# Patient Record
Sex: Male | Born: 1945
Health system: Southern US, Community
[De-identification: ages and names within clinical notes are randomized; demographics above are authoritative.]

## PROBLEM LIST (undated history)

## (undated) DIAGNOSIS — I779 Disorder of arteries and arterioles, unspecified: Secondary | ICD-10-CM

## (undated) DIAGNOSIS — I639 Cerebral infarction, unspecified: Secondary | ICD-10-CM

## (undated) DIAGNOSIS — F32A Depression, unspecified: Secondary | ICD-10-CM

## (undated) DIAGNOSIS — C9210 Chronic myeloid leukemia, BCR/ABL-positive, not having achieved remission: Principal | ICD-10-CM

## (undated) DIAGNOSIS — I2510 Atherosclerotic heart disease of native coronary artery without angina pectoris: Secondary | ICD-10-CM

## (undated) DIAGNOSIS — I214 Non-ST elevation (NSTEMI) myocardial infarction: Secondary | ICD-10-CM

## (undated) DIAGNOSIS — I255 Ischemic cardiomyopathy: Secondary | ICD-10-CM

## (undated) DIAGNOSIS — N400 Enlarged prostate without lower urinary tract symptoms: Secondary | ICD-10-CM

## (undated) DIAGNOSIS — J90 Pleural effusion, not elsewhere classified: Secondary | ICD-10-CM

## (undated) DIAGNOSIS — I472 Ventricular tachycardia, unspecified: Secondary | ICD-10-CM

## (undated) DIAGNOSIS — IMO0001 Reserved for inherently not codable concepts without codable children: Secondary | ICD-10-CM

## (undated) DIAGNOSIS — F329 Major depressive disorder, single episode, unspecified: Secondary | ICD-10-CM

## (undated) DIAGNOSIS — I493 Ventricular premature depolarization: Secondary | ICD-10-CM

## (undated) DIAGNOSIS — I739 Peripheral vascular disease, unspecified: Secondary | ICD-10-CM

## (undated) DIAGNOSIS — E785 Hyperlipidemia, unspecified: Secondary | ICD-10-CM

## (undated) DIAGNOSIS — F419 Anxiety disorder, unspecified: Secondary | ICD-10-CM

## (undated) DIAGNOSIS — D630 Anemia in neoplastic disease: Secondary | ICD-10-CM

## (undated) DIAGNOSIS — Z950 Presence of cardiac pacemaker: Secondary | ICD-10-CM

## (undated) DIAGNOSIS — D689 Coagulation defect, unspecified: Secondary | ICD-10-CM

## (undated) DIAGNOSIS — J309 Allergic rhinitis, unspecified: Secondary | ICD-10-CM

## (undated) DIAGNOSIS — D759 Disease of blood and blood-forming organs, unspecified: Secondary | ICD-10-CM

## (undated) DIAGNOSIS — I5022 Chronic systolic (congestive) heart failure: Secondary | ICD-10-CM

## (undated) DIAGNOSIS — J189 Pneumonia, unspecified organism: Secondary | ICD-10-CM

## (undated) DIAGNOSIS — I10 Essential (primary) hypertension: Secondary | ICD-10-CM

## (undated) DIAGNOSIS — A0472 Enterocolitis due to Clostridium difficile, not specified as recurrent: Secondary | ICD-10-CM

## (undated) DIAGNOSIS — G459 Transient cerebral ischemic attack, unspecified: Secondary | ICD-10-CM

## (undated) DIAGNOSIS — J439 Emphysema, unspecified: Secondary | ICD-10-CM

## (undated) DIAGNOSIS — Z95810 Presence of automatic (implantable) cardiac defibrillator: Secondary | ICD-10-CM

## (undated) HISTORY — DX: Ventricular tachycardia: I47.2

## (undated) HISTORY — DX: Transient cerebral ischemic attack, unspecified: G45.9

## (undated) HISTORY — DX: Disorder of arteries and arterioles, unspecified: I77.9

## (undated) HISTORY — DX: Enterocolitis due to Clostridium difficile, not specified as recurrent: A04.72

## (undated) HISTORY — DX: Benign prostatic hyperplasia without lower urinary tract symptoms: N40.0

## (undated) HISTORY — DX: Emphysema, unspecified: J43.9

## (undated) HISTORY — DX: Chronic myeloid leukemia, BCR/ABL-positive, not having achieved remission: C92.10

## (undated) HISTORY — DX: Anemia in neoplastic disease: D63.0

## (undated) HISTORY — DX: Ventricular tachycardia, unspecified: I47.20

## (undated) HISTORY — DX: Ventricular premature depolarization: I49.3

## (undated) HISTORY — DX: Depression, unspecified: F32.A

## (undated) HISTORY — DX: Major depressive disorder, single episode, unspecified: F32.9

## (undated) HISTORY — PX: BACK SURGERY: SHX140

## (undated) HISTORY — DX: Anxiety disorder, unspecified: F41.9

## (undated) HISTORY — DX: Essential (primary) hypertension: I10

## (undated) HISTORY — DX: Allergic rhinitis, unspecified: J30.9

## (undated) HISTORY — DX: Ischemic cardiomyopathy: I25.5

## (undated) HISTORY — DX: Hyperlipidemia, unspecified: E78.5

## (undated) HISTORY — DX: Chronic systolic (congestive) heart failure: I50.22

## (undated) HISTORY — DX: Atherosclerotic heart disease of native coronary artery without angina pectoris: I25.10

## (undated) HISTORY — DX: Peripheral vascular disease, unspecified: I73.9

---

## 1998-06-22 ENCOUNTER — Ambulatory Visit (HOSPITAL_COMMUNITY): Admission: RE | Admit: 1998-06-22 | Discharge: 1998-06-22 | Payer: Self-pay | Admitting: Family Medicine

## 1999-08-09 DIAGNOSIS — I639 Cerebral infarction, unspecified: Secondary | ICD-10-CM

## 1999-08-09 HISTORY — DX: Cerebral infarction, unspecified: I63.9

## 1999-08-09 HISTORY — PX: CAROTID ENDARTERECTOMY: SUR193

## 2000-02-11 ENCOUNTER — Encounter: Payer: Self-pay | Admitting: Vascular Surgery

## 2000-02-14 ENCOUNTER — Encounter (INDEPENDENT_AMBULATORY_CARE_PROVIDER_SITE_OTHER): Payer: Self-pay

## 2000-02-14 ENCOUNTER — Inpatient Hospital Stay: Admission: RE | Admit: 2000-02-14 | Discharge: 2000-02-15 | Payer: Self-pay | Admitting: Vascular Surgery

## 2001-08-08 HISTORY — PX: OTHER SURGICAL HISTORY: SHX169

## 2003-03-06 ENCOUNTER — Encounter: Payer: Self-pay | Admitting: Cardiology

## 2003-03-06 ENCOUNTER — Observation Stay (HOSPITAL_COMMUNITY): Admission: EM | Admit: 2003-03-06 | Discharge: 2003-03-06 | Payer: Self-pay | Admitting: *Deleted

## 2004-10-19 ENCOUNTER — Encounter: Admission: RE | Admit: 2004-10-19 | Discharge: 2004-10-19 | Payer: Self-pay | Admitting: Family Medicine

## 2009-02-05 HISTORY — PX: CORONARY ARTERY BYPASS GRAFT: SHX141

## 2009-02-18 ENCOUNTER — Ambulatory Visit (HOSPITAL_COMMUNITY): Admission: RE | Admit: 2009-02-18 | Discharge: 2009-02-18 | Payer: Self-pay | Admitting: Cardiology

## 2009-02-19 ENCOUNTER — Ambulatory Visit: Payer: Self-pay | Admitting: Thoracic Surgery (Cardiothoracic Vascular Surgery)

## 2009-02-20 ENCOUNTER — Encounter: Payer: Self-pay | Admitting: Thoracic Surgery (Cardiothoracic Vascular Surgery)

## 2009-02-20 ENCOUNTER — Ambulatory Visit (HOSPITAL_COMMUNITY)
Admission: RE | Admit: 2009-02-20 | Discharge: 2009-02-20 | Payer: Self-pay | Admitting: Thoracic Surgery (Cardiothoracic Vascular Surgery)

## 2009-02-23 ENCOUNTER — Ambulatory Visit: Payer: Self-pay | Admitting: Thoracic Surgery (Cardiothoracic Vascular Surgery)

## 2009-02-23 ENCOUNTER — Inpatient Hospital Stay (HOSPITAL_COMMUNITY)
Admission: RE | Admit: 2009-02-23 | Discharge: 2009-02-28 | Payer: Self-pay | Admitting: Thoracic Surgery (Cardiothoracic Vascular Surgery)

## 2009-03-20 ENCOUNTER — Ambulatory Visit: Payer: Self-pay | Admitting: Thoracic Surgery (Cardiothoracic Vascular Surgery)

## 2009-03-20 ENCOUNTER — Encounter
Admission: RE | Admit: 2009-03-20 | Discharge: 2009-03-20 | Payer: Self-pay | Admitting: Thoracic Surgery (Cardiothoracic Vascular Surgery)

## 2009-04-03 ENCOUNTER — Ambulatory Visit: Payer: Self-pay | Admitting: Thoracic Surgery (Cardiothoracic Vascular Surgery)

## 2009-04-03 ENCOUNTER — Encounter
Admission: RE | Admit: 2009-04-03 | Discharge: 2009-04-03 | Payer: Self-pay | Admitting: Thoracic Surgery (Cardiothoracic Vascular Surgery)

## 2010-07-08 HISTORY — PX: COLONOSCOPY: SHX174

## 2010-11-14 LAB — CBC
HCT: 27.9 % — ABNORMAL LOW (ref 39.0–52.0)
HCT: 31.3 % — ABNORMAL LOW (ref 39.0–52.0)
HCT: 36.4 % — ABNORMAL LOW (ref 39.0–52.0)
Hemoglobin: 10.9 g/dL — ABNORMAL LOW (ref 13.0–17.0)
Hemoglobin: 12.8 g/dL — ABNORMAL LOW (ref 13.0–17.0)
Hemoglobin: 9.7 g/dL — ABNORMAL LOW (ref 13.0–17.0)
MCHC: 34.7 g/dL (ref 30.0–36.0)
MCHC: 34.7 g/dL (ref 30.0–36.0)
MCV: 97.6 fL (ref 78.0–100.0)
MCV: 99 fL (ref 78.0–100.0)
Platelets: 148 10*3/uL — ABNORMAL LOW (ref 150–400)
Platelets: 164 10*3/uL (ref 150–400)
Platelets: 172 10*3/uL (ref 150–400)
RBC: 2.46 MIL/uL — ABNORMAL LOW (ref 4.22–5.81)
RBC: 3.73 MIL/uL — ABNORMAL LOW (ref 4.22–5.81)
RDW: 13 % (ref 11.5–15.5)
RDW: 13 % (ref 11.5–15.5)
RDW: 13.4 % (ref 11.5–15.5)
WBC: 14.1 10*3/uL — ABNORMAL HIGH (ref 4.0–10.5)
WBC: 14.1 10*3/uL — ABNORMAL HIGH (ref 4.0–10.5)
WBC: 8.5 10*3/uL (ref 4.0–10.5)

## 2010-11-14 LAB — APTT: aPTT: 33 seconds (ref 24–37)

## 2010-11-14 LAB — COMPREHENSIVE METABOLIC PANEL
AST: 21 U/L (ref 0–37)
BUN: 14 mg/dL (ref 6–23)
CO2: 26 mEq/L (ref 19–32)
Chloride: 105 mEq/L (ref 96–112)
Creatinine, Ser: 1.03 mg/dL (ref 0.4–1.5)
GFR calc Af Amer: >60 mL/min (ref >60)
GFR calc non Af Amer: >60 mL/min (ref >60)
Glucose, Bld: 92 mg/dL (ref 70–99)
Total Bilirubin: 0.4 mg/dL (ref 0.3–1.2)

## 2010-11-14 LAB — BASIC METABOLIC PANEL
BUN: 13 mg/dL (ref 6–23)
BUN: 20 mg/dL (ref 6–23)
CO2: 27 mEq/L (ref 19–32)
Calcium: 8.5 mg/dL (ref 8.4–10.5)
Calcium: 8.7 mg/dL (ref 8.4–10.5)
Creatinine, Ser: 1.06 mg/dL (ref 0.4–1.5)
Creatinine, Ser: 1.11 mg/dL (ref 0.4–1.5)
Creatinine, Ser: 1.16 mg/dL (ref 0.4–1.5)
GFR calc Af Amer: >60 mL/min (ref >60)
GFR calc Af Amer: >60 mL/min (ref >60)
GFR calc non Af Amer: >60 mL/min (ref >60)
GFR calc non Af Amer: >60 mL/min (ref >60)
GFR calc non Af Amer: >60 mL/min (ref >60)
Glucose, Bld: 118 mg/dL — ABNORMAL HIGH (ref 70–99)
Potassium: 4.6 mEq/L (ref 3.5–5.1)
Sodium: 140 mEq/L (ref 135–145)
Sodium: 143 mEq/L (ref 135–145)

## 2010-11-14 LAB — HEMOGLOBIN A1C: Hgb A1c MFr Bld: 5.5 % (ref 4.6–6.1)

## 2010-11-14 LAB — POCT I-STAT 3, ART BLOOD GAS (G3+)
Acid-Base Excess: 1 mmol/L (ref 0.0–2.0)
Acid-Base Excess: 3 mmol/L — ABNORMAL HIGH (ref 0.0–2.0)
Acid-base deficit: 2 mmol/L (ref 0.0–2.0)
Acid-base deficit: 2 mmol/L (ref 0.0–2.0)
Bicarbonate: 23.2 mEq/L (ref 20.0–24.0)
Bicarbonate: 23.9 mEq/L (ref 20.0–24.0)
Bicarbonate: 27.1 mEq/L — ABNORMAL HIGH (ref 20.0–24.0)
O2 Saturation: 100 %
O2 Saturation: 96 %
Patient temperature: 36.9
TCO2: 24 mmol/L (ref 0–100)
TCO2: 29 mmol/L (ref 0–100)
pCO2 arterial: 42.5 mmHg (ref 35.0–45.0)
pCO2 arterial: 40.8 mmHg (ref 35.0–45.0)
pH, Arterial: 7.365 (ref 7.350–7.450)
pO2, Arterial: 64 mmHg — ABNORMAL LOW (ref 80.0–100.0)
pO2, Arterial: 59 mmHg — ABNORMAL LOW (ref 80.0–100.0)

## 2010-11-14 LAB — URINALYSIS, ROUTINE W REFLEX MICROSCOPIC
Glucose, UA: NEGATIVE mg/dL
Hgb urine dipstick: NEGATIVE
Protein, ur: NEGATIVE mg/dL

## 2010-11-14 LAB — POCT I-STAT 4, (NA,K, GLUC, HGB,HCT)
Glucose, Bld: 102 mg/dL — ABNORMAL HIGH (ref 70–99)
Glucose, Bld: 107 mg/dL — ABNORMAL HIGH (ref 70–99)
Glucose, Bld: 108 mg/dL — ABNORMAL HIGH (ref 70–99)
HCT: 31 % — ABNORMAL LOW (ref 39.0–52.0)
HCT: 31 % — ABNORMAL LOW (ref 39.0–52.0)
HCT: 31 % — ABNORMAL LOW (ref 39.0–52.0)
Hemoglobin: 10.5 g/dL — ABNORMAL LOW (ref 13.0–17.0)
Hemoglobin: 7.8 g/dL — CL (ref 13.0–17.0)
Potassium: 3.5 mEq/L (ref 3.5–5.1)
Potassium: 3.6 mEq/L (ref 3.5–5.1)
Potassium: 5.1 mEq/L (ref 3.5–5.1)
Sodium: 136 mEq/L (ref 135–145)
Sodium: 136 mEq/L (ref 135–145)
Sodium: 141 mEq/L (ref 135–145)
Sodium: 142 mEq/L (ref 135–145)

## 2010-11-14 LAB — MAGNESIUM
Magnesium: 2.1 mg/dL (ref 1.5–2.5)
Magnesium: 2.2 mg/dL (ref 1.5–2.5)
Magnesium: 2.2 mg/dL (ref 1.5–2.5)
Magnesium: 2.5 mg/dL (ref 1.5–2.5)

## 2010-11-14 LAB — GLUCOSE, CAPILLARY
Glucose-Capillary: 100 mg/dL — ABNORMAL HIGH (ref 70–99)
Glucose-Capillary: 120 mg/dL — ABNORMAL HIGH (ref 70–99)
Glucose-Capillary: 145 mg/dL — ABNORMAL HIGH (ref 70–99)
Glucose-Capillary: 149 mg/dL — ABNORMAL HIGH (ref 70–99)
Glucose-Capillary: 85 mg/dL (ref 70–99)
Glucose-Capillary: 97 mg/dL (ref 70–99)

## 2010-11-14 LAB — BLOOD GAS, ARTERIAL
Acid-Base Excess: 3.5 mmol/L — ABNORMAL HIGH (ref 0.0–2.0)
O2 Saturation: 96.7 %
TCO2: 28.2 mmol/L (ref 0–100)
pCO2 arterial: 37.6 mmHg (ref 35.0–45.0)
pO2, Arterial: 77.6 mmHg — ABNORMAL LOW (ref 80.0–100.0)

## 2010-11-14 LAB — TYPE AND SCREEN
ABO/RH(D): A POS
Antibody Screen: NEGATIVE

## 2010-11-14 LAB — POCT I-STAT 3, VENOUS BLOOD GAS (G3P V)
Acid-Base Excess: 4 mmol/L — ABNORMAL HIGH (ref 0.0–2.0)
pCO2, Ven: 45.3 mmHg (ref 45.0–50.0)
pCO2, Ven: 46 mmHg (ref 45.0–50.0)
pO2, Ven: 34 mmHg (ref 30.0–45.0)
pO2, Ven: 35 mmHg (ref 30.0–45.0)

## 2010-11-14 LAB — POCT I-STAT, CHEM 8
BUN: 11 mg/dL (ref 6–23)
BUN: 17 mg/dL (ref 6–23)
Calcium, Ion: 1.27 mmol/L (ref 1.12–1.32)
Chloride: 108 mEq/L (ref 96–112)
Glucose, Bld: 116 mg/dL — ABNORMAL HIGH (ref 70–99)
HCT: 26 % — ABNORMAL LOW (ref 39.0–52.0)
HCT: 26 % — ABNORMAL LOW (ref 39.0–52.0)
Potassium: 4 mEq/L (ref 3.5–5.1)
TCO2: 20 mmol/L (ref 0–100)

## 2010-11-14 LAB — CREATININE, SERUM
Creatinine, Ser: 1.24 mg/dL (ref 0.4–1.5)
GFR calc Af Amer: >60 mL/min (ref >60)
GFR calc non Af Amer: 59 mL/min — ABNORMAL LOW (ref >60)
GFR calc non Af Amer: >60 mL/min (ref >60)

## 2010-11-14 LAB — PROTIME-INR: Prothrombin Time: 15.8 seconds — ABNORMAL HIGH (ref 11.6–15.2)

## 2010-11-14 LAB — ABO/RH: ABO/RH(D): A POS

## 2010-12-21 NOTE — Assessment & Plan Note (Signed)
OFFICE VISIT   Anthony Wolfe, Anthony Wolfe  DOB:  Jul 16, 1946                                        April 03, 2009  CHART #:  [id number]   The patient is a 65 year old gentleman who underwent coronary bypass  grafting x3 in July 19 for three-vessel disease with progressive angina.  He was seen back in the office on the 13th at which time he was doing  well, but he did have a small left pleural effusion.  We treated him  with a steroid taper and he now returns with a 2-week follow up chest x-  ray.  He states that he continues to do well.  He is 6 weeks out from  his surgery as of next Monday.  He has been physically active.  He  walked 2 miles this morning without any difficulty.  He does say that if  he walks up an incline, he will get short of breath but 2 miles on level  ground was not a problem.  He has not had any orthopnea and no  peripheral edema and no cough.   PHYSICAL EXAMINATION:  The patient is a well-appearing 65 year old male  in no acute distress.  His blood pressure is 111/72, pulse 64,  respirations 18, his oxygen saturation is 94% on room air.  Sternum is  stable.  Sternal incision is intact.  He has essentially equal breath  sounds bilaterally.   Chest x-ray shows a very small left pleural effusion decreased in size  from 2 weeks ago.   IMPRESSION:  The patient is doing well at this point in time.  His  activities were once again discussed, he can start gradually building  into activities.  I advised him not to lift any objects weighing greater  than 20 pounds for another 2 weeks after that it is unrestricted, but  again to build into things gradually.  He is driving.  He will continued  to be followed by Dr. Arlyce Dice and  Dr. Amil Amen.  I would be happy to see him back anytime if I could be of  any further assistance with his care.   Salvatore Decent Dorris Fetch, M.D.  Electronically Signed   SCH/MEDQ  D:  04/03/2009  T:  04/03/2009  Job:  [id number]   cc:   Teena Irani. Arlyce Dice, M.D.

## 2010-12-21 NOTE — H&P (Signed)
HISTORY AND PHYSICAL EXAMINATION   February 19, 2009   Re:  ANAND, TEJADA            DOB:  06/20/1946   REASON FOR CONSULTATION:  Left main disease.   HISTORY OF PRESENT ILLNESS:  The patient is a 65 year old gentleman with  multiple cardiac risk factors and known coronary artery disease.  He had  an MRI and occluded right coronary catheterization in 2004.  He had a  40% left main stenosis at that time.  He had a history also of left  ventricular ejection fraction of 36% with ischemic cardiomyopathy.  He  had a stress test as recently as 2007 which showed no reversible defect.   The patient now has a chief complaint of shortness of breath with  exertion.  He does get some chest tightness, it becomes sometimes with  or sometimes without exertion.  He describes it as a tightness  substernally.  It is associated with shortness of breath.  Sometimes he  will get short of breath exertion without a coming chest tightness.  He  has been exercising regularly, walking up to 3 miles a day as recently  as a few weeks ago but he has noted over the past month or two that he  has been getting increasingly fatigued.  He feels tired all the time  with heavy exertion.  He will just give out and then takes him a long  time to recover and has been having increasing duration in severity of  his chest tightness and shortness of breath.   A pharmacologic stress test was done by Dr. Amil Amen, which showed in  addition to a fixed inferior wall defect.  There also was a moderate  reversible defect in the mid basal and mid inferior walls.  Yesterday he  underwent cardiac catheterization where he was found to have total  occlusion of his right coronary which is chronic filled via the left-to-  right collaterals.  He also had a 50% distal left main stenosis.   His past medical history is significant for coronary artery disease;  previous MI; and chronic totally occluded right coronary;  atherosclerotic cardiovascular disease; PAD with mild claudication;  previous stroke; known totally occluded left carotid; previous right  carotid endarterectomy by Dr. Tawanna Cooler Early in 2001, patent as of 2 years  ago; hypertension; hyperlipidemia; depression; and arthritis.   His current medications are citalopram 20 mg daily,  lisinopril/hydrochlorothiazide 20/12.5 one tablet daily, aspirin 325 mg  daily, metoprolol 25 mg b.i.d., simvastatin 40 mg daily, fish oil 1000  mg daily, Levitra 20 mg p.r.n., Os-Cal plus D 600/200 one tablet b.i.d.   He has an allergy to sulfa.   FAMILY HISTORY:  Significant for hypertension, coronary artery disease,  but not premature coronary artery disease.   SOCIAL HISTORY:  He is married.  He is retired, lives with his wife.  He  has a 120 pack-year history of smoking, but quit 12 years ago.  He does  not drink alcohol.   REVIEW OF SYSTEMS:  The patient's medical history form is reviewed and  is on the chart.  He notes in addition to his chest pain and shortness  of breath, he does have some orthopnea but no paroxysmal nocturnal  dyspnea.  He has had a stroke.  He gets blurred vision in his left eye  when he is hot, this has been ongoing since his stroke.  He does have  dizziness with exertion, also has arthritis, some  depression.  He gets  pain in his calves, left greater than right with walking but was walking  3 miles as recently as a month ago.   LABORATORY DATA:  EKG shows poor R-wave progression as well as previous  inferior MI stressed back in catheterization as previously noted.  His  glucose was 98, BUN 13, creatinine 1.1, sodium 142, potassium 4.3.  White count 9.2, hematocrit 39, platelets 251.  PT is 14.1.   IMPRESSION:  The patient is a 65 year old gentleman with known coronary  artery disease, multiple cardiac risk factors, who presents with  increasing exertional shortness of breath and chest discomfort as well  as increasing fatigue and  lack of energy.  At catheterization, he has  chronically totally occluded right coronary as well as progression of  his left main disease which is now 50%.  He also has some other plaque  burden in his vessels, but not enough to reach hemodynamic significance.  I had a discussion with the patient and his wife regarding the  implications of left main disease in the same particularly in the  setting of totally occluded right coronary and with his progression of  symptoms, I would favor a treatment of left main disease with coronary  artery bypass grafting.  I discussed in detail with them the  indications, risks, benefits, and alternatives.  They do understand  there is a good possibility that this will not resolve all of his  symptoms, but should help at least partially with most of the symptoms.  They also understand that there is no guarantee of success and there is  a possibility of progression over time either with native coronary  artery disease or graft atherosclerosis.  I discussed with them the  risks which include but not limited to death, myocardial infarction,  stroke, deep vein thrombosis, pulmonary embolism, bleeding, possible  need for transfusions, infections as well as other organ system  dysfunction including respiratory, renal, hepatic, or gastrointestinal  complications.  He does understand that he is in increased risk for  respiratory complications given his smoking history and chronic  obstructive pulmonary disease, although given his exercise tolerance  until recently and; in fact, he does not use nebulizers or oxygen.  I  think his risk is acceptable from a pulmonary standpoint.  The patient  understands and accepts, and agrees to the procedure and wishes to  proceed as soon as possible.  We have scheduled him for Monday, February 23, 2009.  He will be admitted on the day of surgery.   Salvatore Decent Dorris Fetch, M.D.  Electronically Signed   SCH/MEDQ  D:  02/19/2009  T:   02/20/2009  Job:  [id number]   cc:   Francisca December, M.D.  Teena Irani. Arlyce Dice, M.D.

## 2010-12-21 NOTE — Assessment & Plan Note (Signed)
OFFICE VISIT   BANDY, HONAKER  DOB:  02-May-1946                                        March 20, 2009  CHART #:  [id number]   REASON FOR VISIT:  Follow up after coronary bypass grafting.   HISTORY OF PRESENT ILLNESS:  The patient is a 65 year old gentleman who  was found to have a left main stenosis and totally occluded right  coronary back in July.  He underwent coronary bypass grafting x3 on July  19.  Postoperatively he did well, did not have any significant  complications, and he has continued to do well since he was discharged  home.  He is having minimal discomfort.  He took a couple of pain pills  at home, stopped taking them and has not had any since.  He has not had  any anginal-type pain.  He has been walking, he walked half a mile this  morning without any difficulty.  His energy levels are improving.  His  blood pressure was low last week, he saw someone in Dr. Amil Amen' office  and his medications were changed.  He says he has felt better since that  time.   CURRENT MEDICATIONS:  Citalopram 20 mg daily, aspirin 325 mg daily,  metoprolol 25 mg b.i.d., simvastatin 40 mg daily, __________ 20 mg  p.r.n., lisinopril and hydrochlorothiazide was discontinued.   PHYSICAL EXAMINATION:  GENERAL:  The patient is a well appearing, 63-  year-old white male in no acute distress.  VITAL SIGNS:  Blood pressure is 113/67, pulse 69, respirations 18, his  oxygen saturation is 90% on room air.  LUNGS:  Clear with equal breath sounds bilaterally.  CARDIAC:  Regular rate and rhythm.  Normal S1 and S2.  No rubs, murmurs  or gallops.  CHEST:  His sternum is stable.  His sternal incision is well healed.  EXTREMITIES:  His leg incision is well healed.  He has no peripheral  edema.   Chest x-ray shows some small amount of effusion and atelectasis at the  left base.  There is not enough fluid there for thoracentesis.   IMPRESSION:  The patient is a 65 year old  gentleman, he is status post  coronary bypass grafting about 3-1/2 weeks ago.  He is doing very well  at this point in time.  He is having minimal discomfort.  His exercise  tolerance is good and he is very motivated.  He does have a small left  effusion.  His diuretics were stopped because of hypotension, I do not  want to put him back on a diuretic but I am going to give him a  prednisone taper for that.  I will plan to see him back in about 2 weeks  with a repeat chest x-ray just to check on his progress.  He may begin  driving.  He is still not to lift any objects weighing greater than 10  pounds for at least another 2 weeks.  As stated, I will plan to see him  back at that time.   Salvatore Decent Dorris Fetch, M.D.  Electronically Signed   SCH/MEDQ  D:  03/20/2009  T:  03/20/2009  Job:  [id number]   cc:   Teena Irani. Arlyce Dice, M.D.

## 2010-12-21 NOTE — Discharge Summary (Signed)
NAME:  Anthony Wolfe, Anthony Wolfe NO.:  [id number]   MEDICAL RECORD NO.:  [id number]          PATIENT TYPE:  INP   LOCATION:  2017                         FACILITY:  [facility]   PHYSICIAN:  Salvatore Decent. Dorris Fetch, M.D.DATE OF BIRTH:  [birth year]   DATE OF ADMISSION:  02/23/2009  DATE OF DISCHARGE:                               DISCHARGE SUMMARY   FINAL DIAGNOSIS:  Left main and 3-vessel coronary artery disease with  progressive angina.   IN-HOSPITAL DIAGNOSES:  1. Acute blood loss anemia postoperatively.  2. Volume overload postoperatively.   SECONDARY DIAGNOSES:  1. History of coronary artery disease status post myocardial      infarction with chronic totally occluded right coronary artery.  2. Atherosclerotic cardiovascular disease.  3. Peripheral vascular disease with mild claudication.  4. History of cerebrovascular accident with known totally occluded      left carotid artery.  5. Status post right carotid endarterectomy done by Dr. Arbie Cookey in 2001.  6. Hypertension.  7. Hyperlipidemia.  8. Depression.  9. Arthritis.   IN-HOSPITAL OPERATIONS AND PROCEDURES:  Coronary artery bypass grafting  x3 using a left internal mammary artery to left anterior descending,  saphenous vein graft to obtuse marginal 1, saphenous vein graft to  posterior descending, and endoscopic vein harvesting from left leg.   HISTORY AND PHYSICAL AND HOSPITAL COURSE:  Mr. Hardgrove is a 65 year old  gentleman with known coronary artery disease who presents with 1 to 2-  month history of progressive exertional dyspnea and chest discomfort.  After cardiac catheterization, he was found to have chronically totally  occluded right coronary artery and a 50% left main stenosis.  The  patient was seen and evaluated by Dr. Dorris Fetch.  Dr. Dorris Fetch  discussed with the patient undergoing coronary artery bypass grafting.  He discussed risks and benefits with the patient.  The patient  acknowledged  understanding and agreed to proceed.  Surgery was scheduled  for February 23, 2009.  For details of the patient's past medical and  history and physical exam, please see dictated H and P.   The patient was taken to the operating room on February 23, 2009, where he  underwent coronary artery bypass grafting x3 using a left internal  mammary artery to left anterior descending, saphenous vein graft to  obtuse marginal 1, and saphenous vein graft to posterior descending.  Endoscopic vein harvesting was done from the left leg.  The patient  tolerated this procedure well and was transferred to the Intensive Care  Unit in stable condition.  Postoperatively, the patient noted to be  hemodynamically stable.  He was extubated evening of surgery.  Post-  extubation, the patient was noted to be alert and oriented x4 and neuro  intact.  The patient's postoperative course was pretty much  unremarkable.  He was noted to be in normal sinus rhythm  postoperatively.  Blood pressure stable.  All drips were weaned and  discontinued.  The patient was restarted on his lisinopril/HCTZ as well  as low-dose beta-blocker.  The patient remained in normal sinus rhythm.  Blood pressure remained stable.  Postoperatively, chest  x-ray obtained  day 1 was stable.  The patient had minimal drainage from chest tubes and  chest tubes were discontinued in normal fashion.  The patient had been  placed on nasal cannula post extubation.  He was encouraged to use his  incentive spirometer.  Follow up chest x-ray remained stable.  The  patient was able to be weaned and extubated from O2 prior to discharge  to home.  Postoperatively, the patient did have some mild acute blood  loss anemia.  His hemoglobin and hematocrit were followed closely.  He  did not require any blood transfusions postoperatively.  Most recent  hemoglobin and hematocrit were 8.8 and 25.6.  The patient had some mild  volume overload postoperatively.  He was started on  diuretics.  Daily  weights were obtained.  The patient remains 4 kg above his preoperative  weight.   The patient was transferred out of the Intensive Care Unit to [facility] postop  day #2.  He was up ambulating well with cardiac rehab and on his own.  He was tolerating diet well.  All incisions were noted to be clean, dry,  and intact and healing well.  On postop day #3, February 26, 2009, the  patient is noted to be afebrile.  He remains in normal sinus rhythm.  Blood pressure stable.  He was weaned off oxygen saturating greater than  90% on room air.  His most recent lab work shows white blood cell count  12.0, hemoglobin of 8.8, hematocrit 25.6, and platelet count 172.  Sodium of 140, potassium 3.5, chloride of 103, bicarbonate 29, BUN of  20, creatinine 1.16, and glucose 118.  AP and lateral chest x-ray done  today showed small bilateral pleural effusions.  The patient is  tentatively ready for discharge home in the next 24-48 hours pending, if  he remained stable.   FOLLOWUP APPOINTMENTS:  A followup appointment has been arranged with  Dr. Dorris Fetch for March 20, 2009, at 11:45 a.m.  The patient will  need to obtain AP and lateral chest x-ray 30 minutes prior to this  appointment.  The patient will need to follow up with Dr. Amil Amen in 2  weeks.  He will need to contact his office to make these arrangements.   ACTIVITY:  The patient instructed no driving until released to do so, no  heavy lifting over 10 pounds.  He is told to ambulate 3-4 times per day,  progress as tolerated.   INCISIONAL CARE:  The patient is told to shower, washing his incisions  using soap and water.  He is to contact the office if he develops any  drainage or opening from any of his incision sites.   DIET:  The patient educated on diet to be low-fat, low-salt.   DISCHARGE MEDICATIONS:  1. Celexa 20 mg daily.  2. Lisinopril/HCTZ 20/25 mg daily.  3. Aspirin 325 mg daily.  4. Simvastatin 40 mg daily.  5. Fish  oil 1000 mg 3 tablets daily.  6. Calcium and vitamin D daily.  7. Lopressor 12.5 mg b.i.d.  8. Lasix 40 mg daily x1 week.  9. Potassium chloride 20 mEq daily x1 week.  10.Oxycodone 5 mg 1-2 tabs q.4-6 h p.r.n. pain.      Sol Blazing, PA      Salvatore Decent. Dorris Fetch, M.D.  Electronically Signed    KMD/MEDQ  D:  02/26/2009  T:  02/27/2009  Job:  [id number]   cc:   Francisca December, M.D.  Teena Irani. Arlyce Dice, M.D.

## 2010-12-21 NOTE — Op Note (Signed)
NAME:  Anthony Wolfe, Anthony Wolfe NO.:  [id number]   MEDICAL RECORD NO.:  [id number]          PATIENT TYPE:  INP   LOCATION:  2308                         FACILITY:  [facility]   PHYSICIAN:  Salvatore Decent. Dorris Fetch, M.D.DATE OF BIRTH:  22-Sep-1945   DATE OF PROCEDURE:  02/23/2009  DATE OF DISCHARGE:                               OPERATIVE REPORT   PREOPERATIVE DIAGNOSIS:  Left main and three-vessel coronary disease  with progressive angina.   POSTOPERATIVE DIAGNOSIS:  Left main and three-vessel coronary disease  with progressive angina.   PROCEDURES:  Median sternotomy, extracorporeal circulation, coronary  bypass grafting x3 (left internal mammary artery to left anterior  descending, saphenous vein graft obtuse marginal 1, saphenous vein graft  to posterior descending), and endoscopic vein harvest, left leg.   SURGEON:  Salvatore Decent. Dorris Fetch, MD   ASSISTANT:  Stephanie Acre. Dasovich, PA   ANESTHESIA:  General.   FINDINGS:  Posterior descending branch of the right coronary 1-mm fair  quality vessel.  OM-1 high.  Anterolateral branch 1.5, good quality.  LAD 1.5, good quality.  Mild plaquing of the LAD just proximal to the  anastomosis.   Left internal mammary artery, good-quality.  Saphenous vein from the  left leg, good quality.  Saphenous vein and right leg, bifurcated mid  thigh, not harvested.   CLINICAL NOTE:  Anthony Wolfe is a 65 year old gentleman with known  coronary disease who presents with a 1-2 month history of progressive  exertional dyspnea and chest discomfort.  At catheterization, he has a  chronically totally occluded right coronary and a 50% left main  stenosis.  The patient was advised to undergo coronary artery bypass  grafting.  The indications, risks, benefits, and alternatives were  discussed in detail with the patient.  He understood and accepted the  risks and agreed to proceed.   OPERATIVE NOTE:  Anthony Wolfe was brought to the preop holding area on  February 23, 2009.  There the Anesthesia Service under direction of Dr.  Sheldon Silvan placed a Swan-Ganz catheter and arterial blood pressure  monitoring lines as well as establishing intravenous access.  Intravenous antibiotics were administered.  The patient was taken to the  operating room, anesthetized, and intubated.  A Foley catheter was  placed.  The chest, abdomen, and legs were prepped and draped in the  usual fashion.  An incision was made in the medial aspect of the right  leg at the level where the greater saphenous vein was identified.  Initial attempted dissections were performed, but the vein bifurcated  mid thigh and became relatively small in both branches.  Decision was  made to inspect the vein on the left side, which turned out to be a  better quality vein.  The right leg vein was not harvested.   The saphenous vein was harvested from the left leg from upper calf to  groin endoscopically.  Simultaneously, a median sternotomy was performed  and the left internal mammary artery was harvested using a standard  technique.  A 2000 units of heparin was administered during the vessel  harvest and remaining full  heparin dose was given prior to opening the  pericardium.   The pericardium was opened.  The ascending aorta was inspected.  There  was no atherosclerotic disease.  The aorta was cannulated via concentric  2-0 Ethibond pledgeted purse-string sutures.  A dual-stage venous  cannula was placed via purse-string suture in the right atrial  appendage.  Cardiopulmonary bypass was instituted and the patient was  cooled to 32 degrees Celsius.  The coronary arteries were inspected and  anastomotic sites were chosen.  The conduits were inspected and cut to  length.  A foam pad was placed in the pericardium to insulate the heart.  A temperature probe was placed in the myocardial septum and a  cardioplegic cannula was placed in the ascending aorta.   The aorta was cross-clamped.  The  left ventricle was emptied via the  aortic root vent.  Cardiac arrest was achieved with combination of cold  antegrade blood cardioplegia and topical iced saline.  A 1 L of  cardioplegia was administered.  The myocardial temperature fell to 10  degrees Celsius and the following distal anastomoses were performed.   First, reverse saphenous vein graft was placed end-to-side to the  posterior descending branch of the right coronary.  The vein graft was  of good quality.  The coronary was a 1-mm fair quality target.  Probe  passed easily proximally and distally.  At the completion anastomosis,  cardioplegia was administered.  Flow was adequate and as expected with a  small target vessel.  There was good hemostasis.   Next, a reverse saphenous vein graft was placed end-to-side to the first  obtuse marginal branch of the left circumflex.  This was a high  anterolateral branch, almost an intermediate branch.  There was a 1.5-mm  good-quality target.  The vein graft was of good quality.  It was  anastomosed end-to-side with a running 7-0 Prolene suture.  Again, a  probe passed easily proximally and distally.  Cardioplegia was  administered.  There was good flow and good hemostasis.   Next, the left internal mammary artery was brought through a window in  the pericardium.  The distal end was beveled and was anastomosed end-to-  side to the distal LAD.  The mammary was a 2-mm good-quality conduit.  The LAD was a 1.5-mm good-quality target.  The anastomosis was performed  with a running 8-0 Prolene suture in an end-to-side fashion.  At the  completion of the mammary to LAD anastomosis, the bulldog clamps were  briefly removed to inspect for hemostasis.  Immediate and rapid septal  rewarming was noted.  The bulldog clamps were placed and the mammary  pedicle was tacked to the epicardial surface of the heart with 6-0  Prolene sutures.  Additional cardioplegia was administered.   The vein grafts  were cut to length.  The cardioplegic cannulas were  removed from the ascending aorta.  The proximal vein graft anastomoses  were performed with 4.5 mm punch aortotomies with running 6-0 Prolene  sutures placed in the final proximal anastomosis.  The patient was  placed in Trendelenburg position.  Lidocaine was administered.  The  aortic root was de-aired and the aortic cross-clamp was removed.  Total  cross-clamp time was 58 minutes.  The patient required defibrillation  with 10 joules and then was in sinus rhythm thereafter.  While the  patient was being rewarmed all proximal and distal anastomoses were  inspected for hemostasis.  Epicardial pacing wires were placed on the  right ventricle and right atrium.  When the patient rewarmed to a core  temperature of 37 degrees Celsius, he was weaned from cardiopulmonary  bypass on the first attempt.  He was paced at 90 beats per minute and on  no inotropic support.  Total bypass time was 89 minutes.  The initial  cardiac index was greater than 2 L/min/m2.  The patient remained  hemodynamically stable throughout the post-bypass period.   Test dose of protamine was administered and was well tolerated.  The  atrial and aortic cannulae were removed.  The remaining of the protamine  was administered without incident.  The chest irrigated with 1 L of warm  normal saline including 1 gram of vancomycin and hemostasis was  achieved.  The left pleural and single mediastinal chest tubes were  placed through separate subcostal incisions.  The pericardium was  reapproximated with interrupted 3-0 silk sutures that came together  easily without tension.  The sternum was closed with a combination of  single and double heavy gauge stainless steel wires.  The pectoralis  fascia, subcutaneous tissue, and skin were closed in standard  fashion.  All sponge, needle, and instrument counts were correct at the  end of procedure.  There were no intraoperative  complications and the  patient was taken from the operating room to the Surgical Intensive Care  Unit in stable condition.      Salvatore Decent Dorris Fetch, M.D.  Electronically Signed     SCH/MEDQ  D:  02/23/2009  T:  02/24/2009  Job:  [id number]   cc:   Francisca December, M.D.  Teena Irani. Arlyce Dice, M.D.

## 2010-12-24 NOTE — Discharge Summary (Signed)
[facility]  Patient:    Anthony Wolfe, Anthony Wolfe                         MRN: [id number] Adm. Date:  02/14/00 Disc. Date: 02/15/00 Attending:  Larina Earthly, M.D. Dictator:   Loura Pardon, P.A. CC:         Dr. Raquel Sarna, [address]*[place], [place]                           Discharge Summary  DATE OF BIRTH:  04/10/1946.  SURGEON:  Larina Earthly, M.D.  PRIMARY CAREGIVER:  Dr. Regino Schultze.  FINAL DIAGNOSIS: 1.  Known extracranial cerebrovascular occlusive disease. 2.  Known left internal carotid artery occlusion. 3.  Severe right internal carotid artery stenosis.  SECONDARY DIAGNOSIS: 1.  Hypertension. 2.  Depression. 3.  Benign prostatic hypertrophy. 4.  History of cataracts. 5.  Chronic obstructive pulmonary disease/emphysema. 6.  History of tobacco habituation. 7.  Status post back surgery times three. 8.  Status post hemorrhoidectomy.  PROCEDURES:  July 9th, 2001, right carotid endarterectomy with dacryon patch angioplasty, intraoperative shunting, Dr. Tawanna Cooler Early.  Anthony Wolfe tolerated the procedure well and was extubated in the operating room without difficulty and transferred in stable satisfactory condition to the recovery room.  DISCHARGE DISPOSITION:  Anthony Wolfe was a suitable candidate for discharge on postoperative day number one.  He awoke in the recovery room with no focal or regional neurologic deficits.  He was at his baseline neurologic examination.  He was moving both upper and lower extremities appropriately. His speech was clear.  He had no difficulty swallowing.  His mental status was also clear.  In the immediate postoperative period he remained afebrile. Vital signs were stable.  His incision was healing nicely without evidence of erythema, drainage, or swelling.  He remained afebrile postoperatively.  His pain was controlled with oral analgesia.  His appetite had returned.  He was free of nausea.  He had full  gastrointestinal tract function before discharge.  DISCHARGE MEDICATIONS:  He goes home on the following medications: 1.  Percocet 5/325 1-2 tabs po q. 4-6 hours prn pain. 2.  Diovan 80 mg daily. 3.  Celexa 20 mg daily. 4.  Folic acid 400 mg daily. 5.  Vitamin E 400 international units daily. 6.  Enteric aspirin 325 mg daily. 7.  Multivitamin daily.  DISCHARGE ACTIVITY:  Ambulation as tolerated.  He is asked not to drive for two weeks.  DIET:  Low sodium, low cholesterol diet.  WOUND CARE:  He may bathe daily, keeping his incision clean and dry beginning Wednesday, July 11th.  He is to sponge bathe until that time.  FOLLOW-UP:  He has a follow up office visit with Dr. Arbie Cookey on July 26th, 2001 at 8:50 in the morning.  BRIEF HISTORY:  Anthony Wolfe is a 65 year old male referred by Dr. Corine Shelter for his extracranial cerebrovascular occlusive disease.  It was first evaluated in 1999 by Dr. Arbie Cookey.  At that time he was experiencing right hand and leg numbness which lasted several minutes but quickly resolved.  Since then his condition has been followed by ultrasound studies.  Originally ultrasound demonstrated a total left internal carotid artery stenosis and a greater than 40-60% right internal carotid artery stenosis.  He was seen once again in January, 2001 with no significant changes, however at a six month visit in  June of 2001 his right internal carotid artery stenosis was now severe.  A right carotid endarterectomy was recommended to reduce the risk of stroke.  Upon examination prior to surgery he denies any weakness, paresthesias, facial droop, difficulty standing or walking, or falling.  He denies any impaired speech.  He has no history of headaches, nausea, vomiting, or vertigo.  He does state that he has episodes of dizziness about twice a week.  These are not new.  He presents for right carotid endarterectomy on July 9th.  HOSPITAL COURSE:  Essentially as in discharge  disposition.  Anthony Wolfe presented to [facility] July 9th.  He underwent right carotid endarterectomy with Dacryon patch angioplasty the same day.  He tolerated the procedure well.  He awoke in the recovery room, extubated in the operating room with no focal or regional neurologic deficits.  He was moving upper and lower extremities appropriately.  His speech was clear.  In the postoperative period he has remained afebrile.  He has experienced no cardiac dysrhythmia, no respiratory compromise.  His pain is controlled well with oral analgesia and his incision is healing nicely.  He is ambulating independently, taking oral nourishment, tolerating it.  He is judged a suitable candidate for discharge on postoperative day number one, July 10th. DD:  02/14/00 TD:  02/14/00 Job: [id number]

## 2010-12-24 NOTE — H&P (Signed)
NAME:  Anthony Wolfe, Anthony Wolfe                          ACCOUNT NO.:  [id number]   MEDICAL RECORD NO.:  [id number]                   PATIENT TYPE:  EMS   LOCATION:  MAJO                                 FACILITY:  [facility]   PHYSICIAN:  Olga Millers, M.D.                DATE OF BIRTH:  1945-09-26   DATE OF ADMISSION:  03/06/2003  DATE OF DISCHARGE:                                HISTORY & PHYSICAL   CHIEF COMPLAINT:  Chest pain and right arm pain.   HISTORY OF PRESENT ILLNESS:  Mr. Madero is a 65 year old male with no known  history of coronary artery disease.  He was seen in the office by Dr. Olga Millers in March 2004, and stress test as indicated based on his symptoms.  He did not have it performed at that time.  He eventually did have the  stress test performed, and it was abnormal showing substantial left  ventricular dilatation and dysfunction and inferior scarring as well as  possible inferior ischemia and EF of 36%.  Because of this, he was scheduled  for catheterization.   On the day before the cardiac catheterization, he began having right  shoulder pain which he described as pain he has had before secondary to  bursitis.  He used over-the-counter medications with no help.  The symptoms  worsened.  By the day of the cardiac catheterization, he was barely able to  move his right arm, and the pain was radiating down into his right chest.  He came to short stay where he was evaluated, found to have chest pain, and  sent to the emergency room.   Mr. Hommes has had these symptoms before but never this bad. He states they  began yesterday without any particular activity.  He states that he has  severe pain whenever he tries to raise his right arm either sideways or  forward.  If he is lying perfectly still, he does not have any pain at all.   PAST MEDICAL HISTORY:  1. Treated hypertension.  2. Treated hyperlipidemia.  3. He has a greater than 80-pack-year history of tobacco use,  quit in 2001.  4. History of palpitations and has had a Holter monitor, but the results are     not available at this time.  5. He was anemic upon evaluation in physician's office and took iron pills     at one point.  6. History of COPD.  7. History of bursitis in right shoulder.  8. 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   PAST SURGICAL HISTORY:  1. Back surgery x 3.  2. Right carotid endarterectomy.   ALLERGIES:  No known drug allergies.   MEDICATIONS:  1. Multivitamins daily.  2. Aspirin 325 mg daily.  3. Folic acid daily.,  4. Vitamin B 400 international units daily.  5. Calcium 600 mg daily.  6. Iron every  other day.  7. Diovan HCT 160/12.5 daily.  8. Crestor 10 mg daily.  9. Clonazepam 0.5 mg q.h.s.  10.      Allegra 180 mg p.r.n.  11.      K-Plus daily over-the-counter.   SOCIAL HISTORY:  He lives in [place] with his wife.  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  He has  greater than 80-pack-year history of tobacco use and quit in 2001.  He does  not abuse alcohol or drugs.   FAMILY HISTORY:  His mother is alive at 15 years old with no coronary artery  disease.  His father died at age 71 with history of ETOH.  He has five  sisters, and none of them have coronary artery disease.   REVIEW OF SYSTEMS:  Significant for chest pain as described above.  He has a  history of dyspnea on exertion and some chest burning which is the reason  they obtained the stress test.  He has a history of palpitations but no  syncope or presyncope.  He coughs occasionally and wheezes occasionally.  He  has chronic arthralgias and some joint pain.  He denies any GI symptoms.  Review of Systems is otherwise negative.   PHYSICAL EXAMINATION:  VITAL SIGNS:  Temperature 98.5, blood pressure  143/82, heart rate 92, respirations 18.  GENERAL:  He is a well-developed, middle-age white male in no acute  distress.   HEENT:  Head is normocephalic and atraumatic.  Pupils are equal, round, and  reactive to light and accommodation.  Extraocular movements intact.  Sclerae  clear.  Nose without discharge.  NECK:  Supple, without lymphadenopathy.  Scar is visible on the right side  of his neck.  No bruits are appreciated.  CARDIOVASCULAR:  Regular rate and rhythm.  S1 and S2.  No significant  murmur, rub, or gallop appreciated.  SKIN:  No rash or lesions.  ABDOMEN:  Soft and nontender with active bowel sounds and no  hepatosplenomegaly noted.  EXTREMITIES:  No cyanosis, clubbing, or edema.  Distal pulses are 2+ in all  four extremities, but a left femoral bruit is appreciated.  MUSCULOSKELETAL:  No joint deformity, effusions, or spinal CVA tenderness.  NEUROLOGIC: Alert and oriented.  Cranial nerves II-XII grossly intact.  Strength 5/5 in all extremities.   LABORATORY DATA:  Chest x-ray pending.   EKG:  Heart rate 84 beats per minute in sinus rhythm.  There is a  nonspecific intraventricular conduction delay, possibly an incomplete left  bundle branch block seen which is unchanged from previous EKG.   Labs pending at time of dictation.   ASSESSMENT AND PLAN:  1. Chest pain was extremely atypical, but his previous symptoms were more     typical, and he has had an abnormal Cardiolite.  He will be catheterized     today as planned.  2.     Right shoulder pain:  This will be treated with NSAIDs and pain medication     on p.r.n. basis.  Further evaluation and possible orthopedic evaluation     will be deferred to outpatient.  3. Hyperlipidemia, hypertension:  Treated per his primary care physician and     will be followed.      Lavella Hammock, P.A. [facility]                  Olga Millers, M.D.    RG/MEDQ  D:  03/06/2003  T:  03/06/2003  Job:  [id number]   cc:   Clydie Braun L. Hal Hope, M.D.  [address] [place]  [place] [address]  Fax: [phone]

## 2010-12-24 NOTE — H&P (Signed)
[facility]  Patient:    Anthony Wolfe, Anthony Wolfe                         MRN: [id number] Adm. Date:  02/14/00 Attending:  Larina Earthly, M.D. Dictator:   Marlowe Kays, P.A.                         History and Physical  REFERRING PHYSICIAN:  The referring physician was originally Dr. Corine Shelter.  He is now referred by Dr. Harvie Bridge.  CHIEF COMPLAINT:  Right carotid artery stenosis.  HISTORY OF PRESENT ILLNESS:  A 65 year old white male referred by Dr. Corine Shelter for evaluation of his CVOD, first evaluated in 1999, as he experienced right hand and leg numbness, lasting several minutes, quickly resolved.  Since then, his condition was followed by ultrasound.  He originally had shown, by ultrasound, a left ICA occlusion and more than 40-60% right ICA stenosis.  He was seen again in January 2001, with no significant changes.  However, at his visit in June 2001, his right ICA stenosis was now severe.  A right CEA was recommended to reduce the risk of stroke.  He denies any weakness, paresthesias, facial droop, difficulty standing or walking, or falling.  He denies any impaired speech.  No headache, nausea, vomiting, or vertigo.  He does have episodes of dizziness about twice a week, which are not new.  He experiences blurred vision in the left eye, "only when I get hot," this blurred vision quickly resolves after going into a cooler environment.  No ______, no seizures, no dysphasia, confusion, or memory loss.  PAST MEDICAL HISTORY: 1. Cerebrovascular obstructive disease. 2. Hypertension. 3. Depression. 4. Benign prostatic hypertrophy. 5. History of emphysema. 6. History of tobacco abuse.  SURGERIES: 1. Status post back surgery x 3, the last procedure in 1983 per patient    report, no records. 2. Status post hemorrhoidectomy in 1994.  MEDICATIONS: 1. Celexa 20 mg 1 p.o. q.d. 2. Diovan 80 mg 1 p.o. q.d. 3. Folic acid 400 mg 1 p.o. q.d. 4. Vitamin E 4000 units  q.d. 5. Enteric coated aspirin 325 mg p.o. q.d. 6. Multivitamins p.o. q.d.  ALLERGIES:  No known drug allergies.  REVIEW OF SYSTEMS:  See HPI and past medical history for significant positives.  Otherwise the patient denies any diabetes, kidney disease, asthma, CAD, angina, arrhythmia, or CHF.  No PE or DVT.  The rest of the review of systems is essentially negative.  FAMILY HISTORY:  Mother is alive and well.  Father died at 53 of "heart attack from drinking."  The rest of the family history is noncontributory.  SOCIAL HISTORY:  Married, two children.  He is a Conservation officer, historic buildings.  He chews tobacco.  He used to smoke up to four packs a day of tobacco for forty years.  He quit in 1999.  He denies any alcohol intake.  PHYSICAL EXAMINATION:  GENERAL:  Well-developed, well-nourished 65 year old white male in no acute distress, alert and oriented x 3.  VITAL SIGNS:  Blood pressure 150/90 bilaterally, pulse 60, respirations 16.  HEENT:  Normocephalic, atraumatic.  PERRLA.  EOMI.  There is a right cataract visible.  NECK:  Supple.  No JVD.  There is a loud bruit on the right.  No thyromegaly or lymphadenopathy.  CHEST:  Symmetrical on inspirations with lung clear to auscultation bilaterally, with the exception of decreased breath sounds in the left lower lobe.  CARDIOVASCULAR:  Regular rate and rhythm without murmurs, rubs, or gallops. There is an occasional ectopic beat.  ABDOMEN:  Soft, nontender.  Bowel sounds x 4.  No hepatosplenomegaly or masses palpable, or abdominal bruits.  GU/RECTAL:  Deferred.  EXTREMITIES:  No clubbing, cyanosis, or edema.  SKIN:  Reveals no ulcerations.  Normal hair pattern.  Warm temperature.  PERIPHERAL PULSES:  Carotid 2+ bilaterally with a loud bruit on the right, no bruit on the left.  Femoral 2+ bilaterally without bruits.  Popliteal, dorsalis pedis, and posterior tibialis 2+ bilaterally.  NEURO:  Within normal.  Normal gait.  The  DTRs are 2+ bilaterally.  Muscle strength is 5/5.  ASSESSMENT/PLAN:  A 65 year old white male with a known history of CVOD, who will undergo right CEA on February 14, 2000, secondary to right ICA stenosis, severe.  Dr. Arbie Cookey has seen and evaluated this patient prior to admission  and has explained the risks and benefits involving the procedure and the patient has agreed to continue.  DD:  02/11/00 TD:  02/11/00 Job: [id number]

## 2010-12-24 NOTE — Op Note (Signed)
[facility]  Patient:    Anthony Wolfe, Anthony Wolfe                       MRN: [id number] Proc. Date: 02/14/00 Adm. Date:  [id number] Disc. Date: [id number] Attending:  Alyson Locket                           Operative Report  PREOPERATIVE DIAGNOSIS:  Severe asymptomatic right internal carotid artery stenosis.  POSTOPERATIVE DIAGNOSIS:  Severe asymptomatic right internal carotid artery stenosis.  OPERATION: Right carotic endarterectomy and Dacron patch angioplasty.  SURGEON:  Larina Earthly, M.D.  ASSISTANT:  Loura Pardon, P.A.  ANESTHESIA:  General endotracheal.  COMPLICATIONS:  None.  DISPOSITION: To recovery room, stable.  DESCRIPTION OF OPERATION:  Patient was taken to the operating room and positioned with the area of right neck prepped and draped in the usual sterile fashion.  Incision was made anterior to the sternocleidomastoid and carried down through the platysma with electrocautery.  The sternocleidomastoid was defected posteriorly and the carotid sheath was opened.  The facial vein was ligated with 2-0 silk ties and divided.  Common carotid artery was encircled with an umbilical tape and Rumel tourniquet.  The vagus nerve was identified and preserved.  The patient had a large ansa hich was traced up to the level of the hypoglossal nerve and the ansa and the hypoglossal nerves were preserved.  The internal carotid artery was located with the vessel loop above the bifurcation.  The external carotid artery was encircled with the blue vessel loop and the internal carotid encircled with umbilical tape and Rumel tourniquet.  Superior thyroid artery was encircled with a 3-0 silk tie. Patient was given 7000 units of continuous heparin.  After adequate circulation time, the internal, external and common carotid arteries were occluded.  The common carotid artery was opened with 11 blade. Incision made with Potts scissors through the plaque on into the  internal carotid with Potts scissors.  A tented shunt was passed up the internal carotid and allowed to backbleed, then down the common carotid where it was secured with Rumel tourniquet.  Common carotid artery plaque was divided proximally with Potts scissors.  Endarterectomy was continued to the bifurcation.  The external carotid was endarterectomized with the eversion technique; the internal carotid was endarterectomized in an open fashion.  The remaining atheromatous debris removed from endarterectomy plane. A Hemashield Finesse Dacron patch was brought onto the field and sewn as a patch angioplasty with a running 6-0 Prolene suture.  Prior to completion of the closure, the shunt was removed and the usual flushing measures were undertaking.  The anastomosis was completed and the external followed by the common carotid, and finally the internal carotid artery occlusion clamp was removed.  Excellent flow characteristics were noted with hand-held Dopplers in the internal and external carotid arteries.  Patient was given 50 mg of protamine to reverse the heparin.  The wounds were irrigated with saline.  Hemostasis with electrocautery.  Wounds were closed with 3-0 Vicryl in the subcutaneous tissue and subcuticular tissue.  The platysma was closed with a running 3-0 Vicryl suture and the skin was closed with a 4-0 subcuticular Vicryl stitch.  Sterile dressing was applied and the patient was taken to the recovery room in stable condition. DD:  02/14/00 TD:  02/14/00 Job: [id number]

## 2010-12-24 NOTE — Cardiovascular Report (Signed)
NAME:  Anthony Wolfe, Anthony Wolfe                          ACCOUNT NO.:  [id number]   MEDICAL RECORD NO.:  [id number]                   PATIENT TYPE:  INP   LOCATION:  1828                                 FACILITY:  [facility]   PHYSICIAN:  Salvadore Farber, M.D.             DATE OF BIRTH:  Aug 18, 1945   DATE OF PROCEDURE:  03/06/2003  DATE OF DISCHARGE:                              CARDIAC CATHETERIZATION   PROCEDURE:  Left heart catheterization, left ventriculography, coronary  angiography.   INDICATIONS:  [name] is a 65 year old gentleman without prior history  of cardiac disease.  However, he has multiple risk factors and known  cerebrovascular disease.  He presents with dyspnea.  Exercise tolerance test  suggested inferior infarction with peri-infarct ischemia and EF of  approximately 36%.  He was, therefore, referred for diagnostic angiography.   PROCEDURAL TECHNIQUE:  Informed consent was obtained.  Under 1% lidocaine  local anesthesia, a 6-French sheath was placed in the right femoral artery  using the modified Seldinger technique.  Diagnostic angiography and  ventriculography were performed using JL4, JR4, and pigtail catheters.  The  patient tolerated the procedure well and was transferred to the holding room  in stable condition.  Sheaths are to be removed there.   COMPLICATIONS:  None.   FINDINGS:  1. LV:  96/13/25.  EF 45% with severe posterobasal hypokinesis and diffuse     mild hypokinesis.  2. Left main:  Short vessel with focal 40% stenosis distally.  3. LAD:  Large vessel giving rise to a single diagonal.  There are luminal     irregularities in the proximal vessel but no significant stenosis.  4. Circumflex:  Large vessel giving rise to two obtuse marginal branches.     It is angiographically normal.  5. RCA:  Moderate-sized dominant vessel.  It is occluded proximally over     approximately a 15- to 20-mm span reconstituting before an acute     marginal.  The distal  RCA is supplied both via faint bridging collaterals     and modest collateralization from the left system.   IMPRESSION AND RECOMMENDATIONS:  1. A 40% left main stenosis.  2. Total occlusion of the right coronary artery with collateralization both     from the right and the left.  3. Mildly impaired left ventricular systolic function.   Will plan continued medical therapy with the addition of carvedilol.  This  will be started at low dose.  The patient will follow up with Dr. Jens Som  in two weeks' time for up-titration.  ARB will be continued.                                               Salvadore Farber, M.D.    WED/MEDQ  D:  03/06/2003  T:  03/06/2003  Job:  [id number]   cc:   Olga Millers, M.D.   Marcos Eke. Hal Hope, M.D.  [address] [place]  [place] [address]  Fax: [phone]

## 2012-05-02 ENCOUNTER — Encounter: Payer: Self-pay | Admitting: Oncology

## 2012-05-02 ENCOUNTER — Telehealth: Payer: Self-pay | Admitting: Oncology

## 2012-05-02 DIAGNOSIS — D72820 Lymphocytosis (symptomatic): Secondary | ICD-10-CM | POA: Insufficient documentation

## 2012-05-02 NOTE — Patient Instructions (Addendum)
1.  Issue:  Leukocytosis (elevated white blood cell WBC counts). 2.  Possibilities:  Reactive process vs. Infection vs. Leukemia (either chronic leukemia such as CML or acute such as AML). 3.  Work up:  Bone marrow biopsy.  4.  Treatment:   *  If chronic myeloid leukemia CML:  Oral chemotherapy; very well tolerated.  *  If acute myeloid leukemia AML:  Then referral to a 3rd center such as [facility], [facility], or [facility] for treatment options including induction IV chemotherapy followed by possible bone marrow transplant.

## 2012-05-02 NOTE — Telephone Encounter (Signed)
C/D 05/02/12 for appt 05/03/12

## 2012-05-02 NOTE — Telephone Encounter (Signed)
S/W in re NP appt 9/26 @ 2 w/ Dr. Gaylyn Rong Referring Dr. Lynnea Ferrier Dx-Possible Myeloid Leukemia Will give patient NP packet on tomorrow.

## 2012-05-03 ENCOUNTER — Other Ambulatory Visit (HOSPITAL_BASED_OUTPATIENT_CLINIC_OR_DEPARTMENT_OTHER): Payer: Medicare Other

## 2012-05-03 ENCOUNTER — Telehealth: Payer: Self-pay | Admitting: Oncology

## 2012-05-03 ENCOUNTER — Ambulatory Visit (HOSPITAL_BASED_OUTPATIENT_CLINIC_OR_DEPARTMENT_OTHER): Payer: Medicare Other | Admitting: Oncology

## 2012-05-03 ENCOUNTER — Ambulatory Visit: Payer: Self-pay

## 2012-05-03 ENCOUNTER — Other Ambulatory Visit: Payer: Self-pay | Admitting: *Deleted

## 2012-05-03 ENCOUNTER — Encounter: Payer: Self-pay | Admitting: Oncology

## 2012-05-03 VITALS — BP 98/60 | HR 61 | Temp 97.9°F | Resp 20 | Ht 69.0 in | Wt 200.8 lb

## 2012-05-03 DIAGNOSIS — D72820 Lymphocytosis (symptomatic): Secondary | ICD-10-CM

## 2012-05-03 DIAGNOSIS — D72829 Elevated white blood cell count, unspecified: Secondary | ICD-10-CM

## 2012-05-03 DIAGNOSIS — I10 Essential (primary) hypertension: Secondary | ICD-10-CM

## 2012-05-03 LAB — COMPREHENSIVE METABOLIC PANEL (CC13)
Alkaline Phosphatase: 79 U/L (ref 40–150)
BUN: 15 mg/dL (ref 7.0–26.0)
CO2: 24 mEq/L (ref 22–29)
Creatinine: 1.2 mg/dL (ref 0.7–1.3)
Glucose: 100 mg/dl — ABNORMAL HIGH (ref 70–99)
Total Bilirubin: 0.6 mg/dL (ref 0.20–1.20)
Total Protein: 6.5 g/dL (ref 6.4–8.3)

## 2012-05-03 LAB — URIC ACID (CC13): Uric Acid, Serum: 8.8 mg/dl — ABNORMAL HIGH (ref 2.6–7.4)

## 2012-05-03 LAB — CBC WITH DIFFERENTIAL/PLATELET
BASO%: 0.3 % (ref 0.0–2.0)
EOS%: 0.5 % (ref 0.0–7.0)
HCT: 37.9 % — ABNORMAL LOW (ref 38.4–49.9)
MCH: 30.5 pg (ref 27.2–33.4)
MCHC: 31.4 g/dL — ABNORMAL LOW (ref 32.0–36.0)
MONO#: 3.8 10*3/uL — ABNORMAL HIGH (ref 0.1–0.9)
NEUT%: 90.2 % — ABNORMAL HIGH (ref 39.0–75.0)
RDW: 16.3 % — ABNORMAL HIGH (ref 11.0–14.6)
WBC: 57 10*3/uL (ref 4.0–10.3)
lymph#: 1.3 10*3/uL (ref 0.9–3.3)

## 2012-05-03 LAB — MORPHOLOGY: White Cell Comments: 14

## 2012-05-03 LAB — LACTATE DEHYDROGENASE (CC13): LDH: 344 U/L — ABNORMAL HIGH (ref 125–220)

## 2012-05-03 NOTE — Progress Notes (Signed)
[facility]  Telephone:(336) [phone] Fax:(336) [phone]     INITIAL HEMATOLOGY CONSULTATION    Referral MD:  Anthony Wolfe, M.D.  Reason for Referral: leukocytosis.     HPI:  [name] is a 66 year-old man with HLP, HTN, CAD s/p CABG, carotid artery disease with TIA.  He was in usual state of health.  He had a routine visit with his PCP on 05/01/2012.  A routine CBC was drawn which showed WBC 67.0; Hgb 13.4; Plt 328; with 90% granulocytes (containing 26% segs, 12% bands, 26% metas, 25% myelos, 1% blast).  He was thus kindly referred to the [facility] for evaluation.  [name] to the clinic for the first time today with his wife and a granddaughter.  He reports feeling well. He has been having low blood pressure lately.  He denied dizziness, SOB, chest pain, palpitation.  He denied fever, mucositis, bleeding, cough, dysuria, skin rash, bone pain, anorexia, weight loss, abdominal pain.  He is very active, and is able to perform chores around the house.   Patient denies headache, visual changes, confusion, drenching night sweats, palpable lymph node swelling, mucositis, odynophagia, dysphagia, nausea vomiting, jaundice, shortness of breath, dyspnea on exertion, productive cough, gum bleeding, epistaxis, hematemesis, hemoptysis, melena, hematochezia, hematuria, skin rash, spontaneous bleeding, joint swelling, joint pain, heat or cold intolerance, bowel bladder incontinence, back pain, focal motor weakness, paresthesia, depression, suicidal or homicidal ideation, feeling hopelessness.     Past Medical History  Diagnosis Date  . Lymphocytosis   . Carotid disease, bilateral   . Hyperlipidemia   . HTN (hypertension)   . Allergic rhinitis   . Prostate hypertrophy   . Anxiety   . CAD (coronary artery disease)   . TIA (transient ischemic attack)   :    Past Surgical History  Procedure Date  . Coronary artery bypass graft 02/2009    3  vessel  . Colonoscopy 07/2010  . Carotidectomy 2003    right side  . Back surgery   :   CURRENT MEDS: Current Outpatient Prescriptions  Medication Sig Dispense Refill  . amLODipine (NORVASC) 10 MG tablet Take 10 mg by mouth daily.       Marland Kitchen aspirin 325 MG tablet Take 325 mg by mouth daily.      . citalopram (CELEXA) 20 MG tablet Take 20 mg by mouth daily.       . furosemide (LASIX) 20 MG tablet Take 20 mg by mouth daily.       . metoprolol tartrate (LOPRESSOR) 25 MG tablet Take 25 mg by mouth 2 (two) times daily.       . simvastatin (ZOCOR) 40 MG tablet Take 40 mg by mouth at bedtime.           Allergies  Allergen Reactions  . Sulfa Antibiotics Hives  :  Family History  Problem Relation Age of Onset  . Heart disease Mother   . Alcohol abuse Father   :  History   Social History  . Marital Status: Married    Spouse Name: N/A    Number of Children: 2  . Years of Education: N/A   Occupational History  .      retired Administrator   Social History Main Topics  . Smoking status: Former Smoker    Quit date: 08/08/1997  . Smokeless tobacco: Current User  . Alcohol Use:   . Drug Use: No  . Sexually Active:    Other  Topics Concern  . Not on file   Social History Narrative  . No narrative on file  :  REVIEW OF SYSTEM:  The rest of the 14-point review of sytem was negative.   Exam: ECOG 0.   General:  well-nourished man in no acute distress.  Eyes:  no scleral icterus.  ENT:  There were no oropharyngeal lesions.  Neck was without thyromegaly.  Lymphatics:  Negative cervical, supraclavicular or axillary adenopathy.  Respiratory: lungs were clear bilaterally without wheezing or crackles.  Cardiovascular:  Regular rate and rhythm, S1/S2, without murmur, rub or gallop.  There was no pedal edema.  GI:  abdomen was soft, flat, nontender, nondistended, without organomegaly.  Muscoloskeletal:  no spinal tenderness of palpation of vertebral spine.  Skin exam was without  echymosis, petichae.  Neuro exam was nonfocal.  Patient was able to get on and off exam table without assistance.  Gait was normal.  Patient was alerted and oriented.  Attention was good.   Language was appropriate.  Mood was normal without depression.  Speech was not pressured.  Thought content was not tangential.    LABS:  Lab Results  Component Value Date   WBC 57.0* 05/03/2012   HGB 11.9* 05/03/2012   HCT 37.9* 05/03/2012   PLT 240 05/03/2012   GLUCOSE 100* 05/03/2012   ALT 11 05/03/2012   AST 19 05/03/2012   NA 143 05/03/2012   K 3.9 05/03/2012   CL 108* 05/03/2012   CREATININE 1.2 05/03/2012   BUN 15.0 05/03/2012   CO2 24 05/03/2012   INR 1.5 02/23/2009   HGBA1C  Value: 5.5 (NOTE) The ADA recommends the following therapeutic goal for glycemic control related to Hgb A1c measurement: Goal of therapy: <6.5 Hgb A1c  Reference: American Diabetes Association: Clinical Practice Recommendations 2010, Diabetes Care, 2010, 33: (Suppl  1). 02/20/2009    Blood smear review:   I personally reviewed the patient's peripheral blood smear today.  There was isocytosis.  There were left shift with increased meta and myelocytes.  There were rare blasts.  There was no schistocytosis, spherocytosis, target cell, rouleaux formation, tear drop cell.  There was no giant platelets or platelet clumps.      ASSESSMENT AND PLAN:   1.  Hypertension:  He is on Lasix, Metoprolol, and amlodipine.  I advised him to talk with his PCP to see if amlodipine or Lasix can be decreased given recent hypotension.  2.  Hyperlipidemia:  He is on simvastatin per PCP.  3.  CAD:  S/p CABG in 2010.  He is on ASA, metoprolol, simvastatin per PCP.   4.  TIA:  S/p right carotid endartectomy.  5.  Leukocytosis:  Myeloid predominant, left shift without B-symptoms, infection, bleeding.  - Differential:  Most likely chronic myeloid leukemia.  Must also rule out AML. - Work up:  Bone marrow biopsy tomorrow.  I discussed with Anthony Wolfe  potential side effects of bone marrow biopsy which include but not limited to pain, infection, bleeding, perforation.  Anthony Wolfe expressed informed understanding and wished to proceed.  - Treatment: pending work up.   *  If chronic myeloid leukemia CML:  Oral chemotherapy; very well tolerated.  *  If acute myeloid leukemia AML:  Then referral to a 3rd center such as [facility], [facility], or [facility] for treatment options including induction IV chemotherapy followed by possible bone marrow transplant.   6.  Follow up:  Next week to go over result.     Thank you  for this referral.    The length of time of the face-to-face encounter was 45 minutes. More than 50% of time was spent counseling and coordination of care.

## 2012-05-03 NOTE — Telephone Encounter (Signed)
pt had been called by dr ha to move the new pt time but did not allow time for the fin. coun,pt called with 1:00 time   aom

## 2012-05-04 ENCOUNTER — Other Ambulatory Visit (HOSPITAL_COMMUNITY)
Admission: RE | Admit: 2012-05-04 | Discharge: 2012-05-04 | Disposition: A | Discharge status: Home / Self Care | Payer: Medicare Other | Source: Ambulatory Visit | Attending: Oncology | Admitting: Oncology

## 2012-05-04 ENCOUNTER — Ambulatory Visit (HOSPITAL_BASED_OUTPATIENT_CLINIC_OR_DEPARTMENT_OTHER): Payer: Medicare Other | Admitting: Oncology

## 2012-05-04 VITALS — BP 99/57 | HR 69 | Temp 98.9°F | Resp 18

## 2012-05-04 DIAGNOSIS — D649 Anemia, unspecified: Secondary | ICD-10-CM | POA: Insufficient documentation

## 2012-05-04 DIAGNOSIS — D72829 Elevated white blood cell count, unspecified: Secondary | ICD-10-CM | POA: Insufficient documentation

## 2012-05-04 DIAGNOSIS — D72820 Lymphocytosis (symptomatic): Secondary | ICD-10-CM

## 2012-05-04 LAB — APTT: aPTT: 36 seconds (ref 24–37)

## 2012-05-04 LAB — PROTHROMBIN TIME
INR: 1.35 (ref <1.50)
Prothrombin Time: 16.5 seconds — ABNORMAL HIGH (ref 11.6–15.2)

## 2012-05-04 NOTE — Patient Instructions (Addendum)
Bone Marrow Aspiration, Bone Marrow Biopsy  Care After  Read the instructions outlined below and refer to this sheet in the next few weeks. These discharge instructions provide you with general information on caring for yourself after you leave the hospital. Your caregiver may also give you specific instructions. While your treatment has been planned according to the most current medical practices available, unavoidable complications occasionally occur. If you have any problems or questions after discharge, call your caregiver.  FINDING OUT THE RESULTS OF YOUR TEST  Not all test results are available during your visit. If your test results are not back during the visit, make an appointment with your caregiver to find out the results. Do not assume everything is normal if you have not heard from your caregiver or the medical facility. It is important for you to follow up on all of your test results.   HOME CARE INSTRUCTIONS   You have had sedation and may be sleepy or dizzy. Your thinking may not be as clear as usual. For the next 24 hours:   Only take over-the-counter or prescription medicines for pain, discomfort, and or fever as directed by your caregiver.   Do not drink alcohol.   Do not smoke.   Do not drive.   Do not make important legal decisions.   Do not operate heavy machinery.   Do not care for small children by yourself.   Keep your dressing clean and dry. You may replace dressing with a bandage after 24 hours.   You may take a bath or shower after 24 hours.   Use an ice pack for 20 minutes every 2 hours while awake for pain as needed.  SEEK MEDICAL CARE IF:    There is redness, swelling, or increasing pain at the biopsy site.   There is pus coming from the biopsy site.   There is drainage from a biopsy site lasting longer than one day.   An unexplained oral temperature above 102 F (38.9 C) develops.  SEEK IMMEDIATE MEDICAL CARE IF:    You develop a rash.   You have difficulty  breathing.   You develop any reaction or side effects to medications given.  Document Released: 02/11/2005 Document Revised: 07/14/2011 Document Reviewed: 07/22/2008  ExitCare Patient Information 2012 [employer].

## 2012-05-04 NOTE — Procedures (Signed)
   [facility]  Telephone:(336) [phone] Fax:(336) [phone]   BONE MARROW BIOPSY AND ASPIRATION   INDICATION:  Suspected CML.    Procedure: After obtained from consent, Anthony Wolfe was placed in the prone position. Time out was performed verifying correct patient and procedure. The skin overlying the left posterior crest was prepped with Betadine and draped in the usual sterile fashion. The skin and periosteum were infiltrated with 10 mL of 2% lidocaine x 3 as he was still uncomfortable after the first 20ml. A small puncture wound was made with #11 scalpel blade.  Bone marrow aspirate was obtained on the first pass of the aspiration needle.  Two separate core biopsies were obtained through the same incision as the first one was mainly subcortical.   The aspirate was sent for routine histology, flow cytometry, FISH for CML and cytogenetics.  Core biopsy was sent for routine histology.   Michaela Corner tolerated procedure well with minimal  blood loss and without immediate complication.   A sterile dressing was applied.   Jethro Bolus M.D. 05/04/2012

## 2012-05-04 NOTE — Progress Notes (Signed)
Pt tolerated bone marrow aspiration and biopsy. Pressure dressing applied left iliac crest and dry and intact at discharge. Pt understands post procedure instructions

## 2012-05-08 NOTE — Patient Instructions (Addendum)
1. Diagnosis: Chronic myelogenous leukemia (CML); chronic phase. CML was not staged like solid cancer; rather according to phases (chronic phase, accelerated phase, last phase.) Chronic phase is the best phase to be in.  2. Treatment: Oral chemotherapy. There is no role for bone marrow transplant unless the CML is refractory to treatment with oral chemotherapy. 3. Oral chemotherapy options:  They all work to block the growth mechanism of CML by blocking BCR/ABL gene.    * Imatinib (brand name: Gleevec):  Old option.   * Dasatinib (brand name:  Sprycel):  Better than imatinib in inducing remission and lower chance of transforming to blastic phase (acute myeloid leukemia AML).  Potential side effects include but not limited to low blood count, bleeding, infection, mild fatigue, skin rash, fluid retention, pleural effusion (fluid outside the lungs).  This is once daily medication.  Thus, I prefer this for ease of administration.   * Nilotinib (brand name:  Tasigna):  Also better than imatinib.   Potential side effects include but not limited to low blood count, bleeding, infection, mild fatigue, skin rash, fluid retention.  This medication is twice daily.    4. Assessment of response:  Follow up with blood test to see WBC normalizes (hematologic response); bone marrow biopsy with decreasing Philadelphia chromosome (translocation 9;22); and blood test or bone marrow FISH test with no more BCR/ABL.  5. Follow up:  In 2, 6, and 9 weeks to ensure labs OK.  Follow up visit in 1 month with Belenda Cruise, NP and with me in 3 months with bone marrow biopsy the week before.

## 2012-05-09 ENCOUNTER — Encounter: Payer: Self-pay | Admitting: Oncology

## 2012-05-09 ENCOUNTER — Telehealth: Payer: Self-pay | Admitting: Oncology

## 2012-05-09 ENCOUNTER — Ambulatory Visit (HOSPITAL_BASED_OUTPATIENT_CLINIC_OR_DEPARTMENT_OTHER): Payer: Medicare Other | Admitting: Oncology

## 2012-05-09 VITALS — BP 113/65 | HR 64 | Temp 98.2°F | Resp 20 | Ht 69.0 in | Wt 201.3 lb

## 2012-05-09 DIAGNOSIS — F17200 Nicotine dependence, unspecified, uncomplicated: Secondary | ICD-10-CM

## 2012-05-09 DIAGNOSIS — C9210 Chronic myeloid leukemia, BCR/ABL-positive, not having achieved remission: Secondary | ICD-10-CM

## 2012-05-09 HISTORY — DX: Chronic myeloid leukemia, BCR/ABL-positive, not having achieved remission: C92.10

## 2012-05-09 MED ORDER — DASATINIB 100 MG PO TABS: 100.0000 mg | ORAL_TABLET | Freq: Every day | ORAL | Status: DC

## 2012-05-09 NOTE — Progress Notes (Signed)
[employer] @ [phone] approved sprycel 100mg  from 05/09/12-08/07/13.

## 2012-05-09 NOTE — Progress Notes (Signed)
Faxed sprycel prescription to [facility] °

## 2012-05-09 NOTE — Telephone Encounter (Signed)
appts made and printed for pt aom °

## 2012-05-09 NOTE — Progress Notes (Signed)
[facility]  Telephone:(336) [phone] Fax:(336) [phone]   OFFICE PROGRESS NOTE   DIAGNOSIS:  Chronic phase CML.   CURRENT THERAPY:  Here to discuss therapy.   INTERVAL HISTORY: Anthony Wolfe 66 y.o. male returns for regular follow up with his wife and daughter to discuss result of bone marrow biopsy last week.  He reports feeling well.  He still chews tobacco.   Patient denies fever, anorexia, weight loss, fatigue, headache, visual changes, confusion, drenching night sweats, palpable lymph node swelling, mucositis, odynophagia, dysphagia, nausea vomiting, jaundice, chest pain, palpitation, shortness of breath, dyspnea on exertion, productive cough, gum bleeding, epistaxis, hematemesis, hemoptysis, abdominal pain, abdominal swelling, early satiety, melena, hematochezia, hematuria, skin rash, spontaneous bleeding, joint swelling, joint pain, heat or cold intolerance, bowel bladder incontinence, back pain, focal motor weakness, paresthesia, depression, suicidal or homicidal ideation, feeling hopelessness.   Past Medical History  Diagnosis Date  . Lymphocytosis   . Carotid disease, bilateral   . Hyperlipidemia   . HTN (hypertension)   . Allergic rhinitis   . Prostate hypertrophy   . Anxiety   . CAD (coronary artery disease)   . TIA (transient ischemic attack)   . CML (chronic myelocytic leukemia) 05/09/2012    Past Surgical History  Procedure Date  . Coronary artery bypass graft 02/2009    3 vessel  . Colonoscopy 07/2010  . Carotidectomy 2003    right side  . Back surgery     Current Outpatient Prescriptions  Medication Sig Dispense Refill  . amLODipine (NORVASC) 10 MG tablet Take 10 mg by mouth daily.       Marland Kitchen aspirin 325 MG tablet Take 325 mg by mouth daily.      . citalopram (CELEXA) 20 MG tablet Take 20 mg by mouth daily.       . furosemide (LASIX) 20 MG tablet Take 20 mg by mouth daily.       . metoprolol tartrate (LOPRESSOR) 25 MG tablet Take 25 mg by  mouth 2 (two) times daily.       . simvastatin (ZOCOR) 40 MG tablet Take 40 mg by mouth at bedtime.       . dasatinib (SPRYCEL) 100 MG tablet Take 1 tablet (100 mg total) by mouth daily.  30 tablet  3    ALLERGIES:  is allergic to sulfa antibiotics.  REVIEW OF SYSTEMS:  The rest of the 14-point review of system was negative.   Filed Vitals:   05/09/12 0931  BP: 113/65  Pulse: 64  Temp: 98.2 F (36.8 C)  Resp: 20   Wt Readings from Last 3 Encounters:  05/09/12 201 lb 4.8 oz (91.309 kg)  05/03/12 200 lb 12.8 oz (91.082 kg)   ECOG Performance status: 0  PHYSICAL EXAMINATION:   General:  well-nourished man, in no acute distress.  Eyes:  no scleral icterus.  ENT:  There were no oropharyngeal lesions.  Neck was without thyromegaly.  Lymphatics:  Negative cervical, supraclavicular or axillary adenopathy.  Respiratory: lungs were clear bilaterally without wheezing or crackles.  Cardiovascular:  Regular rate and rhythm, S1/S2, without murmur, rub or gallop.  There was no pedal edema.  GI:  abdomen was soft, flat, nontender, nondistended, without organomegaly.  Muscoloskeletal:  no spinal tenderness of palpation of vertebral spine.  Skin exam was without echymosis, petichae.  Neuro exam was nonfocal.  Patient was able to get on and off exam table without assistance.  Gait was normal.  Patient was alerted and oriented.  Attention  was good.   Language was appropriate.  Mood was normal without depression.  Speech was not pressured.  Thought content was not tangential.      LABORATORY/RADIOLOGY DATA:  Lab Results  Component Value Date   WBC 57.0* 05/03/2012   HGB 11.9* 05/03/2012   HCT 37.9* 05/03/2012   PLT 240 05/03/2012   GLUCOSE 100* 05/03/2012   ALKPHOS 79 05/03/2012   ALT 11 05/03/2012   AST 19 05/03/2012   NA 143 05/03/2012   K 3.9 05/03/2012   CL 108* 05/03/2012   CREATININE 1.2 05/03/2012   BUN 15.0 05/03/2012   CO2 24 05/03/2012   INR 1.35 05/03/2012   HGBA1C  Value: 5.5 (NOTE) The ADA  recommends the following therapeutic goal for glycemic control related to Hgb A1c measurement: Goal of therapy: <6.5 Hgb A1c  Reference: American Diabetes Association: Clinical Practice Recommendations 2010, Diabetes Care, 2010, 33: (Suppl  1). 02/20/2009     ASSESSMENT AND PLAN:  1. Hypertension: He is on Lasix, Metoprolol, and amlodipine. I advised him to talk with his PCP to see if amlodipine or Lasix can be decreased given recent hypotension.   2. Hyperlipidemia: He is on simvastatin per PCP.   3. CAD: S/p CABG in 2010. He is on ASA, metoprolol, simvastatin per PCP.   4. TIA: S/p right carotid endartectomy.  5.   Chronic myelogenous leukemia (CML); chronic phase.   - CML is not staged like solid cancer; rather according to phases (chronic phase, accelerated phase, last phase.) Chronic phase is the best phase to be in.   - Treatment: Oral chemotherapy. There is no role for bone marrow transplant unless the CML is refractory to treatment with oral chemotherapy.  -Oral chemotherapy options:  They all work to block the growth mechanism of CML by blocking BCR/ABL gene.    * Imatinib (brand name: Gleevec):  Old option.   * Dasatinib (brand name:  Sprycel):  Better than imatinib in inducing remission and lower chance of transforming to blastic phase (acute myeloid leukemia AML).  Potential side effects include but not limited to low blood count, bleeding, infection, mild fatigue, skin rash, fluid retention, pleural effusion.  This is once daily at 100mg  PO daily medication.  Thus, I prefer this for ease of administration.  Mr Gendron and his family preferred this as well.   * Nilotinib (brand name:  Tasigna):  Also better than imatinib.   Potential side effects include but not limited to low blood count, bleeding, infection, mild fatigue, skin rash, fluid retention.  This medication is twice daily.    He expressed informed understanding and signed the oral chemo consent.  His baseline EKG  showed normal QT interval.   -Assessment of response:  Follow up with blood test to see WBC normalizes (hematologic response); bone marrow biopsy with decreasing Philadelphia chromosome (translocation 9;22) (cytogenetic response); and blood test or bone marrow FISH test with no more BCR/ABL (molecular response).   -Follow up:  In 2, 6, and 9 weeks to ensure labs OK.  Follow up visit in 1 month with Belenda Cruise, NP and with me in 3 months with bone marrow biopsy the week before.

## 2012-05-14 ENCOUNTER — Encounter: Payer: Self-pay | Admitting: *Deleted

## 2012-05-14 NOTE — Progress Notes (Signed)
Fax received from [facility].  They faxed Sprycel on 05/11/12 for delivery on 05/14/12.

## 2012-05-23 ENCOUNTER — Telehealth: Payer: Self-pay | Admitting: *Deleted

## 2012-05-23 ENCOUNTER — Other Ambulatory Visit (HOSPITAL_BASED_OUTPATIENT_CLINIC_OR_DEPARTMENT_OTHER): Payer: Medicare Other | Admitting: Lab

## 2012-05-23 DIAGNOSIS — C9210 Chronic myeloid leukemia, BCR/ABL-positive, not having achieved remission: Secondary | ICD-10-CM

## 2012-05-23 LAB — COMPREHENSIVE METABOLIC PANEL (CC13)
ALT: 12 U/L (ref 0–55)
AST: 17 U/L (ref 5–34)
Albumin: 3.3 g/dL — ABNORMAL LOW (ref 3.5–5.0)
BUN: 12 mg/dL (ref 7.0–26.0)
CO2: 22 mEq/L (ref 22–29)
Calcium: 8.8 mg/dL (ref 8.4–10.4)
Chloride: 110 mEq/L — ABNORMAL HIGH (ref 98–107)
Potassium: 3.5 mEq/L (ref 3.5–5.1)

## 2012-05-23 LAB — CBC WITH DIFFERENTIAL/PLATELET
Basophils Absolute: 0.3 10*3/uL — ABNORMAL HIGH (ref 0.0–0.1)
EOS%: 0.8 % (ref 0.0–7.0)
HGB: 11.5 g/dL — ABNORMAL LOW (ref 13.0–17.1)
MCH: 31.9 pg (ref 27.2–33.4)
MONO#: 1.7 10*3/uL — ABNORMAL HIGH (ref 0.1–0.9)
NEUT#: 31.1 10*3/uL — ABNORMAL HIGH (ref 1.5–6.5)
RDW: 16.5 % — ABNORMAL HIGH (ref 11.0–14.6)
WBC: 34.6 10*3/uL — ABNORMAL HIGH (ref 4.0–10.3)
lymph#: 1.2 10*3/uL (ref 0.9–3.3)

## 2012-05-23 NOTE — Telephone Encounter (Signed)
Message copied by Wende Mott on Wed May 23, 2012  4:21 PM ------      Message from: HA, Raliegh Ip T      Created: Wed May 23, 2012  9:09 AM       Please call pt.  His CML med is working.  Continue it at current dose.  No change.  Thanks.

## 2012-05-23 NOTE — Telephone Encounter (Signed)
Called pt and informed of WBC improving, Sprycel working,  Continue at current dose. Keep next appt here in 2 weeks. Pt verbalized understanding.

## 2012-05-31 ENCOUNTER — Encounter: Payer: Self-pay | Admitting: *Deleted

## 2012-05-31 NOTE — Progress Notes (Signed)
Pharmacy Care Plan rec'd from [facility] for pt's Sprycel.  They counseled pt on correct dose and side effects.

## 2012-06-06 ENCOUNTER — Telehealth: Payer: Self-pay | Admitting: Oncology

## 2012-06-06 ENCOUNTER — Other Ambulatory Visit (HOSPITAL_BASED_OUTPATIENT_CLINIC_OR_DEPARTMENT_OTHER): Payer: Medicare Other

## 2012-06-06 ENCOUNTER — Encounter: Payer: Self-pay | Admitting: Oncology

## 2012-06-06 ENCOUNTER — Ambulatory Visit (HOSPITAL_BASED_OUTPATIENT_CLINIC_OR_DEPARTMENT_OTHER): Payer: Medicare Other | Admitting: Oncology

## 2012-06-06 VITALS — BP 114/51 | HR 72 | Temp 98.0°F | Resp 20 | Ht 69.0 in | Wt 195.9 lb

## 2012-06-06 DIAGNOSIS — C9210 Chronic myeloid leukemia, BCR/ABL-positive, not having achieved remission: Secondary | ICD-10-CM

## 2012-06-06 DIAGNOSIS — G459 Transient cerebral ischemic attack, unspecified: Secondary | ICD-10-CM

## 2012-06-06 DIAGNOSIS — I10 Essential (primary) hypertension: Secondary | ICD-10-CM

## 2012-06-06 LAB — CBC WITH DIFFERENTIAL/PLATELET
BASO%: 0.9 % (ref 0.0–2.0)
EOS%: 4.5 % (ref 0.0–7.0)
MCH: 33 pg (ref 27.2–33.4)
MCV: 97.9 fL (ref 79.3–98.0)
MONO%: 6 % (ref 0.0–14.0)
RBC: 3.04 10*6/uL — ABNORMAL LOW (ref 4.20–5.82)
RDW: 16.2 % — ABNORMAL HIGH (ref 11.0–14.6)

## 2012-06-06 LAB — COMPREHENSIVE METABOLIC PANEL (CC13)
AST: 15 U/L (ref 5–34)
Albumin: 3.5 g/dL (ref 3.5–5.0)
Alkaline Phosphatase: 85 U/L (ref 40–150)
Potassium: 3.3 mEq/L — ABNORMAL LOW (ref 3.5–5.1)
Sodium: 144 mEq/L (ref 136–145)
Total Protein: 6.5 g/dL (ref 6.4–8.3)

## 2012-06-06 NOTE — Patient Instructions (Signed)
Decrease Spycel to 50 mg (1/2 tablet) daily. Blood work weekly to monitor your blood counts. We may need to adjust dose further. We will see you for a visit in ~ 4-5 weeks.

## 2012-06-06 NOTE — Telephone Encounter (Signed)
gv pt appt schedule for November 2013 thru January 2014. Per 10/30 pof lbs wkly.

## 2012-06-06 NOTE — Progress Notes (Signed)
[facility]  Telephone:(336) [phone] Fax:(336) [phone]   OFFICE PROGRESS NOTE   DIAGNOSIS:  Chronic phase CML.   CURRENT THERAPY:  Sprycel 100 mg daily started 05/09/12  INTERVAL HISTORY: Anthony Wolfe 66 y.o. male returns for regular follow up with his wife for routine follow-up.  He reports feeling well. States his appetite has improved. He reports dyspnea with exertion only. No shortness of breath at rest. Denies chest pain, cough, edema. No abdominal pain, nausea, or vomiting. He still chews tobacco.   Patient denies fever, anorexia, weight loss, fatigue, headache, visual changes, confusion, drenching night sweats, palpable lymph node swelling, mucositis, odynophagia, dysphagia, nausea vomiting, jaundice, chest pain, palpitation, shortness of breath, dyspnea on exertion, productive cough, gum bleeding, epistaxis, hematemesis, hemoptysis, abdominal pain, abdominal swelling, early satiety, melena, hematochezia, hematuria, skin rash, spontaneous bleeding, joint swelling, joint pain, heat or cold intolerance, bowel bladder incontinence, back pain, focal motor weakness, paresthesia, depression, suicidal or homicidal ideation, feeling hopelessness.   Past Medical History  Diagnosis Date  . Lymphocytosis   . Carotid disease, bilateral   . Hyperlipidemia   . HTN (hypertension)   . Allergic rhinitis   . Prostate hypertrophy   . Anxiety   . CAD (coronary artery disease)   . TIA (transient ischemic attack)   . CML (chronic myelocytic leukemia) 05/09/2012    Past Surgical History  Procedure Date  . Coronary artery bypass graft 02/2009    3 vessel  . Colonoscopy 07/2010  . Carotidectomy 2003    right side  . Back surgery     Current Outpatient Prescriptions  Medication Sig Dispense Refill  . amLODipine (NORVASC) 10 MG tablet Take 10 mg by mouth daily.       Marland Kitchen aspirin 325 MG tablet Take 325 mg by mouth daily.      . citalopram (CELEXA) 20 MG tablet Take 20 mg by  mouth daily.       . dasatinib (SPRYCEL) 100 MG tablet Take 1 tablet (100 mg total) by mouth daily.  30 tablet  3  . furosemide (LASIX) 20 MG tablet Take 20 mg by mouth daily.       . metoprolol tartrate (LOPRESSOR) 25 MG tablet Take 25 mg by mouth 2 (two) times daily.       . simvastatin (ZOCOR) 40 MG tablet Take 40 mg by mouth at bedtime.         ALLERGIES:  is allergic to sulfa antibiotics.  REVIEW OF SYSTEMS:  The rest of the 14-point review of system was negative.   Filed Vitals:   06/06/12 0843  BP: 114/51  Pulse: 72  Temp: 98 F (36.7 C)  Resp: 20   Wt Readings from Last 3 Encounters:  06/06/12 195 lb 14.4 oz (88.86 kg)  05/09/12 201 lb 4.8 oz (91.309 kg)  05/03/12 200 lb 12.8 oz (91.082 kg)   ECOG Performance status: 0  PHYSICAL EXAMINATION:   General:  well-nourished man, in no acute distress.  Eyes:  no scleral icterus.  ENT:  There were no oropharyngeal lesions.  Neck was without thyromegaly.  Lymphatics:  Negative cervical, supraclavicular or axillary adenopathy.  Respiratory: lungs were clear bilaterally without wheezing or crackles.  Cardiovascular:  Regular rate and rhythm, S1/S2, without murmur, rub or gallop.  There was no pedal edema.  GI:  abdomen was soft, flat, nontender, nondistended, without organomegaly.  Muscoloskeletal:  no spinal tenderness of palpation of vertebral spine.  Skin exam was without echymosis, petichae.  Neuro  exam was nonfocal.  Patient was able to get on and off exam table without assistance.  Gait was normal.  Patient was alerted and oriented.  Attention was good.   Language was appropriate.  Mood was normal without depression.  Speech was not pressured.  Thought content was not tangential.      LABORATORY/RADIOLOGY DATA:  Lab Results  Component Value Date   WBC 3.8* 06/06/2012   HGB 10.0* 06/06/2012   HCT 29.8* 06/06/2012   PLT 111 Platelet count confirmed by slide estimate* 06/06/2012   GLUCOSE 124* 05/23/2012   ALKPHOS 96  05/23/2012   ALT 12 05/23/2012   AST 17 05/23/2012   NA 143 05/23/2012   K 3.5 05/23/2012   CL 110* 05/23/2012   CREATININE 1.1 05/23/2012   BUN 12.0 05/23/2012   CO2 22 05/23/2012   INR 1.35 05/03/2012   HGBA1C  Value: 5.5 (NOTE) The ADA recommends the following therapeutic goal for glycemic control related to Hgb A1c measurement: Goal of therapy: <6.5 Hgb A1c  Reference: American Diabetes Association: Clinical Practice Recommendations 2010, Diabetes Care, 2010, 33: (Suppl  1). 02/20/2009     ASSESSMENT AND PLAN:  1. Hypertension: He is on Lasix, Metoprolol, and amlodipine. BP is normotensive today.  2. Hyperlipidemia: He is on simvastatin per PCP.   3. CAD: S/p CABG in 2010. He is on ASA, metoprolol, simvastatin per PCP.   4. TIA: S/p right carotid endartectomy.  5.   Chronic myelogenous leukemia (CML); chronic phase. He is on Sprycel with grade 1 anemia, grade 1 leukopenia, and grade 1 thrombocytopenia. He has mild dyspnea due to his anemia; he has no sign of fluid retention or abnormal lung sounds. Labs reviewed with Dr Gaylyn Rong. Will reduce Sprycel dose to 50 mg daily. If counts begin to go up, we can consider increasing dose to 80 mg daily. CBC scheduled weekly until counts stabilize. Plan is for a bone marrow biopsy with decreasing Philadelphia chromosome (translocation 9;22) (cytogenetic response); and blood test or bone marrow FISH test with no more BCR/ABL (molecular response). This will need to be scheduled at his next visit.   6. Follow-up: Weekly CBC. Visit with me in 4-5 weeks and visit with Dr Gaylyn Rong in January. He will need a bone marrow biopsy the week before his visit with Dr Gaylyn Rong in January.

## 2012-06-13 ENCOUNTER — Other Ambulatory Visit (HOSPITAL_BASED_OUTPATIENT_CLINIC_OR_DEPARTMENT_OTHER): Payer: Medicare Other

## 2012-06-13 ENCOUNTER — Other Ambulatory Visit: Payer: Self-pay | Admitting: *Deleted

## 2012-06-13 DIAGNOSIS — C9210 Chronic myeloid leukemia, BCR/ABL-positive, not having achieved remission: Secondary | ICD-10-CM

## 2012-06-13 LAB — CBC WITH DIFFERENTIAL/PLATELET
Basophils Absolute: 0 10*3/uL (ref 0.0–0.1)
EOS%: 4.6 % (ref 0.0–7.0)
HGB: 9.5 g/dL — ABNORMAL LOW (ref 13.0–17.1)
MCH: 32.7 pg (ref 27.2–33.4)
MCV: 98.3 fL — ABNORMAL HIGH (ref 79.3–98.0)
MONO%: 10.8 % (ref 0.0–14.0)
RBC: 2.9 10*6/uL — ABNORMAL LOW (ref 4.20–5.82)
RDW: 16.9 % — ABNORMAL HIGH (ref 11.0–14.6)

## 2012-06-13 LAB — COMPREHENSIVE METABOLIC PANEL (CC13)
AST: 18 U/L (ref 5–34)
Albumin: 3.5 g/dL (ref 3.5–5.0)
Alkaline Phosphatase: 87 U/L (ref 40–150)
BUN: 13 mg/dL (ref 7.0–26.0)
Potassium: 3.7 mEq/L (ref 3.5–5.1)
Total Bilirubin: 0.57 mg/dL (ref 0.20–1.20)

## 2012-06-13 MED ORDER — DASATINIB 20 MG PO TABS: 20.0000 mg | ORAL_TABLET | Freq: Every day | ORAL | Status: DC

## 2012-06-13 NOTE — Telephone Encounter (Signed)
Called pt and instructed to hold Sprycel for now due to low WBC.  Informed Dr. Gaylyn Rong will reduce dose to 20 mg when he restarts.  Keep weekly lab appts and do not resume Sprycel until instructed by Dr. Gaylyn Rong or Nurse.  Informed I am sending refill for the 20 mg to [facility] so he will have it when it is time to restart.  Pt verbalized understanding.   Pt reports feeling fatigued and getting sob w/ walking upstairs.  Discussed these symptoms should improve w/ holding and then dose reducing Sprycel,  But to call us if symptoms worsen.  He verbalized understanding.

## 2012-06-13 NOTE — Telephone Encounter (Signed)
Message copied by Wende Mott on Wed Jun 13, 2012 11:57 AM ------      Message from: HA, Raliegh Ip T      Created: Wed Jun 13, 2012  9:33 AM       Please call pt.  Please ask him to HOLD off on Sprycel for now (for CML).  Despite dose reduction to 50mg  PO daily; his counts are still low.   This is not atypical of patients with CML.  We'll continue to check blood count once weekly and will let him know when to resume Sprycel again.  Thanks.

## 2012-06-20 ENCOUNTER — Other Ambulatory Visit (HOSPITAL_BASED_OUTPATIENT_CLINIC_OR_DEPARTMENT_OTHER): Payer: Medicare Other | Admitting: Lab

## 2012-06-20 DIAGNOSIS — C9210 Chronic myeloid leukemia, BCR/ABL-positive, not having achieved remission: Secondary | ICD-10-CM

## 2012-06-20 LAB — CBC WITH DIFFERENTIAL/PLATELET
Eosinophils Absolute: 0.1 10*3/uL (ref 0.0–0.5)
HCT: 30.5 % — ABNORMAL LOW (ref 38.4–49.9)
LYMPH%: 17.8 % (ref 14.0–49.0)
MONO#: 0.4 10*3/uL (ref 0.1–0.9)
NEUT#: 2.1 10*3/uL (ref 1.5–6.5)
Platelets: 145 10*3/uL (ref 140–400)
RBC: 3.08 10*6/uL — ABNORMAL LOW (ref 4.20–5.82)
WBC: 3.2 10*3/uL — ABNORMAL LOW (ref 4.0–10.3)

## 2012-06-20 NOTE — Progress Notes (Signed)
Received call from [facility] @ [facility] [phone]) stating that patient had labs drawn today and was confused about Sprycel dosage; desk nurse to verify dosage with Dr. Gaylyn Rong after he reviews labs tomorrow.

## 2012-06-21 ENCOUNTER — Telehealth: Payer: Self-pay | Admitting: *Deleted

## 2012-06-21 NOTE — Telephone Encounter (Signed)
Called pt and instructed to resume Sprycel at 50 mg daily per Dr. Gaylyn Rong.  Keep lab appt next week on 06/27/12.  Pt states has 100 mg tablets which he is breaking in half to equal 50 mg.  He verbalized good understanding of instructions.

## 2012-06-21 NOTE — Telephone Encounter (Signed)
Message copied by Wende Mott on Thu Jun 21, 2012 12:07 PM ------      Message from: HA, Raliegh Ip T      Created: Thu Jun 21, 2012 11:06 AM       Please call pt and ask him to resume Sprycel now at 50mg  PO daily.  We ordered this about 1 week ago.  It should be available for him.  Continue to check weekly CBC for now.  Thanks.

## 2012-06-27 ENCOUNTER — Other Ambulatory Visit (HOSPITAL_BASED_OUTPATIENT_CLINIC_OR_DEPARTMENT_OTHER): Payer: Medicare Other

## 2012-06-27 DIAGNOSIS — C9210 Chronic myeloid leukemia, BCR/ABL-positive, not having achieved remission: Secondary | ICD-10-CM

## 2012-06-27 LAB — CBC WITH DIFFERENTIAL/PLATELET
Basophils Absolute: 0.1 10*3/uL (ref 0.0–0.1)
Eosinophils Absolute: 0 10*3/uL (ref 0.0–0.5)
HCT: 30.9 % — ABNORMAL LOW (ref 38.4–49.9)
LYMPH%: 14.7 % (ref 14.0–49.0)
MCHC: 33.9 g/dL (ref 32.0–36.0)
MONO#: 0.5 10*3/uL (ref 0.1–0.9)
NEUT%: 70.7 % (ref 39.0–75.0)
Platelets: 124 10*3/uL — ABNORMAL LOW (ref 140–400)
WBC: 3.7 10*3/uL — ABNORMAL LOW (ref 4.0–10.3)

## 2012-06-29 ENCOUNTER — Other Ambulatory Visit: Payer: Self-pay

## 2012-06-29 MED ORDER — DASATINIB 50 MG PO TABS: 50.0000 mg | ORAL_TABLET | Freq: Every day | ORAL | Status: DC

## 2012-06-29 NOTE — Telephone Encounter (Signed)
Per Clenton Pare, NP,   Call in refill on Sprycel 50 mg daily to [facility].  Called pt and notified him of refill called in and to continue the 50 mg daily unless directed otherwise and keep lab appt next week as scheduled.

## 2012-07-04 ENCOUNTER — Other Ambulatory Visit (HOSPITAL_BASED_OUTPATIENT_CLINIC_OR_DEPARTMENT_OTHER): Payer: Medicare Other

## 2012-07-04 ENCOUNTER — Telehealth: Payer: Self-pay | Admitting: *Deleted

## 2012-07-04 DIAGNOSIS — C9210 Chronic myeloid leukemia, BCR/ABL-positive, not having achieved remission: Secondary | ICD-10-CM

## 2012-07-04 LAB — CBC WITH DIFFERENTIAL/PLATELET
BASO%: 0.1 % (ref 0.0–2.0)
EOS%: 0 % (ref 0.0–7.0)
HCT: 33.1 % — ABNORMAL LOW (ref 38.4–49.9)
LYMPH%: 15.5 % (ref 14.0–49.0)
MCH: 33.5 pg — ABNORMAL HIGH (ref 27.2–33.4)
MCHC: 33.4 g/dL (ref 32.0–36.0)
MCV: 100.3 fL — ABNORMAL HIGH (ref 79.3–98.0)
NEUT%: 74.7 % (ref 39.0–75.0)
Platelets: 205 10*3/uL (ref 140–400)

## 2012-07-04 NOTE — Telephone Encounter (Signed)
Called pt again to confirm that [facility] was able to contact him to arrange delivery of medication.  Wife answered phone and states pt did speak w/ the pharmacy.

## 2012-07-04 NOTE — Telephone Encounter (Signed)
Called pt w/ results of CBC and instructions to continue Sprycel at 50 mg.  Pt verbalized understanding but reports he has not received Sprycel refill from [facility] yet.  Informed him it was called in last Friday 06/29/12 and I will call [facility] now.   Called [facility] and spoke w/ Judeth Cornfield,  She states they have had difficulty in contacting pt for delivery.  Reviewed home and cell number w/ her and she will call on his cell now since this nurse just spoke w/ him on cell.

## 2012-07-04 NOTE — Telephone Encounter (Signed)
Message copied by Wende Mott on Wed Jul 04, 2012 10:51 AM ------      Message from: Clenton Pare R      Created: Wed Jul 04, 2012  9:37 AM       Please call pt. Labs remain stable. Continue Sprycel 50 mg daily. Keep lab and visit appts as already scheduled.

## 2012-07-11 ENCOUNTER — Ambulatory Visit (HOSPITAL_BASED_OUTPATIENT_CLINIC_OR_DEPARTMENT_OTHER): Payer: Medicare Other | Admitting: Oncology

## 2012-07-11 ENCOUNTER — Encounter: Payer: Self-pay | Admitting: Oncology

## 2012-07-11 ENCOUNTER — Other Ambulatory Visit (HOSPITAL_BASED_OUTPATIENT_CLINIC_OR_DEPARTMENT_OTHER): Payer: Medicare Other | Admitting: Lab

## 2012-07-11 ENCOUNTER — Other Ambulatory Visit: Payer: Self-pay

## 2012-07-11 VITALS — BP 100/56 | HR 61 | Temp 97.4°F | Resp 20 | Ht 69.0 in | Wt 193.4 lb

## 2012-07-11 DIAGNOSIS — C9210 Chronic myeloid leukemia, BCR/ABL-positive, not having achieved remission: Secondary | ICD-10-CM

## 2012-07-11 DIAGNOSIS — G459 Transient cerebral ischemic attack, unspecified: Secondary | ICD-10-CM

## 2012-07-11 DIAGNOSIS — E785 Hyperlipidemia, unspecified: Secondary | ICD-10-CM

## 2012-07-11 DIAGNOSIS — I10 Essential (primary) hypertension: Secondary | ICD-10-CM

## 2012-07-11 LAB — CBC WITH DIFFERENTIAL/PLATELET
BASO%: 0.8 % (ref 0.0–2.0)
EOS%: 1.4 % (ref 0.0–7.0)
MCH: 34.1 pg — ABNORMAL HIGH (ref 27.2–33.4)
MCHC: 33.9 g/dL (ref 32.0–36.0)
MCV: 100.7 fL — ABNORMAL HIGH (ref 79.3–98.0)
MONO%: 15.9 % — ABNORMAL HIGH (ref 0.0–14.0)
NEUT%: 67.1 % (ref 39.0–75.0)
RDW: 17.4 % — ABNORMAL HIGH (ref 11.0–14.6)
lymph#: 0.9 10*3/uL (ref 0.9–3.3)

## 2012-07-11 LAB — COMPREHENSIVE METABOLIC PANEL (CC13)
ALT: 18 U/L (ref 0–55)
AST: 14 U/L (ref 5–34)
Alkaline Phosphatase: 90 U/L (ref 40–150)
Calcium: 8 mg/dL — ABNORMAL LOW (ref 8.4–10.4)
Chloride: 106 mEq/L (ref 98–107)
Creatinine: 1 mg/dL (ref 0.7–1.3)
Potassium: 3.9 mEq/L (ref 3.5–5.1)

## 2012-07-11 NOTE — Progress Notes (Signed)
[facility]  Telephone:(336) [phone] Fax:(336) [phone]   OFFICE PROGRESS NOTE   DIAGNOSIS:  Chronic phase CML.   CURRENT THERAPY:  Sprycel 100 mg daily started 05/09/12. Dose was reduced to 50 mg daily on 06/06/12 due to pancytopenia.  INTERVAL HISTORY: Anthony Wolfe 66 y.o. male returns for regular follow up with his wife.  Since we last saw him, he was treated for pneumonia by his PCP with Avelox, Prednisone, and an albuterol inhaler. He reports feeling better. States his appetite has improved. He reports dyspnea with exertion only. No shortness of breath at rest. Denies chest pain, cough, edema. No abdominal pain, nausea, or vomiting. He still chews tobacco. He reports that he thinks his heart feels like it is racing, particularly at night when he is laying down.  Patient denies fever, anorexia, weight loss, fatigue, headache, visual changes, confusion, drenching night sweats, palpable lymph node swelling, mucositis, odynophagia, dysphagia, nausea vomiting, jaundice, chest pain, palpitation, shortness of breath, dyspnea on exertion, productive cough, gum bleeding, epistaxis, hematemesis, hemoptysis, abdominal pain, abdominal swelling, early satiety, melena, hematochezia, hematuria, skin rash, spontaneous bleeding, joint swelling, joint pain, heat or cold intolerance, bowel bladder incontinence, back pain, focal motor weakness, paresthesia, depression, suicidal or homicidal ideation, feeling hopelessness.   Past Medical History  Diagnosis Date  . Lymphocytosis   . Carotid disease, bilateral   . Hyperlipidemia   . HTN (hypertension)   . Allergic rhinitis   . Prostate hypertrophy   . Anxiety   . CAD (coronary artery disease)   . TIA (transient ischemic attack)   . CML (chronic myelocytic leukemia) 05/09/2012    Past Surgical History  Procedure Date  . Coronary artery bypass graft 02/2009    3 vessel  . Colonoscopy 07/2010  . Carotidectomy 2003    right side  .  Back surgery     Current Outpatient Prescriptions  Medication Sig Dispense Refill  . aspirin 325 MG tablet Take 325 mg by mouth daily.      . citalopram (CELEXA) 20 MG tablet Take 20 mg by mouth daily.       . dasatinib (SPRYCEL) 50 MG tablet Take 1 tablet (50 mg total) by mouth daily.  30 tablet  0  . furosemide (LASIX) 20 MG tablet Take 20 mg by mouth daily.       . metoprolol tartrate (LOPRESSOR) 25 MG tablet Take 25 mg by mouth 2 (two) times daily.       . simvastatin (ZOCOR) 40 MG tablet Take 40 mg by mouth at bedtime.         ALLERGIES:  is allergic to sulfa antibiotics.  REVIEW OF SYSTEMS:  The rest of the 14-point review of system was negative.   Filed Vitals:   07/11/12 0922  BP: 100/56  Pulse: 61  Temp: 97.4 F (36.3 C)  Resp: 20   Wt Readings from Last 3 Encounters:  07/11/12 193 lb 6.4 oz (87.726 kg)  06/06/12 195 lb 14.4 oz (88.86 kg)  05/09/12 201 lb 4.8 oz (91.309 kg)   ECOG Performance status: 0  PHYSICAL EXAMINATION:   General:  well-nourished man, in no acute distress.  Eyes:  no scleral icterus.  ENT:  There were no oropharyngeal lesions.  Neck was without thyromegaly.  Lymphatics:  Negative cervical, supraclavicular or axillary adenopathy.  Respiratory: lungs were clear bilaterally without wheezing or crackles.  Cardiovascular:  Regular rate and rhythm, S1/S2, without murmur, rub or gallop.  There was no pedal edema.  GI:  abdomen was soft, flat, nontender, nondistended, without organomegaly.  Muscoloskeletal:  no spinal tenderness of palpation of vertebral spine.  Skin exam was without echymosis, petichae.  Neuro exam was nonfocal.  Patient was able to get on and off exam table without assistance.  Gait was normal.  Patient was alerted and oriented.  Attention was good.   Language was appropriate.  Mood was normal without depression.  Speech was not pressured.  Thought content was not tangential.      LABORATORY/RADIOLOGY DATA:  Lab Results  Component  Value Date   WBC 6.4 07/11/2012   HGB 11.3* 07/11/2012   HCT 33.5* 07/11/2012   PLT 140 07/11/2012   GLUCOSE 90 07/11/2012   ALKPHOS 90 07/11/2012   ALT 18 07/11/2012   AST 14 07/11/2012   NA 142 07/11/2012   K 3.9 07/11/2012   CL 106 07/11/2012   CREATININE 1.0 07/11/2012   BUN 11.0 07/11/2012   CO2 26 07/11/2012   INR 1.35 05/03/2012   HGBA1C  Value: 5.5 (NOTE) The ADA recommends the following therapeutic goal for glycemic control related to Hgb A1c measurement: Goal of therapy: <6.5 Hgb A1c  Reference: American Diabetes Association: Clinical Practice Recommendations 2010, Diabetes Care, 2010, 33: (Suppl  1). 02/20/2009     ASSESSMENT AND PLAN:  1. Hypertension: He is on Lasix and Metoprolol. Amlodipine was stopped recently by PCP. BP is normotensive today.  2. Hyperlipidemia: He is on simvastatin per PCP.   3. CAD: S/p CABG in 2010. He is on ASA, metoprolol, simvastatin per PCP. EKG obtained today shows Sinus bradycardia, otherwise normal EKG.  4. TIA: S/p right carotid endartectomy.  5.   Chronic myelogenous leukemia (CML); chronic phase. Pancytopenia has improved with dose reduction of Sprycel from 100 mg to 50 mg daily. He has mild dyspnea due to his anemia; he has no sign of fluid retention or abnormal lung sounds. Recommend that he continue Sprycel 50 mg daily. CBC scheduled weekly. Plan is for a bone marrow biopsy with decreasing Philadelphia chromosome (translocation 9;22) (cytogenetic response); and blood test or bone marrow FISH test with no more BCR/ABL (molecular response). Will discuss with Dr Gaylyn Rong schedule for bone marrow biopsy.   6. Follow-up: Weekly CBC. He has a visit with Dr Gaylyn Rong in January. He will need a bone marrow biopsy the week before his visit with Dr Gaylyn Rong in January.

## 2012-07-18 ENCOUNTER — Other Ambulatory Visit (HOSPITAL_BASED_OUTPATIENT_CLINIC_OR_DEPARTMENT_OTHER): Payer: Medicare Other | Admitting: Lab

## 2012-07-18 DIAGNOSIS — C9210 Chronic myeloid leukemia, BCR/ABL-positive, not having achieved remission: Secondary | ICD-10-CM

## 2012-07-18 LAB — CBC WITH DIFFERENTIAL/PLATELET
BASO%: 1.1 % (ref 0.0–2.0)
Basophils Absolute: 0.1 10*3/uL (ref 0.0–0.1)
EOS%: 1.5 % (ref 0.0–7.0)
HCT: 32.1 % — ABNORMAL LOW (ref 38.4–49.9)
HGB: 10.5 g/dL — ABNORMAL LOW (ref 13.0–17.1)
LYMPH%: 16.2 % (ref 14.0–49.0)
MCH: 32.6 pg (ref 27.2–33.4)
MCHC: 32.8 g/dL (ref 32.0–36.0)
MCV: 99.4 fL — ABNORMAL HIGH (ref 79.3–98.0)
MONO%: 9.3 % (ref 0.0–14.0)
NEUT%: 71.9 % (ref 39.0–75.0)
lymph#: 0.8 10*3/uL — ABNORMAL LOW (ref 0.9–3.3)

## 2012-07-19 ENCOUNTER — Telehealth: Payer: Self-pay | Admitting: *Deleted

## 2012-07-19 NOTE — Telephone Encounter (Signed)
Message copied by Wende Mott on Thu Jul 19, 2012  9:03 AM ------      Message from: Clenton Pare R      Created: Wed Jul 18, 2012  9:31 PM       Call patient. Labs are stable. Continue Sprycel and current dose.

## 2012-07-19 NOTE — Telephone Encounter (Signed)
Called pt w/ lab results stable,  Continue current dose of Srpycel at 50 mg daily and keep weekly lab appts as scheduled.  Pt verbalized understanding.

## 2012-07-20 ENCOUNTER — Other Ambulatory Visit: Payer: Self-pay | Admitting: *Deleted

## 2012-07-20 NOTE — Telephone Encounter (Signed)
THIS REFILL REQUEST FOR SPRYCEL WAS GIVEN TO DR.HA'S NURSE, CAMEO PORTER,RN.

## 2012-07-23 ENCOUNTER — Other Ambulatory Visit: Payer: Self-pay | Admitting: Oncology

## 2012-07-25 ENCOUNTER — Other Ambulatory Visit (HOSPITAL_BASED_OUTPATIENT_CLINIC_OR_DEPARTMENT_OTHER): Payer: Medicare Other | Admitting: Lab

## 2012-07-25 ENCOUNTER — Telehealth: Payer: Self-pay

## 2012-07-25 DIAGNOSIS — C9210 Chronic myeloid leukemia, BCR/ABL-positive, not having achieved remission: Secondary | ICD-10-CM

## 2012-07-25 LAB — CBC WITH DIFFERENTIAL/PLATELET
BASO%: 1 % (ref 0.0–2.0)
Basophils Absolute: 0.1 10*3/uL (ref 0.0–0.1)
EOS%: 2.2 % (ref 0.0–7.0)
HGB: 10.9 g/dL — ABNORMAL LOW (ref 13.0–17.1)
MCH: 32.3 pg (ref 27.2–33.4)
MCHC: 32.6 g/dL (ref 32.0–36.0)
MCV: 99 fL — ABNORMAL HIGH (ref 79.3–98.0)
MONO%: 10.2 % (ref 0.0–14.0)
RBC: 3.38 10*6/uL — ABNORMAL LOW (ref 4.20–5.82)
RDW: 16.6 % — ABNORMAL HIGH (ref 11.0–14.6)
lymph#: 0.9 10*3/uL (ref 0.9–3.3)

## 2012-07-25 NOTE — Telephone Encounter (Signed)
Message copied by Kallie Locks on Wed Jul 25, 2012  4:00 PM ------      Message from: Clenton Pare R      Created: Wed Jul 25, 2012 12:58 PM       Please call pt. Lab is stable. Continue current dose of Sprycel.

## 2012-07-31 ENCOUNTER — Other Ambulatory Visit (HOSPITAL_BASED_OUTPATIENT_CLINIC_OR_DEPARTMENT_OTHER): Payer: Medicare Other | Admitting: Lab

## 2012-07-31 DIAGNOSIS — C9210 Chronic myeloid leukemia, BCR/ABL-positive, not having achieved remission: Secondary | ICD-10-CM

## 2012-07-31 LAB — CBC WITH DIFFERENTIAL/PLATELET
Basophils Absolute: 0.1 10*3/uL (ref 0.0–0.1)
EOS%: 2.1 % (ref 0.0–7.0)
Eosinophils Absolute: 0.1 10*3/uL (ref 0.0–0.5)
HGB: 11.9 g/dL — ABNORMAL LOW (ref 13.0–17.1)
MCV: 99 fL — ABNORMAL HIGH (ref 79.3–98.0)
MONO%: 12.6 % (ref 0.0–14.0)
NEUT#: 3.6 10*3/uL (ref 1.5–6.5)
RBC: 3.62 10*6/uL — ABNORMAL LOW (ref 4.20–5.82)
RDW: 15.9 % — ABNORMAL HIGH (ref 11.0–14.6)
lymph#: 1.3 10*3/uL (ref 0.9–3.3)

## 2012-08-02 ENCOUNTER — Telehealth: Payer: Self-pay

## 2012-08-02 NOTE — Telephone Encounter (Signed)
Message copied by Kallie Locks on Thu Aug 02, 2012 12:10 PM ------      Message from: Myrtis Ser      Created: Tue Jul 31, 2012 12:41 PM       Call pt. CBC is stable. Continue Sprycel at current dose. Keep appt next week for BM bx.

## 2012-08-07 ENCOUNTER — Other Ambulatory Visit (HOSPITAL_BASED_OUTPATIENT_CLINIC_OR_DEPARTMENT_OTHER): Payer: Medicare Other | Admitting: Lab

## 2012-08-07 ENCOUNTER — Ambulatory Visit (HOSPITAL_BASED_OUTPATIENT_CLINIC_OR_DEPARTMENT_OTHER): Payer: Medicare Other | Admitting: Oncology

## 2012-08-07 ENCOUNTER — Other Ambulatory Visit (HOSPITAL_COMMUNITY)
Admission: RE | Admit: 2012-08-07 | Discharge: 2012-08-07 | Disposition: A | Discharge status: Home / Self Care | Payer: Medicare Other | Source: Ambulatory Visit | Attending: Oncology | Admitting: Oncology

## 2012-08-07 VITALS — BP 133/79 | HR 91 | Temp 98.1°F

## 2012-08-07 DIAGNOSIS — C9210 Chronic myeloid leukemia, BCR/ABL-positive, not having achieved remission: Secondary | ICD-10-CM | POA: Insufficient documentation

## 2012-08-07 DIAGNOSIS — D649 Anemia, unspecified: Secondary | ICD-10-CM

## 2012-08-07 LAB — COMPREHENSIVE METABOLIC PANEL (CC13)
AST: 11 U/L (ref 5–34)
Albumin: 3.4 g/dL — ABNORMAL LOW (ref 3.5–5.0)
Alkaline Phosphatase: 84 U/L (ref 40–150)
BUN: 14 mg/dL (ref 7.0–26.0)
Calcium: 8.8 mg/dL (ref 8.4–10.4)
Chloride: 108 mEq/L — ABNORMAL HIGH (ref 98–107)
Potassium: 3.7 mEq/L (ref 3.5–5.1)
Sodium: 143 mEq/L (ref 136–145)
Total Protein: 6.3 g/dL — ABNORMAL LOW (ref 6.4–8.3)

## 2012-08-07 LAB — CBC WITH DIFFERENTIAL/PLATELET
Basophils Absolute: 0.1 10*3/uL (ref 0.0–0.1)
EOS%: 1.9 % (ref 0.0–7.0)
Eosinophils Absolute: 0.1 10*3/uL (ref 0.0–0.5)
HGB: 11.9 g/dL — ABNORMAL LOW (ref 13.0–17.1)
MCV: 98.1 fL — ABNORMAL HIGH (ref 79.3–98.0)
MONO%: 10.7 % (ref 0.0–14.0)
NEUT#: 4.1 10*3/uL (ref 1.5–6.5)
RBC: 3.58 10*6/uL — ABNORMAL LOW (ref 4.20–5.82)
RDW: 16.1 % — ABNORMAL HIGH (ref 11.0–14.6)
lymph#: 1 10*3/uL (ref 0.9–3.3)

## 2012-08-07 LAB — CBC
Hemoglobin: 11.7 g/dL — ABNORMAL LOW (ref 13.0–17.0)
MCH: 31.1 pg (ref 26.0–34.0)
MCHC: 31.8 g/dL (ref 30.0–36.0)
Platelets: 197 10*3/uL (ref 150–400)
RDW: 14.7 % (ref 11.5–15.5)

## 2012-08-07 LAB — DIFFERENTIAL
Basophils Absolute: 0 10*3/uL (ref 0.0–0.1)
Basophils Relative: 1 % (ref 0–1)
Eosinophils Absolute: 0.1 10*3/uL (ref 0.0–0.7)
Neutro Abs: 3.9 10*3/uL (ref 1.7–7.7)
Neutrophils Relative %: 70 % (ref 43–77)

## 2012-08-07 NOTE — Procedures (Signed)
   [facility]  Telephone:(336) [phone] Fax:(336) [phone]   BONE MARROW BIOPSY AND ASPIRATION   INDICATION:  Restaging CML.   Procedure: After obtained from consent, Anthony Wolfe was placed in the prone position. Time out was performed verifying correct patient and procedure. The skin overlying the left posterior crest was prepped with Betadine and draped in the usual sterile fashion. The skin and periosteum were infiltrated with 20 mL of 2% lidocaine. A small puncture wound was made with #11 scalpel blade.  Bone marrow aspirate was obtained on the first pass of the aspiration needle.  One core biopsy was obtained through the same incision.   The aspirate was sent for routine histology, flow cytometry, BCR/ABL FISH, and cytogenetics.  Core biopsy was sent for routine histology.   Anthony Wolfe tolerated procedure well with minimal  blood loss and without immediate complication.   A sterile dressing was applied.   Jethro Bolus M.D. 08/07/2012

## 2012-08-07 NOTE — Progress Notes (Signed)
See bone marrow biopsy procedure note dated 08/07/12.

## 2012-08-07 NOTE — Progress Notes (Signed)
Pt in for bone marrow biopsy.  Pt tolerated procedure well.  Pt and wife instructed to monitor site for the next 24 hours and leave bandage on for the next 24 hours.  Pt and wife instructed to call for excessive bleeding, bleeding that cannot be stopped with pressure, or increased pain not relieved by tylenol.  Pt and wife encouraged to call with other questions or concerns as well.  PT and wife verbalized understanding.  Pt's dressing had small size of bleeding on bandage prior to discharge but bleeding was controlled, Dr. Gaylyn Rong aware.

## 2012-08-07 NOTE — Patient Instructions (Addendum)
Bone Marrow Aspiration, Bone Marrow Biopsy  Care After  Read the instructions outlined below and refer to this sheet in the next few weeks. These discharge instructions provide you with general information on caring for yourself after you leave the hospital. Your caregiver may also give you specific instructions. While your treatment has been planned according to the most current medical practices available, unavoidable complications occasionally occur. If you have any problems or questions after discharge, call your caregiver.  FINDING OUT THE RESULTS OF YOUR TEST  Not all test results are available during your visit. If your test results are not back during the visit, make an appointment with your caregiver to find out the results. Do not assume everything is normal if you have not heard from your caregiver or the medical facility. It is important for you to follow up on all of your test results.   HOME CARE INSTRUCTIONS   You have had sedation and may be sleepy or dizzy. Your thinking may not be as clear as usual. For the next 24 hours:   Only take over-the-counter or prescription medicines for pain, discomfort, and or fever as directed by your caregiver.   Do not drink alcohol.   Do not smoke.   Do not drive.   Do not make important legal decisions.   Do not operate heavy machinery.   Do not care for small children by yourself.   Keep your dressing clean and dry. You may replace dressing with a bandage after 24 hours.   You may take a bath or shower after 24 hours.   Use an ice pack for 20 minutes every 2 hours while awake for pain as needed.  SEEK MEDICAL CARE IF:    There is redness, swelling, or increasing pain at the biopsy site.   There is pus coming from the biopsy site.   There is drainage from a biopsy site lasting longer than one day.   An unexplained oral temperature above 102 F (38.9 C) develops.  SEEK IMMEDIATE MEDICAL CARE IF:    You develop a rash.   You have difficulty  breathing.   You develop any reaction or side effects to medications given.  Document Released: 02/11/2005 Document Revised: 10/17/2011 Document Reviewed: 07/22/2008  ExitCare Patient Information 2013 [employer].

## 2012-08-14 NOTE — Patient Instructions (Addendum)
1.  Diagnosis:  Chronic phase CML. 2.  Treatment:  Sprycel 50mg  by mouth once daily. 3.  Status:  Hematologic response.  4.  Recommendation:  Continue current dose of Sprycel.  Visit every 3 months for now.  Repeat lab monthly.

## 2012-08-15 ENCOUNTER — Other Ambulatory Visit (HOSPITAL_BASED_OUTPATIENT_CLINIC_OR_DEPARTMENT_OTHER): Payer: Medicare Other | Admitting: Lab

## 2012-08-15 ENCOUNTER — Other Ambulatory Visit: Payer: Self-pay

## 2012-08-15 ENCOUNTER — Telehealth: Payer: Self-pay | Admitting: Oncology

## 2012-08-15 ENCOUNTER — Ambulatory Visit (HOSPITAL_BASED_OUTPATIENT_CLINIC_OR_DEPARTMENT_OTHER): Payer: Medicare Other | Admitting: Oncology

## 2012-08-15 VITALS — BP 132/71 | HR 53 | Temp 97.4°F | Resp 20 | Ht 69.0 in | Wt 196.2 lb

## 2012-08-15 DIAGNOSIS — C9210 Chronic myeloid leukemia, BCR/ABL-positive, not having achieved remission: Secondary | ICD-10-CM

## 2012-08-15 LAB — COMPREHENSIVE METABOLIC PANEL (CC13)
Albumin: 3.5 g/dL (ref 3.5–5.0)
Alkaline Phosphatase: 78 U/L (ref 40–150)
Calcium: 9.3 mg/dL (ref 8.4–10.4)
Chloride: 107 mEq/L (ref 98–107)
Glucose: 92 mg/dl (ref 70–99)
Potassium: 3.5 mEq/L (ref 3.5–5.1)
Sodium: 144 mEq/L (ref 136–145)
Total Protein: 6.8 g/dL (ref 6.4–8.3)

## 2012-08-15 LAB — CBC WITH DIFFERENTIAL/PLATELET
Basophils Absolute: 0.1 10*3/uL (ref 0.0–0.1)
Eosinophils Absolute: 0.1 10*3/uL (ref 0.0–0.5)
HGB: 12.1 g/dL — ABNORMAL LOW (ref 13.0–17.1)
MCV: 97.9 fL (ref 79.3–98.0)
MONO#: 0.6 10*3/uL (ref 0.1–0.9)
MONO%: 9 % (ref 0.0–14.0)
NEUT#: 4.9 10*3/uL (ref 1.5–6.5)
RBC: 3.75 10*6/uL — ABNORMAL LOW (ref 4.20–5.82)
RDW: 15.5 % — ABNORMAL HIGH (ref 11.0–14.6)
WBC: 6.5 10*3/uL (ref 4.0–10.3)
lymph#: 0.8 10*3/uL — ABNORMAL LOW (ref 0.9–3.3)

## 2012-08-15 NOTE — Telephone Encounter (Signed)
Gave pt appt for lab, ML and MD for February ,March, April, and July 2014

## 2012-08-15 NOTE — Progress Notes (Signed)
[facility]  Telephone:(336) [phone] Fax:(336) [phone]   OFFICE PROGRESS NOTE   Cc:  PICKARD,WARREN TOM, MD  DIAGNOSIS: Chronic phase CML; presented with peripheral blood bcr/abl (b2a2/b3a2 subtype) of 289.75% on 05/03/2012.     CURRENT THERAPY: Sprycel 100 mg daily started 05/09/12. Dose was reduced to 50 mg daily on 06/06/12 due to pancytopenia.   INTERVAL HISTORY: Anthony Wolfe 67 y.o. male returns for regular follow up with his wife.  With dose reduction of Sprycel from 100mg  to 50mg  PO daily, his fatigue, anorexia completely resolved.   Patient now denies fever, anorexia, weight loss, fatigue, headache, visual changes, confusion, drenching night sweats, palpable lymph node swelling, mucositis, odynophagia, dysphagia, nausea vomiting, jaundice, chest pain, palpitation, shortness of breath, dyspnea on exertion, productive cough, gum bleeding, epistaxis, hematemesis, hemoptysis, abdominal pain, abdominal swelling, early satiety, melena, hematochezia, hematuria, skin rash, spontaneous bleeding, joint swelling, joint pain, heat or cold intolerance, bowel bladder incontinence, back pain, focal motor weakness, paresthesia, depression, suicidal or homicidal ideation, feeling hopelessness.   Past Medical History  Diagnosis Date  . Lymphocytosis   . Carotid disease, bilateral   . Hyperlipidemia   . HTN (hypertension)   . Allergic rhinitis   . Prostate hypertrophy   . Anxiety   . CAD (coronary artery disease)   . TIA (transient ischemic attack)   . CML (chronic myelocytic leukemia) 05/09/2012    Past Surgical History  Procedure Date  . Coronary artery bypass graft 02/2009    3 vessel  . Colonoscopy 07/2010  . Carotidectomy 2003    right side  . Back surgery     Current Outpatient Prescriptions  Medication Sig Dispense Refill  . aspirin 325 MG tablet Take 325 mg by mouth daily.      . citalopram (CELEXA) 20 MG tablet Take 20 mg by mouth daily.       .  dasatinib (SPRYCEL) 50 MG tablet Take 1 tablet (50 mg total) by mouth daily.  30 tablet  0  . furosemide (LASIX) 20 MG tablet Take 20 mg by mouth daily.       . metoprolol tartrate (LOPRESSOR) 25 MG tablet Take 25 mg by mouth 2 (two) times daily.       . simvastatin (ZOCOR) 40 MG tablet Take 40 mg by mouth at bedtime.         ALLERGIES:  is allergic to sulfa antibiotics.  REVIEW OF SYSTEMS:  The rest of the 14-point review of system was negative.   Filed Vitals:   08/15/12 1003  BP: 132/71  Pulse: 53  Temp: 97.4 F (36.3 C)  Resp: 20   Wt Readings from Last 3 Encounters:  08/15/12 196 lb 3.2 oz (88.996 kg)  07/11/12 193 lb 6.4 oz (87.726 kg)  06/06/12 195 lb 14.4 oz (88.86 kg)   ECOG Performance status: 0  PHYSICAL EXAMINATION:   General:  well-nourished man in no acute distress.  Eyes:  no scleral icterus.  ENT:  There were no oropharyngeal lesions.  Neck was without thyromegaly.  Lymphatics:  Negative cervical, supraclavicular or axillary adenopathy.  Respiratory: lungs were clear bilaterally without wheezing or crackles.  Cardiovascular:  Regular rate and rhythm, S1/S2, without murmur, rub or gallop.  There was no pedal edema.  GI:  abdomen was soft, flat, nontender, nondistended, without organomegaly.  Muscoloskeletal:  no spinal tenderness of palpation of vertebral spine.  Skin exam was without echymosis, petichae.  Neuro exam was nonfocal.  Patient was able  to get on and off exam table without assistance.  Gait was normal.  Patient was alerted and oriented.  Attention was good.   Language was appropriate.  Mood was normal without depression.  Speech was not pressured.  Thought content was not tangential.    LABORATORY/RADIOLOGY DATA:  Lab Results  Component Value Date   WBC 6.5 08/15/2012   HGB 12.1* 08/15/2012   HCT 36.8* 08/15/2012   PLT 150 08/15/2012   GLUCOSE 92 08/15/2012   ALKPHOS 78 08/15/2012   ALT 13 08/15/2012   AST 13 08/15/2012   NA 144 08/15/2012   K 3.5 08/15/2012   CL 107  08/15/2012   CREATININE 0.9 08/15/2012   BUN 12.0 08/15/2012   CO2 27 08/15/2012   INR 1.35 05/03/2012   HGBA1C  Value: 5.5 (NOTE) The ADA recommends the following therapeutic goal for glycemic control related to Hgb A1c measurement: Goal of therapy: <6.5 Hgb A1c  Reference: American Diabetes Association: Clinical Practice Recommendations 2010, Diabetes Care, 2010, 33: (Suppl  1). 02/20/2009    Bone marrow biopsy on 08/07/12 showed no morphological evidence of CML.   FISH from bone marrow aspirate showed 7.5% BCR/ABL.   EKG:  Today showed normal QT interval.     ASSESSMENT AND PLAN:   1. Hypertension: He is on Lasix, Metoprolol.  2. Hyperlipidemia: He is on simvastatin per PCP.   3. CAD: S/p CABG in 2010. He is on ASA, metoprolol, simvastatin per PCP.   4. TIA: S/p right carotid endartectomy.   5. Chronic myelogenous leukemia (CML); chronic phase.   - He has achieved hematologic response at 3 months,  Cytogenetic response is pending cytology from BM bx from 08/07/12; molecular response was adequate with <10% BCR/ABL per FISH.   - He had fatigue, cytopenia, anorexia with Sprycel 100mg  PO daily.  I recommend him to continue Sprycel at 50mg  PO daily at this time.  He expressed informed understanding and wished to continue as instructed.   6.  Follow up:  In about 3 months.  Repeat CBC monthly for the next 3 months to check CBC.  If his CBC is normal the next 3 months, we may space out CBC monitoring.      The length of time of the face-to-face encounter was 25 minutes. More than 50% of time was spent counseling and coordination of care.

## 2012-08-22 ENCOUNTER — Other Ambulatory Visit: Payer: Medicare Other

## 2012-08-29 ENCOUNTER — Other Ambulatory Visit: Payer: Medicare Other | Admitting: Lab

## 2012-09-05 ENCOUNTER — Other Ambulatory Visit: Payer: Medicare Other | Admitting: Lab

## 2012-09-12 ENCOUNTER — Telehealth: Payer: Self-pay | Admitting: *Deleted

## 2012-09-12 ENCOUNTER — Other Ambulatory Visit (HOSPITAL_BASED_OUTPATIENT_CLINIC_OR_DEPARTMENT_OTHER): Payer: Medicare Other | Admitting: Lab

## 2012-09-12 ENCOUNTER — Other Ambulatory Visit: Payer: Medicare Other | Admitting: Lab

## 2012-09-12 DIAGNOSIS — C9210 Chronic myeloid leukemia, BCR/ABL-positive, not having achieved remission: Secondary | ICD-10-CM

## 2012-09-12 LAB — CBC WITH DIFFERENTIAL/PLATELET
BASO%: 0.8 % (ref 0.0–2.0)
Basophils Absolute: 0 10*3/uL (ref 0.0–0.1)
EOS%: 3.6 % (ref 0.0–7.0)
HGB: 11.9 g/dL — ABNORMAL LOW (ref 13.0–17.1)
MCH: 30.3 pg (ref 27.2–33.4)
MCHC: 32.5 g/dL (ref 32.0–36.0)
MCV: 93.4 fL (ref 79.3–98.0)
MONO%: 9.3 % (ref 0.0–14.0)
RDW: 14.8 % — ABNORMAL HIGH (ref 11.0–14.6)
lymph#: 1 10*3/uL (ref 0.9–3.3)

## 2012-09-12 NOTE — Telephone Encounter (Signed)
Called pt and informed of labwork stable.  Continue same dose,  50 mg, of Sprycel and keep lab appt next month as scheduled.  He verbalized understanding.

## 2012-09-12 NOTE — Telephone Encounter (Signed)
Message copied by Wende Mott on Wed Sep 12, 2012  3:30 PM ------      Message from: HA, Raliegh Ip T      Created: Wed Sep 12, 2012 10:39 AM       Please call pt.  Continue med for CML at current dose.  No change.

## 2012-09-19 ENCOUNTER — Other Ambulatory Visit: Payer: Medicare Other | Admitting: Lab

## 2012-09-22 ENCOUNTER — Other Ambulatory Visit: Payer: Self-pay

## 2012-10-03 ENCOUNTER — Other Ambulatory Visit: Payer: Medicare Other | Admitting: Lab

## 2012-10-10 ENCOUNTER — Other Ambulatory Visit (HOSPITAL_BASED_OUTPATIENT_CLINIC_OR_DEPARTMENT_OTHER): Payer: Medicare Other | Admitting: Lab

## 2012-10-10 ENCOUNTER — Telehealth: Payer: Self-pay | Admitting: *Deleted

## 2012-10-10 DIAGNOSIS — C9210 Chronic myeloid leukemia, BCR/ABL-positive, not having achieved remission: Secondary | ICD-10-CM

## 2012-10-10 LAB — CBC WITH DIFFERENTIAL/PLATELET
EOS%: 2.8 % (ref 0.0–7.0)
Eosinophils Absolute: 0.2 10*3/uL (ref 0.0–0.5)
HGB: 12.6 g/dL — ABNORMAL LOW (ref 13.0–17.1)
MCH: 30.7 pg (ref 27.2–33.4)
MCV: 91.7 fL (ref 79.3–98.0)
MONO%: 9.1 % (ref 0.0–14.0)
NEUT#: 4 10*3/uL (ref 1.5–6.5)
RBC: 4.1 10*6/uL — ABNORMAL LOW (ref 4.20–5.82)
RDW: 14.9 % — ABNORMAL HIGH (ref 11.0–14.6)
lymph#: 1 10*3/uL (ref 0.9–3.3)

## 2012-10-10 NOTE — Telephone Encounter (Signed)
Message copied by Wende Mott on Wed Oct 10, 2012 11:48 AM ------      Message from: HA, Raliegh Ip T      Created: Wed Oct 10, 2012  8:59 AM       Please call pt and advise him to continue Sprycel at current dose.  Thanks. ------

## 2012-10-10 NOTE — Telephone Encounter (Signed)
Called pt w/ lab results and instructed to continue same dose Sprycel (50 mg daily).  Keep next appt on 4/09 as scheduled.  Pt verbalized understanding.

## 2012-11-01 ENCOUNTER — Other Ambulatory Visit: Payer: Self-pay | Admitting: Family Medicine

## 2012-11-01 DIAGNOSIS — I6521 Occlusion and stenosis of right carotid artery: Secondary | ICD-10-CM

## 2012-11-08 ENCOUNTER — Encounter (INDEPENDENT_AMBULATORY_CARE_PROVIDER_SITE_OTHER): Payer: Medicare Other

## 2012-11-08 DIAGNOSIS — I6529 Occlusion and stenosis of unspecified carotid artery: Secondary | ICD-10-CM

## 2012-11-08 DIAGNOSIS — I6521 Occlusion and stenosis of right carotid artery: Secondary | ICD-10-CM

## 2012-11-14 ENCOUNTER — Other Ambulatory Visit (HOSPITAL_BASED_OUTPATIENT_CLINIC_OR_DEPARTMENT_OTHER): Payer: Medicare Other

## 2012-11-14 ENCOUNTER — Ambulatory Visit (HOSPITAL_BASED_OUTPATIENT_CLINIC_OR_DEPARTMENT_OTHER): Payer: Medicare Other | Admitting: Oncology

## 2012-11-14 ENCOUNTER — Telehealth: Payer: Self-pay | Admitting: Oncology

## 2012-11-14 ENCOUNTER — Encounter: Payer: Self-pay | Admitting: Oncology

## 2012-11-14 VITALS — BP 134/63 | HR 59 | Temp 98.0°F | Ht 69.0 in | Wt 202.3 lb

## 2012-11-14 DIAGNOSIS — I10 Essential (primary) hypertension: Secondary | ICD-10-CM

## 2012-11-14 DIAGNOSIS — C9210 Chronic myeloid leukemia, BCR/ABL-positive, not having achieved remission: Secondary | ICD-10-CM

## 2012-11-14 LAB — CBC WITH DIFFERENTIAL/PLATELET
BASO%: 0.5 % (ref 0.0–2.0)
EOS%: 2.3 % (ref 0.0–7.0)
HCT: 39.1 % (ref 38.4–49.9)
LYMPH%: 21 % (ref 14.0–49.0)
MCH: 29.9 pg (ref 27.2–33.4)
MCHC: 32.2 g/dL (ref 32.0–36.0)
NEUT%: 68.3 % (ref 39.0–75.0)
Platelets: 148 10*3/uL (ref 140–400)
RBC: 4.21 10*6/uL (ref 4.20–5.82)
lymph#: 1.2 10*3/uL (ref 0.9–3.3)

## 2012-11-14 LAB — COMPREHENSIVE METABOLIC PANEL (CC13)
ALT: 8 U/L (ref 0–55)
AST: 12 U/L (ref 5–34)
Creatinine: 1 mg/dL (ref 0.7–1.3)
Sodium: 143 mEq/L (ref 136–145)
Total Bilirubin: 0.52 mg/dL (ref 0.20–1.20)

## 2012-11-14 NOTE — Progress Notes (Signed)
[facility]  Telephone:(336) [phone] Fax:(336) [phone]   OFFICE PROGRESS NOTE   Cc:  PICKARD,WARREN TOM, MD  DIAGNOSIS: Chronic phase CML; presented with peripheral blood bcr/abl (b2a2/b3a2 subtype) of 289.75% on 05/03/2012.     CURRENT THERAPY: Sprycel 100 mg daily started 05/09/12. Dose was reduced to 50 mg daily on 06/06/12 due to pancytopenia.   INTERVAL HISTORY: Anthony Wolfe 67 y.o. male returns for regular follow up with his wife.  With dose reduction of Sprycel from 100mg  to 50mg  PO daily, his fatigue, anorexia completely resolved.   Patient now denies fever, anorexia, weight loss, fatigue, headache, visual changes, confusion, drenching night sweats, palpable lymph node swelling, mucositis, odynophagia, dysphagia, nausea vomiting, jaundice, chest pain, palpitation, shortness of breath, dyspnea on exertion, productive cough, gum bleeding, epistaxis, hematemesis, hemoptysis, abdominal pain, abdominal swelling, early satiety, melena, hematochezia, hematuria, skin rash, spontaneous bleeding, joint swelling, joint pain, heat or cold intolerance, bowel bladder incontinence, back pain, focal motor weakness, paresthesia, depression, suicidal or homicidal ideation, feeling hopelessness.   Past Medical History  Diagnosis Date  . Lymphocytosis   . Carotid disease, bilateral   . Hyperlipidemia   . HTN (hypertension)   . Allergic rhinitis   . Prostate hypertrophy   . Anxiety   . CAD (coronary artery disease)   . TIA (transient ischemic attack)   . CML (chronic myelocytic leukemia) 05/09/2012    Past Surgical History  Procedure Laterality Date  . Coronary artery bypass graft  02/2009    3 vessel  . Colonoscopy  07/2010  . Carotidectomy  2003    right side  . Back surgery      Current Outpatient Prescriptions  Medication Sig Dispense Refill  . aspirin 325 MG tablet Take 325 mg by mouth daily.      . citalopram (CELEXA) 20 MG tablet Take 20 mg by mouth  daily.       . dasatinib (SPRYCEL) 50 MG tablet Take 1 tablet (50 mg total) by mouth daily.  30 tablet  0  . furosemide (LASIX) 20 MG tablet Take 20 mg by mouth daily.       . metoprolol tartrate (LOPRESSOR) 25 MG tablet Take 25 mg by mouth 2 (two) times daily.       . simvastatin (ZOCOR) 40 MG tablet Take 40 mg by mouth at bedtime.        No current facility-administered medications for this visit.    ALLERGIES:  is allergic to sulfa antibiotics.  REVIEW OF SYSTEMS:  The rest of the 14-point review of system was negative.   Filed Vitals:   11/14/12 0945  BP: 134/63  Pulse: 59  Temp: 98 F (36.7 C)   Wt Readings from Last 3 Encounters:  11/14/12 202 lb 4.8 oz (91.763 kg)  08/15/12 196 lb 3.2 oz (88.996 kg)  07/11/12 193 lb 6.4 oz (87.726 kg)   ECOG Performance status: 0  PHYSICAL EXAMINATION:   General:  well-nourished man in no acute distress.  Eyes:  no scleral icterus.  ENT:  There were no oropharyngeal lesions.  Neck was without thyromegaly.  Lymphatics:  Negative cervical, supraclavicular or axillary adenopathy.  Respiratory: lungs were clear bilaterally without wheezing or crackles.  Cardiovascular:  Regular rate and rhythm, S1/S2, without murmur, rub or gallop.  There was no pedal edema.  GI:  abdomen was soft, flat, nontender, nondistended, without organomegaly.  Muscoloskeletal:  no spinal tenderness of palpation of vertebral spine.  Skin exam was without  echymosis, petichae.  Neuro exam was nonfocal.  Patient was able to get on and off exam table without assistance.  Gait was normal.  Patient was alerted and oriented.  Attention was good.   Language was appropriate.  Mood was normal without depression.  Speech was not pressured.  Thought content was not tangential.    LABORATORY/RADIOLOGY DATA:  Lab Results  Component Value Date   WBC 5.6 11/14/2012   HGB 12.6* 11/14/2012   HCT 39.1 11/14/2012   PLT 148 11/14/2012   GLUCOSE 108* 11/14/2012   ALKPHOS 77 11/14/2012   ALT 8  11/14/2012   AST 12 11/14/2012   NA 143 11/14/2012   K 3.7 11/14/2012   CL 110* 11/14/2012   CREATININE 1.0 11/14/2012   BUN 14.0 11/14/2012   CO2 25 11/14/2012   INR 1.35 05/03/2012   HGBA1C  Value: 5.5 (NOTE) The ADA recommends the following therapeutic goal for glycemic control related to Hgb A1c measurement: Goal of therapy: <6.5 Hgb A1c  Reference: American Diabetes Association: Clinical Practice Recommendations 2010, Diabetes Care, 2010, 33: (Suppl  1). 02/20/2009    ASSESSMENT AND PLAN:   1. Hypertension: He is on Lasix, Metoprolol.  2. Hyperlipidemia: He is on simvastatin per PCP.   3. CAD: S/p CABG in 2010. He is on ASA, metoprolol, simvastatin per PCP.   4. TIA: S/p right carotid endartectomy.   5. Chronic myelogenous leukemia (CML); chronic phase.   - He has achieved hematologic response at 3 months,  Cytogenetic response is pending cytology from BM bx from 08/07/12; molecular response was adequate with <10% BCR/ABL per FISH.   - He had fatigue, cytopenia, anorexia with Sprycel 100mg  PO daily.  I recommend him to continue Sprycel at 50mg  PO daily at this time.  He expressed informed understanding and wished to continue as instructed.   6.  Follow up:  In about 3 months.  Repeat CBC in 6 months.      The length of time of the face-to-face encounter was 15 minutes. More than 50% of time was spent counseling and coordination of care.

## 2012-11-22 ENCOUNTER — Other Ambulatory Visit: Payer: Self-pay | Admitting: Oncology

## 2012-11-30 ENCOUNTER — Encounter: Payer: Self-pay | Admitting: *Deleted

## 2012-11-30 NOTE — Progress Notes (Signed)
Fax received from [facility], they shipped pt's Sprycel on 4/23 for delivery on 4/24.

## 2012-12-12 ENCOUNTER — Other Ambulatory Visit: Payer: Self-pay | Admitting: Family Medicine

## 2012-12-13 NOTE — Telephone Encounter (Signed)
Refilled per protocol.

## 2012-12-26 ENCOUNTER — Other Ambulatory Visit (HOSPITAL_BASED_OUTPATIENT_CLINIC_OR_DEPARTMENT_OTHER): Payer: Medicare Other | Admitting: Lab

## 2012-12-26 DIAGNOSIS — C9210 Chronic myeloid leukemia, BCR/ABL-positive, not having achieved remission: Secondary | ICD-10-CM

## 2012-12-26 LAB — CBC WITH DIFFERENTIAL/PLATELET
Eosinophils Absolute: 0.1 10*3/uL (ref 0.0–0.5)
HCT: 35.9 % — ABNORMAL LOW (ref 38.4–49.9)
HGB: 11.9 g/dL — ABNORMAL LOW (ref 13.0–17.1)
LYMPH%: 19.4 % (ref 14.0–49.0)
MONO#: 0.6 10*3/uL (ref 0.1–0.9)
NEUT#: 3.9 10*3/uL (ref 1.5–6.5)
NEUT%: 67.4 % (ref 39.0–75.0)
Platelets: 138 10*3/uL — ABNORMAL LOW (ref 140–400)
WBC: 5.8 10*3/uL (ref 4.0–10.3)

## 2012-12-27 ENCOUNTER — Telehealth: Payer: Self-pay | Admitting: *Deleted

## 2012-12-27 NOTE — Telephone Encounter (Signed)
Informed pt of message from [place] below.  Continue same dose of Sprycel and keep next appt as scheduled on July 9.  He verbalized understanding.

## 2012-12-27 NOTE — Telephone Encounter (Signed)
Message copied by Wende Mott on Thu Dec 27, 2012  4:30 PM ------      Message from: Clenton Pare R      Created: Thu Dec 27, 2012  3:53 PM       Please call pt. Counts are a little low, but no change recommended with Sprycel.       ------

## 2013-01-15 NOTE — Progress Notes (Signed)
Spoke to patient concerning his refusal of Sprycel delivery form [facility]; patient states that he no longer wants to take the medication and he wants to cancel all future appointments, but would not elaborate as to why; patient states that he will call office if he needs to see Dr. Gaylyn Rong in the future; [facility] Clydie Braun -- [phone] ext [phone]) contacted and states that they will d/c Sprycel for now until futher notice.

## 2013-02-07 ENCOUNTER — Other Ambulatory Visit: Payer: Self-pay | Admitting: Family Medicine

## 2013-02-11 ENCOUNTER — Telehealth: Payer: Self-pay | Admitting: Oncology

## 2013-02-11 ENCOUNTER — Other Ambulatory Visit: Payer: Self-pay | Admitting: Oncology

## 2013-02-11 NOTE — Telephone Encounter (Signed)
Pt called today to cx July appts and does not wish to r/s. Message to [facility] via [facility].

## 2013-02-13 ENCOUNTER — Ambulatory Visit: Payer: Medicare Other | Admitting: Oncology

## 2013-02-13 ENCOUNTER — Other Ambulatory Visit: Payer: Self-pay | Admitting: *Deleted

## 2013-02-13 ENCOUNTER — Other Ambulatory Visit (HOSPITAL_BASED_OUTPATIENT_CLINIC_OR_DEPARTMENT_OTHER): Payer: Medicare Other | Admitting: Lab

## 2013-02-13 ENCOUNTER — Other Ambulatory Visit: Payer: Medicare Other | Admitting: Lab

## 2013-02-13 ENCOUNTER — Telehealth: Payer: Self-pay | Admitting: Oncology

## 2013-02-13 ENCOUNTER — Ambulatory Visit (HOSPITAL_BASED_OUTPATIENT_CLINIC_OR_DEPARTMENT_OTHER): Payer: Medicare Other | Admitting: Oncology

## 2013-02-13 VITALS — BP 127/59 | HR 74 | Temp 98.1°F | Resp 18 | Ht 69.0 in | Wt 197.9 lb

## 2013-02-13 DIAGNOSIS — C9210 Chronic myeloid leukemia, BCR/ABL-positive, not having achieved remission: Secondary | ICD-10-CM

## 2013-02-13 LAB — CBC WITH DIFFERENTIAL/PLATELET
BASO%: 1.1 % (ref 0.0–2.0)
Basophils Absolute: 0.1 10*3/uL (ref 0.0–0.1)
EOS%: 1.4 % (ref 0.0–7.0)
HCT: 40 % (ref 38.4–49.9)
LYMPH%: 21.3 % (ref 14.0–49.0)
MCH: 31.4 pg (ref 27.2–33.4)
MCHC: 34.4 g/dL (ref 32.0–36.0)
MONO#: 0.6 10*3/uL (ref 0.1–0.9)
NEUT%: 68.6 % (ref 39.0–75.0)
Platelets: 175 10*3/uL (ref 140–400)

## 2013-02-13 LAB — COMPREHENSIVE METABOLIC PANEL (CC13)
ALT: 17 U/L (ref 0–55)
BUN: 16.7 mg/dL (ref 7.0–26.0)
CO2: 29 mEq/L (ref 22–29)
Creatinine: 1.2 mg/dL (ref 0.7–1.3)
Total Bilirubin: 0.58 mg/dL (ref 0.20–1.20)

## 2013-02-13 MED ORDER — DASATINIB 50 MG PO TABS: 50.0000 mg | ORAL_TABLET | Freq: Every day | ORAL | Status: DC

## 2013-02-13 NOTE — Telephone Encounter (Signed)
Faxed Rx for Sprycel to [facility] at fax #[phone].

## 2013-02-13 NOTE — Telephone Encounter (Signed)
gave pt appt for lab every month for 6 months and see MD on Octotober 2014 °

## 2013-02-14 NOTE — Progress Notes (Signed)
[facility]  Telephone:(336) [phone] Fax:(336) [phone]   OFFICE PROGRESS NOTE   Cc:  PICKARD,WARREN TOM, MD  DIAGNOSIS: Chronic phase CML; presented with peripheral blood bcr/abl (b2a2/b3a2 subtype) of 289.75% on 05/03/2012.     CURRENT THERAPY: Sprycel 100 mg daily started 05/09/12. Dose was reduced to 50 mg daily on 06/06/12 due to pancytopenia.   INTERVAL HISTORY: Anthony Wolfe 67 y.o. male returns for regular follow up with his wife.  He ran out of Sprycel late 12/2012.  For unknown reason, he did not refill it despite having refills from [place].  He tolerated it well without problem.   Patient denies fever, anorexia, weight loss, fatigue, headache, visual changes, confusion, drenching night sweats, palpable lymph node swelling, mucositis, odynophagia, dysphagia, nausea vomiting, jaundice, chest pain, palpitation, shortness of breath, dyspnea on exertion, productive cough, gum bleeding, epistaxis, hematemesis, hemoptysis, abdominal pain, abdominal swelling, early satiety, melena, hematochezia, hematuria, skin rash, spontaneous bleeding, joint swelling, joint pain, heat or cold intolerance, bowel bladder incontinence, back pain, focal motor weakness, paresthesia, depression, suicidal or homicidal ideation, feeling hopelessness.    Past Medical History  Diagnosis Date  . Lymphocytosis   . Carotid disease, bilateral   . Hyperlipidemia   . HTN (hypertension)   . Allergic rhinitis   . Prostate hypertrophy   . Anxiety   . CAD (coronary artery disease)   . TIA (transient ischemic attack)   . CML (chronic myelocytic leukemia) 05/09/2012    Past Surgical History  Procedure Laterality Date  . Coronary artery bypass graft  02/2009    3 vessel  . Colonoscopy  07/2010  . Carotidectomy  2003    right side  . Back surgery      Current Outpatient Prescriptions  Medication Sig Dispense Refill  . aspirin 325 MG tablet Take 325 mg by mouth daily.      . citalopram  (CELEXA) 20 MG tablet Take 20 mg by mouth daily.       . furosemide (LASIX) 20 MG tablet Take 20 mg by mouth daily.       . metoprolol tartrate (LOPRESSOR) 25 MG tablet Take 25 mg by mouth 2 (two) times daily.       . simvastatin (ZOCOR) 40 MG tablet TAKE ONE TABLET BY MOUTH AT BEDTIME  30 tablet  5  . dasatinib (SPRYCEL) 50 MG tablet Take 1 tablet (50 mg total) by mouth daily.  90 tablet  1   No current facility-administered medications for this visit.    ALLERGIES:  is allergic to sulfa antibiotics.  REVIEW OF SYSTEMS:  The rest of the 14-point review of system was negative.   Filed Vitals:   02/13/13 0952  BP: 127/59  Pulse: 74  Temp: 98.1 F (36.7 C)  Resp: 18   Wt Readings from Last 3 Encounters:  02/13/13 197 lb 14.4 oz (89.767 kg)  11/14/12 202 lb 4.8 oz (91.763 kg)  08/15/12 196 lb 3.2 oz (88.996 kg)   ECOG Performance status: 0  PHYSICAL EXAMINATION:   General:  well-nourished man in no acute distress.  Eyes:  no scleral icterus.  ENT:  There were no oropharyngeal lesions.  Neck was without thyromegaly.  Lymphatics:  Negative cervical, supraclavicular or axillary adenopathy.  Respiratory: lungs were clear bilaterally without wheezing or crackles.  Cardiovascular:  Regular rate and rhythm, S1/S2, without murmur, rub or gallop.  There was no pedal edema.  GI:  abdomen was soft, flat, nontender, nondistended, without organomegaly.  Muscoloskeletal:  no spinal tenderness of palpation of vertebral spine.  Skin exam was without echymosis, petichae.  Neuro exam was nonfocal.  Patient was able to get on and off exam table without assistance.  Gait was normal.  Patient was alert and oriented.  Attention was good.   Language was appropriate.  Mood was normal without depression.  Speech was not pressured.  Thought content was not tangential.    LABORATORY/RADIOLOGY DATA:  Lab Results  Component Value Date   WBC 7.4 02/13/2013   HGB 13.8 02/13/2013   HCT 40.0 02/13/2013   PLT 175  02/13/2013   GLUCOSE 110 02/13/2013   ALKPHOS 91 02/13/2013   ALT 17 02/13/2013   AST 16 02/13/2013   NA 141 02/13/2013   K 4.1 02/13/2013   CL 110* 11/14/2012   CREATININE 1.2 02/13/2013   BUN 16.7 02/13/2013   CO2 29 02/13/2013   INR 1.35 05/03/2012   HGBA1C  Value: 5.5 (NOTE) The ADA recommends the following therapeutic goal for glycemic control related to Hgb A1c measurement: Goal of therapy: <6.5 Hgb A1c  Reference: American Diabetes Association: Clinical Practice Recommendations 2010, Diabetes Care, 2010, 33: (Suppl  1). 02/20/2009     ASSESSMENT AND PLAN:   1. Hypertension: He is on Lasix, Metoprolol.  2. Hyperlipidemia: He is on simvastatin per PCP.   3. CAD: S/p CABG in 2010. He is on ASA, metoprolol, simvastatin per PCP.   4. TIA: S/p right carotid endartectomy.   5. Chronic myelogenous leukemia (CML); chronic phase.   - He has achieved hematologic response at 3 months,  Cytogenetic response is pending cytology from BM bx from 08/07/12; molecular response was adequate with <10% BCR/ABL per FISH.   - I stressed the importance of treating CML to prevent it from getting to AML.  He had no dose limiting toxicity with Sprycel 50mg  and he would like to resume this now.   6.  Follow up:  In about 3 months.  Repeat CBC monthly for the next 3 months to check CBC while he resumes Sprycel.       The length of time of the face-to-face encounter was 15 minutes. More than 50% of time was spent counseling and coordination of care.

## 2013-02-18 ENCOUNTER — Other Ambulatory Visit: Payer: Self-pay | Admitting: Family Medicine

## 2013-02-21 ENCOUNTER — Encounter: Payer: Self-pay | Admitting: *Deleted

## 2013-02-21 NOTE — Progress Notes (Signed)
Fax from [facility], they shipped Sprycel on 02/14/13 for delivery on 02/15/13.

## 2013-03-13 ENCOUNTER — Other Ambulatory Visit: Payer: Self-pay

## 2013-03-15 ENCOUNTER — Other Ambulatory Visit (HOSPITAL_BASED_OUTPATIENT_CLINIC_OR_DEPARTMENT_OTHER): Payer: Medicare Other | Admitting: Lab

## 2013-03-15 DIAGNOSIS — C9210 Chronic myeloid leukemia, BCR/ABL-positive, not having achieved remission: Secondary | ICD-10-CM

## 2013-03-15 LAB — CBC WITH DIFFERENTIAL/PLATELET
BASO%: 0.7 % (ref 0.0–2.0)
Eosinophils Absolute: 0.1 10*3/uL (ref 0.0–0.5)
LYMPH%: 22.3 % (ref 14.0–49.0)
MCH: 31.1 pg (ref 27.2–33.4)
MCHC: 33.4 g/dL (ref 32.0–36.0)
MCV: 93.2 fL (ref 79.3–98.0)
MONO%: 9.1 % (ref 0.0–14.0)
Platelets: 156 10*3/uL (ref 140–400)
RBC: 4.04 10*6/uL — ABNORMAL LOW (ref 4.20–5.82)

## 2013-03-18 ENCOUNTER — Telehealth: Payer: Self-pay | Admitting: *Deleted

## 2013-03-18 NOTE — Telephone Encounter (Signed)
Called patient with message below. Pt verbalized understanding.

## 2013-03-18 NOTE — Telephone Encounter (Signed)
Message copied by Phillis Knack on Mon Mar 18, 2013  2:53 PM ------      Message from: Kallie Locks      Created: Mon Mar 18, 2013  2:48 PM                   ----- Message -----         From: Myrtis Ser, NP         Sent: 03/15/2013   2:54 PM           To: Marcell Barlow, RN            Please call pt. Labs are stable. Continue Sprycel without dose modification.             ------

## 2013-04-04 ENCOUNTER — Encounter: Payer: Self-pay | Admitting: Family Medicine

## 2013-04-04 ENCOUNTER — Ambulatory Visit (INDEPENDENT_AMBULATORY_CARE_PROVIDER_SITE_OTHER): Payer: PRIVATE HEALTH INSURANCE | Admitting: Family Medicine

## 2013-04-04 VITALS — BP 140/72 | HR 68 | Temp 98.3°F | Resp 16 | Wt 202.0 lb

## 2013-04-04 DIAGNOSIS — I498 Other specified cardiac arrhythmias: Secondary | ICD-10-CM

## 2013-04-04 DIAGNOSIS — R9431 Abnormal electrocardiogram [ECG] [EKG]: Secondary | ICD-10-CM

## 2013-04-04 DIAGNOSIS — I4902 Ventricular flutter: Secondary | ICD-10-CM

## 2013-04-04 DIAGNOSIS — R002 Palpitations: Secondary | ICD-10-CM

## 2013-04-04 LAB — COMPLETE METABOLIC PANEL WITH GFR
ALT: 17 U/L (ref 0–53)
AST: 14 U/L (ref 0–37)
Alkaline Phosphatase: 88 U/L (ref 39–117)
Chloride: 110 mEq/L (ref 96–112)
Creat: 1.03 mg/dL (ref 0.50–1.35)
Total Bilirubin: 0.8 mg/dL (ref 0.3–1.2)

## 2013-04-04 LAB — CBC WITH DIFFERENTIAL/PLATELET
Basophils Absolute: 0 10*3/uL (ref 0.0–0.1)
Eosinophils Relative: 2 % (ref 0–5)
Lymphocytes Relative: 22 % (ref 12–46)
MCV: 92.2 fL (ref 78.0–100.0)
Platelets: 166 10*3/uL (ref 150–400)
RDW: 15.4 % (ref 11.5–15.5)
WBC: 5.7 10*3/uL (ref 4.0–10.5)

## 2013-04-04 NOTE — Progress Notes (Signed)
Subjective:    Patient ID: Anthony Wolfe, male    DOB: 08-Dec-1945, 67 y.o.   MRN: [id number]  HPI  Patient has had a one-week episode of fluttering in his chest. The episodes last 10-15 seconds. He feels like his heart skips a beat. He denies any chest pain. He denies any syncope or presyncope. He does have baseline dyspnea on exertion related to COPD. He has not had any new dyspnea on exertion. He is drinking his  normal amount of caffeine. He is not currently taking any stimulants or cold medications.  He denies any increased stress or anxiety. He is sleeping like normal. Past Medical History  Diagnosis Date  . Lymphocytosis   . Carotid disease, bilateral   . Hyperlipidemia   . HTN (hypertension)   . Allergic rhinitis   . Prostate hypertrophy   . Anxiety   . CAD (coronary artery disease)   . TIA (transient ischemic attack)   . 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 Coronary artery bypass graft  02/2009    3 vessel  . Colonoscopy  07/2010  . Carotidectomy  2003    right side  . Back surgery     Current Outpatient Prescriptions on File Prior to Visit  Medication Sig Dispense Refill  . aspirin 325 MG tablet Take 325 mg by mouth daily.      . citalopram (CELEXA) 20 MG tablet Take 20 mg by mouth daily.       . dasatinib (SPRYCEL) 50 MG tablet Take 1 tablet (50 mg total) by mouth daily.  90 tablet  1  . furosemide (LASIX) 20 MG tablet Take 20 mg by mouth daily.       . simvastatin (ZOCOR) 40 MG tablet TAKE ONE TABLET BY MOUTH AT BEDTIME  30 tablet  5   No current facility-administered medications on file prior to visit.   Allergies  Allergen Reactions  . Azithromycin     rash  . Norvasc [Amlodipine Besylate]     rash  . Sulfa Antibiotics Hives   History   Social History  . Marital Status: Married    Spouse Name: N/A    Number of Children: 2  . Years of Education: N/A   Occupational History  .      retired  Administrator   Social History Main Topics  . Smoking status: Former Smoker    Quit date: 08/08/1997  . Smokeless tobacco: Current User  . Alcohol Use:   . Drug Use: No  . Sexual Activity:    Other Topics Concern  . Not on file   Social History Narrative  . No narrative on file     Review of Systems  All other systems reviewed and are negative.       Objective:   Physical Exam  Constitutional: He is oriented to person, place, and time. He appears well-developed and well-nourished. No distress.  HENT:  Head: Normocephalic.  Right Ear: External ear normal.  Left Ear: External ear normal.  Nose: Nose normal.  Mouth/Throat: Oropharynx is clear and moist. No oropharyngeal exudate.  Eyes: Conjunctivae and EOM are normal. Pupils are equal, round, and reactive to light. Right eye exhibits no discharge. Left eye exhibits no discharge. No scleral icterus.  Neck: Normal range of motion. Neck supple. No JVD present. No thyromegaly present.  Cardiovascular: Normal rate, regular rhythm, normal heart sounds and intact distal pulses.  Exam reveals no gallop and no  friction rub.   No murmur heard. Pulmonary/Chest: Effort normal and breath sounds normal. No respiratory distress. He has no wheezes. He has no rales. He exhibits no tenderness.  Abdominal: Soft. Bowel sounds are normal. He exhibits no distension and no mass. There is no tenderness. There is no rebound and no guarding.  Lymphadenopathy:    He has no cervical adenopathy.  Neurological: He is alert and oriented to person, place, and time. He has normal reflexes. He displays normal reflexes. No cranial nerve deficit. He exhibits normal muscle tone. Coordination normal.  Skin: He is not diaphoretic.   did auscultate several PVCs on his exam. EKG shows normal sinus rhythm with a normal axis and normal intervals. He does have sinus bradycardia at 56 beats per minute. He also has nonspecific ST segment depression in leads 2,3, and aVF. He  also has nonspecific ST changes in V3 V4 V5 and V6. Lateral ST segment changes are new as well as the changes in lead 2        Assessment & Plan:  1. Fluttering heart  - PR ELECTROCARDIOGRAM, COMPLETE - CBC with Differential - COMPLETE METABOLIC PANEL WITH GFR - TSH - Ambulatory referral to Cardiology  2. Palpitations Palpitations likely represent PVCs. I will obtain an event monitor to rule out significant arrhythmias and heart. Given his EKG changes, his long smoking history, I would recommend a cardiology evaluation for possible stress test of his heart is EKG changes are new. I will also check a CBC rule out anemia as well as electrolyte studies and TSH to rule out reversible causes PVCs. - CBC with Differential - COMPLETE METABOLIC PANEL WITH GFR - TSH - Ambulatory referral to Cardiology  3. EKG abnormalities - Ambulatory referral to Cardiology

## 2013-04-11 ENCOUNTER — Ambulatory Visit (INDEPENDENT_AMBULATORY_CARE_PROVIDER_SITE_OTHER): Payer: PRIVATE HEALTH INSURANCE | Admitting: Family Medicine

## 2013-04-11 DIAGNOSIS — R002 Palpitations: Secondary | ICD-10-CM

## 2013-04-11 DIAGNOSIS — R9431 Abnormal electrocardiogram [ECG] [EKG]: Secondary | ICD-10-CM

## 2013-04-11 NOTE — Progress Notes (Signed)
Patient ID: Anthony Wolfe, male   DOB: [birth year], 67 y.o.   MRN: [id number] Patient here per request of Dr Tanya Nones to have 24 hr Heart Holter Monitor applied.  Monitor applied without problem.  Pt instructed to return tomorrow same time for removal.  Do not get equipment wet.  Given diary sheet to note any heart symptoms felt while monitor on.  Wife also her with patient.  Both acknowledge understanding of instructions

## 2013-04-12 ENCOUNTER — Ambulatory Visit: Payer: PRIVATE HEALTH INSURANCE | Admitting: Family Medicine

## 2013-04-12 DIAGNOSIS — I4902 Ventricular flutter: Secondary | ICD-10-CM

## 2013-04-12 NOTE — Progress Notes (Signed)
Patient ID: Anthony Wolfe, male   DOB: 05-Jan-1946, 67 y.o.   MRN: [id number] Pt wore monitor for 24 hours.  Here for removal.  No problems reported.  Pt id complete diary with some heart fluttering episodes.  Monitor sent to [facility] for reading.  Told patient 1-2 week before results received

## 2013-04-19 ENCOUNTER — Other Ambulatory Visit (HOSPITAL_BASED_OUTPATIENT_CLINIC_OR_DEPARTMENT_OTHER): Payer: Medicare Other | Admitting: Lab

## 2013-04-19 DIAGNOSIS — C9210 Chronic myeloid leukemia, BCR/ABL-positive, not having achieved remission: Secondary | ICD-10-CM

## 2013-04-19 LAB — COMPREHENSIVE METABOLIC PANEL (CC13)
ALT: 10 U/L (ref 0–55)
AST: 13 U/L (ref 5–34)
Calcium: 8.7 mg/dL (ref 8.4–10.4)
Chloride: 110 mEq/L — ABNORMAL HIGH (ref 98–109)
Creatinine: 1 mg/dL (ref 0.7–1.3)
Potassium: 3.6 mEq/L (ref 3.5–5.1)

## 2013-04-19 LAB — CBC WITH DIFFERENTIAL/PLATELET
BASO%: 0.6 % (ref 0.0–2.0)
EOS%: 1.6 % (ref 0.0–7.0)
MCH: 31.4 pg (ref 27.2–33.4)
MCHC: 33.6 g/dL (ref 32.0–36.0)
NEUT%: 69.9 % (ref 39.0–75.0)
RBC: 3.79 10*6/uL — ABNORMAL LOW (ref 4.20–5.82)
RDW: 15.5 % — ABNORMAL HIGH (ref 11.0–14.6)
lymph#: 1.2 10*3/uL (ref 0.9–3.3)

## 2013-04-24 ENCOUNTER — Telehealth: Payer: Self-pay | Admitting: *Deleted

## 2013-04-24 ENCOUNTER — Telehealth: Payer: Self-pay | Admitting: Family Medicine

## 2013-04-24 ENCOUNTER — Other Ambulatory Visit: Payer: Self-pay | Admitting: Family Medicine

## 2013-04-24 MED ORDER — SILDENAFIL CITRATE 100 MG PO TABS: 100.0000 mg | ORAL_TABLET | Freq: Every day | ORAL | Status: DC | PRN

## 2013-04-24 NOTE — Telephone Encounter (Signed)
Message copied by Wende Mott on Wed Apr 24, 2013 10:13 AM ------      Message from: Anthony Wolfe      Created: Mon Apr 22, 2013  1:45 PM       Please call pt. CBC remains stable. Continue Sprycel without dose modification. ------

## 2013-04-24 NOTE — Telephone Encounter (Signed)
Informed pt of Kristin's message below and to continue same dose Sprycel, 50 mg daily,  Keep next appt on 05/17/13 as scheduled.  He verbalized understanding.

## 2013-04-24 NOTE — Telephone Encounter (Signed)
Viagra (pt wants to know if we can call in 100 mg instead of 50 mg and he will cut it in half because they are so expensive)

## 2013-05-13 ENCOUNTER — Telehealth: Payer: Self-pay | Admitting: Hematology and Oncology

## 2013-05-13 NOTE — Telephone Encounter (Signed)
Moved 10/10 lb/fu to 10/21 @ 230pm w/NG. lmonvm for pt re new d/t and mailed scheduled.

## 2013-05-16 ENCOUNTER — Ambulatory Visit: Payer: Medicare Other

## 2013-05-16 ENCOUNTER — Other Ambulatory Visit: Payer: Self-pay | Admitting: *Deleted

## 2013-05-16 NOTE — Progress Notes (Signed)
Fax rec'd from [facility].  They shipped Sprycel on 05/13/13 for delivery next business day.

## 2013-05-17 ENCOUNTER — Other Ambulatory Visit: Payer: Medicare Other | Admitting: Lab

## 2013-05-17 ENCOUNTER — Ambulatory Visit: Payer: Medicare Other

## 2013-05-20 ENCOUNTER — Other Ambulatory Visit (HOSPITAL_COMMUNITY): Payer: Self-pay | Admitting: Cardiology

## 2013-05-20 ENCOUNTER — Encounter: Payer: Self-pay | Admitting: *Deleted

## 2013-05-20 ENCOUNTER — Encounter: Payer: Self-pay | Admitting: Cardiology

## 2013-05-20 DIAGNOSIS — I2510 Atherosclerotic heart disease of native coronary artery without angina pectoris: Secondary | ICD-10-CM

## 2013-05-20 DIAGNOSIS — N400 Enlarged prostate without lower urinary tract symptoms: Secondary | ICD-10-CM | POA: Insufficient documentation

## 2013-05-20 DIAGNOSIS — E785 Hyperlipidemia, unspecified: Secondary | ICD-10-CM | POA: Insufficient documentation

## 2013-05-20 DIAGNOSIS — I10 Essential (primary) hypertension: Secondary | ICD-10-CM | POA: Insufficient documentation

## 2013-05-20 DIAGNOSIS — G459 Transient cerebral ischemic attack, unspecified: Secondary | ICD-10-CM | POA: Insufficient documentation

## 2013-05-20 DIAGNOSIS — I739 Peripheral vascular disease, unspecified: Secondary | ICD-10-CM

## 2013-05-20 DIAGNOSIS — J309 Allergic rhinitis, unspecified: Secondary | ICD-10-CM | POA: Insufficient documentation

## 2013-05-20 DIAGNOSIS — F419 Anxiety disorder, unspecified: Secondary | ICD-10-CM | POA: Insufficient documentation

## 2013-05-21 ENCOUNTER — Ambulatory Visit (INDEPENDENT_AMBULATORY_CARE_PROVIDER_SITE_OTHER): Payer: PRIVATE HEALTH INSURANCE | Admitting: Family Medicine

## 2013-05-21 DIAGNOSIS — Z23 Encounter for immunization: Secondary | ICD-10-CM

## 2013-05-22 ENCOUNTER — Other Ambulatory Visit: Payer: Self-pay | Admitting: Family Medicine

## 2013-05-23 ENCOUNTER — Encounter: Payer: Self-pay | Admitting: Cardiology

## 2013-05-23 ENCOUNTER — Ambulatory Visit (HOSPITAL_COMMUNITY): Payer: Medicare Other | Attending: Cardiology

## 2013-05-23 ENCOUNTER — Ambulatory Visit (INDEPENDENT_AMBULATORY_CARE_PROVIDER_SITE_OTHER): Payer: Medicare Other | Admitting: Cardiology

## 2013-05-23 VITALS — BP 134/68 | HR 56 | Ht 69.0 in | Wt 201.8 lb

## 2013-05-23 DIAGNOSIS — I079 Rheumatic tricuspid valve disease, unspecified: Secondary | ICD-10-CM | POA: Insufficient documentation

## 2013-05-23 DIAGNOSIS — J449 Chronic obstructive pulmonary disease, unspecified: Secondary | ICD-10-CM | POA: Insufficient documentation

## 2013-05-23 DIAGNOSIS — I493 Ventricular premature depolarization: Secondary | ICD-10-CM

## 2013-05-23 DIAGNOSIS — I10 Essential (primary) hypertension: Secondary | ICD-10-CM

## 2013-05-23 DIAGNOSIS — R0989 Other specified symptoms and signs involving the circulatory and respiratory systems: Secondary | ICD-10-CM | POA: Insufficient documentation

## 2013-05-23 DIAGNOSIS — I2510 Atherosclerotic heart disease of native coronary artery without angina pectoris: Secondary | ICD-10-CM | POA: Insufficient documentation

## 2013-05-23 DIAGNOSIS — R9431 Abnormal electrocardiogram [ECG] [EKG]: Secondary | ICD-10-CM | POA: Insufficient documentation

## 2013-05-23 DIAGNOSIS — I498 Other specified cardiac arrhythmias: Secondary | ICD-10-CM

## 2013-05-23 DIAGNOSIS — R002 Palpitations: Secondary | ICD-10-CM | POA: Insufficient documentation

## 2013-05-23 DIAGNOSIS — I379 Nonrheumatic pulmonary valve disorder, unspecified: Secondary | ICD-10-CM | POA: Insufficient documentation

## 2013-05-23 DIAGNOSIS — I4949 Other premature depolarization: Secondary | ICD-10-CM

## 2013-05-23 DIAGNOSIS — E785 Hyperlipidemia, unspecified: Secondary | ICD-10-CM | POA: Insufficient documentation

## 2013-05-23 DIAGNOSIS — R001 Bradycardia, unspecified: Secondary | ICD-10-CM

## 2013-05-23 DIAGNOSIS — J4489 Other specified chronic obstructive pulmonary disease: Secondary | ICD-10-CM | POA: Insufficient documentation

## 2013-05-23 DIAGNOSIS — I080 Rheumatic disorders of both mitral and aortic valves: Secondary | ICD-10-CM | POA: Insufficient documentation

## 2013-05-23 DIAGNOSIS — R0609 Other forms of dyspnea: Secondary | ICD-10-CM | POA: Insufficient documentation

## 2013-05-23 MED ORDER — VERAPAMIL HCL ER 180 MG PO TBCR: 180.0000 mg | EXTENDED_RELEASE_TABLET | Freq: Every day | ORAL | Status: DC

## 2013-05-23 NOTE — Patient Instructions (Addendum)
Please Stop Metoprolol  Start Verapamil 180 MG 1 tablet daily  Your physician recommends that you schedule a follow-up appointment in: One week with Dr. Mayford Knife.

## 2013-05-23 NOTE — Progress Notes (Signed)
Echocardiogram performed.  

## 2013-05-23 NOTE — Progress Notes (Signed)
[address] [place], [place]  [address] Phone: [phone] Fax:  [phone]  Date:  05/23/2013   ID:  Anthony Wolfe, DOB [birth year], MRN [id number]  PCP:  Leo Grosser, MD  Cardiologist:  Armanda Magic, MD     History of Present Illness: Anthony Wolfe is a 67 y.o. male with a history of CAD, HTN, ischemic cardiomyopathy that has resolved and dyslipidemia.  He is doing well.  He denies any chest pain, LE edema, dizziness, palpitations or syncope. He has chronic DOE which is stable.   He was recently complaining of palpitations and wore a heart monitor.  The monitor showed PVC's with PVC load of 22% and he was supposed to increase his beta blocker but we could not get in touch with him.  A nuclear stress test was done which showed no ischemia and echo showed low normal LVF with EF 50%.  Echo also showed moderate PI and mildly enlarged RV/moderately enlarged RA.   Wt Readings from Last 3 Encounters:  05/23/13 201 lb 12.8 oz (91.536 kg)  04/04/13 202 lb (91.627 kg)  02/13/13 197 lb 14.4 oz (89.767 kg)     Past Medical History  Diagnosis Date  . Lymphocytosis   . Hyperlipidemia   . HTN (hypertension)   . Allergic rhinitis   . Prostate hypertrophy   . Anxiety   . CAD (coronary artery disease)     ASCVD, borderline left main and single-vessel Occluded RCA, 40% left main, catheterization 11/2002 [facility] Ohm)  . TIA (transient ischemic attack)     ASPVD, S./P. right CEA, 2002, and known occluded left carotid  . 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 Ischemic cardiomyopathy     EF 36% now resolved  . Carotid disease, bilateral     right CEA, known occluded left carotid  . Depression     Current Outpatient Prescriptions  Medication Sig Dispense Refill  . aspirin 325 MG tablet Take 325 mg by mouth daily.      . citalopram (CELEXA) 20 MG tablet Take 20 mg by mouth daily.       . dasatinib (SPRYCEL) 50 MG tablet Take 1 tablet (50 mg total) by mouth daily.  90  tablet  1  . furosemide (LASIX) 20 MG tablet Take 20 mg by mouth daily.       . metoprolol (LOPRESSOR) 50 MG tablet TAKE ONE TABLET BY MOUTH ONCE DAILY  90 tablet  0  . simvastatin (ZOCOR) 40 MG tablet TAKE ONE TABLET BY MOUTH AT BEDTIME  30 tablet  5  . VIAGRA 100 MG tablet TAKE ONE TABLET BY MOUTH EVERY DAY AS NEEDED  10 tablet  5   No current facility-administered medications for this visit.    Allergies:    Allergies  Allergen Reactions  . Azithromycin     rash  . Norvasc [Amlodipine Besylate]     rash  . Sulfa Antibiotics Hives    Social History:  The patient  reports that he quit smoking about 15 years ago. He uses smokeless tobacco. He reports that he does not use illicit drugs.   Family History:  The patient's family history includes Alcohol abuse in his father; Heart disease in his mother.   ROS:  Please see the history of present illness.      All other systems reviewed and negative.   PHYSICAL EXAM: VS:  BP 134/68  Pulse 56  Ht 5\' 9"  (1.753 m)  Wt 201 lb 12.8 oz (91.536  kg)  BMI 29.79 kg/m2  SpO2 98% Well nourished, well developed, in no acute distress HEENT: normal Neck: no JVD Cardiac:  normal S1, S2; RRR; no murmur Lungs:  clear to auscultation bilaterally, no wheezing, rhonchi or rales Abd: soft, nontender, no hepatomegaly Ext: no edema Skin: warm and dry Neuro:  CNs 2-12 intact, no focal abnormalities noted       ASSESSMENT AND PLAN:  1. ASCAD  - continue ASA 2. HTN  - stopping metoprolol and starting Verapamil to see if PVC's stop 3. Dyslipidemia  - continue Simvastatin 4. Ischemic DCM resolved 5. Enlarged RV/RA with no pulmonary HTN on echo.  He used to be a smoker and has chronic DOE so I will get PFTs to see if he has COPD.   Symptomatic PVC's with PVC load 22% on Holter.  His HR on EKG today in NSR with 49bpm so I cannot increase beta blocker further.  Discussed case with Dr. Johney Frame.  Options are attempted PVC ablation vs. Stop beta blocker  and start Verapamil to see if PVC's stop.  Will stop Lopressor and start Verapamil 180mg  daily.   Followup with me in 1 week  Signed, Armanda Magic, MD 05/23/2013 2:19 PM

## 2013-05-27 ENCOUNTER — Other Ambulatory Visit: Payer: Self-pay | Admitting: Hematology and Oncology

## 2013-05-27 DIAGNOSIS — C9210 Chronic myeloid leukemia, BCR/ABL-positive, not having achieved remission: Secondary | ICD-10-CM

## 2013-05-28 ENCOUNTER — Ambulatory Visit (HOSPITAL_BASED_OUTPATIENT_CLINIC_OR_DEPARTMENT_OTHER): Payer: Medicare Other | Admitting: Hematology and Oncology

## 2013-05-28 ENCOUNTER — Encounter: Payer: Self-pay | Admitting: Hematology and Oncology

## 2013-05-28 ENCOUNTER — Other Ambulatory Visit (HOSPITAL_BASED_OUTPATIENT_CLINIC_OR_DEPARTMENT_OTHER): Payer: Medicare Other | Admitting: Lab

## 2013-05-28 VITALS — BP 162/74 | HR 84 | Temp 97.4°F | Resp 18 | Ht 69.0 in | Wt 203.5 lb

## 2013-05-28 DIAGNOSIS — M62838 Other muscle spasm: Secondary | ICD-10-CM | POA: Insufficient documentation

## 2013-05-28 DIAGNOSIS — C9210 Chronic myeloid leukemia, BCR/ABL-positive, not having achieved remission: Secondary | ICD-10-CM

## 2013-05-28 DIAGNOSIS — I10 Essential (primary) hypertension: Secondary | ICD-10-CM

## 2013-05-28 LAB — COMPREHENSIVE METABOLIC PANEL (CC13)
ALT: 7 U/L (ref 0–55)
AST: 10 U/L (ref 5–34)
Albumin: 3.5 g/dL (ref 3.5–5.0)
Alkaline Phosphatase: 88 U/L (ref 40–150)
Anion Gap: 9 mEq/L (ref 3–11)
BUN: 12.4 mg/dL (ref 7.0–26.0)
CO2: 24 mEq/L (ref 22–29)
Calcium: 9 mg/dL (ref 8.4–10.4)
Chloride: 111 mEq/L — ABNORMAL HIGH (ref 98–109)
Creatinine: 0.9 mg/dL (ref 0.7–1.3)
Glucose: 102 mg/dl (ref 70–140)
Potassium: 3.8 mEq/L (ref 3.5–5.1)
Sodium: 144 mEq/L (ref 136–145)
Total Bilirubin: 0.55 mg/dL (ref 0.20–1.20)
Total Protein: 6.9 g/dL (ref 6.4–8.3)

## 2013-05-28 MED ORDER — CYCLOBENZAPRINE HCL 10 MG PO TABS: 10.0000 mg | ORAL_TABLET | Freq: Three times a day (TID) | ORAL | Status: DC | PRN

## 2013-05-28 NOTE — Progress Notes (Signed)
[facility] OFFICE PROGRESS NOTE  Patient Care Team: [name], MD as PCP - General (Family Medicine) Francisca December, MD as Attending Physician (Cardiology) Exie Parody, MD (Hematology and Oncology) [name], MD (Family Medicine) [name], MD (Cardiothoracic Surgery) Larina Earthly, MD as Attending Physician (Vascular Surgery)  DIAGNOSIS: CML in chronic phase  SUMMARY OF ONCOLOGIC HISTORY: Chronic phase CML; presented with peripheral blood bcr/abl (b2a2/b3a2 subtype) of 289.75% on 05/03/2012.     Sprycel 100 mg daily started 05/09/12. Dose was reduced to 50 mg daily on 06/06/12 due to pancytopenia. In June of 2014, the patient missed dasatinib for several months because his refills run out. He was subsequently restarted back on a statin at with good response.   INTERVAL HISTORY: Anthony Wolfe 67 y.o. male returns for further followup. He denies missing a single dose of the pill. He has no problem getting his medications refilled now. The patient remains very active in all activities of daily living. He denies any skin rash, shortness of breath, or diarrhea. He denies any recent fever, chills, night sweats or abnormal weight loss Most recently, he strained his back and complaint of severe flank pain. His blood pressure is high because of this.  I have reviewed the past medical history, past surgical history, social history and family history with the patient and they are unchanged from previous note.  ALLERGIES:  is allergic to azithromycin; norvasc; and sulfa antibiotics.  MEDICATIONS:  Current Outpatient Prescriptions  Medication Sig Dispense Refill  . aspirin 325 MG tablet Take 325 mg by mouth daily.      . citalopram (CELEXA) 20 MG tablet Take 20 mg by mouth daily.       . dasatinib (SPRYCEL) 50 MG tablet Take 1 tablet (50 mg total) by mouth daily.  90 tablet  1  . furosemide (LASIX) 20 MG tablet Take 20 mg by mouth daily.       . simvastatin  (ZOCOR) 40 MG tablet TAKE ONE TABLET BY MOUTH AT BEDTIME  30 tablet  5  . verapamil (CALAN-SR) 180 MG CR tablet Take 1 tablet (180 mg total) by mouth at bedtime.  30 tablet  11  . VIAGRA 100 MG tablet TAKE ONE TABLET BY MOUTH EVERY DAY AS NEEDED  10 tablet  5  . cyclobenzaprine (FLEXERIL) 10 MG tablet Take 1 tablet (10 mg total) by mouth 3 (three) times daily as needed for muscle spasms.  30 tablet  0   No current facility-administered medications for this visit.    REVIEW OF SYSTEMS:   Constitutional: Denies fevers, chills or abnormal weight loss Eyes: Denies blurriness of vision Ears, nose, mouth, throat, and face: Denies mucositis or sore throat Respiratory: Denies cough, dyspnea or wheezes Cardiovascular: Denies palpitation, chest discomfort or lower extremity swelling Gastrointestinal:  Denies nausea, heartburn or change in bowel habits Skin: Denies abnormal skin rashes Lymphatics: Denies new lymphadenopathy or easy bruising Neurological:Denies numbness, tingling or new weaknesses Behavioral/Psych: Mood is stable, no new changes  All other systems were reviewed with the patient and are negative.  PHYSICAL EXAMINATION: ECOG PERFORMANCE STATUS: 1 - Symptomatic but completely ambulatory  Filed Vitals:   05/28/13 1500  BP: 162/74  Pulse: 84  Temp: 97.4 F (36.3 C)  Resp: 18   Filed Weights   05/28/13 1500  Weight: 203 lb 8 oz (92.307 kg)    GENERAL:alert, no distress and comfortable. The patient is mildly obese SKIN: skin color, texture, turgor  are normal, no rashes or significant lesions EYES: normal, Conjunctiva are pink and non-injected, sclera clear OROPHARYNX:no exudate, no erythema and lips, buccal mucosa, and tongue normal  NECK: supple, thyroid normal size, non-tender, without nodularity LYMPH:  no palpable lymphadenopathy in the cervical, axillary or inguinal LUNGS: clear to auscultation and percussion with normal breathing effort HEART: regular rate & rhythm and  no murmurs and no lower extremity edema ABDOMEN:abdomen soft, non-tender and normal bowel sounds. No palpable splenomegaly Musculoskeletal:no cyanosis of digits and no clubbing . He has some flank pain on the right side NEURO: alert & oriented x 3 with fluent speech, no focal motor/sensory deficits  LABORATORY DATA:  I have reviewed the data as listed    Component Value Date/Time   NA 144 05/28/2013 1443   NA 144 04/04/2013 0900   K 3.8 05/28/2013 1443   K 4.6 04/04/2013 0900   CL 110 04/04/2013 0900   CL 110* 11/14/2012 0918   CO2 24 05/28/2013 1443   CO2 27 04/04/2013 0900   GLUCOSE 102 05/28/2013 1443   GLUCOSE 91 04/04/2013 0900   GLUCOSE 108* 11/14/2012 0918   BUN 12.4 05/28/2013 1443   BUN 11 04/04/2013 0900   CREATININE 0.9 05/28/2013 1443   CREATININE 1.03 04/04/2013 0900   CREATININE 1.12 02/27/2009 0425   CALCIUM 9.0 05/28/2013 1443   CALCIUM 9.1 04/04/2013 0900   PROT 6.9 05/28/2013 1443   PROT 6.5 04/04/2013 0900   ALBUMIN 3.5 05/28/2013 1443   ALBUMIN 4.1 04/04/2013 0900   AST 10 05/28/2013 1443   AST 14 04/04/2013 0900   ALT 7 05/28/2013 1443   ALT 17 04/04/2013 0900   ALKPHOS 88 05/28/2013 1443   ALKPHOS 88 04/04/2013 0900   BILITOT 0.55 05/28/2013 1443   BILITOT 0.8 04/04/2013 0900   GFRNONAA >60 02/27/2009 0425   GFRAA  Value: >60        The eGFR has been calculated using the MDRD equation. This calculation has not been validated in all clinical situations. eGFR's persistently <60 mL/min signify possible Chronic Kidney Disease. 02/27/2009 0425    No results found for this basename: SPEP,  UPEP,   kappa and lambda light chains    Lab Results  Component Value Date   WBC 6.2 04/19/2013   NEUTROABS 4.3 04/19/2013   HGB 11.9* 04/19/2013   HCT 35.4* 04/19/2013   MCV 93.5 04/19/2013   PLT 147 04/19/2013      Chemistry      Component Value Date/Time   NA 144 05/28/2013 1443   NA 144 04/04/2013 0900   K 3.8 05/28/2013 1443   K 4.6 04/04/2013 0900   CL 110 04/04/2013 0900    CL 110* 11/14/2012 0918   CO2 24 05/28/2013 1443   CO2 27 04/04/2013 0900   BUN 12.4 05/28/2013 1443   BUN 11 04/04/2013 0900   CREATININE 0.9 05/28/2013 1443   CREATININE 1.03 04/04/2013 0900   CREATININE 1.12 02/27/2009 0425      Component Value Date/Time   CALCIUM 9.0 05/28/2013 1443   CALCIUM 9.1 04/04/2013 0900   ALKPHOS 88 05/28/2013 1443   ALKPHOS 88 04/04/2013 0900   AST 10 05/28/2013 1443   AST 14 04/04/2013 0900   ALT 7 05/28/2013 1443   ALT 17 04/04/2013 0900   BILITOT 0.55 05/28/2013 1443   BILITOT 0.8 04/04/2013 0900     ASSESSMENT:  CML  PLAN:  #1 CML The patient has not achieved molecular response. He has been on treatment  for almost one year but has not achieved a complete response, the most likely cause of this because he missed some of his medications. The patient stated he has been compliant now. I am waiting for his PCR results. If his PCR result is trending upwards, we might have to repeat bone marrow aspirate and biopsy and send off additional testing to rule out mutations. #2 severe muscle spasm This is related to recent injury. I gave him a small prescription of Flexeril to take as needed. I did remind him about potential side effects such as increased sedation, nausea, and constipation. #3 hypertension This is likely exacerbated from severe, uncontrollable back pain. Hopefully once we get that under control his blood pressure were returned back to normal. I will like to see him back in a month's time, pending PCR results. If the results came back that he has achieved complete remission, we can space out his future visit to every 3 months.  Orders Placed This Encounter  Procedures  . Bcr/abl gene rearrangement qnt, PCR    Standing Status: Future     Number of Occurrences:      Standing Expiration Date: 05/28/2014  . CBC with Differential    Standing Status: Future     Number of Occurrences:      Standing Expiration Date: 02/17/2014  . Comprehensive metabolic  panel    Standing Status: Future     Number of Occurrences:      Standing Expiration Date: 05/28/2014   All questions were answered. The patient knows to call the clinic with any problems, questions or concerns. No barriers to learning was detected.   [facility], Zeek Rostron, MD 05/28/2013 4:18 PM

## 2013-05-29 ENCOUNTER — Telehealth: Payer: Self-pay | Admitting: Hematology and Oncology

## 2013-05-29 NOTE — Telephone Encounter (Signed)
s.w. pt and advised on NOV appts...pt ok and awre

## 2013-05-30 ENCOUNTER — Encounter: Payer: Self-pay | Admitting: Cardiology

## 2013-05-30 ENCOUNTER — Ambulatory Visit (INDEPENDENT_AMBULATORY_CARE_PROVIDER_SITE_OTHER): Payer: Medicare Other | Admitting: Cardiology

## 2013-05-30 VITALS — BP 140/62 | HR 62 | Ht 69.0 in | Wt 203.0 lb

## 2013-05-30 DIAGNOSIS — I2510 Atherosclerotic heart disease of native coronary artery without angina pectoris: Secondary | ICD-10-CM

## 2013-05-30 DIAGNOSIS — I4949 Other premature depolarization: Secondary | ICD-10-CM

## 2013-05-30 DIAGNOSIS — I493 Ventricular premature depolarization: Secondary | ICD-10-CM

## 2013-05-30 DIAGNOSIS — I10 Essential (primary) hypertension: Secondary | ICD-10-CM

## 2013-05-30 DIAGNOSIS — R0602 Shortness of breath: Secondary | ICD-10-CM

## 2013-05-30 MED ORDER — VERAPAMIL HCL ER 180 MG PO TBCR: 180.0000 mg | EXTENDED_RELEASE_TABLET | Freq: Every day | ORAL | Status: DC

## 2013-05-30 NOTE — Progress Notes (Signed)
[address] [place], [place]  [address] Phone: [phone] Fax:  [phone]  Date:  05/30/2013   ID:  Anthony Wolfe, DOB 11-19-1945, MRN [id number]  PCP:  Leo Grosser, MD  Cardiologist:  Armanda Magic, MD     History of Present Illness: Anthony Wolfe is a 67 y.o. male with a history of CAD, HTN, ischemic cardiomyopathy that has resolved and dyslipidemia. He is doing well. He denies any chest pain, LE edema, dizziness, palpitations or syncope. He has chronic DOE which is stable. He was recently complaining of palpitations and wore a heart monitor. The monitor showed PVC's with PVC load of 22% and he was supposed to increase his beta blocker but we could not get in touch with him. A nuclear stress test was done which showed no ischemia and echo showed low normal LVF with EF 50%. Echo also showed moderate PI and mildly enlarged RV/moderately enlarged RA.  At his last OV he was started on Verapamil.  He presents back today doing very well.  His palpitations have completely resolved and so has his SOB.  He tolerates the Verapamil well.    Wt Readings from Last 3 Encounters:  05/30/13 203 lb (92.08 kg)  05/28/13 203 lb 8 oz (92.307 kg)  05/23/13 201 lb 12.8 oz (91.536 kg)     Past Medical History  Diagnosis Date  . Lymphocytosis   . Hyperlipidemia   . HTN (hypertension)   . Allergic rhinitis   . Prostate hypertrophy   . Anxiety   . CAD (coronary artery disease)     ASCVD, borderline left main and single-vessel Occluded RCA, 40% left main, catheterization 11/2002 [facility] Ohm)  . TIA (transient ischemic attack)     ASPVD, S./P. right CEA, 2002, and known occluded left carotid  . 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 Ischemic cardiomyopathy     EF 36% now resolved  . Carotid disease, bilateral     right CEA, known occluded left carotid  . Depression   . Arrhythmia     PVC's with Holter showing PVC load of 22%  . Muscle spasm 05/28/2013    Current  Outpatient Prescriptions  Medication Sig Dispense Refill  . aspirin 325 MG tablet Take 325 mg by mouth daily.      . citalopram (CELEXA) 20 MG tablet Take 20 mg by mouth daily.       . cyclobenzaprine (FLEXERIL) 10 MG tablet Take 1 tablet (10 mg total) by mouth 3 (three) times daily as needed for muscle spasms.  30 tablet  0  . dasatinib (SPRYCEL) 50 MG tablet Take 1 tablet (50 mg total) by mouth daily.  90 tablet  1  . furosemide (LASIX) 20 MG tablet Take 20 mg by mouth daily.       . simvastatin (ZOCOR) 40 MG tablet TAKE ONE TABLET BY MOUTH AT BEDTIME  30 tablet  5  . verapamil (CALAN-SR) 180 MG CR tablet Take 1 tablet (180 mg total) by mouth at bedtime.  30 tablet  11  . VIAGRA 100 MG tablet TAKE ONE TABLET BY MOUTH EVERY DAY AS NEEDED  10 tablet  5   No current facility-administered medications for this visit.    Allergies:    Allergies  Allergen Reactions  . Azithromycin     rash  . Norvasc [Amlodipine Besylate]     rash  . Sulfa Antibiotics Hives    Social History:  The patient  reports that he quit smoking about  15 years ago. He has quit using smokeless tobacco. He reports that he does not drink alcohol or use illicit drugs.   Family History:  The patient's family history includes Alcohol abuse in his father; Heart disease in his mother.   ROS:  Please see the history of present illness.      All other systems reviewed and negative.   PHYSICAL EXAM: VS:  BP 140/62  Pulse 62  Ht 5\' 9"  (1.753 m)  Wt 203 lb (92.08 kg)  BMI 29.96 kg/m2  SpO2 98% Well nourished, well developed, in no acute distress HEENT: normal Neck: no JVD Cardiac:  normal S1, S2; RRR; no murmuroccasional ectopy Lungs:  clear to auscultation bilaterally, no wheezing, rhonchi or rales Abd: soft, nontender, no hepatomegaly Ext: no edema Skin: warm and dry Neuro:  CNs 2-12 intact, no focal abnormalities noted       ASSESSMENT AND PLAN:  1. Symptomatic PVC's resolved on Verapamil. 2. ASCAD with no  angina 3. Ischemic DCM resolved 4. Enlarged RV/RV with no pulmonary HTN - awaiting PFTs 5. HTN - well controlled  Followup with me in 6 months Signed, Armanda Magic, MD 05/30/2013 8:56 AM

## 2013-05-30 NOTE — Patient Instructions (Signed)
Your physician has recommended that you have a pulmonary function test. Pulmonary Function Tests are a group of tests that measure how well air moves in and out of your lungs.  Your physician recommends that you continue on your current medications as directed. Please refer to the Current Medication list given to you today.  Your physician wants you to follow-up in: 6 month with Dr. Sherlyn Lick will receive a reminder letter in the mail two months in advance. If you don't receive a letter, please call our office to schedule the follow-up appointment.

## 2013-06-06 ENCOUNTER — Encounter: Payer: Self-pay | Admitting: Cardiology

## 2013-06-13 ENCOUNTER — Encounter: Payer: Self-pay | Admitting: *Deleted

## 2013-06-13 ENCOUNTER — Encounter: Payer: Self-pay | Admitting: Family Medicine

## 2013-06-13 ENCOUNTER — Ambulatory Visit (INDEPENDENT_AMBULATORY_CARE_PROVIDER_SITE_OTHER): Payer: PRIVATE HEALTH INSURANCE | Admitting: Family Medicine

## 2013-06-13 VITALS — BP 140/80 | HR 78 | Temp 98.0°F | Resp 18 | Wt 198.0 lb

## 2013-06-13 DIAGNOSIS — N419 Inflammatory disease of prostate, unspecified: Secondary | ICD-10-CM

## 2013-06-13 DIAGNOSIS — R300 Dysuria: Secondary | ICD-10-CM

## 2013-06-13 LAB — URINALYSIS, ROUTINE W REFLEX MICROSCOPIC
Hgb urine dipstick: NEGATIVE
Leukocytes, UA: NEGATIVE
Nitrite: NEGATIVE
Protein, ur: NEGATIVE mg/dL
Urobilinogen, UA: 1 mg/dL (ref 0.0–1.0)
pH: 5.5 (ref 5.0–8.0)

## 2013-06-13 MED ORDER — CIPROFLOXACIN HCL 500 MG PO TABS: 500.0000 mg | ORAL_TABLET | Freq: Two times a day (BID) | ORAL | Status: DC

## 2013-06-13 NOTE — Progress Notes (Signed)
Subjective:    Patient ID: Anthony Wolfe, male    DOB: April 26, 1946, 67 y.o.   MRN: [id number]  HPI Patient reports 2 weeks of dull low back pain.  He also complains of pain in his testicles and his rectal area. He complains of weak urinary stream and mild dysuria. His urinalysis today is essentially normal. Power is plus exam shows a 2+ swollen tender prostate without nodularity. Past Medical History  Diagnosis Date  . Lymphocytosis   . Hyperlipidemia   . HTN (hypertension)   . Allergic rhinitis   . Prostate hypertrophy   . Anxiety   . CAD (coronary artery disease)     ASCVD, borderline left main and single-vessel Occluded RCA, 40% left main, catheterization 11/2002 Samule Ohm)  . TIA (transient ischemic attack)     ASPVD, S./P. right CEA, 2002, and known occluded left carotid  . 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 Ischemic cardiomyopathy     EF 36% now resolved  . Carotid disease, bilateral     right CEA, known occluded left carotid  . Depression   . Arrhythmia     PVC's with Holter showing PVC load of 22%  . Muscle spasm 05/28/2013   Current Outpatient Prescriptions on File Prior to Visit  Medication Sig Dispense Refill  . aspirin 325 MG tablet Take 325 mg by mouth daily.      . citalopram (CELEXA) 20 MG tablet Take 20 mg by mouth daily.       . cyclobenzaprine (FLEXERIL) 10 MG tablet Take 1 tablet (10 mg total) by mouth 3 (three) times daily as needed for muscle spasms.  30 tablet  0  . dasatinib (SPRYCEL) 50 MG tablet Take 1 tablet (50 mg total) by mouth daily.  90 tablet  1  . furosemide (LASIX) 20 MG tablet Take 20 mg by mouth daily.       . simvastatin (ZOCOR) 40 MG tablet TAKE ONE TABLET BY MOUTH AT BEDTIME  30 tablet  5  . verapamil (CALAN-SR) 180 MG CR tablet Take 1 tablet (180 mg total) by mouth at bedtime.  90 tablet  3  . VIAGRA 100 MG tablet TAKE ONE TABLET BY MOUTH EVERY DAY AS NEEDED  10 tablet  5   No current facility-administered medications on file prior  to visit.   Allergies  Allergen Reactions  . Azithromycin     rash  . Norvasc [Amlodipine Besylate]     rash  . Sulfa Antibiotics Hives   History   Social History  . Marital Status: Married    Spouse Name: N/A    Number of Children: 2  . Years of Education: N/A   Occupational History  .      retired Administrator   Social History Main Topics  . Smoking status: Former Smoker    Quit date: 08/08/1997  . Smokeless tobacco: Former Neurosurgeon  . Alcohol Use: No  . Drug Use: No  . Sexual Activity: Not on file   Other Topics Concern  . Not on file   Social History Narrative  . No narrative on file      Review of Systems  All other systems reviewed and are negative.       Objective:   Physical Exam  Vitals reviewed. Cardiovascular: Normal rate and regular rhythm.   Pulmonary/Chest: Effort normal and breath sounds normal. No respiratory distress. He has no wheezes. He has no rales.  Abdominal: Soft. Bowel sounds are normal. He exhibits no distension. There  is no tenderness. There is no rebound.   +2 swollen tender prostate        Assessment & Plan:  1. Dysuria - Urinalysis, Routine w reflex microscopic  2. Prostatitis Begin Cipro 500 mg by mouth twice a day for 10 days. Recheck in one week if no better or sooner if worse.

## 2013-06-13 NOTE — Progress Notes (Signed)
Fax received from [employer], they shipped pt's [facility] on 11/04 for delivery on 11/05.

## 2013-06-21 ENCOUNTER — Other Ambulatory Visit: Payer: Medicare Other

## 2013-06-26 ENCOUNTER — Other Ambulatory Visit (HOSPITAL_BASED_OUTPATIENT_CLINIC_OR_DEPARTMENT_OTHER): Payer: Medicare Other | Admitting: Lab

## 2013-06-26 ENCOUNTER — Ambulatory Visit: Payer: Medicare Other | Admitting: Hematology and Oncology

## 2013-06-26 ENCOUNTER — Ambulatory Visit (HOSPITAL_BASED_OUTPATIENT_CLINIC_OR_DEPARTMENT_OTHER): Payer: Medicare Other | Admitting: Hematology and Oncology

## 2013-06-26 ENCOUNTER — Other Ambulatory Visit: Payer: Medicare Other | Admitting: Lab

## 2013-06-26 ENCOUNTER — Telehealth: Payer: Self-pay | Admitting: Hematology and Oncology

## 2013-06-26 VITALS — BP 137/63 | HR 80 | Temp 97.0°F | Resp 20 | Ht 69.0 in | Wt 199.8 lb

## 2013-06-26 DIAGNOSIS — I10 Essential (primary) hypertension: Secondary | ICD-10-CM

## 2013-06-26 DIAGNOSIS — C9210 Chronic myeloid leukemia, BCR/ABL-positive, not having achieved remission: Secondary | ICD-10-CM

## 2013-06-26 DIAGNOSIS — D649 Anemia, unspecified: Secondary | ICD-10-CM

## 2013-06-26 LAB — COMPREHENSIVE METABOLIC PANEL (CC13)
ALT: 19 U/L (ref 0–55)
AST: 19 U/L (ref 5–34)
Alkaline Phosphatase: 85 U/L (ref 40–150)
Calcium: 9.1 mg/dL (ref 8.4–10.4)
Chloride: 110 mEq/L — ABNORMAL HIGH (ref 98–109)
Creatinine: 0.9 mg/dL (ref 0.7–1.3)

## 2013-06-26 LAB — CBC WITH DIFFERENTIAL/PLATELET
BASO%: 0.6 % (ref 0.0–2.0)
EOS%: 1.7 % (ref 0.0–7.0)
HCT: 36.3 % — ABNORMAL LOW (ref 38.4–49.9)
HGB: 12.1 g/dL — ABNORMAL LOW (ref 13.0–17.1)
MCH: 31.4 pg (ref 27.2–33.4)
MCHC: 33.2 g/dL (ref 32.0–36.0)
MCV: 94.6 fL (ref 79.3–98.0)
NEUT%: 70.6 % (ref 39.0–75.0)
Platelets: 185 10*3/uL (ref 140–400)
RDW: 15 % — ABNORMAL HIGH (ref 11.0–14.6)
lymph#: 1.1 10*3/uL (ref 0.9–3.3)

## 2013-06-26 NOTE — Telephone Encounter (Signed)
Appts made per 11/19 POF AVS and CAL given shh

## 2013-06-26 NOTE — Progress Notes (Signed)
[facility] OFFICE PROGRESS NOTE  Patient Care Team: [name], MD as PCP - General (Family Medicine) Francisca December, MD as Attending Physician (Cardiology) [name], MD (Family Medicine) [name], MD (Cardiothoracic Surgery) Larina Earthly, MD as Attending Physician (Vascular Surgery) [name], MD as Consulting Physician (Hematology and Oncology)  DIAGNOSIS: CML in chronic phase  SUMMARY OF ONCOLOGIC HISTORY:   CML (chronic myelocytic leukemia)   05/04/2012 Bone Marrow Biopsy BM confirmed diagnosis of CML in Chronic phase. BCR/ABL by PCR detected abnormalitis with b2a2 & b3a2 subtypes   05/09/2012 - 06/06/2013 Chemotherapy He was started on treatment with Dasatinib 100 mg daily   06/06/2012 Adverse Reaction Dose of medication was reduced to 50 mg daily due to pancytopenia   01/24/2013 Progression Patient was noted to have elevated blood count which and was subsequently found to be noncompliant to treatment. The patient has not been on treatment for several months because his prescription ran out. He was restarted back on treatment    INTERVAL HISTORY: Anthony Wolfe 67 y.o. male returns for further followup. He is feeling well. He has been compliant with his treatment. He denies any recent fever, chills, night sweats or abnormal weight loss The patient denies any mouth sores, nausea, vomiting or change in bowel habits The patient is concerned because he will be changing insurance in early January. I have reviewed the past medical history, past surgical history, social history and family history with the patient and they are unchanged from previous note.  ALLERGIES:  is allergic to azithromycin; norvasc; and sulfa antibiotics.  MEDICATIONS:  Current Outpatient Prescriptions  Medication Sig Dispense Refill  . aspirin 325 MG tablet Take 325 mg by mouth daily.      . citalopram (CELEXA) 20 MG tablet Take 20 mg by mouth daily.       .  cyclobenzaprine (FLEXERIL) 10 MG tablet Take 1 tablet (10 mg total) by mouth 3 (three) times daily as needed for muscle spasms.  30 tablet  0  . dasatinib (SPRYCEL) 50 MG tablet Take 1 tablet (50 mg total) by mouth daily.  90 tablet  1  . furosemide (LASIX) 20 MG tablet Take 20 mg by mouth daily.       . simvastatin (ZOCOR) 40 MG tablet TAKE ONE TABLET BY MOUTH AT BEDTIME  30 tablet  5  . verapamil (CALAN-SR) 180 MG CR tablet Take 1 tablet (180 mg total) by mouth at bedtime.  90 tablet  3  . VIAGRA 100 MG tablet TAKE ONE TABLET BY MOUTH EVERY DAY AS NEEDED  10 tablet  5   No current facility-administered medications for this visit.    REVIEW OF SYSTEMS:   Constitutional: Denies fevers, chills or abnormal weight loss Eyes: Denies blurriness of vision Ears, nose, mouth, throat, and face: Denies mucositis or sore throat Respiratory: Denies cough, dyspnea or wheezes Cardiovascular: Denies palpitation, chest discomfort or lower extremity swelling Gastrointestinal:  Denies nausea, heartburn or change in bowel habits Skin: Denies abnormal skin rashes Lymphatics: Denies new lymphadenopathy or easy bruising Neurological:Denies numbness, tingling or new weaknesses Behavioral/Psych: Mood is stable, no new changes  All other systems were reviewed with the patient and are negative.  PHYSICAL EXAMINATION: ECOG PERFORMANCE STATUS: 0 - Asymptomatic  Filed Vitals:   06/26/13 0857  BP: 137/63  Pulse: 80  Temp: 97 F (36.1 C)  Resp: 20   Filed Weights   06/26/13 0857  Weight: 199 lb 12.8 oz (90.629  kg)    GENERAL:alert, no distress and comfortable SKIN: skin color, texture, turgor are normal, no rashes or significant lesions EYES: normal, Conjunctiva are pink and non-injected, sclera clear OROPHARYNX:no exudate, no erythema and lips, buccal mucosa, and tongue normal . Poor dentition is noted NECK: supple, thyroid normal size, non-tender, without nodularity LYMPH:  no palpable lymphadenopathy  in the cervical, axillary or inguinal LUNGS: clear to auscultation and percussion with normal breathing effort HEART: regular rate & rhythm and no murmurs and no lower extremity edema ABDOMEN:abdomen soft, non-tender and normal bowel sounds Musculoskeletal:no cyanosis of digits and no clubbing  NEURO: alert & oriented x 3 with fluent speech, no focal motor/sensory deficits  LABORATORY DATA:  I have reviewed the data as listed    Component Value Date/Time   NA 144 05/28/2013 1443   NA 144 04/04/2013 0900   K 3.8 05/28/2013 1443   K 4.6 04/04/2013 0900   CL 110 04/04/2013 0900   CL 110* 11/14/2012 0918   CO2 24 05/28/2013 1443   CO2 27 04/04/2013 0900   GLUCOSE 102 05/28/2013 1443   GLUCOSE 91 04/04/2013 0900   GLUCOSE 108* 11/14/2012 0918   BUN 12.4 05/28/2013 1443   BUN 11 04/04/2013 0900   CREATININE 0.9 05/28/2013 1443   CREATININE 1.03 04/04/2013 0900   CREATININE 1.12 02/27/2009 0425   CALCIUM 9.0 05/28/2013 1443   CALCIUM 9.1 04/04/2013 0900   PROT 6.9 05/28/2013 1443   PROT 6.5 04/04/2013 0900   ALBUMIN 3.5 05/28/2013 1443   ALBUMIN 4.1 04/04/2013 0900   AST 10 05/28/2013 1443   AST 14 04/04/2013 0900   ALT 7 05/28/2013 1443   ALT 17 04/04/2013 0900   ALKPHOS 88 05/28/2013 1443   ALKPHOS 88 04/04/2013 0900   BILITOT 0.55 05/28/2013 1443   BILITOT 0.8 04/04/2013 0900   GFRNONAA >60 02/27/2009 0425   GFRAA  Value: >60        The eGFR has been calculated using the MDRD equation. This calculation has not been validated in all clinical situations. eGFR's persistently <60 mL/min signify possible Chronic Kidney Disease. 02/27/2009 0425    No results found for this basename: SPEP, UPEP,  kappa and lambda light chains    Lab Results  Component Value Date   WBC 5.8 06/26/2013   NEUTROABS 4.1 06/26/2013   HGB 12.1* 06/26/2013   HCT 36.3* 06/26/2013   MCV 94.6 06/26/2013   PLT 185 06/26/2013      Chemistry      Component Value Date/Time   NA 144 05/28/2013 1443   NA 144 04/04/2013  0900   K 3.8 05/28/2013 1443   K 4.6 04/04/2013 0900   CL 110 04/04/2013 0900   CL 110* 11/14/2012 0918   CO2 24 05/28/2013 1443   CO2 27 04/04/2013 0900   BUN 12.4 05/28/2013 1443   BUN 11 04/04/2013 0900   CREATININE 0.9 05/28/2013 1443   CREATININE 1.03 04/04/2013 0900   CREATININE 1.12 02/27/2009 0425      Component Value Date/Time   CALCIUM 9.0 05/28/2013 1443   CALCIUM 9.1 04/04/2013 0900   ALKPHOS 88 05/28/2013 1443   ALKPHOS 88 04/04/2013 0900   AST 10 05/28/2013 1443   AST 14 04/04/2013 0900   ALT 7 05/28/2013 1443   ALT 17 04/04/2013 0900   BILITOT 0.55 05/28/2013 1443   BILITOT 0.8 04/04/2013 0900     His last BCR/ABL was detectable at 3.5% ASSESSMENT & PLAN:  #1 CML The patient has  not achieved remission at the one-year mark due to missing doses. BCR/ABL is still trending down. It is currently on a small dose because of history of pancytopenia. I am waiting for results from today's PCR. If it is not trending down, we might have to increase the dose of his chemotherapy again. I'm holding off to a bone marrow aspirate and biopsy until results of the PCR is available. #2 hypertension His blood pressure is under better control. His primary care provider has started him on verapamil and he felt that being very helpful #3 financial difficulties I have advised the family to inquire from his future insurance company information regarding prescription refill for the Dasatinib So that we can work on his future medication refills. #4 anemia This is likely due to recent treatment. The patient denies recent history of bleeding such as epistaxis, hematuria or hematochezia. He is asymptomatic from the anemia. I will observe for now.  He does not require transfusion now. I will continue the chemotherapy at current dose without dosage adjustment.  If the anemia gets progressive worse in the future, I might have to delay his treatment or adjust the chemotherapy dose.  As the patient has not  achieved complete remission and that is history of noncompliance, he will continue to see me on a monthly basis for now.  Orders Placed This Encounter  Procedures  . CBC with Differential    Standing Status: Future     Number of Occurrences:      Standing Expiration Date: 03/18/2014  . Comprehensive metabolic panel    Standing Status: Future     Number of Occurrences:      Standing Expiration Date: 06/26/2014  . Bcr/abl gene rearrangement qnt, PCR    Standing Status: Future     Number of Occurrences:      Standing Expiration Date: 06/26/2014   All questions were answered. The patient knows to call the clinic with any problems, questions or concerns. No barriers to learning was detected.    Trey Bebee, MD 06/26/2013 9:18 AM

## 2013-07-11 ENCOUNTER — Encounter: Payer: Self-pay | Admitting: *Deleted

## 2013-07-11 NOTE — Progress Notes (Signed)
Fax received from [employer], they shipped Sp[facility] to pt on 12/03 for delivery today.

## 2013-07-19 ENCOUNTER — Other Ambulatory Visit: Payer: Medicare Other

## 2013-07-22 ENCOUNTER — Other Ambulatory Visit: Payer: Self-pay | Admitting: Family Medicine

## 2013-07-29 ENCOUNTER — Other Ambulatory Visit (HOSPITAL_BASED_OUTPATIENT_CLINIC_OR_DEPARTMENT_OTHER): Payer: Medicare Other

## 2013-07-29 ENCOUNTER — Telehealth: Payer: Self-pay | Admitting: Hematology and Oncology

## 2013-07-29 ENCOUNTER — Ambulatory Visit (HOSPITAL_BASED_OUTPATIENT_CLINIC_OR_DEPARTMENT_OTHER): Payer: Medicare Other | Admitting: Hematology and Oncology

## 2013-07-29 ENCOUNTER — Other Ambulatory Visit: Payer: Self-pay | Admitting: *Deleted

## 2013-07-29 ENCOUNTER — Encounter: Payer: Self-pay | Admitting: Hematology and Oncology

## 2013-07-29 VITALS — BP 156/63 | HR 78 | Temp 98.3°F | Resp 18 | Ht 69.0 in | Wt 198.0 lb

## 2013-07-29 DIAGNOSIS — C9211 Chronic myeloid leukemia, BCR/ABL-positive, in remission: Secondary | ICD-10-CM

## 2013-07-29 DIAGNOSIS — D649 Anemia, unspecified: Secondary | ICD-10-CM

## 2013-07-29 DIAGNOSIS — I10 Essential (primary) hypertension: Secondary | ICD-10-CM

## 2013-07-29 DIAGNOSIS — C9210 Chronic myeloid leukemia, BCR/ABL-positive, not having achieved remission: Secondary | ICD-10-CM

## 2013-07-29 LAB — CBC WITH DIFFERENTIAL/PLATELET
Basophils Absolute: 0 10*3/uL (ref 0.0–0.1)
Eosinophils Absolute: 0.1 10*3/uL (ref 0.0–0.5)
HCT: 38.4 % (ref 38.4–49.9)
HGB: 12.9 g/dL — ABNORMAL LOW (ref 13.0–17.1)
LYMPH%: 20.8 % (ref 14.0–49.0)
MCV: 95.8 fL (ref 79.3–98.0)
MONO#: 0.6 10*3/uL (ref 0.1–0.9)
MONO%: 9 % (ref 0.0–14.0)
NEUT#: 4.2 10*3/uL (ref 1.5–6.5)
NEUT%: 67.8 % (ref 39.0–75.0)
Platelets: 183 10*3/uL (ref 140–400)
WBC: 6.2 10*3/uL (ref 4.0–10.3)
lymph#: 1.3 10*3/uL (ref 0.9–3.3)

## 2013-07-29 LAB — COMPREHENSIVE METABOLIC PANEL (CC13)
Alkaline Phosphatase: 95 U/L (ref 40–150)
Anion Gap: 8 mEq/L (ref 3–11)
BUN: 12.5 mg/dL (ref 7.0–26.0)
CO2: 27 mEq/L (ref 22–29)
Calcium: 9.3 mg/dL (ref 8.4–10.4)
Chloride: 109 mEq/L (ref 98–109)
Glucose: 102 mg/dl (ref 70–140)
Sodium: 144 mEq/L (ref 136–145)
Total Bilirubin: 0.44 mg/dL (ref 0.20–1.20)
Total Protein: 7.3 g/dL (ref 6.4–8.3)

## 2013-07-29 MED ORDER — DASATINIB 50 MG PO TABS: 50.0000 mg | ORAL_TABLET | Freq: Every day | ORAL | Status: DC

## 2013-07-29 NOTE — Progress Notes (Signed)
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 BCR/ABL by PCR detected abnormalitis with b2a2 & b3a2 subtypes   05/09/2012 - 06/06/2013 Chemotherapy He was started on treatment with Dasatinib 100 mg daily   06/06/2012 Adverse Reaction Dose of medication was reduced to 50 mg daily due to pancytopenia   01/24/2013 Progression Patient was noted to have elevated blood count which and was subsequently found to be noncompliant to treatment. The patient has not been on treatment for several months because his prescription ran out. He was restarted back on treatment    INTERVAL HISTORY: Anthony Wolfe 67 y.o. male returns for further followup. The patient has been compliant with his treatment recommendation and did not miss any of these doses. He denies any recent fever, chills, night sweats or abnormal weight loss The patient denies any mouth sores, nausea, vomiting or change in bowel habits No abnormal skin rash. Overall he tolerated the reduced dose chemotherapy very well. I have reviewed the past medical history, past surgical history, social history and family history with the patient and they are unchanged from previous note.  ALLERGIES:  is allergic to azithromycin; norvasc; and sulfa antibiotics.  MEDICATIONS:  Current Outpatient Prescriptions  Medication Sig Dispense Refill  . aspirin 325 MG tablet Take 325 mg by mouth daily.      . dasatinib  (SPRYCEL) 50 MG tablet Take 1 tablet (50 mg total) by mouth daily.  90 tablet  1  . furosemide (LASIX) 20 MG tablet Take 20 mg by mouth daily.       . simvastatin (ZOCOR) 40 MG tablet TAKE ONE TABLET BY MOUTH ONCE DAILY AT BEDTIME  30 tablet  5  . verapamil (CALAN-SR) 180 MG CR tablet Take 1 tablet (180 mg total) by mouth at bedtime.  90 tablet  3  . VIAGRA 100 MG tablet TAKE ONE TABLET BY MOUTH EVERY DAY AS NEEDED  10 tablet  5  . dasatinib (SPRYCEL) 50 MG tablet Take 1 tablet (50 mg total) by mouth daily.  30 tablet  5   No current facility-administered medications for this visit.    REVIEW OF SYSTEMS:   Constitutional: Denies fevers, chills or abnormal weight loss Eyes: Denies blurriness of vision Ears, nose, mouth, throat, and face: Denies mucositis or sore throat Respiratory: Denies cough, dyspnea or wheezes Cardiovascular: Denies palpitation, chest discomfort or lower extremity swelling Gastrointestinal:  Denies nausea, heartburn or change in bowel habits Skin: Denies abnormal skin rashes Lymphatics: Denies new lymphadenopathy or easy bruising Neurological:Denies numbness, tingling or new weaknesses Behavioral/Psych: Mood is stable, no new changes  All other systems were reviewed with the patient and are negative.  PHYSICAL EXAMINATION: ECOG PERFORMANCE STATUS: 0 - Asymptomatic  Filed Vitals:   07/29/13 0855  BP: 156/63  Pulse: 78  Temp: 98.3 F (36.8 C)  Resp: 18   Filed Weights   07/29/13 0855  Weight: 198 lb (89.812 kg)    GENERAL:alert, no distress and comfortable SKIN: skin color, texture, turgor are normal, no rashes  or significant lesions EYES: normal, Conjunctiva are pink and non-injected, sclera clear OROPHARYNX:no exudate, no erythema and lips, buccal mucosa, and tongue normal  NECK: supple, thyroid normal size, non-tender, without nodularity LYMPH:  no palpable lymphadenopathy in the cervical, axillary or inguinal LUNGS: clear to auscultation and  percussion with normal breathing effort HEART: regular rate & rhythm and no murmurs and no lower extremity edema ABDOMEN:abdomen soft, non-tender and normal bowel sounds Musculoskeletal:no cyanosis of digits and no clubbing  NEURO: alert & oriented x 3 with fluent speech, no focal motor/sensory deficits  LABORATORY DATA:  I have reviewed the data as listed    Component Value Date/Time   NA 144 07/29/2013 0839   NA 144 04/04/2013 0900   K 3.8 07/29/2013 0839   K 4.6 04/04/2013 0900   CL 110 04/04/2013 0900   CL 110* 11/14/2012 0918   CO2 27 07/29/2013 0839   CO2 27 04/04/2013 0900   GLUCOSE 102 07/29/2013 0839   GLUCOSE 91 04/04/2013 0900   GLUCOSE 108* 11/14/2012 0918   BUN 12.5 07/29/2013 0839   BUN 11 04/04/2013 0900   CREATININE 0.9 07/29/2013 0839   CREATININE 1.03 04/04/2013 0900   CREATININE 1.12 02/27/2009 0425   CALCIUM 9.3 07/29/2013 0839   CALCIUM 9.1 04/04/2013 0900   PROT 7.3 07/29/2013 0839   PROT 6.5 04/04/2013 0900   ALBUMIN 3.7 07/29/2013 0839   ALBUMIN 4.1 04/04/2013 0900   AST 21 07/29/2013 0839   AST 14 04/04/2013 0900   ALT 25 07/29/2013 0839   ALT 17 04/04/2013 0900   ALKPHOS 95 07/29/2013 0839   ALKPHOS 88 04/04/2013 0900   BILITOT 0.44 07/29/2013 0839   BILITOT 0.8 04/04/2013 0900   GFRNONAA >60 02/27/2009 0425   GFRAA  Value: >60        The eGFR has been calculated using the MDRD equation. This calculation has not been validated in all clinical situations. eGFR's persistently <60 mL/min signify possible Chronic Kidney Disease. 02/27/2009 0425    No results found for this basename: SPEP, UPEP,  kappa and lambda light chains    Lab Results  Component Value Date   WBC 6.2 07/29/2013   NEUTROABS 4.2 07/29/2013   HGB 12.9* 07/29/2013   HCT 38.4 07/29/2013   MCV 95.8 07/29/2013   PLT 183 07/29/2013      Chemistry      Component Value Date/Time   NA 144 07/29/2013 0839   NA 144 04/04/2013 0900   K 3.8 07/29/2013 0839   K 4.6 04/04/2013 0900   CL 110 04/04/2013  0900   CL 110* 11/14/2012 0918   CO2 27 07/29/2013 0839   CO2 27 04/04/2013 0900   BUN 12.5 07/29/2013 0839   BUN 11 04/04/2013 0900   CREATININE 0.9 07/29/2013 0839   CREATININE 1.03 04/04/2013 0900   CREATININE 1.12 02/27/2009 0425      Component Value Date/Time   CALCIUM 9.3 07/29/2013 0839   CALCIUM 9.1 04/04/2013 0900   ALKPHOS 95 07/29/2013 0839   ALKPHOS 88 04/04/2013 0900   AST 21 07/29/2013 0839   AST 14 04/04/2013 0900   ALT 25 07/29/2013 0839   ALT 17 04/04/2013 0900   BILITOT 0.44 07/29/2013 0839   BILITOT 0.8 04/04/2013 0900       ASSESSMENT & PLAN:  #1 CML The patient has not achieved remission at the one-year mark due to missing doses. BCR/ABL is still trending down. It is currently on a small dose because of history of  pancytopenia. I am waiting for results from today's PCR. If it is not trending down, we might have to increase the dose of his chemotherapy again. I'm holding off to a bone marrow aspirate and biopsy until results of the PCR is available. #2 hypertension His blood pressure is under better control. His primary care provider has started him on verapamil and he felt that being very helpful #3 financial difficulties I have advised the family to inquire from his future insurance company information regarding prescription refill for the Dasatinib So that we can work on his future medication refills. #4 anemia This is likely due to recent treatment. The patient denies recent history of bleeding such as epistaxis, hematuria or hematochezia. He is asymptomatic from the anemia. I will observe for now.  He does not require transfusion now. I will continue the chemotherapy at current dose without dosage adjustment.  If the anemia gets progressive worse in the future, I might have to delay his treatment or adjust the chemotherapy dose.  As the patient has not achieved complete remission and that is history of noncompliance, he will continue to see me on a monthly basis  for now.   Orders Placed This Encounter  Procedures  . Comprehensive metabolic panel    Standing Status: Future     Number of Occurrences:      Standing Expiration Date: 07/29/2014  . CBC with Differential    Standing Status: Future     Number of Occurrences:      Standing Expiration Date: 04/20/2014  . BCR-ABL    With RT-PCR technique    Standing Status: Future     Number of Occurrences:      Standing Expiration Date: 07/29/2014   All questions were answered. The patient knows to call the clinic with any problems, questions or concerns. No barriers to learning was detected. I spent 25 minutes counseling the patient face to face. The total time spent in the appointment was 40 minutes and more than 50% was on counseling and review of test results     [facility] Wolfe, Demeka Sutter, MD 07/29/2013 9:40 AM

## 2013-07-29 NOTE — Telephone Encounter (Signed)
gv pt appt schedule for january °

## 2013-08-12 ENCOUNTER — Encounter: Payer: Self-pay | Admitting: Family Medicine

## 2013-08-12 ENCOUNTER — Other Ambulatory Visit: Payer: Self-pay | Admitting: Family Medicine

## 2013-08-12 ENCOUNTER — Ambulatory Visit (INDEPENDENT_AMBULATORY_CARE_PROVIDER_SITE_OTHER): Payer: Medicare HMO | Admitting: Family Medicine

## 2013-08-12 VITALS — BP 120/78 | HR 84 | Temp 98.3°F | Resp 18 | Wt 195.0 lb

## 2013-08-12 DIAGNOSIS — E785 Hyperlipidemia, unspecified: Secondary | ICD-10-CM

## 2013-08-12 DIAGNOSIS — J209 Acute bronchitis, unspecified: Secondary | ICD-10-CM

## 2013-08-12 MED ORDER — AZITHROMYCIN 250 MG PO TABS: ORAL_TABLET | ORAL | Status: DC

## 2013-08-12 NOTE — Progress Notes (Signed)
Subjective:    Patient ID: Anthony Wolfe, male    DOB: 1946-01-05, 68 y.o.   MRN: [id number]  HPI Patient is a very pleasant 68 year old white male who has a history of coronary artery disease, bilateral carotid artery disease, CML in remission, TIAs, PVCs, and hyperlipidemia.  One week ago he developed acute onset of fevers chills and myalgias. He subsequently developed a worsening cough. He now complains of significant congestion in his chest. He denies any shortness of breath. He denies any hemoptysis. His cough is productive of yellow-white sputum  he is also here today to have his lipids checked as well as blood pressure. His blood pressures well controlled 120/78. He denies any chest pain or shortness of breath. He denies any myalgias on statin. He is also due for Prevnar 13. Past Medical History  Diagnosis Date  . Lymphocytosis   . Hyperlipidemia   . HTN (hypertension)   . Allergic rhinitis   . Prostate hypertrophy   . Anxiety   . CAD (coronary artery disease)     ASCVD, borderline left main and single-vessel Occluded RCA, 40% left main, catheterization 11/2002 [name])  . TIA (transient ischemic attack)     ASPVD, S./P. right CEA, 2002, and known occluded left carotid  . 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 Ischemic cardiomyopathy     EF 36% now resolved  . Carotid disease, bilateral     right CEA, known occluded left carotid  . Depression   . Arrhythmia     PVC's with Holter showing PVC load of 22%  . Muscle spasm 05/28/2013  . CML in remission 07/29/2013   Current Outpatient Prescriptions on File Prior to Visit  Medication Sig Dispense Refill  . aspirin 325 MG tablet Take 325 mg by mouth daily.      . dasatinib (SPRYCEL) 50 MG tablet Take 1 tablet (50 mg total) by mouth daily.  30 tablet  5  . furosemide (LASIX) 20 MG tablet Take 20 mg by mouth daily.       . simvastatin (ZOCOR) 40 MG tablet TAKE ONE TABLET BY MOUTH ONCE DAILY AT BEDTIME  30 tablet  5  .  verapamil (CALAN-SR) 180 MG CR tablet Take 1 tablet (180 mg total) by mouth at bedtime.  90 tablet  3  . VIAGRA 100 MG tablet TAKE ONE TABLET BY MOUTH EVERY DAY AS NEEDED  10 tablet  5   No current facility-administered medications on file prior to visit.   Allergies  Allergen Reactions  . Azithromycin     rash  . Norvasc [Amlodipine Besylate]     rash  . Sulfa Antibiotics Hives   History   Social History  . Marital Status: Married    Spouse Name: N/A    Number of Children: 2  . Years of Education: N/A   Occupational History  .      retired Development worker, [employer]   Social History Main Topics  . Smoking status: Former Smoker    Quit date: 08/08/1997  . Smokeless tobacco: Former Systems developer  . Alcohol Use: No  . Drug Use: No  . Sexual Activity: Not on file   Other Topics Concern  . Not on file   Social History Narrative  . No narrative on file      Review of Systems  All other systems reviewed and are negative.       Objective:   Physical Exam  Vitals reviewed. Neck: Neck supple. No JVD present.  Cardiovascular: Normal  rate, regular rhythm and normal heart sounds.  Exam reveals no gallop and no friction rub.   No murmur heard. Pulmonary/Chest: Effort normal. He has wheezes. He has rales.  Abdominal: Soft. Bowel sounds are normal. He exhibits no distension and no mass. There is no tenderness. There is no rebound and no guarding.  Musculoskeletal: He exhibits no edema.  Lymphadenopathy:    He has no cervical adenopathy.   Patient has faint rales in his left lower lobe. He also has scattered expiratory wheezes.       Assessment & Plan:  1. Acute bronchitis I believe the patient had viral influenza. However I believe he may be developing a secondary pneumonia/bacterial bronchitis. I saw the patient on a Z-Pak. He has a documented allergy to azithromycin but this was a rash. I question if it was a true allergy and I believe a Z-Pak to be the best option for an acute bacterial  bronchitis/walking pneumonia. - azithromycin (ZITHROMAX) 250 MG tablet; 2 tabs poqday 1, 1 tab poqday 2-5  Dispense: 6 tablet; Refill: 0  2. HLD (hyperlipidemia) Check a fasting lipid panel. Patient LDL goal is less than 70. His blood pressures well controlled today. Also recommended the patient return when he feels better for Prevnar 13. - COMPLETE METABOLIC PANEL WITH GFR - Lipid panel - CBC with Differential

## 2013-08-13 LAB — COMPLETE METABOLIC PANEL WITH GFR
ALT: 21 U/L (ref 0–53)
AST: 20 U/L (ref 0–37)
Albumin: 4 g/dL (ref 3.5–5.2)
Alkaline Phosphatase: 88 U/L (ref 39–117)
BUN: 15 mg/dL (ref 6–23)
CALCIUM: 8.6 mg/dL (ref 8.4–10.5)
CHLORIDE: 106 meq/L (ref 96–112)
CO2: 25 mEq/L (ref 19–32)
Creat: 0.91 mg/dL (ref 0.50–1.35)
GFR, Est African American: >89 mL/min
GFR, Est Non African American: 86 mL/min
Glucose, Bld: 131 mg/dL — ABNORMAL HIGH (ref 70–99)
Potassium: 3.6 mEq/L (ref 3.5–5.3)
Sodium: 140 mEq/L (ref 135–145)
Total Bilirubin: 0.5 mg/dL (ref 0.3–1.2)
Total Protein: 6.9 g/dL (ref 6.0–8.3)

## 2013-08-13 LAB — CBC WITH DIFFERENTIAL/PLATELET
BASOS PCT: 1 % (ref 0–1)
Basophils Absolute: 0 10*3/uL (ref 0.0–0.1)
EOS ABS: 0.1 10*3/uL (ref 0.0–0.7)
Eosinophils Relative: 2 % (ref 0–5)
HEMATOCRIT: 38.7 % — AB (ref 39.0–52.0)
HEMOGLOBIN: 13.3 g/dL (ref 13.0–17.0)
Lymphocytes Relative: 34 % (ref 12–46)
Lymphs Abs: 1.4 10*3/uL (ref 0.7–4.0)
MCH: 31.6 pg (ref 26.0–34.0)
MCHC: 34.4 g/dL (ref 30.0–36.0)
MCV: 91.9 fL (ref 78.0–100.0)
MONO ABS: 0.5 10*3/uL (ref 0.1–1.0)
Monocytes Relative: 12 % (ref 3–12)
NEUTROS PCT: 51 % (ref 43–77)
Neutro Abs: 2.2 10*3/uL (ref 1.7–7.7)
Platelets: 172 10*3/uL (ref 150–400)
RBC: 4.21 MIL/uL — ABNORMAL LOW (ref 4.22–5.81)
RDW: 14.7 % (ref 11.5–15.5)
WBC: 4.1 10*3/uL (ref 4.0–10.5)

## 2013-08-13 LAB — LIPID PANEL
Cholesterol: 117 mg/dL (ref 0–200)
HDL: 27 mg/dL — ABNORMAL LOW (ref >39)
LDL Cholesterol: 67 mg/dL (ref 0–99)
Total CHOL/HDL Ratio: 4.3 Ratio
Triglycerides: 114 mg/dL (ref <150)
VLDL: 23 mg/dL (ref 0–40)

## 2013-08-14 LAB — HEMOGLOBIN A1C
Hgb A1c MFr Bld: 5.5 % (ref <5.7)
Mean Plasma Glucose: 111 mg/dL (ref <117)

## 2013-08-16 ENCOUNTER — Other Ambulatory Visit: Payer: Self-pay | Admitting: Family Medicine

## 2013-08-16 ENCOUNTER — Other Ambulatory Visit: Payer: Medicare Other

## 2013-08-16 DIAGNOSIS — I10 Essential (primary) hypertension: Secondary | ICD-10-CM

## 2013-08-16 MED ORDER — SIMVASTATIN 40 MG PO TABS: ORAL_TABLET | ORAL | Status: DC

## 2013-08-16 MED ORDER — VERAPAMIL HCL ER 180 MG PO TBCR: 180.0000 mg | EXTENDED_RELEASE_TABLET | Freq: Every day | ORAL | Status: DC

## 2013-08-28 ENCOUNTER — Other Ambulatory Visit (HOSPITAL_BASED_OUTPATIENT_CLINIC_OR_DEPARTMENT_OTHER): Payer: Medicare Other

## 2013-08-28 ENCOUNTER — Ambulatory Visit (HOSPITAL_BASED_OUTPATIENT_CLINIC_OR_DEPARTMENT_OTHER): Payer: Medicare HMO | Admitting: Hematology and Oncology

## 2013-08-28 ENCOUNTER — Other Ambulatory Visit: Payer: Self-pay | Admitting: *Deleted

## 2013-08-28 ENCOUNTER — Telehealth: Payer: Self-pay | Admitting: Hematology and Oncology

## 2013-08-28 VITALS — BP 134/70 | HR 76 | Temp 98.1°F | Resp 18 | Ht 69.0 in | Wt 199.7 lb

## 2013-08-28 DIAGNOSIS — D649 Anemia, unspecified: Secondary | ICD-10-CM

## 2013-08-28 DIAGNOSIS — C9211 Chronic myeloid leukemia, BCR/ABL-positive, in remission: Secondary | ICD-10-CM

## 2013-08-28 DIAGNOSIS — C9210 Chronic myeloid leukemia, BCR/ABL-positive, not having achieved remission: Secondary | ICD-10-CM

## 2013-08-28 LAB — COMPREHENSIVE METABOLIC PANEL (CC13)
ALT: 17 U/L (ref 0–55)
ANION GAP: 10 meq/L (ref 3–11)
AST: 19 U/L (ref 5–34)
Albumin: 3.5 g/dL (ref 3.5–5.0)
Alkaline Phosphatase: 82 U/L (ref 40–150)
BUN: 16.3 mg/dL (ref 7.0–26.0)
CALCIUM: 9.3 mg/dL (ref 8.4–10.4)
CHLORIDE: 111 meq/L — AB (ref 98–109)
CO2: 24 meq/L (ref 22–29)
CREATININE: 0.9 mg/dL (ref 0.7–1.3)
GLUCOSE: 72 mg/dL (ref 70–140)
Potassium: 3.7 mEq/L (ref 3.5–5.1)
Sodium: 146 mEq/L — ABNORMAL HIGH (ref 136–145)
Total Bilirubin: 0.36 mg/dL (ref 0.20–1.20)
Total Protein: 6.8 g/dL (ref 6.4–8.3)

## 2013-08-28 LAB — CBC WITH DIFFERENTIAL/PLATELET
BASO%: 0.4 % (ref 0.0–2.0)
BASOS ABS: 0 10*3/uL (ref 0.0–0.1)
EOS ABS: 0.1 10*3/uL (ref 0.0–0.5)
EOS%: 1.4 % (ref 0.0–7.0)
HCT: 35.9 % — ABNORMAL LOW (ref 38.4–49.9)
HGB: 11.8 g/dL — ABNORMAL LOW (ref 13.0–17.1)
LYMPH#: 2.1 10*3/uL (ref 0.9–3.3)
LYMPH%: 27.4 % (ref 14.0–49.0)
MCH: 31.6 pg (ref 27.2–33.4)
MCHC: 32.9 g/dL (ref 32.0–36.0)
MCV: 96 fL (ref 79.3–98.0)
MONO#: 1 10*3/uL — ABNORMAL HIGH (ref 0.1–0.9)
MONO%: 12.3 % (ref 0.0–14.0)
NEUT%: 58.5 % (ref 39.0–75.0)
NEUTROS ABS: 4.5 10*3/uL (ref 1.5–6.5)
Platelets: 201 10*3/uL (ref 140–400)
RBC: 3.74 10*6/uL — ABNORMAL LOW (ref 4.20–5.82)
RDW: 14.2 % (ref 11.0–14.6)
WBC: 7.7 10*3/uL (ref 4.0–10.3)

## 2013-08-28 MED ORDER — DASATINIB 50 MG PO TABS: 50.0000 mg | ORAL_TABLET | Freq: Every day | ORAL | Status: DC

## 2013-08-28 NOTE — Progress Notes (Signed)
[place] OFFICE PROGRESS NOTE  Patient Care Team: [name], MD as PCP - General (Family Medicine) [name], MD as Attending Physician (Cardiology) [name], MD (Family Medicine) [name], MD (Cardiothoracic Surgery) [name], MD as Attending Physician (Vascular Surgery) [name], MD as Consulting Physician (Hematology and Oncology)  DIAGNOSIS: CML in chronic phase  SUMMARY OF ONCOLOGIC HISTORY:   CML (chronic myelocytic leukemia)   05/04/2012 Bone Marrow Biopsy BM confirmed diagnosis of CML in Chronic phase. BCR/ABL by PCR detected abnormalitis with b2a2 & b3a2 subtypes   05/09/2012 - 06/06/2013 Chemotherapy He was started on treatment with Dasatinib 100 mg daily   06/06/2012 Adverse Reaction Dose of medication was reduced to 50 mg daily due to pancytopenia   01/24/2013 Progression Patient was noted to have elevated blood count which and was subsequently found to be noncompliant to treatment. The patient has not been on treatment for several months because his prescription ran out. He was restarted back on treatment    INTERVAL HISTORY: Anthony Wolfe 68 y.o. male returns for further followup. Since I saw him he's been very compliant with treatment. He denies any side effects. Denies any recent infection. He denies any recent fever, chills, night sweats or abnormal weight loss Denies any skin rashes  I have reviewed the past medical history, past surgical history, social history and family history with the patient and they are unchanged from previous note.  ALLERGIES:  is allergic to norvasc and sulfa antibiotics.  MEDICATIONS:  Current Outpatient Prescriptions  Medication Sig Dispense Refill  . aspirin 325 MG tablet Take 325 mg by mouth daily.      . dasatinib (SPRYCEL) 50 MG tablet Take 1 tablet (50 mg total) by mouth daily.  30 tablet  5  . simvastatin (ZOCOR) 40 MG tablet TAKE ONE TABLET BY MOUTH ONCE DAILY AT BEDTIME  90  tablet  4  . verapamil (CALAN-SR) 180 MG CR tablet Take 1 tablet (180 mg total) by mouth at bedtime.  90 tablet  4  . VIAGRA 100 MG tablet TAKE ONE TABLET BY MOUTH EVERY DAY AS NEEDED  10 tablet  5   No current facility-administered medications for this visit.    REVIEW OF SYSTEMS:   Constitutional: Denies fevers, chills or abnormal weight loss Eyes: Denies blurriness of vision Ears, nose, mouth, throat, and face: Denies mucositis or sore throat Respiratory: Denies cough, dyspnea or wheezes Cardiovascular: Denies palpitation, chest discomfort or lower extremity swelling Gastrointestinal:  Denies nausea, heartburn or change in bowel habits Skin: Denies abnormal skin rashes Lymphatics: Denies new lymphadenopathy or easy bruising Neurological:Denies numbness, tingling or new weaknesses Behavioral/Psych: Mood is stable, no new changes  All other systems were reviewed with the patient and are negative.  PHYSICAL EXAMINATION: ECOG PERFORMANCE STATUS: 0 - Asymptomatic  Filed Vitals:   08/28/13 0810  BP: 134/70  Pulse: 76  Temp: 98.1 F (36.7 C)  Resp: 18   Filed Weights   08/28/13 0810  Weight: 199 lb 11.2 oz (90.583 kg)    GENERAL:alert, no distress and comfortable SKIN: skin color, texture, turgor are normal, no rashes or significant lesions EYES: normal, Conjunctiva are pink and non-injected, sclera clear OROPHARYNX:no exudate, no erythema and lips, buccal mucosa, and tongue normal  NECK: supple, thyroid normal size, non-tender, without nodularity LYMPH:  no palpable lymphadenopathy in the cervical, axillary or inguinal LUNGS: clear to auscultation and percussion with normal breathing effort HEART: regular rate & rhythm and  no murmurs and no lower extremity edema ABDOMEN:abdomen soft, non-tender and normal bowel sounds Musculoskeletal:no cyanosis of digits and no clubbing  NEURO: alert & oriented x 3 with fluent speech, no focal motor/sensory deficits  LABORATORY DATA:  I  have reviewed the data as listed    Component Value Date/Time   NA 146* 08/28/2013 0754   NA 140 08/12/2013 1533   K 3.7 08/28/2013 0754   K 3.6 08/12/2013 1533   CL 106 08/12/2013 1533   CL 110* 11/14/2012 0918   CO2 24 08/28/2013 0754   CO2 25 08/12/2013 1533   GLUCOSE 72 08/28/2013 0754   GLUCOSE 131* 08/12/2013 1533   GLUCOSE 108* 11/14/2012 0918   BUN 16.3 08/28/2013 0754   BUN 15 08/12/2013 1533   CREATININE 0.9 08/28/2013 0754   CREATININE 0.91 08/12/2013 1533   CREATININE 1.12 02/27/2009 0425   CALCIUM 9.3 08/28/2013 0754   CALCIUM 8.6 08/12/2013 1533   PROT 6.8 08/28/2013 0754   PROT 6.9 08/12/2013 1533   ALBUMIN 3.5 08/28/2013 0754   ALBUMIN 4.0 08/12/2013 1533   AST 19 08/28/2013 0754   AST 20 08/12/2013 1533   ALT 17 08/28/2013 0754   ALT 21 08/12/2013 1533   ALKPHOS 82 08/28/2013 0754   ALKPHOS 88 08/12/2013 1533   BILITOT 0.36 08/28/2013 0754   BILITOT 0.5 08/12/2013 1533   GFRNONAA >60 02/27/2009 0425   GFRAA  Value: >60        The eGFR has been calculated using the MDRD equation. This calculation has not been validated in all clinical situations. eGFR's persistently <60 mL/min signify possible Chronic Kidney Disease. 02/27/2009 0425    No results found for this basename: SPEP,  UPEP,   kappa and lambda light chains    Lab Results  Component Value Date   WBC 7.7 08/28/2013   NEUTROABS 4.5 08/28/2013   HGB 11.8* 08/28/2013   HCT 35.9* 08/28/2013   MCV 96.0 08/28/2013   PLT 201 08/28/2013      Chemistry      Component Value Date/Time   NA 146* 08/28/2013 0754   NA 140 08/12/2013 1533   K 3.7 08/28/2013 0754   K 3.6 08/12/2013 1533   CL 106 08/12/2013 1533   CL 110* 11/14/2012 0918   CO2 24 08/28/2013 0754   CO2 25 08/12/2013 1533   BUN 16.3 08/28/2013 0754   BUN 15 08/12/2013 1533   CREATININE 0.9 08/28/2013 0754   CREATININE 0.91 08/12/2013 1533   CREATININE 1.12 02/27/2009 0425      Component Value Date/Time   CALCIUM 9.3 08/28/2013 0754   CALCIUM 8.6 08/12/2013 1533   ALKPHOS 82 08/28/2013 0754   ALKPHOS 88  08/12/2013 1533   AST 19 08/28/2013 0754   AST 20 08/12/2013 1533   ALT 17 08/28/2013 0754   ALT 21 08/12/2013 1533   BILITOT 0.36 08/28/2013 0754   BILITOT 0.5 08/12/2013 1533      ASSESSMENT & PLAN:  #1 CML The patient has not achieved remission at the one-year mark due to missing doses. BCR/ABL is still trending down. He is currently on a small dose because of history of pancytopenia. I am waiting for results from today's PCR. If it is not trending down, we might have to increase the dose of his chemotherapy again. I'm holding off to a bone marrow aspirate and biopsy until results of the PCR is available. #2 anemia This is likely due to recent treatment. The patient denies recent history of  bleeding such as epistaxis, hematuria or hematochezia. He is asymptomatic from the anemia. I will observe for now.  He does not require transfusion now. I will continue the chemotherapy at current dose without dosage adjustment.  If the anemia gets progressive worse in the future, I might have to delay his treatment or adjust the chemotherapy dose.  As the patient has not achieved complete remission and that is history of noncompliance, he will continue to see me on a monthly basis for now. Once he achieves complete remission, I will switch his followup to every 3 months.   Orders Placed This Encounter  Procedures  . Comprehensive metabolic panel    Standing Status: Future     Number of Occurrences:      Standing Expiration Date: 08/28/2014  . CBC with Differential    Standing Status: Future     Number of Occurrences:      Standing Expiration Date: 08/28/2014  . BCR-ABL    With RT-PCR technique    Standing Status: Future     Number of Occurrences:      Standing Expiration Date: 08/28/2014   All questions were answered. The patient knows to call the clinic with any problems, questions or concerns. No barriers to learning was detected. I spent 15 minutes counseling the patient face to face. The total  time spent in the appointment was 20 minutes and more than 50% was on counseling and review of test results     [facility], [place], MD 08/28/2013 10:59 AM

## 2013-08-28 NOTE — Telephone Encounter (Signed)
gv and pritned appt sched and avs forpt for Feb °

## 2013-09-17 ENCOUNTER — Encounter: Payer: Self-pay | Admitting: Family Medicine

## 2013-09-19 ENCOUNTER — Telehealth: Payer: Self-pay | Admitting: Family Medicine

## 2013-09-19 MED ORDER — SILDENAFIL CITRATE 100 MG PO TABS: ORAL_TABLET | ORAL | Status: DC

## 2013-09-19 NOTE — Telephone Encounter (Signed)
Med sent to pharm as request by pt.

## 2013-09-19 NOTE — Telephone Encounter (Signed)
Message copied by Alyson Locket on Thu Sep 19, 2013  2:57 PM ------      Message from: Lenore Manner      Created: Wed Sep 18, 2013  4:35 PM      Regarding: Pharmacy        [facility] is calling to request refill for Viagra for this pt please call or fax the prescription please use the order # as a reference      Fax [phone]      Phone [phone]      Order  # [id number] ------

## 2013-09-25 ENCOUNTER — Other Ambulatory Visit (HOSPITAL_BASED_OUTPATIENT_CLINIC_OR_DEPARTMENT_OTHER): Payer: Medicare HMO

## 2013-09-25 ENCOUNTER — Ambulatory Visit (HOSPITAL_BASED_OUTPATIENT_CLINIC_OR_DEPARTMENT_OTHER): Payer: Commercial Managed Care - HMO | Admitting: Hematology and Oncology

## 2013-09-25 ENCOUNTER — Encounter: Payer: Self-pay | Admitting: Hematology and Oncology

## 2013-09-25 ENCOUNTER — Telehealth: Payer: Self-pay | Admitting: Hematology and Oncology

## 2013-09-25 VITALS — BP 136/65 | HR 76 | Temp 98.1°F | Resp 18 | Ht 69.0 in | Wt 201.6 lb

## 2013-09-25 DIAGNOSIS — T451X5A Adverse effect of antineoplastic and immunosuppressive drugs, initial encounter: Secondary | ICD-10-CM

## 2013-09-25 DIAGNOSIS — C9211 Chronic myeloid leukemia, BCR/ABL-positive, in remission: Secondary | ICD-10-CM

## 2013-09-25 DIAGNOSIS — C9210 Chronic myeloid leukemia, BCR/ABL-positive, not having achieved remission: Secondary | ICD-10-CM

## 2013-09-25 DIAGNOSIS — D649 Anemia, unspecified: Secondary | ICD-10-CM

## 2013-09-25 DIAGNOSIS — D630 Anemia in neoplastic disease: Secondary | ICD-10-CM

## 2013-09-25 DIAGNOSIS — D61810 Antineoplastic chemotherapy induced pancytopenia: Secondary | ICD-10-CM | POA: Insufficient documentation

## 2013-09-25 DIAGNOSIS — D61818 Other pancytopenia: Secondary | ICD-10-CM

## 2013-09-25 HISTORY — DX: Anemia in neoplastic disease: D63.0

## 2013-09-25 LAB — CBC WITH DIFFERENTIAL/PLATELET
BASO%: 0.5 % (ref 0.0–2.0)
Basophils Absolute: 0 10*3/uL (ref 0.0–0.1)
EOS ABS: 0.1 10*3/uL (ref 0.0–0.5)
EOS%: 2.1 % (ref 0.0–7.0)
HCT: 36.7 % — ABNORMAL LOW (ref 38.4–49.9)
HGB: 12.5 g/dL — ABNORMAL LOW (ref 13.0–17.1)
LYMPH#: 1.7 10*3/uL (ref 0.9–3.3)
LYMPH%: 28.5 % (ref 14.0–49.0)
MCH: 33 pg (ref 27.2–33.4)
MCHC: 34.2 g/dL (ref 32.0–36.0)
MCV: 96.6 fL (ref 79.3–98.0)
MONO#: 0.5 10*3/uL (ref 0.1–0.9)
MONO%: 9.1 % (ref 0.0–14.0)
NEUT#: 3.5 10*3/uL (ref 1.5–6.5)
NEUT%: 59.8 % (ref 39.0–75.0)
Platelets: 171 10*3/uL (ref 140–400)
RBC: 3.79 10*6/uL — AB (ref 4.20–5.82)
RDW: 14.4 % (ref 11.0–14.6)
WBC: 5.8 10*3/uL (ref 4.0–10.3)

## 2013-09-25 LAB — COMPREHENSIVE METABOLIC PANEL (CC13)
ALBUMIN: 3.8 g/dL (ref 3.5–5.0)
ALK PHOS: 76 U/L (ref 40–150)
ALT: 17 U/L (ref 0–55)
ANION GAP: 10 meq/L (ref 3–11)
AST: 19 U/L (ref 5–34)
BUN: 17.2 mg/dL (ref 7.0–26.0)
CO2: 24 mEq/L (ref 22–29)
Calcium: 9.2 mg/dL (ref 8.4–10.4)
Chloride: 112 mEq/L — ABNORMAL HIGH (ref 98–109)
Creatinine: 0.9 mg/dL (ref 0.7–1.3)
GLUCOSE: 114 mg/dL (ref 70–140)
POTASSIUM: 3.5 meq/L (ref 3.5–5.1)
Sodium: 146 mEq/L — ABNORMAL HIGH (ref 136–145)
TOTAL PROTEIN: 6.7 g/dL (ref 6.4–8.3)
Total Bilirubin: 0.65 mg/dL (ref 0.20–1.20)

## 2013-09-25 NOTE — Progress Notes (Signed)
[place] OFFICE PROGRESS NOTE  Patient Care Team: [name], MD as PCP - General (Family Medicine) [name], MD as Attending Physician (Cardiology) [name], MD (Family Medicine) [name], MD (Cardiothoracic Surgery) [name], MD as Attending Physician (Vascular Surgery) [name], MD as Consulting Physician (Hematology and Oncology)  DIAGNOSIS: CML, in remission  SUMMARY OF ONCOLOGIC HISTORY:   CML (chronic myelocytic leukemia)   05/04/2012 Bone Marrow Biopsy BM confirmed diagnosis of CML in Chronic phase. BCR/ABL by PCR detected abnormalitis with b2a2 & b3a2 subtypes   05/09/2012 - 06/06/2013 Chemotherapy He was started on treatment with Dasatinib 100 mg daily   06/06/2012 Adverse Reaction Dose of medication was reduced to 50 mg daily due to pancytopenia   01/24/2013 Progression Patient was noted to have elevated blood count which and was subsequently found to be noncompliant to treatment. The patient has not been on treatment for several months because his prescription ran out. He was restarted back on treatment    INTERVAL HISTORY: Anthony Wolfe 68 y.o. male returns for further followup. The patient has been compliant with treatment and did not missed any doses. He denies any side effects such as infection, fatigue, skin rashes, shortness of breath or diarrhea. He denies any recent fever, chills, night sweats or abnormal weight loss    I have reviewed the past medical history, past surgical history, social history and family history with the patient and they are unchanged from previous note.  ALLERGIES:  is allergic to norvasc and sulfa antibiotics.  MEDICATIONS:  Current Outpatient Prescriptions  Medication Sig Dispense Refill  . aspirin 325 MG tablet Take 325 mg by mouth daily.      . dasatinib (SPRYCEL) 50 MG tablet Take 1 tablet (50 mg total) by mouth daily.  30 tablet  5  . sildenafil (VIAGRA) 100 MG tablet TAKE ONE  TABLET BY MOUTH EVERY DAY AS NEEDED  6 tablet  11  . simvastatin (ZOCOR) 40 MG tablet TAKE ONE TABLET BY MOUTH ONCE DAILY AT BEDTIME  90 tablet  4  . verapamil (CALAN-SR) 180 MG CR tablet Take 1 tablet (180 mg total) by mouth at bedtime.  90 tablet  4   No current facility-administered medications for this visit.    REVIEW OF SYSTEMS:   Constitutional: Denies fevers, chills or abnormal weight loss Eyes: Denies blurriness of vision Ears, nose, mouth, throat, and face: Denies mucositis or sore throat Respiratory: Denies cough, dyspnea or wheezes Cardiovascular: Denies palpitation, chest discomfort or lower extremity swelling Gastrointestinal:  Denies nausea, heartburn or change in bowel habits Skin: Denies abnormal skin rashes Lymphatics: Denies new lymphadenopathy or easy bruising Neurological:Denies numbness, tingling or new weaknesses Behavioral/Psych: Mood is stable, no new changes  All other systems were reviewed with the patient and are negative.  PHYSICAL EXAMINATION: ECOG PERFORMANCE STATUS: 0 - Asymptomatic  Filed Vitals:   09/25/13 0829  BP: 136/65  Pulse: 76  Temp: 98.1 F (36.7 C)  Resp: 18   Filed Weights   09/25/13 0829  Weight: 201 lb 9.6 oz (91.445 kg)    GENERAL:alert, no distress and comfortable SKIN: skin color, texture, turgor are normal, no rashes or significant lesions EYES: normal, Conjunctiva are pink and non-injected, sclera clear Musculoskeletal:no cyanosis of digits and no clubbing  NEURO: alert & oriented x 3 with fluent speech, no focal motor/sensory deficits  LABORATORY DATA:  I have reviewed the data as listed    Component Value Date/Time  NA 146* 09/25/2013 0757   NA 140 08/12/2013 1533   K 3.5 09/25/2013 0757   K 3.6 08/12/2013 1533   CL 106 08/12/2013 1533   CL 110* 11/14/2012 0918   CO2 24 09/25/2013 0757   CO2 25 08/12/2013 1533   GLUCOSE 114 09/25/2013 0757   GLUCOSE 131* 08/12/2013 1533   GLUCOSE 108* 11/14/2012 0918   BUN 17.2 09/25/2013  0757   BUN 15 08/12/2013 1533   CREATININE 0.9 09/25/2013 0757   CREATININE 0.91 08/12/2013 1533   CREATININE 1.12 02/27/2009 0425   CALCIUM 9.2 09/25/2013 0757   CALCIUM 8.6 08/12/2013 1533   PROT 6.7 09/25/2013 0757   PROT 6.9 08/12/2013 1533   ALBUMIN 3.8 09/25/2013 0757   ALBUMIN 4.0 08/12/2013 1533   AST 19 09/25/2013 0757   AST 20 08/12/2013 1533   ALT 17 09/25/2013 0757   ALT 21 08/12/2013 1533   ALKPHOS 76 09/25/2013 0757   ALKPHOS 88 08/12/2013 1533   BILITOT 0.65 09/25/2013 0757   BILITOT 0.5 08/12/2013 1533   GFRNONAA >60 02/27/2009 0425   GFRAA  Value: >60        The eGFR has been calculated using the MDRD equation. This calculation has not been validated in all clinical situations. eGFR's persistently <60 mL/min signify possible Chronic Kidney Disease. 02/27/2009 0425    No results found for this basename: SPEP,  UPEP,   kappa and lambda light chains    Lab Results  Component Value Date   WBC 5.8 09/25/2013   NEUTROABS 3.5 09/25/2013   HGB 12.5* 09/25/2013   HCT 36.7* 09/25/2013   MCV 96.6 09/25/2013   PLT 171 09/25/2013      Chemistry      Component Value Date/Time   NA 146* 09/25/2013 0757   NA 140 08/12/2013 1533   K 3.5 09/25/2013 0757   K 3.6 08/12/2013 1533   CL 106 08/12/2013 1533   CL 110* 11/14/2012 0918   CO2 24 09/25/2013 0757   CO2 25 08/12/2013 1533   BUN 17.2 09/25/2013 0757   BUN 15 08/12/2013 1533   CREATININE 0.9 09/25/2013 0757   CREATININE 0.91 08/12/2013 1533   CREATININE 1.12 02/27/2009 0425      Component Value Date/Time   CALCIUM 9.2 09/25/2013 0757   CALCIUM 8.6 08/12/2013 1533   ALKPHOS 76 09/25/2013 0757   ALKPHOS 88 08/12/2013 1533   AST 19 09/25/2013 0757   AST 20 08/12/2013 1533   ALT 17 09/25/2013 0757   ALT 21 08/12/2013 1533   BILITOT 0.65 09/25/2013 0757   BILITOT 0.5 08/12/2013 1533     ASSESSMENT & PLAN:  #1 CML The patient has not achieved remission at the one-year mark due to missing doses. BCR/ABL was still trending down recently but the last one from January was  trending up again He is currently on a small dose because of history of pancytopenia. I am waiting for results from today's PCR. If it is not trending down, we might have to increase the dose of his chemotherapy again or to consider switching him to another treatment. I'm holding off to a bone marrow aspirate and biopsy until results of the PCR is available. #2 anemia This is likely due to recent treatment. The patient denies recent history of bleeding such as epistaxis, hematuria or hematochezia. He is asymptomatic from the anemia. I will observe for now.  He does not require transfusion now. I will continue the chemotherapy at current dose without dosage adjustment.  If the anemia gets progressive worse in the future, I might have to delay his treatment or adjust the chemotherapy dose.  Orders Placed This Encounter  Procedures  . CBC with Differential    Standing Status: Future     Number of Occurrences:      Standing Expiration Date: 09/25/2014  . Basic metabolic panel    Standing Status: Future     Number of Occurrences:      Standing Expiration Date: 09/25/2014  . Lactate dehydrogenase    Standing Status: Future     Number of Occurrences:      Standing Expiration Date: 09/25/2014  . BCR-ABL    With RT-PCR technique    Standing Status: Future     Number of Occurrences:      Standing Expiration Date: 09/25/2014   All questions were answered. The patient knows to call the clinic with any problems, questions or concerns. No barriers to learning was detected. I spent 15 minutes counseling the patient face to face. The total time spent in the appointment was 20 minutes and more than 50% was on counseling and review of test results     [facility], [place], MD 09/25/2013 9:28 AM

## 2013-09-25 NOTE — Telephone Encounter (Signed)
Gave pt appt for lab and MD on April 2015

## 2013-09-30 ENCOUNTER — Telehealth: Payer: Self-pay | Admitting: *Deleted

## 2013-09-30 NOTE — Telephone Encounter (Signed)
Message copied by Cathlean Cower on Mon Sep 30, 2013 10:40 AM ------      Message from: [facility], [place]      Created: Mon Sep 30, 2013  9:08 AM      Regarding: BCR/ABL results       Please let pt know the results are better      Continue same dose as planned, see him in a few months      ----- Message -----         From: [facility] Interface         Sent: 09/25/2013   8:10 AM           To: [name], MD                   ------

## 2013-09-30 NOTE — Telephone Encounter (Signed)
Informed wife of Dr. Gorsuch's message below.  She verbalized understanding.  

## 2013-10-10 ENCOUNTER — Telehealth: Payer: Self-pay | Admitting: *Deleted

## 2013-10-11 NOTE — Telephone Encounter (Signed)
Call received from [facility] (ph [phone], ext [phone]),  They left a VM requesting a Diagnosis code for a recent lab.  I called back and left VM at above number requesting they call back again.  Unsure which lab they need diagnosis code.

## 2013-10-15 ENCOUNTER — Telehealth: Payer: Self-pay | Admitting: *Deleted

## 2013-10-15 NOTE — Telephone Encounter (Signed)
Received call from [facility] asking for a diagnosis code for pt for BCR/ABL.  Informed of Diagnosis CML.

## 2013-11-22 ENCOUNTER — Ambulatory Visit (HOSPITAL_BASED_OUTPATIENT_CLINIC_OR_DEPARTMENT_OTHER): Payer: Medicare HMO | Admitting: Hematology and Oncology

## 2013-11-22 ENCOUNTER — Other Ambulatory Visit (HOSPITAL_BASED_OUTPATIENT_CLINIC_OR_DEPARTMENT_OTHER): Payer: Medicare HMO

## 2013-11-22 ENCOUNTER — Telehealth: Payer: Self-pay | Admitting: Hematology and Oncology

## 2013-11-22 VITALS — BP 134/60 | HR 78 | Temp 97.9°F | Resp 18 | Ht 69.0 in | Wt 206.2 lb

## 2013-11-22 DIAGNOSIS — C9210 Chronic myeloid leukemia, BCR/ABL-positive, not having achieved remission: Secondary | ICD-10-CM

## 2013-11-22 DIAGNOSIS — C9211 Chronic myeloid leukemia, BCR/ABL-positive, in remission: Secondary | ICD-10-CM

## 2013-11-22 DIAGNOSIS — D649 Anemia, unspecified: Secondary | ICD-10-CM

## 2013-11-22 DIAGNOSIS — D630 Anemia in neoplastic disease: Secondary | ICD-10-CM

## 2013-11-22 DIAGNOSIS — D61818 Other pancytopenia: Secondary | ICD-10-CM

## 2013-11-22 LAB — CBC WITH DIFFERENTIAL/PLATELET
BASO%: 0.6 % (ref 0.0–2.0)
Basophils Absolute: 0 10*3/uL (ref 0.0–0.1)
EOS%: 2.2 % (ref 0.0–7.0)
Eosinophils Absolute: 0.1 10*3/uL (ref 0.0–0.5)
HCT: 36.8 % — ABNORMAL LOW (ref 38.4–49.9)
HGB: 12.4 g/dL — ABNORMAL LOW (ref 13.0–17.1)
LYMPH%: 20.6 % (ref 14.0–49.0)
MCH: 32.4 pg (ref 27.2–33.4)
MCHC: 33.7 g/dL (ref 32.0–36.0)
MCV: 96.2 fL (ref 79.3–98.0)
MONO#: 0.6 10*3/uL (ref 0.1–0.9)
MONO%: 8.5 % (ref 0.0–14.0)
NEUT#: 4.5 10*3/uL (ref 1.5–6.5)
NEUT%: 68.1 % (ref 39.0–75.0)
PLATELETS: 180 10*3/uL (ref 140–400)
RBC: 3.83 10*6/uL — AB (ref 4.20–5.82)
RDW: 13.9 % (ref 11.0–14.6)
WBC: 6.6 10*3/uL (ref 4.0–10.3)
lymph#: 1.4 10*3/uL (ref 0.9–3.3)

## 2013-11-22 LAB — BASIC METABOLIC PANEL (CC13)
ANION GAP: 9 meq/L (ref 3–11)
BUN: 14.5 mg/dL (ref 7.0–26.0)
CO2: 25 mEq/L (ref 22–29)
Calcium: 9.1 mg/dL (ref 8.4–10.4)
Chloride: 111 mEq/L — ABNORMAL HIGH (ref 98–109)
Creatinine: 1 mg/dL (ref 0.7–1.3)
Glucose: 125 mg/dl (ref 70–140)
Potassium: 3.5 mEq/L (ref 3.5–5.1)
SODIUM: 145 meq/L (ref 136–145)

## 2013-11-22 LAB — LACTATE DEHYDROGENASE (CC13): LDH: 68 U/L — ABNORMAL LOW (ref 125–245)

## 2013-11-22 NOTE — Progress Notes (Signed)
[facility] OFFICE PROGRESS NOTE  Patient Care Team: [name], MD as PCP - General (Family Medicine) [name], MD as Attending Physician (Cardiology) [name], MD (Family Medicine) [name], MD (Cardiothoracic Surgery) [name], MD as Attending Physician (Vascular Surgery) [name], MD as Consulting Physician (Hematology and Oncology)  DIAGNOSIS: CML  SUMMARY OF ONCOLOGIC HISTORY:   CML (chronic myelocytic leukemia)   05/04/2012 Bone Marrow Biopsy BM confirmed diagnosis of CML in Chronic phase. BCR/ABL by PCR detected abnormalitis with b2a2 & b3a2 subtypes   05/09/2012 - 06/06/2013 Chemotherapy He was started on treatment with Dasatinib 100 mg daily   06/06/2012 Adverse Reaction Dose of medication was reduced to 50 mg daily due to pancytopenia   01/24/2013 Progression Patient was noted to have elevated blood count which and was subsequently found to be noncompliant to treatment. The patient has not been on treatment for several months because his prescription ran out. He was restarted back on treatment    INTERVAL HISTORY: Anthony Wolfe 68 y.o. male returns for further followup. He has been compliant with his medication. He has gained some weight. He has been taking his medications with him empty stomach. Denies any recent skin rash or diarrhea.  I have reviewed the past medical history, past surgical history, social history and family history with the patient and they are unchanged from previous note.  ALLERGIES:  is allergic to norvasc and sulfa antibiotics.  MEDICATIONS:  Current Outpatient Prescriptions  Medication Sig Dispense Refill  . aspirin 325 MG tablet Take 325 mg by mouth daily.      . dasatinib (SPRYCEL) 50 MG tablet Take 1 tablet (50 mg total) by mouth daily.  30 tablet  5  . sildenafil (VIAGRA) 100 MG tablet TAKE ONE TABLET BY MOUTH EVERY DAY AS NEEDED  6 tablet  11  . simvastatin (ZOCOR) 40 MG tablet TAKE ONE  TABLET BY MOUTH ONCE DAILY AT BEDTIME  90 tablet  4  . verapamil (CALAN-SR) 180 MG CR tablet Take 1 tablet (180 mg total) by mouth at bedtime.  90 tablet  4   No current facility-administered medications for this visit.    REVIEW OF SYSTEMS:   Constitutional: Denies fevers, chills or abnormal weight loss Eyes: Denies blurriness of vision Ears, nose, mouth, throat, and face: Denies mucositis or sore throat Respiratory: Denies cough, dyspnea or wheezes Cardiovascular: Denies palpitation, chest discomfort or lower extremity swelling Gastrointestinal:  Denies nausea, heartburn or change in bowel habits Skin: Denies abnormal skin rashes Lymphatics: Denies new lymphadenopathy or easy bruising Neurological:Denies numbness, tingling or new weaknesses Behavioral/Psych: Mood is stable, no new changes  All other systems were reviewed with the patient and are negative.  PHYSICAL EXAMINATION: ECOG PERFORMANCE STATUS: 0 - Asymptomatic  Filed Vitals:   11/22/13 0815  BP: 134/60  Pulse: 78  Temp: 97.9 F (36.6 C)  Resp: 18   Filed Weights   11/22/13 0815  Weight: 206 lb 3.2 oz (93.532 kg)    GENERAL:alert, no distress and comfortable. He is mildly obese SKIN: skin color, texture, turgor are normal, no rashes or significant lesions EYES: normal, Conjunctiva are pink and non-injected, sclera clear OROPHARYNX:no exudate, no erythema and lips, buccal mucosa, and tongue normal  NECK: supple, thyroid normal size, non-tender, without nodularity LYMPH:  no palpable lymphadenopathy in the cervical, axillary or inguinal LUNGS: clear to auscultation and percussion with normal breathing effort HEART: regular rate & rhythm and no murmurs and no  lower extremity edema ABDOMEN:abdomen soft, non-tender and normal bowel sounds Musculoskeletal:no cyanosis of digits and no clubbing  NEURO: alert & oriented x 3 with fluent speech, no focal motor/sensory deficits  LABORATORY DATA:  I have reviewed the data  as listed    Component Value Date/Time   NA 145 11/22/2013 0742   NA 140 08/12/2013 1533   K 3.5 11/22/2013 0742   K 3.6 08/12/2013 1533   CL 106 08/12/2013 1533   CL 110* 11/14/2012 0918   CO2 25 11/22/2013 0742   CO2 25 08/12/2013 1533   GLUCOSE 125 11/22/2013 0742   GLUCOSE 131* 08/12/2013 1533   GLUCOSE 108* 11/14/2012 0918   BUN 14.5 11/22/2013 0742   BUN 15 08/12/2013 1533   CREATININE 1.0 11/22/2013 0742   CREATININE 0.91 08/12/2013 1533   CREATININE 1.12 02/27/2009 0425   CALCIUM 9.1 11/22/2013 0742   CALCIUM 8.6 08/12/2013 1533   PROT 6.7 09/25/2013 0757   PROT 6.9 08/12/2013 1533   ALBUMIN 3.8 09/25/2013 0757   ALBUMIN 4.0 08/12/2013 1533   AST 19 09/25/2013 0757   AST 20 08/12/2013 1533   ALT 17 09/25/2013 0757   ALT 21 08/12/2013 1533   ALKPHOS 76 09/25/2013 0757   ALKPHOS 88 08/12/2013 1533   BILITOT 0.65 09/25/2013 0757   BILITOT 0.5 08/12/2013 1533   GFRNONAA 86 08/12/2013 1533   GFRNONAA >60 02/27/2009 0425   GFRAA >89 08/12/2013 1533   GFRAA  Value: >60        The eGFR has been calculated using the MDRD equation. This calculation has not been validated in all clinical situations. eGFR's persistently <60 mL/min signify possible Chronic Kidney Disease. 02/27/2009 0425    No results found for this basename: SPEP,  UPEP,   kappa and lambda light chains    Lab Results  Component Value Date   WBC 6.6 11/22/2013   NEUTROABS 4.5 11/22/2013   HGB 12.4* 11/22/2013   HCT 36.8* 11/22/2013   MCV 96.2 11/22/2013   PLT 180 11/22/2013      Chemistry      Component Value Date/Time   NA 145 11/22/2013 0742   NA 140 08/12/2013 1533   K 3.5 11/22/2013 0742   K 3.6 08/12/2013 1533   CL 106 08/12/2013 1533   CL 110* 11/14/2012 0918   CO2 25 11/22/2013 0742   CO2 25 08/12/2013 1533   BUN 14.5 11/22/2013 0742   BUN 15 08/12/2013 1533   CREATININE 1.0 11/22/2013 0742   CREATININE 0.91 08/12/2013 1533   CREATININE 1.12 02/27/2009 0425      Component Value Date/Time   CALCIUM 9.1 11/22/2013 0742   CALCIUM 8.6 08/12/2013 1533   ALKPHOS  76 09/25/2013 0757   ALKPHOS 88 08/12/2013 1533   AST 19 09/25/2013 0757   AST 20 08/12/2013 1533   ALT 17 09/25/2013 0757   ALT 21 08/12/2013 1533   BILITOT 0.65 09/25/2013 0757   BILITOT 0.5 08/12/2013 1533     ASSESSMENT & PLAN:  #1 CML The patient has not achieved remission at the one-year mark due to missing doses. BCR/ABL was trending down recently and now is trending up again.  He is currently on a small dose because of history of pancytopenia. I recommend the patient to take his medicines with food. I will bring him back in 2 months with repeat blood work to be done before he returns. If it appears that he is not responding to treatment, we could consider increase the  dose of his medicine or consider switching to another treatment. #2 anemia This is likely due to recent treatment. The patient denies recent history of bleeding such as epistaxis, hematuria or hematochezia. He is asymptomatic from the anemia. I will observe for now.  He does not require transfusion now. I will continue the chemotherapy at current dose without dosage adjustment.  If the anemia gets progressive worse in the future, I might have to delay his treatment or adjust the chemotherapy dose.   Orders Placed This Encounter  Procedures  . CBC with Differential    Standing Status: Future     Number of Occurrences:      Standing Expiration Date: 11/22/2014  . Comprehensive metabolic panel    Standing Status: Future     Number of Occurrences:      Standing Expiration Date: 11/22/2014  . BCR-ABL    With RT-PCR technique    Standing Status: Future     Number of Occurrences:      Standing Expiration Date: 11/22/2014   All questions were answered. The patient knows to call the clinic with any problems, questions or concerns. No barriers to learning was detected. I spent 25 minutes counseling the patient face to face. The total time spent in the appointment was 30 minutes and more than 50% was on counseling and review of  test results     [name], MD 11/22/2013 8:59 AM

## 2013-11-22 NOTE — Telephone Encounter (Signed)
Gave pt appt for lab and MD for June 2015 °

## 2014-01-16 ENCOUNTER — Other Ambulatory Visit (HOSPITAL_BASED_OUTPATIENT_CLINIC_OR_DEPARTMENT_OTHER): Payer: Commercial Managed Care - HMO

## 2014-01-16 DIAGNOSIS — C9211 Chronic myeloid leukemia, BCR/ABL-positive, in remission: Secondary | ICD-10-CM

## 2014-01-16 DIAGNOSIS — C9210 Chronic myeloid leukemia, BCR/ABL-positive, not having achieved remission: Secondary | ICD-10-CM

## 2014-01-16 LAB — COMPREHENSIVE METABOLIC PANEL (CC13)
ALBUMIN: 3.6 g/dL (ref 3.5–5.0)
ALT: 7 U/L (ref 0–55)
ANION GAP: 10 meq/L (ref 3–11)
AST: 12 U/L (ref 5–34)
Alkaline Phosphatase: 82 U/L (ref 40–150)
BILIRUBIN TOTAL: 0.65 mg/dL (ref 0.20–1.20)
BUN: 16 mg/dL (ref 7.0–26.0)
CHLORIDE: 110 meq/L — AB (ref 98–109)
CO2: 23 mEq/L (ref 22–29)
Calcium: 9 mg/dL (ref 8.4–10.4)
Creatinine: 1 mg/dL (ref 0.7–1.3)
Glucose: 102 mg/dl (ref 70–140)
Potassium: 3.8 mEq/L (ref 3.5–5.1)
Sodium: 144 mEq/L (ref 136–145)
TOTAL PROTEIN: 6.9 g/dL (ref 6.4–8.3)

## 2014-01-16 LAB — CBC WITH DIFFERENTIAL/PLATELET
BASO%: 0.8 % (ref 0.0–2.0)
BASOS ABS: 0.1 10*3/uL (ref 0.0–0.1)
EOS%: 1.8 % (ref 0.0–7.0)
Eosinophils Absolute: 0.1 10*3/uL (ref 0.0–0.5)
HEMATOCRIT: 36.7 % — AB (ref 38.4–49.9)
HEMOGLOBIN: 12.4 g/dL — AB (ref 13.0–17.1)
LYMPH#: 1.1 10*3/uL (ref 0.9–3.3)
LYMPH%: 18 % (ref 14.0–49.0)
MCH: 32.3 pg (ref 27.2–33.4)
MCHC: 33.7 g/dL (ref 32.0–36.0)
MCV: 96 fL (ref 79.3–98.0)
MONO#: 0.5 10*3/uL (ref 0.1–0.9)
MONO%: 8.3 % (ref 0.0–14.0)
NEUT#: 4.5 10*3/uL (ref 1.5–6.5)
NEUT%: 71.1 % (ref 39.0–75.0)
PLATELETS: 168 10*3/uL (ref 140–400)
RBC: 3.82 10*6/uL — ABNORMAL LOW (ref 4.20–5.82)
RDW: 14.1 % (ref 11.0–14.6)
WBC: 6.3 10*3/uL (ref 4.0–10.3)

## 2014-01-22 ENCOUNTER — Ambulatory Visit (HOSPITAL_BASED_OUTPATIENT_CLINIC_OR_DEPARTMENT_OTHER): Payer: Commercial Managed Care - HMO | Admitting: Hematology and Oncology

## 2014-01-22 ENCOUNTER — Telehealth: Payer: Self-pay | Admitting: Hematology and Oncology

## 2014-01-22 ENCOUNTER — Encounter: Payer: Self-pay | Admitting: Hematology and Oncology

## 2014-01-22 ENCOUNTER — Other Ambulatory Visit: Payer: Self-pay | Admitting: *Deleted

## 2014-01-22 VITALS — BP 147/70 | HR 83 | Temp 97.9°F | Resp 18 | Ht 69.0 in | Wt 202.1 lb

## 2014-01-22 DIAGNOSIS — C9210 Chronic myeloid leukemia, BCR/ABL-positive, not having achieved remission: Secondary | ICD-10-CM

## 2014-01-22 DIAGNOSIS — C9211 Chronic myeloid leukemia, BCR/ABL-positive, in remission: Secondary | ICD-10-CM

## 2014-01-22 DIAGNOSIS — D630 Anemia in neoplastic disease: Secondary | ICD-10-CM

## 2014-01-22 MED ORDER — DASATINIB 100 MG PO TABS: 100.0000 mg | ORAL_TABLET | Freq: Every day | ORAL | Status: DC

## 2014-01-22 NOTE — Progress Notes (Signed)
[facility] OFFICE PROGRESS NOTE  Patient Care Team: [name], MD as PCP - General (Family Medicine) [name], MD as Attending Physician (Cardiology) [name], MD (Family Medicine) [name], MD (Cardiothoracic Surgery) [name], MD as Attending Physician (Vascular Surgery) [name], MD as Consulting Physician (Hematology and Oncology)  SUMMARY OF ONCOLOGIC HISTORY:   Chronic myelogenous leukemia   05/04/2012 Bone Marrow Biopsy BM confirmed diagnosis of CML in Chronic phase. BCR/ABL by PCR detected abnormalitis with b2a2 & b3a2 subtypes   05/09/2012 - 06/06/2013 Chemotherapy He was started on treatment with Dasatinib 100 mg daily   06/06/2012 Adverse Reaction Dose of medication was reduced to 50 mg daily due to pancytopenia   01/24/2013 Progression Patient was noted to have elevated blood count which and was subsequently found to be noncompliant to treatment. The patient has not been on treatment for several months because his prescription ran out. He was restarted back on treatment    INTERVAL HISTORY: Please see below for problem oriented charting. He feels well without any side effects of treatment. Denies any shortness of breath, fluid retention, skin rash or diarrhea.  REVIEW OF SYSTEMS:   Constitutional: Denies fevers, chills or abnormal weight loss Eyes: Denies blurriness of vision Ears, nose, mouth, throat, and face: Denies mucositis or sore throat Respiratory: Denies cough, dyspnea or wheezes Cardiovascular: Denies palpitation, chest discomfort or lower extremity swelling Gastrointestinal:  Denies nausea, heartburn or change in bowel habits Skin: Denies abnormal skin rashes Lymphatics: Denies new lymphadenopathy or easy bruising Neurological:Denies numbness, tingling or new weaknesses Behavioral/Psych: Mood is stable, no new changes  All other systems were reviewed with the patient and are negative.  I have reviewed the  past medical history, past surgical history, social history and family history with the patient and they are unchanged from previous note.  ALLERGIES:  is allergic to norvasc and sulfa antibiotics.  MEDICATIONS:  Current Outpatient Prescriptions  Medication Sig Dispense Refill  . aspirin 325 MG tablet Take 325 mg by mouth daily.      . dasatinib (SPRYCEL) 100 MG tablet Take 1 tablet (100 mg total) by mouth daily.  30 tablet  5  . sildenafil (VIAGRA) 100 MG tablet TAKE ONE TABLET BY MOUTH EVERY DAY AS NEEDED  6 tablet  11  . simvastatin (ZOCOR) 40 MG tablet TAKE ONE TABLET BY MOUTH ONCE DAILY AT BEDTIME  90 tablet  4  . verapamil (CALAN-SR) 180 MG CR tablet Take 1 tablet (180 mg total) by mouth at bedtime.  90 tablet  4   No current facility-administered medications for this visit.    PHYSICAL EXAMINATION: ECOG PERFORMANCE STATUS: 0 - Asymptomatic  Filed Vitals:   01/22/14 0846  BP: 147/70  Pulse: 83  Temp: 97.9 F (36.6 C)  Resp: 18   Filed Weights   01/22/14 0846  Weight: 202 lb 1.6 oz (91.672 kg)    GENERAL:alert, no distress and comfortable SKIN: skin color, texture, turgor are normal, no rashes or significant lesions EYES: normal, Conjunctiva are pink and non-injected, sclera clear OROPHARYNX:no exudate, no erythema and lips, buccal mucosa, and tongue normal  NECK: supple, thyroid normal size, non-tender, without nodularity LYMPH:  no palpable lymphadenopathy in the cervical, axillary or inguinal LUNGS: clear to auscultation and percussion with normal breathing effort HEART: regular rate & rhythm and no murmurs and no lower extremity edema ABDOMEN:abdomen soft, non-tender and normal bowel sounds Musculoskeletal:no cyanosis of digits and no clubbing  NEURO:  alert & oriented x 3 with fluent speech, no focal motor/sensory deficits  LABORATORY DATA:  I have reviewed the data as listed    Component Value Date/Time   NA 144 01/16/2014 0805   NA 140 08/12/2013 1533   K 3.8  01/16/2014 0805   K 3.6 08/12/2013 1533   CL 106 08/12/2013 1533   CL 110* 11/14/2012 0918   CO2 23 01/16/2014 0805   CO2 25 08/12/2013 1533   GLUCOSE 102 01/16/2014 0805   GLUCOSE 131* 08/12/2013 1533   GLUCOSE 108* 11/14/2012 0918   BUN 16.0 01/16/2014 0805   BUN 15 08/12/2013 1533   CREATININE 1.0 01/16/2014 0805   CREATININE 0.91 08/12/2013 1533   CREATININE 1.12 02/27/2009 0425   CALCIUM 9.0 01/16/2014 0805   CALCIUM 8.6 08/12/2013 1533   PROT 6.9 01/16/2014 0805   PROT 6.9 08/12/2013 1533   ALBUMIN 3.6 01/16/2014 0805   ALBUMIN 4.0 08/12/2013 1533   AST 12 01/16/2014 0805   AST 20 08/12/2013 1533   ALT 7 01/16/2014 0805   ALT 21 08/12/2013 1533   ALKPHOS 82 01/16/2014 0805   ALKPHOS 88 08/12/2013 1533   BILITOT 0.65 01/16/2014 0805   BILITOT 0.5 08/12/2013 1533   GFRNONAA 86 08/12/2013 1533   GFRNONAA >60 02/27/2009 0425   GFRAA >89 08/12/2013 1533   GFRAA  Value: >60        The eGFR has been calculated using the MDRD equation. This calculation has not been validated in all clinical situations. eGFR's persistently <60 mL/min signify possible Chronic Kidney Disease. 02/27/2009 0425    No results found for this basename: SPEP, UPEP,  kappa and lambda light chains    Lab Results  Component Value Date   WBC 6.3 01/16/2014   NEUTROABS 4.5 01/16/2014   HGB 12.4* 01/16/2014   HCT 36.7* 01/16/2014   MCV 96.0 01/16/2014   PLT 168 01/16/2014      Chemistry      Component Value Date/Time   NA 144 01/16/2014 0805   NA 140 08/12/2013 1533   K 3.8 01/16/2014 0805   K 3.6 08/12/2013 1533   CL 106 08/12/2013 1533   CL 110* 11/14/2012 0918   CO2 23 01/16/2014 0805   CO2 25 08/12/2013 1533   BUN 16.0 01/16/2014 0805   BUN 15 08/12/2013 1533   CREATININE 1.0 01/16/2014 0805   CREATININE 0.91 08/12/2013 1533   CREATININE 1.12 02/27/2009 0425      Component Value Date/Time   CALCIUM 9.0 01/16/2014 0805   CALCIUM 8.6 08/12/2013 1533   ALKPHOS 82 01/16/2014 0805   ALKPHOS 88 08/12/2013 1533   AST 12 01/16/2014 0805   AST 20 08/12/2013 1533   ALT  7 01/16/2014 0805   ALT 21 08/12/2013 1533   BILITOT 0.65 01/16/2014 0805   BILITOT 0.5 08/12/2013 1533     ASSESSMENT & PLAN:  Chronic myelogenous leukemia His last quantitative PCR show worsening disease control. I am concerned about potential mutation. I recommend increasing his chemotherapy to 100 mg daily and recheck in [redacted] weeks along with mutation study. If he does not respond to treatment, we might have to switch him to another treatment.  Anemia in neoplastic disease This is likely due to recent treatment. The patient denies recent history of bleeding such as epistaxis, hematuria or hematochezia. He is asymptomatic from the anemia. I will observe for now.  He does not require transfusion now. I will continue the chemotherapy at current dose without dosage  adjustment.  If the anemia gets progressive worse in the future, I might have to delay his treatment or adjust the chemotherapy dose.    Orders Placed This Encounter  Procedures  . CBC with Differential    Standing Status: Future     Number of Occurrences:      Standing Expiration Date: 01/22/2015  . Comprehensive metabolic panel    Standing Status: Future     Number of Occurrences:      Standing Expiration Date: 01/22/2015  . Lactate dehydrogenase    Standing Status: Future     Number of Occurrences:      Standing Expiration Date: 01/22/2015  . BCR-ABL    With RT-PCR technique    Standing Status: Future     Number of Occurrences:      Standing Expiration Date: 01/22/2015  . [place]e Nodal Test    Kinase mutation study    Standing Status: Future     Number of Occurrences:      Standing Expiration Date: 01/22/2015   All questions were answered. The patient knows to call the clinic with any problems, questions or concerns. No barriers to learning was detected.    Granger, [place], MD 01/22/2014 9:15 AM

## 2014-01-22 NOTE — Telephone Encounter (Signed)
Gave pt appt for lab and MD for july

## 2014-01-22 NOTE — Assessment & Plan Note (Signed)
This is likely due to recent treatment. The patient denies recent history of bleeding such as epistaxis, hematuria or hematochezia. He is asymptomatic from the anemia. I will observe for now.  He does not require transfusion now. I will continue the chemotherapy at current dose without dosage adjustment.  If the anemia gets progressive worse in the future, I might have to delay his treatment or adjust the chemotherapy dose.  

## 2014-01-22 NOTE — Assessment & Plan Note (Signed)
His last quantitative PCR show worsening disease control. I am concerned about potential mutation. I recommend increasing his chemotherapy to 100 mg daily and recheck in [redacted] weeks along with mutation study. If he does not respond to treatment, we might have to switch him to another treatment.

## 2014-02-26 ENCOUNTER — Other Ambulatory Visit (HOSPITAL_BASED_OUTPATIENT_CLINIC_OR_DEPARTMENT_OTHER): Payer: Commercial Managed Care - HMO

## 2014-02-26 ENCOUNTER — Other Ambulatory Visit: Payer: Self-pay | Admitting: Hematology and Oncology

## 2014-02-26 DIAGNOSIS — C9210 Chronic myeloid leukemia, BCR/ABL-positive, not having achieved remission: Secondary | ICD-10-CM

## 2014-02-26 DIAGNOSIS — C9211 Chronic myeloid leukemia, BCR/ABL-positive, in remission: Secondary | ICD-10-CM

## 2014-02-26 LAB — LACTATE DEHYDROGENASE (CC13): LDH: 68 U/L — AB (ref 125–245)

## 2014-02-26 LAB — COMPREHENSIVE METABOLIC PANEL (CC13)
ALBUMIN: 3.5 g/dL (ref 3.5–5.0)
ALT: 11 U/L (ref 0–55)
AST: 12 U/L (ref 5–34)
Alkaline Phosphatase: 79 U/L (ref 40–150)
Anion Gap: 12 mEq/L — ABNORMAL HIGH (ref 3–11)
BUN: 11.9 mg/dL (ref 7.0–26.0)
CALCIUM: 9 mg/dL (ref 8.4–10.4)
CO2: 21 mEq/L — ABNORMAL LOW (ref 22–29)
Chloride: 111 mEq/L — ABNORMAL HIGH (ref 98–109)
Creatinine: 1.1 mg/dL (ref 0.7–1.3)
Glucose: 124 mg/dl (ref 70–140)
POTASSIUM: 3.7 meq/L (ref 3.5–5.1)
SODIUM: 143 meq/L (ref 136–145)
TOTAL PROTEIN: 6.7 g/dL (ref 6.4–8.3)
Total Bilirubin: 0.38 mg/dL (ref 0.20–1.20)

## 2014-02-26 LAB — CBC WITH DIFFERENTIAL/PLATELET
BASO%: 1 % (ref 0.0–2.0)
BASOS ABS: 0.1 10*3/uL (ref 0.0–0.1)
EOS ABS: 0.2 10*3/uL (ref 0.0–0.5)
EOS%: 2.6 % (ref 0.0–7.0)
HCT: 35.7 % — ABNORMAL LOW (ref 38.4–49.9)
HEMOGLOBIN: 11.8 g/dL — AB (ref 13.0–17.1)
LYMPH%: 22.2 % (ref 14.0–49.0)
MCH: 32.1 pg (ref 27.2–33.4)
MCHC: 33.2 g/dL (ref 32.0–36.0)
MCV: 96.8 fL (ref 79.3–98.0)
MONO#: 0.5 10*3/uL (ref 0.1–0.9)
MONO%: 7.8 % (ref 0.0–14.0)
NEUT%: 66.4 % (ref 39.0–75.0)
NEUTROS ABS: 3.9 10*3/uL (ref 1.5–6.5)
PLATELETS: 164 10*3/uL (ref 140–400)
RBC: 3.69 10*6/uL — AB (ref 4.20–5.82)
RDW: 14.7 % — ABNORMAL HIGH (ref 11.0–14.6)
WBC: 5.8 10*3/uL (ref 4.0–10.3)
lymph#: 1.3 10*3/uL (ref 0.9–3.3)

## 2014-03-05 LAB — IMATINIB RESISTANCE IN CML (GENPATH)

## 2014-03-06 ENCOUNTER — Encounter: Payer: Self-pay | Admitting: Hematology and Oncology

## 2014-03-06 ENCOUNTER — Telehealth: Payer: Self-pay | Admitting: Hematology and Oncology

## 2014-03-06 ENCOUNTER — Ambulatory Visit (HOSPITAL_BASED_OUTPATIENT_CLINIC_OR_DEPARTMENT_OTHER): Payer: Commercial Managed Care - HMO | Admitting: Hematology and Oncology

## 2014-03-06 VITALS — BP 142/65 | HR 67 | Temp 98.4°F | Resp 18 | Ht 69.0 in | Wt 201.2 lb

## 2014-03-06 DIAGNOSIS — C9210 Chronic myeloid leukemia, BCR/ABL-positive, not having achieved remission: Secondary | ICD-10-CM

## 2014-03-06 DIAGNOSIS — D630 Anemia in neoplastic disease: Secondary | ICD-10-CM

## 2014-03-06 NOTE — Assessment & Plan Note (Signed)
Thankfully, the patient has no side effects with increased dose of chemotherapy. Repeat blood work showed he has no mutation and the BCR/ABL by PCR technique is improving. I will see him back in 2 months with history, physical examination, and blood work ahead of time.

## 2014-03-06 NOTE — Progress Notes (Signed)
[facility] OFFICE PROGRESS NOTE  Patient Care Team: [name], MD as PCP - General (Family Medicine) [name], MD as Attending Physician (Cardiology) [name], MD (Family Medicine) [name], MD (Cardiothoracic Surgery) [name], MD as Attending Physician (Vascular Surgery) [name], MD as Consulting Physician (Hematology and Oncology)  SUMMARY OF ONCOLOGIC HISTORY:   Chronic myelogenous leukemia   05/04/2012 Bone Marrow Biopsy BM confirmed diagnosis of CML in Chronic phase. BCR/ABL by PCR detected abnormalitis with b2a2 & b3a2 subtypes   05/09/2012 - 06/06/2013 Chemotherapy He was started on treatment with Dasatinib 100 mg daily   06/06/2012 Adverse Reaction Dose of medication was reduced to 50 mg daily due to pancytopenia   01/24/2013 Progression Patient was noted to have elevated blood count which and was subsequently found to be noncompliant to treatment. The patient has not been on treatment for several months because his prescription ran out. He was restarted back on treatment   01/16/2014 Progression Bcr/ABL by PCR is worse. Dose of Dasatinib was increased to 100 mg.   02/26/2014 Tumor Marker Blood work for ABL kinase mutation was negative.    INTERVAL HISTORY: Please see below for problem oriented charting. He returns today to followup on test results and treatment for CML. With increased dose of chemotherapy, he has no worsening side effects. Denies any rash, diarrhea, fatigue or shortness of breath. Denies recent infection.  REVIEW OF SYSTEMS:   Constitutional: Denies fevers, chills or abnormal weight loss Eyes: Denies blurriness of vision Ears, nose, mouth, throat, and face: Denies mucositis or sore throat Respiratory: Denies cough, dyspnea or wheezes Cardiovascular: Denies palpitation, chest discomfort or lower extremity swelling Gastrointestinal:  Denies nausea, heartburn or change in bowel habits Skin: Denies abnormal  skin rashes Lymphatics: Denies new lymphadenopathy or easy bruising Neurological:Denies numbness, tingling or new weaknesses Behavioral/Psych: Mood is stable, no new changes  All other systems were reviewed with the patient and are negative.  I have reviewed the past medical history, past surgical history, social history and family history with the patient and they are unchanged from previous note.  ALLERGIES:  is allergic to norvasc and sulfa antibiotics.  MEDICATIONS:  Current Outpatient Prescriptions  Medication Sig Dispense Refill  . aspirin 325 MG tablet Take 325 mg by mouth daily.      . dasatinib (SPRYCEL) 100 MG tablet Take 1 tablet (100 mg total) by mouth daily.  30 tablet  5  . sildenafil (VIAGRA) 100 MG tablet TAKE ONE TABLET BY MOUTH EVERY DAY AS NEEDED  6 tablet  11  . simvastatin (ZOCOR) 40 MG tablet TAKE ONE TABLET BY MOUTH ONCE DAILY AT BEDTIME  90 tablet  4  . verapamil (CALAN-SR) 180 MG CR tablet Take 1 tablet (180 mg total) by mouth at bedtime.  90 tablet  4   No current facility-administered medications for this visit.    PHYSICAL EXAMINATION: ECOG PERFORMANCE STATUS: 0 - Asymptomatic  Filed Vitals:   03/06/14 0829  BP: 142/65  Pulse: 67  Temp: 98.4 F (36.9 C)  Resp: 18   Filed Weights   03/06/14 0829  Weight: 201 lb 3.2 oz (91.264 kg)    GENERAL:alert, no distress and comfortable SKIN: skin color, texture, turgor are normal, no rashes or significant lesions EYES: normal, Conjunctiva are pink and non-injected, sclera clear OROPHARYNX:no exudate, no erythema and lips, buccal mucosa, and tongue normal  NECK: supple, thyroid normal size, non-tender, without nodularity LYMPH:  no palpable lymphadenopathy  in the cervical, axillary or inguinal LUNGS: clear to auscultation and percussion with normal breathing effort HEART: regular rate & rhythm and no murmurs and no lower extremity edema ABDOMEN:abdomen soft, non-tender and normal bowel  sounds Musculoskeletal:no cyanosis of digits and no clubbing  NEURO: alert & oriented x 3 with fluent speech, no focal motor/sensory deficits  LABORATORY DATA:  I have reviewed the data as listed    Component Value Date/Time   NA 143 02/26/2014 0755   NA 140 08/12/2013 1533   K 3.7 02/26/2014 0755   K 3.6 08/12/2013 1533   CL 106 08/12/2013 1533   CL 110* 11/14/2012 0918   CO2 21* 02/26/2014 0755   CO2 25 08/12/2013 1533   GLUCOSE 124 02/26/2014 0755   GLUCOSE 131* 08/12/2013 1533   GLUCOSE 108* 11/14/2012 0918   BUN 11.9 02/26/2014 0755   BUN 15 08/12/2013 1533   CREATININE 1.1 02/26/2014 0755   CREATININE 0.91 08/12/2013 1533   CREATININE 1.12 02/27/2009 0425   CALCIUM 9.0 02/26/2014 0755   CALCIUM 8.6 08/12/2013 1533   PROT 6.7 02/26/2014 0755   PROT 6.9 08/12/2013 1533   ALBUMIN 3.5 02/26/2014 0755   ALBUMIN 4.0 08/12/2013 1533   AST 12 02/26/2014 0755   AST 20 08/12/2013 1533   ALT 11 02/26/2014 0755   ALT 21 08/12/2013 1533   ALKPHOS 79 02/26/2014 0755   ALKPHOS 88 08/12/2013 1533   BILITOT 0.38 02/26/2014 0755   BILITOT 0.5 08/12/2013 1533   GFRNONAA 86 08/12/2013 1533   GFRNONAA >60 02/27/2009 0425   GFRAA >89 08/12/2013 1533   GFRAA  Value: >60        The eGFR has been calculated using the MDRD equation. This calculation has not been validated in all clinical situations. eGFR's persistently <60 mL/min signify possible Chronic Kidney Disease. 02/27/2009 0425    No results found for this basename: SPEP, UPEP,  kappa and lambda light chains    Lab Results  Component Value Date   WBC 5.8 02/26/2014   NEUTROABS 3.9 02/26/2014   HGB 11.8* 02/26/2014   HCT 35.7* 02/26/2014   MCV 96.8 02/26/2014   PLT 164 02/26/2014      Chemistry      Component Value Date/Time   NA 143 02/26/2014 0755   NA 140 08/12/2013 1533   K 3.7 02/26/2014 0755   K 3.6 08/12/2013 1533   CL 106 08/12/2013 1533   CL 110* 11/14/2012 0918   CO2 21* 02/26/2014 0755   CO2 25 08/12/2013 1533   BUN 11.9 02/26/2014 0755   BUN 15 08/12/2013 1533    CREATININE 1.1 02/26/2014 0755   CREATININE 0.91 08/12/2013 1533   CREATININE 1.12 02/27/2009 0425      Component Value Date/Time   CALCIUM 9.0 02/26/2014 0755   CALCIUM 8.6 08/12/2013 1533   ALKPHOS 79 02/26/2014 0755   ALKPHOS 88 08/12/2013 1533   AST 12 02/26/2014 0755   AST 20 08/12/2013 1533   ALT 11 02/26/2014 0755   ALT 21 08/12/2013 1533   BILITOT 0.38 02/26/2014 0755   BILITOT 0.5 08/12/2013 1533      ASSESSMENT & PLAN:  Chronic myelogenous leukemia Thankfully, the patient has no side effects with increased dose of chemotherapy. Repeat blood work showed he has no mutation and the BCR/ABL by PCR technique is improving. I will see him back in 2 months with history, physical examination, and blood work ahead of time.  Anemia in neoplastic disease This is likely due  to recent treatment. The patient denies recent history of bleeding such as epistaxis, hematuria or hematochezia. He is asymptomatic from the anemia. I will observe for now.  He does not require transfusion now. I will continue the chemotherapy at current dose without dosage adjustment.  If the anemia gets progressive worse in the future, I might have to delay his treatment or adjust the chemotherapy dose.   All questions were answered. The patient knows to call the clinic with any problems, questions or concerns. No barriers to learning was detected. I spent 15 minutes counseling the patient face to face. The total time spent in the appointment was 20 minutes and more than 50% was on counseling and review of test results     [facility], [place], MD 03/06/2014 8:43 AM

## 2014-03-06 NOTE — Telephone Encounter (Signed)
gv adn printed appt sched and avs for pt for Sept.... °

## 2014-03-06 NOTE — Assessment & Plan Note (Signed)
This is likely due to recent treatment. The patient denies recent history of bleeding such as epistaxis, hematuria or hematochezia. He is asymptomatic from the anemia. I will observe for now.  He does not require transfusion now. I will continue the chemotherapy at current dose without dosage adjustment.  If the anemia gets progressive worse in the future, I might have to delay his treatment or adjust the chemotherapy dose.  

## 2014-04-28 ENCOUNTER — Other Ambulatory Visit: Payer: Self-pay | Admitting: Hematology and Oncology

## 2014-04-28 DIAGNOSIS — C9210 Chronic myeloid leukemia, BCR/ABL-positive, not having achieved remission: Secondary | ICD-10-CM

## 2014-04-29 ENCOUNTER — Other Ambulatory Visit (HOSPITAL_BASED_OUTPATIENT_CLINIC_OR_DEPARTMENT_OTHER): Payer: Commercial Managed Care - HMO

## 2014-04-29 DIAGNOSIS — C9210 Chronic myeloid leukemia, BCR/ABL-positive, not having achieved remission: Secondary | ICD-10-CM

## 2014-04-29 LAB — COMPREHENSIVE METABOLIC PANEL (CC13)
ALK PHOS: 73 U/L (ref 40–150)
AST: 13 U/L (ref 5–34)
Albumin: 3.4 g/dL — ABNORMAL LOW (ref 3.5–5.0)
Anion Gap: 8 mEq/L (ref 3–11)
BILIRUBIN TOTAL: 0.37 mg/dL (ref 0.20–1.20)
BUN: 12.1 mg/dL (ref 7.0–26.0)
CO2: 24 mEq/L (ref 22–29)
CREATININE: 1 mg/dL (ref 0.7–1.3)
Calcium: 8.6 mg/dL (ref 8.4–10.4)
Chloride: 112 mEq/L — ABNORMAL HIGH (ref 98–109)
Glucose: 133 mg/dl (ref 70–140)
Potassium: 3.9 mEq/L (ref 3.5–5.1)
Sodium: 144 mEq/L (ref 136–145)
Total Protein: 6.6 g/dL (ref 6.4–8.3)

## 2014-04-29 LAB — CBC WITH DIFFERENTIAL/PLATELET
BASO%: 0.2 % (ref 0.0–2.0)
Basophils Absolute: 0 10*3/uL (ref 0.0–0.1)
EOS ABS: 0.1 10*3/uL (ref 0.0–0.5)
EOS%: 1.9 % (ref 0.0–7.0)
HCT: 35.3 % — ABNORMAL LOW (ref 38.4–49.9)
HGB: 11.2 g/dL — ABNORMAL LOW (ref 13.0–17.1)
LYMPH#: 1.1 10*3/uL (ref 0.9–3.3)
LYMPH%: 23.4 % (ref 14.0–49.0)
MCH: 31.1 pg (ref 27.2–33.4)
MCHC: 31.7 g/dL — AB (ref 32.0–36.0)
MCV: 98.1 fL — ABNORMAL HIGH (ref 79.3–98.0)
MONO#: 0.4 10*3/uL (ref 0.1–0.9)
MONO%: 8.5 % (ref 0.0–14.0)
NEUT#: 3.2 10*3/uL (ref 1.5–6.5)
NEUT%: 66 % (ref 39.0–75.0)
Platelets: 145 10*3/uL (ref 140–400)
RBC: 3.6 10*6/uL — AB (ref 4.20–5.82)
RDW: 14.8 % — AB (ref 11.0–14.6)
WBC: 4.8 10*3/uL (ref 4.0–10.3)

## 2014-04-29 LAB — LACTATE DEHYDROGENASE (CC13): LDH: 78 U/L — AB (ref 125–245)

## 2014-04-30 ENCOUNTER — Other Ambulatory Visit: Payer: Self-pay | Admitting: Family Medicine

## 2014-04-30 NOTE — Telephone Encounter (Signed)
Medication refilled per protocol. 

## 2014-05-06 ENCOUNTER — Ambulatory Visit (HOSPITAL_BASED_OUTPATIENT_CLINIC_OR_DEPARTMENT_OTHER): Payer: Commercial Managed Care - HMO | Admitting: Hematology and Oncology

## 2014-05-06 ENCOUNTER — Encounter: Payer: Self-pay | Admitting: Hematology and Oncology

## 2014-05-06 VITALS — BP 149/58 | HR 67 | Temp 98.2°F | Resp 18 | Ht 69.0 in | Wt 199.5 lb

## 2014-05-06 DIAGNOSIS — C9210 Chronic myeloid leukemia, BCR/ABL-positive, not having achieved remission: Secondary | ICD-10-CM

## 2014-05-06 DIAGNOSIS — D630 Anemia in neoplastic disease: Secondary | ICD-10-CM

## 2014-05-06 NOTE — Assessment & Plan Note (Signed)
This is likely due to recent treatment. The patient denies recent history of bleeding such as epistaxis, hematuria or hematochezia. He is asymptomatic from the anemia. I will observe for now.  He does not require transfusion now. I will continue the chemotherapy at current dose without dosage adjustment.  If the anemia gets progressive worse in the future, I might have to delay his treatment or adjust the chemotherapy dose.  

## 2014-05-06 NOTE — Assessment & Plan Note (Signed)
Thankfully, the patient has no side effects with increased dose of chemotherapy. Repeat blood work showed he has no mutation and the BCR/ABL by PCR technique is improving. I will see him back in 3 months with history, physical examination, and blood work ahead of time. 

## 2014-05-06 NOTE — Progress Notes (Signed)
[facility] OFFICE PROGRESS NOTE  Patient Care Team: [name], MD as PCP - General (Family Medicine) [name], MD as Attending Physician (Cardiology) [name], MD (Family Medicine) [name], MD (Cardiothoracic Surgery) [name], MD as Attending Physician (Vascular Surgery) [name], MD as Consulting Physician (Hematology and Oncology)  SUMMARY OF ONCOLOGIC HISTORY:   Chronic myelogenous leukemia   05/04/2012 Bone Marrow Biopsy BM confirmed diagnosis of CML in Chronic phase. BCR/ABL by PCR detected abnormalitis with b2a2 & b3a2 subtypes   05/09/2012 - 06/06/2013 Chemotherapy He was started on treatment with Dasatinib 100 mg daily   06/06/2012 Adverse Reaction Dose of medication was reduced to 50 mg daily due to pancytopenia   01/24/2013 Progression Patient was noted to have elevated blood count which and was subsequently found to be noncompliant to treatment. The patient has not been on treatment for several months because his prescription ran out. He was restarted back on treatment   01/16/2014 Progression Bcr/ABL by PCR is worse. Dose of Dasatinib was increased to 100 mg.   02/26/2014 Tumor Marker Blood work for ABL kinase mutation was negative.    INTERVAL HISTORY: Please see below for problem oriented charting. He feels well. Denies any chest pain shortness of breath or fluid retention.  REVIEW OF SYSTEMS:   Constitutional: Denies fevers, chills or abnormal weight loss Eyes: Denies blurriness of vision Ears, nose, mouth, throat, and face: Denies mucositis or sore throat Respiratory: Denies cough, dyspnea or wheezes Cardiovascular: Denies palpitation, chest discomfort or lower extremity swelling Gastrointestinal:  Denies nausea, heartburn or change in bowel habits Skin: Denies abnormal skin rashes Lymphatics: Denies new lymphadenopathy or easy bruising Neurological:Denies numbness, tingling or new weaknesses Behavioral/Psych:  Mood is stable, no new changes  All other systems were reviewed with the patient and are negative.  I have reviewed the past medical history, past surgical history, social history and family history with the patient and they are unchanged from previous note.  ALLERGIES:  is allergic to norvasc and sulfa antibiotics.  MEDICATIONS:  Current Outpatient Prescriptions  Medication Sig Dispense Refill  . aspirin 325 MG tablet Take 325 mg by mouth daily.      . dasatinib (SPRYCEL) 100 MG tablet Take 1 tablet (100 mg total) by mouth daily.  30 tablet  5  . sildenafil (VIAGRA) 100 MG tablet TAKE ONE TABLET BY MOUTH EVERY DAY AS NEEDED  6 tablet  11  . simvastatin (ZOCOR) 40 MG tablet TAKE 1 TABLET EVERY DAY AT BEDTIME  90 tablet  0  . verapamil (CALAN-SR) 180 MG CR tablet TAKE 1 TABLET AT BEDTIME  90 tablet  0   No current facility-administered medications for this visit.    PHYSICAL EXAMINATION: ECOG PERFORMANCE STATUS: 0 - Asymptomatic  Filed Vitals:   05/06/14 0845  BP: 149/58  Pulse: 67  Temp: 98.2 F (36.8 C)  Resp: 18   Filed Weights   05/06/14 0845  Weight: 199 lb 8 oz (90.493 kg)    GENERAL:alert, no distress and comfortable SKIN: skin color, texture, turgor are normal, no rashes or significant lesions EYES: normal, Conjunctiva are pink and non-injected, sclera clear OROPHARYNX:no exudate, no erythema and lips, buccal mucosa, and tongue normal  NECK: supple, thyroid normal size, non-tender, without nodularity LYMPH:  no palpable lymphadenopathy in the cervical, axillary or inguinal LUNGS: clear to auscultation and percussion with normal breathing effort HEART: regular rate & rhythm and no murmurs and no lower extremity edema  ABDOMEN:abdomen soft, non-tender and normal bowel sounds Musculoskeletal:no cyanosis of digits and no clubbing  NEURO: alert & oriented x 3 with fluent speech, no focal motor/sensory deficits  LABORATORY DATA:  I have reviewed the data as listed     Component Value Date/Time   NA 144 04/29/2014 0835   NA 140 08/12/2013 1533   K 3.9 04/29/2014 0835   K 3.6 08/12/2013 1533   CL 106 08/12/2013 1533   CL 110* 11/14/2012 0918   CO2 24 04/29/2014 0835   CO2 25 08/12/2013 1533   GLUCOSE 133 04/29/2014 0835   GLUCOSE 131* 08/12/2013 1533   GLUCOSE 108* 11/14/2012 0918   BUN 12.1 04/29/2014 0835   BUN 15 08/12/2013 1533   CREATININE 1.0 04/29/2014 0835   CREATININE 0.91 08/12/2013 1533   CREATININE 1.12 02/27/2009 0425   CALCIUM 8.6 04/29/2014 0835   CALCIUM 8.6 08/12/2013 1533   PROT 6.6 04/29/2014 0835   PROT 6.9 08/12/2013 1533   ALBUMIN 3.4* 04/29/2014 0835   ALBUMIN 4.0 08/12/2013 1533   AST 13 04/29/2014 0835   AST 20 08/12/2013 1533   ALT <6 04/29/2014 0835   ALT 21 08/12/2013 1533   ALKPHOS 73 04/29/2014 0835   ALKPHOS 88 08/12/2013 1533   BILITOT 0.37 04/29/2014 0835   BILITOT 0.5 08/12/2013 1533   GFRNONAA 86 08/12/2013 1533   GFRNONAA >60 02/27/2009 0425   GFRAA >89 08/12/2013 1533   GFRAA  Value: >60        The eGFR has been calculated using the MDRD equation. This calculation has not been validated in all clinical situations. eGFR's persistently <60 mL/min signify possible Chronic Kidney Disease. 02/27/2009 0425    No results found for this basename: SPEP, UPEP,  kappa and lambda light chains    Lab Results  Component Value Date   WBC 4.8 04/29/2014   NEUTROABS 3.2 04/29/2014   HGB 11.2* 04/29/2014   HCT 35.3* 04/29/2014   MCV 98.1* 04/29/2014   PLT 145 04/29/2014      Chemistry      Component Value Date/Time   NA 144 04/29/2014 0835   NA 140 08/12/2013 1533   K 3.9 04/29/2014 0835   K 3.6 08/12/2013 1533   CL 106 08/12/2013 1533   CL 110* 11/14/2012 0918   CO2 24 04/29/2014 0835   CO2 25 08/12/2013 1533   BUN 12.1 04/29/2014 0835   BUN 15 08/12/2013 1533   CREATININE 1.0 04/29/2014 0835   CREATININE 0.91 08/12/2013 1533   CREATININE 1.12 02/27/2009 0425      Component Value Date/Time   CALCIUM 8.6 04/29/2014 0835   CALCIUM 8.6 08/12/2013 1533   ALKPHOS 73 04/29/2014  0835   ALKPHOS 88 08/12/2013 1533   AST 13 04/29/2014 0835   AST 20 08/12/2013 1533   ALT <6 04/29/2014 0835   ALT 21 08/12/2013 1533   BILITOT 0.37 04/29/2014 0835   BILITOT 0.5 08/12/2013 1533      ASSESSMENT & PLAN:  Chronic myelogenous leukemia Thankfully, the patient has no side effects with increased dose of chemotherapy. Repeat blood work showed he has no mutation and the BCR/ABL by PCR technique is improving. I will see him back in 3 months with history, physical examination, and blood work ahead of time.   Anemia in neoplastic disease This is likely due to recent treatment. The patient denies recent history of bleeding such as epistaxis, hematuria or hematochezia. He is asymptomatic from the anemia. I will observe for now.  He does not require transfusion now. I will continue the chemotherapy at current dose without dosage adjustment.  If the anemia gets progressive worse in the future, I might have to delay his treatment or adjust the chemotherapy dose.   No orders of the defined types were placed in this encounter.   All questions were answered. The patient knows to call the clinic with any problems, questions or concerns. No barriers to learning was detected. I spent 15 minutes counseling the patient face to face. The total time spent in the appointment was 20 minutes and more than 50% was on counseling and review of test results     [facility], [place], MD 05/06/2014 12:19 PM

## 2014-05-07 ENCOUNTER — Telehealth: Payer: Self-pay | Admitting: Hematology and Oncology

## 2014-05-07 ENCOUNTER — Ambulatory Visit (INDEPENDENT_AMBULATORY_CARE_PROVIDER_SITE_OTHER): Payer: Medicare HMO | Admitting: *Deleted

## 2014-05-07 DIAGNOSIS — Z23 Encounter for immunization: Secondary | ICD-10-CM

## 2014-05-07 NOTE — Telephone Encounter (Signed)
no answer...mailed pt appt sched/avs and letter °

## 2014-05-08 ENCOUNTER — Telehealth: Payer: Self-pay | Admitting: *Deleted

## 2014-05-08 DIAGNOSIS — J90 Pleural effusion, not elsewhere classified: Secondary | ICD-10-CM

## 2014-05-08 DIAGNOSIS — I214 Non-ST elevation (NSTEMI) myocardial infarction: Secondary | ICD-10-CM

## 2014-05-08 HISTORY — DX: Pleural effusion, not elsewhere classified: J90

## 2014-05-08 HISTORY — DX: Non-ST elevation (NSTEMI) myocardial infarction: I21.4

## 2014-05-08 NOTE — Telephone Encounter (Signed)
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

## 2014-05-08 NOTE — Telephone Encounter (Signed)
Informed pt his labs are good per Dr. Alvy Bimler.  He verbalized understanding.

## 2014-05-16 ENCOUNTER — Inpatient Hospital Stay (HOSPITAL_COMMUNITY): Payer: Medicare HMO

## 2014-05-16 ENCOUNTER — Encounter (HOSPITAL_COMMUNITY): Payer: Self-pay | Admitting: Emergency Medicine

## 2014-05-16 ENCOUNTER — Emergency Department (HOSPITAL_COMMUNITY): Payer: Medicare HMO

## 2014-05-16 ENCOUNTER — Inpatient Hospital Stay (HOSPITAL_COMMUNITY)
Admission: EM | Admit: 2014-05-16 | Discharge: 2014-05-23 | DRG: 224 | Disposition: A | Discharge status: Home / Self Care | Payer: Medicare HMO | Attending: Internal Medicine | Admitting: Internal Medicine

## 2014-05-16 DIAGNOSIS — Z811 Family history of alcohol abuse and dependence: Secondary | ICD-10-CM

## 2014-05-16 DIAGNOSIS — Z7902 Long term (current) use of antithrombotics/antiplatelets: Secondary | ICD-10-CM | POA: Diagnosis not present

## 2014-05-16 DIAGNOSIS — I959 Hypotension, unspecified: Secondary | ICD-10-CM | POA: Diagnosis present

## 2014-05-16 DIAGNOSIS — C9210 Chronic myeloid leukemia, BCR/ABL-positive, not having achieved remission: Secondary | ICD-10-CM | POA: Diagnosis present

## 2014-05-16 DIAGNOSIS — I214 Non-ST elevation (NSTEMI) myocardial infarction: Secondary | ICD-10-CM | POA: Diagnosis present

## 2014-05-16 DIAGNOSIS — Z8673 Personal history of transient ischemic attack (TIA), and cerebral infarction without residual deficits: Secondary | ICD-10-CM | POA: Diagnosis not present

## 2014-05-16 DIAGNOSIS — I2510 Atherosclerotic heart disease of native coronary artery without angina pectoris: Secondary | ICD-10-CM

## 2014-05-16 DIAGNOSIS — R55 Syncope and collapse: Secondary | ICD-10-CM | POA: Diagnosis present

## 2014-05-16 DIAGNOSIS — Z951 Presence of aortocoronary bypass graft: Secondary | ICD-10-CM

## 2014-05-16 DIAGNOSIS — E785 Hyperlipidemia, unspecified: Secondary | ICD-10-CM | POA: Diagnosis present

## 2014-05-16 DIAGNOSIS — Z8249 Family history of ischemic heart disease and other diseases of the circulatory system: Secondary | ICD-10-CM

## 2014-05-16 DIAGNOSIS — R002 Palpitations: Secondary | ICD-10-CM | POA: Diagnosis present

## 2014-05-16 DIAGNOSIS — I472 Ventricular tachycardia, unspecified: Secondary | ICD-10-CM

## 2014-05-16 DIAGNOSIS — Z7982 Long term (current) use of aspirin: Secondary | ICD-10-CM | POA: Diagnosis not present

## 2014-05-16 DIAGNOSIS — Z888 Allergy status to other drugs, medicaments and biological substances status: Secondary | ICD-10-CM

## 2014-05-16 DIAGNOSIS — C9211 Chronic myeloid leukemia, BCR/ABL-positive, in remission: Secondary | ICD-10-CM | POA: Diagnosis present

## 2014-05-16 DIAGNOSIS — Z79899 Other long term (current) drug therapy: Secondary | ICD-10-CM

## 2014-05-16 DIAGNOSIS — I25810 Atherosclerosis of coronary artery bypass graft(s) without angina pectoris: Secondary | ICD-10-CM | POA: Diagnosis present

## 2014-05-16 DIAGNOSIS — I471 Supraventricular tachycardia: Secondary | ICD-10-CM

## 2014-05-16 DIAGNOSIS — N179 Acute kidney failure, unspecified: Secondary | ICD-10-CM

## 2014-05-16 DIAGNOSIS — Z882 Allergy status to sulfonamides status: Secondary | ICD-10-CM | POA: Diagnosis not present

## 2014-05-16 DIAGNOSIS — I255 Ischemic cardiomyopathy: Secondary | ICD-10-CM | POA: Diagnosis present

## 2014-05-16 DIAGNOSIS — J189 Pneumonia, unspecified organism: Secondary | ICD-10-CM | POA: Diagnosis present

## 2014-05-16 DIAGNOSIS — Z87891 Personal history of nicotine dependence: Secondary | ICD-10-CM

## 2014-05-16 HISTORY — DX: Pneumonia, unspecified organism: J18.9

## 2014-05-16 LAB — CREATININE, SERUM
Creatinine, Ser: 1.17 mg/dL (ref 0.50–1.35)
GFR calc non Af Amer: 61 mL/min — ABNORMAL LOW (ref >90)
GFR, EST AFRICAN AMERICAN: 71 mL/min — AB (ref >90)

## 2014-05-16 LAB — CBC WITH DIFFERENTIAL/PLATELET
BASOS ABS: 0 10*3/uL (ref 0.0–0.1)
BASOS PCT: 0 % (ref 0–1)
Eosinophils Absolute: 0 10*3/uL (ref 0.0–0.7)
Eosinophils Relative: 0 % (ref 0–5)
HEMATOCRIT: 35.1 % — AB (ref 39.0–52.0)
Hemoglobin: 11.5 g/dL — ABNORMAL LOW (ref 13.0–17.0)
Lymphocytes Relative: 8 % — ABNORMAL LOW (ref 12–46)
Lymphs Abs: 0.8 10*3/uL (ref 0.7–4.0)
MCH: 31.6 pg (ref 26.0–34.0)
MCHC: 32.8 g/dL (ref 30.0–36.0)
MCV: 96.4 fL (ref 78.0–100.0)
MONO ABS: 0.9 10*3/uL (ref 0.1–1.0)
Monocytes Relative: 9 % (ref 3–12)
Neutro Abs: 8.3 10*3/uL — ABNORMAL HIGH (ref 1.7–7.7)
Neutrophils Relative %: 83 % — ABNORMAL HIGH (ref 43–77)
Platelets: 185 10*3/uL (ref 150–400)
RBC: 3.64 MIL/uL — ABNORMAL LOW (ref 4.22–5.81)
RDW: 14.6 % (ref 11.5–15.5)
WBC: 10.2 10*3/uL (ref 4.0–10.5)

## 2014-05-16 LAB — I-STAT TROPONIN, ED: Troponin i, poc: 0.19 ng/mL (ref 0.00–0.08)

## 2014-05-16 LAB — BASIC METABOLIC PANEL
ANION GAP: 15 (ref 5–15)
BUN: 13 mg/dL (ref 6–23)
CALCIUM: 8.4 mg/dL (ref 8.4–10.5)
CO2: 22 mEq/L (ref 19–32)
Chloride: 107 mEq/L (ref 96–112)
Creatinine, Ser: 1.22 mg/dL (ref 0.50–1.35)
GFR calc Af Amer: 68 mL/min — ABNORMAL LOW (ref >90)
GFR calc non Af Amer: 58 mL/min — ABNORMAL LOW (ref >90)
Glucose, Bld: 169 mg/dL — ABNORMAL HIGH (ref 70–99)
Potassium: 3.4 mEq/L — ABNORMAL LOW (ref 3.7–5.3)
Sodium: 144 mEq/L (ref 137–147)

## 2014-05-16 LAB — CBC
HCT: 35.9 % — ABNORMAL LOW (ref 39.0–52.0)
Hemoglobin: 11.7 g/dL — ABNORMAL LOW (ref 13.0–17.0)
MCH: 31.9 pg (ref 26.0–34.0)
MCHC: 32.6 g/dL (ref 30.0–36.0)
MCV: 97.8 fL (ref 78.0–100.0)
PLATELETS: 179 10*3/uL (ref 150–400)
RBC: 3.67 MIL/uL — ABNORMAL LOW (ref 4.22–5.81)
RDW: 14.6 % (ref 11.5–15.5)
WBC: 10.2 10*3/uL (ref 4.0–10.5)

## 2014-05-16 LAB — MAGNESIUM: Magnesium: 2.1 mg/dL (ref 1.5–2.5)

## 2014-05-16 MED ORDER — SODIUM CHLORIDE 0.9 % IJ SOLN
3.0000 mL | Freq: Two times a day (BID) | INTRAMUSCULAR | Status: DC
  Administered 2014-05-21 – 2014-05-22 (×3): 3 mL via INTRAVENOUS
  Filled 2014-05-16: qty 3

## 2014-05-16 MED ORDER — HEPARIN BOLUS VIA INFUSION
4000.0000 [IU] | Freq: Once | INTRAVENOUS | Status: AC
  Administered 2014-05-16: 4000 [IU] via INTRAVENOUS
  Filled 2014-05-16: qty 4000

## 2014-05-16 MED ORDER — HEPARIN (PORCINE) IN NACL 100-0.45 UNIT/ML-% IJ SOLN
1700.0000 [IU]/h | INTRAMUSCULAR | Status: DC
  Administered 2014-05-16: 1100 [IU]/h via INTRAVENOUS
  Administered 2014-05-17: 1300 [IU]/h via INTRAVENOUS
  Administered 2014-05-19: 1500 [IU]/h via INTRAVENOUS
  Filled 2014-05-16 (×8): qty 250

## 2014-05-16 MED ORDER — METOPROLOL TARTRATE 12.5 MG HALF TABLET
12.5000 mg | ORAL_TABLET | Freq: Two times a day (BID) | ORAL | Status: DC
  Administered 2014-05-16 – 2014-05-17 (×2): 12.5 mg via ORAL
  Filled 2014-05-16 (×3): qty 1

## 2014-05-16 MED ORDER — DASATINIB 100 MG PO TABS
100.0000 mg | ORAL_TABLET | Freq: Every day | ORAL | Status: DC
  Administered 2014-05-17 – 2014-05-23 (×7): 100 mg via ORAL
  Filled 2014-05-16 (×10): qty 1

## 2014-05-16 MED ORDER — DASATINIB 100 MG PO TABS: 100.0000 mg | ORAL_TABLET | Freq: Every day | ORAL | Status: DC

## 2014-05-16 MED ORDER — ATORVASTATIN CALCIUM 40 MG PO TABS
40.0000 mg | ORAL_TABLET | Freq: Every day | ORAL | Status: DC
  Filled 2014-05-16: qty 1

## 2014-05-16 MED ORDER — ASPIRIN 81 MG PO CHEW
81.0000 mg | CHEWABLE_TABLET | Freq: Every day | ORAL | Status: DC
  Administered 2014-05-17 – 2014-05-23 (×7): 81 mg via ORAL
  Filled 2014-05-16 (×7): qty 1

## 2014-05-16 MED ORDER — CLOPIDOGREL BISULFATE 75 MG PO TABS
75.0000 mg | ORAL_TABLET | Freq: Every day | ORAL | Status: DC
  Administered 2014-05-18 – 2014-05-23 (×6): 75 mg via ORAL
  Filled 2014-05-16 (×9): qty 1

## 2014-05-16 MED ORDER — HEPARIN SODIUM (PORCINE) 5000 UNIT/ML IJ SOLN: 5000.0000 [IU] | Freq: Three times a day (TID) | INTRAMUSCULAR | Status: DC

## 2014-05-16 MED ORDER — ZOLPIDEM TARTRATE 5 MG PO TABS: 5.0000 mg | ORAL_TABLET | Freq: Every evening | ORAL | Status: DC | PRN

## 2014-05-16 MED ORDER — POTASSIUM CHLORIDE ER 10 MEQ PO TBCR
40.0000 meq | EXTENDED_RELEASE_TABLET | Freq: Once | ORAL | Status: AC
  Administered 2014-05-16: 40 meq via ORAL
  Filled 2014-05-16: qty 4

## 2014-05-16 MED ORDER — CLOPIDOGREL BISULFATE 300 MG PO TABS
300.0000 mg | ORAL_TABLET | Freq: Every day | ORAL | Status: DC
  Administered 2014-05-17: 300 mg via ORAL
  Filled 2014-05-16 (×2): qty 1

## 2014-05-16 NOTE — H&P (Addendum)
Admission History and Physical     Patient ID: Anthony Wolfe, MRN: [id number], DOB: 12-01-1945 68 y.o. Date of Encounter: 05/16/2014, 9:16 PM  Primary Physician: Odette Fraction, MD Primary Cardiologist: Radford Pax  Chief Complaint:  VT, near syncope  History of Present Illness: Anthony Wolfe is a 68 y.o. male s/p 3v CABG in 2010, last echo in 2014 with EF 50% without regional WMA, who presents after episode of prolonged VT.  He was setting up to go [employer] today when he suddenly felt weak, nauseous, warm, and had to sit down several times.  Finally called for help, and when EMS arrived he was round to be in VT.  Rate was 235 on strip that I see from the field, but he did not lose consiousness.  Amio bolus given without effect and he was hypotensive so was DC cardioverted successfully in the ambulance.  He denies CP prior to procedure or now.  He feels ok now, no nausea, SOB.  Still with frequent PVCs on telemetry.  Vitals stable in ED. Trop-I positive.    Past Medical History  Diagnosis Date  . Lymphocytosis   . Hyperlipidemia   . HTN (hypertension)   . Allergic rhinitis   . Prostate hypertrophy   . Anxiety   . CAD (coronary artery disease)     ASCVD, borderline left main and single-vessel Occluded RCA, 40% left main, catheterization 11/2002 [name])  . TIA (transient ischemic attack)     ASPVD, S./P. right CEA, 2002, and known occluded left carotid  . 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 Ischemic cardiomyopathy     EF 36% now resolved  . Carotid disease, bilateral     right CEA, known occluded left carotid  . Depression   . Arrhythmia     PVC's with Holter showing PVC load of 22%  . Muscle spasm 05/28/2013  . CML in remission 07/29/2013  . Anemia in neoplastic disease 09/25/2013     Past Surgical History  Procedure Laterality Date  . Coronary artery bypass graft  02/2009    3 vessel  . Colonoscopy  07/2010  . Carotidectomy  2003    right side  . Back  surgery        Current Facility-Administered Medications  Medication Dose Route Frequency Provider Last Rate Last Dose  . aspirin chewable tablet 81 mg  81 mg Oral Daily Stephani Police, MD      . Derrill Memo ON 05/17/2014] atorvastatin (LIPITOR) tablet 40 mg  40 mg Oral q1800 Stephani Police, MD      . clopidogrel (PLAVIX) tablet 300 mg  300 mg Oral Daily Stephani Police, MD      . Derrill Memo ON 05/17/2014] clopidogrel (PLAVIX) tablet 75 mg  75 mg Oral Daily Stephani Police, MD      . Derrill Memo ON 05/17/2014] dasatinib (SPRYCEL) tablet 100 mg  100 mg Oral Daily Stephani Police, MD      . metoprolol tartrate (LOPRESSOR) tablet 12.5 mg  12.5 mg Oral BID Stephani Police, MD      . sodium chloride 0.9 % injection 3 mL  3 mL Intravenous Q12H Stephani Police, MD      . zolpidem (AMBIEN) tablet 5 mg  5 mg Oral QHS PRN Stephani Police, MD       Current Outpatient Prescriptions  Medication Sig Dispense Refill  . aspirin 325 MG tablet Take 325 mg by mouth daily.      . dasatinib (SPRYCEL) 100 MG tablet Take 100 mg by mouth  daily.      . guaiFENesin (MUCINEX) 600 MG 12 hr tablet Take 600 mg by mouth 2 (two) times daily as needed for cough or to loosen phlegm.      . sildenafil (VIAGRA) 100 MG tablet Take 100 mg by mouth daily as needed for erectile dysfunction.      . simvastatin (ZOCOR) 40 MG tablet Take 40 mg by mouth daily.      . verapamil (CALAN-SR) 180 MG CR tablet Take 180 mg by mouth at bedtime.          Allergies: Allergies  Allergen Reactions  . Norvasc [Amlodipine Besylate]     rash  . Sulfa Antibiotics Hives     Social History:  The patient  reports that he quit smoking about 16 years ago. He has quit using smokeless tobacco. He reports that he does not drink alcohol or use illicit drugs.   Family History:  The patient's family history includes Alcohol abuse in his father; Heart disease in his mother.   ROS:  Please see the history of present illness.   All other systems reviewed and negative.   Vital  Signs: Blood pressure 145/73, pulse 78, temperature 98.3 F (36.8 C), resp. rate 18, height 5\' 9"  (1.753 m), weight 89.812 kg (198 lb), SpO2 99.00%.  PHYSICAL EXAM: General:  Well nourished, well developed, in no acute distress HEENT: normal Lymph: no adenopathy Neck: no JVD Endocrine:  No thryomegaly Cardiac:  normal S1, S2; RRR; no murmur, s/p median sternotomy Lungs:  clear to auscultation bilaterally, no wheezing, rhonchi or rales Abd: soft, nontender, no hepatomegaly Ext: trace pitting edema bilaterally LE. Musculoskeletal:  No deformities, BUE and BLE strength normal and equal Skin: warm and dry Neuro:  CNs 2-12 intact, no focal abnormalities noted Psych:  Normal affect   Defibrilator strip (18:58:46):  VT at rate of 235.  RBBB morphology with downgoing complexes inferiorly and apically.    Admission EKG: NSR with freq PVCs.  Inferior/lateral TW flattening/inversion.  QT mildly prolonged.   Labs:   Lab Results  Component Value Date   WBC 10.2 05/16/2014   HGB 11.5* 05/16/2014   HCT 35.1* 05/16/2014   MCV 96.4 05/16/2014   PLT 185 05/16/2014    Recent Labs Lab 05/16/14 1932  NA 144  K 3.4*  CL 107  CO2 22  BUN 13  CREATININE 1.22  CALCIUM 8.4  GLUCOSE 169*   No results found for this basename: CKTOTAL, CKMB, TROPONINI,  in the last 72 hours Lab Results  Component Value Date   CHOL 117 08/12/2013   HDL 27* 08/12/2013   LDLCALC 67 08/12/2013   TRIG 114 08/12/2013   No results found for this basename: DDIMER    Radiology/Studies:  Dg Chest Port 1 View  05/16/2014   CLINICAL DATA:  Supraventricular tachycardia  EXAM: PORTABLE CHEST - 1 VIEW  COMPARISON:  04/03/2009  FINDINGS: Borderline cardiomegaly. No acute infiltrate or pleural effusion. No pulmonary edema. Status post CABG.  IMPRESSION: Borderline cardiomegaly.  No active disease.   Electronically Signed   By: Lahoma Crocker M.D.   On: 05/16/2014 20:00     ASSESSMENT AND PLAN:   1. Ventricular Tachycardia with  near-syncope - Change verapamil to metoprolol, titrate up as tolerated. - If recurrent VT will start lidocaine as likely ischemic in etology - EP consult for secondary prevention ICD - Echo ordered. - replete k, check mg.  2. NSTEMI - likely secondary to poor perfusion during VT event,  but primary ischemic event also possible.  As such will start heparin gtt and add plavix to aspirin. - Trend cardiac markers. - echo as above.   Continue home chemotherapy med (has been on for 2 years).  Stepdown for tonight, then to telemetry if remains stable.   Signed,  Stephani Police, MD 05/16/2014, 9:16 PM    Addendum: Repeat troponin 11.  Had some more nausea and vomiting overnight but no chest pressure. Comfortable in bed now.  Follow up EKG at midnight is similar to initial EKG, however, now with inferior Q waves (III and avF, previously just III).  <0.63mm ST elevation inferiorly with inverted T waves (chronic). He is on heparin gtt, did not get plavix yet due to nausea but will give now.  No more VT.  Discussed case with Dr. Fletcher Anon, we will hold off on emergent cath for now, continue close monitoring.  Given that it now seems to be a primary ACS event, will need coronary angiography prior to consideration of ICD.

## 2014-05-16 NOTE — ED Notes (Signed)
Portable chest x-ray

## 2014-05-16 NOTE — ED Notes (Signed)
Cardiologist in the room. 

## 2014-05-16 NOTE — ED Notes (Signed)
Med given 

## 2014-05-16 NOTE — ED Notes (Signed)
familty at the bedside

## 2014-05-16 NOTE — ED Notes (Signed)
The pr reports that 3 weeks ago he was working outside when he felt like he was getting sick.  He vomited x 3 and was sweating.  After he sat there for 15 minutes he was feeling better.

## 2014-05-16 NOTE — Progress Notes (Signed)
ANTICOAGULATION CONSULT NOTE - Initial Consult  Pharmacy Consult for heparin Indication: ACS/STEMI  Allergies  Allergen Reactions  . Norvasc [Amlodipine Besylate]     rash  . Sulfa Antibiotics Hives    Patient Measurements: Height: 5\' 9"  (175.3 cm) Weight: 198 lb (89.812 kg) IBW/kg (Calculated) : 70.7 Heparin Dosing Weight: 89 kg  Vital Signs: Temp: 98.3 F (36.8 C) (10/09 1936) BP: 134/63 mmHg (10/09 2123) Pulse Rate: 83 (10/09 2123)  Labs:  Recent Labs  05/16/14 1932  HGB 11.5*  HCT 35.1*  PLT 185  CREATININE 1.22    Estimated Creatinine Clearance: 62.4 ml/min (by C-G formula based on Cr of 1.22).   Medical History: Past Medical History  Diagnosis Date  . Lymphocytosis   . Hyperlipidemia   . HTN (hypertension)   . Allergic rhinitis   . Prostate hypertrophy   . Anxiety   . CAD (coronary artery disease)     ASCVD, borderline left main and single-vessel Occluded RCA, 40% left main, catheterization 11/2002 [name])  . TIA (transient ischemic attack)     ASPVD, S./P. right CEA, 2002, and known occluded left carotid  . 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 Ischemic cardiomyopathy     EF 36% now resolved  . Carotid disease, bilateral     right CEA, known occluded left carotid  . Depression   . Arrhythmia     PVC's with Holter showing PVC load of 22%  . Muscle spasm 05/28/2013  . CML in remission 07/29/2013  . 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 NSTEMI likely secondary to poor perfusion during VT event. POC troponin 0.19.   Goal of Therapy:  Goal heparin level of 0.3 - 0.7 Monitor platelets per anticoagulation protocol: yes  Plan:  Heparin IV 4000u bolus infusion then heparin IV 1000 unit/hr Monitor 8 hr heparin level, CBC, daily heparin level  Carl Best 05/16/2014,9:32 PM

## 2014-05-16 NOTE — ED Notes (Signed)
The pt is having  Multi-focal pvcs in pairs

## 2014-05-16 NOTE — ED Notes (Signed)
thge pt has no chest pain.  He has multiple focal pvs

## 2014-05-16 NOTE — ED Notes (Signed)
The pt arrived by gems from home.  He was out in a field   When his heart started racing sob no chest pain vomited onbce.  When ems arrived he was in vf.  He was cardiioverted  At 100.  Prior to that he had amiadarone 150mg  x 2 iv   Then cardioverted.  .  On arrival alert talking .  He vomited x 1 enroute.    diaphorectic initially.  Skin warm and dry now

## 2014-05-16 NOTE — ED Provider Notes (Signed)
CSN: [id number]     Arrival date & time 05/16/14  1927 History   First MD Initiated Contact with Patient 05/16/14 1930     Chief Complaint  Patient presents with  . Irregular Heart Beat   Patient is a 68 y.o. male presenting with palpitations. The history is provided by the patient and the EMS personnel.  Palpitations Palpitations quality:  Fast Onset quality:  Sudden Timing:  Constant Progression:  Resolved Chronicity:  New Context comment:  Walking to deer stand Relieved by: cardioversion. Ineffective treatments: amiodarone. Associated symptoms: diaphoresis   Associated symptoms: no back pain, no chest pain, no cough, no nausea, no shortness of breath and no vomiting   Risk factors: heart disease    68 year old male with a history of hypertension, coronary disease status post CABG who presents by EMS after Monomorphic VT with amio x 2, then 1 shock, then converted to sinus rhythm. Chest pain free. Walking to deer stand but did not climb. Hypotension with BP 12X systolic. Improved now. CABG in 2009.   Past Medical History  Diagnosis Date  . Lymphocytosis   . Hyperlipidemia   . HTN (hypertension)   . Allergic rhinitis   . Prostate hypertrophy   . Anxiety   . CAD (coronary artery disease)     ASCVD, borderline left main and single-vessel Occluded RCA, 40% left main, catheterization 11/2002 [name])  . TIA (transient ischemic attack)     ASPVD, S./P. right CEA, 2002, and known occluded left carotid  . 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 Ischemic cardiomyopathy     EF 36% now resolved  . Carotid disease, bilateral     right CEA, known occluded left carotid  . Depression   . Arrhythmia     PVC's with Holter showing PVC load of 22%  . Muscle spasm 05/28/2013  . CML in remission 07/29/2013  . 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 Coronary artery bypass graft  02/2009    3 vessel  . Colonoscopy  07/2010  .  Carotidectomy  2003    right side  . Back surgery     Family History  Problem Relation Age of Onset  . Heart disease Mother   . Alcohol abuse Father    History  Substance Use Topics  . Smoking status: Former Smoker    Quit date: 08/08/1997  . Smokeless tobacco: Former Systems developer  . Alcohol Use: No    Review of Systems  Constitutional: Positive for diaphoresis. Negative for fever and chills.  HENT: Negative for rhinorrhea and sore throat.   Eyes: Negative for visual disturbance.  Respiratory: Negative for cough and shortness of breath.   Cardiovascular: Positive for palpitations. Negative for chest pain.  Gastrointestinal: Negative for nausea, vomiting, abdominal pain, diarrhea and constipation.  Genitourinary: Negative for dysuria and hematuria.  Musculoskeletal: Negative for back pain and neck pain.  Skin: Negative for rash.  Neurological: Negative for syncope and headaches.  Psychiatric/Behavioral: Negative for confusion.  All other systems reviewed and are negative.  Allergies  Norvasc and Sulfa antibiotics  Home Medications   Prior to Admission medications   Medication Sig Start Date End Date Taking? Authorizing Provider  aspirin 325 MG tablet Take 325 mg by mouth daily.   Yes Historical Provider, MD  dasatinib (SPRYCEL) 100 MG tablet Take 100 mg by mouth daily.   Yes Historical Provider, MD  guaiFENesin (MUCINEX) 600 MG 12 hr tablet Take 600 mg by  mouth 2 (two) times daily as needed for cough or to loosen phlegm.   Yes Historical Provider, MD  sildenafil (VIAGRA) 100 MG tablet Take 100 mg by mouth daily as needed for erectile dysfunction.   Yes Historical Provider, MD  simvastatin (ZOCOR) 40 MG tablet Take 40 mg by mouth daily.   Yes Historical Provider, MD  verapamil (CALAN-SR) 180 MG CR tablet Take 180 mg by mouth at bedtime.   Yes Historical Provider, MD   BP 134/63  Pulse 83  Temp(Src) 98.3 F (36.8 C)  Resp 18  Ht 5\' 9"  (1.753 m)  Wt 198 lb (89.812 kg)  BMI  29.23 kg/m2  SpO2 99% Physical Exam  Constitutional: He is oriented to person, place, and time. He appears well-developed and well-nourished. No distress.  HENT:  Head: Normocephalic and atraumatic.  Mouth/Throat: Oropharynx is clear and moist.  Eyes: EOM are normal.  Neck: Neck supple. No JVD present.  Cardiovascular: Normal rate, regular rhythm, normal heart sounds and intact distal pulses.   Pulmonary/Chest: Effort normal and breath sounds normal.  Healed sternotomy scar  Abdominal: Soft. He exhibits no distension. There is no tenderness.  Musculoskeletal: Normal range of motion. He exhibits no edema.  Neurological: He is alert and oriented to person, place, and time. No cranial nerve deficit.  Skin: Skin is warm. He is diaphoretic.  Psychiatric: His behavior is normal.    ED Course  Procedures  None Labs Review Labs Reviewed  CBC WITH DIFFERENTIAL - Abnormal; Notable for the following:    RBC 3.64 (*)    Hemoglobin 11.5 (*)    HCT 35.1 (*)    Neutrophils Relative % 83 (*)    Neutro Abs 8.3 (*)    Lymphocytes Relative 8 (*)    All other components within normal limits  BASIC METABOLIC PANEL - Abnormal; Notable for the following:    Potassium 3.4 (*)    Glucose, Bld 169 (*)    GFR calc non Af Amer 58 (*)    GFR calc Af Amer 68 (*)    All other components within normal limits  I-STAT TROPOININ, ED - Abnormal; Notable for the following:    Troponin i, poc 0.19 (*)    All other components within normal limits  CBC  CREATININE, SERUM  BASIC METABOLIC PANEL  TROPONIN I  MAGNESIUM  HEPARIN LEVEL (UNFRACTIONATED)  CBC    Imaging Review Dg Chest Port 1 View  05/16/2014   CLINICAL DATA:  Supraventricular tachycardia  EXAM: PORTABLE CHEST - 1 VIEW  COMPARISON:  04/03/2009  FINDINGS: Borderline cardiomegaly. No acute infiltrate or pleural effusion. No pulmonary edema. Status post CABG.  IMPRESSION: Borderline cardiomegaly.  No active disease.   Electronically Signed   By:  Lahoma Crocker M.D.   On: 05/16/2014 20:00   MDM   Final diagnoses:  SVT (supraventricular tachycardia)    68 year old Caucasian male presents after monomorphic V. tach status post cardioversion for hypertension.. She appears diaphoretic. Patient's in sinus rhythm in the ED and asymptomatic. He is currently hemodynamically stable. Troponin 0.19. Chest x-ray unremarkable. EKG demonstrates normal sinus rhythm, rate 78, PVCs, no ST segment elevation or depression. QTC is prolonged at 543. PR is 226. Cardiology consult did and will admit the patient.  They prefer transfer to the floor.  Case discussed with Dr. Audie Pinto.    Gustavus Bryant, MD 05/16/14 2329

## 2014-05-16 NOTE — Progress Notes (Signed)
Discussed and agree with assessment and plan as above. Will increase ordered heparin dose slightly to 1100 units/hr in order to provide more accurate ACS dosing.  Heparin bolus, gtt and labs ordered.  Ramel Tobon D. Evely Gainey, PharmD, BCPS Clinical Pharmacist Pager: [phone] 05/16/2014 9:42 PM

## 2014-05-17 DIAGNOSIS — I472 Ventricular tachycardia: Principal | ICD-10-CM

## 2014-05-17 DIAGNOSIS — I379 Nonrheumatic pulmonary valve disorder, unspecified: Secondary | ICD-10-CM

## 2014-05-17 DIAGNOSIS — I214 Non-ST elevation (NSTEMI) myocardial infarction: Secondary | ICD-10-CM

## 2014-05-17 LAB — BASIC METABOLIC PANEL
ANION GAP: 15 (ref 5–15)
BUN: 15 mg/dL (ref 6–23)
CALCIUM: 8.9 mg/dL (ref 8.4–10.5)
CHLORIDE: 109 meq/L (ref 96–112)
CO2: 22 meq/L (ref 19–32)
CREATININE: 1.14 mg/dL (ref 0.50–1.35)
GFR calc Af Amer: 73 mL/min — ABNORMAL LOW (ref >90)
GFR calc non Af Amer: 63 mL/min — ABNORMAL LOW (ref >90)
Glucose, Bld: 113 mg/dL — ABNORMAL HIGH (ref 70–99)
Potassium: 4 mEq/L (ref 3.7–5.3)
Sodium: 146 mEq/L (ref 137–147)

## 2014-05-17 LAB — HEPARIN LEVEL (UNFRACTIONATED)
HEPARIN UNFRACTIONATED: 0.12 [IU]/mL — AB (ref 0.30–0.70)
HEPARIN UNFRACTIONATED: 0.52 [IU]/mL (ref 0.30–0.70)

## 2014-05-17 LAB — CBC
HEMATOCRIT: 35.5 % — AB (ref 39.0–52.0)
HEMOGLOBIN: 11.8 g/dL — AB (ref 13.0–17.0)
MCH: 31.7 pg (ref 26.0–34.0)
MCHC: 33.2 g/dL (ref 30.0–36.0)
MCV: 95.4 fL (ref 78.0–100.0)
Platelets: 165 10*3/uL (ref 150–400)
RBC: 3.72 MIL/uL — ABNORMAL LOW (ref 4.22–5.81)
RDW: 14.7 % (ref 11.5–15.5)
WBC: 11.1 10*3/uL — AB (ref 4.0–10.5)

## 2014-05-17 LAB — MRSA PCR SCREENING: MRSA by PCR: NEGATIVE

## 2014-05-17 LAB — TROPONIN I: Troponin I: 11.71 ng/mL (ref <0.30)

## 2014-05-17 MED ORDER — CARVEDILOL 6.25 MG PO TABS
6.2500 mg | ORAL_TABLET | Freq: Two times a day (BID) | ORAL | Status: DC
  Administered 2014-05-17 – 2014-05-20 (×6): 6.25 mg via ORAL
  Filled 2014-05-17 (×8): qty 1

## 2014-05-17 MED ORDER — ATORVASTATIN CALCIUM 80 MG PO TABS
80.0000 mg | ORAL_TABLET | Freq: Every day | ORAL | Status: DC
  Administered 2014-05-17 – 2014-05-22 (×6): 80 mg via ORAL
  Filled 2014-05-17 (×7): qty 1

## 2014-05-17 MED ORDER — LISINOPRIL 5 MG PO TABS
5.0000 mg | ORAL_TABLET | Freq: Every day | ORAL | Status: DC
  Administered 2014-05-17 – 2014-05-18 (×2): 5 mg via ORAL
  Filled 2014-05-17 (×2): qty 1

## 2014-05-17 MED ORDER — ONDANSETRON HCL 4 MG/2ML IJ SOLN
4.0000 mg | Freq: Once | INTRAMUSCULAR | Status: DC
  Filled 2014-05-17: qty 2

## 2014-05-17 MED ORDER — HEPARIN BOLUS VIA INFUSION
2000.0000 [IU] | Freq: Once | INTRAVENOUS | Status: AC
  Administered 2014-05-17: 2000 [IU] via INTRAVENOUS
  Filled 2014-05-17: qty 2000

## 2014-05-17 NOTE — Progress Notes (Addendum)
CRITICAL VALUE ALERT  Critical value received:  Troponin 11.71  Date of notification:  05/17/14  Time of notification:  2831  Critical value read back:Yes.    Nurse who received alert:  Dennison Mascot  MD notified (1st page): Dr. Kennith Center aware.

## 2014-05-17 NOTE — ED Provider Notes (Signed)
I saw and evaluated the patient, reviewed the resident's note and I agree with the findings and plan.   .Face to face Exam:  General:  Awake HEENT:  Atraumatic Resp:  Normal effort Abd:  Nondistended Neuro:No focal weakness Lymph: No adenopathy   EKG was discussed and reviewed with resident  CRITICAL CARE Performed by: Leonard Schwartz L Total critical care time: 30 min Critical care time was exclusive of separately billable procedures and treating other patients. Critical care was necessary to treat or prevent imminent or life-threatening deterioration. Critical care was time spent personally by me on the following activities: development of treatment plan with patient and/or surrogate as well as nursing, discussions with consultants, evaluation of patient's response to treatment, examination of patient, obtaining history from patient or surrogate, ordering and performing treatments and interventions, ordering and review of laboratory studies, ordering and review of radiographic studies, pulse oximetry and re-evaluation of patient's condition.   Dot Lanes, MD 05/17/14 [phone]

## 2014-05-17 NOTE — Progress Notes (Signed)
ANTICOAGULATION CONSULT NOTE - Follow Up Consult  Pharmacy Consult for heparin Indication: NSTEMI  Labs:  Recent Labs  05/16/14 1932 05/16/14 2201 05/17/14 0224  HGB 11.5* 11.7* 11.8*  HCT 35.1* 35.9* 35.5*  PLT 185 179 165  HEPARINUNFRC  --   --  0.12*  CREATININE 1.22 1.17 1.14  TROPONINI  --   --  11.71*    Assessment: 68yo male subtherapeutic on heparin with initial dosing for NSTEMI, lab drawn early.  Goal of Therapy:  Heparin level 0.3-0.7 units/ml   Plan:  Will rebolus with heparin 2000 units and increase gtt by 2-3 units/kg/hr to 1300 units/hr and check level in 6-8hr.  Wynona Neat, PharmD, BCPS  05/17/2014,4:14 AM

## 2014-05-17 NOTE — Progress Notes (Signed)
  Echocardiogram 2D Echocardiogram has been performed.  Anthony Wolfe 05/17/2014, 9:43 AM

## 2014-05-17 NOTE — Progress Notes (Signed)
Patient ID: Anthony Wolfe, male   DOB: 1946/05/30, 68 y.o.   MRN: [id number]   SUBJECTIVE: No further VT.  No chest pain.  He had some mild "indigestion"-like pain around the time of his VT episode.  He had some nausea last night.  This morning, feels fine.  TnI to 11.   Scheduled Meds: . aspirin  81 mg Oral Daily  . atorvastatin  40 mg Oral q1800  . carvedilol  6.25 mg Oral BID WC  . clopidogrel  300 mg Oral Daily  . clopidogrel  75 mg Oral Daily  . dasatinib  100 mg Oral Daily  . lisinopril  5 mg Oral Daily  . ondansetron (ZOFRAN) IV  4 mg Intravenous Once  . sodium chloride  3 mL Intravenous Q12H   Continuous Infusions: . heparin 1,300 Units/hr (05/17/14 0414)   PRN Meds:.zolpidem    Filed Vitals:   05/17/14 0300 05/17/14 0400 05/17/14 0402 05/17/14 0800  BP:  128/57 118/54 115/56  Pulse: 88 86 88 86  Temp: 99.7 F (37.6 C)  99.7 F (37.6 C) 99.3 F (37.4 C)  TempSrc: Oral  Oral Oral  Resp: 24 32 24 29  Height:      Weight:   190 lb 7.6 oz (86.4 kg)   SpO2:  97% 97% 94%    Intake/Output Summary (Last 24 hours) at 05/17/14 1137 Last data filed at 05/17/14 0800  Gross per 24 hour  Intake    467 ml  Output    351 ml  Net    116 ml    LABS: Basic Metabolic Panel:  Recent Labs  05/16/14 1932 05/16/14 2201 05/17/14 0224  NA 144  --  146  K 3.4*  --  4.0  CL 107  --  109  CO2 22  --  22  GLUCOSE 169*  --  113*  BUN 13  --  15  CREATININE 1.22 1.17 1.14  CALCIUM 8.4  --  8.9  MG  --  2.1  --    Liver Function Tests: No results found for this basename: AST, ALT, ALKPHOS, BILITOT, PROT, ALBUMIN,  in the last 72 hours No results found for this basename: LIPASE, AMYLASE,  in the last 72 hours CBC:  Recent Labs  05/16/14 1932 05/16/14 2201 05/17/14 0224  WBC 10.2 10.2 11.1*  NEUTROABS 8.3*  --   --   HGB 11.5* 11.7* 11.8*  HCT 35.1* 35.9* 35.5*  MCV 96.4 97.8 95.4  PLT 185 179 165   Cardiac Enzymes:  Recent Labs  05/17/14 0224  TROPONINI 11.71*    BNP: No components found with this basename: POCBNP,  D-Dimer: No results found for this basename: DDIMER,  in the last 72 hours Hemoglobin A1C: No results found for this basename: HGBA1C,  in the last 72 hours Fasting Lipid Panel: No results found for this basename: CHOL, HDL, LDLCALC, TRIG, CHOLHDL, LDLDIRECT,  in the last 72 hours Thyroid Function Tests: No results found for this basename: TSH, T4TOTAL, FREET3, T3FREE, THYROIDAB,  in the last 72 hours Anemia Panel: No results found for this basename: VITAMINB12, FOLATE, FERRITIN, TIBC, IRON, RETICCTPCT,  in the last 72 hours  RADIOLOGY: X-ray Chest Pa And Lateral  05/16/2014   CLINICAL DATA:  Acute onset of syncope.  Initial encounter.  EXAM: CHEST  2 VIEW  COMPARISON:  Chest radiograph performed earlier today at 7:49 p.m.  FINDINGS: The lungs are well expanded. Vascular congestion is noted, with bilateral central airspace opacities, concerning  for mild flash pulmonary edema. A small left pleural effusion is seen. No pneumothorax is seen.  The cardiomediastinal silhouette is borderline normal in size. The patient is status post median sternotomy. External pacing pads are noted. No acute osseous abnormalities are seen.  IMPRESSION: Vascular congestion noted, with bilateral central airspace opacities, new from the recent prior study, concerning for mild flash pulmonary edema. Small left pleural effusion noted.   Electronically Signed   By: Garald Balding M.D.   On: 05/16/2014 23:10   Dg Chest Port 1 View  05/16/2014   CLINICAL DATA:  Supraventricular tachycardia  EXAM: PORTABLE CHEST - 1 VIEW  COMPARISON:  04/03/2009  FINDINGS: Borderline cardiomegaly. No acute infiltrate or pleural effusion. No pulmonary edema. Status post CABG.  IMPRESSION: Borderline cardiomegaly.  No active disease.   Electronically Signed   By: Lahoma Crocker M.D.   On: 05/16/2014 20:00    PHYSICAL EXAM General: NAD Neck: JVP 8 cm, no thyromegaly or thyroid nodule.    Lungs: Clear to auscultation bilaterally with normal respiratory effort. CV: Nondisplaced PMI.  Heart regular S1/S2, no S3/S4, no murmur.  No peripheral edema.   Abdomen: Soft, nontender, no hepatosplenomegaly, no distention.  Neurologic: Alert and oriented x 3.  Psych: Normal affect. Extremities: No clubbing or cyanosis.   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   1. VT: Near-syncope.  Had DCCV by EMS.  No recurrent VT overnight.  Suspect this was due to ischemia and patient has evidence for NSTEMI.  Will plan on LHC and will decide on ICD after procedure: if no intervention, ICD for secondary prevention.   - Started beta blocker, Coreg.  2. CAD: s/p CABG.  Has not seen cardiologist for several years.  Only on ASA at home.  He had mild "indigestion" around the time of pre-syncope episode.  Currently, feels fine with no chest pain or dyspnea.  NSTEMI with TnI 11, do not think this is just demand ischemia. ECG with inferior Qs, very slight ST elevation.  - Will need left heart cath, defer to Monday if no further chest pain or events.   - Continue ASA, Plavix, heparin gtt, statin.  - On beta blocker, add low dose lisinopril.  - Echo today.  3. CML: This appears to be stable on chemotherapy (dasatinib).   Loralie Champagne 05/17/2014 11:42 AM

## 2014-05-17 NOTE — Progress Notes (Signed)
Pt had large amount of emesis x2. Dr. Kennith Center made aware. Stat EKG performed and new orders given for Zofran IV. Will continue to monitor closely.

## 2014-05-17 NOTE — Progress Notes (Signed)
[place] for heparin Indication: ACS  Allergies  Allergen Reactions  . Norvasc [Amlodipine Besylate]     rash  . Sulfa Antibiotics Hives    Patient Measurements: Height: 5\' 9"  (175.3 cm) Weight: 190 lb 7.6 oz (86.4 kg) IBW/kg (Calculated) : 70.7 Heparin Dosing Weight: 89 kg  Vital Signs: Temp: 98.4 F (36.9 C) (10/10 1200) Temp Source: Oral (10/10 0800) BP: 115/56 mmHg (10/10 0800) Pulse Rate: 86 (10/10 0800)  Labs:  Recent Labs  05/16/14 1932 05/16/14 2201 05/17/14 0224 05/17/14 1046  HGB 11.5* 11.7* 11.8*  --   HCT 35.1* 35.9* 35.5*  --   PLT 185 179 165  --   HEPARINUNFRC  --   --  0.12* 0.52  CREATININE 1.22 1.17 1.14  --   TROPONINI  --   --  11.71*  --     Estimated Creatinine Clearance: 65.7 ml/min (by C-G formula based on Cr of 1.14).   Medical History: Past Medical History  Diagnosis Date  . Lymphocytosis   . Hyperlipidemia   . HTN (hypertension)   . Allergic rhinitis   . Prostate hypertrophy   . Anxiety   . CAD (coronary artery disease)     ASCVD, borderline left main and single-vessel Occluded RCA, 40% left main, catheterization 11/2002 Anthony Wolfe)  . TIA (transient ischemic attack)     ASPVD, S./P. right CEA, 2002, and known occluded left carotid  . 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 Ischemic cardiomyopathy     EF 36% now resolved  . Carotid disease, bilateral     right CEA, known occluded left carotid  . Depression   . Arrhythmia     PVC's with Holter showing PVC load of 22%  . Muscle spasm 05/28/2013  . CML in remission 07/29/2013  . 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 NSTEMI likely secondary to poor perfusion during VT event. Troponin up to 11. No further VT noted overnight.   Continues on heparin with therapeutic level on 1300/hr. No bleeding or IV issues noted.  CBC stable  Goal  of Therapy:  Goal heparin level of 0.3 - 0.7 Monitor platelets per anticoagulation protocol: yes  Plan:  Continue heparin at 1300 units/hr Daily HL  Erin Hearing PharmD., BCPS Clinical Pharmacist Pager [phone] 05/17/2014 1:01 PM

## 2014-05-18 DIAGNOSIS — N179 Acute kidney failure, unspecified: Secondary | ICD-10-CM

## 2014-05-18 DIAGNOSIS — I255 Ischemic cardiomyopathy: Secondary | ICD-10-CM

## 2014-05-18 LAB — CBC
HEMATOCRIT: 31 % — AB (ref 39.0–52.0)
HEMOGLOBIN: 10.4 g/dL — AB (ref 13.0–17.0)
MCH: 31.9 pg (ref 26.0–34.0)
MCHC: 33.5 g/dL (ref 30.0–36.0)
MCV: 95.1 fL (ref 78.0–100.0)
Platelets: 140 10*3/uL — ABNORMAL LOW (ref 150–400)
RBC: 3.26 MIL/uL — AB (ref 4.22–5.81)
RDW: 15.2 % (ref 11.5–15.5)
WBC: 12.8 10*3/uL — ABNORMAL HIGH (ref 4.0–10.5)

## 2014-05-18 LAB — BASIC METABOLIC PANEL
Anion gap: 15 (ref 5–15)
BUN: 22 mg/dL (ref 6–23)
CO2: 20 mEq/L (ref 19–32)
Calcium: 8.3 mg/dL — ABNORMAL LOW (ref 8.4–10.5)
Chloride: 106 mEq/L (ref 96–112)
Creatinine, Ser: 1.45 mg/dL — ABNORMAL HIGH (ref 0.50–1.35)
GFR calc Af Amer: 55 mL/min — ABNORMAL LOW (ref >90)
GFR, EST NON AFRICAN AMERICAN: 47 mL/min — AB (ref >90)
GLUCOSE: 98 mg/dL (ref 70–99)
POTASSIUM: 3.9 meq/L (ref 3.7–5.3)
Sodium: 141 mEq/L (ref 137–147)

## 2014-05-18 LAB — HEPARIN LEVEL (UNFRACTIONATED)
HEPARIN UNFRACTIONATED: 0.23 [IU]/mL — AB (ref 0.30–0.70)
Heparin Unfractionated: 0.45 IU/mL (ref 0.30–0.70)

## 2014-05-18 LAB — GLUCOSE, CAPILLARY: Glucose-Capillary: 99 mg/dL (ref 70–99)

## 2014-05-18 MED ORDER — LISINOPRIL 2.5 MG PO TABS
2.5000 mg | ORAL_TABLET | Freq: Every day | ORAL | Status: DC
  Administered 2014-05-19 – 2014-05-22 (×4): 2.5 mg via ORAL
  Filled 2014-05-18 (×4): qty 1

## 2014-05-18 MED ORDER — SODIUM CHLORIDE 0.9 % IV SOLN
1.0000 mL/kg/h | INTRAVENOUS | Status: DC
  Administered 2014-05-19 (×2): 1 mL/kg/h via INTRAVENOUS

## 2014-05-18 MED ORDER — SODIUM CHLORIDE 0.9 % IJ SOLN
3.0000 mL | Freq: Two times a day (BID) | INTRAMUSCULAR | Status: DC
  Administered 2014-05-18 – 2014-05-19 (×2): 3 mL via INTRAVENOUS

## 2014-05-18 MED ORDER — SODIUM CHLORIDE 0.9 % IV SOLN: 250.0000 mL | INTRAVENOUS | Status: DC | PRN

## 2014-05-18 MED ORDER — ONDANSETRON HCL 4 MG/2ML IJ SOLN: 4.0000 mg | Freq: Four times a day (QID) | INTRAMUSCULAR | Status: DC | PRN

## 2014-05-18 MED ORDER — SODIUM CHLORIDE 0.9 % IJ SOLN: 3.0000 mL | INTRAMUSCULAR | Status: DC | PRN

## 2014-05-18 NOTE — Progress Notes (Signed)
Patient ID: Anthony Wolfe, male   DOB: 06-Dec-1945, 68 y.o.   MRN: [id number]   SUBJECTIVE: No further VT.  No chest pain.  He had some mild "indigestion"-like pain around the time of his VT episode.  Denies dyspnea but feels "worn out."   TnI to 11.  Echo (10/10) with EF 35-40%, global hypokinesis with inferior severe hypokinesis, mildly decreased RV systolic function.   Scheduled Meds: . aspirin  81 mg Oral Daily  . atorvastatin  80 mg Oral q1800  . carvedilol  6.25 mg Oral BID WC  . clopidogrel  75 mg Oral Daily  . dasatinib  100 mg Oral Daily  . [START ON 05/19/2014] lisinopril  2.5 mg Oral Daily  . ondansetron (ZOFRAN) IV  4 mg Intravenous Once  . sodium chloride  3 mL Intravenous Q12H   Continuous Infusions: . heparin 1,500 Units/hr (05/18/14 0408)   PRN Meds:.zolpidem    Filed Vitals:   05/18/14 0339 05/18/14 0400 05/18/14 0848 05/18/14 0855  BP:  93/36 112/51 112/51  Pulse:    81  Temp: 99.2 F (37.3 C)  98 F (36.7 C) 98 F (36.7 C)  TempSrc: Oral  Oral Oral  Resp:    20  Height:      Weight: 196 lb 6.9 oz (89.1 kg)     SpO2: 93%   90%    Intake/Output Summary (Last 24 hours) at 05/18/14 1049 Last data filed at 05/18/14 0900  Gross per 24 hour  Intake    984 ml  Output    650 ml  Net    334 ml    LABS: Basic Metabolic Panel:  Recent Labs  05/16/14 2201 05/17/14 0224 05/18/14 0310  NA  --  146 141  K  --  4.0 3.9  CL  --  109 106  CO2  --  22 20  GLUCOSE  --  113* 98  BUN  --  15 22  CREATININE 1.17 1.14 1.45*  CALCIUM  --  8.9 8.3*  MG 2.1  --   --    Liver Function Tests: No results found for this basename: AST, ALT, ALKPHOS, BILITOT, PROT, ALBUMIN,  in the last 72 hours No results found for this basename: LIPASE, AMYLASE,  in the last 72 hours CBC:  Recent Labs  05/16/14 1932  05/17/14 0224 05/18/14 0310  WBC 10.2  < > 11.1* 12.8*  NEUTROABS 8.3*  --   --   --   HGB 11.5*  < > 11.8* 10.4*  HCT 35.1*  < > 35.5* 31.0*  MCV 96.4  < >  95.4 95.1  PLT 185  < > 165 140*  < > = values in this interval not displayed. Cardiac Enzymes:  Recent Labs  05/17/14 0224  TROPONINI 11.71*   BNP: No components found with this basename: POCBNP,  D-Dimer: No results found for this basename: DDIMER,  in the last 72 hours Hemoglobin A1C: No results found for this basename: HGBA1C,  in the last 72 hours Fasting Lipid Panel: No results found for this basename: CHOL, HDL, LDLCALC, TRIG, CHOLHDL, LDLDIRECT,  in the last 72 hours Thyroid Function Tests: No results found for this basename: TSH, T4TOTAL, FREET3, T3FREE, THYROIDAB,  in the last 72 hours Anemia Panel: No results found for this basename: VITAMINB12, FOLATE, FERRITIN, TIBC, IRON, RETICCTPCT,  in the last 72 hours  RADIOLOGY: X-ray Chest Pa And Lateral  05/16/2014   CLINICAL DATA:  Acute onset of syncope.  Initial  encounter.  EXAM: CHEST  2 VIEW  COMPARISON:  Chest radiograph performed earlier today at 7:49 p.m.  FINDINGS: The lungs are well expanded. Vascular congestion is noted, with bilateral central airspace opacities, concerning for mild flash pulmonary edema. A small left pleural effusion is seen. No pneumothorax is seen.  The cardiomediastinal silhouette is borderline normal in size. The patient is status post median sternotomy. External pacing pads are noted. No acute osseous abnormalities are seen.  IMPRESSION: Vascular congestion noted, with bilateral central airspace opacities, new from the recent prior study, concerning for mild flash pulmonary edema. Small left pleural effusion noted.   Electronically Signed   By: Garald Balding M.D.   On: 05/16/2014 23:10   Dg Chest Port 1 View  05/16/2014   CLINICAL DATA:  Supraventricular tachycardia  EXAM: PORTABLE CHEST - 1 VIEW  COMPARISON:  04/03/2009  FINDINGS: Borderline cardiomegaly. No acute infiltrate or pleural effusion. No pulmonary edema. Status post CABG.  IMPRESSION: Borderline cardiomegaly.  No active disease.    Electronically Signed   By: Lahoma Crocker M.D.   On: 05/16/2014 20:00    PHYSICAL EXAM General: NAD Neck: JVP 8-9 cm, no thyromegaly or thyroid nodule.  Lungs: Clear to auscultation bilaterally with normal respiratory effort. CV: Nondisplaced PMI.  Heart regular S1/S2, no S3/S4, no murmur.  No peripheral edema.   Abdomen: Soft, nontender, no hepatosplenomegaly, no distention.  Neurologic: Alert and oriented x 3.  Psych: Normal affect. Extremities: No clubbing or cyanosis.   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   1. VT: Near-syncope.  Had DCCV by EMS.  No recurrent VT overnight.  Suspect this was due to ischemia and patient has evidence for NSTEMI.  Will plan on LHC and will decide on ICD after procedure: if no intervention, ICD for secondary prevention.   - Started beta blocker, Coreg.  2. CAD: s/p CABG.  Has not seen cardiologist for several years.  Only on ASA at home.  He had mild "indigestion" around the time of pre-syncope episode.  Currently, feels ok with no chest pain or dyspnea (just "worn out").  NSTEMI with TnI 11, do not think this is just demand ischemia. ECG with inferior Qs, very slight ST elevation.  - Will need left heart cath, plan tomorrow if no further chest pain or events.   - Continue ASA, Plavix, heparin gtt, statin.  - On beta blocker, lisinopril.  3. Ischemic cardiomyopathy: EF 35-40% on echo with mildly decreased RV systolic function.  Probably mild volume overload on exam.  - Continue Coreg, will decrease lisinopril to 2.5 with creatinine up to 1.4. - No diuresis, will have cath tomorrow.  4. Renal: Mild rise in creatinine from 1.1 => 1.4. Decrease lisinopril to 2.5 daily.  Gentle hydration prior to cath tomorrow.   5. CML: This appears to be stable on chemotherapy (dasatinib).   Loralie Champagne 05/18/2014 10:49 AM

## 2014-05-18 NOTE — Progress Notes (Signed)
[place] for heparin Indication: ACS  Allergies  Allergen Reactions  . Norvasc [Amlodipine Besylate]     rash  . Sulfa Antibiotics Hives    Patient Measurements: Height: 5\' 9"  (175.3 cm) Weight: 196 lb 6.9 oz (89.1 kg) IBW/kg (Calculated) : 70.7 Heparin Dosing Weight: 89 kg  Vital Signs: Temp: 98.4 F (36.9 C) (10/11 1102) Temp Source: Oral (10/11 1102) BP: 112/48 mmHg (10/11 1102) Pulse Rate: 76 (10/11 1102)  Labs:  Recent Labs  05/16/14 2201  05/17/14 0224 05/17/14 1046 05/18/14 0310 05/18/14 0955  HGB 11.7*  --  11.8*  --  10.4*  --   HCT 35.9*  --  35.5*  --  31.0*  --   PLT 179  --  165  --  140*  --   HEPARINUNFRC  --   < > 0.12* 0.52 0.23* 0.45  CREATININE 1.17  --  1.14  --  1.45*  --   TROPONINI  --   --  11.71*  --   --   --   < > = values in this interval not displayed.  Estimated Creatinine Clearance: 52.4 ml/min (by C-G formula based on Cr of 1.45).   Medical History: Past Medical History  Diagnosis Date  . Lymphocytosis   . Hyperlipidemia   . HTN (hypertension)   . Allergic rhinitis   . Prostate hypertrophy   . Anxiety   . CAD (coronary artery disease)     ASCVD, borderline left main and single-vessel Occluded RCA, 40% left main, catheterization 11/2002 [name])  . TIA (transient ischemic attack)     ASPVD, S./P. right CEA, 2002, and known occluded left carotid  . 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 Ischemic cardiomyopathy     EF 36% now resolved  . Carotid disease, bilateral     right CEA, known occluded left carotid  . Depression   . Arrhythmia     PVC's with Holter showing PVC load of 22%  . Muscle spasm 05/28/2013  . CML in remission 07/29/2013  . 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 NSTEMI likely secondary to poor perfusion during VT event. Troponin up to 11. No further VT noted  overnight.   Continues on heparin with therapeutic level on 1500/hr. No bleeding or IV issues noted.  CBC trending down.  Goal of Therapy:  Goal heparin level of 0.3 - 0.7 Monitor platelets per anticoagulation protocol: yes  Plan:  Continue heparin at 1500 units/hr Daily HL  Erin Hearing PharmD., BCPS Clinical Pharmacist Pager [phone] 05/18/2014 11:18 AM

## 2014-05-18 NOTE — Progress Notes (Signed)
Utilization Review Completed.   Hikaru Delorenzo, RN, BSN Nurse Case Manager  

## 2014-05-18 NOTE — Progress Notes (Signed)
ANTICOAGULATION CONSULT NOTE - Follow Up Consult  Pharmacy Consult for heparin Indication: NSTEMI  Labs:  Recent Labs  05/16/14 1932 05/16/14 2201 05/17/14 0224 05/17/14 1046 05/18/14 0310  HGB 11.5* 11.7* 11.8*  --  10.4*  HCT 35.1* 35.9* 35.5*  --  31.0*  PLT 185 179 165  --  140*  HEPARINUNFRC  --   --  0.12* 0.52 0.23*  CREATININE 1.22 1.17 1.14  --   --   TROPONINI  --   --  11.71*  --   --     Assessment: 68yo male now subtherapeutic on heparin after one level at goal, no gtt issue per RN.  Goal of Therapy:  Heparin level 0.3-0.7 units/ml   Plan:  Will increase heparin gtt by ~2 units/kg/hr to 1500 units/hr and check level in 6hr.  Wynona Neat, PharmD, BCPS  05/18/2014,4:09 AM

## 2014-05-18 NOTE — Progress Notes (Signed)
Notified Md about pt having periods of nausea.  New orders received. Will continue to monitor. Saunders Revel T

## 2014-05-19 ENCOUNTER — Encounter (HOSPITAL_COMMUNITY)
Admission: EM | Disposition: A | Discharge status: Home / Self Care | Payer: Self-pay | Source: Home / Self Care | Attending: Internal Medicine

## 2014-05-19 DIAGNOSIS — I2510 Atherosclerotic heart disease of native coronary artery without angina pectoris: Secondary | ICD-10-CM

## 2014-05-19 DIAGNOSIS — I471 Supraventricular tachycardia: Secondary | ICD-10-CM

## 2014-05-19 HISTORY — PX: LEFT HEART CATHETERIZATION WITH CORONARY/GRAFT ANGIOGRAM: SHX5450

## 2014-05-19 LAB — PROTIME-INR
INR: 1.39 (ref 0.00–1.49)
Prothrombin Time: 17.1 seconds — ABNORMAL HIGH (ref 11.6–15.2)

## 2014-05-19 LAB — CBC
HEMATOCRIT: 30.6 % — AB (ref 39.0–52.0)
Hemoglobin: 10.2 g/dL — ABNORMAL LOW (ref 13.0–17.0)
MCH: 31.8 pg (ref 26.0–34.0)
MCHC: 33.3 g/dL (ref 30.0–36.0)
MCV: 95.3 fL (ref 78.0–100.0)
Platelets: 137 10*3/uL — ABNORMAL LOW (ref 150–400)
RBC: 3.21 MIL/uL — AB (ref 4.22–5.81)
RDW: 15.3 % (ref 11.5–15.5)
WBC: 9.2 10*3/uL (ref 4.0–10.5)

## 2014-05-19 LAB — GLUCOSE, CAPILLARY
Glucose-Capillary: 103 mg/dL — ABNORMAL HIGH (ref 70–99)
Glucose-Capillary: 92 mg/dL (ref 70–99)

## 2014-05-19 LAB — BASIC METABOLIC PANEL
Anion gap: 14 (ref 5–15)
BUN: 21 mg/dL (ref 6–23)
CALCIUM: 8.4 mg/dL (ref 8.4–10.5)
CO2: 22 meq/L (ref 19–32)
CREATININE: 1.35 mg/dL (ref 0.50–1.35)
Chloride: 105 mEq/L (ref 96–112)
GFR calc Af Amer: 60 mL/min — ABNORMAL LOW (ref >90)
GFR calc non Af Amer: 52 mL/min — ABNORMAL LOW (ref >90)
Glucose, Bld: 89 mg/dL (ref 70–99)
Potassium: 3.8 mEq/L (ref 3.7–5.3)
Sodium: 141 mEq/L (ref 137–147)

## 2014-05-19 LAB — HEPARIN LEVEL (UNFRACTIONATED)
Heparin Unfractionated: 0.1 IU/mL — ABNORMAL LOW (ref 0.30–0.70)
Heparin Unfractionated: 0.31 IU/mL (ref 0.30–0.70)

## 2014-05-19 SURGERY — LEFT HEART CATHETERIZATION WITH CORONARY/GRAFT ANGIOGRAM
Anesthesia: LOCAL

## 2014-05-19 MED ORDER — LIDOCAINE HCL (PF) 1 % IJ SOLN
INTRAMUSCULAR | Status: AC
  Filled 2014-05-19: qty 30

## 2014-05-19 MED ORDER — ONDANSETRON HCL 4 MG/2ML IJ SOLN: 4.0000 mg | Freq: Four times a day (QID) | INTRAMUSCULAR | Status: DC | PRN

## 2014-05-19 MED ORDER — NITROGLYCERIN 1 MG/10 ML FOR IR/CATH LAB
INTRA_ARTERIAL | Status: AC
  Filled 2014-05-19: qty 10

## 2014-05-19 MED ORDER — MORPHINE SULFATE 2 MG/ML IJ SOLN: 2.0000 mg | INTRAMUSCULAR | Status: DC | PRN

## 2014-05-19 MED ORDER — SODIUM CHLORIDE 0.9 % IV SOLN: 250.0000 mL | INTRAVENOUS | Status: DC | PRN

## 2014-05-19 MED ORDER — ACETAMINOPHEN 325 MG PO TABS: 650.0000 mg | ORAL_TABLET | ORAL | Status: DC | PRN

## 2014-05-19 MED ORDER — HEPARIN (PORCINE) IN NACL 100-0.45 UNIT/ML-% IJ SOLN
1800.0000 [IU]/h | INTRAMUSCULAR | Status: DC
  Administered 2014-05-19: 1700 [IU]/h via INTRAVENOUS
  Administered 2014-05-20: 1800 [IU]/h via INTRAVENOUS
  Filled 2014-05-19 (×4): qty 250

## 2014-05-19 MED ORDER — MIDAZOLAM HCL 2 MG/2ML IJ SOLN
INTRAMUSCULAR | Status: AC
  Filled 2014-05-19: qty 2

## 2014-05-19 MED ORDER — HEPARIN (PORCINE) IN NACL 2-0.9 UNIT/ML-% IJ SOLN
INTRAMUSCULAR | Status: AC
  Filled 2014-05-19: qty 1500

## 2014-05-19 MED ORDER — FENTANYL CITRATE 0.05 MG/ML IJ SOLN
INTRAMUSCULAR | Status: AC
  Filled 2014-05-19: qty 2

## 2014-05-19 MED ORDER — SODIUM CHLORIDE 0.9 % IJ SOLN
3.0000 mL | Freq: Two times a day (BID) | INTRAMUSCULAR | Status: DC
  Administered 2014-05-21: 3 mL via INTRAVENOUS

## 2014-05-19 MED ORDER — DIAZEPAM 2 MG PO TABS: 2.0000 mg | ORAL_TABLET | Freq: Four times a day (QID) | ORAL | Status: DC | PRN

## 2014-05-19 MED ORDER — OXYCODONE-ACETAMINOPHEN 5-325 MG PO TABS: 1.0000 | ORAL_TABLET | ORAL | Status: DC | PRN

## 2014-05-19 MED ORDER — SODIUM CHLORIDE 0.9 % IJ SOLN: 3.0000 mL | INTRAMUSCULAR | Status: DC | PRN

## 2014-05-19 NOTE — Interval H&P Note (Signed)
History and Physical Interval Note:  05/19/2014 3:16 PM  Anthony Wolfe  has presented today for surgery, with the diagnosis of VT  The various methods of treatment have been discussed with the patient and family. After consideration of risks, benefits and other options for treatment, the patient has consented to  Procedure(s): LEFT HEART CATHETERIZATION WITH CORONARY/GRAFT ANGIOGRAM (N/A) as a surgical intervention .  The patient's history has been reviewed, patient examined, no change in status, stable for surgery.  I have reviewed the patient's chart and labs.  Questions were answered to the patient's satisfaction.     Jenkins Rouge

## 2014-05-19 NOTE — Progress Notes (Addendum)
ANTICOAGULATION CONSULT NOTE - Follow Up Consult  Pharmacy Consult for heparin Indication: NSTEMI  Labs:  Recent Labs  05/16/14 2201 05/17/14 0224  05/18/14 0310 05/18/14 0955 05/19/14 0227  HGB 11.7* 11.8*  --  10.4*  --  10.2*  HCT 35.9* 35.5*  --  31.0*  --  30.6*  PLT 179 165  --  140*  --  137*  HEPARINUNFRC  --  0.12*  < > 0.23* 0.45 0.10*  CREATININE 1.17 1.14  --  1.45*  --   --   TROPONINI  --  11.71*  --   --   --   --   < > = values in this interval not displayed.   Assessment: 68yo male now subtherapeutic on heparin after one level at goal, no gtt issue per RN.  Goal of Therapy:  Heparin level 0.3-0.7 units/ml   Plan:  Will increase heparin gtt by ~2 units/kg/hr to 1700 units/hr and check level in 6-8hr.  Wynona Neat, PharmD, BCPS  05/19/2014,4:24 AM  Addn: Pharmacy was consulted to restart heparin 4 hours after sheath removal. Sheath was removed at 1620 on 05/19/14. Pt was at goal heparin level previously on heparin 1700 units/hr. Will resume this rate at 2020. Will recheck a level with morning labs.  Andrey Cota. Diona Foley, PharmD Clinical Pharmacist Pager [phone]

## 2014-05-19 NOTE — Care Management Note (Addendum)
    Page 1 of 1   05/22/2014     10:34:49 AM CARE MANAGEMENT NOTE 05/22/2014  Patient:  Anthony Wolfe, Anthony Wolfe   Account Number:  [id number]  Date Initiated:  05/19/2014  Documentation initiated by:  Elissa Hefty  Subjective/Objective Assessment:   adm w v tach     Action/Plan:   lives w wife, pcp dr Cletus Gash pickard   Anticipated DC Date:     Anticipated DC Plan:           Choice offered to / List presented to:             Status of service:   Medicare Important Message given?  YES (If response is "NO", the following Medicare IM given date fields will be blank) Date Medicare IM given:  05/19/2014 Medicare IM given by:  Elissa Hefty Date Additional Medicare IM given:  05/22/2014 Additional Medicare IM given by:  [facility] [place]  Discharge Disposition:    Per UR Regulation:  Reviewed for med. necessity/level of care/duration of stay  If discussed at [place] of Stay Meetings, dates discussed:   05/22/2014    Comments:

## 2014-05-19 NOTE — CV Procedure (Signed)
       Catheterization   Indication:  IMI  Procedure: After informed consent and clinical "time out" the right groin was prepped and draped in a sterile fashion.  A 5Fr sheath was placed in the right femoral artery using seldinger technique and local lidocaine.  Standard JL4, JR4 and angled pigtail catheters were used to engage the coronary arteries.  Coronary arteries were visualized in orthogonal views using caudal and cranial angulation.  RAO ventriculography was done using 22* cc of contrast.    Medications:   Versed: 2 mg's  Fentanyl: 25 ug's  Coronary Arteries: Right dominant with no anomalies  LM:  Long segment 50% calcified mid vessel disease  LAD:  100% mid vessel occlusion distal vessel fills from LIMA    D1: 30% mid vessel disease large artery  D2  Small artery no focal stentois  Circumflex: Occluded proximally   OM1: Fills via SVG OM  OM2: Fills via SVG OM  RCA:  Subtotally occluded proximally with bridging collaterals   PDA: Seen from OM collaterals  PLA:  Seen from OM collaterals  Ventriculography: EF: 40 %,  No significant MR  Inferior wall hypokinesis   Hemodynamics:  Aortic Pressure: 132 54 mmHg  LV Pressure:  135 8  mmHg  Impression:  Recent IMI with occluded SVG to RCA  Troponin 11  Has Right to Right collaterals and collaterals from septal and OM  EF on V gram actually looks Closer to 40%  Medical Rx.  Will ask EP to see given VT.  Suspect he will have to wait for re evaluation of EF in 3 months to make decision about AICD.  Would be a candidate for life vest    Anthony Wolfe 05/19/2014 4:03 PM

## 2014-05-19 NOTE — Progress Notes (Signed)
Patient Name: Anthony Wolfe Date of Encounter: 05/19/2014     Principal Problem:   Ventricular tachycardia Active Problems:   S/P CABG x 3    SUBJECTIVE  No chest pain overnight. He complains of feeling exhausted. Telemetry shows  NSR with frequent PVCs and short 3 beat runs of VT. NPO awaiting cath this am.  CURRENT MEDS . aspirin  81 mg Oral Daily  . atorvastatin  80 mg Oral q1800  . carvedilol  6.25 mg Oral BID WC  . clopidogrel  75 mg Oral Daily  . dasatinib  100 mg Oral Daily  . lisinopril  2.5 mg Oral Daily  . ondansetron (ZOFRAN) IV  4 mg Intravenous Once  . sodium chloride  3 mL Intravenous Q12H  . sodium chloride  3 mL Intravenous Q12H    OBJECTIVE  Filed Vitals:   05/18/14 2337 05/18/14 2349 05/19/14 0326 05/19/14 0747  BP:  114/44 124/49 131/56  Pulse:      Temp: 99.2 F (37.3 C)  99.9 F (37.7 C) 98.6 F (37 C)  TempSrc: Oral  Oral Oral  Resp:  16 18 17   Height:      Weight:   193 lb 12.6 oz (87.9 kg)   SpO2: 93%  93% 92%    Intake/Output Summary (Last 24 hours) at 05/19/14 0814 Last data filed at 05/19/14 0700  Gross per 24 hour  Intake 2150.28 ml  Output    400 ml  Net 1750.28 ml   Filed Weights   05/17/14 0402 05/18/14 0339 05/19/14 0326  Weight: 190 lb 7.6 oz (86.4 kg) 196 lb 6.9 oz (89.1 kg) 193 lb 12.6 oz (87.9 kg)    PHYSICAL EXAM  General: Pleasant, NAD. Neuro: Alert and oriented X 3. Moves all extremities spontaneously. Psych: Normal affect. HEENT:  Normal  Neck: Supple without bruits or JVD. Lungs:  Resp regular and unlabored, CTA. Heart: RRR no s3, s4, or murmurs. Abdomen: Soft, non-tender, non-distended, BS + x 4.  Extremities: No clubbing, cyanosis or edema. DP/PT/Radials 2+ and equal bilaterally.  Accessory Clinical Findings  CBC  Recent Labs  05/16/14 1932  05/18/14 0310 05/19/14 0227  WBC 10.2  < > 12.8* 9.2  NEUTROABS 8.3*  --   --   --   HGB 11.5*  < > 10.4* 10.2*  HCT 35.1*  < > 31.0* 30.6*  MCV 96.4   < > 95.1 95.3  PLT 185  < > 140* 137*  < > = values in this interval not displayed. Basic Metabolic Panel  Recent Labs  05/16/14 2201  05/18/14 0310 05/19/14 0227  NA  --   < > 141 141  K  --   < > 3.9 3.8  CL  --   < > 106 105  CO2  --   < > 20 22  GLUCOSE  --   < > 98 89  BUN  --   < > 22 21  CREATININE 1.17  < > 1.45* 1.35  CALCIUM  --   < > 8.3* 8.4  MG 2.1  --   --   --   < > = values in this interval not displayed. Liver Function Tests No results found for this basename: AST, ALT, ALKPHOS, BILITOT, PROT, ALBUMIN,  in the last 72 hours No results found for this basename: LIPASE, AMYLASE,  in the last 72 hours Cardiac Enzymes  Recent Labs  05/17/14 0224  TROPONINI 11.71*   BNP No components found with  this basename: POCBNP,  D-Dimer No results found for this basename: DDIMER,  in the last 72 hours Hemoglobin A1C No results found for this basename: HGBA1C,  in the last 72 hours Fasting Lipid Panel No results found for this basename: CHOL, HDL, LDLCALC, TRIG, CHOLHDL, LDLDIRECT,  in the last 72 hours Thyroid Function Tests No results found for this basename: TSH, T4TOTAL, FREET3, T3FREE, THYROIDAB,  in the last 72 hours  TELE  NSR with frequent PVCs  ECG  Normal sinus rhythm Prolonged QT Abnormal ECG  2D echo: Study Conclusions  - Left ventricle: The cavity size was normal. Wall thickness was normal. Systolic function was moderately reduced. The estimated ejection fraction was in the range of 35% to 40%. Global mild hypokinesis with inferior severe hypokinesis. Features are consistent with a pseudonormal left ventricular filling pattern, with concomitant abnormal relaxation and increased filling pressure (grade 2 diastolic dysfunction). - Aortic valve: There was no stenosis. - Mitral valve: Mildly calcified annulus. Normal thickness leaflets . There was trivial regurgitation. - Left atrium: The atrium was moderately dilated. - Right ventricle: The  cavity size was normal. Systolic function was mildly reduced. - Right atrium: The atrium was mildly dilated. - Pulmonary arteries: No complete TR doppler jet so unable to estimate PA systolic pressure. - Inferior vena cava: The vessel was normal in size. The respirophasic diameter changes were in the normal range (>= 50%), consistent with normal central venous pressure.  Impressions:  - Normal LV size and systolic function, EF 50-27%. Global mild hypokinesis with severe inferior hypokinesis. Normal RV size with mildly decreased systolic function. No significant valvular abnormalities.  Radiology/Studies  X-ray Chest Pa And Lateral  05/16/2014   CLINICAL DATA:  Acute onset of syncope.  Initial encounter.  EXAM: CHEST  2 VIEW  COMPARISON:  Chest radiograph performed earlier today at 7:49 p.m.  FINDINGS: The lungs are well expanded. Vascular congestion is noted, with bilateral central airspace opacities, concerning for mild flash pulmonary edema. A small left pleural effusion is seen. No pneumothorax is seen.  The cardiomediastinal silhouette is borderline normal in size. The patient is status post median sternotomy. External pacing pads are noted. No acute osseous abnormalities are seen.  IMPRESSION: Vascular congestion noted, with bilateral central airspace opacities, new from the recent prior study, concerning for mild flash pulmonary edema. Small left pleural effusion noted.   Electronically Signed   By: Garald Balding M.D.   On: 05/16/2014 23:10   Dg Chest Port 1 View  05/16/2014   CLINICAL DATA:  Supraventricular tachycardia  EXAM: PORTABLE CHEST - 1 VIEW  COMPARISON:  04/03/2009  FINDINGS: Borderline cardiomegaly. No acute infiltrate or pleural effusion. No pulmonary edema. Status post CABG.  IMPRESSION: Borderline cardiomegaly.  No active disease.   Electronically Signed   By: Lahoma Crocker M.D.   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  1. VT: Near-syncope. Had DCCV by EMS. No recurrent VT overnight. Suspect this was due to ischemia and patient has evidence for NSTEMI. Will plan on LHC and will decide on ICD after procedure: if no intervention, ICD for secondary prevention.  - Started beta blocker, Coreg.  2. CAD: s/p CABG. Has not seen cardiologist for several years. Only on ASA at home. He had mild "indigestion" around the time of pre-syncope episode. Currently, feels fine with no chest pain or dyspnea. NSTEMI with TnI  11, do not think this is just demand ischemia. ECG with inferior Qs, very slight ST elevation.  - Continue ASA, Plavix, heparin gtt, statin.  - On beta blocker, low dose lisinopril.  - Echo on 05/17/14 reveals: EF 35-40 % with severe inferior wall hypokinesis.          3. CML: This appears to be stable on chemotherapy (dasatinib).   Plan: left heart cath today. If no intervention, consider EP consult re ICD placement.  Signed, Darlin Coco MD

## 2014-05-19 NOTE — Progress Notes (Signed)
Site area: right groin Site Prior to Removal:  Level 0 Pressure Applied For: 20 minutes by Suella Broad Manual:   yes Patient Status During Pull:  stable Post Pull Site:  Level 0 Post Pull Instructions Given:  yes Post Pull Pulses Present: yes and remained palpable with sheath removal Dressing Applied:  Tegaderm Bedrest begins @ 1625 Comments: No complications

## 2014-05-20 DIAGNOSIS — I2510 Atherosclerotic heart disease of native coronary artery without angina pectoris: Secondary | ICD-10-CM

## 2014-05-20 LAB — CBC
HCT: 30.4 % — ABNORMAL LOW (ref 39.0–52.0)
Hemoglobin: 10 g/dL — ABNORMAL LOW (ref 13.0–17.0)
MCH: 31.3 pg (ref 26.0–34.0)
MCHC: 32.9 g/dL (ref 30.0–36.0)
MCV: 95.3 fL (ref 78.0–100.0)
PLATELETS: 143 10*3/uL — AB (ref 150–400)
RBC: 3.19 MIL/uL — AB (ref 4.22–5.81)
RDW: 15 % (ref 11.5–15.5)
WBC: 7.4 10*3/uL (ref 4.0–10.5)

## 2014-05-20 LAB — BASIC METABOLIC PANEL
ANION GAP: 15 (ref 5–15)
BUN: 16 mg/dL (ref 6–23)
CALCIUM: 8.1 mg/dL — AB (ref 8.4–10.5)
CHLORIDE: 107 meq/L (ref 96–112)
CO2: 19 mEq/L (ref 19–32)
Creatinine, Ser: 1.02 mg/dL (ref 0.50–1.35)
GFR calc non Af Amer: 72 mL/min — ABNORMAL LOW (ref >90)
GFR, EST AFRICAN AMERICAN: 84 mL/min — AB (ref >90)
Glucose, Bld: 90 mg/dL (ref 70–99)
Potassium: 3.9 mEq/L (ref 3.7–5.3)
Sodium: 141 mEq/L (ref 137–147)

## 2014-05-20 LAB — GLUCOSE, CAPILLARY: Glucose-Capillary: 82 mg/dL (ref 70–99)

## 2014-05-20 LAB — HEPARIN LEVEL (UNFRACTIONATED)
HEPARIN UNFRACTIONATED: 0.22 [IU]/mL — AB (ref 0.30–0.70)
Heparin Unfractionated: 0.52 IU/mL (ref 0.30–0.70)

## 2014-05-20 MED ORDER — SODIUM CHLORIDE 0.9 % IR SOLN
80.0000 mg | Status: DC
  Filled 2014-05-20: qty 2

## 2014-05-20 MED ORDER — SODIUM CHLORIDE 0.9 % IV SOLN
INTRAVENOUS | Status: DC
  Administered 2014-05-21: 06:00:00 via INTRAVENOUS

## 2014-05-20 MED ORDER — CEFAZOLIN SODIUM-DEXTROSE 2-3 GM-% IV SOLR
2.0000 g | INTRAVENOUS | Status: DC
  Filled 2014-05-20: qty 50

## 2014-05-20 MED ORDER — CHLORHEXIDINE GLUCONATE 4 % EX LIQD
60.0000 mL | Freq: Once | CUTANEOUS | Status: AC
  Administered 2014-05-21: 4 via TOPICAL
  Filled 2014-05-20: qty 60

## 2014-05-20 MED ORDER — CARVEDILOL 12.5 MG PO TABS
12.5000 mg | ORAL_TABLET | Freq: Two times a day (BID) | ORAL | Status: DC
  Administered 2014-05-20 – 2014-05-22 (×4): 12.5 mg via ORAL
  Filled 2014-05-20 (×6): qty 1

## 2014-05-20 NOTE — Progress Notes (Signed)
ANTICOAGULATION CONSULT NOTE - Follow Up Consult  Pharmacy Consult for heparin Indication: NSTEMI  Labs:  Recent Labs  05/18/14 0310  05/19/14 0227 05/19/14 1200 05/20/14 0216  HGB 10.4*  --  10.2*  --  10.0*  HCT 31.0*  --  30.6*  --  30.4*  PLT 140*  --  137*  --  143*  LABPROT  --   --  17.1*  --   --   INR  --   --  1.39  --   --   HEPARINUNFRC 0.23*  < > 0.10* 0.31 0.22*  CREATININE 1.45*  --  1.35  --   --   < > = values in this interval not displayed.   Assessment: 68yo male subtherapeutic on heparin after resumed post cath, was previously at low end of goal at current rate and may need more time to accumulate.  Goal of Therapy:  Heparin level 0.3-0.7 units/ml   Plan:  Will increase heparin gtt slightly to 1800 units/hr and check level in 6hr.  Wynona Neat, PharmD, BCPS  05/20/2014,3:27 AM

## 2014-05-20 NOTE — Progress Notes (Signed)
Patient Name: Anthony Wolfe Date of Encounter: 05/20/2014     Principal Problem:   Ventricular tachycardia Active Problems:   S/P CABG x 3    SUBJECTIVE  The patient continues to have frequent PVCs but no VT. No chest pain. Wants to go home.  EP consult pending.  CURRENT MEDS . aspirin  81 mg Oral Daily  . atorvastatin  80 mg Oral q1800  . carvedilol  12.5 mg Oral BID WC  . clopidogrel  75 mg Oral Daily  . dasatinib  100 mg Oral Daily  . lisinopril  2.5 mg Oral Daily  . ondansetron (ZOFRAN) IV  4 mg Intravenous Once  . sodium chloride  3 mL Intravenous Q12H  . sodium chloride  3 mL Intravenous Q12H    OBJECTIVE  Filed Vitals:   05/20/14 0750 05/20/14 0800 05/20/14 0930 05/20/14 1145  BP: 135/55  147/69 145/54  Pulse:  76 78   Temp: 98.5 F (36.9 C)   98.9 F (37.2 C)  TempSrc: Oral   Oral  Resp: 21  23   Height:      Weight:      SpO2: 95%  94% 96%    Intake/Output Summary (Last 24 hours) at 05/20/14 1253 Last data filed at 05/20/14 0600  Gross per 24 hour  Intake 477.92 ml  Output    475 ml  Net   2.92 ml   Filed Weights   05/18/14 0339 05/19/14 0326 05/20/14 0300  Weight: 196 lb 6.9 oz (89.1 kg) 193 lb 12.6 oz (87.9 kg) 196 lb 3.4 oz (89 kg)    PHYSICAL EXAM  General: Pleasant, NAD. Neuro: Alert and oriented X 3. Moves all extremities spontaneously. Psych: Normal affect. HEENT:  Normal  Neck: Supple without bruits or JVD. Lungs:  Resp regular and unlabored, CTA. Heart: RRR no s3, s4, or murmurs. Abdomen: Soft, non-tender, non-distended, BS + x 4.  Extremities: No clubbing, cyanosis or edema. DP/PT/Radials 2+ and equal bilaterally.  Accessory Clinical Findings  CBC  Recent Labs  05/19/14 0227 05/20/14 0216  WBC 9.2 7.4  HGB 10.2* 10.0*  HCT 30.6* 30.4*  MCV 95.3 95.3  PLT 137* 194*   Basic Metabolic Panel  Recent Labs  05/19/14 0227 05/20/14 0216  NA 141 141  K 3.8 3.9  CL 105 107  CO2 22 19  GLUCOSE 89 90  BUN 21 16    CREATININE 1.35 1.02  CALCIUM 8.4 8.1*   Liver Function Tests No results found for this basename: AST, ALT, ALKPHOS, BILITOT, PROT, ALBUMIN,  in the last 72 hours No results found for this basename: LIPASE, AMYLASE,  in the last 72 hours Cardiac Enzymes No results found for this basename: CKTOTAL, CKMB, CKMBINDEX, TROPONINI,  in the last 72 hours BNP No components found with this basename: POCBNP,  D-Dimer No results found for this basename: DDIMER,  in the last 72 hours Hemoglobin A1C No results found for this basename: HGBA1C,  in the last 72 hours Fasting Lipid Panel No results found for this basename: CHOL, HDL, LDLCALC, TRIG, CHOLHDL, LDLDIRECT,  in the last 72 hours Thyroid Function Tests No results found for this basename: TSH, T4TOTAL, FREET3, T3FREE, THYROIDAB,  in the last 72 hours  TELE  NSR with PVCs  ECG    Radiology/Studies  X-ray Chest Pa And Lateral  05/16/2014   CLINICAL DATA:  Acute onset of syncope.  Initial encounter.  EXAM: CHEST  2 VIEW  COMPARISON:  Chest radiograph performed earlier  today at 7:49 p.m.  FINDINGS: The lungs are well expanded. Vascular congestion is noted, with bilateral central airspace opacities, concerning for mild flash pulmonary edema. A small left pleural effusion is seen. No pneumothorax is seen.  The cardiomediastinal silhouette is borderline normal in size. The patient is status post median sternotomy. External pacing pads are noted. No acute osseous abnormalities are seen.  IMPRESSION: Vascular congestion noted, with bilateral central airspace opacities, new from the recent prior study, concerning for mild flash pulmonary edema. Small left pleural effusion noted.   Electronically Signed   By: Garald Balding M.D.   On: 05/16/2014 23:10   Dg Chest Port 1 View  05/16/2014   CLINICAL DATA:  Supraventricular tachycardia  EXAM: PORTABLE CHEST - 1 VIEW  COMPARISON:  04/03/2009  FINDINGS: Borderline cardiomegaly. No acute infiltrate or  pleural effusion. No pulmonary edema. Status post CABG.  IMPRESSION: Borderline cardiomegaly.  No active disease.   Electronically Signed   By: Lahoma Crocker M.D.   On: 05/16/2014 20:00    ASSESSMENT AND PLAN 68 yo with history of CML and CAD s/p CABG presents after episode of VT with near syncope.  1. VT: Near-syncope. Had DCCV by EMS. No recurrent VT overnight. Suspect this was due to ischemia and patient has evidence for NSTEMI.  - Started beta blocker, Coreg.  2. CAD: s/p CABG. Has not seen cardiologist for several years. Only on ASA at home. He had mild "indigestion" around the time of pre-syncope episode. Currently, feels fine with no chest pain or dyspnea. NSTEMI with TnI 11, do not think this is just demand ischemia. ECG with inferior Qs, very slight ST elevation.  Cath on 05/19/14 shows occluded SVG to RCA. Has right-to-right collaterals. Medical therapy. - Continue ASA, Plavix,  statin.  - On beta blocker, low dose lisinopril.  - Echo on 05/17/14 reveals: EF 35-40 % with severe inferior wall hypokinesis.   3. CML: This appears to be stable on chemotherapy (dasatinib).   Plan: EP will see patient today. ?ICD for secondary prevention vs LifeVest.  Signed, Darlin Coco MD

## 2014-05-20 NOTE — Consult Note (Signed)
ELECTROPHYSIOLOGY CONSULT NOTE    Patient ID: Anthony Wolfe MRN: [id number], DOB/AGE: [birth year] 68 y.o.  Admit date: 05/16/2014 Date of Consult: 05/20/2014  Primary Physician: Odette Fraction, MD Primary Cardiologist: Radford Pax  Reason for Consultation: ventricular tachycardia  HPI:  Anthony Wolfe is a 68 y.o. male with a past medical history significant for CAD (s/p CABG), hypertension, hyperlipidemia, frequent PVC's (previously treated with Verapamil), and CML (followed by Alvy Bimler).  He was getting ready to go hunting the afternoon of 05-16-14 when he developed nausea, vomiting, and profound weakness.  He was able to make it back to the truck where his cell phone was after several minutes and called 911.  He is not clear as to whether he passed out of not. On their arrival, he was found to be in VT at a cycle length of 255msec requiring cardioversion.  He was transferred to [facility] for [facility] evaluation.   He has had one other episode about 3 weeks ago that lasted approximately 30 minutes of nausea/vomiting and extreme weakness.  He did not seek medical attention at that time.   Echocardiogram demonstrated EF 35-40% (previously 50%), global mid hypokinesis with inferior septal hypokinesis, grade 2 diastolic dysfunction, LA 44.    Catheterization 05-19-14 demonstrated occluded SVG to RCA with no targets for revascularization.  EF 40%.    His Verapamil has been changed to Carvedilol and Lisinopril has been added.  He has had no further sustained ventricular tachycardia, but does have frequent multifocal PVC's and NSVT.   He denies chest pain, shortness of breath, LE edema, abdominal pain, melena.  He does have intermittent fevers.  ROS is otherwise negative except as outlined above.   Past Medical History  Diagnosis Date  . Lymphocytosis   . Hyperlipidemia   . HTN (hypertension)   . Allergic rhinitis   . Prostate hypertrophy   . Anxiety   . CAD (coronary artery disease)     ASCVD,  borderline left main and single-vessel Occluded RCA, 40% left main, catheterization 11/2002 [name])  . TIA (transient ischemic attack)     ASPVD, S./P. right CEA, 2002, and known occluded left carotid  . 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 Ischemic cardiomyopathy     EF 36% now resolved  . Carotid disease, bilateral     right CEA, known occluded left carotid  . Depression   . Arrhythmia     PVC's with Holter showing PVC load of 22%  . Muscle spasm 05/28/2013  . CML in remission 07/29/2013  . Anemia in neoplastic disease 09/25/2013     Surgical History:  Past Surgical History  Procedure Laterality Date  . Coronary artery bypass graft  02/2009    3 vessel  . Colonoscopy  07/2010  . Carotidectomy  2003    right side  . Back surgery       Prescriptions prior to admission  Medication Sig Dispense Refill  . aspirin 325 MG tablet Take 325 mg by mouth daily.      . dasatinib (SPRYCEL) 100 MG tablet Take 100 mg by mouth daily.      Marland Kitchen guaiFENesin (MUCINEX) 600 MG 12 hr tablet Take 600 mg by mouth 2 (two) times daily as needed for cough or to loosen phlegm.      . sildenafil (VIAGRA) 100 MG tablet Take 100 mg by mouth daily as needed for erectile dysfunction.      . simvastatin (ZOCOR) 40 MG tablet Take 40 mg by mouth daily.      Marland Kitchen  verapamil (CALAN-SR) 180 MG CR tablet Take 180 mg by mouth at bedtime.        Inpatient Medications:  . aspirin  81 mg Oral Daily  . atorvastatin  80 mg Oral q1800  . carvedilol  12.5 mg Oral BID WC  . clopidogrel  75 mg Oral Daily  . dasatinib  100 mg Oral Daily  . lisinopril  2.5 mg Oral Daily  . ondansetron (ZOFRAN) IV  4 mg Intravenous Once  . sodium chloride  3 mL Intravenous Q12H  . sodium chloride  3 mL Intravenous Q12H    Allergies:  Allergies  Allergen Reactions  . Norvasc [Amlodipine Besylate]     rash  . Sulfa Antibiotics Hives    History   Social History  . Marital Status: Married    Spouse Name: N/A    Number of  Children: 2  . Years of Education: N/A   Occupational History  .      retired Development worker, [employer]   Social History Main Topics  . Smoking status: Former Smoker    Quit date: 08/08/1997  . Smokeless tobacco: Former Systems developer  . Alcohol Use: No  . Drug Use: No  . Sexual Activity: Not on file   Other Topics Concern  . Not on file   Social History Narrative  . No narrative on file     Family History  Problem Relation Age of Onset  . Heart disease Mother   . Alcohol abuse Father     BP 145/54  Pulse 78  Temp(Src) 98.9 F (37.2 C) (Oral)  Resp 23  Ht 5\' 9"  (1.753 m)  Wt 196 lb 3.4 oz (89 kg)  BMI 28.96 kg/m2  SpO2 96%  Physical Exam: Filed Vitals:   05/20/14 0800 05/20/14 0930 05/20/14 1145 05/20/14 1625  BP:  147/69 145/54 159/69  Pulse: 76 78    Temp:   98.9 F (37.2 C) 98.6 F (37 C)  TempSrc:   Oral Oral  Resp:  23  22  Height:      Weight:      SpO2:  94% 96% 92%    GEN- The patient is well appearing, alert and oriented x 3 today.   Head- normocephalic, atraumatic Eyes-  Sclera clear, conjunctiva pink Ears- hearing intact Oropharynx- clear Neck- supple, Lungs- Clear to ausculation bilaterally, normal work of breathing Heart- Regular rate and rhythm  GI- soft, NT, ND, + BS Extremities- no clubbing, cyanosis, or edema, groin is without hematoma/ bruit MS- no significant deformity or atrophy Skin- no rash or lesion Psych- euthymic mood, full affect Neuro- strength and sensation are intact    Labs:   Lab Results  Component Value Date   WBC 7.4 05/20/2014   HGB 10.0* 05/20/2014   HCT 30.4* 05/20/2014   MCV 95.3 05/20/2014   PLT 143* 05/20/2014    Recent Labs Lab 05/20/14 0216  NA 141  K 3.9  CL 107  CO2 19  BUN 16  CREATININE 1.02  CALCIUM 8.1*  GLUCOSE 90   Lab Results  Component Value Date   TROPONINI 11.71* 05/17/2014    Radiology/Studies: X-ray Chest Pa And Lateral 05/16/2014   CLINICAL DATA:  Acute onset of syncope.  Initial encounter.   EXAM: CHEST  2 VIEW  COMPARISON:  Chest radiograph performed earlier today at 7:49 p.m.  FINDINGS: The lungs are well expanded. Vascular congestion is noted, with bilateral central airspace opacities, concerning for mild flash pulmonary edema. A small left pleural effusion is  seen. No pneumothorax is seen.  The cardiomediastinal silhouette is borderline normal in size. The patient is status post median sternotomy. External pacing pads are noted. No acute osseous abnormalities are seen.  IMPRESSION: Vascular congestion noted, with bilateral central airspace opacities, new from the recent prior study, concerning for mild flash pulmonary edema. Small left pleural effusion noted.   Electronically Signed   By: Garald Balding M.D.   On: 05/16/2014 23:10   Dg Chest Port 1 View 05/16/2014   CLINICAL DATA:  Supraventricular tachycardia  EXAM: PORTABLE CHEST - 1 VIEW  COMPARISON:  04/03/2009  FINDINGS: Borderline cardiomegaly. No acute infiltrate or pleural effusion. No pulmonary edema. Status post CABG.  IMPRESSION: Borderline cardiomegaly.  No active disease.   Electronically Signed   By: Lahoma Crocker M.D.   On: 05/16/2014 20:00    EKG: sinus rhythm, PR 226, PVC, inferior infarction  TELEMETRY: sinus rhythm with frequent ventricular ectopy  A/P 1. Sustained hemodynamically unstable VT Required defibrillation due to unstable VT with Cl 250 msec.  This was a RBB superior axis Vt arising from the inferoapical LV.  The patients VT occurred in the setting of acute MI.  He continues to have frequent ventricular ectopy. His EF is reduced (35-40%).  I would therefore advise EP study at this time.  I am concerned that he does not have revascularize disease and may thus have ongoing ischemia.  He may thus still have substrate for recurrent VT.  If his EP study reveals inducible VT, then I would advise ICD.  If he does not have inducible VT then medical management for his ischemic cardiomyopathy/ CAD would be advised.     Risks, benefits, and alternatives to EP study and possible ICD implantation were discussed at length with the patient and his spouse today.  They are willing to proceed.  I will therefore schedule the procedure with Dr Caryl Comes tomorrow.  2. NSTEMI The patient had MI felt to be due to SVG to RCA occlusion.  Aggressive medical therapy is advised. Stop heparin drip at this time.  3. Ischemic CM Medical therapy to be titrated.  Repeat echo after medicine is optimized.  No driving x 6 months

## 2014-05-20 NOTE — Progress Notes (Signed)
Ambulated with pt down the hall on RA pts sats remained 88% on RA when returned to the room oxygen sats 86% on RA while sitting. Pt reports that he gets easily fatigued with any exercise. 2LNC applied and oxygen sats back up to 95%. IS given and education provided about the use.

## 2014-05-20 NOTE — Progress Notes (Signed)
[facility] for heparin Indication: ACS  Allergies  Allergen Reactions  . Norvasc [Amlodipine Besylate]     rash  . Sulfa Antibiotics Hives    Patient Measurements: Height: 5\' 9"  (175.3 cm) Weight: 196 lb 3.4 oz (89 kg) IBW/kg (Calculated) : 70.7 Heparin Dosing Weight: 89 kg  Vital Signs: Temp: 98.9 F (37.2 C) (10/13 1145) Temp Source: Oral (10/13 1145) BP: 145/54 mmHg (10/13 1145) Pulse Rate: 78 (10/13 0930)  Labs:  Recent Labs  05/18/14 0310  05/19/14 0227 05/19/14 1200 05/20/14 0216 05/20/14 1100  HGB 10.4*  --  10.2*  --  10.0*  --   HCT 31.0*  --  30.6*  --  30.4*  --   PLT 140*  --  137*  --  143*  --   LABPROT  --   --  17.1*  --   --   --   INR  --   --  1.39  --   --   --   HEPARINUNFRC 0.23*  < > 0.10* 0.31 0.22* 0.52  CREATININE 1.45*  --  1.35  --  1.02  --   < > = values in this interval not displayed.  Estimated Creatinine Clearance: 74.3 ml/min (by C-G formula based on Cr of 1.02).    Assessment: 68 y/o male w/ SOB and tachycardia in VT. NSTEMI likely secondary to poor perfusion during VT event. Troponin up to 11. No further VT noted overnight.   S/p cath, heparin resumed Heparin level now therapeutic  Goal of Therapy:  Goal heparin level of 0.3 - 0.7 Monitor platelets per anticoagulation protocol: yes  Plan:  Continue heparin at 1800 units/hr Follow up AM labs  Thank you. Anette Guarneri, PharmD [phone]  05/20/2014 1:14 PM

## 2014-05-21 ENCOUNTER — Encounter (HOSPITAL_COMMUNITY)
Admission: EM | Disposition: A | Discharge status: Home / Self Care | Payer: Self-pay | Source: Home / Self Care | Attending: Internal Medicine

## 2014-05-21 DIAGNOSIS — I255 Ischemic cardiomyopathy: Secondary | ICD-10-CM | POA: Diagnosis present

## 2014-05-21 DIAGNOSIS — I472 Ventricular tachycardia: Secondary | ICD-10-CM

## 2014-05-21 DIAGNOSIS — I214 Non-ST elevation (NSTEMI) myocardial infarction: Secondary | ICD-10-CM | POA: Diagnosis present

## 2014-05-21 HISTORY — PX: EP IMPLANTABLE DEVICE: SHX172B

## 2014-05-21 HISTORY — PX: ELECTROPHYSIOLOGIC STUDY: SHX172A

## 2014-05-21 HISTORY — PX: ELECTROPHYSIOLOGY STUDY: SHX5467

## 2014-05-21 LAB — GLUCOSE, CAPILLARY: Glucose-Capillary: 93 mg/dL (ref 70–99)

## 2014-05-21 LAB — CBC
HCT: 32.4 % — ABNORMAL LOW (ref 39.0–52.0)
Hemoglobin: 10.8 g/dL — ABNORMAL LOW (ref 13.0–17.0)
MCH: 31.4 pg (ref 26.0–34.0)
MCHC: 33.3 g/dL (ref 30.0–36.0)
MCV: 94.2 fL (ref 78.0–100.0)
PLATELETS: 110 10*3/uL — AB (ref 150–400)
RBC: 3.44 MIL/uL — ABNORMAL LOW (ref 4.22–5.81)
RDW: 14.7 % (ref 11.5–15.5)
WBC: 7.2 10*3/uL (ref 4.0–10.5)

## 2014-05-21 LAB — CLOSTRIDIUM DIFFICILE BY PCR: CDIFFPCR: NEGATIVE

## 2014-05-21 SURGERY — ELECTROPHYSIOLOGY STUDY
Anesthesia: LOCAL

## 2014-05-21 MED ORDER — SODIUM CHLORIDE 0.9 % IV SOLN: INTRAVENOUS | Status: DC

## 2014-05-21 MED ORDER — LIDOCAINE HCL (PF) 1 % IJ SOLN
INTRAMUSCULAR | Status: AC
  Filled 2014-05-21: qty 60

## 2014-05-21 MED ORDER — SODIUM CHLORIDE 0.9 % IJ SOLN
3.0000 mL | Freq: Two times a day (BID) | INTRAMUSCULAR | Status: DC
  Administered 2014-05-22 – 2014-05-23 (×2): 3 mL via INTRAVENOUS

## 2014-05-21 MED ORDER — ACETAMINOPHEN 325 MG PO TABS: 650.0000 mg | ORAL_TABLET | ORAL | Status: DC | PRN

## 2014-05-21 MED ORDER — SODIUM CHLORIDE 0.9 % IV SOLN: INTRAVENOUS | Status: AC

## 2014-05-21 MED ORDER — ACETAMINOPHEN 325 MG PO TABS: 325.0000 mg | ORAL_TABLET | ORAL | Status: DC | PRN

## 2014-05-21 MED ORDER — CEFAZOLIN SODIUM-DEXTROSE 2-3 GM-% IV SOLR
2.0000 g | INTRAVENOUS | Status: DC
  Filled 2014-05-21: qty 50

## 2014-05-21 MED ORDER — FENTANYL CITRATE 0.05 MG/ML IJ SOLN
INTRAMUSCULAR | Status: AC
  Filled 2014-05-21: qty 2

## 2014-05-21 MED ORDER — BUPIVACAINE HCL (PF) 0.25 % IJ SOLN
INTRAMUSCULAR | Status: AC
  Filled 2014-05-21: qty 60

## 2014-05-21 MED ORDER — CEFAZOLIN SODIUM 1-5 GM-% IV SOLN
1.0000 g | Freq: Four times a day (QID) | INTRAVENOUS | Status: AC
  Administered 2014-05-21 – 2014-05-22 (×3): 1 g via INTRAVENOUS
  Filled 2014-05-21 (×3): qty 50

## 2014-05-21 MED ORDER — ONDANSETRON HCL 4 MG/2ML IJ SOLN: 4.0000 mg | Freq: Four times a day (QID) | INTRAMUSCULAR | Status: DC | PRN

## 2014-05-21 MED ORDER — MIDAZOLAM HCL 5 MG/5ML IJ SOLN
INTRAMUSCULAR | Status: AC
  Filled 2014-05-21: qty 5

## 2014-05-21 MED ORDER — SODIUM CHLORIDE 0.9 % IV SOLN: 250.0000 mL | INTRAVENOUS | Status: DC | PRN

## 2014-05-21 MED ORDER — CHLORHEXIDINE GLUCONATE CLOTH 2 % EX PADS
6.0000 | MEDICATED_PAD | Freq: Once | CUTANEOUS | Status: AC
Start: 2014-05-21 — End: 2014-05-21
  Administered 2014-05-21: 6 via TOPICAL

## 2014-05-21 MED ORDER — SODIUM CHLORIDE 0.9 % IJ SOLN: 3.0000 mL | INTRAMUSCULAR | Status: DC | PRN

## 2014-05-21 MED ORDER — SODIUM CHLORIDE 0.9 % IR SOLN
80.0000 mg | Status: DC
  Filled 2014-05-21: qty 2

## 2014-05-21 MED ORDER — CHLORHEXIDINE GLUCONATE CLOTH 2 % EX PADS: 6.0000 | MEDICATED_PAD | Freq: Once | CUTANEOUS | Status: DC

## 2014-05-21 MED ORDER — HEPARIN (PORCINE) IN NACL 2-0.9 UNIT/ML-% IJ SOLN
INTRAMUSCULAR | Status: AC
  Filled 2014-05-21: qty 500

## 2014-05-21 NOTE — CV Procedure (Signed)
Anthony Wolfe [id number]  [id number]  Preop Dx:VT ICM + Troponin Postop Dx same/ Inducible sustained monomorphic VT  Procedure: EPS  Cx: None   EBL: Minimal    Dictation number [id number]  Virl Axe, MD 05/21/2014 4:57 PM

## 2014-05-21 NOTE — CV Procedure (Signed)
Anthony Wolfe [id number]  [id number]  Preop Dx: Inducible sustained VT with ischemic cardiomyopathy Postop Dx same/    Procedure: single chamber ICD implantation without intraoperative defibrillation threshold testing  Following the obtaining of informed consent the patient was brought to the electrophysiology laboratory in place of the fluoroscopic table in the supine position. After routine prep and drape, lidocaine was infiltrated in the prepectoral subclavicular region and an incision was made and carried down to the layer of the prepectoral fascia using electrocautery and sharp dissection. A pocket was formed similarly.  Thereafter an attention was turned to gain access to the extrathoracic left subclavian vein which was accomplished without  difficulty and without the aspiration of air or puncture of the artery. A 9 French sheath was placed for which was then passed a  single coil  active fixation defibrillator lead, model Medtronic [id number] serial number [id number]. It was  passed under fluoroscopic guidance to the right ventricular apex. In its location the bipolar R wave was 7.1 millivolts, impedance was 683 ohms, the pacing threshold was 0.6 volts at 0.5 msec. Current at threshold was 1.0 mA.  There was no diaphragmatic pacing at 10 V. The current of injury was brisk .  The lead was secured to the prepectoral fascia and then attached to a Medtronic  ICD, serial number  [id number].  Through the device, the bipolar R wave was 6.5 millivolts, impedance was 475 ohms, the pacing threshold was 0.5 volts at 0.4 msec. High-voltage impedance was 53 ohms.  The pocket was copiously irrigated with antibiotic containing saline solution. Hemostasis was assured, and the device and the lead was placed in the pocket and secured to the prepectoral fascia.  The wound was closed in 2   layers in normal fashion. The wound was washed dried and a DERMABOND  was then applied. Needle counts sponge counts and instrument  counts were correct at the end of the procedure according to the staff.   Patient tolerated the procedure without apparent complication  EBL  Minimal      Cx: None      Virl Axe, MD 05/21/2014 4:58 PM  `

## 2014-05-21 NOTE — Progress Notes (Signed)
[facility]       Patient Name: Anthony Wolfe      SUBJECTIVE without chest pain or shortness of breath  Past Medical History  Diagnosis Date  . Lymphocytosis   . Hyperlipidemia   . HTN (hypertension)   . Allergic rhinitis   . Prostate hypertrophy   . Anxiety   . CAD (coronary artery disease)     ASCVD, borderline left main and single-vessel Occluded RCA, 40% left main, catheterization 11/2002 [name])  . TIA (transient ischemic attack)     ASPVD, S./P. right CEA, 2002, and known occluded left carotid  . 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 Ischemic cardiomyopathy     EF 36% now resolved  . Carotid disease, bilateral     right CEA, known occluded left carotid  . Depression   . Arrhythmia     PVC's with Holter showing PVC load of 22%  . Muscle spasm 05/28/2013  . CML in remission 07/29/2013  . 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 aspirin  81 mg Oral Daily  . atorvastatin  80 mg Oral q1800  . carvedilol  12.5 mg Oral BID WC  .  ceFAZolin (ANCEF) IV  2 g Intravenous On Call  . clopidogrel  75 mg Oral Daily  . dasatinib  100 mg Oral Daily  . gentamicin irrigation  80 mg Irrigation On Call  . lisinopril  2.5 mg Oral Daily  . ondansetron (ZOFRAN) IV  4 mg Intravenous Once  . sodium chloride  3 mL Intravenous Q12H  . sodium chloride  3 mL Intravenous Q12H   Continuous Infusions: . sodium chloride 50 mL/hr at 05/21/14 0544   sodium chloride, acetaminophen, diazepam, morphine injection, ondansetron (ZOFRAN) IV, oxyCODONE-acetaminophen, sodium chloride, zolpidem    PHYSICAL EXAM Filed Vitals:   05/20/14 1955 05/21/14 0001 05/21/14 0435 05/21/14 0700  BP: 153/65 131/52 115/68 130/52  Pulse: 81 81 79 74  Temp: 99.6 F (37.6 C) 99.4 F (37.4 C) 98 F (36.7 C) 98.2 F (36.8 C)  TempSrc: Oral Oral Oral Oral  Resp: 29 29 29 19   Height:      Weight:   190 lb 14.7 oz (86.6 kg)   SpO2: 90% 95% 96% 95%    Well developed  and nourished in no acute distress HENT normal Neck supple with JVP-flat Clear Regular rate and rhythm, no murmurs or gallops Abd-soft with active BS No Clubbing cyanosis edema Skin-warm and dry A & Oriented  Grossly normal sensory and motor function   TELEMETRY: Reviewed telemetry pt in NSR   Intake/Output Summary (Last 24 hours) at 05/21/14 0827 Last data filed at 05/21/14 0600  Gross per 24 hour  Intake 211.33 ml  Output      2 ml  Net 209.33 ml    LABS: Basic Metabolic Panel:  Recent Labs Lab 05/16/14 1932 05/16/14 2201 05/17/14 0224 05/18/14 0310 05/19/14 0227 05/20/14 0216  NA 144  --  146 141 141 141  K 3.4*  --  4.0 3.9 3.8 3.9  CL 107  --  109 106 105 107  CO2 22  --  22 20 22 19   GLUCOSE 169*  --  113* 98 89 90  BUN 13  --  15 22 21 16   CREATININE 1.22 1.17 1.14 1.45* 1.35 1.02  CALCIUM 8.4  --  8.9 8.3* 8.4 8.1*  MG  --  2.1  --   --   --   --  Cardiac Enzymes: No results found for this basename: CKTOTAL, CKMB, CKMBINDEX, TROPONINI,  in the last 72 hours CBC:  Recent Labs Lab 05/16/14 1932 05/16/14 2201 05/17/14 0224 05/18/14 0310 05/19/14 0227 05/20/14 0216 05/21/14 0229  WBC 10.2 10.2 11.1* 12.8* 9.2 7.4 7.2  NEUTROABS 8.3*  --   --   --   --   --   --   HGB 11.5* 11.7* 11.8* 10.4* 10.2* 10.0* 10.8*  HCT 35.1* 35.9* 35.5* 31.0* 30.6* 30.4* 32.4*  MCV 96.4 97.8 95.4 95.1 95.3 95.3 94.2  PLT 185 179 165 140* 137* 143* 110*   PROTIME:  Recent Labs  05/19/14 0227  LABPROT 17.1*  INR 1.39      ASSESSMENT AND PLAN:  Principal Problem:   Ventricular tachycardia Active Problems:   S/P CABG x 3   NSTEMI (non-ST elevated myocardial infarction)   Cardiomyopathy, ischemic  Pt presenting with unstable monomorphic VT in context of MI    Quoted from Guidelines 2008 >>Sustained monomorphic VT with prior MI is unlikely to be affected by revascularization.16 Myocardial revascularization may be sufficient therapy in patients  surviving VF in association with myocardial ischemia when ventricular function is normal and there is no history of an MI.   The anticipation then would be to undertake EPS to see if we can identify VT substrate with programmed stimulation.  If yes would proceed with ICD implantation and if not>> discharge with LifeVest and reassess in LVEF in 40days   Signed, Virl Axe MD  05/21/2014

## 2014-05-22 ENCOUNTER — Inpatient Hospital Stay (HOSPITAL_COMMUNITY): Payer: Medicare HMO

## 2014-05-22 ENCOUNTER — Telehealth: Payer: Self-pay | Admitting: Cardiology

## 2014-05-22 LAB — GLUCOSE, CAPILLARY: Glucose-Capillary: 104 mg/dL — ABNORMAL HIGH (ref 70–99)

## 2014-05-22 MED ORDER — LEVOFLOXACIN 500 MG PO TABS
500.0000 mg | ORAL_TABLET | Freq: Every day | ORAL | Status: DC
  Administered 2014-05-22 – 2014-05-23 (×2): 500 mg via ORAL
  Filled 2014-05-22 (×2): qty 1

## 2014-05-22 MED ORDER — LISINOPRIL 5 MG PO TABS
5.0000 mg | ORAL_TABLET | Freq: Every day | ORAL | Status: DC
  Administered 2014-05-23: 5 mg via ORAL
  Filled 2014-05-22: qty 1

## 2014-05-22 MED ORDER — CARVEDILOL 25 MG PO TABS
25.0000 mg | ORAL_TABLET | Freq: Two times a day (BID) | ORAL | Status: DC
  Administered 2014-05-22 – 2014-05-23 (×2): 25 mg via ORAL
  Filled 2014-05-22 (×4): qty 1

## 2014-05-22 NOTE — Progress Notes (Signed)
ICD Criteria  Current LVEF:30% ;Obtained < 1 month ago.  NYHA Functional Classification: Class II  Heart Failure History:  Yes, Duration of heart failure since onset is 3 to 9 months  Non-Ischemic Dilated Cardiomyopathy History:  No.  Atrial Fibrillation/Atrial Flutter:  No.  Ventricular Tachycardia History:  Yes, Hemodynamic status unknown. VT Type:  SVT - Monomorphic.  Cardiac Arrest History:  No  History of Syndromes with Risk of Sudden Death:  No.  Previous ICD:  No.  Electrophysiology Study: Yes, EP Study timeframe: < 1 month ago, Ventricular arrhythmias induced,  VT ablation performed, Findings:  Sustained Monomorphic VT  Prior MI: Yes, Most recent MI Timeframe is < or = to 40 days.  PPM: No.  OSA:  No  Patient Life Expectancy of >=1 year: Yes.  Anticoagulation Therapy:  Patient is NOT on anticoagulation therapy.   Beta Blocker Therapy:  Yes.   Ace Inhibitor/ARB Therapy:  Yes.   Pt is implanted for SECONDARY PREVENTION ,NOT PRIMARY PREVENTION As pt had sustained VT for which he was then inducible

## 2014-05-22 NOTE — Progress Notes (Signed)
SUBJECTIVE: The patient is doing well today.  At this time, he denies chest pain or shortness of breath.  Minimal incisional pain. He does have a productive cough of clear mucous.  TMax 99.1  S/p EPS/ICD implant 05-21-14  CURRENT MEDICATIONS: . aspirin  81 mg Oral Daily  . atorvastatin  80 mg Oral q1800  . carvedilol  12.5 mg Oral BID WC  .  ceFAZolin (ANCEF) IV  1 g Intravenous Q6H  . clopidogrel  75 mg Oral Daily  . dasatinib  100 mg Oral Daily  . lisinopril  2.5 mg Oral Daily  . ondansetron (ZOFRAN) IV  4 mg Intravenous Once  . sodium chloride  3 mL Intravenous Q12H  . sodium chloride  3 mL Intravenous Q12H  . sodium chloride  3 mL Intravenous Q12H      OBJECTIVE: Physical Exam: Filed Vitals:   05/22/14 0300 05/22/14 0400 05/22/14 0500 05/22/14 0755  BP: 170/65 177/64 178/66 165/70  Pulse: 79 80 79 83  Temp: 98.5 F (36.9 C)   98.8 F (37.1 C)  TempSrc: Oral   Oral  Resp: 16   20  Height:      Weight: 192 lb 14.4 oz (87.499 kg)     SpO2: 93% 95% 93% 91%    Intake/Output Summary (Last 24 hours) at 05/22/14 0849 Last data filed at 05/22/14 0212  Gross per 24 hour  Intake    960 ml  Output    675 ml  Net    285 ml    Telemetry reveals sinus rhythm with PVC's  GEN- The patient is well appearing, alert and oriented x 3 today.   Head- normocephalic, atraumatic Eyes-  Sclera clear, conjunctiva pink Ears- hearing intact Oropharynx- clear Neck- supple  Lungs- Clear to ausculation bilaterally, normal work of breathing Heart- Regular rate and rhythm, no murmurs, rubs or gallops, PMI not laterally displaced GI- soft, NT, ND, + BS Extremities- no clubbing, cyanosis, or edema Skin- ICD pocket is without hematoma Psych- euthymic mood, full affect Neuro- strength and sensation are intact  LABS: Basic Metabolic Panel:  Recent Labs  05/20/14 0216  NA 141  K 3.9  CL 107  CO2 19  GLUCOSE 90  BUN 16  CREATININE 1.02  CALCIUM 8.1*   CBC:  Recent Labs  05/20/14 0216 05/21/14 0229  WBC 7.4 7.2  HGB 10.0* 10.8*  HCT 30.4* 32.4*  MCV 95.3 94.2  PLT 143* 110*    RADIOLOGY: Dg Chest 2 View 05/22/2014   CLINICAL DATA:  Post insertion of AICD.  EXAM: CHEST  2 VIEW  COMPARISON:  Chest x-ray of 05/16/2014, and 05/16/2014  FINDINGS: And AICD lead is now present with the tip overlying the apex of the right ventricle. There are small pleural effusions present. There is airspace disease in the right mid lung which is new compared to 05/16/2014 and therefore most likely represents pneumonia. With cardiomegaly and small effusions mild CHF also is consideration. No pneumothorax is seen.  IMPRESSION: 1. AICD lead now present.  No pneumothorax. 2. Opacity in the right mid lung new since 05/16/2014, and therefore most consistent with pneumonia. 3. Cardiomegaly and small effusions.  Consider mild CHF.   Electronically Signed   By: Ivar Drape M.D.   On: 05/22/2014 08:11    ASSESSMENT AND PLAN:  Principal Problem:   Ventricular tachycardia Active Problems:   S/P CABG x 3   NSTEMI (non-ST elevated myocardial infarction)   Cardiomyopathy, ischemic  1. VT  Doing well s/p ICD Device interrogation is reviewed and normal He continues to have very frequent ventricular ectopy Increase coreg to 25mg  BID today  2. Pneumonia Hospital acquired pneumonia cxr is reviewed and discussed with patient Will start levaquin Keep in the hospital another day.  3. Hypertensive cardiovascular disease Elevated Increase coreg and lisinopril today  4. Ischemic CM/ CAD/ NSTEMI Clinically stable Increase coreg/ lisinopril  Transfer to telemetry Given new pneumonia, frequent ventricular ectopy,elevated BP, and recent NSTEMI, I think that we should keep in the hospital one more day. Ambulate,  Likely to discharge tomorrow am.

## 2014-05-22 NOTE — Progress Notes (Signed)
Pt received into room 2w17, wife at bedside, tele placed on pt, vitals taken, pt oriented to room and call bell Rickard Rhymes, RN

## 2014-05-22 NOTE — Op Note (Signed)
NAME:  Anthony Wolfe, Anthony Wolfe NO.:  [id number]  MEDICAL RECORD NO.:  [id number]  LOCATION:  [facility] [place]                        FACILITY:  [facility]  PHYSICIAN:  Deboraha Sprang, MD, FACCDATE OF BIRTH:  [birth year]  DATE OF PROCEDURE:  05/21/2014 DATE OF DISCHARGE:                              OPERATIVE REPORT   PREOPERATIVE DIAGNOSES:  Monomorphic ventricular tachycardia in the context of positive troponin and probable non-ST elevation myocardial infarction.  POSTOPERATIVE DIAGNOSIS:  Inducible sustained monomorphic ventricular tachycardia.  PROCEDURE:  Invasive electrophysiological study.  DESCRIPTION OF PROCEDURE:  Following obtaining informed consent, the patient was brought to the catheterization laboratory and placed on the fluoroscopic table in supine position.  After routine prep and drape, cardiac catheterization was performed with local anesthesia and conscious sedation.  Noninvasive blood pressure monitoring, transcutaneous oxygen saturation monitoring were performed continuously throughout the procedure.  Following the procedure, the catheters were removed.  Hemostasis was obtained.  The patient was prepared for ICD implantation in stable condition.  Catheters of 5-French quadripolar catheter was inserted via left femoral vein to the AV junction.  A size 5-French quadripolar catheter was inserted in the left femoral vein to the right ventricular apex.  This was then moved to the right atrial appendage.  Surface leads, 1 aVF and V1 were monitored continuously throughout the procedure.  Following insertion of the catheters, a stimulation protocol included, Incremental atrial pacing. Incremental ventricular pacing. Single and double atrial extrastimuli at paced cycle length of 600 and 400 milliseconds. Single and double ventricular extrastimuli from the right ventricular apex at a paced cycle length of 400:600 milliseconds.  END-TIDAL RESULTS:  End-tidal surface  electrocardiogram.  Initial rhythm was sinus; RR interval 709 milliseconds; P-wave duration 150 milliseconds; PR interval 194 milliseconds; QRS duration 128 milliseconds; QT interval 418 milliseconds.  I was not able to discern any specific HV interval.  END-TIDAL AV NODAL FUNCTION:  AV Wenckebach was 360 milliseconds. VA Wenckebach occurred at less than 400 milliseconds. There was evidence of dual AV nodal physiology both with PR longer than RR interval although an AH jump was not noted.  The patient also had inducible AV nodal reentrant tachycardia occurring reproducibly during ventricular pacing.  ACCESSORY PATHWAY FUNCTION:  No evidence of accessory pathway was identified.  VENTRICULAR STIMULATION:  Ventricular stimulation was undertaken as noted.  Ventricular tachycardia was induced with the cycle length of 260 milliseconds at 400:600:280:250.  Right bundle-branch block, left axis deviation.  It was terminated with antitachycardia pacing at a cycle length of 200 milliseconds.  IMPRESSION: 1. Normal sinus function. 2. Normal atrial function. 3. Abnormal AV nodal function with recurring AV nodal reentry. 4. Unable to measure the His-Purkinje interval. 5. No accessory pathway. 6. Abnormal ventricular response programmed stimulation with inducible     sustained monomorphic ventricular tachycardia with double     ventricular extrastimuli.  SUMMARY:  In conclusion, the results of electrophysiological testing demonstrated a persistent substrate for monomorphic ventricular tachycardia suggesting that the clinical arrhythmia may have in fact been the primary issue and not secondary.  Had been related to the ischemic event itself, 1 would have anticipated that he would not be inducible for sustained monomorphic VT.  Hence, given cardiomyopathy, prior myocardial infarction, presentation with sustained VT and now inducible sustained monomorphic ventricular tachycardia, he will  receive an ICD for secondary prevention.     Deboraha Sprang, MD, [facility]     SCK/MEDQ  D:  05/21/2014  T:  05/22/2014  Job:  [id number]

## 2014-05-22 NOTE — Telephone Encounter (Signed)
New message    Per amber   Please schedule TOC visit for 7-10 days. Will be discharged tomorrow. Thank you!

## 2014-05-22 NOTE — Progress Notes (Signed)
Report to [facility]; pt transferred to floor with tele

## 2014-05-23 ENCOUNTER — Encounter (HOSPITAL_COMMUNITY): Payer: Self-pay | Admitting: Cardiology

## 2014-05-23 DIAGNOSIS — J189 Pneumonia, unspecified organism: Secondary | ICD-10-CM | POA: Diagnosis present

## 2014-05-23 DIAGNOSIS — I25810 Atherosclerosis of coronary artery bypass graft(s) without angina pectoris: Secondary | ICD-10-CM | POA: Diagnosis present

## 2014-05-23 DIAGNOSIS — I2510 Atherosclerotic heart disease of native coronary artery without angina pectoris: Secondary | ICD-10-CM

## 2014-05-23 HISTORY — DX: Pneumonia, unspecified organism: J18.9

## 2014-05-23 LAB — GLUCOSE, CAPILLARY: Glucose-Capillary: 103 mg/dL — ABNORMAL HIGH (ref 70–99)

## 2014-05-23 MED ORDER — NITROGLYCERIN 0.4 MG SL SUBL: 0.4000 mg | SUBLINGUAL_TABLET | SUBLINGUAL | Status: DC | PRN

## 2014-05-23 MED ORDER — LISINOPRIL 5 MG PO TABS: 5.0000 mg | ORAL_TABLET | Freq: Every day | ORAL | Status: DC

## 2014-05-23 MED ORDER — LEVOFLOXACIN 500 MG PO TABS: 500.0000 mg | ORAL_TABLET | Freq: Every day | ORAL | Status: DC

## 2014-05-23 MED ORDER — CARVEDILOL 25 MG PO TABS: 25.0000 mg | ORAL_TABLET | Freq: Two times a day (BID) | ORAL | Status: DC

## 2014-05-23 MED ORDER — ACETAMINOPHEN 325 MG PO TABS: 325.0000 mg | ORAL_TABLET | ORAL | Status: DC | PRN

## 2014-05-23 MED ORDER — ATORVASTATIN CALCIUM 80 MG PO TABS: 80.0000 mg | ORAL_TABLET | Freq: Every day | ORAL | Status: DC

## 2014-05-23 MED ORDER — CLOPIDOGREL BISULFATE 75 MG PO TABS: 75.0000 mg | ORAL_TABLET | Freq: Every day | ORAL | Status: DC

## 2014-05-23 MED ORDER — ASPIRIN 81 MG PO CHEW: 81.0000 mg | CHEWABLE_TABLET | Freq: Every day | ORAL | Status: DC

## 2014-05-23 MED ORDER — OXYCODONE-ACETAMINOPHEN 5-325 MG PO TABS: 1.0000 | ORAL_TABLET | ORAL | Status: DC | PRN

## 2014-05-23 NOTE — Progress Notes (Signed)
Patient Name: Anthony Wolfe      SUBJECTIVE: without complaints  Past Medical History  Diagnosis Date  . Lymphocytosis   . Hyperlipidemia   . HTN (hypertension)   . Allergic rhinitis   . Prostate hypertrophy   . Anxiety   . CAD (coronary artery disease)     ASCVD, borderline left main and single-vessel Occluded RCA, 40% left main, catheterization 11/2002 [name])  . TIA (transient ischemic attack)     ASPVD, S./P. right CEA, 2002, and known occluded left carotid  . 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 Ischemic cardiomyopathy     EF 36% now resolved  . Carotid disease, bilateral     right CEA, known occluded left carotid  . Depression   . Arrhythmia     PVC's with Holter showing PVC load of 22%  . Muscle spasm 05/28/2013  . CML in remission 07/29/2013  . 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 aspirin  81 mg Oral Daily  . atorvastatin  80 mg Oral q1800  . carvedilol  25 mg Oral BID WC  . clopidogrel  75 mg Oral Daily  . dasatinib  100 mg Oral Daily  . levofloxacin  500 mg Oral Daily  . lisinopril  5 mg Oral Daily  . ondansetron (ZOFRAN) IV  4 mg Intravenous Once  . sodium chloride  3 mL Intravenous Q12H   Continuous Infusions:  sodium chloride, acetaminophen, diazepam, ondansetron (ZOFRAN) IV, oxyCODONE-acetaminophen, sodium chloride, zolpidem    PHYSICAL EXAM Filed Vitals:   05/22/14 1630 05/22/14 1942 05/23/14 0605 05/23/14 0719  BP: 138/52 129/62 164/62 137/58  Pulse: 84 78 87 82  Temp: 97.5 F (36.4 C) 98.9 F (37.2 C) 98.9 F (37.2 C)   TempSrc: Oral Oral Oral   Resp: 18 18 18    Height:      Weight:      SpO2: 94% 91% 90%     Well developed and nourished in no acute distress HENT normal Neck supple with JVP-flat Clear Regular rate and rhythm, no murmurs or gallops Abd-soft with active BS No Clubbing cyanosis edema Skin-warm and dry A & Oriented  Grossly normal sensory and motor  function   TELEMETRY: Reviewed telemetry pt in sinus:    Intake/Output Summary (Last 24 hours) at 05/23/14 0819 Last data filed at 05/22/14 1700  Gross per 24 hour  Intake    490 ml  Output      0 ml  Net    490 ml    LABS: Basic Metabolic Panel:  Recent Labs Lab 05/16/14 1932 05/16/14 2201 05/17/14 0224 05/18/14 0310 05/19/14 0227 05/20/14 0216  NA 144  --  146 141 141 141  K 3.4*  --  4.0 3.9 3.8 3.9  CL 107  --  109 106 105 107  CO2 22  --  22 20 22 19   GLUCOSE 169*  --  113* 98 89 90  BUN 13  --  15 22 21 16   CREATININE 1.22 1.17 1.14 1.45* 1.35 1.02  CALCIUM 8.4  --  8.9 8.3* 8.4 8.1*  MG  --  2.1  --   --   --   --    Cardiac Enzymes: No results found for this basename: CKTOTAL, CKMB, CKMBINDEX, TROPONINI,  in the last 72 hours CBC:  Recent Labs Lab 05/16/14 1932 05/16/14 2201 05/17/14 0224 05/18/14 0310 05/19/14 0227 05/20/14 0216 05/21/14 0229  WBC  10.2 10.2 11.1* 12.8* 9.2 7.4 7.2  NEUTROABS 8.3*  --   --   --   --   --   --   HGB 11.5* 11.7* 11.8* 10.4* 10.2* 10.0* 10.8*  HCT 35.1* 35.9* 35.5* 31.0* 30.6* 30.4* 32.4*  MCV 96.4 97.8 95.4 95.1 95.3 95.3 94.2  PLT 185 179 165 140* 137* 143* 110*      ASSESSMENT AND PLAN:  Principal Problem:   Ventricular tachycardia Active Problems:   S/P CABG x 3   NSTEMI (non-ST elevated myocardial infarction)   Cardiomyopathy, ischemic  Will discharge today Abx for 7 deays Wound check next week insturctions given sk 6 weeks Sk 12 weeks Continue current meds  Signed, Virl Axe MD  05/23/2014

## 2014-05-23 NOTE — Discharge Summary (Signed)
Physician Discharge Summary       Patient ID: Anthony Wolfe MRN: [id number] DOB/AGE: [birth year] 70 y.o.  Admit date: 05/16/2014 Discharge date: 05/23/2014  Discharge Diagnoses:  Principal Problem:   Ventricular tachycardia Active Problems:   NSTEMI (non-ST elevated myocardial infarction)   CAD (coronary artery disease) of artery bypass graft, occluded VG-RCA   Chronic myelogenous leukemia   CAD (coronary artery disease), hx of CABG   S/P CABG x 3   Cardiomyopathy, ischemic   Pneumonia   Discharged Condition: good  Procedures: 05/21/14 Invasive electrophysiological study, single chamber ICD implantation without intraoperative defibrillation threshold testing- [employer]- by Dr. Caryl Comes  05/19/14 Cardiac cath and graft visualization by Dr. Debbe Odea Course: 68 y.o. male s/p 3v CABG in 2010, last echo in 2014 with EF 50% without regional WMA, who presents after episode of prolonged VT 05/16/14. He was setting up to go [employer] when he suddenly felt weak, nauseous, warm, and had to sit down several times. Finally called for help, and when EMS arrived he was round to be in VT. Rate was 235 on strip from the field, but he did not lose consiousness. Amio bolus given without effect and he was hypotensive so was DC cardioverted successfully in the ambulance. He denied CP prior to procedure or now. He had no nausea, SOB. In ER Still with frequent PVCs on telemetry. Vitals stable in ED. Trop-I positive.   Admitted and cardiac cath revealed occluded VG-RCA  With pk troponin !!.  He has R-R collaterals and collaterals from septal and OM EF on V gram actually looks Closer to 40% Medical Rx.   EP then consulted for V tach-"Required defibrillation due to unstable VT with Cl 250 msec. This was a RBB superior axis Vt arising from the inferoapical LV. The patients VT occurred in the setting of acute MI. He continues to have frequent ventricular ectopy. His EF is reduced (35-40%). I would  therefore advise EP study at this time. I am concerned that he does not have revascularize disease and may thus have ongoing ischemia. He may thus still have substrate for recurrent VT. If his EP study reveals inducible VT, then I would advise ICD. If he does not have inducible VT then medical management for his ischemic cardiomyopathy/ CAD would be advised."  He did have inducible sustained monomorphic VT.  Then underwent placement of single chamber ICD.   With new pna levaquin was added and pt kept until the 16th .  He was seen and evaluated by Dr. Caryl Comes and found stable for discharge.  No driving for 6 months.  Follow up in device clinic next week, with Dr. Caryl Comes in 6 and 12 weeks.  Will continue antibiotic for 7 days.    Consults: cardiology  Significant Diagnostic Studies:  BMET    Component Value Date/Time   NA 141 05/20/2014 0216   NA 144 04/29/2014 0835   K 3.9 05/20/2014 0216   K 3.9 04/29/2014 0835   CL 107 05/20/2014 0216   CL 110* 11/14/2012 0918   CO2 19 05/20/2014 0216   CO2 24 04/29/2014 0835   GLUCOSE 90 05/20/2014 0216   GLUCOSE 133 04/29/2014 0835   GLUCOSE 108* 11/14/2012 0918   BUN 16 05/20/2014 0216   BUN 12.1 04/29/2014 0835   CREATININE 1.02 05/20/2014 0216   CREATININE 1.0 04/29/2014 0835   CREATININE 0.91 08/12/2013 1533   CALCIUM 8.1* 05/20/2014 0216   CALCIUM 8.6 04/29/2014 0835   GFRNONAA 72* 05/20/2014  0216   GFRNONAA 86 08/12/2013 1533   GFRAA 84* 05/20/2014 0216   GFRAA >89 08/12/2013 1533    CBC    Component Value Date/Time   WBC 7.2 05/21/2014 0229   WBC 4.8 04/29/2014 0835   RBC 3.44* 05/21/2014 0229   RBC 3.60* 04/29/2014 0835   HGB 10.8* 05/21/2014 0229   HGB 11.2* 04/29/2014 0835   HCT 32.4* 05/21/2014 0229   HCT 35.3* 04/29/2014 0835   PLT 110* 05/21/2014 0229   PLT 145 04/29/2014 0835   MCV 94.2 05/21/2014 0229   MCV 98.1* 04/29/2014 0835   MCH 31.4 05/21/2014 0229   MCH 31.1 04/29/2014 0835   MCHC 33.3 05/21/2014 0229   MCHC 31.7* 04/29/2014 0835     RDW 14.7 05/21/2014 0229   RDW 14.8* 04/29/2014 0835   LYMPHSABS 0.8 05/16/2014 1932   LYMPHSABS 1.1 04/29/2014 0835   MONOABS 0.9 05/16/2014 1932   MONOABS 0.4 04/29/2014 0835   EOSABS 0.0 05/16/2014 1932   EOSABS 0.1 04/29/2014 0835   BASOSABS 0.0 05/16/2014 1932   BASOSABS 0.0 04/29/2014 0835   Troponin 11  Echo: Study Conclusions - Left ventricle: The cavity size was normal. Wall thickness was normal. Systolic function was moderately reduced. The estimated ejection fraction was in the range of 35% to 40%. Global mild hypokinesis with inferior severe hypokinesis. Features are consistent with a pseudonormal left ventricular filling pattern, with concomitant abnormal relaxation and increased filling pressure (grade 2 diastolic dysfunction). - Aortic valve: There was no stenosis. - Mitral valve: Mildly calcified annulus. Normal thickness leaflets . There was trivial regurgitation. - Left atrium: The atrium was moderately dilated. - Right ventricle: The cavity size was normal. Systolic function was mildly reduced. - Right atrium: The atrium was mildly dilated. - Pulmonary arteries: No complete TR doppler jet so unable to estimate PA systolic pressure. - Inferior vena cava: The vessel was normal in size. The respirophasic diameter changes were in the normal range (>= 50%), consistent with normal central venous pressure. Impressions:  - Normal LV size and systolic function, EF 05-39%. Global mild hypokinesis with severe inferior hypokinesis. Normal RV size with mildly decreased systolic function. No significant valvular abnormalities  CHEST 2 VIEW  COMPARISON: Chest radiograph performed earlier today at 7:49 p.m.  FINDINGS:  The lungs are well expanded. Vascular congestion is noted, with  bilateral central airspace opacities, concerning for mild flash  pulmonary edema. A small left pleural effusion is seen. No  pneumothorax is seen.  The cardiomediastinal silhouette is  borderline normal in size. The  patient is status post median sternotomy. External pacing pads are  noted. No acute osseous abnormalities are seen.  IMPRESSION:  Vascular congestion noted, with bilateral central airspace  opacities, new from the recent prior study, concerning for mild  flash pulmonary edema. Small left pleural effusion noted.    CHEST 2 VIEW  COMPARISON: Chest x-ray of 05/16/2014, and 05/16/2014  FINDINGS:  And AICD lead is now present with the tip overlying the apex of the  right ventricle. There are small pleural effusions present. There is  airspace disease in the right mid lung which is new compared to  05/16/2014 and therefore most likely represents pneumonia. With  cardiomegaly and small effusions mild CHF also is consideration. No  pneumothorax is seen.  IMPRESSION:  1. AICD lead now present. No pneumothorax.  2. Opacity in the right mid lung new since 05/16/2014, and therefore  most consistent with pneumonia.  3. Cardiomegaly and small effusions. Consider  mild CHF.   Cardiac cath:  Catheterization  Indication: IMI  Procedure: After informed consent and clinical "time out" the right groin was prepped and draped in a sterile fashion. A 5Fr sheath was placed in the right femoral artery using seldinger technique and local lidocaine. Standard JL4, JR4 and angled pigtail catheters were used to engage the coronary arteries. Coronary arteries were visualized in orthogonal views using caudal and cranial angulation. RAO ventriculography was done using 22* cc of contrast.  Medications:  Versed: 2 mg's Fentanyl: 25 ug's  Coronary Arteries:  Right dominant with no anomalies  LM: Long segment 50% calcified mid vessel disease   LAD: 100% mid vessel occlusion distal vessel fills from LIMA  D1: 30% mid vessel disease large artery  D2 Small artery no focal stentois  Circumflex: Occluded proximally  OM1: Fills via SVG OM  OM2: Fills via SVG OM  RCA: Subtotally occluded  proximally with bridging collaterals  PDA: Seen from OM collaterals  PLA: Seen from OM collaterals  Ventriculography: EF: 40 %, No significant MR Inferior wall hypokinesis  Hemodynamics:  Aortic Pressure: 132 54 mmHg LV Pressure: 135 8 mmHg   Impression: Recent IMI with occluded SVG to RCA Troponin 11 Has Right to Right collaterals and collaterals from septal and OM EF on V gram actually looks  Closer to 40% Medical Rx    Discharge Exam: Blood pressure 137/58, pulse 82, temperature 98.9 F (37.2 C), temperature source Oral, resp. rate 18, height 5\' 9"  (1.753 m), weight 192 lb 14.4 oz (87.499 kg), SpO2 90.00%.   Disposition: Home     Medication List    STOP taking these medications       aspirin 325 MG tablet  Replaced by:  aspirin 81 MG chewable tablet     sildenafil 100 MG tablet  Commonly known as:  VIAGRA     simvastatin 40 MG tablet  Commonly known as:  ZOCOR  Replaced by:  atorvastatin 80 MG tablet     verapamil 180 MG CR tablet  Commonly known as:  CALAN-SR      TAKE these medications       acetaminophen 325 MG tablet  Commonly known as:  TYLENOL  Take 1-2 tablets (325-650 mg total) by mouth every 4 (four) hours as needed for mild pain.     aspirin 81 MG chewable tablet  Chew 1 tablet (81 mg total) by mouth daily.     atorvastatin 80 MG tablet  Commonly known as:  LIPITOR  Take 1 tablet (80 mg total) by mouth daily at 6 PM.     carvedilol 25 MG tablet  Commonly known as:  COREG  Take 1 tablet (25 mg total) by mouth 2 (two) times daily with a meal.     clopidogrel 75 MG tablet  Commonly known as:  PLAVIX  Take 1 tablet (75 mg total) by mouth daily.     dasatinib 100 MG tablet  Commonly known as:  SPRYCEL  Take 100 mg by mouth daily.     guaiFENesin 600 MG 12 hr tablet  Commonly known as:  MUCINEX  Take 600 mg by mouth 2 (two) times daily as needed for cough or to loosen phlegm.     levofloxacin 500 MG tablet  Commonly known as:  LEVAQUIN  Take  1 tablet (500 mg total) by mouth daily.     lisinopril 5 MG tablet  Commonly known as:  PRINIVIL,ZESTRIL  Take 1 tablet (5 mg total) by mouth daily.  nitroGLYCERIN 0.4 MG SL tablet  Commonly known as:  NITROSTAT  Place 1 tablet (0.4 mg total) under the tongue every 5 (five) minutes as needed for chest pain.     oxyCODONE-acetaminophen 5-325 MG per tablet  Commonly known as:  PERCOCET/ROXICET  Take 1-2 tablets by mouth every 4 (four) hours as needed for moderate pain.       Follow-up Information   Follow up with Virl Axe, MD On 06/04/2014. (for device clinic at 1430 pm)    Specialty:  Cardiology   Contact information:   [address] [place] [place] [address] [phone]       Follow up with Melina Copa, PA-C On 06/02/2014. (at 2:00 pm)    Specialty:  Cardiology   Contact information:   [address] [place] [address] [phone]       Follow up with Virl Axe, MD On 07/04/2014. (at 2:30 pm)    Specialty:  Cardiology   Contact information:   [address] [place] [address] [phone]       Follow up with Virl Axe, MD On 08/13/2014. (at 2:00pm)    Specialty:  Cardiology   Contact information:   [address] [place] [address] [phone]        Discharge Instructions: Heart Healthy Diet  No strenuous activity until seen with Melina Copa in the office.    Take 1 NTG, under your tongue, while sitting.  If no relief of pain may repeat NTG, one tab every 5 minutes up to 3 tablets total over 15 minutes.  If no relief CALL 911.  If you have dizziness/lightheadness  while taking NTG, stop taking and call 911.        No lifting over 5 pounds for 1 week.  Signed: Isaiah Serge Nurse Practitioner-Certified [facility]: [facility] 05/23/2014, 10:18 AM  Time spent on discharge : >30 minutes.

## 2014-05-23 NOTE — Discharge Instructions (Signed)
° ° °  Supplemental Discharge Instructions for  Pacemaker/Defibrillator Patients  Activity No heavy lifting or vigorous activity with your left/right arm for 6 to 8 weeks.  Do not raise your left arm above your head for one week.  Gradually raise your affected arm as drawn below.           __10/16/15                       05/25/14                    05/27/14                 05/30/14  NO DRIVING for  6 months    ; you may begin driving on  When Dr. Caryl Comes clears   .  WOUND CARE   Keep the wound area clean and dry.  Do not get this area wet for one week. No showers for one week; you may shower on 05/30/14    .   The tape/steri-strips on your wound will fall off; do not pull them off.  No bandage is needed on the site.  DO  NOT apply any creams, oils, or ointments to the wound area.  You do not have tape or steri strips.    If you notice any drainage or discharge from the wound, any swelling or bruising at the site, or you develop a fever > 101? F after you are discharged home, call the office at once.  Special Instructions   You are still able to use cellular telephones; use the ear opposite the side where you have your pacemaker/defibrillator.  Avoid carrying your cellular phone near your device.   When traveling through airports, show security personnel your identification card to avoid being screened in the metal detectors.  Ask the security personnel to use the hand wand.   Avoid arc welding equipment, MRI testing (magnetic resonance imaging), TENS units (transcutaneous nerve stimulators).  Call the office for questions about other devices.   Avoid electrical appliances that are in poor condition or are not properly grounded.   Microwave ovens are safe to be near or to operate.  Additional information for defibrillator patients should your device go off:   If your device goes off ONCE and you feel fine afterward, notify the device clinic nurses.   If your device goes off ONCE and you do not  feel well afterward, call 911.   If your device goes off TWICE, call 911.   If your device goes off THREE times in one day, call 911.  DO NOT DRIVE YOURSELF OR A FAMILY MEMBER WITH A DEFIBRILLATOR TO THE HOSPITAL--CALL 911.  Heart Healthy Diet  No strenuous activity until seen with Melina Copa in the office.    Take 1 NTG, under your tongue, while sitting.  If no relief of pain may repeat NTG, one tab every 5 minutes up to 3 tablets total over 15 minutes.  If no relief CALL 911.  If you have dizziness/lightheadness  while taking NTG, stop taking and call 911.        No lifting over 5 pounds for 1 week.  Complete antibiotics.

## 2014-05-23 NOTE — Progress Notes (Signed)
Discharge education, medication, prescriptions, and follow-up appts given to pt and family and reviewed, all stated understanding and that they had no questions, prescriptions given to pt, Medtronic box with pt, meds given to pt from pharmacy, incision site and ICD after care instructions given, IV and tele removed Rickard Rhymes, RN

## 2014-05-23 NOTE — Progress Notes (Signed)
CARE MANAGEMENT NOTE 05/23/2014  Patient:  IVEY, CINA   Account Number:  [id number]  Date Initiated:  05/19/2014  Documentation initiated by:  Elissa Hefty  Subjective/Objective Assessment:   adm w v Loralee Pacas     Action/Plan:   lives w wife, pcp dr Cletus Gash pickard   Anticipated DC Date:  05/23/2014   Anticipated DC Plan:  [place]  CM consult      Choice offered to / List presented to:             Status of service:  Completed, signed off Medicare Important Message given?  YES (If response is "NO", the following Medicare IM given date fields will be blank) Date Medicare IM given:  05/19/2014 Medicare IM given by:  Elissa Hefty Date Additional Medicare IM given:  05/22/2014 Additional Medicare IM given by:  [facility] DOWELL  Discharge Disposition:  HOME/SELF CARE  Per UR Regulation:  Reviewed for med. necessity/level of care/duration of stay  If discussed at [place] of Stay Meetings, dates discussed:   05/22/2014    Comments:  05/23/2014 1100 Chart reviewed. No NCM needs identified. Pt reports being able to pay for meds. Jonnie Finner RN CCM Case Mgmt phone [phone]

## 2014-05-26 NOTE — Telephone Encounter (Signed)
Patient contacted regarding discharge from [facility] on 05/23/2014.  Patient understands to follow up with provider Anthony Wolfe on 10/26 at 2 pm at [facility]. Patient understands discharge instructions? yes  Patient understands medications and regiment? yes Patient understands to bring all medications to this visit? yes   Pt states he is having some fatigue but not more than he would expect.  He is also c/o diarrhea on antibiotic but he is eating and drinking OK.  Advised to call back if further concerns/issues prior to his appt.  He states understanding.

## 2014-06-02 ENCOUNTER — Encounter: Payer: Self-pay | Admitting: Physician Assistant

## 2014-06-02 ENCOUNTER — Ambulatory Visit
Admission: RE | Admit: 2014-06-02 | Discharge: 2014-06-02 | Disposition: A | Discharge status: Home / Self Care | Payer: Commercial Managed Care - HMO | Source: Ambulatory Visit | Attending: Physician Assistant | Admitting: Physician Assistant

## 2014-06-02 ENCOUNTER — Encounter: Payer: Self-pay | Admitting: Internal Medicine

## 2014-06-02 ENCOUNTER — Ambulatory Visit (INDEPENDENT_AMBULATORY_CARE_PROVIDER_SITE_OTHER): Payer: Commercial Managed Care - HMO | Admitting: *Deleted

## 2014-06-02 ENCOUNTER — Telehealth: Payer: Self-pay | Admitting: *Deleted

## 2014-06-02 ENCOUNTER — Ambulatory Visit (INDEPENDENT_AMBULATORY_CARE_PROVIDER_SITE_OTHER): Payer: Commercial Managed Care - HMO | Admitting: Physician Assistant

## 2014-06-02 VITALS — BP 124/60 | HR 59 | Ht 69.0 in | Wt 194.4 lb

## 2014-06-02 DIAGNOSIS — I255 Ischemic cardiomyopathy: Secondary | ICD-10-CM

## 2014-06-02 DIAGNOSIS — I472 Ventricular tachycardia, unspecified: Secondary | ICD-10-CM

## 2014-06-02 DIAGNOSIS — I10 Essential (primary) hypertension: Secondary | ICD-10-CM

## 2014-06-02 DIAGNOSIS — E785 Hyperlipidemia, unspecified: Secondary | ICD-10-CM

## 2014-06-02 DIAGNOSIS — J90 Pleural effusion, not elsewhere classified: Secondary | ICD-10-CM

## 2014-06-02 DIAGNOSIS — R5383 Other fatigue: Secondary | ICD-10-CM

## 2014-06-02 DIAGNOSIS — I2510 Atherosclerotic heart disease of native coronary artery without angina pectoris: Secondary | ICD-10-CM

## 2014-06-02 DIAGNOSIS — R0600 Dyspnea, unspecified: Secondary | ICD-10-CM

## 2014-06-02 DIAGNOSIS — I214 Non-ST elevation (NSTEMI) myocardial infarction: Secondary | ICD-10-CM

## 2014-06-02 LAB — CBC WITH DIFFERENTIAL/PLATELET
Basophils Absolute: 0.1 10*3/uL (ref 0.0–0.1)
Basophils Relative: 0.7 % (ref 0.0–3.0)
EOS PCT: 2 % (ref 0.0–5.0)
Eosinophils Absolute: 0.1 10*3/uL (ref 0.0–0.7)
HCT: 34.1 % — ABNORMAL LOW (ref 39.0–52.0)
HEMOGLOBIN: 11.1 g/dL — AB (ref 13.0–17.0)
LYMPHS ABS: 1.7 10*3/uL (ref 0.7–4.0)
LYMPHS PCT: 23.9 % (ref 12.0–46.0)
MCHC: 32.7 g/dL (ref 30.0–36.0)
MCV: 95.9 fl (ref 78.0–100.0)
MONOS PCT: 8.1 % (ref 3.0–12.0)
Monocytes Absolute: 0.6 10*3/uL (ref 0.1–1.0)
NEUTROS ABS: 4.5 10*3/uL (ref 1.4–7.7)
Neutrophils Relative %: 65.3 % (ref 43.0–77.0)
PLATELETS: 249 10*3/uL (ref 150.0–400.0)
RBC: 3.56 Mil/uL — ABNORMAL LOW (ref 4.22–5.81)
RDW: 15.5 % (ref 11.5–15.5)
WBC: 7 10*3/uL (ref 4.0–10.5)

## 2014-06-02 LAB — MDC_IDC_ENUM_SESS_TYPE_INCLINIC
Battery Remaining Longevity: 137 mo
Battery Voltage: 3.13 V
Brady Statistic RV Percent Paced: 0.01 %
HIGH POWER IMPEDANCE MEASURED VALUE: 171 Ohm
HighPow Impedance: 46 Ohm
Lead Channel Impedance Value: 418 Ohm
Lead Channel Pacing Threshold Amplitude: 0.75 V
Lead Channel Sensing Intrinsic Amplitude: 7.75 mV
Lead Channel Setting Pacing Amplitude: 3.5 V
Lead Channel Setting Pacing Pulse Width: 0.4 ms
MDC IDC MSMT LEADCHNL RV PACING THRESHOLD PULSEWIDTH: 0.4 ms
MDC IDC MSMT LEADCHNL RV SENSING INTR AMPL: 6.875 mV
MDC IDC SESS DTM: 20151026152107
MDC IDC SET LEADCHNL RV SENSING SENSITIVITY: 0.3 mV
MDC IDC SET ZONE DETECTION INTERVAL: 340 ms
Zone Setting Detection Interval: 300 ms
Zone Setting Detection Interval: 370 ms

## 2014-06-02 LAB — TSH: TSH: 2.03 u[IU]/mL (ref 0.35–4.50)

## 2014-06-02 LAB — BASIC METABOLIC PANEL
BUN: 11 mg/dL (ref 6–23)
CALCIUM: 8.2 mg/dL — AB (ref 8.4–10.5)
CO2: 25 mEq/L (ref 19–32)
Chloride: 109 mEq/L (ref 96–112)
Creatinine, Ser: 1.2 mg/dL (ref 0.4–1.5)
GFR: 66.09 mL/min (ref >60.00)
Glucose, Bld: 91 mg/dL (ref 70–99)
Potassium: 3.7 mEq/L (ref 3.5–5.1)
Sodium: 141 mEq/L (ref 135–145)

## 2014-06-02 LAB — BRAIN NATRIURETIC PEPTIDE: Pro B Natriuretic peptide (BNP): 342 pg/mL — ABNORMAL HIGH (ref 0.0–100.0)

## 2014-06-02 MED ORDER — POTASSIUM CHLORIDE CRYS ER 20 MEQ PO TBCR: 20.0000 meq | EXTENDED_RELEASE_TABLET | Freq: Every day | ORAL | Status: DC

## 2014-06-02 MED ORDER — FUROSEMIDE 40 MG PO TABS: 40.0000 mg | ORAL_TABLET | Freq: Every day | ORAL | Status: DC

## 2014-06-02 NOTE — Patient Instructions (Signed)
CHEST X-RAY TODAY AT [facility]  LAB WORK TODAY; BMET, CBC W/DIFF, TSH, BNP  YOU WILL NEED TO HAVE FASTING LIPID AND CMET WHEN YOU SEE DR. KLEIN ON 07/04/14  NO CHANGES WERE MADE WITH MEDICATIONS AT TODAY'S VISIT.

## 2014-06-02 NOTE — Progress Notes (Signed)
Wound check appointment. Wound without redness or edema. Incision edges approximated, wound well healed. Normal device function. Threshold, sensing, and impedances consistent with implant measurements. Device programmed at 3.5V for extra safety margin until 3 month visit. Histogram distribution appropriate for patient and level of activity. No ventricular arrhythmias noted. Patient educated about wound care, arm mobility, lifting restrictions, shock plan. ROV with SK on 11/27 per Cecilie Kicks, NP.

## 2014-06-02 NOTE — Progress Notes (Signed)
[place], [place], [place]  [address] Phone: [phone] Fax:  [phone]  Date:  06/02/2014   Patient ID:  Anthony Wolfe, DOB 10/22/1945, MRN [id number]   PCP:  Odette Fraction, MD  Cardiologist:  Radford Pax Electrophysiologist: Caryl Comes  History of Present Illness: Anthony Wolfe is a 68 y.o. male with history of CML, CAD (s/p CABG 2010, recent NSTEMI 05/2014), ischemic cardiomyopathy, HTN, HLD, remote long tobacco abuse with emphysema, carotid disease and recently diagnosed VT s/p ICD who presents for hospital follow-up. Prior EF in 2014 was 40%. He had an episode of nausea/weakness the day of admission while hunting and EMS was called. He was found to be in VT at 235 bpm requiring amiodarone/DCCV in the ambulance (no LOC). His troponin rose to 11 and he underwent cath showing occluded SVG to RCA, right to right collaterals and collaterals from septal and OM, EF 40% with inferior HK (Dr. Johnsie Cancel suspected recent IMI). 2D Echo 05/17/14 showed EF 35-40% with severe inferior HK, grade 2 d/d, mod dilated LA, mildly dilated RA, mildly reduced RV function with normal size, mildly dilated RA. Medical therapy was recommended for his CAD. EP was consulted who recommended EPS - this showed inducible sustained monomorphic VT and he subsequently underwent placement of a single chamber Medtronic ICD. Levaquin was added for pneumonia and he was instructed not to drive for 6 months. Per review of in-hospital AVS, he was started on multiple medications including Plavix, Coreg, atorvastatin (switched from simva), and lisinopril. His aspirin was changed from 325mg  to 81mg . The meds that were stopped include sildenafil, verapamil, and simvastatin. He completed his course of abx as an outpatient.  He comes in today saying he generally feels lousy. He felt good after hospital discharge but has had good days and bad days since then. He denies any palpitations, recurrence of near-syncope, or ICD discharge. He  just has no energy and c/o orthopnea. He also notices some DOE but this has fluctuated over the years. No chest pain. Weight is up 2 lbs from discharge. He does not really participate in much activity. He says he doesn't add salt to anything but it doesn't really sound like he is factoring in the salt from foods.  Recent Labs: 08/12/2013: HDL Cholesterol by NMR 27*; LDL (calc) 67  04/29/2014: ALT <6  05/20/2014: Creatinine 1.02; Potassium 3.9  05/21/2014: Hemoglobin 10.8*   Wt Readings from Last 3 Encounters:  06/02/14 194 lb 6.4 oz (88.179 kg)  05/22/14 192 lb 14.4 oz (87.499 kg)  05/22/14 192 lb 14.4 oz (87.499 kg)     Past Medical History  Diagnosis Date  . Hyperlipidemia   . HTN (hypertension)   . Allergic rhinitis   . Prostate hypertrophy   . Anxiety   . CAD (coronary artery disease)     a. s/p 3v CABG 2010. b. NSTEMI 05/2014 in setting of VT. Cath impressions: "Recent IMI with occluded SVG to RCA. Has Right to Right collaterals and collaterals from septal and OM. EF 40%."  . TIA (transient ischemic attack)     ASPVD, S./P. right CEA, 2002, and known occluded left carotid  . 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 Ischemic cardiomyopathy     a. Prior EF 36%. b. 2014: 50%. c. 05/2014: EF 35-40% by echo, 40% with inf HK by cath.  . Carotid disease, bilateral     a. Right CEA 2002; known occluded left carotid.  . Depression   . PVC's (premature ventricular contractions)  a. PVC's with prior Holter showing PVC load of 22%.  . 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 Pneumonia 05/23/2014  . Ventricular tachycardia     a. Admitted with VT 05/2013 - EPS with inducible sustained monomorphic VT; s/p Medtronic ICD 05/21/14.  . Emphysema of lung     Current Outpatient Prescriptions  Medication Sig Dispense Refill  . acetaminophen (TYLENOL) 325 MG tablet Take 1-2 tablets (325-650 mg total) by mouth every 4 (four) hours as needed for mild pain.      Marland Kitchen aspirin 81 MG  chewable tablet Chew 1 tablet (81 mg total) by mouth daily.      Marland Kitchen atorvastatin (LIPITOR) 80 MG tablet Take 1 tablet (80 mg total) by mouth daily at 6 PM.  30 tablet  0  . carvedilol (COREG) 25 MG tablet Take 1 tablet (25 mg total) by mouth 2 (two) times daily with a meal.  60 tablet  0  . clopidogrel (PLAVIX) 75 MG tablet Take 1 tablet (75 mg total) by mouth daily.  30 tablet  0  . dasatinib (SPRYCEL) 100 MG tablet Take 100 mg by mouth daily.      Marland Kitchen guaiFENesin (MUCINEX) 600 MG 12 hr tablet Take 600 mg by mouth 2 (two) times daily as needed for cough or to loosen phlegm.      Marland Kitchen lisinopril (PRINIVIL,ZESTRIL) 5 MG tablet Take 1 tablet (5 mg total) by mouth daily.  30 tablet  0  . nitroGLYCERIN (NITROSTAT) 0.4 MG SL tablet Place 1 tablet (0.4 mg total) under the tongue every 5 (five) minutes as needed for chest pain.  25 tablet  4  . oxyCODONE-acetaminophen (PERCOCET/ROXICET) 5-325 MG per tablet Take 1-2 tablets by mouth every 4 (four) hours as needed for moderate pain.  30 tablet  0   No current facility-administered medications for this visit.    Allergies:   Norvasc and Sulfa antibiotics   Social History:  The patient  reports that he quit smoking about 16 years ago. He has quit using smokeless tobacco. He reports that he does not drink alcohol or use illicit drugs.   Family History:  The patient's family history includes Alcohol abuse in his father; Heart disease in his mother.   ROS:  Please see the history of present illness.  All other systems reviewed and negative.   PHYSICAL EXAM:  VS:  BP 124/60  Pulse 59  Ht 5\' 9"  (1.753 m)  Wt 194 lb 6.4 oz (88.179 kg)  BMI 28.69 kg/m2 Well nourished, well developed WM, in no acute distress HEENT: normal Neck: no JVD Cardiac:  normal S1, S2; RRR; no murmur, left upper chest wall device site with minor ecchymosis but no suppuration or hematoma Lungs:  markedly diminished BS on the R, L lung without crackles/wheezes/rhonchi Abd: soft,  nontender, no hepatomegaly Ext: no edema, R femoral cath site without ecchymosis/hematoma/bruit, L femoral procedure site with moderate focal ecchymosis that appears in middle stages of resolution with yellowing of bruise - no hematoma or bruit Skin: warm and dry Neuro:  moves all extremities spontaneously, no focal abnormalities noted  EKG:  Sinus bradycardia with first degree AVB (PR 272ms), incomplete LBBB, QRS 118, QTc 451, TWI II, avF     ASSESSMENT AND PLAN:  1. Ventricular tachycardia - quiescent. He was due to come back to device clinic in 2 days, but for convenience's sake, they were able to squeeze him in today while he was here. Interrogation revealed no interim arrhythmias. 2. Ischemic  cardiomyopathy with suspicion of acute on chronic systolic CHF - he is describing DOE, orthopnea, and fatigue. Weight is up 2 lbs. I suspect component of CHF. However, his right lung is not moving much air. Given recent PNA, will recheck CXR as well as BNP. Given recent initiation of ACEI, will recheck BMET. Due to fatigue, will also check TSH and CBC. (He has h/o CML and is on dasatinib.) Based on these findings I will consider addition of Lasix. With his longstanding remote tobacco use and COPD we could also consider switching Coreg to metoprolol/bisoprolol for its beta selectivity. Salt/fluid restriction discussed with patient.  3. CAD s/p CABG in 2010, NSTEMI 05/2014 treated medically based on cath findings - continue aspirin, Plavix, statin, beta blocker. Will defer ultimate duration of Plavix to primary cardiologist in follow-up.  4. HTN - controlled. 5. Hyperlipidemia - statin was advanced during recent admission. Check CMET/lipids at next OV 07/04/14. 6. COPD (emphysema) with abnormal lung sounds - f/u CXR today. He completed a course of pneumonia but lung sounds are still quite diminished in the right lung base. 7. Carotid artery disease - appears that PCP has been following based on Epic order. If  labs or CXR do not reveal marked abnormalities, will ask him to update study.  Dispo: F/u as planned with Dr. Caryl Comes 07/04/14.  Signed, Melina Copa, PA-C  06/02/2014 2:59 PM

## 2014-06-02 NOTE — Telephone Encounter (Signed)
pt gave me permission to s/w his wife. Wife has been notified of lab and cxr results. Wife aware Rx for lasix 40 mg and K+ 20 meq sent into [facility], BMET 1 wk. Per Melina Copa, PA to come in this Friday 10/30 to see her. I will send message to Neil Crouch to call pt with the time of appt due to Melina Copa, PA is flex 10/30 and she asked for pt to come in that day. Wife verbalized understanding to Plan of Care and all instructions.

## 2014-06-03 ENCOUNTER — Telehealth: Payer: Self-pay | Admitting: Internal Medicine

## 2014-06-03 NOTE — Telephone Encounter (Signed)
New problem    Pt stated his prescription hasn't been called in for lasix and potassium medication/[facility]/[address]. Please advise

## 2014-06-04 ENCOUNTER — Ambulatory Visit: Payer: Commercial Managed Care - HMO

## 2014-06-04 ENCOUNTER — Telehealth: Payer: Self-pay | Admitting: Internal Medicine

## 2014-06-04 MED ORDER — METOPROLOL TARTRATE 100 MG PO TABS: 100.0000 mg | ORAL_TABLET | Freq: Two times a day (BID) | ORAL | Status: DC

## 2014-06-04 NOTE — Telephone Encounter (Signed)
At our office visit on Monday, the patient and his wife did not mention any symptoms of diarrhea nor did this come up on review of systems. None of the heart medications that were started in the hospital that are really known to have this side effect. We talked about possibility of changing the Coreg (due to his COPD) if his chest x-ray was unrevealing, but his chest x-ray did show a moderate pleural effusion so that's why Arbie Cookey called them on Monday evening to have him start Lasix and potassium. When Arbie Cookey called him with his lab results and chest x-ray, I had Arbie Cookey let them know that the pleural effusion could be the cause of his SOB and weakness. Please make sure he has started these medications. If not, I don't think he's going to feel any different today compared to Monday. If so - and still feeling poorly - we can try discontinuing Coreg and starting Metoprolol tartrate 100mg  BID (this is the equivalent dose).  Regardless, for his diarrhea, he needs to see his PCP or urgent care TODAY. He recently finished a course of antibiotics from the hospital and will need to be checked out for C-diff infection.   Julaine Hua sent a message to Neil Crouch to have him return on Friday for a follow-up appointment with me from a cardiac standpoint. Please make sure this is scheduled. He is welcome to proceed to ED if symptoms worsen. Thank you!!  Melina Copa PA-C

## 2014-06-04 NOTE — Telephone Encounter (Signed)
Pt's wife called this AM, pt was seen by Melina Copa PA on 10 /26/15; and he C/O of diarrhea, SOB and weakness wife states Dayna supposed to call pt back after she talks  with Dr. Caryl Comes to see if a medication he is taking is causing these symptoms. Wife said that she thinks that is Coreg is causing these symptoms. Pt continues having  the same symptoms. Pt states that  If Dr. Caryl Comes  does not  Change  the Coreg to another medication he will stop taking the Coreg, because the symptoms are getting worse. Pt's wife is aware that this message will be send to the PA and Dr. Caryl Comes for recommendations.

## 2014-06-04 NOTE — Telephone Encounter (Signed)
New problem   Pt's wife stated when pt came in office to see Sharrell Ku and told her he had diarrhea/weak/no energy, Pt's wife stated Hinton Dyer was suppose to get back with them after talking to Dr Caryl Comes, but they haven't heard anything. Please advise pt what to do. They was told it might be one of his medications.

## 2014-06-04 NOTE — Telephone Encounter (Signed)
Pt and wife are aware of PA's recommendations. Pt has been taking lasix but not potassium, because pt had Lasix 40 mg left from before, but not potassium. Pt is aware that the prescriptions for Lasix and potassium were sent to [facility] in [place] on 10/26. Wife states that pt is still feeling weak and SOB. She is aware to discontinue Coreg and start Metoprolol tartrate 100 mg twice a day. Prescription send to [facility] in [place].Pt will start taking potassium with the lasix today. Pt is aware of appointment with PA on Friday 10 /30/15 at 11:30 AM.

## 2014-06-06 ENCOUNTER — Encounter: Payer: Self-pay | Admitting: Physician Assistant

## 2014-06-06 ENCOUNTER — Ambulatory Visit (INDEPENDENT_AMBULATORY_CARE_PROVIDER_SITE_OTHER): Payer: Commercial Managed Care - HMO | Admitting: Physician Assistant

## 2014-06-06 VITALS — BP 112/52 | HR 56 | Temp 98.3°F | Ht 69.0 in | Wt 193.8 lb

## 2014-06-06 DIAGNOSIS — I2510 Atherosclerotic heart disease of native coronary artery without angina pectoris: Secondary | ICD-10-CM

## 2014-06-06 DIAGNOSIS — J90 Pleural effusion, not elsewhere classified: Secondary | ICD-10-CM | POA: Insufficient documentation

## 2014-06-06 DIAGNOSIS — I739 Peripheral vascular disease, unspecified: Secondary | ICD-10-CM

## 2014-06-06 DIAGNOSIS — E785 Hyperlipidemia, unspecified: Secondary | ICD-10-CM

## 2014-06-06 DIAGNOSIS — J948 Other specified pleural conditions: Secondary | ICD-10-CM

## 2014-06-06 DIAGNOSIS — I472 Ventricular tachycardia, unspecified: Secondary | ICD-10-CM

## 2014-06-06 DIAGNOSIS — J439 Emphysema, unspecified: Secondary | ICD-10-CM | POA: Insufficient documentation

## 2014-06-06 DIAGNOSIS — I779 Disorder of arteries and arterioles, unspecified: Secondary | ICD-10-CM

## 2014-06-06 DIAGNOSIS — R197 Diarrhea, unspecified: Secondary | ICD-10-CM

## 2014-06-06 DIAGNOSIS — I5023 Acute on chronic systolic (congestive) heart failure: Secondary | ICD-10-CM | POA: Insufficient documentation

## 2014-06-06 DIAGNOSIS — I255 Ischemic cardiomyopathy: Secondary | ICD-10-CM

## 2014-06-06 NOTE — Patient Instructions (Addendum)
Your physician recommends that you continue on your current medications as directed. Please refer to the Current Medication list given to you today.    Your physician has requested that you have a carotid duplex. This test is an ultrasound of the carotid arteries in your neck. It looks at blood flow through these arteries that supply the brain with blood. Allow one hour for this exam. There are no restrictions or special instructions.   Your physician recommends that you schedule a follow-up appointment in:  [place] APP NEXT Thursday OR Friday OF NEXT WEEK    Your physician recommends that you return for lab work in:  BMET  NEXT WEEK Thursday OR Friday  WITH APPT  WITH DR TURNER OR AVAIABLE APP

## 2014-06-06 NOTE — Progress Notes (Addendum)
[place] [place], [place]  [address] Phone: [phone] Fax:  [phone]  Date:  06/06/2014   Patient ID:  Anthony Wolfe, DOB 04/14/1946, MRN [id number]   PCP:  Odette Fraction, MD  Cardiologist: Radford Pax  Electrophysiologist: Caryl Comes    History of Present Illness: Anthony Wolfe is a 68 y.o. male with history of CML, CAD (s/p CABG 2010, recent NSTEMI 05/2014), ischemic cardiomyopathy (EF 40% in 2014, EF 35-40% 05/2014), HTN, HLD, remote long tobacco abuse with emphysema, carotid disease and recently diagnosed VT s/p ICD who presents for close followup. I saw him in clinic on 06/02/14 following an admission for VT.   He was admitted 05/16/14-05/23/14. He had had an episode of nausea/weakness the day of admission while hunting and EMS was called. He was found to be in VT at 235 bpm requiring amiodarone/DCCV in the ambulance (no LOC). His troponin rose to 11 and he underwent cath showing occluded SVG to RCA, right to right collaterals and collaterals from septal and OM, EF 40% with inferior HK (Dr. Johnsie Cancel suspected recent IMI). 2D Echo 05/17/14 showed EF 35-40% with severe inferior HK, grade 2 d/d, mod dilated LA, mildly dilated RA, mildly reduced RV function with normal size, mildly dilated RA. Medical therapy was recommended for his CAD. EP was consulted who recommended EPS - this showed inducible sustained monomorphic VT and he subsequently underwent placement of a single chamber Medtronic ICD. Levaquin was added for pneumonia and he was instructed not to drive for 6 months. Per review of in-hospital AVS, he was started on multiple medications that he was previously not on, including Plavix, Coreg 25mg  BID, atorvastatin (switched from simva), and lisinopril. His aspirin was changed from 325mg  to 81mg . The meds that were stopped include sildenafil, verapamil, and simvastatin. He completed his course of abx as an outpatient.  When I saw him in clinic on 06/02/14, he was feeling  "lousy." He had felt good after hospital discharge but had had good days and bad days since then. He c/o orthopnea and no energy. Weight was up 2lbs from discharge. Lung exam revealed markedly diminished R BS so I obtained a CXR showing a moderate right pleural effusion increased from 05/22/14; previous airspace improved but not completely resolved, and f/u of clearing recommended to exclude underlying malingnancy. Labs showed relatively stable BMET, CBC c/w prior, normal TSH and PBNP 342. We called in Lasix 40mg  and potassium 65meq. The patient called in two days later because he was still feeling poorly. He had started Lasix the day before (initially doubling up on prior 20mg  tablets), but had not picked up his potassium yet. He also wanted to know what to do about his diarrhea which he had not endorsed at the previous office visit. I advised he f/u PCP for his diarrhea given recent abx use. I also changed his Coreg to equivalent dose of metoprolol for its selectivity given his significant history of emphysema and substantial dose size. He was advised to start KCl ASAP.  He comes in today feeling somewhat better. He has had no further diarrhea so he did not have to see his PCP. Of note, I see a C-diff PCR obtained during his hospitalization that was negative. He and his wife think his breathing is improving. He still has orthopnea but has only had 3 days of Lasix so far. No chest pain. His appetite has been poor since hospital discharge. Abdomen is not distended. Weight is down 1 lb. His wife asked if  drinking more water with his Lasix would help, to which I reiterated salt/fluid restriction as previously discussed.  Recent Labs: 08/12/2013: HDL Cholesterol by NMR 27*; LDL (calc) 67  04/29/2014: ALT <6  06/02/2014: Creatinine 1.2; Hemoglobin 11.1*; Potassium 3.7; Pro B Natriuretic peptide (BNP) 342.0*; TSH 2.03   Wt Readings from Last 3 Encounters:  06/06/14 193 lb 12.8 oz (87.907 kg)  06/02/14 194 lb  6.4 oz (88.179 kg)  05/22/14 192 lb 14.4 oz (87.499 kg)     Past Medical History  Diagnosis Date  . Hyperlipidemia   . HTN (hypertension)   . Allergic rhinitis   . Prostate hypertrophy   . Anxiety   . CAD (coronary artery disease)     a. s/p 3v CABG 2010. b. NSTEMI 05/2014 in setting of VT. Cath impressions: "Recent IMI with occluded SVG to RCA. Has Right to Right collaterals and collaterals from septal and OM. EF 40%."  . TIA (transient ischemic attack)     ASPVD, S./P. right CEA, 2002, and known occluded left carotid  . 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 Ischemic cardiomyopathy     a. Prior EF 36%. b. 2014: 50%. c. 05/2014: EF 35-40% by echo, 40% with inf HK by cath.  . Carotid disease, bilateral     a. Right CEA 2002; known occluded left carotid.  . Depression   . PVC's (premature ventricular contractions)     a. PVC's with prior Holter showing PVC load of 22%.  . 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 Pneumonia 05/23/2014  . Ventricular tachycardia     a. Admitted with VT 05/2013 - EPS with inducible sustained monomorphic VT; s/p Medtronic ICD 05/21/14.  . Emphysema of lung     Current Outpatient Prescriptions  Medication Sig Dispense Refill  . acetaminophen (TYLENOL) 325 MG tablet Take 1-2 tablets (325-650 mg total) by mouth every 4 (four) hours as needed for mild pain.      Marland Kitchen aspirin 81 MG chewable tablet Chew 1 tablet (81 mg total) by mouth daily.      Marland Kitchen atorvastatin (LIPITOR) 80 MG tablet Take 1 tablet (80 mg total) by mouth daily at 6 PM.  30 tablet  0  . clopidogrel (PLAVIX) 75 MG tablet Take 1 tablet (75 mg total) by mouth daily.  30 tablet  0  . dasatinib (SPRYCEL) 100 MG tablet Take 100 mg by mouth daily.      . furosemide (LASIX) 40 MG tablet Take 1 tablet (40 mg total) by mouth daily.  30 tablet  11  . guaiFENesin (MUCINEX) 600 MG 12 hr tablet Take 600 mg by mouth 2 (two) times daily as needed for cough or to loosen phlegm.      Marland Kitchen lisinopril  (PRINIVIL,ZESTRIL) 5 MG tablet Take 1 tablet (5 mg total) by mouth daily.  30 tablet  0  . metoprolol (LOPRESSOR) 100 MG tablet Take 1 tablet (100 mg total) by mouth 2 (two) times daily.  60 tablet  6  . nitroGLYCERIN (NITROSTAT) 0.4 MG SL tablet Place 1 tablet (0.4 mg total) under the tongue every 5 (five) minutes as needed for chest pain.  25 tablet  4  . potassium chloride SA (K-DUR,KLOR-CON) 20 MEQ tablet Take 1 tablet (20 mEq total) by mouth daily.  30 tablet  11   No current facility-administered medications for this visit.    Allergies:   Norvasc and Sulfa antibiotics   Social History:  The patient  reports that he quit smoking about  16 years ago. He has quit using smokeless tobacco. He reports that he does not drink alcohol or use illicit drugs.   Family History:  The patient's family history includes Alcohol abuse in his father; Heart disease in his mother.   ROS:  Please see the history of present illness.      All other systems reviewed and negative.   PHYSICAL EXAM:  VS:  BP 112/52  Pulse 56  Temp(Src) 98.3 F (36.8 C)  Ht 5\' 9"  (1.753 m)  Wt 193 lb 12.8 oz (87.907 kg)  BMI 28.61 kg/m2  SpO2 96% Well nourished, well developed, in no acute distress HEENT: normal  Neck: no JVD  Cardiac: normal S1, S2; RRR; no murmur Lungs: moderately diminished BS on the R with slightly improved aeration, L lung without crackles/wheezes/rhonchi  Abd: soft, nontender, no hepatomegaly  Ext: no edema Skin: warm and dry  Neuro: moves all extremities spontaneously, no focal abnormalities noted   ASSESSMENT AND PLAN:  1. Moderate right pleural effusion, possibly due to acute on chronic systolic CHF/ICM - slowly improving. WBC was normal and he has completed antibiotics so I don't think there is residual pneumonia. I think this is most likely CHF. Continue Lasix and potassium. Weight down 1 lb. Fluid restriction/salt restriction again discussed. If symptoms do not resolve and CXR does not  improve, may have to do a CT to get a better look at the lung fields to exclude underlying malignancy. May also consider thoracentesis for both therapeutic and diagnostic reasons. His initial CXR 10/9 did not mention any pulmonary abnormality. 2. COPD/emphysema - doing better after switch from Coreg to metoprolol. 3. Diarrhea - resolved. C diff in the hospital was negative. If decreased appetite persists I told him he will need to f/u PCP for further evaluation. 4. CAD s/p CABG in 2010, NSTEMI 05/2014 treated medically based on cath findings - continue aspirin, Plavix, statin, beta blocker. Will defer ultimate duration of Plavix to primary cardiologist in follow-up. 5. Hyperlipidemia - statin was advanced during recent admission. Check CMET/lipids at [facility] 07/04/14.  6. Carotid artery disease - he is overdue (last in 11/2012) for his duplex. Schedule updated dopplers. 7. Ventricular tachycardia - s/p recent ICD implantation with normal interrogation on 06/02/14. F/u EP as planned.  Dispo: F/u 1 week with Dr. Radford Pax or APP. He also has f/u end of November with Dr. Caryl Comes.  Signed, Melina Copa, PA-C  06/06/2014 11:36 AM

## 2014-06-09 ENCOUNTER — Observation Stay (HOSPITAL_COMMUNITY)
Admission: AD | Admit: 2014-06-09 | Discharge: 2014-06-10 | Disposition: A | Discharge status: Home / Self Care | Payer: Commercial Managed Care - HMO | Source: Ambulatory Visit | Attending: Cardiology | Admitting: Cardiology

## 2014-06-09 ENCOUNTER — Ambulatory Visit (INDEPENDENT_AMBULATORY_CARE_PROVIDER_SITE_OTHER): Payer: Commercial Managed Care - HMO | Admitting: Physician Assistant

## 2014-06-09 ENCOUNTER — Observation Stay (HOSPITAL_COMMUNITY): Payer: Commercial Managed Care - HMO

## 2014-06-09 ENCOUNTER — Other Ambulatory Visit: Payer: Self-pay | Admitting: Physician Assistant

## 2014-06-09 ENCOUNTER — Encounter (HOSPITAL_COMMUNITY): Payer: Self-pay | Admitting: General Practice

## 2014-06-09 ENCOUNTER — Encounter: Payer: Self-pay | Admitting: Physician Assistant

## 2014-06-09 VITALS — BP 116/72 | HR 60 | Ht 69.0 in | Wt 188.0 lb

## 2014-06-09 DIAGNOSIS — J948 Other specified pleural conditions: Secondary | ICD-10-CM

## 2014-06-09 DIAGNOSIS — J90 Pleural effusion, not elsewhere classified: Secondary | ICD-10-CM

## 2014-06-09 DIAGNOSIS — Z951 Presence of aortocoronary bypass graft: Secondary | ICD-10-CM | POA: Insufficient documentation

## 2014-06-09 DIAGNOSIS — I2510 Atherosclerotic heart disease of native coronary artery without angina pectoris: Secondary | ICD-10-CM | POA: Insufficient documentation

## 2014-06-09 DIAGNOSIS — I472 Ventricular tachycardia, unspecified: Secondary | ICD-10-CM

## 2014-06-09 DIAGNOSIS — R0602 Shortness of breath: Principal | ICD-10-CM | POA: Diagnosis present

## 2014-06-09 DIAGNOSIS — I25810 Atherosclerosis of coronary artery bypass graft(s) without angina pectoris: Secondary | ICD-10-CM

## 2014-06-09 DIAGNOSIS — Z9889 Other specified postprocedural states: Secondary | ICD-10-CM | POA: Insufficient documentation

## 2014-06-09 DIAGNOSIS — I255 Ischemic cardiomyopathy: Secondary | ICD-10-CM

## 2014-06-09 DIAGNOSIS — I10 Essential (primary) hypertension: Secondary | ICD-10-CM

## 2014-06-09 HISTORY — DX: Non-ST elevation (NSTEMI) myocardial infarction: I21.4

## 2014-06-09 HISTORY — DX: Pleural effusion, not elsewhere classified: J90

## 2014-06-09 LAB — BODY FLUID CELL COUNT WITH DIFFERENTIAL
EOS FL: 0 %
Lymphs, Fluid: 90 %
Monocyte-Macrophage-Serous Fluid: 8 % — ABNORMAL LOW (ref 50–90)
Neutrophil Count, Fluid: 2 % (ref 0–25)
Total Nucleated Cell Count, Fluid: 517 cu mm (ref 0–1000)

## 2014-06-09 LAB — COMPREHENSIVE METABOLIC PANEL
ALBUMIN: 3.3 g/dL — AB (ref 3.5–5.2)
ALT: 11 U/L (ref 0–53)
AST: 17 U/L (ref 0–37)
Alkaline Phosphatase: 86 U/L (ref 39–117)
Anion gap: 16 — ABNORMAL HIGH (ref 5–15)
BUN: 17 mg/dL (ref 6–23)
CALCIUM: 9 mg/dL (ref 8.4–10.5)
CHLORIDE: 105 meq/L (ref 96–112)
CO2: 24 meq/L (ref 19–32)
CREATININE: 1.25 mg/dL (ref 0.50–1.35)
GFR calc Af Amer: 66 mL/min — ABNORMAL LOW (ref >90)
GFR calc non Af Amer: 57 mL/min — ABNORMAL LOW (ref >90)
Glucose, Bld: 101 mg/dL — ABNORMAL HIGH (ref 70–99)
Potassium: 4.1 mEq/L (ref 3.7–5.3)
Sodium: 145 mEq/L (ref 137–147)
Total Bilirubin: 0.6 mg/dL (ref 0.3–1.2)
Total Protein: 6.6 g/dL (ref 6.0–8.3)

## 2014-06-09 LAB — CBC WITH DIFFERENTIAL/PLATELET
BASOS PCT: 0 % (ref 0–1)
Basophils Absolute: 0 10*3/uL (ref 0.0–0.1)
EOS PCT: 1 % (ref 0–5)
Eosinophils Absolute: 0.1 10*3/uL (ref 0.0–0.7)
HEMATOCRIT: 37.3 % — AB (ref 39.0–52.0)
Hemoglobin: 12.2 g/dL — ABNORMAL LOW (ref 13.0–17.0)
LYMPHS PCT: 24 % (ref 12–46)
Lymphs Abs: 1.4 10*3/uL (ref 0.7–4.0)
MCH: 31.9 pg (ref 26.0–34.0)
MCHC: 32.7 g/dL (ref 30.0–36.0)
MCV: 97.4 fL (ref 78.0–100.0)
MONO ABS: 0.4 10*3/uL (ref 0.1–1.0)
Monocytes Relative: 7 % (ref 3–12)
NEUTROS ABS: 4 10*3/uL (ref 1.7–7.7)
Neutrophils Relative %: 68 % (ref 43–77)
PLATELETS: 148 10*3/uL — AB (ref 150–400)
RBC: 3.83 MIL/uL — ABNORMAL LOW (ref 4.22–5.81)
RDW: 15.4 % (ref 11.5–15.5)
WBC: 6 10*3/uL (ref 4.0–10.5)

## 2014-06-09 LAB — PROTEIN, BODY FLUID: Total protein, fluid: 3.7 g/dL

## 2014-06-09 LAB — PROTIME-INR
INR: 1.32 (ref 0.00–1.49)
PROTHROMBIN TIME: 16.5 s — AB (ref 11.6–15.2)

## 2014-06-09 LAB — APTT: APTT: 32 s (ref 24–37)

## 2014-06-09 LAB — LACTATE DEHYDROGENASE, PLEURAL OR PERITONEAL FLUID: LD, Fluid: 108 U/L — ABNORMAL HIGH (ref 3–23)

## 2014-06-09 LAB — GLUCOSE, SEROUS FLUID: Glucose, Fluid: 100 mg/dL

## 2014-06-09 LAB — LACTATE DEHYDROGENASE: LDH: 121 U/L (ref 94–250)

## 2014-06-09 LAB — PRO B NATRIURETIC PEPTIDE: PRO B NATRI PEPTIDE: 2643 pg/mL — AB (ref 0–125)

## 2014-06-09 MED ORDER — SODIUM CHLORIDE 0.9 % IJ SOLN
3.0000 mL | Freq: Two times a day (BID) | INTRAMUSCULAR | Status: DC
  Administered 2014-06-09 – 2014-06-10 (×2): 3 mL via INTRAVENOUS

## 2014-06-09 MED ORDER — SODIUM CHLORIDE 0.9 % IV SOLN: 250.0000 mL | INTRAVENOUS | Status: DC | PRN

## 2014-06-09 MED ORDER — LISINOPRIL 5 MG PO TABS
5.0000 mg | ORAL_TABLET | Freq: Every day | ORAL | Status: DC
  Administered 2014-06-10: 5 mg via ORAL
  Filled 2014-06-09: qty 1

## 2014-06-09 MED ORDER — DASATINIB 50 MG PO TABS: 100.0000 mg | ORAL_TABLET | Freq: Every day | ORAL | Status: DC

## 2014-06-09 MED ORDER — CLOPIDOGREL BISULFATE 75 MG PO TABS
75.0000 mg | ORAL_TABLET | Freq: Every day | ORAL | Status: DC
  Administered 2014-06-10: 75 mg via ORAL
  Filled 2014-06-09: qty 1

## 2014-06-09 MED ORDER — FUROSEMIDE 40 MG PO TABS
40.0000 mg | ORAL_TABLET | Freq: Every day | ORAL | Status: DC
  Administered 2014-06-10: 40 mg via ORAL
  Filled 2014-06-09: qty 1

## 2014-06-09 MED ORDER — ACETAMINOPHEN 325 MG PO TABS: 650.0000 mg | ORAL_TABLET | ORAL | Status: DC | PRN

## 2014-06-09 MED ORDER — POTASSIUM CHLORIDE CRYS ER 20 MEQ PO TBCR
20.0000 meq | EXTENDED_RELEASE_TABLET | Freq: Every day | ORAL | Status: DC
  Administered 2014-06-10: 20 meq via ORAL
  Filled 2014-06-09: qty 1

## 2014-06-09 MED ORDER — ATORVASTATIN CALCIUM 80 MG PO TABS
80.0000 mg | ORAL_TABLET | Freq: Every day | ORAL | Status: DC
  Administered 2014-06-09: 80 mg via ORAL
  Filled 2014-06-09: qty 1

## 2014-06-09 MED ORDER — ASPIRIN EC 81 MG PO TBEC
81.0000 mg | DELAYED_RELEASE_TABLET | Freq: Every day | ORAL | Status: DC
  Administered 2014-06-10: 81 mg via ORAL
  Filled 2014-06-09: qty 1

## 2014-06-09 MED ORDER — SODIUM CHLORIDE 0.9 % IJ SOLN: 3.0000 mL | INTRAMUSCULAR | Status: DC | PRN

## 2014-06-09 MED ORDER — METOPROLOL TARTRATE 100 MG PO TABS
100.0000 mg | ORAL_TABLET | Freq: Two times a day (BID) | ORAL | Status: DC
  Administered 2014-06-09 – 2014-06-10 (×2): 100 mg via ORAL
  Filled 2014-06-09 (×2): qty 1

## 2014-06-09 MED ORDER — GUAIFENESIN ER 600 MG PO TB12
600.0000 mg | ORAL_TABLET | Freq: Two times a day (BID) | ORAL | Status: DC
  Administered 2014-06-09 – 2014-06-10 (×2): 600 mg via ORAL
  Filled 2014-06-09 (×2): qty 1

## 2014-06-09 MED ORDER — ONDANSETRON HCL 4 MG/2ML IJ SOLN: 4.0000 mg | Freq: Four times a day (QID) | INTRAMUSCULAR | Status: DC | PRN

## 2014-06-09 NOTE — Telephone Encounter (Signed)
New message    Wife calling patient C/O sob.

## 2014-06-09 NOTE — H&P (Unsigned)
PCP: Odette Fraction, MD Cardiologist: Radford Pax  Electrophysiologist: Caryl Comes  YQI:HKVQ is a 68 year old male patient of Dr. Radford Pax and Dr. Caryl Comes who has history of CAD status post CABG in 2010 and recent NSTEMI 05/2014 presenting with V. Tach requiring DC cardioversion amiodarone in the ambulance.Cardiac cath showed occluded SVG to the RCA with right to right collaterals and collaterals from the septal and OM. Medical therapy recommended. EF 40% cath 35-40% on echo.Patient also has had V. Tach status post ICD. Patient saw Hinton Dyer done, [place] 06/02/14 and10/30/15 for symptoms of dyspnea and 2 pound weight gain.patient's Lasix was increased. He has a moderate right pleural effusion that was slowly improving.  Patient comes in today complaining of worsening shortness of breath this morning. He is actually lost 5 pounds since last Friday. The Lasix is working well but he has no appetite and cannot lay flat. He has no edema. His O2 sats in the office are 96%. I discussed this patient in detail with Dr. Caryl Comes who recommends hospitalization and possible thoracentesis by pulmonary. We'll order a CT scan. He denies any chest pain, palpitations, dizziness or presyncope.  Allergies  Allergen Reactions  . Norvasc [Amlodipine Besylate] Rash    rash  . Sulfa Antibiotics Hives    Hives      Current Outpatient Prescriptions  Medication Sig Dispense Refill  . acetaminophen (TYLENOL) 325 MG tablet Take 1-2 tablets (325-650 mg total) by mouth every 4 (four) hours as needed for mild pain.    Marland Kitchen aspirin 81 MG chewable tablet Chew 1 tablet (81 mg total) by mouth daily.    Marland Kitchen atorvastatin (LIPITOR) 80 MG tablet Take 1 tablet (80 mg total) by mouth daily at 6 PM. 30 tablet 0  . clopidogrel (PLAVIX) 75 MG tablet Take 1 tablet (75 mg total) by mouth daily. 30 tablet 0  . dasatinib (SPRYCEL) 100 MG tablet Take 100 mg by mouth daily.    . furosemide (LASIX) 40 MG  tablet Take 1 tablet (40 mg total) by mouth daily. 30 tablet 11  . guaiFENesin (MUCINEX) 600 MG 12 hr tablet Take 600 mg by mouth 2 (two) times daily as needed for cough or to loosen phlegm.    Marland Kitchen lisinopril (PRINIVIL,ZESTRIL) 5 MG tablet Take 1 tablet (5 mg total) by mouth daily. 30 tablet 0  . metoprolol (LOPRESSOR) 100 MG tablet Take 1 tablet (100 mg total) by mouth 2 (two) times daily. 60 tablet 6  . nitroGLYCERIN (NITROSTAT) 0.4 MG SL tablet Place 1 tablet (0.4 mg total) under the tongue every 5 (five) minutes as needed for chest pain. 25 tablet 4  . potassium chloride SA (K-DUR,KLOR-CON) 20 MEQ tablet Take 1 tablet (20 mEq total) by mouth daily. 30 tablet 11   No current facility-administered medications for this visit.    Past Medical History  Diagnosis Date  . Hyperlipidemia   . HTN (hypertension)   . Allergic rhinitis   . Prostate hypertrophy   . Anxiety   . CAD (coronary artery disease)     a. s/p 3v CABG 2010. b. NSTEMI 05/2014 in setting of VT. Cath impressions: "Recent IMI with occluded SVG to RCA. Has Right to Right collaterals and collaterals from septal and OM. EF 40%."  . TIA (transient ischemic attack)     ASPVD, S./P. right CEA, 2002, and known occluded left carotid  . 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 Ischemic cardiomyopathy     a. Prior EF 36%. b. 2014: 50%. c. 05/2014: EF 35-40% by  echo, 40% with inf HK by cath.  . Carotid disease, bilateral     a. Right CEA 2002; known occluded left carotid.  . Depression   . PVC's (premature ventricular contractions)     a. PVC's with prior Holter showing PVC load of 22%.  . 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 Pneumonia 05/23/2014  . Ventricular tachycardia     a. Admitted with VT 05/2013 - EPS with inducible sustained monomorphic VT; s/p Medtronic ICD 05/21/14.  . Emphysema of lung     Past Surgical  History  Procedure Laterality Date  . Coronary artery bypass graft  02/2009    3 vessel  . Colonoscopy  07/2010  . Carotidectomy  2003    right side  . Back surgery    . Electrophysiologic study  05/21/14  . Ep implantable device  05/21/14    single chamber Metronic ICD    Family History  Problem Relation Age of Onset  . Heart disease Mother   . Alcohol abuse Father     History   Social History  . Marital Status: Married    Spouse Name: N/A    Number of Children: 2  . Years of Education: N/A   Occupational History  .      retired Development worker, [employer]   Social History Main Topics  . Smoking status: Former Smoker    Quit date: 08/08/1997  . Smokeless tobacco: Former Systems developer  . Alcohol Use: No  . Drug Use: No  . Sexual Activity: Not on file   Other Topics Concern  . Not on file   Social History Narrative    ROS: See HPI Eyes: Negative Ears:Negative for hearing loss, tinnitus Cardiovascular: Negative for chest pain, palpitations,irregular heartbeat, dyspnea, dyspnea on exertion, near-syncope, orthopnea, paroxysmal nocturnal dyspnea and syncope,edema, claudication, cyanosis,.  Respiratory: Negative for cough, hemoptysis, sputum production and wheezing.  Endocrine: Negative for cold intolerance and heat intolerance.  Hematologic/Lymphatic: Negative for adenopathy and bleeding problem. Does not bruise/bleed easily.  Musculoskeletal: Negative.  Gastrointestinal: Negative for nausea, vomiting, reflux, abdominal pain, diarrhea, constipation.  Genitourinary: Negative for bladder incontinence, dysuria, flank pain, frequency, hematuria, hesitancy, nocturia and urgency.  Neurological: Negative.  Allergic/Immunologic: Negative for environmental allergies.   BP 116/72 mmHg  Pulse 60  Ht 5\' 9"  (1.753 m)  Wt 188 lb (85.276 kg)  BMI 27.75 kg/m2  SpO2 96%  PHYSICAL  EXAM: Well-nournished, in no acute distress. Neck: No JVD, HJR, Bruit, or thyroid enlargement  Lungs: decreased breath sounds at the right lung base half the way up clear on the left Cardiovascular: RRR, PMI not displaced, Normal S1 and S2, no murmurs, gallops, bruit, thrill, or heave.  Abdomen: BS normal. Soft without organomegaly, masses, lesions or tenderness.  Extremities: without cyanosis, clubbing or edema. Good distal pulses bilateral  SKin: Warm, no lesions or rashes   Musculoskeletal: No deformities  Neuro: no focal signs   Wt Readings from Last 3 Encounters:  06/06/14 193 lb 12.8 oz (87.907 kg)  06/02/14 194 lb 6.4 oz (88.179 kg)  05/22/14 192 lb 14.4 oz (87.499 kg)     Recent Labs    Lab Results  Component Value Date   WBC 7.0 06/02/2014   HGB 11.1* 06/02/2014   HCT 34.1* 06/02/2014   PLT 249.0 06/02/2014   GLUCOSE 91 06/02/2014   CHOL 117 08/12/2013   TRIG 114 08/12/2013   HDL 27* 08/12/2013   LDLCALC 67 08/12/2013   ALT <6 04/29/2014   AST 13 04/29/2014  NA 141 06/02/2014   K 3.7 06/02/2014   CL 109 06/02/2014   CREATININE 1.2 06/02/2014   BUN 11 06/02/2014   CO2 25 06/02/2014   TSH 2.03 06/02/2014   INR 1.39 05/19/2014   HGBA1C 5.5 08/12/2013      [id number] sinus rhythm, normal EKG Chest X-ray IMPRESSION: 1. Moderate right pleural effusion, rather increased from 05/22/2014, with right basilar airspace disease. 2. Previously seen airspace opacification in the right perihilar/suprahilar region has improved but has not completely resolved. 3. Followup to clearing of the above findings is recommended to exclude underlying malignancy.   Electronically Signed  By: Lorin Picket M.D.  On: 06/02/2014 15  2-D echo 05/17/14 Study Conclusions  - Left ventricle: The cavity size was normal. Wall thickness was normal. Systolic function was  moderately reduced. The estimated ejection fraction was in the range of 35% to 40%. Global mild hypokinesis with inferior severe hypokinesis. Features are consistent with a pseudonormal left ventricular filling pattern, with concomitant abnormal relaxation and increased filling pressure (grade 2 diastolic dysfunction). - Aortic valve: There was no stenosis. - Mitral valve: Mildly calcified annulus. Normal thickness leaflets . There was trivial regurgitation. - Left atrium: The atrium was moderately dilated. - Right ventricle: The cavity size was normal. Systolic function was mildly reduced. - Right atrium: The atrium was mildly dilated. - Pulmonary arteries: No complete TR doppler jet so unable to estimate PA systolic pressure. - Inferior vena cava: The vessel was normal in size. The respirophasic diameter changes were in the normal range (>= 50%), consistent with normal central venous pressure.  Impressions:  - Normal LV size and systolic function, EF 72-53%. Global mild hypokinesis with severe inferior hypokinesis. Normal RV size with mildly decreased systolic function. No significant valvular abnormalities.              CAD (coronary artery disease) of artery bypass graft, occluded VG-RCA - Imogene Burn, PA-C at 06/09/2014 12:25 PM     Status: Written Related Problem: CAD (coronary artery disease) of artery bypass graft, occluded VG-RCA   Expand All Collapse All   Patient had recent NSTEMI 05/2014 with cath showing occluded SVG to the RCA, right to right collaterals and collaterals septal and OM. EF 40% with inferior hypokinesis. 2-D echo on 05/17/14 EF 35-40% with severe inferior hypokinesis grade 2 diastolic dysfunction medical therapy was recommended for her CAD. He did have inducible V. Tach and Medtronic ICD was placed.Patient denies chest pain.            HTN (hypertension) - Imogene Burn, PA-C at 06/09/2014 12:25 PM      Status: Written Related Problem: HTN (hypertension)   Expand All Collapse All   BP stable            Pleural effusion, right - Imogene Burn, PA-C at 06/09/2014 12:26 PM     Status: Written Related Problem: Pleural effusion, right   Expand All Collapse All   Patient continues to have dyspnea at rest. He has a moderate size pleural effusion by chest x-ray. He has diuresed 5 pounds and still remains short of breath. Dr. Caryl Comes recommends admitting him to the hospital for CT scan and possible thoracentesis by pulmonary.            Cardiomyopathy, ischemic - Imogene Burn, PA-C at 06/09/2014 12:27 PM     Status: Written Related Problem: Cardiomyopathy, ischemic   Expand All Collapse All   Heart failure is compensated today.  Ventricular tachycardia - Imogene Burn, PA-C at 06/09/2014 12:28 PM     Status: Written Related Problem: Ventricular tachycardia   Expand All Collapse All   Recent ICD placement, left.

## 2014-06-09 NOTE — Procedures (Signed)
Thoracentesis Procedure Note  Pre-operative Diagnosis: Right sided pleural effusion  Post-operative Diagnosis: same  Indications: Aspiration of right sided pleural effusion for diagnostic and therapeutic purposes.  Procedure Details  Consent: Informed consent was obtained. Risks of the procedure were discussed including: infection, bleeding, pain, pneumothorax.  Under sterile conditions the patient was positioned. Betadine solution and sterile drapes were utilized.  1% buffered lidocaine was used to anesthetize the 6th rib space. Fluid was obtained without any difficulties and minimal blood loss.  A dressing was applied to the wound and wound care instructions were provided.   Findings 1500 ml of clear / amber pleural fluid was obtained. A sample was sent to Pathology for cytogenetics, cell counts, LDH, protein, culture.  Complications:  None; patient tolerated the procedure well.          Condition: stable  Plan A follow up chest x-ray >> no pneumothorax. Bed Rest for 0 hours. Tylenol 650 mg. for pain.   Montey Hora, [facility] Pgr: [phone]  or [phone] 06/09/2014, 3:24 PM   I was present for procedure.  Chesley Mires, MD [facility] Pulmonary/Critical Care 06/09/2014, 4:07 PM Pager:  [phone] After 3pm call: [phone]

## 2014-06-09 NOTE — Telephone Encounter (Signed)
Calling stating he has been very SOB since woke up this AM.  Speaking with him he is very winded.  States he has taken all of his medication.  Wants to know if medication could cause him to be SOB.  Denies CP.  Spoke w/Michele Lenze,PA who states she will see him this AM.  Advised pt to come to office and will be seen by Lucita Ferrara.  He is agreeable and will come in.

## 2014-06-09 NOTE — Assessment & Plan Note (Signed)
Heart failure is compensated today.

## 2014-06-09 NOTE — Assessment & Plan Note (Signed)
Recent ICD placement, left.

## 2014-06-09 NOTE — Assessment & Plan Note (Signed)
BP stable.

## 2014-06-09 NOTE — Assessment & Plan Note (Signed)
Patient continues to have dyspnea at rest. He has a moderate size pleural effusion by chest x-ray. He has diuresed 5 pounds and still remains short of breath. Dr. Caryl Comes recommends admitting him to the hospital for CT scan and possible thoracentesis by pulmonary.

## 2014-06-09 NOTE — Patient Instructions (Signed)
You are being admitted to [facility] to Entrance A and then to the admitting department.

## 2014-06-09 NOTE — Assessment & Plan Note (Signed)
Patient had recent NSTEMI 05/2014 with cath showing occluded SVG to the RCA, right to right collaterals and collaterals septal and OM. EF 40% with inferior hypokinesis. 2-D echo on 05/17/14 EF 35-40% with severe inferior hypokinesis grade 2 diastolic dysfunction medical therapy was recommended for her CAD. He did have inducible V. Tach and Medtronic ICD was placed.Patient denies chest pain.

## 2014-06-09 NOTE — Progress Notes (Signed)
PCP: Odette Fraction, MD Cardiologist: Radford Pax  Electrophysiologist: Caryl Comes  [facility]:NOBS is a 68 year old male patient of Dr. Radford Pax and Dr. Caryl Comes who has history of CAD status post CABG in 2010 and recent NSTEMI 05/2014 presenting with V. Tach requiring DC cardioversion amiodarone in the ambulance.Cardiac cath showed occluded SVG to the RCA with right to right collaterals and collaterals from the septal and OM. Medical therapy recommended. EF 40% cath 35-40% on echo.Patient also has had V. Tach status post ICD. Patient saw Hinton Dyer done, [place] 06/02/14 and10/30/15 for symptoms of dyspnea and 2 pound weight gain.patient's Lasix was increased. He has a moderate right pleural effusion that was slowly improving.  Patient comes in today complaining of worsening shortness of breath this morning. He is actually lost 5 pounds since last Friday. The Lasix is working well but he has no appetite and cannot lay flat. He has no edema. His O2 sats in the office are 96%. I discussed this patient in detail with Dr. Caryl Comes who recommends hospitalization and possible thoracentesis by pulmonary. We'll order a CT scan. He denies any chest pain, palpitations, dizziness or presyncope.  Allergies  Allergen Reactions  . Norvasc [Amlodipine Besylate] Rash    rash  . Sulfa Antibiotics Hives    Hives      Current Outpatient Prescriptions  Medication Sig Dispense Refill  . acetaminophen (TYLENOL) 325 MG tablet Take 1-2 tablets (325-650 mg total) by mouth every 4 (four) hours as needed for mild pain.    Marland Kitchen aspirin 81 MG chewable tablet Chew 1 tablet (81 mg total) by mouth daily.    Marland Kitchen atorvastatin (LIPITOR) 80 MG tablet Take 1 tablet (80 mg total) by mouth daily at 6 PM. 30 tablet 0  . clopidogrel (PLAVIX) 75 MG tablet Take 1 tablet (75 mg total) by mouth daily. 30 tablet 0  . dasatinib (SPRYCEL) 100 MG tablet Take 100 mg by mouth daily.    . furosemide (LASIX) 40 MG tablet Take 1 tablet (40 mg total) by  mouth daily. 30 tablet 11  . guaiFENesin (MUCINEX) 600 MG 12 hr tablet Take 600 mg by mouth 2 (two) times daily as needed for cough or to loosen phlegm.    Marland Kitchen lisinopril (PRINIVIL,ZESTRIL) 5 MG tablet Take 1 tablet (5 mg total) by mouth daily. 30 tablet 0  . metoprolol (LOPRESSOR) 100 MG tablet Take 1 tablet (100 mg total) by mouth 2 (two) times daily. 60 tablet 6  . nitroGLYCERIN (NITROSTAT) 0.4 MG SL tablet Place 1 tablet (0.4 mg total) under the tongue every 5 (five) minutes as needed for chest pain. 25 tablet 4  . potassium chloride SA (K-DUR,KLOR-CON) 20 MEQ tablet Take 1 tablet (20 mEq total) by mouth daily. 30 tablet 11   No current facility-administered medications for this visit.    Past Medical History  Diagnosis Date  . Hyperlipidemia   . HTN (hypertension)   . Allergic rhinitis   . Prostate hypertrophy   . Anxiety   . CAD (coronary artery disease)     a. s/p 3v CABG 2010. b. NSTEMI 05/2014 in setting of VT. Cath impressions: "Recent IMI with occluded SVG to RCA. Has Right to Right collaterals and collaterals from septal and OM. EF 40%."  . TIA (transient ischemic attack)     ASPVD, S./P. right CEA, 2002, and known occluded left carotid  . 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 Ischemic cardiomyopathy     a. Prior EF 36%. b. 2014: 50%. c. 05/2014: EF  35-40% by echo, 40% with inf HK by cath.  . Carotid disease, bilateral     a. Right CEA 2002; known occluded left carotid.  . Depression   . PVC's (premature ventricular contractions)     a. PVC's with prior Holter showing PVC load of 22%.  . 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 Pneumonia 05/23/2014  . Ventricular tachycardia     a. Admitted with VT 05/2013 - EPS with inducible sustained monomorphic VT; s/p [employer] ICD 05/21/14.  . Emphysema of lung     Past Surgical History  Procedure Laterality Date  . Coronary artery bypass graft  02/2009    3 vessel  . Colonoscopy  07/2010  . Carotidectomy  2003     right side  . Back surgery    . Electrophysiologic study  05/21/14  . Ep implantable device  05/21/14    single chamber Metronic ICD    Family History  Problem Relation Age of Onset  . Heart disease Mother   . Alcohol abuse Father     History   Social History  . Marital Status: Married    Spouse Name: N/A    Number of Children: 2  . Years of Education: N/A   Occupational History  .      retired Development worker, [employer]   Social History Main Topics  . Smoking status: Former Smoker    Quit date: 08/08/1997  . Smokeless tobacco: Former Systems developer  . Alcohol Use: No  . Drug Use: No  . Sexual Activity: Not on file   Other Topics Concern  . Not on file   Social History Narrative    ROS: See HPI Eyes: Negative Ears:Negative for hearing loss, tinnitus Cardiovascular: Negative for chest pain, palpitations,irregular heartbeat, dyspnea, dyspnea on exertion, near-syncope, orthopnea, paroxysmal nocturnal dyspnea and syncope,edema, claudication, cyanosis,.  Respiratory:   Negative for cough, hemoptysis, sputum production and wheezing.   Endocrine: Negative for cold intolerance and heat intolerance.  Hematologic/Lymphatic: Negative for adenopathy and bleeding problem. Does not bruise/bleed easily.  Musculoskeletal: Negative.   Gastrointestinal: Negative for nausea, vomiting, reflux, abdominal pain, diarrhea, constipation.   Genitourinary: Negative for bladder incontinence, dysuria, flank pain, frequency, hematuria, hesitancy, nocturia and urgency.  Neurological: Negative.  Allergic/Immunologic: Negative for environmental allergies.   BP 116/72 mmHg  Pulse 60  Ht 5\' 9"  (1.753 m)  Wt 188 lb (85.276 kg)  BMI 27.75 kg/m2  SpO2 96%  PHYSICAL EXAM: Well-nournished, in no acute distress. Neck: No JVD, HJR, Bruit, or thyroid enlargement  Lungs: decreased breath sounds at the right lung base half the way up clear on the left Cardiovascular: RRR, PMI not displaced, Normal S1 and S2, no murmurs,  gallops, bruit, thrill, or heave.  Abdomen: BS normal. Soft without organomegaly, masses, lesions or tenderness.  Extremities: without cyanosis, clubbing or edema. Good distal pulses bilateral  SKin: Warm, no lesions or rashes   Musculoskeletal: No deformities  Neuro: no focal signs   Wt Readings from Last 3 Encounters:  06/06/14 193 lb 12.8 oz (87.907 kg)  06/02/14 194 lb 6.4 oz (88.179 kg)  05/22/14 192 lb 14.4 oz (87.499 kg)    Lab Results  Component Value Date   WBC 7.0 06/02/2014   HGB 11.1* 06/02/2014   HCT 34.1* 06/02/2014   PLT 249.0 06/02/2014   GLUCOSE 91 06/02/2014   CHOL 117 08/12/2013   TRIG 114 08/12/2013   HDL 27* 08/12/2013   LDLCALC 67 08/12/2013   ALT <6 04/29/2014   AST 13 04/29/2014  NA 141 06/02/2014   K 3.7 06/02/2014   CL 109 06/02/2014   CREATININE 1.2 06/02/2014   BUN 11 06/02/2014   CO2 25 06/02/2014   TSH 2.03 06/02/2014   INR 1.39 05/19/2014   HGBA1C 5.5 08/12/2013    TAE:WYBRKV sinus rhythm, normal EKG Chest X-ray IMPRESSION: 1. Moderate right pleural effusion, rather increased from 05/22/2014, with right basilar airspace disease. 2. Previously seen airspace opacification in the right perihilar/suprahilar region has improved but has not completely resolved. 3. Followup to clearing of the above findings is recommended to exclude underlying malignancy.   Electronically Signed  By: Lorin Picket M.D.  On: 06/02/2014 15  2-D echo 05/17/14 Study Conclusions  - Left ventricle: The cavity size was normal. Wall thickness was   normal. Systolic function was moderately reduced. The estimated   ejection fraction was in the range of 35% to 40%. Global mild   hypokinesis with inferior severe hypokinesis. Features are   consistent with a pseudonormal left ventricular filling pattern,   with concomitant abnormal relaxation and increased filling   pressure (grade 2 diastolic dysfunction). - Aortic valve: There was no  stenosis. - Mitral valve: Mildly calcified annulus. Normal thickness leaflets   . There was trivial regurgitation. - Left atrium: The atrium was moderately dilated. - Right ventricle: The cavity size was normal. Systolic function   was mildly reduced. - Right atrium: The atrium was mildly dilated. - Pulmonary arteries: No complete TR doppler jet so unable to   estimate PA systolic pressure. - Inferior vena cava: The vessel was normal in size. The   respirophasic diameter changes were in the normal range (>= 50%),   consistent with normal central venous pressure.  Impressions:  - Normal LV size and systolic function, EF 35-52%. Global mild   hypokinesis with severe inferior hypokinesis. Normal RV size with   mildly decreased systolic function. No significant valvular   abnormalities.

## 2014-06-09 NOTE — Consult Note (Signed)
Name: Anthony Wolfe MRN: [id number] DOB: Mar 15, 1946    ADMISSION DATE:  06/09/2014 CONSULTATION DATE:  06/09/2014  REFERRING MD :  Loralie Champagne  CHIEF COMPLAINT:  Short of breath  BRIEF PATIENT DESCRIPTION:  53 male with remote hx of smoking was in hospital in October for VT.  He was tx for PNA on 05/22/14.  SIGNIFICANT EVENTS  10/14 Insertion AICD 10/15 PNA >> tx with levaquin 10/26 CXR Rt effusion >> no improve with outpt diuresis 11/02 Admit   STUDIES:    HISTORY OF PRESENT ILLNESS:   68 yo male was admitted in October with weakness from VT.  He eventually had AICD inserted.  He was noted to have worsening dyspnea while in hospital and found to have PNA.  He was started on Abx for PNA.  He completed course of Abx.  He had f/u CXR on 10/26 which showed Rt pleural effusion.  He was tried on diuresis.  He had progressive dyspnea on exertion.  Outpt CXR from today reported to show increase in pleural effusion.  He was advised to come to hospital for admission.  He has discomfort in Rt chest with breathing.  He denies cough, sputum, or hemoptysis.  He has not had fever, but has been feeling chilled >> this is not normal for him.  He has lost about 5 lbs over the past few weeks.  He was having diarrhea while on Abx >> improved since.  He denies abdominal pain, leg swelling, or gland swelling.  PAST MEDICAL HISTORY :   has a past medical history of Hyperlipidemia; HTN (hypertension); Allergic rhinitis; Prostate hypertrophy; Anxiety; CAD (coronary artery disease); TIA (transient ischemic attack); CML (chronic myelocytic leukemia) (05/09/2012); Ischemic cardiomyopathy; Carotid disease, bilateral; Depression; PVC's (premature ventricular contractions); Anemia in neoplastic disease (09/25/2013); Pneumonia (05/23/2014); Ventricular tachycardia; Emphysema of lung; NSTEMI (non-ST elevation myocardial infarction) (05/2014); Pleural effusion (05/2014); and CHF (congestive heart failure).  has past  surgical history that includes Coronary artery bypass graft (02/2009); Colonoscopy (07/2010); carotidectomy (2003); Back surgery; Cardiac catheterization (05/21/14); and Cardiac catheterization (05/21/14). Prior to Admission medications   Medication Sig Start Date End Date Taking? Authorizing Provider  acetaminophen (TYLENOL) 325 MG tablet Take 1-2 tablets (325-650 mg total) by mouth every 4 (four) hours as needed for mild pain. 05/23/14  Yes Isaiah Serge, NP  aspirin 81 MG chewable tablet Chew 1 tablet (81 mg total) by mouth daily. 05/23/14  Yes Isaiah Serge, NP  atorvastatin (LIPITOR) 80 MG tablet Take 1 tablet (80 mg total) by mouth daily at 6 PM. 05/23/14  Yes Isaiah Serge, NP  clopidogrel (PLAVIX) 75 MG tablet Take 1 tablet (75 mg total) by mouth daily. 05/23/14  Yes Isaiah Serge, NP  dasatinib (SPRYCEL) 100 MG tablet Take 100 mg by mouth daily.   Yes Historical Provider, MD  furosemide (LASIX) 40 MG tablet Take 1 tablet (40 mg total) by mouth daily. 06/02/14  Yes Dayna N Dunn, PA-C  guaiFENesin (MUCINEX) 600 MG 12 hr tablet Take 600 mg by mouth 2 (two) times daily as needed for cough or to loosen phlegm.   Yes Historical Provider, MD  lisinopril (PRINIVIL,ZESTRIL) 5 MG tablet Take 1 tablet (5 mg total) by mouth daily. 05/23/14  Yes Isaiah Serge, NP  metoprolol (LOPRESSOR) 100 MG tablet Take 1 tablet (100 mg total) by mouth 2 (two) times daily. 06/04/14  Yes Dayna N Dunn, PA-C  potassium chloride SA (K-DUR,KLOR-CON) 20 MEQ tablet Take 1 tablet (20 mEq total)  by mouth daily. 06/02/14  Yes Dayna N Dunn, PA-C  nitroGLYCERIN (NITROSTAT) 0.4 MG SL tablet Place 1 tablet (0.4 mg total) under the tongue every 5 (five) minutes as needed for chest pain. 05/23/14   Isaiah Serge, NP   Allergies  Allergen Reactions  . Norvasc [Amlodipine Besylate] Rash    rash  . Sulfa Antibiotics Hives    Hives     FAMILY HISTORY:  family history includes Alcohol abuse in his father; Heart disease in his  mother. SOCIAL HISTORY:  reports that he quit smoking about 16 years ago. He has quit using smokeless tobacco. He reports that he does not drink alcohol or use illicit drugs.  REVIEW OF SYSTEMS:   Negative except above  SUBJECTIVE:   VITAL SIGNS: Temp:  [98.9 F (37.2 C)] 98.9 F (37.2 C) (11/02 1248) Pulse Rate:  [60-61] 61 (11/02 1248) Resp:  [16] 16 (11/02 1248) BP: (116-136)/(53-72) 136/53 mmHg (11/02 1248) SpO2:  [96 %] 96 % (11/02 1248) Weight:  [188 lb (85.276 kg)] 188 lb (85.276 kg) (11/02 1040)  PHYSICAL EXAMINATION: General:  No distress Neuro:  Alert, normal strength, CN intact HEENT:  Pupils reactive, no sinus tenderness, no LAN Cardiovascular:  Regular, no murmur Lungs:  Decreased BS with dullness to percussion on Rt Abdomen:  Soft, non tender, + bowel sounds Musculoskeletal:  No edema Skin:  No rashes  CBC No results for input(s): WBC, HGB, HCT, PLT in the last 72 hours.  Coag's No results for input(s): APTT, INR in the last 72 hours.  BMET No results for input(s): NA, K, CL, CO2, BUN, CREATININE, GLUCOSE in the last 72 hours.  Electrolytes No results for input(s): CALCIUM, MG, PHOS in the last 72 hours.  Sepsis Markers No results for input(s): PROCALCITON, O2SATVEN in the last 72 hours.  Invalid input(s): LACTICACIDVEN  ABG No results for input(s): PHART, PCO2ART, PO2ART in the last 72 hours.  Liver Enzymes No results for input(s): AST, ALT, ALKPHOS, BILITOT, ALBUMIN in the last 72 hours.  Cardiac Enzymes No results for input(s): TROPONINI, PROBNP in the last 72 hours.  Glucose No results for input(s): GLUCAP in the last 72 hours.  Imaging No results found.    ASSESSMENT / PLAN:  Progressive dyspnea with Rt pleural effusion.  Differential is parapneumonia effusion after recent tx for PNA, heart failure (seems less likely with lack of response to diuresis), or side effect from dasatinib use (seems less likely, but still  possible). Plan: Will assess with bedside u/s and then decide to proceed with thoracentesis first versus getting CT chest first Will defer Abx for now until we can get pleural fluid sampling  Diuresis per primary team  Discussed thoracentesis procedure with pt.  Explained risks are bleeding, infection, pneumothorax.  He would be agreeable to proceed with thoracentesis if needed.  Updated pt's wife at bedside about plan.  Chesley Mires, MD [facility] Pulmonary/Critical Care 06/09/2014, 2:27 PM Pager:  [phone] After 3pm call: [phone]

## 2014-06-10 ENCOUNTER — Observation Stay (HOSPITAL_COMMUNITY): Payer: Commercial Managed Care - HMO

## 2014-06-10 ENCOUNTER — Telehealth: Payer: Self-pay | Admitting: Family Medicine

## 2014-06-10 ENCOUNTER — Other Ambulatory Visit: Payer: Commercial Managed Care - HMO

## 2014-06-10 ENCOUNTER — Encounter (HOSPITAL_COMMUNITY): Payer: Commercial Managed Care - HMO

## 2014-06-10 DIAGNOSIS — R197 Diarrhea, unspecified: Secondary | ICD-10-CM

## 2014-06-10 DIAGNOSIS — R0602 Shortness of breath: Secondary | ICD-10-CM | POA: Diagnosis not present

## 2014-06-10 DIAGNOSIS — J90 Pleural effusion, not elsewhere classified: Secondary | ICD-10-CM

## 2014-06-10 LAB — BASIC METABOLIC PANEL
Anion gap: 12 (ref 5–15)
BUN: 15 mg/dL (ref 6–23)
CO2: 24 meq/L (ref 19–32)
Calcium: 8.5 mg/dL (ref 8.4–10.5)
Chloride: 106 mEq/L (ref 96–112)
Creatinine, Ser: 1.1 mg/dL (ref 0.50–1.35)
GFR calc Af Amer: 77 mL/min — ABNORMAL LOW (ref >90)
GFR calc non Af Amer: 66 mL/min — ABNORMAL LOW (ref >90)
Glucose, Bld: 101 mg/dL — ABNORMAL HIGH (ref 70–99)
POTASSIUM: 3.6 meq/L — AB (ref 3.7–5.3)
SODIUM: 142 meq/L (ref 137–147)

## 2014-06-10 LAB — PATHOLOGIST SMEAR REVIEW

## 2014-06-10 MED ORDER — SACCHAROMYCES BOULARDII 250 MG PO CAPS: 250.0000 mg | ORAL_CAPSULE | Freq: Two times a day (BID) | ORAL | Status: DC

## 2014-06-10 NOTE — Telephone Encounter (Signed)
Obtain stool samples immediately for C. Diff x 3 and stool GI pathogen assay.  Meanwhile begin florastor 250 mg pobid.

## 2014-06-10 NOTE — Telephone Encounter (Signed)
I called wife and told about needing stool samples and also about RX

## 2014-06-10 NOTE — Progress Notes (Signed)
Patient ID: Anthony Wolfe, male   DOB: 07-20-1946, 68 y.o.   MRN: [id number]    Subjective:  Much better Wants to go home Less dyspnic taking PO and urinating   Objective:  Filed Vitals:   06/09/14 1248 06/09/14 2046 06/10/14 0523  BP: 136/53 114/50 108/58  Pulse: 61 59 56  Temp: 98.9 F (37.2 C) 98.5 F (36.9 C) 98.5 F (36.9 C)  TempSrc: Oral Oral Oral  Resp: 16 18 18   Weight:   82.419 kg (181 lb 11.2 oz)  SpO2: 96% 96% 95%    Intake/Output from previous day: No intake or output data in the 24 hours ending 06/10/14 0931  Physical Exam: Affect appropriate Healthy:  appears stated age HEENT: normal Neck supple with no adenopathy JVP normal no bruits no thyromegaly Lungs improved RLL aeration withh no wheezing and good diaphragmatic motion Heart:  S1/S2 no murmur, no rub, gallop or click  AICD under left clavicle healing well PMI normal Abdomen: benighn, BS positve, no tenderness, no AAA no bruit.  No HSM or HJR Distal pulses intact with no bruits No edema Neuro non-focal Skin warm and dry No muscular weakness   Lab Results: Basic Metabolic Panel:  Recent Labs  06/09/14 1400 06/10/14 0545  NA 145 142  K 4.1 3.6*  CL 105 106  CO2 24 24  GLUCOSE 101* 101*  BUN 17 15  CREATININE 1.25 1.10  CALCIUM 9.0 8.5   Liver Function Tests:  Recent Labs  06/09/14 1400  AST 17  ALT 11  ALKPHOS 86  BILITOT 0.6  PROT 6.6  ALBUMIN 3.3*   CBC:  Recent Labs  06/09/14 1400  WBC 6.0  NEUTROABS 4.0  HGB 12.2*  HCT 37.3*  MCV 97.4  PLT 148*    Imaging: Ct Chest Wo Contrast  06/10/2014   CLINICAL DATA:  68 year old male with shortness of breath. Right side thoracentesis yesterday, query residual pleural effusion. Initial encounter.  EXAM: CT CHEST WITHOUT CONTRAST  TECHNIQUE: Multidetector CT imaging of the chest was performed following the standard protocol without IV contrast.  COMPARISON:  Chest radiographs 06/09/2014 and earlier.  FINDINGS: Small to  moderate layering right pleural effusion with compressive mostly right lower lobe atelectasis. Major airways are patent. There are some air bronchograms in the right lower lobe. There is superimposed multifocal right upper lobe patchy and nodular peribronchovascular opacity associated with peribronchial thickening and smaller tree-in-bud nodules. See series 3 images 15- 33. In the apical aspect of the upper lobe confluent nodularity measures up to 12 mm (image 21). Still, this appears inflammatory in nature and I note improved right upper lobe ventilation since 05/22/2014. The right middle lobe is largely spared.  There is underlying emphysema, upper lobe predominant. No confluent left lung opacity. Occasional up to 3 mm subpleural nodules in the left lung (e.g. Series 3, image 40).  No pericardial or left pleural effusion. Cardiomegaly. Transvenous cardiac AICD. Precarinal 11 mm short axis lymph nodes. There is a 9-10 mm AP window lymph node on the left. Other mediastinal nodes are diminutive. Difficult to assess hilar nodes in the absence of IV contrast.  Sequelae of CABG.  Calcified atherosclerosis is widespread.  No axillary lymphadenopathy. Negative visualized non contrast liver, gallbladder, spleen, pancreas, left kidney, and bowel in the upper abdomen. There is bilateral adrenal gland thickening compatible with hyperplasia. There is a large simple fluid density exophytic cyst arising from the right renal upper pole (7.5 cm diameter).  No acute osseous abnormality identified.  IMPRESSION: 1. Residual small to moderate layering right pleural effusion. Compressive atelectasis most affecting the right lower lobe. 2. Superimposed inflammatory appearing nodularity and peribronchovascular opacity in the right upper lobe, regressed since the 05/22/14 radiographs compatible with regressed pneumonia. Recommend an outpatient post treatment chest radiograph or chest CT to document full resolution. 3. Reactive mediastinal  lymph nodes.   Electronically Signed   By: Lars Pinks M.D.   On: 06/10/2014 08:08   Dg Chest Port 1 View  06/09/2014   CLINICAL DATA:  Status post thoracentesis.  EXAM: PORTABLE CHEST - 1 VIEW  COMPARISON:  06/02/2014.  FINDINGS: Small right pleural effusion, decreased. Persistent ill-defined opacity at the right lung base. Clear left lung. Stable enlarged cardiac silhouette, post CABG changes and left subclavian AICD lead. No pneumothorax. Unremarkable bones.  IMPRESSION: 1. Decreased right pleural fluid with no pneumothorax following thoracentesis. 2. Persistent small right pleural effusion and right basilar atelectasis or pneumonia. 3. Stable cardiomegaly.   Electronically Signed   By: Enrique Sack M.D.   On: 06/09/2014 15:50    Cardiac Studies:  ECG:    Telemetry:  NSR no VT   Echo:   Medications:   . aspirin EC  81 mg Oral Daily  . atorvastatin  80 mg Oral q1800  . clopidogrel  75 mg Oral Daily  . dasatinib  100 mg Oral Daily  . furosemide  40 mg Oral Q breakfast  . guaiFENesin  600 mg Oral BID  . lisinopril  5 mg Oral Daily  . metoprolol tartrate  100 mg Oral BID  . potassium chloride  20 mEq Oral Daily  . sodium chloride  3 mL Intravenous Q12H       Assessment/Plan:  Pleural Effusion:  Post thoracentesis  With good result  ? Residual inflamation/pneumonia.  Would ask pulmonary prior to d/c if antibiotics needed  Continue lasix  No pneumothorax CAD s/p CABG in 2010, NSTEMI 05/2014 treated medically based on cath findings - continue aspirin, Plavix, statin, beta blocker.  Hyperlipidemia - statin was advanced during recent admission. Check CMET/lipids at OV 07/04/14.   Ventricular tachycardia - s/p recent ICD implantation with normal interrogation on 06/02/14. F/u EP as planned.  D/C home today f/u Dr Radford Pax, pulmonary and [facility] [place] 06/10/2014, 9:31 AM

## 2014-06-10 NOTE — Telephone Encounter (Signed)
Has been in and out of Hospital.  Just back home again today after 24 hr admit.  Is having bad diarrhea from antibiotics and has been having now for some time off/on. Called specialist and they told them to call us (PCP).  Made appt for Thursday.

## 2014-06-10 NOTE — Discharge Summary (Signed)
Physician Discharge Summary     PCP: Odette Fraction, MD Cardiologist: Radford Pax  Electrophysiologist: Caryl Comes  Patient ID: Anthony Wolfe MRN: [id number] DOB/AGE: April 27, 1946 68 y.o.  Admit date: 06/09/2014 Discharge date: 06/10/2014  Admission Diagnoses:  Pleural Effusion  Discharge Diagnoses:  Active Problems:   Shortness of breath   Status post thoracentesis   Pleural Effusion    VT   CAD  Discharged Condition: stable  Hospital Course:     HPI:  This is a 68 year old male patient of Dr. Radford Pax and Dr. Caryl Comes who has history of CAD status post CABG in 2010 and recent NSTEMI 05/2014 presenting with V. Tach requiring DC cardioversion amiodarone in the ambulance.Cardiac cath showed occluded SVG to the RCA with right to right collaterals and collaterals from the septal and OM. Medical therapy recommended. EF 40% cath 35-40% on echo.Patient also has had V. Tach status post ICD. Patient saw Hinton Dyer done, [place] 06/02/14 and10/30/15 for symptoms of dyspnea and 2 pound weight gain.patient's Lasix was increased. He has a moderate right pleural effusion that was slowly improving.  Patient presented to the office complaining of worsening shortness of breath this morning. He is actually lost 5 pounds since last Friday. The Lasix is working well but he has no appetite and cannot lay flat. He has no edema. His O2 sats in the office are 96%. Ignacia Marvel, PAC, discussed this patient in detail with Dr. Caryl Comes who recommended hospitalization and possible thoracentesis by pulmonary.  He denies any chest pain, palpitations, dizziness or presyncope.  The patient underwent thoracentesis of the right sided pleural effusion resulting in 1500 ml of clear / amber pleural fluid being removed.  Cytology reviewed.  Culture pending.  Per PCCM antibiotics are not needed.  The patient felt much better after the procedure.  The patient was seen by Dr. Johnsie Cancel who felt she was stable for DC home.   Consults:  PCCM  Significant Diagnostic Studies:   CT CHEST WITHOUT CONTRAST  TECHNIQUE: Multidetector CT imaging of the chest was performed following the standard protocol without IV contrast.  COMPARISON: Chest radiographs 06/09/2014 and earlier.  FINDINGS: Small to moderate layering right pleural effusion with compressive mostly right lower lobe atelectasis. Major airways are patent. There are some air bronchograms in the right lower lobe. There is superimposed multifocal right upper lobe patchy and nodular peribronchovascular opacity associated with peribronchial thickening and smaller tree-in-bud nodules. See series 3 images 15- 33. In the apical aspect of the upper lobe confluent nodularity measures up to 12 mm (image 21). Still, this appears inflammatory in nature and I note improved right upper lobe ventilation since 05/22/2014. The right middle lobe is largely spared.  There is underlying emphysema, upper lobe predominant. No confluent left lung opacity. Occasional up to 3 mm subpleural nodules in the left lung (e.g. Series 3, image 40).  No pericardial or left pleural effusion. Cardiomegaly. Transvenous cardiac AICD. Precarinal 11 mm short axis lymph nodes. There is a 9-10 mm AP window lymph node on the left. Other mediastinal nodes are diminutive. Difficult to assess hilar nodes in the absence of IV contrast.  Sequelae of CABG. Calcified atherosclerosis is widespread.  No axillary lymphadenopathy. Negative visualized non contrast liver, gallbladder, spleen, pancreas, left kidney, and bowel in the upper abdomen. There is bilateral adrenal gland thickening compatible with hyperplasia. There is a large simple fluid density exophytic cyst arising from the right renal upper pole (7.5 cm diameter).  No acute osseous abnormality identified.  IMPRESSION: 1. Residual small to  moderate layering right pleural effusion. Compressive atelectasis most affecting the right  lower lobe. 2. Superimposed inflammatory appearing nodularity and peribronchovascular opacity in the right upper lobe, regressed since the 05/22/14 radiographs compatible with regressed pneumonia. Recommend an outpatient post treatment chest radiograph or chest CT to document full resolution. 3. Reactive mediastinal lymph nodes.  Treatments: See above  Discharge Exam: Blood pressure 108/58, pulse 56, temperature 98.5 F (36.9 C), temperature source Oral, resp. rate 18, weight 181 lb 11.2 oz (82.419 kg), SpO2 95 %.   Disposition: 01-Home or Self Care  Discharge Instructions    Diet - low sodium heart healthy    Complete by:  As directed      Increase activity slowly    Complete by:  As directed             Medication List    TAKE these medications        acetaminophen 325 MG tablet  Commonly known as:  TYLENOL  Take 1-2 tablets (325-650 mg total) by mouth every 4 (four) hours as needed for mild pain.     aspirin 81 MG chewable tablet  Chew 1 tablet (81 mg total) by mouth daily.     atorvastatin 80 MG tablet  Commonly known as:  LIPITOR  Take 1 tablet (80 mg total) by mouth daily at 6 PM.     clopidogrel 75 MG tablet  Commonly known as:  PLAVIX  Take 1 tablet (75 mg total) by mouth daily.     dasatinib 100 MG tablet  Commonly known as:  SPRYCEL  Take 100 mg by mouth daily.     furosemide 40 MG tablet  Commonly known as:  LASIX  Take 1 tablet (40 mg total) by mouth daily.     guaiFENesin 600 MG 12 hr tablet  Commonly known as:  MUCINEX  Take 600 mg by mouth 2 (two) times daily as needed for cough or to loosen phlegm.     lisinopril 5 MG tablet  Commonly known as:  PRINIVIL,ZESTRIL  Take 1 tablet (5 mg total) by mouth daily.     metoprolol 100 MG tablet  Commonly known as:  LOPRESSOR  Take 1 tablet (100 mg total) by mouth 2 (two) times daily.     nitroGLYCERIN 0.4 MG SL tablet  Commonly known as:  NITROSTAT  Place 1 tablet (0.4 mg total) under the tongue  every 5 (five) minutes as needed for chest pain.     potassium chloride SA 20 MEQ tablet  Commonly known as:  K-DUR,KLOR-CON  Take 1 tablet (20 mEq total) by mouth daily.           Follow-up Information    Follow up with Melina Copa, PA-C On 06/16/2014.   Specialty:  Cardiology   Why:  10:30AM   Contact information:   [address] [place] [address] [phone]       Follow up with Virl Axe, MD On 07/04/2014.   Specialty:  Cardiology   Why:  2:30 PM   Contact information:   [address] [place] [address] [phone]      Greater than 30 minutes was spent completing the patient's discharge.   SignedTarri Fuller, [place] 06/10/2014, 11:25 AM

## 2014-06-12 ENCOUNTER — Encounter: Payer: Self-pay | Admitting: Family Medicine

## 2014-06-12 ENCOUNTER — Other Ambulatory Visit: Payer: Self-pay | Admitting: Family Medicine

## 2014-06-12 ENCOUNTER — Ambulatory Visit (INDEPENDENT_AMBULATORY_CARE_PROVIDER_SITE_OTHER): Payer: Medicare HMO | Admitting: Family Medicine

## 2014-06-12 VITALS — BP 100/58 | HR 68 | Temp 98.1°F | Resp 16 | Wt 184.0 lb

## 2014-06-12 DIAGNOSIS — R197 Diarrhea, unspecified: Secondary | ICD-10-CM

## 2014-06-12 DIAGNOSIS — Z09 Encounter for follow-up examination after completed treatment for conditions other than malignant neoplasm: Secondary | ICD-10-CM

## 2014-06-12 MED ORDER — METRONIDAZOLE 500 MG PO TABS: 500.0000 mg | ORAL_TABLET | Freq: Three times a day (TID) | ORAL | Status: DC

## 2014-06-12 NOTE — Addendum Note (Signed)
Addended by: Sharmon Revere on: 06/12/2014 12:05 PM   Modules accepted: Orders

## 2014-06-12 NOTE — Progress Notes (Signed)
Subjective:    Patient ID: Anthony Wolfe, male    DOB: 11-24-1945, 68 y.o.   MRN: [id number]  HPI  Patient was recently hospitalized after a heart attack and required an implantable cardiac defibrillator for ventricular tachycardia. Shortly thereafter he developed pneumonia and was started on antibiotics. After beginning antibiotics the patient has developed explosive diarrhea he is having 4 and 5 copious watery bowel movements a day. He reports nausea and crampy abdominal pain. All of this began after the antibiotics. He denies any fever. He does report nausea. He denies any vomiting. He recently had therapeutic thoracentesis of a pleural effusion.. On examination today he is not short of breath. His lungs are clear to auscultation bilaterally. However the patient's blood pressure is very low. He is dizzy. He appears dehydrated. He is currently taking Lasix 40 mg every day for congestive heart failure and is also on lisinopril. Past Medical History  Diagnosis Date  . Hyperlipidemia   . HTN (hypertension)   . Allergic rhinitis   . Prostate hypertrophy   . Anxiety   . CAD (coronary artery disease)     a. s/p 3v CABG 2010. b. NSTEMI 05/2014 in setting of VT. Cath impressions: "Recent IMI with occluded SVG to RCA. Has Right to Right collaterals and collaterals from septal and OM. EF 40%."  . TIA (transient ischemic attack)     ASPVD, S./P. right CEA, 2002, and known occluded left carotid  . 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 Ischemic cardiomyopathy     a. Prior EF 36%. b. 2014: 50%. c. 05/2014: EF 35-40% by echo, 40% with inf HK by cath.  . Carotid disease, bilateral     a. Right CEA 2002; known occluded left carotid.  . Depression   . PVC's (premature ventricular contractions)     a. PVC's with prior Holter showing PVC load of 22%.  . 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 Pneumonia 05/23/2014  . Ventricular tachycardia     a. Admitted with VT 05/2013 - EPS with inducible  sustained monomorphic VT; s/p Medtronic ICD 05/21/14.  . Emphysema of lung   . NSTEMI (non-ST elevation myocardial infarction) 05/2014  . Pleural effusion 05/2014  . CHF (congestive heart failure)    Past Surgical History  Procedure Laterality Date  . Coronary artery bypass graft  02/2009    3 vessel  . Colonoscopy  07/2010  . Carotidectomy  2003    right side  . Back surgery    . Electrophysiologic study  05/21/14  . Ep implantable device  05/21/14    single chamber Metronic ICD   Current Outpatient Prescriptions on File Prior to Visit  Medication Sig Dispense Refill  . acetaminophen (TYLENOL) 325 MG tablet Take 1-2 tablets (325-650 mg total) by mouth every 4 (four) hours as needed for mild pain.    Marland Kitchen aspirin 81 MG chewable tablet Chew 1 tablet (81 mg total) by mouth daily.    Marland Kitchen atorvastatin (LIPITOR) 80 MG tablet Take 1 tablet (80 mg total) by mouth daily at 6 PM. 30 tablet 0  . clopidogrel (PLAVIX) 75 MG tablet Take 1 tablet (75 mg total) by mouth daily. 30 tablet 0  . dasatinib (SPRYCEL) 100 MG tablet Take 100 mg by mouth daily.    . furosemide (LASIX) 40 MG tablet Take 1 tablet (40 mg total) by mouth daily. 30 tablet 11  . guaiFENesin (MUCINEX) 600 MG 12 hr tablet Take 600 mg by mouth 2 (two) times daily  as needed for cough or to loosen phlegm.    Marland Kitchen lisinopril (PRINIVIL,ZESTRIL) 5 MG tablet Take 1 tablet (5 mg total) by mouth daily. 30 tablet 0  . metoprolol (LOPRESSOR) 100 MG tablet Take 1 tablet (100 mg total) by mouth 2 (two) times daily. 60 tablet 6  . nitroGLYCERIN (NITROSTAT) 0.4 MG SL tablet Place 1 tablet (0.4 mg total) under the tongue every 5 (five) minutes as needed for chest pain. 25 tablet 4  . potassium chloride SA (K-DUR,KLOR-CON) 20 MEQ tablet Take 1 tablet (20 mEq total) by mouth daily. 30 tablet 11  . saccharomyces boulardii (FLORASTOR) 250 MG capsule Take 1 capsule (250 mg total) by mouth 2 (two) times daily. 20 capsule 0   No current facility-administered  medications on file prior to visit.   Allergies  Allergen Reactions  . Norvasc [Amlodipine Besylate] Rash    rash  . Sulfa Antibiotics Hives    Hives    History   Social History  . Marital Status: Married    Spouse Name: N/A    Number of Children: 2  . Years of Education: N/A   Occupational History  .      retired Development worker, [employer]   Social History Main Topics  . Smoking status: Former Smoker    Quit date: 08/08/1997  . Smokeless tobacco: Former Systems developer  . Alcohol Use: No  . Drug Use: No  . Sexual Activity: Not on file   Other Topics Concern  . Not on file   Social History Narrative     Review of Systems  All other systems reviewed and are negative.      Objective:   Physical Exam  Constitutional: He appears well-developed and well-nourished.  Cardiovascular: Normal rate, regular rhythm and normal heart sounds.   Pulmonary/Chest: Effort normal and breath sounds normal. No respiratory distress. He has no wheezes. He has no rales.  Abdominal: Soft. Bowel sounds are normal. He exhibits distension. There is tenderness. There is no rebound and no guarding.  Vitals reviewed.         Assessment & Plan:  Diarrhea - Plan: metroNIDAZOLE (FLAGYL) 500 MG tablet  Hospital discharge follow-up  I am very concerned the patient may have C. Difficile colitis. I want him to start Flagyl 500 mg by mouth 3 times a day. I will do this for 14 days. I will check stool studies for C. Difficile 3 as well as GI pathogen panel. I will also start the patient on florastor 250 grams by mouth twice a day. I recommended that he temporarily discontinue lisinopril as he appears dehydrated to avoid potential acute renal insufficiency. I will recheck the patient on Tuesday or immediately if worse.

## 2014-06-13 LAB — BODY FLUID CULTURE: CULTURE: NO GROWTH

## 2014-06-13 LAB — CLOSTRIDIUM DIFFICILE BY PCR
CDIFFPCR: DETECTED — AB
CDIFFPCR: DETECTED — AB
Toxigenic C. Difficile by PCR: DETECTED — CR

## 2014-06-16 ENCOUNTER — Ambulatory Visit (INDEPENDENT_AMBULATORY_CARE_PROVIDER_SITE_OTHER): Payer: Commercial Managed Care - HMO | Admitting: Physician Assistant

## 2014-06-16 ENCOUNTER — Encounter: Payer: Self-pay | Admitting: Physician Assistant

## 2014-06-16 VITALS — BP 110/52 | HR 59 | Ht 69.0 in | Wt 183.1 lb

## 2014-06-16 DIAGNOSIS — J90 Pleural effusion, not elsewhere classified: Secondary | ICD-10-CM

## 2014-06-16 DIAGNOSIS — I5023 Acute on chronic systolic (congestive) heart failure: Secondary | ICD-10-CM

## 2014-06-16 DIAGNOSIS — I739 Peripheral vascular disease, unspecified: Secondary | ICD-10-CM

## 2014-06-16 DIAGNOSIS — I779 Disorder of arteries and arterioles, unspecified: Secondary | ICD-10-CM

## 2014-06-16 DIAGNOSIS — J439 Emphysema, unspecified: Secondary | ICD-10-CM

## 2014-06-16 DIAGNOSIS — J948 Other specified pleural conditions: Secondary | ICD-10-CM

## 2014-06-16 DIAGNOSIS — A047 Enterocolitis due to Clostridium difficile: Secondary | ICD-10-CM

## 2014-06-16 DIAGNOSIS — A0472 Enterocolitis due to Clostridium difficile, not specified as recurrent: Secondary | ICD-10-CM

## 2014-06-16 DIAGNOSIS — E785 Hyperlipidemia, unspecified: Secondary | ICD-10-CM

## 2014-06-16 DIAGNOSIS — I472 Ventricular tachycardia, unspecified: Secondary | ICD-10-CM

## 2014-06-16 DIAGNOSIS — I2510 Atherosclerotic heart disease of native coronary artery without angina pectoris: Secondary | ICD-10-CM

## 2014-06-16 HISTORY — DX: Enterocolitis due to Clostridium difficile, not specified as recurrent: A04.72

## 2014-06-16 LAB — BASIC METABOLIC PANEL
BUN: 14 mg/dL (ref 6–23)
CALCIUM: 8.7 mg/dL (ref 8.4–10.5)
CO2: 26 meq/L (ref 19–32)
CREATININE: 1.4 mg/dL (ref 0.4–1.5)
Chloride: 108 mEq/L (ref 96–112)
GFR: 55.47 mL/min — ABNORMAL LOW (ref >60.00)
GLUCOSE: 131 mg/dL — AB (ref 70–99)
Potassium: 4.3 mEq/L (ref 3.5–5.1)
Sodium: 141 mEq/L (ref 135–145)

## 2014-06-16 NOTE — Patient Instructions (Signed)
Your physician recommends that have lab work today: BMET  Your physician recommends that you return for [facility] lab work : 07/04/14- lipid/ CMET  Your physician has requested that you have a carotid duplex. This test is an ultrasound of the carotid arteries in your neck. It looks at blood flow through these arteries that supply the brain with blood. Allow one hour for this exam. There are no restrictions or special instructions.  Your physician recommends that you keep your follow-up appointment: 07/04/14 with Dr. Caryl Comes.

## 2014-06-16 NOTE — Progress Notes (Addendum)
[facility], [place], [place]  [address] Phone: [phone] Fax:  [phone]  Date:  06/16/2014   Patient ID:  Anthony Wolfe, DOB 1945-09-06, MRN [id number]   PCP:  Odette Fraction, MD  Cardiologist: Radford Pax  Electrophysiologist: Caryl Comes  History of Present Illness: Anthony Wolfe is a 68 y.o. male with history of CML, CAD (s/p CABG 2010, recent NSTEMI 05/2014), ischemic cardiomyopathy, HTN, HLD, remote long tobacco abuse with emphysema, carotid disease and recently diagnosed VT s/p ICD, and recent pleural effusion s/p thoracentesis who presents for hospital follow-up (recent readmission).  He was admitted 10/9-10/16 with an episode of VT while hunting requiring amio/DCCV by EMS. His troponin rose to 11 and he underwent cath showing occluded SVG to RCA, right to right collaterals and collaterals from septal and OM, EF 40% with inferior HK (Dr. Johnsie Cancel suspected recent IMI). 2D Echo 05/17/14 showed EF 35-40% with severe inferior HK, grade 2 d/d, mod dilated LA, mildly dilated RA, mildly reduced RV function with normal size, mildly dilated RA. Medical therapy was recommended for his CAD. EP was consulted who recommended EPS - this showed inducible sustained monomorphic VT and he subsequently underwent placement of a single chamber Medtronic ICD. Levaquin was added for pneumonia. He was instructed not to drive for 6 months. He was started on multiple medications including Plavix, Coreg, atorvastatin (switched from simva), and lisinopril. He saw me back in followup in October at which time he reported orthopnea and DOE. CXR showed a right pleural effusion and we attempted outpatient diuresis with close follow-up. We also switched Coreg to metoprolol with some improvement in dyspnea. However, despite a 5lb weight loss, he only had modest improvement in orthopnea and was admitted 11/2-11/3 for thoracentesis. Cytology showed reactive mesothelial cells with abnormal markers, negative culture.  ("POSITIVE FOR CALRETININ, CYTOKERATIN 5/6, AND WT-1.") CT chest 06/10/14 showed the pleural effusion as well as "Superimposed inflammatory appearing nodularity and peribronchovascular opacity in the right upper lobe, regressed since the 05/22/14 radiographs compatible with regressed pneumonia. Recommend an outpatient post treatment chest radiograph or chest CT to document full resolution."  In the interim since discharge he was diagnosed with C diff colitis on 06/12/14. At that [place], he was dizzy with low BP and appeared dehydrated. His PCP prescribed Flagyl/Florastor and advised that he hold lisinopril to avoid potential acute renal insufficiency.  He comes in today feeling markedly better from a breathing standpoint. His SOB essentially resolved as soon as they did the thoracentesis. No chest pain, orthopnea, dizziness, near syncope, or syncope. Diarrhea is slowing down.  Recent Labs: 08/12/2013: HDL Cholesterol by NMR 27*; LDL (calc) 67 06/02/2014: TSH 2.03 06/09/2014: ALT 11; Hemoglobin 12.2*; Pro B Natriuretic peptide (BNP) 2643.0* 06/10/2014: Creatinine 1.10; Potassium 3.6*  Wt Readings from Last 3 Encounters:  06/16/14 183 lb 1.9 oz (83.063 kg)  06/12/14 184 lb (83.462 kg)  06/10/14 181 lb 11.2 oz (82.419 kg)     Past Medical History  Diagnosis Date  . Hyperlipidemia   . HTN (hypertension)   . Allergic rhinitis   . Prostate hypertrophy   . Anxiety   . CAD (coronary artery disease)     a. s/p 3v CABG 2010. b. NSTEMI 05/2014 in setting of VT. Cath impressions: "Recent IMI with occluded SVG to RCA. Has Right to Right collaterals and collaterals from septal and OM. EF 40%."  . TIA (transient ischemic attack)     ASPVD, S./P. right CEA, 2002, and known occluded left carotid  .  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 Ischemic cardiomyopathy     a. Prior EF 36%. b. 2014: 50%. c. 05/2014: EF 35-40% by echo, 40% with inf HK by cath.  . Carotid disease, bilateral     a. Right CEA 2002;  known occluded left carotid.  . Depression   . PVC's (premature ventricular contractions)     a. PVC's with prior Holter showing PVC load of 22%.  . 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 Pneumonia 05/23/2014  . Ventricular tachycardia     a. Admitted with VT 05/2013 - EPS with inducible sustained monomorphic VT; s/p Medtronic ICD 05/21/14.  . Emphysema of lung   . NSTEMI (non-ST elevation myocardial infarction) 05/2014  . Pleural effusion 05/2014  . Chronic systolic CHF (congestive heart failure)   . C. difficile colitis 06/16/2014    Current Outpatient Prescriptions  Medication Sig Dispense Refill  . acetaminophen (TYLENOL) 325 MG tablet Take 1-2 tablets (325-650 mg total) by mouth every 4 (four) hours as needed for mild pain.    Marland Kitchen aspirin 81 MG chewable tablet Chew 1 tablet (81 mg total) by mouth daily.    Marland Kitchen atorvastatin (LIPITOR) 80 MG tablet Take 1 tablet (80 mg total) by mouth daily at 6 PM. 30 tablet 0  . clopidogrel (PLAVIX) 75 MG tablet Take 1 tablet (75 mg total) by mouth daily. 30 tablet 0  . dasatinib (SPRYCEL) 100 MG tablet Take 100 mg by mouth daily.    . furosemide (LASIX) 40 MG tablet Take 1 tablet (40 mg total) by mouth daily. 30 tablet 11  . guaiFENesin (MUCINEX) 600 MG 12 hr tablet Take 600 mg by mouth 2 (two) times daily as needed for cough or to loosen phlegm.    Marland Kitchen lisinopril (PRINIVIL,ZESTRIL) 5 MG tablet Take 1 tablet (5 mg total) by mouth daily. 30 tablet 0  . metoprolol (LOPRESSOR) 100 MG tablet Take 1 tablet (100 mg total) by mouth 2 (two) times daily. 60 tablet 6  . metroNIDAZOLE (FLAGYL) 500 MG tablet Take 1 tablet (500 mg total) by mouth 3 (three) times daily. 42 tablet 0  . nitroGLYCERIN (NITROSTAT) 0.4 MG SL tablet Place 1 tablet (0.4 mg total) under the tongue every 5 (five) minutes as needed for chest pain. 25 tablet 4  . potassium chloride SA (K-DUR,KLOR-CON) 20 MEQ tablet Take 1 tablet (20 mEq total) by mouth daily. 30 tablet 11  . saccharomyces  boulardii (FLORASTOR) 250 MG capsule Take 1 capsule (250 mg total) by mouth 2 (two) times daily. 20 capsule 0   No current facility-administered medications for this visit.    Allergies:   Coreg; Norvasc; and Sulfa antibiotics   Social History:  The patient  reports that he quit smoking about 16 years ago. He has quit using smokeless tobacco. He reports that he does not drink alcohol or use illicit drugs.   Family History:  The patient's family history includes Alcohol abuse in his father; Heart disease in his mother.   ROS:  Please see the history of present illness.All other systems reviewed and negative.   PHYSICAL EXAM:  VS:  BP 110/52 mmHg  Pulse 59  Ht 5\' 9"  (1.753 m)  Wt 183 lb 1.9 oz (83.063 kg)  BMI 27.03 kg/m2 Well nourished, well developed WM, in no acute distress HEENT: normal Neck: no JVD Cardiac:  normal S1, S2; RRR; no murmur Lungs:  mildly diminished BS at R base, no wheezing, rhonchi or rales Abd: soft, nontender, no hepatomegaly  Ext: no edema Skin: warm and dry Neuro:  moves all extremities spontaneously, no focal abnormalities noted  ASSESSMENT AND PLAN:  1. Moderate right pleural effusion, possibly due to acute on chronic systolic CHF/ICM - s/p recent thoracentesis. He has symptomatically improved. He still has decreased BS in the right base but is still aerating better than the last time I saw him. For now will continue Lasix at present dose since he is doing well clinically. However, will check a BMET given his recent hypokalemia in the hospital and to assess kidney function. His lisinopril is on hold per PCP - he will see him tomorrow to determine if ready for resumption. The BMET may help guide this decision. I told the patient to observe for recurrent symptoms. If effusion recurs to the degree that it did before, would refer to pulmonology. See above regarding abnormal CT. Once he is over the hump of his recent acute illnesses, would recommend consideration of  repeat imaging to ensure resolution of aforementioned abnormality. This can be considered at his next f/u appointment. Addendum: Dr. 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 2. COPD/emphysema - doing better after switch from Coreg to metoprolol. 3. C diff colitis - on 14-day treatment course. Has f/u tomorrow with PCP. 4. CAD s/p CABG in 2010, NSTEMI 05/2014 treated medically based on cath findings - continue aspirin, Plavix, statin, beta blocker. Will defer ultimate duration of Plavix to primary cardiologist in follow-up. 5. Hyperlipidemia - statin was advanced during recent admission. Would check CMET/lipids at [facility] 07/04/14.  6. Carotid artery disease - repeat duplex was cancelled due to hospital admission. Will offer to reschedule. 7. Ventricular tachycardia - s/p recent ICD implantation with normal interrogation on 06/02/14. F/u EP as planned.  Dispo: F/u as scheduled 07/04/14 with Dr. Caryl Comes.  Signed, Melina Copa, PA-C  06/16/2014 2:04 PM

## 2014-06-17 ENCOUNTER — Ambulatory Visit: Payer: Medicare HMO | Admitting: Family Medicine

## 2014-06-17 ENCOUNTER — Telehealth: Payer: Self-pay | Admitting: Pulmonary Disease

## 2014-06-17 DIAGNOSIS — J90 Pleural effusion, not elsewhere classified: Secondary | ICD-10-CM

## 2014-06-17 DIAGNOSIS — J439 Emphysema, unspecified: Secondary | ICD-10-CM

## 2014-06-17 NOTE — Telephone Encounter (Signed)
Spoke with patient wife, explained below per VS Scheduled pt for appt with TP for HFU at 10:30 on 06/23/14. Pt aware to arrive at 10am for CXR, order has been placed.  Nothing further needed.

## 2014-06-17 NOTE — Telephone Encounter (Signed)
Please schedule him for HFU of pleural effusion, COPD/emphysema, and abnormal CT chest.  Okay to schedule with Tammy Parrett if I don't have any openings soon.  He will need CXR at time of his visit.  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    Rt pleural effusion 06/09/14 >> glucose 100, protein 3.7, LDH 108, WBC 517 (90% lymphs), cytology >> reactive mesothelial cells positive for calretinin, cytokeratin 5/6, WT-1  Serum labs 06/09/14: Protein - 6.6 LDH - 121

## 2014-06-18 ENCOUNTER — Telehealth: Payer: Self-pay | Admitting: Family Medicine

## 2014-06-18 NOTE — Telephone Encounter (Signed)
Sherry from [facility] pulmonary calling to say that this patient needs Jeralyn Bennett referral for appointment next week  Please call her at [phone]

## 2014-06-19 ENCOUNTER — Encounter: Payer: Self-pay | Admitting: Family Medicine

## 2014-06-19 ENCOUNTER — Ambulatory Visit (INDEPENDENT_AMBULATORY_CARE_PROVIDER_SITE_OTHER): Payer: Medicare HMO | Admitting: Family Medicine

## 2014-06-19 VITALS — BP 140/68 | HR 60 | Temp 98.3°F | Resp 16 | Ht 69.0 in | Wt 184.0 lb

## 2014-06-19 DIAGNOSIS — E860 Dehydration: Secondary | ICD-10-CM

## 2014-06-19 DIAGNOSIS — A047 Enterocolitis due to Clostridium difficile: Secondary | ICD-10-CM

## 2014-06-19 DIAGNOSIS — A0472 Enterocolitis due to Clostridium difficile, not specified as recurrent: Secondary | ICD-10-CM

## 2014-06-19 NOTE — Telephone Encounter (Signed)
Called [facility] and spoke with Claiborne Billings and stated needed [facility] referral submitted, Submitted referral thru acuity connect with Authorization number [id number], will fax over authorization once receive hard copy.

## 2014-06-19 NOTE — Progress Notes (Signed)
Subjective:    Patient ID: Anthony Wolfe, male    DOB: 20-Dec-1945, 68 y.o.   MRN: [id number]  Dizziness   06/12/14 Patient was recently hospitalized after a heart attack and required an implantable cardiac defibrillator for ventricular tachycardia. Shortly thereafter he developed pneumonia and was started on antibiotics. After beginning antibiotics the patient has developed explosive diarrhea he is having 4 and 5 copious watery bowel movements a day. He reports nausea and crampy abdominal pain. All of this began after the antibiotics. He denies any fever. He does report nausea. He denies any vomiting. He recently had therapeutic thoracentesis of a pleural effusion.. On examination today he is not short of breath. His lungs are clear to auscultation bilaterally. However the patient's blood pressure is very low. He is dizzy. He appears dehydrated. He is currently taking Lasix 40 mg every day for congestive heart failure and is also on lisinopril.  At that time, my plan was: I am very concerned the patient may have C. Difficile colitis. I want him to start Flagyl 500 mg by mouth 3 times a day. I will do this for 14 days. I will check stool studies for C. Difficile 3 as well as GI pathogen panel. I will also start the patient on florastor 250 grams by mouth twice a day. I recommended that he temporarily discontinue lisinopril as he appears dehydrated to avoid potential acute renal insufficiency. I will recheck the patient on Tuesday or immediately if worse.  06/19/14 Patient is now doing much better since starting the Flagyl. He is having no further diarrhea. All 3 stool samples were positive for Clostridium difficile. Clinically he is much better. He is eating. He is drinking more. He continues to hold the lisinopril. His blood pressures now slightly elevated at 140/68. His heart doctor checked his creatinine on the ninth and found it elevated at 1.14. I would like to recheck this today.. Past Medical  History  Diagnosis Date  . Hyperlipidemia   . HTN (hypertension)   . Allergic rhinitis   . Prostate hypertrophy   . Anxiety   . CAD (coronary artery disease)     a. s/p 3v CABG 2010. b. NSTEMI 05/2014 in setting of VT. Cath impressions: "Recent IMI with occluded SVG to RCA. Has Right to Right collaterals and collaterals from septal and OM. EF 40%."  . TIA (transient ischemic attack)     ASPVD, S./P. right CEA, 2002, and known occluded left carotid  . 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 Ischemic cardiomyopathy     a. Prior EF 36%. b. 2014: 50%. c. 05/2014: EF 35-40% by echo, 40% with inf HK by cath.  . Carotid disease, bilateral     a. Right CEA 2002; known occluded left carotid.  . Depression   . PVC's (premature ventricular contractions)     a. PVC's with prior Holter showing PVC load of 22%.  . 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 Pneumonia 05/23/2014  . Ventricular tachycardia     a. Admitted with VT 05/2013 - EPS with inducible sustained monomorphic VT; s/p Medtronic ICD 05/21/14.  . Emphysema of lung   . NSTEMI (non-ST elevation myocardial infarction) 05/2014  . Pleural effusion 05/2014  . Chronic systolic CHF (congestive heart failure)   . C. difficile colitis 06/16/2014   Past Surgical History  Procedure Laterality Date  . Coronary artery bypass graft  02/2009    3 vessel  . Colonoscopy  07/2010  . Carotidectomy  2003  right side  . Back surgery    . Electrophysiologic study  05/21/14  . Ep implantable device  05/21/14    single chamber Metronic ICD   Current Outpatient Prescriptions on File Prior to Visit  Medication Sig Dispense Refill  . acetaminophen (TYLENOL) 325 MG tablet Take 1-2 tablets (325-650 mg total) by mouth every 4 (four) hours as needed for mild pain.    Marland Kitchen aspirin 81 MG chewable tablet Chew 1 tablet (81 mg total) by mouth daily.    Marland Kitchen atorvastatin (LIPITOR) 80 MG tablet Take 1 tablet (80 mg total) by mouth daily at 6 PM. 30 tablet 0    . clopidogrel (PLAVIX) 75 MG tablet Take 1 tablet (75 mg total) by mouth daily. 30 tablet 0  . dasatinib (SPRYCEL) 100 MG tablet Take 100 mg by mouth daily.    . furosemide (LASIX) 40 MG tablet Take 1 tablet (40 mg total) by mouth daily. 30 tablet 11  . guaiFENesin (MUCINEX) 600 MG 12 hr tablet Take 600 mg by mouth 2 (two) times daily as needed for cough or to loosen phlegm.    . metoprolol (LOPRESSOR) 100 MG tablet Take 1 tablet (100 mg total) by mouth 2 (two) times daily. 60 tablet 6  . metroNIDAZOLE (FLAGYL) 500 MG tablet Take 1 tablet (500 mg total) by mouth 3 (three) times daily. 42 tablet 0  . nitroGLYCERIN (NITROSTAT) 0.4 MG SL tablet Place 1 tablet (0.4 mg total) under the tongue every 5 (five) minutes as needed for chest pain. 25 tablet 4  . potassium chloride SA (K-DUR,KLOR-CON) 20 MEQ tablet Take 1 tablet (20 mEq total) by mouth daily. 30 tablet 11  . saccharomyces boulardii (FLORASTOR) 250 MG capsule Take 1 capsule (250 mg total) by mouth 2 (two) times daily. 20 capsule 0   No current facility-administered medications on file prior to visit.   Allergies  Allergen Reactions  . Coreg [Carvedilol]     Changed to metoprolol 05/2014 due to dyspnea/COPD  . Norvasc [Amlodipine Besylate] Rash    rash  . Sulfa Antibiotics Hives    Hives    History   Social History  . Marital Status: Married    Spouse Name: N/A    Number of Children: 2  . Years of Education: N/A   Occupational History  .      retired Development worker, [employer]   Social History Main Topics  . Smoking status: Former Smoker    Quit date: 08/08/1997  . Smokeless tobacco: Former Systems developer  . Alcohol Use: No  . Drug Use: No  . Sexual Activity: Not on file   Other Topics Concern  . Not on file   Social History Narrative     Review of Systems  Neurological: Positive for dizziness.  All other systems reviewed and are negative.      Objective:   Physical Exam  Constitutional: He appears well-developed and  well-nourished.  Cardiovascular: Normal rate, regular rhythm and normal heart sounds.   Pulmonary/Chest: Effort normal and breath sounds normal. No respiratory distress. He has no wheezes. He has no rales.  Abdominal: Soft. Bowel sounds are normal. He exhibits no distension. There is no tenderness. There is no rebound and no guarding.  Vitals reviewed.         Assessment & Plan:  Dehydration - Plan: BASIC METABOLIC PANEL WITH GFR  C. difficile colitis  Clinically the patient is resolved. I want him to finish the full 2 week therapy for C. Difficile colitis. Afterwards I  would like him to continue the florastor for 1 month.  I will recheck a BMP today. If his creatinine is better I will resume the lisinopril next week.Marland Kitchen

## 2014-06-20 LAB — BASIC METABOLIC PANEL WITH GFR
BUN: 14 mg/dL (ref 6–23)
CO2: 28 mEq/L (ref 19–32)
Calcium: 8.9 mg/dL (ref 8.4–10.5)
Chloride: 107 mEq/L (ref 96–112)
Creat: 1.16 mg/dL (ref 0.50–1.35)
GFR, EST AFRICAN AMERICAN: 73 mL/min
GFR, Est Non African American: 63 mL/min
Glucose, Bld: 98 mg/dL (ref 70–99)
Potassium: 4.3 mEq/L (ref 3.5–5.3)
Sodium: 141 mEq/L (ref 135–145)

## 2014-06-21 DIAGNOSIS — I10 Essential (primary) hypertension: Secondary | ICD-10-CM

## 2014-06-21 DIAGNOSIS — R5383 Other fatigue: Secondary | ICD-10-CM

## 2014-06-21 DIAGNOSIS — I222 Subsequent non-ST elevation (NSTEMI) myocardial infarction: Secondary | ICD-10-CM

## 2014-06-21 DIAGNOSIS — E785 Hyperlipidemia, unspecified: Secondary | ICD-10-CM

## 2014-06-21 DIAGNOSIS — R0600 Dyspnea, unspecified: Secondary | ICD-10-CM

## 2014-06-21 DIAGNOSIS — I214 Non-ST elevation (NSTEMI) myocardial infarction: Secondary | ICD-10-CM

## 2014-06-21 DIAGNOSIS — I472 Ventricular tachycardia: Secondary | ICD-10-CM

## 2014-06-23 ENCOUNTER — Encounter: Payer: Self-pay | Admitting: Physician Assistant

## 2014-06-23 ENCOUNTER — Ambulatory Visit (INDEPENDENT_AMBULATORY_CARE_PROVIDER_SITE_OTHER): Payer: Commercial Managed Care - HMO | Admitting: Adult Health

## 2014-06-23 ENCOUNTER — Telehealth: Payer: Self-pay | Admitting: Internal Medicine

## 2014-06-23 ENCOUNTER — Ambulatory Visit (INDEPENDENT_AMBULATORY_CARE_PROVIDER_SITE_OTHER)
Admission: RE | Admit: 2014-06-23 | Discharge: 2014-06-23 | Disposition: A | Discharge status: Home / Self Care | Payer: Commercial Managed Care - HMO | Source: Ambulatory Visit | Attending: Pulmonary Disease | Admitting: Pulmonary Disease

## 2014-06-23 ENCOUNTER — Encounter: Payer: Self-pay | Admitting: Adult Health

## 2014-06-23 ENCOUNTER — Ambulatory Visit (INDEPENDENT_AMBULATORY_CARE_PROVIDER_SITE_OTHER): Payer: Commercial Managed Care - HMO | Admitting: Physician Assistant

## 2014-06-23 VITALS — BP 88/64 | HR 57 | Temp 97.2°F | Ht 69.0 in | Wt 195.6 lb

## 2014-06-23 VITALS — BP 100/60 | HR 67 | Ht 69.0 in | Wt 183.4 lb

## 2014-06-23 DIAGNOSIS — I951 Orthostatic hypotension: Secondary | ICD-10-CM | POA: Insufficient documentation

## 2014-06-23 DIAGNOSIS — R55 Syncope and collapse: Secondary | ICD-10-CM

## 2014-06-23 DIAGNOSIS — J948 Other specified pleural conditions: Secondary | ICD-10-CM

## 2014-06-23 DIAGNOSIS — I10 Essential (primary) hypertension: Secondary | ICD-10-CM

## 2014-06-23 DIAGNOSIS — J439 Emphysema, unspecified: Secondary | ICD-10-CM

## 2014-06-23 DIAGNOSIS — J90 Pleural effusion, not elsewhere classified: Secondary | ICD-10-CM

## 2014-06-23 DIAGNOSIS — I959 Hypotension, unspecified: Secondary | ICD-10-CM

## 2014-06-23 DIAGNOSIS — J189 Pneumonia, unspecified organism: Secondary | ICD-10-CM

## 2014-06-23 DIAGNOSIS — I255 Ischemic cardiomyopathy: Secondary | ICD-10-CM

## 2014-06-23 LAB — BASIC METABOLIC PANEL
BUN: 13 mg/dL (ref 6–23)
CALCIUM: 8.7 mg/dL (ref 8.4–10.5)
CO2: 27 mEq/L (ref 19–32)
Chloride: 109 mEq/L (ref 96–112)
Creatinine, Ser: 1.3 mg/dL (ref 0.4–1.5)
GFR: 60.62 mL/min (ref >60.00)
GLUCOSE: 109 mg/dL — AB (ref 70–99)
POTASSIUM: 4.2 meq/L (ref 3.5–5.1)
SODIUM: 143 meq/L (ref 135–145)

## 2014-06-23 MED ORDER — FUROSEMIDE 20 MG PO TABS: 20.0000 mg | ORAL_TABLET | Freq: Every day | ORAL | Status: DC

## 2014-06-23 NOTE — Assessment & Plan Note (Addendum)
See notes under orthostatic hypotension.  This is likely the cause of patient's syncopal episode.  Patient had thoracentesis recently and was continued on Lasix.   He had a dramatic improvement in urinary output after the procedure which likely dehydrated him.  He was also just restarted on lisinopril and has known carotid disease as well.  He was instructed to make slow changes in position until rehydrated.

## 2014-06-23 NOTE — Assessment & Plan Note (Signed)
Right sided Pleural Effusion s/p throacentesis w/ cytology showing mesothelial cells  In setting of decompensated CHF , VT and NSTEMI  Effusion is improved ,  Continues area of right hilar nodularity ? Previous PNA  Will cont to follow this area on serial CXR   Plan  Follow up with Dr. Halford Chessman  In 4-6 weeks with chest xray  Please contact office for sooner follow up if symptoms do not improve or worsen or seek emergency care

## 2014-06-23 NOTE — Progress Notes (Signed)
Date:  06/23/2014   ID:  Meade Maw, DOB Jun 06, 1946, MRN [id number]  PCP:  Odette Fraction, MD  Primary Cardiologist:  Radford Pax EP:  Caryl Comes    History of Present Illness: Anthony Wolfe is a 68 y.o. male with history of CML, CAD (s/p CABG 2010, recent NSTEMI 05/2014), ischemic cardiomyopathy, HTN, HLD, remote long tobacco abuse with emphysema, carotid disease and recently diagnosed VT s/p ICD, and recent pleural effusion s/p thoracentesis who presents for hospital follow-up (recent readmission).  He was admitted 10/9-10/16 with an episode of VT while hunting requiring amio/DCCV by EMS. His troponin rose to 11 and he underwent cath showing occluded SVG to RCA, right to right collaterals and collaterals from septal and OM, EF 40% with inferior HK (Dr. Johnsie Cancel suspected recent IMI). 2D Echo 05/17/14 showed EF 35-40% with severe inferior HK, grade 2 d/d, mod dilated LA, mildly dilated RA, mildly reduced RV function with normal size, mildly dilated RA. Medical therapy was recommended for his CAD. EP was consulted who recommended EPS - this showed inducible sustained monomorphic VT and he subsequently underwent placement of a single chamber Medtronic ICD. Levaquin was added for pneumonia. He was instructed not to drive for 6 months. He was started on multiple medications including Plavix, Coreg, atorvastatin (switched from simva), and lisinopril. He saw Cathie Hoops, PAC back in followup in October at which time he reported orthopnea and DOE. CXR showed a right pleural effusion and we attempted outpatient diuresis with close follow-up. We also switched Coreg to metoprolol with some improvement in dyspnea. However, despite a 5lb weight loss, he only had modest improvement in orthopnea and was admitted 11/2-11/3 for thoracentesis. Cytology showed reactive mesothelial cells with abnormal markers, negative culture. ("POSITIVE FOR CALRETININ, CYTOKERATIN 5/6, AND WT-1.") CT chest 06/10/14 showed the pleural  effusion as well as "Superimposed inflammatory appearing nodularity and peribronchovascular opacity in the right upper lobe, regressed since the 05/22/14 radiographs compatible with regressed pneumonia. Recommend an outpatient post treatment chest radiograph or chest CT to document full resolution."  In the interim since discharge he was diagnosed with C diff colitis on 06/12/14. At that [place], he was dizzy with low BP and appeared dehydrated. His PCP prescribed Flagyl/Florastor and advised that he hold lisinopril to avoid potential acute renal insufficiency.  Patient is here today for evaluation of syncope. Reports that on Saturday at around 7:00 he stood up from the couch started walking across the room when he suddenly syncopized, hitting his head on a cast iron heater in the living room.  Within seconds he woke up and stood straight up as though nothing had happened.  He has multiple abrasions on the left side of his head.  He reports feeling dizzy just before  passing out.  Also reports multiple episodes of feeling dizzy after standing up prior to the incident.  He said after they took the fluid out of his lung he started urinating a lot more.  Before that he was only urinating once per day.   He not go to the emergency room. Reports no headaches or sequelae as a result of hitting his head. He reports no shock from his ICD.   The patient currently denies nausea, vomiting, fever, chest pain, shortness of breath, orthopnea, PND, cough, congestion, abdominal pain, hematochezia, melena, lower extremity edema, claudication.  Wt Readings from Last 3 Encounters:  06/23/14 183 lb 6.4 oz (83.19 kg)  06/23/14 195 lb 9.6 oz (88.724 kg)  06/19/14 184 lb (83.462 kg)  Past Medical History  Diagnosis Date  . Hyperlipidemia   . HTN (hypertension)   . Allergic rhinitis   . Prostate hypertrophy   . Anxiety   . CAD (coronary artery disease)     a. s/p 3v CABG 2010. b. NSTEMI 05/2014 in setting of VT. Cath  impressions: "Recent IMI with occluded SVG to RCA. Has Right to Right collaterals and collaterals from septal and OM. EF 40%."  . TIA (transient ischemic attack)     ASPVD, S./P. right CEA, 2002, and known occluded left carotid  . 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 Ischemic cardiomyopathy     a. Prior EF 36%. b. 2014: 50%. c. 05/2014: EF 35-40% by echo, 40% with inf HK by cath.  . Carotid disease, bilateral     a. Right CEA 2002; known occluded left carotid.  . Depression   . PVC's (premature ventricular contractions)     a. PVC's with prior Holter showing PVC load of 22%.  . 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 Pneumonia 05/23/2014  . Ventricular tachycardia     a. Admitted with VT 05/2013 - EPS with inducible sustained monomorphic VT; s/p Medtronic ICD 05/21/14.  . Emphysema of lung   . NSTEMI (non-ST elevation myocardial infarction) 05/2014  . Pleural effusion 05/2014  . Chronic systolic CHF (congestive heart failure)   . C. difficile colitis 06/16/2014    Current Outpatient Prescriptions  Medication Sig Dispense Refill  . acetaminophen (TYLENOL) 325 MG tablet Take 1-2 tablets (325-650 mg total) by mouth every 4 (four) hours as needed for mild pain.    Marland Kitchen aspirin 81 MG chewable tablet Chew 1 tablet (81 mg total) by mouth daily.    Marland Kitchen atorvastatin (LIPITOR) 80 MG tablet Take 1 tablet (80 mg total) by mouth daily at 6 PM. 30 tablet 0  . clopidogrel (PLAVIX) 75 MG tablet Take 1 tablet (75 mg total) by mouth daily. 30 tablet 0  . dasatinib (SPRYCEL) 100 MG tablet Take 100 mg by mouth daily.    . furosemide (LASIX) 20 MG tablet Take 1 tablet (20 mg total) by mouth daily. 30 tablet 9  . guaiFENesin (MUCINEX) 600 MG 12 hr tablet Take 600 mg by mouth 2 (two) times daily as needed for cough or to loosen phlegm.    . metoprolol (LOPRESSOR) 100 MG tablet Take 1 tablet (100 mg total) by mouth 2 (two) times daily. 60 tablet 6  . metroNIDAZOLE (FLAGYL) 500 MG tablet Take 1 tablet  (500 mg total) by mouth 3 (three) times daily. 42 tablet 0  . nitroGLYCERIN (NITROSTAT) 0.4 MG SL tablet Place 1 tablet (0.4 mg total) under the tongue every 5 (five) minutes as needed for chest pain. 25 tablet 4  . potassium chloride SA (K-DUR,KLOR-CON) 20 MEQ tablet Take 1 tablet (20 mEq total) by mouth daily. 30 tablet 11  . saccharomyces boulardii (FLORASTOR) 250 MG capsule Take 1 capsule (250 mg total) by mouth 2 (two) times daily. 20 capsule 0   No current facility-administered medications for this visit.    Allergies:    Allergies  Allergen Reactions  . Coreg [Carvedilol]     Changed to metoprolol 05/2014 due to dyspnea/COPD  . Norvasc [Amlodipine Besylate] Rash    rash  . Sulfa Antibiotics Hives    Hives     Social History:  The patient  reports that he quit smoking about 16 years ago. His smoking use included Cigarettes. He has a 135 pack-year smoking history. He  has quit using smokeless tobacco. He reports that he does not drink alcohol or use illicit drugs.   Family history:   Family History  Problem Relation Age of Onset  . Heart disease Mother   . Alcohol abuse Father     ROS:  Please see the history of present illness.  All other systems reviewed and negative.   PHYSICAL EXAM: VS:  BP 100/60 mmHg  Pulse 67  Ht 5\' 9"  (1.753 m)  Wt 183 lb 6.4 oz (83.19 kg)  BMI 27.07 kg/m2  SpO2 99% Well nourished, well developed, in no acute distress HEENT: Pupils are equal round react to light accommodation extraocular movements are intact.  Large area of "road rash" on his left forehead.  Healing  Neck: no JVDNo cervical lymphadenopathy. Cardiac: Regular rate and rhythm without murmurs rubs or gallops. Lungs:  clear to auscultation bilaterally, no wheezing, rhonchi or rales Abd: soft, nontender, positive bowel sounds all quadrants, no hepatosplenomegaly Ext: no lower extremity edema.  2+ radial and dorsalis pedis pulses. Skin: warm and dry Neuro:  Grossly  normal   ASSESSMENT AND PLAN:  Problem List Items Addressed This Visit    Cardiomyopathy, ischemic   Relevant Medications      furosemide (LASIX) tablet   HTN (hypertension) - Primary   Relevant Medications      furosemide (LASIX) tablet   Other Relevant Orders      Basic Metabolic Panel (BMET)   Orthostatic hypotension    Patient's blood pressure dropped from 100/60 to 80/50 from lying to sitting and then to 70/60 from sitting to standing.  I'm stopping his lisinopril and his Lasix.  He will continue to weigh himself daily. After he gained back the first 4-5 pounds he will then use that as his baseline weight.  Flovent dose Lasix 20 mg daily when necessary for weight gain of 3 pounds in 24 hours or 5 pounds in a week.  Can then consider adding back lisinopril.    Relevant Medications      furosemide (LASIX) tablet   Syncope    See notes under orthostatic hypotension.  This is likely the cause of patient's syncopal episode.  Patient had thoracentesis recently and was continued on Lasix.   He had a dramatic improvement in urinary output after the procedure which likely dehydrated him.  He was also just restarted on lisinopril and has known carotid disease as well.  He was instructed to make slow changes in position until rehydrated.    Relevant Medications      furosemide (LASIX) tablet    Other Visit Diagnoses    Pleural effusion        Relevant Medications       furosemide (LASIX) tablet

## 2014-06-23 NOTE — Patient Instructions (Addendum)
  Your physician has recommended you make the following change in your medication:  STOP LISINOPRIL DECREASE LASIX TO ( 20 MG ) DAILY UNLESS YOU HAVE WEIGHT GAIN OF 3 LBS IN 24 HOURS OR 5 LBS IN ONE WEEK. THAN TAKE EXTRA DOSE OF LASIX TILL WEIGHT COMES DOWN.  FIRST HYDRATE YOURSELF SLOWLY OVER THE NEXT FEW DAYS WHEN YOUR WEIGHT RESUMES TO NORMAL START WEIGHING DAILY.    Your physician recommends that you KEEP YOUR scheduled  follow-up appointment with Dr. Caryl Comes   Your physician recommends that you have lab work today: [employer]

## 2014-06-23 NOTE — Assessment & Plan Note (Signed)
Suspect secondary to orthostatic hypotension w/ from over medication with HTN /diuretic and recent C . Diff (probable /hypovolemic w/ renal insuffciecy )  Cardiology ov made for later today to discuss his HTN /CAD med/doses.   Plan  We are referring you to cardiology to discuss low blood pressures- today at 2:30 with Clabe Seal at Cardiology  Please contact office for sooner follow up if symptoms do not improve or worsen or seek emergency care

## 2014-06-23 NOTE — Assessment & Plan Note (Signed)
Patient's blood pressure dropped from 100/60 to 80/50 from lying to sitting and then to 70/60 from sitting to standing.  I'm stopping his lisinopril and his Lasix.  He will continue to weigh himself daily. After he gained back the first 4-5 pounds he will then use that as his baseline weight.  Flovent dose Lasix 20 mg daily when necessary for weight gain of 3 pounds in 24 hours or 5 pounds in a week.  Can then consider adding back lisinopril.

## 2014-06-23 NOTE — Telephone Encounter (Signed)
Per Rosine Beat to triage- we do not need to call the patient.

## 2014-06-23 NOTE — Patient Instructions (Addendum)
Follow up with Dr. Halford Chessman  In 4-6 weeks with chest xray  We are referring you to cardiology to discuss low blood pressures- today at 2:30 with Clabe Seal at Cardiology  Please contact office for sooner follow up if symptoms do not improve or worsen or seek emergency care

## 2014-06-23 NOTE — Progress Notes (Signed)
Subjective:    Patient ID: Anthony Wolfe, male    DOB: April 14, 1946, 68 y.o.   MRN: [id number]  HPI Anthony Wolfe is a 68 y.o. male with history of CML, CAD (s/p CABG 2010, recent NSTEMI 05/2014), ischemic cardiomyopathy, HTN, HLD, remote long tobacco abuse with emphysema, carotid disease and recently diagnosed VT s/p ICD, and recent pleural effusion s/p thoracentesis     06/23/2014 [facility] f.u  He was admitted 10/9-10/16 with an episode of VT while hunting requiring amio/DCCV. Found to have a NSTEMI w/ elevated troponin  underwent cath showing occluded SVG to RCA, right to right collaterals and collaterals from septal and OM, EF 40% with inferior HK  2D Echo  showed EF 35-40% with severe inferior HK, grade 2 d/d, mod dilated LA, mildly dilated RA, mildly reduced RV function with normal size, mildly dilated RA. Medical therapy was recommended  ICD was placed.   CXR showed a right sided pleural effusion  And underwent   thoracentesis. Cytology showed reactive mesothelial cells with abnormal markers, negative culture. ("POSITIVE FOR CALRETININ, CYTOKERATIN 5/6, AND WT-1.") Repeat CXR minimal right pleural effusion. Right perihilar nodularity persists   In the interim since discharge he was diagnosed with C diff colitis on 06/12/14. Tx w/ Flagyl . Diarrhea has gotten better.  Reports that 2 days ago , got lightheaded after standing up and passed out walking striking his head on stove. He woke up with wife yelling at him. Says he did not have speech or vision changes. No neck or back pain. No extremity weakness.  Says his blood pressure has been running low and was held briefly due to dizziness when he was dx with C. Diff.  He denies chest pain, palpitiations , headache or ICD shock .     Review of Systems Constitutional:   No  weight loss, night sweats,  Fevers, chills, +fatigue, or  lassitude.  HEENT:   No headaches,  Difficulty swallowing,  Tooth/dental problems, or  Sore throat,          No sneezing, itching, ear ache, nasal congestion, post nasal drip,   CV:  No chest pain,  Orthopnea, PND, swelling in lower extremities, anasarca, +dizziness,  syncope.   GI  No heartburn, indigestion, abdominal pain, nausea, vomiting, diarrhea, change in bowel habits, loss of appetite, bloody stools.   Resp:  No coughing up of blood.  No change in color of mucus.  No wheezing.  No chest wall deformity  Skin: no rash or lesions.  GU: no dysuria, change in color of urine, no urgency or frequency.  No flank pain, no hematuria   MS:  No joint pain or swelling.  No decreased range of motion.  No back pain.  Psych:  No change in mood or affect. No depression or anxiety.  No memory loss.         Objective:   Physical Exam GEN: A/Ox3; pleasant , NAD, frail   HEENT:  Lupus/AT,  EACs-clear, TMs-wnl, NOSE-clear, THROAT-clear, no lesions, no postnasal drip or exudate noted.   NECK:  Supple w/ fair ROM; no JVD; normal carotid impulses w/o bruits; no thyromegaly or nodules palpated; no lymphadenopathy.  RESP  Decreased BS in bases no accessory muscle use, no dullness to percussion  CARD:  RRR, no m/r/g  , no peripheral edema, pulses intact, no cyanosis or clubbing.  GI:   Soft & nt; nml bowel sounds; no organomegaly or masses detected. Rig Musco: Warm bil, no deformities or joint swelling  noted.   Neuro: alert, no focal deficits noted.    Skin: Warm, no lesions or rashes,abrasion along forehead          Assessment & Plan:

## 2014-06-23 NOTE — Telephone Encounter (Signed)
New message    Patient need to be seen today for hypotension. Black out over the weekend appt with Tenny Craw on  11/16@ 2:30 pm

## 2014-06-24 NOTE — Progress Notes (Signed)
Reviewed and agree with assessment/plan. 

## 2014-07-04 ENCOUNTER — Ambulatory Visit (INDEPENDENT_AMBULATORY_CARE_PROVIDER_SITE_OTHER): Payer: Commercial Managed Care - HMO | Admitting: Internal Medicine

## 2014-07-04 ENCOUNTER — Ambulatory Visit (HOSPITAL_COMMUNITY): Payer: Commercial Managed Care - HMO | Attending: Internal Medicine | Admitting: *Deleted

## 2014-07-04 VITALS — BP 116/58 | HR 60 | Ht 69.0 in | Wt 181.0 lb

## 2014-07-04 DIAGNOSIS — E785 Hyperlipidemia, unspecified: Secondary | ICD-10-CM | POA: Insufficient documentation

## 2014-07-04 DIAGNOSIS — I5023 Acute on chronic systolic (congestive) heart failure: Secondary | ICD-10-CM

## 2014-07-04 DIAGNOSIS — I472 Ventricular tachycardia: Secondary | ICD-10-CM

## 2014-07-04 DIAGNOSIS — I10 Essential (primary) hypertension: Secondary | ICD-10-CM | POA: Insufficient documentation

## 2014-07-04 DIAGNOSIS — J90 Pleural effusion, not elsewhere classified: Secondary | ICD-10-CM

## 2014-07-04 DIAGNOSIS — I6523 Occlusion and stenosis of bilateral carotid arteries: Secondary | ICD-10-CM

## 2014-07-04 DIAGNOSIS — Z720 Tobacco use: Secondary | ICD-10-CM | POA: Diagnosis not present

## 2014-07-04 DIAGNOSIS — I4729 Other ventricular tachycardia: Secondary | ICD-10-CM

## 2014-07-04 DIAGNOSIS — Z86718 Personal history of other venous thrombosis and embolism: Secondary | ICD-10-CM | POA: Diagnosis not present

## 2014-07-04 DIAGNOSIS — I255 Ischemic cardiomyopathy: Secondary | ICD-10-CM

## 2014-07-04 DIAGNOSIS — I739 Peripheral vascular disease, unspecified: Secondary | ICD-10-CM

## 2014-07-04 DIAGNOSIS — Z86711 Personal history of pulmonary embolism: Secondary | ICD-10-CM | POA: Insufficient documentation

## 2014-07-04 DIAGNOSIS — J449 Chronic obstructive pulmonary disease, unspecified: Secondary | ICD-10-CM | POA: Insufficient documentation

## 2014-07-04 DIAGNOSIS — I779 Disorder of arteries and arterioles, unspecified: Secondary | ICD-10-CM

## 2014-07-04 DIAGNOSIS — I6529 Occlusion and stenosis of unspecified carotid artery: Secondary | ICD-10-CM | POA: Diagnosis present

## 2014-07-04 LAB — MDC_IDC_ENUM_SESS_TYPE_INCLINIC
Battery Remaining Longevity: 136 mo
Battery Voltage: 3.14 V
HIGH POWER IMPEDANCE MEASURED VALUE: 51 Ohm
HighPow Impedance: 133 Ohm
Lead Channel Impedance Value: 399 Ohm
Lead Channel Pacing Threshold Amplitude: 0.5 V
Lead Channel Sensing Intrinsic Amplitude: 7 mV
Lead Channel Setting Pacing Amplitude: 2 V
Lead Channel Setting Pacing Pulse Width: 0.4 ms
Lead Channel Setting Sensing Sensitivity: 0.3 mV
MDC IDC MSMT LEADCHNL RV PACING THRESHOLD PULSEWIDTH: 0.4 ms
MDC IDC MSMT LEADCHNL RV SENSING INTR AMPL: 6.625 mV
MDC IDC SESS DTM: 20151127144746
MDC IDC SET ZONE DETECTION INTERVAL: 300 ms
MDC IDC SET ZONE DETECTION INTERVAL: 340 ms
MDC IDC STAT BRADY RV PERCENT PACED: 0.31 %
Zone Setting Detection Interval: 370 ms

## 2014-07-04 MED ORDER — CARVEDILOL 3.125 MG PO TABS: 3.1250 mg | ORAL_TABLET | Freq: Two times a day (BID) | ORAL | Status: DC

## 2014-07-04 MED ORDER — LISINOPRIL 2.5 MG PO TABS: 2.5000 mg | ORAL_TABLET | Freq: Every day | ORAL | Status: DC

## 2014-07-04 NOTE — Progress Notes (Signed)
Patient Care Team: [name], MD as PCP - General (Family Medicine) [name], MD as Attending Physician (Cardiology) [name], MD (Family Medicine) [name], MD (Cardiothoracic Surgery) [name], MD as Attending Physician (Vascular Surgery) [name], MD as Consulting Physician (Hematology and Oncology)   HPI  Anthony Wolfe is a 68 y.o. male Seen in follow-up for an ICD implanted for secondary prevention after he presented with ventricular tachycardia 10/15. Catheterization demonstrated occluded vein graft to his RCA ejection fraction was about 40%  He is status post bypass surgery 2010  He underwent EP testing and was found to be inducible.  He recently was seen because of syncope. He was found to be orthostatic.  Course was cx by CDiff. Diarrhea has now resolved.  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   He underwent a thoracentesis. He's had some residual discomfort. He continues to complain of shortness of breath.  Past Medical History  Diagnosis Date  . Hyperlipidemia   . HTN (hypertension)   . Allergic rhinitis   . Prostate hypertrophy   . Anxiety   . CAD (coronary artery disease)     a. s/p 3v CABG 2010. b. NSTEMI 05/2014 in setting of VT. Cath impressions: "Recent IMI with occluded SVG to RCA. Has Right to Right collaterals and collaterals from septal and OM. EF 40%."  . TIA (transient ischemic attack)     ASPVD, S./P. right CEA, 2002, and known occluded left carotid  . 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 Ischemic cardiomyopathy     a. Prior EF 36%. b. 2014: 50%. c. 05/2014: EF 35-40% by echo, 40% with inf HK by cath.  . Carotid disease, bilateral     a. Right CEA 2002; known occluded left carotid.  . Depression   . PVC's (premature ventricular contractions)     a. PVC's with prior Holter showing PVC load of 22%.  . 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 Pneumonia 05/23/2014  . Ventricular tachycardia     a. Admitted with VT 05/2013 - EPS with inducible sustained monomorphic VT; s/p Medtronic ICD 05/21/14.  . Emphysema of lung   . NSTEMI (non-ST elevation myocardial infarction) 05/2014  . Pleural effusion 05/2014  . Chronic systolic CHF (congestive heart failure)   . C. difficile colitis 06/16/2014    Past Surgical History  Procedure Laterality Date  . Coronary artery bypass graft  02/2009    3 vessel  . Colonoscopy  07/2010  . Carotidectomy  2003    right side  . Back surgery    . Electrophysiologic study  05/21/14  . Ep implantable device  05/21/14    single chamber Metronic ICD    Current Outpatient Prescriptions  Medication Sig Dispense Refill  . aspirin 81 MG chewable tablet Chew 1 tablet (81 mg total) by mouth daily.    Marland Kitchen atorvastatin (LIPITOR) 80 MG tablet Take 1 tablet (80 mg total) by mouth daily at 6 PM. 30 tablet 0  . clopidogrel (PLAVIX) 75 MG tablet Take 1 tablet (75 mg total) by mouth daily. 30 tablet 0  . dasatinib (SPRYCEL) 100 MG tablet Take 100 mg by mouth daily.    . furosemide (LASIX) 20 MG tablet Take 1 tablet (20 mg total) by mouth daily. 30 tablet 9  . guaiFENesin (MUCINEX) 600 MG 12 hr tablet Take 600 mg by mouth 2 (two) times daily as  needed for cough or to loosen phlegm.    . metoprolol (LOPRESSOR) 100 MG tablet Take 1 tablet (100 mg total) by mouth 2 (two) times daily. (Patient taking differently: Take 50 mg by mouth 2 (two) times daily. ) 60 tablet 6  . potassium chloride SA (K-DUR,KLOR-CON) 20 MEQ tablet Take 1 tablet (20 mEq total) by mouth daily. 30 tablet 11  . acetaminophen (TYLENOL) 325 MG tablet Take 1-2 tablets (325-650 mg total) by mouth every 4 (four) hours as needed for mild pain. (Patient not taking: Reported on 07/04/2014)    . nitroGLYCERIN (NITROSTAT) 0.4 MG SL tablet Place 1 tablet (0.4 mg total) under the tongue every 5 (five) minutes as needed for chest  pain. (Patient not taking: Reported on 07/04/2014) 25 tablet 4   No current facility-administered medications for this visit.    Allergies  Allergen Reactions  . Coreg [Carvedilol]     Changed to metoprolol 05/2014 due to dyspnea/COPD  . Norvasc [Amlodipine Besylate] Rash    rash  . Sulfa Antibiotics Hives    Hives     Review of Systems negative except from HPI and PMH  Physical Exam BP 116/58 mmHg  Pulse 60  Ht 5\' 9"  (1.753 m)  Wt 181 lb (82.101 kg)  BMI 26.72 kg/m2 Well developed and well nourished in no acute distress HENT normal E scleral and icterus clear Neck Supple JVP flat; carotids brisk and full Device pocket well healed; without hematoma or erythema.  There is no tethering   decreased breath sounds right lung  Regular rate and rhythm, no murmurs gallops or rub Soft with active bowel sounds No clubbing cyanosis  Edema Alert and oriented, grossly normal motor and sensory function Skin Warm and Dry   ECG demonstrates sinus rhythm at 60 Intervals 20/11/44   Assessment and  Plan   ventricular tachycardia   Syncope -orthostatic   Pleural effusion - ? Recurrent   ischemic cardiomyopathy depressed LV function and prior bypass   Implantable defibrillator-Medtronic   The patient has had no interval ventricular tachycardia   He continues with dyspnea on exertion. By examination he has markedly decreased breath sounds on his right lung, likely has consequence of recurrent pleural effusion  Chest x-ray 11/16 was reviewed and demonstrated a decrease effusion compared to 11/2. We will repeat it.  There is no evidence of volume overload.  His cardiomyopathy we will get him back on carvedilol based on the COMET data   As well as get him back on low-dose lisinopril.

## 2014-07-04 NOTE — Patient Instructions (Signed)
Your physician recommends that you schedule a follow-up appointment in: 3 MONTHS WITH DR Caryl Comes  STOP METOPROLOL  START CARVEDILOL 3.125 MG TWICE DAILY  START LISINOPRIL 2.5 MG ONCE DAILY  A chest x-ray takes a picture of the organs and structures inside the chest, including the heart, lungs, and blood vessels. This test can show several things, including, whether the heart is enlarges; whether fluid is building up in the lungs; and whether pacemaker / defibrillator leads are still in place. [address] [facility]=GO TO THE BASEMENT

## 2014-07-04 NOTE — Progress Notes (Signed)
Carotid duplex completed 

## 2014-07-07 ENCOUNTER — Telehealth: Payer: Self-pay | Admitting: Family Medicine

## 2014-07-07 DIAGNOSIS — R197 Diarrhea, unspecified: Secondary | ICD-10-CM

## 2014-07-07 NOTE — Telephone Encounter (Signed)
Pt aware.

## 2014-07-07 NOTE — Telephone Encounter (Signed)
Pt states that he has developed watery diarrhea again and is unable to eat. He did finish his antibx for cdiff and it got better but now has returned. He is not sure if that is what it is this time but he is losing weight and can not get diarrhea under control.

## 2014-07-07 NOTE — Telephone Encounter (Signed)
Resume florastor 250 bid asap and get stool cdiff  x3

## 2014-07-08 ENCOUNTER — Telehealth: Payer: Self-pay | Admitting: Internal Medicine

## 2014-07-08 ENCOUNTER — Ambulatory Visit (INDEPENDENT_AMBULATORY_CARE_PROVIDER_SITE_OTHER)
Admission: RE | Admit: 2014-07-08 | Discharge: 2014-07-08 | Disposition: A | Discharge status: Home / Self Care | Payer: Commercial Managed Care - HMO | Source: Ambulatory Visit | Attending: Internal Medicine | Admitting: Internal Medicine

## 2014-07-08 ENCOUNTER — Encounter: Payer: Self-pay | Admitting: Internal Medicine

## 2014-07-08 DIAGNOSIS — J90 Pleural effusion, not elsewhere classified: Secondary | ICD-10-CM

## 2014-07-08 NOTE — Telephone Encounter (Signed)
New message     FYI Want someone to know pt came in today for a chest xray.  He had fluid on his lungs and the xray tech changed it to stat so that the results would be sent to [place] sooner.

## 2014-07-09 ENCOUNTER — Other Ambulatory Visit: Payer: Medicare HMO

## 2014-07-09 ENCOUNTER — Encounter: Payer: Self-pay | Admitting: Physician Assistant

## 2014-07-09 DIAGNOSIS — R197 Diarrhea, unspecified: Secondary | ICD-10-CM

## 2014-07-10 ENCOUNTER — Encounter: Payer: Self-pay | Admitting: Adult Health

## 2014-07-10 ENCOUNTER — Telehealth: Payer: Self-pay | Admitting: *Deleted

## 2014-07-10 ENCOUNTER — Other Ambulatory Visit: Payer: Self-pay | Admitting: Family Medicine

## 2014-07-10 ENCOUNTER — Ambulatory Visit (INDEPENDENT_AMBULATORY_CARE_PROVIDER_SITE_OTHER): Payer: Commercial Managed Care - HMO | Admitting: Adult Health

## 2014-07-10 VITALS — BP 122/68 | HR 79 | Temp 98.1°F | Ht 69.0 in | Wt 184.8 lb

## 2014-07-10 DIAGNOSIS — J948 Other specified pleural conditions: Secondary | ICD-10-CM

## 2014-07-10 DIAGNOSIS — J90 Pleural effusion, not elsewhere classified: Secondary | ICD-10-CM

## 2014-07-10 LAB — CLOSTRIDIUM DIFFICILE BY PCR: Toxigenic C. Difficile by PCR: DETECTED — CR

## 2014-07-10 MED ORDER — METRONIDAZOLE 500 MG PO TABS: 500.0000 mg | ORAL_TABLET | Freq: Three times a day (TID) | ORAL | Status: DC

## 2014-07-10 MED ORDER — VANCOMYCIN HCL 125 MG PO CAPS: 125.0000 mg | ORAL_CAPSULE | Freq: Four times a day (QID) | ORAL | Status: DC

## 2014-07-10 NOTE — Progress Notes (Signed)
Quick Note:  Called spoke with patient who reports increased SOB and would like to be seen this afternoon if possible - appt scheduled with TP for 2:45p today ______

## 2014-07-10 NOTE — Progress Notes (Signed)
Reviewed and agree with assessment/plan. 

## 2014-07-10 NOTE — Assessment & Plan Note (Signed)
Recurrent Right pleural effusion  Previous exudative effusion ? parapneumonic , cytology was interesting for mesothelial cells  Will send pt for throacentesis w/ cxs and cytology   Have him return in 1 week w/ cxr s/p throa   Plan  We are setting you up for a thoracentesis to remove fluid in your lung (Pleural Effusion on the Right) Continue on current regimen  Follow up in office in 1 week after your thoracentesis with chest xray. And to review your lab results.  Follow up with Dr. Halford Chessman  As planned in 1 month and As needed

## 2014-07-10 NOTE — Progress Notes (Signed)
Subjective:    Patient ID: Anthony Wolfe, male    DOB: 11-Aug-1945, 68 y.o.   MRN: [id number]  HPI Anthony Wolfe is a 68 y.o. male with history of CML, CAD (s/p CABG 2010, recent NSTEMI 05/2014), ischemic cardiomyopathy, HTN, HLD, remote long tobacco abuse with emphysema, carotid disease and recently diagnosed VT s/p ICD, and recent pleural effusion s/p thoracentesis     06/23/14 [facility] f.u  He was admitted 10/9-10/16 with an episode of VT while hunting requiring amio/DCCV. Found to have a NSTEMI w/ elevated troponin  underwent cath showing occluded SVG to RCA, right to right collaterals and collaterals from septal and OM, EF 40% with inferior HK  2D Echo  showed EF 35-40% with severe inferior HK, grade 2 d/d, mod dilated LA, mildly dilated RA, mildly reduced RV function with normal size, mildly dilated RA. Medical therapy was recommended  ICD was placed.   CXR showed a right sided pleural effusion  And underwent   thoracentesis. Cytology showed reactive mesothelial cells with abnormal markers, negative culture. ("POSITIVE FOR CALRETININ, CYTOKERATIN 5/6, AND WT-1.") Repeat CXR minimal right pleural effusion. Right perihilar nodularity persists   In the interim since discharge he was diagnosed with C diff colitis on 06/12/14. Tx w/ Flagyl . Diarrhea has gotten better.  Reports that 2 days ago , got lightheaded after standing up and passed out walking striking his head on stove. He woke up with wife yelling at him. Says he did not have speech or vision changes. No neck or back pain. No extremity weakness.  Says his blood pressure has been running low and was held briefly due to dizziness when he was dx with C. Diff.  He denies chest pain, palpitiations , headache or ICD shock .   07/10/2014 Acute OV  Returns for recurrent right pleural effusion  Was seen at cardiology with chest xray showing increased right pleural effusion .  Has DOE , mainly with walking. Slightly increased over last  couple of weeks.  He was seen 2 weeks ago after hospitalization for  VT while hunting requiring amio/DCCV. Found to have a NSTEMI , CHF . ICD was placed.  Seen by pulmonary for moderate right pleural effusion . -exudative w/ suspected PNA (right sided aspdz on CT)  Thoracentesis was done with 1.5 L removed  Cytology showed Cytology showed reactive mesothelial cells with abnormal markers, negative culture. ("POSITIVE FOR CALRETININ, CYTOKERATIN 5/6, AND WT-1.") He is followed by oncology for CML on Sprycel.  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  Former smoker. No known asbestosis exposure.   Review of Systems Constitutional:   No  weight loss, night sweats,  Fevers, chills, +fatigue, or  lassitude.  HEENT:   No headaches,  Difficulty swallowing,  Tooth/dental problems, or  Sore throat,                No sneezing, itching, ear ache, nasal congestion, post nasal drip,   CV:  No chest pain,  Orthopnea, PND, swelling in lower extremities, anasarca, +dizziness,  syncope.   GI  No heartburn, indigestion, abdominal pain, nausea, vomiting, diarrhea, change in bowel habits, loss of appetite, bloody stools.   Resp:  No coughing up of blood.  No change in color of mucus.  No wheezing.  No chest wall deformity  Skin: no rash or lesions.  GU: no dysuria, change in color of urine, no urgency or frequency.  No flank pain, no hematuria   MS:  No joint pain  or swelling.  No decreased range of motion.  No back pain.  Psych:  No change in mood or affect. No depression or anxiety.  No memory loss.         Objective:   Physical Exam GEN: A/Ox3; pleasant , NAD, frail   HEENT:  [place]/AT,  EACs-clear, TMs-wnl, NOSE-clear, THROAT-clear, no lesions, no postnasal drip or exudate noted.   NECK:  Supple w/ fair ROM; no JVD; normal carotid impulses w/o bruits; no thyromegaly or nodules palpated; no lymphadenopathy.  RESP  Decreased BS in bases no accessory muscle use, no dullness to percussion  CARD:   RRR, no m/r/g  , no peripheral edema, pulses intact, no cyanosis or clubbing.  GI:   Soft & nt; nml bowel sounds; no organomegaly or masses detected. Rig Musco: Warm bil, no deformities or joint swelling noted.   Neuro: alert, no focal deficits noted.    Skin: Warm, no lesions or rashes,abrasion along forehead     cXR  12/1 /15  Progressive right lower lobe atelectasis and/or infiltrate with progressive right pleural effusion .    Assessment & Plan:

## 2014-07-10 NOTE — Telephone Encounter (Signed)
Tried to call again and no answer and no vm

## 2014-07-10 NOTE — Telephone Encounter (Signed)
Has CDiff again, start  Flagyl 500 tid for 14 days and vancomycin 125 mg poqid for 14 days with florastor 250 bid.

## 2014-07-10 NOTE — Patient Instructions (Signed)
We are setting you up for a thoracentesis to remove fluid in your lung (Pleural Effusion on the Right) Continue on current regimen  Follow up in office in 1 week after your thoracentesis with chest xray. And to review your lab results.  Follow up with Dr. Halford Chessman  As planned in 1 month and As needed

## 2014-07-10 NOTE — Telephone Encounter (Signed)
No answer or machine to lmom. I will try back later to go over carotid results

## 2014-07-10 NOTE — Telephone Encounter (Signed)
[place] on cell phone

## 2014-07-10 NOTE — Telephone Encounter (Signed)
Tried to call pt no answer and no vm.

## 2014-07-10 NOTE — Telephone Encounter (Signed)
Received call from [place] at [facility] lab with critical results, pt has postive C-Diff by PCR

## 2014-07-11 NOTE — Telephone Encounter (Signed)
Reached pt on cell # and has been notified of carotid results with verbal understanding to results given today. Pt aware will have repeat carotid in 2 yrs. Pt said thank you very much and Merry Christmas.

## 2014-07-14 NOTE — H&P (Signed)
PCP: Odette Fraction, MD Cardiologist: Radford Pax  Electrophysiologist: Caryl Comes  Anthony Wolfe is a 68 year old male patient of Dr. Radford Pax and Dr. Caryl Comes who has history of CAD status post CABG in 2010 and recent NSTEMI 05/2014 presenting with V. Tach requiring DC cardioversion amiodarone in the ambulance.Cardiac cath showed occluded SVG to the RCA with right to right collaterals and collaterals from the septal and OM. Medical therapy recommended. EF 40% cath 35-40% on echo.Patient also has had V. Tach status post ICD. Patient saw Hinton Dyer done, [place] 06/02/14 and10/30/15 for symptoms of dyspnea and 2 pound weight gain.patient's Lasix was increased. He has a moderate right pleural effusion that was slowly improving.  Patient comes in today complaining of worsening shortness of breath this morning. He is actually lost 5 pounds since last Friday. The Lasix is working well but he has no appetite and cannot lay flat. He has no edema. His O2 sats in the office are 96%. I discussed this patient in detail with Dr. Caryl Comes who recommends hospitalization and possible thoracentesis by pulmonary. We'll order a CT scan. He denies any chest pain, palpitations, dizziness or presyncope.  Allergies  Allergen Reactions  . Norvasc [Amlodipine Besylate] Rash    rash  . Sulfa Antibiotics Hives    Hives      Current Outpatient Prescriptions  Medication Sig Dispense Refill  . acetaminophen (TYLENOL) 325 MG tablet Take 1-2 tablets (325-650 mg total) by mouth every 4 (four) hours as needed for mild pain.    Marland Kitchen aspirin 81 MG chewable tablet Chew 1 tablet (81 mg total) by mouth daily.    Marland Kitchen atorvastatin (LIPITOR) 80 MG tablet Take 1 tablet (80 mg total) by mouth daily at 6 PM. 30 tablet 0  . clopidogrel (PLAVIX) 75 MG tablet Take 1 tablet (75 mg total) by mouth daily. 30 tablet 0  . dasatinib (SPRYCEL) 100 MG tablet Take 100 mg by mouth daily.    . furosemide (LASIX) 40  MG tablet Take 1 tablet (40 mg total) by mouth daily. 30 tablet 11  . guaiFENesin (MUCINEX) 600 MG 12 hr tablet Take 600 mg by mouth 2 (two) times daily as needed for cough or to loosen phlegm.    Marland Kitchen lisinopril (PRINIVIL,ZESTRIL) 5 MG tablet Take 1 tablet (5 mg total) by mouth daily. 30 tablet 0  . metoprolol (LOPRESSOR) 100 MG tablet Take 1 tablet (100 mg total) by mouth 2 (two) times daily. 60 tablet 6  . nitroGLYCERIN (NITROSTAT) 0.4 MG SL tablet Place 1 tablet (0.4 mg total) under the tongue every 5 (five) minutes as needed for chest pain. 25 tablet 4  . potassium chloride SA (K-DUR,KLOR-CON) 20 MEQ tablet Take 1 tablet (20 mEq total) by mouth daily. 30 tablet 11   No current facility-administered medications for this visit.    Past Medical History  Diagnosis Date  . Hyperlipidemia   . HTN (hypertension)   . Allergic rhinitis   . Prostate hypertrophy   . Anxiety   . CAD (coronary artery disease)     a. s/p 3v CABG 2010. b. NSTEMI 05/2014 in setting of VT. Cath impressions: "Recent IMI with occluded SVG to RCA. Has Right to Right collaterals and collaterals from septal and OM. EF 40%."  . TIA (transient ischemic attack)     ASPVD, S./P. right CEA, 2002, and known occluded left carotid  . 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 Ischemic cardiomyopathy     a. Prior EF 36%. b. 2014: 50%. c. 05/2014: EF 35-40% by  echo, 40% with inf HK by cath.  . Carotid disease, bilateral     a. Right CEA 2002; known occluded left carotid.  . Depression   . PVC's (premature ventricular contractions)     a. PVC's with prior Holter showing PVC load of 22%.  . 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 Pneumonia 05/23/2014  . Ventricular tachycardia     a. Admitted with VT 05/2013 - EPS with inducible sustained monomorphic VT; s/p Medtronic ICD 05/21/14.  . Emphysema of lung     Past Surgical  History  Procedure Laterality Date  . Coronary artery bypass graft  02/2009    3 vessel  . Colonoscopy  07/2010  . Carotidectomy  2003    right side  . Back surgery    . Electrophysiologic study  05/21/14  . Ep implantable device  05/21/14    single chamber Metronic ICD    Family History  Problem Relation Age of Onset  . Heart disease Mother   . Alcohol abuse Father     History   Social History  . Marital Status: Married    Spouse Name: N/A    Number of Children: 2  . Years of Education: N/A   Occupational History  .      retired Development worker, [employer]   Social History Main Topics  . Smoking status: Former Smoker    Quit date: 08/08/1997  . Smokeless tobacco: Former Systems developer  . Alcohol Use: No  . Drug Use: No  . Sexual Activity: Not on file   Other Topics Concern  . Not on file   Social History Narrative    ROS: See HPI Eyes: Negative Ears:Negative for hearing loss, tinnitus Cardiovascular: Negative for chest pain, palpitations,irregular heartbeat, dyspnea, dyspnea on exertion, near-syncope, orthopnea, paroxysmal nocturnal dyspnea and syncope,edema, claudication, cyanosis,.  Respiratory: Negative for cough, hemoptysis, sputum production and wheezing.  Endocrine: Negative for cold intolerance and heat intolerance.  Hematologic/Lymphatic: Negative for adenopathy and bleeding problem. Does not bruise/bleed easily.  Musculoskeletal: Negative.  Gastrointestinal: Negative for nausea, vomiting, reflux, abdominal pain, diarrhea, constipation.  Genitourinary: Negative for bladder incontinence, dysuria, flank pain, frequency, hematuria, hesitancy, nocturia and urgency.  Neurological: Negative.  Allergic/Immunologic: Negative for environmental allergies.   BP 116/72 mmHg  Pulse 60  Ht 5\' 9"  (1.753 m)  Wt 188 lb (85.276 kg)  BMI 27.75 kg/m2  SpO2 96%  PHYSICAL  EXAM: Well-nournished, in no acute distress. Neck: No JVD, HJR, Bruit, or thyroid enlargement  Lungs: decreased breath sounds at the right lung base half the way up clear on the left Cardiovascular: RRR, PMI not displaced, Normal S1 and S2, no murmurs, gallops, bruit, thrill, or heave.  Abdomen: BS normal. Soft without organomegaly, masses, lesions or tenderness.  Extremities: without cyanosis, clubbing or edema. Good distal pulses bilateral  SKin: Warm, no lesions or rashes   Musculoskeletal: No deformities  Neuro: no focal signs   Wt Readings from Last 3 Encounters:  06/06/14 193 lb 12.8 oz (87.907 kg)  06/02/14 194 lb 6.4 oz (88.179 kg)  05/22/14 192 lb 14.4 oz (87.499 kg)     Recent Labs    Lab Results  Component Value Date   WBC 7.0 06/02/2014   HGB 11.1* 06/02/2014   HCT 34.1* 06/02/2014   PLT 249.0 06/02/2014   GLUCOSE 91 06/02/2014   CHOL 117 08/12/2013   TRIG 114 08/12/2013   HDL 27* 08/12/2013   LDLCALC 67 08/12/2013   ALT <6 04/29/2014   AST 13 04/29/2014  NA 141 06/02/2014   K 3.7 06/02/2014   CL 109 06/02/2014   CREATININE 1.2 06/02/2014   BUN 11 06/02/2014   CO2 25 06/02/2014   TSH 2.03 06/02/2014   INR 1.39 05/19/2014   HGBA1C 5.5 08/12/2013      GNO:IBBCWU sinus rhythm, normal EKG Chest X-ray IMPRESSION: 1. Moderate right pleural effusion, rather increased from 05/22/2014, with right basilar airspace disease. 2. Previously seen airspace opacification in the right perihilar/suprahilar region has improved but has not completely resolved. 3. Followup to clearing of the above findings is recommended to exclude underlying malignancy.   Electronically Signed  By: Lorin Picket M.D.  On: 06/02/2014 15  2-D echo 05/17/14 Study Conclusions  - Left ventricle: The cavity size was normal. Wall thickness was normal. Systolic function was  moderately reduced. The estimated ejection fraction was in the range of 35% to 40%. Global mild hypokinesis with inferior severe hypokinesis. Features are consistent with a pseudonormal left ventricular filling pattern, with concomitant abnormal relaxation and increased filling pressure (grade 2 diastolic dysfunction). - Aortic valve: There was no stenosis. - Mitral valve: Mildly calcified annulus. Normal thickness leaflets . There was trivial regurgitation. - Left atrium: The atrium was moderately dilated. - Right ventricle: The cavity size was normal. Systolic function was mildly reduced. - Right atrium: The atrium was mildly dilated. - Pulmonary arteries: No complete TR doppler jet so unable to estimate PA systolic pressure. - Inferior vena cava: The vessel was normal in size. The respirophasic diameter changes were in the normal range (>= 50%), consistent with normal central venous pressure.  Impressions:  - Normal LV size and systolic function, EF 88-91%. Global mild hypokinesis with severe inferior hypokinesis. Normal RV size with mildly decreased systolic function. No significant valvular abnormalities.              CAD (coronary artery disease) of artery bypass graft, occluded VG-RCA - Imogene Burn, PA-C at 06/09/2014 12:25 PM     Status: Written Related Problem: CAD (coronary artery disease) of artery bypass graft, occluded VG-RCA   Expand All Collapse All   Patient had recent NSTEMI 05/2014 with cath showing occluded SVG to the RCA, right to right collaterals and collaterals septal and OM. EF 40% with inferior hypokinesis. 2-D echo on 05/17/14 EF 35-40% with severe inferior hypokinesis grade 2 diastolic dysfunction medical therapy was recommended for her CAD. He did have inducible V. Tach and Medtronic ICD was placed.Patient denies chest pain.            HTN (hypertension) - Imogene Burn, PA-C at 06/09/2014 12:25 PM      Status: Written Related Problem: HTN (hypertension)   Expand All Collapse All   BP stable            Pleural effusion, right - Imogene Burn, PA-C at 06/09/2014 12:26 PM     Status: Written Related Problem: Pleural effusion, right   Expand All Collapse All   Patient continues to have dyspnea at rest. He has a moderate size pleural effusion by chest x-ray. He has diuresed 5 pounds and still remains short of breath. Dr. Caryl Comes recommends admitting him to the hospital for CT scan and possible thoracentesis by pulmonary.            Cardiomyopathy, ischemic - Imogene Burn, PA-C at 06/09/2014 12:27 PM     Status: Written Related Problem: Cardiomyopathy, ischemic   Expand All Collapse All   Heart failure is compensated today.  Ventricular tachycardia - Imogene Burn, PA-C at 06/09/2014 12:28 PM     Status: Written Related Problem: Ventricular tachycardia   Expand All Collapse All   Recent ICD placement, left.

## 2014-07-14 NOTE — Telephone Encounter (Signed)
Pt aware and will get antibx and continue the [employer]

## 2014-07-15 ENCOUNTER — Telehealth: Payer: Self-pay | Admitting: Internal Medicine

## 2014-07-15 ENCOUNTER — Ambulatory Visit (HOSPITAL_COMMUNITY)
Admission: RE | Admit: 2014-07-15 | Discharge: 2014-07-15 | Disposition: A | Discharge status: Home / Self Care | Payer: Medicare HMO | Source: Ambulatory Visit | Attending: Radiology | Admitting: Radiology

## 2014-07-15 ENCOUNTER — Ambulatory Visit (HOSPITAL_COMMUNITY)
Admission: RE | Admit: 2014-07-15 | Discharge: 2014-07-15 | Disposition: A | Discharge status: Home / Self Care | Payer: Medicare HMO | Source: Ambulatory Visit | Attending: Adult Health | Admitting: Adult Health

## 2014-07-15 DIAGNOSIS — J90 Pleural effusion, not elsewhere classified: Secondary | ICD-10-CM

## 2014-07-15 DIAGNOSIS — J948 Other specified pleural conditions: Secondary | ICD-10-CM | POA: Insufficient documentation

## 2014-07-15 LAB — BODY FLUID CELL COUNT WITH DIFFERENTIAL
Eos, Fluid: 0 %
LYMPHS FL: 72 %
Monocyte-Macrophage-Serous Fluid: 27 % — ABNORMAL LOW (ref 50–90)
Neutrophil Count, Fluid: 1 % (ref 0–25)
Total Nucleated Cell Count, Fluid: 947 cu mm (ref 0–1000)

## 2014-07-15 LAB — PH, BODY FLUID: pH, Fluid: 8

## 2014-07-15 LAB — PROTEIN, BODY FLUID: Total protein, fluid: 4 g/dL

## 2014-07-15 LAB — GLUCOSE, SEROUS FLUID: Glucose, Fluid: 102 mg/dL

## 2014-07-15 LAB — LACTATE DEHYDROGENASE, PLEURAL OR PERITONEAL FLUID: LD FL: 101 U/L — AB (ref 3–23)

## 2014-07-15 LAB — AMYLASE, PLEURAL FLUID: AMYLASE, PLEURAL FLUID: 26 U/L

## 2014-07-15 NOTE — Telephone Encounter (Signed)
New message        Pt's wife calling stating that it has been over 10 days and pt still hasn't received Lisinopril in the mail and wants to know if Dr. Caryl Comes forgot to order it for the pt. Please call back and advise.

## 2014-07-15 NOTE — Procedures (Signed)
Successful US guided right thoracentesis. Yielded 1.5L of hazy, amber colored fluid. Pt did have a short vasovagal episode but felt fine within a few minutes. He was able to complete the procedure  Specimen was sent for labs. CXR ordered.  Ascencion Dike PA-C 07/15/2014 10:03 AM

## 2014-07-15 NOTE — Telephone Encounter (Signed)
Notified that I would call [facility] pharmacy to discuss and correct. She is thankful for the help.

## 2014-07-16 NOTE — Telephone Encounter (Signed)
Spoke with pharmacist, Heidi, confirmed decreased Lisinopril dosage at 2.5 mg. Notified patient that medication is being shipped to him. They are pleased and thankful for the help.

## 2014-07-17 ENCOUNTER — Encounter: Payer: Self-pay | Admitting: Nurse Practitioner

## 2014-07-17 ENCOUNTER — Encounter (HOSPITAL_COMMUNITY): Payer: Self-pay | Admitting: Cardiovascular Disease

## 2014-07-17 LAB — ADENOSINE DEAMINASE, FLUID: ADENOSINE DEAMINASE FL: 10.4 U/L — AB (ref <7.6)

## 2014-07-17 NOTE — Progress Notes (Signed)
Faxed [facility] prescription request back to [facility] with Dr. Calton Dach signature. Fax confirmation received.

## 2014-07-18 LAB — BODY FLUID CULTURE: Culture: NO GROWTH

## 2014-07-18 LAB — OTHER BODY FLUID CHEMISTRY: MISCELLANEOUS TEST RESULTS: 53

## 2014-07-21 ENCOUNTER — Ambulatory Visit (INDEPENDENT_AMBULATORY_CARE_PROVIDER_SITE_OTHER): Payer: Medicare HMO | Admitting: Family Medicine

## 2014-07-21 ENCOUNTER — Encounter: Payer: Self-pay | Admitting: Family Medicine

## 2014-07-21 VITALS — BP 110/74 | HR 84 | Temp 98.1°F | Resp 18 | Ht 69.0 in | Wt 175.0 lb

## 2014-07-21 DIAGNOSIS — R630 Anorexia: Secondary | ICD-10-CM

## 2014-07-21 DIAGNOSIS — R634 Abnormal weight loss: Secondary | ICD-10-CM

## 2014-07-21 MED ORDER — MIRTAZAPINE 30 MG PO TABS: 30.0000 mg | ORAL_TABLET | Freq: Every day | ORAL | Status: DC

## 2014-07-21 MED ORDER — MEGESTROL ACETATE 400 MG/10ML PO SUSP: 400.0000 mg | Freq: Every day | ORAL | Status: DC

## 2014-07-21 NOTE — Progress Notes (Signed)
Subjective:    Patient ID: Anthony Wolfe, male    DOB: August 28, 1945, 68 y.o.   MRN: [id number]  Dizziness   06/12/14 Patient was recently hospitalized after a heart attack and required an implantable cardiac defibrillator for ventricular tachycardia. Shortly thereafter he developed pneumonia and was started on antibiotics. After beginning antibiotics the patient has developed explosive diarrhea he is having 4 and 5 copious watery bowel movements a day. He reports nausea and crampy abdominal pain. All of this began after the antibiotics. He denies any fever. He does report nausea. He denies any vomiting. He recently had therapeutic thoracentesis of a pleural effusion.. On examination today he is not short of breath. His lungs are clear to auscultation bilaterally. However the patient's blood pressure is very low. He is dizzy. He appears dehydrated. He is currently taking Lasix 40 mg every day for congestive heart failure and is also on lisinopril.  At that time, my plan was: I am very concerned the patient may have C. Difficile colitis. I want him to start Flagyl 500 mg by mouth 3 times a day. I will do this for 14 days. I will check stool studies for C. Difficile 3 as well as GI pathogen panel. I will also start the patient on florastor 250 grams by mouth twice a day. I recommended that he temporarily discontinue lisinopril as he appears dehydrated to avoid potential acute renal insufficiency. I will recheck the patient on Tuesday or immediately if worse.  06/19/14 Patient is now doing much better since starting the Flagyl. He is having no further diarrhea. All 3 stool samples were positive for Clostridium difficile. Clinically he is much better. He is eating. He is drinking more. He continues to hold the lisinopril. His blood pressures now slightly elevated at 140/68. His heart doctor checked his creatinine on the ninth and found it elevated at 1.14. I would like to recheck this today.  At that time, my  plan was: Clinically the patient is resolved. I want him to finish the full 2 week therapy for C. Difficile colitis. Afterwards I would like him to continue the florastor for 1 month.  I will recheck a BMP today. If his creatinine is better I will resume the lisinopril next week..  07/21/14 Shortly after discontinuing the medication, the patient developed diarrhea again which was confirmed to be C. Difficile colitis. He is presently on 2 weeks of Flagyl 500 mg 3 times a day, and vancomycin 125 mg by mouth 4 times a day. The diarrhea has completely subsided. However the patient has lost 9 pounds since I last saw him. He states that he has lost 30 pounds overall since his heart attack. Since his heart attack he has no appetite. He denies any abdominal pain. He denies any dysphasia. He denies any blood in his stool. He denies any fevers and chills. He simply has no desire to eat. He does believe anxiety could be playing a large role in this. Since the loss began, he has suffered a heart attack, had a have a defibrillator placed in his chest, had undergo pleurocentesis 2 separate occasions with the third planned for next week. He's also been battling C. Difficile colitis. 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  Patient denies  Overt depression but he does report anhedonia, apathy, poor drive. He denies insomnia or suicidal ideation. He is adamant that he is having no abdominal pain or dysphagia or diarrhea or constipation. Past Medical History  Diagnosis Date  .  Hyperlipidemia   . HTN (hypertension)   . Allergic rhinitis   . Prostate hypertrophy   . Anxiety   . CAD (coronary artery disease)     a. s/p 3v CABG 2010. b. NSTEMI 05/2014 in setting of VT. Cath impressions: "Recent IMI with occluded SVG to RCA. Has Right to Right collaterals and collaterals from septal and OM. EF 40%."  . TIA (transient ischemic attack)     ASPVD, S./P. right CEA, 2002, and known occluded left carotid  . CML (chronic myelocytic  leukemia) 05/09/2012  . Ischemic cardiomyopathy     a. Prior EF 36%. b. 2014: 50%. c. 05/2014: EF 35-40% by echo, 40% with inf HK by cath.  . Carotid disease, bilateral     a. Right CEA 2002; known occluded left carotid. b. Duplex 06/2014: known occluded LICA, stable 3-50% RICA s/p CEA.  . Depression   . PVC's (premature ventricular contractions)     a. PVC's with prior Holter showing PVC load of 22%.  . 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 Pneumonia 05/23/2014  . Ventricular tachycardia     a. Admitted with VT 05/2013 - EPS with inducible sustained monomorphic VT; s/p Medtronic ICD 05/21/14.  . Emphysema of lung   . NSTEMI (non-ST elevation myocardial infarction) 05/2014  . Pleural effusion 05/2014  . Chronic systolic CHF (congestive heart failure)   . C. difficile colitis 06/16/2014   Past Surgical History  Procedure Laterality Date  . Coronary artery bypass graft  02/2009    3 vessel  . Colonoscopy  07/2010  . Carotidectomy  2003    right side  . Back surgery    . Electrophysiologic study  05/21/14  . Ep implantable device  05/21/14    single chamber Metronic ICD  . Left heart catheterization with coronary/graft angiogram N/A 05/19/2014    Procedure: LEFT HEART CATHETERIZATION WITH Beatrix Fetters;  Surgeon: Josue Hector, MD;  Location: [facility] CATH LAB;  Service: Cardiovascular;  Laterality: N/A;  . Electrophysiology study N/A 05/21/2014    Procedure: ELECTROPHYSIOLOGY STUDY;  Surgeon: Deboraha Sprang, MD;  Location: [facility] CATH LAB;  Service: Cardiovascular;  Laterality: N/A;   Current Outpatient Prescriptions on File Prior to Visit  Medication Sig Dispense Refill  . acetaminophen (TYLENOL) 325 MG tablet Take 1-2 tablets (325-650 mg total) by mouth every 4 (four) hours as needed for mild pain.    Marland Kitchen aspirin 81 MG chewable tablet Chew 1 tablet (81 mg total) by mouth daily.    Marland Kitchen atorvastatin (LIPITOR) 80 MG tablet Take 1 tablet (80 mg total) by mouth daily at 6 PM. 30  tablet 0  . carvedilol (COREG) 3.125 MG tablet Take 1 tablet (3.125 mg total) by mouth 2 (two) times daily. 180 tablet 3  . clopidogrel (PLAVIX) 75 MG tablet Take 1 tablet (75 mg total) by mouth daily. 30 tablet 0  . dasatinib (SPRYCEL) 100 MG tablet Take 100 mg by mouth daily.    . furosemide (LASIX) 20 MG tablet Take 1 tablet (20 mg total) by mouth daily. 30 tablet 9  . guaiFENesin (MUCINEX) 600 MG 12 hr tablet Take 600 mg by mouth 2 (two) times daily as needed for cough or to loosen phlegm.    Marland Kitchen lisinopril (PRINIVIL,ZESTRIL) 2.5 MG tablet Take 1 tablet (2.5 mg total) by mouth daily. 90 tablet 3  . metroNIDAZOLE (FLAGYL) 500 MG tablet Take 1 tablet (500 mg total) by mouth 3 (three) times daily. 42 tablet 0  . nitroGLYCERIN (NITROSTAT)  0.4 MG SL tablet Place 1 tablet (0.4 mg total) under the tongue every 5 (five) minutes as needed for chest pain. 25 tablet 4  . potassium chloride SA (K-DUR,KLOR-CON) 20 MEQ tablet Take 1 tablet (20 mEq total) by mouth daily. 30 tablet 11  . vancomycin (VANCOCIN) 125 MG capsule Take 1 capsule (125 mg total) by mouth 4 (four) times daily. 56 capsule 0   No current facility-administered medications on file prior to visit.   Allergies  Allergen Reactions  . Coreg [Carvedilol]     Changed to metoprolol 05/2014 due to dyspnea/COPD  . Norvasc [Amlodipine Besylate] Rash    rash  . Sulfa Antibiotics Hives    Hives    History   Social History  . Marital Status: Married    Spouse Name: N/A    Number of Children: 2  . Years of Education: N/A   Occupational History  .      retired Development worker, international aid   Social History Main Topics  . Smoking status: Former Smoker -- 3.00 packs/day for 45 years    Types: Cigarettes    Quit date: 08/08/1997  . Smokeless tobacco: Former Systems developer  . Alcohol Use: No  . Drug Use: No  . Sexual Activity: Not on file   Other Topics Concern  . Not on file   Social History Narrative     Review of Systems  Neurological: Positive for  dizziness.  All other systems reviewed and are negative.      Objective:   Physical Exam  Constitutional: He appears well-developed and well-nourished.  Cardiovascular: Normal rate, regular rhythm and normal heart sounds.   Pulmonary/Chest: Effort normal and breath sounds normal. No respiratory distress. He has no wheezes. He has no rales.  Abdominal: Soft. Bowel sounds are normal. He exhibits no distension. There is no tenderness. There is no rebound and no guarding.  Vitals reviewed.         Assessment & Plan:  Anorexia - Plan: megestrol (MEGACE) 400 MG/10ML suspension, mirtazapine (REMERON) 30 MG tablet  Loss of weight  tthere are many possible causes to the patient's anorexia and weight loss. Anxiety and depression certainly could be 1. I would like the patient to start Remeron 30 mg by mouth daily at bedtime as an appetite stimulant and is away to try to manage his depression. Patient's insurance will not cover Megace. Therefore that medication is discontinued. I would like to recheck the patient next week. He is seeing no improvement in his appetite at that time, I will begin by obtaining a CT scan of his abdomen to rule out obstruction. Patient is scheduled to see his oncologist next week for lab work to monitor his CML. Consider a gastric emptying study to evaluate for gastroparesis if no better.

## 2014-07-22 ENCOUNTER — Ambulatory Visit (INDEPENDENT_AMBULATORY_CARE_PROVIDER_SITE_OTHER): Payer: Commercial Managed Care - HMO | Admitting: Pulmonary Disease

## 2014-07-22 ENCOUNTER — Encounter: Payer: Self-pay | Admitting: Pulmonary Disease

## 2014-07-22 ENCOUNTER — Ambulatory Visit (INDEPENDENT_AMBULATORY_CARE_PROVIDER_SITE_OTHER)
Admission: RE | Admit: 2014-07-22 | Discharge: 2014-07-22 | Disposition: A | Discharge status: Home / Self Care | Payer: Commercial Managed Care - HMO | Source: Ambulatory Visit | Attending: Pulmonary Disease | Admitting: Pulmonary Disease

## 2014-07-22 ENCOUNTER — Ambulatory Visit: Payer: Commercial Managed Care - HMO | Admitting: Adult Health

## 2014-07-22 VITALS — BP 102/62 | HR 78 | Ht 69.0 in | Wt 175.0 lb

## 2014-07-22 DIAGNOSIS — Z9889 Other specified postprocedural states: Secondary | ICD-10-CM

## 2014-07-22 DIAGNOSIS — J948 Other specified pleural conditions: Secondary | ICD-10-CM

## 2014-07-22 DIAGNOSIS — J90 Pleural effusion, not elsewhere classified: Secondary | ICD-10-CM

## 2014-07-22 NOTE — Progress Notes (Signed)
Chief Complaint  Patient presents with  . Follow-up    Pt reports much improvement in breathing since last OV. Denies CP/tightness and SOB. CXR today.     History of Present Illness: Anthony Wolfe is a 68 y.o. male former smoker with persistent Rt pleural effusion.  He had repeat thoracentesis last week.  He has been feeling better.  His breathing is better, and he no longer has Rt chest pain.  He denies fever, cough, sputum, hemoptysis, or leg swelling.  TESTS: 05/17/14 Echo >> EF 35 to 16%, grade 2 diastolic dysfx 10/96/04 Rt thoracentesis >> glucose 100, protein 3.7, LDH 108, WBC 517 (90% lymphs), cytology >> reactive mesothelial cells positive for calretinin, cytokeratin 5/6, WT-1 06/10/14 CT chest >> Rt effusion, ASD RLL, emphysema, 3 mm nodule Lt lung 07/15/14 Rt thoracentesis >> 1.5 liters, LDH 101, WBC 947 (72L, 64M), ADA 10.4, cytology >> reactive mesothelial cells  Past medical hx >> HTN, HLD, CAD, ischemic cardiomyopathy, BPH, Anxiety, Depression, CML  Past surgical hx, Medications, Allergies, Family hx, Social hx all reviewed.   Physical Exam:  General - No distress ENT - No sinus tenderness, no oral exudate, no LAN Cardiac - s1s2 regular, no murmur Chest - No wheeze/rales/dullness Back - No focal tenderness Abd - Soft, non-tender Ext - No edema Neuro - Normal strength Skin - No rashes Psych - normal mood, and behavior  Dg Chest 2 View  07/22/2014   CLINICAL DATA:  Status post right thoracentesis common difficulty breathing  EXAM: CHEST  2 VIEW  COMPARISON:  07/15/2014  FINDINGS: Cardiac shadow is stable. Postoperative changes are again seen. A defibrillator is again noted in satisfactory position. A small residual right-sided pleural effusion is seen stable from the prior exam. No significant increase is noted. No pneumothorax is noted.  IMPRESSION: Mild residual right pleural effusion stable from the previous exam.   Electronically Signed   By: Inez Catalina M.D.    On: 07/22/2014 09:18    Assessment/Plan:  Recurrent Rt pleural effusion >> most likely parapneumonia.  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 Plan: - f/u pleural fluid cx's from 07/15/14 - f/u CXR in 4 weeks - if fluid re-accumulates, then he would need evaluation by thoracic surgery  Chesley Mires, MD [place] Pager:  [phone]

## 2014-07-22 NOTE — Patient Instructions (Signed)
Follow up in 4 weeks with Dr. Halford Chessman or Tammy Parrett >> will need chest xray on day of visit

## 2014-07-25 ENCOUNTER — Other Ambulatory Visit (HOSPITAL_BASED_OUTPATIENT_CLINIC_OR_DEPARTMENT_OTHER): Payer: Commercial Managed Care - HMO

## 2014-07-25 DIAGNOSIS — C9210 Chronic myeloid leukemia, BCR/ABL-positive, not having achieved remission: Secondary | ICD-10-CM

## 2014-07-25 DIAGNOSIS — C9290 Myeloid leukemia, unspecified, not having achieved remission: Secondary | ICD-10-CM

## 2014-07-25 LAB — COMPREHENSIVE METABOLIC PANEL (CC13)
ALT: 16 U/L (ref 0–55)
AST: 19 U/L (ref 5–34)
Albumin: 3.3 g/dL — ABNORMAL LOW (ref 3.5–5.0)
Alkaline Phosphatase: 69 U/L (ref 40–150)
Anion Gap: 10 mEq/L (ref 3–11)
BUN: 8.2 mg/dL (ref 7.0–26.0)
CALCIUM: 8.7 mg/dL (ref 8.4–10.4)
CHLORIDE: 109 meq/L (ref 98–109)
CO2: 24 meq/L (ref 22–29)
CREATININE: 0.9 mg/dL (ref 0.7–1.3)
EGFR: 87 mL/min/{1.73_m2} — ABNORMAL LOW (ref >90)
Glucose: 112 mg/dl (ref 70–140)
Potassium: 4 mEq/L (ref 3.5–5.1)
SODIUM: 142 meq/L (ref 136–145)
Total Bilirubin: 0.36 mg/dL (ref 0.20–1.20)
Total Protein: 6.1 g/dL — ABNORMAL LOW (ref 6.4–8.3)

## 2014-07-25 LAB — CBC WITH DIFFERENTIAL/PLATELET
BASO%: 1 % (ref 0.0–2.0)
BASOS ABS: 0 10*3/uL (ref 0.0–0.1)
EOS ABS: 0.1 10*3/uL (ref 0.0–0.5)
EOS%: 3.4 % (ref 0.0–7.0)
HCT: 37 % — ABNORMAL LOW (ref 38.4–49.9)
HEMOGLOBIN: 11.9 g/dL — AB (ref 13.0–17.1)
LYMPH%: 23.6 % (ref 14.0–49.0)
MCH: 31.8 pg (ref 27.2–33.4)
MCHC: 32.3 g/dL (ref 32.0–36.0)
MCV: 98.5 fL — ABNORMAL HIGH (ref 79.3–98.0)
MONO#: 0.5 10*3/uL (ref 0.1–0.9)
MONO%: 11.3 % (ref 0.0–14.0)
NEUT%: 60.7 % (ref 39.0–75.0)
NEUTROS ABS: 2.6 10*3/uL (ref 1.5–6.5)
PLATELETS: 185 10*3/uL (ref 140–400)
RBC: 3.75 10*6/uL — AB (ref 4.20–5.82)
RDW: 16.5 % — ABNORMAL HIGH (ref 11.0–14.6)
WBC: 4.2 10*3/uL (ref 4.0–10.3)
lymph#: 1 10*3/uL (ref 0.9–3.3)

## 2014-07-25 LAB — LACTATE DEHYDROGENASE (CC13): LDH: 137 U/L (ref 125–245)

## 2014-07-28 ENCOUNTER — Ambulatory Visit (INDEPENDENT_AMBULATORY_CARE_PROVIDER_SITE_OTHER): Payer: Medicare HMO | Admitting: Family Medicine

## 2014-07-28 ENCOUNTER — Encounter: Payer: Self-pay | Admitting: Family Medicine

## 2014-07-28 ENCOUNTER — Ambulatory Visit
Admission: RE | Admit: 2014-07-28 | Discharge: 2014-07-28 | Disposition: A | Discharge status: Home / Self Care | Payer: Commercial Managed Care - HMO | Source: Ambulatory Visit | Attending: Family Medicine | Admitting: Family Medicine

## 2014-07-28 VITALS — BP 128/68 | HR 78 | Temp 98.4°F | Resp 16 | Ht 69.0 in | Wt 178.0 lb

## 2014-07-28 DIAGNOSIS — J90 Pleural effusion, not elsewhere classified: Secondary | ICD-10-CM

## 2014-07-28 DIAGNOSIS — R630 Anorexia: Secondary | ICD-10-CM

## 2014-07-28 NOTE — Progress Notes (Signed)
Subjective:    Patient ID: Anthony Wolfe, male    DOB: [birth year], 68 y.o.   MRN: [id number]  Dizziness   06/12/14 Patient was recently hospitalized after a heart attack and required an implantable cardiac defibrillator for ventricular tachycardia. Shortly thereafter he developed pneumonia and was started on antibiotics. After beginning antibiotics the patient has developed explosive diarrhea he is having 4 and 5 copious watery bowel movements a day. He reports nausea and crampy abdominal pain. All of this began after the antibiotics. He denies any fever. He does report nausea. He denies any vomiting. He recently had therapeutic thoracentesis of a pleural effusion.. On examination today he is not short of breath. His lungs are clear to auscultation bilaterally. However the patient's blood pressure is very low. He is dizzy. He appears dehydrated. He is currently taking Lasix 40 mg every day for congestive heart failure and is also on lisinopril.  At that time, my plan was: I am very concerned the patient may have C. Difficile colitis. I want him to start Flagyl 500 mg by mouth 3 times a day. I will do this for 14 days. I will check stool studies for C. Difficile 3 as well as GI pathogen panel. I will also start the patient on florastor 250 grams by mouth twice a day. I recommended that he temporarily discontinue lisinopril as he appears dehydrated to avoid potential acute renal insufficiency. I will recheck the patient on Tuesday or immediately if worse.  06/19/14 Patient is now doing much better since starting the Flagyl. He is having no further diarrhea. All 3 stool samples were positive for Clostridium difficile. Clinically he is much better. He is eating. He is drinking more. He continues to hold the lisinopril. His blood pressures now slightly elevated at 140/68. His heart doctor checked his creatinine on the ninth and found it elevated at 1.14. I would like to recheck this today.  At that time, my  plan was: Clinically the patient is resolved. I want him to finish the full 2 week therapy for C. Difficile colitis. Afterwards I would like him to continue the florastor for 1 month.  I will recheck a BMP today. If his creatinine is better I will resume the lisinopril next week..  07/21/14 Shortly after discontinuing the medication, the patient developed diarrhea again which was confirmed to be C. Difficile colitis. He is presently on 2 weeks of Flagyl 500 mg 3 times a day, and vancomycin 125 mg by mouth 4 times a day. The diarrhea has completely subsided. However the patient has lost 9 pounds since I last saw him. He states that he has lost 30 pounds overall since his heart attack. Since his heart attack he has no appetite. He denies any abdominal pain. He denies any dysphasia. He denies any blood in his stool. He denies any fevers and chills. He simply has no desire to eat. He does believe anxiety could be playing a large role in this. Since the loss began, he has suffered a heart attack, had a have a defibrillator placed in his chest, had undergo pleurocentesis 2 separate occasions with the third planned for next week. He's also been battling C. Difficile colitis. 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  Patient denies  Overt depression but he does report anhedonia, apathy, poor drive. He denies insomnia or suicidal ideation. He is adamant that he is having no abdominal pain or dysphagia or diarrhea or constipation.  At that time, my plan was:  tthere are many possible causes to the patient's anorexia and weight loss. Anxiety and depression certainly could be 1. I would like the patient to start Remeron 30 mg by mouth daily at bedtime as an appetite stimulant and is away to try to manage his depression. Patient's insurance will not cover Megace. Therefore that medication is discontinued. I would like to recheck the patient next week. He is seeing no improvement in his appetite at that time, I will begin by  obtaining a CT scan of his abdomen to rule out obstruction. Patient is scheduled to see his oncologist next week for lab work to monitor his CML. Consider a gastric emptying study to evaluate for gastroparesis if no better.  07/28/14 Patient's appetite has improved on Remeron. He has gained 3 pounds in the last week. His diarrhea continues to be completely resolved. He finishes his vancomycin and Flagyl today. Past Medical History  Diagnosis Date  . Hyperlipidemia   . HTN (hypertension)   . Allergic rhinitis   . Prostate hypertrophy   . Anxiety   . CAD (coronary artery disease)     a. s/p 3v CABG 2010. b. NSTEMI 05/2014 in setting of VT. Cath impressions: "Recent IMI with occluded SVG to RCA. Has Right to Right collaterals and collaterals from septal and OM. EF 40%."  . TIA (transient ischemic attack)     ASPVD, S./P. right CEA, 2002, and known occluded left carotid  . 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 Ischemic cardiomyopathy     a. Prior EF 36%. b. 2014: 50%. c. 05/2014: EF 35-40% by echo, 40% with inf HK by cath.  . Carotid disease, bilateral     a. Right CEA 2002; known occluded left carotid. b. Duplex 06/2014: known occluded LICA, stable 0-09% RICA s/p CEA.  . Depression   . PVC's (premature ventricular contractions)     a. PVC's with prior Holter showing PVC load of 22%.  . 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 Pneumonia 05/23/2014  . Ventricular tachycardia     a. Admitted with VT 05/2013 - EPS with inducible sustained monomorphic VT; s/p Medtronic ICD 05/21/14.  . Emphysema of lung   . NSTEMI (non-ST elevation myocardial infarction) 05/2014  . Pleural effusion 05/2014  . Chronic systolic CHF (congestive heart failure)   . C. difficile colitis 06/16/2014   Past Surgical History  Procedure Laterality Date  . Coronary artery bypass graft  02/2009    3 vessel  . Colonoscopy  07/2010  . Carotidectomy  2003    right side  . Back surgery    . Electrophysiologic  study  05/21/14  . Ep implantable device  05/21/14    single chamber Metronic ICD  . Left heart catheterization with coronary/graft angiogram N/A 05/19/2014    Procedure: LEFT HEART CATHETERIZATION WITH Beatrix Fetters;  Surgeon: Josue Hector, MD;  Location: [facility] CATH LAB;  Service: Cardiovascular;  Laterality: N/A;  . Electrophysiology study N/A 05/21/2014    Procedure: ELECTROPHYSIOLOGY STUDY;  Surgeon: Deboraha Sprang, MD;  Location: [facility] CATH LAB;  Service: Cardiovascular;  Laterality: N/A;   Current Outpatient Prescriptions on File Prior to Visit  Medication Sig Dispense Refill  . acetaminophen (TYLENOL) 325 MG tablet Take 1-2 tablets (325-650 mg total) by mouth every 4 (four) hours as needed for mild pain.    Marland Kitchen aspirin 81 MG chewable tablet Chew 1 tablet (81 mg total) by mouth daily.    Marland Kitchen atorvastatin (LIPITOR) 80 MG tablet Take 1 tablet (  80 mg total) by mouth daily at 6 PM. 30 tablet 0  . carvedilol (COREG) 3.125 MG tablet Take 1 tablet (3.125 mg total) by mouth 2 (two) times daily. 180 tablet 3  . clopidogrel (PLAVIX) 75 MG tablet Take 1 tablet (75 mg total) by mouth daily. 30 tablet 0  . dasatinib (SPRYCEL) 100 MG tablet Take 100 mg by mouth daily.    . furosemide (LASIX) 20 MG tablet Take 1 tablet (20 mg total) by mouth daily. 30 tablet 9  . guaiFENesin (MUCINEX) 600 MG 12 hr tablet Take 600 mg by mouth 2 (two) times daily as needed for cough or to loosen phlegm.    Marland Kitchen lisinopril (PRINIVIL,ZESTRIL) 2.5 MG tablet Take 1 tablet (2.5 mg total) by mouth daily. 90 tablet 3  . megestrol (MEGACE) 400 MG/10ML suspension Take 10 mLs (400 mg total) by mouth daily. 240 mL 0  . metroNIDAZOLE (FLAGYL) 500 MG tablet Take 1 tablet (500 mg total) by mouth 3 (three) times daily. 42 tablet 0  . mirtazapine (REMERON) 30 MG tablet Take 1 tablet (30 mg total) by mouth at bedtime. 30 tablet 5  . nitroGLYCERIN (NITROSTAT) 0.4 MG SL tablet Place 1 tablet (0.4 mg total) under the tongue every 5 (five)  minutes as needed for chest pain. 25 tablet 4  . potassium chloride SA (K-DUR,KLOR-CON) 20 MEQ tablet Take 1 tablet (20 mEq total) by mouth daily. 30 tablet 11  . vancomycin (VANCOCIN) 125 MG capsule Take 1 capsule (125 mg total) by mouth 4 (four) times daily. 56 capsule 0   No current facility-administered medications on file prior to visit.   Allergies  Allergen Reactions  . Norvasc [Amlodipine Besylate] Rash    rash  . Sulfa Antibiotics Hives    Hives    History   Social History  . Marital Status: Married    Spouse Name: N/A    Number of Children: 2  . Years of Education: N/A   Occupational History  .      retired Development worker, international aid   Social History Main Topics  . Smoking status: Former Smoker -- 3.00 packs/day for 45 years    Types: Cigarettes    Quit date: 08/08/1997  . Smokeless tobacco: Former Systems developer  . Alcohol Use: No  . Drug Use: No  . Sexual Activity: Not on file   Other Topics Concern  . Not on file   Social History Narrative     Review of Systems  Neurological: Positive for dizziness.  All other systems reviewed and are negative.      Objective:   Physical Exam  Constitutional: He appears well-developed and well-nourished.  Cardiovascular: Normal rate, regular rhythm and normal heart sounds.   Pulmonary/Chest: Effort normal and breath sounds normal. No respiratory distress. He has no wheezes. He has no rales.  Abdominal: Soft. Bowel sounds are normal. He exhibits no distension. There is no tenderness. There is no rebound and no guarding.  Vitals reviewed.         Assessment & Plan:  Recurrent right pleural effusion - Plan: DG Chest 2 View  Anorexia  complete the antibiotics. I would like to continue the florastor for 2-3 months.  Return ASA if diarrhea returns.  Continue Remeron and recheck the patient's weight in January. At the patient's weight begin as to decline, I would proceed with a gastric emptying study to look for gastroparesis as well as a  CT scan to evaluate for blockage/obstruction. I reviewed the patient's lab work from  December 18 which was essentially normal aside from low albumin suggesting malnutrition. On today's examination, the breath sounds in the right base are diminished. I will repeat a chest x-ray to ensure that his right pleural effusion has not returned. It has returned he may need sooner follow-up with pulmonary to discuss pleurodesis.

## 2014-07-29 ENCOUNTER — Telehealth: Payer: Self-pay | Admitting: Family Medicine

## 2014-07-29 NOTE — Telephone Encounter (Signed)
[phone] Pt is calling about results from chest xray

## 2014-07-30 NOTE — Telephone Encounter (Signed)
Tried to call no answer and no vm 

## 2014-07-30 NOTE — Telephone Encounter (Signed)
Patients wife aware of results.

## 2014-08-05 ENCOUNTER — Ambulatory Visit (HOSPITAL_BASED_OUTPATIENT_CLINIC_OR_DEPARTMENT_OTHER): Payer: Commercial Managed Care - HMO | Admitting: Hematology and Oncology

## 2014-08-05 ENCOUNTER — Telehealth: Payer: Self-pay | Admitting: Hematology and Oncology

## 2014-08-05 ENCOUNTER — Encounter: Payer: Self-pay | Admitting: Hematology and Oncology

## 2014-08-05 VITALS — BP 147/66 | HR 93 | Temp 98.4°F | Resp 18 | Ht 69.0 in | Wt 180.1 lb

## 2014-08-05 DIAGNOSIS — C9210 Chronic myeloid leukemia, BCR/ABL-positive, not having achieved remission: Secondary | ICD-10-CM

## 2014-08-05 DIAGNOSIS — D630 Anemia in neoplastic disease: Secondary | ICD-10-CM

## 2014-08-05 DIAGNOSIS — C9290 Myeloid leukemia, unspecified, not having achieved remission: Secondary | ICD-10-CM

## 2014-08-05 DIAGNOSIS — I255 Ischemic cardiomyopathy: Secondary | ICD-10-CM

## 2014-08-05 DIAGNOSIS — J90 Pleural effusion, not elsewhere classified: Secondary | ICD-10-CM

## 2014-08-05 NOTE — Telephone Encounter (Signed)
gv and printed appt sched and avs forpt for March 2016 °

## 2014-08-06 NOTE — Assessment & Plan Note (Signed)
He is on various cardiac medication and have placement of pacemaker and defibrillator. Clinically, he appears to be euvolemic.

## 2014-08-06 NOTE — Assessment & Plan Note (Signed)
Clinically, he has persistent effusion on the right lung base I reviewed the package insert for Dasatinib and noticed that it can cause pleural effusion. I consulted with his pulmonologist briefly. Overall, I felt that the pleural effusion was likely related to recent pneumonia as the left lung base is clear and this is not compatible with pleural effusion associated with dasatinib, which in general should be bilaterally nature. Nevertheless, I will monitor his pulmonary function carefully while he is on treatment.

## 2014-08-06 NOTE — Assessment & Plan Note (Signed)
This is likely due to recent treatment. The patient denies recent history of bleeding such as epistaxis, hematuria or hematochezia. He is asymptomatic from the anemia. I will observe for now.  He does not require transfusion now. I will continue the chemotherapy at current dose without dosage adjustment.  If the anemia gets progressive worse in the future, I might have to delay his treatment or adjust the chemotherapy dose.  

## 2014-08-06 NOTE — Progress Notes (Signed)
[facility] OFFICE PROGRESS NOTE  Patient Care Team: [name], MD as PCP - General (Family Medicine) [name], MD as Attending Physician (Cardiology) [name], MD (Family Medicine) [name], MD (Cardiothoracic Surgery) [name], MD as Attending Physician (Vascular Surgery) [name], MD as Consulting Physician (Hematology and Oncology)  SUMMARY OF ONCOLOGIC HISTORY:   Chronic myelogenous leukemia   05/04/2012 Bone Marrow Biopsy BM confirmed diagnosis of CML in Chronic phase. BCR/ABL by PCR detected abnormalitis with b2a2 & b3a2 subtypes   05/09/2012 - 06/06/2013 Chemotherapy He was started on treatment with Dasatinib 100 mg daily   06/06/2012 Adverse Reaction Dose of medication was reduced to 50 mg daily due to pancytopenia   01/24/2013 Progression Patient was noted to have elevated blood count which and was subsequently found to be noncompliant to treatment. The patient has not been on treatment for several months because his prescription ran out. He was restarted back on treatment   01/16/2014 Progression Bcr/ABL by PCR is worse. Dose of Dasatinib was increased to 100 mg.   02/26/2014 Tumor Marker Blood work for ABL kinase mutation was negative.    INTERVAL HISTORY: Please see below for problem oriented charting. The patient was recently admitted to the hospital. He had pneumonia and subsequently developed C. difficile colitis. Subsequently, the patient was found to have a myocardial infarction. He developed arrhythmia and subsequently have placement of pacemaker and defibrillator. He was also found to have pleural effusion on the right lung base thought to be related to pneumonia have repeat thoracentesis.  REVIEW OF SYSTEMS:   Constitutional: Denies fevers, chills or abnormal weight loss Eyes: Denies blurriness of vision Ears, nose, mouth, throat, and face: Denies mucositis or sore throat Respiratory: Denies cough, dyspnea or  wheezes Cardiovascular: Denies palpitation, chest discomfort or lower extremity swelling Gastrointestinal:  Denies nausea, heartburn or change in bowel habits Skin: Denies abnormal skin rashes Lymphatics: Denies new lymphadenopathy or easy bruising Neurological:Denies numbness, tingling or new weaknesses Behavioral/Psych: Mood is stable, no new changes  All other systems were reviewed with the patient and are negative.  I have reviewed the past medical history, past surgical history, social history and family history with the patient and they are unchanged from previous note.  ALLERGIES:  is allergic to norvasc and sulfa antibiotics.  MEDICATIONS:  Current Outpatient Prescriptions  Medication Sig Dispense Refill  . acetaminophen (TYLENOL) 325 MG tablet Take 1-2 tablets (325-650 mg total) by mouth every 4 (four) hours as needed for mild pain.    Marland Kitchen aspirin 81 MG chewable tablet Chew 1 tablet (81 mg total) by mouth daily.    Marland Kitchen atorvastatin (LIPITOR) 80 MG tablet Take 1 tablet (80 mg total) by mouth daily at 6 PM. 30 tablet 0  . carvedilol (COREG) 3.125 MG tablet Take 1 tablet (3.125 mg total) by mouth 2 (two) times daily. 180 tablet 3  . clopidogrel (PLAVIX) 75 MG tablet Take 1 tablet (75 mg total) by mouth daily. 30 tablet 0  . dasatinib (SPRYCEL) 100 MG tablet Take 100 mg by mouth daily.    . furosemide (LASIX) 20 MG tablet Take 1 tablet (20 mg total) by mouth daily. 30 tablet 9  . guaiFENesin (MUCINEX) 600 MG 12 hr tablet Take 600 mg by mouth 2 (two) times daily as needed for cough or to loosen phlegm.    Marland Kitchen lisinopril (PRINIVIL,ZESTRIL) 2.5 MG tablet Take 1 tablet (2.5 mg total) by mouth daily. 90 tablet 3  .  mirtazapine (REMERON) 30 MG tablet Take 1 tablet (30 mg total) by mouth at bedtime. 30 tablet 5  . nitroGLYCERIN (NITROSTAT) 0.4 MG SL tablet Place 1 tablet (0.4 mg total) under the tongue every 5 (five) minutes as needed for chest pain. 25 tablet 4  . potassium chloride SA  (K-DUR,KLOR-CON) 20 MEQ tablet Take 1 tablet (20 mEq total) by mouth daily. 30 tablet 11   No current facility-administered medications for this visit.    PHYSICAL EXAMINATION: ECOG PERFORMANCE STATUS: 0 - Asymptomatic  Filed Vitals:   08/05/14 0906  BP: 147/66  Pulse: 93  Temp: 98.4 F (36.9 C)  Resp: 18   Filed Weights   08/05/14 0906  Weight: 180 lb 1.6 oz (81.693 kg)    GENERAL:alert, no distress and comfortable SKIN: skin color, texture, turgor are normal, no rashes or significant lesions EYES: normal, Conjunctiva are pink and non-injected, sclera clear OROPHARYNX:no exudate, no erythema and lips, buccal mucosa, and tongue normal  NECK: supple, thyroid normal size, non-tender, without nodularity LYMPH:  no palpable lymphadenopathy in the cervical, axillary or inguinal LUNGS: He has reduced breath sounds on the right lung base with dullness on percussion, compatible with persistent right pleural effusion HEART: regular rate & rhythm and no murmurs and no lower extremity edema. Pacemaker in situ ABDOMEN:abdomen soft, non-tender and normal bowel sounds Musculoskeletal:no cyanosis of digits and no clubbing  NEURO: alert & oriented x 3 with fluent speech, no focal motor/sensory deficits  LABORATORY DATA:  I have reviewed the data as listed    Component Value Date/Time   NA 142 07/25/2014 0814   NA 143 06/23/2014 1518   K 4.0 07/25/2014 0814   K 4.2 06/23/2014 1518   CL 109 06/23/2014 1518   CL 110* 11/14/2012 0918   CO2 24 07/25/2014 0814   CO2 27 06/23/2014 1518   GLUCOSE 112 07/25/2014 0814   GLUCOSE 109* 06/23/2014 1518   GLUCOSE 108* 11/14/2012 0918   BUN 8.2 07/25/2014 0814   BUN 13 06/23/2014 1518   CREATININE 0.9 07/25/2014 0814   CREATININE 1.3 06/23/2014 1518   CREATININE 1.16 06/19/2014 1610   CALCIUM 8.7 07/25/2014 0814   CALCIUM 8.7 06/23/2014 1518   PROT 6.1* 07/25/2014 0814   PROT 6.6 06/09/2014 1400   ALBUMIN 3.3* 07/25/2014 0814   ALBUMIN 3.3*  06/09/2014 1400   AST 19 07/25/2014 0814   AST 17 06/09/2014 1400   ALT 16 07/25/2014 0814   ALT 11 06/09/2014 1400   ALKPHOS 69 07/25/2014 0814   ALKPHOS 86 06/09/2014 1400   BILITOT 0.36 07/25/2014 0814   BILITOT 0.6 06/09/2014 1400   GFRNONAA 63 06/19/2014 1610   GFRNONAA 66* 06/10/2014 0545   GFRAA 73 06/19/2014 1610   GFRAA 77* 06/10/2014 0545    No results found for: SPEP, UPEP  Lab Results  Component Value Date   WBC 4.2 07/25/2014   NEUTROABS 2.6 07/25/2014   HGB 11.9* 07/25/2014   HCT 37.0* 07/25/2014   MCV 98.5* 07/25/2014   PLT 185 07/25/2014      Chemistry      Component Value Date/Time   NA 142 07/25/2014 0814   NA 143 06/23/2014 1518   K 4.0 07/25/2014 0814   K 4.2 06/23/2014 1518   CL 109 06/23/2014 1518   CL 110* 11/14/2012 0918   CO2 24 07/25/2014 0814   CO2 27 06/23/2014 1518   BUN 8.2 07/25/2014 0814   BUN 13 06/23/2014 1518   CREATININE 0.9 07/25/2014 7622  CREATININE 1.3 06/23/2014 1518   CREATININE 1.16 06/19/2014 1610      Component Value Date/Time   CALCIUM 8.7 07/25/2014 0814   CALCIUM 8.7 06/23/2014 1518   ALKPHOS 69 07/25/2014 0814   ALKPHOS 86 06/09/2014 1400   AST 19 07/25/2014 0814   AST 17 06/09/2014 1400   ALT 16 07/25/2014 0814   ALT 11 06/09/2014 1400   BILITOT 0.36 07/25/2014 0814   BILITOT 0.6 06/09/2014 1400       RADIOGRAPHIC STUDIES: I reviewed his most recent checks x-ray I have personally reviewed the radiological images as listed and agreed with the findings in the report.  ASSESSMENT & PLAN:  Chronic myelogenous leukemia Thankfully, the patient has no side effects with increased dose of chemotherapy. Repeat blood work showed he has no mutation and the BCR/ABL by PCR technique is improving. I will see him back in 3 months with history, physical examination, and blood work ahead of time.  Anemia in neoplastic disease This is likely due to recent treatment. The patient denies recent history of bleeding such  as epistaxis, hematuria or hematochezia. He is asymptomatic from the anemia. I will observe for now.  He does not require transfusion now. I will continue the chemotherapy at current dose without dosage adjustment.  If the anemia gets progressive worse in the future, I might have to delay his treatment or adjust the chemotherapy dose.  Cardiomyopathy, ischemic He is on various cardiac medication and have placement of pacemaker and defibrillator. Clinically, he appears to be euvolemic.  Pleural effusion, right Clinically, he has persistent effusion on the right lung base I reviewed the package insert for Dasatinib and noticed that it can cause pleural effusion. I consulted with his pulmonologist briefly. Overall, I felt that the pleural effusion was likely related to recent pneumonia as the left lung base is clear and this is not compatible with pleural effusion associated with dasatinib, which in general should be bilaterally nature. Nevertheless, I will monitor his pulmonary function carefully while he is on treatment.   No orders of the defined types were placed in this encounter.   All questions were answered. The patient knows to call the clinic with any problems, questions or concerns. No barriers to learning was detected. I spent 30 minutes counseling the patient face to face. The total time spent in the appointment was 40 minutes and more than 50% was on counseling and review of test results     [facility], [place], MD 08/06/2014 8:26 PM

## 2014-08-06 NOTE — Assessment & Plan Note (Signed)
Thankfully, the patient has no side effects with increased dose of chemotherapy. Repeat blood work showed he has no mutation and the BCR/ABL by PCR technique is improving. I will see him back in 3 months with history, physical examination, and blood work ahead of time.

## 2014-08-07 ENCOUNTER — Encounter: Payer: Self-pay | Admitting: Family Medicine

## 2014-08-07 ENCOUNTER — Telehealth: Payer: Self-pay | Admitting: *Deleted

## 2014-08-07 ENCOUNTER — Ambulatory Visit (INDEPENDENT_AMBULATORY_CARE_PROVIDER_SITE_OTHER): Payer: Medicare HMO | Admitting: Family Medicine

## 2014-08-07 VITALS — BP 100/58 | HR 60 | Temp 98.3°F | Resp 16 | Wt 180.0 lb

## 2014-08-07 DIAGNOSIS — I255 Ischemic cardiomyopathy: Secondary | ICD-10-CM

## 2014-08-07 DIAGNOSIS — I10 Essential (primary) hypertension: Secondary | ICD-10-CM

## 2014-08-07 DIAGNOSIS — A047 Enterocolitis due to Clostridium difficile: Secondary | ICD-10-CM

## 2014-08-07 DIAGNOSIS — J90 Pleural effusion, not elsewhere classified: Secondary | ICD-10-CM

## 2014-08-07 DIAGNOSIS — A0472 Enterocolitis due to Clostridium difficile, not specified as recurrent: Secondary | ICD-10-CM

## 2014-08-07 MED ORDER — FUROSEMIDE 20 MG PO TABS: 20.0000 mg | ORAL_TABLET | Freq: Every day | ORAL | Status: DC

## 2014-08-07 MED ORDER — VANCOMYCIN HCL 125 MG PO CAPS: ORAL_CAPSULE | ORAL | Status: DC

## 2014-08-07 MED ORDER — METRONIDAZOLE 500 MG PO TABS: 500.0000 mg | ORAL_TABLET | Freq: Three times a day (TID) | ORAL | Status: DC

## 2014-08-07 NOTE — Telephone Encounter (Signed)
Received call from patient wife, Joaquim Lai.   Reports that Vancocin is not covered by insurance and is >$1000.00.   Requested MD to advise on new ABTx for C-Diff.

## 2014-08-07 NOTE — Progress Notes (Signed)
Subjective:    Patient ID: Anthony Wolfe, male    DOB: 05-Jan-1946, 68 y.o.   MRN: [id number]  HPI  Wt Readings from Last 3 Encounters:  08/07/14 180 lb (81.647 kg)  08/05/14 180 lb 1.6 oz (81.693 kg)  07/28/14 178 lb (80.74 kg)   Patient continiues to gain weight. Unfortunately the patient's diarrhea has returned. He had 4 loose bowel movements just todayand 5 bowel movements yesterday.  Patient has had 2 episodes of C. Difficile colitis confirmed by repeat stool assays.  Diarrhea returned shortly after discontinuing antibiotics. He is still taking Florastor. He also reports nausea and stomach upset. Past Medical History  Diagnosis Date  . Hyperlipidemia   . HTN (hypertension)   . Allergic rhinitis   . Prostate hypertrophy   . Anxiety   . CAD (coronary artery disease)     a. s/p 3v CABG 2010. b. NSTEMI 05/2014 in setting of VT. Cath impressions: "Recent IMI with occluded SVG to RCA. Has Right to Right collaterals and collaterals from septal and OM. EF 40%."  . TIA (transient ischemic attack)     ASPVD, S./P. right CEA, 2002, and known occluded left carotid  . 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 Ischemic cardiomyopathy     a. Prior EF 36%. b. 2014: 50%. c. 05/2014: EF 35-40% by echo, 40% with inf HK by cath.  . Carotid disease, bilateral     a. Right CEA 2002; known occluded left carotid. b. Duplex 06/2014: known occluded LICA, stable 6-38% RICA s/p CEA.  . Depression   . PVC's (premature ventricular contractions)     a. PVC's with prior Holter showing PVC load of 22%.  . 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 Pneumonia 05/23/2014  . Ventricular tachycardia     a. Admitted with VT 05/2013 - EPS with inducible sustained monomorphic VT; s/p Med[employer] ICD 05/21/14.  . Emphysema of lung   . NSTEMI (non-ST elevation myocardial infarction) 05/2014  . Pleural effusion 05/2014  . Chronic systolic CHF (congestive heart failure)   . C. difficile colitis 06/16/2014   Past  Surgical History  Procedure Laterality Date  . Coronary artery bypass graft  02/2009    3 vessel  . Colonoscopy  07/2010  . Carotidectomy  2003    right side  . Back surgery    . Electrophysiologic study  05/21/14  . Ep implantable device  05/21/14    single chamber Metronic ICD  . Left heart catheterization with coronary/graft angiogram N/A 05/19/2014    Procedure: LEFT HEART CATHETERIZATION WITH Beatrix Fetters;  Surgeon: Josue Hector, MD;  Location: [facility] CATH LAB;  Service: Cardiovascular;  Laterality: N/A;  . Electrophysiology study N/A 05/21/2014    Procedure: ELECTROPHYSIOLOGY STUDY;  Surgeon: Deboraha Sprang, MD;  Location: [facility] CATH LAB;  Service: Cardiovascular;  Laterality: N/A;   Current Outpatient Prescriptions on File Prior to Visit  Medication Sig Dispense Refill  . acetaminophen (TYLENOL) 325 MG tablet Take 1-2 tablets (325-650 mg total) by mouth every 4 (four) hours as needed for mild pain.    Marland Kitchen aspirin 81 MG chewable tablet Chew 1 tablet (81 mg total) by mouth daily.    Marland Kitchen atorvastatin (LIPITOR) 80 MG tablet Take 1 tablet (80 mg total) by mouth daily at 6 PM. 30 tablet 0  . carvedilol (COREG) 3.125 MG tablet Take 1 tablet (3.125 mg total) by mouth 2 (two) times daily. 180 tablet 3  . clopidogrel (PLAVIX) 75 MG tablet Take 1 tablet (  75 mg total) by mouth daily. 30 tablet 0  . dasatinib (SPRYCEL) 100 MG tablet Take 100 mg by mouth daily.    Marland Kitchen guaiFENesin (MUCINEX) 600 MG 12 hr tablet Take 600 mg by mouth 2 (two) times daily as needed for cough or to loosen phlegm.    Marland Kitchen lisinopril (PRINIVIL,ZESTRIL) 2.5 MG tablet Take 1 tablet (2.5 mg total) by mouth daily. 90 tablet 3  . mirtazapine (REMERON) 30 MG tablet Take 1 tablet (30 mg total) by mouth at bedtime. 30 tablet 5  . nitroGLYCERIN (NITROSTAT) 0.4 MG SL tablet Place 1 tablet (0.4 mg total) under the tongue every 5 (five) minutes as needed for chest pain. 25 tablet 4  . potassium chloride SA (K-DUR,KLOR-CON) 20 MEQ  tablet Take 1 tablet (20 mEq total) by mouth daily. 30 tablet 11   No current facility-administered medications on file prior to visit.   History   Social History  . Marital Status: Married    Spouse Name: N/A    Number of Children: 2  . Years of Education: N/A   Occupational History  .      retired Development worker, [employer]   Social History Main Topics  . Smoking status: Former Smoker -- 3.00 packs/day for 45 years    Types: Cigarettes    Quit date: 08/08/1997  . Smokeless tobacco: Former Systems developer  . Alcohol Use: No  . Drug Use: No  . Sexual Activity: Not on file   Other Topics Concern  . Not on file   Social History Narrative   Allergies  Allergen Reactions  . Norvasc [Amlodipine Besylate] Rash    rash  . Sulfa Antibiotics Hives    Hives      Review of Systems  All other systems reviewed and are negative.      Objective:   Physical Exam  Cardiovascular: Normal rate, regular rhythm and normal heart sounds.   Pulmonary/Chest: Effort normal and breath sounds normal.  Abdominal: Soft. He exhibits no distension. Bowel sounds are increased. There is no tenderness. There is no rebound.  Vitals reviewed.         Assessment & Plan:  C. difficile colitis - Plan: vancomycin (VANCOCIN) 125 MG capsule, metroNIDAZOLE (FLAGYL) 500 MG tablet  Essential hypertension - Plan: furosemide (LASIX) 20 MG tablet  Cardiomyopathy, ischemic - Plan: furosemide (LASIX) 20 MG tablet  Pleural effusion - Plan: furosemide (LASIX) 20 MG tablet  This is the patient's second relapse. I will begin Flagyl 500 mg by mouth 3 times a day for 14 days and vancomycin 125 mg tablets with a pulse tapering therapy. He will take 4 pills a day for 2 weeks, then 1 pill twice a day for 7 days then 1 pill once a day for 7 days then 1 pill every other day for 7 days and then 1 pill every third day for 7 days. He is to continue the florastor.  If patient fails treatment this time, I will refer the patient to GI to  consider fecal microflora transplant versus fidaxomicin.

## 2014-08-07 NOTE — Telephone Encounter (Signed)
Call placed to [facility].   Was advised that insurance is denying prescription because it is more than 30 day supply. Patient will be able to pick up the first 4 weeks worth of ABTx for $42.86, and then he will be able to pick up the last 2 weeks.   MD aware.

## 2014-08-11 ENCOUNTER — Ambulatory Visit: Payer: Medicare HMO | Admitting: Family Medicine

## 2014-08-11 ENCOUNTER — Telehealth: Payer: Self-pay | Admitting: Family Medicine

## 2014-08-11 DIAGNOSIS — A0472 Enterocolitis due to Clostridium difficile, not specified as recurrent: Secondary | ICD-10-CM

## 2014-08-11 LAB — FUNGUS CULTURE W SMEAR: Fungal Smear: NONE SEEN

## 2014-08-11 NOTE — Telephone Encounter (Signed)
Pt's wife called and states that the pharmacy had to order the vancomycin and it arrived today and it was $400 and he can not afford this and his diarrhea is getting worse and want to know what to do?

## 2014-08-12 NOTE — Telephone Encounter (Signed)
I am sorry, but I have no other options, he must use the vancomycin.  There are no other abx options that I am aware of.  We could try to get him in with GI to see if they can perform the fecal microflora transplant.

## 2014-08-12 NOTE — Telephone Encounter (Signed)
Is he on the flagyl?  If not be sure he starts flagyl 500 tid for 14 days.  What did is cost the first time.  He was abe to afford that prescription.  Let's try that again, vancomycin 125 poqid for 14 days.  (then we will represcribe it again but taper him down as I recommended)  Recheck here in 1 week.

## 2014-08-12 NOTE — Telephone Encounter (Signed)
Tried to call [employer].  Complete waste of one hour.  Got constant run around from one department to another.  Everyone understood problem but no one knew how to help patient or seemed concerned to do so.  Please advise?

## 2014-08-12 NOTE — Telephone Encounter (Signed)
Does have the Flagyl, has been taking since last Friday. $2.65 was that cost.  Wife says Vancomycin very expensive.  Says pharmacy has tried to break it down to smaller amts but still asking for $200.00.  They can not afford.  Diarrhea got better but now has started up again.  Very nauseated, doesn't want anything by mouth. Wife says she is trying to get him to eat and he will not.

## 2014-08-13 ENCOUNTER — Encounter: Payer: Commercial Managed Care - HMO | Admitting: Internal Medicine

## 2014-08-13 NOTE — Telephone Encounter (Signed)
Pt home today.  Wife says diarrhea is pure water.  Refusing anything PO, just lying in bed.  REFUSES to return to hospital.  Wife states she will go to pharmacy and attempt to get partial fill of Vancomycin and try to piece meal it out so he can start taking.  Not sure how pharmacy will work with them.  Wants to go ahead with GI referral.  Referral initated

## 2014-08-13 NOTE — Telephone Encounter (Signed)
-----   Message from [place] sent at 08/13/2014  9:47 AM EST ----- [phone] (H) patients wife calling back to speak to you regarding previous messages

## 2014-08-13 NOTE — Telephone Encounter (Signed)
Was able to get 10 Vancomycin for $112 to get him started.  Has GI appt for Friday.  Reinforced to her to get him to eat something.  Ensures/Boosts  Drink water.

## 2014-08-14 ENCOUNTER — Telehealth: Payer: Self-pay | Admitting: *Deleted

## 2014-08-14 NOTE — Telephone Encounter (Signed)
Submitted [employer] referral thru acuity connect for authorization to Dr. Penelope Coop at [facility] with authorization number [id number]

## 2014-08-15 DIAGNOSIS — R197 Diarrhea, unspecified: Secondary | ICD-10-CM | POA: Diagnosis not present

## 2014-08-18 DIAGNOSIS — R197 Diarrhea, unspecified: Secondary | ICD-10-CM | POA: Diagnosis not present

## 2014-08-22 ENCOUNTER — Ambulatory Visit: Payer: Commercial Managed Care - HMO | Admitting: Pulmonary Disease

## 2014-08-25 ENCOUNTER — Telehealth: Payer: Self-pay | Admitting: Family Medicine

## 2014-08-25 NOTE — Telephone Encounter (Signed)
Patient is calling to  See if we can put in referral for dr sood heart doctor if possible    [phone]

## 2014-08-26 NOTE — Telephone Encounter (Signed)
Submitted authorization thru acuity connect with authorization number [id number] for Dr.Sood at [place]   Dx: J94.8-other specified pleural conditions  Number of visits: 6  Valid from 08/27/14-02/23/15  Faxed paper copy to [facility], pt has appt on 08/27/14

## 2014-08-27 ENCOUNTER — Encounter: Payer: Self-pay | Admitting: Pulmonary Disease

## 2014-08-27 ENCOUNTER — Ambulatory Visit (INDEPENDENT_AMBULATORY_CARE_PROVIDER_SITE_OTHER): Payer: Commercial Managed Care - HMO | Admitting: Pulmonary Disease

## 2014-08-27 ENCOUNTER — Ambulatory Visit (INDEPENDENT_AMBULATORY_CARE_PROVIDER_SITE_OTHER)
Admission: RE | Admit: 2014-08-27 | Discharge: 2014-08-27 | Disposition: A | Discharge status: Home / Self Care | Payer: Commercial Managed Care - HMO | Source: Ambulatory Visit | Attending: Pulmonary Disease | Admitting: Pulmonary Disease

## 2014-08-27 VITALS — BP 118/80 | HR 98 | Temp 98.2°F | Ht 69.0 in | Wt 176.6 lb

## 2014-08-27 DIAGNOSIS — J948 Other specified pleural conditions: Secondary | ICD-10-CM | POA: Diagnosis not present

## 2014-08-27 DIAGNOSIS — J90 Pleural effusion, not elsewhere classified: Secondary | ICD-10-CM

## 2014-08-27 DIAGNOSIS — J9811 Atelectasis: Secondary | ICD-10-CM | POA: Diagnosis not present

## 2014-08-27 LAB — AFB CULTURE WITH SMEAR (NOT AT ARMC): ACID FAST SMEAR: NONE SEEN

## 2014-08-27 NOTE — Patient Instructions (Signed)
Follow up in 8 weeks with chest xray

## 2014-08-27 NOTE — Progress Notes (Signed)
Chief Complaint  Patient presents with  . Follow-up    CXR today. Pt c/o chest tightness across central chest. Pt states that his cough is worsening and mucus production is white,thick. Pt noticed worsening chest tightness x 2 days.     History of Present Illness: Anthony Wolfe is a 69 y.o. male former smoker with persistent Rt pleural effusion.  His breathing was okay until the last 3 days.  He thinks he has gotten a cold.  He has cough with chest congestion and clear sputum.  His chest gets sore with coughing.  He denies fever.  TESTS: 05/17/14 Echo >> EF 35 to 44%, grade 2 diastolic dysfx 62/86/38 Rt thoracentesis >> glucose 100, protein 3.7, LDH 108, WBC 517 (90% lymphs), cytology >> reactive mesothelial cells positive for calretinin, cytokeratin 5/6, WT-1 06/10/14 CT chest >> Rt effusion, ASD RLL, emphysema, 3 mm nodule Lt lung 07/15/14 Rt thoracentesis >> 1.5 liters, LDH 101, WBC 947 (72L, 36M), ADA 10.4, cytology >> reactive mesothelial cells  Past medical hx >> HTN, HLD, CAD, ischemic cardiomyopathy, BPH, Anxiety, Depression, CML  Past surgical hx, Medications, Allergies, Family hx, Social hx all reviewed.   Physical Exam: Blood pressure 118/80, pulse 98, temperature 98.2 F (36.8 C), temperature source Oral, height 5\' 9"  (1.753 m), weight 176 lb 9.6 oz (80.105 kg), SpO2 98 %. Body mass index is 26.07 kg/(m^2).  General - No distress ENT - No sinus tenderness, no oral exudate, no LAN Cardiac - s1s2 regular, no murmur Chest - decreased BS Rt base with dullness, no wheeze/rales Back - No focal tenderness Abd - Soft, non-tender Ext - No edema Neuro - Normal strength Skin - No rashes Psych - normal mood, and behavior  Dg Chest 2 View  08/27/2014   CLINICAL DATA:  Subsequent encounter for pleural effusion  EXAM: CHEST  2 VIEW  COMPARISON:  07/28/2014  FINDINGS: Small right pleural effusion persist. There is associated right basilar atelectasis. Left lung is clear. Single lead  pacer/ AICD appears unchanged. Patient is status post CABG. Imaged bony structures of the thorax are intact.  IMPRESSION: Stable exam. Right base atelectasis with small right pleural effusion.   Electronically Signed   By: Misty Stanley M.D.   On: 08/27/2014 13:51    Assessment/Plan:  Persistent Rt pleural effusion.   Discussion: Initial concern was for parapneumonia pleural effusion in November 2015.  However, his fluid has persistent.  He is on dasatinib for CLL >> this can cause pleural effusion, but seems less likely as cause.  His effusion on CXR is small and stable compared to CXR from December 2015.  He was relatively symptom free until the last few days >> most likely has developed viral URI. Plan: - continue clinical observation - f/u CXR in 8 weeks - if his symptoms progress or fluid increases, then consider repeat CT chest and thoracic surgery evaluation - will defer to oncology about management of dasatinib   Chesley Mires, MD [facility] Pager:  [phone]

## 2014-09-02 DIAGNOSIS — A047 Enterocolitis due to Clostridium difficile: Secondary | ICD-10-CM | POA: Diagnosis not present

## 2014-09-03 ENCOUNTER — Telehealth: Payer: Self-pay | Admitting: Hematology and Oncology

## 2014-09-03 NOTE — Telephone Encounter (Signed)
pt called to r/s appt due to jury duty....pt ok and aware.Marland KitchenMarland Kitchen

## 2014-09-05 ENCOUNTER — Other Ambulatory Visit: Payer: Self-pay | Admitting: Cardiology

## 2014-09-08 ENCOUNTER — Encounter: Payer: Self-pay | Admitting: Family Medicine

## 2014-09-08 ENCOUNTER — Ambulatory Visit (INDEPENDENT_AMBULATORY_CARE_PROVIDER_SITE_OTHER): Payer: Medicare HMO | Admitting: Family Medicine

## 2014-09-08 VITALS — BP 110/70 | HR 78 | Temp 98.3°F | Resp 16 | Wt 177.0 lb

## 2014-09-08 DIAGNOSIS — J208 Acute bronchitis due to other specified organisms: Secondary | ICD-10-CM

## 2014-09-08 NOTE — Progress Notes (Signed)
Subjective:    Patient ID: Anthony Wolfe, male    DOB: Jul 27, 1946, 69 y.o.   MRN: [id number]  HPI Patient has a history of COPD. Over the last week he has had a cough productive of clear and white mucus. He reports a lot of chest congestion which is gradually clearing up on Mucinex 1200 mg twice daily. He denies any fevers or chills. He does have dyspnea on exertion but this is been chronic ever since his heart attack. He denies any pleurisy or chest pain. He denies any hemoptysis. Past Medical History  Diagnosis Date  . Hyperlipidemia   . HTN (hypertension)   . Allergic rhinitis   . Prostate hypertrophy   . Anxiety   . CAD (coronary artery disease)     a. s/p 3v CABG 2010. b. NSTEMI 05/2014 in setting of VT. Cath impressions: "Recent IMI with occluded SVG to RCA. Has Right to Right collaterals and collaterals from septal and OM. EF 40%."  . TIA (transient ischemic attack)     ASPVD, S./P. right CEA, 2002, and known occluded left carotid  . 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 Ischemic cardiomyopathy     a. Prior EF 36%. b. 2014: 50%. c. 05/2014: EF 35-40% by echo, 40% with inf HK by cath.  . Carotid disease, bilateral     a. Right CEA 2002; known occluded left carotid. b. Duplex 06/2014: known occluded LICA, stable 0-71% RICA s/p CEA.  . Depression   . PVC's (premature ventricular contractions)     a. PVC's with prior Holter showing PVC load of 22%.  . 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 Pneumonia 05/23/2014  . Ventricular tachycardia     a. Admitted with VT 05/2013 - EPS with inducible sustained monomorphic VT; s/p Medtronic ICD 05/21/14.  . Emphysema of lung   . NSTEMI (non-ST elevation myocardial infarction) 05/2014  . Pleural effusion 05/2014  . Chronic systolic CHF (congestive heart failure)   . C. difficile colitis 06/16/2014   Past Surgical History  Procedure Laterality Date  . Coronary artery bypass graft  02/2009    3 vessel  . Colonoscopy  07/2010  .  Carotidectomy  2003    right side  . Back surgery    . Electrophysiologic study  05/21/14  . Ep implantable device  05/21/14    single chamber Metronic ICD  . Left heart catheterization with coronary/graft angiogram N/A 05/19/2014    Procedure: LEFT HEART CATHETERIZATION WITH Beatrix Fetters;  Surgeon: Josue Hector, MD;  Location: [facility] CATH LAB;  Service: Cardiovascular;  Laterality: N/A;  . Electrophysiology study N/A 05/21/2014    Procedure: ELECTROPHYSIOLOGY STUDY;  Surgeon: Deboraha Sprang, MD;  Location: [facility] CATH LAB;  Service: Cardiovascular;  Laterality: N/A;   Current Outpatient Prescriptions on File Prior to Visit  Medication Sig Dispense Refill  . acetaminophen (TYLENOL) 325 MG tablet Take 1-2 tablets (325-650 mg total) by mouth every 4 (four) hours as needed for mild pain.    Marland Kitchen aspirin 81 MG chewable tablet Chew 1 tablet (81 mg total) by mouth daily.    Marland Kitchen atorvastatin (LIPITOR) 80 MG tablet TAKE 1 TABLET EVERY DAY AT 6:00 PM 90 tablet 0  . carvedilol (COREG) 3.125 MG tablet Take 1 tablet (3.125 mg total) by mouth 2 (two) times daily. 180 tablet 3  . clopidogrel (PLAVIX) 75 MG tablet Take 1 tablet (75 mg total) by mouth daily. 30 tablet 0  . dasatinib (SPRYCEL) 100 MG tablet Take 100  mg by mouth daily.    . furosemide (LASIX) 20 MG tablet Take 1 tablet (20 mg total) by mouth daily. 30 tablet 9  . guaiFENesin (MUCINEX) 600 MG 12 hr tablet Take 600 mg by mouth 2 (two) times daily as needed for cough or to loosen phlegm.    Marland Kitchen lisinopril (PRINIVIL,ZESTRIL) 2.5 MG tablet Take 1 tablet (2.5 mg total) by mouth daily. 90 tablet 3  . mirtazapine (REMERON) 30 MG tablet Take 1 tablet (30 mg total) by mouth at bedtime. 30 tablet 5  . nitroGLYCERIN (NITROSTAT) 0.4 MG SL tablet Place 1 tablet (0.4 mg total) under the tongue every 5 (five) minutes as needed for chest pain. 25 tablet 4  . potassium chloride SA (K-DUR,KLOR-CON) 20 MEQ tablet Take 1 tablet (20 mEq total) by mouth daily. 30  tablet 11  . vancomycin (VANCOCIN) 125 MG capsule Take as directed 85 capsule 0   No current facility-administered medications on file prior to visit.   Allergies  Allergen Reactions  . Norvasc [Amlodipine Besylate] Rash    rash  . Sulfa Antibiotics Hives    Hives    History   Social History  . Marital Status: Married    Spouse Name: N/A    Number of Children: 2  . Years of Education: N/A   Occupational History  .      retired Development worker, [employer]   Social History Main Topics  . Smoking status: Former Smoker -- 3.00 packs/day for 45 years    Types: Cigarettes    Quit date: 08/08/1997  . Smokeless tobacco: Former Systems developer  . Alcohol Use: No  . Drug Use: No  . Sexual Activity: Not on file   Other Topics Concern  . Not on file   Social History Narrative      Review of Systems  All other systems reviewed and are negative.      Objective:   Physical Exam  Constitutional: He appears well-developed and well-nourished.  HENT:  Right Ear: External ear normal.  Left Ear: External ear normal.  Nose: Nose normal.  Mouth/Throat: Oropharynx is clear and moist. No oropharyngeal exudate.  Eyes: Conjunctivae are normal. Pupils are equal, round, and reactive to light.  Neck: Neck supple.  Cardiovascular: Normal rate and regular rhythm.   Pulmonary/Chest: Effort normal and breath sounds normal. No respiratory distress. He has no wheezes. He has no rales.  Vitals reviewed.         Assessment & Plan:  Viral bronchitis  Patient has viral bronchitis. I recommended continued tincture of time and Mucinex 1200 mg by mouth twice a day. If symptoms worsen the patient can start taking a Z-Pak however given his recent struggles with Clostridium difficile I'm very hesitant to give this patient an antibiotic.

## 2014-09-15 ENCOUNTER — Encounter: Payer: Self-pay | Admitting: Internal Medicine

## 2014-09-15 ENCOUNTER — Ambulatory Visit (INDEPENDENT_AMBULATORY_CARE_PROVIDER_SITE_OTHER): Payer: Commercial Managed Care - HMO | Admitting: Internal Medicine

## 2014-09-15 VITALS — BP 138/76 | HR 88 | Ht 69.0 in | Wt 177.6 lb

## 2014-09-15 DIAGNOSIS — I255 Ischemic cardiomyopathy: Secondary | ICD-10-CM | POA: Diagnosis not present

## 2014-09-15 DIAGNOSIS — I472 Ventricular tachycardia, unspecified: Secondary | ICD-10-CM

## 2014-09-15 DIAGNOSIS — Z4502 Encounter for adjustment and management of automatic implantable cardiac defibrillator: Secondary | ICD-10-CM

## 2014-09-15 LAB — MDC_IDC_ENUM_SESS_TYPE_INCLINIC
Battery Remaining Longevity: 135 mo
Battery Voltage: 3.1 V
Date Time Interrogation Session: 20160208080523
HighPow Impedance: 171 Ohm
HighPow Impedance: 46 Ohm
Lead Channel Impedance Value: 456 Ohm
Lead Channel Pacing Threshold Amplitude: 0.5 V
Lead Channel Pacing Threshold Pulse Width: 0.4 ms
Lead Channel Sensing Intrinsic Amplitude: 8.25 mV
Lead Channel Setting Pacing Amplitude: 2 V
Lead Channel Setting Pacing Pulse Width: 0.4 ms
Lead Channel Setting Sensing Sensitivity: 0.3 mV
MDC IDC MSMT LEADCHNL RV SENSING INTR AMPL: 9.75 mV
MDC IDC SET ZONE DETECTION INTERVAL: 370 ms
MDC IDC STAT BRADY RV PERCENT PACED: 0 %
Zone Setting Detection Interval: 300 ms
Zone Setting Detection Interval: 340 ms

## 2014-09-15 NOTE — Progress Notes (Signed)
Patient Care Team: [name], MD as PCP - General (Family Medicine) [name], MD as Attending Physician (Cardiology) [name], MD (Family Medicine) [name], MD (Cardiothoracic Surgery) [name], MD as Attending Physician (Vascular Surgery) [name], MD as Consulting Physician (Hematology and Oncology)   HPI  Anthony Wolfe is a 69 y.o. male Seen in follow-up for an ICD implanted for secondary prevention after he presented with ventricular tachycardia 10/15. Catheterization demonstrated occluded vein graft to his RCA ejection fraction was about 40%  He is status post bypass surgery 2010  He underwent EP testing and was found to be inducible.  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   He underwent a thoracentesis initially in 11/15 and redo 12/15.  He saw pulm 1/16 with question as to whether immunotherpay could be explanation of effusion   He has had recurrent orthostatic syncope;  Both episodes have occurred in the context of  C Diff and is now on taper therapy     Past Medical History  Diagnosis Date  . Hyperlipidemia   . HTN (hypertension)   . Allergic rhinitis   . Prostate hypertrophy   . Anxiety   . CAD (coronary artery disease)     a. s/p 3v CABG 2010. b. NSTEMI 05/2014 in setting of VT. Cath impressions: "Recent IMI with occluded SVG to RCA. Has Right to Right collaterals and collaterals from septal and OM. EF 40%."  . TIA (transient ischemic attack)     ASPVD, S./P. right CEA, 2002, and known occluded left carotid  . 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 Ischemic cardiomyopathy     a. Prior EF 36%. b. 2014: 50%. c. 05/2014: EF 35-40% by echo, 40% with inf HK by cath.  . Carotid disease, bilateral     a. Right CEA 2002; known occluded left carotid. b. Duplex 06/2014: known occluded LICA, stable 3-26% RICA s/p CEA.  . Depression    . PVC's (premature ventricular contractions)     a. PVC's with prior Holter showing PVC load of 22%.  . 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 Pneumonia 05/23/2014  . Ventricular tachycardia     a. Admitted with VT 05/2013 - EPS with inducible sustained monomorphic VT; s/p Medtronic ICD 05/21/14.  . Emphysema of lung   . NSTEMI (non-ST elevation myocardial infarction) 05/2014  . Pleural effusion 05/2014  . Chronic systolic CHF (congestive heart failure)   . C. difficile colitis 06/16/2014    Past Surgical History  Procedure Laterality Date  . Coronary artery bypass graft  02/2009    3 vessel  . Colonoscopy  07/2010  . Carotidectomy  2003    right side  . Back surgery    . Electrophysiologic study  05/21/14  . Ep implantable device  05/21/14    single chamber Metronic ICD  . Left heart catheterization with coronary/graft angiogram N/A 05/19/2014    Procedure: LEFT HEART CATHETERIZATION WITH Beatrix Fetters;  Surgeon: Josue Hector, MD;  Location: [facility] CATH LAB;  Service: Cardiovascular;  Laterality: N/A;  . Electrophysiology study N/A 05/21/2014    Procedure: ELECTROPHYSIOLOGY STUDY;  Surgeon: Deboraha Sprang, MD;  Location: [facility] CATH LAB;  Service: Cardiovascular;  Laterality: N/A;    Current Outpatient Prescriptions  Medication Sig Dispense Refill  . aspirin 81 MG chewable tablet Chew 1 tablet (81 mg total) by mouth daily.    Marland Kitchen atorvastatin (  LIPITOR) 80 MG tablet TAKE 1 TABLET EVERY DAY AT 6:00 PM 90 tablet 0  . carvedilol (COREG) 3.125 MG tablet Take 1 tablet (3.125 mg total) by mouth 2 (two) times daily. 180 tablet 3  . clopidogrel (PLAVIX) 75 MG tablet Take 1 tablet (75 mg total) by mouth daily. 30 tablet 0  . dasatinib (SPRYCEL) 100 MG tablet Take 100 mg by mouth daily.    . furosemide (LASIX) 20 MG tablet Take 20 mg by mouth daily as needed (swelling).    Marland Kitchen guaiFENesin (MUCINEX) 600 MG 12 hr tablet Take 1200 mg by mouth 2-3 times a day as needed for cough or to  loosen phlegm    . lisinopril (PRINIVIL,ZESTRIL) 2.5 MG tablet Take 1 tablet (2.5 mg total) by mouth daily. 90 tablet 3  . mirtazapine (REMERON) 30 MG tablet Take 1 tablet (30 mg total) by mouth at bedtime. 30 tablet 5  . nitroGLYCERIN (NITROSTAT) 0.4 MG SL tablet Place 1 tablet (0.4 mg total) under the tongue every 5 (five) minutes as needed for chest pain. 25 tablet 4  . potassium chloride SA (K-DUR,KLOR-CON) 20 MEQ tablet Take 1 tablet (20 mEq total) by mouth daily. 30 tablet 11  . vancomycin (VANCOCIN) 125 MG capsule Take as directed 85 capsule 0   No current facility-administered medications for this visit.    Allergies  Allergen Reactions  . Norvasc [Amlodipine Besylate] Rash    rash  . Sulfa Antibiotics Hives    Hives     Review of Systems negative except from HPI and PMH  Physical Exam BP 138/76 mmHg  Pulse 88  Ht 5\' 9"  (1.753 m)  Wt 177 lb 9.6 oz (80.559 kg)  BMI 26.22 kg/m2 Well developed and well nourished in no acute distress HENT normal E scleral and icterus clear Neck Supple JVP flat; carotids brisk and full Device pocket well healed; without hematoma or erythema.  There is no tethering   decreased breath sounds right lung  Regular rate and rhythm, no murmurs gallops or rub Soft with active bowel sounds No clubbing cyanosis  Edema Alert and oriented, grossly normal motor and sensory function Skin Warm and Dry   ECG demonstrates sinus rhythm at 60 Intervals 20[birth year]44   Assessment and  Plan  Ventricular tachycardia No intercurrent Ventricular tachycardia  Syncope -orthostatic recurretn   ischemic cardiomyopathy depressed LV function and prior bypass  Implantable defibrillator-Medtronic   The patient has had no interval ventricular tachycardia  He continues with dyspnea on exertion. NO evidence of effusion   There is no evidence of volume overload.  He has had recurrent orthostatic syncope which precludes uptitration of his afterload reduction for  now.  This occurred in the context of anorexia and diarrhea assoc with C Diff  BP is borderline elevated   We will have him measure at home

## 2014-09-15 NOTE — Patient Instructions (Addendum)
Your physician recommends that you continue on your current medications as directed. Please refer to the Current Medication list given to you today.  Your physician recommends that you schedule a follow-up appointment in: 4 months with Dr. Radford Pax.  Remote monitoring is used to monitor your Pacemaker of ICD from home. This monitoring reduces the number of office visits required to check your device to one time per year. It allows [place] to keep an eye on the functioning of your device to ensure it is working properly. You are scheduled for a device check from home on 12/15/14. You may send your transmission at any time that day. If you have a wireless device, the transmission will be sent automatically. After your physician reviews your transmission, you will receive a postcard with your next transmission date.  Your physician wants you to follow-up in: October 2016 with Dr. Caryl Comes.  You will receive a reminder letter in the mail two months in advance. If you don't receive a letter, please call our office to schedule the follow-up appointment.

## 2014-09-17 ENCOUNTER — Telehealth: Payer: Self-pay | Admitting: *Deleted

## 2014-09-17 NOTE — Telephone Encounter (Signed)
Submitted humana referral thru acuity connect for authorization to Dr. Virl Axe, MD Cardiologist with authorization number [id number]   Dx: I47.2-Ventricular Tachycardia       Z45.02- Encntr for adjustment and mgmt of automatic inplntbl card defib  Number of visits: 6

## 2014-09-18 ENCOUNTER — Telehealth: Payer: Self-pay | Admitting: *Deleted

## 2014-09-18 NOTE — Telephone Encounter (Signed)
Submitted humana referral thru acuity connect for authorization to Dr. Ladean Raya with authorization [id number]   Dx: Thereasa Distance.7-Enterocolitis due to C-Diff       R19.7-Diarrhea, unspecified  Number of visits: 6  Start Date: 10/02/14- End Date: 03/31/15  Paper copy sent to [facility] for review/records

## 2014-09-23 ENCOUNTER — Telehealth: Payer: Self-pay | Admitting: *Deleted

## 2014-09-23 NOTE — Telephone Encounter (Signed)
Submitted humana referral thru acuity connect for authorization to Dr. Gavin Pound, MD rheumatologist at [place] with authorization number [id number]   Dx: M19.90-unspecified osteoarthritis, unspecified sites  Number of visits: 6  Start Date: 09/25/14- End Date: 03/24/15  Paper copy has been faxed to for records/review

## 2014-10-08 ENCOUNTER — Telehealth: Payer: Self-pay | Admitting: *Deleted

## 2014-10-08 NOTE — Telephone Encounter (Signed)
Submitted humana referral thru acuity connect for authorization to Dr. Fransico Him, MD cardiologist with authorization number [id number]   Requesting provider: Cammie Mcgee. Dennard Schaumann, MD  Treating provider: Fransico Him, MD  PCP: Olean Ree. [place]  Number of visits:6  Start Date: 01/13/15- end date: 07/12/15  Dx:I47.2-Ventricular Tachycardia      R55- Syncope and collapse      Z95.810- Presence of automatic(implantable) cardiac defibrillator      I25.5- Ischemic cardiomyopathy  Copy has been sent to [facility] for review

## 2014-10-08 NOTE — Telephone Encounter (Signed)
Submitted humana referral thru acuity connect for authorization to Dr. [name], MD oncologist with authorization number [id number]   Requesting provider: Cammie Mcgee. Dennard Schaumann, MD  Treating provider: [name], MD  PCP: Olean Ree. 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

## 2014-10-21 ENCOUNTER — Telehealth: Payer: Self-pay | Admitting: Pulmonary Disease

## 2014-10-21 NOTE — Telephone Encounter (Signed)
Spoke with pt's wife, Joaquim Lai. An appointment was made for tomorrow to see RA. Kelli cancelled this appointment and told her that he needed to be seen today. Asked pt's wife if he felt like he needed to be seen today, she said they could wait until tomorrow. Their appointment has been rescheduled with RA tomorrow at 2pm.

## 2014-10-22 ENCOUNTER — Ambulatory Visit (INDEPENDENT_AMBULATORY_CARE_PROVIDER_SITE_OTHER)
Admission: RE | Admit: 2014-10-22 | Discharge: 2014-10-22 | Disposition: A | Discharge status: Home / Self Care | Payer: Commercial Managed Care - HMO | Source: Ambulatory Visit | Attending: Pulmonary Disease | Admitting: Pulmonary Disease

## 2014-10-22 ENCOUNTER — Ambulatory Visit: Payer: Commercial Managed Care - HMO | Admitting: Pulmonary Disease

## 2014-10-22 ENCOUNTER — Telehealth: Payer: Self-pay | Admitting: Pulmonary Disease

## 2014-10-22 ENCOUNTER — Ambulatory Visit (INDEPENDENT_AMBULATORY_CARE_PROVIDER_SITE_OTHER): Payer: Commercial Managed Care - HMO | Admitting: Pulmonary Disease

## 2014-10-22 ENCOUNTER — Encounter: Payer: Self-pay | Admitting: Pulmonary Disease

## 2014-10-22 VITALS — BP 106/64 | HR 84 | Ht 69.0 in | Wt 185.8 lb

## 2014-10-22 DIAGNOSIS — J90 Pleural effusion, not elsewhere classified: Secondary | ICD-10-CM | POA: Diagnosis not present

## 2014-10-22 DIAGNOSIS — J948 Other specified pleural conditions: Secondary | ICD-10-CM

## 2014-10-22 DIAGNOSIS — R0602 Shortness of breath: Secondary | ICD-10-CM | POA: Diagnosis not present

## 2014-10-22 NOTE — Progress Notes (Signed)
   Subjective:    Patient ID: Anthony Wolfe, male    DOB: 1946-07-29, 69 y.o.   MRN: [id number]  HPI  69 y.o. male former smoker with CML on dasatinib & Rt pleural effusion.  His breathing was okay until the last 3 days. He thinks he has gotten a cold. He has cough with chest congestion and clear sputum. His chest gets sore with coughing. He denies fever.  TESTS: 05/17/14 Echo >> EF 35 to 85%, grade 2 diastolic dysfx 92/92/44 Rt thoracentesis >> glucose 100, protein 3.7, LDH 108, WBC 517 (90% lymphs), cytology >> reactive mesothelial cells positive for calretinin, cytokeratin 5/6, WT-1 06/10/14 CT chest >> Rt effusion, ASD RLL, emphysema, 3 mm nodule Lt lung 07/15/14 Rt thoracentesis >> 1.5 liters, LDH 101, WBC 947 (72L, 54M), ADA 10.4, cytology >> reactive mesothelial cells   Review of Systems neg for any significant sore throat, dysphagia, itching, sneezing, nasal congestion or excess/ purulent secretions, fever, chills, sweats, unintended wt loss, pleuritic or exertional cp, hempoptysis, orthopnea pnd or change in chronic leg swelling. Also denies presyncope, palpitations, heartburn, abdominal pain, nausea, vomiting, diarrhea or change in bowel or urinary habits, dysuria,hematuria, rash, arthralgias, visual complaints, headache, numbness weakness or ataxia.     Objective:   Physical Exam  Gen. Pleasant, well-nourished, in no distress ENT - no lesions, no post nasal drip Neck: No JVD, no thyromegaly, no carotid bruits Lungs: no use of accessory muscles,  dullness to percussion, decreased BS with rales at right base 1/3, no rhonchi  Cardiovascular: Rhythm regular, heart sounds  normal, no murmurs or gallops, no peripheral edema Musculoskeletal: No deformities, no cyanosis or clubbing        Assessment & Plan:

## 2014-10-22 NOTE — Patient Instructions (Signed)
Thoracentesis planned for tomorrow 0715 am - Anthony Wolfe admitting We discussed risks & benefits We will discuss with dr Alvy Bimler

## 2014-10-22 NOTE — Assessment & Plan Note (Addendum)
Chest x-ray shows recurrent large effusion -this has been previously noted to be a lymphocytic exudate with reactive mesothelial cells Proceed with thoracentesis again-if remains exudative, will discuss with oncology stopping dasatinib versus obtaining pleural biopsy -he does not have any systemic complaints to consider the possibility of a tuberculous effusion. However he would be uncommon to have drug-induced effusion to be unilateral

## 2014-10-22 NOTE — Telephone Encounter (Signed)
Attempted to call pt. No answer, no option to leave a message.

## 2014-10-23 ENCOUNTER — Ambulatory Visit (HOSPITAL_BASED_OUTPATIENT_CLINIC_OR_DEPARTMENT_OTHER)
Admission: RE | Admit: 2014-10-23 | Discharge: 2014-10-23 | Disposition: A | Discharge status: Home / Self Care | Payer: Commercial Managed Care - HMO | Source: Ambulatory Visit | Attending: Internal Medicine | Admitting: Internal Medicine

## 2014-10-23 ENCOUNTER — Ambulatory Visit (HOSPITAL_COMMUNITY)
Admission: RE | Admit: 2014-10-23 | Discharge: 2014-10-23 | Disposition: A | Discharge status: Home / Self Care | Payer: Commercial Managed Care - HMO | Source: Ambulatory Visit | Attending: Family Medicine | Admitting: Family Medicine

## 2014-10-23 ENCOUNTER — Ambulatory Visit (HOSPITAL_COMMUNITY)
Admission: RE | Admit: 2014-10-23 | Discharge: 2014-10-23 | Disposition: A | Discharge status: Home / Self Care | Payer: Commercial Managed Care - HMO | Source: Ambulatory Visit | Attending: Pulmonary Disease | Admitting: Pulmonary Disease

## 2014-10-23 DIAGNOSIS — R001 Bradycardia, unspecified: Secondary | ICD-10-CM | POA: Insufficient documentation

## 2014-10-23 DIAGNOSIS — I2510 Atherosclerotic heart disease of native coronary artery without angina pectoris: Secondary | ICD-10-CM | POA: Insufficient documentation

## 2014-10-23 DIAGNOSIS — J90 Pleural effusion, not elsewhere classified: Secondary | ICD-10-CM | POA: Diagnosis not present

## 2014-10-23 DIAGNOSIS — E785 Hyperlipidemia, unspecified: Secondary | ICD-10-CM | POA: Insufficient documentation

## 2014-10-23 DIAGNOSIS — I252 Old myocardial infarction: Secondary | ICD-10-CM | POA: Diagnosis not present

## 2014-10-23 DIAGNOSIS — I255 Ischemic cardiomyopathy: Secondary | ICD-10-CM | POA: Insufficient documentation

## 2014-10-23 DIAGNOSIS — C9210 Chronic myeloid leukemia, BCR/ABL-positive, not having achieved remission: Secondary | ICD-10-CM | POA: Diagnosis not present

## 2014-10-23 DIAGNOSIS — I959 Hypotension, unspecified: Secondary | ICD-10-CM | POA: Insufficient documentation

## 2014-10-23 DIAGNOSIS — Z87891 Personal history of nicotine dependence: Secondary | ICD-10-CM | POA: Diagnosis not present

## 2014-10-23 DIAGNOSIS — Z95810 Presence of automatic (implantable) cardiac defibrillator: Secondary | ICD-10-CM | POA: Insufficient documentation

## 2014-10-23 DIAGNOSIS — I10 Essential (primary) hypertension: Secondary | ICD-10-CM | POA: Diagnosis not present

## 2014-10-23 DIAGNOSIS — Z951 Presence of aortocoronary bypass graft: Secondary | ICD-10-CM | POA: Insufficient documentation

## 2014-10-23 DIAGNOSIS — D72820 Lymphocytosis (symptomatic): Secondary | ICD-10-CM | POA: Diagnosis not present

## 2014-10-23 DIAGNOSIS — I5022 Chronic systolic (congestive) heart failure: Secondary | ICD-10-CM | POA: Diagnosis not present

## 2014-10-23 LAB — PROTEIN, BODY FLUID: TOTAL PROTEIN, FLUID: 3.7 g/dL

## 2014-10-23 LAB — BODY FLUID CELL COUNT WITH DIFFERENTIAL
EOS FL: 0 %
LYMPHS FL: 55 %
Monocyte-Macrophage-Serous Fluid: 44 % — ABNORMAL LOW (ref 50–90)
NEUTROPHIL FLUID: 1 % (ref 0–25)
Other Cells, Fluid: 0 %
WBC FLUID: 750 uL (ref 0–1000)

## 2014-10-23 LAB — LACTATE DEHYDROGENASE, PLEURAL OR PERITONEAL FLUID: LD, Fluid: 80 U/L — ABNORMAL HIGH (ref 3–23)

## 2014-10-23 NOTE — Progress Notes (Signed)
Thoracentesis performed by Dr. Titus Mould. During procedure pt became nauseated, HR decreased into 46'K, systolic  BP in the 59'D.  1.4L off and pt was laid flat. Pt quickly started to improve and has no chest pain.  Cardiology called and EKG done, Chest Xray done, Dr. Titus Mould has seen results and is ok for pt to discharge at 1020.   BP 174/67 HR 70 Spo2 96% on RA  RR 16 Pt is awake and alert, complains of no pain, pt is in no distress at this time.

## 2014-10-23 NOTE — Telephone Encounter (Signed)
lmtcb for pt.  

## 2014-10-23 NOTE — Procedures (Signed)
Thoracentesis Procedure Note  Pre-operative Diagnosis: rt effusion, re occurent, cml  Post-operative Diagnosis: same  Indications: sob, r/o malignancy  Procedure Details  Consent: Informed consent was obtained. Risks of the procedure were discussed including: infection, bleeding, pain, pneumothorax.  Under sterile conditions the patient was positioned. Betadine solution and sterile drapes were utilized.  1% buffered lidocaine was used to anesthetize the 7th rib space. Fluid was obtained without any difficulties and minimal blood loss.  A dressing was applied to the wound and wound care instructions were provided.   Findings 1400 clear slight amber tinge  ml of clear pleural fluid was obtained. A sample was sent to Pathology for cytogenetics, flow, and cell counts, as well as for infection analysis.  Complications:  Had some brady (nausea) mid way through, vaso vagal? Improved on own after procedure,           Condition: brady see above, no loc  Plan A follow up chest x-ray was ordered. Bed Rest for 1 hours. Tylenol 650 mg. for pain.  Attending Attestation: I performed the procedure.  US guidance  [name]. Titus Mould, MD, [place] Pgr: [facility]

## 2014-10-23 NOTE — Progress Notes (Signed)
Patient ID: Anthony Wolfe, male   DOB: 03/28/1946, 69 y.o.   MRN: [id number]  During thoracentesis - brady ( vaso vagal?) 1.4 liters off, tolerated well over all except above Stop procedure Lay flat Improved hr, bp No sob, no cp [place] chest anterior, sliding lung present Equal BS  Will call cards ecg pcxr  [name]. Titus Mould, MD, [place] Pgr: [facility] '

## 2014-10-23 NOTE — Consult Note (Signed)
Primary cardiologist: SK  HPI: 69 year old male with past medical history of coronary artery disease status post coronary artery bypass and graft, ischemic cardiomyopathy, prior ICD, leukemia, recurrent pleural effusion for evaluation of bradycardia. Patient had thoracentesis today. Prior to the procedure he has noticed dyspnea on exertion attributed to his pleural effusion. There is orthopnea but no PND or pedal edema. No chest pain or syncope. During his thoracentesis he developed mild nausea, diaphoresis, warm feeling and dizziness. He did not have chest pain or frank syncope. His heart rate decreased into the 40s by report with a systolic blood pressure in the 60s. This lasted approximately 10 minutes by report and then improved. The patient now states he is completely normal and his dyspnea has improved following thoracentesis. Of note he apparently had a syncopal episode previously during thoracentesis.   (Not in a hospital admission)  Allergies  Allergen Reactions  . Norvasc [Amlodipine Besylate] Rash    rash  . Sulfa Antibiotics Hives    Hives     Past Medical History  Diagnosis Date  . Hyperlipidemia   . HTN (hypertension)   . Allergic rhinitis   . Prostate hypertrophy   . Anxiety   . CAD (coronary artery disease)     a. s/p 3v CABG 2010. b. NSTEMI 05/2014 in setting of VT. Cath impressions: "Recent IMI with occluded SVG to RCA. Has Right to Right collaterals and collaterals from septal and OM. EF 40%."  . TIA (transient ischemic attack)     ASPVD, S./P. right CEA, 2002, and known occluded left carotid  . 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 Ischemic cardiomyopathy     a. Prior EF 36%. b. 2014: 50%. c. 05/2014: EF 35-40% by echo, 40% with inf HK by cath.  . Carotid disease, bilateral     a. Right CEA 2002; known occluded left carotid. b. Duplex 06/2014: known occluded LICA, stable 5-92% RICA s/p CEA.  . Depression   . PVC's (premature ventricular  contractions)     a. PVC's with prior Holter showing PVC load of 22%.  . 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 Pneumonia 05/23/2014  . Ventricular tachycardia     a. Admitted with VT 05/2013 - EPS with inducible sustained monomorphic VT; s/p [employer] ICD 05/21/14.  . Emphysema of lung   . NSTEMI (non-ST elevation myocardial infarction) 05/2014  . Pleural effusion 05/2014  . Chronic systolic CHF (congestive heart failure)   . C. difficile colitis 06/16/2014    Past Surgical History  Procedure Laterality Date  . Coronary artery bypass graft  02/2009    3 vessel  . Colonoscopy  07/2010  . Carotidectomy  2003    right side  . Back surgery    . Electrophysiologic study  05/21/14  . Ep implantable device  05/21/14    single chamber Metronic ICD  . Left heart catheterization with coronary/graft angiogram N/A 05/19/2014    Procedure: LEFT HEART CATHETERIZATION WITH Beatrix Fetters;  Surgeon: Josue Hector, MD;  Location: [facility] CATH LAB;  Service: Cardiovascular;  Laterality: N/A;  . Electrophysiology study N/A 05/21/2014    Procedure: ELECTROPHYSIOLOGY STUDY;  Surgeon: Deboraha Sprang, MD;  Location: [facility] CATH LAB;  Service: Cardiovascular;  Laterality: N/A;    History   Social History  . Marital Status: Married    Spouse Name: N/A  . Number of Children: 2  . Years of Education: N/A   Occupational History  .      retired  landscaper   Social History Main Topics  . Smoking status: Former Smoker -- 3.00 packs/day for 45 years    Types: Cigarettes    Quit date: 08/08/1997  . Smokeless tobacco: Former Systems developer  . Alcohol Use: No  . Drug Use: No  . Sexual Activity: Not on file   Other Topics Concern  . Not on file   Social History Narrative    Family History  Problem Relation Age of Onset  . Heart disease Mother   . Alcohol abuse Father     ROS:  Recent dyspnea on exertion but no fevers or chills, productive cough, hemoptysis, dysphasia, odynophagia, melena,  hematochezia, dysuria, hematuria, rash, seizure activity, orthopnea, PND, pedal edema, claudication. Remaining systems are negative.  Physical Exam:   There were no vitals taken for this visit.  General:  Well developed/well nourished in NAD Skin warm/dry Patient not depressed No peripheral clubbing Back-normal HEENT-normal/normal eyelids Neck supple/normal carotid upstroke bilaterally; no bruits; no JVD; no thyromegaly chest - mildly diminished breath sounds left lower lobe. Previous sternotomy CV - RRR/normal S1 and S2; no murmurs, rubs or gallops;  PMI nondisplaced Abdomen -NT/ND, no HSM, no mass, + bowel sounds, no bruit 2+ femoral pulses, no bruits Ext-no edema, chords, 2+ DP Neuro-grossly nonfocal  ECG normal sinus rhythm at a rate of 67. Nonspecific ST changes. No significant change compared to July 04 2014.   Dg Chest 2 View  10/22/2014   CLINICAL DATA:  Shortness of breath.  Right pleural effusion.  EXAM: CHEST  2 VIEW  COMPARISON:  August 27, 2014.  FINDINGS: Stable cardiomediastinal silhouette. Status post coronary artery bypass graft. Left-sided pacemaker is unchanged in position. No pneumothorax is noted. Left lung is clear. Moderate right pleural effusion is noted which is increased compared to prior exam, with probable underlying atelectasis. Bony thorax appears intact.  IMPRESSION: Moderate right pleural effusion is noted which is increased compared to prior exam.   Electronically Signed   By: Marijo Conception, M.D.   On: 10/22/2014 16:46   Dg Chest Port 1 View  10/23/2014   CLINICAL DATA:  Postop thoracentesis  EXAM: PORTABLE CHEST - 1 VIEW  COMPARISON:  10/22/2014  FINDINGS: Decrease in right pleural effusion following thoracentesis. No pneumothorax. Right lower lobe atelectasis remains  Left lung is clear.  Postop CABG with cardiac enlargement. No heart failure. AICD unchanged in position.  IMPRESSION: No pneumothorax post right thoracentesis.   Electronically Signed    By: Franchot Gallo M.D.   On: 10/23/2014 09:38    Assessment/Plan 1 bradycardia-patient had an episode of hypotension and bradycardia at the time of his thoracentesis. He developed nausea, diaphoresis and dizziness. His symptoms are consistent with a vagal reaction. His blood pressure has improved and he now is asymptomatic. He has not had chest pain. I do not think further cardiac evaluation is indicated. 2 coronary artery disease-continue aspirin, Plavix and statin. 3 ischemic cardiomyopathy-continue beta blocker and ARB at preadmission dose. I would not advance this at present as he has a history of orthostatic hypotension. 4 prior ICD. 5 recurrent pleural effusion status post thoracentesis-further management per pulmonary. 6 leukemia Patient can follow-up with Dr. Caryl Comes as previously scheduled.  Kirk Ruths MD 10/23/2014, 9:47 AM

## 2014-10-24 ENCOUNTER — Other Ambulatory Visit (HOSPITAL_BASED_OUTPATIENT_CLINIC_OR_DEPARTMENT_OTHER): Payer: Commercial Managed Care - HMO

## 2014-10-24 ENCOUNTER — Telehealth: Payer: Self-pay | Admitting: *Deleted

## 2014-10-24 DIAGNOSIS — C9210 Chronic myeloid leukemia, BCR/ABL-positive, not having achieved remission: Secondary | ICD-10-CM | POA: Diagnosis not present

## 2014-10-24 LAB — COMPREHENSIVE METABOLIC PANEL (CC13)
ALT: 25 U/L (ref 0–55)
ANION GAP: 8 meq/L (ref 3–11)
AST: 19 U/L (ref 5–34)
Albumin: 3.2 g/dL — ABNORMAL LOW (ref 3.5–5.0)
Alkaline Phosphatase: 88 U/L (ref 40–150)
BUN: 9.2 mg/dL (ref 7.0–26.0)
CO2: 26 meq/L (ref 22–29)
CREATININE: 0.9 mg/dL (ref 0.7–1.3)
Calcium: 8.7 mg/dL (ref 8.4–10.4)
Chloride: 110 mEq/L — ABNORMAL HIGH (ref 98–109)
EGFR: 87 mL/min/{1.73_m2} — AB (ref >90)
GLUCOSE: 142 mg/dL — AB (ref 70–140)
Potassium: 3 mEq/L — CL (ref 3.5–5.1)
Sodium: 144 mEq/L (ref 136–145)
Total Bilirubin: 0.42 mg/dL (ref 0.20–1.20)
Total Protein: 6.2 g/dL — ABNORMAL LOW (ref 6.4–8.3)

## 2014-10-24 LAB — CBC WITH DIFFERENTIAL/PLATELET
BASO%: 0.5 % (ref 0.0–2.0)
Basophils Absolute: 0 10*3/uL (ref 0.0–0.1)
EOS%: 2.2 % (ref 0.0–7.0)
Eosinophils Absolute: 0.1 10*3/uL (ref 0.0–0.5)
HCT: 35.4 % — ABNORMAL LOW (ref 38.4–49.9)
HGB: 11.6 g/dL — ABNORMAL LOW (ref 13.0–17.1)
LYMPH#: 1.2 10*3/uL (ref 0.9–3.3)
LYMPH%: 20.5 % (ref 14.0–49.0)
MCH: 32.4 pg (ref 27.2–33.4)
MCHC: 32.8 g/dL (ref 32.0–36.0)
MCV: 98.9 fL — ABNORMAL HIGH (ref 79.3–98.0)
MONO#: 0.5 10*3/uL (ref 0.1–0.9)
MONO%: 8.7 % (ref 0.0–14.0)
NEUT#: 4.1 10*3/uL (ref 1.5–6.5)
NEUT%: 68.1 % (ref 39.0–75.0)
Platelets: 156 10*3/uL (ref 140–400)
RBC: 3.58 10*6/uL — ABNORMAL LOW (ref 4.20–5.82)
RDW: 14.1 % (ref 11.0–14.6)
WBC: 6 10*3/uL (ref 4.0–10.3)

## 2014-10-24 LAB — LACTATE DEHYDROGENASE (CC13): LDH: 108 U/L — ABNORMAL LOW (ref 125–245)

## 2014-10-24 NOTE — Telephone Encounter (Signed)
-----   Message from [name], MD sent at 10/24/2014  9:13 AM EDT ----- Regarding: low potassium Pls get him to drink more fruit juice & eat banana. I would rather not replace his potassium ----- Message -----    From: [facility] Interface    Sent: 10/24/2014   8:45 AM      To: [name], MD

## 2014-10-24 NOTE — Telephone Encounter (Signed)
Called and spoke to pt. Appt made with TP on 4/4. Pt verbalized understanding and denied any further questions or concerns at this time. Pt aware to call sooner if needed.

## 2014-10-24 NOTE — Telephone Encounter (Signed)
Instructed pt on low potassium level and instructed him on eating more foods rich in potassium.  He verbalized understanding.  He does take lasix almost daily per his cardiologist.  Routed CMET results to Dr. Caryl Comes for review.

## 2014-10-25 LAB — ADENOSINE DEAMINASE, FLUID: Adenosine Deaminase, Fluid: 7.3 U/L (ref <7.6)

## 2014-10-27 ENCOUNTER — Telehealth: Payer: Self-pay | Admitting: Family Medicine

## 2014-10-27 ENCOUNTER — Ambulatory Visit: Payer: Commercial Managed Care - HMO | Admitting: Pulmonary Disease

## 2014-10-27 LAB — BODY FLUID CULTURE
CULTURE: NO GROWTH
SPECIAL REQUESTS: NORMAL

## 2014-10-27 NOTE — Telephone Encounter (Addendum)
Patients wife calling to say that he needs a referral to a gastro doc dr Penelope Coop, for insurance reasons, appointment is tomorrow morning  [phone]

## 2014-10-28 LAB — ANA, BODY FLUID: ANTI-NUCLEAR AB, IGG: NOT DETECTED

## 2014-10-28 LAB — RHEUMATOID FACTORS, FLUID: Cortisol #1 (Base): NEGATIVE

## 2014-10-28 NOTE — Telephone Encounter (Signed)
Pt does not need a [employer] referral,referral has been submitted for Dr. Penelope Coop with 6 visits until 03/2015

## 2014-10-29 DIAGNOSIS — A047 Enterocolitis due to Clostridium difficile: Secondary | ICD-10-CM | POA: Diagnosis not present

## 2014-10-29 LAB — MISCELLANEOUS TEST

## 2014-11-03 ENCOUNTER — Ambulatory Visit (INDEPENDENT_AMBULATORY_CARE_PROVIDER_SITE_OTHER)
Admission: RE | Admit: 2014-11-03 | Discharge: 2014-11-03 | Disposition: A | Discharge status: Home / Self Care | Payer: Commercial Managed Care - HMO | Source: Ambulatory Visit | Attending: Pulmonary Disease | Admitting: Pulmonary Disease

## 2014-11-03 ENCOUNTER — Other Ambulatory Visit: Payer: Self-pay | Admitting: Pulmonary Disease

## 2014-11-03 ENCOUNTER — Encounter: Payer: Self-pay | Admitting: Pulmonary Disease

## 2014-11-03 ENCOUNTER — Telehealth: Payer: Self-pay | Admitting: Pulmonary Disease

## 2014-11-03 ENCOUNTER — Ambulatory Visit (INDEPENDENT_AMBULATORY_CARE_PROVIDER_SITE_OTHER): Payer: Commercial Managed Care - HMO | Admitting: Pulmonary Disease

## 2014-11-03 VITALS — BP 186/84 | HR 72 | Temp 98.0°F | Ht 69.0 in | Wt 187.0 lb

## 2014-11-03 DIAGNOSIS — J90 Pleural effusion, not elsewhere classified: Secondary | ICD-10-CM | POA: Diagnosis not present

## 2014-11-03 DIAGNOSIS — J9811 Atelectasis: Secondary | ICD-10-CM | POA: Diagnosis not present

## 2014-11-03 DIAGNOSIS — Z951 Presence of aortocoronary bypass graft: Secondary | ICD-10-CM | POA: Diagnosis not present

## 2014-11-03 NOTE — Progress Notes (Signed)
   Subjective:    Patient ID: Anthony Wolfe, male    DOB: 08/03/1946, 69 y.o.   MRN: [id number]  HPI The patient comes in today for an acute sick visit. He has a known history of recurrent right pleural effusion of unknown origin, and was recently tapped about 11 days ago. He feels that his fluid has come back, and is having progressive dyspnea on exertion. He is not having chest discomfort, nor is he having significant cough or purulence.   Review of Systems  Constitutional: Negative for fever and unexpected weight change.  HENT: Negative for congestion, dental problem, ear pain, nosebleeds, postnasal drip, rhinorrhea, sinus pressure, sneezing, sore throat and trouble swallowing.   Eyes: Negative for redness and itching.  Respiratory: Positive for cough, shortness of breath and wheezing. Negative for chest tightness.   Cardiovascular: Negative for palpitations and leg swelling.  Gastrointestinal: Negative for nausea and vomiting.  Genitourinary: Negative for dysuria.  Musculoskeletal: Negative for joint swelling.  Skin: Negative for rash.  Neurological: Negative for headaches.  Hematological: Does not bruise/bleed easily.  Psychiatric/Behavioral: Negative for dysphoric mood. The patient is not nervous/anxious.        Objective:   Physical Exam Well-developed male in no acute distress Nose without purulence or discharge noted Neck without lymphadenopathy or thyromegaly Chest with decreased breath sounds on the right about halfway up, as well as dullness to percussion. Cardiac exam with regular rate and rhythm Lower extremities without significant edema, no cyanosis Alert and oriented, moves all 4 extremities.       Assessment & Plan:

## 2014-11-03 NOTE — Patient Instructions (Signed)
Will send down for a chest xray today, and will call you with results in a bit.  If the fluid has come back, will arrange for removal at the hospital. Will send a note to Dr. Halford Chessman to let him know about this.  May have to get you back to Dr. Roxan Hockey to have a definitive procedure done to keep the fluid from returning.

## 2014-11-03 NOTE — Assessment & Plan Note (Signed)
The patient has a recurrent right pleural effusion of unknown origin. He had a thoracentesis of 1.4 L about 11 days ago, and now is having increasing shortness of breath and a feeling that his fluid has returned. It is unclear whether this is related to his underlying hematologic malignancy, whether it is related to his known ischemic cardiomyopathy, or possibly some undiagnosed issue. He may need a pleural biopsy and talc pleurodesis simultaneously. We'll go ahead and check a chest x-ray today and see if the fluid has reaccumulated. Has, will arrange for a thoracentesis and will also send fluid for flow cytometry. If all of this is unrevealing, I suspect he is going to need a pleural biopsy and possibly pleurodesis at the same time.

## 2014-11-03 NOTE — Telephone Encounter (Signed)
Called and spoke to pt. Pt c/o increase in SOB x a few days. Pt stated the SOB feels very similar to the time he had a pleural effusion a couple weeks ago. Pt denies cough, CP/tightness, f/c/s. Appt made with [facility] today at 1100. Pt verbalized understanding and denied any further questions or concerns at this time.

## 2014-11-04 ENCOUNTER — Ambulatory Visit: Payer: Commercial Managed Care - HMO | Admitting: Hematology and Oncology

## 2014-11-05 ENCOUNTER — Ambulatory Visit (HOSPITAL_COMMUNITY)
Admission: RE | Admit: 2014-11-05 | Discharge: 2014-11-05 | Disposition: A | Discharge status: Home / Self Care | Payer: Commercial Managed Care - HMO | Source: Ambulatory Visit | Attending: Radiology | Admitting: Radiology

## 2014-11-05 ENCOUNTER — Ambulatory Visit (HOSPITAL_COMMUNITY)
Admission: RE | Admit: 2014-11-05 | Discharge: 2014-11-05 | Disposition: A | Discharge status: Home / Self Care | Payer: Commercial Managed Care - HMO | Source: Ambulatory Visit | Attending: Interventional Radiology | Admitting: Interventional Radiology

## 2014-11-05 ENCOUNTER — Telehealth: Payer: Self-pay | Admitting: Pulmonary Disease

## 2014-11-05 DIAGNOSIS — J90 Pleural effusion, not elsewhere classified: Secondary | ICD-10-CM

## 2014-11-05 DIAGNOSIS — R091 Pleurisy: Secondary | ICD-10-CM | POA: Diagnosis not present

## 2014-11-05 LAB — GLUCOSE, SEROUS FLUID: GLUCOSE FL: 110 mg/dL

## 2014-11-05 LAB — LACTATE DEHYDROGENASE, PLEURAL OR PERITONEAL FLUID: LD, Fluid: 85 U/L — ABNORMAL HIGH (ref 3–23)

## 2014-11-05 LAB — PROTEIN, BODY FLUID: TOTAL PROTEIN, FLUID: 3.4 g/dL

## 2014-11-05 NOTE — Telephone Encounter (Signed)
Patient has appointment scheduled to see TP on 11/10/14 for f/u of Thoracentesis.  However, patient just had it done 2 days ago.  He wants to know if he needs to keep this appointment or should he wait another 2 weeks and come in then.    VS - please advise.

## 2014-11-05 NOTE — Procedures (Signed)
Successful US guided right thoracentesis. Yielded 2.1L of clear, amber colored fluid. Pt tolerated procedure well. No immediate complications.  Specimen was not sent for labs. CXR ordered.  Ascencion Dike PA-C 11/05/2014 11:33 AM

## 2014-11-06 ENCOUNTER — Ambulatory Visit (HOSPITAL_BASED_OUTPATIENT_CLINIC_OR_DEPARTMENT_OTHER): Payer: Commercial Managed Care - HMO | Admitting: Hematology and Oncology

## 2014-11-06 ENCOUNTER — Encounter: Payer: Self-pay | Admitting: Hematology and Oncology

## 2014-11-06 ENCOUNTER — Telehealth: Payer: Self-pay | Admitting: Hematology and Oncology

## 2014-11-06 VITALS — BP 145/63 | HR 76 | Temp 98.1°F | Resp 18 | Ht 69.0 in | Wt 182.9 lb

## 2014-11-06 DIAGNOSIS — C9290 Myeloid leukemia, unspecified, not having achieved remission: Secondary | ICD-10-CM

## 2014-11-06 DIAGNOSIS — J90 Pleural effusion, not elsewhere classified: Secondary | ICD-10-CM

## 2014-11-06 DIAGNOSIS — C9210 Chronic myeloid leukemia, BCR/ABL-positive, not having achieved remission: Secondary | ICD-10-CM

## 2014-11-06 DIAGNOSIS — D630 Anemia in neoplastic disease: Secondary | ICD-10-CM

## 2014-11-06 LAB — BODY FLUID CELL COUNT WITH DIFFERENTIAL
Eos, Fluid: 1 %
LYMPHS FL: 90 %
MONOCYTE-MACROPHAGE-SEROUS FLUID: 8 % — AB (ref 50–90)
NEUTROPHIL FLUID: 1 % (ref 0–25)
Total Nucleated Cell Count, Fluid: 545 cu mm (ref 0–1000)

## 2014-11-06 NOTE — Telephone Encounter (Signed)
No.  Will need ROV with me in 6 to 8 weeks.

## 2014-11-06 NOTE — Telephone Encounter (Signed)
Dr. Halford Chessman just to clarify: with this referral to [facility], does the pt need to keep his appt with TP on 11/10/14?  Thanks!

## 2014-11-06 NOTE — Assessment & Plan Note (Signed)
Clinically, he has persistent effusion on the right lung base Overall, I felt that the pleural effusion was likely related to recent pneumonia as the left lung base is clear and this is not compatible with pleural effusion associated with dasatinib, which in general should be bilaterally nature. Nevertheless, I will monitor his pulmonary function carefully while he is on treatment.

## 2014-11-06 NOTE — Progress Notes (Signed)
[facility] OFFICE PROGRESS NOTE  Patient Care Team: [name], MD as PCP - General (Family Medicine) [name], MD as Attending Physician (Cardiology) [name], MD (Family Medicine) [name], MD (Cardiothoracic Surgery) [name], MD as Attending Physician (Vascular Surgery) [name], MD as Consulting Physician (Hematology and Oncology) Deboraha Sprang, MD as Consulting Physician (Cardiology)  SUMMARY OF ONCOLOGIC HISTORY:   Chronic myelogenous leukemia   05/04/2012 Bone Marrow Biopsy BM confirmed diagnosis of CML in Chronic phase. BCR/ABL by PCR detected abnormalitis with b2a2 & b3a2 subtypes   05/09/2012 - 06/06/2013 Chemotherapy He was started on treatment with Dasatinib 100 mg daily   06/06/2012 Adverse Reaction Dose of medication was reduced to 50 mg daily due to pancytopenia   01/24/2013 Progression Patient was noted to have elevated blood count which and was subsequently found to be noncompliant to treatment. The patient has not been on treatment for several months because his prescription ran out. He was restarted back on treatment   01/16/2014 Progression Bcr/ABL by PCR is worse. Dose of Dasatinib was increased to 100 mg.   02/26/2014 Tumor Marker Blood work for ABL kinase mutation was negative.   10/29/2014 Tumor Marker BCR/ABL b2a2 & b3a2 0.29%, IS 0.1624%, not in MMR yet but improving    INTERVAL HISTORY: Please see below for problem oriented charting. Overall, he feels well. He had recurrent thoracentesis for unilateral right pleural effusion causing shortness of breath. Denies recent cough. No recent fevers or chills. Denies any diarrhea or skin bruising.  REVIEW OF SYSTEMS:   Constitutional: Denies fevers, chills or abnormal weight loss Eyes: Denies blurriness of vision Ears, nose, mouth, throat, and face: Denies mucositis or sore throat Cardiovascular: Denies palpitation, chest discomfort or lower extremity  swelling Gastrointestinal:  Denies nausea, heartburn or change in bowel habits Skin: Denies abnormal skin rashes Lymphatics: Denies new lymphadenopathy  Neurological:Denies numbness, tingling or new weaknesses Behavioral/Psych: Mood is stable, no new changes  All other systems were reviewed with the patient and are negative.  I have reviewed the past medical history, past surgical history, social history and family history with the patient and they are unchanged from previous note.  ALLERGIES:  is allergic to norvasc and sulfa antibiotics.  MEDICATIONS:  Current Outpatient Prescriptions  Medication Sig Dispense Refill  . aspirin 81 MG chewable tablet Chew 1 tablet (81 mg total) by mouth daily.    Marland Kitchen atorvastatin (LIPITOR) 80 MG tablet TAKE 1 TABLET EVERY DAY AT 6:00 PM 90 tablet 0  . carvedilol (COREG) 3.125 MG tablet Take 1 tablet (3.125 mg total) by mouth 2 (two) times daily. 180 tablet 3  . clopidogrel (PLAVIX) 75 MG tablet Take 1 tablet (75 mg total) by mouth daily. 30 tablet 0  . dasatinib (SPRYCEL) 100 MG tablet Take 100 mg by mouth daily.    . furosemide (LASIX) 20 MG tablet Take 20 mg by mouth daily as needed (swelling).    Marland Kitchen guaiFENesin (MUCINEX) 600 MG 12 hr tablet Take 1200 mg by mouth 2-3 times a day as needed for cough or to loosen phlegm    . lisinopril (PRINIVIL,ZESTRIL) 2.5 MG tablet Take 1 tablet (2.5 mg total) by mouth daily. 90 tablet 3  . nitroGLYCERIN (NITROSTAT) 0.4 MG SL tablet Place 1 tablet (0.4 mg total) under the tongue every 5 (five) minutes as needed for chest pain. 25 tablet 4  . potassium chloride SA (K-DUR,KLOR-CON) 20 MEQ tablet Take 20 mEq by mouth  daily.     No current facility-administered medications for this visit.    PHYSICAL EXAMINATION: ECOG PERFORMANCE STATUS: 1 - Symptomatic but completely ambulatory  Filed Vitals:   11/06/14 0832  BP: 145/63  Pulse: 76  Temp: 98.1 F (36.7 C)  Resp: 18   Filed Weights   11/06/14 0832  Weight: 182 lb  14.4 oz (82.963 kg)    GENERAL:alert, no distress and comfortable SKIN: skin color, texture, turgor are normal, no rashes or significant lesions EYES: normal, Conjunctiva are pink and non-injected, sclera clear OROPHARYNX:no exudate, no erythema and lips, buccal mucosa, and tongue normal  NECK: supple, thyroid normal size, non-tender, without nodularity LYMPH:  no palpable lymphadenopathy in the cervical, axillary or inguinal LUNGS: Noted right basilar crackles. Clear on auscultation on the left. HEART: regular rate & rhythm and no murmurs and no lower extremity edema ABDOMEN:abdomen soft, non-tender and normal bowel sounds Musculoskeletal:no cyanosis of digits and no clubbing  NEURO: alert & oriented x 3 with fluent speech, no focal motor/sensory deficits  LABORATORY DATA:  I have reviewed the data as listed    Component Value Date/Time   NA 144 10/24/2014 0832   NA 143 06/23/2014 1518   K 3.0* 10/24/2014 0832   K 4.2 06/23/2014 1518   CL 109 06/23/2014 1518   CL 110* 11/14/2012 0918   CO2 26 10/24/2014 0832   CO2 27 06/23/2014 1518   GLUCOSE 142* 10/24/2014 0832   GLUCOSE 109* 06/23/2014 1518   GLUCOSE 108* 11/14/2012 0918   BUN 9.2 10/24/2014 0832   BUN 13 06/23/2014 1518   CREATININE 0.9 10/24/2014 0832   CREATININE 1.3 06/23/2014 1518   CREATININE 1.16 06/19/2014 1610   CALCIUM 8.7 10/24/2014 0832   CALCIUM 8.7 06/23/2014 1518   PROT 6.2* 10/24/2014 0832   PROT 6.6 06/09/2014 1400   ALBUMIN 3.2* 10/24/2014 0832   ALBUMIN 3.3* 06/09/2014 1400   AST 19 10/24/2014 0832   AST 17 06/09/2014 1400   ALT 25 10/24/2014 0832   ALT 11 06/09/2014 1400   ALKPHOS 88 10/24/2014 0832   ALKPHOS 86 06/09/2014 1400   BILITOT 0.42 10/24/2014 0832   BILITOT 0.6 06/09/2014 1400   GFRNONAA 63 06/19/2014 1610   GFRNONAA 66* 06/10/2014 0545   GFRAA 73 06/19/2014 1610   GFRAA 77* 06/10/2014 0545    No results found for: SPEP, UPEP  Lab Results  Component Value Date   WBC 6.0  10/24/2014   NEUTROABS 4.1 10/24/2014   HGB 11.6* 10/24/2014   HCT 35.4* 10/24/2014   MCV 98.9* 10/24/2014   PLT 156 10/24/2014      Chemistry      Component Value Date/Time   NA 144 10/24/2014 0832   NA 143 06/23/2014 1518   K 3.0* 10/24/2014 0832   K 4.2 06/23/2014 1518   CL 109 06/23/2014 1518   CL 110* 11/14/2012 0918   CO2 26 10/24/2014 0832   CO2 27 06/23/2014 1518   BUN 9.2 10/24/2014 0832   BUN 13 06/23/2014 1518   CREATININE 0.9 10/24/2014 0832   CREATININE 1.3 06/23/2014 1518   CREATININE 1.16 06/19/2014 1610      Component Value Date/Time   CALCIUM 8.7 10/24/2014 0832   CALCIUM 8.7 06/23/2014 1518   ALKPHOS 88 10/24/2014 0832   ALKPHOS 86 06/09/2014 1400   AST 19 10/24/2014 0832   AST 17 06/09/2014 1400   ALT 25 10/24/2014 0832   ALT 11 06/09/2014 1400   BILITOT 0.42 10/24/2014 6945  BILITOT 0.6 06/09/2014 1400       RADIOGRAPHIC STUDIES: I have personally reviewed the radiological images as listed and agreed with the findings in the report. Dg Chest 1 View  11/05/2014   CLINICAL DATA:  Right thoracentesis.  Pleural effusion.  EXAM: CHEST  1 VIEW  COMPARISON:  11/03/2014  FINDINGS: Single lead pacer, tip projects over the right ventricle.  Prior CABG.  Reduced size of the right pleural effusion. No pneumothorax. A small effusion remains.  The left lung appears clear. Borderline enlargement of the cardiopericardial silhouette  IMPRESSION: 1. Reduced size of the right pleural effusion status post thoracentesis. No pneumothorax identified. 2. Borderline enlargement of the cardiopericardial silhouette   Electronically Signed   By: Van Clines M.D.   On: 11/05/2014 12:20   US Thoracentesis Asp Pleural Space W/img Guide  11/05/2014   CLINICAL DATA:  Recurrent right pleural effusion. Request therapeutic thoracentesis.  EXAM: ULTRASOUND GUIDED RIGHT THORACENTESIS  COMPARISON:  Previous right thoracentesis on 07/15/2014  PROCEDURE: An ultrasound guided  thoracentesis was thoroughly discussed with the patient and questions answered. The benefits, risks, alternatives and complications were also discussed. The patient understands and wishes to proceed with the procedure. Written consent was obtained.  Ultrasound was performed to localize and mark an adequate pocket of fluid in the right chest. The area was then prepped and draped in the normal sterile fashion. 1% Lidocaine was used for local anesthesia. Under ultrasound guidance a 6 French Safe-T-Centesis catheter was introduced. Thoracentesis was performed. The catheter was removed and a dressing applied.  COMPLICATIONS: None immediate.  FINDINGS: A total of approximately 2.1 L of clear, amber colored fluid was removed. A fluid sample was notsent for laboratory analysis.  IMPRESSION: Successful ultrasound guided right thoracentesis yielding 2.1 L of pleural fluid.  Read by: Ascencion Dike PA-C   Electronically Signed   By: Jerilynn Mages.  Shick M.D.   On: 11/05/2014 11:36     ASSESSMENT & PLAN:  Chronic myelogenous leukemia Thankfully, the patient has no side effects with increased dose of chemotherapy. Repeat blood work showed he has no mutation and the BCR/ABL by PCR technique is improving. I will see him back in 3 months with history, physical examination, and blood work ahead of time. I do not believe that the dasatinib is the cause of his unilateral pleural effusion. I have consult other colleagues about this. I recommend he proceed with pulmonology evaluation and for possible pleurodesis in the future.   Anemia in neoplastic disease This is likely due to recent treatment. The patient denies recent history of bleeding such as epistaxis, hematuria or hematochezia. He is asymptomatic from the anemia. I will observe for now.  He does not require transfusion now. I will continue the chemotherapy at current dose without dosage adjustment.  If the anemia gets progressive worse in the future, I might have to delay his  treatment or adjust the chemotherapy dose.    Pleural effusion, right Clinically, he has persistent effusion on the right lung base Overall, I felt that the pleural effusion was likely related to recent pneumonia as the left lung base is clear and this is not compatible with pleural effusion associated with dasatinib, which in general should be bilaterally nature. Nevertheless, I will monitor his pulmonary function carefully while he is on treatment.    Orders Placed This Encounter  Procedures  . CBC with Differential/Platelet    Standing Status: Future     Number of Occurrences:      Standing  Expiration Date: 12/11/2015  . Comprehensive metabolic panel    Standing Status: Future     Number of Occurrences:      Standing Expiration Date: 12/11/2015  . Lactate dehydrogenase    Standing Status: Future     Number of Occurrences:      Standing Expiration Date: 12/11/2015  . BCR-ABL    With RT-PCR technique    Standing Status: Future     Number of Occurrences:      Standing Expiration Date: 12/11/2015   All questions were answered. The patient knows to call the clinic with any problems, questions or concerns. No barriers to learning was detected. I spent 15 minutes counseling the patient face to face. The total time spent in the appointment was 20 minutes and more than 50% was on counseling and review of test results     [facility], [place], MD 11/06/2014 10:43 AM

## 2014-11-06 NOTE — Telephone Encounter (Signed)
Attempted to call pt and his wife. No answer, no option to leave a message. Will try back.

## 2014-11-06 NOTE — Telephone Encounter (Signed)
He needs referral to thoracic surgery to assess for pleural biopsy in setting of recurrent lymphocyte predominant, exudate, Rt pleural effusion.  Please schedule consultation with thoracic surgery >> he had CABG with Dr. Merilynn Finland before, so please set up consult with Dr. Roxan Hockey.

## 2014-11-06 NOTE — Telephone Encounter (Signed)
Gave avs & calendar for June. °

## 2014-11-06 NOTE — Assessment & Plan Note (Signed)
Thankfully, the patient has no side effects with increased dose of chemotherapy. Repeat blood work showed he has no mutation and the BCR/ABL by PCR technique is improving. I will see him back in 3 months with history, physical examination, and blood work ahead of time. I do not believe that the dasatinib is the cause of his unilateral pleural effusion. I have consult other colleagues about this. I recommend he proceed with pulmonology evaluation and for possible pleurodesis in the future.

## 2014-11-06 NOTE — Assessment & Plan Note (Signed)
This is likely due to recent treatment. The patient denies recent history of bleeding such as epistaxis, hematuria or hematochezia. He is asymptomatic from the anemia. I will observe for now.  He does not require transfusion now. I will continue the chemotherapy at current dose without dosage adjustment.  If the anemia gets progressive worse in the future, I might have to delay his treatment or adjust the chemotherapy dose.  

## 2014-11-07 NOTE — Telephone Encounter (Signed)
Called spoke with spouse who reported that VS had already spoken with them regarding the referral to Dr Roxan Hockey - advised will go ahead and place referral.  Appt cancelled with TP for 4.4.16 and rescheduled to 5.18.16 with VS.  Spouse is aware to contact the office if pt's symptoms return.  Referral placed Nothing further needed; will sign off.

## 2014-11-07 NOTE — Telephone Encounter (Signed)
Attempted to contact pt's wife again today. No answer, no option to leave a message.

## 2014-11-08 LAB — BODY FLUID CULTURE: Culture: NO GROWTH

## 2014-11-10 ENCOUNTER — Ambulatory Visit: Payer: Commercial Managed Care - HMO | Admitting: Adult Health

## 2014-11-18 ENCOUNTER — Telehealth: Payer: Self-pay | Admitting: *Deleted

## 2014-11-18 NOTE — Telephone Encounter (Signed)
Submitted [employer] referral thru acuity connect for authorization to Dr. Modesto Charon, MD with authorization [phone]  Requesting provider:Warren T. Pickard,MD  Treating provider: Sadie Haber  Number of visits:6  Start Date:11/19/14  End Date: 05/18/15  Dx:J94.8-other specified pleural conditions  Spoke to [facility] and is aware of Authorization number.  Copy has also been faxed to this office

## 2014-11-19 ENCOUNTER — Institutional Professional Consult (permissible substitution) (INDEPENDENT_AMBULATORY_CARE_PROVIDER_SITE_OTHER): Payer: Commercial Managed Care - HMO | Admitting: Thoracic Surgery (Cardiothoracic Vascular Surgery)

## 2014-11-19 ENCOUNTER — Other Ambulatory Visit: Payer: Self-pay | Admitting: *Deleted

## 2014-11-19 ENCOUNTER — Encounter: Payer: Self-pay | Admitting: Thoracic Surgery (Cardiothoracic Vascular Surgery)

## 2014-11-19 VITALS — BP 165/74 | HR 86 | Resp 18 | Ht 69.0 in | Wt 187.0 lb

## 2014-11-19 DIAGNOSIS — J948 Other specified pleural conditions: Secondary | ICD-10-CM

## 2014-11-19 DIAGNOSIS — J90 Pleural effusion, not elsewhere classified: Secondary | ICD-10-CM

## 2014-11-19 NOTE — Patient Instructions (Signed)
Stop taking Plavix after dose on Friday November 21, 2014

## 2014-11-19 NOTE — Progress Notes (Signed)
PCP is Anthony Fraction, MD Referring Provider is Anthony Mires, MD  CARDIOLOGY- Anthony Him, MD  Chief Complaint  Patient presents with  . Pleural Effusion    right....eval for bx  . Shortness of Breath    HPI: 69 yo man sent for consultation regarding a recurrent right pleural effusion.  Anthony Wolfe is a 69 year old man with a complex medical history including coronary artery disease, status post CABG in 2010, non-ST elevation MI with ventricular tachycardia in 2015, ICD placement, ischemic cardiomyopathy, congestive heart failure, bilateral carotid artery disease, hypertension, hyperlipidemia, anxiety, depression, pneumonia, COPD, and C. difficile colitis. 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  He first developed a right pleural effusion back in October 2015. This was in the setting of congestive heart failure and pneumonia. He was treated with antibiotics which improved his pneumonia but resulted in a severe case of C. difficile colitis. He said he was having difficulties with that for about 3 months before finally resolved.  He developed a right pleural effusion. He had thoracentesis in November which showed the fluid was an exudate. Cytology showed reactive mesothelial cells. His effusion recurred and he had another thoracentesis in December with the same findings. He was relatively stable until mid March when he became more short of breath. He had a repeat thoracentesis draining 1.4 L of fluid on March 17. He improved symptomatically after that drainage but noted return of symptoms and required another drainage on March 30. 2 L was withdrawn at that time. Again the fluid was exudative with cytology showing reactive mesothelial cells. He once again improved after that the repeat thoracentesis but over the past couple days has noted that his "chest is filling back up again." He has shortness of breath with exertion, a dry cough, decreased energy, he denies orthopnea.  He also complains of decreased energy and frequent urination.   Past Medical History  Diagnosis Date  . Hyperlipidemia   . HTN (hypertension)   . Allergic rhinitis   . Prostate hypertrophy   . Anxiety   . CAD (coronary artery disease)     a. s/p 3v CABG 2010. b. NSTEMI 05/2014 in setting of VT. Cath impressions: "Recent IMI with occluded SVG to RCA. Has Right to Right collaterals and collaterals from septal and OM. EF 40%."  . TIA (transient ischemic attack)     ASPVD, S./P. right CEA, 2002, and known occluded left carotid  . 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 Ischemic cardiomyopathy     a. Prior EF 36%. b. 2014: 50%. c. 05/2014: EF 35-40% by echo, 40% with inf HK by cath.  . Carotid disease, bilateral     a. Right CEA 2002; known occluded left carotid. b. Duplex 06/2014: known occluded LICA, stable 8-93% RICA s/p CEA.  . Depression   . PVC's (premature ventricular contractions)     a. PVC's with prior Holter showing PVC load of 22%.  . 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 Pneumonia 05/23/2014  . Ventricular tachycardia     a. Admitted with VT 05/2013 - EPS with inducible sustained monomorphic VT; s/p Medtronic ICD 05/21/14.  . Emphysema of lung   . NSTEMI (non-ST elevation myocardial infarction) 05/2014  . Pleural effusion 05/2014  . Chronic systolic CHF (congestive heart failure)   . C. difficile colitis 06/16/2014    Past Surgical History  Procedure Laterality Date  . Coronary artery bypass graft  02/2009    3 vessel  . Colonoscopy  07/2010  .  Carotidectomy  2003    right side  . Back surgery    . Electrophysiologic study  05/21/14  . Ep implantable device  05/21/14    single chamber Metronic ICD  . Left heart catheterization with coronary/graft angiogram N/A 05/19/2014    Procedure: LEFT HEART CATHETERIZATION WITH Beatrix Fetters;  Surgeon: Josue Hector, MD;  Location: [facility] CATH LAB;  Service: Cardiovascular;  Laterality: N/A;  .  Electrophysiology study N/A 05/21/2014    Procedure: ELECTROPHYSIOLOGY STUDY;  Surgeon: Deboraha Sprang, MD;  Location: [facility] CATH LAB;  Service: Cardiovascular;  Laterality: N/A;    Family History  Problem Relation Age of Onset  . Heart disease Mother   . Alcohol abuse Father     Social History History  Substance Use Topics  . Smoking status: Former Smoker -- 3.00 packs/day for 45 years    Types: Cigarettes    Quit date: 08/08/1997  . Smokeless tobacco: Former Systems developer  . Alcohol Use: No    Current Outpatient Prescriptions  Medication Sig Dispense Refill  . aspirin 81 MG chewable tablet Chew 1 tablet (81 mg total) by mouth daily.    Marland Kitchen atorvastatin (LIPITOR) 80 MG tablet TAKE 1 TABLET EVERY DAY AT 6:00 PM 90 tablet 0  . carvedilol (COREG) 3.125 MG tablet Take 1 tablet (3.125 mg total) by mouth 2 (two) times daily. 180 tablet 3  . clopidogrel (PLAVIX) 75 MG tablet Take 1 tablet (75 mg total) by mouth daily. 30 tablet 0  . dasatinib (SPRYCEL) 100 MG tablet Take 100 mg by mouth daily.    . furosemide (LASIX) 20 MG tablet Take 20 mg by mouth daily as needed (swelling).    Marland Kitchen guaiFENesin (MUCINEX) 600 MG 12 hr tablet Take 1200 mg by mouth 2-3 times a day as needed for cough or to loosen phlegm    . lisinopril (PRINIVIL,ZESTRIL) 5 MG tablet Take 5 mg by mouth daily.    . nitroGLYCERIN (NITROSTAT) 0.4 MG SL tablet Place 1 tablet (0.4 mg total) under the tongue every 5 (five) minutes as needed for chest pain. 25 tablet 4  . potassium chloride SA (K-DUR,KLOR-CON) 20 MEQ tablet Take 20 mEq by mouth daily.     No current facility-administered medications for this visit.    Allergies  Allergen Reactions  . Norvasc [Amlodipine Besylate] Rash    rash  . Sulfa Antibiotics Hives    Hives     Review of Systems  Constitutional: Positive for fatigue and unexpected weight change (Gained 15 pounds in 3 months).  Respiratory: Positive for cough and shortness of breath. Negative for chest tightness  and wheezing.   Cardiovascular: Positive for leg swelling (With congestive heart failure, none recently). Negative for chest pain.  Genitourinary: Positive for frequency.  Psychiatric/Behavioral: The patient is nervous/anxious.   All other systems reviewed and are negative.   BP 165/74 mmHg  Pulse 86  Resp 18  Ht 5\' 9"  (1.753 m)  Wt 187 lb (84.823 kg)  BMI 27.60 kg/m2  SpO2 96% Physical Exam  Constitutional: He is oriented to person, place, and time. He appears well-developed and well-nourished. No distress.  HENT:  Head: Normocephalic and atraumatic.  Eyes: EOM are normal. Pupils are equal, round, and reactive to light.  Neck: Neck supple. No JVD present. No tracheal deviation present.  Cardiovascular: Normal rate and regular rhythm.   No murmur (2/6 systolic) heard. Pulmonary/Chest: He has no wheezes. He has no rales.  Absent breath sounds and dullness to percussion right  base  Abdominal: Soft. There is no tenderness.  Lymphadenopathy:    He has no cervical adenopathy.  Neurological: He is alert and oriented to person, place, and time. No cranial nerve deficit.  Skin: Skin is warm and dry.  Vitals reviewed.    Diagnostic Tests: CT from November, chest x-rays and pleural fluid studies reviewed. There is a rapidly recurring exudative right pleural effusion.  Impression: 69 year old man with a recurring right pleural effusion. This is been present since October has never completely resolved. However recently it has become more difficult to manage requiring 2 large volume thoracenteses on about 2 weeks apart. He now is about 12 days from the most recent thoracentesis as becoming progressively more short of breath. He needs more definitive management for symptomatic relief.  We reviewed the chest x-rays and CT scan together. We discussed the nature of the effusion and the differential diagnosis. The effusion most likely is inflammatory in nature, however we cannot definitively  rule out malignancy or dasatinib as potential etiologies. Since the fluid is exudative it is unlikely related to congestive heart failure, and he has no other signs of volume overload.  I recommended that we proceed with right VATS for drainage of the right pleural effusion, pleural biopsies, and possible talc pleurodesis. There is a small possibility that we might need to do a decortication, although I think that is unlikely given that the lung reexpanded well after the most recent thoracentesis. I described the general nature of the procedure to Mr. and Mrs. Wolfe. I informed Wolfe of the need for general anesthesia, the incisions to be used, the expected hospital stay, and overall recovery. We discussed the success rate of the procedure and they understand there is no guarantee against a possible future recurrence. I reviewed the indications, risks benefits, and alternatives. They understand that the risk include but are not limited to death, MI, cardiac arrhythmia, stroke, DVT, PE, bleeding, possible need for transfusion, infection, recurrent pleural effusion, as well as the possibility of unforeseeable complications. They do understand he is a relatively high-risk patient due to his cardiac history in particular.  He wishes to proceed with surgery.  He will need to be off of Plavix for 5 days prior to the surgery.  He does have an ICD in place and that will need to be turned off during the procedure.  He says he was told he could never have antibiotics again because of C. difficile colitis in the difficulties he had with that. I informed Wolfe that he had to have prophylactic antibiotics around the time of surgery. We will plan to use vancomycin which has a relatively narrow spectrum and should not cause recurrence of his C. difficile.  Plan: CT of chest for chronic right pleural effusion  Hold Plavix after dose on Friday, April 15  Right VATS, drainage of right pleural effusion, pleural  biopsies, possible talc pleurodesis on Thursday, April 21.  [name], MD [facility] [phone]

## 2014-11-20 ENCOUNTER — Other Ambulatory Visit: Payer: Self-pay | Admitting: *Deleted

## 2014-11-20 DIAGNOSIS — J90 Pleural effusion, not elsewhere classified: Secondary | ICD-10-CM

## 2014-11-24 ENCOUNTER — Telehealth: Payer: Self-pay | Admitting: *Deleted

## 2014-11-24 ENCOUNTER — Other Ambulatory Visit: Payer: Self-pay | Admitting: Internal Medicine

## 2014-11-24 LAB — FUNGUS CULTURE W SMEAR: Fungal Smear: NONE SEEN

## 2014-11-24 NOTE — Telephone Encounter (Signed)
Received fax from [facility] care mgmt with authorization to [place] with authorization [id number]  Requesting provider: Modesto Charon, MD  Treating Provider: [facility]  Place of service: Imaging center  PCP: [name], MD  Type of service: CT  Procedure:71250-CT Thorax w/o dye  Number of visits:1  Start Date:11/25/14  End date: 05/24/15  Dx: J49.8-other specified pleural conditions  Copy has been sent to Dr. Roxan Hockey office and [facility]

## 2014-11-25 ENCOUNTER — Ambulatory Visit
Admission: RE | Admit: 2014-11-25 | Discharge: 2014-11-25 | Disposition: A | Discharge status: Home / Self Care | Payer: Commercial Managed Care - HMO | Source: Ambulatory Visit | Attending: Thoracic Surgery (Cardiothoracic Vascular Surgery) | Admitting: Thoracic Surgery (Cardiothoracic Vascular Surgery)

## 2014-11-25 DIAGNOSIS — J90 Pleural effusion, not elsewhere classified: Secondary | ICD-10-CM

## 2014-11-25 DIAGNOSIS — J432 Centrilobular emphysema: Secondary | ICD-10-CM | POA: Diagnosis not present

## 2014-11-25 DIAGNOSIS — R918 Other nonspecific abnormal finding of lung field: Secondary | ICD-10-CM | POA: Diagnosis not present

## 2014-11-25 NOTE — Pre-Procedure Instructions (Addendum)
Anthony Wolfe  11/25/2014   Your procedure is scheduled on:  Thursday, April 21.  Report to [facility] Admitting at [phone].  Call this number if you have problems the morning of surgery: [phone]   Remember:   Do not eat food or drink liquids after midnight.   Take these medicines the morning of surgery with A SIP OF WATER: carvedilol (COREG),  dasatinib (SPRYCEL).               Take if needed:guaiFENesin ([place]),  nitroGLYCERIN (NITROSTAT).               Stop taking Aspirin.  Stop Plavix as instructed by your doctor.   Do not wear jewelry, make-up or nail polish.  Do not wear lotions, powders, or perfumes.  Men may shave face and neck.  Do not bring valuables to the hospital.              [facility] is not responsible for any belongings or valuables.               Contacts, dentures or bridgework may not be worn into surgery.  Leave suitcase in the car. After surgery it may be brought to your room.  For patients admitted to the hospital, discharge time is determined by your treatment team.                Special Instructions: -[facility] - Preparing for Surgery  Before surgery, you can play an important role.  Because skin is not sterile, your skin needs to be as free of germs as possible.  You can reduce the number of germs on you skin by washing with CHG (chlorahexidine gluconate) soap before surgery.  CHG is an antiseptic cleaner which kills germs and bonds with the skin to continue killing germs even after washing.  Please DO NOT use if you have an allergy to CHG or antibacterial soaps.  If your skin becomes reddened/irritated stop using the CHG and inform your nurse when you arrive at Short Stay.  Do not shave (including legs and underarms) for at least 48 hours prior to the first CHG shower.  You may shave your face.  Please follow these instructions carefully:   1.  Shower with CHG Soap the night before surgery and the                                 morning of Surgery.  2.  If you choose to wash your hair, wash your hair first as usual with your       normal shampoo.  3.  After you shampoo, rinse your hair and body thoroughly to remove the                      Shampoo.  4.  Use CHG as you would any other liquid soap.  You can apply chg directly       to the skin and wash gently with scrungie or a clean washcloth.  5.  Apply the CHG Soap to your body ONLY FROM THE NECK DOWN.        Do not use on open wounds or open sores.  Avoid contact with your eyes,       ears, mouth and genitals (private parts).  Wash genitals (private parts)       with your normal soap.  6.  Wash thoroughly,  paying special attention to the area where your surgery        will be performed.  7.  Thoroughly rinse your body with warm water from the neck down.  8.  DO NOT shower/wash with your normal soap after using and rinsing off       the CHG Soap.  9.  Pat yourself dry with a clean towel.            10.  Wear clean pajamas.            11.  Place clean sheets on your bed the night of your first shower and do not        sleep with pets.  Day of Surgery  Do not apply any lotions/deoderants the morning of surgery.  Please wear clean clothes to the hospital/surgery center.     Please read over the following fact sheets that you were given: Pain Booklet, Coughing and Deep Breathing, Blood Transfusion Information and Surgical Site Infection Prevention

## 2014-11-26 ENCOUNTER — Ambulatory Visit (HOSPITAL_COMMUNITY)
Admission: RE | Admit: 2014-11-26 | Discharge: 2014-11-26 | Disposition: A | Discharge status: Home / Self Care | Payer: Commercial Managed Care - HMO | Source: Ambulatory Visit | Attending: Thoracic Surgery (Cardiothoracic Vascular Surgery) | Admitting: Thoracic Surgery (Cardiothoracic Vascular Surgery)

## 2014-11-26 ENCOUNTER — Encounter (HOSPITAL_COMMUNITY): Payer: Self-pay

## 2014-11-26 ENCOUNTER — Encounter (HOSPITAL_COMMUNITY)
Admission: RE | Admit: 2014-11-26 | Discharge: 2014-11-26 | Disposition: A | Discharge status: Home / Self Care | Payer: Commercial Managed Care - HMO | Source: Ambulatory Visit | Attending: Thoracic Surgery (Cardiothoracic Vascular Surgery) | Admitting: Thoracic Surgery (Cardiothoracic Vascular Surgery)

## 2014-11-26 VITALS — BP 160/67 | HR 83 | Temp 98.4°F | Resp 20 | Ht 69.0 in | Wt 184.1 lb

## 2014-11-26 DIAGNOSIS — I252 Old myocardial infarction: Secondary | ICD-10-CM | POA: Diagnosis not present

## 2014-11-26 DIAGNOSIS — C9210 Chronic myeloid leukemia, BCR/ABL-positive, not having achieved remission: Secondary | ICD-10-CM | POA: Diagnosis not present

## 2014-11-26 DIAGNOSIS — E876 Hypokalemia: Secondary | ICD-10-CM | POA: Diagnosis present

## 2014-11-26 DIAGNOSIS — R0602 Shortness of breath: Secondary | ICD-10-CM | POA: Diagnosis present

## 2014-11-26 DIAGNOSIS — J90 Pleural effusion, not elsewhere classified: Secondary | ICD-10-CM

## 2014-11-26 DIAGNOSIS — Z01818 Encounter for other preprocedural examination: Secondary | ICD-10-CM | POA: Diagnosis not present

## 2014-11-26 DIAGNOSIS — Z79899 Other long term (current) drug therapy: Secondary | ICD-10-CM | POA: Diagnosis not present

## 2014-11-26 DIAGNOSIS — F329 Major depressive disorder, single episode, unspecified: Secondary | ICD-10-CM | POA: Diagnosis present

## 2014-11-26 DIAGNOSIS — Z951 Presence of aortocoronary bypass graft: Secondary | ICD-10-CM | POA: Diagnosis not present

## 2014-11-26 DIAGNOSIS — Z87891 Personal history of nicotine dependence: Secondary | ICD-10-CM | POA: Diagnosis not present

## 2014-11-26 DIAGNOSIS — J939 Pneumothorax, unspecified: Secondary | ICD-10-CM | POA: Diagnosis not present

## 2014-11-26 DIAGNOSIS — Z452 Encounter for adjustment and management of vascular access device: Secondary | ICD-10-CM | POA: Diagnosis not present

## 2014-11-26 DIAGNOSIS — E46 Unspecified protein-calorie malnutrition: Secondary | ICD-10-CM | POA: Diagnosis not present

## 2014-11-26 DIAGNOSIS — J9382 Other air leak: Secondary | ICD-10-CM | POA: Diagnosis not present

## 2014-11-26 DIAGNOSIS — Z8673 Personal history of transient ischemic attack (TIA), and cerebral infarction without residual deficits: Secondary | ICD-10-CM | POA: Diagnosis not present

## 2014-11-26 DIAGNOSIS — J449 Chronic obstructive pulmonary disease, unspecified: Secondary | ICD-10-CM | POA: Diagnosis present

## 2014-11-26 DIAGNOSIS — R091 Pleurisy: Secondary | ICD-10-CM | POA: Diagnosis not present

## 2014-11-26 DIAGNOSIS — F419 Anxiety disorder, unspecified: Secondary | ICD-10-CM | POA: Diagnosis present

## 2014-11-26 DIAGNOSIS — Z9889 Other specified postprocedural states: Secondary | ICD-10-CM | POA: Diagnosis not present

## 2014-11-26 DIAGNOSIS — Z7902 Long term (current) use of antithrombotics/antiplatelets: Secondary | ICD-10-CM | POA: Diagnosis not present

## 2014-11-26 DIAGNOSIS — I517 Cardiomegaly: Secondary | ICD-10-CM

## 2014-11-26 DIAGNOSIS — J95811 Postprocedural pneumothorax: Secondary | ICD-10-CM | POA: Diagnosis not present

## 2014-11-26 DIAGNOSIS — I2510 Atherosclerotic heart disease of native coronary artery without angina pectoris: Secondary | ICD-10-CM | POA: Diagnosis present

## 2014-11-26 DIAGNOSIS — J9811 Atelectasis: Secondary | ICD-10-CM | POA: Diagnosis not present

## 2014-11-26 DIAGNOSIS — Z95810 Presence of automatic (implantable) cardiac defibrillator: Secondary | ICD-10-CM | POA: Diagnosis not present

## 2014-11-26 DIAGNOSIS — Z01810 Encounter for preprocedural cardiovascular examination: Secondary | ICD-10-CM | POA: Diagnosis not present

## 2014-11-26 DIAGNOSIS — E785 Hyperlipidemia, unspecified: Secondary | ICD-10-CM | POA: Diagnosis present

## 2014-11-26 DIAGNOSIS — Z950 Presence of cardiac pacemaker: Secondary | ICD-10-CM | POA: Diagnosis not present

## 2014-11-26 DIAGNOSIS — I10 Essential (primary) hypertension: Secondary | ICD-10-CM | POA: Diagnosis present

## 2014-11-26 DIAGNOSIS — I5022 Chronic systolic (congestive) heart failure: Secondary | ICD-10-CM | POA: Diagnosis not present

## 2014-11-26 DIAGNOSIS — Z01812 Encounter for preprocedural laboratory examination: Secondary | ICD-10-CM | POA: Diagnosis not present

## 2014-11-26 DIAGNOSIS — R918 Other nonspecific abnormal finding of lung field: Secondary | ICD-10-CM | POA: Diagnosis not present

## 2014-11-26 DIAGNOSIS — Z4682 Encounter for fitting and adjustment of non-vascular catheter: Secondary | ICD-10-CM | POA: Diagnosis not present

## 2014-11-26 DIAGNOSIS — J986 Disorders of diaphragm: Secondary | ICD-10-CM | POA: Diagnosis not present

## 2014-11-26 DIAGNOSIS — I255 Ischemic cardiomyopathy: Secondary | ICD-10-CM | POA: Diagnosis present

## 2014-11-26 HISTORY — DX: Presence of cardiac pacemaker: Z95.0

## 2014-11-26 HISTORY — DX: Cerebral infarction, unspecified: I63.9

## 2014-11-26 HISTORY — DX: Disease of blood and blood-forming organs, unspecified: D75.9

## 2014-11-26 HISTORY — DX: Presence of automatic (implantable) cardiac defibrillator: Z95.810

## 2014-11-26 HISTORY — DX: Reserved for inherently not codable concepts without codable children: IMO0001

## 2014-11-26 LAB — COMPREHENSIVE METABOLIC PANEL
ALBUMIN: 3.3 g/dL — AB (ref 3.5–5.2)
ALT: 21 U/L (ref 0–53)
AST: 23 U/L (ref 0–37)
Alkaline Phosphatase: 82 U/L (ref 39–117)
Anion gap: 8 (ref 5–15)
BUN: 7 mg/dL (ref 6–23)
CHLORIDE: 111 mmol/L (ref 96–112)
CO2: 21 mmol/L (ref 19–32)
Calcium: 8.5 mg/dL (ref 8.4–10.5)
Creatinine, Ser: 0.92 mg/dL (ref 0.50–1.35)
GFR calc Af Amer: >90 mL/min (ref >90)
GFR calc non Af Amer: 83 mL/min — ABNORMAL LOW (ref >90)
Glucose, Bld: 117 mg/dL — ABNORMAL HIGH (ref 70–99)
Potassium: 3.4 mmol/L — ABNORMAL LOW (ref 3.5–5.1)
SODIUM: 140 mmol/L (ref 135–145)
Total Bilirubin: 0.5 mg/dL (ref 0.3–1.2)
Total Protein: 6.1 g/dL (ref 6.0–8.3)

## 2014-11-26 LAB — URINALYSIS, ROUTINE W REFLEX MICROSCOPIC
Bilirubin Urine: NEGATIVE
Glucose, UA: NEGATIVE mg/dL
Hgb urine dipstick: NEGATIVE
Ketones, ur: NEGATIVE mg/dL
Leukocytes, UA: NEGATIVE
Nitrite: NEGATIVE
PROTEIN: NEGATIVE mg/dL
Specific Gravity, Urine: 1.024 (ref 1.005–1.030)
Urobilinogen, UA: 0.2 mg/dL (ref 0.0–1.0)
pH: 5 (ref 5.0–8.0)

## 2014-11-26 LAB — BLOOD GAS, ARTERIAL
ACID-BASE DEFICIT: 2 mmol/L (ref 0.0–2.0)
Bicarbonate: 21.9 mEq/L (ref 20.0–24.0)
DRAWN BY: 42180
O2 SAT: 97 %
PCO2 ART: 35 mmHg (ref 35.0–45.0)
Patient temperature: 98.6
TCO2: 22.9 mmol/L (ref 0–100)
pH, Arterial: 7.412 (ref 7.350–7.450)
pO2, Arterial: 84.1 mmHg (ref 80.0–100.0)

## 2014-11-26 LAB — PROTIME-INR
INR: 1.25 (ref 0.00–1.49)
Prothrombin Time: 15.9 seconds — ABNORMAL HIGH (ref 11.6–15.2)

## 2014-11-26 LAB — TYPE AND SCREEN
ABO/RH(D): A POS
Antibody Screen: NEGATIVE

## 2014-11-26 LAB — CBC
HEMATOCRIT: 34.8 % — AB (ref 39.0–52.0)
Hemoglobin: 11.5 g/dL — ABNORMAL LOW (ref 13.0–17.0)
MCH: 32.2 pg (ref 26.0–34.0)
MCHC: 33 g/dL (ref 30.0–36.0)
MCV: 97.5 fL (ref 78.0–100.0)
Platelets: 141 10*3/uL — ABNORMAL LOW (ref 150–400)
RBC: 3.57 MIL/uL — ABNORMAL LOW (ref 4.22–5.81)
RDW: 14.1 % (ref 11.5–15.5)
WBC: 6.2 10*3/uL (ref 4.0–10.5)

## 2014-11-26 LAB — SURGICAL PCR SCREEN
MRSA, PCR: NEGATIVE
STAPHYLOCOCCUS AUREUS: NEGATIVE

## 2014-11-26 LAB — APTT: aPTT: 30 seconds (ref 24–37)

## 2014-11-26 MED ORDER — VANCOMYCIN HCL IN DEXTROSE 1-5 GM/200ML-% IV SOLN
1000.0000 mg | INTRAVENOUS | Status: AC
  Administered 2014-11-27: 1000 mg via INTRAVENOUS
  Filled 2014-11-26: qty 200

## 2014-11-26 NOTE — Progress Notes (Signed)
Anesthesia Chart Review:  Patient is a 69 year old male scheduled for right VATS, drainage of right pleural effusion, pleural biopsy, possible talc pleurodesis on 11/27/14 by Dr. Roxan Hockey.  DX: Recurrent right pleural effusion (previous cytology showed reactive mesothelial cells).  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 PCP is Dr. Dennard Schaumann. Cardiologist is Dr. Radford Pax. EP cardiologist is Dr. Caryl Comes. Last visit 09/15/14.   Pulmonologist is Dr. Halford Chessman. ONC is Dr. Alvy Bimler.  Last cardiology visit was with Dr. Stanford Breed on 10/23/14 for bradycardia following a thoracentesis and diagnosed with vagal reaction.  11/26/14 EKG: SR with first degree AVB, occasional PVC, septal infarct (age undetermined). Incomplete LBBB pattern. PVC is new, but overall I think his EKG appears stable since last tracing  10/23/14.  05/19/14 Cardiac cath: Coronary Arteries: Right dominant with no anomalies LM: Long segment 50% calcified mid vessel disease LAD: 100% mid vessel occlusion distal vessel fills from LIMA D1: 30% mid vessel disease large artery D2 Small artery no focal stentois Circumflex: Occluded proximally  OM1: Fills via SVG OM OM2: Fills via SVG OM RCA: Subtotally occluded proximally with bridging collaterals PDA: Seen from OM collaterals PLA: Seen from OM collaterals Ventriculography: EF: 40 %, No significant MR Inferior wall hypokinesis  Hemodynamics: Aortic Pressure: 132 54 mmHg LV Pressure: 135 8 mmHg Impression: Recent IMI with occluded SVG to RCA Troponin 11 Has Right to Right collaterals and collaterals from septal and OM EF on V gram actually looks Closer to 40% Medical Rx.  Will ask EP to see given VT. Suspect he will have to wait for re evaluation of EF in 3 months to make decision about AICD. Would be a candidate for life vest(Dr. Jenkins Rouge).   05/17/14 Echo: Normal LV size and systolic function, EF 48-54%. Global mild hypokinesis with severe inferior hypokinesis. Normal RV size withmildly decreased systolic function. No significant valvularabnormalities.  07/08/14 Carotid duplex: Heterogenous plaque RICA. Stable 1-39% RICA stenosis s/p CEA with DPA. Known LICA occlusion. Patent vertebral arteries with antegrade flow. Normal subclavian arteries, bilaterally. F/U 2 years.   11/26/14 CXR:  IMPRESSION: 1. Stable right pleural effusion and right base atelectasis and/or infiltrate. 2. Stable cardiomegaly. Prior CABG. Cardiac pacer in stable position.  Preoperative labs noted.   Meds include ASA, Lipitor, Coreg, Plavix, Lasix, lisinopril, Nitro, KCl, dasatinib. Dr. Roxan Hockey has instructed Plavix to be held for five days preoperatively. He is planning to use vancomycin pre-operatively, particularly with his C. Difficile colitis last year.  Further evaluation by his anesthesiologist on the day of surgery.  If no acute changes then I would anticipate that he could proceed as planned. ICD RX is pending, but for this procedure the Medtronic rep will need to reprogram device and re-evaluate post-operatively.  George Hugh [facility] Short Stay Center/Anesthesiology Phone [phone] 11/26/2014 12:39 PM

## 2014-11-27 ENCOUNTER — Encounter (HOSPITAL_COMMUNITY): Payer: Self-pay | Admitting: *Deleted

## 2014-11-27 ENCOUNTER — Inpatient Hospital Stay (HOSPITAL_COMMUNITY): Payer: Commercial Managed Care - HMO | Admitting: Vascular Surgery

## 2014-11-27 ENCOUNTER — Inpatient Hospital Stay (HOSPITAL_COMMUNITY): Payer: Commercial Managed Care - HMO

## 2014-11-27 ENCOUNTER — Inpatient Hospital Stay (HOSPITAL_COMMUNITY): Payer: Commercial Managed Care - HMO | Admitting: Anesthesiology

## 2014-11-27 ENCOUNTER — Encounter (HOSPITAL_COMMUNITY)
Admission: RE | Disposition: A | Discharge status: Home / Self Care | Payer: Self-pay | Source: Ambulatory Visit | Attending: Thoracic Surgery (Cardiothoracic Vascular Surgery)

## 2014-11-27 ENCOUNTER — Inpatient Hospital Stay (HOSPITAL_COMMUNITY)
Admission: RE | Admit: 2014-11-27 | Discharge: 2014-12-08 | DRG: 264 | Disposition: A | Discharge status: Home / Self Care | Payer: Commercial Managed Care - HMO | Source: Ambulatory Visit | Attending: Thoracic Surgery (Cardiothoracic Vascular Surgery) | Admitting: Thoracic Surgery (Cardiothoracic Vascular Surgery)

## 2014-11-27 DIAGNOSIS — I255 Ischemic cardiomyopathy: Secondary | ICD-10-CM | POA: Diagnosis present

## 2014-11-27 DIAGNOSIS — Z95810 Presence of automatic (implantable) cardiac defibrillator: Secondary | ICD-10-CM

## 2014-11-27 DIAGNOSIS — I5022 Chronic systolic (congestive) heart failure: Principal | ICD-10-CM | POA: Diagnosis present

## 2014-11-27 DIAGNOSIS — I2510 Atherosclerotic heart disease of native coronary artery without angina pectoris: Secondary | ICD-10-CM | POA: Diagnosis present

## 2014-11-27 DIAGNOSIS — F329 Major depressive disorder, single episode, unspecified: Secondary | ICD-10-CM | POA: Diagnosis present

## 2014-11-27 DIAGNOSIS — J90 Pleural effusion, not elsewhere classified: Secondary | ICD-10-CM

## 2014-11-27 DIAGNOSIS — E876 Hypokalemia: Secondary | ICD-10-CM | POA: Diagnosis present

## 2014-11-27 DIAGNOSIS — Z7902 Long term (current) use of antithrombotics/antiplatelets: Secondary | ICD-10-CM | POA: Diagnosis not present

## 2014-11-27 DIAGNOSIS — Z09 Encounter for follow-up examination after completed treatment for conditions other than malignant neoplasm: Secondary | ICD-10-CM

## 2014-11-27 DIAGNOSIS — Z01812 Encounter for preprocedural laboratory examination: Secondary | ICD-10-CM

## 2014-11-27 DIAGNOSIS — Z87891 Personal history of nicotine dependence: Secondary | ICD-10-CM

## 2014-11-27 DIAGNOSIS — C9210 Chronic myeloid leukemia, BCR/ABL-positive, not having achieved remission: Secondary | ICD-10-CM | POA: Diagnosis present

## 2014-11-27 DIAGNOSIS — Z4682 Encounter for fitting and adjustment of non-vascular catheter: Secondary | ICD-10-CM

## 2014-11-27 DIAGNOSIS — I10 Essential (primary) hypertension: Secondary | ICD-10-CM | POA: Diagnosis present

## 2014-11-27 DIAGNOSIS — Z01818 Encounter for other preprocedural examination: Secondary | ICD-10-CM

## 2014-11-27 DIAGNOSIS — J939 Pneumothorax, unspecified: Secondary | ICD-10-CM | POA: Diagnosis not present

## 2014-11-27 DIAGNOSIS — J9382 Other air leak: Secondary | ICD-10-CM | POA: Diagnosis not present

## 2014-11-27 DIAGNOSIS — F419 Anxiety disorder, unspecified: Secondary | ICD-10-CM | POA: Diagnosis present

## 2014-11-27 DIAGNOSIS — E785 Hyperlipidemia, unspecified: Secondary | ICD-10-CM | POA: Diagnosis present

## 2014-11-27 DIAGNOSIS — Z951 Presence of aortocoronary bypass graft: Secondary | ICD-10-CM

## 2014-11-27 DIAGNOSIS — Z01810 Encounter for preprocedural cardiovascular examination: Secondary | ICD-10-CM

## 2014-11-27 DIAGNOSIS — R0602 Shortness of breath: Secondary | ICD-10-CM | POA: Diagnosis present

## 2014-11-27 DIAGNOSIS — Z8673 Personal history of transient ischemic attack (TIA), and cerebral infarction without residual deficits: Secondary | ICD-10-CM

## 2014-11-27 DIAGNOSIS — E46 Unspecified protein-calorie malnutrition: Secondary | ICD-10-CM | POA: Diagnosis present

## 2014-11-27 DIAGNOSIS — Z79899 Other long term (current) drug therapy: Secondary | ICD-10-CM

## 2014-11-27 DIAGNOSIS — I252 Old myocardial infarction: Secondary | ICD-10-CM | POA: Diagnosis not present

## 2014-11-27 DIAGNOSIS — J449 Chronic obstructive pulmonary disease, unspecified: Secondary | ICD-10-CM | POA: Diagnosis present

## 2014-11-27 HISTORY — PX: VIDEO ASSISTED THORACOSCOPY: SHX5073

## 2014-11-27 HISTORY — PX: TALC PLEURODESIS: SHX2506

## 2014-11-27 HISTORY — PX: PLEURAL EFFUSION DRAINAGE: SHX5099

## 2014-11-27 HISTORY — PX: PLEURAL BIOPSY: SHX5082

## 2014-11-27 LAB — GLUCOSE, SEROUS FLUID: GLUCOSE FL: 102 mg/dL

## 2014-11-27 LAB — LACTATE DEHYDROGENASE, PLEURAL OR PERITONEAL FLUID: LD FL: 74 U/L — AB (ref 3–23)

## 2014-11-27 LAB — BODY FLUID CELL COUNT WITH DIFFERENTIAL
LYMPHS FL: 89 %
Monocyte-Macrophage-Serous Fluid: 9 % — ABNORMAL LOW (ref 50–90)
Neutrophil Count, Fluid: 2 % (ref 0–25)
WBC FLUID: 986 uL (ref 0–1000)

## 2014-11-27 LAB — PROTEIN, BODY FLUID: TOTAL PROTEIN, FLUID: 3.7 g/dL

## 2014-11-27 SURGERY — VIDEO ASSISTED THORACOSCOPY
Anesthesia: General | Site: Chest | Laterality: Right

## 2014-11-27 MED ORDER — HYDROMORPHONE HCL 1 MG/ML IJ SOLN
0.2500 mg | INTRAMUSCULAR | Status: DC | PRN
  Administered 2014-11-27 (×4): 0.5 mg via INTRAVENOUS

## 2014-11-27 MED ORDER — PROPOFOL 10 MG/ML IV BOLUS
INTRAVENOUS | Status: AC
  Filled 2014-11-27: qty 20

## 2014-11-27 MED ORDER — DEXTROSE 5 % IV SOLN
1.5000 g | Freq: Two times a day (BID) | INTRAVENOUS | Status: AC
  Administered 2014-11-27 – 2014-11-28 (×2): 1.5 g via INTRAVENOUS
  Filled 2014-11-27 (×2): qty 1.5

## 2014-11-27 MED ORDER — ARTIFICIAL TEARS OP OINT
TOPICAL_OINTMENT | OPHTHALMIC | Status: AC
  Filled 2014-11-27: qty 3.5

## 2014-11-27 MED ORDER — OXYCODONE HCL 5 MG PO TABS
ORAL_TABLET | ORAL | Status: AC
  Administered 2014-11-27: 5 mg
  Filled 2014-11-27: qty 1

## 2014-11-27 MED ORDER — ASPIRIN 81 MG PO CHEW
81.0000 mg | CHEWABLE_TABLET | Freq: Every day | ORAL | Status: DC
  Administered 2014-11-28 – 2014-12-08 (×11): 81 mg via ORAL
  Filled 2014-11-27 (×11): qty 1

## 2014-11-27 MED ORDER — DIPHENHYDRAMINE HCL 12.5 MG/5ML PO ELIX
12.5000 mg | ORAL_SOLUTION | Freq: Four times a day (QID) | ORAL | Status: DC | PRN
  Filled 2014-11-27: qty 5

## 2014-11-27 MED ORDER — ONDANSETRON HCL 4 MG/2ML IJ SOLN
INTRAMUSCULAR | Status: AC
  Filled 2014-11-27: qty 2

## 2014-11-27 MED ORDER — ROCURONIUM BROMIDE 50 MG/5ML IV SOLN
INTRAVENOUS | Status: AC
  Filled 2014-11-27: qty 1

## 2014-11-27 MED ORDER — DEXAMETHASONE SODIUM PHOSPHATE 4 MG/ML IJ SOLN
INTRAMUSCULAR | Status: AC
  Filled 2014-11-27: qty 1

## 2014-11-27 MED ORDER — NALOXONE HCL 0.4 MG/ML IJ SOLN: 0.4000 mg | INTRAMUSCULAR | Status: DC | PRN

## 2014-11-27 MED ORDER — SODIUM CHLORIDE 0.9 % IJ SOLN
INTRAMUSCULAR | Status: AC
  Filled 2014-11-27: qty 10

## 2014-11-27 MED ORDER — ROCURONIUM BROMIDE 100 MG/10ML IV SOLN
INTRAVENOUS | Status: DC | PRN
  Administered 2014-11-27: 50 mg via INTRAVENOUS

## 2014-11-27 MED ORDER — MIDAZOLAM HCL 2 MG/2ML IJ SOLN
INTRAMUSCULAR | Status: AC
  Filled 2014-11-27: qty 2

## 2014-11-27 MED ORDER — CLOPIDOGREL BISULFATE 75 MG PO TABS
75.0000 mg | ORAL_TABLET | Freq: Every day | ORAL | Status: DC
  Administered 2014-11-28 – 2014-12-08 (×11): 75 mg via ORAL
  Filled 2014-11-27 (×11): qty 1

## 2014-11-27 MED ORDER — ACETAMINOPHEN 160 MG/5ML PO SOLN
1000.0000 mg | Freq: Four times a day (QID) | ORAL | Status: AC
  Filled 2014-11-27 (×20): qty 40

## 2014-11-27 MED ORDER — KCL IN DEXTROSE-NACL 20-5-0.45 MEQ/L-%-% IV SOLN
INTRAVENOUS | Status: DC
  Administered 2014-11-27 – 2014-11-29 (×2): via INTRAVENOUS
  Filled 2014-11-27 (×11): qty 1000

## 2014-11-27 MED ORDER — SODIUM CHLORIDE 0.9 % IV BOLUS (SEPSIS)
500.0000 mL | Freq: Once | INTRAVENOUS | Status: AC
  Administered 2014-11-27: 500 mL via INTRAVENOUS

## 2014-11-27 MED ORDER — OXYCODONE HCL 5 MG PO TABS
5.0000 mg | ORAL_TABLET | ORAL | Status: DC | PRN
  Administered 2014-12-02 – 2014-12-06 (×2): 10 mg via ORAL
  Filled 2014-11-27 (×2): qty 2

## 2014-11-27 MED ORDER — SENNOSIDES-DOCUSATE SODIUM 8.6-50 MG PO TABS
1.0000 | ORAL_TABLET | Freq: Every day | ORAL | Status: DC
  Administered 2014-11-27 – 2014-12-05 (×3): 1 via ORAL
  Filled 2014-11-27 (×12): qty 1

## 2014-11-27 MED ORDER — BISACODYL 5 MG PO TBEC
10.0000 mg | DELAYED_RELEASE_TABLET | Freq: Every day | ORAL | Status: DC
  Administered 2014-11-28 – 2014-12-04 (×4): 10 mg via ORAL
  Filled 2014-11-27 (×5): qty 2

## 2014-11-27 MED ORDER — TALC 5 G PL SUSR
INTRAPLEURAL | Status: AC
  Filled 2014-11-27: qty 10

## 2014-11-27 MED ORDER — DEXAMETHASONE SODIUM PHOSPHATE 4 MG/ML IJ SOLN
INTRAMUSCULAR | Status: DC | PRN
  Administered 2014-11-27: 4 mg via INTRAVENOUS

## 2014-11-27 MED ORDER — ATORVASTATIN CALCIUM 80 MG PO TABS
80.0000 mg | ORAL_TABLET | Freq: Every day | ORAL | Status: DC
  Administered 2014-11-27 – 2014-12-07 (×11): 80 mg via ORAL
  Filled 2014-11-27 (×12): qty 1

## 2014-11-27 MED ORDER — CARVEDILOL 3.125 MG PO TABS
3.1250 mg | ORAL_TABLET | Freq: Two times a day (BID) | ORAL | Status: DC
  Administered 2014-11-27 – 2014-12-06 (×18): 3.125 mg via ORAL
  Filled 2014-11-27 (×22): qty 1

## 2014-11-27 MED ORDER — LIDOCAINE HCL (CARDIAC) 20 MG/ML IV SOLN
INTRAVENOUS | Status: AC
  Filled 2014-11-27: qty 5

## 2014-11-27 MED ORDER — NEOSTIGMINE METHYLSULFATE 10 MG/10ML IV SOLN
INTRAVENOUS | Status: AC
  Filled 2014-11-27: qty 1

## 2014-11-27 MED ORDER — POTASSIUM CHLORIDE 10 MEQ/50ML IV SOLN
10.0000 meq | Freq: Every day | INTRAVENOUS | Status: DC | PRN
  Administered 2014-12-06 (×3): 10 meq via INTRAVENOUS
  Filled 2014-11-27 (×4): qty 50

## 2014-11-27 MED ORDER — TRAMADOL HCL 50 MG PO TABS
50.0000 mg | ORAL_TABLET | Freq: Four times a day (QID) | ORAL | Status: DC | PRN
  Administered 2014-12-01 – 2014-12-03 (×4): 100 mg via ORAL
  Filled 2014-11-27 (×4): qty 2

## 2014-11-27 MED ORDER — GLYCOPYRROLATE 0.2 MG/ML IJ SOLN
INTRAMUSCULAR | Status: DC | PRN
  Administered 2014-11-27: 0.6 mg via INTRAVENOUS

## 2014-11-27 MED ORDER — ONDANSETRON HCL 4 MG/2ML IJ SOLN
INTRAMUSCULAR | Status: DC | PRN
  Administered 2014-11-27: 4 mg via INTRAVENOUS

## 2014-11-27 MED ORDER — PHENYLEPHRINE 40 MCG/ML (10ML) SYRINGE FOR IV PUSH (FOR BLOOD PRESSURE SUPPORT)
PREFILLED_SYRINGE | INTRAVENOUS | Status: AC
  Filled 2014-11-27: qty 10

## 2014-11-27 MED ORDER — SUCCINYLCHOLINE CHLORIDE 20 MG/ML IJ SOLN
INTRAMUSCULAR | Status: AC
  Filled 2014-11-27: qty 1

## 2014-11-27 MED ORDER — LACTATED RINGERS IV SOLN
INTRAVENOUS | Status: DC | PRN
  Administered 2014-11-27: 07:00:00 via INTRAVENOUS

## 2014-11-27 MED ORDER — ONDANSETRON HCL 4 MG/2ML IJ SOLN
4.0000 mg | Freq: Four times a day (QID) | INTRAMUSCULAR | Status: DC | PRN
Start: 2014-11-27 — End: 2014-12-08
  Administered 2014-12-01 – 2014-12-03 (×4): 4 mg via INTRAVENOUS
  Filled 2014-11-27 (×4): qty 2

## 2014-11-27 MED ORDER — HYDROMORPHONE HCL 1 MG/ML IJ SOLN
INTRAMUSCULAR | Status: AC
  Filled 2014-11-27: qty 2

## 2014-11-27 MED ORDER — MIDAZOLAM HCL 5 MG/5ML IJ SOLN
INTRAMUSCULAR | Status: DC | PRN
  Administered 2014-11-27 (×2): 1 mg via INTRAVENOUS

## 2014-11-27 MED ORDER — DASATINIB 100 MG PO TABS
100.0000 mg | ORAL_TABLET | Freq: Every day | ORAL | Status: DC
Start: 2014-11-28 — End: 2014-12-08
  Administered 2014-11-28 – 2014-12-08 (×10): 100 mg via ORAL
  Filled 2014-11-27 (×3): qty 1

## 2014-11-27 MED ORDER — PROPOFOL 10 MG/ML IV BOLUS
INTRAVENOUS | Status: DC | PRN
  Administered 2014-11-27: 110 mg via INTRAVENOUS

## 2014-11-27 MED ORDER — NEOSTIGMINE METHYLSULFATE 10 MG/10ML IV SOLN
INTRAVENOUS | Status: DC | PRN
  Administered 2014-11-27: 4 mg via INTRAVENOUS

## 2014-11-27 MED ORDER — PHENYLEPHRINE HCL 10 MG/ML IJ SOLN
10.0000 mg | INTRAVENOUS | Status: DC | PRN
  Administered 2014-11-27: 25 ug/min via INTRAVENOUS

## 2014-11-27 MED ORDER — EPHEDRINE SULFATE 50 MG/ML IJ SOLN
INTRAMUSCULAR | Status: AC
  Filled 2014-11-27: qty 1

## 2014-11-27 MED ORDER — TALC 5 G PL SUSR
INTRAPLEURAL | Status: DC | PRN
  Administered 2014-11-27: 4 g via INTRAPLEURAL

## 2014-11-27 MED ORDER — SODIUM CHLORIDE 0.9 % IJ SOLN: 9.0000 mL | INTRAMUSCULAR | Status: DC | PRN

## 2014-11-27 MED ORDER — ACETAMINOPHEN 500 MG PO TABS
1000.0000 mg | ORAL_TABLET | Freq: Four times a day (QID) | ORAL | Status: AC
  Administered 2014-11-27 – 2014-12-02 (×17): 1000 mg via ORAL
  Filled 2014-11-27 (×21): qty 2

## 2014-11-27 MED ORDER — FENTANYL CITRATE (PF) 250 MCG/5ML IJ SOLN
INTRAMUSCULAR | Status: AC
Start: 2014-11-27 — End: 2014-11-27
  Filled 2014-11-27: qty 5

## 2014-11-27 MED ORDER — FENTANYL 10 MCG/ML IV SOLN
INTRAVENOUS | Status: DC
  Administered 2014-11-27: 150 ug via INTRAVENOUS
  Administered 2014-11-27 (×2): via INTRAVENOUS
  Administered 2014-11-27: 170 ug via INTRAVENOUS
  Administered 2014-11-28: 60 ug via INTRAVENOUS
  Administered 2014-11-28: 110 ug via INTRAVENOUS
  Administered 2014-11-28: 40 ug via INTRAVENOUS
  Administered 2014-11-28: 50 ug via INTRAVENOUS
  Administered 2014-11-28: 240 ug via INTRAVENOUS
  Administered 2014-11-29: 8 ug via INTRAVENOUS
  Administered 2014-11-29 (×2): 40 ug via INTRAVENOUS
  Administered 2014-11-29: 30 ug via INTRAVENOUS
  Administered 2014-11-29: 90 ug via INTRAVENOUS
  Administered 2014-11-29: 05:00:00 via INTRAVENOUS
  Administered 2014-11-30 (×2): 10 ug via INTRAVENOUS
  Administered 2014-11-30: 20 ug via INTRAVENOUS
  Administered 2014-11-30: 40 ug via INTRAVENOUS
  Administered 2014-11-30: 110 ug via INTRAVENOUS
  Administered 2014-12-01: 60 ug via INTRAVENOUS
  Administered 2014-12-01: 30 ug via INTRAVENOUS
  Filled 2014-11-27 (×5): qty 50

## 2014-11-27 MED ORDER — GLYCOPYRROLATE 0.2 MG/ML IJ SOLN
INTRAMUSCULAR | Status: AC
  Filled 2014-11-27: qty 3

## 2014-11-27 MED ORDER — FENTANYL CITRATE (PF) 100 MCG/2ML IJ SOLN
INTRAMUSCULAR | Status: DC | PRN
  Administered 2014-11-27 (×5): 25 ug via INTRAVENOUS
  Administered 2014-11-27: 50 ug via INTRAVENOUS
  Administered 2014-11-27: 75 ug via INTRAVENOUS

## 2014-11-27 MED ORDER — LISINOPRIL 5 MG PO TABS
5.0000 mg | ORAL_TABLET | Freq: Every day | ORAL | Status: DC
  Filled 2014-11-27: qty 1

## 2014-11-27 MED ORDER — DIPHENHYDRAMINE HCL 50 MG/ML IJ SOLN: 12.5000 mg | Freq: Four times a day (QID) | INTRAMUSCULAR | Status: DC | PRN

## 2014-11-27 MED ORDER — ONDANSETRON HCL 4 MG/2ML IJ SOLN
4.0000 mg | Freq: Four times a day (QID) | INTRAMUSCULAR | Status: DC | PRN
  Administered 2014-11-29: 4 mg via INTRAVENOUS
  Filled 2014-11-27: qty 2

## 2014-11-27 SURGICAL SUPPLY — 70 items
APPLIER CLIP ROT 10 11.4 M/L (STAPLE)
CANISTER SUCTION 2500CC (MISCELLANEOUS) ×5 IMPLANT
CATH KIT ON Q 5IN SLV (PAIN MANAGEMENT) IMPLANT
CATH THORACIC 28FR (CATHETERS) ×5 IMPLANT
CATH THORACIC 28FR RT ANG (CATHETERS) IMPLANT
CATH THORACIC 36FR (CATHETERS) IMPLANT
CATH THORACIC 36FR RT ANG (CATHETERS) IMPLANT
CLIP APPLIE ROT 10 11.4 M/L (STAPLE) IMPLANT
CLIP TI MEDIUM 6 (CLIP) IMPLANT
CONN Y 3/8X3/8X3/8  BEN (MISCELLANEOUS) ×2
CONN Y 3/8X3/8X3/8 BEN (MISCELLANEOUS) ×3 IMPLANT
CONT SPEC 4OZ CLIKSEAL STRL BL (MISCELLANEOUS) ×10 IMPLANT
COVER SURGICAL LIGHT HANDLE (MISCELLANEOUS) ×5 IMPLANT
DERMABOND ADVANCED (GAUZE/BANDAGES/DRESSINGS) ×2
DERMABOND ADVANCED .7 DNX12 (GAUZE/BANDAGES/DRESSINGS) ×3 IMPLANT
DRAIN CHANNEL 28F RND 3/8 FF (WOUND CARE) ×5 IMPLANT
DRAPE LAPAROSCOPIC ABDOMINAL (DRAPES) ×5 IMPLANT
DRAPE WARM FLUID 44X44 (DRAPE) ×5 IMPLANT
ELECT BLADE 6.5 EXT (BLADE) ×5 IMPLANT
ELECT REM PT RETURN 9FT ADLT (ELECTROSURGICAL) ×5
ELECTRODE REM PT RTRN 9FT ADLT (ELECTROSURGICAL) ×3 IMPLANT
GAUZE SPONGE 4X4 12PLY STRL (GAUZE/BANDAGES/DRESSINGS) ×5 IMPLANT
GLOVE SURG SIGNA 7.5 PF LTX (GLOVE) ×10 IMPLANT
GOWN STRL REUS W/ TWL LRG LVL3 (GOWN DISPOSABLE) ×6 IMPLANT
GOWN STRL REUS W/ TWL XL LVL3 (GOWN DISPOSABLE) ×3 IMPLANT
GOWN STRL REUS W/TWL LRG LVL3 (GOWN DISPOSABLE) ×4
GOWN STRL REUS W/TWL XL LVL3 (GOWN DISPOSABLE) ×2
HEMOSTAT SURGICEL 2X14 (HEMOSTASIS) IMPLANT
KIT BASIN OR (CUSTOM PROCEDURE TRAY) ×5 IMPLANT
KIT ROOM TURNOVER OR (KITS) ×5 IMPLANT
KIT SUCTION CATH 14FR (SUCTIONS) ×5 IMPLANT
NS IRRIG 1000ML POUR BTL (IV SOLUTION) ×10 IMPLANT
PACK CHEST (CUSTOM PROCEDURE TRAY) ×5 IMPLANT
PAD ARMBOARD 7.5X6 YLW CONV (MISCELLANEOUS) ×10 IMPLANT
POUCH ENDO CATCH II 15MM (MISCELLANEOUS) IMPLANT
POUCH SPECIMEN RETRIEVAL 10MM (ENDOMECHANICALS) IMPLANT
RELOAD EGIA 60 MED/THCK PURPLE (STAPLE) ×5 IMPLANT
SEALANT PROGEL (MISCELLANEOUS) IMPLANT
SEALANT SURG COSEAL 4ML (VASCULAR PRODUCTS) IMPLANT
SEALANT SURG COSEAL 8ML (VASCULAR PRODUCTS) IMPLANT
SOLUTION ANTI FOG 6CC (MISCELLANEOUS) ×5 IMPLANT
SPECIMEN JAR MEDIUM (MISCELLANEOUS) ×5 IMPLANT
SPONGE GAUZE 4X4 12PLY STER LF (GAUZE/BANDAGES/DRESSINGS) ×5 IMPLANT
SPONGE INTESTINAL PEANUT (DISPOSABLE) IMPLANT
SUT PROLENE 4 0 RB 1 (SUTURE)
SUT PROLENE 4-0 RB1 .5 CRCL 36 (SUTURE) IMPLANT
SUT SILK  1 MH (SUTURE) ×4
SUT SILK 1 MH (SUTURE) ×6 IMPLANT
SUT SILK 2 0SH CR/8 30 (SUTURE) IMPLANT
SUT SILK 3 0SH CR/8 30 (SUTURE) IMPLANT
SUT VIC AB 1 CTX 36 (SUTURE)
SUT VIC AB 1 CTX36XBRD ANBCTR (SUTURE) IMPLANT
SUT VIC AB 2-0 CTX 36 (SUTURE) IMPLANT
SUT VIC AB 2-0 UR6 27 (SUTURE) IMPLANT
SUT VIC AB 3-0 MH 27 (SUTURE) IMPLANT
SUT VIC AB 3-0 X1 27 (SUTURE) ×5 IMPLANT
SUT VICRYL 2 TP 1 (SUTURE) IMPLANT
SWAB COLLECTION DEVICE MRSA (MISCELLANEOUS) IMPLANT
SYSTEM SAHARA CHEST DRAIN ATS (WOUND CARE) ×5 IMPLANT
TAPE CLOTH 4X10 WHT NS (GAUZE/BANDAGES/DRESSINGS) ×5 IMPLANT
TAPE CLOTH SURG 4X10 WHT LF (GAUZE/BANDAGES/DRESSINGS) ×5 IMPLANT
TIP APPLICATOR SPRAY EXTEND 16 (VASCULAR PRODUCTS) IMPLANT
TOWEL OR 17X24 6PK STRL BLUE (TOWEL DISPOSABLE) ×5 IMPLANT
TOWEL OR 17X26 10 PK STRL BLUE (TOWEL DISPOSABLE) ×10 IMPLANT
TRAP SPECIMEN MUCOUS 40CC (MISCELLANEOUS) IMPLANT
TRAY FOLEY CATH 16FRSI W/METER (SET/KITS/TRAYS/PACK) ×5 IMPLANT
TROCAR XCEL NON-BLD 5MMX100MML (ENDOMECHANICALS) IMPLANT
TUBE ANAEROBIC SPECIMEN COL (MISCELLANEOUS) IMPLANT
TUNNELER SHEATH ON-Q 11GX8 DSP (PAIN MANAGEMENT) IMPLANT
WATER STERILE IRR 1000ML POUR (IV SOLUTION) ×10 IMPLANT

## 2014-11-27 NOTE — Progress Notes (Signed)
Lunch relief by M. Lovena Le RN

## 2014-11-27 NOTE — Brief Op Note (Addendum)
11/27/2014  9:44 AM  PATIENT:  Meade Maw  69 y.o. male  PRE-OPERATIVE DIAGNOSIS:  RECURRENT RIGHT PLEURAL EFFUSION  POST-OPERATIVE DIAGNOSIS:  RECURRENT RIGHT PLEURAL EFFUSION  PROCEDURE:   RIGHT VIDEO ASSISTED THORACOSCOPY  DRAINAGE OF PLEURAL EFFUSION  PLEURAL BIOPSY  TALC PLEURODESIS  BIOPSY OF [place]  Lung biopsy RLL  SURGEON:  Surgeon(s): [name], MD  ASSISTANT: Suzzanne Cloud, PA-C  ANESTHESIA:   general  SPECIMEN:  Source of Specimen:  pleural fluid, pleural biopsies, diaphragm biopsy  DISPOSITION OF SPECIMEN:  Pathology  DRAINS: 28 Fr Blake drain and 28 Fr CT  FINDINGS: around 2L serous fluid drained from right pleural space  PATIENT CONDITION:  PACU - hemodynamically stable.   2 liters of murky yellow fluid. Infiltrative appearance of parietal pleura. Enlarged LN along inferior border of RLL

## 2014-11-27 NOTE — Progress Notes (Signed)
AICD rep Sharee Pimple here and turned device back on.

## 2014-11-27 NOTE — Progress Notes (Signed)
Spoke with [employer] Rep. Tomi Bamberger, who stated someone will be here. (aware case is [phone] and needs turned off).

## 2014-11-27 NOTE — Progress Notes (Signed)
Dr. Servando Snare return page. New orders received for bolus. Will continue to monitor pt.

## 2014-11-27 NOTE — Anesthesia Procedure Notes (Addendum)
Procedure Name: Intubation Date/Time: 11/27/2014 8:25 AM Performed by: Susa Loffler Pre-anesthesia Checklist: Patient identified, Timeout performed, Emergency Drugs available, Suction available and Patient being monitored Patient Re-evaluated:Patient Re-evaluated prior to inductionOxygen Delivery Method: Circle system utilized Preoxygenation: Pre-oxygenation with 100% oxygen Intubation Type: IV induction Ventilation: Mask ventilation without difficulty and Oral airway inserted - appropriate to patient size Laryngoscope Size: Mac and 3 Grade View: Grade I Tube type: Oral Endobronchial tube: Left, Double lumen EBT and EBT position confirmed by fiberoptic bronchoscope and 39 Fr Number of attempts: 1 Airway Equipment and Method: Stylet,  Fiberoptic brochoscope and Oral airway Placement Confirmation: ETT inserted through vocal cords under direct vision,  positive ETCO2 and breath sounds checked- equal and bilateral Secured at: 31 cm Tube secured with: Tape Dental Injury: Teeth and Oropharynx as per pre-operative assessment       CVP

## 2014-11-27 NOTE — Anesthesia Postprocedure Evaluation (Signed)
Anesthesia Post Note  Patient: Anthony Wolfe  Procedure(s) Performed: Procedure(s) (LRB): RIGHT VIDEO ASSISTED THORACOSCOPY (Right) DRAINAGE OF PLEURAL EFFUSION (Right) PLEURAL BIOPSY (N/A) TALC PLEURADESIS (Right)  Anesthesia type: general  Patient location: PACU  Post pain: Pain level controlled  Post assessment: Patient's Cardiovascular Status Stable  Last Vitals:  Filed Vitals:   11/27/14 1622  BP:   Pulse:   Temp:   Resp: 26    Post vital signs: Reviewed and stable  Level of consciousness: sedated  Complications: No apparent anesthesia complications

## 2014-11-27 NOTE — Progress Notes (Signed)
Paged Dr. Servando Snare regarding pts urinary output. Awaiting call back.

## 2014-11-27 NOTE — Transfer of Care (Signed)
Immediate Anesthesia Transfer of Care Note  Patient: Anthony Wolfe  Procedure(s) Performed: Procedure(s): RIGHT VIDEO ASSISTED THORACOSCOPY (Right) DRAINAGE OF PLEURAL EFFUSION (Right) PLEURAL BIOPSY (N/A) TALC PLEURADESIS (Right)  Patient Location: PACU  Anesthesia Type:General  Level of Consciousness: awake, alert  and oriented  Airway & Oxygen Therapy: Patient Spontanous Breathing and Patient connected to face mask oxygen  Post-op Assessment: Report given to RN and Post -op Vital signs reviewed and stable  Post vital signs: Reviewed and stable  Last Vitals:  Filed Vitals:   11/27/14 0600  BP: 180/82  Pulse:   Temp:   Resp:     Complications: No apparent anesthesia complications

## 2014-11-27 NOTE — H&P (View-Only) (Signed)
PCP is Odette Fraction, MD Referring Provider is Chesley Mires, MD  CARDIOLOGY- Fransico Him, MD  Chief Complaint  Patient presents with  . Pleural Effusion    right....eval for bx  . Shortness of Breath    HPI: 69 yo man sent for consultation regarding a recurrent right pleural effusion.  Anthony Wolfe is a 69 year old man with a complex medical history including coronary artery disease, status post CABG in 2010, non-ST elevation MI with ventricular tachycardia in 2015, ICD placement, ischemic cardiomyopathy, congestive heart failure, bilateral carotid artery disease, hypertension, hyperlipidemia, anxiety, depression, pneumonia, COPD, and C. difficile colitis. 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  He first developed a right pleural effusion back in October 2015. This was in the setting of congestive heart failure and pneumonia. He was treated with antibiotics which improved his pneumonia but resulted in a severe case of C. difficile colitis. He said he was having difficulties with that for about 3 months before finally resolved.  He developed a right pleural effusion. He had thoracentesis in November which showed the fluid was an exudate. Cytology showed reactive mesothelial cells. His effusion recurred and he had another thoracentesis in December with the same findings. He was relatively stable until mid March when he became more short of breath. He had a repeat thoracentesis draining 1.4 L of fluid on March 17. He improved symptomatically after that drainage but noted return of symptoms and required another drainage on March 30. 2 L was withdrawn at that time. Again the fluid was exudative with cytology showing reactive mesothelial cells. He once again improved after that the repeat thoracentesis but over the past couple days has noted that his "chest is filling back up again." He has shortness of breath with exertion, a dry cough, decreased energy, he denies orthopnea.  He also complains of decreased energy and frequent urination.   Past Medical History  Diagnosis Date  . Hyperlipidemia   . HTN (hypertension)   . Allergic rhinitis   . Prostate hypertrophy   . Anxiety   . CAD (coronary artery disease)     a. s/p 3v CABG 2010. b. NSTEMI 05/2014 in setting of VT. Cath impressions: "Recent IMI with occluded SVG to RCA. Has Right to Right collaterals and collaterals from septal and OM. EF 40%."  . TIA (transient ischemic attack)     ASPVD, S./P. right CEA, 2002, and known occluded left carotid  . 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 Ischemic cardiomyopathy     a. Prior EF 36%. b. 2014: 50%. c. 05/2014: EF 35-40% by echo, 40% with inf HK by cath.  . Carotid disease, bilateral     a. Right CEA 2002; known occluded left carotid. b. Duplex 06/2014: known occluded LICA, stable 9-39% RICA s/p CEA.  . Depression   . PVC's (premature ventricular contractions)     a. PVC's with prior Holter showing PVC load of 22%.  . 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 Pneumonia 05/23/2014  . Ventricular tachycardia     a. Admitted with VT 05/2013 - EPS with inducible sustained monomorphic VT; s/p [employer] ICD 05/21/14.  . Emphysema of lung   . NSTEMI (non-ST elevation myocardial infarction) 05/2014  . Pleural effusion 05/2014  . Chronic systolic CHF (congestive heart failure)   . C. difficile colitis 06/16/2014    Past Surgical History  Procedure Laterality Date  . Coronary artery bypass graft  02/2009    3 vessel  . Colonoscopy  07/2010  .  Carotidectomy  2003    right side  . Back surgery    . Electrophysiologic study  05/21/14  . Ep implantable device  05/21/14    single chamber Metronic ICD  . Left heart catheterization with coronary/graft angiogram N/A 05/19/2014    Procedure: LEFT HEART CATHETERIZATION WITH Beatrix Fetters;  Surgeon: Josue Hector, MD;  Location: [facility] CATH LAB;  Service: Cardiovascular;  Laterality: N/A;  .  Electrophysiology study N/A 05/21/2014    Procedure: ELECTROPHYSIOLOGY STUDY;  Surgeon: Deboraha Sprang, MD;  Location: [facility] CATH LAB;  Service: Cardiovascular;  Laterality: N/A;    Family History  Problem Relation Age of Onset  . Heart disease Mother   . Alcohol abuse Father     Social History History  Substance Use Topics  . Smoking status: Former Smoker -- 3.00 packs/day for 45 years    Types: Cigarettes    Quit date: 08/08/1997  . Smokeless tobacco: Former Systems developer  . Alcohol Use: No    Current Outpatient Prescriptions  Medication Sig Dispense Refill  . aspirin 81 MG chewable tablet Chew 1 tablet (81 mg total) by mouth daily.    Marland Kitchen atorvastatin (LIPITOR) 80 MG tablet TAKE 1 TABLET EVERY DAY AT 6:00 PM 90 tablet 0  . carvedilol (COREG) 3.125 MG tablet Take 1 tablet (3.125 mg total) by mouth 2 (two) times daily. 180 tablet 3  . clopidogrel (PLAVIX) 75 MG tablet Take 1 tablet (75 mg total) by mouth daily. 30 tablet 0  . dasatinib (SPRYCEL) 100 MG tablet Take 100 mg by mouth daily.    . furosemide (LASIX) 20 MG tablet Take 20 mg by mouth daily as needed (swelling).    Marland Kitchen guaiFENesin (MUCINEX) 600 MG 12 hr tablet Take 1200 mg by mouth 2-3 times a day as needed for cough or to loosen phlegm    . lisinopril (PRINIVIL,ZESTRIL) 5 MG tablet Take 5 mg by mouth daily.    . nitroGLYCERIN (NITROSTAT) 0.4 MG SL tablet Place 1 tablet (0.4 mg total) under the tongue every 5 (five) minutes as needed for chest pain. 25 tablet 4  . potassium chloride SA (K-DUR,KLOR-CON) 20 MEQ tablet Take 20 mEq by mouth daily.     No current facility-administered medications for this visit.    Allergies  Allergen Reactions  . Norvasc [Amlodipine Besylate] Rash    rash  . Sulfa Antibiotics Hives    Hives     Review of Systems  Constitutional: Positive for fatigue and unexpected weight change (Gained 15 pounds in 3 months).  Respiratory: Positive for cough and shortness of breath. Negative for chest tightness  and wheezing.   Cardiovascular: Positive for leg swelling (With congestive heart failure, none recently). Negative for chest pain.  Genitourinary: Positive for frequency.  Psychiatric/Behavioral: The patient is nervous/anxious.   All other systems reviewed and are negative.   BP 165/74 mmHg  Pulse 86  Resp 18  Ht 5\' 9"  (1.753 m)  Wt 187 lb (84.823 kg)  BMI 27.60 kg/m2  SpO2 96% Physical Exam  Constitutional: He is oriented to person, place, and time. He appears well-developed and well-nourished. No distress.  HENT:  Head: Normocephalic and atraumatic.  Eyes: EOM are normal. Pupils are equal, round, and reactive to light.  Neck: Neck supple. No JVD present. No tracheal deviation present.  Cardiovascular: Normal rate and regular rhythm.   No murmur (2/6 systolic) heard. Pulmonary/Chest: He has no wheezes. He has no rales.  Absent breath sounds and dullness to percussion right  base  Abdominal: Soft. There is no tenderness.  Lymphadenopathy:    He has no cervical adenopathy.  Neurological: He is alert and oriented to person, place, and time. No cranial nerve deficit.  Skin: Skin is warm and dry.  Vitals reviewed.    Diagnostic Tests: CT from November, chest x-rays and pleural fluid studies reviewed. There is a rapidly recurring exudative right pleural effusion.  Impression: 69 year old man with a recurring right pleural effusion. This is been present since October has never completely resolved. However recently it has become more difficult to manage requiring 2 large volume thoracenteses on about 2 weeks apart. He now is about 12 days from the most recent thoracentesis as becoming progressively more short of breath. He needs more definitive management for symptomatic relief.  We reviewed the chest x-rays and CT scan together. We discussed the nature of the effusion and the differential diagnosis. The effusion most likely is inflammatory in nature, however we cannot definitively  rule out malignancy or dasatinib as potential etiologies. Since the fluid is exudative it is unlikely related to congestive heart failure, and he has no other signs of volume overload.  I recommended that we proceed with right VATS for drainage of the right pleural effusion, pleural biopsies, and possible talc pleurodesis. There is a small possibility that we might need to do a decortication, although I think that is unlikely given that the lung reexpanded well after the most recent thoracentesis. I described the general nature of the procedure to Anthony Wolfe. I informed him of the need for general anesthesia, the incisions to be used, the expected hospital stay, and overall recovery. We discussed the success rate of the procedure and they understand there is no guarantee against a possible future recurrence. I reviewed the indications, risks benefits, and alternatives. They understand that the risk include but are not limited to death, MI, cardiac arrhythmia, stroke, DVT, PE, bleeding, possible need for transfusion, infection, recurrent pleural effusion, as well as the possibility of unforeseeable complications. They do understand he is a relatively high-risk patient due to his cardiac history in particular.  He wishes to proceed with surgery.  He will need to be off of Plavix for 5 days prior to the surgery.  He does have an ICD in place and that will need to be turned off during the procedure.  He says he was told he could never have antibiotics again because of C. difficile colitis in the difficulties he had with that. I informed him that he had to have prophylactic antibiotics around the time of surgery. We will plan to use vancomycin which has a relatively narrow spectrum and should not cause recurrence of his C. difficile.  Plan: CT of chest for chronic right pleural effusion  Hold Plavix after dose on Friday, April 15  Right VATS, drainage of right pleural effusion, pleural  biopsies, possible talc pleurodesis on Thursday, April 21.  [name], MD [facility] [phone]

## 2014-11-27 NOTE — Interval H&P Note (Signed)
History and Physical Interval Note:  11/27/2014 8:05 AM  Anthony Wolfe  has presented today for surgery, with the diagnosis of RECURRENT RIGHT PLEURAL EFFUSION  The various methods of treatment have been discussed with the patient and family. After consideration of risks, benefits and other options for treatment, the patient has consented to  Procedure(s): RIGHT VIDEO ASSISTED THORACOSCOPY (Right) DRAINAGE OF PLEURAL EFFUSION (Right) PLEURAL BIOPSY (N/A) POSSIBLE TALC PLEURADESIS (Right) as a surgical intervention .  The patient's history has been reviewed, patient examined, no change in status, stable for surgery.  I have reviewed the patient's chart and labs.  Questions were answered to the patient's satisfaction.     [name]

## 2014-11-27 NOTE — Op Note (Signed)
NAME:  Anthony Wolfe, TANNEY NO.:  [id number]  MEDICAL RECORD NO.:  [id number]  LOCATION:  3S06C                        FACILITY:  [facility]  PHYSICIAN:  Revonda Standard. Roxan Hockey, M.D.DATE OF BIRTH:  05-10-1946  DATE OF PROCEDURE:  11/27/2014 DATE OF DISCHARGE:                              OPERATIVE REPORT   PREOPERATIVE DIAGNOSIS:  Recurrent right pleural effusion.  POSTOPERATIVE DIAGNOSIS:  Recurrent right pleural effusion.  PROCEDURE:   Right video-assisted thoracoscopy Drainage of pleural effusion Pleural biopsy Diaphragm biopsy Lung biopsy Talc pleurodesis.  SURGEON:  Revonda Standard. Roxan Hockey, M.D.  ASSISTANT:  Suzzanne Cloud, P.A.  ANESTHESIA:  General.  FINDINGS:  2 L of murky yellow fluid.  No entrapment of lung. Infiltrative and inflamed appearance of parietal pleura.  A small nodule along diaphragm appeared fatty when biopsied.  Enlarged lymph node along the inferior margin of the right lower lobe.  CLINICAL NOTE:  Anthony Wolfe is a 69 year old man with a complex medical history including coronary artery disease, ischemic cardiomyopathy, congestive heart failure, pneumonia, COPD, and chronic myelogenous leukemia.  He developed a right pleural effusion back in October of 2015, and he has had problems with recurrent effusion since then.  He was advised to undergo right video assisted thoracoscopy for drainage, pleural biopsies, and possible talc pleurodesis.  The indications, risks, benefits, and alternatives were discussed in detail with the patient.  He understood and accepted the risks and agreed to proceed.  OPERATIVE NOTE:  Mr. Verno was brought to the operating room on November 27, 2014.  The Anesthesia Service had placed a central line and arterial blood pressure monitoring line in the preoperative holding area.  He was anesthetized and intubated with a double-lumen endotracheal tube. Intravenous antibiotics were administered.  A Foley catheter was  placed. Sequential compressive devices were placed on the calves for DVT prophylaxis.  He was placed in a left lateral decubitus position, and the right chest was prepped and draped in usual sterile fashion.  Single lung ventilation of the left lung was initiated and was tolerated well throughout the procedure.  After confirming the operative site, an incision was made in approximately the seventh intercostal space in the midaxillary line.  It was carried through the skin and subcutaneous tissue.  The chest was entered with a 5-mm port, and the thoracoscope was advanced in the chest.  There was good isolation of the right lung. There was a large pleural effusion.  A small working incision, 4 cm in Length, was made in the fifth intercostal space anterolaterally.  No rib spreading was performed during the procedure.  The pleural effusion was evacuated.  A portion of the fluid was sent for AFB, fungal, and bacterial cultures.  A second portion of the specimen was sent for protein, glucose, and LDH.  The remainder of the fluid, which was approximately 2 L in total, was sent for cytology.  The lung was inspected. There was an enlarged lymph node on the inferior border of the right lower lobe.  The lung was not entrapped.  There were no obvious lung nodules or masses. There was an enlarged lymph node on the inferior margin of the lower lobe. The parietal  pleura was inspected, and there was an infiltrate of appearance to the parietal pleura, but no tumor nodules were seen.  Along the diaphragm, there was a small nodule, this was biopsied and when biopsied appeared fatty in nature. It was sent for permanent pathology.  The area in the lower portion of the parietal pleura along the lateral chest wall was biopsied and sent for frozen section.  Additional portions of the parietal pleura were taken and sent for permanent pathology.  While awaiting the results of the frozen section, the inferior  ligament was divided.  The inferior ligament lymph node was removed and sent for permanent pathology. It was normal in appearance.  The decision was made to go ahead and biopsy the enlarged lymph node along the inferior border of the right lower lobe.  This was done with a single firing of a 60 mm stapler.  This specimen was also sent for permanent pathology.  The frozen section of the pleura came back showing chronic inflammation.  No malignancy was seen.  The decision was made to go ahead with talc pleurodesis.  4 g of talc was insufflated into the chest.  There was good distribution of the talc.  A 28-French Blake drain was then placed through the original port incision and directed along the diaphragm and up the posterior gutter.  A second port incision was made and a 28-French chest tube was placed through this incision and directed to the apex.  Both were secured with #1 silk sutures.  The right lung was reinflated, and there was good expansion of all 3 lobes. The utility incision was closed with a #1 Vicryl fascial suture followed by 2-0 Vicryl subcutaneous suture and a 3-0 Vicryl subcuticular suture. All sponge, needle, and instrument counts were correct at the end of the procedure.  The patient was taken from the operating room to the postanesthetic care unit in good condition.     Revonda Standard Roxan Hockey, M.D.     SCH/MEDQ  D:  11/27/2014  T:  11/27/2014  Job:  [id number]

## 2014-11-27 NOTE — Anesthesia Preprocedure Evaluation (Addendum)
Anesthesia Evaluation  Patient identified by MRN, date of birth, ID band Patient awake    Reviewed: Allergy & Precautions, H&P , NPO status , Patient's Chart, lab work & pertinent test results, reviewed documented beta blocker date and time   Airway Mallampati: I  TM Distance: >3 FB Neck ROM: Full    Dental no notable dental hx. (+) Edentulous Upper, Edentulous Lower, Dental Advisory Given   Pulmonary COPDformer smoker,  breath sounds clear to auscultation  Pulmonary exam normal       Cardiovascular hypertension, Pt. on medications and Pt. on home beta blockers + CAD, + Past MI, + Peripheral Vascular Disease and +CHF + pacemaker + Cardiac Defibrillator Rhythm:Regular Rate:Normal     Neuro/Psych Anxiety Depression CVA    GI/Hepatic negative GI ROS, Neg liver ROS,   Endo/Other  negative endocrine ROS  Renal/GU negative Renal ROS  negative genitourinary   Musculoskeletal   Abdominal   Peds  Hematology negative hematology ROS (+) anemia ,   Anesthesia Other Findings   Reproductive/Obstetrics negative OB ROS                            Anesthesia Physical Anesthesia Plan  ASA: III  Anesthesia Plan: General   Post-op Pain Management:    Induction: Intravenous  Airway Management Planned: Double Lumen EBT  Additional Equipment: Arterial line, CVP and Ultrasound Guidance Line Placement  Intra-op Plan:   Post-operative Plan: Extubation in OR and Possible Post-op intubation/ventilation  Informed Consent: I have reviewed the patients History and Physical, chart, labs and discussed the procedure including the risks, benefits and alternatives for the proposed anesthesia with the patient or authorized representative who has indicated his/her understanding and acceptance.   Dental advisory given  Plan Discussed with: CRNA  Anesthesia Plan Comments:         Anesthesia Quick  Evaluation

## 2014-11-28 ENCOUNTER — Inpatient Hospital Stay (HOSPITAL_COMMUNITY): Payer: Commercial Managed Care - HMO

## 2014-11-28 LAB — CBC
HCT: 32.1 % — ABNORMAL LOW (ref 39.0–52.0)
Hemoglobin: 10.7 g/dL — ABNORMAL LOW (ref 13.0–17.0)
MCH: 32.9 pg (ref 26.0–34.0)
MCHC: 33.3 g/dL (ref 30.0–36.0)
MCV: 98.8 fL (ref 78.0–100.0)
Platelets: 178 10*3/uL (ref 150–400)
RBC: 3.25 MIL/uL — ABNORMAL LOW (ref 4.22–5.81)
RDW: 13.9 % (ref 11.5–15.5)
WBC: 8.9 10*3/uL (ref 4.0–10.5)

## 2014-11-28 LAB — BLOOD GAS, ARTERIAL
ACID-BASE DEFICIT: 1.7 mmol/L (ref 0.0–2.0)
Bicarbonate: 22.4 mEq/L (ref 20.0–24.0)
DRAWN BY: 422461
O2 Content: 2 L/min
O2 Saturation: 95.1 %
PCO2 ART: 36.1 mmHg (ref 35.0–45.0)
PO2 ART: 72.1 mmHg — AB (ref 80.0–100.0)
Patient temperature: 97.6
TCO2: 23.5 mmol/L (ref 0–100)
pH, Arterial: 7.406 (ref 7.350–7.450)

## 2014-11-28 LAB — BASIC METABOLIC PANEL
Anion gap: 9 (ref 5–15)
BUN: 12 mg/dL (ref 6–23)
CHLORIDE: 105 mmol/L (ref 96–112)
CO2: 22 mmol/L (ref 19–32)
Calcium: 7.9 mg/dL — ABNORMAL LOW (ref 8.4–10.5)
Creatinine, Ser: 1.12 mg/dL (ref 0.50–1.35)
GFR calc Af Amer: 75 mL/min — ABNORMAL LOW (ref >90)
GFR, EST NON AFRICAN AMERICAN: 65 mL/min — AB (ref >90)
GLUCOSE: 129 mg/dL — AB (ref 70–99)
POTASSIUM: 4.4 mmol/L (ref 3.5–5.1)
Sodium: 136 mmol/L (ref 135–145)

## 2014-11-28 MED ORDER — ENOXAPARIN SODIUM 40 MG/0.4ML ~~LOC~~ SOLN
40.0000 mg | SUBCUTANEOUS | Status: DC
  Administered 2014-11-28 – 2014-12-07 (×10): 40 mg via SUBCUTANEOUS
  Filled 2014-11-28 (×11): qty 0.4

## 2014-11-28 NOTE — Progress Notes (Addendum)
[address]       [place] [address]             [phone]          1 Day Post-Op Procedure(s) (LRB): RIGHT VIDEO ASSISTED THORACOSCOPY (Right) DRAINAGE OF PLEURAL EFFUSION (Right) PLEURAL BIOPSY (N/A) TALC PLEURADESIS (Right)  Subjective: OOB in chair. Walked in hall already this am. Breathing improved, sats >90% on RA.   Objective: Vital signs in last 24 hours: Patient Vitals for the past 24 hrs:  BP Temp Temp src Pulse Resp SpO2 Height Weight  11/28/14 0752 - - - - (!) 25 95 % - -  11/28/14 0750 - - - - (!) 25 94 % - -  11/28/14 0743 - 98.1 F (36.7 C) Oral - - - - -  11/28/14 0400 - - - 69 17 96 % - -  11/28/14 0337 93/65 mmHg 97.6 F (36.4 C) Oral 73 20 96 % - -  11/27/14 2340 (!) 116/56 mmHg 98 F (36.7 C) Oral 75 19 96 % - -  11/27/14 2000 (!) 99/55 mmHg 98.7 F (37.1 C) Oral 85 20 96 % - -  11/27/14 1750 106/62 mmHg - - 95 - - - -  11/27/14 1622 - - - - (!) 26 95 % - -  11/27/14 1538 - - - - (!) 21 93 % - -  11/27/14 1500 - 98.8 F (37.1 C) Oral - - - - -  11/27/14 1400 - - - - - - 5\' 9"  (1.753 m) 184 lb (83.462 kg)  11/27/14 1351 111/63 mmHg 98.6 F (37 C) Oral 88 (!) 21 93 % - -  11/27/14 1330 - - - 88 - 95 % - -  11/27/14 1326 115/62 mmHg - - - (!) 24 - - -  11/27/14 1315 - - - 84 (!) 23 94 % - -  11/27/14 1312 - 98.3 F (36.8 C) - - - - - -  11/27/14 1300 - - - 92 (!) 21 94 % - -  11/27/14 1256 114/61 mmHg - - - - - - -  11/27/14 1245 - - - 85 (!) 21 95 % - -  11/27/14 1230 - - - 78 16 98 % - -  11/27/14 1225 109/69 mmHg - - 79 19 98 % - -  11/27/14 1200 - - - 79 18 94 % - -  11/27/14 1155 (!) 102/54 mmHg - - - - - - -  11/27/14 1130 - - - 68 (!) 22 97 % - -  11/27/14 1126 (!) 105/58 mmHg - - - - - - -  11/27/14 1115 - - - 68 (!) 24 98 % - -  11/27/14 1110 (!) 111/54 mmHg - - - - - - -  11/27/14 1100 - - - 70 (!) 21 98 % - -  11/27/14 1055 (!) 100/51 mmHg - - - - - - -  11/27/14 1045 - - - 69 (!) 21 100 % - -    11/27/14 1041 95/61 mmHg - - - - - - -  11/27/14 1040 - - - - (!) 21 - - -  11/27/14 1030 - - - 70 19 99 % - -  11/27/14 1026 (!) 104/56 mmHg - - - - - - -  11/27/14 1015 - 97.9 F (36.6 C) - 70 (!) 24 98 % - -  11/27/14 1011 (!) 146/72 mmHg - - - - - - -  Current Weight  11/27/14 184 lb (83.462 kg)     Intake/Output from previous day: 04/21 0701 - 04/22 0700 In: 2920 [P.O.:420; I.V.:2500] Out: 840 [Urine:520; Chest Tube:320]    PHYSICAL EXAM:  Heart: RRR Lungs: Clear, though slightly decreased in R base Wound: Clean and dry Chest tube: Continuous 1/7 air leak    Lab Results: CBC: Recent Labs  11/26/14 0957 11/28/14 0401  WBC 6.2 8.9  HGB 11.5* 10.7*  HCT 34.8* 32.1*  PLT 141* 178   BMET:  Recent Labs  11/26/14 0957 11/28/14 0401  NA 140 136  K 3.4* 4.4  CL 111 105  CO2 21 22  GLUCOSE 117* 129*  BUN 7 12  CREATININE 0.92 1.12  CALCIUM 8.5 7.9*    PT/INR:  Recent Labs  11/26/14 0957  LABPROT 15.9*  INR 1.25   CXR: FINDINGS: 0641 hours. Two right chest tubes remain in place with out evidence for right-sided pneumothorax. Right IJ central line tip overlies the mid SVC level. Left-sided pacer/AICD again noted. The cardio pericardial silhouette is enlarged. Bibasilar opacities likely atelectatic.  IMPRESSION: Stable. Bibasilar atelectasis with no evidence for pneumothorax.   Assessment/Plan: S/P Procedure(s) (LRB): RIGHT VIDEO ASSISTED THORACOSCOPY (Right) DRAINAGE OF PLEURAL EFFUSION (Right) PLEURAL BIOPSY (N/A) TALC PLEURADESIS (Right) CT with small, continuous air leak, CXR with no ptx. CT output 320 ml overnight. Continue to suction for now. Will decrease IVF, d/c A-line, continue ambulation, pulm toilet. BPs low normal. Continue Coreg, hold Lisinopril for now.    LOS: 1 day    COLLINS,GINA H 11/28/2014  Patient seen and examined, agree with above Will place CT to water seal Anterior tube should be able to come out  tomorrow Dc posterior tube when drainage < 200/ DAY  Conchetta Lamia C. Roxan Hockey, MD [facility] [phone]

## 2014-11-29 ENCOUNTER — Inpatient Hospital Stay (HOSPITAL_COMMUNITY): Payer: Commercial Managed Care - HMO

## 2014-11-29 LAB — COMPREHENSIVE METABOLIC PANEL
ALT: 16 U/L (ref 0–53)
ANION GAP: 6 (ref 5–15)
AST: 22 U/L (ref 0–37)
Albumin: 2.5 g/dL — ABNORMAL LOW (ref 3.5–5.2)
Alkaline Phosphatase: 65 U/L (ref 39–117)
BUN: 14 mg/dL (ref 6–23)
CO2: 23 mmol/L (ref 19–32)
CREATININE: 1.01 mg/dL (ref 0.50–1.35)
Calcium: 8.1 mg/dL — ABNORMAL LOW (ref 8.4–10.5)
Chloride: 107 mmol/L (ref 96–112)
GFR, EST AFRICAN AMERICAN: 85 mL/min — AB (ref >90)
GFR, EST NON AFRICAN AMERICAN: 73 mL/min — AB (ref >90)
Glucose, Bld: 128 mg/dL — ABNORMAL HIGH (ref 70–99)
Potassium: 4.1 mmol/L (ref 3.5–5.1)
Sodium: 136 mmol/L (ref 135–145)
Total Bilirubin: 0.8 mg/dL (ref 0.3–1.2)
Total Protein: 5.4 g/dL — ABNORMAL LOW (ref 6.0–8.3)

## 2014-11-29 LAB — CBC
HEMATOCRIT: 34.2 % — AB (ref 39.0–52.0)
Hemoglobin: 11.3 g/dL — ABNORMAL LOW (ref 13.0–17.0)
MCH: 32.3 pg (ref 26.0–34.0)
MCHC: 33 g/dL (ref 30.0–36.0)
MCV: 97.7 fL (ref 78.0–100.0)
Platelets: 180 10*3/uL (ref 150–400)
RBC: 3.5 MIL/uL — AB (ref 4.22–5.81)
RDW: 14.2 % (ref 11.5–15.5)
WBC: 11.6 10*3/uL — AB (ref 4.0–10.5)

## 2014-11-29 NOTE — Progress Notes (Addendum)
TCTS DAILY ICU PROGRESS NOTE                   [address]            [place] [address]          [phone]   2 Days Post-Op Procedure(s) (LRB): RIGHT VIDEO ASSISTED THORACOSCOPY (Right) DRAINAGE OF PLEURAL EFFUSION (Right) PLEURAL BIOPSY (N/A) TALC PLEURADESIS (Right)  Total Length of Stay:  LOS: 2 days   Subjective: Feels ok, breathing is pretty comfortable  Objective: Vital signs in last 24 hours: Temp:  [97.9 F (36.6 C)-98.4 F (36.9 C)] 97.9 F (36.6 C) (04/23 0700) Pulse Rate:  [81-136] 81 (04/23 0834) Cardiac Rhythm:  [-] Normal sinus rhythm (04/23 0834) Resp:  [13-25] 19 (04/23 0840) BP: (103-147)/(54-75) 104/60 mmHg (04/23 0834) SpO2:  [86 %-96 %] 92 % (04/23 0840)  Filed Weights   11/27/14 0557 11/27/14 1400  Weight: 184 lb (83.462 kg) 184 lb (83.462 kg)    Weight change:    Hemodynamic parameters for last 24 hours:    Intake/Output from previous day: 04/22 0701 - 04/23 0700 In: 1580 [P.O.:480; I.V.:1100] Out: 1715 [Urine:1375; Chest Tube:340]  Intake/Output this shift: Total I/O In: -  Out: 40 [Chest Tube:40]  Current Meds: Scheduled Meds: . acetaminophen  1,000 mg Oral 4 times per day   Or  . acetaminophen (TYLENOL) oral liquid 160 mg/5 mL  1,000 mg Oral 4 times per day  . aspirin  81 mg Oral Daily  . atorvastatin  80 mg Oral q1800  . bisacodyl  10 mg Oral Daily  . carvedilol  3.125 mg Oral BID WC  . clopidogrel  75 mg Oral Daily  . dasatinib  100 mg Oral Daily  . enoxaparin (LOVENOX) injection  40 mg Subcutaneous Q24H  . fentaNYL   Intravenous 6 times per day  . senna-docusate  1 tablet Oral QHS   Continuous Infusions: . dextrose 5 % and 0.45 % NaCl with KCl 20 mEq/L 50 mL/hr at 11/28/14 1800   PRN Meds:.diphenhydrAMINE **OR** diphenhydrAMINE, naloxone **AND** sodium chloride, ondansetron (ZOFRAN) IV, ondansetron (ZOFRAN) IV, oxyCODONE, potassium chloride, traMADol  General appearance: alert, cooperative and no  distress Heart: regular rate and rhythm Lungs: coarse BS, dim in bases Abdomen: benign Extremities: no edema Wound: incis dressed  Lab Results: CBC: Recent Labs  11/28/14 0401 11/29/14 0400  WBC 8.9 11.6*  HGB 10.7* 11.3*  HCT 32.1* 34.2*  PLT 178 180   BMET:  Recent Labs  11/28/14 0401 11/29/14 0400  NA 136 136  K 4.4 4.1  CL 105 107  CO2 22 23  GLUCOSE 129* 128*  BUN 12 14  CREATININE 1.12 1.01  CALCIUM 7.9* 8.1*    PT/INR: No results for input(s): LABPROT, INR in the last 72 hours. Radiology: Dg Chest Port 1 View  11/28/2014   CLINICAL DATA:  Subsequent encounter for pleural effusion  EXAM: PORTABLE CHEST - 1 VIEW  COMPARISON:  11/27/2014.  FINDINGS: 0641 hours. Two right chest tubes remain in place with out evidence for right-sided pneumothorax. Right IJ central line tip overlies the mid SVC level. Left-sided pacer/AICD again noted. The cardio pericardial silhouette is enlarged. Bibasilar opacities likely atelectatic.  IMPRESSION: Stable.  Bibasilar atelectasis with no evidence for pneumothorax.   Electronically Signed   By: Misty Stanley M.D.   On: 11/28/2014 08:04   Chest tube + air leak, 400 cc out yesterday, 100 so far today   Assessment/Plan:  S/P Procedure(s) (LRB): RIGHT VIDEO ASSISTED THORACOSCOPY (Right) DRAINAGE OF PLEURAL EFFUSION (Right) PLEURAL BIOPSY (N/A) TALC PLEURADESIS (Right)  1 with air leak present will leave both tubes for now, no pntx on CXR 2 minor leukocytosis. Other labs ok 3 path: Diagnosis 1. Pleura, biopsy, right - BENIGN PLEURA WITH ASSOCIATED CHRONIC INFLAMMATION. - NO ATYPIA OR MALIGNANCY 2. Pleura, biopsy, Right pleural #2 - BENIGN PLEURA. - SCATTERED ACUTE AND CHRONIC INFLAMMATION. - BENIGN LYMPHOID AGGREGATES. - NO ATYPIA OR TUMOR SEEN. 3. Soft tissue, biopsy, Right diaphragm - BENIGN FIBROFATTY SOFT TISSUE WITH SCATTERED CHRONIC INFLAMMATION. - BENIGN MESOTHELIAL CELLS PRESENT. - NO ATYPIA OR TUMOR SEEN. 4. Lymph  node, biopsy, Level 9 #1 - ONE BENIGN LYMPH NODE WITH NO TUMOR SEEN (0/1). 5. Lung, wedge biopsy/resection, Right lower lobe - BENIGN LUNG TISSUE WITH ONE BENIGN INTRAPARENCHYMAL LYMPH NODE (0/1). - BENIGN LYMPHOID AGGREGATES. - NO ATYPIA OR TUMOR SEEN.  GOLD,WAYNE E 11/29/2014 10:28 AM patient examined and medical record reviewed,agree with above note. Tharon Aquas Trigt III 11/30/2014

## 2014-11-30 ENCOUNTER — Inpatient Hospital Stay (HOSPITAL_COMMUNITY): Payer: Commercial Managed Care - HMO

## 2014-11-30 LAB — BODY FLUID CULTURE: CULTURE: NO GROWTH

## 2014-11-30 MED ORDER — ENSURE ENLIVE PO LIQD
237.0000 mL | Freq: Two times a day (BID) | ORAL | Status: DC
  Administered 2014-11-30 – 2014-12-08 (×8): 237 mL via ORAL

## 2014-11-30 NOTE — Progress Notes (Addendum)
TCTS DAILY ICU PROGRESS NOTE                   [address]            [place] [address]          [phone]   3 Days Post-Op Procedure(s) (LRB): RIGHT VIDEO ASSISTED THORACOSCOPY (Right) DRAINAGE OF PLEURAL EFFUSION (Right) PLEURAL BIOPSY (N/A) TALC PLEURADESIS (Right)  Total Length of Stay:  LOS: 3 days   Subjective: Feels ok, not eating much, breathing is pretty comfortable  Objective: Vital signs in last 24 hours: Temp:  [98.1 F (36.7 C)-98.6 F (37 C)] 98.3 F (36.8 C) (04/24 0700) Pulse Rate:  [73-86] 86 (04/24 0726) Cardiac Rhythm:  [-] Normal sinus rhythm (04/24 0844) Resp:  [16-24] 18 (04/24 0800) BP: (105-131)/(46-77) 110/66 mmHg (04/24 0930) SpO2:  [90 %-97 %] 94 % (04/24 0800)  Filed Weights   11/27/14 0557 11/27/14 1400  Weight: 184 lb (83.462 kg) 184 lb (83.462 kg)    Weight change:    Hemodynamic parameters for last 24 hours:    Intake/Output from previous day: 04/23 0701 - 04/24 0700 In: 2550 [P.O.:1400; I.V.:1150] Out: 915 [Urine:575; Chest Tube:340]  Intake/Output this shift: Total I/O In: 120 [P.O.:120] Out: 535 [Urine:175; Chest Tube:360]  Current Meds: Scheduled Meds: . acetaminophen  1,000 mg Oral 4 times per day   Or  . acetaminophen (TYLENOL) oral liquid 160 mg/5 mL  1,000 mg Oral 4 times per day  . aspirin  81 mg Oral Daily  . atorvastatin  80 mg Oral q1800  . bisacodyl  10 mg Oral Daily  . carvedilol  3.125 mg Oral BID WC  . clopidogrel  75 mg Oral Daily  . dasatinib  100 mg Oral Daily  . enoxaparin (LOVENOX) injection  40 mg Subcutaneous Q24H  . fentaNYL   Intravenous 6 times per day  . senna-docusate  1 tablet Oral QHS   Continuous Infusions: . dextrose 5 % and 0.45 % NaCl with KCl 20 mEq/L 50 mL/hr at 11/30/14 0700   PRN Meds:.diphenhydrAMINE **OR** diphenhydrAMINE, naloxone **AND** sodium chloride, ondansetron (ZOFRAN) IV, ondansetron (ZOFRAN) IV, oxyCODONE, potassium chloride, traMADol  General  appearance: alert, cooperative and no distress Heart: regular rate and rhythm Lungs: clear to auscultation bilaterally Abdomen: benign Extremities: no edema Wound: incis healing well  Lab Results: CBC: Recent Labs  11/28/14 0401 11/29/14 0400  WBC 8.9 11.6*  HGB 10.7* 11.3*  HCT 32.1* 34.2*  PLT 178 180   BMET:  Recent Labs  11/28/14 0401 11/29/14 0400  NA 136 136  K 4.4 4.1  CL 105 107  CO2 22 23  GLUCOSE 129* 128*  BUN 12 14  CREATININE 1.12 1.01  CALCIUM 7.9* 8.1*    PT/INR: No results for input(s): LABPROT, INR in the last 72 hours. Radiology: Dg Chest Port 1 View  11/29/2014   CLINICAL DATA:  69 year old male with a history of pleural effusion and chest tube placement.  EXAM: PORTABLE CHEST - 1 VIEW  COMPARISON:  11/28/2014, chest CT 11/25/2014  FINDINGS: Cardiomediastinal silhouette unchanged in size and contour.  Surgical changes of median sternotomy and CABG.  No evidence of pulmonary vascular congestion.  Mixed interstitial and airspace disease at the right base persists. Linear opacities on the left with blunting of the left costophrenic angle. Retrocardiac region not well evaluated.  Unchanged position of the large right-sided thoracostomy tube terminating at the apex. The second right-sided chest tube unchanged in  position. Overlying EKG leads.  Unchanged right IJ central catheter terminating in the superior vena cava.  Unchanged position of left chest wall cardiac pacing device/AICD with single lead projecting over the right ventricle.  No visualized pneumothorax.  IMPRESSION: Similar appearance of 2 right-sided thoracostomy tube with no visualized pneumothorax.  Persisting bibasilar airspace and interstitial opacities, improved on the left. Likely small pleural effusions.  Unchanged right IJ central catheter.  Signed,  Dulcy Fanny. Earleen Newport, DO  [facility]  [facility]   Electronically Signed   By: Corrie Mckusick D.O.   On:  11/29/2014 06:59     Assessment/Plan: S/P Procedure(s) (LRB): RIGHT VIDEO ASSISTED THORACOSCOPY (Right) DRAINAGE OF PLEURAL EFFUSION (Right) PLEURAL BIOPSY (N/A) TALC PLEURADESIS (Right)  1 air leak is significantly improved from yesterday. Small right apical pntx so hesitant to remove ant tube at this time. Still with significant CT drainage (290 yesterday and 590 so far today) 2 ensure feeding supplement ordered     GOLD,WAYNE E 11/30/2014 11:43 AM   Air leak with cough today and significant drainage-300 cc from chest tubes last 24 hours so we'll leave both chest tubes in today. Patient with excellent pain control. Sinus rhythm.

## 2014-12-01 ENCOUNTER — Inpatient Hospital Stay (HOSPITAL_COMMUNITY): Payer: Commercial Managed Care - HMO

## 2014-12-01 ENCOUNTER — Encounter (HOSPITAL_COMMUNITY): Payer: Self-pay | Admitting: Thoracic Surgery (Cardiothoracic Vascular Surgery)

## 2014-12-01 ENCOUNTER — Telehealth: Payer: Self-pay | Admitting: *Deleted

## 2014-12-01 MED ORDER — FUROSEMIDE 20 MG PO TABS
20.0000 mg | ORAL_TABLET | Freq: Every day | ORAL | Status: DC | PRN
  Filled 2014-12-01: qty 1

## 2014-12-01 MED ORDER — POTASSIUM CHLORIDE CRYS ER 20 MEQ PO TBCR
20.0000 meq | EXTENDED_RELEASE_TABLET | Freq: Every day | ORAL | Status: DC
  Administered 2014-12-01 – 2014-12-08 (×8): 20 meq via ORAL
  Filled 2014-12-01 (×8): qty 1

## 2014-12-01 NOTE — Progress Notes (Signed)
4 Days Post-Op Procedure(s) (LRB): RIGHT VIDEO ASSISTED THORACOSCOPY (Right) DRAINAGE OF PLEURAL EFFUSION (Right) PLEURAL BIOPSY (N/A) TALC PLEURADESIS (Right) Subjective: Some pain from chest tubes Ambulated around unit 4 times yesterday  Objective: Vital signs in last 24 hours: Temp:  [97.9 F (36.6 C)-99.6 F (37.6 C)] 97.9 F (36.6 C) (04/25 0718) Pulse Rate:  [75-85] 77 (04/25 0718) Cardiac Rhythm:  [-] Normal sinus rhythm (04/25 0718) Resp:  [18-21] 21 (04/25 0718) BP: (110-133)/(46-73) 114/59 mmHg (04/25 0718) SpO2:  [91 %-97 %] 94 % (04/25 0718)  Hemodynamic parameters for last 24 hours:    Intake/Output from previous day: 04/24 0701 - 04/25 0700 In: 2037 [P.O.:837; I.V.:1200] Out: 1435 [Urine:425; Chest Tube:1010] Intake/Output this shift: Total I/O In: -  Out: 150 [Chest Tube:150]  General appearance: alert, cooperative and no distress Neurologic: intact Heart: regular rate and rhythm Lungs: clear to auscultation bilaterally Wound: clean and dry, some local irritation at chest tube sites, sucking air around posterior CT  Lab Results:  Recent Labs  11/29/14 0400  WBC 11.6*  HGB 11.3*  HCT 34.2*  PLT 180   BMET:  Recent Labs  11/29/14 0400  NA 136  K 4.1  CL 107  CO2 23  GLUCOSE 128*  BUN 14  CREATININE 1.01  CALCIUM 8.1*    PT/INR: No results for input(s): LABPROT, INR in the last 72 hours. ABG    Component Value Date/Time   PHART 7.406 11/28/2014 0403   HCO3 22.4 11/28/2014 0403   TCO2 23.5 11/28/2014 0403   ACIDBASEDEF 1.7 11/28/2014 0403   O2SAT 95.1 11/28/2014 0403   CBG (last 3)  No results for input(s): GLUCAP in the last 72 hours.  Assessment/Plan: S/P Procedure(s) (LRB): RIGHT VIDEO ASSISTED THORACOSCOPY (Right) DRAINAGE OF PLEURAL EFFUSION (Right) PLEURAL BIOPSY (N/A) TALC PLEURADESIS (Right) -  Chest x-ray this AM shows a pneumothorax, has an air leak on exam. He is clearly sucking air around the posterior tube.  Redressed with vaseline gauze, if that doesn't work will need another suture placed  Drainage increased considerably yesterday- will keep both tubes today. Follow output- if remains high may need to re-talc  Ambulating well- continue  No acute cardiac issues  Not using PCA much- will dc and use PO pain meds  lovenox + SCD for DVT prophylaxis  PATH- chronic inflammation   LOS: 4 days    [name] 12/01/2014

## 2014-12-01 NOTE — Telephone Encounter (Signed)
Received fax from Care Management Department on 11/27/14 stating that pt has been admitted to acute care hospital  Hospital: [facility] Date: 11/27/14  Dx:J90-Pleural effusion,not elsewhere classified  Admitting physician: Remo Lipps Hendrickson,MD  Pending authorization:[id number]

## 2014-12-01 NOTE — Progress Notes (Signed)
Medicare Important Message given? YES (If response is "NO", the following Medicare IM given date fields will be blank) Date Medicare IM given:12/01/2014 Medicare IM given by: Joshua Zeringue 

## 2014-12-01 NOTE — Progress Notes (Signed)
Fentanyl PCA discontinued. Total of 4 ml = 40 mcg of fentanyl wasted. Witnessed by Bernadene Person., RN.

## 2014-12-02 ENCOUNTER — Inpatient Hospital Stay (HOSPITAL_COMMUNITY): Payer: Commercial Managed Care - HMO

## 2014-12-02 LAB — BASIC METABOLIC PANEL
ANION GAP: 8 (ref 5–15)
BUN: 14 mg/dL (ref 6–23)
CO2: 22 mmol/L (ref 19–32)
Calcium: 7.8 mg/dL — ABNORMAL LOW (ref 8.4–10.5)
Chloride: 108 mmol/L (ref 96–112)
Creatinine, Ser: 0.95 mg/dL (ref 0.50–1.35)
GFR calc non Af Amer: 82 mL/min — ABNORMAL LOW (ref >90)
Glucose, Bld: 86 mg/dL (ref 70–99)
Potassium: 4.2 mmol/L (ref 3.5–5.1)
Sodium: 138 mmol/L (ref 135–145)

## 2014-12-02 LAB — CBC
HEMATOCRIT: 30 % — AB (ref 39.0–52.0)
Hemoglobin: 9.8 g/dL — ABNORMAL LOW (ref 13.0–17.0)
MCH: 31.8 pg (ref 26.0–34.0)
MCHC: 32.7 g/dL (ref 30.0–36.0)
MCV: 97.4 fL (ref 78.0–100.0)
PLATELETS: 170 10*3/uL (ref 150–400)
RBC: 3.08 MIL/uL — ABNORMAL LOW (ref 4.22–5.81)
RDW: 14.4 % (ref 11.5–15.5)
WBC: 6 10*3/uL (ref 4.0–10.5)

## 2014-12-02 MED ORDER — TALC 5 G PL SUSR
3.0000 g | Freq: Once | INTRAPLEURAL | Status: AC
  Administered 2014-12-02: 3 g via INTRAPLEURAL
  Filled 2014-12-02 (×2): qty 3

## 2014-12-02 MED ORDER — FUROSEMIDE 40 MG PO TABS
40.0000 mg | ORAL_TABLET | Freq: Every day | ORAL | Status: DC
  Administered 2014-12-02 – 2014-12-03 (×2): 40 mg via ORAL
  Filled 2014-12-02 (×3): qty 1

## 2014-12-02 NOTE — Progress Notes (Signed)
5 Days Post-Op Procedure(s) (LRB): RIGHT VIDEO ASSISTED THORACOSCOPY (Right) DRAINAGE OF PLEURAL EFFUSION (Right) PLEURAL BIOPSY (N/A) TALC PLEURADESIS (Right) Subjective: Some discomfort from CT Otherwise feels well  Objective: Vital signs in last 24 hours: Temp:  [97.8 F (36.6 C)-98.6 F (37 C)] 98.1 F (36.7 C) (04/26 0803) Pulse Rate:  [73-84] 84 (04/26 0805) Cardiac Rhythm:  [-] Normal sinus rhythm (04/26 0803) Resp:  [12-25] 25 (04/26 0805) BP: (100-122)/(47-64) 120/64 mmHg (04/26 0803) SpO2:  [91 %-96 %] 94 % (04/26 0805)  Hemodynamic parameters for last 24 hours:    Intake/Output from previous day: 04/25 0701 - 04/26 0700 In: 675.3 [P.O.:480; I.V.:195.3] Out: 760 [Urine:100; Chest Tube:660] Intake/Output this shift: Total I/O In: -  Out: 300 [Chest Tube:300]  General appearance: alert, cooperative and no distress Heart: regular rate and rhythm Lungs: clear to auscultation bilaterally no air leak, serosanguinous drainage  Lab Results:  Recent Labs  12/02/14 0608  WBC 6.0  HGB 9.8*  HCT 30.0*  PLT 170   BMET:  Recent Labs  12/02/14 0608  NA 138  K 4.2  CL 108  CO2 22  GLUCOSE 86  BUN 14  CREATININE 0.95  CALCIUM 7.8*    PT/INR: No results for input(s): LABPROT, INR in the last 72 hours. ABG    Component Value Date/Time   PHART 7.406 11/28/2014 0403   HCO3 22.4 11/28/2014 0403   TCO2 23.5 11/28/2014 0403   ACIDBASEDEF 1.7 11/28/2014 0403   O2SAT 95.1 11/28/2014 0403   CBG (last 3)  No results for input(s): GLUCAP in the last 72 hours.  Assessment/Plan: S/P Procedure(s) (LRB): RIGHT VIDEO ASSISTED THORACOSCOPY (Right) DRAINAGE OF PLEURAL EFFUSION (Right) PLEURAL BIOPSY (N/A) TALC PLEURADESIS (Right) -  Air leak resolved with occlusive dressing at CT sites He still has a small apical pneumo He continues to have relatively high output from the chest tubes Will try talc pleurodesis through the tube to see if that helps Will also  change lasix to 40 mg daily in case there is any CHF component with his chronic systolic heart failure   LOS: 5 days    [name] 12/02/2014

## 2014-12-02 NOTE — Progress Notes (Addendum)
      Anthony Wolfe       Anthony Wolfe             Anthony Wolfe      5 Days Post-Op Procedure(s) (LRB): RIGHT VIDEO ASSISTED THORACOSCOPY (Right) DRAINAGE OF PLEURAL EFFUSION (Right) PLEURAL BIOPSY (N/A) TALC PLEURADESIS (Right)   Subjective:  Anthony Wolfe has some intermittent nausea this morning.  Objective: Vital signs in last 24 hours: Temp:  [97.8 F (36.6 C)-98.6 F (37 C)] 98.1 F (36.7 C) (04/26 0803) Pulse Rate:  [73-84] 84 (04/26 0805) Cardiac Rhythm:  [-] Normal sinus rhythm (04/26 0803) Resp:  [12-25] 25 (04/26 0805) BP: (100-122)/(47-64) 120/64 mmHg (04/26 0803) SpO2:  [91 %-96 %] 94 % (04/26 0805)  Intake/Output from previous day: 04/25 0701 - 04/26 0700 In: 675.3 [P.O.:480; I.V.:195.3] Out: 760 [Urine:100; Chest Tube:660] Intake/Output this shift: Total I/O In: -  Out: 300 [Chest Tube:300]  General appearance: alert, cooperative and no distress Heart: regular rate and rhythm Lungs: diminished breath sounds bibasilar Abdomen: soft, non-tender; bowel sounds normal; no masses,  no organomegaly Wound: clean and dry  Lab Results:  Recent Labs  12/02/14 0608  WBC 6.0  HGB 9.8*  HCT 30.0*  PLT 170   BMET:  Recent Labs  12/02/14 0608  NA 138  K 4.2  CL 108  CO2 22  GLUCOSE 86  BUN 14  CREATININE 0.95  CALCIUM 7.8*    PT/INR: No results for input(s): LABPROT, INR in the last 72 hours. ABG    Component Value Date/Time   PHART 7.406 11/28/2014 0403   HCO3 22.4 11/28/2014 0403   TCO2 23.5 11/28/2014 0403   ACIDBASEDEF 1.7 11/28/2014 0403   O2SAT 95.1 11/28/2014 0403   CBG (last 3)  No results for input(s): GLUCAP in the last 72 hours.  Assessment/Plan: S/P Procedure(s) (LRB): RIGHT VIDEO ASSISTED THORACOSCOPY (Right) DRAINAGE OF PLEURAL EFFUSION (Right) PLEURAL BIOPSY (N/A) TALC PLEURADESIS (Right)  1. Chest tube-continued air leak, pneumothorax improved, chest tube output remains high 2. Pulm- wean oxygen  as tolerated 3. Pain control- good control with use of oral medications 4. Dispo- Talc pleurodesis at bedside today (1005), will clamp tube for 4 hours, repeat CXR in AM   LOS: 5 days    Anthony Wolfe, Anthony Wolfe 12/02/2014  Addendum  See my note from today as well  Remo Lipps C. Roxan Hockey, MD [facility] [phone]

## 2014-12-03 ENCOUNTER — Inpatient Hospital Stay (HOSPITAL_COMMUNITY): Payer: Commercial Managed Care - HMO

## 2014-12-03 LAB — BRAIN NATRIURETIC PEPTIDE: B NATRIURETIC PEPTIDE 5: 247.4 pg/mL — AB (ref 0.0–100.0)

## 2014-12-03 MED ORDER — EPINEPHRINE HCL 0.1 MG/ML IJ SOSY
PREFILLED_SYRINGE | INTRAMUSCULAR | Status: AC
  Filled 2014-12-03: qty 50

## 2014-12-03 NOTE — Progress Notes (Signed)
6 Days Post-Op Procedure(s) (LRB): RIGHT VIDEO ASSISTED THORACOSCOPY (Right) DRAINAGE OF PLEURAL EFFUSION (Right) PLEURAL BIOPSY (N/A) TALC PLEURADESIS (Right) Subjective: C/o nausea Had 2 loose stools yesterday  Objective: Vital signs in last 24 hours: Temp:  [98 F (36.7 C)-98.7 F (37.1 C)] 98.2 F (36.8 C) (04/26 2307) Pulse Rate:  [70-94] 94 (04/27 0320) Cardiac Rhythm:  [-] Normal sinus rhythm (04/27 0320) Resp:  [12-26] 26 (04/27 0320) BP: (106-156)/(46-69) 116/69 mmHg (04/27 0320) SpO2:  [92 %-98 %] 92 % (04/27 0320)  Hemodynamic parameters for last 24 hours:    Intake/Output from previous day: 04/26 0701 - 04/27 0700 In: 800 [P.O.:800] Out: 1340 [Urine:500; Stool:350; Chest Tube:490] Intake/Output this shift:    General appearance: alert and cooperative Neurologic: intact Heart: regular rate and rhythm Lungs: clear to auscultation bilaterally Abdomen: mildly distended, nontender + air leak around posterior CT, serous drainage, wound OK  Lab Results:  Recent Labs  12/02/14 0608  WBC 6.0  HGB 9.8*  HCT 30.0*  PLT 170   BMET:  Recent Labs  12/02/14 0608  NA 138  K 4.2  CL 108  CO2 22  GLUCOSE 86  BUN 14  CREATININE 0.95  CALCIUM 7.8*    PT/INR: No results for input(s): LABPROT, INR in the last 72 hours. ABG    Component Value Date/Time   PHART 7.406 11/28/2014 0403   HCO3 22.4 11/28/2014 0403   TCO2 23.5 11/28/2014 0403   ACIDBASEDEF 1.7 11/28/2014 0403   O2SAT 95.1 11/28/2014 0403   CBG (last 3)  No results for input(s): GLUCAP in the last 72 hours.  Assessment/Plan: S/P Procedure(s) (LRB): RIGHT VIDEO ASSISTED THORACOSCOPY (Right) DRAINAGE OF PLEURAL EFFUSION (Right) PLEURAL BIOPSY (N/A) TALC PLEURADESIS (Right) -  Drainage down significantly overnight- will dc posterior CT today BNP pending- continue lasix 40 mg daily for now Loose stools but no diarrhea, follow   LOS: 6 days    [name] 12/03/2014

## 2014-12-04 ENCOUNTER — Inpatient Hospital Stay (HOSPITAL_COMMUNITY): Payer: Commercial Managed Care - HMO

## 2014-12-04 LAB — CLOSTRIDIUM DIFFICILE BY PCR: CDIFFPCR: NEGATIVE

## 2014-12-04 MED ORDER — SACCHAROMYCES BOULARDII 250 MG PO CAPS
250.0000 mg | ORAL_CAPSULE | Freq: Two times a day (BID) | ORAL | Status: DC
  Administered 2014-12-04 – 2014-12-08 (×9): 250 mg via ORAL
  Filled 2014-12-04 (×10): qty 1

## 2014-12-04 MED ORDER — METRONIDAZOLE 500 MG PO TABS
500.0000 mg | ORAL_TABLET | Freq: Three times a day (TID) | ORAL | Status: DC
  Administered 2014-12-04 (×2): 500 mg via ORAL
  Filled 2014-12-04 (×8): qty 1

## 2014-12-04 MED ORDER — FUROSEMIDE 40 MG PO TABS
40.0000 mg | ORAL_TABLET | Freq: Every day | ORAL | Status: DC
  Filled 2014-12-04: qty 1

## 2014-12-04 MED ORDER — FUROSEMIDE 10 MG/ML IJ SOLN
40.0000 mg | Freq: Once | INTRAMUSCULAR | Status: AC
  Administered 2014-12-04: 40 mg via INTRAVENOUS
  Filled 2014-12-04: qty 4

## 2014-12-04 NOTE — Progress Notes (Signed)
7 Days Post-Op Procedure(s) (LRB): RIGHT VIDEO ASSISTED THORACOSCOPY (Right) DRAINAGE OF PLEURAL EFFUSION (Right) PLEURAL BIOPSY (N/A) TALC PLEURADESIS (Right) Subjective: Frustrated by continued drainage Says he has not eaten in 5 days C/o watery stools- has history of c difficile colitis  Objective: Vital signs in last 24 hours: Temp:  [98.6 F (37 C)-99.2 F (37.3 C)] 98.6 F (37 C) (04/28 0700) Pulse Rate:  [73-85] 85 (04/28 0429) Cardiac Rhythm:  [-] Normal sinus rhythm (04/28 0429) Resp:  [19-24] 22 (04/28 0429) BP: (111-121)/(45-58) 121/52 mmHg (04/28 0429) SpO2:  [91 %-97 %] 97 % (04/28 0429)  Hemodynamic parameters for last 24 hours:    Intake/Output from previous day: 04/27 0701 - 04/28 0700 In: 240 [P.O.:240] Out: 950 [Urine:650; Chest Tube:300] Intake/Output this shift:    General appearance: alert and no distress Neurologic: intact serous drainage from CT  Lab Results:  Recent Labs  12/02/14 0608  WBC 6.0  HGB 9.8*  HCT 30.0*  PLT 170   BMET:  Recent Labs  12/02/14 0608  NA 138  K 4.2  CL 108  CO2 22  GLUCOSE 86  BUN 14  CREATININE 0.95  CALCIUM 7.8*    PT/INR: No results for input(s): LABPROT, INR in the last 72 hours. ABG    Component Value Date/Time   PHART 7.406 11/28/2014 0403   HCO3 22.4 11/28/2014 0403   TCO2 23.5 11/28/2014 0403   ACIDBASEDEF 1.7 11/28/2014 0403   O2SAT 95.1 11/28/2014 0403   CBG (last 3)  No results for input(s): GLUCAP in the last 72 hours.  Assessment/Plan: S/P Procedure(s) (LRB): RIGHT VIDEO ASSISTED THORACOSCOPY (Right) DRAINAGE OF PLEURAL EFFUSION (Right) PLEURAL BIOPSY (N/A) TALC PLEURADESIS (Right) -  Drainage from CT down, but still too much to remove last CT- keep in water seal Chest xray looks good- no pneumo or effusion GI complaints are worrisome. He only received vanco periop, but has a history of a prolonged c difficile colitis- will check c diff, start empiric coverage with falgyl  and florastor BNP elevated at 247- will give lasix IV today then resume 40 mg daily tomorrow   LOS: 7 days    [name] 12/04/2014

## 2014-12-05 ENCOUNTER — Inpatient Hospital Stay (HOSPITAL_COMMUNITY): Payer: Commercial Managed Care - HMO

## 2014-12-05 LAB — BASIC METABOLIC PANEL
Anion gap: 10 (ref 5–15)
BUN: 18 mg/dL (ref 6–23)
CALCIUM: 7.5 mg/dL — AB (ref 8.4–10.5)
CO2: 24 mmol/L (ref 19–32)
Chloride: 102 mmol/L (ref 96–112)
Creatinine, Ser: 1.3 mg/dL (ref 0.50–1.35)
GFR calc Af Amer: 63 mL/min — ABNORMAL LOW (ref >90)
GFR calc non Af Amer: 54 mL/min — ABNORMAL LOW (ref >90)
Glucose, Bld: 100 mg/dL — ABNORMAL HIGH (ref 70–99)
Potassium: 3.8 mmol/L (ref 3.5–5.1)
Sodium: 136 mmol/L (ref 135–145)

## 2014-12-05 LAB — CBC
HCT: 32.4 % — ABNORMAL LOW (ref 39.0–52.0)
HEMATOCRIT: 33.5 % — AB (ref 39.0–52.0)
HEMOGLOBIN: 11.1 g/dL — AB (ref 13.0–17.0)
Hemoglobin: 11 g/dL — ABNORMAL LOW (ref 13.0–17.0)
MCH: 32.7 pg (ref 26.0–34.0)
MCH: 31.5 pg (ref 26.0–34.0)
MCHC: 34 g/dL (ref 30.0–36.0)
MCHC: 33.1 g/dL (ref 30.0–36.0)
MCV: 96.4 fL (ref 78.0–100.0)
MCV: 95.2 fL (ref 78.0–100.0)
Platelets: UNDETERMINED 10*3/uL (ref 150–400)
Platelets: 221 10*3/uL (ref 150–400)
RBC: 3.36 MIL/uL — AB (ref 4.22–5.81)
RBC: 3.52 MIL/uL — AB (ref 4.22–5.81)
RDW: 14.4 % (ref 11.5–15.5)
RDW: 14.4 % (ref 11.5–15.5)
WBC: 6 10*3/uL (ref 4.0–10.5)
WBC: 7.5 10*3/uL (ref 4.0–10.5)

## 2014-12-05 MED ORDER — POTASSIUM CHLORIDE CRYS ER 20 MEQ PO TBCR
20.0000 meq | EXTENDED_RELEASE_TABLET | Freq: Once | ORAL | Status: AC
  Administered 2014-12-05: 20 meq via ORAL
  Filled 2014-12-05: qty 1

## 2014-12-05 MED ORDER — FUROSEMIDE 10 MG/ML IJ SOLN
40.0000 mg | Freq: Once | INTRAMUSCULAR | Status: AC
  Administered 2014-12-05: 40 mg via INTRAVENOUS
  Filled 2014-12-05: qty 4

## 2014-12-05 MED ORDER — FUROSEMIDE 40 MG PO TABS
40.0000 mg | ORAL_TABLET | Freq: Every day | ORAL | Status: DC
  Administered 2014-12-06 – 2014-12-08 (×3): 40 mg via ORAL
  Filled 2014-12-05 (×3): qty 1

## 2014-12-05 NOTE — Discharge Summary (Signed)
Physician Discharge Summary       [address]       [place] [address]             [phone]    Patient ID: Anthony Wolfe MRN: [id number] DOB/AGE: March 12, 1946 69 y.o.  Admit date: 11/27/2014 Discharge date: 12/08/2014   Admission Diagnoses: 1. Recurrent right pleural effusion 2. History of tobacco abuse 3. History of hyperlipidemia 4. History of hypertension 5. History of CAD (s/p CABG 10', NSTEMI) 6. History of TIA 7 History of CML 8. History of bilateral carotid disease 9. History of prostate hypertrophy 10. History of chronic systolic CHF 11. History of C. Dif colitis 12. History of emphysema 13. History of anemia (neo plastic disease) 14. History of ischemic cardiomyopathy   Discharge Diagnoses:  1. Recurrent right pleural effusion 2. History of tobacco abuse 3. History of hyperlipidemia 4. History of hypertension 5. History of CAD (s/p CABG 10', NSTEMI) 6. History of TIA 7 History of CML 8. History of bilateral carotid disease 9. History of prostate hypertrophy 10. History of chronic systolic CHF 11. History of C. Dif colitis 12. History of emphysema 13. History of anemia (neo plastic disease) 14. History of ischemic cardiomyopathy   Procedure (s):  Right video-assisted thoracoscopy, drainage of pleural effusion, pleural biopsy, diaphragm biopsy, lung biopsy, and talc pleurodesis by Dr. Roxan Hockey on 11/27/2014.   Pathology:  1. Pleura, biopsy, right - BENIGN PLEURA WITH ASSOCIATED CHRONIC INFLAMMATION. - NO ATYPIA OR MALIGNANCY 2. Pleura, biopsy, Right pleural #2 - BENIGN PLEURA. - SCATTERED ACUTE AND CHRONIC INFLAMMATION. - BENIGN LYMPHOID AGGREGATES. - NO ATYPIA OR TUMOR SEEN. 3. Soft tissue, biopsy, Right diaphragm - BENIGN FIBROFATTY SOFT TISSUE WITH SCATTERED CHRONIC INFLAMMATION. - BENIGN MESOTHELIAL CELLS PRESENT. - NO ATYPIA OR TUMOR SEEN. 4. Lymph node, biopsy, Level 9 #1 - ONE BENIGN LYMPH NODE WITH NO TUMOR SEEN  (0/1). 5. Lung, wedge biopsy/resection, Right lower lobe - BENIGN LUNG TISSUE WITH ONE BENIGN INTRAPARENCHYMAL LYMPH NODE (0/1). - BENIGN LYMPHOID AGGREGATES. - NO ATYPIA OR TUMOR SEEN.   History of Presenting Illness: This is a 69 year old man with a complex medical history including coronary artery disease, status post CABG in 2010, non-ST elevation MI with ventricular tachycardia in 2015, ICD placement, ischemic cardiomyopathy, congestive heart failure, bilateral carotid artery disease, hypertension, hyperlipidemia, anxiety, depression, pneumonia, COPD, and C. difficile colitis. 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  He first developed a right pleural effusion back in October 2015. This was in the setting of congestive heart failure and pneumonia. He was treated with antibiotics which improved his pneumonia but resulted in a severe case of C. difficile colitis. He said he was having difficulties with that for about 3 months before finally resolved.  He developed a right pleural effusion. He had thoracentesis in November which showed the fluid was an exudate. Cytology showed reactive mesothelial cells. His effusion recurred and he had another thoracentesis in December with the same findings. He was relatively stable until mid March when he became more short of breath. He had a repeat thoracentesis draining 1.4 L of fluid on March 17. He improved symptomatically after that drainage but noted return of symptoms and required another drainage on March 30. 2 L was withdrawn at that time. Again the fluid was exudative with cytology showing reactive mesothelial cells. He once again improved after that the repeat thoracentesis but over the past couple days has noted that his "chest is filling back up again." He  has shortness of breath with exertion, a dry cough, decreased energy, he denies orthopnea. He also complains of decreased energy and frequent urination.  CT of chest showed an  increase in volume of right pleural effusion, new left pleural effusion, emphysema, COPD, small pulmonary nodules. He was instructed to hold Plavix after dose on Friday April 15 in anticipation of surgery. Dr. Roxan Hockey discussed the need forrRight VATS, drainage of right pleural effusion, pleural biopsies, possible talc pleurodesis. Potential risks, benefits, and complications were discussed with the patient and he agreed to proceed with surgery.    Brief Hospital Course:  He remained afebrile and hemodynamically stable. Daily chest x-rays were obtained and showed a small right apical pneumothorax. Because of continued high chest tube output,talc pleurodesis was performed at the bedside on 04/26. One chest tube was then removed 04/27. He was given daily Lasix for volume overload. He developed loose, watery stools.  He has a history of prolonged C. Difficile colitis in the past, so he was started on Flagyl empirically and Florastor.  C. Diff ultimately was NEGATIVE and Flagyl was discontinued. He has had a marginal appetite, but this is improving. He was noted to have some tachycardia and episodes of bigeminy postop and his beta blocker dose was titrated up.   He is ambulating on room air.  Chest tube output has slowly decreased, and his remaining chest tube was removed on 12/08/2014.  He is remains otherwise surgically stable and is ready for discharge home on today's date.    Latest Vital Signs: Blood pressure 117/54, pulse 83, temperature 98.7 F (37.1 C), temperature source Oral, resp. rate 21, height 5\' 9"  (1.753 m), weight 184 lb (83.462 kg), SpO2 96 %.  Physical Exam: Cardiovascular: RRR Pulmonary: Clear to auscultation on left and diminished at right base ; no rales, wheezes, or rhonchi. Abdomen: Soft, non tender, bowel sounds present. Extremities: Trace bilateral lower extremity edema. Wounds: Clean and dry. No erythema or signs of infection.   Discharge Condition:Stable and discharged  to home  Recent laboratory studies:  Lab Results  Component Value Date   WBC 7.5 12/05/2014   HGB 11.1* 12/05/2014   HCT 33.5* 12/05/2014   MCV 95.2 12/05/2014   PLT 221 12/05/2014   Lab Results  Component Value Date   NA 136 12/06/2014   K 3.4* 12/06/2014   CL 102 12/06/2014   CO2 24 12/06/2014   CREATININE 1.16 12/06/2014   GLUCOSE 92 12/06/2014      Diagnostic Studies:  Ct Chest Wo Contrast  11/25/2014   CLINICAL DATA:  Followup recurrent right pleural effusion. History of lung nodule.  EXAM: CT CHEST WITHOUT CONTRAST  TECHNIQUE: Multidetector CT imaging of the chest was performed following the standard protocol without IV contrast.  COMPARISON:  06/10/2014  FINDINGS: Mediastinum: Previous median sternotomy and CABG procedure. The heart size is moderately enlarged. No pericardial effusion identified. 9 mm right paratracheal lymph node is identified, image 26/series 3. Previously 11 mm. The trachea appears patent and is midline. Normal appearance of the esophagus.  Lungs/Pleura: There is a large right pleural effusion that appears increased in volume from previous exam. There is compressive type consolidation involving the right middle lobe and right lower lobe. New small left pleural effusion. Moderate changes of centrilobular and paraseptal emphysema. Diffuse bronchial wall thickening noted. Improved appearance of post inflammatory/infectious changes within the right upper lobe. Subpleural nodule within the left lower lobe measures 4 mm, image 37/series 4 and is stable from previous exam. Right  middle lobe pulmonary nodule measures 3 mm and has a nonspecific appearance but is new from previous exam.  Upper Abdomen: Incidental imaging through the upper abdomen shows a low attenuation structure in the right hepatic which is too small to characterize but appears unchanged measuring 7 mm, image 65/series 3. The in stress set stable asymmetric enlargement of the left adrenal gland. Large cyst  arises from the upper pole right kidney, partially visualized.  Musculoskeletal: Review of the visualized osseous structures is negative for aggressive lytic or sclerotic bone lesion.  IMPRESSION: 1. Increase in volume of right pleural effusion. New left effusion. There is associated compressive type consolidation involving the right lower lobe and right middle lobe. 2. Diffuse bronchial wall thickening with emphysema, as above; imaging findings suggestive of underlying COPD. 3. Postinflammatory/infectious changes within the right lung have improved from previous exam. 4. Small pulmonary nodules are again noted measuring up to 4 mm. There is a new nodule in the right middle lobe which measures 3 mm. In a patient that is at increased risk for bronchogenic carcinoma, follow-up chest CT at 1 year is recommended. If the patient is at low risk, no follow-up is needed. This recommendation follows the consensus statement: Guidelines for Management of Small Pulmonary Nodules Detected on CT Scans: A Statement from the [employer] as published in Radiology 2005; 237:395-400.   Electronically Signed   By: Kerby Moors M.D.   On: 11/25/2014 09:58   Dg Chest Port 1 View  12/05/2014   CLINICAL DATA:  Pleural effusion.  EXAM: PORTABLE CHEST - 1 VIEW  COMPARISON:  None.  FINDINGS: Right chest tube and right IJ line in stable position. Mediastinum hilar structures normal. Prior CABG. Cardiac pacer in stable position. Cardiomegaly with normal pulmonary vascularity. Cardiomegaly is stable. Persistent bibasilar atelectasis, no change. No pneumothorax noted on today's exam. No significant pleural effusion.  IMPRESSION: 1. Lines and tubes in stable position. Right chest tube unchanged. No pneumothorax noted on today's exam. 2. Persistent bibasilar atelectasis. 3. Prior CABG. Cardiac pacer in stable position. Stable cardiomegaly. No pulmonary venous congestion .   Electronically Signed   By: Marcello Moores  Register   On: 12/05/2014  07:41   US Thoracentesis Asp Pleural Space W/img Guide  11/05/2014   CLINICAL DATA:  Recurrent right pleural effusion. Request therapeutic thoracentesis.  EXAM: ULTRASOUND GUIDED RIGHT THORACENTESIS  COMPARISON:  Previous right thoracentesis on 07/15/2014  PROCEDURE: An ultrasound guided thoracentesis was thoroughly discussed with the patient and questions answered. The benefits, risks, alternatives and complications were also discussed. The patient understands and wishes to proceed with the procedure. Written consent was obtained.  Ultrasound was performed to localize and mark an adequate pocket of fluid in the right chest. The area was then prepped and draped in the normal sterile fashion. 1% Lidocaine was used for local anesthesia. Under ultrasound guidance a 6 French Safe-T-Centesis catheter was introduced. Thoracentesis was performed. The catheter was removed and a dressing applied.  COMPLICATIONS: None immediate.  FINDINGS: A total of approximately 2.1 L of clear, amber colored fluid was removed. A fluid sample was notsent for laboratory analysis.  IMPRESSION: Successful ultrasound guided right thoracentesis yielding 2.1 L of pleural fluid.  Read by: Ascencion Dike PA-C   Electronically Signed   By: Jerilynn Mages.  Shick M.D.   On: 11/05/2014 11:36   Discharge Medications:   Medication List    STOP taking these medications        lisinopril 5 MG tablet  Commonly known as:  PRINIVIL,ZESTRIL      TAKE these medications        aspirin 81 MG chewable tablet  Chew 1 tablet (81 mg total) by mouth daily.     atorvastatin 80 MG tablet  Commonly known as:  LIPITOR  TAKE 1 TABLET EVERY DAY  AT  6:00  PM     carvedilol 6.25 MG tablet  Commonly known as:  COREG  Take 1 tablet (6.25 mg total) by mouth 2 (two) times daily.     clopidogrel 75 MG tablet  Commonly known as:  PLAVIX  Take 1 tablet (75 mg total) by mouth daily.     dasatinib 100 MG tablet  Commonly known as:  SPRYCEL  Take 100 mg by mouth  daily.     furosemide 40 MG tablet  Commonly known as:  LASIX  Take 1 tablet (40 mg total) by mouth daily.     guaiFENesin 600 MG 12 hr tablet  Commonly known as:  MUCINEX  Take 1200 mg by mouth 2-3 times a day as needed for cough or to loosen phlegm     nitroGLYCERIN 0.4 MG SL tablet  Commonly known as:  NITROSTAT  Place 1 tablet (0.4 mg total) under the tongue every 5 (five) minutes as needed for chest pain.     oxyCODONE 5 MG immediate release tablet  Commonly known as:  Oxy IR/ROXICODONE  Take 1-2 tablets (5-10 mg total) by mouth every 4 (four) hours as needed for severe pain.     potassium chloride SA 20 MEQ tablet  Commonly known as:  K-DUR,KLOR-CON  Take 20 mEq by mouth daily.     saccharomyces boulardii 250 MG capsule  Commonly known as:  FLORASTOR  Take 1 capsule (250 mg total) by mouth 2 (two) times daily.         Follow Up Appointments: Follow-up Information    Follow up with [name], MD On 12/23/2014.   Specialty:  Cardiothoracic Surgery   Why:  PA/LAT CXR to be taken (at [facility] which is in the same building as Dr. Leonarda Salon office) on 12/23/2014 at 3:00 pm;Appointment time is at 4:00 pm   Contact information:   [place] [address] [phone]       Signed: Suzzanne Cloud HPA-C 12/08/2014, 10:54 AM

## 2014-12-05 NOTE — Progress Notes (Addendum)
      [address]       [place] [address]             [phone]       8 Days Post-Op Procedure(s) (LRB): RIGHT VIDEO ASSISTED THORACOSCOPY (Right) DRAINAGE OF PLEURAL EFFUSION (Right) PLEURAL BIOPSY (N/A) TALC PLEURADESIS (Right)  Subjective: Less loose stools. He really does not like taking Flagyl. He has a flat affect (likley underlying depression).  Objective: Vital signs in last 24 hours: Temp:  [98 F (36.7 C)-99.5 F (37.5 C)] 98.2 F (36.8 C) (04/29 0820) Pulse Rate:  [74-90] 83 (04/29 0820) Cardiac Rhythm:  [-] Normal sinus rhythm (04/29 0328) Resp:  [20-28] 20 (04/29 0820) BP: (112-135)/(50-61) 114/56 mmHg (04/29 0820) SpO2:  [91 %-96 %] 95 % (04/29 0820)     Intake/Output from previous day: 04/28 0701 - 04/29 0700 In: -  Out: 5681 [Urine:775; Chest Tube:470]   Physical Exam:  Cardiovascular: RRR Pulmonary: Clear to auscultation on left and diminished at right base ; no rales, wheezes, or rhonchi. Abdomen: Soft, non tender, bowel sounds present. Extremities: Trace bilateral lower extremity edema. Wounds: Clean and dry.  No erythema or signs of infection. Chest Tube: to water seal, no air leak  Lab Results: CBC: Recent Labs  12/05/14 0530  WBC 6.0  HGB 11.0*  HCT 32.4*  PLT PLATELET CLUMPS NOTED ON SMEAR, UNABLE TO ESTIMATE   BMET:  Recent Labs  12/05/14 0530  NA 136  K 3.8  CL 102  CO2 24  GLUCOSE 100*  BUN 18  CREATININE 1.30  CALCIUM 7.5*    PT/INR: No results for input(s): LABPROT, INR in the last 72 hours. ABG:  INR: Will add last result for INR, ABG once components are confirmed Will add last 4 CBG results once components are confirmed  Assessment/Plan:  1. CV - SR in the 80's. On Coreg 3.125 mg bid and Plavix 75 mg daily. 2.  Pulmonary - Chest tube with over 200 cc last 12 hours. Chest tube to remain for now. CXR shows no pneumothorax, bibasilar atelectasis. 3.  Volume overload-Lasix 40 mg daily  orally 4. GI- Less loose stools. C. Dif NEGATIVE. On Flagyl and Florastar. Will stop Flagyl. Poor po intake. Will not take Ensure. 5. Supplement potassium 6. Regarding low affect, will discuss with Dr. Roxan Hockey.  ZIMMERMAN,DONIELLE MPA-C 12/05/2014,9:38 AM Patient seen and examined, agree with above He says his BP dropped when he ambulated, wife says it was his oxygen saturation Needs to ambulate c diff negative- dc flagyl, continue florastor Still not eating well His CT output is down, dc CT when < 300 ml/ day

## 2014-12-05 NOTE — Progress Notes (Signed)
Medicare Important Message given? YES (If response is "NO", the following Medicare IM given date fields will be blank) Date Medicare IM given:12/05/2014 Medicare IM given by: Osama Coleson 

## 2014-12-05 NOTE — Discharge Instructions (Signed)
Thoracoscopy °Care After °Refer to this sheet in the next few weeks. These discharge instructions provide you with general information on caring for yourself after you leave the hospital. Your caregiver may also give you specific instructions. Your treatment has been planned according to the most current medical practices available, but unavoidable complications sometimes occur. If you have any problems or questions after discharge, call your caregiver. °HOME CARE INSTRUCTIONS  °· Remove the bandage (dressing) over your chest tube site as directed by your caregiver. °· It is normal to be sore for a couple weeks following surgery. See your caregiver if this seems to be getting worse rather than better. °· Only take over-the-counter or prescription medicines for pain, discomfort, or fever as directed by your caregiver. It is very important to take pain medicine when you need it so that you will cough and breathe deeply enough to clear mucus (phlegm) and expand your lungs. °· If it hurts to cough, hold a pillow against your chest when you cough. This may help with the discomfort. In spite of the discomfort, cough frequently, as this helps protect against getting an infection in your lung (pneumonia). °· Taking deep breaths keeps lungs inflated and protects against pneumonia. Most patients will go home with an incentive spirometer that encourages deep breathing. °· You may resume a normal diet and activities as directed. °· Use showers for bathing until you see your caregiver, or as instructed. °· Change dressings if necessary or as directed. °· Avoid lifting or driving until you are instructed otherwise. °· Make an appointment to see your caregiver for stitch (suture) or staple removal when instructed. °· Do not travel by airplane for 2 weeks after the chest tube is removed. °SEEK MEDICAL CARE IF:  °· You are bleeding from your wounds. °· You have redness, swelling, or increasing pain in the wounds. °· Your heartbeat  feels irregular or very fast. °· There is pus coming from your wounds. °· There is a bad smell coming from the wound or dressing. °SEEK IMMEDIATE MEDICAL CARE IF:  °· You have a fever. °· You develop a rash. °· You have difficulty breathing. °· You develop any reaction or side effects to medicines given. °· You develop lightheadedness or feel faint. °· You develop shortness of breath or chest pain. °MAKE SURE YOU:  °· Understand these instructions. °· Will watch your condition. °· Will get help right away if you are not doing well or get worse. °Document Released: 02/11/2005 Document Revised: 10/17/2011 Document Reviewed: 01/12/2011 °[employer]® Patient Information ©2015 [employer]. This information is not intended to replace advice given to you by your health care provider. Make sure you discuss any questions you have with your health care provider. ° °

## 2014-12-06 ENCOUNTER — Inpatient Hospital Stay (HOSPITAL_COMMUNITY): Payer: Commercial Managed Care - HMO

## 2014-12-06 LAB — BASIC METABOLIC PANEL
Anion gap: 10 (ref 5–15)
BUN: 14 mg/dL (ref 6–23)
CO2: 24 mmol/L (ref 19–32)
Calcium: 7.6 mg/dL — ABNORMAL LOW (ref 8.4–10.5)
Chloride: 102 mmol/L (ref 96–112)
Creatinine, Ser: 1.16 mg/dL (ref 0.50–1.35)
GFR calc Af Amer: 72 mL/min — ABNORMAL LOW (ref >90)
GFR calc non Af Amer: 62 mL/min — ABNORMAL LOW (ref >90)
GLUCOSE: 92 mg/dL (ref 70–99)
POTASSIUM: 3.4 mmol/L — AB (ref 3.5–5.1)
SODIUM: 136 mmol/L (ref 135–145)

## 2014-12-06 MED ORDER — METOPROLOL TARTRATE 1 MG/ML IV SOLN: 5.0000 mg | INTRAVENOUS | Status: DC | PRN

## 2014-12-06 MED ORDER — CARVEDILOL 6.25 MG PO TABS
6.2500 mg | ORAL_TABLET | Freq: Two times a day (BID) | ORAL | Status: DC
  Administered 2014-12-06 – 2014-12-08 (×4): 6.25 mg via ORAL
  Filled 2014-12-06 (×6): qty 1

## 2014-12-06 MED ORDER — GUAIFENESIN ER 600 MG PO TB12
1200.0000 mg | ORAL_TABLET | Freq: Two times a day (BID) | ORAL | Status: DC | PRN
  Administered 2014-12-06: 1200 mg via ORAL
  Filled 2014-12-06 (×2): qty 2

## 2014-12-06 NOTE — Progress Notes (Addendum)
      [address]       [place] [address]             [phone]      9 Days Post-Op Procedure(s) (LRB): RIGHT VIDEO ASSISTED THORACOSCOPY (Right) DRAINAGE OF PLEURAL EFFUSION (Right) PLEURAL BIOPSY (N/A) TALC PLEURADESIS (Right)   Subjective:  Anthony Wolfe has no new complaints.  He continues to feel okay and remains depressed.  His PO intake also remains poor, just stating he feels full and food doesn't taste good.  +ambulation +BM, however diarrhea resolved Objective: Vital signs in last 24 hours: Temp:  [98.2 F (36.8 C)-99 F (37.2 C)] 98.3 F (36.8 C) (04/30 0753) Pulse Rate:  [34-132] 132 (04/30 0753) Cardiac Rhythm:  [-] Sinus tachycardia (04/30 0800) Resp:  [20-28] 25 (04/30 0707) BP: (103-132)/(42-65) 132/58 mmHg (04/30 0707) SpO2:  [92 %-95 %] 94 % (04/30 0707)  Intake/Output from previous day: 04/29 0701 - 04/30 0700 In: 360 [P.O.:360] Out: 420 [Urine:400; Chest Tube:20] Intake/Output this shift: Total I/O In: -  Out: 50 [Chest Tube:50]  General appearance: alert, cooperative and no distress Heart: regular rate and rhythm Lungs: clear to auscultation bilaterally Abdomen: soft, non-tender; bowel sounds normal; no masses,  no organomegaly Wound: clean and dry  Lab Results:  Recent Labs  12/05/14 0530 12/05/14 0900  WBC 6.0 7.5  HGB 11.0* 11.1*  HCT 32.4* 33.5*  PLT PLATELET CLUMPS NOTED ON SMEAR, UNABLE TO ESTIMATE 221   BMET:  Recent Labs  12/05/14 0530 12/06/14 0442  NA 136 136  K 3.8 3.4*  CL 102 102  CO2 24 24  GLUCOSE 100* 92  BUN 18 14  CREATININE 1.30 1.16  CALCIUM 7.5* 7.6*    PT/INR: No results for input(s): LABPROT, INR in the last 72 hours. ABG    Component Value Date/Time   PHART 7.406 11/28/2014 0403   HCO3 22.4 11/28/2014 0403   TCO2 23.5 11/28/2014 0403   ACIDBASEDEF 1.7 11/28/2014 0403   O2SAT 95.1 11/28/2014 0403   CBG (last 3)  No results for input(s): GLUCAP in the last 72  hours.  Assessment/Plan: S/P Procedure(s) (LRB): RIGHT VIDEO ASSISTED THORACOSCOPY (Right) DRAINAGE OF PLEURAL EFFUSION (Right) PLEURAL BIOPSY (N/A) TALC PLEURADESIS (Right)  1. CV- NSR currently had episodes of tachycardia with Bi/Trigeminy this morning- resolved after dose of Coreg, will increase dose to 6.25 mg BID 2. Pulm- off oxygen, chest tube continues to drain currently at 450 on Pleurovac, if output is less than 300 ml can remove tube today per Dr. Roxan Hockey 3. GI- loose stools resolved, C. Diff negative, off Flagyl, continue Florastor for now 4. Malnutrition- patient not hungry feels full, may benefit from appetite stimulant 5. Renal- hypokalemia, replaced per protocol 6. Dispo- patient stable, continue current care,   LOS: 9 days    BARRETT, ERIN 12/06/2014  I have seen and examined Anthony Wolfe and agree with the above assessment  and plan.  Grace Isaac MD Beeper [phone] Office [phone] 12/06/2014 2:32 PM

## 2014-12-07 LAB — AFB CULTURE WITH SMEAR (NOT AT ARMC)
ACID FAST SMEAR: NONE SEEN
Special Requests: NORMAL

## 2014-12-07 MED ORDER — SODIUM CHLORIDE 0.9 % IJ SOLN
10.0000 mL | Freq: Two times a day (BID) | INTRAMUSCULAR | Status: DC
  Administered 2014-12-07: 10 mL
  Administered 2014-12-07: 20 mL
  Administered 2014-12-08: 10 mL

## 2014-12-07 MED ORDER — SODIUM CHLORIDE 0.9 % IJ SOLN: 10.0000 mL | INTRAMUSCULAR | Status: DC | PRN

## 2014-12-07 NOTE — Progress Notes (Addendum)
      [address]       [place] [address]             [phone]      10 Days Post-Op Procedure(s) (LRB): RIGHT VIDEO ASSISTED THORACOSCOPY (Right) DRAINAGE OF PLEURAL EFFUSION (Right) PLEURAL BIOPSY (N/A) TALC PLEURADESIS (Right)   Subjective:  Mr. Mauss is feeling better today.  He has been able to eat a little bit more this morning.  He has ambulated.  Is hopeful his chest tube will be removed.  Objective: Vital signs in last 24 hours: Temp:  [97.9 F (36.6 C)-99.7 F (37.6 C)] 99.7 F (37.6 C) (05/01 0743) Pulse Rate:  [82-94] 94 (05/01 0743) Cardiac Rhythm:  [-] Normal sinus rhythm (05/01 0800) Resp:  [18-24] 22 (05/01 0743) BP: (109-136)/(55-76) 124/60 mmHg (05/01 0743) SpO2:  [94 %-97 %] 97 % (05/01 0743)  Intake/Output from previous day: 04/30 0701 - 05/01 0700 In: -  Out: 404 [Urine:202; [place]; Chest Tube:200]  General appearance: alert, cooperative and no distress Heart: regular rate and rhythm Lungs: clear to auscultation bilaterally Abdomen: soft, non-tender; bowel sounds normal; no masses,  no organomegaly Wound: clean and dry  Lab Results:  Recent Labs  12/05/14 0530 12/05/14 0900  WBC 6.0 7.5  HGB 11.0* 11.1*  HCT 32.4* 33.5*  PLT PLATELET CLUMPS NOTED ON SMEAR, UNABLE TO ESTIMATE 221   BMET:  Recent Labs  12/05/14 0530 12/06/14 0442  NA 136 136  K 3.8 3.4*  CL 102 102  CO2 24 24  GLUCOSE 100* 92  BUN 18 14  CREATININE 1.30 1.16  CALCIUM 7.5* 7.6*    PT/INR: No results for input(s): LABPROT, INR in the last 72 hours. ABG    Component Value Date/Time   PHART 7.406 11/28/2014 0403   HCO3 22.4 11/28/2014 0403   TCO2 23.5 11/28/2014 0403   ACIDBASEDEF 1.7 11/28/2014 0403   O2SAT 95.1 11/28/2014 0403   CBG (last 3)  No results for input(s): GLUCAP in the last 72 hours.  Assessment/Plan: S/P Procedure(s) (LRB): RIGHT VIDEO ASSISTED THORACOSCOPY (Right) DRAINAGE OF PLEURAL EFFUSION (Right) PLEURAL  BIOPSY (N/A) TALC PLEURADESIS (Right)  1. CV- NSR, tolerating increased dose of Coreg 2. Chest tube- 200 cc output yesterday, no air leak present if okay with Dr. Servando Snare will d/c tube today 3. Malnutrition- good oral intake at breakfast patient encouraged to eat what he is able    LOS: 10 days    BARRETT, ERIN 12/07/2014  Leave chest tube one more day until dry, drainage decreasing I have seen and examined Meade Maw and agree with the above assessment  and plan.  Grace Isaac MD Beeper [phone] Office [phone] 12/07/2014 11:59 AM

## 2014-12-08 ENCOUNTER — Inpatient Hospital Stay (HOSPITAL_COMMUNITY): Payer: Commercial Managed Care - HMO

## 2014-12-08 MED ORDER — SACCHAROMYCES BOULARDII 250 MG PO CAPS: 250.0000 mg | ORAL_CAPSULE | Freq: Two times a day (BID) | ORAL | Status: DC

## 2014-12-08 MED ORDER — FUROSEMIDE 40 MG PO TABS: 40.0000 mg | ORAL_TABLET | Freq: Every day | ORAL | Status: DC

## 2014-12-08 MED ORDER — OXYCODONE HCL 5 MG PO TABS: 5.0000 mg | ORAL_TABLET | ORAL | Status: DC | PRN

## 2014-12-08 MED ORDER — CARVEDILOL 6.25 MG PO TABS: 6.2500 mg | ORAL_TABLET | Freq: Two times a day (BID) | ORAL | Status: DC

## 2014-12-08 NOTE — Progress Notes (Signed)
11 Days Post-Op Procedure(s) (LRB): RIGHT VIDEO ASSISTED THORACOSCOPY (Right) DRAINAGE OF PLEURAL EFFUSION (Right) PLEURAL BIOPSY (N/A) TALC PLEURADESIS (Right) Subjective: Feels better today, eating a little, ambulating without difficulty  Objective: Vital signs in last 24 hours: Temp:  [97.9 F (36.6 C)-98.4 F (36.9 C)] 98.4 F (36.9 C) (05/02 0432) Pulse Rate:  [81-90] 83 (05/02 0432) Cardiac Rhythm:  [-] Normal sinus rhythm (05/01 2000) Resp:  [20-23] 20 (05/02 0432) BP: (108-122)/(51-69) 108/51 mmHg (05/02 0432) SpO2:  [92 %-96 %] 96 % (05/02 0432)  Hemodynamic parameters for last 24 hours:    Intake/Output from previous day: 05/01 0701 - 05/02 0700 In: -  Out: 250 [Urine:150; Chest Tube:100] Intake/Output this shift:    General appearance: alert, cooperative and no distress Neurologic: intact Heart: regular rate and rhythm Lungs: clear to auscultation bilaterally  Lab Results:  Recent Labs  12/05/14 0900  WBC 7.5  HGB 11.1*  HCT 33.5*  PLT 221   BMET:  Recent Labs  12/06/14 0442  NA 136  K 3.4*  CL 102  CO2 24  GLUCOSE 92  BUN 14  CREATININE 1.16  CALCIUM 7.6*    PT/INR: No results for input(s): LABPROT, INR in the last 72 hours. ABG    Component Value Date/Time   PHART 7.406 11/28/2014 0403   HCO3 22.4 11/28/2014 0403   TCO2 23.5 11/28/2014 0403   ACIDBASEDEF 1.7 11/28/2014 0403   O2SAT 95.1 11/28/2014 0403   CBG (last 3)  No results for input(s): GLUCAP in the last 72 hours.  Assessment/Plan: S/P Procedure(s) (LRB): RIGHT VIDEO ASSISTED THORACOSCOPY (Right) DRAINAGE OF PLEURAL EFFUSION (Right) PLEURAL BIOPSY (N/A) TALC PLEURADESIS (Right) -  Overall much improved from Friday He is eating better and ambulating without difficulty His CT output is down significantly- will dc chest tube and central line Dc home later today if post removal CXR OK He will need to stay on 40 mg of lasix daily   LOS: 11 days    [name] 12/08/2014

## 2014-12-10 ENCOUNTER — Telehealth: Payer: Self-pay | Admitting: *Deleted

## 2014-12-10 NOTE — Telephone Encounter (Signed)
Submitted humana referral thru acuity connect for authorization to Dr. Fransico Him, MD with authorization number [id number]  Requesting provider:Warren T. Pickard,MD  Treating provider: Dr. Vonna Drafts  Number of visits:6  Start Date: 12/15/14  End Date:06/13/15  Dx:R00.1-bradycardia,unspecified  Copy has been faxed to Dr. Landis Gandy office

## 2014-12-10 NOTE — Telephone Encounter (Signed)
Received fax from [facility] from Hospital on 12/08/14  Date of admit:11/27/14  Date of discharge:12/08/14  Level of Care:Inpatient Acute Medical Care  Discharging facility:[facility]  Attending CBIPJRPZP:SUGAYG Hendrickson,MD  EFU:WTKTCC T. Pickard,MD  Discharge Dispostion:Discharged to Home, self care(routine discharge)  DX:J90.-Pleural effusion,not elsewhere classified

## 2014-12-15 ENCOUNTER — Ambulatory Visit (INDEPENDENT_AMBULATORY_CARE_PROVIDER_SITE_OTHER): Payer: Commercial Managed Care - HMO | Admitting: *Deleted

## 2014-12-15 DIAGNOSIS — I255 Ischemic cardiomyopathy: Secondary | ICD-10-CM | POA: Diagnosis not present

## 2014-12-15 DIAGNOSIS — I5023 Acute on chronic systolic (congestive) heart failure: Secondary | ICD-10-CM

## 2014-12-15 NOTE — Progress Notes (Signed)
Remote ICD transmission.   

## 2014-12-17 ENCOUNTER — Encounter: Payer: Self-pay | Admitting: Cardiology

## 2014-12-17 LAB — CUP PACEART REMOTE DEVICE CHECK
Brady Statistic RV Percent Paced: 0.1 %
HighPow Impedance: 43 Ohm
Lead Channel Sensing Intrinsic Amplitude: 7 mV
Lead Channel Setting Pacing Amplitude: 2 V
Lead Channel Setting Sensing Sensitivity: 0.3 mV
MDC IDC MSMT LEADCHNL RV IMPEDANCE VALUE: 399 Ohm
MDC IDC MSMT LEADCHNL RV PACING THRESHOLD AMPLITUDE: 0.625 V
MDC IDC MSMT LEADCHNL RV PACING THRESHOLD PULSEWIDTH: 0.4 ms
MDC IDC SESS DTM: 20160511164341
MDC IDC SET LEADCHNL RV PACING PULSEWIDTH: 0.4 ms
Zone Setting Detection Interval: 300 ms
Zone Setting Detection Interval: 340 ms
Zone Setting Detection Interval: 370 ms

## 2014-12-17 NOTE — Patient Outreach (Signed)
[facility]) Care Management  12/17/2014  Anthony Wolfe [birth year] [id number]  Referral from [place], assigned [name], RN.  [name]. [name] CM Assistant Phone: [phone] Fax: [phone]

## 2014-12-22 ENCOUNTER — Other Ambulatory Visit: Payer: Self-pay | Admitting: Thoracic Surgery (Cardiothoracic Vascular Surgery)

## 2014-12-22 DIAGNOSIS — J90 Pleural effusion, not elsewhere classified: Secondary | ICD-10-CM

## 2014-12-23 ENCOUNTER — Ambulatory Visit (INDEPENDENT_AMBULATORY_CARE_PROVIDER_SITE_OTHER): Payer: Self-pay | Admitting: Thoracic Surgery (Cardiothoracic Vascular Surgery)

## 2014-12-23 ENCOUNTER — Ambulatory Visit
Admission: RE | Admit: 2014-12-23 | Discharge: 2014-12-23 | Disposition: A | Discharge status: Home / Self Care | Payer: Commercial Managed Care - HMO | Source: Ambulatory Visit | Attending: Thoracic Surgery (Cardiothoracic Vascular Surgery) | Admitting: Thoracic Surgery (Cardiothoracic Vascular Surgery)

## 2014-12-23 ENCOUNTER — Encounter: Payer: Self-pay | Admitting: Thoracic Surgery (Cardiothoracic Vascular Surgery)

## 2014-12-23 DIAGNOSIS — J90 Pleural effusion, not elsewhere classified: Secondary | ICD-10-CM

## 2014-12-23 DIAGNOSIS — J948 Other specified pleural conditions: Secondary | ICD-10-CM

## 2014-12-23 DIAGNOSIS — R0602 Shortness of breath: Secondary | ICD-10-CM | POA: Diagnosis not present

## 2014-12-23 NOTE — Progress Notes (Signed)
[address]       [place] [address]             [phone]       HPI: Mr. Stephens returns today for a scheduled postoperative follow-up visit.  He is a 69 year old man who had a chronic recurring right pleural effusion. He underwent a right assisted thoracoscopy, drainage of pleural effusion, biopsies, and talc pleurodesis on 11/27/2014. He initially had high chest tube output and required a second talc via the chest tubes on 12/02/2014. Ultimately after starting him on Lasix 40 mg daily his drainage decreased and we were able to get his tube out. He was discharged on 12/08/2014.  He says that he feels well. He's been taking Tylenol for pain. He has not needed oxycodone. He says he still gets short of breath with exertion but his breathing capacity is improved from preop.  Past Medical History  Diagnosis Date  . Hyperlipidemia   . HTN (hypertension)   . Allergic rhinitis   . Prostate hypertrophy   . Anxiety   . CAD (coronary artery disease)     a. s/p 3v CABG 2010. b. NSTEMI 05/2014 in setting of VT. Cath impressions: "Recent IMI with occluded SVG to RCA. Has Right to Right collaterals and collaterals from septal and OM. EF 40%."  . TIA (transient ischemic attack)     ASPVD, S./P. right CEA, 2002, and known occluded left carotid  . 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 Ischemic cardiomyopathy     a. Prior EF 36%. b. 2014: 50%. c. 05/2014: EF 35-40% by echo, 40% with inf HK by cath.  . Carotid disease, bilateral     a. Right CEA 2002; known occluded left carotid. b. Duplex 06/2014: known occluded LICA, stable 3-79% RICA s/p CEA.  . Depression   . PVC's (premature ventricular contractions)     a. PVC's with prior Holter showing PVC load of 22%.  . 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 Pneumonia 05/23/2014  . Ventricular tachycardia     a. Admitted with VT 05/2013 - EPS with inducible sustained monomorphic VT; s/p Medtronic ICD 05/21/14.  .  Emphysema of lung   . NSTEMI (non-ST elevation myocardial infarction) 05/2014  . Pleural effusion 05/2014  . Chronic systolic CHF (congestive heart failure)   . C. difficile colitis 06/16/2014  . AICD (automatic cardioverter/defibrillator) present   . Presence of permanent cardiac pacemaker   . Stroke 01  . Shortness of breath dyspnea   . Blood dyscrasia     cmleukemia      Current Outpatient Prescriptions  Medication Sig Dispense Refill  . aspirin 81 MG chewable tablet Chew 1 tablet (81 mg total) by mouth daily.    Marland Kitchen atorvastatin (LIPITOR) 80 MG tablet TAKE 1 TABLET EVERY DAY  AT  6:00  PM 90 tablet 0  . carvedilol (COREG) 6.25 MG tablet Take 1 tablet (6.25 mg total) by mouth 2 (two) times daily. 60 tablet 1  . clopidogrel (PLAVIX) 75 MG tablet Take 1 tablet (75 mg total) by mouth daily. 30 tablet 0  . dasatinib (SPRYCEL) 100 MG tablet Take 100 mg by mouth daily.    . furosemide (LASIX) 40 MG tablet Take 1 tablet (40 mg total) by mouth daily. (Patient taking differently: Take 40 mg by mouth daily. Takes as needed) 30 tablet 1  . guaiFENesin (MUCINEX) 600 MG 12 hr tablet Take 1200 mg by mouth 2-3 times a day as  needed for cough or to loosen phlegm    . lisinopril (PRINIVIL,ZESTRIL) 5 MG tablet Take 2.5 mg by mouth daily.    . nitroGLYCERIN (NITROSTAT) 0.4 MG SL tablet Place 1 tablet (0.4 mg total) under the tongue every 5 (five) minutes as needed for chest pain. 25 tablet 4  . potassium chloride SA (K-DUR,KLOR-CON) 20 MEQ tablet Take 20 mEq by mouth daily.    Marland Kitchen saccharomyces boulardii (FLORASTOR) 250 MG capsule Take 1 capsule (250 mg total) by mouth 2 (two) times daily. 60 capsule 1   No current facility-administered medications for this visit.    Physical Exam BP 143/74 mmHg  Pulse 88  Resp 18  Ht 5\' 9"  (1.753 m)  Wt 187 lb (84.823 kg)  BMI 27.60 kg/m2  SpO50 5% 69 year old man in no acute distress Alert and oriented 3 with no focal deficits Cardiac regular rate and rhythm  normal S1 and S2 Lungs clear with equal breath says bilaterally Incision healing well, chest tube sites with some local irritation after suture removal, no sign of infection.  Diagnostic Tests:  Chest x-ray was reviewed and is unchanged from his last film prior to discharge. There is some fluid loculated in the fissure. Overall much improved from his pre-chest x-ray.  Impression: 69 yo man who had a right VATS, drainage of the pleural effusion and talc pleurodesis on 11/27/2014. All the biopsies came back benign. The findings are consistent with a chronic inflammatory exudative effusion.  His chest x-ray today is unchanged from the one from 2 weeks ago prior to discharge. I do think to be on the safe side we should check another chest x-ray on him in about a month to make sure there is no recurrent fluid. If the effusion were to recur consideration could be given to a Pleurx catheter.  At this point there are no restrictions on his activities. He was advised to build into new activities gradually to avoid undue discomfort.  Plan: Return in 4 weeks with chest x-ray  [name], MD [facility] [phone]

## 2014-12-24 ENCOUNTER — Ambulatory Visit: Payer: Commercial Managed Care - HMO | Admitting: Pulmonary Disease

## 2014-12-26 LAB — FUNGUS CULTURE W SMEAR: Fungal Smear: NONE SEEN

## 2014-12-29 ENCOUNTER — Encounter: Payer: Self-pay | Admitting: Cardiology

## 2014-12-29 ENCOUNTER — Ambulatory Visit (INDEPENDENT_AMBULATORY_CARE_PROVIDER_SITE_OTHER): Payer: Commercial Managed Care - HMO | Admitting: Cardiology

## 2014-12-29 VITALS — BP 132/60 | HR 82 | Ht 69.0 in | Wt 180.0 lb

## 2014-12-29 DIAGNOSIS — I493 Ventricular premature depolarization: Secondary | ICD-10-CM | POA: Diagnosis not present

## 2014-12-29 DIAGNOSIS — I739 Peripheral vascular disease, unspecified: Secondary | ICD-10-CM

## 2014-12-29 DIAGNOSIS — I2510 Atherosclerotic heart disease of native coronary artery without angina pectoris: Secondary | ICD-10-CM | POA: Diagnosis not present

## 2014-12-29 DIAGNOSIS — E785 Hyperlipidemia, unspecified: Secondary | ICD-10-CM

## 2014-12-29 DIAGNOSIS — I10 Essential (primary) hypertension: Secondary | ICD-10-CM

## 2014-12-29 DIAGNOSIS — I779 Disorder of arteries and arterioles, unspecified: Secondary | ICD-10-CM

## 2014-12-29 DIAGNOSIS — I472 Ventricular tachycardia, unspecified: Secondary | ICD-10-CM

## 2014-12-29 DIAGNOSIS — I255 Ischemic cardiomyopathy: Secondary | ICD-10-CM

## 2014-12-29 NOTE — Patient Instructions (Signed)
Medication Instructions:  Your physician recommends that you continue on your current medications as directed. Please refer to the Current Medication list given to you today.   Labwork: Your physician recommends that you return for FASTING lab work [place], May 24. You may come ANY TIME between 7:30 AM and 5:00 PM.  Testing/Procedures: None  Follow-Up: Your physician wants you to follow-up in: 6 months with Dr. Radford Pax. You will receive a reminder letter in the mail two months in advance. If you don't receive a letter, please call our office to schedule the follow-up appointment.   Any Other Special Instructions Will Be Listed Below (If Applicable).

## 2014-12-29 NOTE — Progress Notes (Signed)
Cardiology Office Note   Date:  12/29/2014   ID:  Anthony Wolfe, DOB [birth year], MRN [id number]  PCP:  Odette Fraction, MD  Cardiologist:   Sueanne Margarita, MD   Chief Complaint  Patient presents with  . Follow-up    PVC's      History of Present Illness: This is a 69 year old man with a complex medical history including coronary artery disease, status post CABG in 2010, non-ST elevation MI with ventricular tachycardia in 2015, ICD placement, ischemic cardiomyopathy, congestive heart failure, bilateral carotid artery disease, hypertension, hyperlipidemia, anxiety, depression, pneumonia, COPD, and C. difficile colitis. 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  He first developed a right pleural effusion back in October 2015. This was in the setting of congestive heart failure and pneumonia. He was treated with antibiotics which improved his pneumonia but resulted in a severe case of C. difficile colitis. He said he was having difficulties with that for about 3 months before finally resolved.  He had recurrent pleural effusion and underwent VATS with talc pleurodesis.  He now presents today for followup.  He is doing well.  He denies any chest pain, dizziness, palpitations or syncope.  He still has SOB and DOE but thinks that it has improved since his surgery.  He denies any LE edema.      Past Medical History  Diagnosis Date  . Hyperlipidemia   . HTN (hypertension)   . Allergic rhinitis   . Prostate hypertrophy   . Anxiety   . CAD (coronary artery disease)     a. s/p 3v CABG 2010. b. NSTEMI 05/2014 in setting of VT. Cath impressions: "Recent IMI with occluded SVG to RCA. Has Right to Right collaterals and collaterals from septal and OM. EF 40%."  . TIA (transient ischemic attack)     ASPVD, S./P. right CEA, 2002, and known occluded left carotid  . 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 Ischemic cardiomyopathy     a. Prior EF 36%. b. 2014: 50%. c.  05/2014: EF 35-40% by echo, 40% with inf HK by cath.  . Carotid disease, bilateral     a. Right CEA 2002; known occluded left carotid. b. Duplex 06/2014: known occluded LICA, stable 0-25% RICA s/p CEA.  . Depression   . PVC's (premature ventricular contractions)     a. PVC's with prior Holter showing PVC load of 22%.  . 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 Pneumonia 05/23/2014  . Ventricular tachycardia     a. Admitted with VT 05/2013 - EPS with inducible sustained monomorphic VT; s/p Medtronic ICD 05/21/14.  . Emphysema of lung   . NSTEMI (non-ST elevation myocardial infarction) 05/2014  . Pleural effusion 05/2014  . Chronic systolic CHF (congestive heart failure)   . C. difficile colitis 06/16/2014  . AICD (automatic cardioverter/defibrillator) present   . Presence of permanent cardiac pacemaker   . Stroke 01  . Shortness of breath dyspnea   . Blood dyscrasia     cmleukemia    Past Surgical History  Procedure Laterality Date  . Coronary artery bypass graft  02/2009    3 vessel  . Colonoscopy  07/2010  . Carotidectomy  2003    right side  . Back surgery    . Electrophysiologic study  05/21/14  . Ep implantable device  05/21/14    single chamber Metronic ICD  . Left heart catheterization with coronary/graft angiogram N/A 05/19/2014    Procedure: LEFT HEART CATHETERIZATION WITH CORONARY/GRAFT ANGIOGRAM;  Surgeon: Josue Hector, MD;  Location[facility] C[facility] LAB;  Service: Cardiovascular;  Laterality: N/A;  . Electrophysiology study N/A 05/21/2014    Procedure: ELECTROPHYSIOLOGY STUDY;  Surgeon: Deboraha Sprang, MD;  Location: [facility] CATH LAB;  Service: Cardiovascular;  Laterality: N/A;  . Carotid endarterectomy Right 01  . Video assisted thoracoscopy Right 11/27/2014    Procedure: RIGHT VIDEO ASSISTED THORACOSCOPY;  Surgeon: [name], MD;  Location: [place];  Service: Thoracic;  Laterality: Right;  . Pleural effusion drainage Right 11/27/2014    Procedure: DRAINAGE OF PLEURAL  EFFUSION;  Surgeon: [name], MD;  Location: [place];  Service: Thoracic;  Laterality: Right;  . Pleural biopsy N/A 11/27/2014    Procedure: PLEURAL BIOPSY;  Surgeon: [name], MD;  Location: [place];  Service: Thoracic;  Laterality: N/A;  . Talc pleurodesis Right 11/27/2014    Procedure: Pietro Cassis;  Surgeon: [name], MD;  Location: [facility] OR;  Service: Thoracic;  Laterality: Right;     Current Outpatient Prescriptions  Medication Sig Dispense Refill  . aspirin 81 MG chewable tablet Chew 1 tablet (81 mg total) by mouth daily.    Marland Kitchen atorvastatin (LIPITOR) 80 MG tablet TAKE 1 TABLET EVERY DAY  AT  6:00  PM 90 tablet 0  . carvedilol (COREG) 6.25 MG tablet Take 1 tablet (6.25 mg total) by mouth 2 (two) times daily. 60 tablet 1  . clopidogrel (PLAVIX) 75 MG tablet Take 1 tablet (75 mg total) by mouth daily. 30 tablet 0  . dasatinib (SPRYCEL) 100 MG tablet Take 100 mg by mouth daily.    . furosemide (LASIX) 40 MG tablet Take 1 tablet (40 mg total) by mouth daily. (Patient taking differently: Take 40 mg by mouth daily. Takes as needed) 30 tablet 1  . guaiFENesin (MUCINEX) 600 MG 12 hr tablet Take 1200 mg by mouth 2-3 times a day as needed for cough or to loosen phlegm    . lisinopril (PRINIVIL,ZESTRIL) 5 MG tablet Take 2.5 mg by mouth daily.    . nitroGLYCERIN (NITROSTAT) 0.4 MG SL tablet Place 1 tablet (0.4 mg total) under the tongue every 5 (five) minutes as needed for chest pain. 25 tablet 4  . potassium chloride SA (K-DUR,KLOR-CON) 20 MEQ tablet Take 20 mEq by mouth daily.    Marland Kitchen saccharomyces boulardii (FLORASTOR) 250 MG capsule Take 1 capsule (250 mg total) by mouth 2 (two) times daily. 60 capsule 1   No current facility-administered medications for this visit.    Allergies:   Norvasc and Sulfa antibiotics    Social History:  The patient  reports that he quit smoking about 17 years ago. His smoking use included Cigarettes. He has a 135 pack-year smoking  history. He has quit using smokeless tobacco. He reports that he does not drink alcohol or use illicit drugs.   Family History:  The patient's family history includes Alcohol abuse in his father; Heart disease in his mother.    ROS:  Please see the history of present illness.   Otherwise, review of systems are positive for none.   All other systems are reviewed and negative.    PHYSICAL EXAM: VS:  Ht 5\' 9"  (1.753 m) , BMI There is no weight on file to calculate BMI. GEN: Well nourished, well developed, in no acute distress HEENT: normal Neck: no JVD,  or masses.  Right carotid artery bruit Cardiac: RRR; no murmurs, rubs, or gallops,no edema  Respiratory:  clear to auscultation bilaterally, normal  work of breathing GI: soft, nontender, nondistended, + BS MS: no deformity or atrophy Skin: warm and dry, no rash Neuro:  Strength and sensation are intact Psych: euthymic mood, full affect   EKG:  EKG is not ordered today.    Recent Labs: 05/16/2014: Magnesium 2.1 06/02/2014: TSH 2.03 06/09/2014: Pro B Natriuretic peptide (BNP) 2643.0* 11/29/2014: ALT 16 12/03/2014: B Natriuretic Peptide 247.4* 12/05/2014: Hemoglobin 11.1*; Platelets 221 12/06/2014: BUN 14; Creatinine 1.16; Potassium 3.4*; Sodium 136    Lipid Panel    Component Value Date/Time   CHOL 117 08/12/2013 1533   TRIG 114 08/12/2013 1533   HDL 27* 08/12/2013 1533   CHOLHDL 4.3 08/12/2013 1533   VLDL 23 08/12/2013 1533   LDLCALC 67 08/12/2013 1533      Wt Readings from Last 3 Encounters:  12/23/14 187 lb (84.823 kg)  11/27/14 184 lb (83.462 kg)  11/19/14 187 lb (84.823 kg)    ASSESSMENT AND PLAN:  1. Symptomatic PVC's resolved on BB 2. ASCAD with occluded SVG to RCA at cath 05/2014 with no angina.  Continue ASA/Plavix 3. Ischemic DCM EF 40% on ACE I/BB/Lasix.  No evidence of volume overload on exam today. 4. HTN - well controlled on BB/ACE I 5. Ventricular tachycardia s/p AICD 05/2014 6. Dyslipidemia - recheck  FLP and ALT.  Continue Lipitor   7.       Carotid artery stenosis - Stable 1-39% RICA stenosis, s/p CEA with patch DPA. Known occlusion LICA. -> this is chronic.  Repeat scan in 2 years  Current medicines are reviewed at length with the patient today.  The patient does not have concerns regarding medicines.  The following changes have been made:  no change  Labs/ tests ordered today include: FLp and ALT  No orders of the defined types were placed in this encounter.     Disposition:   FU with me in 6 months  Signed, Sueanne Margarita, MD  12/29/2014 9:46 AM    [facility] [place], [place], [place]  [address] Phone: [phone]; Fax: [phone]

## 2014-12-30 ENCOUNTER — Other Ambulatory Visit: Payer: Commercial Managed Care - HMO

## 2015-01-01 ENCOUNTER — Other Ambulatory Visit: Payer: Self-pay

## 2015-01-01 ENCOUNTER — Encounter: Payer: Self-pay | Admitting: Internal Medicine

## 2015-01-01 DIAGNOSIS — I5041 Acute combined systolic (congestive) and diastolic (congestive) heart failure: Secondary | ICD-10-CM

## 2015-01-01 DIAGNOSIS — J441 Chronic obstructive pulmonary disease with (acute) exacerbation: Secondary | ICD-10-CM

## 2015-01-01 NOTE — Patient Outreach (Signed)
[name]) Care Management  01/01/2015  Anthony Wolfe 1945/11/04 [id number]   RN CM spoke with patient to discuss the services of [facility].  Patient has an extensive cardiac history.  Patient recently underwent a VATS with talc pleurodesis on 11-27-14.  Patient states he is doing much better.  States his he lost a lot of weight recently, however his appetite is improving.  States his weight is up to 180 lbs.  Reports recent visit to his cardiologist, Dr. Radford Pax went well and he is to return in 6 months.  Patient states he takes his blood pressure and he weighs himself often.  Patient states he was getting monthly calls from the nurse at [facility].  States the calls were helpful and he would like to continue the calls with a Health Coach from [facility].  Patient agrees to the services and is agreeable to scheduling an appointment with a Health Coach.  RN CM will make a referral for Health Coach for disease management of patient's chronic conditions.   [name], RN, [place] Holter, [place] Telephonic Care Coordinator [phone]

## 2015-01-01 NOTE — Patient Outreach (Signed)
[place] [facility]) Care Management  01/01/2015  Anthony Wolfe [birth year] [id number]   Notification from [name], RN to refer to Health Coach, assigned [name], RN.  [name]. [place], [place] Management [place] Assistant Phone: [phone] Fax: [phone]

## 2015-01-02 ENCOUNTER — Other Ambulatory Visit: Payer: Self-pay

## 2015-01-02 NOTE — Patient Outreach (Addendum)
[facility]) Care Management  01/02/2015  Anthony Wolfe 24-Dec-1945 [id number]   Telephone call to patient for initial health coach outreach.  Patient states he thought about it he has changed his mind about having [place] [facility] services. He states he has enough doctors that he does not feel he needs nurse calls.  Advised patient I would close services and let PCP know.  He verbalized understanding.    Plan: RNCM will forward patient information to Lurline Del for case closure.   RNCM will send letter to primary care physician notifying of case closure.  Jone Baseman, RN, MSN [place] [phone]

## 2015-01-02 NOTE — Patient Outreach (Signed)
[facility] [facility]) Care Management  01/02/2015  Anthony Wolfe August 12, 1945 [id number]   Telephone call to [place] Nurse Hettie Holstein to notify her of patient status with [facility] Care Management.   Left message informing her of patient refusal of services.     Jone Baseman, RN, MSN [place] [phone]

## 2015-01-05 ENCOUNTER — Other Ambulatory Visit: Payer: Self-pay | Admitting: Family Medicine

## 2015-01-08 NOTE — Patient Outreach (Signed)
[facility]) Care Management  01/08/2015  ALERIC FROELICH [birth year] [id number]   Notification from [name], RN to close case as patient refused to participate.  [name]. [place], [place] Management [place] Assistant Phone: [phone] Fax: [phone]

## 2015-01-09 LAB — AFB CULTURE WITH SMEAR (NOT AT ARMC): Acid Fast Smear: NONE SEEN

## 2015-01-13 ENCOUNTER — Ambulatory Visit: Payer: Commercial Managed Care - HMO | Admitting: Cardiology

## 2015-01-14 ENCOUNTER — Encounter: Payer: Self-pay | Admitting: Cardiology

## 2015-01-26 ENCOUNTER — Other Ambulatory Visit: Payer: Self-pay | Admitting: Thoracic Surgery (Cardiothoracic Vascular Surgery)

## 2015-01-26 DIAGNOSIS — J90 Pleural effusion, not elsewhere classified: Secondary | ICD-10-CM

## 2015-01-27 ENCOUNTER — Ambulatory Visit: Payer: Self-pay | Admitting: Thoracic Surgery (Cardiothoracic Vascular Surgery)

## 2015-01-27 ENCOUNTER — Ambulatory Visit (INDEPENDENT_AMBULATORY_CARE_PROVIDER_SITE_OTHER): Payer: Self-pay | Admitting: Thoracic Surgery (Cardiothoracic Vascular Surgery)

## 2015-01-27 ENCOUNTER — Encounter: Payer: Self-pay | Admitting: Thoracic Surgery (Cardiothoracic Vascular Surgery)

## 2015-01-27 ENCOUNTER — Ambulatory Visit
Admission: RE | Admit: 2015-01-27 | Discharge: 2015-01-27 | Disposition: A | Discharge status: Home / Self Care | Payer: Commercial Managed Care - HMO | Source: Ambulatory Visit | Attending: Thoracic Surgery (Cardiothoracic Vascular Surgery) | Admitting: Thoracic Surgery (Cardiothoracic Vascular Surgery)

## 2015-01-27 VITALS — BP 150/71 | HR 74 | Resp 20 | Ht 69.0 in | Wt 182.0 lb

## 2015-01-27 DIAGNOSIS — R0602 Shortness of breath: Secondary | ICD-10-CM | POA: Diagnosis not present

## 2015-01-27 DIAGNOSIS — J948 Other specified pleural conditions: Secondary | ICD-10-CM

## 2015-01-27 DIAGNOSIS — J90 Pleural effusion, not elsewhere classified: Secondary | ICD-10-CM

## 2015-01-27 NOTE — Progress Notes (Signed)
[address]       [place] [address]             [phone]       HPI:  Mr. Orrego returns for a scheduled follow up visit.  He is a 69 year old man who had a chronic recurring right pleural effusion. He underwent a right assisted thoracoscopy, drainage of pleural effusion, biopsies, and talc pleurodesis on 11/27/2014. He initially had high chest tube output and required a second talc via the chest tubes on 12/02/2014. Ultimately after starting him on Lasix 40 mg daily his drainage decreased and we were able to get his tube out. He was discharged on 12/08/2014.  I saw him on 5/17. He was making good progress at that time but his chest x-ray showed some loculated fluid in the right chest. He now returns for follow-up of that  In the interim since last visit he says his continued to make progress. He doesn't think he has any fluid in his chest. He is having minimal incisional discomfort. His shortness of breath is about the same as it was last visit, improved from preoperatively. He is not taking any pain medication.  Past Medical History  Diagnosis Date  . Hyperlipidemia   . HTN (hypertension)   . Allergic rhinitis   . Prostate hypertrophy   . Anxiety   . CAD (coronary artery disease)     a. s/p 3v CABG 2010. b. NSTEMI 05/2014 in setting of VT. Cath impressions: "Recent IMI with occluded SVG to RCA. Has Right to Right collaterals and collaterals from septal and OM. EF 40%."  . TIA (transient ischemic attack)     ASPVD, S./P. right CEA, 2002, and known occluded left carotid  . 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 Ischemic cardiomyopathy     a. Prior EF 36%. b. 2014: 50%. c. 05/2014: EF 35-40% by echo, 40% with inf HK by cath.  . Carotid disease, bilateral     a. Right CEA 2002; known occluded left carotid. b. Duplex 06/2014: known occluded LICA, stable 4-33% RICA s/p CEA.  . Depression   . PVC's (premature ventricular contractions)     a. PVC's  with prior Holter showing PVC load of 22%.  . 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 Pneumonia 05/23/2014  . Ventricular tachycardia     a. Admitted with VT 05/2013 - EPS with inducible sustained monomorphic VT; s/p Medtronic ICD 05/21/14.  . Emphysema of lung   . NSTEMI (non-ST elevation myocardial infarction) 05/2014  . Pleural effusion 05/2014  . Chronic systolic CHF (congestive heart failure)   . C. difficile colitis 06/16/2014  . AICD (automatic cardioverter/defibrillator) present   . Presence of permanent cardiac pacemaker   . Stroke 01  . Shortness of breath dyspnea   . Blood dyscrasia     cmleukemia     Current Outpatient Prescriptions  Medication Sig Dispense Refill  . aspirin 81 MG chewable tablet Chew 1 tablet (81 mg total) by mouth daily.    Marland Kitchen atorvastatin (LIPITOR) 80 MG tablet TAKE 1 TABLET EVERY DAY  AT  6:00  PM 90 tablet 0  . carvedilol (COREG) 6.25 MG tablet Take 1 tablet (6.25 mg total) by mouth 2 (two) times daily. 60 tablet 1  . clopidogrel (PLAVIX) 75 MG tablet Take 1 tablet (75 mg total) by mouth daily. 30 tablet 0  . dasatinib (SPRYCEL) 100 MG tablet Take 100 mg by mouth daily.    Marland Kitchen  furosemide (LASIX) 40 MG tablet Take 1 tablet (40 mg total) by mouth daily. (Patient taking differently: Take 40 mg by mouth daily. Takes as needed) 30 tablet 1  . guaiFENesin (MUCINEX) 600 MG 12 hr tablet Take 1200 mg by mouth 2-3 times a day as needed for cough or to loosen phlegm    . lisinopril (PRINIVIL,ZESTRIL) 2.5 MG tablet Take 2.5 mg by mouth daily.    . mirtazapine (REMERON) 30 MG tablet TAKE ONE TABLET BY MOUTH AT BEDTIME 30 tablet 11  . nitroGLYCERIN (NITROSTAT) 0.4 MG SL tablet Place 1 tablet (0.4 mg total) under the tongue every 5 (five) minutes as needed for chest pain. 25 tablet 4  . potassium chloride SA (K-DUR,KLOR-CON) 20 MEQ tablet Take 20 mEq by mouth daily.     No current facility-administered medications for this visit.    Physical Exam BP 150/71 mmHg   Pulse 74  Resp 20  Ht 5\' 9"  (1.753 m)  Wt 182 lb (82.555 kg)  BMI 26.86 kg/m2  SpO59 29% 69 year old man in no acute distress Well-developed well-nourished Alert and oriented 3 with no focal deficit Lungs clear with equal breath sounds bilaterally Incisions well healed  Diagnostic Tests: Chest x-ray reviewed. It shows persistent loculated fluid likely in one of the fissures in the right chest.  Impression: 68 year old man who is now 2 months postop from right VATS, drainage of effusion and talc pleurodesis. He is recovering well from the surgery. He does have some residual loculated fluid in the fissure that is not causing any clinical issues. He's had no recurrence of his primary effusion.  Pathology showed no evidence of malignancy.  Plan: Follow-up with Dr. Dennard Schaumann and Dr. Radford Pax.  I will be happy to see him back again at any time if I can be of any further assistance with his care.  [name], MD [facility] [phone]

## 2015-01-29 ENCOUNTER — Encounter: Payer: Self-pay | Admitting: Family Medicine

## 2015-01-29 ENCOUNTER — Ambulatory Visit (INDEPENDENT_AMBULATORY_CARE_PROVIDER_SITE_OTHER): Payer: Commercial Managed Care - HMO | Admitting: Family Medicine

## 2015-01-29 ENCOUNTER — Other Ambulatory Visit (HOSPITAL_BASED_OUTPATIENT_CLINIC_OR_DEPARTMENT_OTHER): Payer: Commercial Managed Care - HMO

## 2015-01-29 VITALS — BP 110/60 | HR 78 | Temp 98.5°F | Resp 22 | Ht 69.0 in | Wt 181.0 lb

## 2015-01-29 DIAGNOSIS — C9210 Chronic myeloid leukemia, BCR/ABL-positive, not having achieved remission: Secondary | ICD-10-CM

## 2015-01-29 DIAGNOSIS — C9290 Myeloid leukemia, unspecified, not having achieved remission: Secondary | ICD-10-CM | POA: Diagnosis not present

## 2015-01-29 DIAGNOSIS — Z0000 Encounter for general adult medical examination without abnormal findings: Secondary | ICD-10-CM | POA: Diagnosis not present

## 2015-01-29 DIAGNOSIS — Z23 Encounter for immunization: Secondary | ICD-10-CM

## 2015-01-29 LAB — CBC WITH DIFFERENTIAL/PLATELET
BASO%: 0.5 % (ref 0.0–2.0)
Basophils Absolute: 0 10*3/uL (ref 0.0–0.1)
EOS%: 2.1 % (ref 0.0–7.0)
Eosinophils Absolute: 0.1 10*3/uL (ref 0.0–0.5)
HCT: 32.5 % — ABNORMAL LOW (ref 38.4–49.9)
HGB: 10.5 g/dL — ABNORMAL LOW (ref 13.0–17.1)
LYMPH%: 23.2 % (ref 14.0–49.0)
MCH: 32.4 pg (ref 27.2–33.4)
MCHC: 32.3 g/dL (ref 32.0–36.0)
MCV: 100.3 fL — ABNORMAL HIGH (ref 79.3–98.0)
MONO#: 0.7 10*3/uL (ref 0.1–0.9)
MONO%: 11.2 % (ref 0.0–14.0)
NEUT%: 63 % (ref 39.0–75.0)
NEUTROS ABS: 3.9 10*3/uL (ref 1.5–6.5)
PLATELETS: 159 10*3/uL (ref 140–400)
RBC: 3.24 10*6/uL — ABNORMAL LOW (ref 4.20–5.82)
RDW: 15.7 % — AB (ref 11.0–14.6)
WBC: 6.2 10*3/uL (ref 4.0–10.3)
lymph#: 1.5 10*3/uL (ref 0.9–3.3)

## 2015-01-29 LAB — COMPREHENSIVE METABOLIC PANEL (CC13)
ALT: 23 U/L (ref 0–55)
ANION GAP: 6 meq/L (ref 3–11)
AST: 22 U/L (ref 5–34)
Albumin: 3.1 g/dL — ABNORMAL LOW (ref 3.5–5.0)
Alkaline Phosphatase: 91 U/L (ref 40–150)
BUN: 14 mg/dL (ref 7.0–26.0)
CALCIUM: 8.6 mg/dL (ref 8.4–10.4)
CHLORIDE: 113 meq/L — AB (ref 98–109)
CO2: 24 meq/L (ref 22–29)
CREATININE: 0.8 mg/dL (ref 0.7–1.3)
EGFR: 89 mL/min/{1.73_m2} — ABNORMAL LOW (ref >90)
Glucose: 115 mg/dl (ref 70–140)
POTASSIUM: 4 meq/L (ref 3.5–5.1)
SODIUM: 143 meq/L (ref 136–145)
Total Bilirubin: 0.42 mg/dL (ref 0.20–1.20)
Total Protein: 6 g/dL — ABNORMAL LOW (ref 6.4–8.3)

## 2015-01-29 LAB — LACTATE DEHYDROGENASE (CC13): LDH: 107 U/L — AB (ref 125–245)

## 2015-01-29 MED ORDER — FUROSEMIDE 20 MG PO TABS: 20.0000 mg | ORAL_TABLET | Freq: Every day | ORAL | Status: DC

## 2015-01-29 NOTE — Addendum Note (Signed)
Addended by: [name] on: 01/29/2015 04:29 PM   Modules accepted: Orders

## 2015-01-29 NOTE — Addendum Note (Signed)
Addended by: [name] on: 01/29/2015 04:14 PM   Modules accepted: Orders

## 2015-01-29 NOTE — Progress Notes (Signed)
Subjective:    Patient ID: Anthony Wolfe, male    DOB: 08-01-1946, 69 y.o.   MRN: [id number]  HPI Has had complicated year with CAD, s/p MI, recurrent C. Diff, and recurrent right pleural effusions.  End of April and early May, underwent VATS and talc pleurodesis x 2.  Had persistent loculated fluid in the right fissure which was not clinically significant  On follow up CXR with Dr. Roxan Hockey.  Has been released.  Here for CPE.  Had pneumovax 23 last year.  Due for prevnar 13 today.    Past Medical History  Diagnosis Date  . Hyperlipidemia   . HTN (hypertension)   . Allergic rhinitis   . Prostate hypertrophy   . Anxiety   . CAD (coronary artery disease)     a. s/p 3v CABG 2010. b. NSTEMI 05/2014 in setting of VT. Cath impressions: "Recent IMI with occluded SVG to RCA. Has Right to Right collaterals and collaterals from septal and OM. EF 40%."  . TIA (transient ischemic attack)     ASPVD, S./P. right CEA, 2002, and known occluded left carotid  . 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 Ischemic cardiomyopathy     a. Prior EF 36%. b. 2014: 50%. c. 05/2014: EF 35-40% by echo, 40% with inf HK by cath.  . Carotid disease, bilateral     a. Right CEA 2002; known occluded left carotid. b. Duplex 06/2014: known occluded LICA, stable 7-41% RICA s/p CEA.  . Depression   . PVC's (premature ventricular contractions)     a. PVC's with prior Holter showing PVC load of 22%.  . 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 Pneumonia 05/23/2014  . Ventricular tachycardia     a. Admitted with VT 05/2013 - EPS with inducible sustained monomorphic VT; s/p Medtronic ICD 05/21/14.  . Emphysema of lung   . NSTEMI (non-ST elevation myocardial infarction) 05/2014  . Pleural effusion 05/2014  . Chronic systolic CHF (congestive heart failure)   . C. difficile colitis 06/16/2014  . AICD (automatic cardioverter/defibrillator) present   . Presence of permanent cardiac pacemaker   . Stroke 01  .  Shortness of breath dyspnea   . Blood dyscrasia     cmleukemia   Past Surgical History  Procedure Laterality Date  . Coronary artery bypass graft  02/2009    3 vessel  . Colonoscopy  07/2010  . Carotidectomy  2003    right side  . Back surgery    . Electrophysiologic study  05/21/14  . Ep implantable device  05/21/14    single chamber Metronic ICD  . Left heart catheterization with coronary/graft angiogram N/A 05/19/2014    Procedure: LEFT HEART CATHETERIZATION WITH Beatrix Fetters;  Surgeon: Josue Hector, MD;  Location: [facility] CATH LAB;  Service: Cardiovascular;  Laterality: N/A;  . Electrophysiology study N/A 05/21/2014    Procedure: ELECTROPHYSIOLOGY STUDY;  Surgeon: Deboraha Sprang, MD;  Location: [facility] CATH LAB;  Service: Cardiovascular;  Laterality: N/A;  . Carotid endarterectomy Right 01  . Video assisted thoracoscopy Right 11/27/2014    Procedure: RIGHT VIDEO ASSISTED THORACOSCOPY;  Surgeon: [name], MD;  Location: [place];  Service: Thoracic;  Laterality: Right;  . Pleural effusion drainage Right 11/27/2014    Procedure: DRAINAGE OF PLEURAL EFFUSION;  Surgeon: [name], MD;  Location: [place];  Service: Thoracic;  Laterality: Right;  . Pleural biopsy N/A 11/27/2014    Procedure: PLEURAL BIOPSY;  Surgeon: [name], MD;  Location:  [facility] OR;  Service: Thoracic;  Laterality: N/A;  . Talc pleurodesis Right 11/27/2014    Procedure: Pietro Cassis;  Surgeon: [name], MD;  Location: [facility];  Service: Thoracic;  Laterality: Right;   Current Outpatient Prescriptions on File Prior to Visit  Medication Sig Dispense Refill  . aspirin 81 MG chewable tablet Chew 1 tablet (81 mg total) by mouth daily.    Marland Kitchen atorvastatin (LIPITOR) 80 MG tablet TAKE 1 TABLET EVERY DAY  AT  6:00  PM 90 tablet 0  . carvedilol (COREG) 6.25 MG tablet Take 1 tablet (6.25 mg total) by mouth 2 (two) times daily. 60 tablet 1  . clopidogrel (PLAVIX) 75 MG tablet Take 1 tablet  (75 mg total) by mouth daily. 30 tablet 0  . dasatinib (SPRYCEL) 100 MG tablet Take 100 mg by mouth daily.    . furosemide (LASIX) 40 MG tablet Take 1 tablet (40 mg total) by mouth daily. (Patient taking differently: Take 20 mg by mouth daily. Takes as needed) 30 tablet 1  . guaiFENesin (MUCINEX) 600 MG 12 hr tablet Take 1200 mg by mouth 2-3 times a day as needed for cough or to loosen phlegm    . lisinopril (PRINIVIL,ZESTRIL) 2.5 MG tablet Take 2.5 mg by mouth daily.    . mirtazapine (REMERON) 30 MG tablet TAKE ONE TABLET BY MOUTH AT BEDTIME 30 tablet 11  . nitroGLYCERIN (NITROSTAT) 0.4 MG SL tablet Place 1 tablet (0.4 mg total) under the tongue every 5 (five) minutes as needed for chest pain. 25 tablet 4  . potassium chloride SA (K-DUR,KLOR-CON) 20 MEQ tablet Take 20 mEq by mouth daily.     No current facility-administered medications on file prior to visit.   Allergies  Allergen Reactions  . Norvasc [Amlodipine Besylate] Rash    rash  . Sulfa Antibiotics Hives    Hives    History   Social History  . Marital Status: Married    Spouse Name: N/A  . Number of Children: 2  . Years of Education: N/A   Occupational History  .      retired Development worker, [employer]   Social History Main Topics  . Smoking status: Former Smoker -- 3.00 packs/day for 45 years    Types: Cigarettes    Quit date: 08/08/1997  . Smokeless tobacco: Former Systems developer  . Alcohol Use: No  . Drug Use: No  . Sexual Activity: Not on file   Other Topics Concern  . Not on file   Social History Narrative   Family History  Problem Relation Age of Onset  . Heart disease Mother   . Alcohol abuse Father      Review of Systems  All other systems reviewed and are negative.      Objective:   Physical Exam  Constitutional: He is oriented to person, place, and time. He appears well-developed and well-nourished. No distress.  HENT:  Head: Normocephalic and atraumatic.  Right Ear: External ear normal.  Left Ear: External ear  normal.  Nose: Nose normal.  Mouth/Throat: Oropharynx is clear and moist. No oropharyngeal exudate.  Eyes: Conjunctivae and EOM are normal. Pupils are equal, round, and reactive to light. Right eye exhibits no discharge. Left eye exhibits no discharge. No scleral icterus.  Neck: Normal range of motion. Neck supple. No JVD present. No tracheal deviation present. No thyromegaly present.  Cardiovascular: Normal rate, regular rhythm, normal heart sounds and intact distal pulses.  Exam reveals no gallop and no friction rub.   No  murmur heard. Pulmonary/Chest: Effort normal and breath sounds normal. No stridor. No respiratory distress. He has no wheezes. He has no rales. He exhibits no tenderness.  Abdominal: Soft. Bowel sounds are normal. He exhibits no distension and no mass. There is no tenderness. There is no rebound and no guarding.  Musculoskeletal: Normal range of motion. He exhibits no edema or tenderness.  Lymphadenopathy:    He has no cervical adenopathy.  Neurological: He is alert and oriented to person, place, and time. He has normal reflexes. He displays normal reflexes. No cranial nerve deficit. He exhibits normal muscle tone. Coordination normal.  Skin: Skin is warm. No rash noted. He is not diaphoretic. No erythema. No pallor.  Psychiatric: He has a normal mood and affect. His behavior is normal. Judgment and thought content normal.  Vitals reviewed.         Assessment & Plan:  Routine general medical examination at a health care facility  Today is normal. Prostate exam today is normal. I've asked the patient to return fasting for a fasting lipid panel as well as a PSA patient received Prevnar 13 today. The remainder of his immunizations are up-to-date. Colonoscopy is up-to-date. I reviewed his CBC and his CMP recently which were normal. No further changes were made at this time.

## 2015-01-30 ENCOUNTER — Other Ambulatory Visit: Payer: Self-pay

## 2015-01-30 ENCOUNTER — Other Ambulatory Visit: Payer: Self-pay | Admitting: Cardiology

## 2015-01-30 MED ORDER — ATORVASTATIN CALCIUM 80 MG PO TABS: ORAL_TABLET | ORAL | Status: DC

## 2015-01-30 MED ORDER — CLOPIDOGREL BISULFATE 75 MG PO TABS: 75.0000 mg | ORAL_TABLET | Freq: Every day | ORAL | Status: DC

## 2015-02-01 ENCOUNTER — Other Ambulatory Visit: Payer: Self-pay | Admitting: Physician Assistant

## 2015-02-02 ENCOUNTER — Other Ambulatory Visit: Payer: Commercial Managed Care - HMO

## 2015-02-03 ENCOUNTER — Other Ambulatory Visit: Payer: Self-pay | Admitting: Family Medicine

## 2015-02-03 MED ORDER — CLOPIDOGREL BISULFATE 75 MG PO TABS: 75.0000 mg | ORAL_TABLET | Freq: Every day | ORAL | Status: DC

## 2015-02-05 ENCOUNTER — Other Ambulatory Visit: Payer: Self-pay | Admitting: Family Medicine

## 2015-02-05 ENCOUNTER — Telehealth: Payer: Self-pay | Admitting: Hematology and Oncology

## 2015-02-05 ENCOUNTER — Ambulatory Visit (HOSPITAL_BASED_OUTPATIENT_CLINIC_OR_DEPARTMENT_OTHER): Payer: Commercial Managed Care - HMO | Admitting: Hematology and Oncology

## 2015-02-05 ENCOUNTER — Encounter: Payer: Self-pay | Admitting: Hematology and Oncology

## 2015-02-05 VITALS — BP 152/65 | HR 78 | Temp 98.1°F | Resp 18 | Ht 69.0 in | Wt 180.4 lb

## 2015-02-05 DIAGNOSIS — J948 Other specified pleural conditions: Secondary | ICD-10-CM | POA: Diagnosis not present

## 2015-02-05 DIAGNOSIS — D630 Anemia in neoplastic disease: Secondary | ICD-10-CM | POA: Diagnosis not present

## 2015-02-05 DIAGNOSIS — J90 Pleural effusion, not elsewhere classified: Secondary | ICD-10-CM

## 2015-02-05 DIAGNOSIS — C9210 Chronic myeloid leukemia, BCR/ABL-positive, not having achieved remission: Secondary | ICD-10-CM

## 2015-02-05 DIAGNOSIS — C9290 Myeloid leukemia, unspecified, not having achieved remission: Secondary | ICD-10-CM | POA: Diagnosis not present

## 2015-02-05 MED ORDER — ATORVASTATIN CALCIUM 80 MG PO TABS: ORAL_TABLET | ORAL | Status: DC

## 2015-02-05 MED ORDER — CARVEDILOL 6.25 MG PO TABS: 6.2500 mg | ORAL_TABLET | Freq: Two times a day (BID) | ORAL | Status: DC

## 2015-02-05 MED ORDER — LISINOPRIL 2.5 MG PO TABS: 2.5000 mg | ORAL_TABLET | Freq: Every day | ORAL | Status: DC

## 2015-02-05 MED ORDER — DASATINIB 140 MG PO TABS: 140.0000 mg | ORAL_TABLET | Freq: Every day | ORAL | Status: DC

## 2015-02-05 NOTE — Assessment & Plan Note (Signed)
This is likely due to recent treatment. The patient denies recent history of bleeding such as epistaxis, hematuria or hematochezia. He is asymptomatic from the anemia. I will observe for now.  He does not require transfusion now. I will continue the chemotherapy at current dose without dosage adjustment.  If the anemia gets progressive worse in the future, I might have to delay his treatment or adjust the chemotherapy dose.  

## 2015-02-05 NOTE — Telephone Encounter (Signed)
Appointments made and patients wife is aware  anne

## 2015-02-05 NOTE — Telephone Encounter (Signed)
Gave and printed appt sched and avs fo rpt for OCT °

## 2015-02-05 NOTE — Progress Notes (Signed)
[facility] OFFICE PROGRESS NOTE  Patient Care Team: [name], MD as PCP - General (Family Medicine) [name], MD as Attending Physician (Cardiology) [name], MD (Family Medicine) [name], MD (Cardiothoracic Surgery) [name], MD as Attending Physician (Vascular Surgery) [name], MD as Consulting Physician (Hematology and Oncology) Deboraha Sprang, MD as Consulting Physician (Cardiology)  SUMMARY OF ONCOLOGIC HISTORY:   Chronic myelogenous leukemia   05/04/2012 Bone Marrow Biopsy BM confirmed diagnosis of CML in Chronic phase. BCR/ABL by PCR detected abnormalitis with b2a2 & b3a2 subtypes   05/09/2012 - 06/06/2013 Chemotherapy He was started on treatment with Dasatinib 100 mg daily   06/06/2012 Adverse Reaction Dose of medication was reduced to 50 mg daily due to pancytopenia   01/24/2013 Progression Patient was noted to have elevated blood count which and was subsequently found to be noncompliant to treatment. The patient has not been on treatment for several months because his prescription ran out. He was restarted back on treatment   01/16/2014 Progression Bcr/ABL by PCR is worse. Dose of Dasatinib was increased to 100 mg.   02/26/2014 Tumor Marker Blood work for ABL kinase mutation was negative.   10/29/2014 Tumor Marker BCR/ABL b2a2 & b3a2 0.29%, IS 0.1624%, not in MMR yet but improving   01/30/2015 Tumor Marker BCR/ABL b2a2 & b3a2 0.78%, IS 0.4368%, not in MMR     INTERVAL HISTORY: Please see below for problem oriented charting. He returns for follow-up He stated he has been compliant Dyspnea has improved since recent surgery  REVIEW OF SYSTEMS:   Constitutional: Denies fevers, chills or abnormal weight loss Eyes: Denies blurriness of vision Ears, nose, mouth, throat, and face: Denies mucositis or sore throat Respiratory: Denies cough, dyspnea or wheezes Cardiovascular: Denies palpitation, chest discomfort or lower extremity  swelling Gastrointestinal:  Denies nausea, heartburn or change in bowel habits Skin: Denies abnormal skin rashes Lymphatics: Denies new lymphadenopathy or easy bruising Neurological:Denies numbness, tingling or new weaknesses Behavioral/Psych: Mood is stable, no new changes  All other systems were reviewed with the patient and are negative.  I have reviewed the past medical history, past surgical history, social history and family history with the patient and they are unchanged from previous note.  ALLERGIES:  is allergic to norvasc and sulfa antibiotics.  MEDICATIONS:  Current Outpatient Prescriptions  Medication Sig Dispense Refill  . aspirin 81 MG chewable tablet Chew 1 tablet (81 mg total) by mouth daily.    . clopidogrel (PLAVIX) 75 MG tablet Take 1 tablet (75 mg total) by mouth daily. 30 tablet 11  . dasatinib (SPRYCEL) 140 MG tablet Take 1 tablet (140 mg total) by mouth daily. 30 tablet 6  . furosemide (LASIX) 20 MG tablet Take 1 tablet (20 mg total) by mouth daily. 30 tablet 3  . guaiFENesin (MUCINEX) 600 MG 12 hr tablet Take 1200 mg by mouth 2-3 times a day as needed for cough or to loosen phlegm    . mirtazapine (REMERON) 30 MG tablet TAKE ONE TABLET BY MOUTH AT BEDTIME 30 tablet 11  . nitroGLYCERIN (NITROSTAT) 0.4 MG SL tablet Place 1 tablet (0.4 mg total) under the tongue every 5 (five) minutes as needed for chest pain. 25 tablet 4  . potassium chloride SA (K-DUR,KLOR-CON) 20 MEQ tablet Take 20 mEq by mouth daily.    Marland Kitchen atorvastatin (LIPITOR) 80 MG tablet TAKE 1 TABLET EVERY DAY  AT  6:00  PM 90 tablet 1  . carvedilol (COREG)  6.25 MG tablet Take 1 tablet (6.25 mg total) by mouth 2 (two) times daily. 180 tablet 1  . lisinopril (PRINIVIL,ZESTRIL) 2.5 MG tablet Take 1 tablet (2.5 mg total) by mouth daily. 90 tablet 1   No current facility-administered medications for this visit.    PHYSICAL EXAMINATION: ECOG PERFORMANCE STATUS: 1 - Symptomatic but completely ambulatory  Filed  Vitals:   02/05/15 0924  BP: 152/65  Pulse: 78  Temp: 98.1 F (36.7 C)  Resp: 18   Filed Weights   02/05/15 0924  Weight: 180 lb 6.4 oz (81.829 kg)    GENERAL:alert, no distress and comfortable SKIN: skin color, texture, turgor are normal, no rashes or significant lesions EYES: normal, Conjunctiva are pink and non-injected, sclera clear OROPHARYNX:no exudate, no erythema and lips, buccal mucosa, and tongue normal  NECK: supple, thyroid normal size, non-tender, without nodularity LYMPH:  no palpable lymphadenopathy in the cervical, axillary or inguinal LUNGS: clear to auscultation and percussion with normal breathing effort HEART: regular rate & rhythm and no murmurs and no lower extremity edema ABDOMEN:abdomen soft, non-tender and normal bowel sounds Musculoskeletal:no cyanosis of digits and no clubbing  NEURO: alert & oriented x 3 with fluent speech, no focal motor/sensory deficits  LABORATORY DATA:  I have reviewed the data as listed    Component Value Date/Time   NA 143 01/29/2015 0843   NA 136 12/06/2014 0442   K 4.0 01/29/2015 0843   K 3.4* 12/06/2014 0442   CL 102 12/06/2014 0442   CL 110* 11/14/2012 0918   CO2 24 01/29/2015 0843   CO2 24 12/06/2014 0442   GLUCOSE 115 01/29/2015 0843   GLUCOSE 92 12/06/2014 0442   GLUCOSE 108* 11/14/2012 0918   BUN 14.0 01/29/2015 0843   BUN 14 12/06/2014 0442   CREATININE 0.8 01/29/2015 0843   CREATININE 1.16 12/06/2014 0442   CREATININE 1.16 06/19/2014 1610   CALCIUM 8.6 01/29/2015 0843   CALCIUM 7.6* 12/06/2014 0442   PROT 6.0* 01/29/2015 0843   PROT 5.4* 11/29/2014 0400   ALBUMIN 3.1* 01/29/2015 0843   ALBUMIN 2.5* 11/29/2014 0400   AST 22 01/29/2015 0843   AST 22 11/29/2014 0400   ALT 23 01/29/2015 0843   ALT 16 11/29/2014 0400   ALKPHOS 91 01/29/2015 0843   ALKPHOS 65 11/29/2014 0400   BILITOT 0.42 01/29/2015 0843   BILITOT 0.8 11/29/2014 0400   GFRNONAA 62* 12/06/2014 0442   GFRNONAA 63 06/19/2014 1610   GFRAA  72* 12/06/2014 0442   GFRAA 73 06/19/2014 1610    No results found for: SPEP, UPEP  Lab Results  Component Value Date   WBC 6.2 01/29/2015   NEUTROABS 3.9 01/29/2015   HGB 10.5* 01/29/2015   HCT 32.5* 01/29/2015   MCV 100.3* 01/29/2015   PLT 159 01/29/2015      Chemistry      Component Value Date/Time   NA 143 01/29/2015 0843   NA 136 12/06/2014 0442   K 4.0 01/29/2015 0843   K 3.4* 12/06/2014 0442   CL 102 12/06/2014 0442   CL 110* 11/14/2012 0918   CO2 24 01/29/2015 0843   CO2 24 12/06/2014 0442   BUN 14.0 01/29/2015 0843   BUN 14 12/06/2014 0442   CREATININE 0.8 01/29/2015 0843   CREATININE 1.16 12/06/2014 0442   CREATININE 1.16 06/19/2014 1610      Component Value Date/Time   CALCIUM 8.6 01/29/2015 0843   CALCIUM 7.6* 12/06/2014 0442   ALKPHOS 91 01/29/2015 0843   ALKPHOS 65  11/29/2014 0400   AST 22 01/29/2015 0843   AST 22 11/29/2014 0400   ALT 23 01/29/2015 0843   ALT 16 11/29/2014 0400   BILITOT 0.42 01/29/2015 0843   BILITOT 0.8 11/29/2014 0400      ASSESSMENT & PLAN:  Chronic myelogenous leukemia Unfortunately, repeat BCR/ABL showed 3 fold increase in detectable transcripts. I am concerned about disease progression I recommend increasing dasatinib to 140 mg and recheck in 2 months. If not improving, we will send Kinase mutation study   Anemia in neoplastic disease This is likely due to recent treatment. The patient denies recent history of bleeding such as epistaxis, hematuria or hematochezia. He is asymptomatic from the anemia. I will observe for now.  He does not require transfusion now. I will continue the chemotherapy at current dose without dosage adjustment.  If the anemia gets progressive worse in the future, I might have to delay his treatment or adjust the chemotherapy dose.     Pleural effusion, right This is treated adequately. Clinical exam showed good breath sounds on the right Monitor closely   Orders Placed This Encounter   Procedures  . Comprehensive metabolic panel    Standing Status: Future     Number of Occurrences:      Standing Expiration Date: 03/11/2016  . CBC with Differential/Platelet    Standing Status: Future     Number of Occurrences:      Standing Expiration Date: 03/11/2016  . Lactate dehydrogenase    Standing Status: Future     Number of Occurrences:      Standing Expiration Date: 03/11/2016  . BCR-ABL    With RT-PCR technique    Standing Status: Future     Number of Occurrences:      Standing Expiration Date: 03/11/2016   All questions were answered. The patient knows to call the clinic with any problems, questions or concerns. No barriers to learning was detected. I spent 25 minutes counseling the patient face to face. The total time spent in the appointment was 30 minutes and more than 50% was on counseling and review of test results     [facility], [place], MD 02/05/2015 10:36 PM

## 2015-02-05 NOTE — Assessment & Plan Note (Signed)
This is treated adequately. Clinical exam showed good breath sounds on the right Monitor closely

## 2015-02-05 NOTE — Telephone Encounter (Signed)
Medication refilled per protocol. 

## 2015-02-05 NOTE — Assessment & Plan Note (Addendum)
Unfortunately, repeat BCR/ABL showed 3 fold increase in detectable transcripts. I am concerned about disease progression I recommend increasing dasatinib to 140 mg and recheck in 2 months. If not improving, we will send Kinase mutation study

## 2015-02-06 ENCOUNTER — Other Ambulatory Visit: Payer: Self-pay | Admitting: *Deleted

## 2015-02-06 DIAGNOSIS — C9210 Chronic myeloid leukemia, BCR/ABL-positive, not having achieved remission: Secondary | ICD-10-CM

## 2015-02-06 MED ORDER — DASATINIB 140 MG PO TABS: 140.0000 mg | ORAL_TABLET | Freq: Every day | ORAL | Status: DC

## 2015-02-26 ENCOUNTER — Ambulatory Visit (INDEPENDENT_AMBULATORY_CARE_PROVIDER_SITE_OTHER): Payer: Commercial Managed Care - HMO | Admitting: Family Medicine

## 2015-02-26 ENCOUNTER — Encounter: Payer: Self-pay | Admitting: Family Medicine

## 2015-02-26 VITALS — BP 140/80 | HR 80 | Temp 98.3°F | Resp 24 | Ht 69.0 in | Wt 183.0 lb

## 2015-02-26 DIAGNOSIS — R0600 Dyspnea, unspecified: Secondary | ICD-10-CM

## 2015-02-26 DIAGNOSIS — R05 Cough: Secondary | ICD-10-CM

## 2015-02-26 DIAGNOSIS — R053 Chronic cough: Secondary | ICD-10-CM

## 2015-02-26 LAB — PULMONARY FUNCTION TEST

## 2015-02-26 MED ORDER — ALBUTEROL SULFATE HFA 108 (90 BASE) MCG/ACT IN AERS: 2.0000 | INHALATION_SPRAY | Freq: Four times a day (QID) | RESPIRATORY_TRACT | Status: DC | PRN

## 2015-02-26 MED ORDER — DIPHENOXYLATE-ATROPINE 2.5-0.025 MG PO TABS: 2.0000 | ORAL_TABLET | Freq: Four times a day (QID) | ORAL | Status: DC | PRN

## 2015-02-26 MED ORDER — LOSARTAN POTASSIUM 25 MG PO TABS: 25.0000 mg | ORAL_TABLET | Freq: Every day | ORAL | Status: DC

## 2015-02-26 NOTE — Progress Notes (Signed)
Subjective:    Patient ID: Anthony Wolfe, male    DOB: 06-16-1946, 69 y.o.   MRN: [id number]  HPI Patient underwent VATS procedure in April for recurrent right pleural effusion. Patient states that ever since that time, he has a persistent daily cough productive of white mucus, waxing and waning shortness of breath, and chest heaviness. He has a significant past medical history including COPD. Patient quit smoking in 1999 there was smoke 3 packs of cigarettes a day for decades prior to that. Also the patient has recently had NSTEMI in October 2015. He is been on an ACE inhibitor and a beta blocker ever since. He denies any hemoptysis. He denies any weight loss. 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  He has been tested on 2 separate occasions for C. difficile colitis both tested been negative.  He has been taking Mucinex 1200 mg a day but this is no longer beneficial. Sunday patient states he felt like he could not catch his breath. He denies any chest pain. He denies any angina. Past Medical History  Diagnosis Date  . Hyperlipidemia   . HTN (hypertension)   . Allergic rhinitis   . Prostate hypertrophy   . Anxiety   . CAD (coronary artery disease)     a. s/p 3v CABG 2010. b. NSTEMI 05/2014 in setting of VT. Cath impressions: "Recent IMI with occluded SVG to RCA. Has Right to Right collaterals and collaterals from septal and OM. EF 40%."  . TIA (transient ischemic attack)     ASPVD, S./P. right CEA, 2002, and known occluded left carotid  . 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 Ischemic cardiomyopathy     a. Prior EF 36%. b. 2014: 50%. c. 05/2014: EF 35-40% by echo, 40% with inf HK by cath.  . Carotid disease, bilateral     a. Right CEA 2002; known occluded left carotid. b. Duplex 06/2014: known occluded LICA, stable 2-77% RICA s/p CEA.  . Depression   . PVC's (premature ventricular contractions)     a. PVC's with  prior Holter showing PVC load of 22%.  . 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 Pneumonia 05/23/2014  . Ventricular tachycardia     a. Admitted with VT 05/2013 - EPS with inducible sustained monomorphic VT; s/p [employer] ICD 05/21/14.  . Emphysema of lung   . NSTEMI (non-ST elevation myocardial infarction) 05/2014  . Pleural effusion 05/2014  . Chronic systolic CHF (congestive heart failure)   . C. difficile colitis 06/16/2014  . AICD (automatic cardioverter/defibrillator) present   . Presence of permanent cardiac pacemaker   . Stroke 01  . Shortness of breath dyspnea   . Blood dyscrasia     cmleukemia   Past Surgical History  Procedure Laterality Date  . Coronary artery bypass graft  02/2009    3 vessel  . Colonoscopy  07/2010  . Carotidectomy  2003    right side  . Back surgery    . Electrophysiologic study  05/21/14  . Ep implantable device  05/21/14    single chamber Metronic ICD  . Left heart catheterization with coronary/graft angiogram N/A 05/19/2014    Procedure: LEFT HEART CATHETERIZATION WITH Beatrix Fetters;  Surgeon: Josue Hector, MD;  Location: [facility] CATH LAB;  Service: Cardiovascular;  Laterality: N/A;  . Electrophysiology study N/A 05/21/2014    Procedure: ELECTROPHYSIOLOGY STUDY;  Surgeon: Deboraha Sprang, MD;  Location: [facility] CATH LAB;  Service: Cardiovascular;  Laterality: N/A;  . Carotid endarterectomy Right 01  . Video assisted thoracoscopy Right 11/27/2014    Procedure: RIGHT VIDEO ASSISTED THORACOSCOPY;  Surgeon: [name], MD;  Location: [place];  Service: Thoracic;  Laterality: Right;  . Pleural effusion drainage Right 11/27/2014    Procedure: DRAINAGE OF PLEURAL EFFUSION;  Surgeon: [name], MD;  Location: [place];  Service: Thoracic;  Laterality: Right;  . Pleural biopsy N/A 11/27/2014    Procedure: PLEURAL BIOPSY;  Surgeon: [name], MD;  Location: [place];  Service: Thoracic;  Laterality: N/A;  . Talc pleurodesis Right  11/27/2014    Procedure: Pietro Cassis;  Surgeon: [name], MD;  Location: [place];  Service: Thoracic;  Laterality: Right;   Current Outpatient Prescriptions on File Prior to Visit  Medication Sig Dispense Refill  . aspirin 81 MG chewable tablet Chew 1 tablet (81 mg total) by mouth daily.    Marland Kitchen atorvastatin (LIPITOR) 80 MG tablet TAKE 1 TABLET EVERY DAY  AT  6:00  PM 90 tablet 1  . carvedilol (COREG) 6.25 MG tablet Take 1 tablet (6.25 mg total) by mouth 2 (two) times daily. 180 tablet 1  . clopidogrel (PLAVIX) 75 MG tablet Take 1 tablet (75 mg total) by mouth daily. 30 tablet 11  . dasatinib (SPRYCEL) 140 MG tablet Take 1 tablet (140 mg total) by mouth daily. 30 tablet 6  . furosemide (LASIX) 20 MG tablet Take 1 tablet (20 mg total) by mouth daily. 30 tablet 3  . guaiFENesin (MUCINEX) 600 MG 12 hr tablet Take 1200 mg by mouth 2-3 times a day as needed for cough or to loosen phlegm    . lisinopril (PRINIVIL,ZESTRIL) 2.5 MG tablet Take 1 tablet (2.5 mg total) by mouth daily. 90 tablet 1  . mirtazapine (REMERON) 30 MG tablet TAKE ONE TABLET BY MOUTH AT BEDTIME 30 tablet 11  . nitroGLYCERIN (NITROSTAT) 0.4 MG SL tablet Place 1 tablet (0.4 mg total) under the tongue every 5 (five) minutes as needed for chest pain. 25 tablet 4  . potassium chloride SA (K-DUR,KLOR-CON) 20 MEQ tablet Take 20 mEq by mouth daily.     No current facility-administered medications on file prior to visit.   Allergies  Allergen Reactions  . Norvasc [Amlodipine Besylate] Rash    rash  . Sulfa Antibiotics Hives    Hives    History   Social History  . Marital Status: Married    Spouse Name: N/A  . Number of Children: 2  . Years of Education: N/A   Occupational History  .      retired Development worker, [employer]   Social History Main Topics  . Smoking status: Former Smoker -- 3.00 packs/day for 45 years    Types: Cigarettes    Quit date: 08/08/1997  . Smokeless tobacco: Former Systems developer  . Alcohol Use: No  . Drug  Use: No  . Sexual Activity: Not on file   Other Topics Concern  . Not on file   Social History Narrative      Review of Systems  All other systems reviewed and are negative.      Objective:   Physical Exam  Constitutional: He appears well-developed and well-nourished. No distress.  HENT:  Right Ear: External ear normal.  Left Ear: External ear normal.  Nose: Nose normal.  Mouth/Throat: Oropharynx is clear and moist. No oropharyngeal exudate.  Eyes: Conjunctivae are normal.  Neck: Neck supple.  Cardiovascular: Normal rate, regular rhythm and  normal heart sounds.   No murmur heard. Pulmonary/Chest: Effort normal and breath sounds normal. No respiratory distress. He has no wheezes. He has no rales. He exhibits no tenderness.  Abdominal: Soft. Bowel sounds are normal. He exhibits no distension. There is no tenderness. There is no rebound and no guarding.  Lymphadenopathy:    He has no cervical adenopathy.  Skin: He is not diaphoretic.  Vitals reviewed.         Assessment & Plan:  Chronic cough  Dyspnea  I am concerned that the patient's symptoms sound like emphysema.  I will screen the patient with pulmonary function test. Also believe that the ACE inhibitor he is taking may be contributing to his cough. Therefore I will discontinue the ACE inhibitor and replace it with losartan 25 mg a day.  Permanent function tests reveal an FEV1 percentage of 74% with an FEV1 of 1.49 L which is 48% of predicted consistent with stage III COPD.  Begin stiolto 2 inhalations daily. Recheck in 2 weeks. Also use albuterol 2 puffs inhaled every 6 hours as needed. Recheck sooner if worse. I did give the patient some Lomotil for diarrhea

## 2015-03-05 ENCOUNTER — Other Ambulatory Visit: Payer: Self-pay | Admitting: Hematology and Oncology

## 2015-03-06 ENCOUNTER — Other Ambulatory Visit (HOSPITAL_BASED_OUTPATIENT_CLINIC_OR_DEPARTMENT_OTHER): Payer: Commercial Managed Care - HMO

## 2015-03-06 DIAGNOSIS — D630 Anemia in neoplastic disease: Secondary | ICD-10-CM | POA: Diagnosis not present

## 2015-03-06 DIAGNOSIS — C9290 Myeloid leukemia, unspecified, not having achieved remission: Secondary | ICD-10-CM

## 2015-03-06 DIAGNOSIS — C9210 Chronic myeloid leukemia, BCR/ABL-positive, not having achieved remission: Secondary | ICD-10-CM

## 2015-03-06 LAB — CBC WITH DIFFERENTIAL/PLATELET
BASO%: 0.6 % (ref 0.0–2.0)
Basophils Absolute: 0 10*3/uL (ref 0.0–0.1)
EOS%: 2.5 % (ref 0.0–7.0)
Eosinophils Absolute: 0.2 10*3/uL (ref 0.0–0.5)
HCT: 34.8 % — ABNORMAL LOW (ref 38.4–49.9)
HGB: 11.4 g/dL — ABNORMAL LOW (ref 13.0–17.1)
LYMPH#: 1.8 10*3/uL (ref 0.9–3.3)
LYMPH%: 26.4 % (ref 14.0–49.0)
MCH: 32.6 pg (ref 27.2–33.4)
MCHC: 32.9 g/dL (ref 32.0–36.0)
MCV: 99 fL — ABNORMAL HIGH (ref 79.3–98.0)
MONO#: 0.7 10*3/uL (ref 0.1–0.9)
MONO%: 10.2 % (ref 0.0–14.0)
NEUT%: 60.3 % (ref 39.0–75.0)
NEUTROS ABS: 4.2 10*3/uL (ref 1.5–6.5)
Platelets: 133 10*3/uL — ABNORMAL LOW (ref 140–400)
RBC: 3.51 10*6/uL — AB (ref 4.20–5.82)
RDW: 15.3 % — AB (ref 11.0–14.6)
WBC: 7 10*3/uL (ref 4.0–10.3)

## 2015-03-06 LAB — COMPREHENSIVE METABOLIC PANEL (CC13)
ALT: 17 U/L (ref 0–55)
ANION GAP: 6 meq/L (ref 3–11)
AST: 18 U/L (ref 5–34)
Albumin: 3.3 g/dL — ABNORMAL LOW (ref 3.5–5.0)
Alkaline Phosphatase: 84 U/L (ref 40–150)
BUN: 11.3 mg/dL (ref 7.0–26.0)
CO2: 23 mEq/L (ref 22–29)
CREATININE: 1 mg/dL (ref 0.7–1.3)
Calcium: 8.4 mg/dL (ref 8.4–10.4)
Chloride: 114 mEq/L — ABNORMAL HIGH (ref 98–109)
EGFR: 75 mL/min/{1.73_m2} — AB (ref >90)
Glucose: 118 mg/dl (ref 70–140)
Potassium: 3.7 mEq/L (ref 3.5–5.1)
SODIUM: 143 meq/L (ref 136–145)
Total Bilirubin: 0.48 mg/dL (ref 0.20–1.20)
Total Protein: 6.1 g/dL — ABNORMAL LOW (ref 6.4–8.3)

## 2015-03-06 LAB — LACTATE DEHYDROGENASE (CC13): LDH: 110 U/L — ABNORMAL LOW (ref 125–245)

## 2015-03-17 ENCOUNTER — Encounter: Payer: Self-pay | Admitting: Family Medicine

## 2015-03-17 ENCOUNTER — Ambulatory Visit (INDEPENDENT_AMBULATORY_CARE_PROVIDER_SITE_OTHER): Payer: Commercial Managed Care - HMO | Admitting: Family Medicine

## 2015-03-17 ENCOUNTER — Ambulatory Visit (HOSPITAL_BASED_OUTPATIENT_CLINIC_OR_DEPARTMENT_OTHER): Payer: Commercial Managed Care - HMO | Admitting: Hematology and Oncology

## 2015-03-17 ENCOUNTER — Ambulatory Visit
Admission: RE | Admit: 2015-03-17 | Discharge: 2015-03-17 | Disposition: A | Discharge status: Home / Self Care | Payer: Commercial Managed Care - HMO | Source: Ambulatory Visit | Attending: Family Medicine | Admitting: Family Medicine

## 2015-03-17 ENCOUNTER — Telehealth: Payer: Self-pay | Admitting: Hematology and Oncology

## 2015-03-17 ENCOUNTER — Other Ambulatory Visit: Payer: Self-pay | Admitting: Hematology and Oncology

## 2015-03-17 ENCOUNTER — Telehealth: Payer: Self-pay | Admitting: *Deleted

## 2015-03-17 ENCOUNTER — Encounter: Payer: Self-pay | Admitting: Hematology and Oncology

## 2015-03-17 VITALS — BP 150/80 | HR 74 | Temp 98.2°F | Resp 20 | Ht 69.0 in | Wt 184.0 lb

## 2015-03-17 VITALS — BP 175/73 | HR 76 | Temp 98.2°F | Resp 20 | Wt 185.0 lb

## 2015-03-17 DIAGNOSIS — J948 Other specified pleural conditions: Secondary | ICD-10-CM

## 2015-03-17 DIAGNOSIS — R0602 Shortness of breath: Secondary | ICD-10-CM

## 2015-03-17 DIAGNOSIS — T451X5A Adverse effect of antineoplastic and immunosuppressive drugs, initial encounter: Secondary | ICD-10-CM

## 2015-03-17 DIAGNOSIS — D6959 Other secondary thrombocytopenia: Secondary | ICD-10-CM | POA: Diagnosis not present

## 2015-03-17 DIAGNOSIS — K521 Toxic gastroenteritis and colitis: Secondary | ICD-10-CM | POA: Diagnosis not present

## 2015-03-17 DIAGNOSIS — C9210 Chronic myeloid leukemia, BCR/ABL-positive, not having achieved remission: Secondary | ICD-10-CM

## 2015-03-17 DIAGNOSIS — D6481 Anemia due to antineoplastic chemotherapy: Secondary | ICD-10-CM

## 2015-03-17 DIAGNOSIS — C9510 Chronic leukemia of unspecified cell type not having achieved remission: Secondary | ICD-10-CM | POA: Diagnosis not present

## 2015-03-17 DIAGNOSIS — T50905A Adverse effect of unspecified drugs, medicaments and biological substances, initial encounter: Secondary | ICD-10-CM

## 2015-03-17 DIAGNOSIS — R05 Cough: Secondary | ICD-10-CM | POA: Diagnosis not present

## 2015-03-17 DIAGNOSIS — J90 Pleural effusion, not elsewhere classified: Secondary | ICD-10-CM

## 2015-03-17 NOTE — Assessment & Plan Note (Signed)
This is likely due to recent treatment. The patient denies recent history of bleeding such as epistaxis, hematuria or hematochezia. He is asymptomatic from the anemia. I will observe for now.  He does not require transfusion now. I will continue the chemotherapy at current dose without dosage adjustment.  If the anemia gets progressive worse in the future, I might have to delay his treatment or adjust the chemotherapy dose.  

## 2015-03-17 NOTE — Assessment & Plan Note (Signed)
This is likely due to recent treatment. The patient denies recent history of bleeding such as epistaxis, hematuria or hematochezia. He is asymptomatic from the low platelet count. I will observe for now.  he does not require transfusion now. I will continue the chemotherapy at current dose without dosage adjustment.  If the thrombocytopenia gets progressive worse in the future, I might have to delay his treatment or adjust the chemotherapy dose.   

## 2015-03-17 NOTE — Assessment & Plan Note (Signed)
This is treated adequately. Clinical exam showed good breath sounds on the right Will monitor closely

## 2015-03-17 NOTE — Progress Notes (Signed)
[facility] OFFICE PROGRESS NOTE  Patient Care Team: [name], MD as PCP - General (Family Medicine) [name], MD as Attending Physician (Cardiology) [name], MD (Family Medicine) [name], MD (Cardiothoracic Surgery) [name], MD as Attending Physician (Vascular Surgery) [name], MD as Consulting Physician (Hematology and Oncology) Deboraha Sprang, MD as Consulting Physician (Cardiology)  SUMMARY OF ONCOLOGIC HISTORY:   Chronic myelogenous leukemia   05/04/2012 Bone Marrow Biopsy BM confirmed diagnosis of CML in Chronic phase. BCR/ABL by PCR detected abnormalitis with b2a2 & b3a2 subtypes   05/09/2012 - 06/06/2013 Chemotherapy He was started on treatment with Dasatinib 100 mg daily   06/06/2012 Adverse Reaction Dose of medication was reduced to 50 mg daily due to pancytopenia   01/24/2013 Progression Patient was noted to have elevated blood count which and was subsequently found to be noncompliant to treatment. The patient has not been on treatment for several months because his prescription ran out. He was restarted back on treatment   01/16/2014 Progression Bcr/ABL by PCR is worse. Dose of Dasatinib was increased to 100 mg.   02/26/2014 Tumor Marker Blood work for ABL kinase mutation was negative.   10/29/2014 Tumor Marker BCR/ABL b2a2 & b3a2 0.29%, IS 0.1624%, not in MMR yet but improving   01/30/2015 Tumor Marker BCR/ABL b2a2 & b3a2 0.78%, IS 0.4368%, not in MMR    03/11/2015 Pathology Results BCR/ABL b2a2 0.19%, IS 0.1064%, not in MMR     INTERVAL HISTORY: Please see below for problem oriented charting. He returns for further follow-up. He complained of shortness of breath on moderate exertion and diarrhea. The shortness of breath is stable. In terms of diarrhea, it is currently controlled with Lomotil. He denies recent infection. He is very compliant taking his medications.  REVIEW OF SYSTEMS:   Constitutional: Denies fevers,  chills or abnormal weight loss Eyes: Denies blurriness of vision Ears, nose, mouth, throat, and face: Denies mucositis or sore throat Cardiovascular: Denies palpitation, chest discomfort or lower extremity swelling Skin: Denies abnormal skin rashes Lymphatics: Denies new lymphadenopathy or easy bruising Neurological:Denies numbness, tingling or new weaknesses Behavioral/Psych: Mood is stable, no new changes  All other systems were reviewed with the patient and are negative.  I have reviewed the past medical history, past surgical history, social history and family history with the patient and they are unchanged from previous note.  ALLERGIES:  is allergic to norvasc and sulfa antibiotics.  MEDICATIONS:  Current Outpatient Prescriptions  Medication Sig Dispense Refill  . albuterol (PROVENTIL HFA;VENTOLIN HFA) 108 (90 BASE) MCG/ACT inhaler Inhale 2 puffs into the lungs every 6 (six) hours as needed for wheezing or shortness of breath. 1 Inhaler 0  . aspirin 81 MG chewable tablet Chew 1 tablet (81 mg total) by mouth daily.    Marland Kitchen atorvastatin (LIPITOR) 80 MG tablet TAKE 1 TABLET EVERY DAY  AT  6:00  PM 90 tablet 1  . carvedilol (COREG) 6.25 MG tablet Take 1 tablet (6.25 mg total) by mouth 2 (two) times daily. 180 tablet 1  . clopidogrel (PLAVIX) 75 MG tablet Take 1 tablet (75 mg total) by mouth daily. 30 tablet 11  . dasatinib (SPRYCEL) 140 MG tablet Take 1 tablet (140 mg total) by mouth daily. 30 tablet 6  . diphenoxylate-atropine (LOMOTIL) 2.5-0.025 MG per tablet Take 2 tablets by mouth 4 (four) times daily as needed for diarrhea or loose stools. 30 tablet 0  . furosemide (LASIX) 20 MG tablet Take  1 tablet (20 mg total) by mouth daily. 30 tablet 3  . guaiFENesin (MUCINEX) 600 MG 12 hr tablet Take 1200 mg by mouth 2-3 times a day as needed for cough or to loosen phlegm    . losartan (COZAAR) 25 MG tablet Take 1 tablet (25 mg total) by mouth daily. Stop lisinopril. 30 tablet 5  . mirtazapine  (REMERON) 30 MG tablet TAKE ONE TABLET BY MOUTH AT BEDTIME 30 tablet 11  . nitroGLYCERIN (NITROSTAT) 0.4 MG SL tablet Place 1 tablet (0.4 mg total) under the tongue every 5 (five) minutes as needed for chest pain. 25 tablet 4  . potassium chloride SA (K-DUR,KLOR-CON) 20 MEQ tablet Take 20 mEq by mouth daily.    . Tiotropium Bromide-Olodaterol (STIOLTO RESPIMAT) 2.5-2.5 MCG/ACT AERS Inhale 2 puffs into the lungs daily.     No current facility-administered medications for this visit.    PHYSICAL EXAMINATION: ECOG PERFORMANCE STATUS: 1 - Symptomatic but completely ambulatory  Filed Vitals:   03/17/15 0834  BP: 175/73  Pulse: 76  Temp: 98.2 F (36.8 C)  Resp: 20   Filed Weights   03/17/15 0834  Weight: 185 lb (83.915 kg)    GENERAL:alert, no distress and comfortable SKIN: skin color, texture, turgor are normal, no rashes or significant lesions EYES: normal, Conjunctiva are pink and non-injected, sclera clear OROPHARYNX:no exudate, no erythema and lips, buccal mucosa, and tongue normal  NECK: supple, thyroid normal size, non-tender, without nodularity LYMPH:  no palpable lymphadenopathy in the cervical, axillary or inguinal LUNGS: clear to auscultation and percussion with normal breathing effort HEART: regular rate & rhythm and no murmurs and no lower extremity edema ABDOMEN:abdomen soft, non-tender and normal bowel sounds Musculoskeletal:no cyanosis of digits and no clubbing  NEURO: alert & oriented x 3 with fluent speech, no focal motor/sensory deficits  LABORATORY DATA:  I have reviewed the data as listed    Component Value Date/Time   NA 143 03/06/2015 0905   NA 136 12/06/2014 0442   K 3.7 03/06/2015 0905   K 3.4* 12/06/2014 0442   CL 102 12/06/2014 0442   CL 110* 11/14/2012 0918   CO2 23 03/06/2015 0905   CO2 24 12/06/2014 0442   GLUCOSE 118 03/06/2015 0905   GLUCOSE 92 12/06/2014 0442   GLUCOSE 108* 11/14/2012 0918   BUN 11.3 03/06/2015 0905   BUN 14 12/06/2014  0442   CREATININE 1.0 03/06/2015 0905   CREATININE 1.16 12/06/2014 0442   CREATININE 1.16 06/19/2014 1610   CALCIUM 8.4 03/06/2015 0905   CALCIUM 7.6* 12/06/2014 0442   PROT 6.1* 03/06/2015 0905   PROT 5.4* 11/29/2014 0400   ALBUMIN 3.3* 03/06/2015 0905   ALBUMIN 2.5* 11/29/2014 0400   AST 18 03/06/2015 0905   AST 22 11/29/2014 0400   ALT 17 03/06/2015 0905   ALT 16 11/29/2014 0400   ALKPHOS 84 03/06/2015 0905   ALKPHOS 65 11/29/2014 0400   BILITOT 0.48 03/06/2015 0905   BILITOT 0.8 11/29/2014 0400   GFRNONAA 62* 12/06/2014 0442   GFRNONAA 63 06/19/2014 1610   GFRAA 72* 12/06/2014 0442   GFRAA 73 06/19/2014 1610    No results found for: SPEP, UPEP  Lab Results  Component Value Date   WBC 7.0 03/06/2015   NEUTROABS 4.2 03/06/2015   HGB 11.4* 03/06/2015   HCT 34.8* 03/06/2015   MCV 99.0* 03/06/2015   PLT 133* 03/06/2015      Chemistry      Component Value Date/Time   NA 143 03/06/2015 0905  NA 136 12/06/2014 0442   K 3.7 03/06/2015 0905   K 3.4* 12/06/2014 0442   CL 102 12/06/2014 0442   CL 110* 11/14/2012 0918   CO2 23 03/06/2015 0905   CO2 24 12/06/2014 0442   BUN 11.3 03/06/2015 0905   BUN 14 12/06/2014 0442   CREATININE 1.0 03/06/2015 0905   CREATININE 1.16 12/06/2014 0442   CREATININE 1.16 06/19/2014 1610      Component Value Date/Time   CALCIUM 8.4 03/06/2015 0905   CALCIUM 7.6* 12/06/2014 0442   ALKPHOS 84 03/06/2015 0905   ALKPHOS 65 11/29/2014 0400   AST 18 03/06/2015 0905   AST 22 11/29/2014 0400   ALT 17 03/06/2015 0905   ALT 16 11/29/2014 0400   BILITOT 0.48 03/06/2015 0905   BILITOT 0.8 11/29/2014 0400     ASSESSMENT & PLAN:  Chronic myelogenous leukemia Unfortunately, BCR/ABL continue to be detectable and the patient has not achieved a major molecular response. Since I increased dasatinib to 140 mg he is experienced sitting more side effects with diarrhea and shortness of breath  He will return here on a monthly basis for repeat blood  work. I gave the patient education handout regarding Bosutinib In the meantime, we will continue current medication but in the future, if he continues to have problem with side effects we might have to switch his treatment.   Anemia due to antineoplastic chemotherapy This is likely due to recent treatment. The patient denies recent history of bleeding such as epistaxis, hematuria or hematochezia. He is asymptomatic from the anemia. I will observe for now.  He does not require transfusion now. I will continue the chemotherapy at current dose without dosage adjustment.  If the anemia gets progressive worse in the future, I might have to delay his treatment or adjust the chemotherapy dose.   Thrombocytopenia due to drugs This is likely due to recent treatment. The patient denies recent history of bleeding such as epistaxis, hematuria or hematochezia. He is asymptomatic from the low platelet count. I will observe for now.  he does not require transfusion now. I will continue the chemotherapy at current dose without dosage adjustment.  If the thrombocytopenia gets progressive worse in the future, I might have to delay his treatment or adjust the chemotherapy dose.    Diarrhea due to drug Diary is a well-known side effect of Sprycel. It is currently controlled with Lomotil If he continues to get worse in the future, I might decide to switch him to Bosutinib in the future  Pleural effusion, right This is treated adequately. Clinical exam showed good breath sounds on the right Will monitor closely     Orders Placed This Encounter  Procedures  . CBC with Differential/Platelet    Standing Status: Future     Number of Occurrences:      Standing Expiration Date: 04/20/2016  . Comprehensive metabolic panel    Standing Status: Future     Number of Occurrences:      Standing Expiration Date: 04/20/2016  . BCR-ABL    With RT-PCR technique    Standing Status: Future     Number of Occurrences:       Standing Expiration Date: 04/20/2016   All questions were answered. The patient knows to call the clinic with any problems, questions or concerns. No barriers to learning was detected. I spent 25 minutes counseling the patient face to face. The total time spent in the appointment was 30 minutes and more than 50% was on  counseling and review of test results     [facility], Ryleah Miramontes, MD 03/17/2015 10:00 AM

## 2015-03-17 NOTE — Assessment & Plan Note (Addendum)
Diary is a well-known side effect of Sprycel. It is currently controlled with Lomotil If he continues to get worse in the future, I might decide to switch him to Bosutinib in the future

## 2015-03-17 NOTE — Progress Notes (Signed)
Subjective:    Patient ID: Anthony Wolfe, male    DOB: 04-20-1946, 69 y.o.   MRN: [id number]  HPI 02/26/15 Patient underwent VATS procedure in April for recurrent right pleural effusion. Patient states that ever since that time, he has a persistent daily cough productive of white mucus, waxing and waning shortness of breath, and chest heaviness. He has a significant past medical history including COPD. Patient quit smoking in 1999 there was smoke 3 packs of cigarettes a day for decades prior to that. Also the patient has recently had NSTEMI in October 2015. He is been on an ACE inhibitor and a beta blocker ever since. He denies any hemoptysis. He denies any weight loss. 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  He has been tested on 2 separate occasions for C. difficile colitis both tested been negative.  He has been taking Mucinex 1200 mg a day but this is no longer beneficial. Sunday patient states he felt like he could not catch his breath. He denies any chest pain. He denies any angina.  At that time, my plan was: I am concerned that the patient's symptoms sound like emphysema.  I will screen the patient with pulmonary function test. Also believe that the ACE inhibitor he is taking may be contributing to his cough. Therefore I will discontinue the ACE inhibitor and replace it with losartan 25 mg a day.  Permanent function tests reveal an FEV1 percentage of 74% with an FEV1 of 1.49 L which is 48% of predicted consistent with stage III COPD.  Begin stiolto 2 inhalations daily. Recheck in 2 weeks. Also use albuterol 2 puffs inhaled every 6 hours as needed. Recheck sooner if worse. I did give the patient some Lomotil for diarrhea  03/17/15  patient did very well for 2 weeks on stiolto  And off his ACE inhibitor. However over the last week he is started developing increasing dyspnea on exertion and continues to have a persistent daily cough  productive of white mucus.   He spoke to his oncologist this morning and they believe it could be related to his Sprycell.   After reviewing the literature on this medicine that does report up to 25%  The patient's report dyspnea on this medication and have pleural effusions. He certainly has both. However on examination today he has new left basilar crackles that were not there previously. Also he had been doing better up until last week which makes me think this is a new event exacerbating his underlying issues. Past Medical History  Diagnosis Date  . Hyperlipidemia   . HTN (hypertension)   . Allergic rhinitis   . Prostate hypertrophy   . Anxiety   . CAD (coronary artery disease)     a. s/p 3v CABG 2010. b. NSTEMI 05/2014 in setting of VT. Cath impressions: "Recent IMI with occluded SVG to RCA. Has Right to Right collaterals and collaterals from septal and OM. EF 40%."  . TIA (transient ischemic attack)     ASPVD, S./P. right CEA, 2002, and known occluded left carotid  . 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 Ischemic cardiomyopathy     a. Prior EF 36%. b. 2014: 50%. c. 05/2014: EF 35-40% by echo, 40% with inf HK by cath.  . Carotid disease, bilateral     a. Right CEA 2002; known occluded left carotid. b. Duplex 06/2014: known occluded LICA, stable 2-54% RICA s/p CEA.  Marland Kitchen  Depression   . PVC's (premature ventricular contractions)     a. PVC's with prior Holter showing PVC load of 22%.  . 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 Pneumonia 05/23/2014  . Ventricular tachycardia     a. Admitted with VT 05/2013 - EPS with inducible sustained monomorphic VT; s/p Med[employer]onic ICD 05/21/14.  . Emphysema of lung   . NSTEMI (non-ST elevation myocardial infarction) 05/2014  . Pleural effusion 05/2014  . Chronic systolic CHF (congestive heart failure)   . C. difficile colitis 06/16/2014  . AICD (automatic cardioverter/defibrillator) present   . Presence of permanent cardiac pacemaker   .  Stroke 01  . Shortness of breath dyspnea   . Blood dyscrasia     cmleukemia   Past Surgical History  Procedure Laterality Date  . Coronary artery bypass graft  02/2009    3 vessel  . Colonoscopy  07/2010  . Carotidectomy  2003    right side  . Back surgery    . Electrophysiologic study  05/21/14  . Ep implantable device  05/21/14    single chamber Metronic ICD  . Left heart catheterization with coronary/graft angiogram N/A 05/19/2014    Procedure: LEFT HEART CATHETERIZATION WITH Beatrix Fetters;  Surgeon: Josue Hector, MD;  Location: [facility] CATH LAB;  Service: Cardiovascular;  Laterality: N/A;  . Electrophysiology study N/A 05/21/2014    Procedure: ELECTROPHYSIOLOGY STUDY;  Surgeon: Deboraha Sprang, MD;  Location: [facility] CATH LAB;  Service: Cardiovascular;  Laterality: N/A;  . Carotid endarterectomy Right 01  . Video assisted thoracoscopy Right 11/27/2014    Procedure: RIGHT VIDEO ASSISTED THORACOSCOPY;  Surgeon: [name], MD;  Location: [place];  Service: Thoracic;  Laterality: Right;  . Pleural effusion drainage Right 11/27/2014    Procedure: DRAINAGE OF PLEURAL EFFUSION;  Surgeon: [name], MD;  Location: [place];  Service: Thoracic;  Laterality: Right;  . Pleural biopsy N/A 11/27/2014    Procedure: PLEURAL BIOPSY;  Surgeon: [name], MD;  Location: [place];  Service: Thoracic;  Laterality: N/A;  . Talc pleurodesis Right 11/27/2014    Procedure: Pietro Cassis;  Surgeon: [name], MD;  Location: [place];  Service: Thoracic;  Laterality: Right;   Current Outpatient Prescriptions on File Prior to Visit  Medication Sig Dispense Refill  . albuterol (PROVENTIL HFA;VENTOLIN HFA) 108 (90 BASE) MCG/ACT inhaler Inhale 2 puffs into the lungs every 6 (six) hours as needed for wheezing or shortness of breath. 1 Inhaler 0  . aspirin 81 MG chewable tablet Chew 1 tablet (81 mg total) by mouth daily.    Marland Kitchen atorvastatin (LIPITOR) 80 MG tablet TAKE 1 TABLET  EVERY DAY  AT  6:00  PM 90 tablet 1  . carvedilol (COREG) 6.25 MG tablet Take 1 tablet (6.25 mg total) by mouth 2 (two) times daily. 180 tablet 1  . clopidogrel (PLAVIX) 75 MG tablet Take 1 tablet (75 mg total) by mouth daily. 30 tablet 11  . dasatinib (SPRYCEL) 140 MG tablet Take 1 tablet (140 mg total) by mouth daily. 30 tablet 6  . diphenoxylate-atropine (LOMOTIL) 2.5-0.025 MG per tablet Take 2 tablets by mouth 4 (four) times daily as needed for diarrhea or loose stools. 30 tablet 0  . furosemide (LASIX) 20 MG tablet Take 1 tablet (20 mg total) by mouth daily. 30 tablet 3  . guaiFENesin (MUCINEX) 600 MG 12 hr tablet Take 1200 mg by mouth 2-3 times a day as needed for cough or to loosen phlegm    .  losartan (COZAAR) 25 MG tablet Take 1 tablet (25 mg total) by mouth daily. Stop lisinopril. 30 tablet 5  . mirtazapine (REMERON) 30 MG tablet TAKE ONE TABLET BY MOUTH AT BEDTIME 30 tablet 11  . nitroGLYCERIN (NITROSTAT) 0.4 MG SL tablet Place 1 tablet (0.4 mg total) under the tongue every 5 (five) minutes as needed for chest pain. 25 tablet 4  . potassium chloride SA (K-DUR,KLOR-CON) 20 MEQ tablet Take 20 mEq by mouth daily.    . Tiotropium Bromide-Olodaterol (STIOLTO RESPIMAT) 2.5-2.5 MCG/ACT AERS Inhale 2 puffs into the lungs daily.     No current facility-administered medications on file prior to visit.   Allergies  Allergen Reactions  . Norvasc [Amlodipine Besylate] Rash    rash  . Sulfa Antibiotics Hives    Hives    History   Social History  . Marital Status: Married    Spouse Name: N/A  . Number of Children: 2  . Years of Education: N/A   Occupational History  .      retired Development worker, [employer]   Social History Main Topics  . Smoking status: Former Smoker -- 3.00 packs/day for 45 years    Types: Cigarettes    Quit date: 08/08/1997  . Smokeless tobacco: Former Systems developer  . Alcohol Use: No  . Drug Use: No  . Sexual Activity: Not on file   Other Topics Concern  . Not on file   Social  History Narrative      Review of Systems  All other systems reviewed and are negative.      Objective:   Physical Exam  Constitutional: He appears well-developed and well-nourished. No distress.  HENT:  Right Ear: External ear normal.  Left Ear: External ear normal.  Nose: Nose normal.  Mouth/Throat: Oropharynx is clear and moist. No oropharyngeal exudate.  Eyes: Conjunctivae are normal.  Neck: Neck supple.  Cardiovascular: Normal rate, regular rhythm and normal heart sounds.   No murmur heard. Pulmonary/Chest: Effort normal. No respiratory distress. He has no wheezes. He has rales. He exhibits no tenderness.  Abdominal: Soft. Bowel sounds are normal. He exhibits no distension. There is no tenderness. There is no rebound and no guarding.  Lymphadenopathy:    He has no cervical adenopathy.  Skin: He is not diaphoretic.  Vitals reviewed.         Assessment & Plan:  SOB (shortness of breath) on exertion - Plan: DG Chest 2 View   while it is possible his medication is causing this I still believe he may have an element of emphysema and I am concerned he may be possibly developing left lower lobe pneumonia or possibly a new left sided pleural effusion. I will obtain a chest x-ray today to evaluate further. If there is evidence of pneumonia, I'll start the patient on antibiotics. If there is a pleural effusion I will start with diabetics. If there is a pleural effusion, he may need to consider stopping Sprycell  Because then the patient would've developed pleural effusions on numerous  Occasions for no reason other than that medication.

## 2015-03-17 NOTE — Assessment & Plan Note (Signed)
Unfortunately, BCR/ABL continue to be detectable and the patient has not achieved a major molecular response. Since I increased dasatinib to 140 mg he is experienced sitting more side effects with diarrhea and shortness of breath  He will return here on a monthly basis for repeat blood work. I gave the patient education handout regarding Bosutinib In the meantime, we will continue current medication but in the future, if he continues to have problem with side effects we might have to switch his treatment.

## 2015-03-17 NOTE — Telephone Encounter (Signed)
-----   Message from [name], MD sent at 03/17/2015  2:14 PM EDT ----- Regarding: CXR Please let his wife know I ordered CXR to be done on the same day as blood test at [facility] Thanks

## 2015-03-17 NOTE — Telephone Encounter (Signed)
This encounter was created in error - please disregard.

## 2015-03-17 NOTE — Telephone Encounter (Signed)
s.w. pt and advised on Sept appt....ok and aware °

## 2015-03-17 NOTE — Telephone Encounter (Signed)
Patient calling about xray results  [phone]

## 2015-03-18 ENCOUNTER — Telehealth: Payer: Self-pay | Admitting: Cardiology

## 2015-03-18 ENCOUNTER — Ambulatory Visit (INDEPENDENT_AMBULATORY_CARE_PROVIDER_SITE_OTHER): Payer: Commercial Managed Care - HMO | Admitting: *Deleted

## 2015-03-18 ENCOUNTER — Other Ambulatory Visit: Payer: Self-pay | Admitting: *Deleted

## 2015-03-18 ENCOUNTER — Encounter: Payer: Self-pay | Admitting: Hematology and Oncology

## 2015-03-18 ENCOUNTER — Other Ambulatory Visit: Payer: Self-pay | Admitting: Hematology and Oncology

## 2015-03-18 ENCOUNTER — Telehealth: Payer: Self-pay | Admitting: Hematology and Oncology

## 2015-03-18 DIAGNOSIS — I5023 Acute on chronic systolic (congestive) heart failure: Secondary | ICD-10-CM | POA: Diagnosis not present

## 2015-03-18 DIAGNOSIS — I472 Ventricular tachycardia, unspecified: Secondary | ICD-10-CM

## 2015-03-18 DIAGNOSIS — C9210 Chronic myeloid leukemia, BCR/ABL-positive, not having achieved remission: Secondary | ICD-10-CM

## 2015-03-18 MED ORDER — BOSUTINIB 500 MG PO TABS: 500.0000 mg | ORAL_TABLET | Freq: Every day | ORAL | Status: DC

## 2015-03-18 NOTE — Telephone Encounter (Signed)
New Rx for Bosutinib faxed to [facility] outpatient pharmacy.

## 2015-03-18 NOTE — Telephone Encounter (Signed)
Spoke with pt and reminded pt of remote transmission that is due today. Pt verbalized understanding.   

## 2015-03-18 NOTE — Telephone Encounter (Signed)
I spoke with the patient's wife over the telephone. I reviewed the chest x-ray and is concerned about new pleural effusion on the contralateral chest. I recommend we stop Sprycel immediately. I will get insurance preauthorization for Bosutinib. The patient has received patient education handout yesterday. As soon as the patient received his medications, he will let us know. I will see him back next month as scheduled to assess toxicity.

## 2015-03-18 NOTE — Telephone Encounter (Signed)
New Rx for Bosutinib faxed to [facility] outpatient pharmacy.

## 2015-03-18 NOTE — Progress Notes (Signed)
Per [employer] bosulif was approved thru 09/14/15. I will send to medical records

## 2015-03-18 NOTE — Progress Notes (Signed)
Remote ICD transmission.   

## 2015-03-18 NOTE — Progress Notes (Signed)
I faxed prior auth req to [facility] for bonsulif-- Questionnaire submitted. PA Case [id number] Status: Approved. Authorization and Notifications Pending.

## 2015-03-19 ENCOUNTER — Telehealth: Payer: Self-pay | Admitting: *Deleted

## 2015-03-19 NOTE — Telephone Encounter (Signed)
Submitted [employer] referral thru acuity connect for authorization on 03/16/15 to Dr. Herbert Pun with authorization [id number]  Requesting provider: Flonnie Hailstone  Treating provider: Herbert Pun  Number of visits:6  Start Date: 03/18/15  End Date: 09/14/15  Dx: I25.5-Ischemic cardiomyopathy       I50.23-acute on chronic systolic heart failure       Z95.810- Presence of automatic cardiac defibrillator

## 2015-03-19 NOTE — Telephone Encounter (Signed)
Submitted [employer] referral thru acuity connect for authorization on 03/17/15 to Tammy Parrett/Dr.Sood Pulmonologist with authorization [id number]  Requesting provider: Flonnie Hailstone  Treating provider: Tammy Parrett,NP/Dr. Elisabeth Cara Sood,MD  Number of visits: 6  Start Date: 03/23/15  End Date:09/18/14  Dx: O35.0- other specified pleural conditions       J43.9-Emphysema,unspecied  Copy has been sent to [place] for records/review

## 2015-03-24 ENCOUNTER — Telehealth: Payer: Self-pay | Admitting: *Deleted

## 2015-03-24 DIAGNOSIS — C9210 Chronic myeloid leukemia, BCR/ABL-positive, not having achieved remission: Secondary | ICD-10-CM

## 2015-03-24 LAB — CUP PACEART REMOTE DEVICE CHECK
Battery Remaining Longevity: 133 mo
Battery Voltage: 3.04 V
Brady Statistic RV Percent Paced: 0.01 %
Date Time Interrogation Session: 20160810182452
HighPow Impedance: 41 Ohm
Lead Channel Impedance Value: 418 Ohm
Lead Channel Sensing Intrinsic Amplitude: 7.125 mV
Lead Channel Setting Pacing Amplitude: 2 V
Lead Channel Setting Pacing Pulse Width: 0.4 ms
MDC IDC MSMT LEADCHNL RV IMPEDANCE VALUE: 361 Ohm
MDC IDC MSMT LEADCHNL RV PACING THRESHOLD AMPLITUDE: 0.625 V
MDC IDC MSMT LEADCHNL RV PACING THRESHOLD PULSEWIDTH: 0.4 ms
MDC IDC MSMT LEADCHNL RV SENSING INTR AMPL: 7.125 mV
MDC IDC SET LEADCHNL RV SENSING SENSITIVITY: 0.3 mV
MDC IDC SET ZONE DETECTION INTERVAL: 340 ms
Zone Setting Detection Interval: 300 ms
Zone Setting Detection Interval: 370 ms

## 2015-03-24 MED ORDER — BOSUTINIB 500 MG PO TABS: 500.0000 mg | ORAL_TABLET | Freq: Every day | ORAL | Status: DC

## 2015-03-24 NOTE — Telephone Encounter (Signed)
Called pt in regards to decreased thoracic impedence since mid May on remote transmission-- pt reports pleural effusions r/t leukemia and treatment. Noted on transmission- SK to review.

## 2015-03-24 NOTE — Telephone Encounter (Signed)
Spouse Anthony Wolfe called reporting "he was changed to Bosulif because Sprycel caused fluid in his lungs and heart.  We haven't received this yet and the [facility] says they haven't received the prescription."  This nurse observed script was printed.  Will resend as eRx.  [employer] ID number is [id number] per Anthony Wolfe.

## 2015-03-25 ENCOUNTER — Other Ambulatory Visit (INDEPENDENT_AMBULATORY_CARE_PROVIDER_SITE_OTHER): Payer: Commercial Managed Care - HMO

## 2015-03-25 ENCOUNTER — Ambulatory Visit (INDEPENDENT_AMBULATORY_CARE_PROVIDER_SITE_OTHER)
Admission: RE | Admit: 2015-03-25 | Discharge: 2015-03-25 | Disposition: A | Discharge status: Home / Self Care | Payer: Commercial Managed Care - HMO | Source: Ambulatory Visit | Attending: Adult Health | Admitting: Adult Health

## 2015-03-25 ENCOUNTER — Encounter: Payer: Self-pay | Admitting: Adult Health

## 2015-03-25 ENCOUNTER — Ambulatory Visit (INDEPENDENT_AMBULATORY_CARE_PROVIDER_SITE_OTHER): Payer: Commercial Managed Care - HMO | Admitting: Adult Health

## 2015-03-25 ENCOUNTER — Telehealth: Payer: Self-pay | Admitting: Adult Health

## 2015-03-25 ENCOUNTER — Telehealth: Payer: Self-pay | Admitting: Pulmonary Disease

## 2015-03-25 VITALS — BP 138/88 | HR 82 | Temp 98.2°F | Ht 69.0 in | Wt 180.0 lb

## 2015-03-25 DIAGNOSIS — J90 Pleural effusion, not elsewhere classified: Secondary | ICD-10-CM | POA: Diagnosis not present

## 2015-03-25 DIAGNOSIS — J439 Emphysema, unspecified: Secondary | ICD-10-CM | POA: Diagnosis not present

## 2015-03-25 DIAGNOSIS — J948 Other specified pleural conditions: Secondary | ICD-10-CM

## 2015-03-25 DIAGNOSIS — R0602 Shortness of breath: Secondary | ICD-10-CM | POA: Diagnosis not present

## 2015-03-25 LAB — LACTATE DEHYDROGENASE: LDH: 108 U/L (ref 94–250)

## 2015-03-25 LAB — PROTEIN, TOTAL: TOTAL PROTEIN: 6.2 g/dL (ref 6.0–8.3)

## 2015-03-25 NOTE — Assessment & Plan Note (Signed)
Emphysema on CT with restrictive dz on spirometry  Does need PFT in future if possible  Intolerant to inhalers , appears stable  Consider adding spiriva handihaler if needed in future .

## 2015-03-25 NOTE — Telephone Encounter (Signed)
Okay to double book visit with me. 

## 2015-03-25 NOTE — Progress Notes (Signed)
Reviewed and agree with assessment/plan. 

## 2015-03-25 NOTE — Telephone Encounter (Signed)
lmtcb x1 

## 2015-03-25 NOTE — Telephone Encounter (Signed)
Spoke with pt's wife. She wanted to make sure that we send a referral for the pt have this thoracentesis. Advised her that we would take care of all of the for them and to not worry. She verbalized understanding.

## 2015-03-25 NOTE — Telephone Encounter (Signed)
Per OV with TP today: follow up Dr. Halford Chessman  In 2-3 weeks with chest xray   --  Dr. Halford Chessman can pt be double booked with you? thanks

## 2015-03-25 NOTE — Patient Instructions (Signed)
We are setting you up for a Thoracentesis on left lung .  follow up Dr. Halford Chessman  In 2-3 weeks with chest xray  Please contact office for sooner follow up if symptoms do not improve or worsen or seek emergency care

## 2015-03-25 NOTE — Assessment & Plan Note (Signed)
New left pleural effusion ? Etiology  Possibly secondary to Sprycel .  Does not appear to be in decompensated CHF on exam  Right chronic effusion s/p talc pleurodesis appears stable  Will set up for a thoracentesis -therapeutic/Dx with fluid analysis /cytoloy with up to 1.5L removal   Plan  Set up for IR to complete thoracentesis .  follow up in 2-3 weeks with CXR

## 2015-03-25 NOTE — Progress Notes (Signed)
Subjective:    Patient ID: Anthony Wolfe, male    DOB: [birth year], 69 y.o.   MRN: [id number]  HPI   69 yo male former smoker  with Emphysema /COPD and recurrent Pleural Effusion  Has CLL followed by Oncology  Has CHF -systolic/diastolic  TEST /Events :   05/17/14 Echo >> EF 35 to 09%, grade 2 diastolic dysfx 73/53/29 Rt thoracentesis >> glucose 100, protein 3.7, LDH 108, WBC 517 (90% lymphs), cytology >> reactive mesothelial cells positive for calretinin, cytokeratin 5/6, WT-1 06/10/14 CT chest >> Rt effusion, ASD RLL, emphysema, 3 mm nodule Lt lung 07/15/14 Rt thoracentesis >> 1.5 liters, LDH 101, WBC 947 (72L, 81M), ADA 10.4, cytology >> reactive mesothelial cells 11/2014 Talc Pleurodesis  02/2015 >Spirometry with FEV1 48%, rato 74 , FVC 51 %  03/25/2015 Follow up : Pleural Effusion  Pt presents for an acute office visit for recurrent pleural effsion  He was seen by PCP with worsening dyspena. CXR showed stable pleural effusion on right but increased effusion on left. He says breathing has worsened over last month with more DOE especially with inclines.  Has some cough and congestion  Previously on Stiolto for COPD but did not tolerate. Feels inhalers make his breathing worse.  Recent spirometry showed restrictive disease . Did not return for PFT in past as recommended.  Recently taken off Sprycel due to multiple symptoms including pleural effusion .  Changed to Bosulif. By oncology .  CXR today shows bilateral effusions , with little change from 01/15/15 .  He denies fever, chest pain, orthopnea, edema or discolored mucus .  Followed by cards for CHF , remains on lasix 20mg  daily . Denies increased leg swelling.    Review of Systems Constitutional:   No  weight loss, night sweats,  Fevers, chills,  +fatigue, or  lassitude.  HEENT:   No headaches,  Difficulty swallowing,  Tooth/dental problems, or  Sore throat,                No sneezing, itching, ear ache, nasal congestion, post  nasal drip,   CV:  No chest pain,  Orthopnea, PND, swelling in lower extremities, anasarca, dizziness, palpitations, syncope.   GI  No heartburn, indigestion, abdominal pain, nausea, vomiting, diarrhea, change in bowel habits, loss of appetite, bloody stools.   Resp: .  No chest wall deformity  Skin: no rash or lesions.  GU: no dysuria, change in color of urine, no urgency or frequency.  No flank pain, no hematuria   MS:  No joint pain or swelling.  No decreased range of motion.  No back pain.  Psych:  No change in mood or affect. No depression or anxiety.  No memory loss.         Objective:   Physical Exam  GEN: A/Ox3; pleasant , NAD, well nourished , elderly   HEENT:  [place]/AT,  EACs-clear, TMs-wnl, NOSE-clear, THROAT-clear, no lesions, no postnasal drip or exudate noted.   NECK:  Supple w/ fair ROM; no JVD; normal carotid impulses w/o bruits; no thyromegaly or nodules palpated; no lymphadenopathy.  RESP  Bibasilar crackles L>R no accessory muscle use, no dullness to percussion  CARD:  RRR, no m/r/g  , no peripheral edema, pulses intact, no cyanosis or clubbing.  GI:   Soft & nt; nml bowel sounds; no organomegaly or masses detected.  Musco: Warm bil, no deformities or joint swelling noted.   Neuro: alert, no focal deficits noted.    Skin: Warm, no  lesions or rashes        Assessment & Plan:

## 2015-03-26 ENCOUNTER — Telehealth: Payer: Self-pay | Admitting: *Deleted

## 2015-03-26 NOTE — Telephone Encounter (Signed)
Called pt/wife to check on status of new Rx for Bosutinib.  LVM on cell phone to ask wife to let us know if they have gotten med or heard anything about delivery yet.  It looks like it has been approved and was sent to [place].

## 2015-03-26 NOTE — Telephone Encounter (Signed)
Attempted to call pt. No answer, no option to leave a message. Will try back. 

## 2015-03-27 NOTE — Telephone Encounter (Signed)
ATC, no answer after several ring.  No option to leave voicemail wcb

## 2015-03-30 ENCOUNTER — Telehealth: Payer: Self-pay | Admitting: Hematology and Oncology

## 2015-03-30 ENCOUNTER — Telehealth: Payer: Self-pay | Admitting: Family Medicine

## 2015-03-30 ENCOUNTER — Ambulatory Visit (HOSPITAL_COMMUNITY)
Admission: RE | Admit: 2015-03-30 | Discharge: 2015-03-30 | Disposition: A | Discharge status: Home / Self Care | Payer: Commercial Managed Care - HMO | Source: Ambulatory Visit | Attending: Adult Health | Admitting: Adult Health

## 2015-03-30 ENCOUNTER — Ambulatory Visit (HOSPITAL_BASED_OUTPATIENT_CLINIC_OR_DEPARTMENT_OTHER): Payer: Commercial Managed Care - HMO | Admitting: Hematology and Oncology

## 2015-03-30 ENCOUNTER — Ambulatory Visit (HOSPITAL_COMMUNITY)
Admission: RE | Admit: 2015-03-30 | Discharge: 2015-03-30 | Disposition: A | Discharge status: Home / Self Care | Payer: Commercial Managed Care - HMO | Source: Ambulatory Visit | Attending: Radiology | Admitting: Radiology

## 2015-03-30 VITALS — BP 156/67 | HR 72 | Temp 98.3°F | Resp 18 | Wt 178.2 lb

## 2015-03-30 DIAGNOSIS — Z9889 Other specified postprocedural states: Secondary | ICD-10-CM | POA: Diagnosis not present

## 2015-03-30 DIAGNOSIS — K521 Toxic gastroenteritis and colitis: Secondary | ICD-10-CM

## 2015-03-30 DIAGNOSIS — C9110 Chronic lymphocytic leukemia of B-cell type not having achieved remission: Secondary | ICD-10-CM | POA: Insufficient documentation

## 2015-03-30 DIAGNOSIS — C9290 Myeloid leukemia, unspecified, not having achieved remission: Secondary | ICD-10-CM | POA: Diagnosis not present

## 2015-03-30 DIAGNOSIS — I517 Cardiomegaly: Secondary | ICD-10-CM | POA: Diagnosis not present

## 2015-03-30 DIAGNOSIS — I509 Heart failure, unspecified: Secondary | ICD-10-CM | POA: Insufficient documentation

## 2015-03-30 DIAGNOSIS — J90 Pleural effusion, not elsewhere classified: Secondary | ICD-10-CM | POA: Diagnosis not present

## 2015-03-30 DIAGNOSIS — R0600 Dyspnea, unspecified: Secondary | ICD-10-CM | POA: Diagnosis not present

## 2015-03-30 DIAGNOSIS — J948 Other specified pleural conditions: Secondary | ICD-10-CM | POA: Diagnosis not present

## 2015-03-30 LAB — BODY FLUID CELL COUNT WITH DIFFERENTIAL
Eos, Fluid: 0 %
Lymphs, Fluid: 68 %
Monocyte-Macrophage-Serous Fluid: 31 % — ABNORMAL LOW (ref 50–90)
Neutrophil Count, Fluid: 1 % (ref 0–25)
WBC FLUID: 1201 uL — AB (ref 0–1000)

## 2015-03-30 LAB — LACTATE DEHYDROGENASE, PLEURAL OR PERITONEAL FLUID: LD, Fluid: 59 U/L — ABNORMAL HIGH (ref 3–23)

## 2015-03-30 LAB — GLUCOSE, SEROUS FLUID: Glucose, Fluid: 96 mg/dL

## 2015-03-30 LAB — GRAM STAIN

## 2015-03-30 LAB — AMYLASE, PLEURAL FLUID: AMYLASE, PLEURAL FLUID: 15 U/L

## 2015-03-30 LAB — PH, BODY FLUID: PH, FLUID: 8

## 2015-03-30 LAB — PROTEIN, BODY FLUID: Total protein, fluid: 3.2 g/dL

## 2015-03-30 MED ORDER — DIPHENOXYLATE-ATROPINE 2.5-0.025 MG PO TABS: 1.0000 | ORAL_TABLET | Freq: Four times a day (QID) | ORAL | Status: DC

## 2015-03-30 NOTE — Progress Notes (Signed)
Quick Note:  Attempted to call pt but was unable to leave VM. ______

## 2015-03-30 NOTE — Assessment & Plan Note (Signed)
This is a well-known side effect of treatment. I am worried about risk of dehydration. I recommend he hold his diuretic (Lasix) and Cozaar to reduce risk of renal failure. I encouraged him to drink plenty of fluids and avoid fatty food. I will resume his treatment in 3 days with half the dose of chemotherapy as above. I also recommend he take regular Lomotil 3 times a day, on a scheduled basis starting on 04/02/2015 and I will see him back next week on 04/06/2015 for further assessment

## 2015-03-30 NOTE — Telephone Encounter (Signed)
Pt at [facility] today for continuing treatment for C92.90 and D63.0  Needs updated referral from [facility].  After SEVERAL phone calls.  Approval for 6 more visits 03/10/15 - 09/28/15  Auth # [id number].  Dr Alvy Bimler office made aware.

## 2015-03-30 NOTE — Progress Notes (Signed)
[facility] OFFICE PROGRESS NOTE  Patient Care Team: [name], MD as PCP - General (Family Medicine) [name], MD as Attending Physician (Cardiology) [name], MD (Family Medicine) [name], MD (Cardiothoracic Surgery) [name], MD as Attending Physician (Vascular Surgery) [name], MD as Consulting Physician (Hematology and Oncology) Deboraha Sprang, MD as Consulting Physician (Cardiology)  SUMMARY OF ONCOLOGIC HISTORY:   Chronic myelogenous leukemia   05/04/2012 Bone Marrow Biopsy BM confirmed diagnosis of CML in Chronic phase. BCR/ABL by PCR detected abnormalitis with b2a2 & b3a2 subtypes   05/09/2012 - 03/16/2015 Chemotherapy He was started on treatment with Dasatinib 100 mg daily   06/06/2012 Adverse Reaction Dose of medication was reduced to 50 mg daily due to pancytopenia   01/24/2013 Progression Patient was noted to have elevated blood count which and was subsequently found to be noncompliant to treatment. The patient has not been on treatment for several months because his prescription ran out. He was restarted back on treatment   01/16/2014 Progression Bcr/ABL by PCR is worse. Dose of Dasatinib was increased to 100 mg.   02/26/2014 Tumor Marker Blood work for ABL kinase mutation was negative.   10/29/2014 Tumor Marker BCR/ABL b2a2 & b3a2 0.29%, IS 0.1624%, not in MMR yet but improving   11/05/2014 Adverse Reaction He had thoracentesis due to pleural effusion   11/27/2014 Surgery He underwent right video-assisted thoracoscopy, Drainage of pleural effusion, Pleural biopsy, Diaphragm biopsy, Lung biopsy & Talc pleurodesis   01/30/2015 Tumor Marker BCR/ABL b2a2 & b3a2 0.78%, IS 0.4368%, not in MMR    03/11/2015 Pathology Results BCR/ABL b2a2 0.19%, IS 0.1064%, not in MMR    03/16/2015 Adverse Reaction Dasatinib was stopped due to recurrent pleural effusion   03/28/2015 -  Chemotherapy He started on Bosutinib   03/30/2015 Procedure He has  therapeutic thoracentesis of the right lung with 1 liter of fluid removed   03/30/2015 Adverse Reaction Bosutinib is placed on hold, to be restart on 8/25 at 250 mg due to severe diarrhea    INTERVAL HISTORY: Please see below for problem oriented charting. He is seen urgently today because of uncontrolled diarrhea since the weekend. The patient started his medication on 03/28/2015. On 03/28/15 and 03/29/2015, he had profuse diarrhea for 6-8 times of watery types after each dose of chemotherapy. He is afraid to take further chemotherapy today. He complained of thirst but denies symptoms of lightheadedness or dizziness. There were no associated abdominal cramp and there were no evidence of blood in his stool.  he denies nausea or vomiting. Denies any chest pain, cough or shortness of breath  REVIEW OF SYSTEMS:   Constitutional: Denies fevers, chills or abnormal weight loss Eyes: Denies blurriness of vision Ears, nose, mouth, throat, and face: Denies mucositis or sore throat Respiratory: Denies cough, dyspnea or wheezes Cardiovascular: Denies palpitation, chest discomfort or lower extremity swelling Skin: Denies abnormal skin rashes Lymphatics: Denies new lymphadenopathy or easy bruising Neurological:Denies numbness, tingling or new weaknesses Behavioral/Psych: Mood is stable, no new changes  All other systems were reviewed with the patient and are negative.  I have reviewed the past medical history, past surgical history, social history and family history with the patient and they are unchanged from previous note.  ALLERGIES:  is allergic to norvasc and sulfa antibiotics.  MEDICATIONS:  Current Outpatient Prescriptions  Medication Sig Dispense Refill  . albuterol (PROVENTIL HFA;VENTOLIN HFA) 108 (90 BASE) MCG/ACT inhaler Inhale 2 puffs into the lungs every  6 (six) hours as needed for wheezing or shortness of breath. (Patient not taking: Reported on 03/25/2015) 1 Inhaler 0  . aspirin 81  MG chewable tablet Chew 1 tablet (81 mg total) by mouth daily.    Marland Kitchen atorvastatin (LIPITOR) 80 MG tablet TAKE 1 TABLET EVERY DAY  AT  6:00  PM 90 tablet 1  . bosutinib (BOSULIF) 500 MG tablet Take 1 tablet (500 mg total) by mouth daily with breakfast. Take with food. (Patient not taking: Reported on 03/25/2015) 30 tablet 6  . carvedilol (COREG) 6.25 MG tablet Take 1 tablet (6.25 mg total) by mouth 2 (two) times daily. 180 tablet 1  . clopidogrel (PLAVIX) 75 MG tablet Take 1 tablet (75 mg total) by mouth daily. 30 tablet 11  . diphenoxylate-atropine (LOMOTIL) 2.5-0.025 MG per tablet Take 1 tablet by mouth 4 (four) times daily. 90 tablet 3  . furosemide (LASIX) 20 MG tablet Take 1 tablet (20 mg total) by mouth daily. 30 tablet 3  . guaiFENesin (MUCINEX) 600 MG 12 hr tablet Take 1200 mg by mouth 2-3 times a day as needed for cough or to loosen phlegm    . losartan (COZAAR) 25 MG tablet Take 1 tablet (25 mg total) by mouth daily. Stop lisinopril. 30 tablet 5  . mirtazapine (REMERON) 30 MG tablet TAKE ONE TABLET BY MOUTH AT BEDTIME 30 tablet 11  . nitroGLYCERIN (NITROSTAT) 0.4 MG SL tablet Place 1 tablet (0.4 mg total) under the tongue every 5 (five) minutes as needed for chest pain. 25 tablet 4  . potassium chloride SA (K-DUR,KLOR-CON) 20 MEQ tablet Take 20 mEq by mouth daily.    . Tiotropium Bromide-Olodaterol (STIOLTO RESPIMAT) 2.5-2.5 MCG/ACT AERS Inhale 2 puffs into the lungs daily.     No current facility-administered medications for this visit.    PHYSICAL EXAMINATION: ECOG PERFORMANCE STATUS: 1 - Symptomatic but completely ambulatory  Filed Vitals:   03/30/15 1238  BP: 156/67  Pulse: 72  Temp: 98.3 F (36.8 C)  Resp: 18   Filed Weights   03/30/15 1238  Weight: 178 lb 3 oz (80.825 kg)    GENERAL:alert, no distress and comfortable SKIN: skin color, texture, turgor are normal, no rashes or significant lesions EYES: normal, Conjunctiva are pink and non-injected, sclera  clear OROPHARYNX:no exudate, no erythema and lips, buccal mucosa, and tongue normal . No evidence of mucositis. Noted dry mucous membrane NECK: supple, thyroid normal size, non-tender, without nodularity LYMPH:  no palpable lymphadenopathy in the cervical, axillary or inguinal LUNGS: clear to auscultation and percussion with normal breathing effort HEART: regular rate & rhythm and no murmurs and no lower extremity edema ABDOMEN:abdomen soft, non-tender and normal bowel sounds. He has very active bowel sounds Musculoskeletal:no cyanosis of digits and no clubbing  NEURO: alert & oriented x 3 with fluent speech, no focal motor/sensory deficits  LABORATORY DATA:  I have reviewed the data as listed    Component Value Date/Time   NA 143 03/06/2015 0905   NA 136 12/06/2014 0442   K 3.7 03/06/2015 0905   K 3.4* 12/06/2014 0442   CL 102 12/06/2014 0442   CL 110* 11/14/2012 0918   CO2 23 03/06/2015 0905   CO2 24 12/06/2014 0442   GLUCOSE 118 03/06/2015 0905   GLUCOSE 92 12/06/2014 0442   GLUCOSE 108* 11/14/2012 0918   BUN 11.3 03/06/2015 0905   BUN 14 12/06/2014 0442   CREATININE 1.0 03/06/2015 0905   CREATININE 1.16 12/06/2014 0442   CREATININE 1.16 06/19/2014 1610  CALCIUM 8.4 03/06/2015 0905   CALCIUM 7.6* 12/06/2014 0442   PROT 6.2 03/25/2015 1240   PROT 6.1* 03/06/2015 0905   ALBUMIN 3.3* 03/06/2015 0905   ALBUMIN 2.5* 11/29/2014 0400   AST 18 03/06/2015 0905   AST 22 11/29/2014 0400   ALT 17 03/06/2015 0905   ALT 16 11/29/2014 0400   ALKPHOS 84 03/06/2015 0905   ALKPHOS 65 11/29/2014 0400   BILITOT 0.48 03/06/2015 0905   BILITOT 0.8 11/29/2014 0400   GFRNONAA 62* 12/06/2014 0442   GFRNONAA 63 06/19/2014 1610   GFRAA 72* 12/06/2014 0442   GFRAA 73 06/19/2014 1610    No results found for: SPEP, UPEP  Lab Results  Component Value Date   WBC 7.0 03/06/2015   NEUTROABS 4.2 03/06/2015   HGB 11.4* 03/06/2015   HCT 34.8* 03/06/2015   MCV 99.0* 03/06/2015   PLT 133*  03/06/2015      Chemistry      Component Value Date/Time   NA 143 03/06/2015 0905   NA 136 12/06/2014 0442   K 3.7 03/06/2015 0905   K 3.4* 12/06/2014 0442   CL 102 12/06/2014 0442   CL 110* 11/14/2012 0918   CO2 23 03/06/2015 0905   CO2 24 12/06/2014 0442   BUN 11.3 03/06/2015 0905   BUN 14 12/06/2014 0442   CREATININE 1.0 03/06/2015 0905   CREATININE 1.16 12/06/2014 0442   CREATININE 1.16 06/19/2014 1610      Component Value Date/Time   CALCIUM 8.4 03/06/2015 0905   CALCIUM 7.6* 12/06/2014 0442   ALKPHOS 84 03/06/2015 0905   ALKPHOS 65 11/29/2014 0400   AST 18 03/06/2015 0905   AST 22 11/29/2014 0400   ALT 17 03/06/2015 0905   ALT 16 11/29/2014 0400   BILITOT 0.48 03/06/2015 0905   BILITOT 0.8 11/29/2014 0400       RADIOGRAPHIC STUDIES: I have personally reviewed the radiological images as listed and agreed with the findings in the report. Dg Chest 1 View  03/30/2015   CLINICAL DATA:  Post left thoracentesis.  EXAM: CHEST  1 VIEW  COMPARISON:  03/25/2015  FINDINGS: Left AICD remains in place, unchanged. Prior CABG. Mild cardiomegaly. Small bilateral pleural effusions, decreased on the left following thoracentesis. No pneumothorax. Rounded opacity at the right lung base might represent partial loculation of the right effusion.  IMPRESSION: No pneumothorax following left thoracentesis.   Electronically Signed   By: Rolm Baptise M.D.   On: 03/30/2015 12:06   US Thoracentesis Asp Pleural Space W/img Guide  03/30/2015   INDICATION: CHF, CLL, dyspnea, left pleural effusion. Request is made for diagnostic and therapeutic left thoracentesis.  EXAM: ULTRASOUND GUIDED DIAGNOSTIC AND THERAPEUTIC LEFT THORACENTESIS  COMPARISON:  Prior right thoracentesis on 11/05/2014  MEDICATIONS: None  COMPLICATIONS: None immediate  TECHNIQUE: Informed written consent was obtained from the patient after a discussion of the risks, benefits and alternatives to treatment. A timeout was performed prior  to the initiation of the procedure.  Initial ultrasound scanning demonstrates a moderate-to-large left pleural effusion. The lower chest was prepped and draped in the usual sterile fashion. 1% lidocaine was used for local anesthesia.  An ultrasound image was saved for documentation purposes. A 6 Fr Safe-T-Centesis catheter was introduced. The thoracentesis was performed. The catheter was removed and a dressing was applied. The patient tolerated the procedure well without immediate post procedural complication. The patient was escorted to have an upright chest radiograph.  FINDINGS: A total of approximately 1 liter of turbid, amber  fluid was removed. Requested samples were sent to the laboratory.  IMPRESSION: Successful ultrasound-guided diagnostic and therapeutic left sided thoracentesis yielding 1 liter of pleural fluid.  Read by: Rowe Robert, PA-C   Electronically Signed   By: Jerilynn Mages.  Shick M.D.   On: 03/30/2015 11:44     ASSESSMENT & PLAN:  Chronic myelogenous leukemia Unfortunately, the patient develops significant complication with profuse diarrhea which he attributed to side effects of medication. I recommend the patient to hold treatment for 3 days and resume on 04/02/2015 at 250 mg daily I will see him next week for further assessment  Diarrhea due to drug This is a well-known side effect of treatment. I am worried about risk of dehydration. I recommend he hold his diuretic (Lasix) and Cozaar to reduce risk of renal failure. I encouraged him to drink plenty of fluids and avoid fatty food. I will resume his treatment in 3 days with half the dose of chemotherapy as above. I also recommend he take regular Lomotil 3 times a day, on a scheduled basis starting on 04/02/2015 and I will see him back next week on 04/06/2015 for further assessment  Pleural effusion, right  Thankfully, his pleural effusion has not recurred.  I will monitor that carefully.   Orders Placed This Encounter  Procedures   . CBC with Differential/Platelet    Standing Status: Future     Number of Occurrences:      Standing Expiration Date: 05/03/2016  . Comprehensive metabolic panel    Standing Status: Future     Number of Occurrences:      Standing Expiration Date: 05/03/2016  . Magnesium    Standing Status: Future     Number of Occurrences:      Standing Expiration Date: 05/03/2016   All questions were answered. The patient knows to call the clinic with any problems, questions or concerns. No barriers to learning was detected. I spent 20 minutes counseling the patient face to face. The total time spent in the appointment was 30 minutes and more than 50% was on counseling and review of test results     [facility], [place], MD 03/30/2015 2:09 PM

## 2015-03-30 NOTE — Assessment & Plan Note (Signed)
Unfortunately, the patient develops significant complication with profuse diarrhea which he attributed to side effects of medication. I recommend the patient to hold treatment for 3 days and resume on 04/02/2015 at 250 mg daily I will see him next week for further assessment

## 2015-03-30 NOTE — Telephone Encounter (Signed)
Gave adn printed appt sched adn avs for pt for Aug

## 2015-03-30 NOTE — Telephone Encounter (Signed)
No answer wcb

## 2015-03-30 NOTE — Assessment & Plan Note (Signed)
Thankfully, his pleural effusion has not recurred.  I will monitor that carefully.

## 2015-03-30 NOTE — Telephone Encounter (Signed)
Gave adn printed appt scheda dna vs for pt for Aug and Sept

## 2015-03-30 NOTE — Procedures (Signed)
US guided diagnostic/therapeutic left thoracentesis performed yielding 1 liter turbid, amber fluid. The fluid was sent to the lab for preordered studies. F/u CXR pending. No immediate complications.

## 2015-03-31 NOTE — Progress Notes (Signed)
Quick Note:  Attempted to call pt but was unable to LVM. ______

## 2015-03-31 NOTE — Telephone Encounter (Signed)
ATC, NA wcb

## 2015-04-01 NOTE — Telephone Encounter (Signed)
Attempted to contact patient No answer Will call back

## 2015-04-02 LAB — OTHER BODY FLUID CHEMISTRY: Miscellaneous Test Results: 11

## 2015-04-02 NOTE — Progress Notes (Signed)
Quick Note:  Called and spoke with pt. Reviewed results and recs. Pt voiced understanding and had no further questions. Appointment was made with VS on 04/20/15 ______

## 2015-04-02 NOTE — Progress Notes (Signed)
Quick Note:  Called and spoke with pt. Reviewed results and recs. Made appointment for 04/20/15 @ 2pm. Pt voiced understanding and had no further questions. ______

## 2015-04-02 NOTE — Telephone Encounter (Signed)
Called and spoke with pt.  Scheduled him with VS on 04/20/15 @ 2pm. Pt voiced understanding and had no further questions.  Nothing further needed

## 2015-04-02 NOTE — Telephone Encounter (Signed)
atc pt, na.  Will close message per triage protocol.

## 2015-04-03 IMAGING — US US THORACENTESIS ASP PLEURAL SPACE W/IMG GUIDE
1 series · 7 of 7 positions shown · non-contrast
Comparison: None.

CLINICAL DATA: Right-sided pleural effusion. Request diagnostic and
therapeutic thoracentesis of up to 1.5 L.

EXAM:
ULTRASOUND GUIDED RIGHT THORACENTESIS

[Series 1: us thoracentesis asp pleural space w/img guide · 0.27mm/px · 7 of 7 slices shown]
[im 1/7]
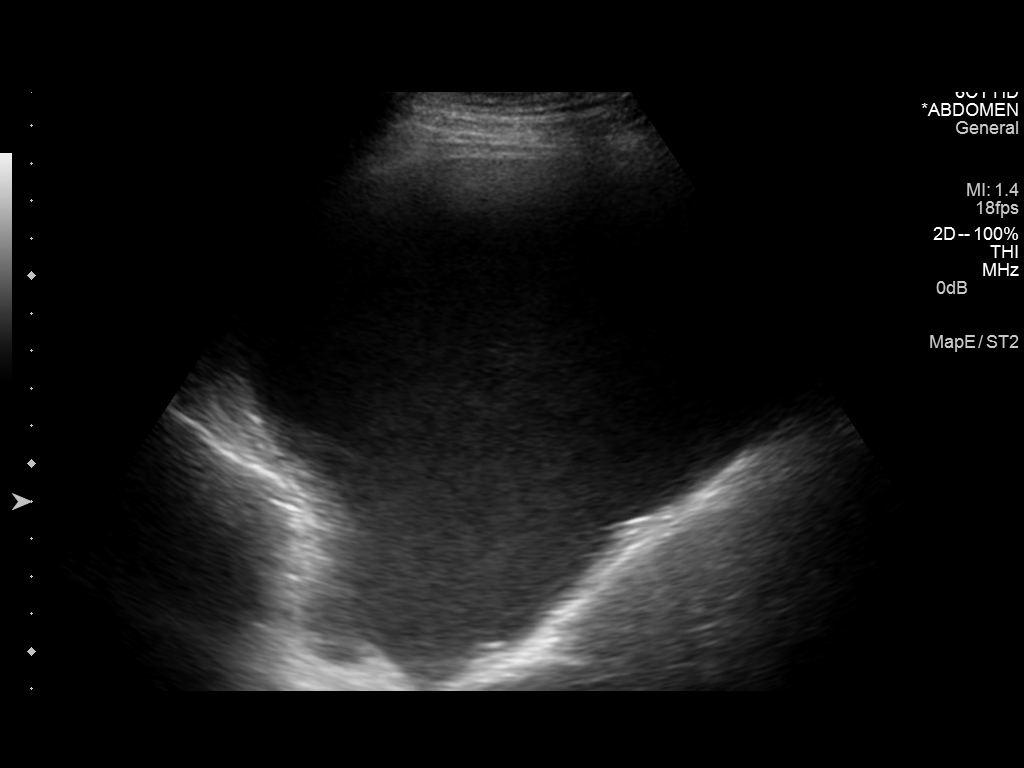
[im 2/7]
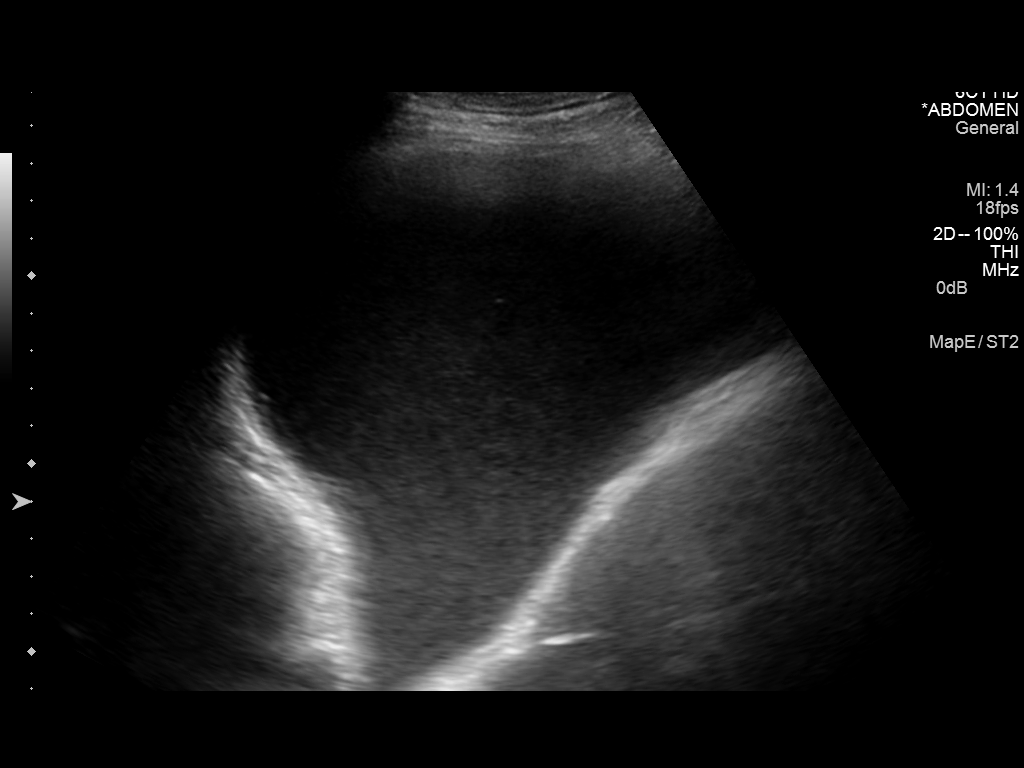
[im 3/7]
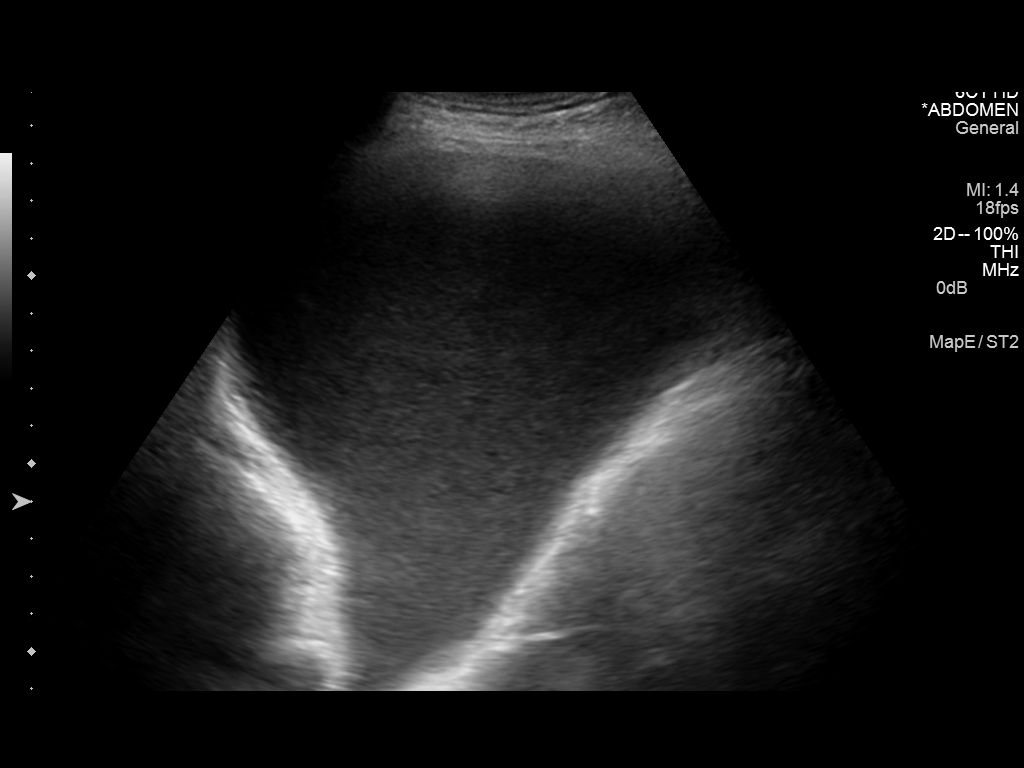
[im 4/7]
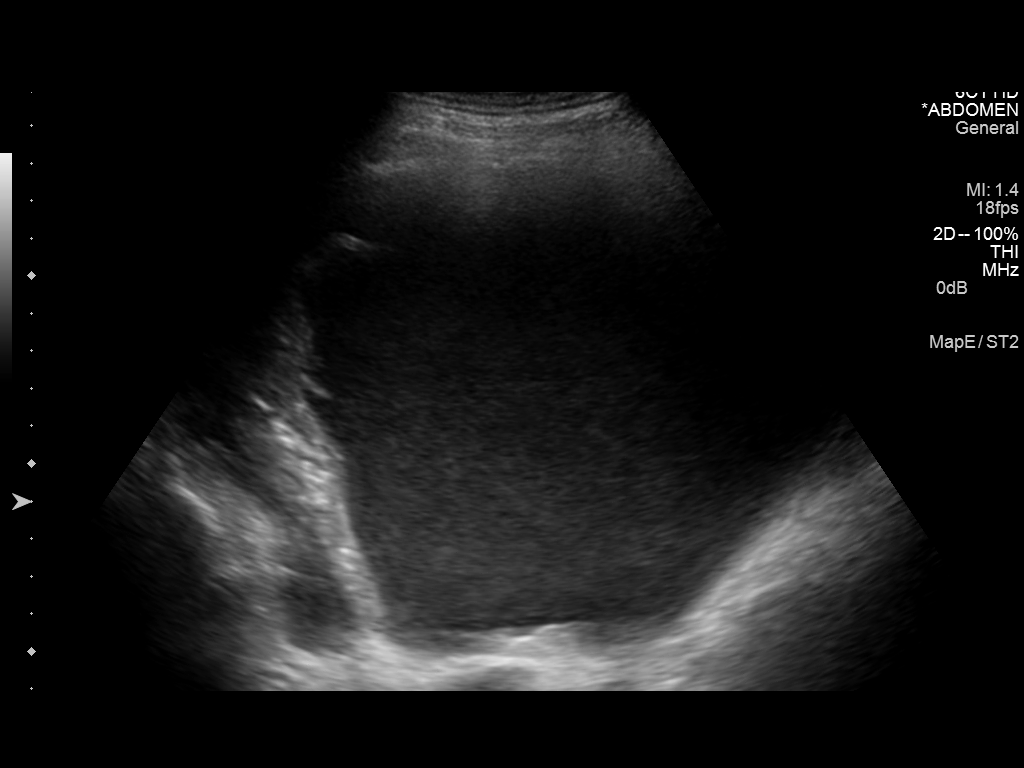
[im 5/7]
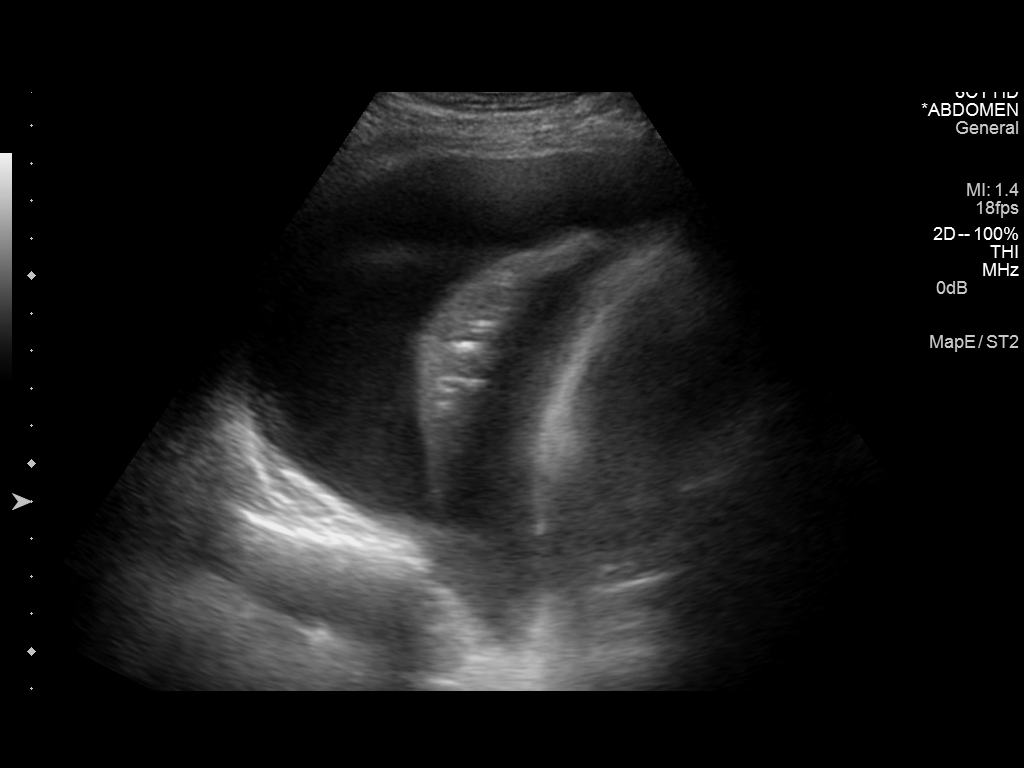
[im 6/7]
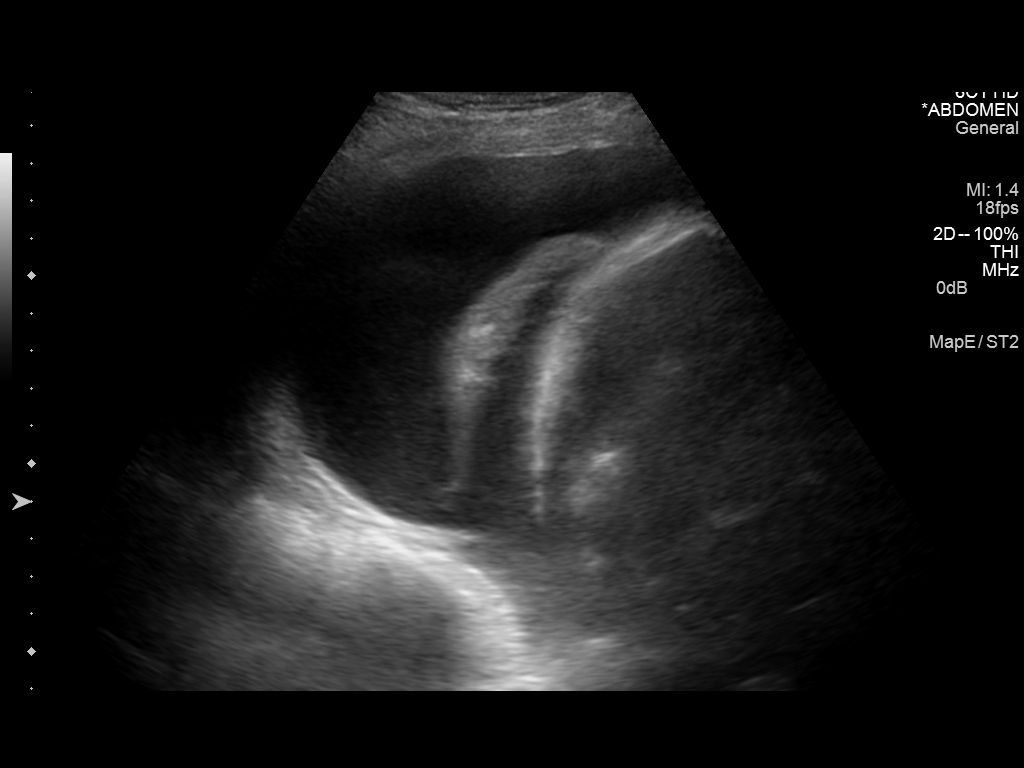
[im 7/7]
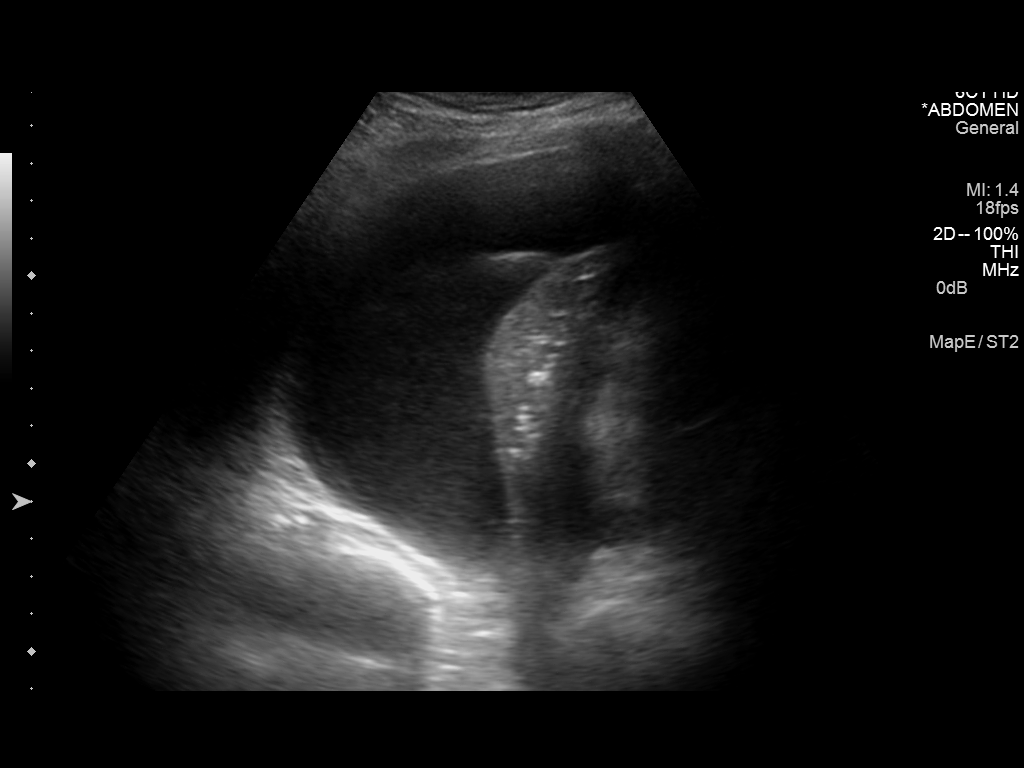

[7 of 7 positions shown; findings below may reference images not displayed]

PROCEDURE:
An ultrasound guided thoracentesis was thoroughly discussed with the
patient and questions answered. The benefits, risks, alternatives
and complications were also discussed. The patient understands and
wishes to proceed with the procedure. Written consent was obtained.

Ultrasound was performed to localize and mark an adequate pocket of
fluid in the right chest. The area was then prepped and draped in
the normal sterile fashion. 1% Lidocaine was used for local
anesthesia. Under ultrasound guidance a 19 gauge Yueh catheter was
introduced. Thoracentesis was performed. The catheter was removed
and a dressing applied.

COMPLICATIONS:
Patient did have a short vasovagal episode but promptly recovered.
He was laid in the Trendelenburg position for several min. His blood
pressure maintains normal level. He was able to complete the
procedure.

Postprocedural chest x-ray reveals no evidence of pneumothorax
FINDINGS: A total of approximately 1.5 L of hazy, amber colored fluid was
removed. A fluid sample wassent for laboratory analysis.
IMPRESSION: Successful ultrasound guided right thoracentesis yielding 1.5 L of
pleural fluid.

## 2015-04-03 NOTE — Progress Notes (Signed)
Quick Note:  Called and spoke to pt on 04/02/15. Reviewed results and recs. Pt voiced understanding ______

## 2015-04-04 LAB — CULTURE, BODY FLUID-BOTTLE: CULTURE: NO GROWTH

## 2015-04-04 LAB — CULTURE, BODY FLUID W GRAM STAIN -BOTTLE

## 2015-04-04 LAB — ANA, BODY FLUID: ANTI-NUCLEAR AB, IGG: NOT DETECTED

## 2015-04-06 ENCOUNTER — Ambulatory Visit (HOSPITAL_BASED_OUTPATIENT_CLINIC_OR_DEPARTMENT_OTHER): Payer: Commercial Managed Care - HMO | Admitting: Hematology and Oncology

## 2015-04-06 ENCOUNTER — Encounter: Payer: Self-pay | Admitting: Hematology and Oncology

## 2015-04-06 ENCOUNTER — Telehealth: Payer: Self-pay | Admitting: Hematology and Oncology

## 2015-04-06 ENCOUNTER — Other Ambulatory Visit (HOSPITAL_BASED_OUTPATIENT_CLINIC_OR_DEPARTMENT_OTHER): Payer: Commercial Managed Care - HMO

## 2015-04-06 VITALS — BP 152/72 | HR 71 | Temp 98.2°F | Resp 18 | Ht 69.0 in | Wt 180.1 lb

## 2015-04-06 DIAGNOSIS — J948 Other specified pleural conditions: Secondary | ICD-10-CM

## 2015-04-06 DIAGNOSIS — C9210 Chronic myeloid leukemia, BCR/ABL-positive, not having achieved remission: Secondary | ICD-10-CM | POA: Diagnosis not present

## 2015-04-06 DIAGNOSIS — D6481 Anemia due to antineoplastic chemotherapy: Secondary | ICD-10-CM | POA: Diagnosis not present

## 2015-04-06 DIAGNOSIS — K521 Toxic gastroenteritis and colitis: Secondary | ICD-10-CM | POA: Diagnosis not present

## 2015-04-06 DIAGNOSIS — L989 Disorder of the skin and subcutaneous tissue, unspecified: Secondary | ICD-10-CM

## 2015-04-06 DIAGNOSIS — J90 Pleural effusion, not elsewhere classified: Secondary | ICD-10-CM

## 2015-04-06 DIAGNOSIS — T451X5A Adverse effect of antineoplastic and immunosuppressive drugs, initial encounter: Secondary | ICD-10-CM

## 2015-04-06 LAB — CBC WITH DIFFERENTIAL/PLATELET
BASO%: 0.8 % (ref 0.0–2.0)
BASOS ABS: 0 10*3/uL (ref 0.0–0.1)
EOS ABS: 0.1 10*3/uL (ref 0.0–0.5)
EOS%: 2.1 % (ref 0.0–7.0)
HCT: 32.7 % — ABNORMAL LOW (ref 38.4–49.9)
HGB: 11 g/dL — ABNORMAL LOW (ref 13.0–17.1)
LYMPH%: 14.6 % (ref 14.0–49.0)
MCH: 32.6 pg (ref 27.2–33.4)
MCHC: 33.6 g/dL (ref 32.0–36.0)
MCV: 96.9 fL (ref 79.3–98.0)
MONO#: 0.4 10*3/uL (ref 0.1–0.9)
MONO%: 8.7 % (ref 0.0–14.0)
NEUT#: 3.5 10*3/uL (ref 1.5–6.5)
NEUT%: 73.8 % (ref 39.0–75.0)
Platelets: 176 10*3/uL (ref 140–400)
RBC: 3.38 10*6/uL — AB (ref 4.20–5.82)
RDW: 14 % (ref 11.0–14.6)
WBC: 4.8 10*3/uL (ref 4.0–10.3)
lymph#: 0.7 10*3/uL — ABNORMAL LOW (ref 0.9–3.3)

## 2015-04-06 LAB — COMPREHENSIVE METABOLIC PANEL (CC13)
ALBUMIN: 3.3 g/dL — AB (ref 3.5–5.0)
ALK PHOS: 84 U/L (ref 40–150)
ALT: 14 U/L (ref 0–55)
AST: 15 U/L (ref 5–34)
Anion Gap: 6 mEq/L (ref 3–11)
BUN: 8.6 mg/dL (ref 7.0–26.0)
CO2: 26 meq/L (ref 22–29)
Calcium: 8.7 mg/dL (ref 8.4–10.4)
Chloride: 111 mEq/L — ABNORMAL HIGH (ref 98–109)
Creatinine: 0.9 mg/dL (ref 0.7–1.3)
EGFR: 86 mL/min/{1.73_m2} — AB (ref >90)
GLUCOSE: 97 mg/dL (ref 70–140)
POTASSIUM: 3.9 meq/L (ref 3.5–5.1)
SODIUM: 144 meq/L (ref 136–145)
Total Bilirubin: 0.38 mg/dL (ref 0.20–1.20)
Total Protein: 6 g/dL — ABNORMAL LOW (ref 6.4–8.3)

## 2015-04-06 LAB — MAGNESIUM (CC13): Magnesium: 2 mg/dl (ref 1.5–2.5)

## 2015-04-06 NOTE — Telephone Encounter (Signed)
Gave and printed appt sched and avs for pt for for Sept

## 2015-04-06 NOTE — Assessment & Plan Note (Signed)
This is likely due to recent treatment. The patient denies recent history of bleeding such as epistaxis, hematuria or hematochezia. He is asymptomatic from the anemia. I will observe for now.  He does not require transfusion now. I will continue the chemotherapy at current dose without dosage adjustment.  If the anemia gets progressive worse in the future, I might have to delay his treatment or adjust the chemotherapy dose.  

## 2015-04-06 NOTE — Assessment & Plan Note (Signed)
His symptoms of diarrhea had resolved. He will continue taking half a tablet of Bosutinib I gave him instruction to discontinue scheduled Lomotil for 1 week and see if his diarrhea returns. If not, he can go up to full pill Bosutinib within a week. I will see him back next month for further assessment

## 2015-04-06 NOTE — Assessment & Plan Note (Signed)
He complained of a lesion behind the right nipple. It was tender last week but then has resolved. I review his last CT scan from April and noted just some tissue behind the right nipple. I reassured the patient and we will observe only for now

## 2015-04-06 NOTE — Progress Notes (Signed)
[facility] OFFICE PROGRESS NOTE  Patient Care Team: Warren T Pickard, MD as PCP - General (Family Medicine) Demetrice H. Edmunds, MD as Attending Physician (Cardiology) Warren T Pickard, MD (Family Medicine) Steven C Hendrickson, MD (Cardiothoracic Surgery) Todd F Early, MD as Attending Physician (Vascular Surgery) Ni Gorsuch, MD as Consulting Physician (Hematology and Oncology) Steven C Klein, MD as Consulting Physician (Cardiology)  SUMMARY OF ONCOLOGIC HISTORY:   Chronic myelogenous leukemia   05/04/2012 Bone Marrow Biopsy BM confirmed diagnosis of CML in Chronic phase. BCR/ABL by PCR detected abnormalitis with b2a2 & b3a2 subtypes   05/09/2012 - 03/16/2015 Chemotherapy He was started on treatment with Dasatinib 100 mg daily   06/06/2012 Adverse Reaction Dose of medication was reduced to 50 mg daily due to pancytopenia   01/24/2013 Progression Patient was noted to have elevated blood count which and was subsequently found to be noncompliant to treatment. The patient has not been on treatment for several months because his prescription ran out. He was restarted back on treatment   01/16/2014 Progression Bcr/ABL by PCR is worse. Dose of Dasatinib was increased to 100 mg.   02/26/2014 Tumor Marker Blood work for ABL kinase mutation was negative.   10/29/2014 Tumor Marker BCR/ABL b2a2 & b3a2 0.29%, IS 0.1624%, not in MMR yet but improving   11/05/2014 Adverse Reaction He had thoracentesis due to pleural effusion   11/27/2014 Surgery He underwent right video-assisted thoracoscopy, Drainage of pleural effusion, Pleural biopsy, Diaphragm biopsy, Lung biopsy & Talc pleurodesis   01/30/2015 Tumor Marker BCR/ABL b2a2 & b3a2 0.78%, IS 0.4368%, not in MMR    03/11/2015 Pathology Results BCR/ABL b2a2 0.19%, IS 0.1064%, not in MMR    03/16/2015 Adverse Reaction Dasatinib was stopped due to recurrent pleural effusion   03/28/2015 -  Chemotherapy He started on Bosutinib   03/30/2015 Procedure He has  therapeutic thoracentesis of the right lung with 1 liter of fluid removed   03/30/2015 Adverse Reaction Bosutinib is placed on hold, to be restart on 8/25 at 250 mg due to severe diarrhea    INTERVAL HISTORY: Please see below for problem oriented charting. He returns for further review. His diarrhea has resolved. He is taking scheduled take Lomotil daily. He denies any chest pain or shortness breath. He complained of a palpable lesion under the right nipple which has been intermittently tender. REVIEW OF SYSTEMS:   Constitutional: Denies fevers, chills or abnormal weight loss Eyes: Denies blurriness of vision Ears, nose, mouth, throat, and face: Denies mucositis or sore throat Respiratory: Denies cough, dyspnea or wheezes Cardiovascular: Denies palpitation, chest discomfort or lower extremity swelling Gastrointestinal:  Denies nausea, heartburn or change in bowel habits Skin: Denies abnormal skin rashes Lymphatics: Denies new lymphadenopathy or easy bruising Neurological:Denies numbness, tingling or new weaknesses Behavioral/Psych: Mood is stable, no new changes  All other systems were reviewed with the patient and are negative.  I have reviewed the past medical history, past surgical history, social history and family history with the patient and they are unchanged from previous note.  ALLERGIES:  is allergic to norvasc and sulfa antibiotics.  MEDICATIONS:  Current Outpatient Prescriptions  Medication Sig Dispense Refill  . aspirin 81 MG chewable tablet Chew 1 tablet (81 mg total) by mouth daily.    . atorvastatin (LIPITOR) 80 MG tablet TAKE 1 TABLET EVERY DAY  AT  6:00  PM 90 tablet 1  . bosutinib (BOSULIF) 500 MG tablet Take 1 tablet (500 mg total) by mouth daily with breakfast. Take   with food. 30 tablet 6  . carvedilol (COREG) 6.25 MG tablet Take 1 tablet (6.25 mg total) by mouth 2 (two) times daily. 180 tablet 1  . clopidogrel (PLAVIX) 75 MG tablet Take 1 tablet (75 mg total)  by mouth daily. 30 tablet 11  . diphenoxylate-atropine (LOMOTIL) 2.5-0.025 MG per tablet Take 1 tablet by mouth 4 (four) times daily. 90 tablet 3  . furosemide (LASIX) 20 MG tablet Take 1 tablet (20 mg total) by mouth daily. 30 tablet 3  . guaiFENesin (MUCINEX) 600 MG 12 hr tablet Take 1200 mg by mouth 2-3 times a day as needed for cough or to loosen phlegm    . losartan (COZAAR) 25 MG tablet Take 1 tablet (25 mg total) by mouth daily. Stop lisinopril. 30 tablet 5  . nitroGLYCERIN (NITROSTAT) 0.4 MG SL tablet Place 1 tablet (0.4 mg total) under the tongue every 5 (five) minutes as needed for chest pain. 25 tablet 4  . potassium chloride SA (K-DUR,KLOR-CON) 20 MEQ tablet Take 20 mEq by mouth daily.    . Tiotropium Bromide-Olodaterol (STIOLTO RESPIMAT) 2.5-2.5 MCG/ACT AERS Inhale 2 puffs into the lungs daily.     No current facility-administered medications for this visit.    PHYSICAL EXAMINATION: ECOG PERFORMANCE STATUS: 0 - Asymptomatic  Filed Vitals:   04/06/15 0911  BP: 152/72  Pulse: 71  Temp: 98.2 F (36.8 C)  Resp: 18   Filed Weights   04/06/15 0911  Weight: 180 lb 1.6 oz (81.693 kg)    GENERAL:alert, no distress and comfortable SKIN: skin color, texture, turgor are normal, no rashes or significant lesions EYES: normal, Conjunctiva are pink and non-injected, sclera clear OROPHARYNX:no exudate, no erythema and lips, buccal mucosa, and tongue normal  NECK: supple, thyroid normal size, non-tender, without nodularity LYMPH:  no palpable lymphadenopathy in the cervical, axillary or inguinal LUNGS: clear to auscultation and percussion with normal breathing effort HEART: regular rate & rhythm and no murmurs and no lower extremity edema ABDOMEN:abdomen soft, non-tender and normal bowel sounds Musculoskeletal:no cyanosis of digits and no clubbing. There is a small subcutaneous nodule beneath the right nipple, indeterminate NEURO: alert & oriented x 3 with fluent speech, no focal  motor/sensory deficits  LABORATORY DATA:  I have reviewed the data as listed    Component Value Date/Time   NA 143 03/06/2015 0905   NA 136 12/06/2014 0442   K 3.7 03/06/2015 0905   K 3.4* 12/06/2014 0442   CL 102 12/06/2014 0442   CL 110* 11/14/2012 0918   CO2 23 03/06/2015 0905   CO2 24 12/06/2014 0442   GLUCOSE 118 03/06/2015 0905   GLUCOSE 92 12/06/2014 0442   GLUCOSE 108* 11/14/2012 0918   BUN 11.3 03/06/2015 0905   BUN 14 12/06/2014 0442   CREATININE 1.0 03/06/2015 0905   CREATININE 1.16 12/06/2014 0442   CREATININE 1.16 06/19/2014 1610   CALCIUM 8.4 03/06/2015 0905   CALCIUM 7.6* 12/06/2014 0442   PROT 6.2 03/25/2015 1240   PROT 6.1* 03/06/2015 0905   ALBUMIN 3.3* 03/06/2015 0905   ALBUMIN 2.5* 11/29/2014 0400   AST 18 03/06/2015 0905   AST 22 11/29/2014 0400   ALT 17 03/06/2015 0905   ALT 16 11/29/2014 0400   ALKPHOS 84 03/06/2015 0905   ALKPHOS 65 11/29/2014 0400   BILITOT 0.48 03/06/2015 0905   BILITOT 0.8 11/29/2014 0400   GFRNONAA 62* 12/06/2014 0442   GFRNONAA 63 06/19/2014 1610   GFRAA 72* 12/06/2014 0442   GFRAA 73 06/19/2014 1610      No results found for: SPEP, UPEP  Lab Results  Component Value Date   WBC 4.8 04/06/2015   NEUTROABS 3.5 04/06/2015   HGB 11.0* 04/06/2015   HCT 32.7* 04/06/2015   MCV 96.9 04/06/2015   PLT 176 04/06/2015      Chemistry      Component Value Date/Time   NA 143 03/06/2015 0905   NA 136 12/06/2014 0442   K 3.7 03/06/2015 0905   K 3.4* 12/06/2014 0442   CL 102 12/06/2014 0442   CL 110* 11/14/2012 0918   CO2 23 03/06/2015 0905   CO2 24 12/06/2014 0442   BUN 11.3 03/06/2015 0905   BUN 14 12/06/2014 0442   CREATININE 1.0 03/06/2015 0905   CREATININE 1.16 12/06/2014 0442   CREATININE 1.16 06/19/2014 1610      Component Value Date/Time   CALCIUM 8.4 03/06/2015 0905   CALCIUM 7.6* 12/06/2014 0442   ALKPHOS 84 03/06/2015 0905   ALKPHOS 65 11/29/2014 0400   AST 18 03/06/2015 0905   AST 22 11/29/2014 0400    ALT 17 03/06/2015 0905   ALT 16 11/29/2014 0400   BILITOT 0.48 03/06/2015 0905   BILITOT 0.8 11/29/2014 0400     ASSESSMENT & PLAN:  Chronic myelogenous leukemia His symptoms of diarrhea had resolved. He will continue taking half a tablet of Bosutinib I gave him instruction to discontinue scheduled Lomotil for 1 week and see if his diarrhea returns. If not, he can go up to full pill Bosutinib within a week. I will see him back next month for further assessment   Diarrhea due to drug This has resolved. I recommend he discontinue scheduled Lomotil  Anemia due to antineoplastic chemotherapy This is likely due to recent treatment. The patient denies recent history of bleeding such as epistaxis, hematuria or hematochezia. He is asymptomatic from the anemia. I will observe for now.  He does not require transfusion now. I will continue the chemotherapy at current dose without dosage adjustment.  If the anemia gets progressive worse in the future, I might have to delay his treatment or adjust the chemotherapy dose.     Pleural effusion, right  Thankfully, his pleural effusion has not recurred.  I will monitor that carefully.    Chest skin lesion He complained of a lesion behind the right nipple. It was tender last week but then has resolved. I review his last CT scan from April and noted just some tissue behind the right nipple. I reassured the patient and we will observe only for now    No orders of the defined types were placed in this encounter.   All questions were answered. The patient knows to call the clinic with any problems, questions or concerns. No barriers to learning was detected. I spent 15 minutes counseling the patient face to face. The total time spent in the appointment was 25 minutes and more than 50% was on counseling and review of test results     GORSUCH, NI, MD 04/06/2015 9:43 AM    

## 2015-04-06 NOTE — Assessment & Plan Note (Signed)
Thankfully, his pleural effusion has not recurred.  I will monitor that carefully.

## 2015-04-06 NOTE — Assessment & Plan Note (Signed)
This has resolved. I recommend he discontinue scheduled Lomotil

## 2015-04-10 ENCOUNTER — Other Ambulatory Visit: Payer: Commercial Managed Care - HMO

## 2015-04-17 ENCOUNTER — Encounter: Payer: Self-pay | Admitting: Cardiology

## 2015-04-20 ENCOUNTER — Ambulatory Visit (INDEPENDENT_AMBULATORY_CARE_PROVIDER_SITE_OTHER)
Admission: RE | Admit: 2015-04-20 | Discharge: 2015-04-20 | Disposition: A | Discharge status: Home / Self Care | Payer: Commercial Managed Care - HMO | Source: Ambulatory Visit | Attending: Pulmonary Disease | Admitting: Pulmonary Disease

## 2015-04-20 ENCOUNTER — Encounter: Payer: Self-pay | Admitting: Pulmonary Disease

## 2015-04-20 ENCOUNTER — Ambulatory Visit: Payer: Commercial Managed Care - HMO | Admitting: Hematology and Oncology

## 2015-04-20 ENCOUNTER — Ambulatory Visit (INDEPENDENT_AMBULATORY_CARE_PROVIDER_SITE_OTHER): Payer: Commercial Managed Care - HMO | Admitting: Pulmonary Disease

## 2015-04-20 VITALS — BP 116/60 | HR 67 | Temp 98.5°F | Ht 69.0 in | Wt 176.2 lb

## 2015-04-20 DIAGNOSIS — J948 Other specified pleural conditions: Secondary | ICD-10-CM

## 2015-04-20 DIAGNOSIS — J90 Pleural effusion, not elsewhere classified: Secondary | ICD-10-CM

## 2015-04-20 NOTE — Progress Notes (Signed)
Chief Complaint  Patient presents with  . Follow-up    pt stated he is doing well, breathing is good. pt had an xray today.pt states cancer pill was causing diarrhea and loss of appetite. pt started taking half the the dose to see how it goes.     History of Present Illness: Anthony Wolfe is a 69 y.o. male former smoker with Lt pleural effusion.  He has Rt pleural effusion in 2015.  His breathing is much better since he had thoracentesis in August.  He denies chest pain, cough, fever, or swelling.  He has been restarted on bosutinib.  TESTS: 05/17/14 Echo >> EF 35 to 43%, grade 2 diastolic dysfx 27/61/47 Rt thoracentesis >> glucose 100, protein 3.7, LDH 108, WBC 517 (90% lymphs), cytology >> reactive mesothelial cells positive for calretinin, cytokeratin 5/6, WT-1 06/10/14 CT chest >> Rt effusion, ASD RLL, emphysema, 3 mm nodule Lt lung 07/15/14 Rt thoracentesis >> 1.5 liters, LDH 101, WBC 947 (72L, 24M), ADA 10.4, cytology >> reactive mesothelial cells 03/30/15 Lt thoracentesis >> 1 liter, glucose 96, LDH 59, protein 3.2, WBC 1201, (68%L), cytology negative  Past medical hx >> HTN, HLD, CAD, ischemic cardiomyopathy, BPH, Anxiety, Depression, CML  Past surgical hx, Medications, Allergies, Family hx, Social hx all reviewed.   Physical Exam: BP 116/60 mmHg  Pulse 67  Temp(Src) 98.5 F (36.9 C)  Ht 5\' 9"  (1.753 m)  Wt 176 lb 3.2 oz (79.924 kg)  BMI 26.01 kg/m2  SpO2 97%  General - No distress ENT - No sinus tenderness, no oral exudate, no LAN Cardiac - s1s2 regular, no murmur Chest - no wheeze/rales Back - No focal tenderness Abd - Soft, non-tender Ext - No edema Neuro - Normal strength Skin - No rashes Psych - normal mood, and behavior  Dg Chest 2 View  04/20/2015   CLINICAL DATA:  Pleural effusion.  Hypertension.  EXAM: CHEST  2 VIEW  COMPARISON:  03/30/2015.  FINDINGS: Mediastinum hilar structures normal. Cardiac pacer in stable position. Prior CABG. Cardiomegaly. No  prominent pulmonary venous congestion. Persistent bibasilar infiltrates and bilateral pleural effusions.  IMPRESSION: 1. Persistent bibasilar infiltrates and pleural effusions. No interim clearing. 2. Cardiac pacer stable position. Prior CABG. Stable cardiomegaly. No prominent pulmonary venous congestion.   Electronically Signed   By: Marcello Moores  Register   On: 04/20/2015 13:58    Assessment/Plan:  Lt pleural effusion likely related to side effect from chemotherapy. No significant recurrence since thoracentesis is in August 2016. Plan: - he can f/u with PCP and Oncology - he can return to pulmonary as needed   Chesley Mires, MD [place] [facility]/Critical Care/Sleep Pager:  [phone]

## 2015-04-20 NOTE — Patient Instructions (Signed)
Follow up with pulmonary as needed 

## 2015-04-21 ENCOUNTER — Telehealth: Payer: Self-pay | Admitting: *Deleted

## 2015-04-21 MED ORDER — ONDANSETRON HCL 8 MG PO TABS: 8.0000 mg | ORAL_TABLET | Freq: Three times a day (TID) | ORAL | Status: DC | PRN

## 2015-04-21 NOTE — Telephone Encounter (Signed)
Wife reports pt increased bosutinib back up to one pill daily last Monday but he has had more diarrhea and nausea so she has decreased back to 1/2 pill starting yesterday, Monday 9/12.  He is taking lomotil prn diarrhea and zofran for nausea and needs a refill on the zofran.  Wife says she is pushing fluids to make sure he isn't dehydrated.   He is a little better since decreasing to 1/2 pill.  Instructed wife to call back tomorrow if no improvement in symptoms, otherwise continue 1/2 pill Bosutinib until next lab appt on 9/21 per Dr. Alvy Bimler.  Zofran refill sent to [facility].   Wife verbalized understanding.

## 2015-04-26 LAB — FUNGUS CULTURE W SMEAR
Fungal Smear: NONE SEEN
Special Requests: NORMAL

## 2015-04-27 ENCOUNTER — Encounter: Payer: Self-pay | Admitting: Internal Medicine

## 2015-04-28 ENCOUNTER — Ambulatory Visit (INDEPENDENT_AMBULATORY_CARE_PROVIDER_SITE_OTHER): Payer: Commercial Managed Care - HMO | Admitting: Family Medicine

## 2015-04-28 DIAGNOSIS — Z23 Encounter for immunization: Secondary | ICD-10-CM | POA: Diagnosis not present

## 2015-04-29 ENCOUNTER — Other Ambulatory Visit (HOSPITAL_BASED_OUTPATIENT_CLINIC_OR_DEPARTMENT_OTHER): Payer: Commercial Managed Care - HMO

## 2015-04-29 DIAGNOSIS — C9210 Chronic myeloid leukemia, BCR/ABL-positive, not having achieved remission: Secondary | ICD-10-CM

## 2015-04-29 DIAGNOSIS — C9290 Myeloid leukemia, unspecified, not having achieved remission: Secondary | ICD-10-CM | POA: Diagnosis not present

## 2015-04-29 DIAGNOSIS — J90 Pleural effusion, not elsewhere classified: Secondary | ICD-10-CM

## 2015-04-29 LAB — CBC WITH DIFFERENTIAL/PLATELET
BASO%: 0.8 % (ref 0.0–2.0)
Basophils Absolute: 0 10*3/uL (ref 0.0–0.1)
EOS%: 2.2 % (ref 0.0–7.0)
Eosinophils Absolute: 0.1 10*3/uL (ref 0.0–0.5)
HCT: 31.8 % — ABNORMAL LOW (ref 38.4–49.9)
HGB: 10.6 g/dL — ABNORMAL LOW (ref 13.0–17.1)
LYMPH%: 11.2 % — ABNORMAL LOW (ref 14.0–49.0)
MCH: 31.7 pg (ref 27.2–33.4)
MCHC: 33.3 g/dL (ref 32.0–36.0)
MCV: 95.3 fL (ref 79.3–98.0)
MONO#: 0.5 10*3/uL (ref 0.1–0.9)
MONO%: 8.5 % (ref 0.0–14.0)
NEUT#: 4.6 10*3/uL (ref 1.5–6.5)
NEUT%: 77.3 % — AB (ref 39.0–75.0)
PLATELETS: 176 10*3/uL (ref 140–400)
RBC: 3.34 10*6/uL — AB (ref 4.20–5.82)
RDW: 13.8 % (ref 11.0–14.6)
WBC: 6 10*3/uL (ref 4.0–10.3)
lymph#: 0.7 10*3/uL — ABNORMAL LOW (ref 0.9–3.3)

## 2015-04-29 LAB — COMPREHENSIVE METABOLIC PANEL (CC13)
ALT: 10 U/L (ref 0–55)
ANION GAP: 7 meq/L (ref 3–11)
AST: 13 U/L (ref 5–34)
Albumin: 3.2 g/dL — ABNORMAL LOW (ref 3.5–5.0)
Alkaline Phosphatase: 101 U/L (ref 40–150)
BUN: 8.6 mg/dL (ref 7.0–26.0)
CHLORIDE: 111 meq/L — AB (ref 98–109)
CO2: 27 meq/L (ref 22–29)
CREATININE: 1 mg/dL (ref 0.7–1.3)
Calcium: 8.6 mg/dL (ref 8.4–10.4)
EGFR: 78 mL/min/{1.73_m2} — AB (ref >90)
Glucose: 105 mg/dl (ref 70–140)
Potassium: 3.3 mEq/L — ABNORMAL LOW (ref 3.5–5.1)
SODIUM: 145 meq/L (ref 136–145)
Total Bilirubin: 0.48 mg/dL (ref 0.20–1.20)
Total Protein: 6.1 g/dL — ABNORMAL LOW (ref 6.4–8.3)

## 2015-04-30 ENCOUNTER — Other Ambulatory Visit: Payer: Self-pay | Admitting: Hematology and Oncology

## 2015-04-30 ENCOUNTER — Telehealth: Payer: Self-pay | Admitting: *Deleted

## 2015-04-30 DIAGNOSIS — J90 Pleural effusion, not elsewhere classified: Secondary | ICD-10-CM

## 2015-04-30 LAB — HEPATITIS B SURFACE ANTIBODY,QUALITATIVE: Hep B S Ab: NEGATIVE

## 2015-04-30 LAB — HEPATITIS B SURFACE ANTIGEN: Hepatitis B Surface Ag: NEGATIVE

## 2015-04-30 NOTE — Telephone Encounter (Signed)
-----   Message from [name], MD sent at 04/30/2015  6:57 AM EDT ----- Regarding: CXR He is supposed to have repeat CXR yesterday but I suspect he forgot Please tell him to stop by [facility] department before his appt next week

## 2015-04-30 NOTE — Telephone Encounter (Signed)
LVM on cell phone informing pt to have CXR done at [facility] before next Appt on 9/28.  Please call back if any questions or concerns.

## 2015-05-04 ENCOUNTER — Telehealth: Payer: Self-pay | Admitting: *Deleted

## 2015-05-04 ENCOUNTER — Encounter: Payer: Self-pay | Admitting: Cardiology

## 2015-05-04 NOTE — Telephone Encounter (Signed)
Reminded pt about getting CXR done before his appt w/ Dr. Alvy Bimler this week.  He says he has been having more difficulty breathing the last few days.  He will go to [facility] to have it done tomorrow before his appt w/ Dr. Alvy Bimler on Wednesday.  Instructed him to go to ED for any respiratory distress.  He verbalized understanding.

## 2015-05-05 ENCOUNTER — Ambulatory Visit (HOSPITAL_COMMUNITY)
Admission: RE | Admit: 2015-05-05 | Discharge: 2015-05-05 | Disposition: A | Discharge status: Home / Self Care | Payer: Commercial Managed Care - HMO | Source: Ambulatory Visit | Attending: Hematology and Oncology | Admitting: Hematology and Oncology

## 2015-05-05 DIAGNOSIS — R918 Other nonspecific abnormal finding of lung field: Secondary | ICD-10-CM | POA: Insufficient documentation

## 2015-05-05 DIAGNOSIS — J90 Pleural effusion, not elsewhere classified: Secondary | ICD-10-CM | POA: Insufficient documentation

## 2015-05-05 DIAGNOSIS — R0602 Shortness of breath: Secondary | ICD-10-CM | POA: Diagnosis not present

## 2015-05-06 ENCOUNTER — Telehealth: Payer: Self-pay | Admitting: Hematology and Oncology

## 2015-05-06 ENCOUNTER — Ambulatory Visit (HOSPITAL_BASED_OUTPATIENT_CLINIC_OR_DEPARTMENT_OTHER): Payer: Commercial Managed Care - HMO | Admitting: Hematology and Oncology

## 2015-05-06 ENCOUNTER — Encounter: Payer: Self-pay | Admitting: Hematology and Oncology

## 2015-05-06 VITALS — BP 131/54 | HR 70 | Temp 98.1°F | Resp 18 | Ht 69.0 in | Wt 179.8 lb

## 2015-05-06 DIAGNOSIS — K521 Toxic gastroenteritis and colitis: Secondary | ICD-10-CM | POA: Diagnosis not present

## 2015-05-06 DIAGNOSIS — C9210 Chronic myeloid leukemia, BCR/ABL-positive, not having achieved remission: Secondary | ICD-10-CM

## 2015-05-06 DIAGNOSIS — J948 Other specified pleural conditions: Secondary | ICD-10-CM

## 2015-05-06 DIAGNOSIS — J90 Pleural effusion, not elsewhere classified: Secondary | ICD-10-CM

## 2015-05-06 DIAGNOSIS — D6481 Anemia due to antineoplastic chemotherapy: Secondary | ICD-10-CM

## 2015-05-06 DIAGNOSIS — C9290 Myeloid leukemia, unspecified, not having achieved remission: Secondary | ICD-10-CM

## 2015-05-06 DIAGNOSIS — T451X5A Adverse effect of antineoplastic and immunosuppressive drugs, initial encounter: Secondary | ICD-10-CM

## 2015-05-06 NOTE — Assessment & Plan Note (Signed)
He has severe diarrhea with recent treatment. I recommend he takes Lomotil 3 times a day to control diarrhea. We will continue reduced dose chemotherapy

## 2015-05-06 NOTE — Assessment & Plan Note (Signed)
This is likely due to recent treatment. The patient denies recent history of bleeding such as epistaxis, hematuria or hematochezia. He is asymptomatic from the anemia. I will observe for now.  He does not require transfusion now. I will continue the chemotherapy at current dose without dosage adjustment.  If the anemia gets progressive worse in the future, I might have to delay his treatment or adjust the chemotherapy dose.  

## 2015-05-06 NOTE — Progress Notes (Signed)
[place] OFFICE PROGRESS NOTE  Patient Care Team: [name], MD as PCP - General (Family Medicine) [name], MD as Attending Physician (Cardiology) [name], MD (Family Medicine) [name], MD (Cardiothoracic Surgery) [name], MD as Attending Physician (Vascular Surgery) [name], MD as Consulting Physician (Hematology and Oncology) Deboraha Sprang, MD as Consulting Physician (Cardiology)  SUMMARY OF ONCOLOGIC HISTORY:   Chronic myelogenous leukemia   05/04/2012 Bone Marrow Biopsy BM confirmed diagnosis of CML in Chronic phase. BCR/ABL by PCR detected abnormalitis with b2a2 & b3a2 subtypes   05/09/2012 - 03/16/2015 Chemotherapy He was started on treatment with Dasatinib 100 mg daily   06/06/2012 Adverse Reaction Dose of medication was reduced to 50 mg daily due to pancytopenia   01/24/2013 Progression Patient was noted to have elevated blood count which and was subsequently found to be noncompliant to treatment. The patient has not been on treatment for several months because his prescription ran out. He was restarted back on treatment   01/16/2014 Progression Bcr/ABL by PCR is worse. Dose of Dasatinib was increased to 100 mg.   02/26/2014 Tumor Marker Blood work for ABL kinase mutation was negative.   10/29/2014 Tumor Marker BCR/ABL b2a2 & b3a2 0.29%, IS 0.1624%, not in MMR yet but improving   11/05/2014 Adverse Reaction He had thoracentesis due to pleural effusion   11/27/2014 Surgery He underwent right video-assisted thoracoscopy, Drainage of pleural effusion, Pleural biopsy, Diaphragm biopsy, Lung biopsy & Talc pleurodesis   01/30/2015 Tumor Marker BCR/ABL b2a2 & b3a2 0.78%, IS 0.4368%, not in MMR    03/11/2015 Pathology Results BCR/ABL b2a2 0.19%, IS 0.1064%, not in MMR    03/16/2015 Adverse Reaction Dasatinib was stopped due to recurrent pleural effusion   03/28/2015 -  Chemotherapy He started on Bosutinib   03/30/2015 Procedure He has  therapeutic thoracentesis of the right lung with 1 liter of fluid removed   03/30/2015 Adverse Reaction Bosutinib is placed on hold, to be restart on 8/25 at 250 mg due to severe diarrhea   04/28/2015 Tumor Marker BCR/ABL b2a2 0.06%, IS 0.0336%, in MMR     INTERVAL HISTORY: Please see below for problem oriented charting. He returns for further follow-up. He complain of intermittent shortness of breath over the last few weeks, relieved with occasional Lasix dose. He is to have profuse diarrhea, controlled with Lomotil. When the dose of chemotherapy was reduced to 250 mg, he tolerated that better.  REVIEW OF SYSTEMS:   Constitutional: Denies fevers, chills or abnormal weight loss Eyes: Denies blurriness of vision Ears, nose, mouth, throat, and face: Denies mucositis or sore throat Cardiovascular: Denies palpitation, chest discomfort or lower extremity swelling Skin: Denies abnormal skin rashes Lymphatics: Denies new lymphadenopathy or easy bruising Neurological:Denies numbness, tingling or new weaknesses Behavioral/Psych: Mood is stable, no new changes  All other systems were reviewed with the patient and are negative.  I have reviewed the past medical history, past surgical history, social history and family history with the patient and they are unchanged from previous note.  ALLERGIES:  is allergic to norvasc and sulfa antibiotics.  MEDICATIONS:  Current Outpatient Prescriptions  Medication Sig Dispense Refill  . aspirin 81 MG chewable tablet Chew 1 tablet (81 mg total) by mouth daily.    Marland Kitchen atorvastatin (LIPITOR) 80 MG tablet TAKE 1 TABLET EVERY DAY  AT  6:00  PM 90 tablet 1  . bosutinib (BOSULIF) 500 MG tablet Take 1 tablet (500 mg total) by mouth  daily with breakfast. Take with food. 30 tablet 6  . carvedilol (COREG) 6.25 MG tablet Take 1 tablet (6.25 mg total) by mouth 2 (two) times daily. 180 tablet 1  . clopidogrel (PLAVIX) 75 MG tablet Take 1 tablet (75 mg total) by mouth daily.  30 tablet 11  . diphenoxylate-atropine (LOMOTIL) 2.5-0.025 MG per tablet Take 1 tablet by mouth 4 (four) times daily. 90 tablet 3  . furosemide (LASIX) 20 MG tablet Take 1 tablet (20 mg total) by mouth daily. 30 tablet 3  . guaiFENesin (MUCINEX) 600 MG 12 hr tablet Take 1200 mg by mouth 2-3 times a day as needed for cough or to loosen phlegm    . losartan (COZAAR) 25 MG tablet Take 1 tablet (25 mg total) by mouth daily. Stop lisinopril. 30 tablet 5  . nitroGLYCERIN (NITROSTAT) 0.4 MG SL tablet Place 1 tablet (0.4 mg total) under the tongue every 5 (five) minutes as needed for chest pain. 25 tablet 4  . ondansetron (ZOFRAN) 8 MG tablet Take 1 tablet (8 mg total) by mouth every 8 (eight) hours as needed for nausea or vomiting. 60 tablet 5  . potassium chloride SA (K-DUR,KLOR-CON) 20 MEQ tablet Take 20 mEq by mouth daily.     No current facility-administered medications for this visit.    PHYSICAL EXAMINATION: ECOG PERFORMANCE STATUS: 1 - Symptomatic but completely ambulatory  Filed Vitals:   05/06/15 0850  BP: 131/54  Pulse: 70  Temp: 98.1 F (36.7 C)  Resp: 18   Filed Weights   05/06/15 0850  Weight: 179 lb 12.8 oz (81.557 kg)    GENERAL:alert, no distress and comfortable SKIN: skin color, texture, turgor are normal, no rashes or significant lesions EYES: normal, Conjunctiva are pink and non-injected, sclera clear OROPHARYNX:no exudate, no erythema and lips, buccal mucosa, and tongue normal  NECK: supple, thyroid normal size, non-tender, without nodularity LYMPH:  no palpable lymphadenopathy in the cervical, axillary or inguinal LUNGS: clear to auscultation and percussion with normal breathing effort HEART: regular rate & rhythm and no murmurs and no lower extremity edema ABDOMEN:abdomen soft, non-tender and normal bowel sounds Musculoskeletal:no cyanosis of digits and no clubbing  NEURO: alert & oriented x 3 with fluent speech, no focal motor/sensory deficits  LABORATORY DATA:   I have reviewed the data as listed    Component Value Date/Time   NA 145 04/29/2015 0858   NA 136 12/06/2014 0442   K 3.3* 04/29/2015 0858   K 3.4* 12/06/2014 0442   CL 102 12/06/2014 0442   CL 110* 11/14/2012 0918   CO2 27 04/29/2015 0858   CO2 24 12/06/2014 0442   GLUCOSE 105 04/29/2015 0858   GLUCOSE 92 12/06/2014 0442   GLUCOSE 108* 11/14/2012 0918   BUN 8.6 04/29/2015 0858   BUN 14 12/06/2014 0442   CREATININE 1.0 04/29/2015 0858   CREATININE 1.16 12/06/2014 0442   CREATININE 1.16 06/19/2014 1610   CALCIUM 8.6 04/29/2015 0858   CALCIUM 7.6* 12/06/2014 0442   PROT 6.1* 04/29/2015 0858   PROT 6.2 03/25/2015 1240   ALBUMIN 3.2* 04/29/2015 0858   ALBUMIN 2.5* 11/29/2014 0400   AST 13 04/29/2015 0858   AST 22 11/29/2014 0400   ALT 10 04/29/2015 0858   ALT 16 11/29/2014 0400   ALKPHOS 101 04/29/2015 0858   ALKPHOS 65 11/29/2014 0400   BILITOT 0.48 04/29/2015 0858   BILITOT 0.8 11/29/2014 0400   GFRNONAA 62* 12/06/2014 0442   GFRNONAA 63 06/19/2014 1610   GFRAA 72* 12/06/2014  [employer] 06/19/2014 1610    No results found for: SPEP, UPEP  Lab Results  Component Value Date   WBC 6.0 04/29/2015   NEUTROABS 4.6 04/29/2015   HGB 10.6* 04/29/2015   HCT 31.8* 04/29/2015   MCV 95.3 04/29/2015   PLT 176 04/29/2015      Chemistry      Component Value Date/Time   NA 145 04/29/2015 0858   NA 136 12/06/2014 0442   K 3.3* 04/29/2015 0858   K 3.4* 12/06/2014 0442   CL 102 12/06/2014 0442   CL 110* 11/14/2012 0918   CO2 27 04/29/2015 0858   CO2 24 12/06/2014 0442   BUN 8.6 04/29/2015 0858   BUN 14 12/06/2014 0442   CREATININE 1.0 04/29/2015 0858   CREATININE 1.16 12/06/2014 0442   CREATININE 1.16 06/19/2014 1610      Component Value Date/Time   CALCIUM 8.6 04/29/2015 0858   CALCIUM 7.6* 12/06/2014 0442   ALKPHOS 101 04/29/2015 0858   ALKPHOS 65 11/29/2014 0400   AST 13 04/29/2015 0858   AST 22 11/29/2014 0400   ALT 10 04/29/2015 0858   ALT 16  11/29/2014 0400   BILITOT 0.48 04/29/2015 0858   BILITOT 0.8 11/29/2014 0400       RADIOGRAPHIC STUDIES: I reviewed the x-ray with the patient I have personally reviewed the radiological images as listed and agreed with the findings in the report. Dg Chest 2 View  05/05/2015   CLINICAL DATA:  69 year old male with CML, recurrent pleural effusion, shortness of breath.  EXAM: CHEST  2 VIEW  COMPARISON:  04/20/2015 and earlier.  FINDINGS: Stable left chest cardiac AICD. Stable cardiac size and mediastinal contours. Sequelae of CABG. Visualized tracheal air column is within normal limits. Surgical clips in the right Neck which might be related to the carotid on that side.  Increased confluence of opacity at the right lung base, might be mostly related to pleural fluid tracking in the fissure simulating a 6 cm area of masslike opacity. Small left pleural effusion is stable. No pneumothorax or pulmonary edema. No air bronchograms. No acute osseous abnormality identified. Calcified aortic atherosclerosis.  IMPRESSION: Increased confluent opacity at the right lung base, favor due increased to pleural fluid tracking in the fissures. Chest CT (IV contrast preferred) would confirm.  Otherwise stable small left pleural effusion and streaky bibasilar opacity.   Electronically Signed   By: Genevie Ann M.D.   On: 05/05/2015 09:23     ASSESSMENT & PLAN:  Chronic myelogenous leukemia He tolerated treatment very poorly. Thankfully, recent blood work suggested that he has achieved a major molecular response. Recommend he continues reduced dose Bosutinib at 250 mg daily. I will see him back in 6 weeks with repeat blood work & exam  Diarrhea due to drug He has severe diarrhea with recent treatment. I recommend he takes Lomotil 3 times a day to control diarrhea. We will continue reduced dose chemotherapy  Pleural effusion, right Chest x-ray show mild persistent pleural effusion. Clinically, he does have not  symptoms of significant dyspnea. Oxygen saturation is within normal limits. There is no indication for further management. I will continue to monitor with periodic imaging study.  Anemia due to antineoplastic chemotherapy This is likely due to recent treatment. The patient denies recent history of bleeding such as epistaxis, hematuria or hematochezia. He is asymptomatic from the anemia. I will observe for now.  He does not require transfusion now. I will continue the chemotherapy at  current dose without dosage adjustment.  If the anemia gets progressive worse in the future, I might have to delay his treatment or adjust the chemotherapy dose.      Orders Placed This Encounter  Procedures  . DG Chest 2 View    Standing Status: Future     Number of Occurrences:      Standing Expiration Date: 06/09/2016    Order Specific Question:  Reason for exam:    Answer:  recurrent pleural effusion    Order Specific Question:  Preferred imaging location?    Answer:  [facility]  . CBC with Differential/Platelet    Standing Status: Future     Number of Occurrences:      Standing Expiration Date: 06/09/2016  . Comprehensive metabolic panel    Standing Status: Future     Number of Occurrences:      Standing Expiration Date: 06/09/2016  . BCR-ABL    With RT-PCR technique    Standing Status: Future     Number of Occurrences:      Standing Expiration Date: 06/09/2016   All questions were answered. The patient knows to call the clinic with any problems, questions or concerns. No barriers to learning was detected. I spent 20 minutes counseling the patient face to face. The total time spent in the appointment was 25 minutes and more than 50% was on counseling and review of test results     [facility], [place], MD 05/06/2015 3:43 PM

## 2015-05-06 NOTE — Telephone Encounter (Signed)
per pof to sch pt appt-gave pt copy of avs °

## 2015-05-06 NOTE — Assessment & Plan Note (Signed)
He tolerated treatment very poorly. Thankfully, recent blood work suggested that he has achieved a major molecular response. Recommend he continues reduced dose Bosutinib at 250 mg daily. I will see him back in 6 weeks with repeat blood work & exam

## 2015-05-06 NOTE — Assessment & Plan Note (Signed)
Chest x-ray show mild persistent pleural effusion. Clinically, he does have not symptoms of significant dyspnea. Oxygen saturation is within normal limits. There is no indication for further management. I will continue to monitor with periodic imaging study.

## 2015-05-12 LAB — AFB CULTURE WITH SMEAR (NOT AT ARMC)
ACID FAST SMEAR: NONE SEEN
SPECIAL REQUESTS: NORMAL

## 2015-05-22 ENCOUNTER — Other Ambulatory Visit: Payer: Commercial Managed Care - HMO

## 2015-05-27 ENCOUNTER — Other Ambulatory Visit: Payer: Self-pay | Admitting: Physician Assistant

## 2015-05-27 ENCOUNTER — Other Ambulatory Visit: Payer: Self-pay | Admitting: Family Medicine

## 2015-05-29 ENCOUNTER — Ambulatory Visit (INDEPENDENT_AMBULATORY_CARE_PROVIDER_SITE_OTHER): Payer: Commercial Managed Care - HMO | Admitting: Family Medicine

## 2015-05-29 ENCOUNTER — Encounter: Payer: Self-pay | Admitting: Family Medicine

## 2015-05-29 ENCOUNTER — Ambulatory Visit
Admission: RE | Admit: 2015-05-29 | Discharge: 2015-05-29 | Disposition: A | Discharge status: Home / Self Care | Payer: Commercial Managed Care - HMO | Source: Ambulatory Visit | Attending: Family Medicine | Admitting: Family Medicine

## 2015-05-29 VITALS — BP 150/80 | HR 68 | Temp 97.9°F | Resp 18 | Wt 174.0 lb

## 2015-05-29 DIAGNOSIS — R0600 Dyspnea, unspecified: Secondary | ICD-10-CM

## 2015-05-29 DIAGNOSIS — J90 Pleural effusion, not elsewhere classified: Secondary | ICD-10-CM | POA: Diagnosis not present

## 2015-05-29 LAB — CBC W/MCH & 3 PART DIFF
HEMATOCRIT: 32.9 % — AB (ref 39.0–52.0)
Hemoglobin: 10.5 g/dL — ABNORMAL LOW (ref 13.0–17.0)
Lymphocytes Relative: 15 % (ref 12–46)
Lymphs Abs: 0.7 10*3/uL (ref 0.7–4.0)
MCH: 30.9 pg (ref 26.0–34.0)
MCHC: 31.9 g/dL (ref 30.0–36.0)
MCV: 96.8 fL (ref 78.0–100.0)
NEUTROS ABS: 3.3 10*3/uL (ref 1.7–7.7)
NEUTROS PCT: 71 % (ref 43–77)
Platelets: 187 10*3/uL (ref 150–400)
RBC: 3.4 MIL/uL — ABNORMAL LOW (ref 4.22–5.81)
RDW: 14.9 % (ref 11.5–15.5)
WBC mixed population %: 14 % (ref 3–18)
WBC mixed population: 0.7 10*3/uL (ref 0.1–1.8)
WBC: 4.7 10*3/uL (ref 4.0–10.5)

## 2015-05-29 NOTE — Progress Notes (Signed)
Subjective:    Patient ID: Anthony Wolfe, male    DOB: 1946-04-20, 69 y.o.   MRN: [id number]  HPI 02/26/15 Patient underwent VATS procedure in April for recurrent right pleural effusion. Patient states that ever since that time, he has a persistent daily cough productive of white mucus, waxing and waning shortness of breath, and chest heaviness. He has a significant past medical history including COPD. Patient quit smoking in 1999 there was smoke 3 packs of cigarettes a day for decades prior to that. Also the patient has recently had NSTEMI in October 2015. He is been on an ACE inhibitor and a beta blocker ever since. He denies any hemoptysis. He denies any weight loss. 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  He has been tested on 2 separate occasions for C. difficile colitis both tested been negative.  He has been taking Mucinex 1200 mg a day but this is no longer beneficial. Sunday patient states he felt like he could not catch his breath. He denies any chest pain. He denies any angina.  At that time, my plan was: I am concerned that the patient's symptoms sound like emphysema.  I will screen the patient with pulmonary function test. Also believe that the ACE inhibitor he is taking may be contributing to his cough. Therefore I will discontinue the ACE inhibitor and replace it with losartan 25 mg a day.  Permanent function tests reveal an FEV1 percentage of 74% with an FEV1 of 1.49 L which is 48% of predicted consistent with stage III COPD.  Begin stiolto 2 inhalations daily. Recheck in 2 weeks. Also use albuterol 2 puffs inhaled every 6 hours as needed. Recheck sooner if worse. I did give the patient some Lomotil for diarrhea  03/17/15  patient did very well for 2 weeks on stiolto  And off his ACE inhibitor. However over the last week he is started developing increasing dyspnea on exertion and continues to have a persistent daily cough  productive of white mucus.   He spoke to his oncologist this morning and they believe it could be related to his Sprycell.   After reviewing the literature on this medicine that does report up to 25%  The patient's report dyspnea on this medication and have pleural effusions. He certainly has both. However on examination today he has new left basilar crackles that were not there previously. Also he had been doing better up until last week which makes me think this is a new event exacerbating his underlying issues.  AT that time, my plan was:  while it is possible his medication is causing this I still believe he may have an element of emphysema and I am concerned he may be possibly developing left lower lobe pneumonia or possibly a new left sided pleural effusion. I will obtain a chest x-ray today to evaluate further. If there is evidence of pneumonia, I'll start the patient on antibiotics. If there is a pleural effusion I will start with diabetics. If there is a pleural effusion, he may need to consider stopping Sprycell  Because then the patient would've developed pleural effusions on numerous  Occasions for no reason other than that medication.  05/29/15 Patient ultimately was found to have recurrent pleural effusions secondary to his cancer medication. This was discontinued for 2 weeks by his oncologist, and his shortness of breath dramatically improved. Subsequently he has been started on bosulif.  Patient states that  his dyspnea has returned. He now has dyspnea with minimal exertion. He also reports by basilar pleurisy with deep inspiration. He denies any fevers or chills. He denies any chest congestion. On examination today, his lungs are clear to auscultation bilaterally with no wheezes crackles Rales. He is actually moving a good amount of air compared to his baseline. I do not appreciate any pulmonary abnormality. A stat CBC was obtained my office which shows a white blood cell count 4.7, hemoglobin of  10.5, hematocrit of 32.9. Therefore I do not believe that severe anemia as the cause of his dyspnea Past Medical History  Diagnosis Date  . Hyperlipidemia   . HTN (hypertension)   . Allergic rhinitis   . Prostate hypertrophy   . Anxiety   . CAD (coronary artery disease)     a. s/p 3v CABG 2010. b. NSTEMI 05/2014 in setting of VT. Cath impressions: "Recent IMI with occluded SVG to RCA. Has Right to Right collaterals and collaterals from septal and OM. EF 40%."  . TIA (transient ischemic attack)     ASPVD, S./P. right CEA, 2002, and known occluded left carotid  . CML (chronic myelocytic leukemia) ([place]) 05/09/2012  . Ischemic cardiomyopathy     a. Prior EF 36%. b. 2014: 50%. c. 05/2014: EF 35-40% by echo, 40% with inf HK by cath.  . Carotid disease, bilateral ([place])     a. Right CEA 2002; known occluded left carotid. b. Duplex 06/2014: known occluded LICA, stable 7-25% RICA s/p CEA.  . Depression   . PVC's (premature ventricular contractions)     a. PVC's with prior Holter showing PVC load of 22%.  . 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 Pneumonia 05/23/2014  . Ventricular tachycardia ([place])     a. Admitted with VT 05/2013 - EPS with inducible sustained monomorphic VT; s/p [employer] ICD 05/21/14.  . Emphysema of lung ([place])   . NSTEMI (non-ST elevation myocardial infarction) ([place]) 05/2014  . Pleural effusion 05/2014  . Chronic systolic CHF (congestive heart failure) ([place])   . C. difficile colitis 06/16/2014  . AICD (automatic cardioverter/defibrillator) present   . Presence of permanent cardiac pacemaker   . Stroke ([place]) 01  . Shortness of breath dyspnea   . Blood dyscrasia     cmleukemia   Past Surgical History  Procedure Laterality Date  . Coronary artery bypass graft  02/2009    3 vessel  . Colonoscopy  07/2010  . Carotidectomy  2003    right side  . Back surgery    . Electrophysiologic study  05/21/14  . Ep implantable device  05/21/14    single chamber Metronic ICD  .  Left heart catheterization with coronary/graft angiogram N/A 05/19/2014    Procedure: LEFT HEART CATHETERIZATION WITH Beatrix Fetters;  Surgeon: Josue Hector, MD;  Location[facility] C[facility] LAB;  Service: Cardiovascular;  Laterality: N/A;  . Electrophysiology study N/A 05/21/2014    Procedure: ELECTROPHYSIOLOGY STUDY;  Surgeon: Deboraha Sprang, MD;  Location: [facility] CATH LAB;  Service: Cardiovascular;  Laterality: N/A;  . Carotid endarterectomy Right 01  . Video assisted thoracoscopy Right 11/27/2014    Procedure: RIGHT VIDEO ASSISTED THORACOSCOPY;  Surgeon: [name], MD;  Location: [place];  Service: Thoracic;  Laterality: Right;  . Pleural effusion drainage Right 11/27/2014    Procedure: DRAINAGE OF PLEURAL EFFUSION;  Surgeon: [name], MD;  Location: [place];  Service: Thoracic;  Laterality: Right;  . Pleural biopsy N/A 11/27/2014    Procedure:  PLEURAL BIOPSY;  Surgeon: [name], MD;  Location: [place];  Service: Thoracic;  Laterality: N/A;  . Talc pleurodesis Right 11/27/2014    Procedure: Pietro Cassis;  Surgeon: [name], MD;  Location: [place];  Service: Thoracic;  Laterality: Right;   Current Outpatient Prescriptions on File Prior to Visit  Medication Sig Dispense Refill  . aspirin 81 MG chewable tablet Chew 1 tablet (81 mg total) by mouth daily.    Marland Kitchen atorvastatin (LIPITOR) 80 MG tablet TAKE 1 TABLET EVERY DAY  AT  6:00  PM 90 tablet 1  . bosutinib (BOSULIF) 500 MG tablet Take 1 tablet (500 mg total) by mouth daily with breakfast. Take with food. 30 tablet 6  . carvedilol (COREG) 6.25 MG tablet Take 1 tablet (6.25 mg total) by mouth 2 (two) times daily. 180 tablet 1  . clopidogrel (PLAVIX) 75 MG tablet Take 1 tablet (75 mg total) by mouth daily. 30 tablet 11  . diphenoxylate-atropine (LOMOTIL) 2.5-0.025 MG per tablet Take 1 tablet by mouth 4 (four) times daily. 90 tablet 3  . furosemide (LASIX) 20 MG tablet TAKE ONE TABLET BY MOUTH DAILY 30 tablet 11   . guaiFENesin (MUCINEX) 600 MG 12 hr tablet Take 1200 mg by mouth 2-3 times a day as needed for cough or to loosen phlegm    . KLOR-CON M20 20 MEQ tablet TAKE ONE TABLET BY MOUTH ONCE DAILY 30 tablet 11  . losartan (COZAAR) 25 MG tablet Take 1 tablet (25 mg total) by mouth daily. Stop lisinopril. 30 tablet 5  . nitroGLYCERIN (NITROSTAT) 0.4 MG SL tablet Place 1 tablet (0.4 mg total) under the tongue every 5 (five) minutes as needed for chest pain. 25 tablet 4  . ondansetron (ZOFRAN) 8 MG tablet Take 1 tablet (8 mg total) by mouth every 8 (eight) hours as needed for nausea or vomiting. 60 tablet 5  . potassium chloride SA (K-DUR,KLOR-CON) 20 MEQ tablet Take 20 mEq by mouth daily.     No current facility-administered medications on file prior to visit.   Allergies  Allergen Reactions  . Norvasc [Amlodipine Besylate] Rash    rash  . Sulfa Antibiotics Hives    Hives    Social History   Social History  . Marital Status: Married    Spouse Name: N/A  . Number of Children: 2  . Years of Education: N/A   Occupational History  .      retired Development worker, [employer]   Social History Main Topics  . Smoking status: Former Smoker -- 3.00 packs/day for 45 years    Types: Cigarettes    Quit date: 08/08/1997  . Smokeless tobacco: Former Systems developer  . Alcohol Use: No  . Drug Use: No  . Sexual Activity: Not on file   Other Topics Concern  . Not on file   Social History Narrative      Review of Systems  All other systems reviewed and are negative.      Objective:   Physical Exam  Constitutional: He appears well-developed and well-nourished. No distress.  HENT:  Right Ear: External ear normal.  Left Ear: External ear normal.  Nose: Nose normal.  Mouth/Throat: Oropharynx is clear and moist. No oropharyngeal exudate.  Eyes: Conjunctivae are normal.  Neck: Neck supple.  Cardiovascular: Normal rate, regular rhythm and normal heart sounds.   No murmur heard. Pulmonary/Chest: Effort normal. No  respiratory distress. He has no wheezes. He has rales. He exhibits no tenderness.  Abdominal: Soft. Bowel sounds  are normal. He exhibits no distension. There is no tenderness. There is no rebound and no guarding.  Lymphadenopathy:    He has no cervical adenopathy.  Skin: He is not diaphoretic.  Vitals reviewed.         Assessment & Plan:  Dyspnea - Plan: CBC w/MCH & 3 Part Diff, DG Chest 2 View  History and exam does not provide a definitive cause of the shortness of breath. Therefore I would like the patient get a chest x-ray immediately. CBC without anemia. His shortness of breath does not appear to be angina. Congestive heart failure be another possibility. I will await the results of the chest x-ray

## 2015-06-01 ENCOUNTER — Other Ambulatory Visit: Payer: Self-pay | Admitting: Family Medicine

## 2015-06-01 DIAGNOSIS — R9389 Abnormal findings on diagnostic imaging of other specified body structures: Secondary | ICD-10-CM

## 2015-06-02 ENCOUNTER — Ambulatory Visit: Payer: Commercial Managed Care - HMO | Admitting: Hematology and Oncology

## 2015-06-04 ENCOUNTER — Telehealth: Payer: Self-pay | Admitting: *Deleted

## 2015-06-04 NOTE — Telephone Encounter (Signed)
Submitted human[employer] referral thru acuity connect for authorization on 06/02/15 to [place] for CT with authorization number [id number]  Requesting provider: Flonnie Hailstone  Treating provider: [facility]   Number of visits:1  Start Date: 06/01/15  End Date: 11/28/15  Dx: R93.8- abnormal findings on diagnostic imaging of body structures  PCP: Cletus Gash Pickard,MD  Type of service: CT  Procedure: 71260-CT thorax w/dye

## 2015-06-08 ENCOUNTER — Ambulatory Visit
Admission: RE | Admit: 2015-06-08 | Discharge: 2015-06-08 | Disposition: A | Discharge status: Home / Self Care | Payer: Commercial Managed Care - HMO | Source: Ambulatory Visit | Attending: Family Medicine | Admitting: Family Medicine

## 2015-06-08 DIAGNOSIS — J90 Pleural effusion, not elsewhere classified: Secondary | ICD-10-CM | POA: Diagnosis not present

## 2015-06-08 DIAGNOSIS — R918 Other nonspecific abnormal finding of lung field: Secondary | ICD-10-CM | POA: Diagnosis not present

## 2015-06-08 DIAGNOSIS — R9389 Abnormal findings on diagnostic imaging of other specified body structures: Secondary | ICD-10-CM

## 2015-06-08 MED ORDER — IOPAMIDOL (ISOVUE-300) INJECTION 61%
75.0000 mL | Freq: Once | INTRAVENOUS | Status: AC | PRN
Start: 2015-06-08 — End: 2015-06-08
  Administered 2015-06-08: 75 mL via INTRAVENOUS

## 2015-06-09 ENCOUNTER — Encounter: Payer: Self-pay | Admitting: *Deleted

## 2015-06-10 ENCOUNTER — Other Ambulatory Visit (HOSPITAL_BASED_OUTPATIENT_CLINIC_OR_DEPARTMENT_OTHER): Payer: Commercial Managed Care - HMO

## 2015-06-10 DIAGNOSIS — C9210 Chronic myeloid leukemia, BCR/ABL-positive, not having achieved remission: Secondary | ICD-10-CM

## 2015-06-10 LAB — COMPREHENSIVE METABOLIC PANEL (CC13)
ALK PHOS: 92 U/L (ref 40–150)
ALT: 25 U/L (ref 0–55)
AST: 25 U/L (ref 5–34)
Albumin: 3.3 g/dL — ABNORMAL LOW (ref 3.5–5.0)
Anion Gap: 6 mEq/L (ref 3–11)
BILIRUBIN TOTAL: 0.55 mg/dL (ref 0.20–1.20)
BUN: 13.3 mg/dL (ref 7.0–26.0)
CO2: 26 meq/L (ref 22–29)
Calcium: 8.9 mg/dL (ref 8.4–10.4)
Chloride: 112 mEq/L — ABNORMAL HIGH (ref 98–109)
Creatinine: 0.9 mg/dL (ref 0.7–1.3)
EGFR: 86 mL/min/{1.73_m2} — AB (ref >90)
GLUCOSE: 124 mg/dL (ref 70–140)
Potassium: 3.9 mEq/L (ref 3.5–5.1)
SODIUM: 143 meq/L (ref 136–145)
TOTAL PROTEIN: 6.3 g/dL — AB (ref 6.4–8.3)

## 2015-06-10 LAB — CBC WITH DIFFERENTIAL/PLATELET
BASO%: 1 % (ref 0.0–2.0)
Basophils Absolute: 0.1 10*3/uL (ref 0.0–0.1)
EOS ABS: 0.2 10*3/uL (ref 0.0–0.5)
EOS%: 3.3 % (ref 0.0–7.0)
HCT: 35.5 % — ABNORMAL LOW (ref 38.4–49.9)
HGB: 11.3 g/dL — ABNORMAL LOW (ref 13.0–17.1)
LYMPH%: 14.7 % (ref 14.0–49.0)
MCH: 30.6 pg (ref 27.2–33.4)
MCHC: 31.8 g/dL — ABNORMAL LOW (ref 32.0–36.0)
MCV: 96.2 fL (ref 79.3–98.0)
MONO#: 0.4 10*3/uL (ref 0.1–0.9)
MONO%: 7.9 % (ref 0.0–14.0)
NEUT%: 73.1 % (ref 39.0–75.0)
NEUTROS ABS: 3.8 10*3/uL (ref 1.5–6.5)
Platelets: 176 10*3/uL (ref 140–400)
RBC: 3.69 10*6/uL — AB (ref 4.20–5.82)
RDW: 14.2 % (ref 11.0–14.6)
WBC: 5.2 10*3/uL (ref 4.0–10.3)
lymph#: 0.8 10*3/uL — ABNORMAL LOW (ref 0.9–3.3)

## 2015-06-11 ENCOUNTER — Encounter: Payer: Self-pay | Admitting: *Deleted

## 2015-06-16 ENCOUNTER — Telehealth: Payer: Self-pay | Admitting: Hematology and Oncology

## 2015-06-16 ENCOUNTER — Telehealth: Payer: Self-pay | Admitting: *Deleted

## 2015-06-16 NOTE — Telephone Encounter (Signed)
per 11/8 pof move from f/u from 11/9 to 11/17. Date/time/patient aware per pof.

## 2015-06-16 NOTE — Telephone Encounter (Signed)
Pt notified that appt with Dr Alvy Bimler has been delayed until 11/17 @ 0845 due to delay in BCR/ABL test.

## 2015-06-17 ENCOUNTER — Ambulatory Visit: Payer: Commercial Managed Care - HMO | Admitting: Hematology and Oncology

## 2015-06-25 ENCOUNTER — Ambulatory Visit (HOSPITAL_BASED_OUTPATIENT_CLINIC_OR_DEPARTMENT_OTHER): Payer: Commercial Managed Care - HMO | Admitting: Hematology and Oncology

## 2015-06-25 ENCOUNTER — Encounter: Payer: Self-pay | Admitting: Hematology and Oncology

## 2015-06-25 ENCOUNTER — Telehealth: Payer: Self-pay | Admitting: Hematology and Oncology

## 2015-06-25 ENCOUNTER — Telehealth: Payer: Self-pay | Admitting: *Deleted

## 2015-06-25 VITALS — BP 147/66 | HR 72 | Temp 98.0°F | Resp 18 | Ht 69.0 in | Wt 178.5 lb

## 2015-06-25 DIAGNOSIS — K521 Toxic gastroenteritis and colitis: Secondary | ICD-10-CM | POA: Diagnosis not present

## 2015-06-25 DIAGNOSIS — D61810 Antineoplastic chemotherapy induced pancytopenia: Secondary | ICD-10-CM | POA: Diagnosis not present

## 2015-06-25 DIAGNOSIS — T451X5A Adverse effect of antineoplastic and immunosuppressive drugs, initial encounter: Secondary | ICD-10-CM

## 2015-06-25 DIAGNOSIS — I509 Heart failure, unspecified: Secondary | ICD-10-CM | POA: Insufficient documentation

## 2015-06-25 DIAGNOSIS — C9210 Chronic myeloid leukemia, BCR/ABL-positive, not having achieved remission: Secondary | ICD-10-CM | POA: Diagnosis not present

## 2015-06-25 DIAGNOSIS — I429 Cardiomyopathy, unspecified: Secondary | ICD-10-CM

## 2015-06-25 DIAGNOSIS — J90 Pleural effusion, not elsewhere classified: Secondary | ICD-10-CM | POA: Diagnosis not present

## 2015-06-25 NOTE — Assessment & Plan Note (Signed)
This is likely due to recent treatment. The patient denies recent history of bleeding such as epistaxis, hematuria or hematochezia. He is asymptomatic from the anemia. I will observe for now.  He does not require transfusion now. I will continue the chemotherapy at current dose without dosage adjustment.  If the anemia gets progressive worse in the future, I might have to delay his treatment or adjust the chemotherapy dose.  

## 2015-06-25 NOTE — Telephone Encounter (Signed)
Gave and printed appt sched and avs for pt for Jan 2017 °

## 2015-06-25 NOTE — Telephone Encounter (Signed)
-----   Message from Chesley Mires, MD sent at 06/25/2015  3:25 PM EST ----- Regarding: RE: mild pleural effusion/COPD Can you please schedule ROV with me or Tammy to f/u pleural effusion.  Thanks.  ----- Message -----    From: [name], MD    Sent: 06/25/2015   9:25 AM      To: Chesley Mires, MD Subject: mild pleural effusion/COPD                     Patient would appreciate a return visit to see you for left pleural effusion and COPD  Thanks

## 2015-06-25 NOTE — Progress Notes (Signed)
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 BCR/ABL by PCR detected abnormalitis with b2a2 & b3a2 subtypes   05/09/2012 - 03/16/2015 Chemotherapy He was started on treatment with Dasatinib 100 mg daily   06/06/2012 Adverse Reaction Dose of medication was reduced to 50 mg daily due to pancytopenia   01/24/2013 Progression Patient was noted to have elevated blood count which and was subsequently found to be noncompliant to treatment. The patient has not been on treatment for several months because his prescription ran out. He was restarted back on treatment   01/16/2014 Progression Bcr/ABL by PCR is worse. Dose of Dasatinib was increased to 100 mg.   02/26/2014 Tumor Marker Blood work for ABL kinase mutation was negative.   10/29/2014 Tumor Marker BCR/ABL b2a2 & b3a2 0.29%, IS 0.1624%, not in MMR yet but improving   11/05/2014 Adverse Reaction He had thoracentesis due to pleural effusion   11/27/2014 Surgery He underwent right video-assisted thoracoscopy, Drainage of pleural effusion, Pleural biopsy, Diaphragm biopsy, Lung biopsy & [place] pleurodesis   01/30/2015 Tumor Marker BCR/ABL b2a2 & b3a2 0.78%, IS 0.4368%, not in MMR    03/11/2015 Pathology Results BCR/ABL b2a2 0.19%, IS 0.1064%, not in MMR    03/16/2015 Adverse Reaction Dasatinib was stopped due to recurrent pleural effusion   03/28/2015 -  Chemotherapy He started on Bosutinib   03/30/2015 Procedure He has  therapeutic thoracentesis of the right lung with 1 liter of fluid removed   03/30/2015 Adverse Reaction Bosutinib is placed on hold, to be restart on 8/25 at 250 mg due to severe diarrhea   04/28/2015 Tumor Marker BCR/ABL b2a2 0.06%, IS 0.0336%, in MMR    06/10/2015 Tumor Marker BCR/ABL b2a2 0.14%, IS 0.1162%, not in MMR     INTERVAL HISTORY: Please see below for problem oriented charting.  he returns for further follow-up. According to his wife, he tolerated treatment very poorly. Starting around 05/23/2015, she reduce his dose to a quarter of the pill. Between 05/31/2015 to present, the patient have been skipping doses every  3-5 days due to shortness of breath and diarrhea. When he has profuse diarrhea, he will take his Imodium or Lomotil. Typically, diarrhea would resolve once he discontinued his treatment. He also has significant shortness of breath and underwent chest x-ray and CT scan recently. He denies any cough, fevers or chills.  REVIEW OF SYSTEMS:   Constitutional: Denies fevers, chills or abnormal weight loss Eyes: Denies blurriness of vision Ears, nose, mouth, throat, and face: Denies mucositis or sore throat Cardiovascular: Denies palpitation, chest discomfort or lower extremity swelling Skin: Denies abnormal skin rashes Lymphatics: Denies new lymphadenopathy or easy bruising Neurological:Denies numbness, tingling or new weaknesses Behavioral/Psych: Mood is stable, no new changes  All other systems were reviewed with the patient and are negative.  I have reviewed the past medical history, past surgical history, social history and family history with the patient and they are unchanged from previous note.  ALLERGIES:  is allergic to norvasc and sulfa antibiotics.  MEDICATIONS:  Current Outpatient Prescriptions  Medication Sig Dispense Refill  . aspirin 81 MG chewable tablet Chew 1 tablet (81 mg total) by mouth daily.    Marland Kitchen atorvastatin (LIPITOR) 80 MG tablet TAKE 1 TABLET  EVERY DAY  AT  6:00  PM 90 tablet 1  . bosutinib (BOSULIF) 500 MG tablet Take 1 tablet (500 mg total) by mouth daily with breakfast. Take with food. 30 tablet 6  . carvedilol (COREG) 6.25 MG tablet Take 1 tablet (6.25 mg total) by mouth 2 (two) times daily. 180 tablet 1  . clopidogrel (PLAVIX) 75 MG tablet Take 1 tablet (75 mg total) by mouth daily. 30 tablet 11  . diphenoxylate-atropine (LOMOTIL) 2.5-0.025 MG per tablet Take 1 tablet by mouth 4 (four) times daily. 90 tablet 3  . furosemide (LASIX) 20 MG tablet TAKE ONE TABLET BY MOUTH DAILY 30 tablet 11  . guaiFENesin (MUCINEX) 600 MG 12 hr tablet Take 1200 mg by mouth 2-3 times a day as needed for cough or to loosen phlegm    . KLOR-CON M20 20 MEQ tablet TAKE ONE TABLET BY MOUTH ONCE DAILY 30 tablet 11  . losartan (COZAAR) 25 MG tablet Take 1 tablet (25 mg total) by mouth daily. Stop lisinopril. 30 tablet 5  . nitroGLYCERIN (NITROSTAT) 0.4 MG SL tablet Place 1 tablet (0.4 mg total) under the tongue every 5 (five) minutes as needed for chest pain. 25 tablet 4  . ondansetron (ZOFRAN) 8 MG tablet Take 1 tablet (8 mg total) by mouth every 8 (eight) hours as needed for nausea or vomiting. 60 tablet 5  . potassium chloride SA (K-DUR,KLOR-CON) 20 MEQ tablet Take 20 mEq by mouth daily.     No current facility-administered medications for this visit.    PHYSICAL EXAMINATION: ECOG PERFORMANCE STATUS: 1 - Symptomatic but completely ambulatory  Filed Vitals:   06/25/15 0843  BP: 147/66  Pulse: 72  Temp: 98 F (36.7 C)  Resp: 18   Filed Weights   06/25/15 0843  Weight: 178 lb 8 oz (80.967 kg)    GENERAL:alert, no distress and comfortable SKIN: skin color, texture, turgor are normal, no rashes or significant lesions EYES: normal, Conjunctiva are pink and non-injected, sclera clear OROPHARYNX:no exudate, no erythema and lips, buccal mucosa, and tongue normal  NECK: supple, thyroid normal size, non-tender, without nodularity LYMPH:  no  palpable lymphadenopathy in the cervical, axillary or inguinal LUNGS: clear to auscultation and percussion with normal breathing effort. There is reduced breath sound in the right lung base but I estimated the amount of pleural effusion is small , possibly not amenable to thoracentesis HEART: regular rate & rhythm and no murmurs and no lower extremity edema ABDOMEN:abdomen soft, non-tender and normal bowel sounds Musculoskeletal:no cyanosis of digits and no clubbing  NEURO: alert & oriented x 3 with fluent speech, no focal motor/sensory deficits  LABORATORY DATA:  I have reviewed the data as listed    Component Value Date/Time   NA 143 06/10/2015 0856   NA 136 12/06/2014 0442   K 3.9 06/10/2015 0856   K 3.4* 12/06/2014 0442   CL 102 12/06/2014 0442   CL 110* 11/14/2012 0918   CO2 26 06/10/2015 0856   CO2 24 12/06/2014 0442   GLUCOSE 124 06/10/2015 0856   GLUCOSE 92 12/06/2014 0442   GLUCOSE 108* 11/14/2012 0918   BUN 13.3 06/10/2015 0856   BUN 14 12/06/2014 0442   CREATININE 0.9 06/10/2015 0856   CREATININE 1.16 12/06/2014 0442   CREATININE 1.16 06/19/2014  1610   CALCIUM 8.9 06/10/2015 0856   CALCIUM 7.6* 12/06/2014 0442   PROT 6.3* 06/10/2015 0856   PROT 6.2 03/25/2015 1240   ALBUMIN 3.3* 06/10/2015 0856   ALBUMIN 2.5* 11/29/2014 0400   AST 25 06/10/2015 0856   AST 22 11/29/2014 0400   ALT 25 06/10/2015 0856   ALT 16 11/29/2014 0400   ALKPHOS 92 06/10/2015 0856   ALKPHOS 65 11/29/2014 0400   BILITOT 0.55 06/10/2015 0856   BILITOT 0.8 11/29/2014 0400   GFRNONAA 62* 12/06/2014 0442   GFRNONAA 63 06/19/2014 1610   GFRAA 72* 12/06/2014 0442   GFRAA 73 06/19/2014 1610    No results found for: SPEP, UPEP  Lab Results  Component Value Date   WBC 5.2 06/10/2015   NEUTROABS 3.8 06/10/2015   HGB 11.3* 06/10/2015   HCT 35.5* 06/10/2015   MCV 96.2 06/10/2015   PLT 176 06/10/2015      Chemistry      Component Value Date/Time   NA 143 06/10/2015 0856   NA 136  12/06/2014 0442   K 3.9 06/10/2015 0856   K 3.4* 12/06/2014 0442   CL 102 12/06/2014 0442   CL 110* 11/14/2012 0918   CO2 26 06/10/2015 0856   CO2 24 12/06/2014 0442   BUN 13.3 06/10/2015 0856   BUN 14 12/06/2014 0442   CREATININE 0.9 06/10/2015 0856   CREATININE 1.16 12/06/2014 0442   CREATININE 1.16 06/19/2014 1610      Component Value Date/Time   CALCIUM 8.9 06/10/2015 0856   CALCIUM 7.6* 12/06/2014 0442   ALKPHOS 92 06/10/2015 0856   ALKPHOS 65 11/29/2014 0400   AST 25 06/10/2015 0856   AST 22 11/29/2014 0400   ALT 25 06/10/2015 0856   ALT 16 11/29/2014 0400   BILITOT 0.55 06/10/2015 0856   BILITOT 0.8 11/29/2014 0400       RADIOGRAPHIC STUDIES: I reviewed his recent CT scan and chest x-ray I have personally reviewed the radiological images as listed and agreed with the findings in the report.    ASSESSMENT & PLAN:  Chronic myelogenous leukemia  The patient has poor tolerance to treatment. His wife had reduced the dose drastically to only a quarter of the dose and the patient had been skipping doses due to shortness of breath and diarrhea. Due to this noncompliance with treatment, unfortunately, BCR/ABL transcript has increased significantly.  I discussed with them potential referral to tertiary center for second opinion. Ultimately, after a lot of discussions, the patient is in agreement to try to take his medications on a daily basis.  Pleural effusion on left He has persistent pleural effusion on the left side. On clinical exam, the amount detected on physical examination is really small and I do not believe he would benefit significantly from thoracentesis.  the patient is in agreement for referral to see his pulmonologist again for further evaluation and treatment  I plan to reorder chest x-ray prior to his next visit.  I suspect his shortness of breath a likely combination of mild congestive heart failure, pleural effusion and COPD  Diarrhea due to drug  He  has significant diarrhea causing significant interruption of his treatment. I recommend he take reduced dose and to take Imodium or Lomotil as needed to control his diarrhea rather than stopping his treatment altogether.  Pancytopenia due to antineoplastic chemotherapy [facility]) This is likely due to recent treatment. The patient denies recent history of bleeding such as epistaxis, hematuria or hematochezia. He is asymptomatic from  the anemia. I will observe for now.  He does not require transfusion now. I will continue the chemotherapy at current dose without dosage adjustment.  If the anemia gets progressive worse in the future, I might have to delay his treatment or adjust the chemotherapy dose.   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 Clinically, he does not have signs of volume overload. He will continue medical management.   Orders Placed This Encounter  Procedures  . CBC with Differential/Platelet    Standing Status: Future     Number of Occurrences:      Standing Expiration Date: 07/29/2016  . Comprehensive metabolic panel    Standing Status: Future     Number of Occurrences:      Standing Expiration Date: 07/29/2016  . BCR-ABL    With RT-PCR technique    Standing Status: Future     Number of Occurrences:      Standing Expiration Date: 07/29/2016   All questions were answered. The patient knows to call the clinic with any problems, questions or concerns. No barriers to learning was detected. I spent 30 minutes counseling the patient face to face. The total time spent in the appointment was 40 minutes and more than 50% was on counseling and review of test results     [facility], Lavaughn Haberle, MD 06/25/2015 9:22 AM

## 2015-06-25 NOTE — Telephone Encounter (Signed)
Per staff message from Dr. Halford Chessman: Called to schedule appointment with patient, he has gone out hunting and will not be back until late tonight per wife, she said she would have patient call tomorrow to schedule appointment.  To [place] for follow up

## 2015-06-25 NOTE — Assessment & Plan Note (Signed)
He has significant diarrhea causing significant interruption of his treatment. I recommend he take reduced dose and to take Imodium or Lomotil as needed to control his diarrhea rather than stopping his treatment altogether.

## 2015-06-25 NOTE — Assessment & Plan Note (Addendum)
He has persistent pleural effusion on the left side. On clinical exam, the amount detected on physical examination is really small and I do not believe he would benefit significantly from thoracentesis.  the patient is in agreement for referral to see his pulmonologist again for further evaluation and treatment  I plan to reorder chest x-ray prior to his next visit.  I suspect his shortness of breath a likely combination of mild congestive heart failure, pleural effusion and COPD

## 2015-06-25 NOTE — Assessment & Plan Note (Signed)
The patient has poor tolerance to treatment. His wife had reduced the dose drastically to only a quarter of the dose and the patient had been skipping doses due to shortness of breath and diarrhea. Due to this noncompliance with treatment, unfortunately, BCR/ABL transcript has increased significantly.  I discussed with them potential referral to tertiary center for second opinion. Ultimately, after a lot of discussions, the patient is in agreement to try to take his medications on a daily basis.

## 2015-06-25 NOTE — Assessment & Plan Note (Signed)
I suspect his shortness of breath is multifactorial, combination of congestive heart failure from cardiomyopathy, pleural effusion and mild COPD. Clinically, he does not have signs of volume overload. He will continue medical management. 

## 2015-06-26 NOTE — Telephone Encounter (Signed)
Called and spoke to pt's wife. Appt made for 12/13 with TP. Pt's wife verbalized understanding and denied any further questions or concerns at this time.

## 2015-07-16 ENCOUNTER — Telehealth: Payer: Self-pay | Admitting: Hematology and Oncology

## 2015-07-16 NOTE — Telephone Encounter (Signed)
Faxed pt medical records to [place]

## 2015-07-21 ENCOUNTER — Ambulatory Visit: Payer: Commercial Managed Care - HMO | Admitting: Adult Health

## 2015-07-24 ENCOUNTER — Other Ambulatory Visit: Payer: Self-pay | Admitting: *Deleted

## 2015-07-24 ENCOUNTER — Other Ambulatory Visit: Payer: Self-pay | Admitting: Family Medicine

## 2015-07-24 ENCOUNTER — Other Ambulatory Visit: Payer: Self-pay | Admitting: Hematology and Oncology

## 2015-07-24 NOTE — Telephone Encounter (Signed)
Medication refilled per protocol. 

## 2015-07-24 NOTE — Telephone Encounter (Signed)
Unable to e-scribe Lomotil.   Unable to get through to [facility] by phone,  Keep getting busy signal.  Faxed Rx to [facility] at [phone].   Notified wife of refill sent to [facility].

## 2015-07-28 ENCOUNTER — Other Ambulatory Visit: Payer: Self-pay

## 2015-07-28 NOTE — Patient Outreach (Signed)
[facility]) Care Management  07/28/2015  [facility] Wolfe 06/05/1946 [id number]   Telephone Screen  Referral Date: 07/27/15 Referral Source: [facility] tier 4 list Referral Reason: COPD with 1 ED visit and 3 hospital admits  Outreach attempt #1 to patient. Patient reached. Patient states he is doing very well at present time. 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 He states he has appt with heart MD coming up soon. He denies any issues with meds or transportation. Patient reports that he remains independent with his care. Discussed and reviewed [facility] services with patient. He was offered program earlier this year and declined. He reported that he is still doing fine managing his care. He denies any CM needs or concerns at this time. Patient appreciative of call but declined services. Case is being closed at this time.   Plan: RN CM will notify [facility] administrative assistant of case status. RN CM will send case closure letter to MD.  Enzo Montgomery, RN,BSN,CCM [place] Management Telephonic Care Management Coordinator Direct Phone: [phone] Toll Free: [phone] Fax: [phone]

## 2015-08-05 ENCOUNTER — Ambulatory Visit: Payer: Commercial Managed Care - HMO | Admitting: Physician Assistant

## 2015-08-12 ENCOUNTER — Telehealth: Payer: Self-pay | Admitting: *Deleted

## 2015-08-12 NOTE — Telephone Encounter (Signed)
Submitted humana referral thru acuity connect for authorization on 07/28/15 to Dr. Herbert Pun with authorization number [id number]  Requesting provider: Flonnie Hailstone  Treating provider: Herbert Pun  Number of visits: 1  Start Date:08/05/15  End Date:08/05/15  Dx:I47.2-Ventricular tachycardia      I50.9-Heart Failure,unspecified

## 2015-08-14 ENCOUNTER — Other Ambulatory Visit (HOSPITAL_BASED_OUTPATIENT_CLINIC_OR_DEPARTMENT_OTHER): Payer: Commercial Managed Care - HMO

## 2015-08-14 DIAGNOSIS — C9210 Chronic myeloid leukemia, BCR/ABL-positive, not having achieved remission: Secondary | ICD-10-CM

## 2015-08-14 LAB — CBC WITH DIFFERENTIAL/PLATELET
BASO%: 1 % (ref 0.0–2.0)
Basophils Absolute: 0.1 10*3/uL (ref 0.0–0.1)
EOS%: 4.6 % (ref 0.0–7.0)
Eosinophils Absolute: 0.2 10*3/uL (ref 0.0–0.5)
HEMATOCRIT: 33.5 % — AB (ref 38.4–49.9)
HEMOGLOBIN: 10.8 g/dL — AB (ref 13.0–17.1)
LYMPH#: 0.8 10*3/uL — AB (ref 0.9–3.3)
LYMPH%: 15.9 % (ref 14.0–49.0)
MCH: 30.6 pg (ref 27.2–33.4)
MCHC: 32.2 g/dL (ref 32.0–36.0)
MCV: 94.9 fL (ref 79.3–98.0)
MONO#: 0.5 10*3/uL (ref 0.1–0.9)
MONO%: 10.1 % (ref 0.0–14.0)
NEUT%: 68.4 % (ref 39.0–75.0)
NEUTROS ABS: 3.5 10*3/uL (ref 1.5–6.5)
Platelets: 125 10*3/uL — ABNORMAL LOW (ref 140–400)
RBC: 3.53 10*6/uL — ABNORMAL LOW (ref 4.20–5.82)
RDW: 15.8 % — AB (ref 11.0–14.6)
WBC: 5.2 10*3/uL (ref 4.0–10.3)

## 2015-08-14 LAB — COMPREHENSIVE METABOLIC PANEL
ALBUMIN: 3.4 g/dL — AB (ref 3.5–5.0)
ALK PHOS: 105 U/L (ref 40–150)
ALT: 38 U/L (ref 0–55)
AST: 32 U/L (ref 5–34)
Anion Gap: 8 mEq/L (ref 3–11)
BILIRUBIN TOTAL: 0.48 mg/dL (ref 0.20–1.20)
BUN: 13.2 mg/dL (ref 7.0–26.0)
CALCIUM: 8.5 mg/dL (ref 8.4–10.4)
CO2: 25 mEq/L (ref 22–29)
CREATININE: 1.3 mg/dL (ref 0.7–1.3)
Chloride: 110 mEq/L — ABNORMAL HIGH (ref 98–109)
EGFR: 57 mL/min/{1.73_m2} — ABNORMAL LOW (ref >90)
GLUCOSE: 128 mg/dL (ref 70–140)
POTASSIUM: 3.8 meq/L (ref 3.5–5.1)
Sodium: 143 mEq/L (ref 136–145)
TOTAL PROTEIN: 6.7 g/dL (ref 6.4–8.3)

## 2015-08-14 IMAGING — CT CT CHEST W/O CM
3 of 4 series · 16 of 30 positions shown, 18 images · non-contrast
Comparison: 06/10/2014

CLINICAL DATA: Followup recurrent right pleural effusion. History
of lung nodule.

EXAM:
CT CHEST WITHOUT CONTRAST
TECHNIQUE: Multidetector CT imaging of the chest was performed following the
standard protocol without IV contrast..

[Series 3: chest w/o · axial · non-contrast · 0.72mm/px · z∈[-244,-49]mm · 4 of 67 slices shown]
[im 14/67  lung]
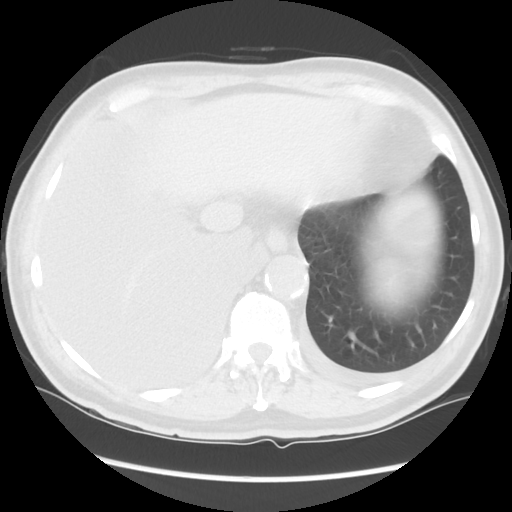
[im 27/67  lung]
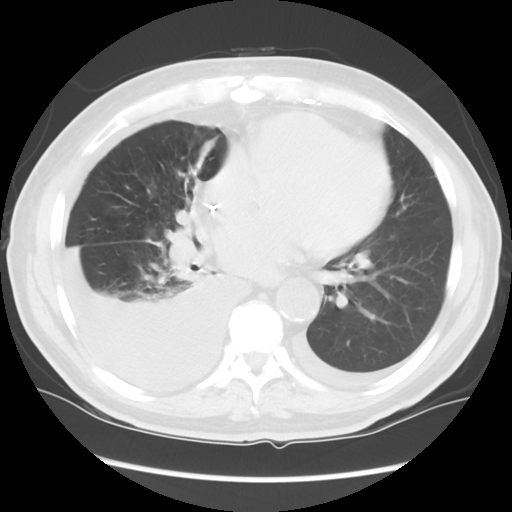
[im 40/67  lung]
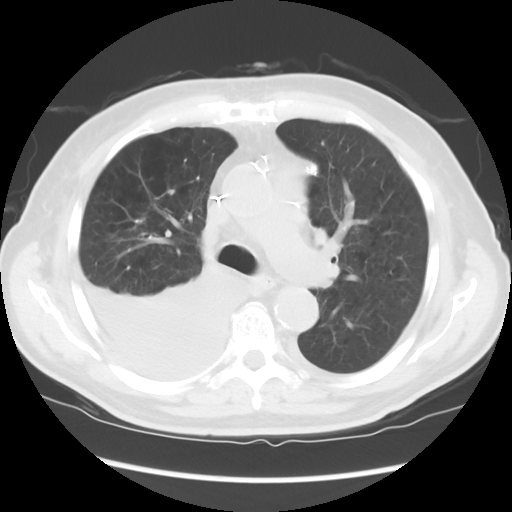
[im 53/67  lung]
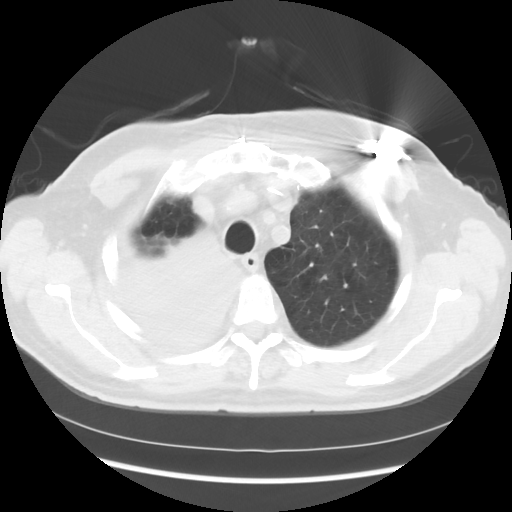

[Series 4: lung windows · axial · 0.72mm/px · z∈[-254,-34]mm · 5 of 67 slices shown, 7 images]
[im 12/67  mediastinal]
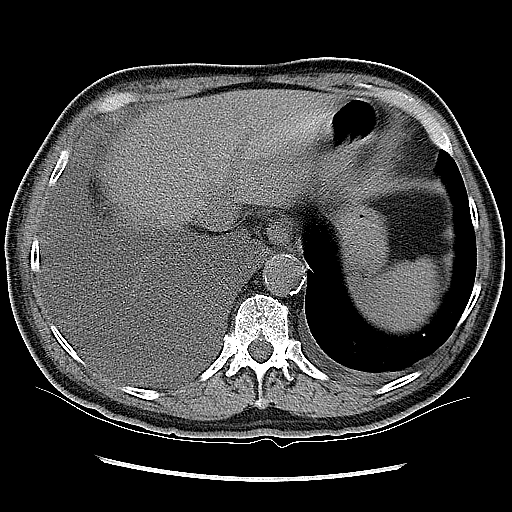
[im 12/67  lung]
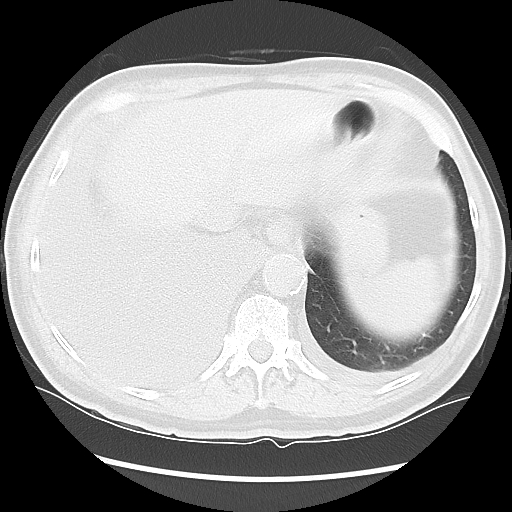
[im 23/67  lung]
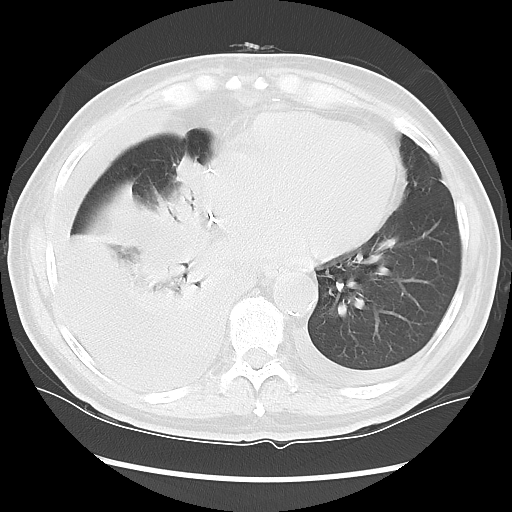
[im 34/67  lung]
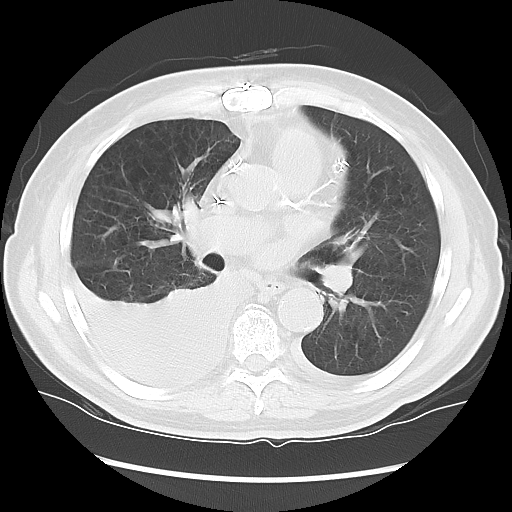
[im 45/67  lung]
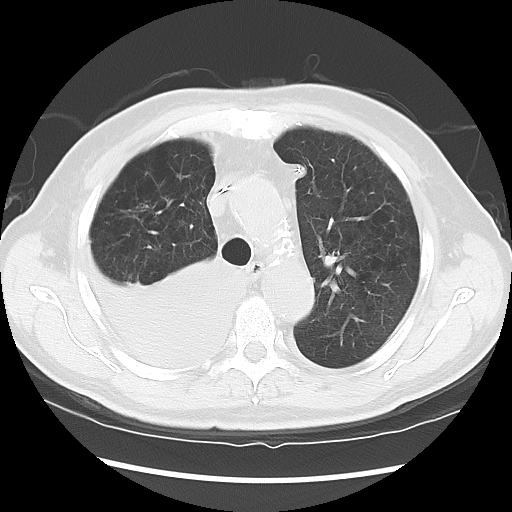
[im 56/67  mediastinal]
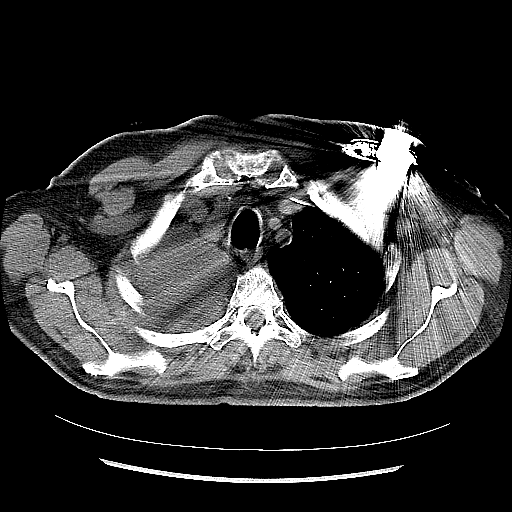
[im 56/67  lung]
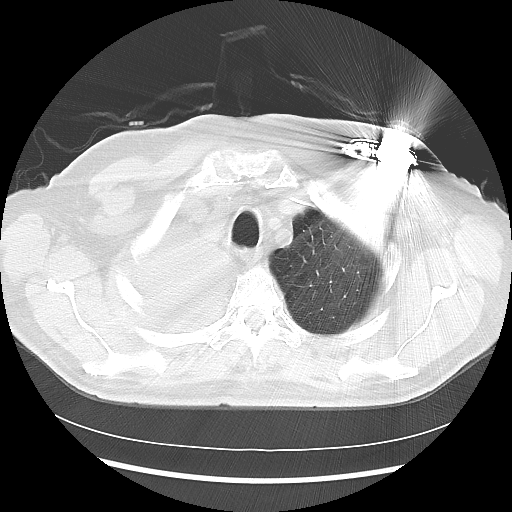

[Series 602: sagittal body · sagittal · 0.72mm/px · 7 of 149 slices shown]
[im 11/149  mediastinal]
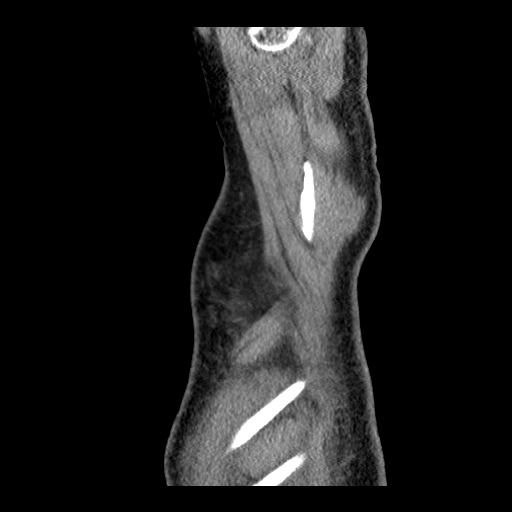
[im 32/149  mediastinal]
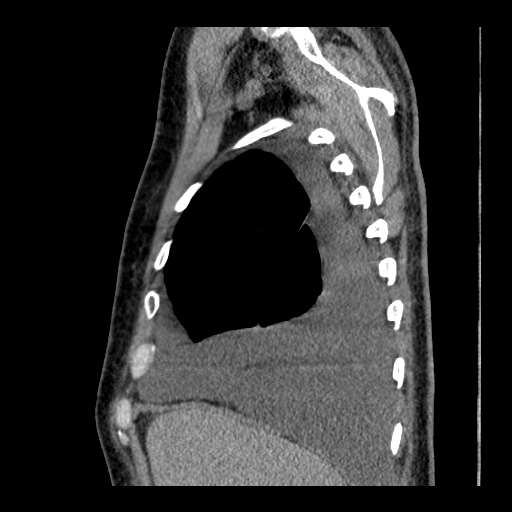
[im 53/149  mediastinal]
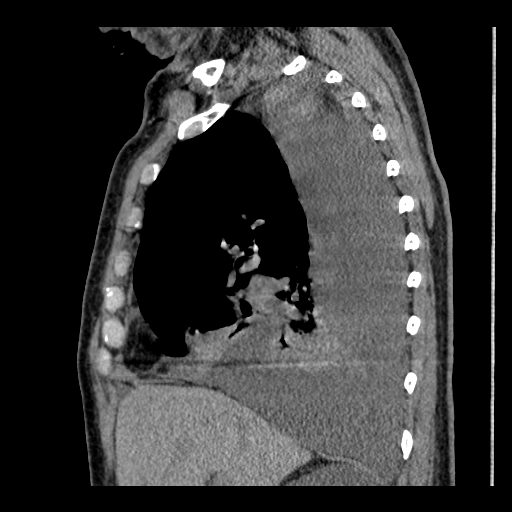
[im 64/149  mediastinal]
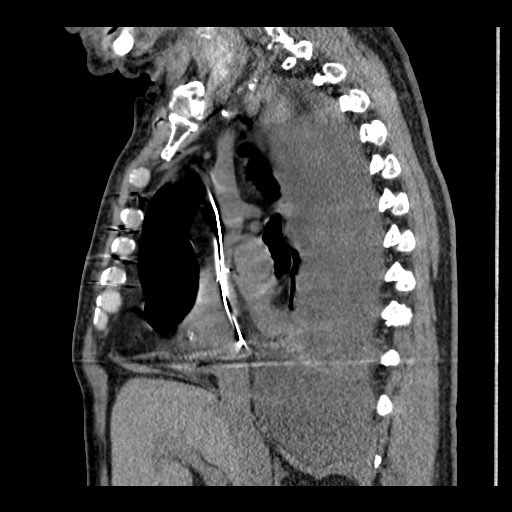
[im 85/149  mediastinal]
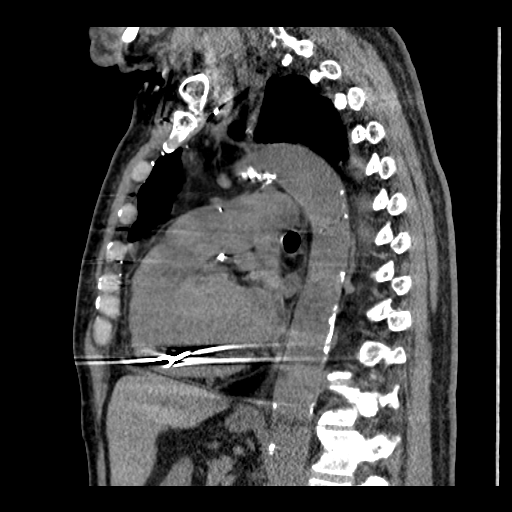
[im 96/149  mediastinal]
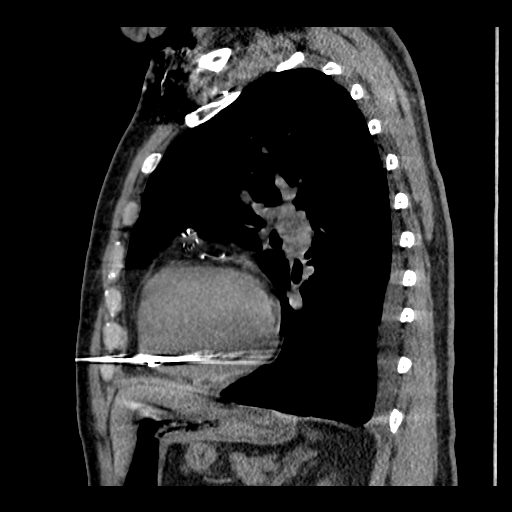
[im 117/149  mediastinal]
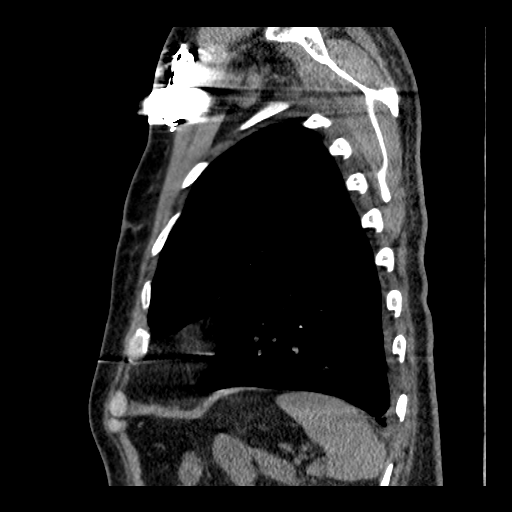

[16 of 30 positions shown; findings below may reference images not displayed]

FINDINGS: Mediastinum: Previous median sternotomy and CABG procedure. The
heart size is moderately enlarged. No pericardial effusion
identified. 9 mm right paratracheal lymph node is identified, image
26/series 3. Previously 11 mm. The trachea appears patent and is
midline. Normal appearance of the esophagus.

Lungs/Pleura: There is a large right pleural effusion that appears
increased in volume from previous exam. There is compressive type
consolidation involving the right middle lobe and right lower lobe.
New small left pleural effusion. Moderate changes of centrilobular
and paraseptal emphysema. Diffuse bronchial wall thickening noted.
Improved appearance of post inflammatory/infectious changes within
the right upper lobe. Subpleural nodule within the left lower lobe
measures 4 mm, image 37/series 4 and is stable from previous exam.
Right middle lobe pulmonary nodule measures 3 mm and has a
nonspecific appearance but is new from previous exam.

Upper Abdomen: Incidental imaging through the upper abdomen shows a
low attenuation structure in the right hepatic which is too small to
characterize but appears unchanged measuring 7 mm, image 65/series
3. The in stress set stable asymmetric enlargement of the left
adrenal gland. Large cyst arises from the upper pole right kidney,
partially visualized.

Musculoskeletal: Review of the visualized osseous structures is
negative for aggressive lytic or sclerotic bone lesion.
IMPRESSION: 1. Increase in volume of right pleural effusion. New left effusion.
There is associated compressive type consolidation involving the
right lower lobe and right middle lobe.
2. Diffuse bronchial wall thickening with emphysema, as above;
imaging findings suggestive of underlying COPD.
3. Postinflammatory/infectious changes within the right lung have
improved from previous exam.
4. Small pulmonary nodules are again noted measuring up to 4 mm.
There is a new nodule in the right middle lobe which measures 3 mm.
In a patient that is at increased risk for bronchogenic carcinoma,
follow-up chest CT at 1 year is recommended. If the patient is at
low risk, no follow-up is needed. This recommendation follows the
consensus statement: Guidelines for Management of Small Pulmonary
Nodules Detected on CT Scans: A Statement from the [employer] [place] [place]

## 2015-08-15 ENCOUNTER — Other Ambulatory Visit: Payer: Self-pay | Admitting: Family Medicine

## 2015-08-16 IMAGING — CR DG CHEST 1V PORT
1 series · 1 of 1 positions shown · non-contrast
Comparison: 11/26/2014

CLINICAL DATA: Status post right VATS, central line placement

EXAM:
PORTABLE CHEST - 1 VIEW

[AP]
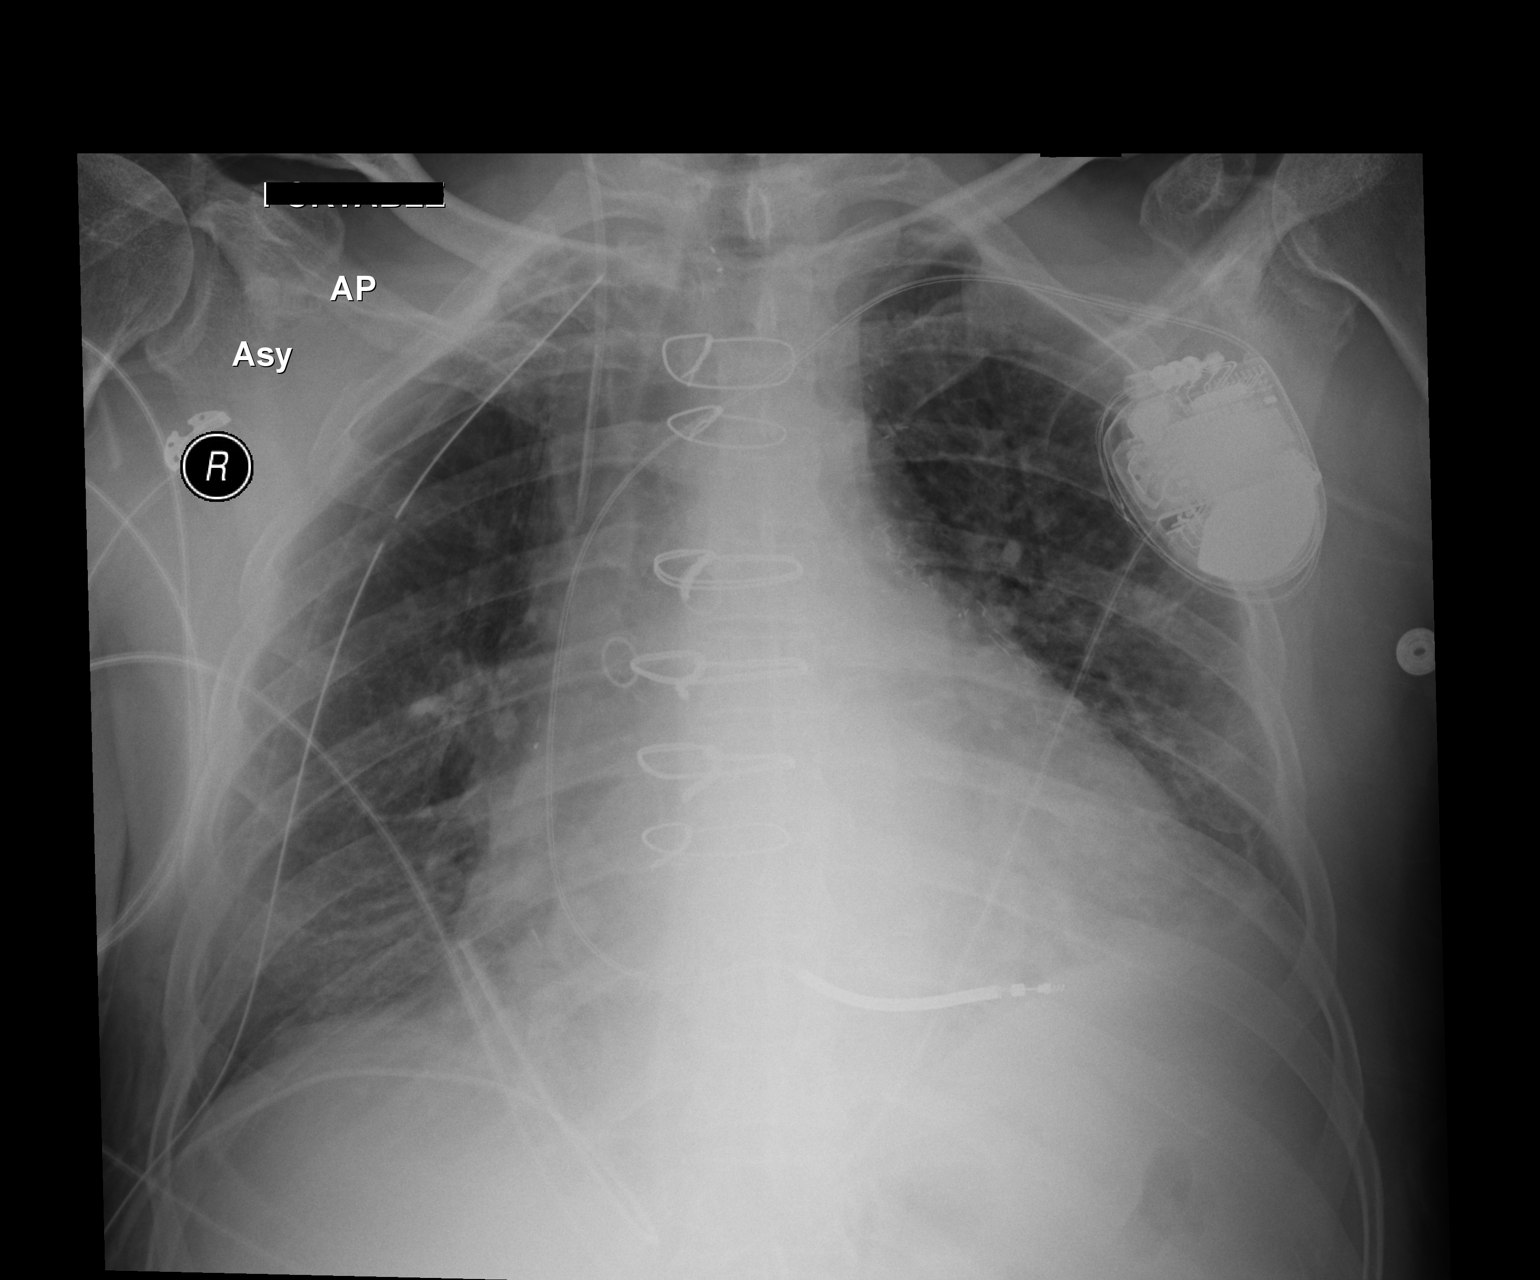

[1 of 1 positions shown; findings below may reference images not displayed]

FINDINGS: Postsurgical changes are again seen. The cardiac shadow remains
enlarged. There has been interval intervention on the right with
placement of a chest tube in the right apex. A right central venous
line is noted as well. No pneumothorax is noted. A second
right-sided chest tube is noted along the base with resolution of
previously seen pleural fluid. The left lung remains clear. Cardiac
device is again identified on the left
IMPRESSION: Postoperative changes with thoracostomy catheters and right central
venous line. No acute complication is noted.

## 2015-08-20 ENCOUNTER — Telehealth: Payer: Self-pay | Admitting: *Deleted

## 2015-08-20 NOTE — Telephone Encounter (Signed)
Submitted humana referral thru acuity connect for authorization to Dr. Herbert Pun with authorization [id number]  Requesting provider: Flonnie Hailstone  Treating provider: Herbert Pun  Number of visits:6  Start Date:08/27/15  End Date:02/23/16  Dx:I47.2- Ventricular tachycardia       I50.23- Acute on chronic systolic heart failure

## 2015-08-24 ENCOUNTER — Other Ambulatory Visit: Payer: Self-pay | Admitting: *Deleted

## 2015-08-24 ENCOUNTER — Telehealth: Payer: Self-pay | Admitting: *Deleted

## 2015-08-24 ENCOUNTER — Other Ambulatory Visit: Payer: Self-pay | Admitting: Family Medicine

## 2015-08-24 ENCOUNTER — Other Ambulatory Visit: Payer: Self-pay | Admitting: Hematology and Oncology

## 2015-08-24 NOTE — Telephone Encounter (Signed)
Medication refilled per protocol. 

## 2015-08-24 NOTE — Telephone Encounter (Signed)
Refill request for lomotil received by Dr. Alvy Bimler.   Refill called into [facility] at [place]/ LVM on refill line.  Notified wife of refill called.

## 2015-08-25 ENCOUNTER — Ambulatory Visit (HOSPITAL_BASED_OUTPATIENT_CLINIC_OR_DEPARTMENT_OTHER): Payer: Commercial Managed Care - HMO | Admitting: Hematology and Oncology

## 2015-08-25 ENCOUNTER — Telehealth: Payer: Self-pay | Admitting: Hematology and Oncology

## 2015-08-25 ENCOUNTER — Encounter: Payer: Self-pay | Admitting: Hematology and Oncology

## 2015-08-25 VITALS — BP 133/41 | HR 74 | Temp 98.0°F | Resp 18 | Ht 69.0 in | Wt 173.8 lb

## 2015-08-25 DIAGNOSIS — D61810 Antineoplastic chemotherapy induced pancytopenia: Secondary | ICD-10-CM | POA: Diagnosis not present

## 2015-08-25 DIAGNOSIS — K521 Toxic gastroenteritis and colitis: Secondary | ICD-10-CM | POA: Diagnosis not present

## 2015-08-25 DIAGNOSIS — C9210 Chronic myeloid leukemia, BCR/ABL-positive, not having achieved remission: Secondary | ICD-10-CM

## 2015-08-25 DIAGNOSIS — T451X5A Adverse effect of antineoplastic and immunosuppressive drugs, initial encounter: Secondary | ICD-10-CM

## 2015-08-25 NOTE — Assessment & Plan Note (Signed)
He tolerated treatment well with minimal expected side effects. We will continue same treatment without dose adjustment. The patient is delighted to know that he has finally achieved major molecular response.

## 2015-08-25 NOTE — Progress Notes (Signed)
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 BCR/ABL by PCR detected abnormalitis with b2a2 & b3a2 subtypes   05/09/2012 - 03/16/2015 Chemotherapy He was started on treatment with Dasatinib 100 mg daily   06/06/2012 Adverse Reaction Dose of medication was reduced to 50 mg daily due to pancytopenia   01/24/2013 Progression Patient was noted to have elevated blood count which and was subsequently found to be noncompliant to treatment. The patient has not been on treatment for several months because his prescription ran out. He was restarted back on treatment   01/16/2014 Progression Bcr/ABL by PCR is worse. Dose of Dasatinib was increased to 100 mg.   02/26/2014 Tumor Marker Blood work for ABL kinase mutation was negative.   10/29/2014 Tumor Marker BCR/ABL b2a2 & b3a2 0.29%, IS 0.1624%, not in MMR yet but improving   11/05/2014 Adverse Reaction He had thoracentesis due to pleural effusion   11/27/2014 Surgery He underwent right video-assisted thoracoscopy, Drainage of pleural effusion, Pleural biopsy, Diaphragm biopsy, Lung biopsy & Talc pleurodesis   01/30/2015 Tumor Marker BCR/ABL b2a2 & b3a2 0.78%, IS 0.4368%, not in MMR    03/11/2015 Pathology Results BCR/ABL b2a2 0.19%, IS 0.1064%, not in MMR    03/16/2015 Adverse Reaction Dasatinib was stopped due to recurrent pleural effusion   03/28/2015 -  Chemotherapy He started on Bosutinib   03/30/2015 Procedure He has  therapeutic thoracentesis of the right lung with 1 liter of fluid removed   03/30/2015 Adverse Reaction Bosutinib is placed on hold, to be restart on 8/25 at 250 mg due to severe diarrhea   04/28/2015 Tumor Marker BCR/ABL b2a2 0.06%, IS 0.0336%, in MMR    06/10/2015 Tumor Marker BCR/ABL b2a2 0.14%, IS 0.1162%, not in MMR    08/14/2015 Pathology Results BCR/ABL b2a2 0.02%, IS 0.0166%, In MMR     INTERVAL HISTORY: Please see below for problem oriented charting. He is able to tolerate full dose Bosutinib well. He feels well. He continues to have diarrhea, well controlled with combination of Zofran and Lomotil. He has lost a little bit of weight but otherwise feels healthy. Denies recent infection.  REVIEW OF SYSTEMS:   Constitutional: Denies fevers, chills or abnormal weight loss Eyes: Denies blurriness of vision Ears, nose, mouth, throat, and face: Denies mucositis or sore throat Respiratory: Denies cough, dyspnea or wheezes Cardiovascular: Denies palpitation, chest discomfort or lower extremity swelling Skin: Denies abnormal skin rashes Lymphatics: Denies new lymphadenopathy or easy bruising Neurological:Denies numbness, tingling or new weaknesses Behavioral/Psych: Mood is stable, no new changes  All other systems were reviewed with the patient and are negative.  I have reviewed the past medical history, past surgical history, social history and family history with the patient and they are unchanged from previous note.  ALLERGIES:  is allergic to norvasc and sulfa antibiotics.  MEDICATIONS:  Current Outpatient Prescriptions  Medication Sig Dispense Refill  . aspirin 81 MG chewable tablet Chew 1 tablet (81 mg total) by mouth daily.    Marland Kitchen atorvastatin (LIPITOR) 80 MG tablet  TAKE 1 TABLET EVERY DAY  AT  6:00  PM 90 tablet 1  . bosutinib (BOSULIF) 500 MG tablet Take 1 tablet (500 mg total) by mouth daily with breakfast. Take with food. 30 tablet 6  . carvedilol (COREG) 6.25 MG tablet TAKE  ONE TABLET BY MOUTH TWICE DAILY 180 tablet 0  . clopidogrel (PLAVIX) 75 MG tablet Take 1 tablet (75 mg total) by mouth daily. 30 tablet 11  . diphenoxylate-atropine (LOMOTIL) 2.5-0.025 MG tablet TAKE ONE TABLET BY MOUTH 4 TIMES DAILY 90 tablet 0  . furosemide (LASIX) 20 MG tablet TAKE ONE TABLET BY MOUTH DAILY 30 tablet 11  . guaiFENesin (MUCINEX) 600 MG 12 hr tablet Take 1200 mg by mouth 2-3 times a day as needed for cough or to loosen phlegm    . KLOR-CON M20 20 MEQ tablet TAKE ONE TABLET BY MOUTH ONCE DAILY 30 tablet 11  . losartan (COZAAR) 25 MG tablet Take 1 tablet (25 mg total) by mouth daily. 30 tablet 11  . nitroGLYCERIN (NITROSTAT) 0.4 MG SL tablet Place 1 tablet (0.4 mg total) under the tongue every 5 (five) minutes as needed for chest pain. 25 tablet 4  . ondansetron (ZOFRAN) 8 MG tablet Take 1 tablet (8 mg total) by mouth every 8 (eight) hours as needed for nausea or vomiting. 60 tablet 5  . potassium chloride SA (K-DUR,KLOR-CON) 20 MEQ tablet Take 20 mEq by mouth daily.     No current facility-administered medications for this visit.    PHYSICAL EXAMINATION: ECOG PERFORMANCE STATUS: 1 - Symptomatic but completely ambulatory  Filed Vitals:   08/25/15 0837  BP: 133/41  Pulse: 74  Temp: 98 F (36.7 C)  Resp: 18   Filed Weights   08/25/15 0837  Weight: 173 lb 12.8 oz (78.835 kg)    GENERAL:alert, no distress and comfortable SKIN: skin color, texture, turgor are normal, no rashes or significant lesions EYES: normal, Conjunctiva are pink and non-injected, sclera clear OROPHARYNX:no exudate, no erythema and lips, buccal mucosa, and tongue normal  NECK: supple, thyroid normal size, non-tender, without nodularity LYMPH:  no palpable lymphadenopathy in the cervical, axillary or inguinal LUNGS: clear to auscultation and percussion with normal breathing effort HEART: regular rate & rhythm and no murmurs and no lower extremity edema ABDOMEN:abdomen soft, non-tender and normal  bowel sounds Musculoskeletal:no cyanosis of digits and no clubbing  NEURO: alert & oriented x 3 with fluent speech, no focal motor/sensory deficits  LABORATORY DATA:  I have reviewed the data as listed    Component Value Date/Time   NA 143 08/14/2015 0840   NA 136 12/06/2014 0442   K 3.8 08/14/2015 0840   K 3.4* 12/06/2014 0442   CL 102 12/06/2014 0442   CL 110* 11/14/2012 0918   CO2 25 08/14/2015 0840   CO2 24 12/06/2014 0442   GLUCOSE 128 08/14/2015 0840   GLUCOSE 92 12/06/2014 0442   GLUCOSE 108* 11/14/2012 0918   BUN 13.2 08/14/2015 0840   BUN 14 12/06/2014 0442   CREATININE 1.3 08/14/2015 0840   CREATININE 1.16 12/06/2014 0442   CREATININE 1.16 06/19/2014 1610   CALCIUM 8.5 08/14/2015 0840   CALCIUM 7.6* 12/06/2014 0442   PROT 6.7 08/14/2015 0840   PROT 6.2 03/25/2015 1240   ALBUMIN 3.4* 08/14/2015 0840   ALBUMIN 2.5* 11/29/2014 0400   AST 32 08/14/2015 0840   AST 22 11/29/2014 0400   ALT 38 08/14/2015 0840   ALT 16 11/29/2014 0400   ALKPHOS 105 08/14/2015 0840  ALKPHOS 65 11/29/2014 0400   BILITOT 0.48 08/14/2015 0840   BILITOT 0.8 11/29/2014 0400   GFRNONAA 62* 12/06/2014 0442   GFRNONAA 63 06/19/2014 1610   GFRAA 72* 12/06/2014 0442   GFRAA 73 06/19/2014 1610    No results found for: SPEP, UPEP  Lab Results  Component Value Date   WBC 5.2 08/14/2015   NEUTROABS 3.5 08/14/2015   HGB 10.8* 08/14/2015   HCT 33.5* 08/14/2015   MCV 94.9 08/14/2015   PLT 125* 08/14/2015      Chemistry      Component Value Date/Time   NA 143 08/14/2015 0840   NA 136 12/06/2014 0442   K 3.8 08/14/2015 0840   K 3.4* 12/06/2014 0442   CL 102 12/06/2014 0442   CL 110* 11/14/2012 0918   CO2 25 08/14/2015 0840   CO2 24 12/06/2014 0442   BUN 13.2 08/14/2015 0840   BUN 14 12/06/2014 0442   CREATININE 1.3 08/14/2015 0840   CREATININE 1.16 12/06/2014 0442   CREATININE 1.16 06/19/2014 1610      Component Value Date/Time   CALCIUM 8.5 08/14/2015 0840   CALCIUM 7.6*  12/06/2014 0442   ALKPHOS 105 08/14/2015 0840   ALKPHOS 65 11/29/2014 0400   AST 32 08/14/2015 0840   AST 22 11/29/2014 0400   ALT 38 08/14/2015 0840   ALT 16 11/29/2014 0400   BILITOT 0.48 08/14/2015 0840   BILITOT 0.8 11/29/2014 0400      ASSESSMENT & PLAN:  Chronic myelogenous leukemia He tolerated treatment well with minimal expected side effects. We will continue same treatment without dose adjustment. The patient is delighted to know that he has finally achieved major molecular response.  Pancytopenia due to antineoplastic chemotherapy [facility]) This is likely due to recent treatment. The patient denies recent history of bleeding such as epistaxis, hematuria or hematochezia. He is asymptomatic from the anemia. I will observe for now.  He does not require transfusion now. I will continue the chemotherapy at current dose without dosage adjustment.  If the anemia gets progressive worse in the future, I might have to delay his treatment or adjust the chemotherapy dose.     Diarrhea due to drug  He has significant diarrhea in the past causing significant interruption of his treatment. I recommend continues to take Imodium or Lomotil as needed to control his diarrhea and so far he is doing well   Orders Placed This Encounter  Procedures  . Comprehensive metabolic panel    Standing Status: Future     Number of Occurrences:      Standing Expiration Date: 09/28/2016  . BCR-ABL    With RT-PCR technique    Standing Status: Future     Number of Occurrences:      Standing Expiration Date: 09/28/2016  . CBC with Differential/Platelet    Standing Status: Future     Number of Occurrences:      Standing Expiration Date: 09/28/2016   All questions were answered. The patient knows to call the clinic with any problems, questions or concerns. No barriers to learning was detected. I spent 15 minutes counseling the patient face to face. The total time spent in the appointment was 20 minutes and  more than 50% was on counseling and review of test results     [facility], [place], MD 08/25/2015 9:07 AM

## 2015-08-25 NOTE — Assessment & Plan Note (Signed)
He has significant diarrhea in the past causing significant interruption of his treatment. I recommend continues to take Imodium or Lomotil as needed to control his diarrhea and so far he is doing well 

## 2015-08-25 NOTE — Assessment & Plan Note (Signed)
This is likely due to recent treatment. The patient denies recent history of bleeding such as epistaxis, hematuria or hematochezia. He is asymptomatic from the anemia. I will observe for now.  He does not require transfusion now. I will continue the chemotherapy at current dose without dosage adjustment.  If the anemia gets progressive worse in the future, I might have to delay his treatment or adjust the chemotherapy dose.  

## 2015-08-25 NOTE — Telephone Encounter (Signed)
Talked to patient here in office. Scheduled appt.       [facility]. °

## 2015-08-27 ENCOUNTER — Encounter: Payer: Self-pay | Admitting: Internal Medicine

## 2015-08-27 ENCOUNTER — Ambulatory Visit (INDEPENDENT_AMBULATORY_CARE_PROVIDER_SITE_OTHER): Payer: Commercial Managed Care - HMO | Admitting: Internal Medicine

## 2015-08-27 VITALS — BP 120/68 | HR 64 | Ht 69.0 in | Wt 178.2 lb

## 2015-08-27 DIAGNOSIS — Z95810 Presence of automatic (implantable) cardiac defibrillator: Secondary | ICD-10-CM | POA: Diagnosis not present

## 2015-08-27 DIAGNOSIS — I255 Ischemic cardiomyopathy: Secondary | ICD-10-CM | POA: Diagnosis not present

## 2015-08-27 DIAGNOSIS — I472 Ventricular tachycardia, unspecified: Secondary | ICD-10-CM

## 2015-08-27 NOTE — Progress Notes (Signed)
.      Patient Care Team: [name], MD as PCP - General (Family Medicine) [name], MD as Attending Physician (Cardiology) [name], MD (Family Medicine) [name], MD (Cardiothoracic Surgery) [name], MD as Attending Physician (Vascular Surgery) [name], MD as Consulting Physician (Hematology and Oncology) Deboraha Sprang, MD as Consulting Physician (Cardiology)   HPI  Anthony Wolfe is a 70 y.o. male Seen in follow-up for an ICD implanted for secondary prevention after he presented with ventricular tachycardia 10/15. Catheterization demonstrated occluded vein graft to his RCA ejection fraction was about 40%  He is status post bypass surgery 2010  He underwent EP testing and was found to be inducible.  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   He underwent a thoracentesis initially in 11/15 and redo 12/15.  He saw pulm 1/16 with question as to whether immunotherpay could be explanation of effusion;  He had pleurodesis. They changed his chemotherapy knee has had no recurrent effusions. His breathing is much improved.  No chest pain    Past Medical History  Diagnosis Date  . Hyperlipidemia   . HTN (hypertension)   . Allergic rhinitis   . Prostate hypertrophy   . Anxiety   . CAD (coronary artery disease)     a. s/p 3v CABG 2010. b. NSTEMI 05/2014 in setting of VT. Cath impressions: "Recent IMI with occluded SVG to RCA. Has Right to Right collaterals and collaterals from septal and OM. EF 40%."  . TIA (transient ischemic attack)     ASPVD, S./P. right CEA, 2002, and known occluded left carotid  . CML (chronic myelocytic leukemia) ([place]) 05/09/2012  . Ischemic cardiomyopathy     a. Prior EF 36%. b. 2014: 50%. c. 05/2014: EF 35-40% by echo, 40% with inf HK by cath.  . Carotid disease, bilateral ([place])     a. Right CEA 2002; known occluded left carotid. b.  Duplex 06/2014: known occluded LICA, stable [id number] RICA s/p CEA.  . Depression   . PVC's (premature ventricular contractions)     a. PVC's with prior Holter showing PVC load of 22%.  . 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 Pneumonia 05/23/2014  . Ventricular tachycardia ([place])     a. Admitted with VT 05/2013 - EPS with inducible sustained monomorphic VT; s/p Medtronic ICD 05/21/14.  . Emphysema of lung ([place])   . NSTEMI (non-ST elevation myocardial infarction) ([place]) 05/2014  . Pleural effusion 05/2014  . Chronic systolic CHF (congestive heart failure) ([place])   . C. difficile colitis 06/16/2014  . AICD (automatic cardioverter/defibrillator) present   . Presence of permanent cardiac pacemaker   . Stroke ([place]) 01  . Shortness of breath dyspnea   . Blood dyscrasia     cmleukemia    Past Surgical History  Procedure Laterality Date  . Coronary artery bypass graft  02/2009    3 vessel  . Colonoscopy  07/2010  . Carotidectomy  2003    right side  . Back surgery    . Electrophysiologic study  05/21/14  . Ep implantable device  05/21/14    single chamber Metronic ICD  . Left heart catheterization with coronary/graft angiogram N/A 05/19/2014    Procedure: LEFT HEART CATHETERIZATION WITH Beatrix Fetters;  Surgeon: Josue Hector, MD;  Location[facility] C[facility] LAB;  Service: Cardiovascular;  Laterality: N/A;  . Electrophysiology study N/A 05/21/2014    Procedure: ELECTROPHYSIOLOGY STUDY;  Surgeon: Deboraha Sprang, MD;  Location: [facility] CATH LAB;  Service: Cardiovascular;  Laterality: N/A;  . Carotid endarterectomy Right 01  . Video assisted thoracoscopy Right 11/27/2014    Procedure: RIGHT VIDEO ASSISTED THORACOSCOPY;  Surgeon: [name], MD;  Location: [place];  Service: Thoracic;  Laterality: Right;  . Pleural effusion drainage Right 11/27/2014    Procedure: DRAINAGE OF PLEURAL EFFUSION;  Surgeon: [name], MD;  Location: [place];  Service: Thoracic;  Laterality: Right;  .  Pleural biopsy N/A 11/27/2014    Procedure: PLEURAL BIOPSY;  Surgeon: [name], MD;  Location: [place];  Service: Thoracic;  Laterality: N/A;  . Talc pleurodesis Right 11/27/2014    Procedure: Pietro Cassis;  Surgeon: [name], MD;  [facility];  Service: Thoracic;  Laterality: Right;    Current Outpatient Prescriptions  Medication Sig Dispense Refill  . aspirin 81 MG chewable tablet Chew 1 tablet (81 mg total) by mouth daily.    Marland Kitchen atorvastatin (LIPITOR) 80 MG tablet TAKE 1 TABLET EVERY DAY  AT  6:00  PM 90 tablet 1  . bosutinib (BOSULIF) 500 MG tablet Take 1 tablet (500 mg total) by mouth daily with breakfast. Take with food. 30 tablet 6  . carvedilol (COREG) 6.25 MG tablet TAKE ONE TABLET BY MOUTH TWICE DAILY 180 tablet 0  . clopidogrel (PLAVIX) 75 MG tablet Take 1 tablet (75 mg total) by mouth daily. 30 tablet 11  . diphenoxylate-atropine (LOMOTIL) 2.5-0.025 MG tablet TAKE ONE TABLET BY MOUTH 4 TIMES DAILY 90 tablet 0  . furosemide (LASIX) 20 MG tablet TAKE ONE TABLET BY MOUTH DAILY 30 tablet 11  . guaiFENesin (MUCINEX) 600 MG 12 hr tablet Take 1200 mg by mouth 2-3 times a day as needed for cough or to loosen phlegm    . losartan (COZAAR) 25 MG tablet Take 1 tablet (25 mg total) by mouth daily. 30 tablet 11  . nitroGLYCERIN (NITROSTAT) 0.4 MG SL tablet Place 1 tablet (0.4 mg total) under the tongue every 5 (five) minutes as needed for chest pain. 25 tablet 4  . ondansetron (ZOFRAN) 8 MG tablet Take 1 tablet (8 mg total) by mouth every 8 (eight) hours as needed for nausea or vomiting. 60 tablet 5  . potassium chloride SA (K-DUR,KLOR-CON) 20 MEQ tablet Take 20 mEq by mouth daily.     No current facility-administered medications for this visit.    Allergies  Allergen Reactions  . Norvasc [Amlodipine Besylate] Rash    rash  . Sulfa Antibiotics Hives    Hives     Review of Systems negative except from HPI and PMH  Physical Exam BP 120/68 mmHg  Pulse 64   Ht 5\' 9"  (1.753 m)  Wt 178 lb 3.2 oz (80.831 kg)  BMI 26.30 kg/m2 Well developed and well nourished in no acute distress HENT normal E scleral and icterus clear Neck Supple JVP flat; carotids brisk and full Device pocket well healed; without hematoma or erythema.  There is no tethering  B even breath sounds Regular rate and rhythm, no murmurs gallops or rub Soft with active bowel sounds No clubbing cyanosis  Edema Alert and oriented, grossly normal motor and sensory function Skin Warm and Dry   ECG demonstrates sinus rhythm at 64 22/12/41 axis XXII inferior T wave inversions  Assessment and  Plan  Ventricular tachycardia No intercurrent sustained Ventricular tachycardia some nonsustained episodes  Syncope - no recurrent   ischemic cardiomyopathy depressed LV function and  prior bypass  Implantable defibrillator-Medtronic    Without symptoms of ischemia

## 2015-08-27 NOTE — Patient Instructions (Signed)
Medication Instructions: - no changes  Labwork: - none  Procedures/Testing: - none  Follow-Up: - Remote monitoring is used to monitor your Pacemaker of ICD from home. This monitoring reduces the number of office visits required to check your device to one time per year. It allows [place] to keep an eye on the functioning of your device to ensure it is working properly. You are scheduled for a device check from home on 11/26/15. You may send your transmission at any time that day. If you have a wireless device, the transmission will be sent automatically. After your physician reviews your transmission, you will receive a postcard with your next transmission date.  - Your physician wants you to follow-up in: 1 year with Dr. Caryl Comes. You will receive a reminder letter in the mail two months in advance. If you don't receive a letter, please call our office to schedule the follow-up appointment.  Any Additional Special Instructions Will Be Listed Below (If Applicable). - none    If you need a refill on your cardiac medications before your next appointment, please call your pharmacy.

## 2015-09-11 IMAGING — CR DG CHEST 2V
2 series · 2 of 2 positions shown · non-contrast
Comparison: 12/08/2014 and 11/03/2014

CLINICAL DATA: Pleural effusion.  Shortness of breath.

EXAM:
CHEST  2 VIEW

[w chest pa]
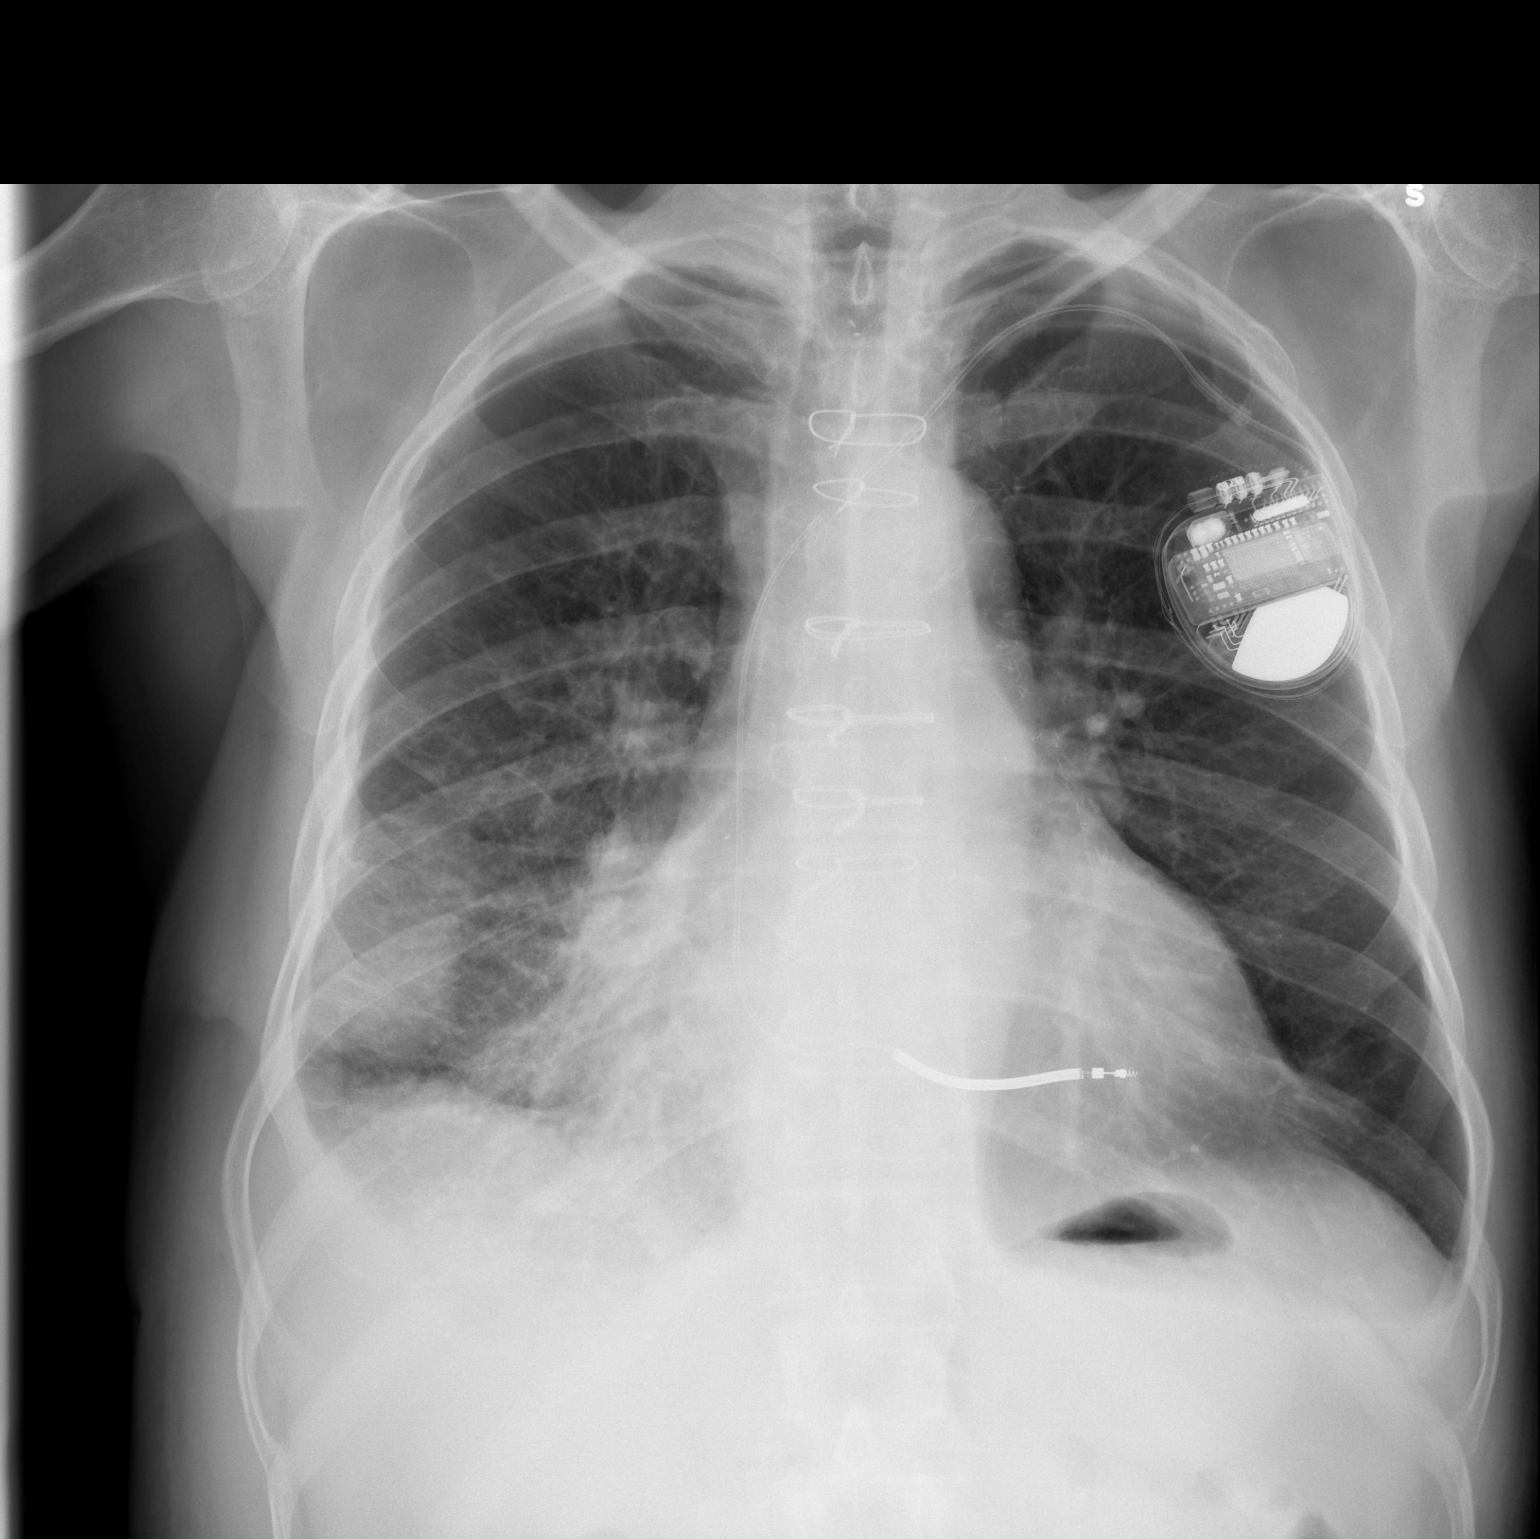

[w chest lat]
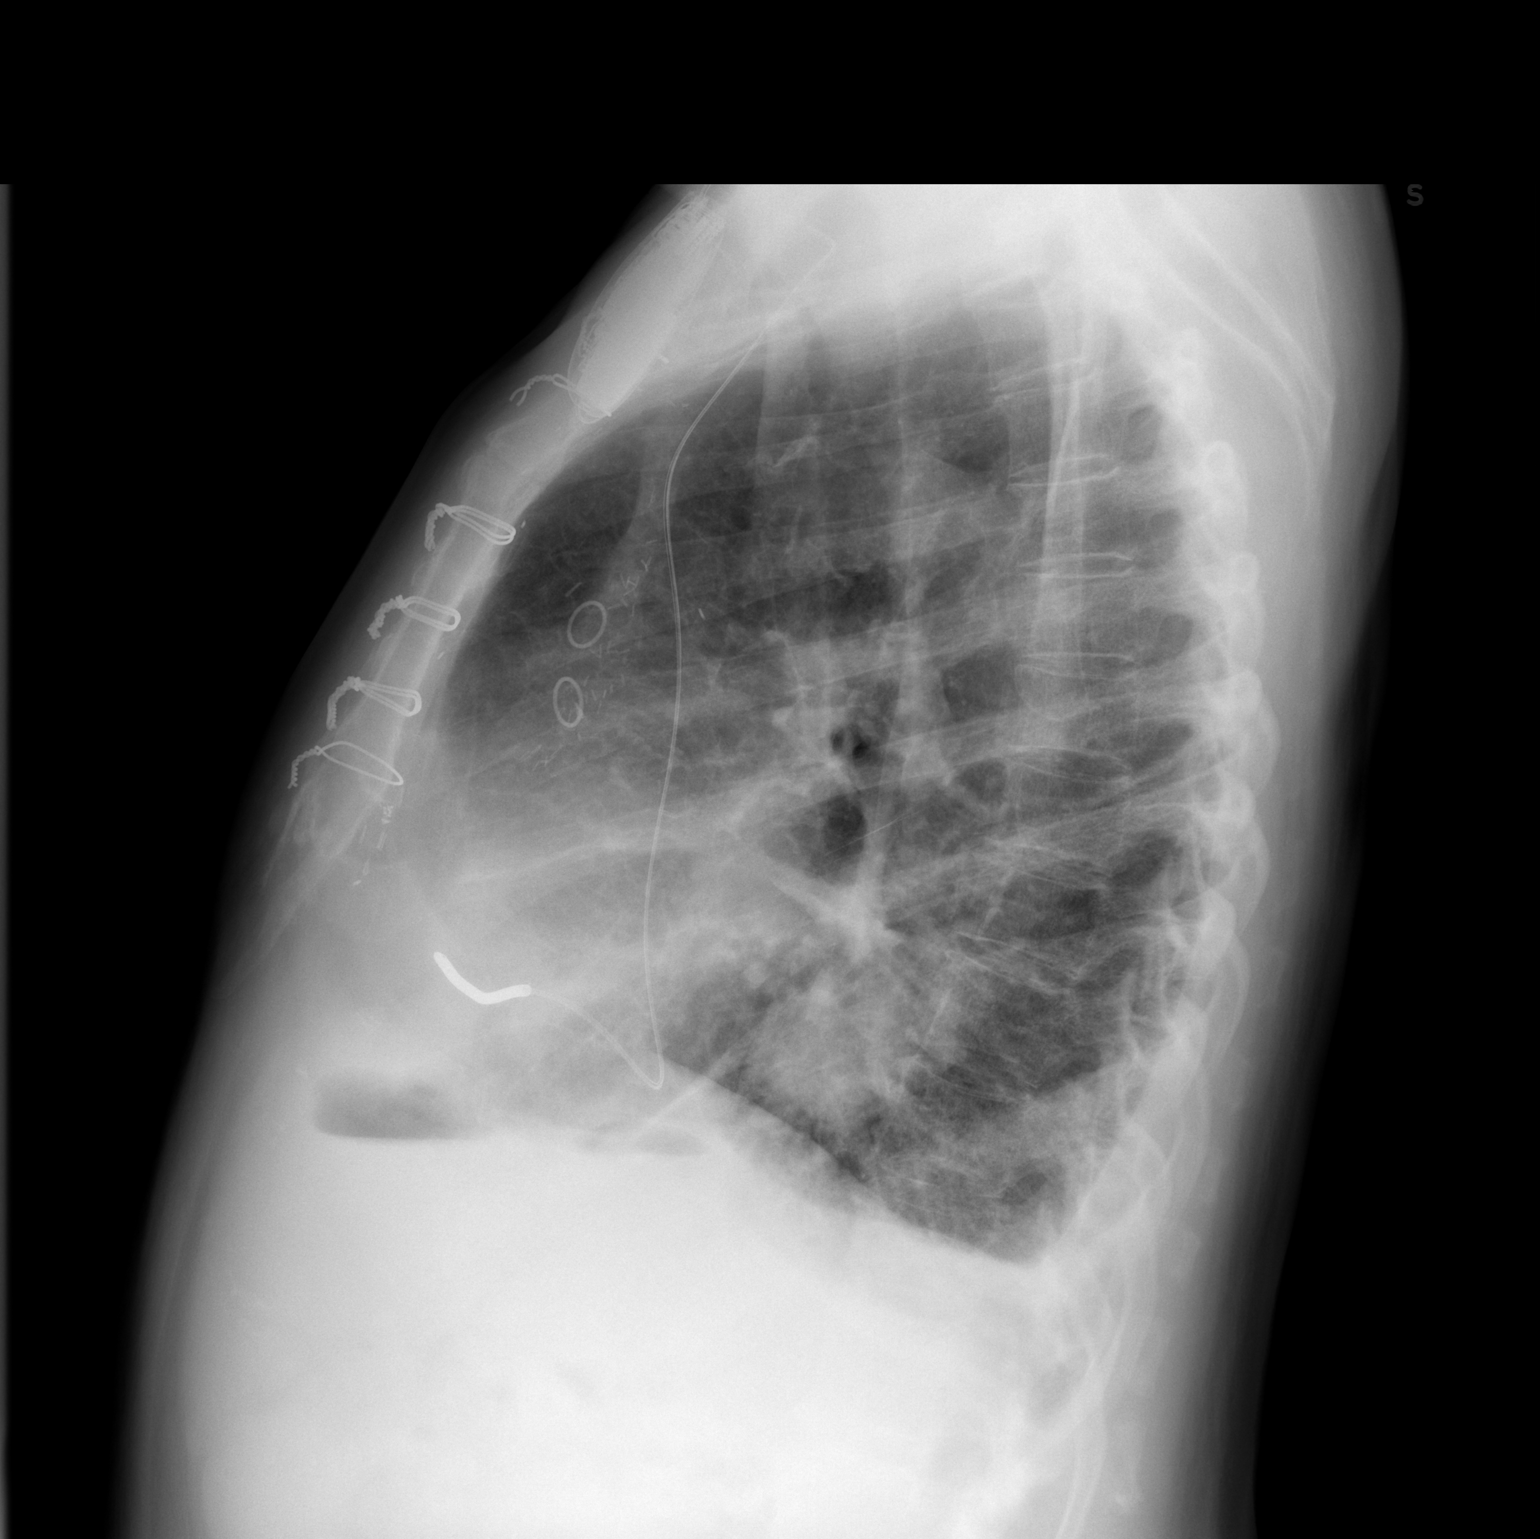

[2 of 2 positions shown; findings below may reference images not displayed]

FINDINGS: There is slight cardiomegaly. Pulmonary vascularity is normal. There
are small residual bilateral pleural effusions. Ovoid density at the
right lung base laterally probably represents fluid loculated in the
major fissure.

AICD in place.  No acute osseous abnormality.  CABG.
IMPRESSION: Small residual bilateral pleural effusions. Density at the right
base most likely represents loculated fluid in the major fissure
laterally.

## 2015-09-21 ENCOUNTER — Other Ambulatory Visit: Payer: Self-pay | Admitting: *Deleted

## 2015-09-21 ENCOUNTER — Other Ambulatory Visit: Payer: Self-pay | Admitting: Hematology and Oncology

## 2015-09-21 NOTE — Telephone Encounter (Signed)
Left Refill Lomotil on refill line at [place] at [facility].

## 2015-09-29 ENCOUNTER — Other Ambulatory Visit: Payer: Self-pay | Admitting: Cardiology

## 2015-09-29 NOTE — Telephone Encounter (Signed)
Rx request sent to pharmacy.  

## 2015-10-06 ENCOUNTER — Encounter: Payer: Self-pay | Admitting: Family Medicine

## 2015-10-06 ENCOUNTER — Ambulatory Visit (INDEPENDENT_AMBULATORY_CARE_PROVIDER_SITE_OTHER): Payer: Commercial Managed Care - HMO | Admitting: Family Medicine

## 2015-10-06 VITALS — BP 142/78 | HR 58 | Temp 98.4°F | Resp 16 | Ht 69.0 in | Wt 174.0 lb

## 2015-10-06 DIAGNOSIS — I10 Essential (primary) hypertension: Secondary | ICD-10-CM

## 2015-10-06 DIAGNOSIS — I2510 Atherosclerotic heart disease of native coronary artery without angina pectoris: Secondary | ICD-10-CM

## 2015-10-06 DIAGNOSIS — Z125 Encounter for screening for malignant neoplasm of prostate: Secondary | ICD-10-CM | POA: Diagnosis not present

## 2015-10-06 DIAGNOSIS — I255 Ischemic cardiomyopathy: Secondary | ICD-10-CM | POA: Diagnosis not present

## 2015-10-06 DIAGNOSIS — R0602 Shortness of breath: Secondary | ICD-10-CM | POA: Diagnosis not present

## 2015-10-06 LAB — LIPID PANEL
CHOLESTEROL: 89 mg/dL — AB (ref 125–200)
HDL: 30 mg/dL — ABNORMAL LOW (ref >=40)
LDL Cholesterol: 43 mg/dL (ref <130)
Total CHOL/HDL Ratio: 3 Ratio (ref <=5.0)
Triglycerides: 80 mg/dL (ref <150)
VLDL: 16 mg/dL (ref <30)

## 2015-10-06 NOTE — Progress Notes (Signed)
Subjective:    Patient ID: Anthony Wolfe, male    DOB: 1946-04-20, 70 y.o.   MRN: [id number]  HPI 02/26/15 Patient underwent VATS procedure in April for recurrent right pleural effusion. Patient states that ever since that time, he has a persistent daily cough productive of white mucus, waxing and waning shortness of breath, and chest heaviness. He has a significant past medical history including COPD. Patient quit smoking in 1999 there was smoke 3 packs of cigarettes a day for decades prior to that. Also the patient has recently had NSTEMI in October 2015. He is been on an ACE inhibitor and a beta blocker ever since. He denies any hemoptysis. He denies any weight loss. 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  He has been tested on 2 separate occasions for C. difficile colitis both tested been negative.  He has been taking Mucinex 1200 mg a day but this is no longer beneficial. Sunday patient states he felt like he could not catch his breath. He denies any chest pain. He denies any angina.  At that time, my plan was: I am concerned that the patient's symptoms sound like emphysema.  I will screen the patient with pulmonary function test. Also believe that the ACE inhibitor he is taking may be contributing to his cough. Therefore I will discontinue the ACE inhibitor and replace it with losartan 25 mg a day.  Permanent function tests reveal an FEV1 percentage of 74% with an FEV1 of 1.49 L which is 48% of predicted consistent with stage III COPD.  Begin stiolto 2 inhalations daily. Recheck in 2 weeks. Also use albuterol 2 puffs inhaled every 6 hours as needed. Recheck sooner if worse. I did give the patient some Lomotil for diarrhea  03/17/15  patient did very well for 2 weeks on stiolto  And off his ACE inhibitor. However over the last week he is started developing increasing dyspnea on exertion and continues to have a persistent daily cough  productive of white mucus.   He spoke to his oncologist this morning and they believe it could be related to his Sprycell.   After reviewing the literature on this medicine that does report up to 25%  The patient's report dyspnea on this medication and have pleural effusions. He certainly has both. However on examination today he has new left basilar crackles that were not there previously. Also he had been doing better up until last week which makes me think this is a new event exacerbating his underlying issues.  AT that time, my plan was:  while it is possible his medication is causing this I still believe he may have an element of emphysema and I am concerned he may be possibly developing left lower lobe pneumonia or possibly a new left sided pleural effusion. I will obtain a chest x-ray today to evaluate further. If there is evidence of pneumonia, I'll start the patient on antibiotics. If there is a pleural effusion I will start with diabetics. If there is a pleural effusion, he may need to consider stopping Sprycell  Because then the patient would've developed pleural effusions on numerous  Occasions for no reason other than that medication.  05/29/15 Patient ultimately was found to have recurrent pleural effusions secondary to his cancer medication. This was discontinued for 2 weeks by his oncologist, and his shortness of breath dramatically improved. Subsequently he has been started on bosulif.  Patient states that  his dyspnea has returned. He now has dyspnea with minimal exertion. He also reports by basilar pleurisy with deep inspiration. He denies any fevers or chills. He denies any chest congestion. On examination today, his lungs are clear to auscultation bilaterally with no wheezes crackles Rales. He is actually moving a good amount of air compared to his baseline. I do not appreciate any pulmonary abnormality. A stat CBC was obtained my office which shows a white blood cell count 4.7, hemoglobin of  10.5, hematocrit of 32.9. Therefore I do not believe that severe anemia as the cause of his dyspnea.  At that time, my plan was: History and exam does not provide a definitive cause of the shortness of breath. Therefore I would like the patient get a chest x-ray immediately. CBC without anemia. His shortness of breath does not appear to be angina. Congestive heart failure be another possibility. I will await the results of the chest x-ray.  10/06/15 Ultimately work up led to CT scan: 1. Multiple small loculated pleural effusions in the medial, basilar and fissural right pleural space with expected postsurgical changes from prior talc pleurodesis. A small loculated effusion in the inferior right major fissure accounts for the masslike opacity described on the recent chest radiograph. 2. Small layering left pleural effusion, increased since April 2016. 3. Two 4 mm pulmonary nodules, stable for 11 months. Follow-up chest CT is advised in 12 months for this high risk patient. This recommendation follows the consensus statement: Guidelines for Management of Small Pulmonary Nodules Detected on CT Scans: A Statement from the [facility] as published in Radiology 2005; 237:395-400. 4. Mild-to-moderate centrilobular emphysema and diffuse bronchial wall thickening, suggesting COPD. 5. Stable mild chronic mediastinal lymphadenopathy, likely benign. 6. Atherosclerosis, including left main and 3 vessel coronary artery disease status post CABG. 7. New asymmetric soft tissue posterior to the right nipple, likely to represent asymmetric gynecomastia. Clinical correlation is Advised.  Patient followed up with his cardiothoracic surgeon. The effusions were well loculated and were stable and they recommended no treatment at that time. Furthermore his breathing has only improved and at the present time he denies any problems with his breathing. He does not want to try anoro due to the fact that all the  inhaled powder seemed to irritate his lungs more and causing more problems coughing. He denies any chest pain shortness of breath or dyspnea on exertion. He is overdue for a PSA. He is also due for fasting lipid panel. He denies any myalgias or right upper quadrant pain on his statin medication. At the present time, his blood pressure is well controlled. He denies any palpitations or syncope or presyncope Past Medical History  Diagnosis Date  . Hyperlipidemia   . HTN (hypertension)   . Allergic rhinitis   . Prostate hypertrophy   . Anxiety   . CAD (coronary artery disease)     a. s/p 3v CABG 2010. b. NSTEMI 05/2014 in setting of VT. Cath impressions: "Recent IMI with occluded SVG to RCA. Has Right to Right collaterals and collaterals from septal and OM. EF 40%."  . TIA (transient ischemic attack)     ASPVD, S./P. right CEA, 2002, and known occluded left carotid  . CML (chronic myelocytic leukemia) ([place]) 05/09/2012  . Ischemic cardiomyopathy     a. Prior EF 36%. b. 2014: 50%. c. 05/2014: EF 35-40% by echo, 40% with inf HK by cath.  . Carotid disease, bilateral ([place])     a. Right CEA 2002; known occluded  left carotid. b. Duplex 06/2014: known occluded LICA, stable [id number] RICA s/p CEA.  . Depression   . PVC's (premature ventricular contractions)     a. PVC's with prior Holter showing PVC load of 22%.  . 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 Pneumonia 05/23/2014  . Ventricular tachycardia ([place])     a. Admitted with VT 05/2013 - EPS with inducible sustained monomorphic VT; s/p Medtronic ICD 05/21/14.  . Emphysema of lung ([place])   . NSTEMI (non-ST elevation myocardial infarction) ([place]) 05/2014  . Pleural effusion 05/2014  . Chronic systolic CHF (congestive heart failure) ([place])   . C. difficile colitis 06/16/2014  . AICD (automatic cardioverter/defibrillator) present   . Presence of permanent cardiac pacemaker   . Stroke ([place]) 01  . Shortness of breath dyspnea   . Blood dyscrasia      cmleukemia   Past Surgical History  Procedure Laterality Date  . Coronary artery bypass graft  02/2009    3 vessel  . Colonoscopy  07/2010  . Carotidectomy  2003    right side  . Back surgery    . Electrophysiologic study  05/21/14  . Ep implantable device  05/21/14    single chamber Metronic ICD  . Left heart catheterization with coronary/graft angiogram N/A 05/19/2014    Procedure: LEFT HEART CATHETERIZATION WITH Beatrix Fetters;  Surgeon: Josue Hector, MD;  Location: [facility] CATH LAB;  Service: Cardiovascular;  Laterality: N/A;  . Electrophysiology study N/A 05/21/2014    Procedure: ELECTROPHYSIOLOGY STUDY;  Surgeon: Deboraha Sprang, MD;  Location: [facility] CATH LAB;  Service: Cardiovascular;  Laterality: N/A;  . Carotid endarterectomy Right 01  . Video assisted thoracoscopy Right 11/27/2014    Procedure: RIGHT VIDEO ASSISTED THORACOSCOPY;  Surgeon: [name], MD;  Location: [place];  Service: Thoracic;  Laterality: Right;  . Pleural effusion drainage Right 11/27/2014    Procedure: DRAINAGE OF PLEURAL EFFUSION;  Surgeon: [name], MD;  Location: [place];  Service: Thoracic;  Laterality: Right;  . Pleural biopsy N/A 11/27/2014    Procedure: PLEURAL BIOPSY;  Surgeon: [name], MD;  Location: [place];  Service: Thoracic;  Laterality: N/A;  . Talc pleurodesis Right 11/27/2014    Procedure: Pietro Cassis;  Surgeon: [name], MD;  Location: [place];  Service: Thoracic;  Laterality: Right;   Current Outpatient Prescriptions on File Prior to Visit  Medication Sig Dispense Refill  . aspirin 81 MG chewable tablet Chew 1 tablet (81 mg total) by mouth daily.    Marland Kitchen atorvastatin (LIPITOR) 80 MG tablet TAKE 1 TABLET EVERY DAY  AT  6:00  PM 90 tablet 1  . bosutinib (BOSULIF) 500 MG tablet Take 1 tablet (500 mg total) by mouth daily with breakfast. Take with food. 30 tablet 6  . carvedilol (COREG) 6.25 MG tablet TAKE ONE TABLET BY MOUTH TWICE DAILY 180 tablet 0    . clopidogrel (PLAVIX) 75 MG tablet Take 1 tablet (75 mg total) by mouth daily. 30 tablet 11  . diphenoxylate-atropine (LOMOTIL) 2.5-0.025 MG tablet TAKE ONE TABLET BY MOUTH 4 TIMES DAILY AS NEEDED FOR DIARRHEA 90 tablet 0  . furosemide (LASIX) 20 MG tablet TAKE ONE TABLET BY MOUTH DAILY 30 tablet 11  . guaiFENesin (MUCINEX) 600 MG 12 hr tablet Take 1200 mg by mouth 2-3 times a day as needed for cough or to loosen phlegm    . losartan (COZAAR) 25 MG tablet Take 1 tablet (25 mg total) by mouth daily. 30 tablet  11  . nitroGLYCERIN (NITROSTAT) 0.4 MG SL tablet DISSOLVE 1 TABLET UNDER THE TONGUE EVERY 5 MINUTES AS NEEDED FOR CHEST PAIN 25 tablet 4  . ondansetron (ZOFRAN) 8 MG tablet Take 1 tablet (8 mg total) by mouth every 8 (eight) hours as needed for nausea or vomiting. 60 tablet 5  . potassium chloride SA (K-DUR,KLOR-CON) 20 MEQ tablet Take 20 mEq by mouth daily.     No current facility-administered medications on file prior to visit.   Allergies  Allergen Reactions  . Norvasc [Amlodipine Besylate] Rash    rash  . Sulfa Antibiotics Hives    Hives    Social History   Social History  . Marital Status: Married    Spouse Name: N/A  . Number of Children: 2  . Years of Education: N/A   Occupational History  .      retired Development worker, [employer]   Social History Main Topics  . Smoking status: Former Smoker -- 3.00 packs/day for 45 years    Types: Cigarettes    Quit date: 08/08/1997  . Smokeless tobacco: Former Systems developer  . Alcohol Use: No  . Drug Use: No  . Sexual Activity: Not on file   Other Topics Concern  . Not on file   Social History Narrative      Review of Systems  All other systems reviewed and are negative.      Objective:   Physical Exam  Constitutional: He appears well-developed and well-nourished. No distress.  HENT:  Right Ear: External ear normal.  Left Ear: External ear normal.  Nose: Nose normal.  Mouth/Throat: Oropharynx is clear and moist. No oropharyngeal  exudate.  Eyes: Conjunctivae are normal.  Neck: Neck supple.  Cardiovascular: Normal rate, regular rhythm and normal heart sounds.   No murmur heard. Pulmonary/Chest: Effort normal. No respiratory distress. He has no wheezes. He has rales. He exhibits no tenderness.  Abdominal: Soft. Bowel sounds are normal. He exhibits no distension. There is no tenderness. There is no rebound and no guarding.  Lymphadenopathy:    He has no cervical adenopathy.  Skin: He is not diaphoretic.  Vitals reviewed.         Assessment & Plan:  Essential hypertension  Cardiomyopathy, ischemic  SOB (shortness of breath) on exertion  ASCVD (arteriosclerotic cardiovascular disease) - Plan: Lipid panel, CANCELED: CBC with Differential/Platelet, CANCELED: COMPLETE METABOLIC PANEL WITH GFR  Prostate cancer screening - Plan: PSA  His blood pressure is adequately controlled. I will make no changes in his medication at this time. I would like to check a fasting lipid panel. His goal LDL cholesterol is less than 70. His shortness of breath is multifactorial however I do believe that emphysema is playing a large part of that. At the present time he is asymptomatic. Should symptoms return, I will try the patient on stiolto with the hope that the inhaled mist would not irritate his breathing is much as the inhaled powder. I will also check a PSA while we are obtaining lab work.

## 2015-10-07 ENCOUNTER — Other Ambulatory Visit: Payer: Self-pay | Admitting: *Deleted

## 2015-10-07 DIAGNOSIS — C9210 Chronic myeloid leukemia, BCR/ABL-positive, not having achieved remission: Secondary | ICD-10-CM

## 2015-10-07 LAB — PSA: PSA: 1.34 ng/mL (ref <=4.00)

## 2015-10-07 MED ORDER — BOSUTINIB 500 MG PO TABS: 500.0000 mg | ORAL_TABLET | Freq: Every day | ORAL | Status: DC

## 2015-10-08 ENCOUNTER — Encounter: Payer: Self-pay | Admitting: Family Medicine

## 2015-10-19 ENCOUNTER — Other Ambulatory Visit: Payer: Self-pay | Admitting: Hematology and Oncology

## 2015-10-19 ENCOUNTER — Other Ambulatory Visit: Payer: Self-pay | Admitting: *Deleted

## 2015-10-19 MED ORDER — DIPHENOXYLATE-ATROPINE 2.5-0.025 MG PO TABS: 1.0000 | ORAL_TABLET | Freq: Four times a day (QID) | ORAL | Status: DC

## 2015-10-21 ENCOUNTER — Other Ambulatory Visit: Payer: Self-pay | Admitting: Family Medicine

## 2015-10-23 ENCOUNTER — Encounter: Payer: Self-pay | Admitting: Family Medicine

## 2015-10-23 ENCOUNTER — Ambulatory Visit (INDEPENDENT_AMBULATORY_CARE_PROVIDER_SITE_OTHER): Payer: Commercial Managed Care - HMO | Admitting: Family Medicine

## 2015-10-23 VITALS — BP 130/70 | HR 60 | Temp 98.5°F | Resp 14 | Ht 69.0 in | Wt 178.0 lb

## 2015-10-23 DIAGNOSIS — R0989 Other specified symptoms and signs involving the circulatory and respiratory systems: Secondary | ICD-10-CM

## 2015-10-23 DIAGNOSIS — G2581 Restless legs syndrome: Secondary | ICD-10-CM

## 2015-10-23 DIAGNOSIS — R630 Anorexia: Secondary | ICD-10-CM

## 2015-10-23 MED ORDER — MIRTAZAPINE 30 MG PO TABS: 30.0000 mg | ORAL_TABLET | Freq: Every day | ORAL | Status: DC

## 2015-10-23 NOTE — Progress Notes (Signed)
Subjective:    Patient ID: KADEYN TSUNG, male    DOB: [birth year], 70 y.o.   MRN: [id number]  HPI  05/29/15 Patient ultimately was found to have recurrent pleural effusions secondary to his cancer medication. This was discontinued for 2 weeks by his oncologist, and his shortness of breath dramatically improved. Subsequently he has been started on bosulif.  Patient states that his dyspnea has returned. He now has dyspnea with minimal exertion. He also reports by basilar pleurisy with deep inspiration. He denies any fevers or chills. He denies any chest congestion. On examination today, his lungs are clear to auscultation bilaterally with no wheezes crackles Rales. He is actually moving a good amount of air compared to his baseline. I do not appreciate any pulmonary abnormality. A stat CBC was obtained my office which shows a white blood cell count 4.7, hemoglobin of 10.5, hematocrit of 32.9. Therefore I do not believe that severe anemia as the cause of his dyspnea.  At that time, my plan was: History and exam does not provide a definitive cause of the shortness of breath. Therefore I would like the patient get a chest x-ray immediately. CBC without anemia. His shortness of breath does not appear to be angina. Congestive heart failure be another possibility. I will await the results of the chest x-ray.  10/06/15 Ultimately work up led to CT scan: 1. Multiple small loculated pleural effusions in the medial, basilar and fissural right pleural space with expected postsurgical changes from prior talc pleurodesis. A small loculated effusion in the inferior right major fissure accounts for the masslike opacity described on the recent chest radiograph. 2. Small layering left pleural effusion, increased since April 2016. 3. Two 4 mm pulmonary nodules, stable for 11 months. Follow-up chest CT is advised in 12 months for this high risk patient. This recommendation follows the consensus statement:  Guidelines for Management of Small Pulmonary Nodules Detected on CT Scans: A Statement from the [facility] as published in Radiology 2005; 237:395-400. 4. Mild-to-moderate centrilobular emphysema and diffuse bronchial wall thickening, suggesting COPD. 5. Stable mild chronic mediastinal lymphadenopathy, likely benign. 6. Atherosclerosis, including left main and 3 vessel coronary artery disease status post CABG. 7. New asymmetric soft tissue posterior to the right nipple, likely to represent asymmetric gynecomastia. Clinical correlation is Advised.  Patient followed up with his cardiothoracic surgeon. The effusions were well loculated and were stable and they recommended no treatment at that time. Furthermore his breathing has only improved and at the present time he denies any problems with his breathing. He does not want to try anoro due to the fact that all the inhaled powder seemed to irritate his lungs more and causing more problems coughing. He denies any chest pain shortness of breath or dyspnea on exertion. He is overdue for a PSA. He is also due for fasting lipid panel. He denies any myalgias or right upper quadrant pain on his statin medication. At the present time, his blood pressure is well controlled. He denies any palpitations or syncope or presyncope.  AT that time, my plan was: His blood pressure is adequately controlled. I will make no changes in his medication at this time. I would like to check a fasting lipid panel. His goal LDL cholesterol is less than 70. His shortness of breath is multifactorial however I do believe that emphysema is playing a large part of that. At the present time he is asymptomatic. Should symptoms return, I will try the patient on stiolto with  the hope that the inhaled mist would not irritate his breathing is much as the inhaled powder. I will also check a PSA while we are obtaining lab work.  10/23/15 Patient presents today complaining of poor  appetite. This is been present for several months. Ironically he has gained 4 pounds since October. He also reports nausea and diarrhea. He denies any fever. He denies any black tarry stool. He denies any blood in his stool. He denies any vomiting. He denies any abdominal pain is reports insomnia and poor sleep due to restless limb syndrome. His wife states that he will frequently will jump in bed in his legs will flail. Frequently he is asleep when this occurs Past Medical History  Diagnosis Date  . Hyperlipidemia   . HTN (hypertension)   . Allergic rhinitis   . Prostate hypertrophy   . Anxiety   . CAD (coronary artery disease)     a. s/p 3v CABG 2010. b. NSTEMI 05/2014 in setting of VT. Cath impressions: "Recent IMI with occluded SVG to RCA. Has Right to Right collaterals and collaterals from septal and OM. EF 40%."  . TIA (transient ischemic attack)     ASPVD, S./P. right CEA, 2002, and known occluded left carotid  . CML (chronic myelocytic leukemia) ([place]) 05/09/2012  . Ischemic cardiomyopathy     a. Prior EF 36%. b. 2014: 50%. c. 05/2014: EF 35-40% by echo, 40% with inf HK by cath.  . Carotid disease, bilateral ([place])     a. Right CEA 2002; known occluded left carotid. b. Duplex 06/2014: known occluded LICA, stable [id number] RICA s/p CEA.  . Depression   . PVC's (premature ventricular contractions)     a. PVC's with prior Holter showing PVC load of 22%.  . 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 Pneumonia 05/23/2014  . Ventricular tachycardia ([place])     a. Admitted with VT 05/2013 - EPS with inducible sustained monomorphic VT; s/p Medtronic ICD 05/21/14.  . Emphysema of lung ([place])   . NSTEMI (non-ST elevation myocardial infarction) ([place]) 05/2014  . Pleural effusion 05/2014  . Chronic systolic CHF (congestive heart failure) ([place])   . C. difficile colitis 06/16/2014  . AICD (automatic cardioverter/defibrillator) present   . Presence of permanent cardiac pacemaker   . Stroke ([place]) 01  .  Shortness of breath dyspnea   . Blood dyscrasia     cmleukemia   Past Surgical History  Procedure Laterality Date  . Coronary artery bypass graft  02/2009    3 vessel  . Colonoscopy  07/2010  . Carotidectomy  2003    right side  . Back surgery    . Electrophysiologic study  05/21/14  . Ep implantable device  05/21/14    single chamber Metronic ICD  . Left heart catheterization with coronary/graft angiogram N/A 05/19/2014    Procedure: LEFT HEART CATHETERIZATION WITH Beatrix Fetters;  Surgeon: Josue Hector, MD;  Location: [facility] CATH LAB;  Service: Cardiovascular;  Laterality: N/A;  . Electrophysiology study N/A 05/21/2014    Procedure: ELECTROPHYSIOLOGY STUDY;  Surgeon: Deboraha Sprang, MD;  Location: [facility] CATH LAB;  Service: Cardiovascular;  Laterality: N/A;  . Carotid endarterectomy Right 01  . Video assisted thoracoscopy Right 11/27/2014    Procedure: RIGHT VIDEO ASSISTED THORACOSCOPY;  Surgeon: [name], MD;  Location: [place];  Service: Thoracic;  Laterality: Right;  . Pleural effusion drainage Right 11/27/2014    Procedure: DRAINAGE OF PLEURAL EFFUSION;  Surgeon: [name], MD;  Location:  [facility];  Service: Thoracic;  Laterality: Right;  . Pleural biopsy N/A 11/27/2014    Procedure: PLEURAL BIOPSY;  Surgeon: [name], MD;  Location: [place];  Service: Thoracic;  Laterality: N/A;  . Talc pleurodesis Right 11/27/2014    Procedure: Pietro Cassis;  Surgeon: [name], MD;  Location: [facility];  Service: Thoracic;  Laterality: Right;   Current Outpatient Prescriptions on File Prior to Visit  Medication Sig Dispense Refill  . aspirin 81 MG chewable tablet Chew 1 tablet (81 mg total) by mouth daily.    Marland Kitchen atorvastatin (LIPITOR) 80 MG tablet TAKE ONE TABLET BY MOUTH ONCE DAILY AT  6:00  PM 90 tablet 4  . bosutinib (BOSULIF) 500 MG tablet Take 1 tablet (500 mg total) by mouth daily with breakfast. Take with food. 90 tablet 3  . carvedilol (COREG)  6.25 MG tablet TAKE ONE TABLET BY MOUTH TWICE DAILY 180 tablet 0  . clopidogrel (PLAVIX) 75 MG tablet Take 1 tablet (75 mg total) by mouth daily. 30 tablet 11  . diphenoxylate-atropine (LOMOTIL) 2.5-0.025 MG tablet Take 1 tablet by mouth 4 (four) times daily. 90 tablet 0  . furosemide (LASIX) 20 MG tablet TAKE ONE TABLET BY MOUTH DAILY 30 tablet 11  . guaiFENesin (MUCINEX) 600 MG 12 hr tablet Take 1200 mg by mouth 2-3 times a day as needed for cough or to loosen phlegm    . losartan (COZAAR) 25 MG tablet Take 1 tablet (25 mg total) by mouth daily. 30 tablet 11  . nitroGLYCERIN (NITROSTAT) 0.4 MG SL tablet DISSOLVE 1 TABLET UNDER THE TONGUE EVERY 5 MINUTES AS NEEDED FOR CHEST PAIN 25 tablet 4  . ondansetron (ZOFRAN) 8 MG tablet Take 1 tablet (8 mg total) by mouth every 8 (eight) hours as needed for nausea or vomiting. 60 tablet 5  . potassium chloride SA (K-DUR,KLOR-CON) 20 MEQ tablet Take 20 mEq by mouth daily.     No current facility-administered medications on file prior to visit.   Allergies  Allergen Reactions  . Norvasc [Amlodipine Besylate] Rash    rash  . Sulfa Antibiotics Hives    Hives    Social History   Social History  . Marital Status: Married    Spouse Name: N/A  . Number of Children: 2  . Years of Education: N/A   Occupational History  .      retired Development worker, [employer]   Social History Main Topics  . Smoking status: Former Smoker -- 3.00 packs/day for 45 years    Types: Cigarettes    Quit date: 08/08/1997  . Smokeless tobacco: Former Systems developer  . Alcohol Use: No  . Drug Use: No  . Sexual Activity: Not on file   Other Topics Concern  . Not on file   Social History Narrative      Review of Systems  All other systems reviewed and are negative.      Objective:   Physical Exam  Constitutional: He appears well-developed and well-nourished. No distress.  HENT:  Right Ear: External ear normal.  Left Ear: External ear normal.  Nose: Nose normal.  Mouth/Throat:  Oropharynx is clear and moist. No oropharyngeal exudate.  Eyes: Conjunctivae are normal.  Neck: Neck supple.  Cardiovascular: Normal rate, regular rhythm and normal heart sounds.   No murmur heard. Pulmonary/Chest: Effort normal. No respiratory distress. He has no wheezes. He has rales. He exhibits no tenderness.  Abdominal: Soft. Bowel sounds are normal. He exhibits no distension. There is no tenderness.  There is no rebound and no guarding.  Lymphadenopathy:    He has no cervical adenopathy.  Skin: He is not diaphoretic.  Vitals reviewed.         Assessment & Plan:  Poor appetite - Plan: mirtazapine (REMERON) 30 MG tablet  RLS (restless legs syndrome)  I recommended Remeron 30 mg by mouth daily at bedtime to see if this will help with his insomnia and also help stimulate his appetite. We could consider Megace but I doubt that his insurance will cover it. Sounds like the patient may have periodic limb movement of sleep disorder. We could certainly consider Mirapex or Requip but I hesitate to start more than one medication at one time. So does the patient. He would like to try Remeron first.

## 2015-10-26 NOTE — Addendum Note (Signed)
Addended by: [name] on: 10/26/2015 07:22 AM   Modules accepted: Orders

## 2015-10-26 NOTE — Progress Notes (Signed)
   Subjective:    Patient ID: Anthony Wolfe, male    DOB: [birth year], 70 y.o.   MRN: [id number]  HPI    Review of Systems     Objective:   Physical Exam  Cardiovascular:  Pulses:      Carotid pulses are on the right side with bruit.         Assessment & Plan:  Pronounced bruit on right side of neck.  Had 1-39% RICA stenosis in 2015 but this sounds much more harsh.  Needs repeat imaging asap.

## 2015-10-29 ENCOUNTER — Ambulatory Visit (HOSPITAL_COMMUNITY)
Admission: RE | Admit: 2015-10-29 | Discharge: 2015-10-29 | Disposition: A | Discharge status: Home / Self Care | Payer: Commercial Managed Care - HMO | Source: Ambulatory Visit | Attending: Family Medicine | Admitting: Family Medicine

## 2015-10-29 DIAGNOSIS — I110 Hypertensive heart disease with heart failure: Secondary | ICD-10-CM | POA: Diagnosis not present

## 2015-10-29 DIAGNOSIS — I5022 Chronic systolic (congestive) heart failure: Secondary | ICD-10-CM | POA: Insufficient documentation

## 2015-10-29 DIAGNOSIS — F419 Anxiety disorder, unspecified: Secondary | ICD-10-CM | POA: Insufficient documentation

## 2015-10-29 DIAGNOSIS — E785 Hyperlipidemia, unspecified: Secondary | ICD-10-CM | POA: Diagnosis not present

## 2015-10-29 DIAGNOSIS — I6522 Occlusion and stenosis of left carotid artery: Secondary | ICD-10-CM | POA: Insufficient documentation

## 2015-10-29 DIAGNOSIS — Z950 Presence of cardiac pacemaker: Secondary | ICD-10-CM | POA: Diagnosis not present

## 2015-10-29 DIAGNOSIS — I2510 Atherosclerotic heart disease of native coronary artery without angina pectoris: Secondary | ICD-10-CM | POA: Diagnosis not present

## 2015-10-29 DIAGNOSIS — Z95810 Presence of automatic (implantable) cardiac defibrillator: Secondary | ICD-10-CM | POA: Insufficient documentation

## 2015-10-29 DIAGNOSIS — R0989 Other specified symptoms and signs involving the circulatory and respiratory systems: Secondary | ICD-10-CM | POA: Diagnosis not present

## 2015-10-29 DIAGNOSIS — I6523 Occlusion and stenosis of bilateral carotid arteries: Secondary | ICD-10-CM | POA: Diagnosis not present

## 2015-11-06 ENCOUNTER — Ambulatory Visit (INDEPENDENT_AMBULATORY_CARE_PROVIDER_SITE_OTHER): Payer: Commercial Managed Care - HMO | Admitting: Family Medicine

## 2015-11-06 ENCOUNTER — Encounter: Payer: Self-pay | Admitting: Family Medicine

## 2015-11-06 VITALS — BP 110/58 | HR 68 | Temp 98.5°F | Resp 14 | Ht 69.0 in | Wt 162.0 lb

## 2015-11-06 DIAGNOSIS — K529 Noninfective gastroenteritis and colitis, unspecified: Secondary | ICD-10-CM

## 2015-11-06 DIAGNOSIS — R197 Diarrhea, unspecified: Secondary | ICD-10-CM | POA: Diagnosis not present

## 2015-11-06 MED ORDER — METRONIDAZOLE 500 MG PO TABS: 500.0000 mg | ORAL_TABLET | Freq: Four times a day (QID) | ORAL | Status: DC

## 2015-11-06 NOTE — Progress Notes (Signed)
Subjective:    Patient ID: Anthony Wolfe, male    DOB: 1946/04/18, 70 y.o.   MRN: [id number]  HPI Patient has had diarrhea on his cancer medication. However over the last week the diarrhea has intensified tremendously. He is having constant watery bowel movements. He denies any fevers or chills. He denies any abdominal pain. He denies any black tarry stools. He denies any blood in his stool. While I can certainly be his cancer medication he does have a history of C. difficile colitis. He's had 2 episodes of this confirmed with stool studies. The patient states that the stool smells like it did when he had Clostridium difficile. Past Medical History  Diagnosis Date  . Hyperlipidemia   . HTN (hypertension)   . Allergic rhinitis   . Prostate hypertrophy   . Anxiety   . CAD (coronary artery disease)     a. s/p 3v CABG 2010. b. NSTEMI 05/2014 in setting of VT. Cath impressions: "Recent IMI with occluded SVG to RCA. Has Right to Right collaterals and collaterals from septal and OM. EF 40%."  . TIA (transient ischemic attack)     ASPVD, S./P. right CEA, 2002, and known occluded left carotid  . 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 Ischemic cardiomyopathy     a. Prior EF 36%. b. 2014: 50%. c. 05/2014: EF 35-40% by echo, 40% with inf HK by cath.  . Carotid disease, bilateral ([place])     a. Right CEA 2002; known occluded left carotid. b. Duplex 06/2014: known occluded LICA, stable [id number] RICA s/p CEA.  . Depression   . PVC's (premature ventricular contractions)     a. PVC's with prior Holter showing PVC load of 22%.  . 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 Pneumonia 05/23/2014  . Ventricular tachycardia ([place])     a. Admitted with VT 05/2013 - EPS with inducible sustained monomorphic VT; s/p Med[employer] ICD 05/21/14.  . Emphysema of lung ([place])   . NSTEMI (non-ST elevation myocardial infarction) ([place]) 05/2014  . Pleural effusion 05/2014  . Chronic systolic CHF (congestive heart  failure) ([place])   . C. difficile colitis 06/16/2014  . AICD (automatic cardioverter/defibrillator) present   . Presence of permanent cardiac pacemaker   . Stroke ([place]) 01  . Shortness of breath dyspnea   . Blood dyscrasia     cmleukemia   Past Surgical History  Procedure Laterality Date  . Coronary artery bypass graft  02/2009    3 vessel  . Colonoscopy  07/2010  . Carotidectomy  2003    right side  . Back surgery    . Electrophysiologic study  05/21/14  . Ep implantable device  05/21/14    single chamber Metronic ICD  . Left heart catheterization with coronary/graft angiogram N/A 05/19/2014    Procedure: LEFT HEART CATHETERIZATION WITH Beatrix Fetters;  Surgeon: Josue Hector, MD;  Location: [facility] CATH LAB;  Service: Cardiovascular;  Laterality: N/A;  . Electrophysiology study N/A 05/21/2014    Procedure: ELECTROPHYSIOLOGY STUDY;  Surgeon: Deboraha Sprang, MD;  Location: [facility] CATH LAB;  Service: Cardiovascular;  Laterality: N/A;  . Carotid endarterectomy Right 01  . Video assisted thoracoscopy Right 11/27/2014    Procedure: RIGHT VIDEO ASSISTED THORACOSCOPY;  Surgeon: [name], MD;  Location: [place];  Service: Thoracic;  Laterality: Right;  . Pleural effusion drainage Right 11/27/2014    Procedure: DRAINAGE OF PLEURAL EFFUSION;  Surgeon: [name], MD;  Location: [place];  Service: Thoracic;  Laterality: Right;  . Pleural biopsy N/A 11/27/2014    Procedure: PLEURAL BIOPSY;  Surgeon: [name], MD;  Location: [place];  Service: Thoracic;  Laterality: N/A;  . Talc pleurodesis Right 11/27/2014    Procedure: Pietro Cassis;  Surgeon: [name], MD;  Location: [place];  Service: Thoracic;  Laterality: Right;   Current Outpatient Prescriptions on File Prior to Visit  Medication Sig Dispense Refill  . aspirin 81 MG chewable tablet Chew 1 tablet (81 mg total) by mouth daily.    Marland Kitchen atorvastatin (LIPITOR) 80 MG tablet TAKE ONE TABLET BY MOUTH ONCE DAILY  AT  6:00  PM 90 tablet 4  . bosutinib (BOSULIF) 500 MG tablet Take 1 tablet (500 mg total) by mouth daily with breakfast. Take with food. 90 tablet 3  . carvedilol (COREG) 6.25 MG tablet TAKE ONE TABLET BY MOUTH TWICE DAILY 180 tablet 0  . clopidogrel (PLAVIX) 75 MG tablet Take 1 tablet (75 mg total) by mouth daily. 30 tablet 11  . diphenoxylate-atropine (LOMOTIL) 2.5-0.025 MG tablet Take 1 tablet by mouth 4 (four) times daily. 90 tablet 0  . furosemide (LASIX) 20 MG tablet TAKE ONE TABLET BY MOUTH DAILY 30 tablet 11  . guaiFENesin (MUCINEX) 600 MG 12 hr tablet Take 1200 mg by mouth 2-3 times a day as needed for cough or to loosen phlegm    . losartan (COZAAR) 25 MG tablet Take 1 tablet (25 mg total) by mouth daily. 30 tablet 11  . mirtazapine (REMERON) 30 MG tablet Take 1 tablet (30 mg total) by mouth at bedtime. 30 tablet 5  . nitroGLYCERIN (NITROSTAT) 0.4 MG SL tablet DISSOLVE 1 TABLET UNDER THE TONGUE EVERY 5 MINUTES AS NEEDED FOR CHEST PAIN 25 tablet 4  . ondansetron (ZOFRAN) 8 MG tablet Take 1 tablet (8 mg total) by mouth every 8 (eight) hours as needed for nausea or vomiting. 60 tablet 5  . potassium chloride SA (K-DUR,KLOR-CON) 20 MEQ tablet Take 20 mEq by mouth daily.     No current facility-administered medications on file prior to visit.   Allergies  Allergen Reactions  . Norvasc [Amlodipine Besylate] Rash    rash  . Sulfa Antibiotics Hives    Hives    Social History   Social History  . Marital Status: Married    Spouse Name: N/A  . Number of Children: 2  . Years of Education: N/A   Occupational History  .      retired Development worker, [employer]   Social History Main Topics  . Smoking status: Former Smoker -- 3.00 packs/day for 45 years    Types: Cigarettes    Quit date: 08/08/1997  . Smokeless tobacco: Former Systems developer  . Alcohol Use: No  . Drug Use: No  . Sexual Activity: Not on file   Other Topics Concern  . Not on file   Social History Narrative      Review of Systems    All other systems reviewed and are negative.      Objective:   Physical Exam  Cardiovascular: Normal rate, regular rhythm and normal heart sounds.   Pulmonary/Chest: Effort normal and breath sounds normal. No respiratory distress. He has no wheezes. He has no rales.  Abdominal: Soft. Bowel sounds are normal. He exhibits no distension. There is no tenderness. There is no rebound and no guarding.  Vitals reviewed.         Assessment & Plan:  Severe diarrhea - Plan: metroNIDAZOLE (FLAGYL) 500 MG tablet, CBC with  Differential/Platelet, COMPLETE METABOLIC PANEL WITH GFR, Gastrointestinal Pathogen Panel PCR  Bosulif vs C.Diff  Obtain GI pathogen panel immediately. Check CBC as well as CMP. I will empirically start the patient on Flagyl 500 mg by mouth 4 times a day while awaiting the results of the GI pathogen panel. If the diarrhea is not improving, he will need to discuss with his oncologist discontinuing the medication as he has lost 16 pounds over the last 4 weeks. I believe this is evidence that the diarrhea is severe and is intolerable. Meanwhile the patient begin drinking Gatorade 4 times a day. I want him to discontinue losartan at least temporarily until we get some control over the diarrhea.

## 2015-11-07 LAB — CBC WITH DIFFERENTIAL/PLATELET
BASOS PCT: 0 % (ref 0–1)
Basophils Absolute: 0 10*3/uL (ref 0.0–0.1)
EOS ABS: 0.1 10*3/uL (ref 0.0–0.7)
Eosinophils Relative: 2 % (ref 0–5)
HEMATOCRIT: 35.3 % — AB (ref 39.0–52.0)
Hemoglobin: 11.7 g/dL — ABNORMAL LOW (ref 13.0–17.0)
Lymphocytes Relative: 17 % (ref 12–46)
Lymphs Abs: 1.2 10*3/uL (ref 0.7–4.0)
MCH: 31.4 pg (ref 26.0–34.0)
MCHC: 33.1 g/dL (ref 30.0–36.0)
MCV: 94.6 fL (ref 78.0–100.0)
MONOS PCT: 11 % (ref 3–12)
MPV: 10.4 fL (ref 8.6–12.4)
Monocytes Absolute: 0.8 10*3/uL (ref 0.1–1.0)
NEUTROS ABS: 5 10*3/uL (ref 1.7–7.7)
NEUTROS PCT: 70 % (ref 43–77)
PLATELETS: 192 10*3/uL (ref 150–400)
RBC: 3.73 MIL/uL — AB (ref 4.22–5.81)
RDW: 14.6 % (ref 11.5–15.5)
WBC: 7.2 10*3/uL (ref 4.0–10.5)

## 2015-11-07 LAB — COMPLETE METABOLIC PANEL WITH GFR
ALBUMIN: 3.6 g/dL (ref 3.6–5.1)
ALK PHOS: 85 U/L (ref 40–115)
ALT: 13 U/L (ref 9–46)
AST: 16 U/L (ref 10–35)
BILIRUBIN TOTAL: 0.4 mg/dL (ref 0.2–1.2)
BUN: 14 mg/dL (ref 7–25)
CALCIUM: 8.4 mg/dL — AB (ref 8.6–10.3)
CO2: 24 mmol/L (ref 20–31)
CREATININE: 1.1 mg/dL (ref 0.70–1.18)
Chloride: 111 mmol/L — ABNORMAL HIGH (ref 98–110)
GFR, EST AFRICAN AMERICAN: 78 mL/min (ref >=60)
GFR, Est Non African American: 67 mL/min (ref >=60)
Glucose, Bld: 102 mg/dL — ABNORMAL HIGH (ref 70–99)
Potassium: 3.7 mmol/L (ref 3.5–5.3)
Sodium: 142 mmol/L (ref 135–146)
TOTAL PROTEIN: 6.2 g/dL (ref 6.1–8.1)

## 2015-11-09 ENCOUNTER — Telehealth: Payer: Self-pay | Admitting: *Deleted

## 2015-11-09 ENCOUNTER — Other Ambulatory Visit: Payer: Commercial Managed Care - HMO

## 2015-11-09 ENCOUNTER — Other Ambulatory Visit (HOSPITAL_BASED_OUTPATIENT_CLINIC_OR_DEPARTMENT_OTHER): Payer: Commercial Managed Care - HMO

## 2015-11-09 DIAGNOSIS — K529 Noninfective gastroenteritis and colitis, unspecified: Secondary | ICD-10-CM | POA: Diagnosis not present

## 2015-11-09 DIAGNOSIS — R197 Diarrhea, unspecified: Secondary | ICD-10-CM | POA: Diagnosis not present

## 2015-11-09 DIAGNOSIS — C9210 Chronic myeloid leukemia, BCR/ABL-positive, not having achieved remission: Secondary | ICD-10-CM | POA: Diagnosis not present

## 2015-11-09 LAB — COMPREHENSIVE METABOLIC PANEL
ALT: 14 U/L (ref 0–55)
ANION GAP: 7 meq/L (ref 3–11)
AST: 20 U/L (ref 5–34)
Albumin: 3.1 g/dL — ABNORMAL LOW (ref 3.5–5.0)
Alkaline Phosphatase: 76 U/L (ref 40–150)
BUN: 12.2 mg/dL (ref 7.0–26.0)
CHLORIDE: 112 meq/L — AB (ref 98–109)
CO2: 24 meq/L (ref 22–29)
Calcium: 8.4 mg/dL (ref 8.4–10.4)
Creatinine: 1.1 mg/dL (ref 0.7–1.3)
EGFR: 68 mL/min/{1.73_m2} — AB (ref >90)
GLUCOSE: 120 mg/dL (ref 70–140)
Potassium: 3.6 mEq/L (ref 3.5–5.1)
SODIUM: 144 meq/L (ref 136–145)
Total Bilirubin: <0.3 mg/dL (ref 0.20–1.20)
Total Protein: 6.1 g/dL — ABNORMAL LOW (ref 6.4–8.3)

## 2015-11-09 LAB — CBC WITH DIFFERENTIAL/PLATELET
BASO%: 0.8 % (ref 0.0–2.0)
BASOS ABS: 0.1 10*3/uL (ref 0.0–0.1)
EOS ABS: 0.2 10*3/uL (ref 0.0–0.5)
EOS%: 2.4 % (ref 0.0–7.0)
HEMATOCRIT: 33.9 % — AB (ref 38.4–49.9)
HEMOGLOBIN: 11.2 g/dL — AB (ref 13.0–17.1)
LYMPH#: 0.8 10*3/uL — AB (ref 0.9–3.3)
LYMPH%: 12.7 % — ABNORMAL LOW (ref 14.0–49.0)
MCH: 30.7 pg (ref 27.2–33.4)
MCHC: 33.1 g/dL (ref 32.0–36.0)
MCV: 92.8 fL (ref 79.3–98.0)
MONO#: 0.6 10*3/uL (ref 0.1–0.9)
MONO%: 8.4 % (ref 0.0–14.0)
NEUT#: 5 10*3/uL (ref 1.5–6.5)
NEUT%: 75.7 % — ABNORMAL HIGH (ref 39.0–75.0)
PLATELETS: 179 10*3/uL (ref 140–400)
RBC: 3.65 10*6/uL — ABNORMAL LOW (ref 4.20–5.82)
RDW: 15 % — AB (ref 11.0–14.6)
WBC: 6.5 10*3/uL (ref 4.0–10.3)

## 2015-11-09 NOTE — Telephone Encounter (Signed)
Submitted [employer] referral thru acuity connect for authorization on 11/06/15 to Dr. Alvy Bimler, MD with authorization [phone]  Requesting provider: Flonnie Hailstone  Treating provider: Jana Hakim  Number of visits:6  Start Date: 11/17/15  End Date: 05/15/16  Dx: D63.0- anemia in neoplastic disease       I25.5- Ischemic cardiomyopathy      J94.8- other specified pleural conditions

## 2015-11-10 LAB — GASTROINTESTINAL PATHOGEN PANEL PCR
C. difficile Tox A/B, PCR: NEGATIVE
Campylobacter, PCR: NEGATIVE
Cryptosporidium, PCR: NEGATIVE
E COLI (ETEC) LT/ST, PCR: NEGATIVE
E COLI (STEC) STX1/STX2, PCR: NEGATIVE
E coli 0157, PCR: NEGATIVE
Giardia lamblia, PCR: NEGATIVE
NOROVIRUS, PCR: POSITIVE — AB
ROTAVIRUS, PCR: NEGATIVE
SALMONELLA, PCR: NEGATIVE
SHIGELLA, PCR: NEGATIVE

## 2015-11-16 ENCOUNTER — Other Ambulatory Visit: Payer: Self-pay | Admitting: Hematology and Oncology

## 2015-11-17 ENCOUNTER — Encounter: Payer: Self-pay | Admitting: Hematology and Oncology

## 2015-11-17 ENCOUNTER — Telehealth: Payer: Self-pay | Admitting: Hematology and Oncology

## 2015-11-17 ENCOUNTER — Ambulatory Visit (HOSPITAL_BASED_OUTPATIENT_CLINIC_OR_DEPARTMENT_OTHER): Payer: Commercial Managed Care - HMO | Admitting: Hematology and Oncology

## 2015-11-17 VITALS — BP 150/70 | HR 87 | Temp 98.5°F | Resp 18 | Wt 172.1 lb

## 2015-11-17 DIAGNOSIS — E441 Mild protein-calorie malnutrition: Secondary | ICD-10-CM | POA: Diagnosis not present

## 2015-11-17 DIAGNOSIS — C9210 Chronic myeloid leukemia, BCR/ABL-positive, not having achieved remission: Secondary | ICD-10-CM

## 2015-11-17 DIAGNOSIS — D6481 Anemia due to antineoplastic chemotherapy: Secondary | ICD-10-CM | POA: Diagnosis not present

## 2015-11-17 DIAGNOSIS — K521 Toxic gastroenteritis and colitis: Secondary | ICD-10-CM

## 2015-11-17 DIAGNOSIS — T451X5A Adverse effect of antineoplastic and immunosuppressive drugs, initial encounter: Secondary | ICD-10-CM

## 2015-11-17 NOTE — Telephone Encounter (Signed)
Gave and printeda ppt sched and avs for pt for JULy °

## 2015-11-17 NOTE — Assessment & Plan Note (Signed)
He tolerated treatment poorly with severe diarrhea. We will resume treatment with 50% dose adjustment. The patient is delighted to know that he has finally achieved major molecular response. I will see him back in 3 months for repeat blood work, history and physical exam.

## 2015-11-17 NOTE — Assessment & Plan Note (Signed)
He had recent weight loss due to diarrhea from treatment. As above, we will resume treatment at 50% dose. The patient will try to increase oral diet as tolerated

## 2015-11-17 NOTE — Progress Notes (Signed)
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 BCR/ABL by PCR detected abnormalitis with b2a2 & b3a2 subtypes   05/09/2012 - 03/16/2015 Chemotherapy He was started on treatment with Dasatinib 100 mg daily   06/06/2012 Adverse Reaction Dose of medication was reduced to 50 mg daily due to pancytopenia   01/24/2013 Progression Patient was noted to have elevated blood count which and was subsequently found to be noncompliant to treatment. The patient has not been on treatment for several months because his prescription ran out. He was restarted back on treatment   01/16/2014 Progression Bcr/ABL by PCR is worse. Dose of Dasatinib was increased to 100 mg.   02/26/2014 Tumor Marker Blood work for ABL kinase mutation was negative.   10/29/2014 Tumor Marker BCR/ABL b2a2 & b3a2 0.29%, IS 0.1624%, not in MMR yet but improving   11/05/2014 Adverse Reaction He had thoracentesis due to pleural effusion   11/27/2014 Surgery He underwent right video-assisted thoracoscopy, Drainage of pleural effusion, Pleural biopsy, Diaphragm biopsy, Lung biopsy & Talc pleurodesis   01/30/2015 Tumor Marker BCR/ABL b2a2 & b3a2 0.78%, IS 0.4368%, not in MMR    03/11/2015 Pathology Results BCR/ABL b2a2 0.19%, IS 0.1064%, not in MMR    03/16/2015 Adverse Reaction Dasatinib was stopped due to recurrent pleural effusion   03/28/2015 -  Chemotherapy He started on Bosutinib   03/30/2015 Procedure He has  therapeutic thoracentesis of the right lung with 1 liter of fluid removed   03/30/2015 Adverse Reaction Bosutinib is placed on hold, to be restart on 8/25 at 250 mg due to severe diarrhea   04/28/2015 Tumor Marker BCR/ABL b2a2 0.06%, IS 0.0336%, in MMR    06/10/2015 Tumor Marker BCR/ABL b2a2 0.14%, IS 0.1162%, not in MMR    08/14/2015 Pathology Results BCR/ABL b2a2 0.02%, IS 0.0166%, In MMR    11/09/2015 Tumor Marker BCR/ABL b2a2 0.005%, IS 0.0042%, In MMR     INTERVAL HISTORY: Please see below for problem oriented charting. He has severe diarrhea and stopped treatment. Lost a lot of weight because of the diarrhea. Symptoms are slowly improving. No recent infection.  REVIEW OF SYSTEMS:   Constitutional: Denies fevers, chills or abnormal weight loss Eyes: Denies blurriness of vision Ears, nose, mouth, throat, and face: Denies mucositis or sore throat Respiratory: Denies cough, dyspnea or wheezes Cardiovascular: Denies palpitation, chest discomfort or lower extremity swelling Skin: Denies abnormal skin rashes Lymphatics: Denies new lymphadenopathy or easy bruising Neurological:Denies numbness, tingling or new weaknesses Behavioral/Psych: Mood is stable, no new changes  All other systems were reviewed with the patient and are negative.  I have reviewed the past medical history, past surgical history, social history and family history with the patient and they are unchanged from previous note.  ALLERGIES:  is allergic to norvasc and sulfa antibiotics.  MEDICATIONS:  Current Outpatient Prescriptions  Medication Sig Dispense Refill  . aspirin 81 MG chewable tablet Chew 1 tablet (81 mg total) by mouth daily.    Marland Kitchen atorvastatin (LIPITOR) 80 MG tablet TAKE ONE TABLET BY  MOUTH ONCE DAILY AT  6:00  PM 90 tablet 4  . carvedilol (COREG) 6.25 MG tablet TAKE ONE TABLET BY MOUTH TWICE DAILY 180 tablet 0  . clopidogrel (PLAVIX) 75 MG tablet Take 1 tablet (75 mg total) by mouth daily. 30 tablet 11  .  diphenoxylate-atropine (LOMOTIL) 2.5-0.025 MG tablet TAKE ONE TABLET BY MOUTH 4 TIMES DAILY 90 tablet 0  . furosemide (LASIX) 20 MG tablet TAKE ONE TABLET BY MOUTH DAILY 30 tablet 11  . guaiFENesin (MUCINEX) 600 MG 12 hr tablet Take 1200 mg by mouth 2-3 times a day as needed for cough or to loosen phlegm    . losartan (COZAAR) 25 MG tablet Take 1 tablet (25 mg total) by mouth daily. 30 tablet 11  . nitroGLYCERIN (NITROSTAT) 0.4 MG SL tablet DISSOLVE 1 TABLET UNDER THE TONGUE EVERY 5 MINUTES AS NEEDED FOR CHEST PAIN 25 tablet 4  . ondansetron (ZOFRAN) 8 MG tablet TAKE ONE TABLET BY MOUTH EVERY 8 HOURS AS NEEDED FOR NAUSEA OR VOMITING 60 tablet 0  . potassium chloride SA (K-DUR,KLOR-CON) 20 MEQ tablet Take 20 mEq by mouth daily.    . bosutinib (BOSULIF) 500 MG tablet Take 1 tablet (500 mg total) by mouth daily with breakfast. Take with food. (Patient not taking: Reported on 11/17/2015) 90 tablet 3   No current facility-administered medications for this visit.    PHYSICAL EXAMINATION: ECOG PERFORMANCE STATUS: 1 - Symptomatic but completely ambulatory  Filed Vitals:   11/17/15 0831  BP: 150/70  Pulse: 87  Temp: 98.5 F (36.9 C)  Resp: 18   Filed Weights   11/17/15 0831  Weight: 172 lb 1.6 oz (78.064 kg)    GENERAL:alert, no distress and comfortable SKIN: skin color, texture, turgor are normal, no rashes or significant lesions EYES: normal, Conjunctiva are pink and non-injected, sclera clear OROPHARYNX:no exudate, no erythema and lips, buccal mucosa, and tongue normal  NECK: supple, thyroid normal size, non-tender, without nodularity LYMPH:  no palpable lymphadenopathy in the cervical, axillary or inguinal LUNGS: clear to auscultation and percussion with normal breathing effort HEART: regular rate & rhythm and no murmurs and no lower extremity edema ABDOMEN:abdomen soft, non-tender and normal bowel sounds Musculoskeletal:no cyanosis of digits and no clubbing  NEURO: alert & oriented x  3 with fluent speech, no focal motor/sensory deficits  LABORATORY DATA:  I have reviewed the data as listed    Component Value Date/Time   NA 144 11/09/2015 0820   NA 142 11/06/2015 1555   K 3.6 11/09/2015 0820   K 3.7 11/06/2015 1555   CL 111* 11/06/2015 1555   CL 110* 11/14/2012 0918   CO2 24 11/09/2015 0820   CO2 24 11/06/2015 1555   GLUCOSE 120 11/09/2015 0820   GLUCOSE 102* 11/06/2015 1555   GLUCOSE 108* 11/14/2012 0918   BUN 12.2 11/09/2015 0820   BUN 14 11/06/2015 1555   CREATININE 1.1 11/09/2015 0820   CREATININE 1.10 11/06/2015 1555   CREATININE 1.16 12/06/2014 0442   CALCIUM 8.4 11/09/2015 0820   CALCIUM 8.4* 11/06/2015 1555   PROT 6.1* 11/09/2015 0820   PROT 6.2 11/06/2015 1555   ALBUMIN 3.1* 11/09/2015 0820   ALBUMIN 3.6 11/06/2015 1555   AST 20 11/09/2015 0820   AST 16 11/06/2015 1555   ALT 14 11/09/2015 0820   ALT 13 11/06/2015 1555   ALKPHOS 76 11/09/2015 0820   ALKPHOS 85 11/06/2015 1555   BILITOT <0.30 11/09/2015 0820   BILITOT 0.4 11/06/2015 1555   GFRNONAA 67 11/06/2015  [place]* 12/06/2014 0442   GFRAA 78 11/06/2015 1555   GFRAA 72* 12/06/2014 0442    No results found for: SPEP, UPEP  Lab Results  Component Value Date   WBC 6.5 11/09/2015   NEUTROABS 5.0 11/09/2015   HGB 11.2* 11/09/2015   HCT 33.9* 11/09/2015   MCV 92.8 11/09/2015   PLT 179 11/09/2015      Chemistry      Component Value Date/Time   NA 144 11/09/2015 0820   NA 142 11/06/2015 1555   K 3.6 11/09/2015 0820   K 3.7 11/06/2015 1555   CL 111* 11/06/2015 1555   CL 110* 11/14/2012 0918   CO2 24 11/09/2015 0820   CO2 24 11/06/2015 1555   BUN 12.2 11/09/2015 0820   BUN 14 11/06/2015 1555   CREATININE 1.1 11/09/2015 0820   CREATININE 1.10 11/06/2015 1555   CREATININE 1.16 12/06/2014 0442      Component Value Date/Time   CALCIUM 8.4 11/09/2015 0820   CALCIUM 8.4* 11/06/2015 1555   ALKPHOS 76 11/09/2015 0820   ALKPHOS 85 11/06/2015 1555   AST 20 11/09/2015  0820   AST 16 11/06/2015 1555   ALT 14 11/09/2015 0820   ALT 13 11/06/2015 1555   BILITOT <0.30 11/09/2015 0820   BILITOT 0.4 11/06/2015 1555     ASSESSMENT & PLAN:  Chronic myelogenous leukemia He tolerated treatment poorly with severe diarrhea. We will resume treatment with 50% dose adjustment. The patient is delighted to know that he has finally achieved major molecular response. I will see him back in 3 months for repeat blood work, history and physical exam.  Diarrhea due to drug  He has significant diarrhea in the past causing significant interruption of his treatment. I recommend continues to take Imodium or Lomotil as needed to control his diarrhea and so far he is doing well  Anemia due to antineoplastic chemotherapy This is likely due to recent treatment. The patient denies recent history of bleeding such as epistaxis, hematuria or hematochezia. He is asymptomatic from the anemia. I will observe for now. I plan to reduce the dose treatment as above.  Protein-calorie malnutrition, mild ([place]) He had recent weight loss due to diarrhea from treatment. As above, we will resume treatment at 50% dose. The patient will try to increase oral diet as tolerated   Orders Placed This Encounter  Procedures  . CBC with Differential/Platelet    Standing Status: Future     Number of Occurrences:      Standing Expiration Date: 12/21/2016  . Comprehensive metabolic panel    Standing Status: Future     Number of Occurrences:      Standing Expiration Date: 12/21/2016  . BCR-ABL    With RT-PCR technique    Standing Status: Future     Number of Occurrences:      Standing Expiration Date: 12/21/2016   All questions were answered. The patient knows to call the clinic with any problems, questions or concerns. No barriers to learning was detected. I spent 15 minutes counseling the patient face to face. The total time spent in the appointment was 20 minutes and more than 50% was on counseling  and review of test results     [facility], [place], MD 11/17/2015 8:55 AM

## 2015-11-17 NOTE — Assessment & Plan Note (Signed)
This is likely due to recent treatment. The patient denies recent history of bleeding such as epistaxis, hematuria or hematochezia. He is asymptomatic from the anemia. I will observe for now. I plan to reduce the dose treatment as above.

## 2015-11-17 NOTE — Assessment & Plan Note (Signed)
He has significant diarrhea in the past causing significant interruption of his treatment. I recommend continues to take Imodium or Lomotil as needed to control his diarrhea and so far he is doing well 

## 2015-11-19 ENCOUNTER — Other Ambulatory Visit: Payer: Self-pay | Admitting: Family Medicine

## 2015-11-19 NOTE — Telephone Encounter (Signed)
Medication refilled per protocol. 

## 2015-11-26 ENCOUNTER — Telehealth: Payer: Self-pay | Admitting: Cardiology

## 2015-11-26 ENCOUNTER — Ambulatory Visit (INDEPENDENT_AMBULATORY_CARE_PROVIDER_SITE_OTHER): Payer: Commercial Managed Care - HMO | Admitting: *Deleted

## 2015-11-26 DIAGNOSIS — Z95810 Presence of automatic (implantable) cardiac defibrillator: Secondary | ICD-10-CM | POA: Diagnosis not present

## 2015-11-26 DIAGNOSIS — I472 Ventricular tachycardia, unspecified: Secondary | ICD-10-CM

## 2015-11-26 DIAGNOSIS — I255 Ischemic cardiomyopathy: Secondary | ICD-10-CM | POA: Diagnosis not present

## 2015-11-26 NOTE — Progress Notes (Signed)
Remote ICD transmission.   

## 2015-11-26 NOTE — Telephone Encounter (Signed)
Spoke with pt and reminded pt of remote transmission that is due today. Pt verbalized understanding.   

## 2015-12-16 ENCOUNTER — Other Ambulatory Visit: Payer: Self-pay | Admitting: Hematology and Oncology

## 2015-12-18 ENCOUNTER — Telehealth: Payer: Self-pay | Admitting: *Deleted

## 2015-12-18 MED ORDER — DIPHENOXYLATE-ATROPINE 2.5-0.025 MG PO TABS: 1.0000 | ORAL_TABLET | Freq: Four times a day (QID) | ORAL | Status: DC | PRN

## 2015-12-18 NOTE — Telephone Encounter (Signed)
Wife requests refill on Lomotil for pt.. Informed her of Rx called into [facility].  She verbalized understanding.

## 2015-12-21 ENCOUNTER — Other Ambulatory Visit: Payer: Self-pay | Admitting: *Deleted

## 2015-12-21 MED ORDER — DIPHENOXYLATE-ATROPINE 2.5-0.025 MG PO TABS: 1.0000 | ORAL_TABLET | Freq: Four times a day (QID) | ORAL | Status: DC | PRN

## 2016-01-01 ENCOUNTER — Encounter: Payer: Self-pay | Admitting: Cardiology

## 2016-01-01 LAB — CUP PACEART REMOTE DEVICE CHECK
HighPow Impedance: 41 Ohm
Implantable Lead Implant Date: 20151014
Implantable Lead Location: 753860
Lead Channel Pacing Threshold Amplitude: 0.75 V
Lead Channel Pacing Threshold Pulse Width: 0.4 ms
Lead Channel Sensing Intrinsic Amplitude: 7.375 mV
Lead Channel Setting Pacing Amplitude: 2 V
MDC IDC MSMT BATTERY REMAINING LONGEVITY: 128 mo
MDC IDC MSMT BATTERY VOLTAGE: 3.02 V
MDC IDC MSMT LEADCHNL RV IMPEDANCE VALUE: 342 Ohm
MDC IDC MSMT LEADCHNL RV IMPEDANCE VALUE: 399 Ohm
MDC IDC MSMT LEADCHNL RV SENSING INTR AMPL: 7.375 mV
MDC IDC SESS DTM: 20170420161744
MDC IDC SET LEADCHNL RV PACING PULSEWIDTH: 0.4 ms
MDC IDC SET LEADCHNL RV SENSING SENSITIVITY: 0.3 mV
MDC IDC STAT BRADY RV PERCENT PACED: 0.01 %

## 2016-01-12 ENCOUNTER — Other Ambulatory Visit: Payer: Self-pay | Admitting: Family Medicine

## 2016-01-12 ENCOUNTER — Other Ambulatory Visit: Payer: Self-pay | Admitting: Hematology and Oncology

## 2016-01-12 ENCOUNTER — Other Ambulatory Visit: Payer: Self-pay | Admitting: *Deleted

## 2016-01-12 MED ORDER — DIPHENOXYLATE-ATROPINE 2.5-0.025 MG PO TABS: 1.0000 | ORAL_TABLET | Freq: Four times a day (QID) | ORAL | Status: DC | PRN

## 2016-01-18 ENCOUNTER — Encounter: Payer: Self-pay | Admitting: Cardiology

## 2016-01-18 ENCOUNTER — Other Ambulatory Visit: Payer: Self-pay | Admitting: *Deleted

## 2016-01-18 NOTE — Telephone Encounter (Signed)
Received another Refill request for lomotil from [place] faxed to [place] this morning..  I called in refill on 6/6.  Called [facility] and they confirm they already filled med and pt has picked it up.  Asked them please stop faxing refill requests since it has been taken care of.

## 2016-01-26 ENCOUNTER — Telehealth: Payer: Self-pay | Admitting: Family Medicine

## 2016-01-26 NOTE — Telephone Encounter (Signed)
Need update [employer] Ref to Cancer doctor has appt 02/08/16  Dr Luetta Nutting.  For leukemia.

## 2016-01-27 ENCOUNTER — Encounter: Payer: Self-pay | Admitting: Family Medicine

## 2016-01-27 NOTE — Telephone Encounter (Signed)
[employer] referral completed for Dr Alvy Bimler (Onocology)  From dates 02/08/16 - 08/06/16 for 6 visits  For DX C92.10, D64.81 and E44.1  Faxed to Dr Alvy Bimler office.

## 2016-02-08 ENCOUNTER — Other Ambulatory Visit (HOSPITAL_BASED_OUTPATIENT_CLINIC_OR_DEPARTMENT_OTHER): Payer: Commercial Managed Care - HMO

## 2016-02-08 DIAGNOSIS — C9210 Chronic myeloid leukemia, BCR/ABL-positive, not having achieved remission: Secondary | ICD-10-CM

## 2016-02-08 LAB — CBC WITH DIFFERENTIAL/PLATELET
BASO%: 1.4 % (ref 0.0–2.0)
Basophils Absolute: 0.1 10*3/uL (ref 0.0–0.1)
EOS%: 2.2 % (ref 0.0–7.0)
Eosinophils Absolute: 0.1 10*3/uL (ref 0.0–0.5)
HCT: 32.9 % — ABNORMAL LOW (ref 38.4–49.9)
HGB: 10.9 g/dL — ABNORMAL LOW (ref 13.0–17.1)
LYMPH%: 15.3 % (ref 14.0–49.0)
MCH: 30.6 pg (ref 27.2–33.4)
MCHC: 33 g/dL (ref 32.0–36.0)
MCV: 92.7 fL (ref 79.3–98.0)
MONO#: 0.4 10*3/uL (ref 0.1–0.9)
MONO%: 7.2 % (ref 0.0–14.0)
NEUT#: 4.1 10*3/uL (ref 1.5–6.5)
NEUT%: 73.9 % (ref 39.0–75.0)
Platelets: 163 10*3/uL (ref 140–400)
RBC: 3.54 10*6/uL — AB (ref 4.20–5.82)
RDW: 15.2 % — ABNORMAL HIGH (ref 11.0–14.6)
WBC: 5.5 10*3/uL (ref 4.0–10.3)
lymph#: 0.8 10*3/uL — ABNORMAL LOW (ref 0.9–3.3)

## 2016-02-08 LAB — COMPREHENSIVE METABOLIC PANEL
ALT: 15 U/L (ref 0–55)
AST: 16 U/L (ref 5–34)
Albumin: 3.5 g/dL (ref 3.5–5.0)
Alkaline Phosphatase: 84 U/L (ref 40–150)
Anion Gap: 8 mEq/L (ref 3–11)
BUN: 13.7 mg/dL (ref 7.0–26.0)
CHLORIDE: 110 meq/L — AB (ref 98–109)
CO2: 25 meq/L (ref 22–29)
CREATININE: 1.1 mg/dL (ref 0.7–1.3)
Calcium: 8.8 mg/dL (ref 8.4–10.4)
EGFR: 70 mL/min/{1.73_m2} — ABNORMAL LOW (ref >90)
Glucose: 130 mg/dl (ref 70–140)
Potassium: 4 mEq/L (ref 3.5–5.1)
SODIUM: 143 meq/L (ref 136–145)
Total Bilirubin: 0.37 mg/dL (ref 0.20–1.20)
Total Protein: 6.7 g/dL (ref 6.4–8.3)

## 2016-02-15 ENCOUNTER — Telehealth: Payer: Self-pay | Admitting: *Deleted

## 2016-02-15 NOTE — Telephone Encounter (Signed)
Asked pt to come in earlier tomorrow morning for MD visit if he can (due to overbooked).  Pt agreed and states no problem to come in at 8 am instead of 9 am as scheduled).

## 2016-02-16 ENCOUNTER — Telehealth: Payer: Self-pay | Admitting: Hematology and Oncology

## 2016-02-16 ENCOUNTER — Encounter: Payer: Self-pay | Admitting: Hematology and Oncology

## 2016-02-16 ENCOUNTER — Ambulatory Visit (HOSPITAL_BASED_OUTPATIENT_CLINIC_OR_DEPARTMENT_OTHER): Payer: Commercial Managed Care - HMO | Admitting: Hematology and Oncology

## 2016-02-16 VITALS — BP 146/62 | HR 72 | Temp 98.2°F | Resp 18 | Ht 69.0 in | Wt 180.3 lb

## 2016-02-16 DIAGNOSIS — C9210 Chronic myeloid leukemia, BCR/ABL-positive, not having achieved remission: Secondary | ICD-10-CM | POA: Diagnosis not present

## 2016-02-16 DIAGNOSIS — D6481 Anemia due to antineoplastic chemotherapy: Secondary | ICD-10-CM

## 2016-02-16 DIAGNOSIS — T451X5A Adverse effect of antineoplastic and immunosuppressive drugs, initial encounter: Secondary | ICD-10-CM

## 2016-02-16 NOTE — Telephone Encounter (Signed)
Gave patient avs report and appointments for November.  °

## 2016-02-16 NOTE — Assessment & Plan Note (Signed)
This is likely due to recent treatment. The patient denies recent history of bleeding such as epistaxis, hematuria or hematochezia. He is asymptomatic from the anemia. I will observe for now.    

## 2016-02-16 NOTE — Progress Notes (Signed)
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 BCR/ABL by PCR detected abnormalitis with b2a2 & b3a2 subtypes   05/09/2012 - 03/16/2015 Chemotherapy He was started on treatment with Dasatinib 100 mg daily   06/06/2012 Adverse Reaction Dose of medication was reduced to 50 mg daily due to pancytopenia   01/24/2013 Progression Patient was noted to have elevated blood count which and was subsequently found to be noncompliant to treatment. The patient has not been on treatment for several months because his prescription ran out. He was restarted back on treatment   01/16/2014 Progression Bcr/ABL by PCR is worse. Dose of Dasatinib was increased to 100 mg.   02/26/2014 Tumor Marker Blood work for ABL kinase mutation was negative.   10/29/2014 Tumor Marker BCR/ABL b2a2 & b3a2 0.29%, IS 0.1624%, not in MMR yet but improving   11/05/2014 Adverse Reaction He had thoracentesis due to pleural effusion   11/27/2014 Surgery He underwent right video-assisted thoracoscopy, Drainage of pleural effusion, Pleural biopsy, Diaphragm biopsy, Lung biopsy & [place] pleurodesis   01/30/2015 Tumor Marker BCR/ABL b2a2 & b3a2 0.78%, IS 0.4368%, not in MMR    03/11/2015 Pathology Results BCR/ABL b2a2 0.19%, IS 0.1064%, not in MMR    03/16/2015 Adverse Reaction Dasatinib was stopped due to recurrent pleural effusion   03/28/2015 -  Chemotherapy He started on Bosutinib   03/30/2015 Procedure He has  therapeutic thoracentesis of the right lung with 1 liter of fluid removed   03/30/2015 Adverse Reaction Bosutinib is placed on hold, to be restart on 8/25 at 250 mg due to severe diarrhea   04/28/2015 Tumor Marker BCR/ABL b2a2 0.06%, IS 0.0336%, in MMR    06/10/2015 Tumor Marker BCR/ABL b2a2 0.14%, IS 0.1162%, not in MMR    08/14/2015 Pathology Results BCR/ABL b2a2 0.02%, IS 0.0166%, In MMR    11/09/2015 Tumor Marker BCR/ABL b2a2 0.005%, IS 0.0042%, In MMR    02/08/2016 Tumor Marker BCR/ABL undetectable. In MMR    INTERVAL HISTORY: Please see below for problem oriented charting. He returns for further follow-up. He denies recent infection. Over the last 3 weeks, he denies diarrhea. He has not needed to take Lomotil recently. Denies shortness of breath or chest pain. He is gaining weight  REVIEW OF SYSTEMS:   Constitutional: Denies fevers, chills or abnormal weight loss Eyes: Denies blurriness of vision Ears, nose, mouth, throat, and face: Denies mucositis or sore throat Respiratory: Denies cough, dyspnea or wheezes Cardiovascular: Denies palpitation, chest discomfort or lower extremity swelling Gastrointestinal:  Denies nausea, heartburn or change in bowel habits Skin: Denies abnormal skin rashes Lymphatics: Denies new lymphadenopathy or easy bruising Neurological:Denies numbness, tingling or new weaknesses Behavioral/Psych: Mood is stable, no new changes  All other systems were reviewed with the patient and are negative.  I have reviewed the past medical history, past surgical history, social history and family history with the patient and they are unchanged from previous note.  ALLERGIES:  is allergic to norvasc and sulfa antibiotics.  MEDICATIONS:  Current Outpatient Prescriptions  Medication  Sig Dispense Refill  . aspirin 81 MG chewable tablet Chew 1 tablet (81 mg total) by mouth daily.    Marland Kitchen atorvastatin (LIPITOR) 80 MG tablet TAKE ONE TABLET BY MOUTH ONCE DAILY AT  6:00  PM 90  tablet 4  . bosutinib (BOSULIF) 500 MG tablet Take 1 tablet (500 mg total) by mouth daily with breakfast. Take with food. (Patient not taking: Reported on 11/17/2015) 90 tablet 3  . carvedilol (COREG) 6.25 MG tablet TAKE ONE TABLET BY MOUTH TWICE DAILY 180 tablet 1  . clopidogrel (PLAVIX) 75 MG tablet TAKE ONE TABLET BY MOUTH  DAILY 30 tablet 0  . diphenoxylate-atropine (LOMOTIL) 2.5-0.025 MG tablet Take 1 tablet by mouth 4 (four) times daily as needed for diarrhea or loose stools. 90 tablet 1  . furosemide (LASIX) 20 MG tablet TAKE ONE TABLET BY MOUTH DAILY 30 tablet 11  . guaiFENesin (MUCINEX) 600 MG 12 hr tablet Take 1200 mg by mouth 2-3 times a day as needed for cough or to loosen phlegm    . losartan (COZAAR) 25 MG tablet Take 1 tablet (25 mg total) by mouth daily. 30 tablet 11  . nitroGLYCERIN (NITROSTAT) 0.4 MG SL tablet DISSOLVE 1 TABLET UNDER THE TONGUE EVERY 5 MINUTES AS NEEDED FOR CHEST PAIN 25 tablet 4  . ondansetron (ZOFRAN) 8 MG tablet TAKE ONE TABLET BY MOUTH EVERY 8 HOURS AS NEEDED FOR  NAUSEA  OR  VOMITING 60 tablet 0  . potassium chloride SA (K-DUR,KLOR-CON) 20 MEQ tablet Take 20 mEq by mouth daily.     No current facility-administered medications for this visit.    PHYSICAL EXAMINATION: ECOG PERFORMANCE STATUS: 0 - Asymptomatic  Filed Vitals:   02/16/16 0814  BP: 146/62  Pulse: 72  Temp: 98.2 F (36.8 C)  Resp: 18   Filed Weights   02/16/16 0814  Weight: 180 lb 4.8 oz (81.784 kg)    GENERAL:alert, no distress and comfortable SKIN: skin color, texture, turgor are normal, no rashes or significant lesions EYES: normal, Conjunctiva are pink and non-injected, sclera clear OROPHARYNX:no exudate, no erythema and lips, buccal mucosa, and tongue normal  NECK: supple, thyroid normal size, non-tender, without nodularity LYMPH:  no palpable lymphadenopathy in the cervical, axillary or inguinal LUNGS: clear to auscultation and percussion with normal breathing effort HEART:  regular rate & rhythm and no murmurs and no lower extremity edema ABDOMEN:abdomen soft, non-tender and normal bowel sounds Musculoskeletal:no cyanosis of digits and no clubbing  NEURO: alert & oriented x 3 with fluent speech, no focal motor/sensory deficits  LABORATORY DATA:  I have reviewed the data as listed    Component Value Date/Time   NA 143 02/08/2016 0853   NA 142 11/06/2015 1555   K 4.0 02/08/2016 0853   K 3.7 11/06/2015 1555   CL 111* 11/06/2015 1555   CL 110* 11/14/2012 0918   CO2 25 02/08/2016 0853   CO2 24 11/06/2015 1555   GLUCOSE 130 02/08/2016 0853   GLUCOSE 102* 11/06/2015 1555   GLUCOSE 108* 11/14/2012 0918   BUN 13.7 02/08/2016 0853   BUN 14 11/06/2015 1555   CREATININE 1.1 02/08/2016 0853   CREATININE 1.10 11/06/2015 1555   CREATININE 1.16 12/06/2014 0442   CALCIUM 8.8 02/08/2016 0853   CALCIUM 8.4* 11/06/2015 1555   PROT 6.7 02/08/2016 0853   PROT 6.2 11/06/2015 1555   ALBUMIN 3.5 02/08/2016 0853   ALBUMIN 3.6 11/06/2015 1555   AST 16 02/08/2016 0853   AST 16 11/06/2015 1555   ALT  15 02/08/2016 0853   ALT 13 11/06/2015 1555   ALKPHOS 84 02/08/2016 0853   ALKPHOS 85 11/06/2015 1555   BILITOT 0.37 02/08/2016 0853   BILITOT 0.4 11/06/2015 1555   GFRNONAA 67 11/06/2015 1555   GFRNONAA 62* 12/06/2014 0442   GFRAA 78 11/06/2015 1555   GFRAA 72* 12/06/2014 0442    No results found for: SPEP, UPEP  Lab Results  Component Value Date   WBC 5.5 02/08/2016   NEUTROABS 4.1 02/08/2016   HGB 10.9* 02/08/2016   HCT 32.9* 02/08/2016   MCV 92.7 02/08/2016   PLT 163 02/08/2016      Chemistry      Component Value Date/Time   NA 143 02/08/2016 0853   NA 142 11/06/2015 1555   K 4.0 02/08/2016 0853   K 3.7 11/06/2015 1555   CL 111* 11/06/2015 1555   CL 110* 11/14/2012 0918   CO2 25 02/08/2016 0853   CO2 24 11/06/2015 1555   BUN 13.7 02/08/2016 0853   BUN 14 11/06/2015 1555   CREATININE 1.1 02/08/2016 0853   CREATININE 1.10 11/06/2015 1555    CREATININE 1.16 12/06/2014 0442      Component Value Date/Time   CALCIUM 8.8 02/08/2016 0853   CALCIUM 8.4* 11/06/2015 1555   ALKPHOS 84 02/08/2016 0853   ALKPHOS 85 11/06/2015 1555   AST 16 02/08/2016 0853   AST 16 11/06/2015 1555   ALT 15 02/08/2016 0853   ALT 13 11/06/2015 1555   BILITOT 0.37 02/08/2016 0853   BILITOT 0.4 11/06/2015 1555      ASSESSMENT & PLAN:  Chronic myelogenous leukemia He tolerated treatment poorly with severe diarrhea. Since we resumed treatment with 50% dose adjustment, he is doing well. In fact, he has no diarrhea for the last 3 weeks and has not needed to take Imodium or Lomotil. The patient is delighted to know that he has achieved major molecular response recent undetectable BCR/ABL. I will see him back in 4 months for repeat blood work, history and physical exam.  Anemia due to antineoplastic chemotherapy This is likely due to recent treatment. The patient denies recent history of bleeding such as epistaxis, hematuria or hematochezia. He is asymptomatic from the anemia. I will observe for now   Orders Placed This Encounter  Procedures  . Comprehensive metabolic panel    Standing Status: Future     Number of Occurrences:      Standing Expiration Date: 03/22/2017  . CBC with Differential/Platelet    Standing Status: Future     Number of Occurrences:      Standing Expiration Date: 03/22/2017  . BCR-ABL    With RT-PCR technique    Standing Status: Future     Number of Occurrences:      Standing Expiration Date: 03/22/2017   All questions were answered. The patient knows to call the clinic with any problems, questions or concerns. No barriers to learning was detected. I spent 15 minutes counseling the patient face to face. The total time spent in the appointment was 20 minutes and more than 50% was on counseling and review of test results     [facility], [place], MD 02/16/2016 8:54 AM

## 2016-02-16 NOTE — Assessment & Plan Note (Signed)
He tolerated treatment poorly with severe diarrhea. Since we resumed treatment with 50% dose adjustment, he is doing well. In fact, he has no diarrhea for the last 3 weeks and has not needed to take Imodium or Lomotil. The patient is delighted to know that he has achieved major molecular response recent undetectable BCR/ABL. I will see him back in 4 months for repeat blood work, history and physical exam.

## 2016-02-25 ENCOUNTER — Ambulatory Visit (INDEPENDENT_AMBULATORY_CARE_PROVIDER_SITE_OTHER): Payer: Commercial Managed Care - HMO | Admitting: *Deleted

## 2016-02-25 DIAGNOSIS — I255 Ischemic cardiomyopathy: Secondary | ICD-10-CM | POA: Diagnosis not present

## 2016-02-25 DIAGNOSIS — Z95810 Presence of automatic (implantable) cardiac defibrillator: Secondary | ICD-10-CM

## 2016-02-25 DIAGNOSIS — I472 Ventricular tachycardia, unspecified: Secondary | ICD-10-CM

## 2016-02-25 NOTE — Progress Notes (Signed)
Remote ICD transmission.   

## 2016-02-26 ENCOUNTER — Encounter: Payer: Self-pay | Admitting: Cardiology

## 2016-03-08 LAB — CUP PACEART REMOTE DEVICE CHECK
Brady Statistic RV Percent Paced: 0.01 %
Date Time Interrogation Session: 20170720103325
HIGH POWER IMPEDANCE MEASURED VALUE: 44 Ohm
Lead Channel Impedance Value: 342 Ohm
Lead Channel Impedance Value: 399 Ohm
Lead Channel Pacing Threshold Amplitude: 0.875 V
Lead Channel Sensing Intrinsic Amplitude: 7.375 mV
Lead Channel Sensing Intrinsic Amplitude: 7.375 mV
Lead Channel Setting Sensing Sensitivity: 0.3 mV
MDC IDC LEAD IMPLANT DT: 20151014
MDC IDC LEAD LOCATION: 753860
MDC IDC MSMT BATTERY REMAINING LONGEVITY: 126 mo
MDC IDC MSMT BATTERY VOLTAGE: 3.01 V
MDC IDC MSMT LEADCHNL RV PACING THRESHOLD PULSEWIDTH: 0.4 ms
MDC IDC SET LEADCHNL RV PACING AMPLITUDE: 2 V
MDC IDC SET LEADCHNL RV PACING PULSEWIDTH: 0.4 ms

## 2016-03-16 ENCOUNTER — Encounter: Payer: Self-pay | Admitting: Cardiology

## 2016-04-05 ENCOUNTER — Ambulatory Visit (INDEPENDENT_AMBULATORY_CARE_PROVIDER_SITE_OTHER): Payer: Commercial Managed Care - HMO | Admitting: Family Medicine

## 2016-04-05 ENCOUNTER — Encounter: Payer: Self-pay | Admitting: Family Medicine

## 2016-04-05 VITALS — BP 158/88 | HR 68 | Temp 98.0°F | Resp 16 | Ht 69.0 in | Wt 184.0 lb

## 2016-04-05 DIAGNOSIS — I255 Ischemic cardiomyopathy: Secondary | ICD-10-CM

## 2016-04-05 DIAGNOSIS — J90 Pleural effusion, not elsewhere classified: Secondary | ICD-10-CM

## 2016-04-05 DIAGNOSIS — I10 Essential (primary) hypertension: Secondary | ICD-10-CM

## 2016-04-05 DIAGNOSIS — I2510 Atherosclerotic heart disease of native coronary artery without angina pectoris: Secondary | ICD-10-CM | POA: Diagnosis not present

## 2016-04-05 LAB — COMPLETE METABOLIC PANEL WITH GFR
ALBUMIN: 3.8 g/dL (ref 3.6–5.1)
ALK PHOS: 93 U/L (ref 40–115)
ALT: 13 U/L (ref 9–46)
AST: 15 U/L (ref 10–35)
BILIRUBIN TOTAL: 0.4 mg/dL (ref 0.2–1.2)
BUN: 14 mg/dL (ref 7–25)
CALCIUM: 8.9 mg/dL (ref 8.6–10.3)
CHLORIDE: 108 mmol/L (ref 98–110)
CO2: 26 mmol/L (ref 20–31)
Creat: 1.09 mg/dL (ref 0.70–1.18)
GFR, EST NON AFRICAN AMERICAN: 67 mL/min (ref >=60)
GFR, Est African American: 78 mL/min (ref >=60)
Glucose, Bld: 128 mg/dL — ABNORMAL HIGH (ref 70–99)
POTASSIUM: 3.9 mmol/L (ref 3.5–5.3)
SODIUM: 142 mmol/L (ref 135–146)
Total Protein: 6.7 g/dL (ref 6.1–8.1)

## 2016-04-05 LAB — CBC WITH DIFFERENTIAL/PLATELET
BASOS ABS: 0 {cells}/uL (ref 0–200)
Basophils Relative: 0 %
EOS PCT: 2 %
Eosinophils Absolute: 118 cells/uL (ref 15–500)
HCT: 32.3 % — ABNORMAL LOW (ref 38.5–50.0)
HEMOGLOBIN: 10.6 g/dL — AB (ref 13.0–17.0)
LYMPHS ABS: 767 {cells}/uL — AB (ref 850–3900)
LYMPHS PCT: 13 %
MCH: 30.4 pg (ref 27.0–33.0)
MCHC: 32.8 g/dL (ref 32.0–36.0)
MCV: 92.6 fL (ref 80.0–100.0)
MPV: 10.4 fL (ref 7.5–12.5)
Monocytes Absolute: 413 cells/uL (ref 200–950)
Monocytes Relative: 7 %
NEUTROS PCT: 78 %
Neutro Abs: 4602 cells/uL (ref 1500–7800)
Platelets: 166 10*3/uL (ref 140–400)
RBC: 3.49 MIL/uL — AB (ref 4.20–5.80)
RDW: 15.2 % — AB (ref 11.0–15.0)
WBC: 5.9 10*3/uL (ref 3.8–10.8)

## 2016-04-05 LAB — LIPID PANEL
CHOLESTEROL: 101 mg/dL — AB (ref 125–200)
HDL: 33 mg/dL — AB (ref >=40)
LDL Cholesterol: 48 mg/dL (ref <130)
TRIGLYCERIDES: 99 mg/dL (ref <150)
Total CHOL/HDL Ratio: 3.1 Ratio (ref <=5.0)
VLDL: 20 mg/dL (ref <30)

## 2016-04-05 NOTE — Progress Notes (Signed)
Subjective:    Patient ID: Anthony Wolfe, male    DOB: Jun 02, 1946, 70 y.o.   MRN: [id number]  HPI 02/26/15 Patient underwent VATS procedure in April for recurrent right pleural effusion. Patient states that ever since that time, he has a persistent daily cough productive of white mucus, waxing and waning shortness of breath, and chest heaviness. He has a significant past medical history including COPD. Patient quit smoking in 1999 there was smoke 3 packs of cigarettes a day for decades prior to that. Also the patient has recently had NSTEMI in October 2015. He is been on an ACE inhibitor and a beta blocker ever since. He denies any hemoptysis. He denies any weight loss. 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  He has been tested on 2 separate occasions for C. difficile colitis both tested been negative.  He has been taking Mucinex 1200 mg a day but this is no longer beneficial. Sunday patient states he felt like he could not catch his breath. He denies any chest pain. He denies any angina.  At that time, my plan was: I am concerned that the patient's symptoms sound like emphysema.  I will screen the patient with pulmonary function test. Also believe that the ACE inhibitor he is taking may be contributing to his cough. Therefore I will discontinue the ACE inhibitor and replace it with losartan 25 mg a day.  Permanent function tests reveal an FEV1 percentage of 74% with an FEV1 of 1.49 L which is 48% of predicted consistent with stage III COPD.  Begin stiolto 2 inhalations daily. Recheck in 2 weeks. Also use albuterol 2 puffs inhaled every 6 hours as needed. Recheck sooner if worse. I did give the patient some Lomotil for diarrhea  03/17/15  patient did very well for 2 weeks on stiolto  And off his ACE inhibitor. However over the last week he is started developing increasing dyspnea on exertion and continues to have a persistent daily cough  productive of white mucus.   He spoke to his oncologist this morning and they believe it could be related to his Sprycell.   After reviewing the literature on this medicine that does report up to 25%  The patient's report dyspnea on this medication and have pleural effusions. He certainly has both. However on examination today he has new left basilar crackles that were not there previously. Also he had been doing better up until last week which makes me think this is a new event exacerbating his underlying issues.  AT that time, my plan was:  while it is possible his medication is causing this I still believe he may have an element of emphysema and I am concerned he may be possibly developing left lower lobe pneumonia or possibly a new left sided pleural effusion. I will obtain a chest x-ray today to evaluate further. If there is evidence of pneumonia, I'll start the patient on antibiotics. If there is a pleural effusion I will start with diabetics. If there is a pleural effusion, he may need to consider stopping Sprycell  Because then the patient would've developed pleural effusions on numerous  Occasions for no reason other than that medication.  05/29/15 Patient ultimately was found to have recurrent pleural effusions secondary to his cancer medication. This was discontinued for 2 weeks by his oncologist, and his shortness of breath dramatically improved. Subsequently he has been started on bosulif.  Patient states that  his dyspnea has returned. He now has dyspnea with minimal exertion. He also reports by basilar pleurisy with deep inspiration. He denies any fevers or chills. He denies any chest congestion. On examination today, his lungs are clear to auscultation bilaterally with no wheezes crackles Rales. He is actually moving a good amount of air compared to his baseline. I do not appreciate any pulmonary abnormality. A stat CBC was obtained my office which shows a white blood cell count 4.7, hemoglobin of  10.5, hematocrit of 32.9. Therefore I do not believe that severe anemia as the cause of his dyspnea.  At that time, my plan was: History and exam does not provide a definitive cause of the shortness of breath. Therefore I would like the patient get a chest x-ray immediately. CBC without anemia. His shortness of breath does not appear to be angina. Congestive heart failure be another possibility. I will await the results of the chest x-ray.  10/06/15 Ultimately work up led to CT scan: 1. Multiple small loculated pleural effusions in the medial, basilar and fissural right pleural space with expected postsurgical changes from prior talc pleurodesis. A small loculated effusion in the inferior right major fissure accounts for the masslike opacity described on the recent chest radiograph. 2. Small layering left pleural effusion, increased since April 2016. 3. Two 4 mm pulmonary nodules, stable for 11 months. Follow-up chest CT is advised in 12 months for this high risk patient. This recommendation follows the consensus statement: Guidelines for Management of Small Pulmonary Nodules Detected on CT Scans: A Statement from the [place] as published in Radiology 2005; 237:395-400. 4. Mild-to-moderate centrilobular emphysema and diffuse bronchial wall thickening, suggesting COPD. 5. Stable mild chronic mediastinal lymphadenopathy, likely benign. 6. Atherosclerosis, including left main and 3 vessel coronary artery disease status post CABG. 7. New asymmetric soft tissue posterior to the right nipple, likely to represent asymmetric gynecomastia. Clinical correlation is Advised.  Patient followed up with his cardiothoracic surgeon. The effusions were well loculated and were stable and they recommended no treatment at that time. Furthermore his breathing has only improved and at the present time he denies any problems with his breathing. He does not want to try anoro due to the fact that all the  inhaled powder seemed to irritate his lungs more and causing more problems coughing. He denies any chest pain shortness of breath or dyspnea on exertion. He is overdue for a PSA. He is also due for fasting lipid panel. He denies any myalgias or right upper quadrant pain on his statin medication. At the present time, his blood pressure is well controlled. He denies any palpitations or syncope or presyncope.  At that time, my plan was: His blood pressure is adequately controlled. I will make no changes in his medication at this time. I would like to check a fasting lipid panel. His goal LDL cholesterol is less than 70. His shortness of breath is multifactorial however I do believe that emphysema is playing a large part of that. At the present time he is asymptomatic. Should symptoms return, I will try the patient on stiolto with the hope that the inhaled mist would not irritate his breathing is much as the inhaled powder. I will also check a PSA while we are obtaining lab work.  04/05/16 Since I last saw the patient, he suffered severe diarrhea. At that time his oncologist decrease the dose of his Bosulif by 50% and the diarrhea completely resolved. Overall he is been doing exceptionally well  since that time. He states that his energy level has improved. He does complain of some mild dyspnea on exertion. He denies any chest pain. He denies any liver disease. He has stable right pleural effusions. On his exam today, there are faint right basilar crackles that are patient has a history of ischemic cardiomyopathy with an ejection fraction of 40%. Due to his multiple medical problems since his myocardial infarction, I do not see if this is been followed up. Patient would be a good candidate for Entresto if cardiomyopathy persist. He is also due to recheck a fasting lipid panel. He denies any myalgias or right upper quadrant pain. Past Medical History:  Diagnosis Date  . AICD (automatic cardioverter/defibrillator)  present   . Allergic rhinitis   . 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 Anxiety   . Blood dyscrasia    cmleukemia  . C. difficile colitis 06/16/2014  . CAD (coronary artery disease)    a. s/p 3v CABG 2010. b. NSTEMI 05/2014 in setting of VT. Cath impressions: "Recent IMI with occluded SVG to RCA. Has Right to Right collaterals and collaterals from septal and OM. EF 40%."  . Carotid disease, bilateral ([place])    a. Right CEA 2002; known occluded left carotid. b. Duplex 06/2014: known occluded LICA, stable [id number] RICA s/p CEA.  . Chronic systolic CHF (congestive heart failure) ([place])   . CML (chronic myelocytic leukemia) ([place]) 05/09/2012  . Depression   . Emphysema of lung ([place])   . HTN (hypertension)   . Hyperlipidemia   . Ischemic cardiomyopathy    a. Prior EF 36%. b. 2014: 50%. c. 05/2014: EF 35-40% by echo, 40% with inf HK by cath.  . NSTEMI (non-ST elevation myocardial infarction) ([place]) 05/2014  . Pleural effusion 05/2014  . Pneumonia 05/23/2014  . Presence of permanent cardiac pacemaker   . Prostate hypertrophy   . PVC's (premature ventricular contractions)    a. PVC's with prior Holter showing PVC load of 22%.  . Shortness of breath dyspnea   . Stroke ([place]) 01  . TIA (transient ischemic attack)    ASPVD, S./P. right CEA, 2002, and known occluded left carotid  . Ventricular tachycardia ([place])    a. Admitted with VT 05/2013 - EPS with inducible sustained monomorphic VT; s/p [employer] ICD 05/21/14.   Past Surgical History:  Procedure Laterality Date  . BACK SURGERY    . CAROTID ENDARTERECTOMY Right 01  . carotidectomy  2003   right side  . COLONOSCOPY  07/2010  . CORONARY ARTERY BYPASS GRAFT  02/2009   3 vessel  . ELECTROPHYSIOLOGIC STUDY  05/21/14  . ELECTROPHYSIOLOGY STUDY N/A 05/21/2014   Procedure: ELECTROPHYSIOLOGY STUDY;  Surgeon: Deboraha Sprang, MD;  Location: [facility] CATH LAB;  Service: Cardiovascular;  Laterality: N/A;  . EP IMPLANTABLE DEVICE  05/21/14   single  chamber Metronic ICD  . LEFT HEART CATHETERIZATION WITH CORONARY/GRAFT ANGIOGRAM N/A 05/19/2014   Procedure: LEFT HEART CATHETERIZATION WITH Beatrix Fetters;  Surgeon: Josue Hector, MD;  Location: [facility] CATH LAB;  Service: Cardiovascular;  Laterality: N/A;  . PLEURAL BIOPSY N/A 11/27/2014   Procedure: PLEURAL BIOPSY;  Surgeon: [name], MD;  Location: [place];  Service: Thoracic;  Laterality: N/A;  . PLEURAL EFFUSION DRAINAGE Right 11/27/2014   Procedure: DRAINAGE OF PLEURAL EFFUSION;  Surgeon: [name], MD;  Location: [place];  Service: Thoracic;  Laterality: Right;  . TALC PLEURODESIS Right 11/27/2014   Procedure: Pietro Cassis;  Surgeon: [name], MD;  Location: [facility] OR;  Service: Thoracic;  Laterality: Right;  Marland Kitchen VIDEO ASSISTED THORACOSCOPY Right 11/27/2014   Procedure: RIGHT VIDEO ASSISTED THORACOSCOPY;  Surgeon: [name], MD;  Location: [place];  Service: Thoracic;  Laterality: Right;   Current Outpatient Prescriptions on File Prior to Visit  Medication Sig Dispense Refill  . aspirin 81 MG chewable tablet Chew 1 tablet (81 mg total) by mouth daily.    Marland Kitchen atorvastatin (LIPITOR) 80 MG tablet TAKE ONE TABLET BY MOUTH ONCE DAILY AT  6:00  PM 90 tablet 4  . bosutinib (BOSULIF) 500 MG tablet Take 1 tablet (500 mg total) by mouth daily with breakfast. Take with food. (Patient not taking: Reported on 11/17/2015) 90 tablet 3  . carvedilol (COREG) 6.25 MG tablet TAKE ONE TABLET BY MOUTH TWICE DAILY 180 tablet 1  . clopidogrel (PLAVIX) 75 MG tablet TAKE ONE TABLET BY MOUTH  DAILY 30 tablet 0  . diphenoxylate-atropine (LOMOTIL) 2.5-0.025 MG tablet Take 1 tablet by mouth 4 (four) times daily as needed for diarrhea or loose stools. 90 tablet 1  . furosemide (LASIX) 20 MG tablet TAKE ONE TABLET BY MOUTH DAILY 30 tablet 11  . guaiFENesin (MUCINEX) 600 MG 12 hr tablet Take 1200 mg by mouth 2-3 times a day as needed for cough or to loosen phlegm    . losartan  (COZAAR) 25 MG tablet Take 1 tablet (25 mg total) by mouth daily. 30 tablet 11  . nitroGLYCERIN (NITROSTAT) 0.4 MG SL tablet DISSOLVE 1 TABLET UNDER THE TONGUE EVERY 5 MINUTES AS NEEDED FOR CHEST PAIN 25 tablet 4  . ondansetron (ZOFRAN) 8 MG tablet TAKE ONE TABLET BY MOUTH EVERY 8 HOURS AS NEEDED FOR  NAUSEA  OR  VOMITING 60 tablet 0  . potassium chloride SA (K-DUR,KLOR-CON) 20 MEQ tablet Take 20 mEq by mouth daily.     No current facility-administered medications on file prior to visit.    Allergies  Allergen Reactions  . Norvasc [Amlodipine Besylate] Rash    rash  . Sulfa Antibiotics Hives    Hives    Social History   Social History  . Marital status: Married    Spouse name: N/A  . Number of children: 2  . Years of education: N/A   Occupational History  .      retired Development worker, [employer]   Social History Main Topics  . Smoking status: Former Smoker    Packs/day: 3.00    Years: 45.00    Types: Cigarettes    Quit date: 08/08/1997  . Smokeless tobacco: Former Systems developer  . Alcohol use No  . Drug use: No  . Sexual activity: Not on file   Other Topics Concern  . Not on file   Social History Narrative  . No narrative on file      Review of Systems  All other systems reviewed and are negative.      Objective:   Physical Exam  Constitutional: He appears well-developed and well-nourished. No distress.  HENT:  Right Ear: External ear normal.  Left Ear: External ear normal.  Nose: Nose normal.  Mouth/Throat: Oropharynx is clear and moist. No oropharyngeal exudate.  Eyes: Conjunctivae are normal.  Neck: Neck supple.  Cardiovascular: Normal rate, regular rhythm and normal heart sounds.   No murmur heard. Pulmonary/Chest: Effort normal. No respiratory distress. He has no wheezes. He has rales. He exhibits no tenderness.  Abdominal: Soft. Bowel sounds are normal. He exhibits no distension. There is no tenderness. There is no rebound and  no guarding.  Lymphadenopathy:    He has no  cervical adenopathy.  Skin: He is not diaphoretic.  Vitals reviewed.         Assessment & Plan:  Cardiomyopathy, ischemic - Plan: CBC with Differential/Platelet, COMPLETE METABOLIC PANEL WITH GFR, Lipid panel, ECHOCARDIOGRAM COMPLETE  ASCVD (arteriosclerotic cardiovascular disease) - Plan: CBC with Differential/Platelet, COMPLETE METABOLIC PANEL WITH GFR, Lipid panel, ECHOCARDIOGRAM COMPLETE  Essential hypertension - Plan: CBC with Differential/Platelet, COMPLETE METABOLIC PANEL WITH GFR, Lipid panel  Recurrent right pleural effusion  His blood pressures very high today. However the patient states this is due to whitecoat syndrome. He would like to check his blood pressure at home for 5 times over the next 48 hours, record these values, and bring them to me on Friday. If the blood pressure is persistently elevated, I will increase his medication to compensate. I will schedule the patient for an echocardiogram. If his ejection fraction is less than 40%, I would discontinue losartan and replace with Entresto.  I will also check a fasting lipid panel. His goal LDL cholesterol is less than 70.

## 2016-04-14 ENCOUNTER — Other Ambulatory Visit: Payer: Self-pay | Admitting: Family Medicine

## 2016-04-28 ENCOUNTER — Ambulatory Visit (INDEPENDENT_AMBULATORY_CARE_PROVIDER_SITE_OTHER): Payer: Commercial Managed Care - HMO | Admitting: *Deleted

## 2016-04-28 DIAGNOSIS — Z23 Encounter for immunization: Secondary | ICD-10-CM | POA: Diagnosis not present

## 2016-04-28 NOTE — Progress Notes (Signed)
Patient seen in office for Influenza Vaccination.   Tolerated IM administration well.   Immunization history updated.  

## 2016-05-11 ENCOUNTER — Other Ambulatory Visit: Payer: Self-pay | Admitting: Family Medicine

## 2016-05-24 ENCOUNTER — Other Ambulatory Visit (HOSPITAL_COMMUNITY): Payer: Commercial Managed Care - HMO

## 2016-05-26 ENCOUNTER — Ambulatory Visit (INDEPENDENT_AMBULATORY_CARE_PROVIDER_SITE_OTHER): Payer: Commercial Managed Care - HMO | Admitting: *Deleted

## 2016-05-26 DIAGNOSIS — I255 Ischemic cardiomyopathy: Secondary | ICD-10-CM | POA: Diagnosis not present

## 2016-05-26 NOTE — Progress Notes (Signed)
Remote ICD transmission.   

## 2016-05-27 ENCOUNTER — Encounter: Payer: Self-pay | Admitting: Cardiology

## 2016-06-07 ENCOUNTER — Other Ambulatory Visit: Payer: Self-pay

## 2016-06-07 ENCOUNTER — Ambulatory Visit (HOSPITAL_COMMUNITY): Payer: Commercial Managed Care - HMO | Attending: Cardiology

## 2016-06-07 DIAGNOSIS — I371 Nonrheumatic pulmonary valve insufficiency: Secondary | ICD-10-CM | POA: Insufficient documentation

## 2016-06-07 DIAGNOSIS — Z950 Presence of cardiac pacemaker: Secondary | ICD-10-CM | POA: Diagnosis not present

## 2016-06-07 DIAGNOSIS — I252 Old myocardial infarction: Secondary | ICD-10-CM | POA: Insufficient documentation

## 2016-06-07 DIAGNOSIS — I2510 Atherosclerotic heart disease of native coronary artery without angina pectoris: Secondary | ICD-10-CM | POA: Diagnosis not present

## 2016-06-07 DIAGNOSIS — I255 Ischemic cardiomyopathy: Secondary | ICD-10-CM | POA: Diagnosis not present

## 2016-06-10 ENCOUNTER — Other Ambulatory Visit (HOSPITAL_BASED_OUTPATIENT_CLINIC_OR_DEPARTMENT_OTHER): Payer: Commercial Managed Care - HMO

## 2016-06-10 ENCOUNTER — Encounter: Payer: Self-pay | Admitting: Cardiology

## 2016-06-10 DIAGNOSIS — C9210 Chronic myeloid leukemia, BCR/ABL-positive, not having achieved remission: Secondary | ICD-10-CM | POA: Diagnosis not present

## 2016-06-10 LAB — COMPREHENSIVE METABOLIC PANEL
ALBUMIN: 3.6 g/dL (ref 3.5–5.0)
ALK PHOS: 121 U/L (ref 40–150)
ALT: 18 U/L (ref 0–55)
ANION GAP: 7 meq/L (ref 3–11)
AST: 17 U/L (ref 5–34)
BILIRUBIN TOTAL: 0.57 mg/dL (ref 0.20–1.20)
BUN: 18.2 mg/dL (ref 7.0–26.0)
CO2: 26 meq/L (ref 22–29)
CREATININE: 1.1 mg/dL (ref 0.7–1.3)
Calcium: 9.3 mg/dL (ref 8.4–10.4)
Chloride: 112 mEq/L — ABNORMAL HIGH (ref 98–109)
EGFR: 69 mL/min/{1.73_m2} — ABNORMAL LOW (ref >90)
GLUCOSE: 90 mg/dL (ref 70–140)
Potassium: 4.3 mEq/L (ref 3.5–5.1)
Sodium: 145 mEq/L (ref 136–145)
TOTAL PROTEIN: 7.1 g/dL (ref 6.4–8.3)

## 2016-06-10 LAB — CBC WITH DIFFERENTIAL/PLATELET
BASO%: 1 % (ref 0.0–2.0)
BASOS ABS: 0.1 10*3/uL (ref 0.0–0.1)
EOS%: 1.9 % (ref 0.0–7.0)
Eosinophils Absolute: 0.1 10*3/uL (ref 0.0–0.5)
HCT: 34.2 % — ABNORMAL LOW (ref 38.4–49.9)
HGB: 11.4 g/dL — ABNORMAL LOW (ref 13.0–17.1)
LYMPH%: 12.4 % — ABNORMAL LOW (ref 14.0–49.0)
MCH: 31 pg (ref 27.2–33.4)
MCHC: 33.2 g/dL (ref 32.0–36.0)
MCV: 93.4 fL (ref 79.3–98.0)
MONO#: 0.5 10*3/uL (ref 0.1–0.9)
MONO%: 9.2 % (ref 0.0–14.0)
NEUT#: 4.3 10*3/uL (ref 1.5–6.5)
NEUT%: 75.5 % — AB (ref 39.0–75.0)
Platelets: 171 10*3/uL (ref 140–400)
RBC: 3.67 10*6/uL — ABNORMAL LOW (ref 4.20–5.82)
RDW: 14.8 % — AB (ref 11.0–14.6)
WBC: 5.7 10*3/uL (ref 4.0–10.3)
lymph#: 0.7 10*3/uL — ABNORMAL LOW (ref 0.9–3.3)

## 2016-06-21 ENCOUNTER — Telehealth: Payer: Self-pay | Admitting: Family Medicine

## 2016-06-21 ENCOUNTER — Ambulatory Visit (HOSPITAL_BASED_OUTPATIENT_CLINIC_OR_DEPARTMENT_OTHER): Payer: Commercial Managed Care - HMO | Admitting: Hematology and Oncology

## 2016-06-21 ENCOUNTER — Encounter: Payer: Self-pay | Admitting: Hematology and Oncology

## 2016-06-21 ENCOUNTER — Telehealth: Payer: Self-pay | Admitting: Hematology and Oncology

## 2016-06-21 VITALS — BP 142/66 | HR 76 | Temp 98.6°F | Resp 18 | Ht 69.0 in | Wt 186.4 lb

## 2016-06-21 DIAGNOSIS — T451X5A Adverse effect of antineoplastic and immunosuppressive drugs, initial encounter: Secondary | ICD-10-CM

## 2016-06-21 DIAGNOSIS — C9210 Chronic myeloid leukemia, BCR/ABL-positive, not having achieved remission: Secondary | ICD-10-CM

## 2016-06-21 DIAGNOSIS — D6481 Anemia due to antineoplastic chemotherapy: Secondary | ICD-10-CM | POA: Diagnosis not present

## 2016-06-21 LAB — CUP PACEART REMOTE DEVICE CHECK
Date Time Interrogation Session: 20171019082305
HighPow Impedance: 48 Ohm
Implantable Lead Implant Date: 20151014
Implantable Lead Location: 753860
Lead Channel Impedance Value: 361 Ohm
Lead Channel Pacing Threshold Amplitude: 0.875 V
Lead Channel Pacing Threshold Pulse Width: 0.4 ms
Lead Channel Sensing Intrinsic Amplitude: 7.625 mV
Lead Channel Setting Sensing Sensitivity: 0.3 mV
MDC IDC MSMT BATTERY REMAINING LONGEVITY: 124 mo
MDC IDC MSMT BATTERY VOLTAGE: 3.01 V
MDC IDC MSMT LEADCHNL RV IMPEDANCE VALUE: 304 Ohm
MDC IDC MSMT LEADCHNL RV SENSING INTR AMPL: 7.625 mV
MDC IDC PG IMPLANT DT: 20151014
MDC IDC SET LEADCHNL RV PACING AMPLITUDE: 2 V
MDC IDC SET LEADCHNL RV PACING PULSEWIDTH: 0.4 ms
MDC IDC STAT BRADY RV PERCENT PACED: 0.02 %

## 2016-06-21 NOTE — Telephone Encounter (Signed)
Please give patient echocardiogram results. His echo shows that his injection fraction is now up to 45-50% which is an improvement from the one I see in 2015. He still has evidence of his cardiomyopathy. He also has some calcifications on his valves but this can just be monitored. Dr. Dennard Schaumann was concerned about recent pleural effusions that he had which is why this was obtained. See if he has a follow-up with his cardiologist. I would not change his medication at this time.

## 2016-06-21 NOTE — Telephone Encounter (Signed)
Copy of  AVS report and appointment schedule was given to patient, per 06/21/16 los.

## 2016-06-22 NOTE — Progress Notes (Signed)
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 BCR/ABL by PCR detected abnormalitis with b2a2 & b3a2 subtypes      05/09/2012 - 03/16/2015 Chemotherapy    He was started on treatment with Dasatinib 100 mg daily      06/06/2012 Adverse Reaction    Dose of medication was reduced to 50 mg daily due to pancytopenia      01/24/2013 Progression    Patient was noted to have elevated blood count which and was subsequently found to be noncompliant to treatment. The patient has not been on treatment for several months because his prescription ran out. He was restarted back on treatment      01/16/2014 Progression    Bcr/ABL by PCR is worse. Dose of Dasatinib was increased to 100 mg.      02/26/2014 Tumor Marker    Blood work for ABL kinase mutation was negative.      10/29/2014 Tumor Marker    BCR/ABL b2a2 & b3a2 0.29%, IS 0.1624%, not in MMR yet but improving      11/05/2014 Adverse Reaction    He had thoracentesis due to pleural effusion      11/27/2014 Surgery    He underwent right video-assisted thoracoscopy, Drainage of pleural effusion, Pleural biopsy, Diaphragm biopsy, Lung biopsy & [place] pleurodesis      01/30/2015 Tumor Marker    BCR/ABL b2a2 & b3a2 0.78%, IS 0.4368%, not in MMR       03/11/2015 Pathology Results    BCR/ABL b2a2 0.19%, IS 0.1064%, not in MMR       03/16/2015 Adverse Reaction    Dasatinib was stopped due  to recurrent pleural effusion      03/28/2015 -  Chemotherapy    He started on Bosutinib      03/30/2015 Procedure    He has therapeutic thoracentesis of the right lung with 1 liter of fluid removed      03/30/2015 Adverse Reaction    Bosutinib is placed on hold, to be restart on 8/25 at 250 mg due to severe diarrhea      04/28/2015 Tumor Marker    BCR/ABL b2a2 0.06%, IS 0.0336%, in MMR       06/10/2015 Tumor Marker    BCR/ABL b2a2 0.14%, IS 0.1162%, not in MMR       08/14/2015 Pathology Results    BCR/ABL b2a2 0.02%, IS 0.0166%, In MMR       11/09/2015 Tumor Marker    BCR/ABL b2a2 0.005%, IS 0.0042%, In MMR       02/08/2016 Tumor Marker    BCR/ABL undetectable. In MMR       INTERVAL HISTORY: Please see below for problem oriented charting. He is in today with his wife. He feels well. Denies recent chest pain or shortness of breath. Denies recent diarrhea. He tolerated treatment well  REVIEW OF SYSTEMS:   Constitutional: Denies fevers, chills or abnormal weight loss Eyes: Denies blurriness of vision Ears, nose, mouth, throat, and face: Denies  mucositis or sore throat Respiratory: Denies cough, dyspnea or wheezes Cardiovascular: Denies palpitation, chest discomfort or lower extremity swelling Gastrointestinal:  Denies nausea, heartburn or change in bowel habits Skin: Denies abnormal skin rashes Lymphatics: Denies new lymphadenopathy or easy bruising Neurological:Denies numbness, tingling or new weaknesses Behavioral/Psych: Mood is stable, no new changes  All other systems were reviewed with the patient and are negative.  I have reviewed the past medical history, past surgical history, social history and family history with the patient and they are unchanged from previous note.  ALLERGIES:  is allergic to norvasc [amlodipine besylate] and sulfa antibiotics.  MEDICATIONS:  Current Outpatient Prescriptions  Medication Sig Dispense Refill  . aspirin 81 MG chewable  tablet Chew 1 tablet (81 mg total) by mouth daily.    Marland Kitchen atorvastatin (LIPITOR) 80 MG tablet TAKE ONE TABLET BY MOUTH ONCE DAILY AT  6:00  PM 90 tablet 4  . bosutinib (BOSULIF) 500 MG tablet Take 1 tablet (500 mg total) by mouth daily with breakfast. Take with food. 90 tablet 3  . carvedilol (COREG) 6.25 MG tablet TAKE ONE TABLET BY MOUTH TWICE DAILY 180 tablet 1  . clopidogrel (PLAVIX) 75 MG tablet TAKE ONE TABLET BY MOUTH ONCE DAILY 30 tablet 0  . furosemide (LASIX) 20 MG tablet TAKE ONE TABLET BY MOUTH DAILY 30 tablet 11  . guaiFENesin (MUCINEX) 600 MG 12 hr tablet Take 1200 mg by mouth 2-3 times a day as needed for cough or to loosen phlegm    . losartan (COZAAR) 25 MG tablet Take 1 tablet (25 mg total) by mouth daily. 30 tablet 11  . diphenoxylate-atropine (LOMOTIL) 2.5-0.025 MG tablet Take 1 tablet by mouth 4 (four) times daily as needed for diarrhea or loose stools. (Patient not taking: Reported on 06/21/2016) 90 tablet 1  . nitroGLYCERIN (NITROSTAT) 0.4 MG SL tablet DISSOLVE 1 TABLET UNDER THE TONGUE EVERY 5 MINUTES AS NEEDED FOR CHEST PAIN (Patient not taking: Reported on 06/21/2016) 25 tablet 4  . ondansetron (ZOFRAN) 8 MG tablet TAKE ONE TABLET BY MOUTH EVERY 8 HOURS AS NEEDED FOR  NAUSEA  OR  VOMITING (Patient not taking: Reported on 06/21/2016) 60 tablet 0   No current facility-administered medications for this visit.     PHYSICAL EXAMINATION: ECOG PERFORMANCE STATUS: 0 - Asymptomatic  Vitals:   06/21/16 0818  BP: (!) 142/66  Pulse: 76  Resp: 18  Temp: 98.6 F (37 C)   Filed Weights   06/21/16 0818  Weight: 186 lb 6.4 oz (84.6 kg)    GENERAL:alert, no distress and comfortable SKIN: skin color, texture, turgor are normal, no rashes or significant lesions EYES: normal, Conjunctiva are pink and non-injected, sclera clear OROPHARYNX:no exudate, no erythema and lips, buccal mucosa, and tongue normal  NECK: supple, thyroid normal size, non-tender, without nodularity LYMPH:   no palpable lymphadenopathy in the cervical, axillary or inguinal LUNGS: clear to auscultation and percussion with normal breathing effort HEART: regular rate & rhythm and no murmurs and no lower extremity edema ABDOMEN:abdomen soft, non-tender and normal bowel sounds Musculoskeletal:no cyanosis of digits and no clubbing  NEURO: alert & oriented x 3 with fluent speech, no focal motor/sensory deficits  LABORATORY DATA:  I have reviewed the data as listed    Component Value Date/Time   NA 145 06/10/2016 0748   K 4.3 06/10/2016 0748   CL 108 04/05/2016 0830   CL 110 (H) 11/14/2012 0918   CO2 26 06/10/2016 0748   GLUCOSE 90 06/10/2016 0748  GLUCOSE 108 (H) 11/14/2012 0918   BUN 18.2 06/10/2016 0748   CREATININE 1.1 06/10/2016 0748   CALCIUM 9.3 06/10/2016 0748   PROT 7.1 06/10/2016 0748   ALBUMIN 3.6 06/10/2016 0748   AST 17 06/10/2016 0748   ALT 18 06/10/2016 0748   ALKPHOS 121 06/10/2016 0748   BILITOT 0.57 06/10/2016 0748   GFRNONAA 67 04/05/2016 0830   GFRAA 78 04/05/2016 0830    No results found for: SPEP, UPEP  Lab Results  Component Value Date   WBC 5.7 06/10/2016   NEUTROABS 4.3 06/10/2016   HGB 11.4 (L) 06/10/2016   HCT 34.2 (L) 06/10/2016   MCV 93.4 06/10/2016   PLT 171 06/10/2016      Chemistry      Component Value Date/Time   NA 145 06/10/2016 0748   K 4.3 06/10/2016 0748   CL 108 04/05/2016 0830   CL 110 (H) 11/14/2012 0918   CO2 26 06/10/2016 0748   BUN 18.2 06/10/2016 0748   CREATININE 1.1 06/10/2016 0748      Component Value Date/Time   CALCIUM 9.3 06/10/2016 0748   ALKPHOS 121 06/10/2016 0748   AST 17 06/10/2016 0748   ALT 18 06/10/2016 0748   BILITOT 0.57 06/10/2016 0748       ASSESSMENT & PLAN:  Chronic myelogenous leukemia He tolerated treatment poorly with severe diarrhea. Since we resumed treatment with 50% dose adjustment, he is doing well. In fact, he has no diarrhea for the last 3 months and has not needed to take Imodium or  Lomotil. The patient is delighted to know that he has achieved major molecular response recent undetectable BCR/ABL. I will see him back in 6 months for repeat blood work, history and physical exam.  Anemia due to antineoplastic chemotherapy This is likely due to recent treatment. The patient denies recent history of bleeding such as epistaxis, hematuria or hematochezia. He is asymptomatic from the anemia. I will observe for now   Orders Placed This Encounter  Procedures  . CBC with Differential/Platelet    Standing Status:   Future    Standing Expiration Date:   07/26/2017  . Comprehensive metabolic panel    Standing Status:   Future    Standing Expiration Date:   07/26/2017  . BCR-ABL    With RT-PCR technique    Standing Status:   Future    Standing Expiration Date:   07/26/2017   All questions were answered. The patient knows to call the clinic with any problems, questions or concerns. No barriers to learning was detected. I spent 15 minutes counseling the patient face to face. The total time spent in the appointment was 20 minutes and more than 50% was on counseling and review of test results     [name], MD 06/22/2016 10:28 AM

## 2016-06-22 NOTE — Assessment & Plan Note (Signed)
This is likely due to recent treatment. The patient denies recent history of bleeding such as epistaxis, hematuria or hematochezia. He is asymptomatic from the anemia. I will observe for now.    

## 2016-06-22 NOTE — Assessment & Plan Note (Signed)
He tolerated treatment poorly with severe diarrhea. Since we resumed treatment with 50% dose adjustment, he is doing well. In fact, he has no diarrhea for the last 3 months and has not needed to take Imodium or Lomotil. The patient is delighted to know that he has achieved major molecular response recent undetectable BCR/ABL. I will see him back in 6 months for repeat blood work, history and physical exam.

## 2016-06-24 NOTE — Telephone Encounter (Signed)
Patients wife aware of results.

## 2016-07-05 ENCOUNTER — Other Ambulatory Visit: Payer: Self-pay | Admitting: Family Medicine

## 2016-07-06 NOTE — Telephone Encounter (Signed)
Medication refilled per protocol.Patient needs to be seen before any further refills 

## 2016-08-16 ENCOUNTER — Encounter: Payer: Self-pay | Admitting: Family Medicine

## 2016-08-16 ENCOUNTER — Ambulatory Visit (INDEPENDENT_AMBULATORY_CARE_PROVIDER_SITE_OTHER): Payer: Medicare HMO | Admitting: Family Medicine

## 2016-08-16 VITALS — BP 130/60 | HR 58 | Temp 98.7°F | Resp 20 | Ht 69.0 in | Wt 192.0 lb

## 2016-08-16 DIAGNOSIS — N5201 Erectile dysfunction due to arterial insufficiency: Secondary | ICD-10-CM | POA: Diagnosis not present

## 2016-08-16 DIAGNOSIS — R350 Frequency of micturition: Secondary | ICD-10-CM | POA: Diagnosis not present

## 2016-08-16 LAB — URINALYSIS, ROUTINE W REFLEX MICROSCOPIC
Bilirubin Urine: NEGATIVE
Glucose, UA: NEGATIVE
Hgb urine dipstick: NEGATIVE
Leukocytes, UA: NEGATIVE
NITRITE: NEGATIVE
SPECIFIC GRAVITY, URINE: 1.025 (ref 1.001–1.035)
pH: 5.5 (ref 5.0–8.0)

## 2016-08-16 LAB — URINALYSIS, MICROSCOPIC ONLY
CRYSTALS: NONE SEEN [HPF]
Casts: NONE SEEN [LPF]
Yeast: NONE SEEN [HPF]

## 2016-08-16 NOTE — Progress Notes (Signed)
Subjective:    Patient ID: Anthony Wolfe, male    DOB: 1946-04-17, 71 y.o.   MRN: [id number]  HPI Patient states the last few months, he has noticed polyuria. Patient has a sudden urge to urinate every 1-2 hours. He reports a strong urine flow. He denies any dribbling. He denies any dysuria. He denies any hesitancy. He does report frequency and urgency. Symptoms are more consistent with overactive bladder. His urinalysis today is completely normal. He is also concerned about worsening erectile dysfunction. Patient states that he is unable to achieve an erection. Both he and his wife are concerned by this because prior to the last few months they've had an active sex life despite his cardiovascular disease. Past Medical History:  Diagnosis Date  . AICD (automatic cardioverter/defibrillator) present   . Allergic rhinitis   . 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 Anxiety   . Blood dyscrasia    cmleukemia  . C. difficile colitis 06/16/2014  . CAD (coronary artery disease)    a. s/p 3v CABG 2010. b. NSTEMI 05/2014 in setting of VT. Cath impressions: "Recent IMI with occluded SVG to RCA. Has Right to Right collaterals and collaterals from septal and OM. EF 40%."  . Carotid disease, bilateral ([place])    a. Right CEA 2002; known occluded left carotid. b. Duplex 06/2014: known occluded LICA, stable 123456 RICA s/p CEA.  . Chronic systolic CHF (congestive heart failure) ([place])   . CML (chronic myelocytic leukemia) ([place]) 05/09/2012  . Depression   . Emphysema of lung ([place])   . HTN (hypertension)   . Hyperlipidemia   . Ischemic cardiomyopathy    a. Prior EF 36%. b. 2014: 50%. c. 05/2014: EF 35-40% by echo, 40% with inf HK by cath.  . NSTEMI (non-ST elevation myocardial infarction) ([place]) 05/2014  . Pleural effusion 05/2014  . Pneumonia 05/23/2014  . Presence of permanent cardiac pacemaker   . Prostate hypertrophy   . PVC's (premature ventricular contractions)    a. PVC's with prior Holter  showing PVC load of 22%.  . Shortness of breath dyspnea   . Stroke ([place]) 01  . TIA (transient ischemic attack)    ASPVD, S./P. right CEA, 2002, and known occluded left carotid  . Ventricular tachycardia ([place])    a. Admitted with VT 05/2013 - EPS with inducible sustained monomorphic VT; s/p Med[employer] ICD 05/21/14.   Past Surgical History:  Procedure Laterality Date  . BACK SURGERY    . CAROTID ENDARTERECTOMY Right 01  . carotidectomy  2003   right side  . COLONOSCOPY  07/2010  . CORONARY ARTERY BYPASS GRAFT  02/2009   3 vessel  . ELECTROPHYSIOLOGIC STUDY  05/21/14  . ELECTROPHYSIOLOGY STUDY N/A 05/21/2014   Procedure: ELECTROPHYSIOLOGY STUDY;  Surgeon: Deboraha Sprang, MD;  Location: [facility] CATH LAB;  Service: Cardiovascular;  Laterality: N/A;  . EP IMPLANTABLE DEVICE  05/21/14   single chamber Metronic ICD  . LEFT HEART CATHETERIZATION WITH CORONARY/GRAFT ANGIOGRAM N/A 05/19/2014   Procedure: LEFT HEART CATHETERIZATION WITH Beatrix Fetters;  Surgeon: Josue Hector, MD;  Location: [facility] CATH LAB;  Service: Cardiovascular;  Laterality: N/A;  . PLEURAL BIOPSY N/A 11/27/2014   Procedure: PLEURAL BIOPSY;  Surgeon: [name], MD;  Location: [facility];  Service: Thoracic;  Laterality: N/A;  . PLEURAL EFFUSION DRAINAGE Right 11/27/2014   Procedure: DRAINAGE OF PLEURAL EFFUSION;  Surgeon: [name], MD;  Location: [place];  Service: Thoracic;  Laterality: Right;  . TALC PLEURODESIS  Right 11/27/2014   Procedure: Pietro Cassis;  Surgeon: [name], MD;  Location: [place];  Service: Thoracic;  Laterality: Right;  Marland Kitchen VIDEO ASSISTED THORACOSCOPY Right 11/27/2014   Procedure: RIGHT VIDEO ASSISTED THORACOSCOPY;  Surgeon: [name], MD;  Location: [place];  Service: Thoracic;  Laterality: Right;   Current Outpatient Prescriptions on File Prior to Visit  Medication Sig Dispense Refill  . aspirin 81 MG chewable tablet Chew 1 tablet (81 mg total) by mouth daily.    Marland Kitchen  atorvastatin (LIPITOR) 80 MG tablet TAKE ONE TABLET BY MOUTH ONCE DAILY AT  6:00  PM 90 tablet 4  . bosutinib (BOSULIF) 500 MG tablet Take 1 tablet (500 mg total) by mouth daily with breakfast. Take with food. 90 tablet 3  . carvedilol (COREG) 6.25 MG tablet TAKE ONE TABLET BY MOUTH TWICE DAILY 180 tablet 0  . clopidogrel (PLAVIX) 75 MG tablet TAKE ONE TABLET BY MOUTH ONCE DAILY 90 tablet 0  . furosemide (LASIX) 20 MG tablet TAKE ONE TABLET BY MOUTH DAILY 30 tablet 11  . guaiFENesin (MUCINEX) 600 MG 12 hr tablet Take 1200 mg by mouth 2-3 times a day as needed for cough or to loosen phlegm    . losartan (COZAAR) 25 MG tablet Take 1 tablet (25 mg total) by mouth daily. 30 tablet 11  . nitroGLYCERIN (NITROSTAT) 0.4 MG SL tablet DISSOLVE 1 TABLET UNDER THE TONGUE EVERY 5 MINUTES AS NEEDED FOR CHEST PAIN 25 tablet 4  . diphenoxylate-atropine (LOMOTIL) 2.5-0.025 MG tablet Take 1 tablet by mouth 4 (four) times daily as needed for diarrhea or loose stools. (Patient not taking: Reported on 08/16/2016) 90 tablet 1  . ondansetron (ZOFRAN) 8 MG tablet TAKE ONE TABLET BY MOUTH EVERY 8 HOURS AS NEEDED FOR  NAUSEA  OR  VOMITING (Patient not taking: Reported on 08/16/2016) 60 tablet 0   No current facility-administered medications on file prior to visit.    Allergies  Allergen Reactions  . Norvasc [Amlodipine Besylate] Rash    rash  . Sulfa Antibiotics Hives    Hives    Social History   Social History  . Marital status: Married    Spouse name: N/A  . Number of children: 2  . Years of education: N/A   Occupational History  .      retired Development worker, [employer]   Social History Main Topics  . Smoking status: Former Smoker    Packs/day: 3.00    Years: 45.00    Types: Cigarettes    Quit date: 08/08/1997  . Smokeless tobacco: Former Systems developer  . Alcohol use No  . Drug use: No  . Sexual activity: Yes   Other Topics Concern  . Not on file   Social History Narrative  . No narrative on file      Review of  Systems  All other systems reviewed and are negative.      Objective:   Physical Exam  Constitutional: He appears well-developed and well-nourished.  Cardiovascular: Normal rate, regular rhythm and normal heart sounds.   Pulmonary/Chest: Effort normal and breath sounds normal. No respiratory distress. He has no wheezes. He has no rales.  Abdominal: Soft. Bowel sounds are normal. He exhibits no distension. There is no tenderness. There is no rebound and no guarding.          Assessment & Plan:  Frequent urination - Plan: Urinalysis, Routine w reflex microscopic  Erectile dysfunction due to arterial insufficiency  I believe his frequent urination is more  likely a symptom of overactive bladder and BPH. His PSA less than a year ago was 1.3. His prostate exam then was normal. His urinalysis today is normal. He has no desire to treat overactive bladder. He is more concerned by the erectile dysfunction. Therefore we will empirically try the patient on Viagra 50 mg by mouth daily when necessary sexual activity. I recommended the patient avoid nitroglycerin within 48 hours of taking the Viagra. Recheck in one month if no better or sooner if worse

## 2016-08-31 ENCOUNTER — Other Ambulatory Visit: Payer: Self-pay | Admitting: Family Medicine

## 2016-09-06 ENCOUNTER — Other Ambulatory Visit: Payer: Self-pay | Admitting: Family Medicine

## 2016-09-06 MED ORDER — SILDENAFIL CITRATE 100 MG PO TABS: 50.0000 mg | ORAL_TABLET | Freq: Every day | ORAL | 11 refills | Status: DC | PRN

## 2016-09-09 ENCOUNTER — Other Ambulatory Visit: Payer: Self-pay

## 2016-09-09 ENCOUNTER — Telehealth: Payer: Self-pay

## 2016-09-09 DIAGNOSIS — C9210 Chronic myeloid leukemia, BCR/ABL-positive, not having achieved remission: Secondary | ICD-10-CM

## 2016-09-09 MED ORDER — BOSUTINIB 500 MG PO TABS: 500.0000 mg | ORAL_TABLET | Freq: Every day | ORAL | 3 refills | Status: DC

## 2016-09-09 NOTE — Telephone Encounter (Signed)
Called to clarify with patient dosage amount of Bosulif. Patient states that he takes 500 mg daily.

## 2016-09-16 ENCOUNTER — Encounter: Payer: Self-pay | Admitting: Internal Medicine

## 2016-09-16 ENCOUNTER — Ambulatory Visit (INDEPENDENT_AMBULATORY_CARE_PROVIDER_SITE_OTHER): Payer: Medicare HMO | Admitting: Internal Medicine

## 2016-09-16 VITALS — BP 110/60 | HR 78 | Ht 69.0 in | Wt 191.6 lb

## 2016-09-16 DIAGNOSIS — I6529 Occlusion and stenosis of unspecified carotid artery: Secondary | ICD-10-CM

## 2016-09-16 DIAGNOSIS — I472 Ventricular tachycardia, unspecified: Secondary | ICD-10-CM

## 2016-09-16 DIAGNOSIS — I255 Ischemic cardiomyopathy: Secondary | ICD-10-CM

## 2016-09-16 DIAGNOSIS — Z95810 Presence of automatic (implantable) cardiac defibrillator: Secondary | ICD-10-CM

## 2016-09-16 LAB — CUP PACEART INCLINIC DEVICE CHECK
Battery Remaining Longevity: 122 mo
Brady Statistic RV Percent Paced: 0.01 %
Date Time Interrogation Session: 20180209150703
HighPow Impedance: 54 Ohm
Implantable Lead Implant Date: 20151014
Implantable Lead Location: 753860
Implantable Pulse Generator Implant Date: 20151014
Lead Channel Impedance Value: 418 Ohm
Lead Channel Pacing Threshold Amplitude: 1 V
Lead Channel Pacing Threshold Pulse Width: 0.4 ms
Lead Channel Setting Sensing Sensitivity: 0.3 mV
MDC IDC MSMT BATTERY VOLTAGE: 3.01 V
MDC IDC MSMT LEADCHNL RV IMPEDANCE VALUE: 342 Ohm
MDC IDC MSMT LEADCHNL RV SENSING INTR AMPL: 11.25 mV
MDC IDC SET LEADCHNL RV PACING AMPLITUDE: 2.25 V
MDC IDC SET LEADCHNL RV PACING PULSEWIDTH: 0.4 ms

## 2016-09-16 NOTE — Patient Instructions (Signed)
Medication Instructions: - Your physician recommends that you continue on your current medications as directed. Please refer to the Current Medication list given to you today.  Labwork: - none ordered  Procedures/Testing: - Your physician has requested that you have a carotid duplex. This test is an ultrasound of the carotid arteries in your neck. It looks at blood flow through these arteries that supply the brain with blood. Allow one hour for this exam. There are no restrictions or special instructions.  Follow-Up: - Your physician wants you to follow-up in: 6 months with Richardson Dopp, PA. You will receive a reminder letter in the mail two months in advance. If you don't receive a letter, please call our office to schedule the follow-up appointment.  - Remote monitoring is used to monitor your Pacemaker of ICD from home. This monitoring reduces the number of office visits required to check your device to one time per year. It allows [place] to keep an eye on the functioning of your device to ensure it is working properly. You are scheduled for a device check from home on 12/15/16. You may send your transmission at any time that day. If you have a wireless device, the transmission will be sent automatically. After your physician reviews your transmission, you will receive a postcard with your next transmission date.  - Your physician wants you to follow-up in: 1 year with Dr. Caryl Comes. You will receive a reminder letter in the mail two months in advance. If you don't receive a letter, please call our office to schedule the follow-up appointment.  Any Additional Special Instructions Will Be Listed Below (If Applicable).     If you need a refill on your cardiac medications before your next appointment, please call your pharmacy.

## 2016-09-16 NOTE — Progress Notes (Signed)
.      Patient Care Team: [name], MD as PCP - General (Family Medicine) [name], MD (Inactive) as Attending Physician (Cardiology) [name], MD (Family Medicine) [name], MD (Cardiothoracic Surgery) [name], MD as Attending Physician (Vascular Surgery) [name], MD as Consulting Physician (Hematology and Oncology) Deboraha Sprang, MD as Consulting Physician (Cardiology)   HPI  Anthony Wolfe is a 71 y.o. male Seen in follow-up for an ICD implanted for secondary prevention after he presented with ventricular tachycardia 10/15. Catheterization demonstrated occluded vein graft to his RCA ejection fraction was about 40%  He is status post bypass surgery 2010  He underwent EP testing and was found to be inducible.  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   He underwent a thoracentesis initially in 11/15 and redo 12/15.  He saw pulm 1/16 with question as to whether immunotherpay could be explanation of effusion;  He had pleurodesis.   DATE TEST    9/14    Myoview   EF ?? No ischemia  3/171 Carotids R ICA 40-59%//L ICA- T   10/17    Echo   EF 45-50 % LAE 46/2.26/35          No chest pain; Some dyspnea on exertion, but this is much improved for reasons that they are not sure of/    Past Medical History:  Diagnosis Date  . AICD (automatic cardioverter/defibrillator) present   . Allergic rhinitis   . 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 Anxiety   . Blood dyscrasia    cmleukemia  . C. difficile colitis 06/16/2014  . CAD (coronary artery disease)    a. s/p 3v CABG 2010. b. NSTEMI 05/2014 in setting of VT. Cath impressions: "Recent IMI with occluded SVG to RCA. Has Right to Right collaterals and collaterals from septal and OM. EF 40%."  . Carotid disease, bilateral ([place])    a. Right CEA 2002; known occluded left carotid. b. Duplex 06/2014: known  occluded LICA, stable [id number] RICA s/p CEA.  . Chronic systolic CHF (congestive heart failure) ([place])   . CML (chronic myelocytic leukemia) ([place]) 05/09/2012  . Depression   . Emphysema of lung ([place])   . HTN (hypertension)   . Hyperlipidemia   . Ischemic cardiomyopathy    a. Prior EF 36%. b. 2014: 50%. c. 05/2014: EF 35-40% by echo, 40% with inf HK by cath.  . NSTEMI (non-ST elevation myocardial infarction) ([place]) 05/2014  . Pleural effusion 05/2014  . Pneumonia 05/23/2014  . Presence of permanent cardiac pacemaker   . Prostate hypertrophy   . PVC's (premature ventricular contractions)    a. PVC's with prior Holter showing PVC load of 22%.  . Shortness of breath dyspnea   . Stroke ([place]) 01  . TIA (transient ischemic attack)    ASPVD, S./P. right CEA, 2002, and known occluded left carotid  . Ventricular tachycardia ([place])    a. Admitted with VT 05/2013 - EPS with inducible sustained monomorphic VT; s/p Medtronic ICD 05/21/14.    Past Surgical History:  Procedure Laterality Date  . BACK SURGERY    . CAROTID ENDARTERECTOMY Right 01  . carotidectomy  2003   right side  . COLONOSCOPY  07/2010  . CORONARY ARTERY BYPASS GRAFT  02/2009   3 vessel  . ELECTROPHYSIOLOGIC STUDY  05/21/14  . ELECTROPHYSIOLOGY STUDY N/A 05/21/2014   Procedure: ELECTROPHYSIOLOGY STUDY;  Surgeon: Revonda Standard  Caryl Comes, MD;  Location: [facility] CATH LAB;  Service: Cardiovascular;  Laterality: N/A;  . EP IMPLANTABLE DEVICE  05/21/14   single chamber Metronic ICD  . LEFT HEART CATHETERIZATION WITH CORONARY/GRAFT ANGIOGRAM N/A 05/19/2014   Procedure: LEFT HEART CATHETERIZATION WITH Beatrix Fetters;  Surgeon: Josue Hector, MD;  Location: [facility] C[facility] LAB;  Service: Cardiovascular;  Laterality: N/A;  . PLEURAL BIOPSY N/A 11/27/2014   Procedure: PLEURAL BIOPSY;  Surgeon: [name], MD;  Location: [place];  Service: Thoracic;  Laterality: N/A;  . PLEURAL EFFUSION DRAINAGE Right 11/27/2014   Procedure: DRAINAGE OF PLEURAL  EFFUSION;  Surgeon: [name], MD;  Location: [place];  Service: Thoracic;  Laterality: Right;  . TALC PLEURODESIS Right 11/27/2014   Procedure: Pietro Cassis;  Surgeon: [name], MD;  Location: [place];  Service: Thoracic;  Laterality: Right;  Marland Kitchen VIDEO ASSISTED THORACOSCOPY Right 11/27/2014   Procedure: RIGHT VIDEO ASSISTED THORACOSCOPY;  Surgeon: [name], MD;  Location: [place];  Service: Thoracic;  Laterality: Right;    Current Outpatient Prescriptions  Medication Sig Dispense Refill  . aspirin 81 MG chewable tablet Chew 1 tablet (81 mg total) by mouth daily.    Marland Kitchen atorvastatin (LIPITOR) 80 MG tablet TAKE ONE TABLET BY MOUTH ONCE DAILY AT  6:00  PM 90 tablet 4  . bosutinib (BOSULIF) 500 MG tablet Take 1 tablet (500 mg total) by mouth daily with breakfast. Take with food. 90 tablet 3  . carvedilol (COREG) 6.25 MG tablet TAKE ONE TABLET BY MOUTH TWICE DAILY 180 tablet 0  . furosemide (LASIX) 20 MG tablet Take 20 mg by mouth daily as needed for fluid or edema.    Marland Kitchen guaiFENesin (MUCINEX) 600 MG 12 hr tablet Take 1200 mg by mouth 2-3 times a day as needed for cough or to loosen phlegm    . losartan (COZAAR) 25 MG tablet Take 25 mg by mouth daily.    . nitroGLYCERIN (NITROSTAT) 0.4 MG SL tablet DISSOLVE 1 TABLET UNDER THE TONGUE EVERY 5 MINUTES AS NEEDED FOR CHEST PAIN 25 tablet 4  . sildenafil (VIAGRA) 100 MG tablet Take 0.5-1 tablets (50-100 mg total) by mouth daily as needed for erectile dysfunction. 5 tablet 11   No current facility-administered medications for this visit.     Allergies  Allergen Reactions  . Norvasc [Amlodipine Besylate] Rash    rash  . Sulfa Antibiotics Hives    Hives     Review of Systems negative except from HPI and PMH  Physical Exam BP 110/60   Pulse 78   Ht 5\' 9"  (1.753 m)   Wt 191 lb 9.6 oz (86.9 kg)   SpO2 99%   BMI 28.29 kg/m  Well developed and well nourished in no acute distress HENT normal E scleral and icterus  clear Neck Supple JVP flat; carotids brisk and full Device pocket well healed; without hematoma or erythema.  There is no tethering  B even breath sounds Regular rate and rhythm, no murmurs gallops or rub Soft with active bowel sounds No clubbing cyanosis  Edema Alert and oriented, grossly normal motor and sensory function Skin Warm and Dry   ECG demonstrates sinus rhythm at 75 22/12/41 axis -27  inferior T wave inversions  Assessment and  Plan  Ventricular tachycardia No intercurrent sustained Ventricular tachycardia some nonsustained episodes  Syncope - no recurrent   ischemic cardiomyopathy depressed LV function and prior bypass  Implantable defibrillator-Medtronic  Carotid Stenosis  Known L occlusion  Without symptoms of ischemia  Will recheck carotids  Continue statins  No intercurrent Ventricular tachycardia  Without symptoms of ischemia  Euvolemic continue current meds  Afterload reduction medications are relatively limited; however, his blood pressure at this point precludes further up titration.

## 2016-09-26 ENCOUNTER — Ambulatory Visit (HOSPITAL_COMMUNITY): Admission: RE | Admit: 2016-09-26 | Payer: Medicare HMO | Source: Ambulatory Visit

## 2016-10-03 ENCOUNTER — Other Ambulatory Visit: Payer: Self-pay | Admitting: Family Medicine

## 2016-11-03 ENCOUNTER — Encounter: Payer: Self-pay | Admitting: Family Medicine

## 2016-11-03 ENCOUNTER — Other Ambulatory Visit: Payer: Self-pay | Admitting: Family Medicine

## 2016-11-03 ENCOUNTER — Ambulatory Visit (INDEPENDENT_AMBULATORY_CARE_PROVIDER_SITE_OTHER): Payer: Medicare HMO | Admitting: Family Medicine

## 2016-11-03 ENCOUNTER — Ambulatory Visit (HOSPITAL_COMMUNITY)
Admission: RE | Admit: 2016-11-03 | Discharge: 2016-11-03 | Disposition: A | Discharge status: Home / Self Care | Payer: Medicare HMO | Source: Ambulatory Visit | Attending: Family Medicine | Admitting: Family Medicine

## 2016-11-03 VITALS — BP 120/60 | HR 78 | Temp 98.4°F | Resp 14 | Ht 69.0 in | Wt 196.0 lb

## 2016-11-03 DIAGNOSIS — Z951 Presence of aortocoronary bypass graft: Secondary | ICD-10-CM | POA: Diagnosis not present

## 2016-11-03 DIAGNOSIS — J9811 Atelectasis: Secondary | ICD-10-CM | POA: Diagnosis not present

## 2016-11-03 DIAGNOSIS — I517 Cardiomegaly: Secondary | ICD-10-CM | POA: Insufficient documentation

## 2016-11-03 DIAGNOSIS — J90 Pleural effusion, not elsewhere classified: Secondary | ICD-10-CM | POA: Insufficient documentation

## 2016-11-03 DIAGNOSIS — I6523 Occlusion and stenosis of bilateral carotid arteries: Secondary | ICD-10-CM | POA: Insufficient documentation

## 2016-11-03 DIAGNOSIS — R1013 Epigastric pain: Secondary | ICD-10-CM

## 2016-11-03 DIAGNOSIS — Z950 Presence of cardiac pacemaker: Secondary | ICD-10-CM | POA: Diagnosis not present

## 2016-11-03 DIAGNOSIS — R0989 Other specified symptoms and signs involving the circulatory and respiratory systems: Secondary | ICD-10-CM | POA: Diagnosis not present

## 2016-11-03 MED ORDER — ONDANSETRON HCL 4 MG PO TABS: 4.0000 mg | ORAL_TABLET | Freq: Three times a day (TID) | ORAL | 0 refills | Status: DC | PRN

## 2016-11-03 MED ORDER — LEVOFLOXACIN 500 MG PO TABS: 500.0000 mg | ORAL_TABLET | Freq: Every day | ORAL | 0 refills | Status: DC

## 2016-11-03 NOTE — Progress Notes (Signed)
Subjective:    Patient ID: Anthony Wolfe, male    DOB: [birth year], 71 y.o.   MRN: [id number]  HPI Patient states the symptoms began two days ago.  He reports the sudden onset of dry heaves. Patient states that he felt like he had to vomit so bad and repeatedly tried to vomit although nothing would come up. He wretched so hard he strained the muscles in his chest.  He reports extreme nausea and bloating. Last night he had a fever that was subjective and tachycardia associated with it. Over the last few days he has had one or 2 very small bowel movements and very little flatus. His abdomen does seem a bit distended today. He has no appetite. There are no bowel sounds on examination although there is no tenderness to palpation on his exam.  He does have bilateral carotid bruits and is overdue to recheck his known carotid disease. Past Medical History:  Diagnosis Date  . AICD (automatic cardioverter/defibrillator) present   . Allergic rhinitis   . 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 Anxiety   . Blood dyscrasia    cmleukemia  . C. difficile colitis 06/16/2014  . CAD (coronary artery disease)    a. s/p 3v CABG 2010. b. NSTEMI 05/2014 in setting of VT. Cath impressions: "Recent IMI with occluded SVG to RCA. Has Right to Right collaterals and collaterals from septal and OM. EF 40%."  . Carotid disease, bilateral ([place])    a. Right CEA 2002; known occluded left carotid. b. Duplex 06/2014: known occluded LICA, stable 4-43% RICA s/p CEA.  . Chronic systolic CHF (congestive heart failure) ([place])   . 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 Depression   . Emphysema of lung ([place])   . HTN (hypertension)   . Hyperlipidemia   . Ischemic cardiomyopathy    a. Prior EF 36%. b. 2014: 50%. c. 05/2014: EF 35-40% by echo, 40% with inf HK by cath.  . NSTEMI (non-ST elevation myocardial infarction) ([place]) 05/2014  . Pleural effusion 05/2014  . Pneumonia 05/23/2014  . Presence of permanent  cardiac pacemaker   . Prostate hypertrophy   . PVC's (premature ventricular contractions)    a. PVC's with prior Holter showing PVC load of 22%.  . Shortness of breath dyspnea   . Stroke ([place]) 01  . TIA (transient ischemic attack)    ASPVD, S./P. right CEA, 2002, and known occluded left carotid  . Ventricular tachycardia ([place])    a. Admitted with VT 05/2013 - EPS with inducible sustained monomorphic VT; s/p Medtronic ICD 05/21/14.   Past Surgical History:  Procedure Laterality Date  . BACK SURGERY    . CAROTID ENDARTERECTOMY Right 01  . carotidectomy  2003   right side  . COLONOSCOPY  07/2010  . CORONARY ARTERY BYPASS GRAFT  02/2009   3 vessel  . ELECTROPHYSIOLOGIC STUDY  05/21/14  . ELECTROPHYSIOLOGY STUDY N/A 05/21/2014   Procedure: ELECTROPHYSIOLOGY STUDY;  Surgeon: Deboraha Sprang, MD;  Location: [facility] CATH LAB;  Service: Cardiovascular;  Laterality: N/A;  . EP IMPLANTABLE DEVICE  05/21/14   single chamber Metronic ICD  . LEFT HEART CATHETERIZATION WITH CORONARY/GRAFT ANGIOGRAM N/A 05/19/2014   Procedure: LEFT HEART CATHETERIZATION WITH Beatrix Fetters;  Surgeon: Josue Hector, MD;  Location: [facility] CATH LAB;  Service: Cardiovascular;  Laterality: N/A;  . PLEURAL BIOPSY N/A 11/27/2014   Procedure: PLEURAL BIOPSY;  Surgeon: [name], MD;  Location: [place];  Service: Thoracic;  Laterality: N/A;  .  PLEURAL EFFUSION DRAINAGE Right 11/27/2014   Procedure: DRAINAGE OF PLEURAL EFFUSION;  Surgeon: [name], MD;  Location: [place];  Service: Thoracic;  Laterality: Right;  . TALC PLEURODESIS Right 11/27/2014   Procedure: Pietro Cassis;  Surgeon: [name], MD;  Location: [place];  Service: Thoracic;  Laterality: Right;  Marland Kitchen VIDEO ASSISTED THORACOSCOPY Right 11/27/2014   Procedure: RIGHT VIDEO ASSISTED THORACOSCOPY;  Surgeon: [name], MD;  Location: [place];  Service: Thoracic;  Laterality: Right;   Current Outpatient Prescriptions on File Prior to  Visit  Medication Sig Dispense Refill  . aspirin 81 MG chewable tablet Chew 1 tablet (81 mg total) by mouth daily.    Marland Kitchen atorvastatin (LIPITOR) 80 MG tablet TAKE ONE TABLET BY MOUTH ONCE DAILY AT  6:00  PM 90 tablet 4  . bosutinib (BOSULIF) 500 MG tablet Take 1 tablet (500 mg total) by mouth daily with breakfast. Take with food. 90 tablet 3  . carvedilol (COREG) 6.25 MG tablet Take 1 tablet (6.25 mg total) by mouth 2 (two) times daily with a meal. 180 tablet 3  . clopidogrel (PLAVIX) 75 MG tablet Take 1 tablet (75 mg total) by mouth daily. 90 tablet 3  . furosemide (LASIX) 20 MG tablet Take 20 mg by mouth daily as needed for fluid or edema.    Marland Kitchen guaiFENesin (MUCINEX) 600 MG 12 hr tablet Take 1200 mg by mouth 2-3 times a day as needed for cough or to loosen phlegm    . losartan (COZAAR) 25 MG tablet Take 25 mg by mouth daily.    . nitroGLYCERIN (NITROSTAT) 0.4 MG SL tablet DISSOLVE 1 TABLET UNDER THE TONGUE EVERY 5 MINUTES AS NEEDED FOR CHEST PAIN 25 tablet 4  . sildenafil (VIAGRA) 100 MG tablet Take 0.5-1 tablets (50-100 mg total) by mouth daily as needed for erectile dysfunction. 5 tablet 11   No current facility-administered medications on file prior to visit.    Allergies  Allergen Reactions  . Norvasc [Amlodipine Besylate] Rash    rash  . Sulfa Antibiotics Hives    Hives    Social History   Social History  . Marital status: Married    Spouse name: N/A  . Number of children: 2  . Years of education: N/A   Occupational History  .      retired Development worker, [employer]   Social History Main Topics  . Smoking status: Former Smoker    Packs/day: 3.00    Years: 45.00    Types: Cigarettes    Quit date: 08/08/1997  . Smokeless tobacco: Former Systems developer  . Alcohol use No  . Drug use: No  . Sexual activity: Yes   Other Topics Concern  . Not on file   Social History Narrative  . No narrative on file      Review of Systems  All other systems reviewed and are negative.      Objective:    Physical Exam  Constitutional: He appears well-developed and well-nourished.  Cardiovascular: Normal rate, regular rhythm and normal heart sounds.   Pulmonary/Chest: Effort normal and breath sounds normal. No respiratory distress. He has no wheezes. He has no rales.  Abdominal: Soft. He exhibits distension. Bowel sounds are decreased. There is no tenderness. There is no rebound and no guarding.          Assessment & Plan:  Epigastric pain - Plan: DG Abd Acute W/Chest, ondansetron (ZOFRAN) 4 MG tablet, DISCONTINUED: ondansetron (ZOFRAN) 4 MG tablet  Carotid bruit, unspecified  laterality - Plan: US Carotid Bilateral  Patient does not have an acute abdomen on exam. Given his subjective fever and his nausea, it  is most likely that he has viral gastroenteritis.  Whether I am concerned about a partial small bowel obstruction given his exam and his severe nausea. I will treat the patient was Zofran 4 mg every 8 hours as needed for nausea, I'll send him for an abdominal x-ray to rule out small bowel obstruction. If x-ray is normal, I will treat the patient has viral gastroenteritis using Zofran, a brat diet, and pushing fluids. If symptoms worsen he knows to go to the hospital.  Give the patient for carotid Dopplers to evaluate his known carotid artery stenosis

## 2016-11-14 ENCOUNTER — Ambulatory Visit
Admission: RE | Admit: 2016-11-14 | Discharge: 2016-11-14 | Disposition: A | Discharge status: Home / Self Care | Payer: Medicare HMO | Source: Ambulatory Visit | Attending: Family Medicine | Admitting: Family Medicine

## 2016-11-14 DIAGNOSIS — I6523 Occlusion and stenosis of bilateral carotid arteries: Secondary | ICD-10-CM | POA: Diagnosis not present

## 2016-11-14 DIAGNOSIS — R0989 Other specified symptoms and signs involving the circulatory and respiratory systems: Secondary | ICD-10-CM

## 2016-11-15 ENCOUNTER — Ambulatory Visit (INDEPENDENT_AMBULATORY_CARE_PROVIDER_SITE_OTHER): Payer: Medicare HMO | Admitting: Family Medicine

## 2016-11-15 ENCOUNTER — Encounter: Payer: Self-pay | Admitting: Family Medicine

## 2016-11-15 VITALS — BP 120/60 | HR 60 | Temp 98.0°F | Resp 14 | Wt 192.0 lb

## 2016-11-15 DIAGNOSIS — R197 Diarrhea, unspecified: Secondary | ICD-10-CM

## 2016-11-15 LAB — COMPLETE METABOLIC PANEL WITH GFR
ALBUMIN: 3.5 g/dL — AB (ref 3.6–5.1)
ALK PHOS: 82 U/L (ref 40–115)
ALT: 6 U/L — ABNORMAL LOW (ref 9–46)
AST: 11 U/L (ref 10–35)
BILIRUBIN TOTAL: 0.4 mg/dL (ref 0.2–1.2)
BUN: 13 mg/dL (ref 7–25)
CALCIUM: 8.1 mg/dL — AB (ref 8.6–10.3)
CO2: 28 mmol/L (ref 20–31)
CREATININE: 1.3 mg/dL — AB (ref 0.70–1.18)
Chloride: 109 mmol/L (ref 98–110)
GFR, EST AFRICAN AMERICAN: 63 mL/min (ref >=60)
GFR, Est Non African American: 55 mL/min — ABNORMAL LOW (ref >=60)
Glucose, Bld: 110 mg/dL — ABNORMAL HIGH (ref 70–99)
Potassium: 3.7 mmol/L (ref 3.5–5.3)
Sodium: 145 mmol/L (ref 135–146)
TOTAL PROTEIN: 5.9 g/dL — AB (ref 6.1–8.1)

## 2016-11-15 LAB — CBC WITH DIFFERENTIAL/PLATELET
BASOS ABS: 61 {cells}/uL (ref 0–200)
Basophils Relative: 1 %
Eosinophils Absolute: 122 cells/uL (ref 15–500)
Eosinophils Relative: 2 %
HEMATOCRIT: 33.4 % — AB (ref 38.5–50.0)
HEMOGLOBIN: 10.9 g/dL — AB (ref 13.0–17.0)
Lymphocytes Relative: 13 %
Lymphs Abs: 793 cells/uL — ABNORMAL LOW (ref 850–3900)
MCH: 30.4 pg (ref 27.0–33.0)
MCHC: 32.6 g/dL (ref 32.0–36.0)
MCV: 93.3 fL (ref 80.0–100.0)
MONOS PCT: 9 %
MPV: 9.7 fL (ref 7.5–12.5)
Monocytes Absolute: 549 cells/uL (ref 200–950)
Neutro Abs: 4575 cells/uL (ref 1500–7800)
Neutrophils Relative %: 75 %
PLATELETS: 170 10*3/uL (ref 140–400)
RBC: 3.58 MIL/uL — ABNORMAL LOW (ref 4.20–5.80)
RDW: 15.2 % — ABNORMAL HIGH (ref 11.0–15.0)
WBC: 6.1 10*3/uL (ref 3.8–10.8)

## 2016-11-15 MED ORDER — SACCHAROMYCES BOULARDII 250 MG PO CAPS: 250.0000 mg | ORAL_CAPSULE | Freq: Two times a day (BID) | ORAL | 0 refills | Status: DC

## 2016-11-15 MED ORDER — DIPHENOXYLATE-ATROPINE 2.5-0.025 MG PO TABS: 2.0000 | ORAL_TABLET | Freq: Four times a day (QID) | ORAL | 0 refills | Status: DC | PRN

## 2016-11-15 NOTE — Progress Notes (Signed)
Subjective:    Patient ID: Anthony Wolfe, male    DOB: Jan 18, 1946, 71 y.o.   MRN: [id number]  HPI  11/03/16 Patient states the symptoms began two days ago.  He reports the sudden onset of dry heaves. Patient states that he felt like he had to vomit so bad and repeatedly tried to vomit although nothing would come up. He wretched so hard he strained the muscles in his chest.  He reports extreme nausea and bloating. Last night he had a fever that was subjective and tachycardia associated with it. Over the last few days he has had one or 2 very small bowel movements and very little flatus. His abdomen does seem a bit distended today. He has no appetite. There are no bowel sounds on examination although there is no tenderness to palpation on his exam.  He does have bilateral carotid bruits and is overdue to recheck his known carotid disease.  At that time, my plan was: Patient does not have an acute abdomen on exam. Given his subjective fever and his nausea, it  is most likely that he has viral gastroenteritis.  Whether I am concerned about a partial small bowel obstruction given his exam and his severe nausea. I will treat the patient was Zofran 4 mg every 8 hours as needed for nausea, I'll send him for an abdominal x-ray to rule out small bowel obstruction. If x-ray is normal, I will treat the patient has viral gastroenteritis using Zofran, a brat diet, and pushing fluids. If symptoms worsen he knows to go to the hospital.  Give the patient for carotid Dopplers to evaluate his known carotid artery stenosis  11/15/16 X-rays revealed no small bowel obstruction however it did raise the possibility for right lower lobe infiltrate versus atelectasis. I empirically started the patient on Levaquin in case he was developing pneumonia. Patient only took 2 doses of Levaquin and became nauseated and started having diarrhea. Therefore he stopped the Levaquin. However he has had diarrhea ever since. Of note, around the  same time the patient saw me on March 29, his wife develops similar symptoms of nausea vomiting and diarrhea. However her symptoms have lasted only 3 days and then resolved. The symptoms of this patient have gone on for almost 2 weeks. The nausea vomiting have subsided but he continues to have watery stools on a daily basis. He is concerned because he has a history of C. difficile colitis. However he also has chronic diarrhea secondary to his cancer medication. Past Medical History:  Diagnosis Date  . AICD (automatic cardioverter/defibrillator) present   . Allergic rhinitis   . 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 Anxiety   . Blood dyscrasia    cmleukemia  . C. difficile colitis 06/16/2014  . CAD (coronary artery disease)    a. s/p 3v CABG 2010. b. NSTEMI 05/2014 in setting of VT. Cath impressions: "Recent IMI with occluded SVG to RCA. Has Right to Right collaterals and collaterals from septal and OM. EF 40%."  . Carotid disease, bilateral ([place])    a. Right CEA 2002; known occluded left carotid. b. Duplex 06/2014: known occluded LICA, stable 5-78% RICA s/p CEA.  . Chronic systolic CHF (congestive heart failure) ([place])   . CML (chronic myelocytic leukemia) ([place]) 05/09/2012  . Depression   . Emphysema of lung ([place])   . HTN (hypertension)   . Hyperlipidemia   . Ischemic cardiomyopathy    a. Prior EF 36%. b. 2014: 50%. c. 05/2014: EF  35-40% by echo, 40% with inf HK by cath.  . NSTEMI (non-ST elevation myocardial infarction) ([place]) 05/2014  . Pleural effusion 05/2014  . Pneumonia 05/23/2014  . Presence of permanent cardiac pacemaker   . Prostate hypertrophy   . PVC's (premature ventricular contractions)    a. PVC's with prior Holter showing PVC load of 22%.  . Shortness of breath dyspnea   . Stroke ([place]) 01  . TIA (transient ischemic attack)    ASPVD, S./P. right CEA, 2002, and known occluded left carotid  . Ventricular tachycardia ([place])    a. Admitted with VT 05/2013 - EPS with  inducible sustained monomorphic VT; s/p Medtronic ICD 05/21/14.   Past Surgical History:  Procedure Laterality Date  . BACK SURGERY    . CAROTID ENDARTERECTOMY Right 01  . carotidectomy  2003   right side  . COLONOSCOPY  07/2010  . CORONARY ARTERY BYPASS GRAFT  02/2009   3 vessel  . ELECTROPHYSIOLOGIC STUDY  05/21/14  . ELECTROPHYSIOLOGY STUDY N/A 05/21/2014   Procedure: ELECTROPHYSIOLOGY STUDY;  Surgeon: Deboraha Sprang, MD;  Location: [facility] CATH LAB;  Service: Cardiovascular;  Laterality: N/A;  . EP IMPLANTABLE DEVICE  05/21/14   single chamber Metronic ICD  . LEFT HEART CATHETERIZATION WITH CORONARY/GRAFT ANGIOGRAM N/A 05/19/2014   Procedure: LEFT HEART CATHETERIZATION WITH Beatrix Fetters;  Surgeon: Josue Hector, MD;  Location: [facility] CATH LAB;  Service: Cardiovascular;  Laterality: N/A;  . PLEURAL BIOPSY N/A 11/27/2014   Procedure: PLEURAL BIOPSY;  Surgeon: [name], MD;  Location: [place];  Service: Thoracic;  Laterality: N/A;  . PLEURAL EFFUSION DRAINAGE Right 11/27/2014   Procedure: DRAINAGE OF PLEURAL EFFUSION;  Surgeon: [name], MD;  Location: [place];  Service: Thoracic;  Laterality: Right;  . TALC PLEURODESIS Right 11/27/2014   Procedure: Pietro Cassis;  Surgeon: [name], MD;  Location: [place];  Service: Thoracic;  Laterality: Right;  Marland Kitchen VIDEO ASSISTED THORACOSCOPY Right 11/27/2014   Procedure: RIGHT VIDEO ASSISTED THORACOSCOPY;  Surgeon: [name], MD;  Location: [place];  Service: Thoracic;  Laterality: Right;   Current Outpatient Prescriptions on File Prior to Visit  Medication Sig Dispense Refill  . aspirin 81 MG chewable tablet Chew 1 tablet (81 mg total) by mouth daily.    Marland Kitchen atorvastatin (LIPITOR) 80 MG tablet TAKE ONE TABLET BY MOUTH ONCE DAILY AT  6:00  PM 90 tablet 4  . bosutinib (BOSULIF) 500 MG tablet Take 1 tablet (500 mg total) by mouth daily with breakfast. Take with food. 90 tablet 3  . carvedilol (COREG) 6.25 MG  tablet Take 1 tablet (6.25 mg total) by mouth 2 (two) times daily with a meal. 180 tablet 3  . clopidogrel (PLAVIX) 75 MG tablet Take 1 tablet (75 mg total) by mouth daily. 90 tablet 3  . furosemide (LASIX) 20 MG tablet Take 20 mg by mouth daily as needed for fluid or edema.    Marland Kitchen guaiFENesin (MUCINEX) 600 MG 12 hr tablet Take 1200 mg by mouth 2-3 times a day as needed for cough or to loosen phlegm    . levofloxacin (LEVAQUIN) 500 MG tablet Take 1 tablet (500 mg total) by mouth daily. 7 tablet 0  . losartan (COZAAR) 25 MG tablet Take 25 mg by mouth daily.    . nitroGLYCERIN (NITROSTAT) 0.4 MG SL tablet DISSOLVE 1 TABLET UNDER THE TONGUE EVERY 5 MINUTES AS NEEDED FOR CHEST PAIN 25 tablet 4  . ondansetron (ZOFRAN) 4 MG tablet Take 1  tablet (4 mg total) by mouth every 8 (eight) hours as needed for nausea or vomiting. 20 tablet 0  . sildenafil (VIAGRA) 100 MG tablet Take 0.5-1 tablets (50-100 mg total) by mouth daily as needed for erectile dysfunction. 5 tablet 11   No current facility-administered medications on file prior to visit.    Allergies  Allergen Reactions  . Norvasc [Amlodipine Besylate] Rash    rash  . Sulfa Antibiotics Hives    Hives    Social History   Social History  . Marital status: Married    Spouse name: N/A  . Number of children: 2  . Years of education: N/A   Occupational History  .      retired Development worker, international aid   Social History Main Topics  . Smoking status: Former Smoker    Packs/day: 3.00    Years: 45.00    Types: Cigarettes    Quit date: 08/08/1997  . Smokeless tobacco: Former Systems developer  . Alcohol use No  . Drug use: No  . Sexual activity: Yes   Other Topics Concern  . Not on file   Social History Narrative  . No narrative on file      Review of Systems  Gastrointestinal: Positive for diarrhea.  All other systems reviewed and are negative.      Objective:   Physical Exam  Constitutional: He appears well-developed and well-nourished.  Cardiovascular:  Normal rate, regular rhythm and normal heart sounds.   Pulmonary/Chest: Effort normal and breath sounds normal. No respiratory distress. He has no wheezes. He has no rales.  Abdominal: Soft. Bowel sounds are normal. He exhibits no distension. There is no tenderness. There is no rebound and no guarding.          Assessment & Plan:  Diarrhea, unspecified type - Plan: saccharomyces boulardii (FLORASTOR) 250 MG capsule, diphenoxylate-atropine (LOMOTIL) 2.5-0.025 MG tablet, Gastrointestinal Pathogen Panel PCR, CBC with Differential/Platelet, COMPLETE METABOLIC PANEL WITH GFR  I believe the patient likely had a viral gastroenteritis last time I saw him. The Levaquin likely exacerbated his diarrhea. That coupled with his cancer medication is likely continuing the diarrhea. I doubt C. difficile. I will check a GI pathogen panel meanwhile I'll treat the patient symptomatically with Lomotil 1-2 tablets every 6 hours as needed as well as florastor and 50 mg by mouth twice a day as a probiotic. I anticipate that the diarrhea will improve with time and with these measures in am starting now. I anticipate that the GI pathogen panel will be negative.

## 2016-11-16 ENCOUNTER — Other Ambulatory Visit: Payer: Medicare HMO

## 2016-11-16 DIAGNOSIS — R197 Diarrhea, unspecified: Secondary | ICD-10-CM | POA: Diagnosis not present

## 2016-11-17 LAB — GASTROINTESTINAL PATHOGEN PANEL PCR
C. DIFFICILE TOX A/B, PCR: NOT DETECTED
CRYPTOSPORIDIUM, PCR: NOT DETECTED
Campylobacter, PCR: NOT DETECTED
E COLI (ETEC) LT/ST, PCR: NOT DETECTED
E coli (STEC) stx1/stx2, PCR: NOT DETECTED
E coli 0157, PCR: NOT DETECTED
GIARDIA LAMBLIA, PCR: NOT DETECTED
Norovirus, PCR: NOT DETECTED
ROTAVIRUS, PCR: NOT DETECTED
SHIGELLA, PCR: NOT DETECTED
Salmonella, PCR: NOT DETECTED

## 2016-12-06 ENCOUNTER — Other Ambulatory Visit (HOSPITAL_BASED_OUTPATIENT_CLINIC_OR_DEPARTMENT_OTHER): Payer: Medicare HMO

## 2016-12-06 DIAGNOSIS — C9210 Chronic myeloid leukemia, BCR/ABL-positive, not having achieved remission: Secondary | ICD-10-CM

## 2016-12-06 LAB — COMPREHENSIVE METABOLIC PANEL
ALT: 9 U/L (ref 0–55)
ANION GAP: 8 meq/L (ref 3–11)
AST: 12 U/L (ref 5–34)
Albumin: 3.5 g/dL (ref 3.5–5.0)
Alkaline Phosphatase: 106 U/L (ref 40–150)
BILIRUBIN TOTAL: 0.45 mg/dL (ref 0.20–1.20)
BUN: 11.9 mg/dL (ref 7.0–26.0)
CO2: 26 meq/L (ref 22–29)
Calcium: 9 mg/dL (ref 8.4–10.4)
Chloride: 111 mEq/L — ABNORMAL HIGH (ref 98–109)
Creatinine: 1.1 mg/dL (ref 0.7–1.3)
EGFR: 69 mL/min/{1.73_m2} — AB (ref >90)
Glucose: 89 mg/dl (ref 70–140)
POTASSIUM: 3.7 meq/L (ref 3.5–5.1)
Sodium: 145 mEq/L (ref 136–145)
TOTAL PROTEIN: 7 g/dL (ref 6.4–8.3)

## 2016-12-06 LAB — CBC WITH DIFFERENTIAL/PLATELET
BASO%: 0.9 % (ref 0.0–2.0)
Basophils Absolute: 0.1 10*3/uL (ref 0.0–0.1)
EOS%: 2.6 % (ref 0.0–7.0)
Eosinophils Absolute: 0.2 10*3/uL (ref 0.0–0.5)
HCT: 35.6 % — ABNORMAL LOW (ref 38.4–49.9)
HGB: 11.6 g/dL — ABNORMAL LOW (ref 13.0–17.1)
LYMPH%: 12.3 % — AB (ref 14.0–49.0)
MCH: 30.4 pg (ref 27.2–33.4)
MCHC: 32.6 g/dL (ref 32.0–36.0)
MCV: 93.4 fL (ref 79.3–98.0)
MONO#: 0.5 10*3/uL (ref 0.1–0.9)
MONO%: 9.2 % (ref 0.0–14.0)
NEUT%: 75 % (ref 39.0–75.0)
NEUTROS ABS: 4.4 10*3/uL (ref 1.5–6.5)
PLATELETS: 157 10*3/uL (ref 140–400)
RBC: 3.81 10*6/uL — AB (ref 4.20–5.82)
RDW: 14.7 % — ABNORMAL HIGH (ref 11.0–14.6)
WBC: 5.9 10*3/uL (ref 4.0–10.3)
lymph#: 0.7 10*3/uL — ABNORMAL LOW (ref 0.9–3.3)

## 2016-12-07 DIAGNOSIS — C9210 Chronic myeloid leukemia, BCR/ABL-positive, not having achieved remission: Secondary | ICD-10-CM | POA: Diagnosis not present

## 2016-12-13 ENCOUNTER — Encounter: Payer: Self-pay | Admitting: Hematology and Oncology

## 2016-12-13 ENCOUNTER — Telehealth: Payer: Self-pay | Admitting: Hematology and Oncology

## 2016-12-13 ENCOUNTER — Ambulatory Visit (HOSPITAL_BASED_OUTPATIENT_CLINIC_OR_DEPARTMENT_OTHER): Payer: Medicare HMO | Admitting: Hematology and Oncology

## 2016-12-13 VITALS — BP 144/70 | HR 71 | Temp 98.0°F | Resp 18 | Ht 69.0 in | Wt 190.2 lb

## 2016-12-13 DIAGNOSIS — T451X5A Adverse effect of antineoplastic and immunosuppressive drugs, initial encounter: Secondary | ICD-10-CM

## 2016-12-13 DIAGNOSIS — C9210 Chronic myeloid leukemia, BCR/ABL-positive, not having achieved remission: Secondary | ICD-10-CM | POA: Diagnosis not present

## 2016-12-13 DIAGNOSIS — I255 Ischemic cardiomyopathy: Secondary | ICD-10-CM | POA: Diagnosis not present

## 2016-12-13 DIAGNOSIS — R197 Diarrhea, unspecified: Secondary | ICD-10-CM

## 2016-12-13 DIAGNOSIS — K521 Toxic gastroenteritis and colitis: Secondary | ICD-10-CM

## 2016-12-13 DIAGNOSIS — D6481 Anemia due to antineoplastic chemotherapy: Secondary | ICD-10-CM | POA: Diagnosis not present

## 2016-12-13 MED ORDER — DIPHENOXYLATE-ATROPINE 2.5-0.025 MG PO TABS: 2.0000 | ORAL_TABLET | Freq: Four times a day (QID) | ORAL | 3 refills | Status: DC | PRN

## 2016-12-13 NOTE — Progress Notes (Signed)
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 BCR/ABL by PCR detected abnormalitis with b2a2 & b3a2 subtypes      05/09/2012 - 03/16/2015 Chemotherapy    He was started on treatment with Dasatinib 100 mg daily      06/06/2012 Adverse Reaction    Dose of medication was reduced to 50 mg daily due to pancytopenia      01/24/2013 Progression    Patient was noted to have elevated blood count which and was subsequently found to be noncompliant to treatment. The patient has not been on treatment for several months because his prescription ran out. He was restarted back on treatment      01/16/2014 Progression    Bcr/ABL by PCR is worse. Dose of Dasatinib was increased to 100 mg.      02/26/2014 Tumor Marker    Blood work for ABL kinase mutation was negative.      10/29/2014 Tumor Marker    BCR/ABL b2a2 & b3a2 0.29%, IS 0.1624%, not in MMR yet but improving      11/05/2014 Adverse Reaction    He had thoracentesis due to pleural effusion      11/27/2014 Surgery    He underwent right video-assisted thoracoscopy, Drainage of pleural effusion, Pleural biopsy, Diaphragm biopsy, Lung biopsy & [place] pleurodesis      01/30/2015 Tumor Marker    BCR/ABL b2a2 & b3a2 0.78%, IS 0.4368%, not in MMR       03/11/2015 Pathology Results    BCR/ABL b2a2 0.19%, IS 0.1064%, not in MMR       03/16/2015 Adverse Reaction    Dasatinib was  stopped due to recurrent pleural effusion      03/28/2015 -  Chemotherapy    He started on Bosutinib      03/30/2015 Procedure    He has therapeutic thoracentesis of the right lung with 1 liter of fluid removed      03/30/2015 Adverse Reaction    Bosutinib is placed on hold, to be restart on 8/25 at 250 mg due to severe diarrhea      04/28/2015 Tumor Marker    BCR/ABL b2a2 0.06%, IS 0.0336%, in MMR       06/10/2015 Tumor Marker    BCR/ABL b2a2 0.14%, IS 0.1162%, not in MMR       08/14/2015 Pathology Results    BCR/ABL b2a2 0.02%, IS 0.0166%, In MMR       11/09/2015 Tumor Marker    BCR/ABL b2a2 0.005%, IS 0.0042%, In MMR       02/08/2016 Tumor Marker    BCR/ABL undetectable. In MMR      06/13/2016 Tumor Marker    BCR/ABL undetectable. In MMR      12/06/2016 Pathology Results    BCR/ABL undetectable. In MMR       INTERVAL HISTORY: Please see below for problem oriented charting. He returns with his wife today He is doing very well With current dose of chemotherapy, he is able to function  normally He is very active Denies recent exacerbation of congestive heart failure He has intermittent diarrhea, resolved with Lomotil Denies recent infection  REVIEW OF SYSTEMS:   Constitutional: Denies fevers, chills or abnormal weight loss Eyes: Denies blurriness of vision Ears, nose, mouth, throat, and face: Denies mucositis or sore throat Respiratory: Denies cough, dyspnea or wheezes Cardiovascular: Denies palpitation, chest discomfort or lower extremity swelling Skin: Denies abnormal skin rashes Lymphatics: Denies new lymphadenopathy or easy bruising Neurological:Denies numbness, tingling or new weaknesses Behavioral/Psych: Mood is stable, no new changes  All other systems were reviewed with the patient and are negative.  I have reviewed the past medical history, past surgical history, social history and family history with the patient and they are unchanged from previous  note.  ALLERGIES:  is allergic to norvasc [amlodipine besylate] and sulfa antibiotics.  MEDICATIONS:  Current Outpatient Prescriptions  Medication Sig Dispense Refill  . aspirin 81 MG chewable tablet Chew 1 tablet (81 mg total) by mouth daily.    Marland Kitchen atorvastatin (LIPITOR) 80 MG tablet TAKE ONE TABLET BY MOUTH ONCE DAILY AT  6:00  PM 90 tablet 4  . bosutinib (BOSULIF) 500 MG tablet Take 1 tablet (500 mg total) by mouth daily with breakfast. Take with food. 90 tablet 3  . carvedilol (COREG) 6.25 MG tablet Take 1 tablet (6.25 mg total) by mouth 2 (two) times daily with a meal. 180 tablet 3  . clopidogrel (PLAVIX) 75 MG tablet Take 1 tablet (75 mg total) by mouth daily. 90 tablet 3  . diphenoxylate-atropine (LOMOTIL) 2.5-0.025 MG tablet Take 2 tablets by mouth 4 (four) times daily as needed for diarrhea or loose stools. 30 tablet 3  . furosemide (LASIX) 20 MG tablet Take 20 mg by mouth daily as needed for fluid or edema.    Marland Kitchen guaiFENesin (MUCINEX) 600 MG 12 hr tablet Take 1200 mg by mouth 2-3 times a day as needed for cough or to loosen phlegm    . levofloxacin (LEVAQUIN) 500 MG tablet Take 1 tablet (500 mg total) by mouth daily. 7 tablet 0  . losartan (COZAAR) 25 MG tablet Take 25 mg by mouth daily.    . nitroGLYCERIN (NITROSTAT) 0.4 MG SL tablet DISSOLVE 1 TABLET UNDER THE TONGUE EVERY 5 MINUTES AS NEEDED FOR CHEST PAIN 25 tablet 4  . ondansetron (ZOFRAN) 4 MG tablet Take 1 tablet (4 mg total) by mouth every 8 (eight) hours as needed for nausea or vomiting. 20 tablet 0  . saccharomyces boulardii (FLORASTOR) 250 MG capsule Take 1 capsule (250 mg total) by mouth 2 (two) times daily. 60 capsule 0  . sildenafil (VIAGRA) 100 MG tablet Take 0.5-1 tablets (50-100 mg total) by mouth daily as needed for erectile dysfunction. 5 tablet 11   No current facility-administered medications for this visit.     PHYSICAL EXAMINATION: ECOG PERFORMANCE STATUS: 0 - Asymptomatic  Vitals:   12/13/16 0825  BP: (!)  144/70  Pulse: 71  Resp: 18  Temp: 98 F (36.7 C)   Filed Weights   12/13/16 0825  Weight: 190 lb 3.2 oz (86.3 kg)    GENERAL:alert, no distress and comfortable SKIN: skin color, texture, turgor are normal, no rashes or significant lesions EYES: normal, Conjunctiva are pink and non-injected, sclera clear OROPHARYNX:no exudate, no erythema and lips, buccal mucosa, and tongue normal  NECK: supple, thyroid normal size, non-tender, without nodularity LYMPH:  no palpable lymphadenopathy in the cervical, axillary or inguinal LUNGS: clear to auscultation and percussion with normal breathing  effort HEART: regular rate & rhythm and no murmurs and no lower extremity edema ABDOMEN:abdomen soft, non-tender and normal bowel sounds Musculoskeletal:no cyanosis of digits and no clubbing  NEURO: alert & oriented x 3 with fluent speech, no focal motor/sensory deficits  LABORATORY DATA:  I have reviewed the data as listed    Component Value Date/Time   NA 145 12/06/2016 0836   K 3.7 12/06/2016 0836   CL 109 11/15/2016 0934   CL 110 (H) 11/14/2012 0918   CO2 26 12/06/2016 0836   GLUCOSE 89 12/06/2016 0836   GLUCOSE 108 (H) 11/14/2012 0918   BUN 11.9 12/06/2016 0836   CREATININE 1.1 12/06/2016 0836   CALCIUM 9.0 12/06/2016 0836   PROT 7.0 12/06/2016 0836   ALBUMIN 3.5 12/06/2016 0836   AST 12 12/06/2016 0836   ALT 9 12/06/2016 0836   ALKPHOS 106 12/06/2016 0836   BILITOT 0.45 12/06/2016 0836   GFRNONAA 55 (L) 11/15/2016 0934   GFRAA 63 11/15/2016 0934    No results found for: SPEP, UPEP  Lab Results  Component Value Date   WBC 5.9 12/06/2016   NEUTROABS 4.4 12/06/2016   HGB 11.6 (L) 12/06/2016   HCT 35.6 (L) 12/06/2016   MCV 93.4 12/06/2016   PLT 157 12/06/2016      Chemistry      Component Value Date/Time   NA 145 12/06/2016 0836   K 3.7 12/06/2016 0836   CL 109 11/15/2016 0934   CL 110 (H) 11/14/2012 0918   CO2 26 12/06/2016 0836   BUN 11.9 12/06/2016 0836    CREATININE 1.1 12/06/2016 0836      Component Value Date/Time   CALCIUM 9.0 12/06/2016 0836   ALKPHOS 106 12/06/2016 0836   AST 12 12/06/2016 0836   ALT 9 12/06/2016 0836   BILITOT 0.45 12/06/2016 0836       RADIOGRAPHIC STUDIES: I have personally reviewed the radiological images as listed and agreed with the findings in the report. US Carotid Bilateral  Result Date: 11/14/2016 CLINICAL DATA:  71 year old male with a history of carotid bruit (unspecified laterality). Prior clinical history of right-sided carotid endarterectomy. EXAM: BILATERAL CAROTID DUPLEX ULTRASOUND TECHNIQUE: Pearline Cables scale imaging, color Doppler and duplex ultrasound were performed of bilateral carotid and vertebral arteries in the neck. COMPARISON:  None. FINDINGS: Criteria: Quantification of carotid stenosis is based on velocity parameters that correlate the residual internal carotid diameter with NASCET-based stenosis levels, using the diameter of the distal internal carotid lumen as the denominator for stenosis measurement. The following velocity measurements were obtained: RIGHT ICA:  109/51 cm/sec CCA:  17/51 cm/sec SYSTOLIC ICA/CCA RATIO:  1.2 DIASTOLIC ICA/CCA RATIO:  2.2 ECA:  90 cm/sec LEFT ICA:  Chronically occluded CCA:  025/85 cm/sec SYSTOLIC ICA/CCA RATIO:  Not applicable DIASTOLIC ICA/CCA RATIO:  Not applicable ECA:  277 cm/sec RIGHT CAROTID ARTERY: Heterogeneous atherosclerotic plaque in the distal common carotid artery extending into the proximal internal carotid artery. The artery is ectatic consistent with prior endarterectomy. Systolic velocity criteria the estimated stenosis remains less than 50%. There is relatively increased diastolic flow consistent with contralateral carotid artery occlusion. RIGHT VERTEBRAL ARTERY:  Patent with normal antegrade flow. LEFT CAROTID ARTERY: Extensive heterogeneous atherosclerotic plaque beginning in the distal common carotid artery and extending into the proximal internal an  external carotid artery. The internal carotid artery is completely occluded. The external carotid artery is relatively robust. LEFT VERTEBRAL ARTERY:  Patent with normal antegrade flow. IMPRESSION: 1. Surgical changes of prior right internal carotid  endarterectomy with mild residual heterogeneous atherosclerotic plaque. By peak systolic velocity criteria, the estimated stenosis falls in the 1 - 49% range. 2. 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 3. The vertebral arteries are patent with normal antegrade flow bilaterally. Signed, Criselda Peaches, MD [facility] Electronically Signed   By: Jacqulynn Cadet M.D.   On: 11/14/2016 16:52    ASSESSMENT & PLAN:  Chronic myelogenous leukemia He tolerated treatment poorly with severe diarrhea. Since we resumed treatment with 50% dose adjustment, he is doing well. In fact, he has no major diarrhea for the last 3 months and has only needed to take Imodium or Lomotil a few times a week The patient is delighted to know that he has achieved major molecular response recent undetectable BCR/ABL. I will see him back in 6 months for repeat blood work, history and physical exam.  Diarrhea due to drug  He has significant diarrhea in the past causing significant interruption of his treatment. I recommend continues to take Imodium or Lomotil as needed to control his diarrhea and so far he is doing well  Anemia due to antineoplastic chemotherapy This is likely due to recent treatment. The patient denies recent history of bleeding such as epistaxis, hematuria or hematochezia. He is asymptomatic from the anemia. I will observe for now  Cardiomyopathy, ischemic  I suspect his shortness of breath is multifactorial, combination of congestive heart failure from cardiomyopathy, pleural effusion and mild  COPD. Clinically, he does not have signs of volume overload. He will continue medical management.   Orders Placed This Encounter  Procedures  . CBC with Differential/Platelet    Standing Status:   Future    Standing Expiration Date:   01/17/2018  . Comprehensive metabolic panel    Standing Status:   Future    Standing Expiration Date:   01/17/2018  . BCR-ABL    With RT-PCR technique    Standing Status:   Future    Standing Expiration Date:   01/17/2018   All questions were answered. The patient knows to call the clinic with any problems, questions or concerns. No barriers to learning was detected. I spent 15 minutes counseling the patient face to face. The total time spent in the appointment was 20 minutes and more than 50% was on counseling and review of test results     [name], MD 12/13/2016 8:43 AM

## 2016-12-13 NOTE — Assessment & Plan Note (Signed)
This is likely due to recent treatment. The patient denies recent history of bleeding such as epistaxis, hematuria or hematochezia. He is asymptomatic from the anemia. I will observe for now.    

## 2016-12-13 NOTE — Assessment & Plan Note (Signed)
I suspect his shortness of breath is multifactorial, combination of congestive heart failure from cardiomyopathy, pleural effusion and mild COPD. Clinically, he does not have signs of volume overload. He will continue medical management. 

## 2016-12-13 NOTE — Assessment & Plan Note (Signed)
He tolerated treatment poorly with severe diarrhea. Since we resumed treatment with 50% dose adjustment, he is doing well. In fact, he has no major diarrhea for the last 3 months and has only needed to take Imodium or Lomotil a few times a week The patient is delighted to know that he has achieved major molecular response recent undetectable BCR/ABL. I will see him back in 6 months for repeat blood work, history and physical exam.

## 2016-12-13 NOTE — Telephone Encounter (Signed)
Gave patient avs report and appointments for November.  °

## 2016-12-13 NOTE — Assessment & Plan Note (Signed)
He has significant diarrhea in the past causing significant interruption of his treatment. I recommend continues to take Imodium or Lomotil as needed to control his diarrhea and so far he is doing well

## 2016-12-19 ENCOUNTER — Ambulatory Visit (INDEPENDENT_AMBULATORY_CARE_PROVIDER_SITE_OTHER): Payer: Medicare HMO | Admitting: *Deleted

## 2016-12-19 ENCOUNTER — Telehealth: Payer: Self-pay | Admitting: Cardiology

## 2016-12-19 DIAGNOSIS — I255 Ischemic cardiomyopathy: Secondary | ICD-10-CM

## 2016-12-19 LAB — CUP PACEART REMOTE DEVICE CHECK
Date Time Interrogation Session: 20180514165150
HighPow Impedance: 53 Ohm
Implantable Lead Implant Date: 20151014
Implantable Lead Location: 753860
Implantable Pulse Generator Implant Date: 20151014
Lead Channel Pacing Threshold Amplitude: 1 V
Lead Channel Pacing Threshold Pulse Width: 0.4 ms
Lead Channel Sensing Intrinsic Amplitude: 7.625 mV
Lead Channel Sensing Intrinsic Amplitude: 7.625 mV
MDC IDC MSMT BATTERY REMAINING LONGEVITY: 120 mo
MDC IDC MSMT BATTERY VOLTAGE: 3.01 V
MDC IDC MSMT LEADCHNL RV IMPEDANCE VALUE: 361 Ohm
MDC IDC MSMT LEADCHNL RV IMPEDANCE VALUE: 399 Ohm
MDC IDC SET LEADCHNL RV PACING AMPLITUDE: 2 V
MDC IDC SET LEADCHNL RV PACING PULSEWIDTH: 0.4 ms
MDC IDC SET LEADCHNL RV SENSING SENSITIVITY: 0.3 mV
MDC IDC STAT BRADY RV PERCENT PACED: 0.02 %

## 2016-12-19 NOTE — Progress Notes (Signed)
Remote ICD transmission.   

## 2016-12-19 NOTE — Telephone Encounter (Signed)
Spoke with pt and reminded pt of remote transmission that is due today. Pt verbalized understanding.   

## 2016-12-20 ENCOUNTER — Encounter: Payer: Self-pay | Admitting: Cardiology

## 2017-01-03 ENCOUNTER — Encounter: Payer: Self-pay | Admitting: Cardiology

## 2017-02-06 ENCOUNTER — Other Ambulatory Visit: Payer: Self-pay | Admitting: Family Medicine

## 2017-02-16 ENCOUNTER — Other Ambulatory Visit: Payer: Self-pay | Admitting: Family Medicine

## 2017-03-15 ENCOUNTER — Ambulatory Visit (INDEPENDENT_AMBULATORY_CARE_PROVIDER_SITE_OTHER): Payer: Medicare HMO | Admitting: *Deleted

## 2017-03-15 ENCOUNTER — Ambulatory Visit (INDEPENDENT_AMBULATORY_CARE_PROVIDER_SITE_OTHER): Payer: Medicare HMO | Admitting: Cardiology

## 2017-03-15 ENCOUNTER — Encounter: Payer: Self-pay | Admitting: Cardiology

## 2017-03-15 VITALS — BP 120/60 | HR 74 | Ht 69.0 in | Wt 188.0 lb

## 2017-03-15 DIAGNOSIS — I509 Heart failure, unspecified: Secondary | ICD-10-CM | POA: Diagnosis not present

## 2017-03-15 DIAGNOSIS — E785 Hyperlipidemia, unspecified: Secondary | ICD-10-CM | POA: Diagnosis not present

## 2017-03-15 DIAGNOSIS — Z95810 Presence of automatic (implantable) cardiac defibrillator: Secondary | ICD-10-CM | POA: Diagnosis not present

## 2017-03-15 DIAGNOSIS — I472 Ventricular tachycardia, unspecified: Secondary | ICD-10-CM

## 2017-03-15 DIAGNOSIS — I2510 Atherosclerotic heart disease of native coronary artery without angina pectoris: Secondary | ICD-10-CM | POA: Diagnosis not present

## 2017-03-15 DIAGNOSIS — I471 Supraventricular tachycardia: Secondary | ICD-10-CM | POA: Diagnosis not present

## 2017-03-15 DIAGNOSIS — I429 Cardiomyopathy, unspecified: Secondary | ICD-10-CM | POA: Diagnosis not present

## 2017-03-15 DIAGNOSIS — I10 Essential (primary) hypertension: Secondary | ICD-10-CM

## 2017-03-15 DIAGNOSIS — I255 Ischemic cardiomyopathy: Secondary | ICD-10-CM | POA: Diagnosis not present

## 2017-03-15 LAB — LIPID PANEL
CHOL/HDL RATIO: 3.3 ratio (ref 0.0–5.0)
Cholesterol, Total: 102 mg/dL (ref 100–199)
HDL: 31 mg/dL — AB (ref >39)
LDL Calculated: 54 mg/dL (ref 0–99)
TRIGLYCERIDES: 87 mg/dL (ref 0–149)
VLDL CHOLESTEROL CAL: 17 mg/dL (ref 5–40)

## 2017-03-15 LAB — HEPATIC FUNCTION PANEL
ALBUMIN: 4.1 g/dL (ref 3.5–4.8)
ALT: 8 IU/L (ref 0–44)
AST: 12 IU/L (ref 0–40)
Alkaline Phosphatase: 106 IU/L (ref 39–117)
BILIRUBIN TOTAL: 0.5 mg/dL (ref 0.0–1.2)
Bilirubin, Direct: 0.12 mg/dL (ref 0.00–0.40)
Total Protein: 6.6 g/dL (ref 6.0–8.5)

## 2017-03-15 LAB — TSH: TSH: 2.51 u[IU]/mL (ref 0.450–4.500)

## 2017-03-15 MED ORDER — AMIODARONE HCL 200 MG PO TABS: 200.0000 mg | ORAL_TABLET | ORAL | 3 refills | Status: DC

## 2017-03-15 NOTE — Patient Instructions (Signed)
Medication Instructions:  1. START AMIODARONE 200 MG TABLET; PLEASE FOLLOW THE DIRECTIONS LISTED ON BELOW AND ON THE BOTTLE:  TAKE 400 MG (2 TABS) TWICE DAILY FOR 2 WEEKS; THEN DECREASE TO 200 MG TWICE DAILY FOR 2 WEEKS ; THEN YOU WILL CONTINUE ON 200 MG ONCE A DAY   Labwork: TODAY TSH, LFT, LIPID   Testing/Procedures: NONE ORDERED TODAY  Follow-Up: 1. DR. Caryl Comes IN 3-4 WEEKS PER DR. CAMNITZ  2. Your physician wants you to follow-up in: 6 MONTHS WITH DR. Mallie Snooks will receive a reminder letter in the mail two months in advance. If you don't receive a letter, please call our office to schedule the follow-up appointment.   Any Other Special Instructions Will Be Listed Below (If Applicable).  AVOID WORKING OUTSIDE IN THE HEAT ESPECIALLY THE HOTTEST TIME OF THE DAY WHICH IS AROUND 12-3 PM. IF YOU ARE GOING TO WORK OUTSIDE TRY TO DO EARLY MORNING    If you need a refill on your cardiac medications before your next appointment, please call your pharmacy.

## 2017-03-15 NOTE — Progress Notes (Signed)
Cardiology Office Note:    Date:  03/16/2017   ID:  Anthony Wolfe, DOB [birth year], MRN [id number]  PCP:  [name], MD  Cardiologist:  Dr. Radford Pax Electrophysiologist: Dr. Caryl Comes  Referring MD: [name], MD   Chief Complaint  Patient presents with  . Follow-up    CAD, PVC's    History of Present Illness:    Anthony Wolfe is a 71 y.o. male with a past medical history significant for CAD S/P CABG 2010, NSTEMI with Vtach 2015, ICD placement (Medtronic), ICM, CHF, Bilateral carotid artery disease, right CEA, hypertension, hyperlipidemia, anxiety, depression, pneumonia, COPD and C. difficile colitis. He is on bosuttinib  for chronic myelogenous leukemia.  He had a NSTEMI due to RCA vein graft occlusion in 05/2014 and ventricular tachycardia. He underwent a EP testing and was found to be inducible and an ICD was placed. He had a pleural effusion in 06/2014 related to CHF and pneumonia, was treated with antibiotics that resulted in C. difficile. He had recurrent pleural effusion and underwent VATS with talc pleurodesis. There was a question of whether his immunotherapy was contributing to the effusions. The chemotherapy was changed and he has had no recurrent effusions.  The patient is here today with his wife. He is doing well. No chest pain. He has mild DOE with walking up hill. He notes skipped heart beats mostly when he is laying down at night. He also has episodes of racing heart and lightheadedness when he is outside working in his garden in the hottest part of the day. This lasts for up to an hour and resolves when he goes into the air conditioning and rests.   Most recent device check was on 12/19/16 and showed 15 episodes of NSVT and 14 episodes of SVT all occurring in April about the time the patient was seen for viral gastroenteritis. Will check ICD today.  Past Medical History:  Diagnosis Date  . AICD (automatic cardioverter/defibrillator) present   . Allergic  rhinitis   . 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 Anxiety   . Blood dyscrasia    cmleukemia  . C. difficile colitis 06/16/2014  . CAD (coronary artery disease)    a. s/p 3v CABG 2010. b. NSTEMI 05/2014 in setting of VT. Cath impressions: "Recent IMI with occluded SVG to RCA. Has Right to Right collaterals and collaterals from septal and OM. EF 40%."  . Carotid disease, bilateral ([place])    a. Right CEA 2002; known occluded left carotid. b. Duplex 06/2014: known occluded LICA, stable 2-11% RICA s/p CEA.  . Chronic systolic CHF (congestive heart failure) ([place])   . CML (chronic myelocytic leukemia) ([place]) 05/09/2012  . Depression   . Emphysema of lung ([place])   . HTN (hypertension)   . Hyperlipidemia   . Ischemic cardiomyopathy    a. Prior EF 36%. b. 2014: 50%. c. 05/2014: EF 35-40% by echo, 40% with inf HK by cath.  . NSTEMI (non-ST elevation myocardial infarction) ([place]) 05/2014  . Pleural effusion 05/2014  . Pneumonia 05/23/2014  . Presence of permanent cardiac pacemaker   . Prostate hypertrophy   . PVC's (premature ventricular contractions)    a. PVC's with prior Holter showing PVC load of 22%.  . Shortness of breath dyspnea   . Stroke ([place]) 01  . TIA (transient ischemic attack)    ASPVD, S./P. right CEA, 2002, and known occluded left carotid  . Ventricular tachycardia ([place])    a. Admitted with VT 05/2013 -  EPS with inducible sustained monomorphic VT; s/p Medtronic ICD 05/21/14.    Past Surgical History:  Procedure Laterality Date  . BACK SURGERY    . CAROTID ENDARTERECTOMY Right 01  . carotidectomy  2003   right side  . COLONOSCOPY  07/2010  . CORONARY ARTERY BYPASS GRAFT  02/2009   3 vessel  . ELECTROPHYSIOLOGIC STUDY  05/21/14  . ELECTROPHYSIOLOGY STUDY N/A 05/21/2014   Procedure: ELECTROPHYSIOLOGY STUDY;  Surgeon: Deboraha Sprang, MD;  Location: [facility] CATH LAB;  Service: Cardiovascular;  Laterality: N/A;  . EP IMPLANTABLE DEVICE  05/21/14   single chamber Metronic ICD  .  LEFT HEART CATHETERIZATION WITH CORONARY/GRAFT ANGIOGRAM N/A 05/19/2014   Procedure: LEFT HEART CATHETERIZATION WITH Beatrix Fetters;  Surgeon: Josue Hector, MD;  Location: [facility] CATH LAB;  Service: Cardiovascular;  Laterality: N/A;  . PLEURAL BIOPSY N/A 11/27/2014   Procedure: PLEURAL BIOPSY;  Surgeon: [name], MD;  Location: [place];  Service: Thoracic;  Laterality: N/A;  . PLEURAL EFFUSION DRAINAGE Right 11/27/2014   Procedure: DRAINAGE OF PLEURAL EFFUSION;  Surgeon: [name], MD;  Location: [place];  Service: Thoracic;  Laterality: Right;  . TALC PLEURODESIS Right 11/27/2014   Procedure: Pietro Cassis;  Surgeon: [name], MD;  Location: [place];  Service: Thoracic;  Laterality: Right;  Marland Kitchen VIDEO ASSISTED THORACOSCOPY Right 11/27/2014   Procedure: RIGHT VIDEO ASSISTED THORACOSCOPY;  Surgeon: [name], MD;  Location: [facility] OR;  Service: Thoracic;  Laterality: Right;    Current Medications: Current Meds  Medication Sig  . aspirin 81 MG chewable tablet Chew 1 tablet (81 mg total) by mouth daily.  Marland Kitchen atorvastatin (LIPITOR) 80 MG tablet TAKE ONE TABLET BY MOUTH ONCE DAILY AT  6PM.  . bosutinib (BOSULIF) 500 MG tablet Take 1 tablet (500 mg total) by mouth daily with breakfast. Take with food.  . carvedilol (COREG) 6.25 MG tablet Take 1 tablet (6.25 mg total) by mouth 2 (two) times daily with a meal.  . clopidogrel (PLAVIX) 75 MG tablet Take 1 tablet (75 mg total) by mouth daily.  . diphenoxylate-atropine (LOMOTIL) 2.5-0.025 MG tablet Take 2 tablets by mouth 4 (four) times daily as needed for diarrhea or loose stools.  . furosemide (LASIX) 20 MG tablet Take 20 mg by mouth daily as needed for fluid or edema.  Marland Kitchen guaiFENesin (MUCINEX) 600 MG 12 hr tablet Take 1200 mg by mouth 2-3 times a day as needed for cough or to loosen phlegm  . losartan (COZAAR) 25 MG tablet Take 25 mg by mouth daily.  . nitroGLYCERIN (NITROSTAT) 0.4 MG SL tablet DISSOLVE 1 TABLET  UNDER THE TONGUE EVERY 5 MINUTES AS NEEDED FOR CHEST PAIN  . ondansetron (ZOFRAN) 4 MG tablet Take 1 tablet (4 mg total) by mouth every 8 (eight) hours as needed for nausea or vomiting.  . sildenafil (VIAGRA) 100 MG tablet Take 0.5-1 tablets (50-100 mg total) by mouth daily as needed for erectile dysfunction.     Allergies:   Norvasc [amlodipine besylate] and Sulfa antibiotics   Social History   Social History  . Marital status: Married    Spouse name: N/A  . Number of children: 2  . Years of education: N/A   Occupational History  .      retired Development worker, [employer]   Social History Main Topics  . Smoking status: Former Smoker    Packs/day: 3.00    Years: 45.00    Types: Cigarettes    Quit date: 08/08/1997  .  Smokeless tobacco: Former Systems developer  . Alcohol use No  . Drug use: No  . Sexual activity: Yes   Other Topics Concern  . None   Social History Narrative  . None     Family History: The patient's  family history includes Alcohol abuse in his father; Heart disease in his mother. ROS:   Please see the history of present illness.      All other systems reviewed and are negative.  EKGs/Labs/Other Studies Reviewed:    The following studies were reviewed today:   Carotid ultrasound 11/14/16 Right ICA shows surgical changes with estimated stenosis 1-49% Left ICA is chronically occluded secondary to bulky heterogeneous and partially calcified atherosclerotic plaque  Echocardiogram 06/07/2016 EF 45-50%, akinesis of the basal-midinferior myocardium,   Echocardiogram 05/17/14  EF 35-40%, global mild hypokinesis with inferior severe hypokinesis, grade 2 diastolic dysfunction, moderately dilated left atrium   Left heart catheterization 05/19/14 Occluded saphenous vein graft to RCA. Has right to right collaterals and collaterals from septal and OM. EF approximately 40%.  EKG:  EKG is ordered today.  The ekg ordered today demonstrates   Recent Labs: 12/06/2016: BUN 11.9; Creatinine  1.1; HGB 11.6; Platelets 157; Potassium 3.7; Sodium 145 03/15/2017: ALT 8; TSH 2.510   Recent Lipid Panel    Component Value Date/Time   CHOL 102 03/15/2017 1129   TRIG 87 03/15/2017 1129   HDL 31 (L) 03/15/2017 1129   CHOLHDL 3.3 03/15/2017 1129   CHOLHDL 3.1 04/05/2016 0830   VLDL 20 04/05/2016 0830   LDLCALC 54 03/15/2017 1129    Physical Exam:    VS:  BP 120/60   Pulse 74   Ht 5\' 9"  (1.753 m)   Wt 188 lb (85.3 kg)   SpO2 94%   BMI 27.76 kg/m     Wt Readings from Last 3 Encounters:  03/15/17 188 lb (85.3 kg)  12/13/16 190 lb 3.2 oz (86.3 kg)  11/15/16 192 lb (87.1 kg)     Physical Exam  Constitutional: He is oriented to person, place, and time. He appears well-developed and well-nourished. No distress.  HENT:  Head: Normocephalic and atraumatic.  Neck: Normal range of motion. Neck supple. No JVD present. Carotid bruit is not present.  Cardiovascular: Normal rate, regular rhythm and normal heart sounds.  Exam reveals no gallop and no friction rub.   No murmur heard. Pulmonary/Chest: Effort normal and breath sounds normal. No respiratory distress. He has no wheezes. He has no rales. He exhibits no tenderness.  Abdominal: Soft. Bowel sounds are normal. He exhibits no distension. There is no tenderness.  Musculoskeletal: Normal range of motion. He exhibits no edema or deformity.  Neurological: He is alert and oriented to person, place, and time.  Skin: Skin is warm and dry.  Psychiatric: He has a normal mood and affect. His behavior is normal.  Vitals reviewed.    ASSESSMENT:    1. V-tach ([place])   2. Coronary artery disease involving native coronary artery of native heart without angina pectoris   3. Congestive heart failure with cardiomyopathy ([place])   4. Essential hypertension   5. ICD (implantable cardioverter-defibrillator) in place   6. Hyperlipidemia, unspecified hyperlipidemia type    PLAN:    In order of problems listed above:  1. Ventricular  tachycardia -Patient with inducible V tach in 05/2014 noted at time of MI due to occluded SVG. ICD was placed.  -Pt notes skipped heart beats mostly noted at night and episodes of heart racing with lightheadedness  most often when he is working outside in the heat. No syncope and no ICD discharges.  -ICD interrogated in office today. Multiple episodes of VT/SVT noted (75 VT and 36 SVT since Feb). On 8/1 when he was symptomatic, ICD showed tachycardia from 12:52 to 19:18 with elevated heart rates up to max of 207 bpm and morphology change- SVT with aberrancy vs VT. Reviewed with Dr. Curt Bears, electrophysiologist. Will add amiodarone for suppression of VT and have pt follow up with Dr. Caryl Comes. -Patient advised to avoid working out in the heat and to maintain adequate fluid intake   2. CAD -S/P CABG in 2010, NSTEMI due ot RCA vein graft occlusion, region found to be supplied by collateral during cath, no intervention done.  -No anginal symptoms currently -Continue medical therapy with beta blocker, ARB, statin, aspirin  3. Chronic systolic and diastolic heart failure/ischemic cardiomyopathy -EF 35-40% by echo in 2015. Repeat echo in 05/2016 showed EF 45-50%. Medical therapy includes ARB, carvedilol, Lasix as needed. -Recent labs in 12/2016 indicates normal renal function -Today, volume status is stable and patient is mostly asymptomatic except for heart racing or lightheadedness with working outside in his garden in the heat  4. Hypertension -Well controlled on ARB and beta blocker  5. Implantable defibrillator-Medtronic -Followed by Dr. Caryl Comes -Downloaded today weight is frequent VT and SVT, no discharges  6. Dyslipidemia  -Lipid panel on 03/2016 showed LDL of 48 which is at goal of <70. Liver functions were normal in 12/2016. -Will update lipid panel today -Continue current statin    Medication Adjustments/Labs and Tests Ordered: Current medicines are reviewed at length with the patient today.   Concerns regarding medicines are outlined above. Labs and tests ordered and medication changes are outlined in the patient instructions below:  Patient Instructions  Medication Instructions:  1. START AMIODARONE 200 MG TABLET; PLEASE FOLLOW THE DIRECTIONS LISTED ON BELOW AND ON THE BOTTLE:  TAKE 400 MG (2 TABS) TWICE DAILY FOR 2 WEEKS; THEN DECREASE TO 200 MG TWICE DAILY FOR 2 WEEKS ; THEN YOU WILL CONTINUE ON 200 MG ONCE A DAY   Labwork: TODAY TSH, LFT, LIPID   Testing/Procedures: NONE ORDERED TODAY  Follow-Up: 1. DR. Caryl Comes IN 3-4 WEEKS PER DR. CAMNITZ  2. Your physician wants you to follow-up in: 6 MONTHS WITH DR. Mallie Snooks will receive a reminder letter in the mail two months in advance. If you don't receive a letter, please call our office to schedule the follow-up appointment.   Any Other Special Instructions Will Be Listed Below (If Applicable).  AVOID WORKING OUTSIDE IN THE HEAT ESPECIALLY THE HOTTEST TIME OF THE DAY WHICH IS AROUND 12-3 PM. IF YOU ARE GOING TO WORK OUTSIDE TRY TO DO EARLY MORNING    If you need a refill on your cardiac medications before your next appointment, please call your pharmacy.      Signed, Daune Perch, NP  03/16/2017 5:05 PM    [facility]

## 2017-03-17 LAB — CUP PACEART INCLINIC DEVICE CHECK
Battery Remaining Longevity: 117 mo
Battery Voltage: 3.01 V
Brady Statistic RV Percent Paced: 0.03 %
HIGH POWER IMPEDANCE MEASURED VALUE: 53 Ohm
Implantable Lead Implant Date: 20151014
Lead Channel Impedance Value: 361 Ohm
Lead Channel Impedance Value: 418 Ohm
Lead Channel Pacing Threshold Amplitude: 1.125 V
Lead Channel Sensing Intrinsic Amplitude: 8.875 mV
Lead Channel Setting Pacing Pulse Width: 0.4 ms
MDC IDC LEAD LOCATION: 753860
MDC IDC MSMT LEADCHNL RV PACING THRESHOLD PULSEWIDTH: 0.4 ms
MDC IDC MSMT LEADCHNL RV SENSING INTR AMPL: 7.875 mV
MDC IDC PG IMPLANT DT: 20151014
MDC IDC SESS DTM: 20180808144117
MDC IDC SET LEADCHNL RV PACING AMPLITUDE: 2.25 V
MDC IDC SET LEADCHNL RV SENSING SENSITIVITY: 0.3 mV

## 2017-03-17 NOTE — Progress Notes (Signed)
ICD check in clinic. Normal device function. Threshold and sensing consistent with previous device measurements. Impedance trends stable over time. (75) VT-NS episodes, max dur. 22sec, MaxV 203. (36) "SVT" episodes recorded, max dur.40mins. Patient c/o palps on 8/1, states that he was outside working and had not hydrated well. Episodes were reviewed with Dr.Camnitz, amio started this ov, VT likely for all episodes. Histogram distribution appropriate for patient and level of activity. Stable thoracic impedance. No changes made this session. Device programmed at appropriate safety margins. Device programmed to optimize intrinsic conduction. Estimated longevity 9.7 years. Pt will follow up with SK in 3-4 weeks. Patient education completed including shock plan. Alert tones demonstrated for patient.

## 2017-03-20 ENCOUNTER — Ambulatory Visit (INDEPENDENT_AMBULATORY_CARE_PROVIDER_SITE_OTHER): Payer: Medicare HMO | Admitting: *Deleted

## 2017-03-20 DIAGNOSIS — I509 Heart failure, unspecified: Secondary | ICD-10-CM | POA: Diagnosis not present

## 2017-03-20 DIAGNOSIS — I429 Cardiomyopathy, unspecified: Secondary | ICD-10-CM

## 2017-03-20 DIAGNOSIS — I255 Ischemic cardiomyopathy: Secondary | ICD-10-CM

## 2017-03-21 LAB — CUP PACEART REMOTE DEVICE CHECK
Battery Remaining Longevity: 117 mo
Battery Voltage: 3 V
Brady Statistic RV Percent Paced: 0.02 %
Date Time Interrogation Session: 20180813083625
HIGH POWER IMPEDANCE MEASURED VALUE: 52 Ohm
Implantable Pulse Generator Implant Date: 20151014
Lead Channel Impedance Value: 361 Ohm
Lead Channel Impedance Value: 399 Ohm
Lead Channel Sensing Intrinsic Amplitude: 7.75 mV
Lead Channel Setting Pacing Amplitude: 2.25 V
Lead Channel Setting Pacing Pulse Width: 0.4 ms
MDC IDC LEAD IMPLANT DT: 20151014
MDC IDC LEAD LOCATION: 753860
MDC IDC MSMT LEADCHNL RV PACING THRESHOLD AMPLITUDE: 1.125 V
MDC IDC MSMT LEADCHNL RV PACING THRESHOLD PULSEWIDTH: 0.4 ms
MDC IDC MSMT LEADCHNL RV SENSING INTR AMPL: 7.75 mV
MDC IDC SET LEADCHNL RV SENSING SENSITIVITY: 0.3 mV

## 2017-03-21 NOTE — Progress Notes (Signed)
Remote defibrillator check.  

## 2017-03-30 ENCOUNTER — Encounter: Payer: Self-pay | Admitting: Cardiology

## 2017-04-07 ENCOUNTER — Encounter: Payer: Medicare HMO | Admitting: Internal Medicine

## 2017-04-12 ENCOUNTER — Encounter: Payer: Self-pay | Admitting: Internal Medicine

## 2017-04-12 ENCOUNTER — Telehealth (HOSPITAL_COMMUNITY): Payer: Self-pay | Admitting: *Deleted

## 2017-04-12 ENCOUNTER — Ambulatory Visit (INDEPENDENT_AMBULATORY_CARE_PROVIDER_SITE_OTHER): Payer: Medicare HMO | Admitting: Internal Medicine

## 2017-04-12 VITALS — BP 110/62 | HR 60 | Ht 69.0 in | Wt 190.2 lb

## 2017-04-12 DIAGNOSIS — I509 Heart failure, unspecified: Secondary | ICD-10-CM | POA: Diagnosis not present

## 2017-04-12 DIAGNOSIS — I472 Ventricular tachycardia, unspecified: Secondary | ICD-10-CM

## 2017-04-12 DIAGNOSIS — I429 Cardiomyopathy, unspecified: Secondary | ICD-10-CM | POA: Diagnosis not present

## 2017-04-12 MED ORDER — MEXILETINE HCL 200 MG PO CAPS: 200.0000 mg | ORAL_CAPSULE | Freq: Two times a day (BID) | ORAL | 11 refills | Status: DC

## 2017-04-12 NOTE — Patient Instructions (Addendum)
Medication Instructions:  START mexiletine 200 mg twice daily   Labwork: BMET, Magnesium  Testing/Procedures: Your physician has requested that you have en exercise stress myoview. For further information please visit [email or web address]. Please follow instruction sheet, as given.   Follow-Up: Your physician wants you to follow-up in: 3 weeks with Tommye Standard  Any Other Special Instructions Will Be Listed Below (If Applicable).     If you need a refill on your cardiac medications before your next appointment, please call your pharmacy.

## 2017-04-12 NOTE — Progress Notes (Signed)
.      Patient Care Team: [name], MD as PCP - General (Family Medicine) [name]., MD as Attending Physician (Cardiology) [name], MD (Family Medicine) [name], MD (Cardiothoracic Surgery) Early, Arvilla Meres, MD as Attending Physician (Vascular Surgery) [name], MD as Consulting Physician (Hematology and Oncology) Deboraha Sprang, MD as Consulting Physician (Cardiology)   HPI  Anthony Wolfe is a 71 y.o. male Seen in follow-up for an ICD implanted for secondary prevention after he presented with ventricular tachycardia 10/15. Catheterization demonstrated occluded vein graft to his RCA ejection fraction was about 40%  He is status post bypass surgery 2010  He underwent EP testing and was found to be inducible.  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   He underwent a thoracentesis initially in 11/15 and redo 12/15.  He saw pulm 1/16 with question as to whether immunotherpay could be explanation of effusion;  He had pleurodesis.   DATE TEST    9/14    Myoview   EF ?? No ischemia  3/171 Carotids R ICA 40-59%//L ICA- T   10/17    Echo   EF 45-50 % LAE 46/2.26/35        Seen by PA and WC A few weeks ago. Multiple episodes of nonsustained ventricular tachycardia and sVT were noted on the device.  One lasted > 6 hrs and amiodarone was initiated. He was unable to tolerate   No chest pain; Some dyspnea on exertion, but this is much improved for reasons that they are not sure of/  Date Cr K Mg  5/18  1.1 3.7       Past Medical History:  Diagnosis Date  . AICD (automatic cardioverter/defibrillator) present   . Allergic rhinitis   . 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 Anxiety   . Blood dyscrasia    cmleukemia  . C. difficile colitis 06/16/2014  . CAD (coronary artery disease)    a. s/p 3v CABG 2010. b. NSTEMI 05/2014 in setting of VT. Cath  impressions: "Recent IMI with occluded SVG to RCA. Has Right to Right collaterals and collaterals from septal and OM. EF 40%."  . Carotid disease, bilateral ([place])    a. Right CEA 2002; known occluded left carotid. b. Duplex 06/2014: known occluded LICA, stable 3-24% RICA s/p CEA.  . Chronic systolic CHF (congestive heart failure) ([place])   . CML (chronic myelocytic leukemia) ([place]) 05/09/2012  . Depression   . Emphysema of lung ([place])   . HTN (hypertension)   . Hyperlipidemia   . Ischemic cardiomyopathy    a. Prior EF 36%. b. 2014: 50%. c. 05/2014: EF 35-40% by echo, 40% with inf HK by cath.  . NSTEMI (non-ST elevation myocardial infarction) ([place]) 05/2014  . Pleural effusion 05/2014  . Pneumonia 05/23/2014  . Presence of permanent cardiac pacemaker   . Prostate hypertrophy   . PVC's (premature ventricular contractions)    a. PVC's with prior Holter showing PVC load of 22%.  . Shortness of breath dyspnea   . Stroke ([place]) 01  . TIA (transient ischemic attack)    ASPVD, S./P. right CEA, 2002, and known occluded left carotid  . Ventricular tachycardia ([place])    a. Admitted with VT 05/2013 - EPS with inducible sustained monomorphic VT; s/p Medtronic ICD 05/21/14.    Past Surgical History:  Procedure Laterality Date  . BACK SURGERY    . CAROTID ENDARTERECTOMY  Right 01  . carotidectomy  2003   right side  . COLONOSCOPY  07/2010  . CORONARY ARTERY BYPASS GRAFT  02/2009   3 vessel  . ELECTROPHYSIOLOGIC STUDY  05/21/14  . ELECTROPHYSIOLOGY STUDY N/A 05/21/2014   Procedure: ELECTROPHYSIOLOGY STUDY;  Surgeon: Deboraha Sprang, MD;  Location: [facility] CATH LAB;  Service: Cardiovascular;  Laterality: N/A;  . EP IMPLANTABLE DEVICE  05/21/14   single chamber Metronic ICD  . LEFT HEART CATHETERIZATION WITH CORONARY/GRAFT ANGIOGRAM N/A 05/19/2014   Procedure: LEFT HEART CATHETERIZATION WITH Beatrix Fetters;  Surgeon: Josue Hector, MD;  Location[facility] C[facility] LAB;  Service: Cardiovascular;  Laterality:  N/A;  . PLEURAL BIOPSY N/A 11/27/2014   Procedure: PLEURAL BIOPSY;  Surgeon: [name], MD;  Location: [place];  Service: Thoracic;  Laterality: N/A;  . PLEURAL EFFUSION DRAINAGE Right 11/27/2014   Procedure: DRAINAGE OF PLEURAL EFFUSION;  Surgeon: [name], MD;  Location: [place];  Service: Thoracic;  Laterality: Right;  . TALC PLEURODESIS Right 11/27/2014   Procedure: Pietro Cassis;  Surgeon: [name], MD;  Location: [place];  Service: Thoracic;  Laterality: Right;  Marland Kitchen VIDEO ASSISTED THORACOSCOPY Right 11/27/2014   Procedure: RIGHT VIDEO ASSISTED THORACOSCOPY;  Surgeon: [name], MD;  Location: [place];  Service: Thoracic;  Laterality: Right;    Current Outpatient Prescriptions  Medication Sig Dispense Refill  . aspirin 81 MG chewable tablet Chew 1 tablet (81 mg total) by mouth daily.    Marland Kitchen atorvastatin (LIPITOR) 80 MG tablet TAKE ONE TABLET BY MOUTH ONCE DAILY AT  6PM. 90 tablet 4  . bosutinib (BOSULIF) 500 MG tablet Take 1 tablet (500 mg total) by mouth daily with breakfast. Take with food. 90 tablet 3  . carvedilol (COREG) 6.25 MG tablet Take 1 tablet (6.25 mg total) by mouth 2 (two) times daily with a meal. 180 tablet 3  . clopidogrel (PLAVIX) 75 MG tablet Take 1 tablet (75 mg total) by mouth daily. 90 tablet 3  . diphenoxylate-atropine (LOMOTIL) 2.5-0.025 MG tablet Take 2 tablets by mouth 4 (four) times daily as needed for diarrhea or loose stools. 30 tablet 3  . furosemide (LASIX) 20 MG tablet Take 20 mg by mouth daily as needed for fluid or edema.    Marland Kitchen guaiFENesin (MUCINEX) 600 MG 12 hr tablet Take 1200 mg by mouth 2-3 times a day as needed for cough or to loosen phlegm    . losartan (COZAAR) 25 MG tablet Take 25 mg by mouth daily.    . nitroGLYCERIN (NITROSTAT) 0.4 MG SL tablet DISSOLVE 1 TABLET UNDER THE TONGUE EVERY 5 MINUTES AS NEEDED FOR CHEST PAIN 25 tablet 4  . ondansetron (ZOFRAN) 4 MG tablet Take 1 tablet (4 mg total) by mouth every 8  (eight) hours as needed for nausea or vomiting. 20 tablet 0  . sildenafil (VIAGRA) 100 MG tablet Take 0.5-1 tablets (50-100 mg total) by mouth daily as needed for erectile dysfunction. 5 tablet 11   No current facility-administered medications for this visit.     Allergies  Allergen Reactions  . Norvasc [Amlodipine Besylate] Rash    rash  . Sulfa Antibiotics Hives    Hives     Review of Systems negative except from HPI and PMH  Physical Exam BP 110/62   Pulse 60   Ht 5\' 9"  (1.753 m)   Wt 190 lb 3.2 oz (86.3 kg)   SpO2 96%   BMI 28.09 kg/m  Well developed and nourished in  no acute distress HENT normal Neck supple with JVP-flat Carotids brisk and full without bruits Clear Regular rate and rhythm, no murmurs or gallops Abd-soft with active BS without hepatomegaly No Clubbing cyanosis edema Skin-warm and dry A & Oriented  Grossly normal sensory and motor function   ECG demonstrates sinus rhythm at  60 23/13/45  Assessment and  Plan  Ventricular tachycardia  Syncope - no recurrent   ischemic cardiomyopathy depressed LV function and prior bypass  Implantable defibrillator-Medtronic  Carotid Stenosis  Known L occlusion    Without symptoms of ischemia  Continue statins  Recurrent Ventricular tachycardia frequent and misclassified by the device as SVT Unable to tolerate amio so will begin mexilitene  Will arrange followup in 3 weeks    Will undertake myoview to look for ischemic trigger  Will check BMET and Mg    Euvolemic continue current meds  Afterload reduction medications are relatively limited; however, his blood pressure at this point precludes further up titration.

## 2017-04-12 NOTE — Telephone Encounter (Signed)
Attempted to call patient to give instructions for stress test tomorrow.  Phone goes directly to voice mail but DPR specifically says DO NOT LEAVE VM ON ANY PHONE.

## 2017-04-13 ENCOUNTER — Ambulatory Visit (HOSPITAL_COMMUNITY): Payer: Medicare HMO | Attending: Cardiovascular Disease

## 2017-04-13 DIAGNOSIS — Z951 Presence of aortocoronary bypass graft: Secondary | ICD-10-CM | POA: Diagnosis not present

## 2017-04-13 DIAGNOSIS — I252 Old myocardial infarction: Secondary | ICD-10-CM | POA: Diagnosis not present

## 2017-04-13 DIAGNOSIS — I2510 Atherosclerotic heart disease of native coronary artery without angina pectoris: Secondary | ICD-10-CM | POA: Diagnosis not present

## 2017-04-13 DIAGNOSIS — I10 Essential (primary) hypertension: Secondary | ICD-10-CM | POA: Diagnosis not present

## 2017-04-13 DIAGNOSIS — I472 Ventricular tachycardia, unspecified: Secondary | ICD-10-CM

## 2017-04-13 DIAGNOSIS — R0602 Shortness of breath: Secondary | ICD-10-CM | POA: Diagnosis not present

## 2017-04-13 LAB — CUP PACEART INCLINIC DEVICE CHECK
Implantable Pulse Generator Implant Date: 20151014
MDC IDC LEAD IMPLANT DT: 20151014
MDC IDC LEAD LOCATION: 753860
MDC IDC SESS DTM: 20180924095957

## 2017-04-13 LAB — BASIC METABOLIC PANEL
BUN/Creatinine Ratio: 10 (ref 10–24)
BUN: 11 mg/dL (ref 8–27)
CO2: 22 mmol/L (ref 20–29)
CREATININE: 1.11 mg/dL (ref 0.76–1.27)
Calcium: 8.5 mg/dL — ABNORMAL LOW (ref 8.6–10.2)
Chloride: 104 mmol/L (ref 96–106)
GFR calc Af Amer: 76 mL/min/{1.73_m2} (ref >59)
GFR calc non Af Amer: 66 mL/min/{1.73_m2} (ref >59)
GLUCOSE: 97 mg/dL (ref 65–99)
POTASSIUM: 3.7 mmol/L (ref 3.5–5.2)
Sodium: 141 mmol/L (ref 134–144)

## 2017-04-13 LAB — MYOCARDIAL PERFUSION IMAGING
CHL CUP NUCLEAR SDS: 4
CHL CUP RESTING HR STRESS: 64 {beats}/min
CSEPPHR: 80 {beats}/min
LV dias vol: 193 mL (ref 62–150)
LVSYSVOL: 125 mL
RATE: 0.26
SRS: 5
SSS: 9
TID: 1.02

## 2017-04-13 LAB — MAGNESIUM: Magnesium: 2.3 mg/dL (ref 1.6–2.3)

## 2017-04-13 MED ORDER — TECHNETIUM TC 99M TETROFOSMIN IV KIT
32.2000 | PACK | Freq: Once | INTRAVENOUS | Status: AC | PRN
  Administered 2017-04-13: 32.2 via INTRAVENOUS
  Filled 2017-04-13: qty 33

## 2017-04-13 MED ORDER — REGADENOSON 0.4 MG/5ML IV SOLN
0.4000 mg | Freq: Once | INTRAVENOUS | Status: AC
  Administered 2017-04-13: 0.4 mg via INTRAVENOUS

## 2017-04-13 MED ORDER — TECHNETIUM TC 99M TETROFOSMIN IV KIT
10.8000 | PACK | Freq: Once | INTRAVENOUS | Status: AC | PRN
  Administered 2017-04-13: 10.8 via INTRAVENOUS
  Filled 2017-04-13: qty 11

## 2017-04-14 ENCOUNTER — Telehealth: Payer: Self-pay

## 2017-04-14 ENCOUNTER — Encounter: Payer: Self-pay | Admitting: Cardiology

## 2017-04-14 NOTE — Telephone Encounter (Signed)
Pt is aware and agreeable to normal lab and stress test results ;with worsening EF. Pt is aware that Dr. Dennard Schaumann has also received copy of test results. Pt is already scheduled 05/08/17 with [name] for follow up.

## 2017-04-20 ENCOUNTER — Encounter: Payer: Self-pay | Admitting: Family Medicine

## 2017-04-20 ENCOUNTER — Ambulatory Visit (INDEPENDENT_AMBULATORY_CARE_PROVIDER_SITE_OTHER): Payer: Medicare HMO | Admitting: Family Medicine

## 2017-04-20 ENCOUNTER — Ambulatory Visit
Admission: RE | Admit: 2017-04-20 | Discharge: 2017-04-20 | Disposition: A | Discharge status: Home / Self Care | Payer: Medicare HMO | Source: Ambulatory Visit | Attending: Family Medicine | Admitting: Family Medicine

## 2017-04-20 VITALS — BP 132/80 | HR 70 | Temp 98.2°F | Resp 16 | Ht 69.0 in | Wt 191.0 lb

## 2017-04-20 DIAGNOSIS — J90 Pleural effusion, not elsewhere classified: Secondary | ICD-10-CM | POA: Diagnosis not present

## 2017-04-20 DIAGNOSIS — R0609 Other forms of dyspnea: Secondary | ICD-10-CM

## 2017-04-20 NOTE — Progress Notes (Signed)
Subjective:    Patient ID: Anthony Wolfe, male    DOB: 09-01-1945, 71 y.o.   MRN: [id number]  HPI7/21/16 Patient underwent VATS procedure in April for recurrent right pleural effusion. Patient states that ever since that time, he has a persistent daily cough productive of white mucus, waxing and waning shortness of breath, and chest heaviness. He has a significant past medical history including COPD. Patient quit smoking in 1999 there was smoke 3 packs of cigarettes a day for decades prior to that. Also the patient has recently had NSTEMI in October 2015. He is been on an ACE inhibitor and a beta blocker ever since. He denies any hemoptysis. He denies any weight loss. 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  He has been tested on 2 separate occasions for C. difficile colitis both tested been negative.  He has been taking Mucinex 1200 mg a day but this is no longer beneficial. Sunday patient states he felt like he could not catch his breath. He denies any chest pain. He denies any angina.  At that time, my plan was: I am concerned that the patient's symptoms sound like emphysema.  I will screen the patient with pulmonary function test. Also believe that the ACE inhibitor he is taking may be contributing to his cough. Therefore I will discontinue the ACE inhibitor and replace it with losartan 25 mg a day.  Permanent function tests reveal an FEV1 percentage of 74% with an FEV1 of 1.49 L which is 48% of predicted consistent with stage III COPD.  Begin stiolto 2 inhalations daily. Recheck in 2 weeks. Also use albuterol 2 puffs inhaled every 6 hours as needed. Recheck sooner if worse. I did give the patient some Lomotil for diarrhea  03/17/15  patient did very well for 2 weeks on stiolto  And off his ACE inhibitor. However over the last week he is started developing increasing dyspnea on exertion and continues to have a persistent daily cough  productive of white mucus.   He spoke to his oncologist this morning and they believe it could be related to his Sprycell.   After reviewing the literature on this medicine that does report up to 25%  The patient's report dyspnea on this medication and have pleural effusions. He certainly has both. However on examination today he has new left basilar crackles that were not there previously. Also he had been doing better up until last week which makes me think this is a new event exacerbating his underlying issues.  AT that time, my plan was:  while it is possible his medication is causing this I still believe he may have an element of emphysema and I am concerned he may be possibly developing left lower lobe pneumonia or possibly a new left sided pleural effusion. I will obtain a chest x-ray today to evaluate further. If there is evidence of pneumonia, I'll start the patient on antibiotics. If there is a pleural effusion I will start with diabetics. If there is a pleural effusion, he may need to consider stopping Sprycell  Because then the patient would've developed pleural effusions on numerous  Occasions for no reason other than that medication.  05/29/15 Patient ultimately was found to have recurrent pleural effusions secondary to his cancer medication. This was discontinued for 2 weeks by his oncologist, and his shortness of breath dramatically improved. Subsequently he has been started on bosulif.  Patient states that his  dyspnea has returned. He now has dyspnea with minimal exertion. He also reports by basilar pleurisy with deep inspiration. He denies any fevers or chills. He denies any chest congestion. On examination today, his lungs are clear to auscultation bilaterally with no wheezes crackles Rales. He is actually moving a good amount of air compared to his baseline. I do not appreciate any pulmonary abnormality. A stat CBC was obtained my office which shows a white blood cell count 4.7, hemoglobin of  10.5, hematocrit of 32.9. Therefore I do not believe that severe anemia as the cause of his dyspnea.  At that time, my plan was: History and exam does not provide a definitive cause of the shortness of breath. Therefore I would like the patient get a chest x-ray immediately. CBC without anemia. His shortness of breath does not appear to be angina. Congestive heart failure be another possibility. I will await the results of the chest x-ray.  10/06/15 Ultimately work up led to CT scan: 1. Multiple small loculated pleural effusions in the medial, basilar and fissural right pleural space with expected postsurgical changes from prior talc pleurodesis. A small loculated effusion in the inferior right major fissure accounts for the masslike opacity described on the recent chest radiograph. 2. Small layering left pleural effusion, increased since April 2016. 3. Two 4 mm pulmonary nodules, stable for 11 months. Follow-up chest CT is advised in 12 months for this high risk patient. This recommendation follows the consensus statement: Guidelines for Management of Small Pulmonary Nodules Detected on CT Scans: A Statement from the [place] as published in Radiology 2005; 237:395-400. 4. Mild-to-moderate centrilobular emphysema and diffuse bronchial wall thickening, suggesting COPD. 5. Stable mild chronic mediastinal lymphadenopathy, likely benign. 6. Atherosclerosis, including left main and 3 vessel coronary artery disease status post CABG. 7. New asymmetric soft tissue posterior to the right nipple, likely to represent asymmetric gynecomastia. Clinical correlation is Advised.  Patient followed up with his cardiothoracic surgeon. The effusions were well loculated and were stable and they recommended no treatment at that time. Furthermore his breathing has only improved and at the present time he denies any problems with his breathing. He does not want to try anoro due to the fact that all the  inhaled powder seemed to irritate his lungs more and causing more problems coughing. He denies any chest pain shortness of breath or dyspnea on exertion. He is overdue for a PSA. He is also due for fasting lipid panel. He denies any myalgias or right upper quadrant pain on his statin medication. At the present time, his blood pressure is well controlled. He denies any palpitations or syncope or presyncope.  At that time, my plan was: His blood pressure is adequately controlled. I will make no changes in his medication at this time. I would like to check a fasting lipid panel. His goal LDL cholesterol is less than 70. His shortness of breath is multifactorial however I do believe that emphysema is playing a large part of that. At the present time he is asymptomatic. Should symptoms return, I will try the patient on stiolto with the hope that the inhaled mist would not irritate his breathing is much as the inhaled powder. I will also check a PSA while we are obtaining lab work.  04/05/16 Since I last saw the patient, he suffered severe diarrhea. At that time his oncologist decrease the dose of his Bosulif by 50% and the diarrhea completely resolved. Overall he is been doing exceptionally well since  that time. He states that his energy level has improved. He does complain of some mild dyspnea on exertion. He denies any chest pain. He denies any liver disease. He has stable right pleural effusions. On his exam today, there are faint right basilar crackles that are patient has a history of ischemic cardiomyopathy with an ejection fraction of 40%. Due to his multiple medical problems since his myocardial infarction, I do not see if this is been followed up. Patient would be a good candidate for Entresto if cardiomyopathy persist. He is also due to recheck a fasting lipid panel. He denies any myalgias or right upper quadrant pain.  At that time, my plan was: His blood pressures very high today. However the patient  states this is due to whitecoat syndrome. He would like to check his blood pressure at home for 5 times over the next 48 hours, record these values, and bring them to me on Friday. If the blood pressure is persistently elevated, I will increase his medication to compensate. I will schedule the patient for an echocardiogram. If his ejection fraction is less than 40%, I would discontinue losartan and replace with Entresto.  I will also check a fasting lipid panel. His goal LDL cholesterol is less than 70.  04/20/17  Patient presents with steadily worsening dyspnea on exertion.  Can now only go up 1-2 steps without becoming short-winded.  Reports orthopnea.  Reports chest congestion refractory to mucinex.  Has tried and failed stiolto and symbicort in the past. Never tried anoro. Recently, underwent myocardial perfusion imaging showing no ischemia but did show 62-83% systolic ejection fraction.   Past Medical History:  Diagnosis Date  . AICD (automatic cardioverter/defibrillator) present   . Allergic rhinitis   . 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 Anxiety   . Blood dyscrasia    cmleukemia  . C. difficile colitis 06/16/2014  . CAD (coronary artery disease)    a. s/p 3v CABG 2010. b. NSTEMI 05/2014 in setting of VT. Cath impressions: "Recent IMI with occluded SVG to RCA. Has Right to Right collaterals and collaterals from septal and OM. EF 40%."  . Carotid disease, bilateral ([place])    a. Right CEA 2002; known occluded left carotid. b. Duplex 06/2014: known occluded LICA, stable 1-51% RICA s/p CEA.  . Chronic systolic CHF (congestive heart failure) ([place])   . CML (chronic myelocytic leukemia) ([place]) 05/09/2012  . Depression   . Emphysema of lung ([place])   . HTN (hypertension)   . Hyperlipidemia   . Ischemic cardiomyopathy    a. Prior EF 36%. b. 2014: 50%. c. 05/2014: EF 35-40% by echo, 40% with inf HK by cath.  . NSTEMI (non-ST elevation myocardial infarction) ([place]) 05/2014  . Pleural effusion  05/2014  . Pneumonia 05/23/2014  . Presence of permanent cardiac pacemaker   . Prostate hypertrophy   . PVC's (premature ventricular contractions)    a. PVC's with prior Holter showing PVC load of 22%.  . Shortness of breath dyspnea   . Stroke ([place]) 01  . TIA (transient ischemic attack)    ASPVD, S./P. right CEA, 2002, and known occluded left carotid  . Ventricular tachycardia ([place])    a. Admitted with VT 05/2013 - EPS with inducible sustained monomorphic VT; s/p [employer] ICD 05/21/14.   Past Surgical History:  Procedure Laterality Date  . BACK SURGERY    . CAROTID ENDARTERECTOMY Right 01  . carotidectomy  2003   right side  . COLONOSCOPY  07/2010  .  CORONARY ARTERY BYPASS GRAFT  02/2009   3 vessel  . ELECTROPHYSIOLOGIC STUDY  05/21/14  . ELECTROPHYSIOLOGY STUDY N/A 05/21/2014   Procedure: ELECTROPHYSIOLOGY STUDY;  Surgeon: Deboraha Sprang, MD;  Location: [facility] CATH LAB;  Service: Cardiovascular;  Laterality: N/A;  . EP IMPLANTABLE DEVICE  05/21/14   single chamber Metronic ICD  . LEFT HEART CATHETERIZATION WITH CORONARY/GRAFT ANGIOGRAM N/A 05/19/2014   Procedure: LEFT HEART CATHETERIZATION WITH Beatrix Fetters;  Surgeon: Josue Hector, MD;  Location: [facility] CATH LAB;  Service: Cardiovascular;  Laterality: N/A;  . PLEURAL BIOPSY N/A 11/27/2014   Procedure: PLEURAL BIOPSY;  Surgeon: [name], MD;  Location: [place];  Service: Thoracic;  Laterality: N/A;  . PLEURAL EFFUSION DRAINAGE Right 11/27/2014   Procedure: DRAINAGE OF PLEURAL EFFUSION;  Surgeon: [name], MD;  Location: [place];  Service: Thoracic;  Laterality: Right;  . TALC PLEURODESIS Right 11/27/2014   Procedure: Pietro Cassis;  Surgeon: [name], MD;  Location: [place];  Service: Thoracic;  Laterality: Right;  Marland Kitchen VIDEO ASSISTED THORACOSCOPY Right 11/27/2014   Procedure: RIGHT VIDEO ASSISTED THORACOSCOPY;  Surgeon: [name], MD;  Location: [place];  Service: Thoracic;  Laterality:  Right;   Current Outpatient Prescriptions on File Prior to Visit  Medication Sig Dispense Refill  . aspirin 81 MG chewable tablet Chew 1 tablet (81 mg total) by mouth daily.    Marland Kitchen atorvastatin (LIPITOR) 80 MG tablet TAKE ONE TABLET BY MOUTH ONCE DAILY AT  6PM. 90 tablet 4  . bosutinib (BOSULIF) 500 MG tablet Take 1 tablet (500 mg total) by mouth daily with breakfast. Take with food. 90 tablet 3  . carvedilol (COREG) 6.25 MG tablet Take 1 tablet (6.25 mg total) by mouth 2 (two) times daily with a meal. 180 tablet 3  . clopidogrel (PLAVIX) 75 MG tablet Take 1 tablet (75 mg total) by mouth daily. 90 tablet 3  . diphenoxylate-atropine (LOMOTIL) 2.5-0.025 MG tablet Take 2 tablets by mouth 4 (four) times daily as needed for diarrhea or loose stools. 30 tablet 3  . furosemide (LASIX) 20 MG tablet Take 20 mg by mouth daily as needed for fluid or edema.    Marland Kitchen guaiFENesin (MUCINEX) 600 MG 12 hr tablet Take 1200 mg by mouth 2-3 times a day as needed for cough or to loosen phlegm    . losartan (COZAAR) 25 MG tablet Take 25 mg by mouth daily.    Marland Kitchen mexiletine (MEXITIL) 200 MG capsule Take 1 capsule (200 mg total) by mouth 2 (two) times daily. 60 capsule 11  . nitroGLYCERIN (NITROSTAT) 0.4 MG SL tablet DISSOLVE 1 TABLET UNDER THE TONGUE EVERY 5 MINUTES AS NEEDED FOR CHEST PAIN 25 tablet 4  . ondansetron (ZOFRAN) 4 MG tablet Take 1 tablet (4 mg total) by mouth every 8 (eight) hours as needed for nausea or vomiting. 20 tablet 0  . sildenafil (VIAGRA) 100 MG tablet Take 0.5-1 tablets (50-100 mg total) by mouth daily as needed for erectile dysfunction. 5 tablet 11   No current facility-administered medications on file prior to visit.    Allergies  Allergen Reactions  . Norvasc [Amlodipine Besylate] Rash    rash  . Sulfa Antibiotics Hives    Hives    Social History   Social History  . Marital status: Married    Spouse name: N/A  . Number of children: 2  . Years of education: N/A   Occupational History    .  retired Development worker, [employer]   Social History Main Topics  . Smoking status: Former Smoker    Packs/day: 3.00    Years: 45.00    Types: Cigarettes    Quit date: 08/08/1997  . Smokeless tobacco: Former Systems developer  . Alcohol use No  . Drug use: No  . Sexual activity: Yes   Other Topics Concern  . Not on file   Social History Narrative  . No narrative on file      Review of Systems  All other systems reviewed and are negative.      Objective:   Physical Exam  Constitutional: He appears well-developed and well-nourished. No distress.  HENT:  Right Ear: External ear normal.  Left Ear: External ear normal.  Nose: Nose normal.  Mouth/Throat: Oropharynx is clear and moist. No oropharyngeal exudate.  Eyes: Conjunctivae are normal.  Neck: Neck supple.  Cardiovascular: Normal rate, regular rhythm and normal heart sounds.   No murmur heard. Pulmonary/Chest: Effort normal. No respiratory distress. He has no wheezes. He has rales. He exhibits no tenderness.  Abdominal: Soft. Bowel sounds are normal. He exhibits no distension. There is no tenderness. There is no rebound and no guarding.  Lymphadenopathy:    He has no cervical adenopathy.  Skin: He is not diaphoretic.  Vitals reviewed.         Assessment & Plan:  Dyspnea on exertion - Plan: DG Chest 2 View, Ambulatory referral to Pulmonology  Consult pulmonology for second opinion but I feel his dyspnea on exertion is likely due to CHF.  Discontinue losartan and start entresto 24/26 mg pobid and in 2 weeks increase to 49/51 pobid if bp can tolerate.  Reassess here in 3 weeks.  Also obtain CXR to determine if there is any underlying recurrent effusion or pneumonia.  May benefit from high res CT to look for interstitial lung disease if entresto is not benefiical.

## 2017-04-26 ENCOUNTER — Encounter: Payer: Self-pay | Admitting: Pulmonary Disease

## 2017-04-26 ENCOUNTER — Other Ambulatory Visit: Payer: Self-pay | Admitting: Pulmonary Disease

## 2017-04-26 ENCOUNTER — Ambulatory Visit (INDEPENDENT_AMBULATORY_CARE_PROVIDER_SITE_OTHER): Payer: Medicare HMO | Admitting: Pulmonary Disease

## 2017-04-26 VITALS — BP 128/68 | HR 65 | Ht 69.0 in | Wt 187.2 lb

## 2017-04-26 DIAGNOSIS — R0609 Other forms of dyspnea: Secondary | ICD-10-CM | POA: Diagnosis not present

## 2017-04-26 NOTE — Progress Notes (Signed)
Current Outpatient Prescriptions on File Prior to Visit  Medication Sig  . aspirin 81 MG chewable tablet Chew 1 tablet (81 mg total) by mouth daily.  Marland Kitchen atorvastatin (LIPITOR) 80 MG tablet TAKE ONE TABLET BY MOUTH ONCE DAILY AT  6PM.  . bosutinib (BOSULIF) 500 MG tablet Take 1 tablet (500 mg total) by mouth daily with breakfast. Take with food.  . carvedilol (COREG) 6.25 MG tablet Take 1 tablet (6.25 mg total) by mouth 2 (two) times daily with a meal.  . clopidogrel (PLAVIX) 75 MG tablet Take 1 tablet (75 mg total) by mouth daily.  . diphenoxylate-atropine (LOMOTIL) 2.5-0.025 MG tablet Take 2 tablets by mouth 4 (four) times daily as needed for diarrhea or loose stools.  . furosemide (LASIX) 20 MG tablet Take 20 mg by mouth daily as needed for fluid or edema.  Marland Kitchen guaiFENesin (MUCINEX) 600 MG 12 hr tablet Take 1200 mg by mouth 2-3 times a day as needed for cough or to loosen phlegm  . mexiletine (MEXITIL) 200 MG capsule Take 1 capsule (200 mg total) by mouth 2 (two) times daily.  . nitroGLYCERIN (NITROSTAT) 0.4 MG SL tablet DISSOLVE 1 TABLET UNDER THE TONGUE EVERY 5 MINUTES AS NEEDED FOR CHEST PAIN  . ondansetron (ZOFRAN) 4 MG tablet Take 1 tablet (4 mg total) by mouth every 8 (eight) hours as needed for nausea or vomiting.  . sildenafil (VIAGRA) 100 MG tablet Take 0.5-1 tablets (50-100 mg total) by mouth daily as needed for erectile dysfunction.   No current facility-administered medications on file prior to visit.     Cardiac tests 05/17/14 Echo >> EF 35 to 54%, grade 2 diastolic dysfx  Pulmonary tests 06/09/14 Rt thoracentesis >> glucose 100, protein 3.7, LDH 108, WBC 517 (90% lymphs), cytology >> reactive mesothelial cells positive for calretinin, cytokeratin 5/6, WT-1 06/10/14 CT chest >> Rt effusion, ASD RLL, emphysema, 3 mm nodule Lt lung 07/15/14 Rt thoracentesis >> 1.5 liters, LDH 101, WBC 947 (72L, 27M), ADA 10.4, cytology >> reactive mesothelial cells 03/30/15 Lt thoracentesis >> 1  liter, glucose 96, LDH 59, protein 3.2, WBC 1201, (68%L), cytology negative  Past medical history HTN, HLD, CAD, ischemic cardiomyopathy, BPH, Anxiety, Depression, CML  Past surgical history, Family history, Social history, Allergies reviewed  Vital signs BP 128/68 (BP Location: Left Arm, Cuff Size: Normal)   Pulse 65   Ht 5\' 9"  (1.753 m)   Wt 187 lb 3.2 oz (84.9 kg)   SpO2 98%   BMI 27.64 kg/m    History of Present Illness: FLEET HIGHAM is a 71 y.o. male former smoker with dyspnea.  He has hx of b/l pleural effusions, and systolic CHF.  I last saw him in 2016.  He had Rt pleural effusion and talc pleurodesis.   More recently he has started getting more short of breath.  He is not having cough, wheeze, or sputum.  He has hx of smoking and was told he had emphysema.  He has not been on inhalers.  He denies chest pain, or swelling.  He does okay with light activity on level ground, but has trouble going up hills or steps.  He thinks much of his symptoms related to having heart rhythm problems.  He was seen by cardiology and had his settings and medications adjusted.  He had a chest xray on 04/20/17 which showed ovoid opacity in Rt lung with small b/l effusions.  Physical Exam:  General - pleasant Eyes - pupils reactive ENT - no sinus  tenderness, no oral exudate, no LAN Cardiac - regular, no murmur Chest - no wheeze, rales Abd - soft, non tender Ext - no edema Skin - no rashes Neuro - normal strength Psych - normal mood   CMP Latest Ref Rng & Units 04/12/2017 03/15/2017 12/06/2016  Glucose 65 - 99 mg/dL 97 - 89  BUN 8 - 27 mg/dL 11 - 11.9  Creatinine 0.76 - 1.27 mg/dL 1.11 - 1.1  Sodium 134 - 144 mmol/L 141 - 145  Potassium 3.5 - 5.2 mmol/L 3.7 - 3.7  Chloride 96 - 106 mmol/L 104 - -  CO2 20 - 29 mmol/L 22 - 26  Calcium 8.6 - 10.2 mg/dL 8.5(L) - 9.0  Total Protein 6.0 - 8.5 g/dL - 6.6 7.0  Total Bilirubin 0.0 - 1.2 mg/dL - 0.5 0.45  Alkaline Phos 39 - 117 IU/L - 106 106   AST 0 - 40 IU/L - 12 12  ALT 0 - 44 IU/L - 8 9    CBC Latest Ref Rng & Units 12/06/2016 11/15/2016 06/10/2016  WBC 4.0 - 10.3 10e3/uL 5.9 6.1 5.7  Hemoglobin 13.0 - 17.1 g/dL 11.6(L) 10.9(L) 11.4(L)  Hematocrit 38.4 - 49.9 % 35.6(L) 33.4(L) 34.2(L)  Platelets 140 - 400 10e3/uL 157 170 171    Assessment/Plan:  Dyspnea on exertion. - his CHF and arrhythmias are likely contributing to this - he does have hx of smoking and could also have obstructive lung disease - he also has persistent loculated effusion in major fissure and this will need to be characterized further - will arrange for CT angiogram chest with IV contrast and PFTs   Patient Instructions  Will schedule CT chest with IV contrast  Will arrange for pulmonary function test  Follow up in 4 weeks with Dr. Halford Chessman or Nurse Practitioner   Chesley Mires, MD [place] 04/26/2017, 4:01 PM Pager:  [phone]

## 2017-04-26 NOTE — Patient Instructions (Signed)
Will schedule CT chest with IV contrast  Will arrange for pulmonary function test  Follow up in 4 weeks with Dr. Halford Chessman or Nurse Practitioner

## 2017-04-28 ENCOUNTER — Ambulatory Visit
Admission: RE | Admit: 2017-04-28 | Discharge: 2017-04-28 | Disposition: A | Discharge status: Home / Self Care | Payer: Medicare HMO | Source: Ambulatory Visit | Attending: Pulmonary Disease | Admitting: Pulmonary Disease

## 2017-04-28 DIAGNOSIS — R0602 Shortness of breath: Secondary | ICD-10-CM | POA: Diagnosis not present

## 2017-04-28 DIAGNOSIS — R0609 Other forms of dyspnea: Principal | ICD-10-CM

## 2017-04-28 DIAGNOSIS — I700 Atherosclerosis of aorta: Secondary | ICD-10-CM | POA: Diagnosis not present

## 2017-04-28 MED ORDER — IOPAMIDOL (ISOVUE-370) INJECTION 76%
75.0000 mL | Freq: Once | INTRAVENOUS | Status: AC | PRN
  Administered 2017-04-28: 75 mL via INTRAVENOUS

## 2017-05-05 ENCOUNTER — Telehealth: Payer: Self-pay | Admitting: Pulmonary Disease

## 2017-05-05 NOTE — Telephone Encounter (Signed)
Spoke with patient to inform him of results and recommendations per SV. The patient verbalized understanding and denied any questions or concerns at this time.

## 2017-05-05 NOTE — Telephone Encounter (Signed)
CT chest 04/28/17 >> upper lobe predominant emphysema, decreased pleural thickening with areas of calcification, small Lt effusion, 3 mm RUL nodule stable since 2015   Please let pt know that CT chest showed changes of emphysema.  The fluid around his lung looks better and he just has residual changes from prior surgery for the fluid around his lung.  Will call back after review of his PFTs.

## 2017-05-08 ENCOUNTER — Encounter: Payer: Medicare HMO | Admitting: Physician Assistant

## 2017-05-09 ENCOUNTER — Encounter: Payer: Self-pay | Admitting: Family Medicine

## 2017-05-09 ENCOUNTER — Ambulatory Visit (INDEPENDENT_AMBULATORY_CARE_PROVIDER_SITE_OTHER): Payer: Medicare HMO | Admitting: Family Medicine

## 2017-05-09 VITALS — BP 140/60 | HR 69 | Temp 98.8°F | Resp 14 | Ht 69.0 in | Wt 188.0 lb

## 2017-05-09 DIAGNOSIS — R0609 Other forms of dyspnea: Secondary | ICD-10-CM

## 2017-05-09 DIAGNOSIS — Z23 Encounter for immunization: Secondary | ICD-10-CM | POA: Diagnosis not present

## 2017-05-09 DIAGNOSIS — I255 Ischemic cardiomyopathy: Secondary | ICD-10-CM

## 2017-05-09 LAB — COMPLETE METABOLIC PANEL WITH GFR
AG Ratio: 1.4 (calc) (ref 1.0–2.5)
ALT: 5 U/L — AB (ref 9–46)
AST: 10 U/L (ref 10–35)
Albumin: 3.7 g/dL (ref 3.6–5.1)
Alkaline phosphatase (APISO): 94 U/L (ref 40–115)
BUN/Creatinine Ratio: 9 (calc) (ref 6–22)
BUN: 12 mg/dL (ref 7–25)
CALCIUM: 8.6 mg/dL (ref 8.6–10.3)
CO2: 25 mmol/L (ref 20–32)
CREATININE: 1.38 mg/dL — AB (ref 0.70–1.18)
Chloride: 109 mmol/L (ref 98–110)
GFR, EST NON AFRICAN AMERICAN: 50 mL/min/{1.73_m2} — AB (ref >=60)
GFR, Est African American: 58 mL/min/{1.73_m2} — ABNORMAL LOW (ref >=60)
Globulin: 2.7 g/dL (calc) (ref 1.9–3.7)
Glucose, Bld: 127 mg/dL — ABNORMAL HIGH (ref 65–99)
Potassium: 3.9 mmol/L (ref 3.5–5.3)
Sodium: 142 mmol/L (ref 135–146)
Total Bilirubin: 0.4 mg/dL (ref 0.2–1.2)
Total Protein: 6.4 g/dL (ref 6.1–8.1)

## 2017-05-09 MED ORDER — SACUBITRIL-VALSARTAN 49-51 MG PO TABS: 1.0000 | ORAL_TABLET | Freq: Two times a day (BID) | ORAL | 5 refills | Status: DC

## 2017-05-09 NOTE — Addendum Note (Signed)
Addended by: Vonna Kotyk A on: 05/09/2017 10:42 AM   Modules accepted: Orders

## 2017-05-09 NOTE — Progress Notes (Signed)
Subjective:    Patient ID: Anthony Wolfe, male    DOB: 1945-12-06, 71 y.o.   MRN: [id number]  HPI7/21/16 Patient underwent VATS procedure in April for recurrent right pleural effusion. Patient states that ever since that time, he has a persistent daily cough productive of white mucus, waxing and waning shortness of breath, and chest heaviness. He has a significant past medical history including COPD. Patient quit smoking in 1999 there was smoke 3 packs of cigarettes a day for decades prior to that. Also the patient has recently had NSTEMI in October 2015. He is been on an ACE inhibitor and a beta blocker ever since. He denies any hemoptysis. He denies any weight loss. 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  He has been tested on 2 separate occasions for C. difficile colitis both tested been negative.  He has been taking Mucinex 1200 mg a day but this is no longer beneficial. Sunday patient states he felt like he could not catch his breath. He denies any chest pain. He denies any angina.  At that time, my plan was: I am concerned that the patient's symptoms sound like emphysema.  I will screen the patient with pulmonary function test. Also believe that the ACE inhibitor he is taking may be contributing to his cough. Therefore I will discontinue the ACE inhibitor and replace it with losartan 25 mg a day.  Permanent function tests reveal an FEV1 percentage of 74% with an FEV1 of 1.49 L which is 48% of predicted consistent with stage III COPD.  Begin stiolto 2 inhalations daily. Recheck in 2 weeks. Also use albuterol 2 puffs inhaled every 6 hours as needed. Recheck sooner if worse. I did give the patient some Lomotil for diarrhea  03/17/15  patient did very well for 2 weeks on stiolto  And off his ACE inhibitor. However over the last week he is started developing increasing dyspnea on exertion and continues to have a persistent daily cough  productive of white mucus.   He spoke to his oncologist this morning and they believe it could be related to his Sprycell.   After reviewing the literature on this medicine that does report up to 25%  The patient's report dyspnea on this medication and have pleural effusions. He certainly has both. However on examination today he has new left basilar crackles that were not there previously. Also he had been doing better up until last week which makes me think this is a new event exacerbating his underlying issues.  AT that time, my plan was:  while it is possible his medication is causing this I still believe he may have an element of emphysema and I am concerned he may be possibly developing left lower lobe pneumonia or possibly a new left sided pleural effusion. I will obtain a chest x-ray today to evaluate further. If there is evidence of pneumonia, I'll start the patient on antibiotics. If there is a pleural effusion I will start with diabetics. If there is a pleural effusion, he may need to consider stopping Sprycell  Because then the patient would've developed pleural effusions on numerous  Occasions for no reason other than that medication.  05/29/15 Patient ultimately was found to have recurrent pleural effusions secondary to his cancer medication. This was discontinued for 2 weeks by his oncologist, and his shortness of breath dramatically improved. Subsequently he has been started on bosulif.  Patient states that his  dyspnea has returned. He now has dyspnea with minimal exertion. He also reports by basilar pleurisy with deep inspiration. He denies any fevers or chills. He denies any chest congestion. On examination today, his lungs are clear to auscultation bilaterally with no wheezes crackles Rales. He is actually moving a good amount of air compared to his baseline. I do not appreciate any pulmonary abnormality. A stat CBC was obtained my office which shows a white blood cell count 4.7, hemoglobin of  10.5, hematocrit of 32.9. Therefore I do not believe that severe anemia as the cause of his dyspnea.  At that time, my plan was: History and exam does not provide a definitive cause of the shortness of breath. Therefore I would like the patient get a chest x-ray immediately. CBC without anemia. His shortness of breath does not appear to be angina. Congestive heart failure be another possibility. I will await the results of the chest x-ray.  10/06/15 Ultimately work up led to CT scan: 1. Multiple small loculated pleural effusions in the medial, basilar and fissural right pleural space with expected postsurgical changes from prior talc pleurodesis. A small loculated effusion in the inferior right major fissure accounts for the masslike opacity described on the recent chest radiograph. 2. Small layering left pleural effusion, increased since April 2016. 3. Two 4 mm pulmonary nodules, stable for 11 months. Follow-up chest CT is advised in 12 months for this high risk patient. This recommendation follows the consensus statement: Guidelines for Management of Small Pulmonary Nodules Detected on CT Scans: A Statement from the [employer] as published in Radiology 2005; 237:395-400. 4. Mild-to-moderate centrilobular emphysema and diffuse bronchial wall thickening, suggesting COPD. 5. Stable mild chronic mediastinal lymphadenopathy, likely benign. 6. Atherosclerosis, including left main and 3 vessel coronary artery disease status post CABG. 7. New asymmetric soft tissue posterior to the right nipple, likely to represent asymmetric gynecomastia. Clinical correlation is Advised.  Patient followed up with his cardiothoracic surgeon. The effusions were well loculated and were stable and they recommended no treatment at that time. Furthermore his breathing has only improved and at the present time he denies any problems with his breathing. He does not want to try anoro due to the fact that all the  inhaled powder seemed to irritate his lungs more and causing more problems coughing. He denies any chest pain shortness of breath or dyspnea on exertion. He is overdue for a PSA. He is also due for fasting lipid panel. He denies any myalgias or right upper quadrant pain on his statin medication. At the present time, his blood pressure is well controlled. He denies any palpitations or syncope or presyncope.  At that time, my plan was: His blood pressure is adequately controlled. I will make no changes in his medication at this time. I would like to check a fasting lipid panel. His goal LDL cholesterol is less than 70. His shortness of breath is multifactorial however I do believe that emphysema is playing a large part of that. At the present time he is asymptomatic. Should symptoms return, I will try the patient on stiolto with the hope that the inhaled mist would not irritate his breathing is much as the inhaled powder. I will also check a PSA while we are obtaining lab work.  04/05/16 Since I last saw the patient, he suffered severe diarrhea. At that time his oncologist decrease the dose of his Bosulif by 50% and the diarrhea completely resolved. Overall he is been doing exceptionally well since  that time. He states that his energy level has improved. He does complain of some mild dyspnea on exertion. He denies any chest pain. He denies any liver disease. He has stable right pleural effusions. On his exam today, there are faint right basilar crackles that are patient has a history of ischemic cardiomyopathy with an ejection fraction of 40%. Due to his multiple medical problems since his myocardial infarction, I do not see if this is been followed up. Patient would be a good candidate for Entresto if cardiomyopathy persist. He is also due to recheck a fasting lipid panel. He denies any myalgias or right upper quadrant pain.  At that time, my plan was: His blood pressures very high today. However the patient  states this is due to whitecoat syndrome. He would like to check his blood pressure at home for 5 times over the next 48 hours, record these values, and bring them to me on Friday. If the blood pressure is persistently elevated, I will increase his medication to compensate. I will schedule the patient for an echocardiogram. If his ejection fraction is less than 40%, I would discontinue losartan and replace with Entresto.  I will also check a fasting lipid panel. His goal LDL cholesterol is less than 70.  04/20/17  Patient presents with steadily worsening dyspnea on exertion.  Can now only go up 1-2 steps without becoming short-winded.  Reports orthopnea.  Reports chest congestion refractory to mucinex.  Has tried and failed stiolto and symbicort in the past. Never tried anoro. Recently, underwent myocardial perfusion imaging showing no ischemia but did show 81-44% systolic ejection fraction.  At that time, my plan was: Consult pulmonology for second opinion but I feel his dyspnea on exertion is likely due to CHF.  Discontinue losartan and start entresto 24/26 mg pobid and in 2 weeks increase to 49/51 pobid if bp can tolerate.  Reassess here in 3 weeks.  Also obtain CXR to determine if there is any underlying recurrent effusion or pneumonia.  May benefit from high res CT to look for interstitial lung disease if entresto is not benefiical.    05/09/17 Patient states he feels much better since we started him on entresto and Dr. Jens Som started mexitil for VT. his breathing is back to his baseline. He denies any fever or cough. Recently saw the pulmonologist and CT scan showed calcification in his previous pleurodesis site. It also showed COPD which is chronic and a small left pleural effusion which is chronic. However there was no evidence of interstitial lung disease or progression of his underlying lung disease. Therefore his dyspnea on exertion seems to be cardiac in origin and most likely due to his congestive  heart failure. He is tolerating the medication without difficulty. He denies any angioedema. He denies any cough. He denies any hypotensive episodes. Past Medical History:  Diagnosis Date  . AICD (automatic cardioverter/defibrillator) present   . Allergic rhinitis   . 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 Anxiety   . Blood dyscrasia    cmleukemia  . C. difficile colitis 06/16/2014  . CAD (coronary artery disease)    a. s/p 3v CABG 2010. b. NSTEMI 05/2014 in setting of VT. Cath impressions: "Recent IMI with occluded SVG to RCA. Has Right to Right collaterals and collaterals from septal and OM. EF 40%."  . Carotid disease, bilateral ([place])    a. Right CEA 2002; known occluded left carotid. b. Duplex 06/2014: known occluded LICA, stable 8-18% RICA s/p CEA.  Marland Kitchen  Chronic systolic CHF (congestive heart failure) ([place])   . CML (chronic myelocytic leukemia) ([place]) 05/09/2012  . Depression   . Emphysema of lung ([place])   . HTN (hypertension)   . Hyperlipidemia   . Ischemic cardiomyopathy    a. Prior EF 36%. b. 2014: 50%. c. 05/2014: EF 35-40% by echo, 40% with inf HK by cath.  . NSTEMI (non-ST elevation myocardial infarction) ([place]) 05/2014  . Pleural effusion 05/2014  . Pneumonia 05/23/2014  . Presence of permanent cardiac pacemaker   . Prostate hypertrophy   . PVC's (premature ventricular contractions)    a. PVC's with prior Holter showing PVC load of 22%.  . Shortness of breath dyspnea   . Stroke ([place]) 01  . TIA (transient ischemic attack)    ASPVD, S./P. right CEA, 2002, and known occluded left carotid  . Ventricular tachycardia ([place])    a. Admitted with VT 05/2013 - EPS with inducible sustained monomorphic VT; s/p [employer] ICD 05/21/14.   Past Surgical History:  Procedure Laterality Date  . BACK SURGERY    . CAROTID ENDARTERECTOMY Right 01  . carotidectomy  2003   right side  . COLONOSCOPY  07/2010  . CORONARY ARTERY BYPASS GRAFT  02/2009   3 vessel  . ELECTROPHYSIOLOGIC STUDY   05/21/14  . ELECTROPHYSIOLOGY STUDY N/A 05/21/2014   Procedure: ELECTROPHYSIOLOGY STUDY;  Surgeon: Deboraha Sprang, MD;  Location[facility] C[facility] LAB;  Service: Cardiovascular;  Laterality: N/A;  . EP IMPLANTABLE DEVICE  05/21/14   single chamber Metronic ICD  . LEFT HEART CATHETERIZATION WITH CORONARY/GRAFT ANGIOGRAM N/A 05/19/2014   Procedure: LEFT HEART CATHETERIZATION WITH Beatrix Fetters;  Surgeon: Josue Hector, MD;  Location[facility] CATH LAB;  Service: Cardiovascular;  Laterality: N/A;  . PLEURAL BIOPSY N/A 11/27/2014   Procedure: PLEURAL BIOPSY;  Surgeon: [name], MD;  Location: [place];  Service: Thoracic;  Laterality: N/A;  . PLEURAL EFFUSION DRAINAGE Right 11/27/2014   Procedure: DRAINAGE OF PLEURAL EFFUSION;  Surgeon: [name], MD;  Location: [place];  Service: Thoracic;  Laterality: Right;  . TALC PLEURODESIS Right 11/27/2014   Procedure: Pietro Cassis;  Surgeon: [name], MD;  Location: [place];  Service: Thoracic;  Laterality: Right;  Marland Kitchen VIDEO ASSISTED THORACOSCOPY Right 11/27/2014   Procedure: RIGHT VIDEO ASSISTED THORACOSCOPY;  Surgeon: [name], MD;  Location: [place];  Service: Thoracic;  Laterality: Right;   Current Outpatient Prescriptions on File Prior to Visit  Medication Sig Dispense Refill  . aspirin 81 MG chewable tablet Chew 1 tablet (81 mg total) by mouth daily.    Marland Kitchen atorvastatin (LIPITOR) 80 MG tablet TAKE ONE TABLET BY MOUTH ONCE DAILY AT  6PM. 90 tablet 4  . bosutinib (BOSULIF) 500 MG tablet Take 1 tablet (500 mg total) by mouth daily with breakfast. Take with food. 90 tablet 3  . carvedilol (COREG) 6.25 MG tablet Take 1 tablet (6.25 mg total) by mouth 2 (two) times daily with a meal. 180 tablet 3  . clopidogrel (PLAVIX) 75 MG tablet Take 1 tablet (75 mg total) by mouth daily. 90 tablet 3  . diphenoxylate-atropine (LOMOTIL) 2.5-0.025 MG tablet Take 2 tablets by mouth 4 (four) times daily as needed for diarrhea or loose stools.  30 tablet 3  . furosemide (LASIX) 20 MG tablet Take 20 mg by mouth daily as needed for fluid or edema.    Marland Kitchen guaiFENesin (MUCINEX) 600 MG 12 hr tablet Take 1200 mg by mouth 2-3 times a day as  needed for cough or to loosen phlegm    . mexiletine (MEXITIL) 200 MG capsule Take 1 capsule (200 mg total) by mouth 2 (two) times daily. 60 capsule 11  . nitroGLYCERIN (NITROSTAT) 0.4 MG SL tablet DISSOLVE 1 TABLET UNDER THE TONGUE EVERY 5 MINUTES AS NEEDED FOR CHEST PAIN 25 tablet 4  . ondansetron (ZOFRAN) 4 MG tablet Take 1 tablet (4 mg total) by mouth every 8 (eight) hours as needed for nausea or vomiting. 20 tablet 0  . sacubitril-valsartan (ENTRESTO) 49-51 MG Take 1 tablet by mouth 2 (two) times daily.    . sildenafil (VIAGRA) 100 MG tablet Take 0.5-1 tablets (50-100 mg total) by mouth daily as needed for erectile dysfunction. 5 tablet 11   No current facility-administered medications on file prior to visit.    Allergies  Allergen Reactions  . Norvasc [Amlodipine Besylate] Rash    rash  . Sulfa Antibiotics Hives    Hives    Social History   Social History  . Marital status: Married    Spouse name: N/A  . Number of children: 2  . Years of education: N/A   Occupational History  .      retired Development worker, [employer]   Social History Main Topics  . Smoking status: Former Smoker    Packs/day: 3.00    Years: 45.00    Types: Cigarettes    Quit date: 08/08/1997  . Smokeless tobacco: Former Systems developer  . Alcohol use No  . Drug use: No  . Sexual activity: Yes   Other Topics Concern  . Not on file   Social History Narrative  . No narrative on file      Review of Systems  All other systems reviewed and are negative.      Objective:   Physical Exam  Constitutional: He appears well-developed and well-nourished. No distress.  HENT:  Right Ear: External ear normal.  Left Ear: External ear normal.  Nose: Nose normal.  Mouth/Throat: Oropharynx is clear and moist. No oropharyngeal exudate.  Eyes:  Conjunctivae are normal.  Neck: Neck supple.  Cardiovascular: Normal rate, regular rhythm and normal heart sounds.   No murmur heard. Pulmonary/Chest: Effort normal. No respiratory distress. He has no wheezes. He has rales. He exhibits no tenderness.  Abdominal: Soft. Bowel sounds are normal. He exhibits no distension. There is no tenderness. There is no rebound and no guarding.  Lymphadenopathy:    He has no cervical adenopathy.  Skin: He is not diaphoretic.  Vitals reviewed.  Left basilar crackles.       Assessment & Plan:  Cardiomyopathy, ischemic - Plan: COMPLETE METABOLIC PANEL WITH GFR  Dyspnea on exertion - Plan: COMPLETE METABOLIC PANEL WITH GFR  Dyspnea on exertion seems to be cardiac in origin. Continue entresto 49/51 1 tablet by mouth twice a day. Follow up as planned with his cardiologist, Dr. Jens Som, on Friday.  Check electrolytes today since medication was changed to ensure no hyperkalemia. Patient received his flu shot today. I am certainly glad he is feeling better

## 2017-05-11 NOTE — Progress Notes (Signed)
Cardiology Office Note Date:  05/12/2017  Patient ID:  Anthony Wolfe, [name] 1945-12-02, MRN [id number] PCP:  [name], MD  Electrophysiologist:  Dr. Caryl Comes Pulmonary: Dr. Halford Chessman     Chief Complaint: planned f/u  History of Present Illness: Anthony Wolfe is a 71 y.o. male with history of VT (+EP inducible VT) w/ICD, CAD (CABG), PVD (R CEA, known occluded L ICA), chronic CHF (systolic), ICM, CML, emphysema,  HTN, HLD, CVA.  He comes today to be seen for Dr. Caryl Comes.  Last seen by him on 04/12/17, at that visit found to have had VT mislabeled as SVT by his device, started mexiletine (mentioning intolerant of amio) and planned for stress testing to evaluate for ischemia.  Saw PMD 05/09/17 with much improvement of his DOE, on the mexiletine and entresto.  Stress test was neg for ischemia, EF 35%,  CT angio/scan with chronic pulm findings    Since the couple changes in his medicines he has felt 100% better.  He denies any kind of SOB, no rest SOB or DOE.  He had a VT event 04/15/16, he recalls the day well reports he had been doing some particularly strenuous work and felt well that day without dizziness, near syncope or syncope.  He denies any kind of CP, no palpitations.  He is very happy with how he is feeling since the medicine adjustments.  His PMD started Entresto, discussed plans for titration and follow up, it is very expensive but they are working with the pharmacy and company for assistance availability, and has been getting samples.  Device information: MDT single chamber ICD, implanted 05/21/14, Dr. Caryl Comes   Past Medical History:  Diagnosis Date  . AICD (automatic cardioverter/defibrillator) present   . Allergic rhinitis   . 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 Anxiety   . Blood dyscrasia    cmleukemia  . C. difficile colitis 06/16/2014  . CAD (coronary artery disease)    a. s/p 3v CABG 2010. b. NSTEMI 05/2014 in setting of VT. Cath impressions: "Recent IMI with occluded  SVG to RCA. Has Right to Right collaterals and collaterals from septal and OM. EF 40%."  . Carotid disease, bilateral ([place])    a. Right CEA 2002; known occluded left carotid. b. Duplex 06/2014: known occluded LICA, stable 1-02% RICA s/p CEA.  . Chronic systolic CHF (congestive heart failure) ([place])   . CML (chronic myelocytic leukemia) ([place]) 05/09/2012  . Depression   . Emphysema of lung ([place])   . HTN (hypertension)   . Hyperlipidemia   . Ischemic cardiomyopathy    a. Prior EF 36%. b. 2014: 50%. c. 05/2014: EF 35-40% by echo, 40% with inf HK by cath.  . NSTEMI (non-ST elevation myocardial infarction) ([place]) 05/2014  . Pleural effusion 05/2014  . Pneumonia 05/23/2014  . Presence of permanent cardiac pacemaker   . Prostate hypertrophy   . PVC's (premature ventricular contractions)    a. PVC's with prior Holter showing PVC load of 22%.  . Shortness of breath dyspnea   . Stroke ([place]) 01  . TIA (transient ischemic attack)    ASPVD, S./P. right CEA, 2002, and known occluded left carotid  . Ventricular tachycardia ([place])    a. Admitted with VT 05/2013 - EPS with inducible sustained monomorphic VT; s/p Medtronic ICD 05/21/14.    Past Surgical History:  Procedure Laterality Date  . BACK SURGERY    . CAROTID ENDARTERECTOMY Right 01  . carotidectomy  2003   right side  .  COLONOSCOPY  07/2010  . CORONARY ARTERY BYPASS GRAFT  02/2009   3 vessel  . ELECTROPHYSIOLOGIC STUDY  05/21/14  . ELECTROPHYSIOLOGY STUDY N/A 05/21/2014   Procedure: ELECTROPHYSIOLOGY STUDY;  Surgeon: Deboraha Sprang, MD;  Location: [facility] CATH LAB;  Service: Cardiovascular;  Laterality: N/A;  . EP IMPLANTABLE DEVICE  05/21/14   single chamber Metronic ICD  . LEFT HEART CATHETERIZATION WITH CORONARY/GRAFT ANGIOGRAM N/A 05/19/2014   Procedure: LEFT HEART CATHETERIZATION WITH Beatrix Fetters;  Surgeon: Josue Hector, MD;  Location: [facility] CATH LAB;  Service: Cardiovascular;  Laterality: N/A;  . PLEURAL BIOPSY N/A 11/27/2014     Procedure: PLEURAL BIOPSY;  Surgeon: [name], MD;  Location: [place];  Service: Thoracic;  Laterality: N/A;  . PLEURAL EFFUSION DRAINAGE Right 11/27/2014   Procedure: DRAINAGE OF PLEURAL EFFUSION;  Surgeon: [name], MD;  Location: [place];  Service: Thoracic;  Laterality: Right;  . TALC PLEURODESIS Right 11/27/2014   Procedure: Pietro Cassis;  Surgeon: [name], MD;  Location: [place];  Service: Thoracic;  Laterality: Right;  Marland Kitchen VIDEO ASSISTED THORACOSCOPY Right 11/27/2014   Procedure: RIGHT VIDEO ASSISTED THORACOSCOPY;  Surgeon: [name], MD;  Location: [place];  Service: Thoracic;  Laterality: Right;    Current Outpatient Prescriptions  Medication Sig Dispense Refill  . aspirin 81 MG chewable tablet Chew 1 tablet (81 mg total) by mouth daily.    Marland Kitchen atorvastatin (LIPITOR) 80 MG tablet TAKE ONE TABLET BY MOUTH ONCE DAILY AT  6PM. 90 tablet 4  . bosutinib (BOSULIF) 500 MG tablet Take 1 tablet (500 mg total) by mouth daily with breakfast. Take with food. 90 tablet 3  . carvedilol (COREG) 6.25 MG tablet Take 1 tablet (6.25 mg total) by mouth 2 (two) times daily with a meal. 180 tablet 3  . clopidogrel (PLAVIX) 75 MG tablet Take 1 tablet (75 mg total) by mouth daily. 90 tablet 3  . diphenoxylate-atropine (LOMOTIL) 2.5-0.025 MG tablet Take 2 tablets by mouth 4 (four) times daily as needed for diarrhea or loose stools. 30 tablet 3  . furosemide (LASIX) 20 MG tablet Take 20 mg by mouth daily as needed for fluid or edema.    Marland Kitchen guaiFENesin (MUCINEX) 600 MG 12 hr tablet Take 1200 mg by mouth 2-3 times a day as needed for cough or to loosen phlegm    . mexiletine (MEXITIL) 200 MG capsule Take 1 capsule (200 mg total) by mouth 2 (two) times daily. 60 capsule 11  . nitroGLYCERIN (NITROSTAT) 0.4 MG SL tablet DISSOLVE 1 TABLET UNDER THE TONGUE EVERY 5 MINUTES AS NEEDED FOR CHEST PAIN 25 tablet 4  . ondansetron (ZOFRAN) 4 MG tablet Take 1 tablet (4 mg total) by mouth  every 8 (eight) hours as needed for nausea or vomiting. 20 tablet 0  . sacubitril-valsartan (ENTRESTO) 49-51 MG Take 1 tablet by mouth 2 (two) times daily. 60 tablet 5  . sildenafil (VIAGRA) 100 MG tablet Take 0.5-1 tablets (50-100 mg total) by mouth daily as needed for erectile dysfunction. 5 tablet 11   No current facility-administered medications for this visit.     Allergies:   Norvasc [amlodipine besylate] and Sulfa antibiotics   Social History:  The patient  reports that he quit smoking about 19 years ago. His smoking use included Cigarettes. He has a 135.00 pack-year smoking history. He has quit using smokeless tobacco. He reports that he does not drink alcohol or use drugs.   Family History:  The patient's family  history includes Alcohol abuse in his father; Heart disease in his mother.  ROS:  Please see the history of present illness.  All other systems are reviewed and otherwise negative.   PHYSICAL EXAM:  VS:  BP (!) 144/70   Ht 5\' 9"  (1.753 m)   Wt 186 lb (84.4 kg)   BMI 27.47 kg/m  BMI: Body mass index is 27.47 kg/m. Well nourished, well developed, in no acute distress  HEENT: normocephalic, atraumatic  Neck: no JVD, carotid bruits or masses Cardiac:  RRR; no significant murmurs, no rubs, or gallops Lungs:  CTA b/l, no wheezing, rhonchi or rales  Abd: soft, nontender MS: no deformity or atrophy Ext:  no edema  Skin: warm and dry, no rash Neuro:  No gross deficits appreciated Psych: euthymic mood, full affect   ICD site is stable, no tethering or discomfort   EKG:   Not done today ICD interrogation done today and reviewed by myself: battery and lead measurements are good.  VT episode 04/15/16 x2, ATP successful on one, ATP then ramp had dirty break, no shocks.  Since then he has had only a few NSVT episodes of a couple seconds.  OptiVol looks OK, below threshold  04/13/17: lexiscan stress test  Nuclear stress EF: 35%.  Inferior/inferoseptal defect consistent iwht  scar No significant ischemia  Intermediate risk study  06/07/16: TTE Study Conclusions - Left ventricle: Global longitudinal LV strain is abnormal at -12%   The cavity size was normal. There was moderate focal basal and   mild concentric hypertrophy. Systolic function was mildly   reduced. The estimated ejection fraction was in the range of 45%   to 50%. There is akinesis of the basal-midinferior myocardium. - Aortic valve: Mildly to moderately calcified annulus. Trileaflet;   mildly thickened, mildly calcified leaflets. - Aorta: Aortic root dimension: 38 mm (ED). - Aortic root: The aortic root was mildly dilated. - Mitral valve: There was trivial regurgitation. - Left atrium: The atrium was mildly to moderately dilated. - Right ventricle: The cavity size was mildly dilated. Wall   thickness was normal. - Tricuspid valve: There was trivial regurgitation.   11/14/16: Carotid US IMPRESSION: 1. Surgical changes of prior right internal carotid endarterectomy with mild residual heterogeneous atherosclerotic plaque. By peak systolic velocity criteria, the estimated stenosis falls in the 1 - 49% range. 2. 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 3. The vertebral arteries are patent with normal antegrade flow bilaterally.    Recent Labs: 12/06/2016: HGB 11.6; Platelets 157 03/15/2017: TSH 2.510 04/12/2017: Magnesium 2.3 05/09/2017: ALT 5; BUN 12; Creat 1.38; Potassium 3.9; Sodium 142  03/15/2017: Chol/HDL Ratio 3.3; Cholesterol, Total 102; HDL 31; LDL Calculated 54; Triglycerides 87   Estimated Creatinine Clearance: 47.7 mL/min (A) (by C-G formula based on SCr of 1.38 mg/dL (H)).   Wt Readings from Last 3 Encounters:  05/12/17 186 lb (84.4 kg)  05/09/17 188 lb (85.3 kg)  04/26/17 187 lb 3.2 oz (84.9 kg)     Other studies reviewed: Additional studies/records reviewed today include:  summarized above  ASSESSMENT AND PLAN:  1. ICD     No inappropriately labed episodes, morpholgy updated.  2. VT     On mexiletine     He had VT only 3 days after seeing Dr. Caryl Comes, and had only been on the mexiletine maybe 2 days     No changes for now  He is made aware of [place]  law, no driving 6 month sgiven treated VT episode (though was asymptomatic)  3. ICM, chronic CHF (systolic)     His SOB has resolved.  Fluid status by exam and OptiVol look OK     DOE likely multifactorial, though the addition of Entrsto seems to have made a significant improvement     He will continue plans as planned with his PMD for titration, BP seems to be tolerating     On BB/ARB, diuretic tx  4. CAD     No ischemia on stress testing     No anginal complaints     Remains on ASA/Plavix with CAD/PVD, BB, statin tx  5. HTN     Looks OK, no changes with PMD plans to continue Entresto titration    Disposition: F/u 6-8 weeks, if VT remains quiet will push out follow up   Current medicines are reviewed at length with the patient today.  The patient did not have any concerns regarding medicines.  Venetia Night, PA-C 05/12/2017 9:24 AM     [address] [place] [address] [phone] (office)  [phone] (fax)

## 2017-05-12 ENCOUNTER — Telehealth: Payer: Self-pay

## 2017-05-12 ENCOUNTER — Ambulatory Visit (INDEPENDENT_AMBULATORY_CARE_PROVIDER_SITE_OTHER): Payer: Medicare HMO | Admitting: Physician Assistant

## 2017-05-12 VITALS — BP 144/70 | Ht 69.0 in | Wt 186.0 lb

## 2017-05-12 DIAGNOSIS — I472 Ventricular tachycardia, unspecified: Secondary | ICD-10-CM

## 2017-05-12 DIAGNOSIS — I2510 Atherosclerotic heart disease of native coronary artery without angina pectoris: Secondary | ICD-10-CM

## 2017-05-12 DIAGNOSIS — Z95810 Presence of automatic (implantable) cardiac defibrillator: Secondary | ICD-10-CM | POA: Diagnosis not present

## 2017-05-12 DIAGNOSIS — I255 Ischemic cardiomyopathy: Secondary | ICD-10-CM | POA: Diagnosis not present

## 2017-05-12 DIAGNOSIS — I10 Essential (primary) hypertension: Secondary | ICD-10-CM | POA: Diagnosis not present

## 2017-05-12 NOTE — Patient Instructions (Signed)
Medication Instructions:   Your physician recommends that you continue on your current medications as directed. Please refer to the Current Medication list given to you today.   If you need a refill on your cardiac medications before your next appointment, please call your pharmacy.  Labwork: NONE ORDERED  TODAY    Testing/Procedures: NONE ORDERED  TODAY    Follow-Up: IN 6 TO 8 WEEKS WITH RENEE URSUY  Any Other Special Instructions Will Be Listed Below (If Applicable).

## 2017-05-12 NOTE — Telephone Encounter (Signed)
Prior authorization started for [employer] 49-51MG  OR TABS  Form [employer] Electronic PA Form

## 2017-05-18 NOTE — Telephone Encounter (Signed)
Prior authorization was denied 05/12/2017.I have appealed the denial

## 2017-05-24 ENCOUNTER — Ambulatory Visit: Payer: Medicare HMO | Admitting: Adult Health

## 2017-05-25 ENCOUNTER — Telehealth: Payer: Self-pay | Admitting: Family Medicine

## 2017-05-25 NOTE — Telephone Encounter (Signed)
Patient has been taking entresto and is giving him severe dirreah  Please give him a call at [phone]

## 2017-05-26 NOTE — Telephone Encounter (Signed)
I would try resuming entresto next week and see if diarrhea comes back.  It may not.  Could have been coincidence.

## 2017-05-26 NOTE — Telephone Encounter (Signed)
Pt's wife aware of recommendations 

## 2017-05-26 NOTE — Telephone Encounter (Signed)
Pt's wife aware of below however she states that he stopped taking the medication 2 days ago and his diarrhea has stopped. He was having the diarrhea even taking the diarrhea medication. Do you want to put him on something else - per wife?

## 2017-05-26 NOTE — Telephone Encounter (Signed)
There is no FDA warning or report about entresto causing diarrhea.  I do not feel the diarrhea is due to the entresto.  Something else is causing it.

## 2017-05-30 MED ORDER — SACUBITRIL-VALSARTAN 49-51 MG PO TABS: 1.0000 | ORAL_TABLET | Freq: Two times a day (BID) | ORAL | 5 refills | Status: DC

## 2017-05-30 NOTE — Telephone Encounter (Signed)
Delene Loll has been approved 05/25/2017-05/26/2019. Pharmacy aware

## 2017-06-02 ENCOUNTER — Encounter: Payer: Self-pay | Admitting: Family Medicine

## 2017-06-02 ENCOUNTER — Ambulatory Visit (INDEPENDENT_AMBULATORY_CARE_PROVIDER_SITE_OTHER): Payer: Medicare HMO | Admitting: Family Medicine

## 2017-06-02 VITALS — BP 140/70 | HR 74 | Temp 98.2°F | Resp 16 | Ht 69.0 in | Wt 190.0 lb

## 2017-06-02 DIAGNOSIS — R197 Diarrhea, unspecified: Secondary | ICD-10-CM | POA: Diagnosis not present

## 2017-06-02 NOTE — Progress Notes (Signed)
Subjective:    Patient ID: Anthony Wolfe, male    DOB: Dec 09, 1945, 71 y.o.   MRN: [id number]  HPI7/21/16 Patient underwent VATS procedure in April for recurrent right pleural effusion. Patient states that ever since that time, he has a persistent daily cough productive of white mucus, waxing and waning shortness of breath, and chest heaviness. He has a significant past medical history including COPD. Patient quit smoking in 1999 there was smoke 3 packs of cigarettes a day for decades prior to that. Also the patient has recently had NSTEMI in October 2015. He is been on an ACE inhibitor and a beta blocker ever since. He denies any hemoptysis. He denies any weight loss. 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  He has been tested on 2 separate occasions for C. difficile colitis both tested been negative.  He has been taking Mucinex 1200 mg a day but this is no longer beneficial. Sunday patient states he felt like he could not catch his breath. He denies any chest pain. He denies any angina.  At that time, my plan was: I am concerned that the patient's symptoms sound like emphysema.  I will screen the patient with pulmonary function test. Also believe that the ACE inhibitor he is taking may be contributing to his cough. Therefore I will discontinue the ACE inhibitor and replace it with losartan 25 mg a day.  Permanent function tests reveal an FEV1 percentage of 74% with an FEV1 of 1.49 L which is 48% of predicted consistent with stage III COPD.  Begin stiolto 2 inhalations daily. Recheck in 2 weeks. Also use albuterol 2 puffs inhaled every 6 hours as needed. Recheck sooner if worse. I did give the patient some Lomotil for diarrhea  03/17/15  patient did very well for 2 weeks on stiolto  And off his ACE inhibitor. However over the last week he is started developing increasing dyspnea on exertion and continues to have a  persistent daily cough productive of white mucus.   He spoke to his oncologist this morning and they believe it could be related to his Sprycell.   After reviewing the literature on this medicine that does report up to 25%  The patient's report dyspnea on this medication and have pleural effusions. He certainly has both. However on examination today he has new left basilar crackles that were not there previously. Also he had been doing better up until last week which makes me think this is a new event exacerbating his underlying issues.  AT that time, my plan was:  while it is possible his medication is causing this I still believe he may have an element of emphysema and I am concerned he may be possibly developing left lower lobe pneumonia or possibly a new left sided pleural effusion. I will obtain a chest x-ray today to evaluate further. If there is evidence of pneumonia, I'll start the patient on antibiotics. If there is a pleural effusion I will start with diabetics. If there is a pleural effusion, he may need to consider stopping Sprycell  Because then the patient would've developed pleural effusions on numerous  Occasions for no reason other than that medication.  05/29/15 Patient ultimately was found to have recurrent pleural effusions secondary to his cancer medication. This was discontinued for 2 weeks by his oncologist, and his shortness of breath dramatically improved. Subsequently he has been  started on bosulif.  Patient states that his dyspnea has returned. He now has dyspnea with minimal exertion. He also reports by basilar pleurisy with deep inspiration. He denies any fevers or chills. He denies any chest congestion. On examination today, his lungs are clear to auscultation bilaterally with no wheezes crackles Rales. He is actually moving a good amount of air compared to his baseline. I do not appreciate any pulmonary abnormality. A stat CBC was obtained my office which shows a white blood cell  count 4.7, hemoglobin of 10.5, hematocrit of 32.9. Therefore I do not believe that severe anemia as the cause of his dyspnea.  At that time, my plan was: History and exam does not provide a definitive cause of the shortness of breath. Therefore I would like the patient get a chest x-ray immediately. CBC without anemia. His shortness of breath does not appear to be angina. Congestive heart failure be another possibility. I will await the results of the chest x-ray.  10/06/15 Ultimately work up led to CT scan: 1. Multiple small loculated pleural effusions in the medial, basilar and fissural right pleural space with expected postsurgical changes from prior talc pleurodesis. A small loculated effusion in the inferior right major fissure accounts for the masslike opacity described on the recent chest radiograph. 2. Small layering left pleural effusion, increased since April 2016. 3. Two 4 mm pulmonary nodules, stable for 11 months. Follow-up chest CT is advised in 12 months for this high risk patient. This recommendation follows the consensus statement: Guidelines for Management of Small Pulmonary Nodules Detected on CT Scans: A Statement from the [place] as published in Radiology 2005; 237:395-400. 4. Mild-to-moderate centrilobular emphysema and diffuse bronchial wall thickening, suggesting COPD. 5. Stable mild chronic mediastinal lymphadenopathy, likely benign. 6. Atherosclerosis, including left main and 3 vessel coronary artery disease status post CABG. 7. New asymmetric soft tissue posterior to the right nipple, likely to represent asymmetric gynecomastia. Clinical correlation is Advised.  Patient followed up with his cardiothoracic surgeon. The effusions were well loculated and were stable and they recommended no treatment at that time. Furthermore his breathing has only improved and at the present time he denies any problems with his breathing. He does not want to try anoro due  to the fact that all the inhaled powder seemed to irritate his lungs more and causing more problems coughing. He denies any chest pain shortness of breath or dyspnea on exertion. He is overdue for a PSA. He is also due for fasting lipid panel. He denies any myalgias or right upper quadrant pain on his statin medication. At the present time, his blood pressure is well controlled. He denies any palpitations or syncope or presyncope.  At that time, my plan was: His blood pressure is adequately controlled. I will make no changes in his medication at this time. I would like to check a fasting lipid panel. His goal LDL cholesterol is less than 70. His shortness of breath is multifactorial however I do believe that emphysema is playing a large part of that. At the present time he is asymptomatic. Should symptoms return, I will try the patient on stiolto with the hope that the inhaled mist would not irritate his breathing is much as the inhaled powder. I will also check a PSA while we are obtaining lab work.  04/05/16 Since I last saw the patient, he suffered severe diarrhea. At that time his oncologist decrease the dose of his Bosulif by 50% and the diarrhea completely resolved.  Overall he is been doing exceptionally well since that time. He states that his energy level has improved. He does complain of some mild dyspnea on exertion. He denies any chest pain. He denies any liver disease. He has stable right pleural effusions. On his exam today, there are faint right basilar crackles that are patient has a history of ischemic cardiomyopathy with an ejection fraction of 40%. Due to his multiple medical problems since his myocardial infarction, I do not see if this is been followed up. Patient would be a good candidate for Entresto if cardiomyopathy persist. He is also due to recheck a fasting lipid panel. He denies any myalgias or right upper quadrant pain.  At that time, my plan was: His blood pressures very high today.  However the patient states this is due to whitecoat syndrome. He would like to check his blood pressure at home for 5 times over the next 48 hours, record these values, and bring them to me on Friday. If the blood pressure is persistently elevated, I will increase his medication to compensate. I will schedule the patient for an echocardiogram. If his ejection fraction is less than 40%, I would discontinue losartan and replace with Entresto.  I will also check a fasting lipid panel. His goal LDL cholesterol is less than 70.  04/20/17  Patient presents with steadily worsening dyspnea on exertion.  Can now only go up 1-2 steps without becoming short-winded.  Reports orthopnea.  Reports chest congestion refractory to mucinex.  Has tried and failed stiolto and symbicort in the past. Never tried anoro. Recently, underwent myocardial perfusion imaging showing no ischemia but did show 83-38% systolic ejection fraction.  At that time, my plan was: Consult pulmonology for second opinion but I feel his dyspnea on exertion is likely due to CHF.  Discontinue losartan and start entresto 24/26 mg pobid and in 2 weeks increase to 49/51 pobid if bp can tolerate.  Reassess here in 3 weeks.  Also obtain CXR to determine if there is any underlying recurrent effusion or pneumonia.  May benefit from high res CT to look for interstitial lung disease if entresto is not benefiical.    05/09/17 Patient states he feels much better since we started him on entresto and Dr. Jens Som started mexitil for VT. his breathing is back to his baseline. He denies any fever or cough. Recently saw the pulmonologist and CT scan showed calcification in his previous pleurodesis site. It also showed COPD which is chronic and a small left pleural effusion which is chronic. However there was no evidence of interstitial lung disease or progression of his underlying lung disease. Therefore his dyspnea on exertion seems to be cardiac in origin and most likely  due to his congestive heart failure. He is tolerating the medication without difficulty. He denies any angioedema. He denies any cough. He denies any hypotensive episodes.  At that time, my plan was: Dyspnea on exertion seems to be cardiac in origin. Continue entresto 49/51 1 tablet by mouth twice a day. Follow up as planned with his cardiologist, Dr. Jens Som, on Friday.  Check electrolytes today since medication was changed to ensure no hyperkalemia. Patient received his flu shot today. I am certainly glad he is feeling better  06/02/17 Patient continues to do well from a respiratory standpoint ever since starting entresto.  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  I have been very impressed with his response to the medication.  However he called recently stating that he believes is giving him diarrhea.  Past medical history is significant for this patient.  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  Since that time he has had intermittent diarrhea that is severe and unresponsive to Lomotil.  However this is also occurred concomitantly with treatment of his CML leukemia first with Sprycel and now with bosulif.  Recently his wife discontinued entresto and bosulif for a total of 4 days and the diarrhea improved.  She then resumed the medications together and the diarrhea returned.  He is taking Lomotil 2-6 tablets a day and still having breakthrough diarrhea. Past Medical History:  Diagnosis Date  . AICD (automatic cardioverter/defibrillator) present   . Allergic rhinitis   . 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 Anxiety   . Blood dyscrasia    cmleukemia  . C. difficile colitis 06/16/2014  . CAD (coronary artery disease)    a. s/p 3v CABG 2010. b. NSTEMI 05/2014 in setting of VT. Cath impressions: "Recent IMI with occluded SVG to RCA. Has Right to Right  collaterals and collaterals from septal and OM. EF 40%."  . Carotid disease, bilateral ([place])    a. Right CEA 2002; known occluded left carotid. b. Duplex 06/2014: known occluded LICA, stable 5-64% RICA s/p CEA.  . Chronic systolic CHF (congestive heart failure) ([place])   . CML (chronic myelocytic leukemia) ([place]) 05/09/2012  . Depression   . Emphysema of lung ([place])   . HTN (hypertension)   . Hyperlipidemia   . Ischemic cardiomyopathy    a. Prior EF 36%. b. 2014: 50%. c. 05/2014: EF 35-40% by echo, 40% with inf HK by cath.  . NSTEMI (non-ST elevation myocardial infarction) ([place]) 05/2014  . Pleural effusion 05/2014  . Pneumonia 05/23/2014  . Presence of permanent cardiac pacemaker   . Prostate hypertrophy   . PVC's (premature ventricular contractions)    a. PVC's with prior Holter showing PVC load of 22%.  . Shortness of breath dyspnea   . Stroke ([place]) 01  . TIA (transient ischemic attack)    ASPVD, S./P. right CEA, 2002, and known occluded left carotid  . Ventricular tachycardia ([place])    a. Admitted with VT 05/2013 - EPS with inducible sustained monomorphic VT; s/p [employer] ICD 05/21/14.   Past Surgical History:  Procedure Laterality Date  . BACK SURGERY    . CAROTID ENDARTERECTOMY Right 01  . carotidectomy  2003   right side  . COLONOSCOPY  07/2010  . CORONARY ARTERY BYPASS GRAFT  02/2009   3 vessel  . ELECTROPHYSIOLOGIC STUDY  05/21/14  . ELECTROPHYSIOLOGY STUDY N/A 05/21/2014   Procedure: ELECTROPHYSIOLOGY STUDY;  Surgeon: Deboraha Sprang, MD;  Location: [facility] CATH LAB;  Service: Cardiovascular;  Laterality: N/A;  . EP IMPLANTABLE DEVICE  05/21/14   single chamber Metronic ICD  . LEFT HEART CATHETERIZATION WITH CORONARY/GRAFT ANGIOGRAM N/A 05/19/2014   Procedure: LEFT HEART CATHETERIZATION WITH Beatrix Fetters;  Surgeon: Josue Hector, MD;  Location: [facility] CATH LAB;  Service: Cardiovascular;  Laterality: N/A;  . PLEURAL BIOPSY N/A 11/27/2014   Procedure: PLEURAL BIOPSY;   Surgeon: [name], MD;  Location: [place];  Service: Thoracic;  Laterality: N/A;  . PLEURAL EFFUSION DRAINAGE Right 11/27/2014   Procedure: DRAINAGE OF PLEURAL EFFUSION;  Surgeon: [name], MD;  Location: [place];  Service: Thoracic;  Laterality: Right;  . TALC  PLEURODESIS Right 11/27/2014   Procedure: Pietro Cassis;  Surgeon: [name], MD;  Location: [place];  Service: Thoracic;  Laterality: Right;  Marland Kitchen VIDEO ASSISTED THORACOSCOPY Right 11/27/2014   Procedure: RIGHT VIDEO ASSISTED THORACOSCOPY;  Surgeon: [name], MD;  Location: [place];  Service: Thoracic;  Laterality: Right;   Current Outpatient Prescriptions on File Prior to Visit  Medication Sig Dispense Refill  . aspirin 81 MG chewable tablet Chew 1 tablet (81 mg total) by mouth daily.    Marland Kitchen atorvastatin (LIPITOR) 80 MG tablet TAKE ONE TABLET BY MOUTH ONCE DAILY AT  6PM. 90 tablet 4  . bosutinib (BOSULIF) 500 MG tablet Take 1 tablet (500 mg total) by mouth daily with breakfast. Take with food. 90 tablet 3  . carvedilol (COREG) 6.25 MG tablet Take 1 tablet (6.25 mg total) by mouth 2 (two) times daily with a meal. 180 tablet 3  . clopidogrel (PLAVIX) 75 MG tablet Take 1 tablet (75 mg total) by mouth daily. 90 tablet 3  . diphenoxylate-atropine (LOMOTIL) 2.5-0.025 MG tablet Take 2 tablets by mouth 4 (four) times daily as needed for diarrhea or loose stools. 30 tablet 3  . furosemide (LASIX) 20 MG tablet Take 20 mg by mouth daily as needed for fluid or edema.    Marland Kitchen guaiFENesin (MUCINEX) 600 MG 12 hr tablet Take 1200 mg by mouth 2-3 times a day as needed for cough or to loosen phlegm    . mexiletine (MEXITIL) 200 MG capsule Take 1 capsule (200 mg total) by mouth 2 (two) times daily. 60 capsule 11  . nitroGLYCERIN (NITROSTAT) 0.4 MG SL tablet DISSOLVE 1 TABLET UNDER THE TONGUE EVERY 5 MINUTES AS NEEDED FOR CHEST PAIN 25 tablet 4  . ondansetron (ZOFRAN) 4 MG tablet Take 1 tablet (4 mg total) by mouth every 8  (eight) hours as needed for nausea or vomiting. 20 tablet 0  . sacubitril-valsartan (ENTRESTO) 49-51 MG Take 1 tablet by mouth 2 (two) times daily. 60 tablet 5  . sildenafil (VIAGRA) 100 MG tablet Take 0.5-1 tablets (50-100 mg total) by mouth daily as needed for erectile dysfunction. 5 tablet 11   No current facility-administered medications on file prior to visit.    Allergies  Allergen Reactions  . Norvasc [Amlodipine Besylate] Rash    rash  . Sulfa Antibiotics Hives    Hives    Social History   Social History  . Marital status: Married    Spouse name: N/A  . Number of children: 2  . Years of education: N/A   Occupational History  .      retired Development worker, [employer]   Social History Main Topics  . Smoking status: Former Smoker    Packs/day: 3.00    Years: 45.00    Types: Cigarettes    Quit date: 08/08/1997  . Smokeless tobacco: Former Systems developer  . Alcohol use No  . Drug use: No  . Sexual activity: Yes   Other Topics Concern  . Not on file   Social History Narrative  . No narrative on file      Review of Systems  All other systems reviewed and are negative.      Objective:   Physical Exam  Constitutional: He appears well-developed and well-nourished. No distress.  HENT:  Right Ear: External ear normal.  Left Ear: External ear normal.  Nose: Nose normal.  Mouth/Throat: Oropharynx is clear and moist. No oropharyngeal exudate.  Eyes: Conjunctivae are normal.  Neck: Neck supple.  Cardiovascular: Normal  rate, regular rhythm and normal heart sounds.   No murmur heard. Pulmonary/Chest: Effort normal. No respiratory distress. He has no wheezes. He has rales. He exhibits no tenderness.  Abdominal: Soft. Bowel sounds are normal. He exhibits no distension. There is no tenderness. There is no rebound and no guarding.  Lymphadenopathy:    He has no cervical adenopathy.  Skin: He is not diaphoretic.  Vitals reviewed.       Assessment & Plan:  Diarrhea, unspecified  type  I explained to the patient that I see no reports of diarrhea in the literature for Entresto.  I believe it is most likely his cancer medication, bosulif.  He has been in remission now for 18 months and his report is that the tentative plan is to discontinue the medication in 2 weeks when he follows up with the oncologist.  I recommended that they call the oncologist and see if it is okay for him to go ahead and stop it now given that there is only 2 weeks left of treatment as I believe it is the most likely source of his diarrhea.  I encouraged him to continue to remain on the [facility] given his dramatic benefit and response

## 2017-06-04 ENCOUNTER — Other Ambulatory Visit: Payer: Self-pay | Admitting: Hematology and Oncology

## 2017-06-04 DIAGNOSIS — R197 Diarrhea, unspecified: Secondary | ICD-10-CM

## 2017-06-05 MED ORDER — DIPHENOXYLATE-ATROPINE 2.5-0.025 MG PO TABS: 2.0000 | ORAL_TABLET | Freq: Four times a day (QID) | ORAL | 3 refills | Status: DC | PRN

## 2017-06-05 NOTE — Telephone Encounter (Signed)
Called he is still using Rx.

## 2017-06-05 NOTE — Telephone Encounter (Signed)
Can you ask if he still needs it?

## 2017-06-08 ENCOUNTER — Other Ambulatory Visit (HOSPITAL_BASED_OUTPATIENT_CLINIC_OR_DEPARTMENT_OTHER): Payer: Medicare HMO

## 2017-06-08 DIAGNOSIS — C9210 Chronic myeloid leukemia, BCR/ABL-positive, not having achieved remission: Secondary | ICD-10-CM

## 2017-06-08 LAB — COMPREHENSIVE METABOLIC PANEL
ALBUMIN: 3.4 g/dL — AB (ref 3.5–5.0)
ALK PHOS: 96 U/L (ref 40–150)
ALT: 7 U/L (ref 0–55)
ANION GAP: 7 meq/L (ref 3–11)
AST: 13 U/L (ref 5–34)
BUN: 11.6 mg/dL (ref 7.0–26.0)
CALCIUM: 8.6 mg/dL (ref 8.4–10.4)
CHLORIDE: 109 meq/L (ref 98–109)
CO2: 25 mEq/L (ref 22–29)
CREATININE: 0.9 mg/dL (ref 0.7–1.3)
EGFR: >60 mL/min/{1.73_m2} (ref >60)
Glucose: 98 mg/dl (ref 70–140)
POTASSIUM: 3.9 meq/L (ref 3.5–5.1)
Sodium: 142 mEq/L (ref 136–145)
Total Bilirubin: 0.44 mg/dL (ref 0.20–1.20)
Total Protein: 6.7 g/dL (ref 6.4–8.3)

## 2017-06-08 LAB — CBC WITH DIFFERENTIAL/PLATELET
BASO%: 1 % (ref 0.0–2.0)
BASOS ABS: 0.1 10*3/uL (ref 0.0–0.1)
EOS ABS: 0.2 10*3/uL (ref 0.0–0.5)
EOS%: 4.6 % (ref 0.0–7.0)
HEMATOCRIT: 35.9 % — AB (ref 38.4–49.9)
HEMOGLOBIN: 11.5 g/dL — AB (ref 13.0–17.1)
LYMPH#: 0.8 10*3/uL — AB (ref 0.9–3.3)
LYMPH%: 16.6 % (ref 14.0–49.0)
MCH: 29.9 pg (ref 27.2–33.4)
MCHC: 32 g/dL (ref 32.0–36.0)
MCV: 93.2 fL (ref 79.3–98.0)
MONO#: 0.5 10*3/uL (ref 0.1–0.9)
MONO%: 10.8 % (ref 0.0–14.0)
NEUT#: 3.4 10*3/uL (ref 1.5–6.5)
NEUT%: 67 % (ref 39.0–75.0)
PLATELETS: 192 10*3/uL (ref 140–400)
RBC: 3.85 10*6/uL — ABNORMAL LOW (ref 4.20–5.82)
RDW: 15.7 % — AB (ref 11.0–14.6)
WBC: 5 10*3/uL (ref 4.0–10.3)

## 2017-06-15 ENCOUNTER — Ambulatory Visit: Payer: Medicare HMO | Admitting: Hematology and Oncology

## 2017-06-15 ENCOUNTER — Encounter: Payer: Self-pay | Admitting: Hematology and Oncology

## 2017-06-15 ENCOUNTER — Telehealth: Payer: Self-pay | Admitting: Pharmacy Technician

## 2017-06-15 ENCOUNTER — Telehealth: Payer: Self-pay | Admitting: Hematology and Oncology

## 2017-06-15 ENCOUNTER — Telehealth: Payer: Self-pay | Admitting: Pharmacist

## 2017-06-15 DIAGNOSIS — D6481 Anemia due to antineoplastic chemotherapy: Secondary | ICD-10-CM

## 2017-06-15 DIAGNOSIS — C9210 Chronic myeloid leukemia, BCR/ABL-positive, not having achieved remission: Secondary | ICD-10-CM

## 2017-06-15 DIAGNOSIS — K521 Toxic gastroenteritis and colitis: Secondary | ICD-10-CM

## 2017-06-15 DIAGNOSIS — T451X5A Adverse effect of antineoplastic and immunosuppressive drugs, initial encounter: Secondary | ICD-10-CM

## 2017-06-15 MED ORDER — BOSUTINIB 500 MG PO TABS: 500.0000 mg | ORAL_TABLET | Freq: Every day | ORAL | 3 refills | Status: DC

## 2017-06-15 NOTE — Assessment & Plan Note (Signed)
The patient tolerated treatment well except for diarrhea. He has achieved major molecular response Unfortunately, due to insurance issue, he ran out of medications 2 weeks ago I will get my pharmacist to help obtain financial assistance to pay for his medication I do not recommend him to go off treatment Based on current guidelines, he needs to be in MMR for at least 2 years before consideration to stop treatment I plan to see him back in 3 months

## 2017-06-15 NOTE — Assessment & Plan Note (Addendum)
He is able to get his diarrhea under control with treatment. I recommend continues to take Imodium or Lomotil as needed to control his diarrhea and so far he is doing well 

## 2017-06-15 NOTE — Progress Notes (Signed)
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 BCR/ABL by PCR detected abnormalitis with b2a2 & b3a2 subtypes      05/09/2012 - 03/16/2015 Chemotherapy    He was started on treatment with Dasatinib 100 mg daily      06/06/2012 Adverse Reaction    Dose of medication was reduced to 50 mg daily due to pancytopenia      01/24/2013 Progression    Patient was noted to have elevated blood count which and was subsequently found to be noncompliant to treatment. The patient has not been on treatment for several months because his prescription ran out. He was restarted back on treatment      01/16/2014 Progression    Bcr/ABL by PCR is worse. Dose of Dasatinib was increased to 100 mg.      02/26/2014 Tumor Marker    Blood work for ABL kinase mutation was negative.      10/29/2014 Tumor Marker    BCR/ABL b2a2 & b3a2 0.29%, IS 0.1624%, not in MMR yet but improving      11/05/2014 Adverse Reaction    He had thoracentesis due to pleural effusion      11/27/2014 Surgery    He underwent right video-assisted thoracoscopy, Drainage of pleural effusion, Pleural biopsy, Diaphragm biopsy, Lung biopsy & [place] pleurodesis      01/30/2015 Tumor Marker    BCR/ABL b2a2 & b3a2 0.78%, IS 0.4368%, not in MMR       03/11/2015 Pathology Results    BCR/ABL b2a2 0.19%, IS 0.1064%, not in MMR       03/16/2015 Adverse Reaction    Dasatinib was stopped due to  recurrent pleural effusion      03/28/2015 -  Chemotherapy    He started on Bosutinib      03/30/2015 Procedure    He has therapeutic thoracentesis of the right lung with 1 liter of fluid removed      03/30/2015 Adverse Reaction    Bosutinib is placed on hold, to be restart on 8/25 at 250 mg due to severe diarrhea      04/28/2015 Tumor Marker    BCR/ABL b2a2 0.06%, IS 0.0336%, in MMR       06/10/2015 Tumor Marker    BCR/ABL b2a2 0.14%, IS 0.1162%, not in MMR       08/14/2015 Pathology Results    BCR/ABL b2a2 0.02%, IS 0.0166%, In MMR       11/09/2015 Tumor Marker    BCR/ABL b2a2 0.005%, IS 0.0042%, In MMR       02/08/2016 Tumor Marker    BCR/ABL undetectable. In MMR      06/13/2016 Tumor Marker    BCR/ABL undetectable. In MMR      12/06/2016 Pathology Results    BCR/ABL undetectable. In MMR      06/08/2017 Pathology Results    BCR/ABL undetectable. In MMR       INTERVAL HISTORY: Please see below for problem oriented charting. He returns with his wife for further  follow-up He ran out of prescription recently. He denies recent chest pain, shortness of breath or recent congestive heart failure Diarrhea is under control with Lomotil and Imodium He denies recent infection He is up-to-date with vaccination.  REVIEW OF SYSTEMS:   Constitutional: Denies fevers, chills or abnormal weight loss Eyes: Denies blurriness of vision Ears, nose, mouth, throat, and face: Denies mucositis or sore throat Respiratory: Denies cough, dyspnea or wheezes Cardiovascular: Denies palpitation, chest discomfort or lower extremity swelling Skin: Denies abnormal skin rashes Lymphatics: Denies new lymphadenopathy or easy bruising Neurological:Denies numbness, tingling or new weaknesses Behavioral/Psych: Mood is stable, no new changes  All other systems were reviewed with the patient and are negative.  I have reviewed the past medical history, past surgical history, social history and family  history with the patient and they are unchanged from previous note.  ALLERGIES:  is allergic to norvasc [amlodipine besylate] and sulfa antibiotics.  MEDICATIONS:  Current Outpatient Medications  Medication Sig Dispense Refill  . aspirin 81 MG chewable tablet Chew 1 tablet (81 mg total) by mouth daily.    Marland Kitchen atorvastatin (LIPITOR) 80 MG tablet TAKE ONE TABLET BY MOUTH ONCE DAILY AT  6PM. 90 tablet 4  . bosutinib (BOSULIF) 500 MG tablet Take 1 tablet (500 mg total) daily with breakfast by mouth. Take with food. 90 tablet 3  . carvedilol (COREG) 6.25 MG tablet Take 1 tablet (6.25 mg total) by mouth 2 (two) times daily with a meal. 180 tablet 3  . clopidogrel (PLAVIX) 75 MG tablet Take 1 tablet (75 mg total) by mouth daily. 90 tablet 3  . diphenoxylate-atropine (LOMOTIL) 2.5-0.025 MG tablet Take 2 tablets by mouth 4 (four) times daily as needed for diarrhea or loose stools. 30 tablet 3  . furosemide (LASIX) 20 MG tablet Take 20 mg by mouth daily as needed for fluid or edema.    Marland Kitchen guaiFENesin (MUCINEX) 600 MG 12 hr tablet Take 1200 mg by mouth 2-3 times a day as needed for cough or to loosen phlegm    . mexiletine (MEXITIL) 200 MG capsule Take 1 capsule (200 mg total) by mouth 2 (two) times daily. 60 capsule 11  . nitroGLYCERIN (NITROSTAT) 0.4 MG SL tablet DISSOLVE 1 TABLET UNDER THE TONGUE EVERY 5 MINUTES AS NEEDED FOR CHEST PAIN 25 tablet 4  . ondansetron (ZOFRAN) 4 MG tablet Take 1 tablet (4 mg total) by mouth every 8 (eight) hours as needed for nausea or vomiting. 20 tablet 0  . sacubitril-valsartan (ENTRESTO) 49-51 MG Take 1 tablet by mouth 2 (two) times daily. 60 tablet 5  . sildenafil (VIAGRA) 100 MG tablet Take 0.5-1 tablets (50-100 mg total) by mouth daily as needed for erectile dysfunction. 5 tablet 11   No current facility-administered medications for this visit.     PHYSICAL EXAMINATION: ECOG PERFORMANCE STATUS: 1 - Symptomatic but completely ambulatory  Vitals:   06/15/17 0930   BP: (!) 153/79  Pulse: 70  Resp: 20  Temp: 98.7 F (37.1 C)  SpO2: 99%   Filed Weights   06/15/17 0930  Weight: 181 lb 12.8 oz (82.5 kg)    GENERAL:alert, no distress and comfortable SKIN: skin color, texture, turgor are normal, no rashes or significant lesions EYES: normal, Conjunctiva are pink and non-injected, sclera clear OROPHARYNX:no exudate, no erythema and lips, buccal mucosa, and tongue normal  NECK: supple, thyroid normal size, non-tender, without nodularity LYMPH:  no palpable lymphadenopathy in the cervical, axillary or inguinal LUNGS: clear to auscultation and percussion with  normal breathing effort HEART: regular rate & rhythm and no murmurs and no lower extremity edema ABDOMEN:abdomen soft, non-tender and normal bowel sounds Musculoskeletal:no cyanosis of digits and no clubbing  NEURO: alert & oriented x 3 with fluent speech, no focal motor/sensory deficits  LABORATORY DATA:  I have reviewed the data as listed    Component Value Date/Time   NA 142 06/08/2017 0832   K 3.9 06/08/2017 0832   CL 109 05/09/2017 1024   CL 110 (H) 11/14/2012 0918   CO2 25 06/08/2017 0832   GLUCOSE 98 06/08/2017 0832   GLUCOSE 108 (H) 11/14/2012 0918   BUN 11.6 06/08/2017 0832   CREATININE 0.9 06/08/2017 0832   CALCIUM 8.6 06/08/2017 0832   PROT 6.7 06/08/2017 0832   ALBUMIN 3.4 (L) 06/08/2017 0832   AST 13 06/08/2017 0832   ALT 7 06/08/2017 0832   ALKPHOS 96 06/08/2017 0832   BILITOT 0.44 06/08/2017 0832   GFRNONAA 50 (L) 05/09/2017 1024   GFRAA 58 (L) 05/09/2017 1024    No results found for: SPEP, UPEP  Lab Results  Component Value Date   WBC 5.0 06/08/2017   NEUTROABS 3.4 06/08/2017   HGB 11.5 (L) 06/08/2017   HCT 35.9 (L) 06/08/2017   MCV 93.2 06/08/2017   PLT 192 06/08/2017      Chemistry      Component Value Date/Time   NA 142 06/08/2017 0832   K 3.9 06/08/2017 0832   CL 109 05/09/2017 1024   CL 110 (H) 11/14/2012 0918   CO2 25 06/08/2017 0832   BUN  11.6 06/08/2017 0832   CREATININE 0.9 06/08/2017 0832      Component Value Date/Time   CALCIUM 8.6 06/08/2017 0832   ALKPHOS 96 06/08/2017 0832   AST 13 06/08/2017 0832   ALT 7 06/08/2017 0832   BILITOT 0.44 06/08/2017 0832      ASSESSMENT & PLAN:  Chronic myelogenous leukemia The patient tolerated treatment well except for diarrhea. He has achieved major molecular response Unfortunately, due to insurance issue, he ran out of medications 2 weeks ago I will get my pharmacist to help obtain financial assistance to pay for his medication I do not recommend him to go off treatment Based on current guidelines, he needs to be in MMR for at least 2 years before consideration to stop treatment I plan to see him back in 3 months   Anemia due to antineoplastic chemotherapy This is likely due to recent treatment. The patient denies recent history of bleeding such as epistaxis, hematuria or hematochezia. He is asymptomatic from the anemia. I will observe for now  Diarrhea due to drug He is able to get his diarrhea under control with treatment. I recommend continues to take Imodium or Lomotil as needed to control his diarrhea and so far he is doing well   No orders of the defined types were placed in this encounter.  All questions were answered. The patient knows to call the clinic with any problems, questions or concerns. No barriers to learning was detected. I spent 15 minutes counseling the patient face to face. The total time spent in the appointment was 20 minutes and more than 50% was on counseling and review of test results     [name], MD 06/15/2017 1:46 PM

## 2017-06-15 NOTE — Telephone Encounter (Signed)
Oral Oncology Patient Advocate Encounter  Received notification from [place] that prior authorization for Bosulif is required.  PA submitted on CoverMyMeds Key [id number] Status is pending  Oral Oncology Clinic will continue to follow.  Fabio Asa. Melynda Keller, [place] Patient [place] [phone] 06/15/2017 4:07 PM

## 2017-06-15 NOTE — Telephone Encounter (Addendum)
Oral Oncology Pharmacist Encounter  Received notification from MD that patient is having access issues for Bosulif (bosutinib) for the treatment of relapsed/refractory CML, planned duration until disease progression or unacceptable toxicity. Bosulif start date: 03/28/15  Labs from 06/08/17 assessed, OK for treatment. 04/12/17 EKG shows QTc 452 msec  Current medication list in Epic reviewed, no DDIs with Bosulif identified.  Oral Oncology Patient Advocate has LVM for patient to discuss acquisition issues and possible solutions.  Prescription has been e-scribed to the [facility] for benefits investigation and approval.  Oral Oncology Clinic will continue to follow.  Johny Drilling, PharmD, BCPS, BCOP 06/15/2017 12:51 PM Oral Oncology Clinic [phone]

## 2017-06-15 NOTE — Telephone Encounter (Signed)
Gave avs and calendar for February 2019 °

## 2017-06-15 NOTE — Assessment & Plan Note (Signed)
This is likely due to recent treatment. The patient denies recent history of bleeding such as epistaxis, hematuria or hematochezia. He is asymptomatic from the anemia. I will observe for now.    

## 2017-06-16 NOTE — Telephone Encounter (Signed)
Oral Oncology Patient Advocate Encounter  Spoke with the patient about his acquisition issues for Bosulif.    Difficulties arose when his copay assistance grant ran out of funds. His monthly copayment is currently $706.93.   I have been able to obtain a voucher for a free 30 day supply.  I will need a printed and signed prescription for 30 tablets in order to submit this voucher to [place] for dispensing.  It will be shipped to the patient's home after approval and processing.     We have made arrangements for him to come to the office next week to sign an application for [employer].  We will submit an application with hopes that we will be able to obtain Bosulif at $0 out of pocket costs for the patient.    I will also monitor for copayment assistance grants while the application is pending.    Fabio Asa. Melynda Keller, [place] Patient [place] [phone] 06/16/2017 12:03 PM

## 2017-06-16 NOTE — Telephone Encounter (Signed)
Oral Oncology Patient Advocate Encounter  Prior Authorization for Bosulif has been approved.    PA# [id number] Effective dates: 06/16/2017 through 06/16/2018  Oral Oncology Clinic will continue to follow.   Anthony Wolfe. Melynda Keller, [place] Patient [place] [phone] 06/16/2017 10:23 AM

## 2017-06-19 ENCOUNTER — Other Ambulatory Visit: Payer: Self-pay | Admitting: *Deleted

## 2017-06-19 ENCOUNTER — Telehealth: Payer: Self-pay | Admitting: Cardiology

## 2017-06-19 ENCOUNTER — Ambulatory Visit (INDEPENDENT_AMBULATORY_CARE_PROVIDER_SITE_OTHER): Payer: Medicare HMO | Admitting: *Deleted

## 2017-06-19 ENCOUNTER — Telehealth: Payer: Self-pay | Admitting: Pharmacy Technician

## 2017-06-19 DIAGNOSIS — I472 Ventricular tachycardia, unspecified: Secondary | ICD-10-CM

## 2017-06-19 DIAGNOSIS — C9210 Chronic myeloid leukemia, BCR/ABL-positive, not having achieved remission: Secondary | ICD-10-CM

## 2017-06-19 MED ORDER — BOSUTINIB 500 MG PO TABS: 500.0000 mg | ORAL_TABLET | Freq: Every day | ORAL | 0 refills | Status: DC

## 2017-06-19 NOTE — Telephone Encounter (Signed)
Oral Oncology Patient Advocate Encounter  30 day prescription and voucher for [facility] faxed to [phone].  This will provide the patient with 30 days of free medication.   He is coming to the clinic this week to sign an application for [employer].    I will continue to provide updates on acquiring Bosulif for this patient.    Fabio Asa. Melynda Keller, [place] Patient [place] [phone] 06/19/2017 12:17 PM

## 2017-06-19 NOTE — Telephone Encounter (Signed)
Spoke with pt and reminded pt of remote transmission that is due today. Pt verbalized understanding.   

## 2017-06-20 LAB — CUP PACEART REMOTE DEVICE CHECK
Date Time Interrogation Session: 20181112181106
HIGH POWER IMPEDANCE MEASURED VALUE: 55 Ohm
Implantable Lead Implant Date: 20151014
Implantable Pulse Generator Implant Date: 20151014
Lead Channel Pacing Threshold Amplitude: 0.875 V
Lead Channel Pacing Threshold Pulse Width: 0.4 ms
Lead Channel Sensing Intrinsic Amplitude: 8 mV
Lead Channel Sensing Intrinsic Amplitude: 8 mV
MDC IDC LEAD LOCATION: 753860
MDC IDC MSMT BATTERY REMAINING LONGEVITY: 109 mo
MDC IDC MSMT BATTERY VOLTAGE: 3.01 V
MDC IDC MSMT LEADCHNL RV IMPEDANCE VALUE: 361 Ohm
MDC IDC MSMT LEADCHNL RV IMPEDANCE VALUE: 418 Ohm
MDC IDC SET LEADCHNL RV PACING AMPLITUDE: 2.25 V
MDC IDC SET LEADCHNL RV PACING PULSEWIDTH: 0.4 ms
MDC IDC SET LEADCHNL RV SENSING SENSITIVITY: 0.3 mV
MDC IDC STAT BRADY RV PERCENT PACED: 0.04 %

## 2017-06-20 NOTE — Progress Notes (Signed)
Remote ICD transmission.   

## 2017-06-23 ENCOUNTER — Encounter: Payer: Self-pay | Admitting: Cardiology

## 2017-06-26 NOTE — Telephone Encounter (Signed)
Oral Oncology Patient Advocate Encounter  Met patient in [place] to complete application for [employer] in an effort to reduce patient's out of pocket expense for Bosulif to $0.    Application completed and faxed to [phone].   [employer] patient assistance phone number for follow up is [phone].   This encounter will be updated until final determination.   Fabio Asa. Melynda Keller, [place] Patient [place] [phone] 06/26/2017 3:24 PM

## 2017-07-02 NOTE — Progress Notes (Signed)
Cardiology Office Note Date:  07/03/2017  Patient ID:  Anthony Wolfe, Read June 27, 1946, MRN [id number] PCP:  [name], MD  Electrophysiologist:  Dr. Caryl Comes Pulmonary: Dr. Halford Chessman     Chief Complaint: planned f/u  History of Present Illness: Anthony Wolfe is a 71 y.o. male with history of VT (+EP inducible VT) w/ICD, CAD (CABG), PVD (R CEA, known occluded L ICA), chronic CHF (systolic), ICM, CML (follows with oncology), emphysema,  HTN, HLD, CVA.  He comes today to be seen for Dr. Caryl Comes.  Last seen by him on 04/12/17, at that visit found to have had VT mislabeled as SVT by his device, started mexiletine (mentioning intolerant of amio) and planned for stress testing to evaluate for ischemia.  Saw PMD 05/09/17 with much improvement of his DOE, on the mexiletine and entresto.  Stress test was neg for ischemia, EF 35%,  CT angio/scan with chronic pulm findings    He was seen by myself in October, at that [place] since the couple changes in his medicines he was feeling 100% better.  He denied any kind of SOB, no rest SOB or DOE.  He had a VT event 04/15/16, he recalled the day well reports he had been doing some particularly strenuous work and felt well that day without dizziness, near syncope or syncope.  He denied any kind of CP, no palpitations.  He was very happy with how he wass feeling since the medicine adjustments.  His PMD hadstarted Entresto, and discussed plans for titration and follow up, it is very expensive but they are working with the pharmacy and company for assistance availability, and has been getting samples.  He comes today again accompanied by his wife.  He feels well (she agrees) he remains very active, often doing more then she thinks e should.  He had VT 06/23/17 he does not recall anything particular about the day or any symptoms, though says he may have been digging a trench for a water line.  His wife feels like these activities are to much and he should tone it down.  He  denies any kind of CP, palpitations or SOB, denies any exertional intolerances, no dizzy spell,s no near syncope or syncope.   Device information: MDT single chamber ICD, implanted 05/21/14, Dr. Caryl Comes + Hx of VT AAD: mexiletine started Sept 2018   Past Medical History:  Diagnosis Date  . AICD (automatic cardioverter/defibrillator) present   . Allergic rhinitis   . 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 Anxiety   . Blood dyscrasia    cmleukemia  . C. difficile colitis 06/16/2014  . CAD (coronary artery disease)    a. s/p 3v CABG 2010. b. NSTEMI 05/2014 in setting of VT. Cath impressions: "Recent IMI with occluded SVG to RCA. Has Right to Right collaterals and collaterals from septal and OM. EF 40%."  . Carotid disease, bilateral ([place])    a. Right CEA 2002; known occluded left carotid. b. Duplex 06/2014: known occluded LICA, stable 6-64% RICA s/p CEA.  . Chronic systolic CHF (congestive heart failure) ([place])   . CML (chronic myelocytic leukemia) ([place]) 05/09/2012  . Depression   . Emphysema of lung ([employer])   . HTN (hypertension)   . Hyperlipidemia   . Ischemic cardiomyopathy    a. Prior EF 36%. b. 2014: 50%. c. 05/2014: EF 35-40% by echo, 40% with inf HK by cath.  . NSTEMI (non-ST elevation myocardial infarction) ([place]) 05/2014  . Pleural effusion 05/2014  . Pneumonia  05/23/2014  . Presence of permanent cardiac pacemaker   . Prostate hypertrophy   . PVC's (premature ventricular contractions)    a. PVC's with prior Holter showing PVC load of 22%.  . Shortness of breath dyspnea   . Stroke ([place]) 01  . TIA (transient ischemic attack)    ASPVD, S./P. right CEA, 2002, and known occluded left carotid  . Ventricular tachycardia ([place])    a. Admitted with VT 05/2013 - EPS with inducible sustained monomorphic VT; s/p Medtronic ICD 05/21/14.    Past Surgical History:  Procedure Laterality Date  . BACK SURGERY    . CAROTID ENDARTERECTOMY Right 01  . carotidectomy  2003   right side  .  COLONOSCOPY  07/2010  . CORONARY ARTERY BYPASS GRAFT  02/2009   3 vessel  . ELECTROPHYSIOLOGIC STUDY  05/21/14  . ELECTROPHYSIOLOGY STUDY N/A 05/21/2014   Procedure: ELECTROPHYSIOLOGY STUDY;  Surgeon: Deboraha Sprang, MD;  Location: [facility] CATH LAB;  Service: Cardiovascular;  Laterality: N/A;  . EP IMPLANTABLE DEVICE  05/21/14   single chamber Metronic ICD  . LEFT HEART CATHETERIZATION WITH CORONARY/GRAFT ANGIOGRAM N/A 05/19/2014   Procedure: LEFT HEART CATHETERIZATION WITH Beatrix Fetters;  Surgeon: Josue Hector, MD;  Location: [facility] CATH LAB;  Service: Cardiovascular;  Laterality: N/A;  . PLEURAL BIOPSY N/A 11/27/2014   Procedure: PLEURAL BIOPSY;  Surgeon: [name], MD;  Location: [place];  Service: Thoracic;  Laterality: N/A;  . PLEURAL EFFUSION DRAINAGE Right 11/27/2014   Procedure: DRAINAGE OF PLEURAL EFFUSION;  Surgeon: [name], MD;  Location: [place];  Service: Thoracic;  Laterality: Right;  . TALC PLEURODESIS Right 11/27/2014   Procedure: Pietro Cassis;  Surgeon: [name], MD;  Location: [place];  Service: Thoracic;  Laterality: Right;  Marland Kitchen VIDEO ASSISTED THORACOSCOPY Right 11/27/2014   Procedure: RIGHT VIDEO ASSISTED THORACOSCOPY;  Surgeon: [name], MD;  Location: [place];  Service: Thoracic;  Laterality: Right;    Current Outpatient Medications  Medication Sig Dispense Refill  . aspirin 81 MG chewable tablet Chew 1 tablet (81 mg total) by mouth daily.    Marland Kitchen atorvastatin (LIPITOR) 80 MG tablet TAKE ONE TABLET BY MOUTH ONCE DAILY AT  6PM. 90 tablet 4  . bosutinib (BOSULIF) 500 MG tablet Take 1 tablet (500 mg total) daily with breakfast by mouth. Take with food. 30 tablet 0  . carvedilol (COREG) 6.25 MG tablet Take 1 tablet (6.25 mg total) by mouth 2 (two) times daily with a meal. 180 tablet 3  . clopidogrel (PLAVIX) 75 MG tablet Take 1 tablet (75 mg total) by mouth daily. 90 tablet 3  . diphenoxylate-atropine (LOMOTIL) 2.5-0.025 MG tablet  Take 2 tablets by mouth 4 (four) times daily as needed for diarrhea or loose stools. 30 tablet 3  . furosemide (LASIX) 20 MG tablet Take 20 mg by mouth daily as needed for fluid or edema.    Marland Kitchen guaiFENesin (MUCINEX) 600 MG 12 hr tablet Take 1200 mg by mouth 2-3 times a day as needed for cough or to loosen phlegm    . mexiletine (MEXITIL) 200 MG capsule Take 1 capsule (200 mg total) by mouth 2 (two) times daily. 60 capsule 11  . nitroGLYCERIN (NITROSTAT) 0.4 MG SL tablet DISSOLVE 1 TABLET UNDER THE TONGUE EVERY 5 MINUTES AS NEEDED FOR CHEST PAIN 25 tablet 4  . ondansetron (ZOFRAN) 4 MG tablet Take 1 tablet (4 mg total) by mouth every 8 (eight) hours as needed for nausea or vomiting. [place]  tablet 0  . sacubitril-valsartan (ENTRESTO) 49-51 MG Take 1 tablet by mouth 2 (two) times daily. 60 tablet 5  . sildenafil (VIAGRA) 100 MG tablet Take 0.5-1 tablets (50-100 mg total) by mouth daily as needed for erectile dysfunction. 5 tablet 11   No current facility-administered medications for this visit.     Allergies:   Norvasc [amlodipine besylate] and Sulfa antibiotics   Social History:  The patient  reports that he quit smoking about 19 years ago. His smoking use included cigarettes. He has a 135.00 pack-year smoking history. He has quit using smokeless tobacco. He reports that he does not drink alcohol or use drugs.   Family History:  The patient's family history includes Alcohol abuse in his father; Heart disease in his mother.  ROS:  Please see the history of present illness.  All other systems are reviewed and otherwise negative.   PHYSICAL EXAM:  VS:  BP (!) 158/82   Pulse 70   Ht 5\' 9"  (1.753 m)   Wt 186 lb (84.4 kg)   SpO2 98%   BMI 27.47 kg/m  BMI: Body mass index is 27.47 kg/m. Well nourished, well developed, in no acute distress  HEENT: normocephalic, atraumatic  Neck: no JVD, carotid bruits or masses Cardiac:  RRR; no significant murmurs, no rubs, or gallops Lungs:  CTA b/l, no  wheezing, rhonchi or rales  Abd: soft, nontender MS: no deformity, age appropriate atrophy Ext:   no edema  Skin: warm and dry, no rash Neuro:  No gross deficits appreciated Psych: euthymic mood, full affect   ICD site is stable, no tethering or discomfort   EKG:   Not done today ICD interrogation done today and reviewed by myself: battery and lead measurements are good.  Nov 16th he had a number of NSVT episodes as well as a treated VT/FV episode, successfully by ATP, required 2 burst treatments, on nov 17th he had 2 episodes labeled as SVT by wavelet, morphology appears the same as his VT, these were short 24, 44 seconds OptiVol looks OK, below threshold  Wavelet was updated today, no other programming changes were made  04/13/17: lexiscan stress test  Nuclear stress EF: 35%.  Inferior/inferoseptal defect consistent iwht scar No significant ischemia  Intermediate risk study  06/07/16: TTE Study Conclusions - Left ventricle: Global longitudinal LV strain is abnormal at -12%   The cavity size was normal. There was moderate focal basal and   mild concentric hypertrophy. Systolic function was mildly   reduced. The estimated ejection fraction was in the range of 45%   to 50%. There is akinesis of the basal-midinferior myocardium. - Aortic valve: Mildly to moderately calcified annulus. Trileaflet;   mildly thickened, mildly calcified leaflets. - Aorta: Aortic root dimension: 38 mm (ED). - Aortic root: The aortic root was mildly dilated. - Mitral valve: There was trivial regurgitation. - Left atrium: The atrium was mildly to moderately dilated. - Right ventricle: The cavity size was mildly dilated. Wall   thickness was normal. - Tricuspid valve: There was trivial regurgitation.   11/14/16: Carotid US IMPRESSION: 1. Surgical changes of prior right internal carotid endarterectomy with mild residual heterogeneous atherosclerotic plaque. By peak systolic velocity criteria, the  estimated stenosis falls in the 1 - 49% range. 2. 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 3. The vertebral arteries are patent with normal antegrade flow bilaterally.    Recent Labs: 03/15/2017: TSH 2.510 04/12/2017: Magnesium 2.3  06/08/2017: ALT 7; BUN 11.6; Creatinine 0.9; HGB 11.5; Platelets 192; Potassium 3.9; Sodium 142  03/15/2017: Chol/HDL Ratio 3.3; Cholesterol, Total 102; HDL 31; LDL Calculated 54; Triglycerides 87   CrCl cannot be calculated (Patient's most recent lab result is older than the maximum 21 days allowed.).   Wt Readings from Last 3 Encounters:  07/03/17 186 lb (84.4 kg)  06/15/17 181 lb 12.8 oz (82.5 kg)  06/02/17 190 lb (86.2 kg)     Other studies reviewed: Additional studies/records reviewed today include: summarized above  ASSESSMENT AND PLAN:  1. ICD    2 SVT episodes appear like his VT, wavelet was updated, will follow, consider turning off   2. VT     On mexiletine     He had one treated episode, a number of NSVT since his last visit     Will uptitrate his coreg 1st to 12.5mg  BID     No ischemic on his stress test + scar, likely his VT origin     Asymptomatic with his VT, no ischemic symptoms      He is reminded of [place] law, no driving 6 month sgiven treated VT episode (though was asymptomatic)  The patient's insurance company mailed him a letter, the mexiletine will no longer be covered starting 2019.  They suggested amiodarone which he has been on already and intolerant of by prior notes, flecainide and propafenone were suggested though inappropriate for this patient given his CAD, they also suggested gabapentin and duloxetine which are not antiarrhythmic drugs and will not treat his VT.  I have reached out to our prior auth/insurance team to aid in this issue.    Will continue to up-titrate his BB, may need to increase his  mexitil as well.  3. ICM,  chronic CHF (systolic)     His SOB remains resolved.  Fluid status by exam and OptiVol look OK     DOE was likely multifactorial, though the addition of Entrsto seems to have made a significant improvement     He will continue plans as planned with his PMD for titration, BP seems to be tolerating     On BB/ARB, diuretic tx  4. CAD     No ischemia on stress testing     No anginal complaints     Remains on ASA/Plavix with CAD/PVD, BB, statin tx     August lipids look ok, LFTs Nov ok  5. HTN    increae his Coreg    Disposition: I will see him back in 1 mo, for continued up-titration of his BB, and perhaps his mexitil, sooner if needed.   Current medicines are reviewed at length with the patient today.  The patient did not have any concerns regarding medicines.  Anthony Night, PA-C 07/03/2017 8:47 AM     [address] [place] [address] [phone] (office)  [phone] (fax)

## 2017-07-03 ENCOUNTER — Telehealth: Payer: Self-pay

## 2017-07-03 ENCOUNTER — Ambulatory Visit: Payer: Medicare HMO | Admitting: Physician Assistant

## 2017-07-03 VITALS — BP 158/82 | HR 70 | Ht 69.0 in | Wt 186.0 lb

## 2017-07-03 DIAGNOSIS — I2510 Atherosclerotic heart disease of native coronary artery without angina pectoris: Secondary | ICD-10-CM

## 2017-07-03 DIAGNOSIS — I10 Essential (primary) hypertension: Secondary | ICD-10-CM | POA: Diagnosis not present

## 2017-07-03 DIAGNOSIS — I255 Ischemic cardiomyopathy: Secondary | ICD-10-CM

## 2017-07-03 DIAGNOSIS — I472 Ventricular tachycardia, unspecified: Secondary | ICD-10-CM

## 2017-07-03 DIAGNOSIS — I5022 Chronic systolic (congestive) heart failure: Secondary | ICD-10-CM | POA: Diagnosis not present

## 2017-07-03 DIAGNOSIS — Z95810 Presence of automatic (implantable) cardiac defibrillator: Secondary | ICD-10-CM

## 2017-07-03 MED ORDER — CARVEDILOL 12.5 MG PO TABS: 12.5000 mg | ORAL_TABLET | Freq: Two times a day (BID) | ORAL | 3 refills | Status: DC

## 2017-07-03 NOTE — Telephone Encounter (Signed)
**Note De-Identified Anthony Wolfe Obfuscation** The pt brought in a letter he received from [facility] stating that Mexiletine will not be on their formulary in 2019 unless we provide documentation that he cannot take alternatives that is on the formulary.  I called [employer] and s/w Gavane. Gevane and I did an authorization over the phone. Per Bo Mcclintock we should receive a determination from [facility] within 24 to 74 hours. Reference # [id number]

## 2017-07-03 NOTE — Patient Instructions (Signed)
Medication Instructions:   START TAKING COREG 12.5 MG TWICE A DAY   If you need a refill on your cardiac medications before your next appointment, please call your pharmacy.  Labwork: NONE ORDERED  TODAY    Testing/Procedures: NONE ORDERED  TODAY    Follow-Up: WITH KLEIN OR URSUY IN ONE MONTH    Any Other Special Instructions Will Be Listed Below (If Applicable).

## 2017-07-06 ENCOUNTER — Telehealth: Payer: Self-pay | Admitting: Family Medicine

## 2017-07-06 NOTE — Telephone Encounter (Signed)
Pt's wife called [facility] up front stating that pt was having pain shooting through his chest and wanted to make an apt with Dr. Dennard Schaumann. Tried to call pt back had to [facility] on vm.

## 2017-07-07 ENCOUNTER — Encounter: Payer: Medicare HMO | Admitting: Physician Assistant

## 2017-07-10 ENCOUNTER — Ambulatory Visit: Payer: Medicare HMO | Admitting: Family Medicine

## 2017-07-10 ENCOUNTER — Encounter: Payer: Self-pay | Admitting: Family Medicine

## 2017-07-10 VITALS — BP 126/68 | HR 76 | Temp 98.2°F | Resp 16 | Ht 69.0 in | Wt 183.0 lb

## 2017-07-10 DIAGNOSIS — S46811A Strain of other muscles, fascia and tendons at shoulder and upper arm level, right arm, initial encounter: Secondary | ICD-10-CM | POA: Diagnosis not present

## 2017-07-10 MED ORDER — PREDNISONE 20 MG PO TABS: ORAL_TABLET | ORAL | 0 refills | Status: DC

## 2017-07-10 MED ORDER — DIAZEPAM 10 MG PO TABS: 10.0000 mg | ORAL_TABLET | Freq: Three times a day (TID) | ORAL | 1 refills | Status: DC | PRN

## 2017-07-10 NOTE — Telephone Encounter (Signed)
Seen in office today - was for neck pain not chest pain.
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
## 2017-07-13 NOTE — Telephone Encounter (Signed)
Oral Oncology Patient Advocate Encounter  Received notification from [place] Patient Assistance program that patient has been successfully enrolled into their program to receive Bosulif from the manufacturer at $0 out of pocket from 07/06/2017 until 08/07/2017.  He will automatically be evaluated for enrollment in the coming year.    The patient has already received his first shipment.    Patient knows to call the office with any additional questions or concerns.  Oral Oncology Clinic will continue to follow.  Gilmore Laroche, CPhT, [place] Oral Oncology Patient Advocate [phone] 07/13/2017 2:53 PM

## 2017-07-25 ENCOUNTER — Other Ambulatory Visit: Payer: Self-pay | Admitting: Family Medicine

## 2017-08-03 ENCOUNTER — Ambulatory Visit: Payer: Medicare HMO | Admitting: Internal Medicine

## 2017-08-03 VITALS — BP 108/61 | HR 61 | Ht 69.0 in | Wt 187.0 lb

## 2017-08-03 DIAGNOSIS — Z95810 Presence of automatic (implantable) cardiac defibrillator: Secondary | ICD-10-CM | POA: Diagnosis not present

## 2017-08-03 DIAGNOSIS — I255 Ischemic cardiomyopathy: Secondary | ICD-10-CM | POA: Diagnosis not present

## 2017-08-03 DIAGNOSIS — I5022 Chronic systolic (congestive) heart failure: Secondary | ICD-10-CM

## 2017-08-03 DIAGNOSIS — I472 Ventricular tachycardia, unspecified: Secondary | ICD-10-CM

## 2017-08-03 NOTE — Progress Notes (Signed)
.      Patient Care Team: [name], MD as PCP - General (Family Medicine) [name]., MD as Attending Physician (Cardiology) [name], MD (Family Medicine) [name], MD (Cardiothoracic Surgery) Early, Arvilla Meres, MD as Attending Physician (Vascular Surgery) [name], MD as Consulting Physician (Hematology and Oncology) Deboraha Sprang, MD as Consulting Physician (Cardiology)   HPI  Anthony Wolfe is a 71 y.o. male Seen in follow-up for an ICD implanted for secondary prevention after he presented with ventricular tachycardia 10/15. Catheterization demonstrated occluded vein graft to his RCA ejection fraction was about 40%  He is status post bypass surgery 2010  He underwent EP testing and was found to be inducible.  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   He underwent a thoracentesis initially in 11/15 and redo 12/15.  He saw pulm 1/16 with question as to whether immunotherpay could be explanation of effusion;  He had pleurodesis.   DATE TEST    9/14    Myoview   EF ?? No ischemia  3/171 Carotids R ICA 40-59%//L ICA- T   10/17    Echo   EF 45-50 % LAE 46/2.26/35        Seen by PA and WC A few weeks ago. Multiple episodes of nonsustained ventricular tachycardia and sVT were noted on the device.  One lasted > 6 hrs and amiodarone was initiated. He was unable to tolerate he had ventricular tachycardia again in September 2018 and mexiletine was initiated.  He has had one asymptomatic episode of treated ventricular tachycardia November 2018.  At this point he is doing better.  He is without arrhythmia.  He has denies chest pain or shortness of breath.  He is having no peripheral edema.  No bleeding issues.  He is on aspirin and Plavix.  He has a history of remote TIA in addition to his coronary disease.  Date Cr K Mg Hgb  5/18  1.1 3.7     11/18 0.9 3.9 2.3  11.5     Past Medical History:  Diagnosis Date  . AICD (automatic cardioverter/defibrillator) present   . Allergic rhinitis   . 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 Anxiety   . Blood dyscrasia    cmleukemia  . C. difficile colitis 06/16/2014  . CAD (coronary artery disease)    a. s/p 3v CABG 2010. b. NSTEMI 05/2014 in setting of VT. Cath impressions: "Recent IMI with occluded SVG to RCA. Has Right to Right collaterals and collaterals from septal and OM. EF 40%."  . Carotid disease, bilateral ([place])    a. Right CEA 2002; known occluded left carotid. b. Duplex 06/2014: known occluded LICA, stable 1-61% RICA s/p CEA.  . Chronic systolic CHF (congestive heart failure) ([place])   . 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 Depression   . Emphysema of lung ([place])   . HTN (hypertension)   . Hyperlipidemia   . Ischemic cardiomyopathy    a. Prior EF 36%. b. 2014: 50%. c. 05/2014: EF 35-40% by echo, 40% with inf HK by cath.  . NSTEMI (non-ST elevation myocardial infarction) ([place]) 05/2014  . Pleural effusion 05/2014  . Pneumonia 05/23/2014  . Presence of permanent cardiac pacemaker   . Prostate hypertrophy   . PVC's (premature ventricular contractions)    a. PVC's with prior Holter showing PVC load of 22%.  . Shortness of breath dyspnea   .  Stroke ([place]) 01  . TIA (transient ischemic attack)    ASPVD, S./P. right CEA, 2002, and known occluded left carotid  . Ventricular tachycardia ([place])    a. Admitted with VT 05/2013 - EPS with inducible sustained monomorphic VT; s/p Medtronic ICD 05/21/14.    Past Surgical History:  Procedure Laterality Date  . BACK SURGERY    . CAROTID ENDARTERECTOMY Right 01  . carotidectomy  2003   right side  . COLONOSCOPY  07/2010  . CORONARY ARTERY BYPASS GRAFT  02/2009   3 vessel  . ELECTROPHYSIOLOGIC STUDY  05/21/14  . ELECTROPHYSIOLOGY STUDY N/A 05/21/2014   Procedure: ELECTROPHYSIOLOGY STUDY;  Surgeon: Deboraha Sprang, MD;  Location: [facility] CATH  LAB;  Service: Cardiovascular;  Laterality: N/A;  . EP IMPLANTABLE DEVICE  05/21/14   single chamber Metronic ICD  . LEFT HEART CATHETERIZATION WITH CORONARY/GRAFT ANGIOGRAM N/A 05/19/2014   Procedure: LEFT HEART CATHETERIZATION WITH Beatrix Fetters;  Surgeon: Josue Hector, MD;  Location: [facility] CATH LAB;  Service: Cardiovascular;  Laterality: N/A;  . PLEURAL BIOPSY N/A 11/27/2014   Procedure: PLEURAL BIOPSY;  Surgeon: [name], MD;  Location: [place];  Service: Thoracic;  Laterality: N/A;  . PLEURAL EFFUSION DRAINAGE Right 11/27/2014   Procedure: DRAINAGE OF PLEURAL EFFUSION;  Surgeon: [name], MD;  Location: [place];  Service: Thoracic;  Laterality: Right;  . TALC PLEURODESIS Right 11/27/2014   Procedure: Pietro Cassis;  Surgeon: [name], MD;  Location: [place];  Service: Thoracic;  Laterality: Right;  Marland Kitchen VIDEO ASSISTED THORACOSCOPY Right 11/27/2014   Procedure: RIGHT VIDEO ASSISTED THORACOSCOPY;  Surgeon: [name], MD;  Location: [place];  Service: Thoracic;  Laterality: Right;    Current Outpatient Medications  Medication Sig Dispense Refill  . aspirin 81 MG chewable tablet Chew 1 tablet (81 mg total) by mouth daily.    Marland Kitchen atorvastatin (LIPITOR) 80 MG tablet TAKE ONE TABLET BY MOUTH ONCE DAILY AT  6PM. 90 tablet 4  . bosutinib (BOSULIF) 500 MG tablet Take 1 tablet (500 mg total) daily with breakfast by mouth. Take with food. 30 tablet 0  . carvedilol (COREG) 12.5 MG tablet Take 1 tablet (12.5 mg total) by mouth 2 (two) times daily with a meal. 180 tablet 3  . clopidogrel (PLAVIX) 75 MG tablet TAKE 1 TABLET BY MOUTH DAILY 90 tablet 3  . diazepam (VALIUM) 10 MG tablet Take 1 tablet (10 mg total) by mouth every 8 (eight) hours as needed for anxiety (take 1/2 to 1 tab every eight hours as needed for muscle spasm). 30 tablet 1  . diphenoxylate-atropine (LOMOTIL) 2.5-0.025 MG tablet Take 2 tablets by mouth 4 (four) times daily as needed for diarrhea  or loose stools. 30 tablet 3  . furosemide (LASIX) 20 MG tablet Take 20 mg by mouth daily as needed for fluid or edema.    Marland Kitchen guaiFENesin (MUCINEX) 600 MG 12 hr tablet Take 1200 mg by mouth 2-3 times a day as needed for cough or to loosen phlegm    . mexiletine (MEXITIL) 200 MG capsule Take 1 capsule (200 mg total) by mouth 2 (two) times daily. 60 capsule 11  . nitroGLYCERIN (NITROSTAT) 0.4 MG SL tablet DISSOLVE 1 TABLET UNDER THE TONGUE EVERY 5 MINUTES AS NEEDED FOR CHEST PAIN 25 tablet 4  . ondansetron (ZOFRAN) 4 MG tablet Take 1 tablet (4 mg total) by mouth every 8 (eight) hours as needed for nausea or vomiting. 20 tablet 0  . predniSONE (DELTASONE)  20 MG tablet 3 tabs poqday 1-2, 2 tabs poqday 3-4, 1 tab poqday 5-6 12 tablet 0  . sacubitril-valsartan (ENTRESTO) 49-51 MG Take 1 tablet by mouth 2 (two) times daily. 60 tablet 5  . sildenafil (VIAGRA) 100 MG tablet Take 0.5-1 tablets (50-100 mg total) by mouth daily as needed for erectile dysfunction. 5 tablet 11   No current facility-administered medications for this visit.     Allergies  Allergen Reactions  . Norvasc [Amlodipine Besylate] Rash    rash  . Sulfa Antibiotics Hives    Hives     Review of Systems negative except from HPI and PMH  Physical Exam BP 108/61   Pulse 61   Ht 5\' 9"  (1.753 m)   Wt 187 lb (84.8 kg)   SpO2 96%   BMI 27.62 kg/m  Well developed and nourished in no acute distress HENT normal Neck supple with JVP-flat Clear Regular rate and rhythm, no murmurs or gallops Abd-soft with active BS No Clubbing cyanosis edema Skin-warm and dry A & Oriented  Grossly normal sensory and motor function    ECG   demonstrates sinus rhythm at 61 Intervals 21/11/42 Axis right 136 Inferior wall MI Septal MI  Assessment and  Plan  Ventricular tachycardia   Syncope - no recurrent   ischemic cardiomyopathy depressed LV function and prior bypass  Implantable defibrillator-Medtronic  Carotid Stenosis  Known L  occlusion    TIA  Without symptoms of ischemia  On Anticoagulation;  No bleeding issues   Is been years now on aspirin Plavix.  We will discontinue the aspirin and continue Plavix based on stroke secondary prevention as well as coronary disease  Lipids were at goal 8/18  No interval ventricular tachycardia.  We will continue him on mexiletine.  In the event that there are recurrences, I would add ranolazine.  Tolerating Entresto.  Blood pressure precludes further up titration.  Anemia is mild but stable.

## 2017-08-03 NOTE — Patient Instructions (Addendum)
Medication Instructions: Your physician has recommended you make the following change in your medication:  -1) STOP ASPIRIN  Labwork: None Ordered  Procedures/Testing: None Ordered  Follow-Up: Your physician recommends that you schedule a follow-up appointment in: 4 MONTHS with Tommye Standard, PA-C  Remote monitoring is used to monitor your ICD from home. This monitoring reduces the number of office visits required to check your device to one time per year. It allows [place] to keep an eye on the functioning of your device to ensure it is working properly. You are scheduled for a device check from home on 09/18/17. You may send your transmission at any time that day. If you have a wireless device, the transmission will be sent automatically. After your physician reviews your transmission, you will receive a postcard with your next transmission date.    If you need a refill on your cardiac medications before your next appointment, please call your pharmacy.

## 2017-08-25 ENCOUNTER — Telehealth: Payer: Self-pay | Admitting: Pharmacy Technician

## 2017-08-25 NOTE — Telephone Encounter (Signed)
Oral Oncology Patient Advocate Encounter  Received notification from [place] Patient Assistance program that patient has been successfully re-enrolled into their program to continue to receive Bosulif from the manufacturer at $0 out of pocket until 08/07/2018.   The representative is going to reach out to Anthony Wolfe this morning to notify him as well as make arrangements for shipment of medication.   Oral Oncology Clinic will continue to follow.  Gilmore Laroche, CPhT, [place] Oral Oncology Patient Advocate [phone] 08/25/2017 11:00 AM

## 2017-09-01 LAB — CUP PACEART INCLINIC DEVICE CHECK
Battery Remaining Longevity: 105 mo
Battery Voltage: 3 V
Brady Statistic RV Percent Paced: 0.03 %
Date Time Interrogation Session: 20181227163036
HighPow Impedance: 47 Ohm
Implantable Lead Implant Date: 20151014
Implantable Lead Location: 753860
Implantable Pulse Generator Implant Date: 20151014
Lead Channel Impedance Value: 342 Ohm
Lead Channel Impedance Value: 399 Ohm
Lead Channel Pacing Threshold Amplitude: 1 V
Lead Channel Pacing Threshold Pulse Width: 0.4 ms
Lead Channel Sensing Intrinsic Amplitude: 8.875 mV
Lead Channel Setting Pacing Amplitude: 2 V
Lead Channel Setting Pacing Pulse Width: 0.4 ms
Lead Channel Setting Sensing Sensitivity: 0.3 mV

## 2017-09-04 ENCOUNTER — Telehealth: Payer: Self-pay | Admitting: Internal Medicine

## 2017-09-04 ENCOUNTER — Telehealth: Payer: Self-pay | Admitting: Family Medicine

## 2017-09-04 NOTE — Telephone Encounter (Signed)
I called [employer] and s/w Tammy. Per Tammy the pts deductible is $160.00 and that he has only paid $14.60 of that. She states that even if the pt meets his deductible his cost for Mexiletine will still cost him $95 for a 30 day supply because it is a tier 4 drug.  Tammy recommended that I attempt a tier exception even though Mexiletine is non formulary and is a tier 4 drug. I agreed and she transferred my call to [facility].  Merlise and I did a tier exception for Mexiletine over the phone.  Per Merlise the tier exception is denied because there are new medicare rules for 2019 and a tier 4 non formulary drug cannot be lowered any further.  Will forward message to Dr Caryl Comes as the pt will need a less costing medication than Mexiletine.

## 2017-09-04 NOTE — Telephone Encounter (Signed)
Patient is calling to say that the entresto is too expensive please call back at  [phone]

## 2017-09-04 NOTE — Telephone Encounter (Signed)
Anthony Wolfe is calling about Anthony Wolfe medication Mexiltin 200 mg .Stating that it has gone up again and very expensive and they are unable to afford it . Was told to call back to see if they can get some assistance with it . Please call    Thank You

## 2017-09-04 NOTE — Telephone Encounter (Signed)
**Note De-Identified Anthony Wolfe Obfuscation** I called the pts pharmacy, [facility], and was advised that a 30 day supply of Mexiltine will cost the pt $85.43 and that the pt may have a deductible to meet. I was advised to ask to pt to contact his insurance company to see if he has a deductible to pay because the pharmacy states that a PA is not needed at this time.  I called the pts wife and asked her to call the pts insurance company to see if a deductible needs to be met. She refused stating that the pt is not going to pay for it so he wont be taking it and to please let Dr Caryl Comes know.  I encouraged her to call the pts insurance but again she said no and that unless we can get it cheaper the pt wont be taking it and to ask Dr Caryl Comes if there is another medication that the pt can take that is less expensive.

## 2017-09-05 NOTE — Telephone Encounter (Signed)
No cheaper version of this medicine which really seemed to help his breathing.  Would have to step down to valsartan 160 mg poqday alone.

## 2017-09-05 NOTE — Telephone Encounter (Signed)
Called and spoke to pt has this med is only $45 however with all his other medications that adds up and he is on a deducible plan. They would like to change to something cheaper.

## 2017-09-05 NOTE — Telephone Encounter (Signed)
No generic options.  Is this his deductible? (I.e.will it get better after he meets his deductible).  How much per month?  Depending on cost we could contact drug rep and tell her he cannot afford medicine since they say it should be $30-40 for all medicare.

## 2017-09-06 MED ORDER — VALSARTAN 160 MG PO TABS: 160.0000 mg | ORAL_TABLET | Freq: Every day | ORAL | 3 refills | Status: DC

## 2017-09-06 NOTE — Telephone Encounter (Signed)
LMTRC

## 2017-09-06 NOTE — Telephone Encounter (Signed)
Patient aware of providers recommendations and would like something cheaper - valsartan sent to pharm

## 2017-09-07 NOTE — Telephone Encounter (Signed)
We can hospitalize him and try him onsotalol as an alternative

## 2017-09-08 ENCOUNTER — Inpatient Hospital Stay: Payer: Medicare HMO | Attending: Hematology and Oncology

## 2017-09-08 ENCOUNTER — Other Ambulatory Visit: Payer: Self-pay | Admitting: Hematology and Oncology

## 2017-09-08 DIAGNOSIS — T451X5A Adverse effect of antineoplastic and immunosuppressive drugs, initial encounter: Secondary | ICD-10-CM | POA: Insufficient documentation

## 2017-09-08 DIAGNOSIS — K521 Toxic gastroenteritis and colitis: Secondary | ICD-10-CM | POA: Diagnosis not present

## 2017-09-08 DIAGNOSIS — C9210 Chronic myeloid leukemia, BCR/ABL-positive, not having achieved remission: Secondary | ICD-10-CM | POA: Insufficient documentation

## 2017-09-08 DIAGNOSIS — D6481 Anemia due to antineoplastic chemotherapy: Secondary | ICD-10-CM | POA: Insufficient documentation

## 2017-09-08 DIAGNOSIS — T50905A Adverse effect of unspecified drugs, medicaments and biological substances, initial encounter: Secondary | ICD-10-CM | POA: Insufficient documentation

## 2017-09-08 LAB — COMPREHENSIVE METABOLIC PANEL
ALT: 6 U/L (ref 0–55)
ANION GAP: 7 (ref 3–11)
AST: 10 U/L (ref 5–34)
Albumin: 3.4 g/dL — ABNORMAL LOW (ref 3.5–5.0)
Alkaline Phosphatase: 103 U/L (ref 40–150)
BUN: 9 mg/dL (ref 7–26)
CO2: 27 mmol/L (ref 22–29)
Calcium: 8.6 mg/dL (ref 8.4–10.4)
Chloride: 109 mmol/L (ref 98–109)
Creatinine, Ser: 1.11 mg/dL (ref 0.70–1.30)
GFR calc non Af Amer: >60 mL/min (ref >60)
Glucose, Bld: 76 mg/dL (ref 70–140)
POTASSIUM: 3.7 mmol/L (ref 3.5–5.1)
SODIUM: 143 mmol/L (ref 136–145)
Total Bilirubin: 0.5 mg/dL (ref 0.2–1.2)
Total Protein: 6.7 g/dL (ref 6.4–8.3)

## 2017-09-08 LAB — CBC WITH DIFFERENTIAL/PLATELET
Basophils Absolute: 0.1 10*3/uL (ref 0.0–0.1)
Basophils Relative: 1 %
Eosinophils Absolute: 0.1 10*3/uL (ref 0.0–0.5)
Eosinophils Relative: 2 %
HEMATOCRIT: 36 % — AB (ref 38.4–49.9)
HEMOGLOBIN: 11.4 g/dL — AB (ref 13.0–17.1)
Lymphocytes Relative: 17 %
Lymphs Abs: 0.8 10*3/uL — ABNORMAL LOW (ref 0.9–3.3)
MCH: 28.9 pg (ref 27.2–33.4)
MCHC: 31.7 g/dL — AB (ref 32.0–36.0)
MCV: 91.4 fL (ref 79.3–98.0)
MONOS PCT: 7 %
Monocytes Absolute: 0.3 10*3/uL (ref 0.1–0.9)
NEUTROS ABS: 3.6 10*3/uL (ref 1.5–6.5)
NEUTROS PCT: 73 %
Platelets: 144 10*3/uL (ref 140–400)
RBC: 3.94 MIL/uL — ABNORMAL LOW (ref 4.20–5.82)
RDW: 16.3 % — ABNORMAL HIGH (ref 11.0–14.6)
WBC: 4.9 10*3/uL (ref 4.0–10.3)

## 2017-09-08 NOTE — Telephone Encounter (Signed)
Spoke with pt's wife regarding starting Sotalol. She stated "He is not gonna let you take him to the hospital. What he's Seabron Spates do is just finish taking what he has left of the medicine and stop after that." I strongly advised her to not allow him to stop taking Mexiletine until he has spoken with Anthony Wolfe and found an alternative medication. I informed her if he stops his antiarrythmic, he is at a high risk of a "bad rhythm" and getting shocked and possibly not being able to be shocked back into rhythm. I encouraged her to make an appointment with Anthony Wolfe to discuss treatment options. She stated "I would call when he felt like coming to the doctor." Pt's wife encouraged Anthony Wolfe to speak with me, but he refused.  I told the pt's wife I would ask Anthony Wolfe for any additional advice.

## 2017-09-11 NOTE — Telephone Encounter (Signed)
Lets review this in am

## 2017-09-15 ENCOUNTER — Encounter: Payer: Self-pay | Admitting: Hematology and Oncology

## 2017-09-15 ENCOUNTER — Telehealth: Payer: Self-pay | Admitting: Hematology and Oncology

## 2017-09-15 ENCOUNTER — Inpatient Hospital Stay: Payer: Medicare HMO | Admitting: Hematology and Oncology

## 2017-09-15 DIAGNOSIS — T50905A Adverse effect of unspecified drugs, medicaments and biological substances, initial encounter: Secondary | ICD-10-CM

## 2017-09-15 DIAGNOSIS — D6481 Anemia due to antineoplastic chemotherapy: Secondary | ICD-10-CM | POA: Diagnosis not present

## 2017-09-15 DIAGNOSIS — T451X5A Adverse effect of antineoplastic and immunosuppressive drugs, initial encounter: Secondary | ICD-10-CM | POA: Diagnosis not present

## 2017-09-15 DIAGNOSIS — K521 Toxic gastroenteritis and colitis: Secondary | ICD-10-CM | POA: Diagnosis not present

## 2017-09-15 DIAGNOSIS — R197 Diarrhea, unspecified: Secondary | ICD-10-CM

## 2017-09-15 DIAGNOSIS — C9210 Chronic myeloid leukemia, BCR/ABL-positive, not having achieved remission: Secondary | ICD-10-CM | POA: Diagnosis not present

## 2017-09-15 MED ORDER — DIPHENOXYLATE-ATROPINE 2.5-0.025 MG PO TABS: 2.0000 | ORAL_TABLET | Freq: Four times a day (QID) | ORAL | 3 refills | Status: DC | PRN

## 2017-09-15 NOTE — Telephone Encounter (Signed)
Scheduled appts per 2/8 los - patient didn't want avs or calendar.

## 2017-09-15 NOTE — Assessment & Plan Note (Signed)
This is likely due to recent treatment. The patient denies recent history of bleeding such as epistaxis, hematuria or hematochezia. He is asymptomatic from the anemia. I will observe for now.    

## 2017-09-15 NOTE — Assessment & Plan Note (Signed)
The patient tolerated treatment well except for diarrhea. He has achieved major molecular response Based on current guidelines, he needs to be in MMR for at least 2 years before consideration to stop treatment I plan to see him back in 3 months

## 2017-09-15 NOTE — Assessment & Plan Note (Signed)
He is able to get his diarrhea under control with treatment. I recommend continues to take Imodium or Lomotil as needed to control his diarrhea and so far he is doing well 

## 2017-09-15 NOTE — Progress Notes (Signed)
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 BCR/ABL by PCR detected abnormalitis with b2a2 & b3a2 subtypes      05/09/2012 - 03/16/2015 Chemotherapy    He was started on treatment with Dasatinib 100 mg daily      06/06/2012 Adverse Reaction    Dose of medication was reduced to 50 mg daily due to pancytopenia      01/24/2013 Progression    Patient was noted to have elevated blood count which and was subsequently found to be noncompliant to treatment. The patient has not been on treatment for several months because his prescription ran out. He was restarted back on treatment      01/16/2014 Progression    Bcr/ABL by PCR is worse. Dose of Dasatinib was increased to 100 mg.      02/26/2014 Tumor Marker    Blood work for ABL kinase mutation was negative.      10/29/2014 Tumor Marker    BCR/ABL b2a2 & b3a2 0.29%, IS 0.1624%, not in MMR yet but improving      11/05/2014 Adverse Reaction    He had thoracentesis due to pleural effusion      11/27/2014 Surgery    He underwent right video-assisted thoracoscopy, Drainage of pleural effusion, Pleural biopsy, Diaphragm biopsy, Lung biopsy & [place] pleurodesis      01/30/2015 Tumor Marker    BCR/ABL b2a2 & b3a2 0.78%, IS 0.4368%, not in MMR       03/11/2015 Pathology Results    BCR/ABL b2a2 0.19%, IS 0.1064%, not in MMR       03/16/2015 Adverse Reaction    Dasatinib was stopped due to  recurrent pleural effusion      03/28/2015 -  Chemotherapy    He started on Bosutinib      03/30/2015 Procedure    He has therapeutic thoracentesis of the right lung with 1 liter of fluid removed      03/30/2015 Adverse Reaction    Bosutinib is placed on hold, to be restart on 8/25 at 250 mg due to severe diarrhea      04/28/2015 Tumor Marker    BCR/ABL b2a2 0.06%, IS 0.0336%, in MMR       06/10/2015 Tumor Marker    BCR/ABL b2a2 0.14%, IS 0.1162%, not in MMR       08/14/2015 Pathology Results    BCR/ABL b2a2 0.02%, IS 0.0166%, In MMR       11/09/2015 Tumor Marker    BCR/ABL b2a2 0.005%, IS 0.0042%, In MMR       02/08/2016 Tumor Marker    BCR/ABL undetectable. In MMR      06/13/2016 Tumor Marker    BCR/ABL undetectable. In MMR      12/06/2016 Pathology Results    BCR/ABL undetectable. In MMR      06/08/2017 Pathology Results    BCR/ABL undetectable. In MMR      09/08/2017 Pathology Results    BCR/ABL undetectable. In MMR       INTERVAL  HISTORY: Please see below for problem oriented charting. He returns with his wife for further follow-up He is doing well Denies recent infection Diarrhea is manageable with Imodium and Lomotil. He denies recent signs or symptoms of congestive heart failure  REVIEW OF SYSTEMS:   Constitutional: Denies fevers, chills or abnormal weight loss Eyes: Denies blurriness of vision Ears, nose, mouth, throat, and face: Denies mucositis or sore throat Respiratory: Denies cough, dyspnea or wheezes Cardiovascular: Denies palpitation, chest discomfort or lower extremity swelling Gastrointestinal:  Denies nausea, heartburn or change in bowel habits Skin: Denies abnormal skin rashes Lymphatics: Denies new lymphadenopathy or easy bruising Neurological:Denies numbness, tingling or new weaknesses Behavioral/Psych: Mood is stable, no new changes  All other systems were reviewed with the patient and are negative.  I have reviewed the past medical  history, past surgical history, social history and family history with the patient and they are unchanged from previous note.  ALLERGIES:  is allergic to norvasc [amlodipine besylate] and sulfa antibiotics.  MEDICATIONS:  Current Outpatient Medications  Medication Sig Dispense Refill  . atorvastatin (LIPITOR) 80 MG tablet TAKE ONE TABLET BY MOUTH ONCE DAILY AT  6PM. 90 tablet 4  . bosutinib (BOSULIF) 500 MG tablet Take 1 tablet (500 mg total) daily with breakfast by mouth. Take with food. 30 tablet 0  . carvedilol (COREG) 12.5 MG tablet Take 1 tablet (12.5 mg total) by mouth 2 (two) times daily with a meal. 180 tablet 3  . clopidogrel (PLAVIX) 75 MG tablet TAKE 1 TABLET BY MOUTH DAILY 90 tablet 3  . diazepam (VALIUM) 10 MG tablet Take 1 tablet (10 mg total) by mouth every 8 (eight) hours as needed for anxiety (take 1/2 to 1 tab every eight hours as needed for muscle spasm). 30 tablet 1  . diphenoxylate-atropine (LOMOTIL) 2.5-0.025 MG tablet Take 2 tablets by mouth 4 (four) times daily as needed for diarrhea or loose stools. 90 tablet 3  . furosemide (LASIX) 20 MG tablet Take 20 mg by mouth daily as needed for fluid or edema.    Marland Kitchen guaiFENesin (MUCINEX) 600 MG 12 hr tablet Take 1200 mg by mouth 2-3 times a day as needed for cough or to loosen phlegm    . mexiletine (MEXITIL) 200 MG capsule Take 1 capsule (200 mg total) by mouth 2 (two) times daily. 60 capsule 11  . nitroGLYCERIN (NITROSTAT) 0.4 MG SL tablet DISSOLVE 1 TABLET UNDER THE TONGUE EVERY 5 MINUTES AS NEEDED FOR CHEST PAIN 25 tablet 4  . ondansetron (ZOFRAN) 4 MG tablet Take 1 tablet (4 mg total) by mouth every 8 (eight) hours as needed for nausea or vomiting. 20 tablet 0  . predniSONE (DELTASONE) 20 MG tablet 3 tabs poqday 1-2, 2 tabs poqday 3-4, 1 tab poqday 5-6 12 tablet 0  . sildenafil (VIAGRA) 100 MG tablet Take 0.5-1 tablets (50-100 mg total) by mouth daily as needed for erectile dysfunction. 5 tablet 11  . valsartan (DIOVAN) 160 MG  tablet Take 1 tablet (160 mg total) by mouth daily. 90 tablet 3   No current facility-administered medications for this visit.     PHYSICAL EXAMINATION: ECOG PERFORMANCE STATUS: 1 - Symptomatic but completely ambulatory  Vitals:   09/15/17 0907  BP: (!) 155/68  Pulse: 62  Resp: 18  Temp: 98.3 F (36.8 C)  SpO2: 100%   Filed Weights   09/15/17 0907  Weight: 187 lb 8 oz (85 kg)    GENERAL:alert, no distress and comfortable SKIN: skin color, texture, turgor  are normal, no rashes or significant lesions EYES: normal, Conjunctiva are pink and non-injected, sclera clear OROPHARYNX:no exudate, no erythema and lips, buccal mucosa, and tongue normal  NECK: supple, thyroid normal size, non-tender, without nodularity LYMPH:  no palpable lymphadenopathy in the cervical, axillary or inguinal LUNGS: clear to auscultation and percussion with normal breathing effort HEART: regular rate & rhythm and no murmurs and no lower extremity edema ABDOMEN:abdomen soft, non-tender and normal bowel sounds Musculoskeletal:no cyanosis of digits and no clubbing  NEURO: alert & oriented x 3 with fluent speech, no focal motor/sensory deficits  LABORATORY DATA:  I have reviewed the data as listed    Component Value Date/Time   NA 143 09/08/2017 1052   NA 142 06/08/2017 0832   K 3.7 09/08/2017 1052   K 3.9 06/08/2017 0832   CL 109 09/08/2017 1052   CL 110 (H) 11/14/2012 0918   CO2 27 09/08/2017 1052   CO2 25 06/08/2017 0832   GLUCOSE 76 09/08/2017 1052   GLUCOSE 98 06/08/2017 0832   GLUCOSE 108 (H) 11/14/2012 0918   BUN 9 09/08/2017 1052   BUN 11.6 06/08/2017 0832   CREATININE 1.11 09/08/2017 1052   CREATININE 0.9 06/08/2017 0832   CALCIUM 8.6 09/08/2017 1052   CALCIUM 8.6 06/08/2017 0832   PROT 6.7 09/08/2017 1052   PROT 6.7 06/08/2017 0832   ALBUMIN 3.4 (L) 09/08/2017 1052   ALBUMIN 3.4 (L) 06/08/2017 0832   AST 10 09/08/2017 1052   AST 13 06/08/2017 0832   ALT 6 09/08/2017 1052   ALT 7  06/08/2017 0832   ALKPHOS 103 09/08/2017 1052   ALKPHOS 96 06/08/2017 0832   BILITOT 0.5 09/08/2017 1052   BILITOT 0.44 06/08/2017 0832   GFRNONAA >60 09/08/2017 1052   GFRNONAA 50 (L) 05/09/2017 1024   GFRAA >60 09/08/2017 1052   GFRAA 58 (L) 05/09/2017 1024    No results found for: SPEP, UPEP  Lab Results  Component Value Date   WBC 4.9 09/08/2017   NEUTROABS 3.6 09/08/2017   HGB 11.4 (L) 09/08/2017   HCT 36.0 (L) 09/08/2017   MCV 91.4 09/08/2017   PLT 144 09/08/2017      Chemistry      Component Value Date/Time   NA 143 09/08/2017 1052   NA 142 06/08/2017 0832   K 3.7 09/08/2017 1052   K 3.9 06/08/2017 0832   CL 109 09/08/2017 1052   CL 110 (H) 11/14/2012 0918   CO2 27 09/08/2017 1052   CO2 25 06/08/2017 0832   BUN 9 09/08/2017 1052   BUN 11.6 06/08/2017 0832   CREATININE 1.11 09/08/2017 1052   CREATININE 0.9 06/08/2017 0832      Component Value Date/Time   CALCIUM 8.6 09/08/2017 1052   CALCIUM 8.6 06/08/2017 0832   ALKPHOS 103 09/08/2017 1052   ALKPHOS 96 06/08/2017 0832   AST 10 09/08/2017 1052   AST 13 06/08/2017 0832   ALT 6 09/08/2017 1052   ALT 7 06/08/2017 0832   BILITOT 0.5 09/08/2017 1052   BILITOT 0.44 06/08/2017 0832     ASSESSMENT & PLAN:  Chronic myelogenous leukemia The patient tolerated treatment well except for diarrhea. He has achieved major molecular response Based on current guidelines, he needs to be in MMR for at least 2 years before consideration to stop treatment I plan to see him back in 3 months   Anemia due to antineoplastic chemotherapy This is likely due to recent treatment. The patient denies recent history of bleeding such as  epistaxis, hematuria or hematochezia. He is asymptomatic from the anemia. I will observe for now  Diarrhea due to drug He is able to get his diarrhea under control with treatment. I recommend continues to take Imodium or Lomotil as needed to control his diarrhea and so far he is doing well   No  orders of the defined types were placed in this encounter.  All questions were answered. The patient knows to call the clinic with any problems, questions or concerns. No barriers to learning was detected. I spent 15 minutes counseling the patient face to face. The total time spent in the appointment was 20 minutes and more than 50% was on counseling and review of test results     [name], MD 09/15/2017 9:45 AM

## 2017-09-18 ENCOUNTER — Telehealth: Payer: Self-pay | Admitting: Cardiology

## 2017-09-18 ENCOUNTER — Encounter: Payer: Medicare HMO | Admitting: *Deleted

## 2017-09-18 NOTE — Telephone Encounter (Signed)
Spoke with pt and reminded pt of remote transmission that is due today. Pt verbalized understanding.   

## 2017-09-20 ENCOUNTER — Encounter: Payer: Self-pay | Admitting: Cardiology

## 2017-09-20 LAB — BCR/ABL

## 2017-09-29 ENCOUNTER — Other Ambulatory Visit: Payer: Self-pay | Admitting: Hematology and Oncology

## 2017-09-29 DIAGNOSIS — R197 Diarrhea, unspecified: Secondary | ICD-10-CM

## 2017-09-29 NOTE — Telephone Encounter (Signed)
Can we call this in?

## 2017-10-02 ENCOUNTER — Other Ambulatory Visit: Payer: Self-pay | Admitting: *Deleted

## 2017-10-02 DIAGNOSIS — R197 Diarrhea, unspecified: Secondary | ICD-10-CM

## 2017-10-02 MED ORDER — DIPHENOXYLATE-ATROPINE 2.5-0.025 MG PO TABS: ORAL_TABLET | ORAL | 3 refills | Status: DC

## 2017-10-02 NOTE — Telephone Encounter (Signed)
pls call in

## 2017-10-17 ENCOUNTER — Ambulatory Visit (INDEPENDENT_AMBULATORY_CARE_PROVIDER_SITE_OTHER): Payer: Medicare HMO | Admitting: *Deleted

## 2017-10-17 DIAGNOSIS — I472 Ventricular tachycardia, unspecified: Secondary | ICD-10-CM

## 2017-10-17 DIAGNOSIS — I255 Ischemic cardiomyopathy: Secondary | ICD-10-CM

## 2017-10-18 ENCOUNTER — Encounter: Payer: Self-pay | Admitting: Cardiology

## 2017-10-18 NOTE — Progress Notes (Signed)
Remote ICD transmission.   

## 2017-10-25 LAB — CUP PACEART REMOTE DEVICE CHECK
Battery Remaining Longevity: 105 mo
Brady Statistic RV Percent Paced: 0.11 %
Date Time Interrogation Session: 20190312182849
HIGH POWER IMPEDANCE MEASURED VALUE: 48 Ohm
Implantable Lead Implant Date: 20151014
Lead Channel Impedance Value: 361 Ohm
Lead Channel Impedance Value: 399 Ohm
Lead Channel Pacing Threshold Amplitude: 1.125 V
Lead Channel Sensing Intrinsic Amplitude: 8.5 mV
Lead Channel Sensing Intrinsic Amplitude: 8.5 mV
Lead Channel Setting Pacing Pulse Width: 0.4 ms
MDC IDC LEAD LOCATION: 753860
MDC IDC MSMT BATTERY VOLTAGE: 2.95 V
MDC IDC MSMT LEADCHNL RV PACING THRESHOLD PULSEWIDTH: 0.4 ms
MDC IDC PG IMPLANT DT: 20151014
MDC IDC SET LEADCHNL RV PACING AMPLITUDE: 2.25 V
MDC IDC SET LEADCHNL RV SENSING SENSITIVITY: 0.3 mV

## 2017-11-06 ENCOUNTER — Other Ambulatory Visit: Payer: Self-pay | Admitting: Family Medicine

## 2017-11-06 NOTE — Telephone Encounter (Signed)
Requesting refill    Valium  LOV: 07/10/17  LRF:  07/10/17

## 2017-11-07 ENCOUNTER — Encounter: Payer: Self-pay | Admitting: Family Medicine

## 2017-11-07 ENCOUNTER — Ambulatory Visit (INDEPENDENT_AMBULATORY_CARE_PROVIDER_SITE_OTHER): Payer: Medicare HMO | Admitting: Family Medicine

## 2017-11-07 VITALS — BP 152/80 | HR 60 | Temp 98.0°F | Resp 16 | Ht 69.0 in | Wt 189.0 lb

## 2017-11-07 DIAGNOSIS — R1013 Epigastric pain: Secondary | ICD-10-CM

## 2017-11-07 NOTE — Progress Notes (Signed)
Subjective:    Patient ID: Anthony Wolfe, male    DOB: 08-23-1945, 72 y.o.   MRN: [id number]  HPI Up until last week, the patient was doing well.  Starting last week he developed a queasy feeling in his stomach.  He reported early satiety, no appetite, nausea, he has chronic diarrhea.  He denies fever.  He denies melena.  He denies hematochezia.  He denies any vomiting.  Symptoms lasted 3 or 4 days.  2 days ago, he took Pepto-Bismol.  After that, his symptoms subsided.  He continues to have chronic diarrhea likely related to his cancer medication but otherwise the GI symptoms have completely gone away Past Medical History:  Diagnosis Date  . AICD (automatic cardioverter/defibrillator) present   . Allergic rhinitis   . 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 Anxiety   . Blood dyscrasia    cmleukemia  . C. difficile colitis 06/16/2014  . CAD (coronary artery disease)    a. s/p 3v CABG 2010. b. NSTEMI 05/2014 in setting of VT. Cath impressions: "Recent IMI with occluded SVG to RCA. Has Right to Right collaterals and collaterals from septal and OM. EF 40%."  . Carotid disease, bilateral ([place])    a. Right CEA 2002; known occluded left carotid. b. Duplex 06/2014: known occluded LICA, stable 6-73% RICA s/p CEA.  . Chronic systolic CHF (congestive heart failure) ([place])   . CML (chronic myelocytic leukemia) ([place]) 05/09/2012  . Depression   . Emphysema of lung ([place])   . HTN (hypertension)   . Hyperlipidemia   . Ischemic cardiomyopathy    a. Prior EF 36%. b. 2014: 50%. c. 05/2014: EF 35-40% by echo, 40% with inf HK by cath.  . NSTEMI (non-ST elevation myocardial infarction) ([place]) 05/2014  . Pleural effusion 05/2014  . Pneumonia 05/23/2014  . Presence of permanent cardiac pacemaker   . Prostate hypertrophy   . PVC's (premature ventricular contractions)    a. PVC's with prior Holter showing PVC load of 22%.  . Shortness of breath dyspnea   . Stroke ([place]) 01  . TIA (transient ischemic attack)     ASPVD, S./P. right CEA, 2002, and known occluded left carotid  . Ventricular tachycardia ([place])    a. Admitted with VT 05/2013 - EPS with inducible sustained monomorphic VT; s/p Medtronic ICD 05/21/14.   Past Surgical History:  Procedure Laterality Date  . BACK SURGERY    . CAROTID ENDARTERECTOMY Right 01  . carotidectomy  2003   right side  . COLONOSCOPY  07/2010  . CORONARY ARTERY BYPASS GRAFT  02/2009   3 vessel  . ELECTROPHYSIOLOGIC STUDY  05/21/14  . ELECTROPHYSIOLOGY STUDY N/A 05/21/2014   Procedure: ELECTROPHYSIOLOGY STUDY;  Surgeon: Deboraha Sprang, MD;  Location: [facility] CATH LAB;  Service: Cardiovascular;  Laterality: N/A;  . EP IMPLANTABLE DEVICE  05/21/14   single chamber Metronic ICD  . LEFT HEART CATHETERIZATION WITH CORONARY/GRAFT ANGIOGRAM N/A 05/19/2014   Procedure: LEFT HEART CATHETERIZATION WITH Beatrix Fetters;  Surgeon: Josue Hector, MD;  Location: [facility] CATH LAB;  Service: Cardiovascular;  Laterality: N/A;  . PLEURAL BIOPSY N/A 11/27/2014   Procedure: PLEURAL BIOPSY;  Surgeon: [name], MD;  Location: [place];  Service: Thoracic;  Laterality: N/A;  . PLEURAL EFFUSION DRAINAGE Right 11/27/2014   Procedure: DRAINAGE OF PLEURAL EFFUSION;  Surgeon: [name], MD;  Location: [place];  Service: Thoracic;  Laterality: Right;  . TALC PLEURODESIS Right 11/27/2014   Procedure: TALC PLEURADESIS;  Surgeon:  [name], MD;  Location: [place];  Service: Thoracic;  Laterality: Right;  Marland Kitchen VIDEO ASSISTED THORACOSCOPY Right 11/27/2014   Procedure: RIGHT VIDEO ASSISTED THORACOSCOPY;  Surgeon: [name], MD;  Location: [place];  Service: Thoracic;  Laterality: Right;   Current Outpatient Medications on File Prior to Visit  Medication Sig Dispense Refill  . atorvastatin (LIPITOR) 80 MG tablet TAKE ONE TABLET BY MOUTH ONCE DAILY AT  6PM. 90 tablet 4  . bosutinib (BOSULIF) 500 MG tablet Take 1 tablet (500 mg total) daily with breakfast by mouth. Take  with food. 30 tablet 0  . carvedilol (COREG) 12.5 MG tablet Take 1 tablet (12.5 mg total) by mouth 2 (two) times daily with a meal. 180 tablet 3  . clopidogrel (PLAVIX) 75 MG tablet TAKE 1 TABLET BY MOUTH DAILY 90 tablet 3  . diazepam (VALIUM) 10 MG tablet TAKE 1/2 TO 1 (ONE-HALF TO ONE) TABLET BY MOUTH EVERY 8 HOURS AS NEEDED FOR MUSCLE SPASM OR ANXIETY 30 tablet 1  . diphenoxylate-atropine (LOMOTIL) 2.5-0.025 MG tablet TAKE 2 TABLETS BY MOUTH FOUR TIMES A DAY AS NEEDED FOR DIARRHEA OR LOOSE STOOLS 30 tablet 3  . furosemide (LASIX) 20 MG tablet Take 20 mg by mouth daily as needed for fluid or edema.    Marland Kitchen guaiFENesin (MUCINEX) 600 MG 12 hr tablet Take 1200 mg by mouth 2-3 times a day as needed for cough or to loosen phlegm    . mexiletine (MEXITIL) 200 MG capsule Take 1 capsule (200 mg total) by mouth 2 (two) times daily. 60 capsule 11  . nitroGLYCERIN (NITROSTAT) 0.4 MG SL tablet DISSOLVE 1 TABLET UNDER THE TONGUE EVERY 5 MINUTES AS NEEDED FOR CHEST PAIN 25 tablet 4  . ondansetron (ZOFRAN) 4 MG tablet Take 1 tablet (4 mg total) by mouth every 8 (eight) hours as needed for nausea or vomiting. 20 tablet 0  . sildenafil (VIAGRA) 100 MG tablet Take 0.5-1 tablets (50-100 mg total) by mouth daily as needed for erectile dysfunction. 5 tablet 11  . valsartan (DIOVAN) 160 MG tablet Take 1 tablet (160 mg total) by mouth daily. 90 tablet 3   No current facility-administered medications on file prior to visit.    Allergies  Allergen Reactions  . Norvasc [Amlodipine Besylate] Rash    rash  . Sulfa Antibiotics Hives    Hives    Social History   Socioeconomic History  . Marital status: Married    Spouse name: Not on file  . Number of children: 2  . Years of education: Not on file  . Highest education level: Not on file  Occupational History    Comment: retired Development worker, international aid  Social Needs  . Financial resource strain: Not on file  . Food insecurity:    Worry: Not on file    Inability: Not on file    . Transportation needs:    Medical: Not on file    Non-medical: Not on file  Tobacco Use  . Smoking status: Former Smoker    Packs/day: 3.00    Years: 45.00    Pack years: 135.00    Types: Cigarettes    Last attempt to quit: 08/08/1997    Years since quitting: 20.2  . Smokeless tobacco: Former Network engineer and Sexual Activity  . Alcohol use: No    Alcohol/week: 0.0 oz  . Drug use: No  . Sexual activity: Yes  Lifestyle  . Physical activity:    Days per week: Not on file  Minutes per session: Not on file  . Stress: Not on file  Relationships  . Social connections:    Talks on phone: Not on file    Gets together: Not on file    Attends religious service: Not on file    Active member of club or organization: Not on file    Attends meetings of clubs or organizations: Not on file    Relationship status: Not on file  . Intimate partner violence:    Fear of current or ex partner: Not on file    Emotionally abused: Not on file    Physically abused: Not on file    Forced sexual activity: Not on file  Other Topics Concern  . Not on file  Social History Narrative  . Not on file      Review of Systems  All other systems reviewed and are negative.      Objective:   Physical Exam  Constitutional: He appears well-developed and well-nourished.  Cardiovascular: Normal rate, regular rhythm and normal heart sounds.  No murmur heard. Pulmonary/Chest: Effort normal and breath sounds normal. No respiratory distress. He has no wheezes. He has no rales.  Abdominal: Soft. Bowel sounds are normal. He exhibits no distension and no mass. There is no tenderness. There is no rebound and no guarding.  Vitals reviewed.         Assessment & Plan:  Dyspepsia  Exam is reassuring.  Symptoms have completely subsided.  Patient was concerned the medication valsartan was causing his symptoms.  He saw an ad on television stating that this medication can cause cancer and he was worried  that he may have stomach cancer.  I reassured the patient that I am certain that the valsartan was not causing any of his symptoms.  I also tried to assuage his fears regarding the linkage between valsartan, the contaminant NDEA, and cancer.  His manufacturer has not been recalled.  I recommended that he remain on the medication given his underlying congestive heart failure especially since he cannot afford entresto.  For the time being and we will clinically monitor the patient.  If symptoms return, we will institute a more thorough GI workup.  At the present time I suspect that this may have just been a mild stomach virus or possibly related to his bosulif.

## 2017-12-07 ENCOUNTER — Telehealth: Payer: Self-pay | Admitting: Family Medicine

## 2017-12-07 NOTE — Telephone Encounter (Signed)
Patient's wife called [facility] stating that pt would like to switch back to [facility] instead of valsartan as it helped his breathing better.

## 2017-12-11 ENCOUNTER — Inpatient Hospital Stay: Payer: Medicare HMO | Attending: Hematology and Oncology

## 2017-12-11 ENCOUNTER — Other Ambulatory Visit: Payer: Self-pay | Admitting: Hematology and Oncology

## 2017-12-11 DIAGNOSIS — K521 Toxic gastroenteritis and colitis: Secondary | ICD-10-CM | POA: Insufficient documentation

## 2017-12-11 DIAGNOSIS — I255 Ischemic cardiomyopathy: Secondary | ICD-10-CM | POA: Diagnosis not present

## 2017-12-11 DIAGNOSIS — C9210 Chronic myeloid leukemia, BCR/ABL-positive, not having achieved remission: Secondary | ICD-10-CM | POA: Insufficient documentation

## 2017-12-11 DIAGNOSIS — T451X5A Adverse effect of antineoplastic and immunosuppressive drugs, initial encounter: Secondary | ICD-10-CM | POA: Diagnosis not present

## 2017-12-11 DIAGNOSIS — J449 Chronic obstructive pulmonary disease, unspecified: Secondary | ICD-10-CM | POA: Diagnosis not present

## 2017-12-11 DIAGNOSIS — J90 Pleural effusion, not elsewhere classified: Secondary | ICD-10-CM | POA: Insufficient documentation

## 2017-12-11 LAB — CBC WITH DIFFERENTIAL/PLATELET
BASOS ABS: 0.1 10*3/uL (ref 0.0–0.1)
Basophils Relative: 1 %
EOS ABS: 0.1 10*3/uL (ref 0.0–0.5)
EOS PCT: 2 %
HCT: 37.3 % — ABNORMAL LOW (ref 38.4–49.9)
Hemoglobin: 11.7 g/dL — ABNORMAL LOW (ref 13.0–17.1)
LYMPHS PCT: 16 %
Lymphs Abs: 0.9 10*3/uL (ref 0.9–3.3)
MCH: 29.5 pg (ref 27.2–33.4)
MCHC: 31.4 g/dL — ABNORMAL LOW (ref 32.0–36.0)
MCV: 94 fL (ref 79.3–98.0)
MONO ABS: 0.5 10*3/uL (ref 0.1–0.9)
Monocytes Relative: 9 %
Neutro Abs: 4.1 10*3/uL (ref 1.5–6.5)
Neutrophils Relative %: 72 %
PLATELETS: 159 10*3/uL (ref 140–400)
RBC: 3.97 MIL/uL — AB (ref 4.20–5.82)
RDW: 15.3 % — AB (ref 11.0–14.6)
WBC: 5.7 10*3/uL (ref 4.0–10.3)

## 2017-12-11 LAB — COMPREHENSIVE METABOLIC PANEL
ALT: 9 U/L (ref 0–55)
AST: 12 U/L (ref 5–34)
Albumin: 3.5 g/dL (ref 3.5–5.0)
Alkaline Phosphatase: 97 U/L (ref 40–150)
Anion gap: 4 (ref 3–11)
BUN: 12 mg/dL (ref 7–26)
CHLORIDE: 111 mmol/L — AB (ref 98–109)
CO2: 28 mmol/L (ref 22–29)
Calcium: 8.8 mg/dL (ref 8.4–10.4)
Creatinine, Ser: 1.09 mg/dL (ref 0.70–1.30)
Glucose, Bld: 109 mg/dL (ref 70–140)
POTASSIUM: 4.5 mmol/L (ref 3.5–5.1)
SODIUM: 143 mmol/L (ref 136–145)
Total Bilirubin: 0.3 mg/dL (ref 0.2–1.2)
Total Protein: 6.4 g/dL (ref 6.4–8.3)

## 2017-12-11 MED ORDER — SACUBITRIL-VALSARTAN 24-26 MG PO TABS: 1.0000 | ORAL_TABLET | Freq: Two times a day (BID) | ORAL | 3 refills | Status: DC

## 2017-12-11 NOTE — Telephone Encounter (Signed)
Med sent to pharm and pt aware via vm also aware to schedule apt in 2 weeks.

## 2017-12-11 NOTE — Telephone Encounter (Signed)
DC valsartan and start entresto 24/26 bid and recheck labs and bp in 2 weeks.

## 2017-12-18 ENCOUNTER — Encounter: Payer: Self-pay | Admitting: Hematology and Oncology

## 2017-12-18 ENCOUNTER — Inpatient Hospital Stay (HOSPITAL_BASED_OUTPATIENT_CLINIC_OR_DEPARTMENT_OTHER): Payer: Medicare HMO | Admitting: Hematology and Oncology

## 2017-12-18 ENCOUNTER — Telehealth: Payer: Self-pay | Admitting: Hematology and Oncology

## 2017-12-18 DIAGNOSIS — J90 Pleural effusion, not elsewhere classified: Secondary | ICD-10-CM

## 2017-12-18 DIAGNOSIS — T451X5A Adverse effect of antineoplastic and immunosuppressive drugs, initial encounter: Secondary | ICD-10-CM

## 2017-12-18 DIAGNOSIS — D6481 Anemia due to antineoplastic chemotherapy: Secondary | ICD-10-CM | POA: Diagnosis not present

## 2017-12-18 DIAGNOSIS — K521 Toxic gastroenteritis and colitis: Secondary | ICD-10-CM

## 2017-12-18 DIAGNOSIS — I255 Ischemic cardiomyopathy: Secondary | ICD-10-CM | POA: Diagnosis not present

## 2017-12-18 DIAGNOSIS — C9210 Chronic myeloid leukemia, BCR/ABL-positive, not having achieved remission: Secondary | ICD-10-CM

## 2017-12-18 DIAGNOSIS — J449 Chronic obstructive pulmonary disease, unspecified: Secondary | ICD-10-CM | POA: Diagnosis not present

## 2017-12-18 NOTE — Assessment & Plan Note (Signed)
This is likely due to recent treatment. The patient denies recent history of bleeding such as epistaxis, hematuria or hematochezia. He is asymptomatic from the anemia. I will observe for now.    

## 2017-12-18 NOTE — Progress Notes (Signed)
[place] OFFICE PROGRESS NOTE  Patient Care Team: [name], MD as PCP - General (Family Medicine) [name]., MD as Attending Physician (Cardiology) [name], MD (Family Medicine) [name], MD (Cardiothoracic Surgery) Early, Arvilla Meres, MD as Attending Physician (Vascular Surgery) [name], MD as Consulting Physician (Hematology and Oncology) Deboraha Sprang, MD as Consulting Physician (Cardiology)  ASSESSMENT & PLAN:  Chronic myelogenous leukemia The patient tolerated treatment well except for diarrhea. He has achieved major molecular response Based on current guidelines, he needs to be in MMR for at least 2 years before consideration to stop treatment I plan to see him back in 3 months   Anemia due to antineoplastic chemotherapy This is likely due to recent treatment. The patient denies recent history of bleeding such as epistaxis, hematuria or hematochezia. He is asymptomatic from the anemia. I will observe for now  Diarrhea due to drug He is able to get his diarrhea under control with treatment. I recommend continues to take Imodium or Lomotil as needed to control his diarrhea and so far he is doing well  Cardiomyopathy, ischemic  I suspect his shortness of breath is multifactorial, combination of congestive heart failure from cardiomyopathy, pleural effusion and mild COPD. Clinically, he does not have signs of volume overload. He will continue medical management.   No orders of the defined types were placed in this encounter.   INTERVAL HISTORY: Please see below for problem oriented charting. He returns for further follow-up He has diarrhea, well controlled with Imodium or Lomotil He denies recent cardiac issues such as chest pain or shortness of breath He is taking his medications as prescribed without missing doses He denies recent infection The patient denies any recent signs or symptoms of bleeding such as  spontaneous epistaxis, hematuria or hematochezia.   SUMMARY OF ONCOLOGIC HISTORY:   Chronic myelogenous leukemia ([place])   05/04/2012 Bone Marrow Biopsy    BM confirmed diagnosis of CML in Chronic phase. BCR/ABL by PCR detected abnormalitis with b2a2 & b3a2 subtypes      05/09/2012 - 03/16/2015 Chemotherapy    He was started on treatment with Dasatinib 100 mg daily      06/06/2012 Adverse Reaction    Dose of medication was reduced to 50 mg daily due to pancytopenia      01/24/2013 Progression    Patient was noted to have elevated blood count which and was subsequently found to be noncompliant to treatment. The patient has not been on treatment for several months because his prescription ran out. He was restarted back on treatment      01/16/2014 Progression    Bcr/ABL by PCR is worse. Dose of Dasatinib was increased to 100 mg.      02/26/2014 Tumor Marker    Blood work for ABL kinase mutation was negative.      10/29/2014 Tumor Marker    BCR/ABL b2a2 & b3a2 0.29%, IS 0.1624%, not in MMR yet but improving      11/05/2014 Adverse Reaction    He had thoracentesis due to pleural effusion      11/27/2014 Surgery    He underwent right video-assisted thoracoscopy, Drainage of pleural effusion, Pleural biopsy, Diaphragm biopsy, Lung biopsy & [place] pleurodesis      01/30/2015 Tumor Marker    BCR/ABL b2a2 & b3a2 0.78%, IS 0.4368%, not in MMR       03/11/2015 Pathology Results    BCR/ABL b2a2 0.19%, IS 0.1064%, not in MMR  03/16/2015 Adverse Reaction    Dasatinib was stopped due to recurrent pleural effusion      03/28/2015 -  Chemotherapy    He started on Bosutinib      03/30/2015 Procedure    He has therapeutic thoracentesis of the right lung with 1 liter of fluid removed      03/30/2015 Adverse Reaction    Bosutinib is placed on hold, to be restart on 8/25 at 250 mg due to severe diarrhea      04/28/2015 Tumor Marker    BCR/ABL b2a2 0.06%, IS 0.0336%, in MMR        06/10/2015 Tumor Marker    BCR/ABL b2a2 0.14%, IS 0.1162%, not in MMR       08/14/2015 Pathology Results    BCR/ABL b2a2 0.02%, IS 0.0166%, In MMR       11/09/2015 Tumor Marker    BCR/ABL b2a2 0.005%, IS 0.0042%, In MMR       02/08/2016 Tumor Marker    BCR/ABL undetectable. In MMR      06/13/2016 Tumor Marker    BCR/ABL undetectable. In MMR      12/06/2016 Pathology Results    BCR/ABL undetectable. In MMR      06/08/2017 Pathology Results    BCR/ABL undetectable. In MMR      09/08/2017 Pathology Results    BCR/ABL undetectable. In MMR      12/11/2017 Pathology Results    BCR/ABL undetectable. In MMR       REVIEW OF SYSTEMS:   Constitutional: Denies fevers, chills or abnormal weight loss Eyes: Denies blurriness of vision Ears, nose, mouth, throat, and face: Denies mucositis or sore throat Respiratory: Denies cough, dyspnea or wheezes Cardiovascular: Denies palpitation, chest discomfort or lower extremity swelling Skin: Denies abnormal skin rashes Lymphatics: Denies new lymphadenopathy or easy bruising Neurological:Denies numbness, tingling or new weaknesses Behavioral/Psych: Mood is stable, no new changes  All other systems were reviewed with the patient and are negative.  I have reviewed the past medical history, past surgical history, social history and family history with the patient and they are unchanged from previous note.  ALLERGIES:  is allergic to norvasc [amlodipine besylate] and sulfa antibiotics.  MEDICATIONS:  Current Outpatient Medications  Medication Sig Dispense Refill  . atorvastatin (LIPITOR) 80 MG tablet TAKE ONE TABLET BY MOUTH ONCE DAILY AT  6PM. 90 tablet 4  . bosutinib (BOSULIF) 500 MG tablet Take 1 tablet (500 mg total) daily with breakfast by mouth. Take with food. 30 tablet 0  . carvedilol (COREG) 12.5 MG tablet Take 1 tablet (12.5 mg total) by mouth 2 (two) times daily with a meal. 180 tablet 3  . clopidogrel (PLAVIX) 75 MG tablet TAKE 1 TABLET  BY MOUTH DAILY 90 tablet 3  . diazepam (VALIUM) 10 MG tablet TAKE 1/2 TO 1 (ONE-HALF TO ONE) TABLET BY MOUTH EVERY 8 HOURS AS NEEDED FOR MUSCLE SPASM OR ANXIETY 30 tablet 1  . diphenoxylate-atropine (LOMOTIL) 2.5-0.025 MG tablet TAKE 2 TABLETS BY MOUTH FOUR TIMES A DAY AS NEEDED FOR DIARRHEA OR LOOSE STOOLS 30 tablet 3  . furosemide (LASIX) 20 MG tablet Take 20 mg by mouth daily as needed for fluid or edema.    Marland Kitchen guaiFENesin (MUCINEX) 600 MG 12 hr tablet Take 1200 mg by mouth 2-3 times a day as needed for cough or to loosen phlegm    . mexiletine (MEXITIL) 200 MG capsule Take 1 capsule (200 mg total) by mouth 2 (two) times daily. 60 capsule 11  .  nitroGLYCERIN (NITROSTAT) 0.4 MG SL tablet DISSOLVE 1 TABLET UNDER THE TONGUE EVERY 5 MINUTES AS NEEDED FOR CHEST PAIN 25 tablet 4  . ondansetron (ZOFRAN) 4 MG tablet Take 1 tablet (4 mg total) by mouth every 8 (eight) hours as needed for nausea or vomiting. 20 tablet 0  . sacubitril-valsartan (ENTRESTO) 24-26 MG Take 1 tablet by mouth 2 (two) times daily. 60 tablet 3  . sildenafil (VIAGRA) 100 MG tablet Take 0.5-1 tablets (50-100 mg total) by mouth daily as needed for erectile dysfunction. 5 tablet 11   No current facility-administered medications for this visit.     PHYSICAL EXAMINATION: ECOG PERFORMANCE STATUS: 1 - Symptomatic but completely ambulatory  Vitals:   12/18/17 0835  BP: (!) 148/77  Pulse: 76  Resp: 18  Temp: 97.9 F (36.6 C)  SpO2: 100%   Filed Weights   12/18/17 0835  Weight: 187 lb 1.6 oz (84.9 kg)    GENERAL:alert, no distress and comfortable SKIN: skin color, texture, turgor are normal, no rashes or significant lesions EYES: normal, Conjunctiva are pink and non-injected, sclera clear OROPHARYNX:no exudate, no erythema and lips, buccal mucosa, and tongue normal  NECK: supple, thyroid normal size, non-tender, without nodularity LYMPH:  no palpable lymphadenopathy in the cervical, axillary or inguinal LUNGS: clear to  auscultation and percussion with normal breathing effort HEART: regular rate & rhythm and no murmurs and no lower extremity edema ABDOMEN:abdomen soft, non-tender and normal bowel sounds Musculoskeletal:no cyanosis of digits and no clubbing  NEURO: alert & oriented x 3 with fluent speech, no focal motor/sensory deficits  LABORATORY DATA:  I have reviewed the data as listed    Component Value Date/Time   NA 143 12/11/2017 0842   NA 142 06/08/2017 0832   K 4.5 12/11/2017 0842   K 3.9 06/08/2017 0832   CL 111 (H) 12/11/2017 0842   CL 110 (H) 11/14/2012 0918   CO2 28 12/11/2017 0842   CO2 25 06/08/2017 0832   GLUCOSE 109 12/11/2017 0842   GLUCOSE 98 06/08/2017 0832   GLUCOSE 108 (H) 11/14/2012 0918   BUN 12 12/11/2017 0842   BUN 11.6 06/08/2017 0832   CREATININE 1.09 12/11/2017 0842   CREATININE 0.9 06/08/2017 0832   CALCIUM 8.8 12/11/2017 0842   CALCIUM 8.6 06/08/2017 0832   PROT 6.4 12/11/2017 0842   PROT 6.7 06/08/2017 0832   ALBUMIN 3.5 12/11/2017 0842   ALBUMIN 3.4 (L) 06/08/2017 0832   AST 12 12/11/2017 0842   AST 13 06/08/2017 0832   ALT 9 12/11/2017 0842   ALT 7 06/08/2017 0832   ALKPHOS 97 12/11/2017 0842   ALKPHOS 96 06/08/2017 0832   BILITOT 0.3 12/11/2017 0842   BILITOT 0.44 06/08/2017 0832   GFRNONAA >60 12/11/2017 0842   GFRNONAA 50 (L) 05/09/2017 1024   GFRAA >60 12/11/2017 0842   GFRAA 58 (L) 05/09/2017 1024    No results found for: SPEP, UPEP  Lab Results  Component Value Date   WBC 5.7 12/11/2017   NEUTROABS 4.1 12/11/2017   HGB 11.7 (L) 12/11/2017   HCT 37.3 (L) 12/11/2017   MCV 94.0 12/11/2017   PLT 159 12/11/2017      Chemistry      Component Value Date/Time   NA 143 12/11/2017 0842   NA 142 06/08/2017 0832   K 4.5 12/11/2017 0842   K 3.9 06/08/2017 0832   CL 111 (H) 12/11/2017 0842   CL 110 (H) 11/14/2012 0918   CO2 28 12/11/2017 0842   CO2  25 06/08/2017 0832   BUN 12 12/11/2017 0842   BUN 11.6 06/08/2017 0832   CREATININE 1.09  12/11/2017 0842   CREATININE 0.9 06/08/2017 0832      Component Value Date/Time   CALCIUM 8.8 12/11/2017 0842   CALCIUM 8.6 06/08/2017 0832   ALKPHOS 97 12/11/2017 0842   ALKPHOS 96 06/08/2017 0832   AST 12 12/11/2017 0842   AST 13 06/08/2017 0832   ALT 9 12/11/2017 0842   ALT 7 06/08/2017 0832   BILITOT 0.3 12/11/2017 0842   BILITOT 0.44 06/08/2017 0832       All questions were answered. The patient knows to call the clinic with any problems, questions or concerns. No barriers to learning was detected.  I spent 15 minutes counseling the patient face to face. The total time spent in the appointment was 20 minutes and more than 50% was on counseling and review of test results  [name], MD 12/18/2017 8:47 AM

## 2017-12-18 NOTE — Assessment & Plan Note (Signed)
I suspect his shortness of breath is multifactorial, combination of congestive heart failure from cardiomyopathy, pleural effusion and mild COPD. Clinically, he does not have signs of volume overload. He will continue medical management.

## 2017-12-18 NOTE — Telephone Encounter (Signed)
Gave patient AVs and calendar of upcoming august appointments.  °

## 2017-12-18 NOTE — Assessment & Plan Note (Signed)
He is able to get his diarrhea under control with treatment. I recommend continues to take Imodium or Lomotil as needed to control his diarrhea and so far he is doing well 

## 2017-12-18 NOTE — Assessment & Plan Note (Signed)
The patient tolerated treatment well except for diarrhea. He has achieved major molecular response Based on current guidelines, he needs to be in MMR for at least 2 years before consideration to stop treatment I plan to see him back in 3 months  

## 2017-12-22 LAB — BCR/ABL

## 2018-01-05 ENCOUNTER — Encounter: Payer: Self-pay | Admitting: Family Medicine

## 2018-01-05 ENCOUNTER — Ambulatory Visit (INDEPENDENT_AMBULATORY_CARE_PROVIDER_SITE_OTHER): Payer: Medicare HMO | Admitting: Family Medicine

## 2018-01-05 ENCOUNTER — Other Ambulatory Visit: Payer: Self-pay | Admitting: Family Medicine

## 2018-01-05 VITALS — BP 142/80 | HR 72 | Temp 98.1°F | Resp 18 | Ht 69.0 in | Wt 188.0 lb

## 2018-01-05 DIAGNOSIS — I25810 Atherosclerosis of coronary artery bypass graft(s) without angina pectoris: Secondary | ICD-10-CM

## 2018-01-05 DIAGNOSIS — Z1211 Encounter for screening for malignant neoplasm of colon: Secondary | ICD-10-CM

## 2018-01-05 DIAGNOSIS — I255 Ischemic cardiomyopathy: Secondary | ICD-10-CM

## 2018-01-05 DIAGNOSIS — J439 Emphysema, unspecified: Secondary | ICD-10-CM | POA: Diagnosis not present

## 2018-01-05 LAB — COMPLETE METABOLIC PANEL WITH GFR
AG Ratio: 1.7 (calc) (ref 1.0–2.5)
ALKALINE PHOSPHATASE (APISO): 83 U/L (ref 40–115)
ALT: 13 U/L (ref 9–46)
AST: 13 U/L (ref 10–35)
Albumin: 3.7 g/dL (ref 3.6–5.1)
BILIRUBIN TOTAL: 0.4 mg/dL (ref 0.2–1.2)
BUN: 13 mg/dL (ref 7–25)
CHLORIDE: 109 mmol/L (ref 98–110)
CO2: 27 mmol/L (ref 20–32)
Calcium: 8.7 mg/dL (ref 8.6–10.3)
Creat: 1.08 mg/dL (ref 0.70–1.18)
GFR, EST AFRICAN AMERICAN: 78 mL/min/{1.73_m2} (ref >=60)
GFR, Est Non African American: 68 mL/min/{1.73_m2} (ref >=60)
Globulin: 2.2 g/dL (calc) (ref 1.9–3.7)
Glucose, Bld: 100 mg/dL — ABNORMAL HIGH (ref 65–99)
POTASSIUM: 4.4 mmol/L (ref 3.5–5.3)
Sodium: 145 mmol/L (ref 135–146)
TOTAL PROTEIN: 5.9 g/dL — AB (ref 6.1–8.1)

## 2018-01-05 LAB — CBC WITH DIFFERENTIAL/PLATELET
BASOS PCT: 1.3 %
Basophils Absolute: 69 cells/uL (ref 0–200)
EOS ABS: 111 {cells}/uL (ref 15–500)
Eosinophils Relative: 2.1 %
HCT: 37.5 % — ABNORMAL LOW (ref 38.5–50.0)
Hemoglobin: 12.3 g/dL — ABNORMAL LOW (ref 13.2–17.1)
Lymphs Abs: 933 cells/uL (ref 850–3900)
MCH: 29.6 pg (ref 27.0–33.0)
MCHC: 32.8 g/dL (ref 32.0–36.0)
MCV: 90.1 fL (ref 80.0–100.0)
MPV: 10.5 fL (ref 7.5–12.5)
Monocytes Relative: 9.4 %
Neutro Abs: 3689 cells/uL (ref 1500–7800)
Neutrophils Relative %: 69.6 %
PLATELETS: 202 10*3/uL (ref 140–400)
RBC: 4.16 10*6/uL — ABNORMAL LOW (ref 4.20–5.80)
RDW: 14 % (ref 11.0–15.0)
TOTAL LYMPHOCYTE: 17.6 %
WBC mixed population: 498 cells/uL (ref 200–950)
WBC: 5.3 10*3/uL (ref 3.8–10.8)

## 2018-01-05 LAB — LIPID PANEL
Cholesterol: 125 mg/dL (ref <200)
HDL: 37 mg/dL — AB (ref >40)
LDL CHOLESTEROL (CALC): 70 mg/dL
Non-HDL Cholesterol (Calc): 88 mg/dL (calc) (ref <130)
TRIGLYCERIDES: 95 mg/dL (ref <150)
Total CHOL/HDL Ratio: 3.4 (calc) (ref <5.0)

## 2018-01-05 MED ORDER — SACUBITRIL-VALSARTAN 49-51 MG PO TABS: 1.0000 | ORAL_TABLET | Freq: Two times a day (BID) | ORAL | 5 refills | Status: DC

## 2018-01-05 NOTE — Telephone Encounter (Signed)
Requesting refill    valium  LOV: 01/05/18  LRF:  11/07/17

## 2018-01-05 NOTE — Progress Notes (Signed)
Subjective:    Patient ID: Anthony Wolfe, male    DOB: February 29, 1946, 72 y.o.   MRN: [id number]  HPI  11/2017 Up until last week, the patient was doing well.  Starting last week he developed a queasy feeling in his stomach.  He reported early satiety, no appetite, nausea, he has chronic diarrhea.  He denies fever.  He denies melena.  He denies hematochezia.  He denies any vomiting.  Symptoms lasted 3 or 4 days.  2 days ago, he took Pepto-Bismol.  After that, his symptoms subsided.  He continues to have chronic diarrhea likely related to his cancer medication but otherwise the GI symptoms have completely gone away.  At that time, my plan was: Exam is reassuring.  Symptoms have completely subsided.  Patient was concerned the medication valsartan was causing his symptoms.  He saw an ad on television stating that this medication can cause cancer and he was worried that he may have stomach cancer.  I reassured the patient that I am certain that the valsartan was not causing any of his symptoms.  I also tried to assuage his fears regarding the linkage between valsartan, the contaminant NDEA, and cancer.  His manufacturer has not been recalled.  I recommended that he remain on the medication given his underlying congestive heart failure especially since he cannot afford entresto.  For the time being and we will clinically monitor the patient.  If symptoms return, we will institute a more thorough GI workup.  At the present time I suspect that this may have just been a mild stomach virus or possibly related to his bosulif.   01/05/18 Patient is here today for a general checkup.  He states that his breathing is much improved since he resumed Entresto 24/26 p.o. twice daily.  He denies any lightheadedness.  He denies any presyncope.  He is due to recheck renal function and potassium on the medication.  He continues to have some mild dyspnea on exertion however this is much improved on the [place] compared to without.   He is willing to pay the cost of the medication due to the benefit that he receives from it.  He denies any orthopnea.  He denies any paroxysmal nocturnal dyspnea.  He denies any arrhythmias or palpitations.  He denies any syncope.  He does have underlying COPD but he denies any cough, wheezing, or hemoptysis.  He too is due for a screening colonoscopy.  He is requesting that I schedule this for him. Past Medical History:  Diagnosis Date  . AICD (automatic cardioverter/defibrillator) present   . Allergic rhinitis   . 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 Anxiety   . Blood dyscrasia    cmleukemia  . C. difficile colitis 06/16/2014  . CAD (coronary artery disease)    a. s/p 3v CABG 2010. b. NSTEMI 05/2014 in setting of VT. Cath impressions: "Recent IMI with occluded SVG to RCA. Has Right to Right collaterals and collaterals from septal and OM. EF 40%."  . Carotid disease, bilateral ([place])    a. Right CEA 2002; known occluded left carotid. b. Duplex 06/2014: known occluded LICA, stable 0-93% RICA s/p CEA.  . Chronic systolic CHF (congestive heart failure) ([place])   . CML (chronic myelocytic leukemia) ([place]) 05/09/2012  . Depression   . Emphysema of lung ([place])   . HTN (hypertension)   . Hyperlipidemia   . Ischemic cardiomyopathy    a. Prior EF 36%. b. 2014: 50%. c. 05/2014: EF 35-40%  by echo, 40% with inf HK by cath.  . NSTEMI (non-ST elevation myocardial infarction) ([place]) 05/2014  . Pleural effusion 05/2014  . Pneumonia 05/23/2014  . Presence of permanent cardiac pacemaker   . Prostate hypertrophy   . PVC's (premature ventricular contractions)    a. PVC's with prior Holter showing PVC load of 22%.  . Shortness of breath dyspnea   . Stroke ([place]) 01  . TIA (transient ischemic attack)    ASPVD, S./P. right CEA, 2002, and known occluded left carotid  . Ventricular tachycardia ([place])    a. Admitted with VT 05/2013 - EPS with inducible sustained monomorphic VT; s/p Medtronic ICD 05/21/14.    Past Surgical History:  Procedure Laterality Date  . BACK SURGERY    . CAROTID ENDARTERECTOMY Right 01  . carotidectomy  2003   right side  . COLONOSCOPY  07/2010  . CORONARY ARTERY BYPASS GRAFT  02/2009   3 vessel  . ELECTROPHYSIOLOGIC STUDY  05/21/14  . ELECTROPHYSIOLOGY STUDY N/A 05/21/2014   Procedure: ELECTROPHYSIOLOGY STUDY;  Surgeon: Deboraha Sprang, MD;  Location: [facility] CATH LAB;  Service: Cardiovascular;  Laterality: N/A;  . EP IMPLANTABLE DEVICE  05/21/14   single chamber Metronic ICD  . LEFT HEART CATHETERIZATION WITH CORONARY/GRAFT ANGIOGRAM N/A 05/19/2014   Procedure: LEFT HEART CATHETERIZATION WITH Beatrix Fetters;  Surgeon: Josue Hector, MD;  Location: [facility] CATH LAB;  Service: Cardiovascular;  Laterality: N/A;  . PLEURAL BIOPSY N/A 11/27/2014   Procedure: PLEURAL BIOPSY;  Surgeon: [name], MD;  Location: [facility];  Service: Thoracic;  Laterality: N/A;  . PLEURAL EFFUSION DRAINAGE Right 11/27/2014   Procedure: DRAINAGE OF PLEURAL EFFUSION;  Surgeon: [name], MD;  Location: [place];  Service: Thoracic;  Laterality: Right;  . TALC PLEURODESIS Right 11/27/2014   Procedure: Pietro Cassis;  Surgeon: [name], MD;  Location: [place];  Service: Thoracic;  Laterality: Right;  Marland Kitchen VIDEO ASSISTED THORACOSCOPY Right 11/27/2014   Procedure: RIGHT VIDEO ASSISTED THORACOSCOPY;  Surgeon: [name], MD;  Location: [place];  Service: Thoracic;  Laterality: Right;   Current Outpatient Medications on File Prior to Visit  Medication Sig Dispense Refill  . atorvastatin (LIPITOR) 80 MG tablet TAKE ONE TABLET BY MOUTH ONCE DAILY AT  6PM. 90 tablet 4  . bosutinib (BOSULIF) 500 MG tablet Take 1 tablet (500 mg total) daily with breakfast by mouth. Take with food. 30 tablet 0  . carvedilol (COREG) 12.5 MG tablet Take 1 tablet (12.5 mg total) by mouth 2 (two) times daily with a meal. 180 tablet 3  . clopidogrel (PLAVIX) 75 MG tablet TAKE 1 TABLET BY MOUTH  DAILY 90 tablet 3  . diazepam (VALIUM) 10 MG tablet TAKE 1/2 TO 1 (ONE-HALF TO ONE) TABLET BY MOUTH EVERY 8 HOURS AS NEEDED FOR MUSCLE SPASM OR ANXIETY 30 tablet 1  . diphenoxylate-atropine (LOMOTIL) 2.5-0.025 MG tablet TAKE 2 TABLETS BY MOUTH FOUR TIMES A DAY AS NEEDED FOR DIARRHEA OR LOOSE STOOLS 30 tablet 3  . furosemide (LASIX) 20 MG tablet Take 20 mg by mouth daily as needed for fluid or edema.    Marland Kitchen guaiFENesin (MUCINEX) 600 MG 12 hr tablet Take 1200 mg by mouth 2-3 times a day as needed for cough or to loosen phlegm    . mexiletine (MEXITIL) 200 MG capsule Take 1 capsule (200 mg total) by mouth 2 (two) times daily. 60 capsule 11  . nitroGLYCERIN (NITROSTAT) 0.4 MG SL tablet DISSOLVE 1 TABLET UNDER THE TONGUE  EVERY 5 MINUTES AS NEEDED FOR CHEST PAIN 25 tablet 4  . ondansetron (ZOFRAN) 4 MG tablet Take 1 tablet (4 mg total) by mouth every 8 (eight) hours as needed for nausea or vomiting. 20 tablet 0  . sacubitril-valsartan (ENTRESTO) 24-26 MG Take 1 tablet by mouth 2 (two) times daily. 60 tablet 3  . sildenafil (VIAGRA) 100 MG tablet Take 0.5-1 tablets (50-100 mg total) by mouth daily as needed for erectile dysfunction. 5 tablet 11   No current facility-administered medications on file prior to visit.    Allergies  Allergen Reactions  . Norvasc [Amlodipine Besylate] Rash    rash  . Sulfa Antibiotics Hives    Hives    Social History   Socioeconomic History  . Marital status: Married    Spouse name: Not on file  . Number of children: 2  . Years of education: Not on file  . Highest education level: Not on file  Occupational History    Comment: retired Development worker, [employer]  Social Needs  . Financial resource strain: Not on file  . Food insecurity:    Worry: Not on file    Inability: Not on file  . Transportation needs:    Medical: Not on file    Non-medical: Not on file  Tobacco Use  . Smoking status: Former Smoker    Packs/day: 3.00    Years: 45.00    Pack years: 135.00    Types:  Cigarettes    Last attempt to quit: 08/08/1997    Years since quitting: 20.4  . Smokeless tobacco: Former Network engineer and Sexual Activity  . Alcohol use: No    Alcohol/week: 0.0 oz  . Drug use: No  . Sexual activity: Yes  Lifestyle  . Physical activity:    Days per week: Not on file    Minutes per session: Not on file  . Stress: Not on file  Relationships  . Social connections:    Talks on phone: Not on file    Gets together: Not on file    Attends religious service: Not on file    Active member of club or organization: Not on file    Attends meetings of clubs or organizations: Not on file    Relationship status: Not on file  . Intimate partner violence:    Fear of current or ex partner: Not on file    Emotionally abused: Not on file    Physically abused: Not on file    Forced sexual activity: Not on file  Other Topics Concern  . Not on file  Social History Narrative  . Not on file      Review of Systems  All other systems reviewed and are negative.      Objective:   Physical Exam  Constitutional: He appears well-developed and well-nourished.  Cardiovascular: Normal rate, regular rhythm and normal heart sounds.  No murmur heard. Pulmonary/Chest: Effort normal and breath sounds normal. No respiratory distress. He has no wheezes. He has no rales.  Abdominal: Soft. Bowel sounds are normal. He exhibits no distension and no mass. There is no tenderness. There is no rebound and no guarding.  Vitals reviewed.         Assessment & Plan:  Cardiomyopathy, ischemic - Plan: CBC with Differential/Platelet, COMPLETE METABOLIC PANEL WITH GFR, Lipid panel  Coronary artery disease involving coronary bypass graft of native heart without angina pectoris  Pulmonary emphysema, unspecified emphysema type ([place])  Colon cancer screening - Plan: Ambulatory referral to Gastroenterology  I would increase Entresto to 49/51 1 p.o. twice daily to optimize treatment for his  congestive heart failure/ischemic cardiomyopathy.  Otherwise he is on appropriate medication for his history of coronary artery disease.  He is on an antiplatelet agent, Plavix.  He is on a beta-blocker carvedilol that is not exacerbating his COPD.  He is on Entresto as dictated above.  He is on high-dose statin, 80 mg of Lipitor.  I will check a fasting lipid panel.  Goal LDL cholesterol is less than 70.  His COPD is stable.  This is the best the patient has felt in quite some time.  Therefore I will go ahead and schedule GI consultation to arrange colonoscopy to update his general health preventative screening

## 2018-01-09 ENCOUNTER — Encounter: Payer: Self-pay | Admitting: Nurse Practitioner

## 2018-01-16 ENCOUNTER — Ambulatory Visit (INDEPENDENT_AMBULATORY_CARE_PROVIDER_SITE_OTHER): Payer: Medicare HMO | Admitting: Family Medicine

## 2018-01-16 ENCOUNTER — Encounter: Payer: Self-pay | Admitting: Family Medicine

## 2018-01-16 ENCOUNTER — Ambulatory Visit (INDEPENDENT_AMBULATORY_CARE_PROVIDER_SITE_OTHER): Payer: Medicare HMO | Admitting: *Deleted

## 2018-01-16 ENCOUNTER — Telehealth: Payer: Self-pay

## 2018-01-16 VITALS — BP 80/42 | HR 62 | Temp 98.1°F | Resp 16 | Ht 69.0 in | Wt 185.0 lb

## 2018-01-16 DIAGNOSIS — E860 Dehydration: Secondary | ICD-10-CM

## 2018-01-16 DIAGNOSIS — R197 Diarrhea, unspecified: Secondary | ICD-10-CM | POA: Diagnosis not present

## 2018-01-16 DIAGNOSIS — I255 Ischemic cardiomyopathy: Secondary | ICD-10-CM

## 2018-01-16 DIAGNOSIS — I951 Orthostatic hypotension: Secondary | ICD-10-CM | POA: Diagnosis not present

## 2018-01-16 LAB — CBC WITH DIFFERENTIAL/PLATELET
BASOS PCT: 0.8 %
Basophils Absolute: 40 cells/uL (ref 0–200)
EOS ABS: 80 {cells}/uL (ref 15–500)
EOS PCT: 1.6 %
HCT: 35.7 % — ABNORMAL LOW (ref 38.5–50.0)
Hemoglobin: 11.8 g/dL — ABNORMAL LOW (ref 13.2–17.1)
Lymphs Abs: 960 cells/uL (ref 850–3900)
MCH: 29.6 pg (ref 27.0–33.0)
MCHC: 33.1 g/dL (ref 32.0–36.0)
MCV: 89.5 fL (ref 80.0–100.0)
MONOS PCT: 9.3 %
MPV: 10.8 fL (ref 7.5–12.5)
NEUTROS ABS: 3455 {cells}/uL (ref 1500–7800)
Neutrophils Relative %: 69.1 %
PLATELETS: 181 10*3/uL (ref 140–400)
RBC: 3.99 10*6/uL — ABNORMAL LOW (ref 4.20–5.80)
RDW: 14.2 % (ref 11.0–15.0)
TOTAL LYMPHOCYTE: 19.2 %
WBC mixed population: 465 cells/uL (ref 200–950)
WBC: 5 10*3/uL (ref 3.8–10.8)

## 2018-01-16 LAB — COMPLETE METABOLIC PANEL WITH GFR
AG Ratio: 1.9 (calc) (ref 1.0–2.5)
ALT: 11 U/L (ref 9–46)
AST: 11 U/L (ref 10–35)
Albumin: 3.9 g/dL (ref 3.6–5.1)
Alkaline phosphatase (APISO): 86 U/L (ref 40–115)
BUN: 14 mg/dL (ref 7–25)
CO2: 26 mmol/L (ref 20–32)
CREATININE: 1.15 mg/dL (ref 0.70–1.18)
Calcium: 8.4 mg/dL — ABNORMAL LOW (ref 8.6–10.3)
Chloride: 109 mmol/L (ref 98–110)
GFR, Est African American: 73 mL/min/{1.73_m2} (ref >=60)
GFR, Est Non African American: 63 mL/min/{1.73_m2} (ref >=60)
GLUCOSE: 79 mg/dL (ref 65–99)
Globulin: 2.1 g/dL (calc) (ref 1.9–3.7)
Potassium: 3.8 mmol/L (ref 3.5–5.3)
Sodium: 144 mmol/L (ref 135–146)
TOTAL PROTEIN: 6 g/dL — AB (ref 6.1–8.1)
Total Bilirubin: 0.5 mg/dL (ref 0.2–1.2)

## 2018-01-16 NOTE — Progress Notes (Signed)
Remote ICD transmission.   

## 2018-01-16 NOTE — Telephone Encounter (Signed)
Spoke with pt and reminded pt of remote transmission that is due today. Pt verbalized understanding.   

## 2018-01-16 NOTE — Progress Notes (Signed)
Subjective:    Patient ID: Anthony Wolfe, male    DOB: [birth year], 72 y.o.   MRN: [id number]  HPI  11/2017 Up until last week, the patient was doing well.  Starting last week he developed a queasy feeling in his stomach.  He reported early satiety, no appetite, nausea, he has chronic diarrhea.  He denies fever.  He denies melena.  He denies hematochezia.  He denies any vomiting.  Symptoms lasted 3 or 4 days.  2 days ago, he took Pepto-Bismol.  After that, his symptoms subsided.  He continues to have chronic diarrhea likely related to his cancer medication but otherwise the GI symptoms have completely gone away.  At that time, my plan was: Exam is reassuring.  Symptoms have completely subsided.  Patient was concerned the medication valsartan was causing his symptoms.  He saw an ad on television stating that this medication can cause cancer and he was worried that he may have stomach cancer.  I reassured the patient that I am certain that the valsartan was not causing any of his symptoms.  I also tried to assuage his fears regarding the linkage between valsartan, the contaminant NDEA, and cancer.  His manufacturer has not been recalled.  I recommended that he remain on the medication given his underlying congestive heart failure especially since he cannot afford entresto.  For the time being and we will clinically monitor the patient.  If symptoms return, we will institute a more thorough GI workup.  At the present time I suspect that this may have just been a mild stomach virus or possibly related to his bosulif.   01/05/18 Patient is here today for a general checkup.  He states that his breathing is much improved since he resumed Entresto 24/26 p.o. twice daily.  He denies any lightheadedness.  He denies any presyncope.  He is due to recheck renal function and potassium on the medication.  He continues to have some mild dyspnea on exertion however this is much improved on the [place] compared to without.   He is willing to pay the cost of the medication due to the benefit that he receives from it.  He denies any orthopnea.  He denies any paroxysmal nocturnal dyspnea.  He denies any arrhythmias or palpitations.  He denies any syncope.  He does have underlying COPD but he denies any cough, wheezing, or hemoptysis.  He too is due for a screening colonoscopy.  He is requesting that I schedule this for him.  At that time, my plan was: I would increase Entresto to 49/51 1 p.o. twice daily to optimize treatment for his congestive heart failure/ischemic cardiomyopathy.  Otherwise he is on appropriate medication for his history of coronary artery disease.  He is on an antiplatelet agent, Plavix.  He is on a beta-blocker carvedilol that is not exacerbating his COPD.  He is on Entresto as dictated above.  He is on high-dose statin, 80 mg of Lipitor.  I will check a fasting lipid panel.  Goal LDL cholesterol is less than 70.  His COPD is stable.  This is the best the patient has felt in quite some time.  Therefore I will go ahead and schedule GI consultation to arrange colonoscopy to update his general health preventative screening  01/16/18 Patient presents today complaining of feeling weak and lightheaded.  He feels extremely tired and dizzy.  Recently he has been working in his garden and whenever he he stands up after bending over, he  will feel lightheaded like he may pass out.  His blood pressure is extremely low at 80/42 today.  He is only noticed the symptoms over the last 3 to 4 days.  During that time, he is not eating as well.  Furthermore he has had increased diarrhea recently.  He has chronic diarrhea which has been attributed to his cancer medication in the past.  However the patient has independently discontinued the cancer medication in April and the diarrhea has not improved.  His wife and the patient are both concerned that he may have a recurrent bout of C. difficile colitis as he is experienced this in the  past.  He denies any chest pain shortness of breath or dyspnea on exertion. Past Medical History:  Diagnosis Date  . AICD (automatic cardioverter/defibrillator) present   . Allergic rhinitis   . 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 Anxiety   . Blood dyscrasia    cmleukemia  . C. difficile colitis 06/16/2014  . CAD (coronary artery disease)    a. s/p 3v CABG 2010. b. NSTEMI 05/2014 in setting of VT. Cath impressions: "Recent IMI with occluded SVG to RCA. Has Right to Right collaterals and collaterals from septal and OM. EF 40%."  . Carotid disease, bilateral ([place])    a. Right CEA 2002; known occluded left carotid. b. Duplex 06/2014: known occluded LICA, stable 9-37% RICA s/p CEA.  . Chronic systolic CHF (congestive heart failure) ([place])   . 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 Depression   . Emphysema of lung ([place])   . HTN (hypertension)   . Hyperlipidemia   . Ischemic cardiomyopathy    a. Prior EF 36%. b. 2014: 50%. c. 05/2014: EF 35-40% by echo, 40% with inf HK by cath.  . NSTEMI (non-ST elevation myocardial infarction) ([place]) 05/2014  . Pleural effusion 05/2014  . Pneumonia 05/23/2014  . Presence of permanent cardiac pacemaker   . Prostate hypertrophy   . PVC's (premature ventricular contractions)    a. PVC's with prior Holter showing PVC load of 22%.  . Shortness of breath dyspnea   . Stroke ([place]) 01  . TIA (transient ischemic attack)    ASPVD, S./P. right CEA, 2002, and known occluded left carotid  . Ventricular tachycardia ([place])    a. Admitted with VT 05/2013 - EPS with inducible sustained monomorphic VT; s/p [employer] ICD 05/21/14.   Past Surgical History:  Procedure Laterality Date  . BACK SURGERY    . CAROTID ENDARTERECTOMY Right 01  . carotidectomy  2003   right side  . COLONOSCOPY  07/2010  . CORONARY ARTERY BYPASS GRAFT  02/2009   3 vessel  . ELECTROPHYSIOLOGIC STUDY  05/21/14  . ELECTROPHYSIOLOGY STUDY N/A 05/21/2014   Procedure:  ELECTROPHYSIOLOGY STUDY;  Surgeon: Deboraha Sprang, MD;  Location: [facility] CATH LAB;  Service: Cardiovascular;  Laterality: N/A;  . EP IMPLANTABLE DEVICE  05/21/14   single chamber Metronic ICD  . LEFT HEART CATHETERIZATION WITH CORONARY/GRAFT ANGIOGRAM N/A 05/19/2014   Procedure: LEFT HEART CATHETERIZATION WITH Beatrix Fetters;  Surgeon: Josue Hector, MD;  Location: [facility] CATH LAB;  Service: Cardiovascular;  Laterality: N/A;  . PLEURAL BIOPSY N/A 11/27/2014   Procedure: PLEURAL BIOPSY;  Surgeon: [name], MD;  Location: [place];  Service: Thoracic;  Laterality: N/A;  . PLEURAL EFFUSION DRAINAGE Right 11/27/2014   Procedure: DRAINAGE OF PLEURAL EFFUSION;  Surgeon: [name], MD;  Location: [place];  Service: Thoracic;  Laterality: Right;  . TALC  PLEURODESIS Right 11/27/2014   Procedure: Pietro Cassis;  Surgeon: [name], MD;  Location: [place];  Service: Thoracic;  Laterality: Right;  Marland Kitchen VIDEO ASSISTED THORACOSCOPY Right 11/27/2014   Procedure: RIGHT VIDEO ASSISTED THORACOSCOPY;  Surgeon: [name], MD;  Location: [place];  Service: Thoracic;  Laterality: Right;   Current Outpatient Medications on File Prior to Visit  Medication Sig Dispense Refill  . atorvastatin (LIPITOR) 80 MG tablet TAKE ONE TABLET BY MOUTH ONCE DAILY AT  6PM. 90 tablet 4  . carvedilol (COREG) 12.5 MG tablet Take 1 tablet (12.5 mg total) by mouth 2 (two) times daily with a meal. 180 tablet 3  . clopidogrel (PLAVIX) 75 MG tablet TAKE 1 TABLET BY MOUTH DAILY 90 tablet 3  . diazepam (VALIUM) 10 MG tablet TAKE 1/2 TO 1 (ONE-HALF TO ONE) TABLET BY MOUTH EVERY 8 HOURS AS NEEDED FOR MUSCLE SPASM OR ANXIETY 30 tablet 1  . diphenoxylate-atropine (LOMOTIL) 2.5-0.025 MG tablet TAKE 2 TABLETS BY MOUTH FOUR TIMES A DAY AS NEEDED FOR DIARRHEA OR LOOSE STOOLS 30 tablet 3  . furosemide (LASIX) 20 MG tablet Take 20 mg by mouth daily as needed for fluid or edema.    Marland Kitchen guaiFENesin (MUCINEX) 600 MG 12 hr  tablet Take 1200 mg by mouth 2-3 times a day as needed for cough or to loosen phlegm    . mexiletine (MEXITIL) 200 MG capsule Take 1 capsule (200 mg total) by mouth 2 (two) times daily. 60 capsule 11  . nitroGLYCERIN (NITROSTAT) 0.4 MG SL tablet DISSOLVE 1 TABLET UNDER THE TONGUE EVERY 5 MINUTES AS NEEDED FOR CHEST PAIN 25 tablet 4  . ondansetron (ZOFRAN) 4 MG tablet Take 1 tablet (4 mg total) by mouth every 8 (eight) hours as needed for nausea or vomiting. 20 tablet 0  . sacubitril-valsartan (ENTRESTO) 49-51 MG Take 1 tablet by mouth 2 (two) times daily. 60 tablet 5  . sildenafil (VIAGRA) 100 MG tablet Take 0.5-1 tablets (50-100 mg total) by mouth daily as needed for erectile dysfunction. 5 tablet 11  . bosutinib (BOSULIF) 500 MG tablet Take 1 tablet (500 mg total) daily with breakfast by mouth. Take with food. (Patient not taking: Reported on 01/16/2018) 30 tablet 0   No current facility-administered medications on file prior to visit.    Allergies  Allergen Reactions  . Norvasc [Amlodipine Besylate] Rash    rash  . Sulfa Antibiotics Hives    Hives    Social History   Socioeconomic History  . Marital status: Married    Spouse name: Not on file  . Number of children: 2  . Years of education: Not on file  . Highest education level: Not on file  Occupational History    Comment: retired Development worker, [employer]  Social Needs  . Financial resource strain: Not on file  . Food insecurity:    Worry: Not on file    Inability: Not on file  . Transportation needs:    Medical: Not on file    Non-medical: Not on file  Tobacco Use  . Smoking status: Former Smoker    Packs/day: 3.00    Years: 45.00    Pack years: 135.00    Types: Cigarettes    Last attempt to quit: 08/08/1997    Years since quitting: 20.4  . Smokeless tobacco: Former Network engineer and Sexual Activity  . Alcohol use: No    Alcohol/week: 0.0 oz  . Drug use: No  . Sexual activity: Yes  Lifestyle  .  Physical activity:    Days per  week: Not on file    Minutes per session: Not on file  . Stress: Not on file  Relationships  . Social connections:    Talks on phone: Not on file    Gets together: Not on file    Attends religious service: Not on file    Active member of club or organization: Not on file    Attends meetings of clubs or organizations: Not on file    Relationship status: Not on file  . Intimate partner violence:    Fear of current or ex partner: Not on file    Emotionally abused: Not on file    Physically abused: Not on file    Forced sexual activity: Not on file  Other Topics Concern  . Not on file  Social History Narrative  . Not on file      Review of Systems  Gastrointestinal: Positive for diarrhea.  All other systems reviewed and are negative.      Objective:   Physical Exam  Constitutional: He appears well-developed and well-nourished.  Cardiovascular: Normal rate, regular rhythm and normal heart sounds.  No murmur heard. Pulmonary/Chest: Effort normal and breath sounds normal. No respiratory distress. He has no wheezes. He has no rales.  Abdominal: Soft. Bowel sounds are normal. He exhibits no distension and no mass. There is no tenderness. There is no rebound and no guarding.  Vitals reviewed.         Assessment & Plan:  Orthostatic hypotension - Plan: CBC with Differential/Platelet, COMPLETE METABOLIC PANEL WITH GFR  Cardiomyopathy, ischemic  Diarrhea, unspecified type - Plan: Gastrointestinal Pathogen Panel PCR  I believe the patient is experiencing severe orthostatic hypotension due to multiple factors including dehydration possibly from diarrhea, overmedication due to the recent increased dose of Entresto, and decreased oral intake over the last 3 to 4 days.  Therefore I will give the patient IV fluids slowly given his congestive heart failure.  He will be given 500 cc of normal saline slowly today in clinic.  I will check a CBC to rule out anemia.  I will check a CMP to  rule out kidney failure or severe dehydration.  I will check a GI pathogen panel stool test to rule out infectious causes of diarrhea.  I will increase the patient's dose of Lomotil to 2 tablets 4 times a day to try to stop the diarrhea to calm the dehydration and I will temporarily discontinue his Delene Loll and then see the patient back in clinic on Thursday to see if he is feeling better.  If symptoms worsen he will need to go to the hospital

## 2018-01-17 ENCOUNTER — Encounter: Payer: Self-pay | Admitting: Cardiology

## 2018-01-17 ENCOUNTER — Other Ambulatory Visit: Payer: Medicare HMO

## 2018-01-17 DIAGNOSIS — K529 Noninfective gastroenteritis and colitis, unspecified: Secondary | ICD-10-CM | POA: Diagnosis not present

## 2018-01-17 DIAGNOSIS — R195 Other fecal abnormalities: Secondary | ICD-10-CM | POA: Diagnosis not present

## 2018-01-17 DIAGNOSIS — R197 Diarrhea, unspecified: Secondary | ICD-10-CM | POA: Diagnosis not present

## 2018-01-17 DIAGNOSIS — R1013 Epigastric pain: Secondary | ICD-10-CM | POA: Diagnosis not present

## 2018-01-18 ENCOUNTER — Encounter: Payer: Self-pay | Admitting: Family Medicine

## 2018-01-18 ENCOUNTER — Ambulatory Visit (INDEPENDENT_AMBULATORY_CARE_PROVIDER_SITE_OTHER): Payer: Medicare HMO | Admitting: Family Medicine

## 2018-01-18 VITALS — BP 130/70 | HR 66 | Temp 98.0°F | Resp 14 | Ht 69.0 in | Wt 188.0 lb

## 2018-01-18 DIAGNOSIS — M722 Plantar fascial fibromatosis: Secondary | ICD-10-CM | POA: Diagnosis not present

## 2018-01-18 DIAGNOSIS — I255 Ischemic cardiomyopathy: Secondary | ICD-10-CM

## 2018-01-18 DIAGNOSIS — I951 Orthostatic hypotension: Secondary | ICD-10-CM

## 2018-01-18 LAB — GASTROINTESTINAL PATHOGEN PANEL PCR
C. DIFFICILE TOX A/B, PCR: NOT DETECTED
Campylobacter, PCR: NOT DETECTED
Cryptosporidium, PCR: NOT DETECTED
E COLI (ETEC) LT/ST, PCR: NOT DETECTED
E COLI (STEC) STX1/STX2, PCR: NOT DETECTED
E COLI 0157, PCR: NOT DETECTED
Giardia lamblia, PCR: NOT DETECTED
Norovirus, PCR: NOT DETECTED
Rotavirus A, PCR: NOT DETECTED
SALMONELLA, PCR: NOT DETECTED
Shigella, PCR: NOT DETECTED

## 2018-01-18 NOTE — Progress Notes (Signed)
Subjective:    Patient ID: Anthony Wolfe, male    DOB: October 05, 1945, 72 y.o.   MRN: [id number]  Diarrhea      11/2017 Up until last week, the patient was doing well.  Starting last week he developed a queasy feeling in his stomach.  He reported early satiety, no appetite, nausea, he has chronic diarrhea.  He denies fever.  He denies melena.  He denies hematochezia.  He denies any vomiting.  Symptoms lasted 3 or 4 days.  2 days ago, he took Pepto-Bismol.  After that, his symptoms subsided.  He continues to have chronic diarrhea likely related to his cancer medication but otherwise the GI symptoms have completely gone away.  At that time, my plan was: Exam is reassuring.  Symptoms have completely subsided.  Patient was concerned the medication valsartan was causing his symptoms.  He saw an ad on television stating that this medication can cause cancer and he was worried that he may have stomach cancer.  I reassured the patient that I am certain that the valsartan was not causing any of his symptoms.  I also tried to assuage his fears regarding the linkage between valsartan, the contaminant NDEA, and cancer.  His manufacturer has not been recalled.  I recommended that he remain on the medication given his underlying congestive heart failure especially since he cannot afford entresto.  For the time being and we will clinically monitor the patient.  If symptoms return, we will institute a more thorough GI workup.  At the present time I suspect that this may have just been a mild stomach virus or possibly related to his bosulif.   01/05/18 Patient is here today for a general checkup.  He states that his breathing is much improved since he resumed Entresto 24/26 p.o. twice daily.  He denies any lightheadedness.  He denies any presyncope.  He is due to recheck renal function and potassium on the medication.  He continues to have some mild dyspnea on exertion however this is much improved on the [place] compared  to without.  He is willing to pay the cost of the medication due to the benefit that he receives from it.  He denies any orthopnea.  He denies any paroxysmal nocturnal dyspnea.  He denies any arrhythmias or palpitations.  He denies any syncope.  He does have underlying COPD but he denies any cough, wheezing, or hemoptysis.  He too is due for a screening colonoscopy.  He is requesting that I schedule this for him.  At that time, my plan was: I would increase Entresto to 49/51 1 p.o. twice daily to optimize treatment for his congestive heart failure/ischemic cardiomyopathy.  Otherwise he is on appropriate medication for his history of coronary artery disease.  He is on an antiplatelet agent, Plavix.  He is on a beta-blocker carvedilol that is not exacerbating his COPD.  He is on Entresto as dictated above.  He is on high-dose statin, 80 mg of Lipitor.  I will check a fasting lipid panel.  Goal LDL cholesterol is less than 70.  His COPD is stable.  This is the best the patient has felt in quite some time.  Therefore I will go ahead and schedule GI consultation to arrange colonoscopy to update his general health preventative screening  01/16/18 Patient presents today complaining of feeling weak and lightheaded.  He feels extremely tired and dizzy.  Recently he has been working in his garden and whenever he he stands up  after bending over, he will feel lightheaded like he may pass out.  His blood pressure is extremely low at 80/42 today.  He is only noticed the symptoms over the last 3 to 4 days.  During that time, he is not eating as well.  Furthermore he has had increased diarrhea recently.  He has chronic diarrhea which has been attributed to his cancer medication in the past.  However the patient has independently discontinued the cancer medication in April and the diarrhea has not improved.  His wife and the patient are both concerned that he may have a recurrent bout of C. difficile colitis as he is experienced  this in the past.  He denies any chest pain shortness of breath or dyspnea on exertion.  At that time, my plan was: I believe the patient is experiencing severe orthostatic hypotension due to multiple factors including dehydration possibly from diarrhea, overmedication due to the recent increased dose of Entresto, and decreased oral intake over the last 3 to 4 days.  Therefore I will give the patient IV fluids slowly given his congestive heart failure.  He will be given 500 cc of normal saline slowly today in clinic.  I will check a CBC to rule out anemia.  I will check a CMP to rule out kidney failure or severe dehydration.  I will check a GI pathogen panel stool test to rule out infectious causes of diarrhea.  I will increase the patient's dose of Lomotil to 2 tablets 4 times a day to try to stop the diarrhea to calm the dehydration and I will temporarily discontinue his Delene Loll and then see the patient back in clinic on Thursday to see if he is feeling better.  If symptoms worsen he will need to go to the hospital  01/18/18 Patient feels much better.  His blood pressure is back to normal range today.  He denies any orthostatic dizziness.  He denies any near syncope.  His diarrhea has also improved as his wife is liberalized her use of Lomotil.  Overall he feels much better and back to normal.  He is not taking his Entresto.  He is complaining of pain in his left heel that is been there for approximately 2 months.  Pain is located at the insertion of the plantar fascia on the left calcaneus.  There is a palpable bone spur in that area as well.  It hurts with ambulation in the morning.  Any pressure on his heel causes pain.  He denies any falls or injury. Past Medical History:  Diagnosis Date  . AICD (automatic cardioverter/defibrillator) present   . Allergic rhinitis   . 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 Anxiety   . Blood dyscrasia    cmleukemia  . C. difficile colitis 06/16/2014  . CAD  (coronary artery disease)    a. s/p 3v CABG 2010. b. NSTEMI 05/2014 in setting of VT. Cath impressions: "Recent IMI with occluded SVG to RCA. Has Right to Right collaterals and collaterals from septal and OM. EF 40%."  . Carotid disease, bilateral ([place])    a. Right CEA 2002; known occluded left carotid. b. Duplex 06/2014: known occluded LICA, stable 1-51% RICA s/p CEA.  . Chronic systolic CHF (congestive heart failure) ([place])   . 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 Depression   . Emphysema of lung ([place])   . HTN (hypertension)   . Hyperlipidemia   . Ischemic cardiomyopathy    a. Prior EF 36%. b.  2014: 50%. c. 05/2014: EF 35-40% by echo, 40% with inf HK by cath.  . NSTEMI (non-ST elevation myocardial infarction) ([place]) 05/2014  . Pleural effusion 05/2014  . Pneumonia 05/23/2014  . Presence of permanent cardiac pacemaker   . Prostate hypertrophy   . PVC's (premature ventricular contractions)    a. PVC's with prior Holter showing PVC load of 22%.  . Shortness of breath dyspnea   . Stroke ([place]) 01  . TIA (transient ischemic attack)    ASPVD, S./P. right CEA, 2002, and known occluded left carotid  . Ventricular tachycardia ([place])    a. Admitted with VT 05/2013 - EPS with inducible sustained monomorphic VT; s/p Medtronic ICD 05/21/14.   Past Surgical History:  Procedure Laterality Date  . BACK SURGERY    . CAROTID ENDARTERECTOMY Right 01  . carotidectomy  2003   right side  . COLONOSCOPY  07/2010  . CORONARY ARTERY BYPASS GRAFT  02/2009   3 vessel  . ELECTROPHYSIOLOGIC STUDY  05/21/14  . ELECTROPHYSIOLOGY STUDY N/A 05/21/2014   Procedure: ELECTROPHYSIOLOGY STUDY;  Surgeon: Deboraha Sprang, MD;  Location: [facility] CATH LAB;  Service: Cardiovascular;  Laterality: N/A;  . EP IMPLANTABLE DEVICE  05/21/14   single chamber Metronic ICD  . LEFT HEART CATHETERIZATION WITH CORONARY/GRAFT ANGIOGRAM N/A 05/19/2014   Procedure: LEFT HEART CATHETERIZATION WITH Beatrix Fetters;   Surgeon: Josue Hector, MD;  Location: [facility] CATH LAB;  Service: Cardiovascular;  Laterality: N/A;  . PLEURAL BIOPSY N/A 11/27/2014   Procedure: PLEURAL BIOPSY;  Surgeon: [name], MD;  Location: [place];  Service: Thoracic;  Laterality: N/A;  . PLEURAL EFFUSION DRAINAGE Right 11/27/2014   Procedure: DRAINAGE OF PLEURAL EFFUSION;  Surgeon: [name], MD;  Location: [place];  Service: Thoracic;  Laterality: Right;  . TALC PLEURODESIS Right 11/27/2014   Procedure: Pietro Cassis;  Surgeon: [name], MD;  Location: [place];  Service: Thoracic;  Laterality: Right;  Marland Kitchen VIDEO ASSISTED THORACOSCOPY Right 11/27/2014   Procedure: RIGHT VIDEO ASSISTED THORACOSCOPY;  Surgeon: [name], MD;  Location: [place];  Service: Thoracic;  Laterality: Right;   Current Outpatient Medications on File Prior to Visit  Medication Sig Dispense Refill  . atorvastatin (LIPITOR) 80 MG tablet TAKE ONE TABLET BY MOUTH ONCE DAILY AT  6PM. 90 tablet 4  . bosutinib (BOSULIF) 500 MG tablet Take 1 tablet (500 mg total) daily with breakfast by mouth. Take with food. (Patient not taking: Reported on 01/16/2018) 30 tablet 0  . carvedilol (COREG) 12.5 MG tablet Take 1 tablet (12.5 mg total) by mouth 2 (two) times daily with a meal. 180 tablet 3  . clopidogrel (PLAVIX) 75 MG tablet TAKE 1 TABLET BY MOUTH DAILY 90 tablet 3  . diazepam (VALIUM) 10 MG tablet TAKE 1/2 TO 1 (ONE-HALF TO ONE) TABLET BY MOUTH EVERY 8 HOURS AS NEEDED FOR MUSCLE SPASM OR ANXIETY 30 tablet 1  . diphenoxylate-atropine (LOMOTIL) 2.5-0.025 MG tablet TAKE 2 TABLETS BY MOUTH FOUR TIMES A DAY AS NEEDED FOR DIARRHEA OR LOOSE STOOLS 30 tablet 3  . furosemide (LASIX) 20 MG tablet Take 20 mg by mouth daily as needed for fluid or edema.    Marland Kitchen guaiFENesin (MUCINEX) 600 MG 12 hr tablet Take 1200 mg by mouth 2-3 times a day as needed for cough or to loosen phlegm    . mexiletine (MEXITIL) 200 MG capsule Take 1 capsule (200 mg total) by mouth 2  (two) times daily. 60 capsule 11  .  nitroGLYCERIN (NITROSTAT) 0.4 MG SL tablet DISSOLVE 1 TABLET UNDER THE TONGUE EVERY 5 MINUTES AS NEEDED FOR CHEST PAIN 25 tablet 4  . ondansetron (ZOFRAN) 4 MG tablet Take 1 tablet (4 mg total) by mouth every 8 (eight) hours as needed for nausea or vomiting. 20 tablet 0  . sacubitril-valsartan (ENTRESTO) 49-51 MG Take 1 tablet by mouth 2 (two) times daily. 60 tablet 5  . sildenafil (VIAGRA) 100 MG tablet Take 0.5-1 tablets (50-100 mg total) by mouth daily as needed for erectile dysfunction. 5 tablet 11   No current facility-administered medications on file prior to visit.    Allergies  Allergen Reactions  . Norvasc [Amlodipine Besylate] Rash    rash  . Sulfa Antibiotics Hives    Hives    Social History   Socioeconomic History  . Marital status: Married    Spouse name: Not on file  . Number of children: 2  . Years of education: Not on file  . Highest education level: Not on file  Occupational History    Comment: retired Development worker, international aid  Social Needs  . Financial resource strain: Not on file  . Food insecurity:    Worry: Not on file    Inability: Not on file  . Transportation needs:    Medical: Not on file    Non-medical: Not on file  Tobacco Use  . Smoking status: Former Smoker    Packs/day: 3.00    Years: 45.00    Pack years: 135.00    Types: Cigarettes    Last attempt to quit: 08/08/1997    Years since quitting: 20.4  . Smokeless tobacco: Former Network engineer and Sexual Activity  . Alcohol use: No    Alcohol/week: 0.0 oz  . Drug use: No  . Sexual activity: Yes  Lifestyle  . Physical activity:    Days per week: Not on file    Minutes per session: Not on file  . Stress: Not on file  Relationships  . Social connections:    Talks on phone: Not on file    Gets together: Not on file    Attends religious service: Not on file    Active member of club or organization: Not on file    Attends meetings of clubs or organizations: Not on  file    Relationship status: Not on file  . Intimate partner violence:    Fear of current or ex partner: Not on file    Emotionally abused: Not on file    Physically abused: Not on file    Forced sexual activity: Not on file  Other Topics Concern  . Not on file  Social History Narrative  . Not on file      Review of Systems  Gastrointestinal: Positive for diarrhea.  All other systems reviewed and are negative.      Objective:   Physical Exam  Constitutional: He appears well-developed and well-nourished.  Cardiovascular: Normal rate, regular rhythm and normal heart sounds.  No murmur heard. Pulmonary/Chest: Effort normal and breath sounds normal. No respiratory distress. He has no wheezes. He has no rales.  Abdominal: Soft. Bowel sounds are normal. He exhibits no distension and no mass. There is no tenderness. There is no rebound and no guarding.  Musculoskeletal:       Left foot: There is tenderness and bony tenderness.       Feet:  Vitals reviewed.         Assessment & Plan:  Hypotension secondary to medication, plantar  fasciitis of the left heel  Resume Entresto albeit at a reduced dose to 24/26 1 p.o. twice daily.  Monitor blood pressure closely.  Patient elects to receive a cortisone injection in his left heel.  Using sterile technique, I injected the point of maximum tenderness as diagram with a red circle on his physical exam with a mixture of 1 cc of lidocaine, and 1 cc of 40 mg/mL Kenalog.  Patient tolerated the procedure well without complication

## 2018-01-19 LAB — CUP PACEART REMOTE DEVICE CHECK
Battery Remaining Longevity: 100 mo
Battery Voltage: 2.93 V
Brady Statistic RV Percent Paced: 0.07 %
HighPow Impedance: 55 Ohm
Implantable Lead Location: 753860
Lead Channel Impedance Value: 361 Ohm
Lead Channel Impedance Value: 418 Ohm
Lead Channel Sensing Intrinsic Amplitude: 9.125 mV
Lead Channel Setting Pacing Amplitude: 2 V
Lead Channel Setting Sensing Sensitivity: 0.3 mV
MDC IDC LEAD IMPLANT DT: 20151014
MDC IDC MSMT LEADCHNL RV PACING THRESHOLD AMPLITUDE: 1 V
MDC IDC MSMT LEADCHNL RV PACING THRESHOLD PULSEWIDTH: 0.4 ms
MDC IDC MSMT LEADCHNL RV SENSING INTR AMPL: 9.125 mV
MDC IDC PG IMPLANT DT: 20151014
MDC IDC SESS DTM: 20190611145643
MDC IDC SET LEADCHNL RV PACING PULSEWIDTH: 0.4 ms

## 2018-01-22 ENCOUNTER — Encounter: Payer: Self-pay | Admitting: Family Medicine

## 2018-01-22 ENCOUNTER — Ambulatory Visit (INDEPENDENT_AMBULATORY_CARE_PROVIDER_SITE_OTHER): Payer: Medicare HMO | Admitting: Family Medicine

## 2018-01-22 VITALS — BP 156/82 | HR 62 | Temp 98.1°F | Resp 16 | Ht 69.0 in | Wt 186.0 lb

## 2018-01-22 DIAGNOSIS — M109 Gout, unspecified: Secondary | ICD-10-CM | POA: Diagnosis not present

## 2018-01-22 MED ORDER — PREDNISONE 20 MG PO TABS: ORAL_TABLET | ORAL | 0 refills | Status: DC

## 2018-01-22 NOTE — Progress Notes (Signed)
Subjective:    Patient ID: Anthony Wolfe, male    DOB: July 23, 1946, 72 y.o.   MRN: [id number]  Patient report pain in the right first MTP joint.  Pain is been present for approximately 1 week.  It hurts to flex and extend the joint.  He denies any injuries or falls.  The joint is slightly swollen.  It is not erythematous.  But it is warm to the touch. Past Medical History:  Diagnosis Date  . AICD (automatic cardioverter/defibrillator) present   . Allergic rhinitis   . 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 Anxiety   . Blood dyscrasia    cmleukemia  . C. difficile colitis 06/16/2014  . CAD (coronary artery disease)    a. s/p 3v CABG 2010. b. NSTEMI 05/2014 in setting of VT. Cath impressions: "Recent IMI with occluded SVG to RCA. Has Right to Right collaterals and collaterals from septal and OM. EF 40%."  . Carotid disease, bilateral ([place])    a. Right CEA 2002; known occluded left carotid. b. Duplex 06/2014: known occluded LICA, stable 4-33% RICA s/p CEA.  . Chronic systolic CHF (congestive heart failure) ([place])   . 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 Depression   . Emphysema of lung ([place])   . HTN (hypertension)   . Hyperlipidemia   . Ischemic cardiomyopathy    a. Prior EF 36%. b. 2014: 50%. c. 05/2014: EF 35-40% by echo, 40% with inf HK by cath.  . NSTEMI (non-ST elevation myocardial infarction) ([place]) 05/2014  . Pleural effusion 05/2014  . Pneumonia 05/23/2014  . Presence of permanent cardiac pacemaker   . Prostate hypertrophy   . PVC's (premature ventricular contractions)    a. PVC's with prior Holter showing PVC load of 22%.  . Shortness of breath dyspnea   . Stroke ([place]) 01  . TIA (transient ischemic attack)    ASPVD, S./P. right CEA, 2002, and known occluded left carotid  . Ventricular tachycardia ([place])    a. Admitted with VT 05/2013 - EPS with inducible sustained monomorphic VT; s/p Medtronic ICD 05/21/14.   Past Surgical History:  Procedure Laterality  Date  . BACK SURGERY    . CAROTID ENDARTERECTOMY Right 01  . carotidectomy  2003   right side  . COLONOSCOPY  07/2010  . CORONARY ARTERY BYPASS GRAFT  02/2009   3 vessel  . ELECTROPHYSIOLOGIC STUDY  05/21/14  . ELECTROPHYSIOLOGY STUDY N/A 05/21/2014   Procedure: ELECTROPHYSIOLOGY STUDY;  Surgeon: Deboraha Sprang, MD;  Location: [facility] CATH LAB;  Service: Cardiovascular;  Laterality: N/A;  . EP IMPLANTABLE DEVICE  05/21/14   single chamber Metronic ICD  . LEFT HEART CATHETERIZATION WITH CORONARY/GRAFT ANGIOGRAM N/A 05/19/2014   Procedure: LEFT HEART CATHETERIZATION WITH Beatrix Fetters;  Surgeon: Josue Hector, MD;  Location: [facility] CATH LAB;  Service: Cardiovascular;  Laterality: N/A;  . PLEURAL BIOPSY N/A 11/27/2014   Procedure: PLEURAL BIOPSY;  Surgeon: [name], MD;  Location: [place];  Service: Thoracic;  Laterality: N/A;  . PLEURAL EFFUSION DRAINAGE Right 11/27/2014   Procedure: DRAINAGE OF PLEURAL EFFUSION;  Surgeon: [name], MD;  Location: [place];  Service: Thoracic;  Laterality: Right;  . TALC PLEURODESIS Right 11/27/2014   Procedure: Pietro Cassis;  Surgeon: [name], MD;  Location: [place];  Service: Thoracic;  Laterality: Right;  Marland Kitchen VIDEO ASSISTED THORACOSCOPY Right 11/27/2014   Procedure: RIGHT VIDEO ASSISTED THORACOSCOPY;  Surgeon: [name], MD;  Location: [place];  Service:  Thoracic;  Laterality: Right;   Current Outpatient Medications on File Prior to Visit  Medication Sig Dispense Refill  . atorvastatin (LIPITOR) 80 MG tablet TAKE ONE TABLET BY MOUTH ONCE DAILY AT  6PM. 90 tablet 4  . carvedilol (COREG) 12.5 MG tablet Take 1 tablet (12.5 mg total) by mouth 2 (two) times daily with a meal. 180 tablet 3  . clopidogrel (PLAVIX) 75 MG tablet TAKE 1 TABLET BY MOUTH DAILY 90 tablet 3  . diazepam (VALIUM) 10 MG tablet TAKE 1/2 TO 1 (ONE-HALF TO ONE) TABLET BY MOUTH EVERY 8 HOURS AS NEEDED FOR MUSCLE SPASM OR ANXIETY 30 tablet 1  .  diphenoxylate-atropine (LOMOTIL) 2.5-0.025 MG tablet TAKE 2 TABLETS BY MOUTH FOUR TIMES A DAY AS NEEDED FOR DIARRHEA OR LOOSE STOOLS 30 tablet 3  . furosemide (LASIX) 20 MG tablet Take 20 mg by mouth daily as needed for fluid or edema.    Marland Kitchen guaiFENesin (MUCINEX) 600 MG 12 hr tablet Take 1200 mg by mouth 2-3 times a day as needed for cough or to loosen phlegm    . mexiletine (MEXITIL) 200 MG capsule Take 1 capsule (200 mg total) by mouth 2 (two) times daily. 60 capsule 11  . nitroGLYCERIN (NITROSTAT) 0.4 MG SL tablet DISSOLVE 1 TABLET UNDER THE TONGUE EVERY 5 MINUTES AS NEEDED FOR CHEST PAIN 25 tablet 4  . ondansetron (ZOFRAN) 4 MG tablet Take 1 tablet (4 mg total) by mouth every 8 (eight) hours as needed for nausea or vomiting. 20 tablet 0  . sacubitril-valsartan (ENTRESTO) 49-51 MG Take 1 tablet by mouth 2 (two) times daily. 60 tablet 5  . sildenafil (VIAGRA) 100 MG tablet Take 0.5-1 tablets (50-100 mg total) by mouth daily as needed for erectile dysfunction. 5 tablet 11  . bosutinib (BOSULIF) 500 MG tablet Take 1 tablet (500 mg total) daily with breakfast by mouth. Take with food. (Patient not taking: Reported on 01/22/2018) 30 tablet 0   No current facility-administered medications on file prior to visit.    Allergies  Allergen Reactions  . Norvasc [Amlodipine Besylate] Rash    rash  . Sulfa Antibiotics Hives    Hives    Social History   Socioeconomic History  . Marital status: Married    Spouse name: Not on file  . Number of children: 2  . Years of education: Not on file  . Highest education level: Not on file  Occupational History    Comment: retired Development worker, international aid  Social Needs  . Financial resource strain: Not on file  . Food insecurity:    Worry: Not on file    Inability: Not on file  . Transportation needs:    Medical: Not on file    Non-medical: Not on file  Tobacco Use  . Smoking status: Former Smoker    Packs/day: 3.00    Years: 45.00    Pack years: 135.00    Types:  Cigarettes    Last attempt to quit: 08/08/1997    Years since quitting: 20.4  . Smokeless tobacco: Former Network engineer and Sexual Activity  . Alcohol use: No    Alcohol/week: 0.0 oz  . Drug use: No  . Sexual activity: Yes  Lifestyle  . Physical activity:    Days per week: Not on file    Minutes per session: Not on file  . Stress: Not on file  Relationships  . Social connections:    Talks on phone: Not on file    Gets together: Not on  file    Attends religious service: Not on file    Active member of club or organization: Not on file    Attends meetings of clubs or organizations: Not on file    Relationship status: Not on file  . Intimate partner violence:    Fear of current or ex partner: Not on file    Emotionally abused: Not on file    Physically abused: Not on file    Forced sexual activity: Not on file  Other Topics Concern  . Not on file  Social History Narrative  . Not on file      Review of Systems  Gastrointestinal: Positive for diarrhea.  All other systems reviewed and are negative.      Objective:   Physical Exam  Constitutional: He appears well-developed and well-nourished.  Cardiovascular: Normal rate, regular rhythm and normal heart sounds.  No murmur heard. Pulmonary/Chest: Effort normal and breath sounds normal. No respiratory distress. He has no wheezes. He has no rales.  Abdominal: Soft. Bowel sounds are normal.  Musculoskeletal:       Left foot: There is decreased range of motion, tenderness and bony tenderness.       Feet:  Vitals reviewed.         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  We discussed options including rest, elevation, ice versus trying prednisone taper pack.  Patient states that with the level of pain he is experiencing, he would like to try the prednisone taper pack.  Reassess if no better in 1 week or sooner if worse

## 2018-01-23 ENCOUNTER — Telehealth: Payer: Self-pay

## 2018-01-23 MED ORDER — PREDNISONE 20 MG PO TABS: ORAL_TABLET | ORAL | 0 refills | Status: DC

## 2018-01-23 NOTE — Telephone Encounter (Signed)
Patient Wife came in and states Prednisone 20mg  is on back order at [facility] and requested hard copy of the rx. PCP gave verbal to print out a new rx to give to the patient

## 2018-02-01 ENCOUNTER — Other Ambulatory Visit: Payer: Self-pay | Admitting: Family Medicine

## 2018-02-01 MED ORDER — SACUBITRIL-VALSARTAN 24-26 MG PO TABS: 1.0000 | ORAL_TABLET | Freq: Two times a day (BID) | ORAL | 6 refills | Status: DC

## 2018-03-01 ENCOUNTER — Other Ambulatory Visit: Payer: Self-pay | Admitting: Family Medicine

## 2018-03-07 ENCOUNTER — Other Ambulatory Visit: Payer: Self-pay | Admitting: Family Medicine

## 2018-03-07 NOTE — Telephone Encounter (Signed)
Ok to refill??  Last office visit 01/22/2018.  Last refill 01/05/2018.

## 2018-03-12 ENCOUNTER — Other Ambulatory Visit: Payer: Self-pay | Admitting: Hematology and Oncology

## 2018-03-12 ENCOUNTER — Ambulatory Visit: Payer: Medicare HMO | Admitting: Nurse Practitioner

## 2018-03-12 DIAGNOSIS — C9210 Chronic myeloid leukemia, BCR/ABL-positive, not having achieved remission: Secondary | ICD-10-CM

## 2018-03-19 ENCOUNTER — Inpatient Hospital Stay: Payer: Medicare HMO | Attending: Hematology and Oncology

## 2018-03-19 DIAGNOSIS — D6481 Anemia due to antineoplastic chemotherapy: Secondary | ICD-10-CM | POA: Insufficient documentation

## 2018-03-19 DIAGNOSIS — C9210 Chronic myeloid leukemia, BCR/ABL-positive, not having achieved remission: Secondary | ICD-10-CM | POA: Insufficient documentation

## 2018-03-19 DIAGNOSIS — T451X5A Adverse effect of antineoplastic and immunosuppressive drugs, initial encounter: Secondary | ICD-10-CM | POA: Diagnosis not present

## 2018-03-19 DIAGNOSIS — I255 Ischemic cardiomyopathy: Secondary | ICD-10-CM | POA: Diagnosis not present

## 2018-03-19 DIAGNOSIS — K521 Toxic gastroenteritis and colitis: Secondary | ICD-10-CM | POA: Diagnosis not present

## 2018-03-19 LAB — CBC WITH DIFFERENTIAL/PLATELET
Basophils Absolute: 0.1 10*3/uL (ref 0.0–0.1)
Basophils Relative: 1 %
EOS ABS: 0.1 10*3/uL (ref 0.0–0.5)
Eosinophils Relative: 2 %
HEMATOCRIT: 38.3 % — AB (ref 38.4–49.9)
HEMOGLOBIN: 12.7 g/dL — AB (ref 13.0–17.1)
LYMPHS ABS: 0.9 10*3/uL (ref 0.9–3.3)
Lymphocytes Relative: 16 %
MCH: 30.8 pg (ref 27.2–33.4)
MCHC: 33.1 g/dL (ref 32.0–36.0)
MCV: 93.2 fL (ref 79.3–98.0)
MONOS PCT: 9 %
Monocytes Absolute: 0.5 10*3/uL (ref 0.1–0.9)
NEUTROS PCT: 72 %
Neutro Abs: 4 10*3/uL (ref 1.5–6.5)
Platelets: 163 10*3/uL (ref 140–400)
RBC: 4.11 MIL/uL — ABNORMAL LOW (ref 4.20–5.82)
RDW: 17.8 % — ABNORMAL HIGH (ref 11.0–14.6)
WBC: 5.5 10*3/uL (ref 4.0–10.3)

## 2018-03-19 LAB — COMPREHENSIVE METABOLIC PANEL
ALK PHOS: 88 U/L (ref 38–126)
ALT: 23 U/L (ref 0–44)
ANION GAP: 9 (ref 5–15)
AST: 16 U/L (ref 15–41)
Albumin: 3.3 g/dL — ABNORMAL LOW (ref 3.5–5.0)
BUN: 12 mg/dL (ref 8–23)
CALCIUM: 8.4 mg/dL — AB (ref 8.9–10.3)
CO2: 25 mmol/L (ref 22–32)
Chloride: 110 mmol/L (ref 98–111)
Creatinine, Ser: 1.07 mg/dL (ref 0.61–1.24)
GFR calc Af Amer: >60 mL/min (ref >60)
Glucose, Bld: 102 mg/dL — ABNORMAL HIGH (ref 70–99)
Potassium: 4.1 mmol/L (ref 3.5–5.1)
SODIUM: 144 mmol/L (ref 135–145)
TOTAL PROTEIN: 6.2 g/dL — AB (ref 6.5–8.1)
Total Bilirubin: 0.4 mg/dL (ref 0.3–1.2)

## 2018-03-23 ENCOUNTER — Encounter: Payer: Self-pay | Admitting: Family Medicine

## 2018-03-23 ENCOUNTER — Ambulatory Visit (INDEPENDENT_AMBULATORY_CARE_PROVIDER_SITE_OTHER): Payer: Medicare HMO | Admitting: Family Medicine

## 2018-03-23 ENCOUNTER — Ambulatory Visit (HOSPITAL_COMMUNITY)
Admission: RE | Admit: 2018-03-23 | Discharge: 2018-03-23 | Disposition: A | Discharge status: Home / Self Care | Payer: Medicare HMO | Source: Ambulatory Visit | Attending: Family Medicine | Admitting: Family Medicine

## 2018-03-23 VITALS — BP 142/78 | HR 78 | Temp 98.0°F | Resp 18 | Ht 69.0 in | Wt 182.0 lb

## 2018-03-23 DIAGNOSIS — M79672 Pain in left foot: Secondary | ICD-10-CM | POA: Diagnosis not present

## 2018-03-23 DIAGNOSIS — M109 Gout, unspecified: Secondary | ICD-10-CM

## 2018-03-23 DIAGNOSIS — M7989 Other specified soft tissue disorders: Secondary | ICD-10-CM | POA: Diagnosis not present

## 2018-03-23 MED ORDER — PREDNISONE 20 MG PO TABS: ORAL_TABLET | ORAL | 0 refills | Status: DC

## 2018-03-23 NOTE — Telephone Encounter (Signed)
This encounter was created in error - please disregard.

## 2018-03-23 NOTE — Progress Notes (Signed)
Subjective:    Patient ID: Anthony Wolfe, male    DOB: 09-May-1946, 72 y.o.   MRN: [id number] 01/22/18 Patient report pain in the right first MTP joint.  Pain is been present for approximately 1 week.  It hurts to flex and extend the joint.  He denies any injuries or falls.  The joint is slightly swollen.  It is not erythematous.  But it is warm to the touch.  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  We discussed options including rest, elevation, ice versus trying prednisone taper pack.  Patient states that with the level of pain he is experiencing, he would like to try the prednisone taper pack.  Reassess if no better in 1 week or sooner if worse  03/23/18 Patient states that the prednisone "knocked the pain out" and just 2 days last time.  He was doing fine until recently when he was on his knees with his MTP joints flexed in a squatting position using a wood splitter.  The next day he reports 9 out of 10 pain in his right first MTP joint.  Today it is exquisitely swollen.  It is painful to palpation.  There is no erythema.  Denies any fever.  He has a difficult time walking.  He is requesting prednisone again Past Medical History:  Diagnosis Date  . AICD (automatic cardioverter/defibrillator) present   . Allergic rhinitis   . 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 Anxiety   . Blood dyscrasia    cmleukemia  . C. difficile colitis 06/16/2014  . CAD (coronary artery disease)    a. s/p 3v CABG 2010. b. NSTEMI 05/2014 in setting of VT. Cath impressions: "Recent IMI with occluded SVG to RCA. Has Right to Right collaterals and collaterals from septal and OM. EF 40%."  . Carotid disease, bilateral ([place])    a. Right CEA 2002; known occluded left carotid. b. Duplex 06/2014: known occluded LICA, stable 8-29% RICA s/p CEA.  . Chronic systolic CHF (congestive heart failure) ([place])   . CML (chronic myelocytic leukemia) ([place]) 05/09/2012  . Depression   .  Emphysema of lung ([place])   . HTN (hypertension)   . Hyperlipidemia   . Ischemic cardiomyopathy    a. Prior EF 36%. b. 2014: 50%. c. 05/2014: EF 35-40% by echo, 40% with inf HK by cath.  . NSTEMI (non-ST elevation myocardial infarction) ([place]) 05/2014  . Pleural effusion 05/2014  . Pneumonia 05/23/2014  . Presence of permanent cardiac pacemaker   . Prostate hypertrophy   . PVC's (premature ventricular contractions)    a. PVC's with prior Holter showing PVC load of 22%.  . Shortness of breath dyspnea   . Stroke ([place]) 01  . TIA (transient ischemic attack)    ASPVD, S./P. right CEA, 2002, and known occluded left carotid  . Ventricular tachycardia ([place])    a. Admitted with VT 05/2013 - EPS with inducible sustained monomorphic VT; s/p Med[employer] ICD 05/21/14.   Past Surgical History:  Procedure Laterality Date  . BACK SURGERY    . CAROTID ENDARTERECTOMY Right 01  . carotidectomy  2003   right side  . COLONOSCOPY  07/2010  . CORONARY ARTERY BYPASS GRAFT  02/2009   3 vessel  . ELECTROPHYSIOLOGIC STUDY  05/21/14  . ELECTROPHYSIOLOGY STUDY N/A 05/21/2014   Procedure: ELECTROPHYSIOLOGY STUDY;  Surgeon: Deboraha Sprang, MD;  Location: [facility] CATH LAB;  Service: Cardiovascular;  Laterality: N/A;  . EP IMPLANTABLE  DEVICE  05/21/14   single chamber Metronic ICD  . LEFT HEART CATHETERIZATION WITH CORONARY/GRAFT ANGIOGRAM N/A 05/19/2014   Procedure: LEFT HEART CATHETERIZATION WITH Beatrix Fetters;  Surgeon: Josue Hector, MD;  Location: [facility] CATH LAB;  Service: Cardiovascular;  Laterality: N/A;  . PLEURAL BIOPSY N/A 11/27/2014   Procedure: PLEURAL BIOPSY;  Surgeon: [name], MD;  Location: [place];  Service: Thoracic;  Laterality: N/A;  . PLEURAL EFFUSION DRAINAGE Right 11/27/2014   Procedure: DRAINAGE OF PLEURAL EFFUSION;  Surgeon: [name], MD;  Location: [place];  Service: Thoracic;  Laterality: Right;  . TALC PLEURODESIS Right 11/27/2014   Procedure: Pietro Cassis;   Surgeon: [name], MD;  Location: [place];  Service: Thoracic;  Laterality: Right;  Marland Kitchen VIDEO ASSISTED THORACOSCOPY Right 11/27/2014   Procedure: RIGHT VIDEO ASSISTED THORACOSCOPY;  Surgeon: [name], MD;  Location: [place];  Service: Thoracic;  Laterality: Right;   Current Outpatient Medications on File Prior to Visit  Medication Sig Dispense Refill  . atorvastatin (LIPITOR) 80 MG tablet TAKE ONE TABLET BY MOUTH ONCE DAILY AT 6 PM. 90 tablet 4  . carvedilol (COREG) 12.5 MG tablet Take 1 tablet (12.5 mg total) by mouth 2 (two) times daily with a meal. 180 tablet 3  . clopidogrel (PLAVIX) 75 MG tablet TAKE 1 TABLET BY MOUTH DAILY 90 tablet 3  . diazepam (VALIUM) 10 MG tablet TAKE 0.5 TO 1 (ONE-HALF TO ONE) TABLET BY MOUTH EVERY 8 HOURS AS NEEDED FOR MUSCLE SPASM OR  ANXIETY 30 tablet 1  . diphenoxylate-atropine (LOMOTIL) 2.5-0.025 MG tablet TAKE 2 TABLETS BY MOUTH FOUR TIMES A DAY AS NEEDED FOR DIARRHEA OR LOOSE STOOLS 30 tablet 3  . furosemide (LASIX) 20 MG tablet Take 20 mg by mouth daily as needed for fluid or edema.    Marland Kitchen guaiFENesin (MUCINEX) 600 MG 12 hr tablet Take 1200 mg by mouth 2-3 times a day as needed for cough or to loosen phlegm    . mexiletine (MEXITIL) 200 MG capsule Take 1 capsule (200 mg total) by mouth 2 (two) times daily. 60 capsule 11  . nitroGLYCERIN (NITROSTAT) 0.4 MG SL tablet DISSOLVE 1 TABLET UNDER THE TONGUE EVERY 5 MINUTES AS NEEDED FOR CHEST PAIN 25 tablet 4  . ondansetron (ZOFRAN) 4 MG tablet Take 1 tablet (4 mg total) by mouth every 8 (eight) hours as needed for nausea or vomiting. 20 tablet 0  . predniSONE (DELTASONE) 20 MG tablet 3 tabs poqday 1-2, 2 tabs poqday 3-4, 1 tab poqday 5-6 12 tablet 0  . sacubitril-valsartan (ENTRESTO) 24-26 MG Take 1 tablet by mouth 2 (two) times daily. 60 tablet 6  . sildenafil (VIAGRA) 100 MG tablet Take 0.5-1 tablets (50-100 mg total) by mouth daily as needed for erectile dysfunction. 5 tablet 11  . bosutinib  (BOSULIF) 500 MG tablet Take 1 tablet (500 mg total) daily with breakfast by mouth. Take with food. (Patient not taking: Reported on 03/23/2018) 30 tablet 0   No current facility-administered medications on file prior to visit.    Allergies  Allergen Reactions  . Norvasc [Amlodipine Besylate] Rash    rash  . Sulfa Antibiotics Hives    Hives    Social History   Socioeconomic History  . Marital status: Married    Spouse name: Not on file  . Number of children: 2  . Years of education: Not on file  . Highest education level: Not on file  Occupational History    Comment:  retired Development worker, [employer]  Social Needs  . Financial resource strain: Not on file  . Food insecurity:    Worry: Not on file    Inability: Not on file  . Transportation needs:    Medical: Not on file    Non-medical: Not on file  Tobacco Use  . Smoking status: Former Smoker    Packs/day: 3.00    Years: 45.00    Pack years: 135.00    Types: Cigarettes    Last attempt to quit: 08/08/1997    Years since quitting: 20.6  . Smokeless tobacco: Former Network engineer and Sexual Activity  . Alcohol use: No    Alcohol/week: 0.0 standard drinks  . Drug use: No  . Sexual activity: Yes  Lifestyle  . Physical activity:    Days per week: Not on file    Minutes per session: Not on file  . Stress: Not on file  Relationships  . Social connections:    Talks on phone: Not on file    Gets together: Not on file    Attends religious service: Not on file    Active member of club or organization: Not on file    Attends meetings of clubs or organizations: Not on file    Relationship status: Not on file  . Intimate partner violence:    Fear of current or ex partner: Not on file    Emotionally abused: Not on file    Physically abused: Not on file    Forced sexual activity: Not on file  Other Topics Concern  . Not on file  Social History Narrative  . Not on file      Review of Systems  Gastrointestinal: Positive for  diarrhea.  All other systems reviewed and are negative.      Objective:   Physical Exam  Constitutional: He appears well-developed and well-nourished.  Cardiovascular: Normal rate, regular rhythm and normal heart sounds.  No murmur heard. Pulmonary/Chest: Effort normal and breath sounds normal. No respiratory distress. He has no wheezes. He has no rales.  Abdominal: Soft. Bowel sounds are normal.  Musculoskeletal:       Left foot: There is decreased range of motion, tenderness and bony tenderness.       Feet:  Vitals reviewed.         Assessment & Plan:  Podagra - Plan: Uric acid, DG Foot Complete Left, predniSONE (DELTASONE) 20 MG tablet I explained to the patient that we need to determine what is causing the attacks to recur.  It is possible that this is recurrent gout and that this represents podagra.  I will check a uric acid level.  However I have also recommended an x-ray as this could simply be osteoarthritis aggravated by activity.  So I will obtain an x-ray to confirm that.  If he does have significant osteoarthritis, he is going to have to undergo activity modification as I explained to him that prednisone is not safe to take repeatedly or long-term.

## 2018-03-24 LAB — URIC ACID: URIC ACID, SERUM: 5.7 mg/dL (ref 4.0–8.0)

## 2018-03-26 ENCOUNTER — Telehealth: Payer: Self-pay | Admitting: Hematology and Oncology

## 2018-03-26 ENCOUNTER — Telehealth: Payer: Self-pay

## 2018-03-26 ENCOUNTER — Encounter: Payer: Self-pay | Admitting: Hematology and Oncology

## 2018-03-26 ENCOUNTER — Inpatient Hospital Stay: Payer: Medicare HMO | Admitting: Hematology and Oncology

## 2018-03-26 VITALS — BP 143/76 | HR 62 | Temp 98.0°F | Resp 18 | Ht 69.0 in | Wt 185.9 lb

## 2018-03-26 DIAGNOSIS — T451X5A Adverse effect of antineoplastic and immunosuppressive drugs, initial encounter: Secondary | ICD-10-CM | POA: Diagnosis not present

## 2018-03-26 DIAGNOSIS — C9210 Chronic myeloid leukemia, BCR/ABL-positive, not having achieved remission: Secondary | ICD-10-CM | POA: Diagnosis not present

## 2018-03-26 DIAGNOSIS — K521 Toxic gastroenteritis and colitis: Secondary | ICD-10-CM

## 2018-03-26 DIAGNOSIS — D6481 Anemia due to antineoplastic chemotherapy: Secondary | ICD-10-CM | POA: Diagnosis not present

## 2018-03-26 DIAGNOSIS — I255 Ischemic cardiomyopathy: Secondary | ICD-10-CM

## 2018-03-26 NOTE — Assessment & Plan Note (Signed)
Clinically, he does not have signs of volume overload. He will continue medical management. 

## 2018-03-26 NOTE — Assessment & Plan Note (Signed)
This is likely due to recent treatment. The patient denies recent history of bleeding such as epistaxis, hematuria or hematochezia. He is asymptomatic from the anemia. I will observe for now.    

## 2018-03-26 NOTE — Telephone Encounter (Signed)
Called per Dr. Alvy Bimler and given BCR/ABL1 results, not detected. Wife verbalized understanding.

## 2018-03-26 NOTE — Telephone Encounter (Signed)
Gave avs and calendar ° °

## 2018-03-26 NOTE — Assessment & Plan Note (Addendum)
The patient tolerated treatment well except for diarrhea. He has achieved major molecular response I plan to see him back in 3 months I plan to discuss potential discontinuation of treatment with him and his wife in the next visit

## 2018-03-26 NOTE — Assessment & Plan Note (Signed)
He is able to get his diarrhea under control with treatment. I recommend continues to take Imodium or Lomotil as needed to control his diarrhea and so far he is doing well

## 2018-03-26 NOTE — Progress Notes (Signed)
[facility] OFFICE PROGRESS NOTE  Patient Care Team: [name], MD as PCP - General (Family Medicine) [name]., MD as Attending Physician (Cardiology) [name], MD (Family Medicine) [name], MD (Cardiothoracic Surgery) Early, Arvilla Meres, MD as Attending Physician (Vascular Surgery) [name], MD as Consulting Physician (Hematology and Oncology) Deboraha Sprang, MD as Consulting Physician (Cardiology)  ASSESSMENT & PLAN:  Chronic myelogenous leukemia The patient tolerated treatment well except for diarrhea. He has achieved major molecular response I plan to see him back in 3 months I plan to discuss potential discontinuation of treatment with him and his wife in the next visit   Anemia due to antineoplastic chemotherapy This is likely due to recent treatment. The patient denies recent history of bleeding such as epistaxis, hematuria or hematochezia. He is asymptomatic from the anemia. I will observe for now  Cardiomyopathy, ischemic Clinically, he does not have signs of volume overload. He will continue medical management.  Diarrhea due to drug He is able to get his diarrhea under control with treatment. I recommend continues to take Imodium or Lomotil as needed to control his diarrhea and so far he is doing well   Orders Placed This Encounter  Procedures  . Comprehensive metabolic panel    Standing Status:   Future    Standing Expiration Date:   04/30/2019  . CBC with Differential/Platelet    Standing Status:   Future    Standing Expiration Date:   04/30/2019  . BCR-ABL    With RT-PCR technique    Standing Status:   Future    Standing Expiration Date:   04/30/2019    INTERVAL HISTORY: Please see below for problem oriented charting. He returns with his wife for further follow-up He feels well He continues to have intermittent diarrhea, stable No recent infection, fever or chills No recent cough, chest pain or shortness  of breath  SUMMARY OF ONCOLOGIC HISTORY:   Chronic myelogenous leukemia ([place])   05/04/2012 Bone Marrow Biopsy    BM confirmed diagnosis of CML in Chronic phase. BCR/ABL by PCR detected abnormalitis with b2a2 & b3a2 subtypes    05/09/2012 - 03/16/2015 Chemotherapy    He was started on treatment with Dasatinib 100 mg daily    06/06/2012 Adverse Reaction    Dose of medication was reduced to 50 mg daily due to pancytopenia    01/24/2013 Progression    Patient was noted to have elevated blood count which and was subsequently found to be noncompliant to treatment. The patient has not been on treatment for several months because his prescription ran out. He was restarted back on treatment    01/16/2014 Progression    Bcr/ABL by PCR is worse. Dose of Dasatinib was increased to 100 mg.    02/26/2014 Tumor Marker    Blood work for ABL kinase mutation was negative.    10/29/2014 Tumor Marker    BCR/ABL b2a2 & b3a2 0.29%, IS 0.1624%, not in MMR yet but improving    11/05/2014 Adverse Reaction    He had thoracentesis due to pleural effusion    11/27/2014 Surgery    He underwent right video-assisted thoracoscopy, Drainage of pleural effusion, Pleural biopsy, Diaphragm biopsy, Lung biopsy & Talc pleurodesis    01/30/2015 Tumor Marker    BCR/ABL b2a2 & b3a2 0.78%, IS 0.4368%, not in MMR     03/11/2015 Pathology Results    BCR/ABL b2a2 0.19%, IS 0.1064%, not in MMR  03/16/2015 Adverse Reaction    Dasatinib was stopped due to recurrent pleural effusion    03/28/2015 -  Chemotherapy    He started on Bosutinib    03/30/2015 Procedure    He has therapeutic thoracentesis of the right lung with 1 liter of fluid removed    03/30/2015 Adverse Reaction    Bosutinib is placed on hold, to be restart on 8/25 at 250 mg due to severe diarrhea    04/28/2015 Tumor Marker    BCR/ABL b2a2 0.06%, IS 0.0336%, in MMR     06/10/2015 Tumor Marker    BCR/ABL b2a2 0.14%, IS 0.1162%, not in MMR     08/14/2015 Pathology  Results    BCR/ABL b2a2 0.02%, IS 0.0166%, In MMR     11/09/2015 Tumor Marker    BCR/ABL b2a2 0.005%, IS 0.0042%, In MMR     02/08/2016 Tumor Marker    BCR/ABL undetectable. In MMR    06/13/2016 Tumor Marker    BCR/ABL undetectable. In MMR    12/06/2016 Pathology Results    BCR/ABL undetectable. In MMR    06/08/2017 Pathology Results    BCR/ABL undetectable. In MMR    09/08/2017 Pathology Results    BCR/ABL undetectable. In MMR    12/11/2017 Pathology Results    BCR/ABL undetectable. In MMR    03/19/2018 Pathology Results    BCR/ABL undetectable. In MMR     REVIEW OF SYSTEMS:   Constitutional: Denies fevers, chills or abnormal weight loss Eyes: Denies blurriness of vision Ears, nose, mouth, throat, and face: Denies mucositis or sore throat Respiratory: Denies cough, dyspnea or wheezes Cardiovascular: Denies palpitation, chest discomfort or lower extremity swelling Gastrointestinal:  Denies nausea, heartburn or change in bowel habits Skin: Denies abnormal skin rashes Lymphatics: Denies new lymphadenopathy or easy bruising Neurological:Denies numbness, tingling or new weaknesses Behavioral/Psych: Mood is stable, no new changes  All other systems were reviewed with the patient and are negative.  I have reviewed the past medical history, past surgical history, social history and family history with the patient and they are unchanged from previous note.  ALLERGIES:  is allergic to norvasc [amlodipine besylate] and sulfa antibiotics.  MEDICATIONS:  Current Outpatient Medications  Medication Sig Dispense Refill  . atorvastatin (LIPITOR) 80 MG tablet TAKE ONE TABLET BY MOUTH ONCE DAILY AT 6 PM. 90 tablet 4  . bosutinib (BOSULIF) 500 MG tablet Take 1 tablet (500 mg total) daily with breakfast by mouth. Take with food. (Patient not taking: Reported on 03/23/2018) 30 tablet 0  . carvedilol (COREG) 12.5 MG tablet Take 1 tablet (12.5 mg total) by mouth 2 (two) times daily with a meal. 180  tablet 3  . clopidogrel (PLAVIX) 75 MG tablet TAKE 1 TABLET BY MOUTH DAILY 90 tablet 3  . diazepam (VALIUM) 10 MG tablet TAKE 0.5 TO 1 (ONE-HALF TO ONE) TABLET BY MOUTH EVERY 8 HOURS AS NEEDED FOR MUSCLE SPASM OR  ANXIETY 30 tablet 1  . diphenoxylate-atropine (LOMOTIL) 2.5-0.025 MG tablet TAKE 2 TABLETS BY MOUTH FOUR TIMES A DAY AS NEEDED FOR DIARRHEA OR LOOSE STOOLS 30 tablet 3  . furosemide (LASIX) 20 MG tablet Take 20 mg by mouth daily as needed for fluid or edema.    Marland Kitchen guaiFENesin (MUCINEX) 600 MG 12 hr tablet Take 1200 mg by mouth 2-3 times a day as needed for cough or to loosen phlegm    . mexiletine (MEXITIL) 200 MG capsule Take 1 capsule (200 mg total) by mouth 2 (two) times daily. 60 capsule  11  . nitroGLYCERIN (NITROSTAT) 0.4 MG SL tablet DISSOLVE 1 TABLET UNDER THE TONGUE EVERY 5 MINUTES AS NEEDED FOR CHEST PAIN 25 tablet 4  . ondansetron (ZOFRAN) 4 MG tablet Take 1 tablet (4 mg total) by mouth every 8 (eight) hours as needed for nausea or vomiting. 20 tablet 0  . predniSONE (DELTASONE) 20 MG tablet 3 tabs poqday 1-2, 2 tabs poqday 3-4, 1 tab poqday 5-6 12 tablet 0  . predniSONE (DELTASONE) 20 MG tablet 3 tabs poqday 1-2, 2 tabs poqday 3-4, 1 tab poqday 5-6 12 tablet 0  . sacubitril-valsartan (ENTRESTO) 24-26 MG Take 1 tablet by mouth 2 (two) times daily. 60 tablet 6  . sildenafil (VIAGRA) 100 MG tablet Take 0.5-1 tablets (50-100 mg total) by mouth daily as needed for erectile dysfunction. 5 tablet 11   No current facility-administered medications for this visit.     PHYSICAL EXAMINATION: ECOG PERFORMANCE STATUS: 0 - Asymptomatic  Vitals:   03/26/18 0839  BP: (!) 143/76  Pulse: 62  Resp: 18  Temp: 98 F (36.7 C)  SpO2: 100%   Filed Weights   03/26/18 0839  Weight: 185 lb 14.4 oz (84.3 kg)    GENERAL:alert, no distress and comfortable SKIN: skin color, texture, turgor are normal, no rashes or significant lesions EYES: normal, Conjunctiva are pink and non-injected, sclera  clear OROPHARYNX:no exudate, no erythema and lips, buccal mucosa, and tongue normal  NECK: supple, thyroid normal size, non-tender, without nodularity LYMPH:  no palpable lymphadenopathy in the cervical, axillary or inguinal LUNGS: clear to auscultation and percussion with normal breathing effort HEART: regular rate & rhythm and no murmurs and no lower extremity edema ABDOMEN:abdomen soft, non-tender and normal bowel sounds Musculoskeletal:no cyanosis of digits and no clubbing  NEURO: alert & oriented x 3 with fluent speech, no focal motor/sensory deficits  LABORATORY DATA:  I have reviewed the data as listed    Component Value Date/Time   NA 144 03/19/2018 0744   NA 142 06/08/2017 0832   K 4.1 03/19/2018 0744   K 3.9 06/08/2017 0832   CL 110 03/19/2018 0744   CL 110 (H) 11/14/2012 0918   CO2 25 03/19/2018 0744   CO2 25 06/08/2017 0832   GLUCOSE 102 (H) 03/19/2018 0744   GLUCOSE 98 06/08/2017 0832   GLUCOSE 108 (H) 11/14/2012 0918   BUN 12 03/19/2018 0744   BUN 11.6 06/08/2017 0832   CREATININE 1.07 03/19/2018 0744   CREATININE 1.15 01/16/2018 1205   CREATININE 0.9 06/08/2017 0832   CALCIUM 8.4 (L) 03/19/2018 0744   CALCIUM 8.6 06/08/2017 0832   PROT 6.2 (L) 03/19/2018 0744   PROT 6.7 06/08/2017 0832   ALBUMIN 3.3 (L) 03/19/2018 0744   ALBUMIN 3.4 (L) 06/08/2017 0832   AST 16 03/19/2018 0744   AST 13 06/08/2017 0832   ALT 23 03/19/2018 0744   ALT 7 06/08/2017 0832   ALKPHOS 88 03/19/2018 0744   ALKPHOS 96 06/08/2017 0832   BILITOT 0.4 03/19/2018 0744   BILITOT 0.44 06/08/2017 0832   GFRNONAA >60 03/19/2018 0744   GFRNONAA 63 01/16/2018 1205   GFRAA >60 03/19/2018 0744   GFRAA 73 01/16/2018 1205    No results found for: SPEP, UPEP  Lab Results  Component Value Date   WBC 5.5 03/19/2018   NEUTROABS 4.0 03/19/2018   HGB 12.7 (L) 03/19/2018   HCT 38.3 (L) 03/19/2018   MCV 93.2 03/19/2018   PLT 163 03/19/2018      Chemistry  Component Value Date/Time    NA 144 03/19/2018 0744   NA 142 06/08/2017 0832   K 4.1 03/19/2018 0744   K 3.9 06/08/2017 0832   CL 110 03/19/2018 0744   CL 110 (H) 11/14/2012 0918   CO2 25 03/19/2018 0744   CO2 25 06/08/2017 0832   BUN 12 03/19/2018 0744   BUN 11.6 06/08/2017 0832   CREATININE 1.07 03/19/2018 0744   CREATININE 1.15 01/16/2018 1205   CREATININE 0.9 06/08/2017 0832      Component Value Date/Time   CALCIUM 8.4 (L) 03/19/2018 0744   CALCIUM 8.6 06/08/2017 0832   ALKPHOS 88 03/19/2018 0744   ALKPHOS 96 06/08/2017 0832   AST 16 03/19/2018 0744   AST 13 06/08/2017 0832   ALT 23 03/19/2018 0744   ALT 7 06/08/2017 0832   BILITOT 0.4 03/19/2018 0744   BILITOT 0.44 06/08/2017 0832       RADIOGRAPHIC STUDIES: I have personally reviewed the radiological images as listed and agreed with the findings in the report. Dg Foot Complete Left  Result Date: 03/24/2018 CLINICAL DATA:  Pain and swelling at the base of the great toe in the joint. Possible gout. No known injury. EXAM: LEFT FOOT - COMPLETE 3+ VIEW COMPARISON:  None. FINDINGS: Soft tissue swelling is demonstrated over the dorsum of the distal forefoot and over the fifth metatarsal phalangeal joint. Soft tissue calcifications adjacent to the lateral aspect of the first metatarsal head. Etiology is indeterminate. No periarticular erosions to suggest changes of gout. No evidence of acute fracture or dislocation. No expansile or destructive bone lesions. IMPRESSION: Soft tissue swelling over the dorsum of the distal forefoot and over the fifth metatarsophalangeal joint. Soft tissue calcifications adjacent to the first metatarsal head of nonspecific etiology. No erosive arthritic changes. Electronically Signed   By: Lucienne Capers M.D.   On: 03/24/2018 00:30    All questions were answered. The patient knows to call the clinic with any problems, questions or concerns. No barriers to learning was detected.  I spent 15 minutes counseling the patient face to  face. The total time spent in the appointment was 20 minutes and more than 50% was on counseling and review of test results  [name], MD 03/26/2018 4:25 PM

## 2018-03-28 LAB — BCR/ABL

## 2018-03-30 ENCOUNTER — Encounter: Payer: Self-pay | Admitting: Family Medicine

## 2018-03-30 ENCOUNTER — Ambulatory Visit (INDEPENDENT_AMBULATORY_CARE_PROVIDER_SITE_OTHER): Payer: Medicare HMO | Admitting: Family Medicine

## 2018-03-30 VITALS — BP 90/58 | HR 92 | Temp 98.2°F | Resp 16 | Ht 69.0 in | Wt 189.0 lb

## 2018-03-30 DIAGNOSIS — R197 Diarrhea, unspecified: Secondary | ICD-10-CM | POA: Diagnosis not present

## 2018-03-30 DIAGNOSIS — I951 Orthostatic hypotension: Secondary | ICD-10-CM | POA: Diagnosis not present

## 2018-03-30 DIAGNOSIS — K529 Noninfective gastroenteritis and colitis, unspecified: Secondary | ICD-10-CM

## 2018-03-30 DIAGNOSIS — T675XXA Heat exhaustion, unspecified, initial encounter: Secondary | ICD-10-CM | POA: Diagnosis not present

## 2018-03-30 DIAGNOSIS — I255 Ischemic cardiomyopathy: Secondary | ICD-10-CM

## 2018-03-30 LAB — CBC WITH DIFFERENTIAL/PLATELET
BASOS ABS: 38 {cells}/uL (ref 0–200)
Basophils Relative: 0.4 %
Eosinophils Absolute: 133 cells/uL (ref 15–500)
Eosinophils Relative: 1.4 %
HEMATOCRIT: 40.4 % (ref 38.5–50.0)
HEMOGLOBIN: 13.3 g/dL (ref 13.2–17.1)
LYMPHS ABS: 1055 {cells}/uL (ref 850–3900)
MCH: 31.2 pg (ref 27.0–33.0)
MCHC: 32.9 g/dL (ref 32.0–36.0)
MCV: 94.8 fL (ref 80.0–100.0)
MPV: 10.4 fL (ref 7.5–12.5)
Monocytes Relative: 8.6 %
NEUTROS ABS: 7458 {cells}/uL (ref 1500–7800)
NEUTROS PCT: 78.5 %
Platelets: 208 10*3/uL (ref 140–400)
RBC: 4.26 10*6/uL (ref 4.20–5.80)
RDW: 15.6 % — ABNORMAL HIGH (ref 11.0–15.0)
Total Lymphocyte: 11.1 %
WBC: 9.5 10*3/uL (ref 3.8–10.8)
WBCMIX: 817 {cells}/uL (ref 200–950)

## 2018-03-30 LAB — COMPLETE METABOLIC PANEL WITH GFR
AG Ratio: 1.8 (calc) (ref 1.0–2.5)
ALBUMIN MSPROF: 3.5 g/dL — AB (ref 3.6–5.1)
ALKALINE PHOSPHATASE (APISO): 76 U/L (ref 40–115)
ALT: 17 U/L (ref 9–46)
AST: 10 U/L (ref 10–35)
BILIRUBIN TOTAL: 0.7 mg/dL (ref 0.2–1.2)
BUN / CREAT RATIO: 17 (calc) (ref 6–22)
BUN: 22 mg/dL (ref 7–25)
CO2: 28 mmol/L (ref 20–32)
CREATININE: 1.31 mg/dL — AB (ref 0.70–1.18)
Calcium: 8.6 mg/dL (ref 8.6–10.3)
Chloride: 109 mmol/L (ref 98–110)
GFR, EST AFRICAN AMERICAN: 62 mL/min/{1.73_m2} (ref >=60)
GFR, Est Non African American: 53 mL/min/{1.73_m2} — ABNORMAL LOW (ref >=60)
GLOBULIN: 2 g/dL (ref 1.9–3.7)
Glucose, Bld: 135 mg/dL — ABNORMAL HIGH (ref 65–99)
Potassium: 3.8 mmol/L (ref 3.5–5.3)
SODIUM: 146 mmol/L (ref 135–146)
TOTAL PROTEIN: 5.5 g/dL — AB (ref 6.1–8.1)

## 2018-03-30 NOTE — Progress Notes (Signed)
Subjective:    Patient ID: Anthony Wolfe, male    DOB: 08-May-1946, 72 y.o.   MRN: [id number]  Diarrhea      11/2017 Up until last week, the patient was doing well.  Starting last week he developed a queasy feeling in his stomach.  He reported early satiety, no appetite, nausea, he has chronic diarrhea.  He denies fever.  He denies melena.  He denies hematochezia.  He denies any vomiting.  Symptoms lasted 3 or 4 days.  2 days ago, he took Pepto-Bismol.  After that, his symptoms subsided.  He continues to have chronic diarrhea likely related to his cancer medication but otherwise the GI symptoms have completely gone away.  At that time, my plan was: Exam is reassuring.  Symptoms have completely subsided.  Patient was concerned the medication valsartan was causing his symptoms.  He saw an ad on television stating that this medication can cause cancer and he was worried that he may have stomach cancer.  I reassured the patient that I am certain that the valsartan was not causing any of his symptoms.  I also tried to assuage his fears regarding the linkage between valsartan, the contaminant NDEA, and cancer.  His manufacturer has not been recalled.  I recommended that he remain on the medication given his underlying congestive heart failure especially since he cannot afford entresto.  For the time being and we will clinically monitor the patient.  If symptoms return, we will institute a more thorough GI workup.  At the present time I suspect that this may have just been a mild stomach virus or possibly related to his bosulif.   01/05/18 Patient is here today for a general checkup.  He states that his breathing is much improved since he resumed Entresto 24/26 p.o. twice daily.  He denies any lightheadedness.  He denies any presyncope.  He is due to recheck renal function and potassium on the medication.  He continues to have some mild dyspnea on exertion however this is much improved on the [place] compared  to without.  He is willing to pay the cost of the medication due to the benefit that he receives from it.  He denies any orthopnea.  He denies any paroxysmal nocturnal dyspnea.  He denies any arrhythmias or palpitations.  He denies any syncope.  He does have underlying COPD but he denies any cough, wheezing, or hemoptysis.  He too is due for a screening colonoscopy.  He is requesting that I schedule this for him.  At that time, my plan was: I would increase Entresto to 49/51 1 p.o. twice daily to optimize treatment for his congestive heart failure/ischemic cardiomyopathy.  Otherwise he is on appropriate medication for his history of coronary artery disease.  He is on an antiplatelet agent, Plavix.  He is on a beta-blocker carvedilol that is not exacerbating his COPD.  He is on Entresto as dictated above.  He is on high-dose statin, 80 mg of Lipitor.  I will check a fasting lipid panel.  Goal LDL cholesterol is less than 70.  His COPD is stable.  This is the best the patient has felt in quite some time.  Therefore I will go ahead and schedule GI consultation to arrange colonoscopy to update his general health preventative screening  01/16/18 Patient presents today complaining of feeling weak and lightheaded.  He feels extremely tired and dizzy.  Recently he has been working in his garden and whenever he he stands up  after bending over, he will feel lightheaded like he may pass out.  His blood pressure is extremely low at 80/42 today.  He is only noticed the symptoms over the last 3 to 4 days.  During that time, he is not eating as well.  Furthermore he has had increased diarrhea recently.  He has chronic diarrhea which has been attributed to his cancer medication in the past.  However the patient has independently discontinued the cancer medication in April and the diarrhea has not improved.  His wife and the patient are both concerned that he may have a recurrent bout of C. difficile colitis as he is experienced  this in the past.  He denies any chest pain shortness of breath or dyspnea on exertion.  At that time, my plan was: I believe the patient is experiencing severe orthostatic hypotension due to multiple factors including dehydration possibly from diarrhea, overmedication due to the recent increased dose of Entresto, and decreased oral intake over the last 3 to 4 days.  Therefore I will give the patient IV fluids slowly given his congestive heart failure.  He will be given 500 cc of normal saline slowly today in clinic.  I will check a CBC to rule out anemia.  I will check a CMP to rule out kidney failure or severe dehydration.  I will check a GI pathogen panel stool test to rule out infectious causes of diarrhea.  I will increase the patient's dose of Lomotil to 2 tablets 4 times a day to try to stop the diarrhea to calm the dehydration and I will temporarily discontinue his Delene Loll and then see the patient back in clinic on Thursday to see if he is feeling better.  If symptoms worsen he will need to go to the hospital  03/30/18 Patient symptoms improved after temporarily discontinuing Entresto.  We resumed his Entresto albeit at a lower dose.  He is currently taking 24/26 1 p.o. twice daily.  Patient independently discontinued his medication prescribed by his oncologist due to diarrhea.  He stopped taking the medication in April however his diarrhea has still not improved.  He reports yellow watery stool twice a day every day.  He previously canceled his appointment to see the gastroenterologist for his chronic diarrhea.  He denies any fevers or chills.  He denies any weight loss.  However the diarrhea could be contributing to his dehydration.  Also over the last 2 to 3 days has been doing an excessive amount of work in the heat.  The air conditioner went out in his home.  Therefore he independently put a 24,000 BTU window unit into his home.  This was extremely heavy.  His wife states that he could ring the  sweat out of his shirt.  Over the last 2 days he has been working in his garden out in the heat and also working on the air conditioner sweating excessively.  Due to the combination of sweat, heavy physical manual labor in the heat, and Entresto, his blood pressure today is relatively low at 90/58.  He reports orthostatic dizziness and weakness.  He denies any chest pain or shortness of breath or dyspnea on exertion.  He does report dizziness with standing and feeling lightheaded with standing. Past Medical History:  Diagnosis Date  . AICD (automatic cardioverter/defibrillator) present   . Allergic rhinitis   . 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 Anxiety   . Blood dyscrasia    cmleukemia  . C. difficile colitis  06/16/2014  . CAD (coronary artery disease)    a. s/p 3v CABG 2010. b. NSTEMI 05/2014 in setting of VT. Cath impressions: "Recent IMI with occluded SVG to RCA. Has Right to Right collaterals and collaterals from septal and OM. EF 40%."  . Carotid disease, bilateral ([place])    a. Right CEA 2002; known occluded left carotid. b. Duplex 06/2014: known occluded LICA, stable 3-53% RICA s/p CEA.  . Chronic systolic CHF (congestive heart failure) ([place])   . 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 Depression   . Emphysema of lung ([place])   . HTN (hypertension)   . Hyperlipidemia   . Ischemic cardiomyopathy    a. Prior EF 36%. b. 2014: 50%. c. 05/2014: EF 35-40% by echo, 40% with inf HK by cath.  . NSTEMI (non-ST elevation myocardial infarction) ([place]) 05/2014  . Pleural effusion 05/2014  . Pneumonia 05/23/2014  . Presence of permanent cardiac pacemaker   . Prostate hypertrophy   . PVC's (premature ventricular contractions)    a. PVC's with prior Holter showing PVC load of 22%.  . Shortness of breath dyspnea   . Stroke ([place]) 01  . TIA (transient ischemic attack)    ASPVD, S./P. right CEA, 2002, and known occluded left carotid  . Ventricular tachycardia ([place])    a. Admitted  with VT 05/2013 - EPS with inducible sustained monomorphic VT; s/p Medtronic ICD 05/21/14.   Past Surgical History:  Procedure Laterality Date  . BACK SURGERY    . CAROTID ENDARTERECTOMY Right 01  . carotidectomy  2003   right side  . COLONOSCOPY  07/2010  . CORONARY ARTERY BYPASS GRAFT  02/2009   3 vessel  . ELECTROPHYSIOLOGIC STUDY  05/21/14  . ELECTROPHYSIOLOGY STUDY N/A 05/21/2014   Procedure: ELECTROPHYSIOLOGY STUDY;  Surgeon: Deboraha Sprang, MD;  Location: [facility] CATH LAB;  Service: Cardiovascular;  Laterality: N/A;  . EP IMPLANTABLE DEVICE  05/21/14   single chamber Metronic ICD  . LEFT HEART CATHETERIZATION WITH CORONARY/GRAFT ANGIOGRAM N/A 05/19/2014   Procedure: LEFT HEART CATHETERIZATION WITH Beatrix Fetters;  Surgeon: Josue Hector, MD;  Location: [facility] CATH LAB;  Service: Cardiovascular;  Laterality: N/A;  . PLEURAL BIOPSY N/A 11/27/2014   Procedure: PLEURAL BIOPSY;  Surgeon: [name], MD;  Location: [place];  Service: Thoracic;  Laterality: N/A;  . PLEURAL EFFUSION DRAINAGE Right 11/27/2014   Procedure: DRAINAGE OF PLEURAL EFFUSION;  Surgeon: [name], MD;  Location: [place];  Service: Thoracic;  Laterality: Right;  . TALC PLEURODESIS Right 11/27/2014   Procedure: Pietro Cassis;  Surgeon: [name], MD;  Location: [place];  Service: Thoracic;  Laterality: Right;  Marland Kitchen VIDEO ASSISTED THORACOSCOPY Right 11/27/2014   Procedure: RIGHT VIDEO ASSISTED THORACOSCOPY;  Surgeon: [name], MD;  Location: [place];  Service: Thoracic;  Laterality: Right;   Current Outpatient Medications on File Prior to Visit  Medication Sig Dispense Refill  . atorvastatin (LIPITOR) 80 MG tablet TAKE ONE TABLET BY MOUTH ONCE DAILY AT 6 PM. 90 tablet 4  . carvedilol (COREG) 12.5 MG tablet Take 1 tablet (12.5 mg total) by mouth 2 (two) times daily with a meal. 180 tablet 3  . clopidogrel (PLAVIX) 75 MG tablet TAKE 1 TABLET BY MOUTH DAILY 90 tablet 3  . diazepam  (VALIUM) 10 MG tablet TAKE 0.5 TO 1 (ONE-HALF TO ONE) TABLET BY MOUTH EVERY 8 HOURS AS NEEDED FOR MUSCLE SPASM OR  ANXIETY 30 tablet 1  . diphenoxylate-atropine (LOMOTIL) 2.5-0.025 MG  tablet TAKE 2 TABLETS BY MOUTH FOUR TIMES A DAY AS NEEDED FOR DIARRHEA OR LOOSE STOOLS 30 tablet 3  . furosemide (LASIX) 20 MG tablet Take 20 mg by mouth daily as needed for fluid or edema.    . mexiletine (MEXITIL) 200 MG capsule Take 1 capsule (200 mg total) by mouth 2 (two) times daily. 60 capsule 11  . nitroGLYCERIN (NITROSTAT) 0.4 MG SL tablet DISSOLVE 1 TABLET UNDER THE TONGUE EVERY 5 MINUTES AS NEEDED FOR CHEST PAIN 25 tablet 4  . ondansetron (ZOFRAN) 4 MG tablet Take 1 tablet (4 mg total) by mouth every 8 (eight) hours as needed for nausea or vomiting. 20 tablet 0  . sacubitril-valsartan (ENTRESTO) 24-26 MG Take 1 tablet by mouth 2 (two) times daily. 60 tablet 6  . sildenafil (VIAGRA) 100 MG tablet Take 0.5-1 tablets (50-100 mg total) by mouth daily as needed for erectile dysfunction. 5 tablet 11  . bosutinib (BOSULIF) 500 MG tablet Take 1 tablet (500 mg total) daily with breakfast by mouth. Take with food. (Patient not taking: Reported on 03/23/2018) 30 tablet 0   No current facility-administered medications on file prior to visit.    Allergies  Allergen Reactions  . Norvasc [Amlodipine Besylate] Rash    rash  . Sulfa Antibiotics Hives    Hives    Social History   Socioeconomic History  . Marital status: Married    Spouse name: Not on file  . Number of children: 2  . Years of education: Not on file  . Highest education level: Not on file  Occupational History    Comment: retired Development worker, [employer]  Social Needs  . Financial resource strain: Not on file  . Food insecurity:    Worry: Not on file    Inability: Not on file  . Transportation needs:    Medical: Not on file    Non-medical: Not on file  Tobacco Use  . Smoking status: Former Smoker    Packs/day: 3.00    Years: 45.00    Pack years:  135.00    Types: Cigarettes    Last attempt to quit: 08/08/1997    Years since quitting: 20.6  . Smokeless tobacco: Former Network engineer and Sexual Activity  . Alcohol use: No    Alcohol/week: 0.0 standard drinks  . Drug use: No  . Sexual activity: Yes  Lifestyle  . Physical activity:    Days per week: Not on file    Minutes per session: Not on file  . Stress: Not on file  Relationships  . Social connections:    Talks on phone: Not on file    Gets together: Not on file    Attends religious service: Not on file    Active member of club or organization: Not on file    Attends meetings of clubs or organizations: Not on file    Relationship status: Not on file  . Intimate partner violence:    Fear of current or ex partner: Not on file    Emotionally abused: Not on file    Physically abused: Not on file    Forced sexual activity: Not on file  Other Topics Concern  . Not on file  Social History Narrative  . Not on file      Review of Systems  Gastrointestinal: Positive for diarrhea.  All other systems reviewed and are negative.      Objective:   Physical Exam  Constitutional: He appears well-developed and well-nourished.  Cardiovascular: Normal rate,  regular rhythm and normal heart sounds.  No murmur heard. Pulmonary/Chest: Effort normal and breath sounds normal. No respiratory distress. He has no wheezes. He has no rales.  Abdominal: Soft. Bowel sounds are normal. He exhibits no distension and no mass. There is no tenderness. There is no rebound and no guarding.  Vitals reviewed.         Assessment & Plan:  Heat exhaustion, initial encounter - Plan: CBC with Differential/Platelet, COMPLETE METABOLIC PANEL WITH GFR  Orthostatic hypotension  Cardiomyopathy, ischemic  Diarrhea, unspecified type I believe the patient has orthostatic hypotension due to multiple factors.  I have asked him to temporarily hold his Entresto in order for his blood pressure to improve.   I also believe he has an element of heat exhaustion.  I have recommended rest for the next 48 hours.  He is to sit inside in the air conditioner in a chair watching TV.  I want him drinking fluids such as Gatorade in an effort to improve his blood pressure and improve his hydration.  I would like to see him back Monday to see how he is doing.  If he starting to feel better, we may gradually resume his Entresto at that time due to his ischemic cardiomyopathy.  Certainly if his symptoms are worsening, I want him to go and seek medical attention immediately.  He needs to rest and take it easy.  He must pace himself and avoid working strenuously and excessive heat.  Chronic diarrhea, I will consult GI however also discovered that the patient is drinking a lemon, sugary beverage every day like Crystal light.  It is possible that this may be causing an osmotic diarrhea.  Therefore I recommended that he discontinue that as well.  I would like to rule out inflammatory bowel disease.  His sister does have a history of Crohn's disease.  I believe the patient needs endoscopy given the chronic nature of his diarrhea.  Given the fact he is been off his cancer treatment since April, I will also repeat a CBC along with a CMP to evaluate for prerenal azotemia and dehydration which is my leading suspicion.

## 2018-04-03 ENCOUNTER — Encounter: Payer: Self-pay | Admitting: Family Medicine

## 2018-04-03 ENCOUNTER — Ambulatory Visit (INDEPENDENT_AMBULATORY_CARE_PROVIDER_SITE_OTHER): Payer: Medicare HMO

## 2018-04-03 ENCOUNTER — Ambulatory Visit: Payer: Medicare HMO | Admitting: Podiatry

## 2018-04-03 ENCOUNTER — Encounter: Payer: Self-pay | Admitting: Podiatry

## 2018-04-03 VITALS — Temp 98.0°F

## 2018-04-03 DIAGNOSIS — M79675 Pain in left toe(s): Secondary | ICD-10-CM

## 2018-04-03 DIAGNOSIS — M779 Enthesopathy, unspecified: Secondary | ICD-10-CM | POA: Diagnosis not present

## 2018-04-03 NOTE — Progress Notes (Signed)
Subjective:    Patient ID: IVA POSTEN, male    DOB: [birth year], 72 y.o.   MRN: [id number]  HPI 72 year old male presents the office today for concerns of swelling and pain to the left big toe which is been ongoing for last several weeks.  He recently got his shot a couple weeks ago in the left heel and that pain resolved.  He states that the pain started the big toe joint after he was doing a lot of working he was bending over and on his knees quite a bit.  No specific injury that he can recall.  He states that in the morning she does not have significant swelling as the day goes on he gets more swelling.  There is some minimal redness as he gets swelling but no continual erythema.  No other concerns.   Review of Systems  All other systems reviewed and are negative.  Past Medical History:  Diagnosis Date  . AICD (automatic cardioverter/defibrillator) present   . Allergic rhinitis   . 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 Anxiety   . Blood dyscrasia    cmleukemia  . C. difficile colitis 06/16/2014  . CAD (coronary artery disease)    a. s/p 3v CABG 2010. b. NSTEMI 05/2014 in setting of VT. Cath impressions: "Recent IMI with occluded SVG to RCA. Has Right to Right collaterals and collaterals from septal and OM. EF 40%."  . Carotid disease, bilateral ([place])    a. Right CEA 2002; known occluded left carotid. b. Duplex 06/2014: known occluded LICA, stable 3-54% RICA s/p CEA.  . Chronic systolic CHF (congestive heart failure) ([place])   . 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 Depression   . Emphysema of lung ([place])   . HTN (hypertension)   . Hyperlipidemia   . Ischemic cardiomyopathy    a. Prior EF 36%. b. 2014: 50%. c. 05/2014: EF 35-40% by echo, 40% with inf HK by cath.  . NSTEMI (non-ST elevation myocardial infarction) ([place]) 05/2014  . Pleural effusion 05/2014  . Pneumonia 05/23/2014  . Presence of permanent cardiac pacemaker   . Prostate hypertrophy   . PVC's  (premature ventricular contractions)    a. PVC's with prior Holter showing PVC load of 22%.  . Shortness of breath dyspnea   . Stroke ([place]) 01  . TIA (transient ischemic attack)    ASPVD, S./P. right CEA, 2002, and known occluded left carotid  . Ventricular tachycardia ([place])    a. Admitted with VT 05/2013 - EPS with inducible sustained monomorphic VT; s/p Medtronic ICD 05/21/14.    Past Surgical History:  Procedure Laterality Date  . BACK SURGERY    . CAROTID ENDARTERECTOMY Right 01  . carotidectomy  2003   right side  . COLONOSCOPY  07/2010  . CORONARY ARTERY BYPASS GRAFT  02/2009   3 vessel  . ELECTROPHYSIOLOGIC STUDY  05/21/14  . ELECTROPHYSIOLOGY STUDY N/A 05/21/2014   Procedure: ELECTROPHYSIOLOGY STUDY;  Surgeon: Deboraha Sprang, MD;  Location: [facility] CATH LAB;  Service: Cardiovascular;  Laterality: N/A;  . EP IMPLANTABLE DEVICE  05/21/14   single chamber Metronic ICD  . LEFT HEART CATHETERIZATION WITH CORONARY/GRAFT ANGIOGRAM N/A 05/19/2014   Procedure: LEFT HEART CATHETERIZATION WITH Beatrix Fetters;  Surgeon: Josue Hector, MD;  Location: [facility] CATH LAB;  Service: Cardiovascular;  Laterality: N/A;  . PLEURAL BIOPSY N/A 11/27/2014   Procedure: PLEURAL BIOPSY;  Surgeon: [name], MD;  Location: [place];  Service: Thoracic;  Laterality:  N/A;  . PLEURAL EFFUSION DRAINAGE Right 11/27/2014   Procedure: DRAINAGE OF PLEURAL EFFUSION;  Surgeon: [name], MD;  Location: [place];  Service: Thoracic;  Laterality: Right;  . TALC PLEURODESIS Right 11/27/2014   Procedure: Pietro Cassis;  Surgeon: [name], MD;  Location: [place];  Service: Thoracic;  Laterality: Right;  Marland Kitchen VIDEO ASSISTED THORACOSCOPY Right 11/27/2014   Procedure: RIGHT VIDEO ASSISTED THORACOSCOPY;  Surgeon: [name], MD;  Location: [facility] OR;  Service: Thoracic;  Laterality: Right;     Current Outpatient Medications:  .  atorvastatin (LIPITOR) 80 MG tablet, TAKE ONE TABLET BY MOUTH  ONCE DAILY AT 6 PM., Disp: 90 tablet, Rfl: 4 .  bosutinib (BOSULIF) 500 MG tablet, Take 1 tablet (500 mg total) daily with breakfast by mouth. Take with food. (Patient not taking: Reported on 03/23/2018), Disp: 30 tablet, Rfl: 0 .  carvedilol (COREG) 12.5 MG tablet, Take 1 tablet (12.5 mg total) by mouth 2 (two) times daily with a meal., Disp: 180 tablet, Rfl: 3 .  clopidogrel (PLAVIX) 75 MG tablet, TAKE 1 TABLET BY MOUTH DAILY, Disp: 90 tablet, Rfl: 3 .  diazepam (VALIUM) 10 MG tablet, TAKE 0.5 TO 1 (ONE-HALF TO ONE) TABLET BY MOUTH EVERY 8 HOURS AS NEEDED FOR MUSCLE SPASM OR  ANXIETY, Disp: 30 tablet, Rfl: 1 .  diphenoxylate-atropine (LOMOTIL) 2.5-0.025 MG tablet, TAKE 2 TABLETS BY MOUTH FOUR TIMES A DAY AS NEEDED FOR DIARRHEA OR LOOSE STOOLS, Disp: 30 tablet, Rfl: 3 .  furosemide (LASIX) 20 MG tablet, Take 20 mg by mouth daily as needed for fluid or edema., Disp: , Rfl:  .  mexiletine (MEXITIL) 200 MG capsule, Take 1 capsule (200 mg total) by mouth 2 (two) times daily., Disp: 60 capsule, Rfl: 11 .  nitroGLYCERIN (NITROSTAT) 0.4 MG SL tablet, DISSOLVE 1 TABLET UNDER THE TONGUE EVERY 5 MINUTES AS NEEDED FOR CHEST PAIN, Disp: 25 tablet, Rfl: 4 .  ondansetron (ZOFRAN) 4 MG tablet, Take 1 tablet (4 mg total) by mouth every 8 (eight) hours as needed for nausea or vomiting., Disp: 20 tablet, Rfl: 0 .  sacubitril-valsartan (ENTRESTO) 24-26 MG, Take 1 tablet by mouth 2 (two) times daily., Disp: 60 tablet, Rfl: 6 .  sildenafil (VIAGRA) 100 MG tablet, Take 0.5-1 tablets (50-100 mg total) by mouth daily as needed for erectile dysfunction., Disp: 5 tablet, Rfl: 11  Allergies  Allergen Reactions  . Norvasc [Amlodipine Besylate] Rash    rash  . Sulfa Antibiotics Hives    Hives         Objective:   Physical Exam General: AAO x3, NAD  Dermatological: Skin is warm, dry and supple bilateral. Nails x 10 are well manicured; remaining integument appears unremarkable at this time. There are no open sores, no  preulcerative lesions, no rash or signs of infection present.  Vascular: Dorsalis Pedis artery and Posterior Tibial artery pedal pulses are 2/4 bilateral with immedate capillary fill time. There is no pain with calf compression, swelling, warmth, erythema.   Neruologic: Grossly intact via light touch bilateral. Vibratory intact via tuning fork bilateral. Protective threshold with Semmes Wienstein monofilament intact to all pedal sites bilateral.   Musculoskeletal: There is mild tenderness palpation of the first MTPJ and there is edema to this area there is no significant erythema there is no increase in warmth.  No ascending sialitis but there is no pain or crepitation with MPJ range of motion.  No other area of tenderness identified there is no pain in  the metatarsals.  No pain with ankle or heel today.  Muscular strength 5/5 in all groups tested bilateral.  Gait: Unassisted, Nonantalgic.      Assessment & Plan:  72 year old male capsulitis left first MPJ -Treatment options discussed including all alternatives, risks, and complications -Etiology of symptoms were discussed -X-rays were obtained and reviewed with the patient.  No definitive evidence of acute fracture or stress fracture identified today. -Steroid injection performed.  See procedure note below. -Discussed wearing a stiffer soled shoe. -He has minimal pain on exam today he says the pain is worse towards the end of the day.  I also dispensed a compression anklet awareness discussed ice and elevation.  Procedure: Injection small joint Discussed alternatives, risks, complications and verbal consent was obtained.  Location: Left 1st MTPJ Skin Prep: Betadine. Injectate: 0.5cc 0.5% marcaine plain, 0.5 cc 2% lidocaine plain and, 1 cc kenalog 10. Disposition: Patient tolerated procedure well. Injection site dressed with a band-aid.  Post-injection care was discussed and return precautions discussed.   Trula Slade DPM

## 2018-04-06 ENCOUNTER — Encounter: Payer: Self-pay | Admitting: Family Medicine

## 2018-04-06 ENCOUNTER — Ambulatory Visit (INDEPENDENT_AMBULATORY_CARE_PROVIDER_SITE_OTHER): Payer: Medicare HMO | Admitting: Family Medicine

## 2018-04-06 VITALS — BP 140/70 | HR 74 | Temp 98.2°F | Resp 18 | Ht 69.0 in | Wt 188.0 lb

## 2018-04-06 DIAGNOSIS — T675XXD Heat exhaustion, unspecified, subsequent encounter: Secondary | ICD-10-CM | POA: Diagnosis not present

## 2018-04-06 NOTE — Progress Notes (Signed)
Subjective:    Patient ID: Anthony Wolfe, male    DOB: 07-Dec-1945, 72 y.o.   MRN: [id number]  Diarrhea      11/2017 Up until last week, the patient was doing well.  Starting last week he developed a queasy feeling in his stomach.  He reported early satiety, no appetite, nausea, he has chronic diarrhea.  He denies fever.  He denies melena.  He denies hematochezia.  He denies any vomiting.  Symptoms lasted 3 or 4 days.  2 days ago, he took Pepto-Bismol.  After that, his symptoms subsided.  He continues to have chronic diarrhea likely related to his cancer medication but otherwise the GI symptoms have completely gone away.  At that time, my plan was: Exam is reassuring.  Symptoms have completely subsided.  Patient was concerned the medication valsartan was causing his symptoms.  He saw an ad on television stating that this medication can cause cancer and he was worried that he may have stomach cancer.  I reassured the patient that I am certain that the valsartan was not causing any of his symptoms.  I also tried to assuage his fears regarding the linkage between valsartan, the contaminant NDEA, and cancer.  His manufacturer has not been recalled.  I recommended that he remain on the medication given his underlying congestive heart failure especially since he cannot afford entresto.  For the time being and we will clinically monitor the patient.  If symptoms return, we will institute a more thorough GI workup.  At the present time I suspect that this may have just been a mild stomach virus or possibly related to his bosulif.   01/05/18 Patient is here today for a general checkup.  He states that his breathing is much improved since he resumed Entresto 24/26 p.o. twice daily.  He denies any lightheadedness.  He denies any presyncope.  He is due to recheck renal function and potassium on the medication.  He continues to have some mild dyspnea on exertion however this is much improved on the [place] compared  to without.  He is willing to pay the cost of the medication due to the benefit that he receives from it.  He denies any orthopnea.  He denies any paroxysmal nocturnal dyspnea.  He denies any arrhythmias or palpitations.  He denies any syncope.  He does have underlying COPD but he denies any cough, wheezing, or hemoptysis.  He too is due for a screening colonoscopy.  He is requesting that I schedule this for him.  At that time, my plan was: I would increase Entresto to 49/51 1 p.o. twice daily to optimize treatment for his congestive heart failure/ischemic cardiomyopathy.  Otherwise he is on appropriate medication for his history of coronary artery disease.  He is on an antiplatelet agent, Plavix.  He is on a beta-blocker carvedilol that is not exacerbating his COPD.  He is on Entresto as dictated above.  He is on high-dose statin, 80 mg of Lipitor.  I will check a fasting lipid panel.  Goal LDL cholesterol is less than 70.  His COPD is stable.  This is the best the patient has felt in quite some time.  Therefore I will go ahead and schedule GI consultation to arrange colonoscopy to update his general health preventative screening  01/16/18 Patient presents today complaining of feeling weak and lightheaded.  He feels extremely tired and dizzy.  Recently he has been working in his garden and whenever he he stands up  after bending over, he will feel lightheaded like he may pass out.  His blood pressure is extremely low at 80/42 today.  He is only noticed the symptoms over the last 3 to 4 days.  During that time, he is not eating as well.  Furthermore he has had increased diarrhea recently.  He has chronic diarrhea which has been attributed to his cancer medication in the past.  However the patient has independently discontinued the cancer medication in April and the diarrhea has not improved.  His wife and the patient are both concerned that he may have a recurrent bout of C. difficile colitis as he is experienced  this in the past.  He denies any chest pain shortness of breath or dyspnea on exertion.  At that time, my plan was: I believe the patient is experiencing severe orthostatic hypotension due to multiple factors including dehydration possibly from diarrhea, overmedication due to the recent increased dose of Entresto, and decreased oral intake over the last 3 to 4 days.  Therefore I will give the patient IV fluids slowly given his congestive heart failure.  He will be given 500 cc of normal saline slowly today in clinic.  I will check a CBC to rule out anemia.  I will check a CMP to rule out kidney failure or severe dehydration.  I will check a GI pathogen panel stool test to rule out infectious causes of diarrhea.  I will increase the patient's dose of Lomotil to 2 tablets 4 times a day to try to stop the diarrhea to calm the dehydration and I will temporarily discontinue his Delene Loll and then see the patient back in clinic on Thursday to see if he is feeling better.  If symptoms worsen he will need to go to the hospital  03/30/18 Patient symptoms improved after temporarily discontinuing Entresto.  We resumed his Entresto albeit at a lower dose.  He is currently taking 24/26 1 p.o. twice daily.  Patient independently discontinued his medication prescribed by his oncologist due to diarrhea.  He stopped taking the medication in April however his diarrhea has still not improved.  He reports yellow watery stool twice a day every day.  He previously canceled his appointment to see the gastroenterologist for his chronic diarrhea.  He denies any fevers or chills.  He denies any weight loss.  However the diarrhea could be contributing to his dehydration.  Also over the last 2 to 3 days has been doing an excessive amount of work in the heat.  The air conditioner went out in his home.  Therefore he independently put a 24,000 BTU window unit into his home.  This was extremely heavy.  His wife states that he could ring the  sweat out of his shirt.  Over the last 2 days he has been working in his garden out in the heat and also working on the air conditioner sweating excessively.  Due to the combination of sweat, heavy physical manual labor in the heat, and Entresto, his blood pressure today is relatively low at 90/58.  He reports orthostatic dizziness and weakness.  He denies any chest pain or shortness of breath or dyspnea on exertion.  He does report dizziness with standing and feeling lightheaded with standing. At that time, my plan was: I believe the patient has orthostatic hypotension due to multiple factors.  I have asked him to temporarily hold his Entresto in order for his blood pressure to improve.  I also believe he has an  element of heat exhaustion.  I have recommended rest for the next 48 hours.  He is to sit inside in the air conditioner in a chair watching TV.  I want him drinking fluids such as Gatorade in an effort to improve his blood pressure and improve his hydration.  I would like to see him back Monday to see how he is doing.  If he starting to feel better, we may gradually resume his Entresto at that time due to his ischemic cardiomyopathy.  Certainly if his symptoms are worsening, I want him to go and seek medical attention immediately.  He needs to rest and take it easy.  He must pace himself and avoid working strenuously and excessive heat.  Chronic diarrhea, I will consult GI however also discovered that the patient is drinking a lemon, sugary beverage every day like Crystal light.  It is possible that this may be causing an osmotic diarrhea.  Therefore I recommended that he discontinue that as well.  I would like to rule out inflammatory bowel disease.  His sister does have a history of Crohn's disease.  I believe the patient needs endoscopy given the chronic nature of his diarrhea.  Given the fact he is been off his cancer treatment since April, I will also repeat a CBC along with a CMP to evaluate for  prerenal azotemia and dehydration which is my leading suspicion.  04/06/18 Patient is here today to be reassessed.  He feels much better.  In fact 2 days ago the patient resumed his Entresto as his blood pressure had risen to 170/80 at home.  He is back to his baseline.  He denies any orthostatic dizziness.  He denies any fatigue.  He denies any lightheadedness.  He denies any chest pain or shortness of breath.  Having been on the [facility] now for 2 days, his blood pressure is returning towards normal at 140/70.  Also his diarrhea has improved since he discontinued his "lemon beverage".  Therefore he canceled his appointment to see gastroenterology Past Medical History:  Diagnosis Date  . AICD (automatic cardioverter/defibrillator) present   . Allergic rhinitis   . 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 Anxiety   . Blood dyscrasia    cmleukemia  . C. difficile colitis 06/16/2014  . CAD (coronary artery disease)    a. s/p 3v CABG 2010. b. NSTEMI 05/2014 in setting of VT. Cath impressions: "Recent IMI with occluded SVG to RCA. Has Right to Right collaterals and collaterals from septal and OM. EF 40%."  . Carotid disease, bilateral ([place])    a. Right CEA 2002; known occluded left carotid. b. Duplex 06/2014: known occluded LICA, stable 2-40% RICA s/p CEA.  . Chronic systolic CHF (congestive heart failure) ([place])   . 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 Depression   . Emphysema of lung ([place])   . HTN (hypertension)   . Hyperlipidemia   . Ischemic cardiomyopathy    a. Prior EF 36%. b. 2014: 50%. c. 05/2014: EF 35-40% by echo, 40% with inf HK by cath.  . NSTEMI (non-ST elevation myocardial infarction) ([place]) 05/2014  . Pleural effusion 05/2014  . Pneumonia 05/23/2014  . Presence of permanent cardiac pacemaker   . Prostate hypertrophy   . PVC's (premature ventricular contractions)    a. PVC's with prior Holter showing PVC load of 22%.  . Shortness of breath dyspnea   . Stroke  ([place]) 01  . TIA (transient ischemic attack)    ASPVD, S./P.  right CEA, 2002, and known occluded left carotid  . Ventricular tachycardia ([place])    a. Admitted with VT 05/2013 - EPS with inducible sustained monomorphic VT; s/p Medtronic ICD 05/21/14.   Past Surgical History:  Procedure Laterality Date  . BACK SURGERY    . CAROTID ENDARTERECTOMY Right 01  . carotidectomy  2003   right side  . COLONOSCOPY  07/2010  . CORONARY ARTERY BYPASS GRAFT  02/2009   3 vessel  . ELECTROPHYSIOLOGIC STUDY  05/21/14  . ELECTROPHYSIOLOGY STUDY N/A 05/21/2014   Procedure: ELECTROPHYSIOLOGY STUDY;  Surgeon: Deboraha Sprang, MD;  Location: [facility] CATH LAB;  Service: Cardiovascular;  Laterality: N/A;  . EP IMPLANTABLE DEVICE  05/21/14   single chamber Metronic ICD  . LEFT HEART CATHETERIZATION WITH CORONARY/GRAFT ANGIOGRAM N/A 05/19/2014   Procedure: LEFT HEART CATHETERIZATION WITH Beatrix Fetters;  Surgeon: Josue Hector, MD;  Location: [facility] CATH LAB;  Service: Cardiovascular;  Laterality: N/A;  . PLEURAL BIOPSY N/A 11/27/2014   Procedure: PLEURAL BIOPSY;  Surgeon: [name], MD;  Location: [place];  Service: Thoracic;  Laterality: N/A;  . PLEURAL EFFUSION DRAINAGE Right 11/27/2014   Procedure: DRAINAGE OF PLEURAL EFFUSION;  Surgeon: [name], MD;  Location: [place];  Service: Thoracic;  Laterality: Right;  . TALC PLEURODESIS Right 11/27/2014   Procedure: Pietro Cassis;  Surgeon: [name], MD;  Location: [place];  Service: Thoracic;  Laterality: Right;  Marland Kitchen VIDEO ASSISTED THORACOSCOPY Right 11/27/2014   Procedure: RIGHT VIDEO ASSISTED THORACOSCOPY;  Surgeon: [name], MD;  Location: [place];  Service: Thoracic;  Laterality: Right;   Current Outpatient Medications on File Prior to Visit  Medication Sig Dispense Refill  . atorvastatin (LIPITOR) 80 MG tablet TAKE ONE TABLET BY MOUTH ONCE DAILY AT 6 PM. 90 tablet 4  . carvedilol (COREG) 12.5 MG tablet Take 1 tablet (12.5  mg total) by mouth 2 (two) times daily with a meal. 180 tablet 3  . clopidogrel (PLAVIX) 75 MG tablet TAKE 1 TABLET BY MOUTH DAILY 90 tablet 3  . diazepam (VALIUM) 10 MG tablet TAKE 0.5 TO 1 (ONE-HALF TO ONE) TABLET BY MOUTH EVERY 8 HOURS AS NEEDED FOR MUSCLE SPASM OR  ANXIETY 30 tablet 1  . diphenoxylate-atropine (LOMOTIL) 2.5-0.025 MG tablet TAKE 2 TABLETS BY MOUTH FOUR TIMES A DAY AS NEEDED FOR DIARRHEA OR LOOSE STOOLS 30 tablet 3  . furosemide (LASIX) 20 MG tablet Take 20 mg by mouth daily as needed for fluid or edema.    . mexiletine (MEXITIL) 200 MG capsule Take 1 capsule (200 mg total) by mouth 2 (two) times daily. 60 capsule 11  . nitroGLYCERIN (NITROSTAT) 0.4 MG SL tablet DISSOLVE 1 TABLET UNDER THE TONGUE EVERY 5 MINUTES AS NEEDED FOR CHEST PAIN 25 tablet 4  . ondansetron (ZOFRAN) 4 MG tablet Take 1 tablet (4 mg total) by mouth every 8 (eight) hours as needed for nausea or vomiting. 20 tablet 0  . sacubitril-valsartan (ENTRESTO) 24-26 MG Take 1 tablet by mouth 2 (two) times daily. 60 tablet 6  . sildenafil (VIAGRA) 100 MG tablet Take 0.5-1 tablets (50-100 mg total) by mouth daily as needed for erectile dysfunction. 5 tablet 11  . bosutinib (BOSULIF) 500 MG tablet Take 1 tablet (500 mg total) daily with breakfast by mouth. Take with food. (Patient not taking: Reported on 03/23/2018) 30 tablet 0   No current facility-administered medications on file prior to visit.    Allergies  Allergen Reactions  . Norvasc [Amlodipine  Besylate] Rash    rash  . Sulfa Antibiotics Hives    Hives    Social History   Socioeconomic History  . Marital status: Married    Spouse name: Not on file  . Number of children: 2  . Years of education: Not on file  . Highest education level: Not on file  Occupational History    Comment: retired Development worker, [employer]  Social Needs  . Financial resource strain: Not on file  . Food insecurity:    Worry: Not on file    Inability: Not on file  . Transportation needs:     Medical: Not on file    Non-medical: Not on file  Tobacco Use  . Smoking status: Former Smoker    Packs/day: 3.00    Years: 45.00    Pack years: 135.00    Types: Cigarettes    Last attempt to quit: 08/08/1997    Years since quitting: 20.6  . Smokeless tobacco: Former Network engineer and Sexual Activity  . Alcohol use: No    Alcohol/week: 0.0 standard drinks  . Drug use: No  . Sexual activity: Yes  Lifestyle  . Physical activity:    Days per week: Not on file    Minutes per session: Not on file  . Stress: Not on file  Relationships  . Social connections:    Talks on phone: Not on file    Gets together: Not on file    Attends religious service: Not on file    Active member of club or organization: Not on file    Attends meetings of clubs or organizations: Not on file    Relationship status: Not on file  . Intimate partner violence:    Fear of current or ex partner: Not on file    Emotionally abused: Not on file    Physically abused: Not on file    Forced sexual activity: Not on file  Other Topics Concern  . Not on file  Social History Narrative  . Not on file      Review of Systems  Gastrointestinal: Positive for diarrhea.  All other systems reviewed and are negative.      Objective:   Physical Exam  Constitutional: He appears well-developed and well-nourished.  Cardiovascular: Normal rate, regular rhythm and normal heart sounds.  No murmur heard. Pulmonary/Chest: Effort normal and breath sounds normal. No respiratory distress. He has no wheezes. He has no rales.  Abdominal: Soft. Bowel sounds are normal. He exhibits no distension and no mass. There is no tenderness. There is no rebound and no guarding.  Vitals reviewed.         Assessment & Plan:  Heat exhaustion, subsequent encounter  Diarrhea has resolved.  Avoid that beverage in the future.  Patient does not appear dehydrated today.  Clinically his blood pressure is back to his baseline.  He is feeling  normal now.  Continue Entresto 24/26 p.o. twice daily.  Counseled the patient on ways to avoid heat exhaustion in the future such as working early in the morning, ensuring adequate hydration, taking frequent breaks, and pacing himself.

## 2018-04-12 ENCOUNTER — Other Ambulatory Visit: Payer: Self-pay | Admitting: Internal Medicine

## 2018-04-16 ENCOUNTER — Other Ambulatory Visit: Payer: Self-pay | Admitting: Family Medicine

## 2018-04-16 NOTE — Telephone Encounter (Signed)
Ok to refill??  Last office visit 04/06/2018.  Last refill 03/08/2018.

## 2018-04-17 ENCOUNTER — Ambulatory Visit (INDEPENDENT_AMBULATORY_CARE_PROVIDER_SITE_OTHER): Payer: Medicare HMO | Admitting: *Deleted

## 2018-04-17 DIAGNOSIS — I255 Ischemic cardiomyopathy: Secondary | ICD-10-CM

## 2018-04-17 NOTE — Progress Notes (Signed)
Remote ICD transmission.   

## 2018-05-09 LAB — CUP PACEART REMOTE DEVICE CHECK
Brady Statistic RV Percent Paced: 0.08 %
Date Time Interrogation Session: 20190910062823
HighPow Impedance: 56 Ohm
Lead Channel Impedance Value: 361 Ohm
Lead Channel Impedance Value: 418 Ohm
Lead Channel Pacing Threshold Pulse Width: 0.4 ms
Lead Channel Sensing Intrinsic Amplitude: 8 mV
Lead Channel Setting Pacing Amplitude: 2 V
Lead Channel Setting Pacing Pulse Width: 0.4 ms
Lead Channel Setting Sensing Sensitivity: 0.3 mV
MDC IDC LEAD IMPLANT DT: 20151014
MDC IDC LEAD LOCATION: 753860
MDC IDC MSMT BATTERY REMAINING LONGEVITY: 96 mo
MDC IDC MSMT BATTERY VOLTAGE: 3 V
MDC IDC MSMT LEADCHNL RV PACING THRESHOLD AMPLITUDE: 0.875 V
MDC IDC MSMT LEADCHNL RV SENSING INTR AMPL: 8 mV
MDC IDC PG IMPLANT DT: 20151014

## 2018-05-10 ENCOUNTER — Other Ambulatory Visit: Payer: Self-pay | Admitting: Internal Medicine

## 2018-05-31 ENCOUNTER — Ambulatory Visit (INDEPENDENT_AMBULATORY_CARE_PROVIDER_SITE_OTHER): Payer: Medicare HMO | Admitting: Family Medicine

## 2018-05-31 ENCOUNTER — Encounter: Payer: Self-pay | Admitting: Family Medicine

## 2018-05-31 VITALS — BP 156/74 | HR 78 | Temp 98.0°F | Resp 18 | Ht 69.0 in | Wt 190.0 lb

## 2018-05-31 DIAGNOSIS — J209 Acute bronchitis, unspecified: Secondary | ICD-10-CM

## 2018-05-31 DIAGNOSIS — J440 Chronic obstructive pulmonary disease with acute lower respiratory infection: Secondary | ICD-10-CM | POA: Diagnosis not present

## 2018-05-31 MED ORDER — AZITHROMYCIN 250 MG PO TABS: ORAL_TABLET | ORAL | 0 refills | Status: DC

## 2018-05-31 MED ORDER — PREDNISONE 20 MG PO TABS: ORAL_TABLET | ORAL | 0 refills | Status: DC

## 2018-05-31 NOTE — Progress Notes (Signed)
Subjective:    Patient ID: Anthony Wolfe, male    DOB: 05-04-1946, 72 y.o.   MRN: [id number]  Patient states that he has been coughing and sick for approximately 2 weeks.  It is steadily getting worse.  He reports increasing chest congestion.  He reports occasional wheezing.  He reports cough productive of clear to yellow sputum.  He denies any fevers but he does report increasing shortness of breath.  There is no pitting edema in his extremities.  Lungs are clear except some faint right basilar crackles which are due to his chronic right-sided pleural effusion.  He does have some faint expiratory wheezes and he also has underlying history of COPD. Past Medical History:  Diagnosis Date  . AICD (automatic cardioverter/defibrillator) present   . Allergic rhinitis   . 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 Anxiety   . Blood dyscrasia    cmleukemia  . C. difficile colitis 06/16/2014  . CAD (coronary artery disease)    a. s/p 3v CABG 2010. b. NSTEMI 05/2014 in setting of VT. Cath impressions: "Recent IMI with occluded SVG to RCA. Has Right to Right collaterals and collaterals from septal and OM. EF 40%."  . Carotid disease, bilateral ([place])    a. Right CEA 2002; known occluded left carotid. b. Duplex 06/2014: known occluded LICA, stable 3-29% RICA s/p CEA.  . Chronic systolic CHF (congestive heart failure) ([place])   . 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 Depression   . Emphysema of lung ([place])   . HTN (hypertension)   . Hyperlipidemia   . Ischemic cardiomyopathy    a. Prior EF 36%. b. 2014: 50%. c. 05/2014: EF 35-40% by echo, 40% with inf HK by cath.  . NSTEMI (non-ST elevation myocardial infarction) ([place]) 05/2014  . Pleural effusion 05/2014  . Pneumonia 05/23/2014  . Presence of permanent cardiac pacemaker   . Prostate hypertrophy   . PVC's (premature ventricular contractions)    a. PVC's with prior Holter showing PVC load of 22%.  . Shortness of breath dyspnea   .  Stroke ([place]) 01  . TIA (transient ischemic attack)    ASPVD, S./P. right CEA, 2002, and known occluded left carotid  . Ventricular tachycardia ([place])    a. Admitted with VT 05/2013 - EPS with inducible sustained monomorphic VT; s/p Medtronic ICD 05/21/14.   Past Surgical History:  Procedure Laterality Date  . BACK SURGERY    . CAROTID ENDARTERECTOMY Right 01  . carotidectomy  2003   right side  . COLONOSCOPY  07/2010  . CORONARY ARTERY BYPASS GRAFT  02/2009   3 vessel  . ELECTROPHYSIOLOGIC STUDY  05/21/14  . ELECTROPHYSIOLOGY STUDY N/A 05/21/2014   Procedure: ELECTROPHYSIOLOGY STUDY;  Surgeon: Deboraha Sprang, MD;  Location: [facility] CATH LAB;  Service: Cardiovascular;  Laterality: N/A;  . EP IMPLANTABLE DEVICE  05/21/14   single chamber Metronic ICD  . LEFT HEART CATHETERIZATION WITH CORONARY/GRAFT ANGIOGRAM N/A 05/19/2014   Procedure: LEFT HEART CATHETERIZATION WITH Beatrix Fetters;  Surgeon: Josue Hector, MD;  Location: [facility] CATH LAB;  Service: Cardiovascular;  Laterality: N/A;  . PLEURAL BIOPSY N/A 11/27/2014   Procedure: PLEURAL BIOPSY;  Surgeon: [name], MD;  Location: [place];  Service: Thoracic;  Laterality: N/A;  . PLEURAL EFFUSION DRAINAGE Right 11/27/2014   Procedure: DRAINAGE OF PLEURAL EFFUSION;  Surgeon: [name], MD;  Location: [place];  Service: Thoracic;  Laterality: Right;  . TALC PLEURODESIS Right 11/27/2014   Procedure:  TALC PLEURADESIS;  Surgeon: [name], MD;  Location: [place];  Service: Thoracic;  Laterality: Right;  Marland Kitchen VIDEO ASSISTED THORACOSCOPY Right 11/27/2014   Procedure: RIGHT VIDEO ASSISTED THORACOSCOPY;  Surgeon: [name], MD;  Location: [place];  Service: Thoracic;  Laterality: Right;   Current Outpatient Medications on File Prior to Visit  Medication Sig Dispense Refill  . atorvastatin (LIPITOR) 80 MG tablet TAKE ONE TABLET BY MOUTH ONCE DAILY AT 6 PM. 90 tablet 4  . carvedilol (COREG) 12.5 MG tablet Take 1 tablet  (12.5 mg total) by mouth 2 (two) times daily with a meal. 180 tablet 3  . clopidogrel (PLAVIX) 75 MG tablet TAKE 1 TABLET BY MOUTH DAILY 90 tablet 3  . diazepam (VALIUM) 10 MG tablet TAKE 1/2 TO 1 (ONE-HALF TO ONE) TABLET BY MOUTH EVERY 8 HOURS AS NEEDED FOR MUSCLE SPASM FOR ANXIETY 30 tablet 1  . diphenoxylate-atropine (LOMOTIL) 2.5-0.025 MG tablet TAKE 2 TABLETS BY MOUTH FOUR TIMES A DAY AS NEEDED FOR DIARRHEA OR LOOSE STOOLS 30 tablet 3  . furosemide (LASIX) 20 MG tablet Take 20 mg by mouth daily as needed for fluid or edema.    . mexiletine (MEXITIL) 200 MG capsule Take 1 capsule (200 mg total) by mouth 2 (two) times daily. Please schedule appt for anymore refills. 1st attempt. 60 capsule 0  . nitroGLYCERIN (NITROSTAT) 0.4 MG SL tablet DISSOLVE 1 TABLET UNDER THE TONGUE EVERY 5 MINUTES AS NEEDED FOR CHEST PAIN 25 tablet 4  . ondansetron (ZOFRAN) 4 MG tablet Take 1 tablet (4 mg total) by mouth every 8 (eight) hours as needed for nausea or vomiting. 20 tablet 0  . sildenafil (VIAGRA) 100 MG tablet Take 0.5-1 tablets (50-100 mg total) by mouth daily as needed for erectile dysfunction. 5 tablet 11  . valsartan (DIOVAN) 160 MG tablet TAKE 1 TABLET BY MOUTH ONCE DAILY DISCONTIUE ENTRESTO  3   No current facility-administered medications on file prior to visit.    Allergies  Allergen Reactions  . Norvasc [Amlodipine Besylate] Rash    rash  . Sulfa Antibiotics Hives    Hives    Social History   Socioeconomic History  . Marital status: Married    Spouse name: Not on file  . Number of children: 2  . Years of education: Not on file  . Highest education level: Not on file  Occupational History    Comment: retired Development worker, international aid  Social Needs  . Financial resource strain: Not on file  . Food insecurity:    Worry: Not on file    Inability: Not on file  . Transportation needs:    Medical: Not on file    Non-medical: Not on file  Tobacco Use  . Smoking status: Former Smoker    Packs/day:  3.00    Years: 45.00    Pack years: 135.00    Types: Cigarettes    Last attempt to quit: 08/08/1997    Years since quitting: 20.8  . Smokeless tobacco: Former Network engineer and Sexual Activity  . Alcohol use: No    Alcohol/week: 0.0 standard drinks  . Drug use: No  . Sexual activity: Yes  Lifestyle  . Physical activity:    Days per week: Not on file    Minutes per session: Not on file  . Stress: Not on file  Relationships  . Social connections:    Talks on phone: Not on file    Gets together: Not on file    Attends  religious service: Not on file    Active member of club or organization: Not on file    Attends meetings of clubs or organizations: Not on file    Relationship status: Not on file  . Intimate partner violence:    Fear of current or ex partner: Not on file    Emotionally abused: Not on file    Physically abused: Not on file    Forced sexual activity: Not on file  Other Topics Concern  . Not on file  Social History Narrative  . Not on file      Review of Systems  Gastrointestinal: Positive for diarrhea.  All other systems reviewed and are negative.      Objective:   Physical Exam  Constitutional: He appears well-developed and well-nourished.  Cardiovascular: Normal rate, regular rhythm and normal heart sounds.  No murmur heard. Pulmonary/Chest: Effort normal. No respiratory distress. He has wheezes in the right upper field, the right lower field, the left upper field and the left lower field. He has rales in the right lower field.      Abdominal: Soft. Bowel sounds are normal. He exhibits no distension and no mass. There is no tenderness. There is no rebound and no guarding.  Vitals reviewed.         Assessment & Plan:  I believe the patient has bronchitis bordering on a COPD exacerbation.  I will treat the patient with a prednisone taper pack coupled with a Z-Pak and reassess in 1 week or sooner if worsening

## 2018-06-13 ENCOUNTER — Inpatient Hospital Stay: Payer: Medicare HMO | Attending: Hematology and Oncology

## 2018-06-13 DIAGNOSIS — C9210 Chronic myeloid leukemia, BCR/ABL-positive, not having achieved remission: Secondary | ICD-10-CM | POA: Diagnosis not present

## 2018-06-13 LAB — CBC WITH DIFFERENTIAL/PLATELET
Abs Immature Granulocytes: 0.06 10*3/uL (ref 0.00–0.07)
BASOS ABS: 0 10*3/uL (ref 0.0–0.1)
Basophils Relative: 0 %
EOS PCT: 2 %
Eosinophils Absolute: 0.1 10*3/uL (ref 0.0–0.5)
HCT: 37.6 % — ABNORMAL LOW (ref 39.0–52.0)
HEMOGLOBIN: 11.9 g/dL — AB (ref 13.0–17.0)
Immature Granulocytes: 1 %
LYMPHS PCT: 14 %
Lymphs Abs: 1 10*3/uL (ref 0.7–4.0)
MCH: 31.7 pg (ref 26.0–34.0)
MCHC: 31.6 g/dL (ref 30.0–36.0)
MCV: 100.3 fL — ABNORMAL HIGH (ref 80.0–100.0)
Monocytes Absolute: 0.5 10*3/uL (ref 0.1–1.0)
Monocytes Relative: 8 %
NEUTROS ABS: 5.4 10*3/uL (ref 1.7–7.7)
NEUTROS PCT: 75 %
NRBC: 0 % (ref 0.0–0.2)
Platelets: 144 10*3/uL — ABNORMAL LOW (ref 150–400)
RBC: 3.75 MIL/uL — AB (ref 4.22–5.81)
RDW: 13.3 % (ref 11.5–15.5)
WBC: 7.1 10*3/uL (ref 4.0–10.5)

## 2018-06-13 LAB — COMPREHENSIVE METABOLIC PANEL
ALBUMIN: 3 g/dL — AB (ref 3.5–5.0)
ALT: 16 U/L (ref 0–44)
ANION GAP: 7 (ref 5–15)
AST: 12 U/L — ABNORMAL LOW (ref 15–41)
Alkaline Phosphatase: 75 U/L (ref 38–126)
BUN: 10 mg/dL (ref 8–23)
CHLORIDE: 110 mmol/L (ref 98–111)
CO2: 28 mmol/L (ref 22–32)
Calcium: 8.6 mg/dL — ABNORMAL LOW (ref 8.9–10.3)
Creatinine, Ser: 0.99 mg/dL (ref 0.61–1.24)
GFR calc non Af Amer: >60 mL/min (ref >60)
GLUCOSE: 96 mg/dL (ref 70–99)
Potassium: 4 mmol/L (ref 3.5–5.1)
SODIUM: 145 mmol/L (ref 135–145)
Total Bilirubin: 0.4 mg/dL (ref 0.3–1.2)
Total Protein: 5.8 g/dL — ABNORMAL LOW (ref 6.5–8.1)

## 2018-06-19 ENCOUNTER — Telehealth: Payer: Self-pay | Admitting: Hematology and Oncology

## 2018-06-19 NOTE — Telephone Encounter (Signed)
NG out of office 10/30 thru 12/2 - moved f/u to [place]. Spoke with patient re change.

## 2018-06-20 ENCOUNTER — Telehealth: Payer: Self-pay

## 2018-06-20 LAB — BCR/ABL

## 2018-06-20 NOTE — Telephone Encounter (Signed)
Called per Dr. Audelia Hives and given BCR/ABL1 results. He verbalized understanding.

## 2018-06-22 ENCOUNTER — Ambulatory Visit (INDEPENDENT_AMBULATORY_CARE_PROVIDER_SITE_OTHER): Payer: Medicare HMO

## 2018-06-22 DIAGNOSIS — Z23 Encounter for immunization: Secondary | ICD-10-CM

## 2018-06-22 NOTE — Progress Notes (Signed)
Patient came in today to receive annual flu vaccine. High dose Fluzone injection was given in the left deltoid. He tolerated well. VIS given

## 2018-06-26 ENCOUNTER — Inpatient Hospital Stay: Payer: Medicare HMO | Admitting: Hematology and Oncology

## 2018-06-29 ENCOUNTER — Encounter

## 2018-06-29 ENCOUNTER — Ambulatory Visit: Payer: Medicare HMO | Admitting: Gastroenterology

## 2018-07-01 ENCOUNTER — Other Ambulatory Visit: Payer: Self-pay | Admitting: Family Medicine

## 2018-07-01 ENCOUNTER — Other Ambulatory Visit: Payer: Self-pay | Admitting: Physician Assistant

## 2018-07-06 ENCOUNTER — Other Ambulatory Visit: Payer: Self-pay | Admitting: Hematology and Oncology

## 2018-07-06 DIAGNOSIS — C9210 Chronic myeloid leukemia, BCR/ABL-positive, not having achieved remission: Secondary | ICD-10-CM

## 2018-07-17 ENCOUNTER — Telehealth: Payer: Self-pay | Admitting: Cardiology

## 2018-07-17 ENCOUNTER — Other Ambulatory Visit: Payer: Self-pay | Admitting: *Deleted

## 2018-07-17 MED ORDER — DIAZEPAM 10 MG PO TABS: ORAL_TABLET | ORAL | 1 refills | Status: DC

## 2018-07-17 NOTE — Telephone Encounter (Signed)
Received fax requesting refill on Diazepam.   Ok to refill??  Last office visit 05/31/2018.  Last refill 04/16/2018, #1 refill.

## 2018-07-17 NOTE — Telephone Encounter (Signed)
Pt wife answered the phone and stated that pt did not want to talk to yaw and she proceed to disconnect the call.

## 2018-07-20 ENCOUNTER — Encounter: Payer: Self-pay | Admitting: Cardiology

## 2018-08-06 ENCOUNTER — Other Ambulatory Visit: Payer: Self-pay | Admitting: Physician Assistant

## 2018-08-22 ENCOUNTER — Other Ambulatory Visit: Payer: Self-pay | Admitting: Physician Assistant

## 2018-08-28 ENCOUNTER — Other Ambulatory Visit: Payer: Self-pay | Admitting: Physician Assistant

## 2018-09-06 ENCOUNTER — Inpatient Hospital Stay: Payer: Medicare HMO | Attending: Hematology and Oncology

## 2018-09-06 DIAGNOSIS — D729 Disorder of white blood cells, unspecified: Secondary | ICD-10-CM | POA: Diagnosis not present

## 2018-09-06 DIAGNOSIS — C9210 Chronic myeloid leukemia, BCR/ABL-positive, not having achieved remission: Secondary | ICD-10-CM | POA: Diagnosis not present

## 2018-09-06 DIAGNOSIS — K521 Toxic gastroenteritis and colitis: Secondary | ICD-10-CM | POA: Diagnosis not present

## 2018-09-06 DIAGNOSIS — D6481 Anemia due to antineoplastic chemotherapy: Secondary | ICD-10-CM | POA: Diagnosis not present

## 2018-09-06 DIAGNOSIS — I255 Ischemic cardiomyopathy: Secondary | ICD-10-CM | POA: Insufficient documentation

## 2018-09-06 LAB — COMPREHENSIVE METABOLIC PANEL
ALK PHOS: 94 U/L (ref 38–126)
ALT: 20 U/L (ref 0–44)
AST: 16 U/L (ref 15–41)
Albumin: 3.5 g/dL (ref 3.5–5.0)
Anion gap: 7 (ref 5–15)
BUN: 15 mg/dL (ref 8–23)
CALCIUM: 8.8 mg/dL — AB (ref 8.9–10.3)
CO2: 29 mmol/L (ref 22–32)
Chloride: 108 mmol/L (ref 98–111)
Creatinine, Ser: 1.05 mg/dL (ref 0.61–1.24)
GFR calc Af Amer: >60 mL/min (ref >60)
GFR calc non Af Amer: >60 mL/min (ref >60)
Glucose, Bld: 88 mg/dL (ref 70–99)
Potassium: 4.3 mmol/L (ref 3.5–5.1)
Sodium: 144 mmol/L (ref 135–145)
Total Bilirubin: 0.4 mg/dL (ref 0.3–1.2)
Total Protein: 6.5 g/dL (ref 6.5–8.1)

## 2018-09-06 LAB — CBC WITH DIFFERENTIAL/PLATELET
Abs Immature Granulocytes: 0.02 10*3/uL (ref 0.00–0.07)
Basophils Absolute: 0.1 10*3/uL (ref 0.0–0.1)
Basophils Relative: 1 %
Eosinophils Absolute: 0.1 10*3/uL (ref 0.0–0.5)
Eosinophils Relative: 2 %
HCT: 40.2 % (ref 39.0–52.0)
HEMOGLOBIN: 12.8 g/dL — AB (ref 13.0–17.0)
Immature Granulocytes: 0 %
LYMPHS ABS: 1 10*3/uL (ref 0.7–4.0)
Lymphocytes Relative: 18 %
MCH: 30.5 pg (ref 26.0–34.0)
MCHC: 31.8 g/dL (ref 30.0–36.0)
MCV: 95.7 fL (ref 80.0–100.0)
Monocytes Absolute: 0.5 10*3/uL (ref 0.1–1.0)
Monocytes Relative: 8 %
Neutro Abs: 4.1 10*3/uL (ref 1.7–7.7)
Neutrophils Relative %: 71 %
Platelets: 179 10*3/uL (ref 150–400)
RBC: 4.2 MIL/uL — ABNORMAL LOW (ref 4.22–5.81)
RDW: 13.5 % (ref 11.5–15.5)
WBC: 5.7 10*3/uL (ref 4.0–10.5)
nRBC: 0 % (ref 0.0–0.2)

## 2018-09-07 ENCOUNTER — Ambulatory Visit: Payer: Medicare HMO | Admitting: Internal Medicine

## 2018-09-07 ENCOUNTER — Encounter: Payer: Self-pay | Admitting: Internal Medicine

## 2018-09-07 VITALS — BP 136/82 | HR 66 | Ht 69.0 in | Wt 187.2 lb

## 2018-09-07 DIAGNOSIS — I472 Ventricular tachycardia, unspecified: Secondary | ICD-10-CM

## 2018-09-07 DIAGNOSIS — I5022 Chronic systolic (congestive) heart failure: Secondary | ICD-10-CM | POA: Diagnosis not present

## 2018-09-07 DIAGNOSIS — Z95810 Presence of automatic (implantable) cardiac defibrillator: Secondary | ICD-10-CM | POA: Diagnosis not present

## 2018-09-07 DIAGNOSIS — I255 Ischemic cardiomyopathy: Secondary | ICD-10-CM | POA: Diagnosis not present

## 2018-09-07 MED ORDER — PROPRANOLOL HCL ER 60 MG PO CP24: 60.0000 mg | ORAL_CAPSULE | Freq: Every day | ORAL | 3 refills | Status: DC

## 2018-09-07 MED ORDER — RANOLAZINE ER 500 MG PO TB12: 500.0000 mg | ORAL_TABLET | Freq: Two times a day (BID) | ORAL | 3 refills | Status: DC

## 2018-09-07 NOTE — Progress Notes (Signed)
.      Patient Care Team: [name], MD as PCP - General (Family Medicine) [name]., MD as Attending Physician (Cardiology) [name], MD (Family Medicine) [name], MD (Cardiothoracic Surgery) Early, Arvilla Meres, MD as Attending Physician (Vascular Surgery) [name], MD as Consulting Physician (Hematology and Oncology) Deboraha Sprang, MD as Consulting Physician (Cardiology)   HPI  Anthony Wolfe is a 73 y.o. male Seen in follow-up for an ICD implanted for secondary prevention after he presented with ventricular tachycardia 10/15. Catheterization demonstrated occluded vein graft to his RCA ejection fraction was about 40%  He is status post bypass surgery 2010  He underwent EP testing and was found to be inducible.  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   He underwent a thoracentesis initially in 11/15 and redo 12/15.  He saw pulm 1/16 with question as to whether immunotherpay could be explanation of effusion;  He had pleurodesis.   DATE TEST    9/14    Myoview   EF ?? No ischemia  3/171 Carotids R ICA 40-59%//L ICA- T   10/17    Echo   EF 45-50 % LAE 46/2.26/35        Seen by PA and WC A few weeks ago. Multiple episodes of nonsustained ventricular tachycardia and sVT were noted on the device.  One lasted > 6 hrs and amiodarone was initiated. He was unable to tolerate he had ventricular tachycardia again in September 2018 and mexiletine was initiated.  He has had one asymptomatic episode of treated ventricular tachycardia November 2018.  He is on aspirin and Plavix.  He has a history of remote TIA in addition to his coronary disease.  Insurance company has rejected support for mexiletine.  No intercurrent ventricular tachycardia.  No chest pain, shortness of breath peripheral edema nocturnal dyspnea or orthopnea  Date Cr K Mg Hgb  5/18  1.1 3.7       11/18 0.9 3.9 2.3 11.5  1/20 1.05 4.3  12.8     Past Medical History:  Diagnosis Date  . AICD (automatic cardioverter/defibrillator) present   . Allergic rhinitis   . 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 Anxiety   . Blood dyscrasia    cmleukemia  . C. difficile colitis 06/16/2014  . CAD (coronary artery disease)    a. s/p 3v CABG 2010. b. NSTEMI 05/2014 in setting of VT. Cath impressions: "Recent IMI with occluded SVG to RCA. Has Right to Right collaterals and collaterals from septal and OM. EF 40%."  . Carotid disease, bilateral ([place])    a. Right CEA 2002; known occluded left carotid. b. Duplex 06/2014: known occluded LICA, stable 1-93% RICA s/p CEA.  . Chronic systolic CHF (congestive heart failure) ([place])   . 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 Depression   . Emphysema of lung ([place])   . HTN (hypertension)   . Hyperlipidemia   . Ischemic cardiomyopathy    a. Prior EF 36%. b. 2014: 50%. c. 05/2014: EF 35-40% by echo, 40% with inf HK by cath.  . NSTEMI (non-ST elevation myocardial infarction) ([place]) 05/2014  . Pleural effusion 05/2014  . Pneumonia 05/23/2014  . Presence of permanent cardiac pacemaker   . Prostate hypertrophy   . PVC's (premature ventricular contractions)    a. PVC's with prior Holter showing PVC load of 22%.  . Shortness of breath dyspnea   . Stroke (  [place]) 01  . TIA (transient ischemic attack)    ASPVD, S./P. right CEA, 2002, and known occluded left carotid  . Ventricular tachycardia ([place])    a. Admitted with VT 05/2013 - EPS with inducible sustained monomorphic VT; s/p Medtronic ICD 05/21/14.    Past Surgical History:  Procedure Laterality Date  . BACK SURGERY    . CAROTID ENDARTERECTOMY Right 01  . carotidectomy  2003   right side  . COLONOSCOPY  07/2010  . CORONARY ARTERY BYPASS GRAFT  02/2009   3 vessel  . ELECTROPHYSIOLOGIC STUDY  05/21/14  . ELECTROPHYSIOLOGY STUDY N/A 05/21/2014   Procedure: ELECTROPHYSIOLOGY STUDY;   Surgeon: Deboraha Sprang, MD;  Location: [facility] CATH LAB;  Service: Cardiovascular;  Laterality: N/A;  . EP IMPLANTABLE DEVICE  05/21/14   single chamber Metronic ICD  . LEFT HEART CATHETERIZATION WITH CORONARY/GRAFT ANGIOGRAM N/A 05/19/2014   Procedure: LEFT HEART CATHETERIZATION WITH Beatrix Fetters;  Surgeon: Josue Hector, MD;  Location: [facility] CATH LAB;  Service: Cardiovascular;  Laterality: N/A;  . PLEURAL BIOPSY N/A 11/27/2014   Procedure: PLEURAL BIOPSY;  Surgeon: [name], MD;  Location: [place];  Service: Thoracic;  Laterality: N/A;  . PLEURAL EFFUSION DRAINAGE Right 11/27/2014   Procedure: DRAINAGE OF PLEURAL EFFUSION;  Surgeon: [name], MD;  Location: [place];  Service: Thoracic;  Laterality: Right;  . TALC PLEURODESIS Right 11/27/2014   Procedure: Pietro Cassis;  Surgeon: [name], MD;  Location: [place];  Service: Thoracic;  Laterality: Right;  Marland Kitchen VIDEO ASSISTED THORACOSCOPY Right 11/27/2014   Procedure: RIGHT VIDEO ASSISTED THORACOSCOPY;  Surgeon: [name], MD;  Location: [place];  Service: Thoracic;  Laterality: Right;    Current Outpatient Medications  Medication Sig Dispense Refill  . atorvastatin (LIPITOR) 80 MG tablet TAKE ONE TABLET BY MOUTH ONCE DAILY AT 6 PM. 90 tablet 4  . carvedilol (COREG) 12.5 MG tablet Take 1 tablet (12.5 mg total) by mouth 2 (two) times daily with a meal. Please make annual appt for future refills. [phone] 1st attempt 60 tablet 0  . clopidogrel (PLAVIX) 75 MG tablet TAKE 1 TABLET BY MOUTH DAILY 90 tablet 3  . diazepam (VALIUM) 10 MG tablet TAKE 1/2 TO 1 (ONE-HALF TO ONE) TABLET BY MOUTH EVERY 8 HOURS AS NEEDED FOR MUSCLE SPASM FOR ANXIETY 30 tablet 1  . mexiletine (MEXITIL) 200 MG capsule Take 1 capsule (200 mg total) by mouth 2 (two) times daily. Please make overdue appt with Dr. Caryl Comes. 3rd and Final Attempt 30 capsule 0  . nitroGLYCERIN (NITROSTAT) 0.4 MG SL tablet DISSOLVE 1 TABLET UNDER THE TONGUE  EVERY 5 MINUTES AS NEEDED FOR CHEST PAIN 25 tablet 4  . sildenafil (VIAGRA) 100 MG tablet Take 0.5-1 tablets (50-100 mg total) by mouth daily as needed for erectile dysfunction. 5 tablet 11  . valsartan (DIOVAN) 160 MG tablet TAKE 1 TABLET BY MOUTH ONCE DAILY DISCONTIUE ENTRESTO  3   No current facility-administered medications for this visit.     Allergies  Allergen Reactions  . Norvasc [Amlodipine Besylate] Rash    rash  . Sulfa Antibiotics Hives    Hives     Review of Systems negative except from HPI and PMH  Physical Exam BP 136/82   Pulse 66   Ht 5\' 9"  (1.753 m)   Wt 187 lb 3.2 oz (84.9 kg)   SpO2 98%   BMI 27.64 kg/m  Well developed and nourished in no acute distress HENT normal Neck supple  with JVP-flat Clear Regular rate and rhythm, no murmurs or gallops Abd-soft with active BS No Clubbing cyanosis edema Skin-warm and dry A & Oriented  Grossly normal sensory and motor function      ECG sinus @ 66 23/12/42 IMI  Assessment and  Plan  Ventricular tachycardia   Syncope - no recurrent   ischemic cardiomyopathy depressed LV function and prior bypass  Implantable defibrillator-Medtronic  Carotid Stenosis  Known L occlusion    TIA    Insurance companies are mandating the discontinuation of mexiletine.  I have recommended amiodarone which he has not tolerated, and flecainide/propafenone which are contraindicated in the context of his coronary artery disease.  We will try switching from carvedilol to Inderal because of the late acting sodium channel blockade as well as adjunctive ranolazine.  In the event that he has recurrent monomorphic ventricular tachycardia we will need to consider catheter ablation  Without symptoms of ischemia Euvolemic continue current meds

## 2018-09-07 NOTE — Patient Instructions (Signed)
Medication Instructions:  Your physician has recommended you make the following change in your medication:   1. Stop Carvedilol 2. Stop Mexilitine 3. Begin Inderall LA 60mg  tablet, once daily 4. Begin Ranolizine 500mg  tablte, twice daily  Labwork: None ordered.  Testing/Procedures: None ordered.  Follow-Up: Your physician recommends that you schedule a follow-up appointment in:   6 months with Chanetta Marshall, NP  Any Other Special Instructions Will Be Listed Below (If Applicable).     If you need a refill on your cardiac medications before your next appointment, please call your pharmacy.

## 2018-09-10 ENCOUNTER — Telehealth: Payer: Self-pay

## 2018-09-10 LAB — CUP PACEART INCLINIC DEVICE CHECK
Date Time Interrogation Session: 20200203083427
Implantable Lead Implant Date: 20151014
Implantable Lead Location: 753860
Implantable Pulse Generator Implant Date: 20151014

## 2018-09-10 NOTE — Telephone Encounter (Signed)
Pt states he was put on a new medication but the co pay was 110 dollars so he wants to be put back mexiletine 200 mg BID. Pt does not know the name of the new med. Please address thank you.

## 2018-09-13 MED ORDER — CARVEDILOL 12.5 MG PO TABS: 12.5000 mg | ORAL_TABLET | Freq: Two times a day (BID) | ORAL | 3 refills | Status: DC

## 2018-09-13 MED ORDER — MEXILETINE HCL 200 MG PO CAPS: 200.0000 mg | ORAL_CAPSULE | Freq: Two times a day (BID) | ORAL | 3 refills | Status: DC

## 2018-09-13 NOTE — Telephone Encounter (Signed)
Spoke with pt and he would like to switch back to his Mexiletine and carvedilol. Pt understands his Ranolazine and inderal were discontinued. Pt has verbalized understanding and had no additional questions.

## 2018-09-13 NOTE — Addendum Note (Signed)
Addended by: Dollene Primrose on: 09/13/2018 09:58 AM   Modules accepted: Orders

## 2018-09-19 LAB — BCR/ABL

## 2018-09-20 ENCOUNTER — Encounter: Payer: Self-pay | Admitting: Hematology and Oncology

## 2018-09-20 ENCOUNTER — Inpatient Hospital Stay: Payer: Medicare HMO | Attending: Hematology and Oncology | Admitting: Hematology and Oncology

## 2018-09-20 VITALS — BP 154/78 | HR 72 | Temp 98.6°F | Resp 18 | Ht 69.0 in | Wt 189.4 lb

## 2018-09-20 DIAGNOSIS — C9210 Chronic myeloid leukemia, BCR/ABL-positive, not having achieved remission: Secondary | ICD-10-CM | POA: Insufficient documentation

## 2018-09-20 DIAGNOSIS — D649 Anemia, unspecified: Secondary | ICD-10-CM

## 2018-09-20 DIAGNOSIS — D6481 Anemia due to antineoplastic chemotherapy: Secondary | ICD-10-CM | POA: Insufficient documentation

## 2018-09-20 DIAGNOSIS — I255 Ischemic cardiomyopathy: Secondary | ICD-10-CM | POA: Diagnosis not present

## 2018-09-20 MED ORDER — IMATINIB MESYLATE 400 MG PO TABS: 400.0000 mg | ORAL_TABLET | Freq: Every day | ORAL | 12 refills | Status: DC

## 2018-09-21 ENCOUNTER — Encounter: Payer: Self-pay | Admitting: Family Medicine

## 2018-09-21 ENCOUNTER — Telehealth: Payer: Self-pay

## 2018-09-21 ENCOUNTER — Telehealth: Payer: Self-pay | Admitting: Pharmacist

## 2018-09-21 ENCOUNTER — Telehealth: Payer: Self-pay | Admitting: Hematology and Oncology

## 2018-09-21 ENCOUNTER — Ambulatory Visit (INDEPENDENT_AMBULATORY_CARE_PROVIDER_SITE_OTHER): Payer: Medicare HMO | Admitting: Family Medicine

## 2018-09-21 ENCOUNTER — Encounter: Payer: Self-pay | Admitting: Hematology and Oncology

## 2018-09-21 ENCOUNTER — Other Ambulatory Visit: Payer: Self-pay | Admitting: Hematology and Oncology

## 2018-09-21 VITALS — BP 160/98 | HR 70 | Temp 97.7°F | Resp 16 | Ht 69.0 in | Wt 190.0 lb

## 2018-09-21 DIAGNOSIS — I509 Heart failure, unspecified: Secondary | ICD-10-CM

## 2018-09-21 DIAGNOSIS — C9210 Chronic myeloid leukemia, BCR/ABL-positive, not having achieved remission: Secondary | ICD-10-CM

## 2018-09-21 DIAGNOSIS — M722 Plantar fascial fibromatosis: Secondary | ICD-10-CM

## 2018-09-21 DIAGNOSIS — I6521 Occlusion and stenosis of right carotid artery: Secondary | ICD-10-CM

## 2018-09-21 DIAGNOSIS — I429 Cardiomyopathy, unspecified: Secondary | ICD-10-CM

## 2018-09-21 DIAGNOSIS — D649 Anemia, unspecified: Secondary | ICD-10-CM | POA: Insufficient documentation

## 2018-09-21 MED ORDER — NILOTINIB HCL 150 MG PO CAPS: 300.0000 mg | ORAL_CAPSULE | Freq: Two times a day (BID) | ORAL | 10 refills | Status: DC

## 2018-09-21 NOTE — Progress Notes (Signed)
[place] OFFICE PROGRESS NOTE  Patient Care Team: [name], MD as PCP - General (Family Medicine) [name]., MD as Attending Physician (Cardiology) [name], MD (Family Medicine) [name], MD (Cardiothoracic Surgery) Early, Arvilla Meres, MD as Attending Physician (Vascular Surgery) [name], MD as Consulting Physician (Hematology and Oncology) Deboraha Sprang, MD as Consulting Physician (Cardiology)  ASSESSMENT & PLAN:  Chronic myelogenous leukemia Unfortunately, the patient have early signs of CML relapse He is not symptomatic I have reviewed the toxicities and drug interaction with the pharmacist Previously, he did not tolerate dasatinib due to pleural effusion With bosutinib, he has significant diarrhea and there were concerns about cardiotoxicity Ultimately, I think Tasigna would be a good choice for him We will get baseline EKG before we start I will order reduced dose treatment at 300 mg twice a day for him Once we start him on treatment, I will see him within 7 to 10 days for toxicity review  Cardiomyopathy, ischemic Clinically, he does not have signs of volume overload. He will continue medical management.  Chronic anemia This is likely anemia of chronic disease. The patient denies recent history of bleeding such as epistaxis, hematuria or hematochezia. He is asymptomatic from the anemia. We will observe for now.  He does not require transfusion now. I do not recommend any further work-up at this time.     No orders of the defined types were placed in this encounter.   INTERVAL HISTORY: Please see below for problem oriented charting. He returns with his wife for further follow-up Since discontinuation of bosutinib, he feels well Denies diarrhea His quality of life is good Denies recent chest pain, shortness of breath or angina.  SUMMARY OF ONCOLOGIC HISTORY:   Chronic myelogenous leukemia ([place])   05/04/2012 Bone  Marrow Biopsy    BM confirmed diagnosis of CML in Chronic phase. BCR/ABL by PCR detected abnormalitis with b2a2 & b3a2 subtypes    05/09/2012 - 03/16/2015 Chemotherapy    He was started on treatment with Dasatinib 100 mg daily    06/06/2012 Adverse Reaction    Dose of medication was reduced to 50 mg daily due to pancytopenia    01/24/2013 Progression    Patient was noted to have elevated blood count which and was subsequently found to be noncompliant to treatment. The patient has not been on treatment for several months because his prescription ran out. He was restarted back on treatment    01/16/2014 Progression    Bcr/ABL by PCR is worse. Dose of Dasatinib was increased to 100 mg.    02/26/2014 Tumor Marker    Blood work for ABL kinase mutation was negative.    10/29/2014 Tumor Marker    BCR/ABL b2a2 & b3a2 0.29%, IS 0.1624%, not in MMR yet but improving    11/05/2014 Adverse Reaction    He had thoracentesis due to pleural effusion    11/27/2014 Surgery    He underwent right video-assisted thoracoscopy, Drainage of pleural effusion, Pleural biopsy, Diaphragm biopsy, Lung biopsy & Talc pleurodesis    01/30/2015 Tumor Marker    BCR/ABL b2a2 & b3a2 0.78%, IS 0.4368%, not in MMR     03/11/2015 Pathology Results    BCR/ABL b2a2 0.19%, IS 0.1064%, not in MMR     03/16/2015 Adverse Reaction    Dasatinib was stopped due to recurrent pleural effusion    03/28/2015 - 11/06/2017 Chemotherapy    He started on Bosutinib    03/30/2015  Procedure    He has therapeutic thoracentesis of the right lung with 1 liter of fluid removed    03/30/2015 Adverse Reaction    Bosutinib is placed on hold, to be restart on 8/25 at 250 mg due to severe diarrhea    04/28/2015 Tumor Marker    BCR/ABL b2a2 0.06%, IS 0.0336%, in MMR     06/10/2015 Tumor Marker    BCR/ABL b2a2 0.14%, IS 0.1162%, not in MMR     08/14/2015 Pathology Results    BCR/ABL b2a2 0.02%, IS 0.0166%, In MMR     11/09/2015 Tumor Marker    BCR/ABL  b2a2 0.005%, IS 0.0042%, In MMR     02/08/2016 Tumor Marker    BCR/ABL undetectable. In MMR    06/13/2016 Tumor Marker    BCR/ABL undetectable. In MMR    12/06/2016 Pathology Results    BCR/ABL undetectable. In MMR    06/08/2017 Pathology Results    BCR/ABL undetectable. In MMR    09/08/2017 Pathology Results    BCR/ABL undetectable. In MMR    12/11/2017 Pathology Results    BCR/ABL undetectable. In MMR    03/19/2018 Pathology Results    BCR/ABL undetectable. In MMR    06/12/2018 Pathology Results    BCR/ABL undetectable. In MMR    09/06/2018 Pathology Results    BCR ABL is detectable. e13a2 (b2a2) and K82S6(O1V6) IS: 0.009%     REVIEW OF SYSTEMS:   Constitutional: Denies fevers, chills or abnormal weight loss Eyes: Denies blurriness of vision Ears, nose, mouth, throat, and face: Denies mucositis or sore throat Respiratory: Denies cough, dyspnea or wheezes Cardiovascular: Denies palpitation, chest discomfort or lower extremity swelling Gastrointestinal:  Denies nausea, heartburn or change in bowel habits Skin: Denies abnormal skin rashes Lymphatics: Denies new lymphadenopathy or easy bruising Neurological:Denies numbness, tingling or new weaknesses Behavioral/Psych: Mood is stable, no new changes  All other systems were reviewed with the patient and are negative.  I have reviewed the past medical history, past surgical history, social history and family history with the patient and they are unchanged from previous note.  ALLERGIES:  is allergic to norvasc [amlodipine besylate] and sulfa antibiotics.  MEDICATIONS:  Current Outpatient Medications  Medication Sig Dispense Refill  . atorvastatin (LIPITOR) 80 MG tablet TAKE ONE TABLET BY MOUTH ONCE DAILY AT 6 PM. 90 tablet 4  . carvedilol (COREG) 12.5 MG tablet Take 1 tablet (12.5 mg total) by mouth 2 (two) times daily. 180 tablet 3  . clopidogrel (PLAVIX) 75 MG tablet TAKE 1 TABLET BY MOUTH DAILY 90 tablet 3  . diazepam (VALIUM)  10 MG tablet TAKE 1/2 TO 1 (ONE-HALF TO ONE) TABLET BY MOUTH EVERY 8 HOURS AS NEEDED FOR MUSCLE SPASM FOR ANXIETY 30 tablet 1  . mexiletine (MEXITIL) 200 MG capsule Take 1 capsule (200 mg total) by mouth 2 (two) times daily. 180 capsule 3  . nilotinib (TASIGNA) 150 MG capsule Take 2 capsules (300 mg total) by mouth every 12 (twelve) hours. Take on an empty stomach, 1 hr before or 2 hrs after food. 120 capsule 10  . nitroGLYCERIN (NITROSTAT) 0.4 MG SL tablet DISSOLVE 1 TABLET UNDER THE TONGUE EVERY 5 MINUTES AS NEEDED FOR CHEST PAIN 25 tablet 4  . sildenafil (VIAGRA) 100 MG tablet Take 0.5-1 tablets (50-100 mg total) by mouth daily as needed for erectile dysfunction. 5 tablet 11  . valsartan (DIOVAN) 160 MG tablet TAKE 1 TABLET BY MOUTH ONCE DAILY DISCONTIUE ENTRESTO  3   No current facility-administered medications  for this visit.     PHYSICAL EXAMINATION: ECOG PERFORMANCE STATUS: 1 - Symptomatic but completely ambulatory  Vitals:   09/20/18 1154  BP: (!) 154/78  Pulse: 72  Resp: 18  Temp: 98.6 F (37 C)  SpO2: 97%   Filed Weights   09/20/18 1154  Weight: 189 lb 6.4 oz (85.9 kg)    GENERAL:alert, no distress and comfortable SKIN: skin color, texture, turgor are normal, no rashes or significant lesions EYES: normal, Conjunctiva are pink and non-injected, sclera clear OROPHARYNX:no exudate, no erythema and lips, buccal mucosa, and tongue normal  NECK: supple, thyroid normal size, non-tender, without nodularity LYMPH:  no palpable lymphadenopathy in the cervical, axillary or inguinal LUNGS: clear to auscultation and percussion with normal breathing effort HEART: regular rate & rhythm and no murmurs and no lower extremity edema ABDOMEN:abdomen soft, non-tender and normal bowel sounds Musculoskeletal:no cyanosis of digits and no clubbing  NEURO: alert & oriented x 3 with fluent speech, no focal motor/sensory deficits  LABORATORY DATA:  I have reviewed the data as listed     Component Value Date/Time   NA 144 09/06/2018 1055   NA 142 06/08/2017 0832   K 4.3 09/06/2018 1055   K 3.9 06/08/2017 0832   CL 108 09/06/2018 1055   CL 110 (H) 11/14/2012 0918   CO2 29 09/06/2018 1055   CO2 25 06/08/2017 0832   GLUCOSE 88 09/06/2018 1055   GLUCOSE 98 06/08/2017 0832   GLUCOSE 108 (H) 11/14/2012 0918   BUN 15 09/06/2018 1055   BUN 11.6 06/08/2017 0832   CREATININE 1.05 09/06/2018 1055   CREATININE 1.31 (H) 03/30/2018 1218   CREATININE 0.9 06/08/2017 0832   CALCIUM 8.8 (L) 09/06/2018 1055   CALCIUM 8.6 06/08/2017 0832   PROT 6.5 09/06/2018 1055   PROT 6.7 06/08/2017 0832   ALBUMIN 3.5 09/06/2018 1055   ALBUMIN 3.4 (L) 06/08/2017 0832   AST 16 09/06/2018 1055   AST 13 06/08/2017 0832   ALT 20 09/06/2018 1055   ALT 7 06/08/2017 0832   ALKPHOS 94 09/06/2018 1055   ALKPHOS 96 06/08/2017 0832   BILITOT 0.4 09/06/2018 1055   BILITOT 0.44 06/08/2017 0832   GFRNONAA >60 09/06/2018 1055   GFRNONAA 53 (L) 03/30/2018 1218   GFRAA >60 09/06/2018 1055   GFRAA 62 03/30/2018 1218    No results found for: SPEP, UPEP  Lab Results  Component Value Date   WBC 5.7 09/06/2018   NEUTROABS 4.1 09/06/2018   HGB 12.8 (L) 09/06/2018   HCT 40.2 09/06/2018   MCV 95.7 09/06/2018   PLT 179 09/06/2018      Chemistry      Component Value Date/Time   NA 144 09/06/2018 1055   NA 142 06/08/2017 0832   K 4.3 09/06/2018 1055   K 3.9 06/08/2017 0832   CL 108 09/06/2018 1055   CL 110 (H) 11/14/2012 0918   CO2 29 09/06/2018 1055   CO2 25 06/08/2017 0832   BUN 15 09/06/2018 1055   BUN 11.6 06/08/2017 0832   CREATININE 1.05 09/06/2018 1055   CREATININE 1.31 (H) 03/30/2018 1218   CREATININE 0.9 06/08/2017 0832      Component Value Date/Time   CALCIUM 8.8 (L) 09/06/2018 1055   CALCIUM 8.6 06/08/2017 0832   ALKPHOS 94 09/06/2018 1055   ALKPHOS 96 06/08/2017 0832   AST 16 09/06/2018 1055   AST 13 06/08/2017 0832   ALT 20 09/06/2018 1055   ALT 7 06/08/2017 0832    BILITOT  0.4 09/06/2018 1055   BILITOT 0.44 06/08/2017 9675      All questions were answered. The patient knows to call the clinic with any problems, questions or concerns. No barriers to learning was detected.  I spent 25 minutes counseling the patient face to face. The total time spent in the appointment was 30 minutes and more than 50% was on counseling and review of test results  [name], MD 09/21/2018 1:03 PM

## 2018-09-21 NOTE — Telephone Encounter (Signed)
Oral Oncology Pharmacist Encounter  Received new prescription for Gleevec (imatinib) for the treatment of relapsed chronic myeloid leukemia, planned duration until disease progression or unacceptable toxicity.  Original diagnosis in September 2013. Patient started treatment with dasatinib from October 2013 through August 2016. Course was complicated by pancytopenia, and issues with fluid retention, necessitating dose reductions, then dose increases as control of disease was lost. Dasatinib was eventually discontinued due to recurrent pleural effusions. Patient started on treatment with bosutinib in August 2016, treatment course has been complicated by severe diarrhea requiring multiple agents for control.  Labs from 09/06/2018 assessed, OK for treatment.  Noted patient with heart failure and multiple cardiac issues. Last ECHO performed 06/07/2016 shows LVEF 45-50% Recommend ECHO repeat prior to initiation of Gleevec and at signs of cardiovascular dysfunction or fluid retention  EKG performed 09/07/18 shows QTc 472msec  Current medication list in Epic reviewed, DDIs with Gleevec identified:  Gleevec and carvedilol: category C interaction: Gleevec is an inhibitor of CYP2D6 thereby leading to possible decreased metabolism and increase systemic exposure to the carvedilol.  BPs and HR's in Epic reviewed, most readings for BP above normal limits, readings for HR's WNL.  Vital signs will be monitored during treatment.  No change to current therapy is indicated at this time.  Gleevec and Lipitor: Category C interaction: Gleevec is an inhibitor of CYP3A4 thereby leading to possible decreased metabolism and increase systemic exposure to the atorvastatin.  Patient currently on atorvastatin 80 mg once daily, this is the highest dose of atorvastatin available.  Lipid panel reviewed, LDL within goal range.  Patient will be counseled about increase in side effects due to increase Lipitor exposure.  This will  continue to be monitored.  No change to current therapy is indicated at this time.  Gleevec and mexiletine: category C interaction: Gleevec is an inhibitor of CYP2D6 thereby leading to possible decreased metabolism and increase systemic exposure to the mexiletine.  This is a class Ib antiarrhythmic agent, max daily dose 1.2 g, patient currently on 200 mg twice daily.  Last EKG reviewed, monitored at regular intervals by patient's cardiologist.  Medication interaction will be discussed with MD.  Prescription has been e-scribed to the [facility] on 09/20/18 by MD for benefits analysis and approval.  Oral Oncology Clinic will continue to follow for insurance authorization, copayment issues, initial counseling and start date.  Johny Drilling, PharmD, BCPS, BCOP  09/21/2018 8:53 AM Oral Oncology Clinic [phone]

## 2018-09-21 NOTE — Telephone Encounter (Signed)
Oral Oncology Patient Advocate Encounter  Prior Authorization for Anthony Wolfe has been approved.    PA# [id number] Effective dates: 09/21/18 through 08/08/19  Oral Oncology Clinic will continue to follow.   [name] Phone [phone] Fax [phone]

## 2018-09-21 NOTE — Telephone Encounter (Signed)
Oral Oncology Patient Advocate Encounter  Prior Authorization for [place] has been approved.    PA# [id number] Effective dates: 09/21/18 through 08/08/19  Oral Oncology Clinic will continue to follow.   [place] [name] Phone [phone] Fax [phone]

## 2018-09-21 NOTE — Telephone Encounter (Signed)
Oral Chemotherapy Pharmacist Encounter   I spoke with patient's wife, Joaquim Lai, for overview of: Tasigna (nilotinib) for the treatment of chronic myeloid leukemia, planned duration until disease progression or unacceptable drug toxicity.   Counseled on administration, dosing, side effects, monitoring, drug-food interactions, safe handling, storage, and disposal.  Patient will take Tasigna 150mg  capsules, 2 capsules (300mg ) by mouth twice daily, with doses taken ~12 hours apart.   Patient will take Tasigna on an empty stomach, at least 1 hour before or 2 hours after meals with a glass of water.   Patient knows to avoid grapefruit and grapefruit juice while on therapy with Tasigna.  Tasigna start date: 09/26/2018   Adverse effects include but are not limited to: fatigue, night sweats, nausea, vomiting, diarrhea, rash, arthralgias, altered cardiac conduction, and decreased blood counts.    Discussed with Mrs. Frankie medication interaction with Lipitor. They will alert the office if patient starts to experience myalgias once starting on Tasigna, which may lead to increased exposure to Lipitor.  They will alert the office of any change in breathing status as patient experienced multiple pleural effusions with Srycel. Mrs. Hammerschmidt informed EKG will be performed and electrolytes will be closely monitored at Tasigna initiation.  Reviewed with patient importance of keeping a medication schedule and plan for any missed doses.   Mrs. Ebel voiced understanding and appreciation.    All questions answered. Medication reconciliation performed and medication/allergy list updated.  Tasigna will be filled immediately with the use of manufacturer voucher that will fully cover the cost of 4 weeks of Tasigna. Medication needs to be ordered. It will ship to patient's home on Monday, 09/24/2018, for delivery to patient's home on Tuesday. Patient will start Tasigna on Wednesday, 09/26/2018.  Oral oncology  patient advocate will assist patient in completing manufacturer application for compassionate use as well as surveil for foundation copayment grants to cover the cost of Tasigna.   They know to call the office with questions or concerns. Oral Oncology Clinic will continue to follow.  Johny Drilling, PharmD, BCPS, BCOP  09/21/2018  1:18 PM Oral Oncology Clinic [phone]

## 2018-09-21 NOTE — Telephone Encounter (Signed)
Oral Oncology Patient Advocate Encounter  Received notification from [facility] that prior authorization for Tasigna is required.  PA submitted on CoverMyMeds Key [id number] Status is pending  Oral Oncology Clinic will continue to follow.  [place] Patient [place] Phone [phone] Fax [phone]

## 2018-09-21 NOTE — Progress Notes (Signed)
Subjective:    Patient ID: Anthony Wolfe, male    DOB: 1945/09/20, 73 y.o.   MRN: [id number] Patient reports severe pain in his right heel near the insertion of the plantar fascia on the plantar surface of the calcaneus.  He is tender to palpation there and there is a palpable bone spur.  The patient states it hurts like someone stabbing him with a knife when he first gets out of bed in the morning.  There is sharp pain in his heel every time he walks.  He denies any falls or injuries.  There is no erythema or swelling.  He also is overdue for repeat carotid Doppler to monitor his right carotid stenosis.  Last carotid Doppler was in 2018 Past Medical History:  Diagnosis Date  . AICD (automatic cardioverter/defibrillator) present   . Allergic rhinitis   . 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 Anxiety   . Blood dyscrasia    cmleukemia  . C. difficile colitis 06/16/2014  . CAD (coronary artery disease)    a. s/p 3v CABG 2010. b. NSTEMI 05/2014 in setting of VT. Cath impressions: "Recent IMI with occluded SVG to RCA. Has Right to Right collaterals and collaterals from septal and OM. EF 40%."  . Carotid disease, bilateral ([place])    a. Right CEA 2002; known occluded left carotid. b. Duplex 06/2014: known occluded LICA, stable 1-27% RICA s/p CEA.  . Chronic systolic CHF (congestive heart failure) ([place])   . CML (chronic myelocytic leukemia) ([place]) 05/09/2012  . Depression   . Emphysema of lung ([place])   . HTN (hypertension)   . Hyperlipidemia   . Ischemic cardiomyopathy    a. Prior EF 36%. b. 2014: 50%. c. 05/2014: EF 35-40% by echo, 40% with inf HK by cath.  . NSTEMI (non-ST elevation myocardial infarction) ([place]) 05/2014  . Pleural effusion 05/2014  . Pneumonia 05/23/2014  . Presence of permanent cardiac pacemaker   . Prostate hypertrophy   . PVC's (premature ventricular contractions)    a. PVC's with prior Holter showing PVC load of 22%.  . Shortness of breath dyspnea   . Stroke ([place]) 01    . TIA (transient ischemic attack)    ASPVD, S./P. right CEA, 2002, and known occluded left carotid  . Ventricular tachycardia ([place])    a. Admitted with VT 05/2013 - EPS with inducible sustained monomorphic VT; s/p Medtronic ICD 05/21/14.   Past Surgical History:  Procedure Laterality Date  . BACK SURGERY    . CAROTID ENDARTERECTOMY Right 01  . carotidectomy  2003   right side  . COLONOSCOPY  07/2010  . CORONARY ARTERY BYPASS GRAFT  02/2009   3 vessel  . ELECTROPHYSIOLOGIC STUDY  05/21/14  . ELECTROPHYSIOLOGY STUDY N/A 05/21/2014   Procedure: ELECTROPHYSIOLOGY STUDY;  Surgeon: Deboraha Sprang, MD;  Location: [facility] CATH LAB;  Service: Cardiovascular;  Laterality: N/A;  . EP IMPLANTABLE DEVICE  05/21/14   single chamber Metronic ICD  . LEFT HEART CATHETERIZATION WITH CORONARY/GRAFT ANGIOGRAM N/A 05/19/2014   Procedure: LEFT HEART CATHETERIZATION WITH Beatrix Fetters;  Surgeon: Josue Hector, MD;  Location: [facility] CATH LAB;  Service: Cardiovascular;  Laterality: N/A;  . PLEURAL BIOPSY N/A 11/27/2014   Procedure: PLEURAL BIOPSY;  Surgeon: [name], MD;  Location: [place];  Service: Thoracic;  Laterality: N/A;  . PLEURAL EFFUSION DRAINAGE Right 11/27/2014   Procedure: DRAINAGE OF PLEURAL EFFUSION;  Surgeon: [name], MD;  Location: [facility];  Service: Thoracic;  Laterality:  Right;  Marland Kitchen TALC PLEURODESIS Right 11/27/2014   Procedure: Pietro Cassis;  Surgeon: [name], MD;  Location: [place];  Service: Thoracic;  Laterality: Right;  Marland Kitchen VIDEO ASSISTED THORACOSCOPY Right 11/27/2014   Procedure: RIGHT VIDEO ASSISTED THORACOSCOPY;  Surgeon: [name], MD;  Location: [facility];  Service: Thoracic;  Laterality: Right;   Current Outpatient Medications on File Prior to Visit  Medication Sig Dispense Refill  . atorvastatin (LIPITOR) 80 MG tablet TAKE ONE TABLET BY MOUTH ONCE DAILY AT 6 PM. 90 tablet 4  . carvedilol (COREG) 12.5 MG tablet Take 1 tablet (12.5 mg total)  by mouth 2 (two) times daily. 180 tablet 3  . clopidogrel (PLAVIX) 75 MG tablet TAKE 1 TABLET BY MOUTH DAILY 90 tablet 3  . diazepam (VALIUM) 10 MG tablet TAKE 1/2 TO 1 (ONE-HALF TO ONE) TABLET BY MOUTH EVERY 8 HOURS AS NEEDED FOR MUSCLE SPASM FOR ANXIETY 30 tablet 1  . imatinib (GLEEVEC) 400 MG tablet Take 1 tablet (400 mg total) by mouth daily. Take with meals and large glass of water.Caution:Chemotherapy 30 tablet 12  . mexiletine (MEXITIL) 200 MG capsule Take 1 capsule (200 mg total) by mouth 2 (two) times daily. 180 capsule 3  . nitroGLYCERIN (NITROSTAT) 0.4 MG SL tablet DISSOLVE 1 TABLET UNDER THE TONGUE EVERY 5 MINUTES AS NEEDED FOR CHEST PAIN 25 tablet 4  . sildenafil (VIAGRA) 100 MG tablet Take 0.5-1 tablets (50-100 mg total) by mouth daily as needed for erectile dysfunction. 5 tablet 11  . valsartan (DIOVAN) 160 MG tablet TAKE 1 TABLET BY MOUTH ONCE DAILY DISCONTIUE ENTRESTO  3   No current facility-administered medications on file prior to visit.    Allergies  Allergen Reactions  . Norvasc [Amlodipine Besylate] Rash    rash  . Sulfa Antibiotics Hives    Hives    Social History   Socioeconomic History  . Marital status: Married    Spouse name: Not on file  . Number of children: 2  . Years of education: Not on file  . Highest education level: Not on file  Occupational History    Comment: retired Development worker, international aid  Social Needs  . Financial resource strain: Not on file  . Food insecurity:    Worry: Not on file    Inability: Not on file  . Transportation needs:    Medical: Not on file    Non-medical: Not on file  Tobacco Use  . Smoking status: Former Smoker    Packs/day: 3.00    Years: 45.00    Pack years: 135.00    Types: Cigarettes    Last attempt to quit: 08/08/1997    Years since quitting: 21.1  . Smokeless tobacco: Former Network engineer and Sexual Activity  . Alcohol use: No    Alcohol/week: 0.0 standard drinks  . Drug use: No  . Sexual activity: Yes  Lifestyle    . Physical activity:    Days per week: Not on file    Minutes per session: Not on file  . Stress: Not on file  Relationships  . Social connections:    Talks on phone: Not on file    Gets together: Not on file    Attends religious service: Not on file    Active member of club or organization: Not on file    Attends meetings of clubs or organizations: Not on file    Relationship status: Not on file  . Intimate partner violence:    Fear of current  or ex partner: Not on file    Emotionally abused: Not on file    Physically abused: Not on file    Forced sexual activity: Not on file  Other Topics Concern  . Not on file  Social History Narrative  . Not on file      Review of Systems  Gastrointestinal: Positive for diarrhea.  All other systems reviewed and are negative.      Objective:   Physical Exam  Constitutional: He appears well-developed and well-nourished.  Cardiovascular: Normal rate, regular rhythm and normal heart sounds.  No murmur heard. Pulmonary/Chest: Effort normal and breath sounds normal. No respiratory distress. He has no wheezes. He has no rales.  Abdominal: Soft. Bowel sounds are normal.  Musculoskeletal:     Right foot: Tenderness and bony tenderness present.       Feet:  Vitals reviewed.         Assessment & Plan:  Carotid stenosis, asymptomatic, right - Plan: US Carotid Duplex Bilateral  Plantar fasciitis, right Patient has plantar fasciitis.  Using sterile technique, I injected the area of maximum tenderness on the her aspect of his right heel marked with an X on the diagram with 1 cc of lidocaine and 1 cc of 40 mg/mL Kenalog.  Patient tolerated procedure well without complication.  I will schedule the patient for carotid Dopplers.  His blood pressure is elevated because he just a country ham.  I recommended him to avoid such high sodium containing foods given his history of congestive heart failure

## 2018-09-21 NOTE — Telephone Encounter (Signed)
No los °

## 2018-09-21 NOTE — Assessment & Plan Note (Signed)
Clinically, he does not have signs of volume overload. He will continue medical management.

## 2018-09-21 NOTE — Telephone Encounter (Signed)
Oral Oncology Patient Advocate Encounter  Received notification from [facility] that prior authorization for [place] is required.  PA submitted on CoverMyMeds Key [id number] Status is pending  Oral Oncology Clinic will continue to follow.  [place] Patient [place] Phone [phone] Fax [phone]

## 2018-09-21 NOTE — Assessment & Plan Note (Signed)
This is likely anemia of chronic disease. The patient denies recent history of bleeding such as epistaxis, hematuria or hematochezia. He is asymptomatic from the anemia. We will observe for now.  He does not require transfusion now. I do not recommend any further work-up at this time.   

## 2018-09-21 NOTE — Telephone Encounter (Signed)
Scheduled appt per 2/14 sch message - pt is aware of appt date and time   

## 2018-09-21 NOTE — Assessment & Plan Note (Signed)
Unfortunately, the patient have early signs of CML relapse He is not symptomatic I have reviewed the toxicities and drug interaction with the pharmacist Previously, he did not tolerate dasatinib due to pleural effusion With bosutinib, he has significant diarrhea and there were concerns about cardiotoxicity Ultimately, I think Tasigna would be a good choice for him We will get baseline EKG before we start I will order reduced dose treatment at 300 mg twice a day for him Once we start him on treatment, I will see him within 7 to 10 days for toxicity review

## 2018-09-21 NOTE — Telephone Encounter (Signed)
Oral Oncology Pharmacist Encounter  After discussion with MD Rae Lips will be initiated instead of [place]. This is updated in a separate encounter.  Johny Drilling, PharmD, BCPS, BCOP  09/21/2018 1:45 PM Oral Oncology Clinic [phone]

## 2018-09-21 NOTE — Telephone Encounter (Signed)
Oral Oncology Patient Advocate Encounter  Tasigna copay is [phone]. There are no grant funds available for his disease state at this time. However, I can assist the patient in applying for manufacturer assistance through [employer]. If approved the patient will receive his Tasigna through the contracted pharmacy and mailed directly to his home free of charge.  For the first fill of Tasigna we will use a one time voucher to receive a months worth of medicine free of charge from [facility].   I spoke to the patients wife, Joaquim Lai about this and she agreed to proceed with manufacturer assistance. I will arrange with [facility] to ship the first fill to them on Monday 09/24/18. I will also watch for a grant foundation to open and will apply if it does.  Joaquim Lai verbalized understanding and great appreciation.  [place] Patient [place] Phone [phone] Fax [phone]

## 2018-09-21 NOTE — Telephone Encounter (Signed)
Oral Oncology Pharmacist Encounter  Received new referral for Tasigna (nilotinib) for the treatment of relapsing chronic myeloid leukemia, planned duration until disease progression or unacceptable toxicity.  Original diagnosis in September 2013. Patient started treatment with dasatinib from October 2013 through August 2016. Course was complicated by pancytopenia, and issues with fluid retention, necessitating dose reductions, then dose increases as control of disease was lost. Dasatinib was eventually discontinued due to recurrent pleural effusions. Patient started on treatment with bosutinib in August 2016, treatment course has been complicated by severe diarrhea requiring multiple agents for control.  Labs from 09/06/2018 assessed, OK for treatment.  Noted patient with heart failure and multiple cardiac issues. Last ECHO performed 06/07/2016 shows LVEF 45-50%  EKG performed 09/07/18 shows QTc 464msec Recommend EKG repeat after Tasigna initiation. Electrolytes will be closely monitored while on treatment.  Current medication list in Epic reviewed, moderate DDI with Tasigna identified:  Tasigna and Lipitor: Category C interaction: Tasigna is an inhibitor of CYP3A4 thereby leading to possible decreased metabolism and increase systemic exposure to the atorvastatin.  Patient currently on atorvastatin 80 mg once daily, this is the highest dose of atorvastatin available.  Lipid panel reviewed, LDL within goal range.  Patient will be counseled about increase in side effects due to increase Lipitor exposure.  This will continue to be monitored.  No change to current therapy is indicated at this time.  Prescription will be e-scribed to the [facility] for benefits analysis and approval.  Oral Oncology Clinic will continue to follow for insurance authorization, copayment issues, initial counseling and start date.  Johny Drilling, PharmD, BCPS, BCOP  09/21/2018 12:04 PM Oral  Oncology Clinic [phone]

## 2018-09-24 MED FILL — TASIGNA 150 MG CAPSULE: 150 | 28 days supply | Qty: 112 | Fill #0

## 2018-09-25 ENCOUNTER — Other Ambulatory Visit: Payer: Self-pay

## 2018-09-25 ENCOUNTER — Telehealth: Payer: Self-pay

## 2018-09-25 ENCOUNTER — Inpatient Hospital Stay: Payer: Medicare HMO

## 2018-09-25 NOTE — Telephone Encounter (Signed)
Oral Oncology Patient Advocate Encounter  CML grant funds opened with [employer], I was able to submit an application for the patient. LLS is requiring income documentation to be submitted, I called Joaquim Lai and she will mail me their most recent tax return. When I receive that I will load it into the portal.  This encounter will be updated until final determination.  [place] Patient [place] Phone [phone] Fax [phone]

## 2018-09-25 NOTE — Telephone Encounter (Signed)
Oral Oncology Patient Advocate Encounter  I faxed the completed [employer] application today, 6/81/27.  This encounter will be updated until final determination.  [place] Patient [place] Phone [phone] Fax [phone]

## 2018-09-26 NOTE — Telephone Encounter (Signed)
Oral Oncology Patient Advocate Encounter  I followed up with [employer] today to make sure they have everything they need. [employer] has not received the faxes yet, it usually takes 2-3 days for their faxes to come through but the representative stated she would ask someone to pull them and get the process started.  This encounter will be updated until final determination.  [place] Patient [place] Phone [phone] Fax [phone]

## 2018-09-27 ENCOUNTER — Other Ambulatory Visit: Payer: Self-pay | Admitting: Family Medicine

## 2018-09-27 NOTE — Telephone Encounter (Signed)
Ok to refill??  Last office visit 09/21/2018.  Last refill 07/17/2018, #1 refill.

## 2018-09-28 ENCOUNTER — Telehealth: Payer: Self-pay | Admitting: Family Medicine

## 2018-09-28 NOTE — Telephone Encounter (Signed)
Pt called wanting to know why we have not scheduled his carotid doppler, I do not see where the referral has been placed in epic yet. Can we put order in so that SJ can get it scheduled.

## 2018-09-28 NOTE — Telephone Encounter (Signed)
This is in Epic under CV Proc

## 2018-10-01 NOTE — Telephone Encounter (Signed)
Oral Oncology Patient Advocate Encounter  I was successful at securing a grant with [employer] for $2000. The grant information is as follows and has been shared with [place]. The copay for the Tasigna is $2771.66 so this will not be enough money to cover the cost. I will continue to work on the manufacturer assistance application.  Approval dates: 09/08/18-09/08/19 ID: [id number] Group: [id number] BIN: [id number] PCN: [id number]  I called the patients wife, Dub Mikes and gave her this information. She verbalized understanding and great appreciation.   [name] Patient [place] Phone [phone] Fax [phone] 10/01/2018   9:39 AM

## 2018-10-01 NOTE — Telephone Encounter (Signed)
Oral Oncology Patient Advocate Encounter  I followed up with [employer] on the [place] patient assistance application. [employer] states that the application is now in benefit investigation and the representative made a note that it was time sensitive so it will be expedited.   I called the patients wife and gave her this information, she verbalized understanding and great appreciation.  [place] Patient [place] Phone [phone] Fax [phone] 10/01/2018   11:41 AM

## 2018-10-03 ENCOUNTER — Encounter: Payer: Self-pay | Admitting: Hematology and Oncology

## 2018-10-03 ENCOUNTER — Telehealth: Payer: Self-pay | Admitting: Hematology and Oncology

## 2018-10-03 ENCOUNTER — Inpatient Hospital Stay: Payer: Medicare HMO

## 2018-10-03 ENCOUNTER — Inpatient Hospital Stay: Payer: Medicare HMO | Admitting: Hematology and Oncology

## 2018-10-03 ENCOUNTER — Other Ambulatory Visit: Payer: Self-pay

## 2018-10-03 VITALS — BP 117/59 | HR 65 | Temp 98.2°F | Resp 18

## 2018-10-03 DIAGNOSIS — I255 Ischemic cardiomyopathy: Secondary | ICD-10-CM

## 2018-10-03 DIAGNOSIS — T451X5A Adverse effect of antineoplastic and immunosuppressive drugs, initial encounter: Secondary | ICD-10-CM

## 2018-10-03 DIAGNOSIS — C9210 Chronic myeloid leukemia, BCR/ABL-positive, not having achieved remission: Secondary | ICD-10-CM | POA: Diagnosis not present

## 2018-10-03 DIAGNOSIS — D6481 Anemia due to antineoplastic chemotherapy: Secondary | ICD-10-CM

## 2018-10-03 LAB — CBC WITH DIFFERENTIAL/PLATELET
Abs Immature Granulocytes: 0.03 10*3/uL (ref 0.00–0.07)
BASOS PCT: 1 %
Basophils Absolute: 0.1 10*3/uL (ref 0.0–0.1)
Eosinophils Absolute: 0.1 10*3/uL (ref 0.0–0.5)
Eosinophils Relative: 1 %
HCT: 40.4 % (ref 39.0–52.0)
Hemoglobin: 12.6 g/dL — ABNORMAL LOW (ref 13.0–17.0)
Immature Granulocytes: 0 %
Lymphocytes Relative: 11 %
Lymphs Abs: 0.8 10*3/uL (ref 0.7–4.0)
MCH: 30.6 pg (ref 26.0–34.0)
MCHC: 31.2 g/dL (ref 30.0–36.0)
MCV: 98.1 fL (ref 80.0–100.0)
Monocytes Absolute: 0.4 10*3/uL (ref 0.1–1.0)
Monocytes Relative: 6 %
Neutro Abs: 5.9 10*3/uL (ref 1.7–7.7)
Neutrophils Relative %: 81 %
PLATELETS: 177 10*3/uL (ref 150–400)
RBC: 4.12 MIL/uL — ABNORMAL LOW (ref 4.22–5.81)
RDW: 14.1 % (ref 11.5–15.5)
WBC: 7.3 10*3/uL (ref 4.0–10.5)
nRBC: 0 % (ref 0.0–0.2)

## 2018-10-03 LAB — COMPREHENSIVE METABOLIC PANEL
ALT: 17 U/L (ref 0–44)
AST: 13 U/L — ABNORMAL LOW (ref 15–41)
Albumin: 3.4 g/dL — ABNORMAL LOW (ref 3.5–5.0)
Alkaline Phosphatase: 106 U/L (ref 38–126)
Anion gap: 9 (ref 5–15)
BILIRUBIN TOTAL: 1.2 mg/dL (ref 0.3–1.2)
BUN: 18 mg/dL (ref 8–23)
CO2: 26 mmol/L (ref 22–32)
Calcium: 8.6 mg/dL — ABNORMAL LOW (ref 8.9–10.3)
Chloride: 109 mmol/L (ref 98–111)
Creatinine, Ser: 1.2 mg/dL (ref 0.61–1.24)
GFR calc Af Amer: >60 mL/min (ref >60)
GFR calc non Af Amer: 59 mL/min — ABNORMAL LOW (ref >60)
Glucose, Bld: 134 mg/dL — ABNORMAL HIGH (ref 70–99)
Potassium: 4 mmol/L (ref 3.5–5.1)
Sodium: 144 mmol/L (ref 135–145)
Total Protein: 6.5 g/dL (ref 6.5–8.1)

## 2018-10-03 LAB — MAGNESIUM: Magnesium: 2.1 mg/dL (ref 1.7–2.4)

## 2018-10-03 NOTE — Assessment & Plan Note (Signed)
So far, he tolerated chemotherapy very well without major side effects He will continue once a week blood count monitoring for the first month On 11/21/2018, he will get repeat BCR able and I will see him back afterwards for further assessment of response to treatment The goal of treatment would be palliative and indefinite treatment withTasiigna due to relapse while on active surveillance

## 2018-10-03 NOTE — Telephone Encounter (Signed)
Gave avs and calendar ° °

## 2018-10-03 NOTE — Assessment & Plan Note (Signed)
This is likely anemia of chronic disease. The patient denies recent history of bleeding such as epistaxis, hematuria or hematochezia. He is asymptomatic from the anemia. We will observe for now.  He does not require transfusion now. I do not recommend any further work-up at this time.   

## 2018-10-03 NOTE — Progress Notes (Signed)
[place] OFFICE PROGRESS NOTE  Patient Care Team: [name], MD as PCP - General (Family Medicine) [name]., MD as Attending Physician (Cardiology) [name], MD (Family Medicine) [name], MD (Cardiothoracic Surgery) Early, Arvilla Meres, MD as Attending Physician (Vascular Surgery) [name], MD as Consulting Physician (Hematology and Oncology) Deboraha Sprang, MD as Consulting Physician (Cardiology)  ASSESSMENT & PLAN:  Chronic myelogenous leukemia So far, he tolerated chemotherapy very well without major side effects He will continue once a week blood count monitoring for the first month On 11/21/2018, he will get repeat BCR able and I will see him back afterwards for further assessment of response to treatment The goal of treatment would be palliative and indefinite treatment withTasiigna due to relapse while on active surveillance  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 The patient denies recent history of bleeding such as epistaxis, hematuria or hematochezia. He is asymptomatic from the anemia. We will observe for now.  He does not require transfusion now. I do not recommend any further work-up at this time.    Cardiomyopathy, ischemic Clinically, he does not have signs of volume overload. He will continue medical management. EKG is stable.   Orders Placed This Encounter  Procedures  . BCR-ABL    With RT-PCR technique    Standing Status:   Future    Standing Expiration Date:   11/07/2019    INTERVAL HISTORY: Please see below for problem oriented charting. He returns with his wife for further follow-up He tolerated testing now well No recent infection, fever or chills No bleeding Denies recent cough, chest pain or shortness of breath.  No leg edema. Denies diarrhea.  His chronic bruising is stable.  SUMMARY OF ONCOLOGIC HISTORY:   Chronic myelogenous leukemia ([place])    05/04/2012 Bone Marrow Biopsy    BM confirmed diagnosis of CML in Chronic phase. BCR/ABL by PCR detected abnormalitis with b2a2 & b3a2 subtypes    05/09/2012 - 03/16/2015 Chemotherapy    He was started on treatment with Dasatinib 100 mg daily    06/06/2012 Adverse Reaction    Dose of medication was reduced to 50 mg daily due to pancytopenia    01/24/2013 Progression    Patient was noted to have elevated blood count which and was subsequently found to be noncompliant to treatment. The patient has not been on treatment for several months because his prescription ran out. He was restarted back on treatment    01/16/2014 Progression    Bcr/ABL by PCR is worse. Dose of Dasatinib was increased to 100 mg.    02/26/2014 Tumor Marker    Blood work for ABL kinase mutation was negative.    10/29/2014 Tumor Marker    BCR/ABL b2a2 & b3a2 0.29%, IS 0.1624%, not in MMR yet but improving    11/05/2014 Adverse Reaction    He had thoracentesis due to pleural effusion    11/27/2014 Surgery    He underwent right video-assisted thoracoscopy, Drainage of pleural effusion, Pleural biopsy, Diaphragm biopsy, Lung biopsy & Talc pleurodesis    01/30/2015 Tumor Marker    BCR/ABL b2a2 & b3a2 0.78%, IS 0.4368%, not in MMR     03/11/2015 Pathology Results    BCR/ABL b2a2 0.19%, IS 0.1064%, not in MMR     03/16/2015 Adverse Reaction    Dasatinib was stopped due to recurrent pleural effusion    03/28/2015 - 11/06/2017 Chemotherapy    He started on  Bosutinib    03/30/2015 Procedure    He has therapeutic thoracentesis of the right lung with 1 liter of fluid removed    03/30/2015 Adverse Reaction    Bosutinib is placed on hold, to be restart on 8/25 at 250 mg due to severe diarrhea    04/28/2015 Tumor Marker    BCR/ABL b2a2 0.06%, IS 0.0336%, in MMR     06/10/2015 Tumor Marker    BCR/ABL b2a2 0.14%, IS 0.1162%, not in MMR     08/14/2015 Pathology Results    BCR/ABL b2a2 0.02%, IS 0.0166%, In MMR     11/09/2015 Tumor Marker     BCR/ABL b2a2 0.005%, IS 0.0042%, In MMR     02/08/2016 Tumor Marker    BCR/ABL undetectable. In MMR    06/13/2016 Tumor Marker    BCR/ABL undetectable. In MMR    12/06/2016 Pathology Results    BCR/ABL undetectable. In MMR    06/08/2017 Pathology Results    BCR/ABL undetectable. In MMR    09/08/2017 Pathology Results    BCR/ABL undetectable. In MMR    12/11/2017 Pathology Results    BCR/ABL undetectable. In MMR    03/19/2018 Pathology Results    BCR/ABL undetectable. In MMR    06/12/2018 Pathology Results    BCR/ABL undetectable. In MMR    09/06/2018 Pathology Results    BCR ABL is detectable. e13a2 (b2a2) and P61P5(K9T2) IS: 0.009%    09/26/2018 -  Chemotherapy    The patient had Tasigna      REVIEW OF SYSTEMS:   Constitutional: Denies fevers, chills or abnormal weight loss Eyes: Denies blurriness of vision Ears, nose, mouth, throat, and face: Denies mucositis or sore throat Respiratory: Denies cough, dyspnea or wheezes Cardiovascular: Denies palpitation, chest discomfort or lower extremity swelling Gastrointestinal:  Denies nausea, heartburn or change in bowel habits Skin: Denies abnormal skin rashes Lymphatics: Denies new lymphadenopathy or easy bruising Neurological:Denies numbness, tingling or new weaknesses Behavioral/Psych: Mood is stable, no new changes  All other systems were reviewed with the patient and are negative.  I have reviewed the past medical history, past surgical history, social history and family history with the patient and they are unchanged from previous note.  ALLERGIES:  is allergic to norvasc [amlodipine besylate] and sulfa antibiotics.  MEDICATIONS:  Current Outpatient Medications  Medication Sig Dispense Refill  . atorvastatin (LIPITOR) 80 MG tablet TAKE ONE TABLET BY MOUTH ONCE DAILY AT 6 PM. 90 tablet 4  . carvedilol (COREG) 12.5 MG tablet Take 1 tablet (12.5 mg total) by mouth 2 (two) times daily. 180 tablet 3  . clopidogrel (PLAVIX) 75  MG tablet TAKE 1 TABLET BY MOUTH DAILY 90 tablet 3  . diazepam (VALIUM) 10 MG tablet TAKE 1/2 TO 1 (ONE-HALF TO ONE) TABLET BY MOUTH EVERY 8 HOURS AS NEEDED FOR MUSCLE SPASM FOR ANXIETY 30 tablet 0  . mexiletine (MEXITIL) 200 MG capsule Take 1 capsule (200 mg total) by mouth 2 (two) times daily. 180 capsule 3  . nilotinib (TASIGNA) 150 MG capsule Take 2 capsules (300 mg total) by mouth every 12 (twelve) hours. Take on an empty stomach, 1 hr before or 2 hrs after food. 120 capsule 10  . nitroGLYCERIN (NITROSTAT) 0.4 MG SL tablet DISSOLVE 1 TABLET UNDER THE TONGUE EVERY 5 MINUTES AS NEEDED FOR CHEST PAIN 25 tablet 4  . sildenafil (VIAGRA) 100 MG tablet Take 0.5-1 tablets (50-100 mg total) by mouth daily as needed for erectile dysfunction. 5 tablet 11  . valsartan (DIOVAN)  160 MG tablet TAKE 1 TABLET BY MOUTH ONCE DAILY DISCONTIUE ENTRESTO  3   No current facility-administered medications for this visit.     PHYSICAL EXAMINATION: ECOG PERFORMANCE STATUS: 0 - Asymptomatic  Vitals:   10/03/18 1003  BP: (!) 117/59  Pulse: 65  Resp: 18  Temp: 98.2 F (36.8 C)  SpO2: 100%   There were no vitals filed for this visit.  GENERAL:alert, no distress and comfortable SKIN: skin color, texture, turgor are normal, no rashes or significant lesions EYES: normal, Conjunctiva are pink and non-injected, sclera clear OROPHARYNX:no exudate, no erythema and lips, buccal mucosa, and tongue normal  NECK: supple, thyroid normal size, non-tender, without nodularity LYMPH:  no palpable lymphadenopathy in the cervical, axillary or inguinal LUNGS: clear to auscultation and percussion with normal breathing effort HEART: regular rate & rhythm and no murmurs and no lower extremity edema ABDOMEN:abdomen soft, non-tender and normal bowel sounds Musculoskeletal:no cyanosis of digits and no clubbing  NEURO: alert & oriented x 3 with fluent speech, no focal motor/sensory deficits  LABORATORY DATA:  I have reviewed the  data as listed    Component Value Date/Time   NA 144 10/03/2018 0859   NA 142 06/08/2017 0832   K 4.0 10/03/2018 0859   K 3.9 06/08/2017 0832   CL 109 10/03/2018 0859   CL 110 (H) 11/14/2012 0918   CO2 26 10/03/2018 0859   CO2 25 06/08/2017 0832   GLUCOSE 134 (H) 10/03/2018 0859   GLUCOSE 98 06/08/2017 0832   GLUCOSE 108 (H) 11/14/2012 0918   BUN 18 10/03/2018 0859   BUN 11.6 06/08/2017 0832   CREATININE 1.20 10/03/2018 0859   CREATININE 1.31 (H) 03/30/2018 1218   CREATININE 0.9 06/08/2017 0832   CALCIUM 8.6 (L) 10/03/2018 0859   CALCIUM 8.6 06/08/2017 0832   PROT 6.5 10/03/2018 0859   PROT 6.7 06/08/2017 0832   ALBUMIN 3.4 (L) 10/03/2018 0859   ALBUMIN 3.4 (L) 06/08/2017 0832   AST 13 (L) 10/03/2018 0859   AST 13 06/08/2017 0832   ALT 17 10/03/2018 0859   ALT 7 06/08/2017 0832   ALKPHOS 106 10/03/2018 0859   ALKPHOS 96 06/08/2017 0832   BILITOT 1.2 10/03/2018 0859   BILITOT 0.44 06/08/2017 0832   GFRNONAA 59 (L) 10/03/2018 0859   GFRNONAA 53 (L) 03/30/2018 1218   GFRAA >60 10/03/2018 0859   GFRAA 62 03/30/2018 1218    No results found for: SPEP, UPEP  Lab Results  Component Value Date   WBC 7.3 10/03/2018   NEUTROABS 5.9 10/03/2018   HGB 12.6 (L) 10/03/2018   HCT 40.4 10/03/2018   MCV 98.1 10/03/2018   PLT 177 10/03/2018      Chemistry      Component Value Date/Time   NA 144 10/03/2018 0859   NA 142 06/08/2017 0832   K 4.0 10/03/2018 0859   K 3.9 06/08/2017 0832   CL 109 10/03/2018 0859   CL 110 (H) 11/14/2012 0918   CO2 26 10/03/2018 0859   CO2 25 06/08/2017 0832   BUN 18 10/03/2018 0859   BUN 11.6 06/08/2017 0832   CREATININE 1.20 10/03/2018 0859   CREATININE 1.31 (H) 03/30/2018 1218   CREATININE 0.9 06/08/2017 0832      Component Value Date/Time   CALCIUM 8.6 (L) 10/03/2018 0859   CALCIUM 8.6 06/08/2017 0832   ALKPHOS 106 10/03/2018 0859   ALKPHOS 96 06/08/2017 0832   AST 13 (L) 10/03/2018 0859   AST 13 06/08/2017 8016  ALT 17 10/03/2018  0859   ALT 7 06/08/2017 0832   BILITOT 1.2 10/03/2018 0859   BILITOT 0.44 06/08/2017 0832      All questions were answered. The patient knows to call the clinic with any problems, questions or concerns. No barriers to learning was detected.  I spent 15 minutes counseling the patient face to face. The total time spent in the appointment was 20 minutes and more than 50% was on counseling and review of test results  [name], MD 10/03/2018 10:47 AM

## 2018-10-03 NOTE — Assessment & Plan Note (Signed)
Clinically, he does not have signs of volume overload. He will continue medical management. EKG is stable.

## 2018-10-05 ENCOUNTER — Telehealth: Payer: Self-pay

## 2018-10-05 NOTE — Telephone Encounter (Signed)
Oral Oncology Patient Advocate Encounter  I was successful at securing a grant with [facility] for $15,000. This will keep the out of pocket expense for Tasigna at $0. The grant information is as follows and has been shared with [place].  Approval dates: 09/05/18-09/05/19 ID: [id number] Group: [id number] BIN: [id number] PCN: [id number]  The patient has a LLS grant that will only cover $2000 so when the pharmacy fills [place] in March they will bill the copay to the [employer] and [place] to use the [employer] up.   I called Dub Mikes and gave her this information, she verbalized understanding and great appreciation.  [place] Patient [place] Phone [phone] Fax [phone] 10/05/2018   10:40 AM

## 2018-10-05 NOTE — Telephone Encounter (Signed)
Oral Oncology Patient Advocate Encounter  I was able to get the patient a [place].   This application will be cancelled.  [place] Patient [place] Phone [phone] Fax [phone] 10/05/2018   11:44 AM

## 2018-10-10 ENCOUNTER — Inpatient Hospital Stay: Payer: Medicare HMO | Attending: Hematology and Oncology

## 2018-10-10 DIAGNOSIS — C9210 Chronic myeloid leukemia, BCR/ABL-positive, not having achieved remission: Secondary | ICD-10-CM | POA: Diagnosis not present

## 2018-10-10 LAB — COMPREHENSIVE METABOLIC PANEL
ALT: 21 U/L (ref 0–44)
AST: 19 U/L (ref 15–41)
Albumin: 3.5 g/dL (ref 3.5–5.0)
Alkaline Phosphatase: 103 U/L (ref 38–126)
Anion gap: 7 (ref 5–15)
BUN: 19 mg/dL (ref 8–23)
CO2: 23 mmol/L (ref 22–32)
Calcium: 8.5 mg/dL — ABNORMAL LOW (ref 8.9–10.3)
Chloride: 115 mmol/L — ABNORMAL HIGH (ref 98–111)
Creatinine, Ser: 1.06 mg/dL (ref 0.61–1.24)
GFR calc non Af Amer: >60 mL/min (ref >60)
Glucose, Bld: 102 mg/dL — ABNORMAL HIGH (ref 70–99)
Potassium: 3.9 mmol/L (ref 3.5–5.1)
Sodium: 145 mmol/L (ref 135–145)
Total Bilirubin: 0.9 mg/dL (ref 0.3–1.2)
Total Protein: 6.4 g/dL — ABNORMAL LOW (ref 6.5–8.1)

## 2018-10-10 LAB — CBC WITH DIFFERENTIAL/PLATELET
Abs Immature Granulocytes: 0.02 10*3/uL (ref 0.00–0.07)
Basophils Absolute: 0.1 10*3/uL (ref 0.0–0.1)
Basophils Relative: 1 %
EOS ABS: 0.1 10*3/uL (ref 0.0–0.5)
Eosinophils Relative: 1 %
HCT: 39.8 % (ref 39.0–52.0)
Hemoglobin: 12.4 g/dL — ABNORMAL LOW (ref 13.0–17.0)
Immature Granulocytes: 0 %
Lymphocytes Relative: 12 %
Lymphs Abs: 0.8 10*3/uL (ref 0.7–4.0)
MCH: 30.1 pg (ref 26.0–34.0)
MCHC: 31.2 g/dL (ref 30.0–36.0)
MCV: 96.6 fL (ref 80.0–100.0)
Monocytes Absolute: 0.6 10*3/uL (ref 0.1–1.0)
Monocytes Relative: 9 %
Neutro Abs: 5.4 10*3/uL (ref 1.7–7.7)
Neutrophils Relative %: 77 %
Platelets: 194 10*3/uL (ref 150–400)
RBC: 4.12 MIL/uL — AB (ref 4.22–5.81)
RDW: 14.4 % (ref 11.5–15.5)
WBC: 7 10*3/uL (ref 4.0–10.5)
nRBC: 0 % (ref 0.0–0.2)

## 2018-10-10 LAB — MAGNESIUM: MAGNESIUM: 2 mg/dL (ref 1.7–2.4)

## 2018-10-11 ENCOUNTER — Telehealth: Payer: Self-pay

## 2018-10-11 NOTE — Telephone Encounter (Signed)
-----   Message from [name], MD sent at 10/11/2018  7:39 AM EST ----- Regarding: labs are ok Call him and let him know labs are ok Continue Tasigna

## 2018-10-11 NOTE — Telephone Encounter (Signed)
Called and given below message. He verbalized understanding. 

## 2018-10-15 ENCOUNTER — Ambulatory Visit
Admission: RE | Admit: 2018-10-15 | Discharge: 2018-10-15 | Disposition: A | Discharge status: Home / Self Care | Payer: Medicare HMO | Source: Ambulatory Visit | Attending: Family Medicine | Admitting: Family Medicine

## 2018-10-15 DIAGNOSIS — I6523 Occlusion and stenosis of bilateral carotid arteries: Secondary | ICD-10-CM | POA: Diagnosis not present

## 2018-10-15 DIAGNOSIS — I6521 Occlusion and stenosis of right carotid artery: Secondary | ICD-10-CM

## 2018-10-17 ENCOUNTER — Other Ambulatory Visit: Payer: Self-pay

## 2018-10-17 ENCOUNTER — Inpatient Hospital Stay: Payer: Medicare HMO

## 2018-10-17 DIAGNOSIS — C9210 Chronic myeloid leukemia, BCR/ABL-positive, not having achieved remission: Secondary | ICD-10-CM

## 2018-10-17 LAB — COMPREHENSIVE METABOLIC PANEL
ALBUMIN: 3.2 g/dL — AB (ref 3.5–5.0)
ALT: 20 U/L (ref 0–44)
AST: 15 U/L (ref 15–41)
Alkaline Phosphatase: 123 U/L (ref 38–126)
Anion gap: 8 (ref 5–15)
BUN: 12 mg/dL (ref 8–23)
CO2: 26 mmol/L (ref 22–32)
Calcium: 8.3 mg/dL — ABNORMAL LOW (ref 8.9–10.3)
Chloride: 111 mmol/L (ref 98–111)
Creatinine, Ser: 1.02 mg/dL (ref 0.61–1.24)
GFR calc Af Amer: >60 mL/min (ref >60)
GFR calc non Af Amer: >60 mL/min (ref >60)
Glucose, Bld: 95 mg/dL (ref 70–99)
Potassium: 4 mmol/L (ref 3.5–5.1)
Sodium: 145 mmol/L (ref 135–145)
Total Bilirubin: 0.9 mg/dL (ref 0.3–1.2)
Total Protein: 6.3 g/dL — ABNORMAL LOW (ref 6.5–8.1)

## 2018-10-17 LAB — MAGNESIUM: Magnesium: 1.9 mg/dL (ref 1.7–2.4)

## 2018-10-17 LAB — CBC WITH DIFFERENTIAL/PLATELET
Abs Immature Granulocytes: 0.05 10*3/uL (ref 0.00–0.07)
Basophils Absolute: 0.1 10*3/uL (ref 0.0–0.1)
Basophils Relative: 1 %
Eosinophils Absolute: 0.1 10*3/uL (ref 0.0–0.5)
Eosinophils Relative: 1 %
HCT: 37.6 % — ABNORMAL LOW (ref 39.0–52.0)
Hemoglobin: 11.7 g/dL — ABNORMAL LOW (ref 13.0–17.0)
Immature Granulocytes: 1 %
Lymphocytes Relative: 12 %
Lymphs Abs: 0.9 10*3/uL (ref 0.7–4.0)
MCH: 30.2 pg (ref 26.0–34.0)
MCHC: 31.1 g/dL (ref 30.0–36.0)
MCV: 96.9 fL (ref 80.0–100.0)
MONOS PCT: 8 %
Monocytes Absolute: 0.6 10*3/uL (ref 0.1–1.0)
Neutro Abs: 5.6 10*3/uL (ref 1.7–7.7)
Neutrophils Relative %: 77 %
Platelets: 170 10*3/uL (ref 150–400)
RBC: 3.88 MIL/uL — ABNORMAL LOW (ref 4.22–5.81)
RDW: 14.4 % (ref 11.5–15.5)
WBC: 7.3 10*3/uL (ref 4.0–10.5)
nRBC: 0 % (ref 0.0–0.2)

## 2018-10-18 MED FILL — TASIGNA 150 MG CAPSULE: 150 | 28 days supply | Qty: 112 | Fill #1

## 2018-10-23 ENCOUNTER — Other Ambulatory Visit: Payer: Self-pay | Admitting: Family Medicine

## 2018-10-23 NOTE — Telephone Encounter (Signed)
Ok to refill??  Last office visit 09/21/2018.  Last refill 09/27/2018.

## 2018-10-24 ENCOUNTER — Inpatient Hospital Stay: Payer: Medicare HMO

## 2018-11-01 ENCOUNTER — Other Ambulatory Visit: Payer: Self-pay | Admitting: Family Medicine

## 2018-11-01 MED ORDER — ALBUTEROL SULFATE HFA 108 (90 BASE) MCG/ACT IN AERS: 2.0000 | INHALATION_SPRAY | Freq: Four times a day (QID) | RESPIRATORY_TRACT | 0 refills | Status: DC | PRN

## 2018-11-01 NOTE — Telephone Encounter (Signed)
Refill on his ventolin inhaler to [facility].

## 2018-11-01 NOTE — Telephone Encounter (Signed)
Prescription sent to pharmacy.

## 2018-11-14 MED FILL — TASIGNA 150 MG CAPSULE: 150 | 28 days supply | Qty: 112 | Fill #2

## 2018-11-19 ENCOUNTER — Other Ambulatory Visit: Payer: Self-pay | Admitting: Family Medicine

## 2018-11-19 NOTE — Telephone Encounter (Signed)
Ok to refill Diazepam??  Last office visit 09/21/2018.  Last refill 10/23/2018.

## 2018-11-21 ENCOUNTER — Other Ambulatory Visit: Payer: Self-pay

## 2018-11-21 ENCOUNTER — Inpatient Hospital Stay: Payer: Medicare HMO | Attending: Hematology and Oncology

## 2018-11-21 ENCOUNTER — Encounter: Payer: Self-pay | Admitting: Hematology and Oncology

## 2018-11-21 DIAGNOSIS — C9210 Chronic myeloid leukemia, BCR/ABL-positive, not having achieved remission: Secondary | ICD-10-CM

## 2018-11-21 DIAGNOSIS — Z79899 Other long term (current) drug therapy: Secondary | ICD-10-CM | POA: Diagnosis not present

## 2018-11-21 DIAGNOSIS — D6481 Anemia due to antineoplastic chemotherapy: Secondary | ICD-10-CM | POA: Diagnosis not present

## 2018-11-21 DIAGNOSIS — I255 Ischemic cardiomyopathy: Secondary | ICD-10-CM | POA: Insufficient documentation

## 2018-11-21 DIAGNOSIS — Z9221 Personal history of antineoplastic chemotherapy: Secondary | ICD-10-CM | POA: Diagnosis not present

## 2018-11-21 DIAGNOSIS — T451X5D Adverse effect of antineoplastic and immunosuppressive drugs, subsequent encounter: Secondary | ICD-10-CM | POA: Insufficient documentation

## 2018-11-21 LAB — CBC WITH DIFFERENTIAL/PLATELET
Abs Immature Granulocytes: 0.03 10*3/uL (ref 0.00–0.07)
Basophils Absolute: 0 10*3/uL (ref 0.0–0.1)
Basophils Relative: 1 %
Eosinophils Absolute: 0.1 10*3/uL (ref 0.0–0.5)
Eosinophils Relative: 1 %
HCT: 36.8 % — ABNORMAL LOW (ref 39.0–52.0)
Hemoglobin: 11.7 g/dL — ABNORMAL LOW (ref 13.0–17.0)
Immature Granulocytes: 0 %
Lymphocytes Relative: 12 %
Lymphs Abs: 0.8 10*3/uL (ref 0.7–4.0)
MCH: 30.5 pg (ref 26.0–34.0)
MCHC: 31.8 g/dL (ref 30.0–36.0)
MCV: 95.8 fL (ref 80.0–100.0)
Monocytes Absolute: 0.6 10*3/uL (ref 0.1–1.0)
Monocytes Relative: 9 %
Neutro Abs: 5.2 10*3/uL (ref 1.7–7.7)
Neutrophils Relative %: 77 %
Platelets: 187 10*3/uL (ref 150–400)
RBC: 3.84 MIL/uL — ABNORMAL LOW (ref 4.22–5.81)
RDW: 16.1 % — ABNORMAL HIGH (ref 11.5–15.5)
WBC: 6.7 10*3/uL (ref 4.0–10.5)
nRBC: 0 % (ref 0.0–0.2)

## 2018-11-21 LAB — COMPREHENSIVE METABOLIC PANEL
ALT: 21 U/L (ref 0–44)
AST: 17 U/L (ref 15–41)
Albumin: 3.1 g/dL — ABNORMAL LOW (ref 3.5–5.0)
Alkaline Phosphatase: 116 U/L (ref 38–126)
Anion gap: 10 (ref 5–15)
BUN: 12 mg/dL (ref 8–23)
CO2: 23 mmol/L (ref 22–32)
Calcium: 8.2 mg/dL — ABNORMAL LOW (ref 8.9–10.3)
Chloride: 113 mmol/L — ABNORMAL HIGH (ref 98–111)
Creatinine, Ser: 0.95 mg/dL (ref 0.61–1.24)
GFR calc Af Amer: >60 mL/min (ref >60)
GFR calc non Af Amer: >60 mL/min (ref >60)
Glucose, Bld: 118 mg/dL — ABNORMAL HIGH (ref 70–99)
Potassium: 3.7 mmol/L (ref 3.5–5.1)
Sodium: 146 mmol/L — ABNORMAL HIGH (ref 135–145)
Total Bilirubin: 0.9 mg/dL (ref 0.3–1.2)
Total Protein: 6.3 g/dL — ABNORMAL LOW (ref 6.5–8.1)

## 2018-11-21 LAB — MAGNESIUM: Magnesium: 2 mg/dL (ref 1.7–2.4)

## 2018-12-03 ENCOUNTER — Other Ambulatory Visit: Payer: Self-pay

## 2018-12-03 ENCOUNTER — Inpatient Hospital Stay: Payer: Medicare HMO | Admitting: Hematology and Oncology

## 2018-12-03 ENCOUNTER — Telehealth: Payer: Self-pay | Admitting: Pharmacist

## 2018-12-03 VITALS — BP 161/60 | HR 80 | Temp 98.0°F | Resp 18 | Ht 69.0 in | Wt 183.2 lb

## 2018-12-03 DIAGNOSIS — Z9221 Personal history of antineoplastic chemotherapy: Secondary | ICD-10-CM

## 2018-12-03 DIAGNOSIS — C9210 Chronic myeloid leukemia, BCR/ABL-positive, not having achieved remission: Secondary | ICD-10-CM

## 2018-12-03 DIAGNOSIS — D6481 Anemia due to antineoplastic chemotherapy: Secondary | ICD-10-CM | POA: Diagnosis not present

## 2018-12-03 DIAGNOSIS — I255 Ischemic cardiomyopathy: Secondary | ICD-10-CM

## 2018-12-03 DIAGNOSIS — Z79899 Other long term (current) drug therapy: Secondary | ICD-10-CM | POA: Diagnosis not present

## 2018-12-03 DIAGNOSIS — T451X5D Adverse effect of antineoplastic and immunosuppressive drugs, subsequent encounter: Secondary | ICD-10-CM | POA: Diagnosis not present

## 2018-12-03 DIAGNOSIS — T451X5A Adverse effect of antineoplastic and immunosuppressive drugs, initial encounter: Secondary | ICD-10-CM

## 2018-12-03 NOTE — Telephone Encounter (Signed)
Oral Chemotherapy Pharmacist Encounter   Spoke with patient's wife, Anthony Wolfe, today to follow up regarding patient's oral chemotherapy medication: Tasigna (nilotinib)for the treatment of chronic myeloid leukemia, planned duration until disease progression or unacceptable drug toxicity.  Original Start date of oral chemotherapy: 09/26/18  Pt is doing well today  Pt reports 0 tablets/doses of Tasigna 150mg  capsules, 2capsules (300mg ) by mouth twice daily, with doses taken ~12 hours apart, taken on an empty stomach 1 hour before or 2 hours after food, missed in the last month.   Pt reports the following side effects: intermittent spells of dizziness, not too often, patient will lay down for a few minutes and then the spell passes  Pertinent labs reviewed: OK for continued treatment.  Other Issues:  Foundation copayment grant from [place] has been depleted, a second grant from [employer] was secured on 10/05/18 for $15,000 and is covering out of pocket expenses for Tasigna at the pharmacy.  Anthony Wolfe informed this grant is in place until the end of Jan 2021. She states she has received a letter from [facility] with the grant information.  All questions answered.  They know to call the office with questions or concerns. Oral Oncology Clinic will continue to follow.  Johny Drilling, PharmD, BCPS, BCOP  12/03/2018 11:16 AM Oral Oncology Clinic [phone]

## 2018-12-04 ENCOUNTER — Encounter: Payer: Self-pay | Admitting: Hematology and Oncology

## 2018-12-04 NOTE — Progress Notes (Signed)
[place] OFFICE PROGRESS NOTE  Patient Care Team: [name], MD as PCP - General (Family Medicine) [name]., MD as Attending Physician (Cardiology) [name], MD (Family Medicine) [name], MD (Cardiothoracic Surgery) Early, Arvilla Meres, MD as Attending Physician (Vascular Surgery) [name], MD as Consulting Physician (Hematology and Oncology) Deboraha Sprang, MD as Consulting Physician (Cardiology)  ASSESSMENT & PLAN:  Chronic myelogenous leukemia So far, he tolerated chemotherapy very well without major side effects I reviewed recent blood work and gave him a copy of the test results The patient has achieved complete response on chemotherapy The goal of treatment would be palliative and indefinite treatment withTasiigna due to relapse while on active surveillance He will continue treatment indefinitely I will see him back in 3 months for further follow-up  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 The patient denies recent history of bleeding such as epistaxis, hematuria or hematochezia. He is asymptomatic from the anemia. We will observe for now.  Cardiomyopathy, ischemic He continues on aggressive medical management He has no recent exacerbation of congestive heart failure Continue medical management   Orders Placed This Encounter  Procedures  . BCR-ABL    With RT-PCR technique    Standing Status:   Standing    Number of Occurrences:   9    Standing Expiration Date:   12/04/2019    INTERVAL HISTORY: Please see below for problem oriented charting. He returns for further follow-up He feels well He tolerated recent chemotherapy well He has concern about copayment assistant for his pills He denies recent cough, chest pain or shortness of breath No recent infection, fever or chills No recent changes in bowel habits such as diarrhea or nausea  SUMMARY OF ONCOLOGIC HISTORY:    Chronic myelogenous leukemia ([place])   05/04/2012 Bone Marrow Biopsy    BM confirmed diagnosis of CML in Chronic phase. BCR/ABL by PCR detected abnormalitis with b2a2 & b3a2 subtypes    05/09/2012 - 03/16/2015 Chemotherapy    He was started on treatment with Dasatinib 100 mg daily    06/06/2012 Adverse Reaction    Dose of medication was reduced to 50 mg daily due to pancytopenia    01/24/2013 Progression    Patient was noted to have elevated blood count which and was subsequently found to be noncompliant to treatment. The patient has not been on treatment for several months because his prescription ran out. He was restarted back on treatment    01/16/2014 Progression    Bcr/ABL by PCR is worse. Dose of Dasatinib was increased to 100 mg.    02/26/2014 Tumor Marker    Blood work for ABL kinase mutation was negative.    10/29/2014 Tumor Marker    BCR/ABL b2a2 & b3a2 0.29%, IS 0.1624%, not in MMR yet but improving    11/05/2014 Adverse Reaction    He had thoracentesis due to pleural effusion    11/27/2014 Surgery    He underwent right video-assisted thoracoscopy, Drainage of pleural effusion, Pleural biopsy, Diaphragm biopsy, Lung biopsy & Talc pleurodesis    01/30/2015 Tumor Marker    BCR/ABL b2a2 & b3a2 0.78%, IS 0.4368%, not in MMR     03/11/2015 Pathology Results    BCR/ABL b2a2 0.19%, IS 0.1064%, not in MMR     03/16/2015 Adverse Reaction    Dasatinib was stopped due to recurrent pleural effusion    03/28/2015 - 11/06/2017 Chemotherapy    He started  on Bosutinib    03/30/2015 Procedure    He has therapeutic thoracentesis of the right lung with 1 liter of fluid removed    03/30/2015 Adverse Reaction    Bosutinib is placed on hold, to be restart on 8/25 at 250 mg due to severe diarrhea    04/28/2015 Tumor Marker    BCR/ABL b2a2 0.06%, IS 0.0336%, in MMR     06/10/2015 Tumor Marker    BCR/ABL b2a2 0.14%, IS 0.1162%, not in MMR     08/14/2015 Pathology Results    BCR/ABL b2a2 0.02%, IS  0.0166%, In MMR     11/09/2015 Tumor Marker    BCR/ABL b2a2 0.005%, IS 0.0042%, In MMR     02/08/2016 Tumor Marker    BCR/ABL undetectable. In MMR    06/13/2016 Tumor Marker    BCR/ABL undetectable. In MMR    12/06/2016 Pathology Results    BCR/ABL undetectable. In MMR    06/08/2017 Pathology Results    BCR/ABL undetectable. In MMR    09/08/2017 Pathology Results    BCR/ABL undetectable. In MMR    12/11/2017 Pathology Results    BCR/ABL undetectable. In MMR    03/19/2018 Pathology Results    BCR/ABL undetectable. In MMR    06/12/2018 Pathology Results    BCR/ABL undetectable. In MMR    09/06/2018 Pathology Results    BCR ABL is detectable. e13a2 (b2a2) and [id number]) IS: 0.009%    09/26/2018 -  Chemotherapy    The patient had Tasigna     11/21/2018 Pathology Results    BCR/ABL undetectable. In MMR     REVIEW OF SYSTEMS:   Constitutional: Denies fevers, chills or abnormal weight loss Eyes: Denies blurriness of vision Ears, nose, mouth, throat, and face: Denies mucositis or sore throat Respiratory: Denies cough, dyspnea or wheezes Cardiovascular: Denies palpitation, chest discomfort or lower extremity swelling Gastrointestinal:  Denies nausea, heartburn or change in bowel habits Skin: Denies abnormal skin rashes Lymphatics: Denies new lymphadenopathy or easy bruising Neurological:Denies numbness, tingling or new weaknesses Behavioral/Psych: Mood is stable, no new changes  All other systems were reviewed with the patient and are negative.  I have reviewed the past medical history, past surgical history, social history and family history with the patient and they are unchanged from previous note.  ALLERGIES:  is allergic to norvasc [amlodipine besylate] and sulfa antibiotics.  MEDICATIONS:  Current Outpatient Medications  Medication Sig Dispense Refill  . albuterol (PROVENTIL HFA;VENTOLIN HFA) 108 (90 Base) MCG/ACT inhaler Inhale 2 puffs into the lungs every 6 (six) hours as  needed for wheezing or shortness of breath. 1 Inhaler 0  . atorvastatin (LIPITOR) 80 MG tablet TAKE ONE TABLET BY MOUTH ONCE DAILY AT 6 PM. 90 tablet 4  . carvedilol (COREG) 12.5 MG tablet Take 1 tablet (12.5 mg total) by mouth 2 (two) times daily. 180 tablet 3  . clopidogrel (PLAVIX) 75 MG tablet TAKE 1 TABLET BY MOUTH DAILY 90 tablet 3  . diazepam (VALIUM) 10 MG tablet TAKE 1/2 TO 1 (ONE-HALF TO ONE) TABLET BY MOUTH EVERY 8 HOURS AS NEEDED FOR MUSCLE SPASM OR  ANXIETY 30 tablet 0  . mexiletine (MEXITIL) 200 MG capsule Take 1 capsule (200 mg total) by mouth 2 (two) times daily. 180 capsule 3  . nilotinib (TASIGNA) 150 MG capsule Take 2 capsules (300 mg total) by mouth every 12 (twelve) hours. Take on an empty stomach, 1 hr before or 2 hrs after food. 120 capsule 10  . nitroGLYCERIN (NITROSTAT) 0.4 MG  SL tablet DISSOLVE 1 TABLET UNDER THE TONGUE EVERY 5 MINUTES AS NEEDED FOR CHEST PAIN 25 tablet 4  . sildenafil (VIAGRA) 100 MG tablet Take 0.5-1 tablets (50-100 mg total) by mouth daily as needed for erectile dysfunction. 5 tablet 11  . valsartan (DIOVAN) 160 MG tablet TAKE 1 TABLET BY MOUTH ONCE DAILY **DISCONTIUE  ENTRESTO** 90 tablet 0   No current facility-administered medications for this visit.     PHYSICAL EXAMINATION: ECOG PERFORMANCE STATUS: 1 - Symptomatic but completely ambulatory  Vitals:   12/03/18 1037  BP: (!) 161/60  Pulse: 80  Resp: 18  Temp: 98 F (36.7 C)  SpO2: 99%   Filed Weights   12/03/18 1037  Weight: 183 lb 3.2 oz (83.1 kg)    GENERAL:alert, no distress and comfortable SKIN: skin color, texture, turgor are normal, no rashes or significant lesions EYES: normal, Conjunctiva are pink and non-injected, sclera clear OROPHARYNX:no exudate, no erythema and lips, buccal mucosa, and tongue normal  NECK: supple, thyroid normal size, non-tender, without nodularity LYMPH:  no palpable lymphadenopathy in the cervical, axillary or inguinal LUNGS: clear to auscultation  and percussion with normal breathing effort HEART: regular rate & rhythm and no murmurs and no lower extremity edema ABDOMEN:abdomen soft, non-tender and normal bowel sounds Musculoskeletal:no cyanosis of digits and no clubbing  NEURO: alert & oriented x 3 with fluent speech, no focal motor/sensory deficits  LABORATORY DATA:  I have reviewed the data as listed    Component Value Date/Time   NA 146 (H) 11/21/2018 1044   NA 142 06/08/2017 0832   K 3.7 11/21/2018 1044   K 3.9 06/08/2017 0832   CL 113 (H) 11/21/2018 1044   CL 110 (H) 11/14/2012 0918   CO2 23 11/21/2018 1044   CO2 25 06/08/2017 0832   GLUCOSE 118 (H) 11/21/2018 1044   GLUCOSE 98 06/08/2017 0832   GLUCOSE 108 (H) 11/14/2012 0918   BUN 12 11/21/2018 1044   BUN 11.6 06/08/2017 0832   CREATININE 0.95 11/21/2018 1044   CREATININE 1.31 (H) 03/30/2018 1218   CREATININE 0.9 06/08/2017 0832   CALCIUM 8.2 (L) 11/21/2018 1044   CALCIUM 8.6 06/08/2017 0832   PROT 6.3 (L) 11/21/2018 1044   PROT 6.7 06/08/2017 0832   ALBUMIN 3.1 (L) 11/21/2018 1044   ALBUMIN 3.4 (L) 06/08/2017 0832   AST 17 11/21/2018 1044   AST 13 06/08/2017 0832   ALT 21 11/21/2018 1044   ALT 7 06/08/2017 0832   ALKPHOS 116 11/21/2018 1044   ALKPHOS 96 06/08/2017 0832   BILITOT 0.9 11/21/2018 1044   BILITOT 0.44 06/08/2017 0832   GFRNONAA >60 11/21/2018 1044   GFRNONAA 53 (L) 03/30/2018 1218   GFRAA >60 11/21/2018 1044   GFRAA 62 03/30/2018 1218    No results found for: SPEP, UPEP  Lab Results  Component Value Date   WBC 6.7 11/21/2018   NEUTROABS 5.2 11/21/2018   HGB 11.7 (L) 11/21/2018   HCT 36.8 (L) 11/21/2018   MCV 95.8 11/21/2018   PLT 187 11/21/2018      Chemistry      Component Value Date/Time   NA 146 (H) 11/21/2018 1044   NA 142 06/08/2017 0832   K 3.7 11/21/2018 1044   K 3.9 06/08/2017 0832   CL 113 (H) 11/21/2018 1044   CL 110 (H) 11/14/2012 0918   CO2 23 11/21/2018 1044   CO2 25 06/08/2017 0832   BUN 12 11/21/2018 1044    BUN 11.6 06/08/2017 1610  CREATININE 0.95 11/21/2018 1044   CREATININE 1.31 (H) 03/30/2018 1218   CREATININE 0.9 06/08/2017 0832      Component Value Date/Time   CALCIUM 8.2 (L) 11/21/2018 1044   CALCIUM 8.6 06/08/2017 0832   ALKPHOS 116 11/21/2018 1044   ALKPHOS 96 06/08/2017 0832   AST 17 11/21/2018 1044   AST 13 06/08/2017 0832   ALT 21 11/21/2018 1044   ALT 7 06/08/2017 0832   BILITOT 0.9 11/21/2018 1044   BILITOT 0.44 06/08/2017 0832      All questions were answered. The patient knows to call the clinic with any problems, questions or concerns. No barriers to learning was detected.  I spent 15 minutes counseling the patient face to face. The total time spent in the appointment was 20 minutes and more than 50% was on counseling and review of test results  [name], MD 12/04/2018 8:41 AM

## 2018-12-04 NOTE — Assessment & Plan Note (Signed)
So far, he tolerated chemotherapy very well without major side effects I reviewed recent blood work and gave him a copy of the test results The patient has achieved complete response on chemotherapy The goal of treatment would be palliative and indefinite treatment withTasiigna due to relapse while on active surveillance He will continue treatment indefinitely I will see him back in 3 months for further follow-up

## 2018-12-04 NOTE — Assessment & Plan Note (Signed)
This is likely anemia of chronic disease. The patient denies recent history of bleeding such as epistaxis, hematuria or hematochezia. He is asymptomatic from the anemia. We will observe for now.  

## 2018-12-04 NOTE — Assessment & Plan Note (Signed)
He continues on aggressive medical management He has no recent exacerbation of congestive heart failure Continue medical management 

## 2018-12-11 LAB — BCR/ABL

## 2018-12-14 MED FILL — TASIGNA 150 MG CAPSULE: 150 | 28 days supply | Qty: 112 | Fill #3

## 2018-12-17 ENCOUNTER — Telehealth: Payer: Self-pay | Admitting: Nurse Practitioner

## 2018-12-17 ENCOUNTER — Other Ambulatory Visit: Payer: Self-pay

## 2018-12-17 ENCOUNTER — Telehealth: Payer: Self-pay

## 2018-12-17 ENCOUNTER — Encounter: Payer: Medicare HMO | Admitting: *Deleted

## 2018-12-17 ENCOUNTER — Other Ambulatory Visit: Payer: Self-pay | Admitting: Family Medicine

## 2018-12-17 NOTE — Telephone Encounter (Signed)
Follow up: ° ° ° °Patient returning your call back. Please call patient. °

## 2018-12-17 NOTE — Telephone Encounter (Signed)
Ok to refill Diazepam??  Last office visit 09/21/2018.  Last refill 11/19/2018.

## 2018-12-17 NOTE — Telephone Encounter (Signed)
Spoke with patient to remind of missed remote transmission 

## 2018-12-26 NOTE — Progress Notes (Signed)
Letter  

## 2019-01-10 MED FILL — TASIGNA 150 MG CAPSULE: 150 | 28 days supply | Qty: 112 | Fill #4

## 2019-01-29 DIAGNOSIS — H02834 Dermatochalasis of left upper eyelid: Secondary | ICD-10-CM | POA: Diagnosis not present

## 2019-01-29 DIAGNOSIS — H0102A Squamous blepharitis right eye, upper and lower eyelids: Secondary | ICD-10-CM | POA: Diagnosis not present

## 2019-01-29 DIAGNOSIS — H0102B Squamous blepharitis left eye, upper and lower eyelids: Secondary | ICD-10-CM | POA: Diagnosis not present

## 2019-01-29 DIAGNOSIS — H2513 Age-related nuclear cataract, bilateral: Secondary | ICD-10-CM | POA: Diagnosis not present

## 2019-01-29 DIAGNOSIS — H35033 Hypertensive retinopathy, bilateral: Secondary | ICD-10-CM | POA: Diagnosis not present

## 2019-01-29 DIAGNOSIS — H02831 Dermatochalasis of right upper eyelid: Secondary | ICD-10-CM | POA: Diagnosis not present

## 2019-01-29 DIAGNOSIS — H15113 Episcleritis periodica fugax, bilateral: Secondary | ICD-10-CM | POA: Diagnosis not present

## 2019-02-05 DIAGNOSIS — H02834 Dermatochalasis of left upper eyelid: Secondary | ICD-10-CM | POA: Diagnosis not present

## 2019-02-05 DIAGNOSIS — H02831 Dermatochalasis of right upper eyelid: Secondary | ICD-10-CM | POA: Diagnosis not present

## 2019-02-05 DIAGNOSIS — H0102B Squamous blepharitis left eye, upper and lower eyelids: Secondary | ICD-10-CM | POA: Diagnosis not present

## 2019-02-05 DIAGNOSIS — H15113 Episcleritis periodica fugax, bilateral: Secondary | ICD-10-CM | POA: Diagnosis not present

## 2019-02-05 DIAGNOSIS — H2513 Age-related nuclear cataract, bilateral: Secondary | ICD-10-CM | POA: Diagnosis not present

## 2019-02-05 DIAGNOSIS — H0102A Squamous blepharitis right eye, upper and lower eyelids: Secondary | ICD-10-CM | POA: Diagnosis not present

## 2019-02-06 MED FILL — TASIGNA 150 MG CAPSULE: 150 | 28 days supply | Qty: 112 | Fill #5

## 2019-02-12 ENCOUNTER — Telehealth: Payer: Self-pay

## 2019-02-12 NOTE — Telephone Encounter (Signed)
Pt states he got a letter to make an appointment with Dr. Caryl Comes. I asked him did he call the main number to speak with a scheduler? He states he did but was caller number [phone] and did not feel like waiting to speak with someone. I told him I will let the scheduler know to make the appointment. She will give him a call back. I told him it may be a week before he get the call.

## 2019-02-25 ENCOUNTER — Inpatient Hospital Stay: Payer: Medicare HMO | Attending: Hematology and Oncology

## 2019-02-25 DIAGNOSIS — C9210 Chronic myeloid leukemia, BCR/ABL-positive, not having achieved remission: Secondary | ICD-10-CM | POA: Insufficient documentation

## 2019-03-06 MED FILL — TASIGNA 150 MG CAPSULE: 150 | 28 days supply | Qty: 112 | Fill #6

## 2019-03-07 ENCOUNTER — Other Ambulatory Visit: Payer: Self-pay

## 2019-03-07 ENCOUNTER — Inpatient Hospital Stay: Payer: Medicare HMO | Admitting: Hematology and Oncology

## 2019-03-07 ENCOUNTER — Inpatient Hospital Stay: Payer: Medicare HMO

## 2019-03-07 DIAGNOSIS — C9210 Chronic myeloid leukemia, BCR/ABL-positive, not having achieved remission: Secondary | ICD-10-CM

## 2019-03-07 LAB — CBC WITH DIFFERENTIAL/PLATELET
Abs Immature Granulocytes: 0.01 10*3/uL (ref 0.00–0.07)
Basophils Absolute: 0.1 10*3/uL (ref 0.0–0.1)
Basophils Relative: 1 %
Eosinophils Absolute: 0.1 10*3/uL (ref 0.0–0.5)
Eosinophils Relative: 2 %
HCT: 34.1 % — ABNORMAL LOW (ref 39.0–52.0)
Hemoglobin: 10.8 g/dL — ABNORMAL LOW (ref 13.0–17.0)
Immature Granulocytes: 0 %
Lymphocytes Relative: 15 %
Lymphs Abs: 0.8 10*3/uL (ref 0.7–4.0)
MCH: 31.1 pg (ref 26.0–34.0)
MCHC: 31.7 g/dL (ref 30.0–36.0)
MCV: 98.3 fL (ref 80.0–100.0)
Monocytes Absolute: 0.4 10*3/uL (ref 0.1–1.0)
Monocytes Relative: 8 %
Neutro Abs: 4.1 10*3/uL (ref 1.7–7.7)
Neutrophils Relative %: 74 %
Platelets: 179 10*3/uL (ref 150–400)
RBC: 3.47 MIL/uL — ABNORMAL LOW (ref 4.22–5.81)
RDW: 15.4 % (ref 11.5–15.5)
WBC: 5.6 10*3/uL (ref 4.0–10.5)
nRBC: 0 % (ref 0.0–0.2)

## 2019-03-07 LAB — COMPREHENSIVE METABOLIC PANEL
ALT: 10 U/L (ref 0–44)
AST: 12 U/L — ABNORMAL LOW (ref 15–41)
Albumin: 3 g/dL — ABNORMAL LOW (ref 3.5–5.0)
Alkaline Phosphatase: 123 U/L (ref 38–126)
Anion gap: 6 (ref 5–15)
BUN: 9 mg/dL (ref 8–23)
CO2: 25 mmol/L (ref 22–32)
Calcium: 8.2 mg/dL — ABNORMAL LOW (ref 8.9–10.3)
Chloride: 112 mmol/L — ABNORMAL HIGH (ref 98–111)
Creatinine, Ser: 0.83 mg/dL (ref 0.61–1.24)
GFR calc Af Amer: >60 mL/min (ref >60)
GFR calc non Af Amer: >60 mL/min (ref >60)
Glucose, Bld: 127 mg/dL — ABNORMAL HIGH (ref 70–99)
Potassium: 3.8 mmol/L (ref 3.5–5.1)
Sodium: 143 mmol/L (ref 135–145)
Total Bilirubin: 1 mg/dL (ref 0.3–1.2)
Total Protein: 6 g/dL — ABNORMAL LOW (ref 6.5–8.1)

## 2019-03-07 LAB — MAGNESIUM: Magnesium: 1.9 mg/dL (ref 1.7–2.4)

## 2019-03-16 ENCOUNTER — Other Ambulatory Visit: Payer: Self-pay | Admitting: Family Medicine

## 2019-03-18 ENCOUNTER — Inpatient Hospital Stay: Payer: Medicare HMO | Attending: Hematology and Oncology | Admitting: Hematology and Oncology

## 2019-03-18 ENCOUNTER — Ambulatory Visit (INDEPENDENT_AMBULATORY_CARE_PROVIDER_SITE_OTHER): Payer: Medicare HMO | Admitting: *Deleted

## 2019-03-18 ENCOUNTER — Other Ambulatory Visit: Payer: Self-pay

## 2019-03-18 DIAGNOSIS — C9210 Chronic myeloid leukemia, BCR/ABL-positive, not having achieved remission: Secondary | ICD-10-CM

## 2019-03-18 DIAGNOSIS — D6481 Anemia due to antineoplastic chemotherapy: Secondary | ICD-10-CM | POA: Diagnosis not present

## 2019-03-18 DIAGNOSIS — C9211 Chronic myeloid leukemia, BCR/ABL-positive, in remission: Secondary | ICD-10-CM | POA: Diagnosis not present

## 2019-03-18 DIAGNOSIS — I255 Ischemic cardiomyopathy: Secondary | ICD-10-CM | POA: Diagnosis not present

## 2019-03-18 DIAGNOSIS — T451X5A Adverse effect of antineoplastic and immunosuppressive drugs, initial encounter: Secondary | ICD-10-CM

## 2019-03-18 DIAGNOSIS — I472 Ventricular tachycardia, unspecified: Secondary | ICD-10-CM

## 2019-03-19 ENCOUNTER — Encounter: Payer: Self-pay | Admitting: Hematology and Oncology

## 2019-03-19 LAB — CUP PACEART REMOTE DEVICE CHECK
Battery Remaining Longevity: 85 mo
Battery Voltage: 2.99 V
Brady Statistic RV Percent Paced: 0.23 %
Date Time Interrogation Session: 20200810073424
HighPow Impedance: 53 Ohm
Implantable Lead Implant Date: 20151014
Implantable Lead Location: 753860
Implantable Pulse Generator Implant Date: 20151014
Lead Channel Impedance Value: 361 Ohm
Lead Channel Impedance Value: 399 Ohm
Lead Channel Pacing Threshold Amplitude: 0.75 V
Lead Channel Pacing Threshold Pulse Width: 0.4 ms
Lead Channel Sensing Intrinsic Amplitude: 8.75 mV
Lead Channel Sensing Intrinsic Amplitude: 8.75 mV
Lead Channel Setting Pacing Amplitude: 2 V
Lead Channel Setting Pacing Pulse Width: 0.4 ms
Lead Channel Setting Sensing Sensitivity: 0.3 mV

## 2019-03-19 LAB — BCR/ABL

## 2019-03-19 NOTE — Progress Notes (Signed)
[facility] OFFICE PROGRESS NOTE  Patient Care Team: [name], MD as PCP - General (Family Medicine) [name]., MD as Attending Physician (Cardiology) [name], MD (Family Medicine) [name], MD (Cardiothoracic Surgery) Early, Arvilla Meres, MD as Attending Physician (Vascular Surgery) [name], MD as Consulting Physician (Hematology and Oncology) Deboraha Sprang, MD as Consulting Physician (Cardiology)  ASSESSMENT & PLAN:  Chronic myelogenous leukemia So far, he tolerated chemotherapy very well without major side effects I reviewed recent blood work and gave him a copy of the test results The patient has achieved complete response on chemotherapy The goal of treatment would be palliative and indefinite treatment withTasigna due to relapse while on active surveillance He will continue treatment indefinitely I will see him back in 3 months for further follow-up  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 The patient denies recent history of bleeding such as epistaxis, hematuria or hematochezia. He is asymptomatic from the anemia. We will observe for now.  Cardiomyopathy, ischemic He continues on aggressive medical management He has no recent exacerbation of congestive heart failure Continue medical management   No orders of the defined types were placed in this encounter.   INTERVAL HISTORY: Please see below for problem oriented charting. He returns for further follow-up He feels well No recent infection, fever or chills Denies recent exacerbation of congestive heart failure He tolerated treatment well Denies diarrhea  SUMMARY OF ONCOLOGIC HISTORY: Oncology History  Chronic myelogenous leukemia ([place])  05/04/2012 Bone Marrow Biopsy   BM confirmed diagnosis of CML in Chronic phase. BCR/ABL by PCR detected abnormalitis with b2a2 & b3a2 subtypes   05/09/2012 - 03/16/2015 Chemotherapy   He  was started on treatment with Dasatinib 100 mg daily   06/06/2012 Adverse Reaction   Dose of medication was reduced to 50 mg daily due to pancytopenia   01/24/2013 Progression   Patient was noted to have elevated blood count which and was subsequently found to be noncompliant to treatment. The patient has not been on treatment for several months because his prescription ran out. He was restarted back on treatment   01/16/2014 Progression   Bcr/ABL by PCR is worse. Dose of Dasatinib was increased to 100 mg.   02/26/2014 Tumor Marker   Blood work for ABL kinase mutation was negative.   10/29/2014 Tumor Marker   BCR/ABL b2a2 & b3a2 0.29%, IS 0.1624%, not in MMR yet but improving   11/05/2014 Adverse Reaction   He had thoracentesis due to pleural effusion   11/27/2014 Surgery   He underwent right video-assisted thoracoscopy, Drainage of pleural effusion, Pleural biopsy, Diaphragm biopsy, Lung biopsy & [place]c pleurodesis   01/30/2015 Tumor Marker   BCR/ABL b2a2 & b3a2 0.78%, IS 0.4368%, not in MMR    03/11/2015 Pathology Results   BCR/ABL b2a2 0.19%, IS 0.1064%, not in MMR    03/16/2015 Adverse Reaction   Dasatinib was stopped due to recurrent pleural effusion   03/28/2015 - 11/06/2017 Chemotherapy   He started on Bosutinib   03/30/2015 Procedure   He has therapeutic thoracentesis of the right lung with 1 liter of fluid removed   03/30/2015 Adverse Reaction   Bosutinib is placed on hold, to be restart on 8/25 at 250 mg due to severe diarrhea   04/28/2015 Tumor Marker   BCR/ABL b2a2 0.06%, IS 0.0336%, in MMR    06/10/2015 Tumor Marker   BCR/ABL b2a2 0.14%, IS 0.1162%, not in MMR  08/14/2015 Pathology Results   BCR/ABL b2a2 0.02%, IS 0.0166%, In MMR    11/09/2015 Tumor Marker   BCR/ABL b2a2 0.005%, IS 0.0042%, In MMR    02/08/2016 Tumor Marker   BCR/ABL undetectable. In MMR   06/13/2016 Tumor Marker   BCR/ABL undetectable. In MMR   12/06/2016 Pathology Results   BCR/ABL undetectable. In  MMR   06/08/2017 Pathology Results   BCR/ABL undetectable. In MMR   09/08/2017 Pathology Results   BCR/ABL undetectable. In MMR   12/11/2017 Pathology Results   BCR/ABL undetectable. In MMR   03/19/2018 Pathology Results   BCR/ABL undetectable. In MMR   06/12/2018 Pathology Results   BCR/ABL undetectable. In MMR   09/06/2018 Pathology Results   BCR ABL is detectable. e13a2 (b2a2) and [id number]) IS: 0.009%   09/26/2018 -  Chemotherapy   The patient had Tasigna    11/21/2018 Pathology Results   BCR/ABL undetectable. In MMR   03/07/2019 Pathology Results   BCR/ABL undetectable. In MMR     REVIEW OF SYSTEMS:   Constitutional: Denies fevers, chills or abnormal weight loss Eyes: Denies blurriness of vision Ears, nose, mouth, throat, and face: Denies mucositis or sore throat Respiratory: Denies cough, dyspnea or wheezes Cardiovascular: Denies palpitation, chest discomfort or lower extremity swelling Gastrointestinal:  Denies nausea, heartburn or change in bowel habits Skin: Denies abnormal skin rashes Lymphatics: Denies new lymphadenopathy or easy bruising Neurological:Denies numbness, tingling or new weaknesses Behavioral/Psych: Mood is stable, no new changes  All other systems were reviewed with the patient and are negative.  I have reviewed the past medical history, past surgical history, social history and family history with the patient and they are unchanged from previous note.  ALLERGIES:  is allergic to norvasc [amlodipine besylate] and sulfa antibiotics.  MEDICATIONS:  Current Outpatient Medications  Medication Sig Dispense Refill  . albuterol (PROVENTIL HFA;VENTOLIN HFA) 108 (90 Base) MCG/ACT inhaler Inhale 2 puffs into the lungs every 6 (six) hours as needed for wheezing or shortness of breath. 1 Inhaler 0  . atorvastatin (LIPITOR) 80 MG tablet TAKE 1 TABLET BY MOUTH ONCE DAILY AT  6 PM. 90 tablet 1  . carvedilol (COREG) 12.5 MG tablet Take 1 tablet (12.5 mg total) by  mouth 2 (two) times daily. 180 tablet 3  . clopidogrel (PLAVIX) 75 MG tablet TAKE 1 TABLET BY MOUTH DAILY 90 tablet 3  . diazepam (VALIUM) 10 MG tablet TAKE 1/2 TO 1 (ONE-HALF TO ONE) TABLET BY MOUTH EVERY 8 HOURS AS NEEDED FOR MUSCLE SPASM OR  ANXIETY 30 tablet 0  . mexiletine (MEXITIL) 200 MG capsule Take 1 capsule (200 mg total) by mouth 2 (two) times daily. 180 capsule 3  . nilotinib (TASIGNA) 150 MG capsule Take 2 capsules (300 mg total) by mouth every 12 (twelve) hours. Take on an empty stomach, 1 hr before or 2 hrs after food. 120 capsule 10  . nitroGLYCERIN (NITROSTAT) 0.4 MG SL tablet DISSOLVE 1 TABLET UNDER THE TONGUE EVERY 5 MINUTES AS NEEDED FOR CHEST PAIN 25 tablet 4  . sildenafil (VIAGRA) 100 MG tablet Take 0.5-1 tablets (50-100 mg total) by mouth daily as needed for erectile dysfunction. 5 tablet 11  . valsartan (DIOVAN) 160 MG tablet TAKE 1 TABLET BY MOUTH ONCE DAILY *DISCONTINUE  ENTRESTO* 90 tablet 0   No current facility-administered medications for this visit.     PHYSICAL EXAMINATION: ECOG PERFORMANCE STATUS: 0 - Asymptomatic  Vitals:   03/18/19 1004  BP: 138/81  Pulse: 75  Resp: 18  Temp:  99.2 F (37.3 C)  SpO2: 98%   Filed Weights   03/18/19 1004  Weight: 167 lb 3.2 oz (75.8 kg)    GENERAL:alert, no distress and comfortable SKIN: skin color, texture, turgor are normal, no rashes or significant lesions EYES: normal, Conjunctiva are pink and non-injected, sclera clear OROPHARYNX:no exudate, no erythema and lips, buccal mucosa, and tongue normal  NECK: supple, thyroid normal size, non-tender, without nodularity LYMPH:  no palpable lymphadenopathy in the cervical, axillary or inguinal LUNGS: clear to auscultation and percussion with normal breathing effort HEART: regular rate & rhythm and no murmurs and no lower extremity edema ABDOMEN:abdomen soft, non-tender and normal bowel sounds Musculoskeletal:no cyanosis of digits and no clubbing  NEURO: alert &  oriented x 3 with fluent speech, no focal motor/sensory deficits  LABORATORY DATA:  I have reviewed the data as listed    Component Value Date/Time   NA 143 03/07/2019 0946   NA 142 06/08/2017 0832   K 3.8 03/07/2019 0946   K 3.9 06/08/2017 0832   CL 112 (H) 03/07/2019 0946   CL 110 (H) 11/14/2012 0918   CO2 25 03/07/2019 0946   CO2 25 06/08/2017 0832   GLUCOSE 127 (H) 03/07/2019 0946   GLUCOSE 98 06/08/2017 0832   GLUCOSE 108 (H) 11/14/2012 0918   BUN 9 03/07/2019 0946   BUN 11.6 06/08/2017 0832   CREATININE 0.83 03/07/2019 0946   CREATININE 1.31 (H) 03/30/2018 1218   CREATININE 0.9 06/08/2017 0832   CALCIUM 8.2 (L) 03/07/2019 0946   CALCIUM 8.6 06/08/2017 0832   PROT 6.0 (L) 03/07/2019 0946   PROT 6.7 06/08/2017 0832   ALBUMIN 3.0 (L) 03/07/2019 0946   ALBUMIN 3.4 (L) 06/08/2017 0832   AST 12 (L) 03/07/2019 0946   AST 13 06/08/2017 0832   ALT 10 03/07/2019 0946   ALT 7 06/08/2017 0832   ALKPHOS 123 03/07/2019 0946   ALKPHOS 96 06/08/2017 0832   BILITOT 1.0 03/07/2019 0946   BILITOT 0.44 06/08/2017 0832   GFRNONAA >60 03/07/2019 0946   GFRNONAA 53 (L) 03/30/2018 1218   GFRAA >60 03/07/2019 0946   GFRAA 62 03/30/2018 1218    No results found for: SPEP, UPEP  Lab Results  Component Value Date   WBC 5.6 03/07/2019   NEUTROABS 4.1 03/07/2019   HGB 10.8 (L) 03/07/2019   HCT 34.1 (L) 03/07/2019   MCV 98.3 03/07/2019   PLT 179 03/07/2019      Chemistry      Component Value Date/Time   NA 143 03/07/2019 0946   NA 142 06/08/2017 0832   K 3.8 03/07/2019 0946   K 3.9 06/08/2017 0832   CL 112 (H) 03/07/2019 0946   CL 110 (H) 11/14/2012 0918   CO2 25 03/07/2019 0946   CO2 25 06/08/2017 0832   BUN 9 03/07/2019 0946   BUN 11.6 06/08/2017 0832   CREATININE 0.83 03/07/2019 0946   CREATININE 1.31 (H) 03/30/2018 1218   CREATININE 0.9 06/08/2017 0832      Component Value Date/Time   CALCIUM 8.2 (L) 03/07/2019 0946   CALCIUM 8.6 06/08/2017 0832   ALKPHOS 123  03/07/2019 0946   ALKPHOS 96 06/08/2017 0832   AST 12 (L) 03/07/2019 0946   AST 13 06/08/2017 0832   ALT 10 03/07/2019 0946   ALT 7 06/08/2017 0832   BILITOT 1.0 03/07/2019 0946   BILITOT 0.44 06/08/2017 0832       All questions were answered. The patient knows to call the clinic with any  problems, questions or concerns. No barriers to learning was detected.  I spent 15 minutes counseling the patient face to face. The total time spent in the appointment was 20 minutes and more than 50% was on counseling and review of test results  [name], MD 03/19/2019 4:23 PM

## 2019-03-19 NOTE — Assessment & Plan Note (Signed)
So far, he tolerated chemotherapy very well without major side effects I reviewed recent blood work and gave him a copy of the test results The patient has achieved complete response on chemotherapy The goal of treatment would be palliative and indefinite treatment withTasigna due to relapse while on active surveillance He will continue treatment indefinitely I will see him back in 3 months for further follow-up

## 2019-03-19 NOTE — Assessment & Plan Note (Signed)
He continues on aggressive medical management He has no recent exacerbation of congestive heart failure Continue medical management 

## 2019-03-19 NOTE — Assessment & Plan Note (Signed)
This is likely anemia of chronic disease. The patient denies recent history of bleeding such as epistaxis, hematuria or hematochezia. He is asymptomatic from the anemia. We will observe for now.  

## 2019-03-25 ENCOUNTER — Encounter: Payer: Self-pay | Admitting: Family Medicine

## 2019-03-25 ENCOUNTER — Ambulatory Visit (INDEPENDENT_AMBULATORY_CARE_PROVIDER_SITE_OTHER): Payer: Medicare HMO | Admitting: Family Medicine

## 2019-03-25 ENCOUNTER — Other Ambulatory Visit: Payer: Self-pay

## 2019-03-25 VITALS — BP 142/68 | HR 78 | Temp 98.3°F | Resp 16 | Ht 69.0 in | Wt 169.0 lb

## 2019-03-25 DIAGNOSIS — H6192 Disorder of left external ear, unspecified: Secondary | ICD-10-CM | POA: Diagnosis not present

## 2019-03-25 DIAGNOSIS — H6981 Other specified disorders of Eustachian tube, right ear: Secondary | ICD-10-CM | POA: Diagnosis not present

## 2019-03-25 DIAGNOSIS — D485 Neoplasm of uncertain behavior of skin: Secondary | ICD-10-CM | POA: Diagnosis not present

## 2019-03-25 DIAGNOSIS — M79641 Pain in right hand: Secondary | ICD-10-CM | POA: Diagnosis not present

## 2019-03-25 MED ORDER — CETIRIZINE HCL 10 MG PO TABS: 10.0000 mg | ORAL_TABLET | Freq: Every day | ORAL | 0 refills | Status: DC

## 2019-03-25 MED ORDER — FLUTICASONE PROPIONATE 50 MCG/ACT NA SUSP: 2.0000 | Freq: Every day | NASAL | 0 refills | Status: DC

## 2019-03-25 NOTE — Progress Notes (Signed)
Subjective:    Patient ID: Anthony Wolfe, male    DOB: 1945/08/25, 73 y.o.   MRN: [id number]  Patient presents with 3 concerns.  #1 he reports 2 weeks of pain in his right ear.  His right ear feels stopped up.  Whenever he talks or chews or walks he will feel a popping sensation in his ear as though is trying to relieve pressure.  His ear feels stopped up like when 1 is flying in an airplane or driving in the mountains.  He is unable to equalize the pressure.  This causes pain in his ear.  He also reports head congestion and sinus congestion.  He states that his entire head feels stopped up.  He denies any sinus pain or sinus pressure or fever or headache.  Examination of the right auditory canal reveals no obstruction no cerumen impaction.  There is no inflammation.  The tympanic membrane is pearly gray with no evidence of otitis media or middle ear effusion.  Second issue is a lesion growing on the superior helix of his left ear.  It is a hyperkeratotic papule roughly 4 mm in diameter with a white scaly cutaneous horn growing in the center of it.  This appears to be either an early squamous cell carcinoma or keratoacanthoma.  Given its size I believe it would be amenable to cryotherapy.  He states is only been present for 6 weeks.  Third issue is pain in his right hand.  He reports pain on the palmar aspect of the right third and fourth metacarpals.  He states that it hurts to bend his fingers.  He reports shooting pain radiating in his hand.  He denies any trigger finger.  There is no evidence of Dupuytren's contracture.  The pain sounds like it may be secondary to carpal tunnel as it is almost neuropathic in nature. Past Medical History:  Diagnosis Date  . AICD (automatic cardioverter/defibrillator) present   . Allergic rhinitis   . 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 Anxiety   . Blood dyscrasia    cmleukemia  . C. difficile colitis 06/16/2014  . CAD (coronary artery disease)    a. s/p 3v  CABG 2010. b. NSTEMI 05/2014 in setting of VT. Cath impressions: "Recent IMI with occluded SVG to RCA. Has Right to Right collaterals and collaterals from septal and OM. EF 40%."  . Carotid disease, bilateral ([place])    a. Right CEA 2002; known occluded left carotid. b. Duplex 06/2014: known occluded LICA, stable 2-58% RICA s/p CEA.  . Chronic systolic CHF (congestive heart failure) ([place])   . CML (chronic myelocytic leukemia) ([place]) 05/09/2012  . Depression   . Emphysema of lung ([place])   . HTN (hypertension)   . Hyperlipidemia   . Ischemic cardiomyopathy    a. Prior EF 36%. b. 2014: 50%. c. 05/2014: EF 35-40% by echo, 40% with inf HK by cath.  . NSTEMI (non-ST elevation myocardial infarction) ([place]) 05/2014  . Pleural effusion 05/2014  . Pneumonia 05/23/2014  . Presence of permanent cardiac pacemaker   . Prostate hypertrophy   . PVC's (premature ventricular contractions)    a. PVC's with prior Holter showing PVC load of 22%.  . Shortness of breath dyspnea   . Stroke ([place]) 01  . TIA (transient ischemic attack)    ASPVD, S./P. right CEA, 2002, and known occluded left carotid  . Ventricular tachycardia ([place])    a. Admitted with VT 05/2013 - EPS with inducible sustained monomorphic  VT; s/p Medtronic ICD 05/21/14.   Past Surgical History:  Procedure Laterality Date  . BACK SURGERY    . CAROTID ENDARTERECTOMY Right 01  . carotidectomy  2003   right side  . COLONOSCOPY  07/2010  . CORONARY ARTERY BYPASS GRAFT  02/2009   3 vessel  . ELECTROPHYSIOLOGIC STUDY  05/21/14  . ELECTROPHYSIOLOGY STUDY N/A 05/21/2014   Procedure: ELECTROPHYSIOLOGY STUDY;  Surgeon: Deboraha Sprang, MD;  Location[facility] CATH LAB;  Service: Cardiovascular;  Laterality: N/A;  . EP IMPLANTABLE DEVICE  05/21/14   single chamber Metronic ICD  . LEFT HEART CATHETERIZATION WITH CORONARY/GRAFT ANGIOGRAM N/A 05/19/2014   Procedure: LEFT HEART CATHETERIZATION WITH Beatrix Fetters;  Surgeon: Josue Hector, MD;  Location: [facility]  CATH LAB;  Service: Cardiovascular;  Laterality: N/A;  . PLEURAL BIOPSY N/A 11/27/2014   Procedure: PLEURAL BIOPSY;  Surgeon: [name], MD;  Location: [facility];  Service: Thoracic;  Laterality: N/A;  . PLEURAL EFFUSION DRAINAGE Right 11/27/2014   Procedure: DRAINAGE OF PLEURAL EFFUSION;  Surgeon: [name], MD;  Location: [facility];  Service: Thoracic;  Laterality: Right;  . TALC PLEURODESIS Right 11/27/2014   Procedure: Pietro Cassis;  Surgeon: [name], MD;  Location: [facility];  Service: Thoracic;  Laterality: Right;  Marland Kitchen VIDEO ASSISTED THORACOSCOPY Right 11/27/2014   Procedure: RIGHT VIDEO ASSISTED THORACOSCOPY;  Surgeon: [name], MD;  Location: [facility];  Service: Thoracic;  Laterality: Right;   Current Outpatient Medications on File Prior to Visit  Medication Sig Dispense Refill  . albuterol (PROVENTIL HFA;VENTOLIN HFA) 108 (90 Base) MCG/ACT inhaler Inhale 2 puffs into the lungs every 6 (six) hours as needed for wheezing or shortness of breath. 1 Inhaler 0  . atorvastatin (LIPITOR) 80 MG tablet TAKE 1 TABLET BY MOUTH ONCE DAILY AT  6 PM. 90 tablet 1  . clopidogrel (PLAVIX) 75 MG tablet TAKE 1 TABLET BY MOUTH DAILY 90 tablet 3  . diazepam (VALIUM) 10 MG tablet TAKE 1/2 TO 1 (ONE-HALF TO ONE) TABLET BY MOUTH EVERY 8 HOURS AS NEEDED FOR MUSCLE SPASM OR  ANXIETY 30 tablet 0  . mexiletine (MEXITIL) 200 MG capsule Take 1 capsule (200 mg total) by mouth 2 (two) times daily. 180 capsule 3  . nilotinib (TASIGNA) 150 MG capsule Take 2 capsules (300 mg total) by mouth every 12 (twelve) hours. Take on an empty stomach, 1 hr before or 2 hrs after food. 120 capsule 10  . nitroGLYCERIN (NITROSTAT) 0.4 MG SL tablet DISSOLVE 1 TABLET UNDER THE TONGUE EVERY 5 MINUTES AS NEEDED FOR CHEST PAIN 25 tablet 4  . sildenafil (VIAGRA) 100 MG tablet Take 0.5-1 tablets (50-100 mg total) by mouth daily as needed for erectile dysfunction. 5 tablet 11  . valsartan (DIOVAN) 160 MG tablet TAKE  1 TABLET BY MOUTH ONCE DAILY *DISCONTINUE  ENTRESTO* 90 tablet 0  . carvedilol (COREG) 12.5 MG tablet Take 1 tablet (12.5 mg total) by mouth 2 (two) times daily. 180 tablet 3   No current facility-administered medications on file prior to visit.    Allergies  Allergen Reactions  . Norvasc [Amlodipine Besylate] Rash    rash  . Sulfa Antibiotics Hives    Hives    Social History   Socioeconomic History  . Marital status: Married    Spouse name: Not on file  . Number of children: 2  . Years of education: Not on file  . Highest education level: Not on file  Occupational History  Comment: retired Ship broker  . Financial resource strain: Not on file  . Food insecurity    Worry: Not on file    Inability: Not on file  . Transportation needs    Medical: Not on file    Non-medical: Not on file  Tobacco Use  . Smoking status: Former Smoker    Packs/day: 3.00    Years: 45.00    Pack years: 135.00    Types: Cigarettes    Quit date: 08/08/1997    Years since quitting: 21.6  . Smokeless tobacco: Former Network engineer and Sexual Activity  . Alcohol use: No    Alcohol/week: 0.0 standard drinks  . Drug use: No  . Sexual activity: Yes  Lifestyle  . Physical activity    Days per week: Not on file    Minutes per session: Not on file  . Stress: Not on file  Relationships  . Social Herbalist on phone: Not on file    Gets together: Not on file    Attends religious service: Not on file    Active member of club or organization: Not on file    Attends meetings of clubs or organizations: Not on file    Relationship status: Not on file  . Intimate partner violence    Fear of current or ex partner: Not on file    Emotionally abused: Not on file    Physically abused: Not on file    Forced sexual activity: Not on file  Other Topics Concern  . Not on file  Social History Narrative  . Not on file      Review of Systems  Gastrointestinal: Positive for  diarrhea.  All other systems reviewed and are negative.      Objective:   Physical Exam  Constitutional: He appears well-developed and well-nourished.  HENT:  Ears:  Cardiovascular: Normal rate, regular rhythm and normal heart sounds.  No murmur heard. Pulmonary/Chest: Effort normal. No respiratory distress. He has no decreased breath sounds. He has no wheezes. He has no rales.  Musculoskeletal:     Right hand: He exhibits tenderness. He exhibits normal range of motion and no bony tenderness. Normal sensation noted. Decreased sensation is not present in the ulnar distribution and is not present in the medial distribution. Decreased strength noted.       Hands:  Vitals reviewed.         Assessment & Plan:  1. Right hand pain Pain and weakness is atypical but I suspect possible carpal tunnel syndrome.  Recommended a consultation with orthopedics to discuss possible cortisone injection in the carpal tunnel to see if it would alleviate the pain - Ambulatory referral to Hand Surgery  2. Dysfunction of right eustachian tube Recommended Flonase 2 sprays each nostril once daily coupled with Zyrtec 10 mg daily along with Valsalva exercises and see if symptoms improve over the next 2 weeks  3. Skin lesion of left ear Treated with cryotherapy for 30 seconds using liquid nitrogen.  If lesion does not resolve completely would recommend dermatology consultation

## 2019-03-27 NOTE — Progress Notes (Signed)
Remote ICD transmission.   

## 2019-03-27 NOTE — Progress Notes (Signed)
Electrophysiology Office Note Date: 03/28/2019  ID:  Anthony Wolfe, Anthony Wolfe [birth year], MRN [id number]  PCP: [name], MD Electrophysiologist: Caryl Comes  CC: Routine ICD follow-up  Anthony Wolfe is a 73 y.o. male seen today for Dr Caryl Comes.  He presents today for routine electrophysiology followup.  Since last being seen in our clinic, the patient reports doing very well. He remains active in his garden and is looking forward to hunting this year. He had one episode of syncope a couple of weeks ago after jumping up quickly from the couch.  He has not had recurrence with getting up slowly. He denies chest pain, palpitations, dyspnea, PND, orthopnea, nausea, vomiting, dizziness, edema, weight gain, or early satiety.  He has not had ICD shocks.   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 difficile colitis 06/16/2014   CAD (coronary artery disease)    a. s/p 3v CABG 2010. b. NSTEMI 05/2014 in setting of VT. Cath impressions: "Recent IMI with occluded SVG to RCA. Has Right to Right collaterals and collaterals from septal and OM. EF 40%."   Carotid disease, bilateral ([place])    a. Right CEA 2002; known occluded left carotid. b. Duplex 06/2014: known occluded LICA, stable 7-74% RICA s/p CEA.   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 Prior EF 36%. b. 2014: 50%. c. 05/2014: EF 35-40% by echo, 40% with inf HK by cath.   NSTEMI (non-ST elevation myocardial infarction) ([place]) 05/2014    Pleural effusion 05/2014   Pneumonia 05/23/2014   Presence of permanent cardiac pacemaker    Prostate hypertrophy    PVC's (premature ventricular contractions)    a. PVC's with prior Holter showing PVC load of 22%.   Shortness of breath dyspnea    Stroke ([place]) 01   TIA (transient ischemic attack)    ASPVD, S./P. right CEA, 2002, and known occluded left carotid   Ventricular tachycardia ([place])    a. Admitted with VT 05/2013 - EPS with inducible sustained monomorphic VT; s/p Medtronic ICD 05/21/14.   Past Surgical History:  Procedure Laterality Date   BACK SURGERY     CAROTID ENDARTERECTOMY Right 01   carotidectomy  2003   right side   COLONOSCOPY  07/2010   CORONARY ARTERY BYPASS GRAFT  02/2009   3 vessel   ELECTROPHYSIOLOGIC STUDY  05/21/14   ELECTROPHYSIOLOGY STUDY N/A 05/21/2014   Procedure: ELECTROPHYSIOLOGY STUDY;  Surgeon: Deboraha Sprang, MD;  Location: [facility] CATH LAB;  Service: Cardiovascular;  Laterality: N/A;   EP IMPLANTABLE DEVICE  05/21/14   single chamber Metronic ICD   LEFT HEART CATHETERIZATION WITH CORONARY/GRAFT ANGIOGRAM N/A 05/19/2014   Procedure: LEFT HEART CATHETERIZATION WITH Beatrix Fetters;  Surgeon: Josue Hector, MD;  Location: [facility] CATH LAB;  Service: Cardiovascular;  Laterality: N/A;   PLEURAL BIOPSY N/A 11/27/2014   Procedure: PLEURAL BIOPSY;  Surgeon: [name], MD;  Location: [place];  Service: Thoracic;  Laterality:  N/A;   PLEURAL EFFUSION DRAINAGE Right 11/27/2014   Procedure: DRAINAGE OF PLEURAL EFFUSION;  Surgeon: [name], MD;  Location: [place];  Service: Thoracic;  Laterality: Right;   TALC PLEURODESIS Right 11/27/2014   Procedure: Pietro Cassis;  Surgeon: [name], MD;  Location: [place];  Service: Thoracic;  Laterality: Right;   VIDEO ASSISTED THORACOSCOPY Right 11/27/2014   Procedure: RIGHT VIDEO ASSISTED THORACOSCOPY;  Surgeon: [name], MD;  Location: [place];  Service:  Thoracic;  Laterality: Right;    Current Outpatient Medications  Medication Sig Dispense Refill   albuterol (PROVENTIL HFA;VENTOLIN HFA) 108 (90 Base) MCG/ACT inhaler Inhale 2 puffs into the lungs every 6 (six) hours as needed for wheezing or shortness of breath. 1 Inhaler 0   atorvastatin (LIPITOR) 80 MG tablet TAKE 1 TABLET BY MOUTH ONCE DAILY AT  6 PM. 90 tablet 1   carvedilol (COREG) 12.5 MG tablet Take 1 tablet (12.5 mg total) by mouth 2 (two) times daily. 180 tablet 3   cetirizine (ZYRTEC) 10 MG tablet Take 1 tablet (10 mg total) by mouth daily. 30 tablet 0   clopidogrel (PLAVIX) 75 MG tablet TAKE 1 TABLET BY MOUTH DAILY 90 tablet 3   diazepam (VALIUM) 10 MG tablet TAKE 1/2 TO 1 (ONE-HALF TO ONE) TABLET BY MOUTH EVERY 8 HOURS AS NEEDED FOR MUSCLE SPASM OR  ANXIETY 30 tablet 0   fluticasone (FLONASE) 50 MCG/ACT nasal spray Place 2 sprays into both nostrils daily. 16 g 0   mexiletine (MEXITIL) 200 MG capsule Take 1 capsule (200 mg total) by mouth 2 (two) times daily. 180 capsule 3   nilotinib (TASIGNA) 150 MG capsule Take 2 capsules (300 mg total) by mouth every 12 (twelve) hours. Take on an empty stomach, 1 hr before or 2 hrs after food. 120 capsule 10   nitroGLYCERIN (NITROSTAT) 0.4 MG SL tablet DISSOLVE 1 TABLET UNDER THE TONGUE EVERY 5 MINUTES AS NEEDED FOR CHEST PAIN 25 tablet 4   sildenafil (VIAGRA) 100 MG tablet Take 0.5-1 tablets (50-100 mg total) by mouth daily as needed for erectile dysfunction. 5 tablet 11   valsartan (DIOVAN) 160 MG tablet TAKE 1 TABLET BY MOUTH ONCE DAILY *DISCONTINUE  ENTRESTO* 90 tablet 0   No current facility-administered medications for this visit.     Allergies:   Norvasc [amlodipine besylate] and Sulfa antibiotics   Social History: Social History   Socioeconomic History   Marital status: Married    Spouse name: Not on file   Number of children: 2   Years of education: Not on file   Highest education level: Not on file    Occupational History    Comment: retired Production designer, theatre/television/film strain: Not on file   Food insecurity    Worry: Not on file    Inability: Not on file   Transportation needs    Medical: Not on file    Non-medical: Not on file  Tobacco Use   Smoking status: Former Smoker    Packs/day: 3.00    Years: 45.00    Pack years: 135.00    Types: Cigarettes    Quit date: 08/08/1997    Years since quitting: 21.6   Smokeless tobacco: Former Network engineer and Sexual Activity   Alcohol use: No    Alcohol/week: 0.0 standard drinks   Drug use: No   Sexual activity: Yes  Lifestyle   Physical activity    Days per week: Not on  file    Minutes per session: Not on file   Stress: Not on file  Relationships   Social connections    Talks on phone: Not on file    Gets together: Not on file    Attends religious service: Not on file    Active member of club or organization: Not on file    Attends meetings of clubs or organizations: Not on file    Relationship status: Not on file   Intimate partner violence    Fear of current or ex partner: Not on file    Emotionally abused: Not on file    Physically abused: Not on file    Forced sexual activity: Not on file  Other Topics Concern   Not on file  Social History Narrative   Not on file    Family History: Family History  Problem Relation Age of Onset   Heart disease Mother    Alcohol abuse Father     Review of Systems: All other systems reviewed and are otherwise negative except as noted above.   Physical Exam: VS:  BP 106/60    Pulse 74    Ht 5\' 9"  (1.753 m)    Wt 170 lb 9.6 oz (77.4 kg)    SpO2 98%    BMI 25.19 kg/m  , BMI Body mass index is 25.19 kg/m.  GEN- The patient is well appearing, alert and oriented x 3 today.   HEENT: normocephalic, atraumatic; sclera clear, conjunctiva pink; hearing intact; oropharynx clear; neck supple  Lungs- Clear to ausculation bilaterally, normal work of  breathing.  No wheezes, rales, rhonchi Heart- Regular rate and rhythm  GI- soft, non-tender, non-distended, bowel sounds present, no hepatosplenomegaly Extremities- no clubbing, cyanosis, or edema  MS- no significant deformity or atrophy Skin- warm and dry, no rash or lesion; ICD pocket well healed Psych- euthymic mood, full affect Neuro- strength and sensation are intact  ICD interrogation- reviewed in detail today,  See PACEART report  EKG:  EKG is not ordered today.  Recent Labs: 03/07/2019: ALT 10; BUN 9; Creatinine, Ser 0.83; Hemoglobin 10.8; Magnesium 1.9; Platelets 179; Potassium 3.8; Sodium 143   Wt Readings from Last 3 Encounters:  03/28/19 170 lb 9.6 oz (77.4 kg)  03/25/19 169 lb (76.7 kg)  03/18/19 167 lb 3.2 oz (75.8 kg)     Other studies Reviewed: Additional studies/ records that were reviewed today include: Dr Olin Pia office notes   Assessment and Plan:  1.  Chronic systolic dysfunction euvolemic today Stable on an appropriate medical regimen Normal ICD function See Pace Art report No changes today  2.  Ventricular tachycardia No recent recurrence He did not tolerate amiodarone in the past He is currently on Mexiletine  3.  CAD s/p CABG No recent ischemic symptoms Continue current therapy    Current medicines are reviewed at length with the patient today.   The patient does not have concerns regarding his medicines.  The following changes were made today:  none  Labs/ tests ordered today include: none No orders of the defined types were placed in this encounter.    Disposition:   Follow up with [facility], Dr Caryl Comes 6 months     Signed, Chanetta Marshall, NP 03/28/2019 9:59 AM  [facility] [address] [place] [address] [phone] (office) [phone] (fax)

## 2019-03-28 ENCOUNTER — Ambulatory Visit (INDEPENDENT_AMBULATORY_CARE_PROVIDER_SITE_OTHER): Payer: Medicare HMO | Admitting: Nurse Practitioner

## 2019-03-28 ENCOUNTER — Other Ambulatory Visit: Payer: Self-pay

## 2019-03-28 ENCOUNTER — Encounter: Payer: Self-pay | Admitting: Nurse Practitioner

## 2019-03-28 VITALS — BP 106/60 | HR 74 | Ht 69.0 in | Wt 170.6 lb

## 2019-03-28 DIAGNOSIS — I5022 Chronic systolic (congestive) heart failure: Secondary | ICD-10-CM

## 2019-03-28 DIAGNOSIS — I2510 Atherosclerotic heart disease of native coronary artery without angina pectoris: Secondary | ICD-10-CM

## 2019-03-28 DIAGNOSIS — I472 Ventricular tachycardia, unspecified: Secondary | ICD-10-CM

## 2019-03-28 NOTE — Patient Instructions (Addendum)
Medication Instructions:  none If you need a refill on your cardiac medications before your next appointment, please call your pharmacy.   Lab work: none If you have labs (blood work) drawn today and your tests are completely normal, you will receive your results only by: Marland Kitchen MyChart Message (if you have MyChart) OR . A paper copy in the mail If you have any lab test that is abnormal or we need to change your treatment, we will call you to review the results.  Testing/Procedures: none  Follow-Up: 6 months with Dr Caryl Comes At [facility] you and your health needs are our priority.  As part of our continuing mission to provide you with exceptional heart care, we have created designated Provider Care Teams.  These Care Teams include your primary Cardiologist (physician) and Advanced Practice Providers (APPs -  Physician Assistants and Nurse Practitioners) who all work together to provide you with the care you need, when you need it. .   Any Other Special Instructions Will Be Listed Below (If Applicable). Remote monitoring is used to monitor your ICD from home. This monitoring reduces the number of office visits required to check your device to one time per year. It allows [place] to keep an eye on the functioning of your device to ensure it is working properly. You are scheduled for a device check from home on 06/17/19. You may send your transmission at any time that day. If you have a wireless device, the transmission will be sent automatically. After your physician reviews your transmission, you will receive a postcard with your next transmission date.

## 2019-03-29 LAB — CUP PACEART INCLINIC DEVICE CHECK
Battery Remaining Longevity: 85 mo
Battery Voltage: 2.99 V
Brady Statistic RV Percent Paced: 0.15 %
Date Time Interrogation Session: 20200820140738
HighPow Impedance: 55 Ohm
Implantable Lead Implant Date: 20151014
Implantable Lead Location: 753860
Implantable Pulse Generator Implant Date: 20151014
Lead Channel Impedance Value: 342 Ohm
Lead Channel Impedance Value: 418 Ohm
Lead Channel Pacing Threshold Amplitude: 0.75 V
Lead Channel Pacing Threshold Pulse Width: 0.4 ms
Lead Channel Sensing Intrinsic Amplitude: 7.625 mV
Lead Channel Sensing Intrinsic Amplitude: 9.875 mV
Lead Channel Setting Pacing Amplitude: 2 V
Lead Channel Setting Pacing Pulse Width: 0.4 ms
Lead Channel Setting Sensing Sensitivity: 0.3 mV

## 2019-04-03 ENCOUNTER — Telehealth: Payer: Self-pay | Admitting: Hematology and Oncology

## 2019-04-03 NOTE — Telephone Encounter (Signed)
I talk with patient regarding schedule  

## 2019-04-08 MED FILL — TASIGNA 150 MG CAPSULE: 150 | 28 days supply | Qty: 112 | Fill #7

## 2019-05-06 MED FILL — TASIGNA 150 MG CAPSULE: 150 | 28 days supply | Qty: 112 | Fill #8

## 2019-05-07 ENCOUNTER — Other Ambulatory Visit: Payer: Self-pay

## 2019-05-07 ENCOUNTER — Ambulatory Visit: Payer: Medicare HMO

## 2019-05-14 ENCOUNTER — Other Ambulatory Visit: Payer: Self-pay

## 2019-05-14 ENCOUNTER — Ambulatory Visit (INDEPENDENT_AMBULATORY_CARE_PROVIDER_SITE_OTHER): Payer: Medicare HMO

## 2019-05-14 DIAGNOSIS — Z23 Encounter for immunization: Secondary | ICD-10-CM

## 2019-05-20 ENCOUNTER — Other Ambulatory Visit: Payer: Self-pay | Admitting: Family Medicine

## 2019-05-31 ENCOUNTER — Ambulatory Visit (INDEPENDENT_AMBULATORY_CARE_PROVIDER_SITE_OTHER): Payer: Medicare HMO | Admitting: Family Medicine

## 2019-05-31 ENCOUNTER — Other Ambulatory Visit: Payer: Self-pay

## 2019-05-31 VITALS — BP 144/70 | HR 62 | Temp 98.4°F | Resp 18 | Ht 69.0 in | Wt 155.0 lb

## 2019-05-31 DIAGNOSIS — Z8679 Personal history of other diseases of the circulatory system: Secondary | ICD-10-CM | POA: Diagnosis not present

## 2019-05-31 DIAGNOSIS — I509 Heart failure, unspecified: Secondary | ICD-10-CM

## 2019-05-31 DIAGNOSIS — R7309 Other abnormal glucose: Secondary | ICD-10-CM | POA: Diagnosis not present

## 2019-05-31 DIAGNOSIS — I429 Cardiomyopathy, unspecified: Secondary | ICD-10-CM | POA: Diagnosis not present

## 2019-05-31 DIAGNOSIS — J438 Other emphysema: Secondary | ICD-10-CM

## 2019-05-31 DIAGNOSIS — E7800 Pure hypercholesterolemia, unspecified: Secondary | ICD-10-CM | POA: Diagnosis not present

## 2019-05-31 MED ORDER — ZOLPIDEM TARTRATE 10 MG PO TABS: 10.0000 mg | ORAL_TABLET | Freq: Every evening | ORAL | 1 refills | Status: DC | PRN

## 2019-05-31 MED ORDER — ALBUTEROL SULFATE HFA 108 (90 BASE) MCG/ACT IN AERS: 2.0000 | INHALATION_SPRAY | Freq: Four times a day (QID) | RESPIRATORY_TRACT | 2 refills | Status: DC | PRN

## 2019-05-31 NOTE — Progress Notes (Signed)
Subjective:    Patient ID: Anthony Wolfe, male    DOB: 20-Jul-1946, 73 y.o.   MRN: [id number]  Diarrhea     75/2019 Up until last week, the patient was doing well.  Starting last week he developed a queasy feeling in his stomach.  He reported early satiety, no appetite, nausea, he has chronic diarrhea.  He denies fever.  He denies melena.  He denies hematochezia.  He denies any vomiting.  Symptoms lasted 3 or 4 days.  2 days ago, he took Pepto-Bismol.  After that, his symptoms subsided.  He continues to have chronic diarrhea likely related to his cancer medication but otherwise the GI symptoms have completely gone away.  At that time, my plan was: Exam is reassuring.  Symptoms have completely subsided.  Patient was concerned the medication valsartan was causing his symptoms.  He saw an ad on television stating that this medication can cause cancer and he was worried that he may have stomach cancer.  I reassured the patient that I am certain that the valsartan was not causing any of his symptoms.  I also tried to assuage his fears regarding the linkage between valsartan, the contaminant [employer], and cancer.  His manufacturer has not been recalled.  I recommended that he remain on the medication given his underlying congestive heart failure especially since he cannot afford entresto.  For the time being and we will clinically monitor the patient.  If symptoms return, we will institute a more thorough GI workup.  At the present time I suspect that this may have just been a mild stomach virus or possibly related to his bosulif.   01/05/18 Patient is here today for a general checkup.  He states that his breathing is much improved since he resumed Entresto 24/26 p.o. twice daily.  He denies any lightheadedness.  He denies any presyncope.  He is due to recheck renal function and potassium on the medication.  He continues to have some mild dyspnea on exertion however this is much improved on the [place] compared  to without.  He is willing to pay the cost of the medication due to the benefit that he receives from it.  He denies any orthopnea.  He denies any paroxysmal nocturnal dyspnea.  He denies any arrhythmias or palpitations.  He denies any syncope.  He does have underlying COPD but he denies any cough, wheezing, or hemoptysis.  He too is due for a screening colonoscopy.  He is requesting that I schedule this for him.  At that time, my plan was: I would increase Entresto to 49/51 1 p.o. twice daily to optimize treatment for his congestive heart failure/ischemic cardiomyopathy.  Otherwise he is on appropriate medication for his history of coronary artery disease.  He is on an antiplatelet agent, Plavix.  He is on a beta-blocker carvedilol that is not exacerbating his COPD.  He is on Entresto as dictated above.  He is on high-dose statin, 80 mg of Lipitor.  I will check a fasting lipid panel.  Goal LDL cholesterol is less than 70.  His COPD is stable.  This is the best the patient has felt in quite some time.  Therefore I will go ahead and schedule GI consultation to arrange colonoscopy to update his general health preventative screening  01/16/18 Patient presents today complaining of feeling weak and lightheaded.  He feels extremely tired and dizzy.  Recently he has been working in his garden and whenever he he stands up after  bending over, he will feel lightheaded like he may pass out.  His blood pressure is extremely low at 80/42 today.  He is only noticed the symptoms over the last 3 to 4 days.  During that time, he is not eating as well.  Furthermore he has had increased diarrhea recently.  He has chronic diarrhea which has been attributed to his cancer medication in the past.  However the patient has independently discontinued the cancer medication in April and the diarrhea has not improved.  His wife and the patient are both concerned that he may have a recurrent bout of C. difficile colitis as he is experienced  this in the past.  He denies any chest pain shortness of breath or dyspnea on exertion.  At that time, my plan was: I believe the patient is experiencing severe orthostatic hypotension due to multiple factors including dehydration possibly from diarrhea, overmedication due to the recent increased dose of Entresto, and decreased oral intake over the last 3 to 4 days.  Therefore I will give the patient IV fluids slowly given his congestive heart failure.  He will be given 500 cc of normal saline slowly today in clinic.  I will check a CBC to rule out anemia.  I will check a CMP to rule out kidney failure or severe dehydration.  I will check a GI pathogen panel stool test to rule out infectious causes of diarrhea.  I will increase the patient's dose of Lomotil to 2 tablets 4 times a day to try to stop the diarrhea to calm the dehydration and I will temporarily discontinue his Delene Loll and then see the patient back in clinic on Thursday to see if he is feeling better.  If symptoms worsen he will need to go to the hospital  01/18/18 Patient feels much better.  His blood pressure is back to normal range today.  He denies any orthostatic dizziness.  He denies any near syncope.  His diarrhea has also improved as his wife is liberalized her use of Lomotil.  Overall he feels much better and back to normal.  He is not taking his Entresto.  He is complaining of pain in his left heel that is been there for approximately 2 months.  Pain is located at the insertion of the plantar fascia on the left calcaneus.  There is a palpable bone spur in that area as well.  It hurts with ambulation in the morning.  Any pressure on his heel causes pain.  He denies any falls or injury.  05/31/19 Patient is a very pleasant 73 year old Caucasian male here today for regular checkup.  He has a history of coronary artery disease status post three-vessel CABG in 2010.  He also had a non-ST elevation myocardial infarction in 2015.  He also has  ischemic cardiomyopathy with an ejection fraction of 40%.  He was unable to tolerate Entresto due to hypotension.  However he is on carvedilol as well as valsartan.  His blood pressure is reasonably well controlled at 144/70.  He denies any chest pain shortness of breath or dyspnea on exertion.  In fact this week both he and his wife were splitting firewood and doing vigorous physical activity with no deleterious effects.  He also has a history of a right carotid endarterectomy.  On exam today he does have a known bruit however he had a vascular ultrasound in March that showed no significant carotid artery stenosis.  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  He denies any stroke or TIA symptoms.  He is still taking his Plavix for secondary prevention of stroke as well as heart disease.  He is overdue for colonoscopy but he refuses this.  His pneumonia vaccines and flu shot are up-to-date.  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  I treated this with liquid nitrogen cryotherapy.  The lesion is approximately 5 mm in diameter.  It is a scaly hard papule.  If this does not improve, I would recommend seeing dermatology for excision. Past Medical History:  Diagnosis Date  . AICD (automatic cardioverter/defibrillator) present   . Allergic rhinitis   . 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 Anxiety   . Blood dyscrasia    cmleukemia  . C. difficile colitis 06/16/2014  . CAD (coronary artery disease)    a. s/p 3v CABG 2010. b. NSTEMI 05/2014 in setting of VT. Cath impressions: "Recent IMI with occluded SVG to RCA. Has Right to Right collaterals and collaterals from septal and OM. EF 40%."  . Carotid disease, bilateral ([place])    a. Right CEA 2002; known occluded left carotid. b. Duplex 06/2014: known occluded LICA, stable [id number] RICA s/p CEA.  . Chronic systolic CHF (congestive heart failure) ([place])   . 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  Depression   . Emphysema of lung ([place])   . HTN (hypertension)   . Hyperlipidemia   . Ischemic cardiomyopathy    a. Prior EF 36%. b. 2014: 50%. c. 05/2014: EF 35-40% by echo, 40% with inf HK by cath.  . NSTEMI (non-ST elevation myocardial infarction) ([place]) 05/2014  . Pleural effusion 05/2014  . Pneumonia 05/23/2014  . Presence of permanent cardiac pacemaker   . Prostate hypertrophy   . PVC's (premature ventricular contractions)    a. PVC's with prior Holter showing PVC load of 22%.  . Shortness of breath dyspnea   . Stroke ([place]) 01  . TIA (transient ischemic attack)    ASPVD, S./P. right CEA, 2002, and known occluded left carotid  . Ventricular tachycardia ([place])    a. Admitted with VT 05/2013 - EPS with inducible sustained monomorphic VT; s/p Med[employer] ICD 05/21/14.   Past Surgical History:  Procedure Laterality Date  . BACK SURGERY    . CAROTID ENDARTERECTOMY Right 01  . carotidectomy  2003   right side  . COLONOSCOPY  07/2010  . CORONARY ARTERY BYPASS GRAFT  02/2009   3 vessel  . ELECTROPHYSIOLOGIC STUDY  05/21/14  . ELECTROPHYSIOLOGY STUDY N/A 05/21/2014   Procedure: ELECTROPHYSIOLOGY STUDY;  Surgeon: Deboraha Sprang, MD;  Location: [facility] CATH LAB;  Service: Cardiovascular;  Laterality: N/A;  . EP IMPLANTABLE DEVICE  05/21/14   single chamber Metronic ICD  . LEFT HEART CATHETERIZATION WITH CORONARY/GRAFT ANGIOGRAM N/A 05/19/2014   Procedure: LEFT HEART CATHETERIZATION WITH Beatrix Fetters;  Surgeon: Josue Hector, MD;  Location[facility] CATH LAB;  Service: Cardiovascular;  Laterality: N/A;  . PLEURAL BIOPSY N/A 11/27/2014   Procedure: PLEURAL BIOPSY;  Surgeon: [name], MD;  Location: [place];  Service: Thoracic;  Laterality: N/A;  . PLEURAL EFFUSION DRAINAGE Right 11/27/2014   Procedure: DRAINAGE OF PLEURAL EFFUSION;  Surgeon: [name], MD;  Location: [place];  Service: Thoracic;  Laterality: Right;  . TALC PLEURODESIS Right 11/27/2014   Procedure: Pietro Cassis;  Surgeon: [name], MD;  Location: [place];  Service: Thoracic;  Laterality: Right;  Marland Kitchen VIDEO ASSISTED THORACOSCOPY  Right 11/27/2014   Procedure: RIGHT VIDEO ASSISTED THORACOSCOPY;  Surgeon: [name], MD;  Location: [place];  Service: Thoracic;  Laterality: Right;   Current Outpatient Medications on File Prior to Visit  Medication Sig Dispense Refill  . albuterol (PROVENTIL HFA;VENTOLIN HFA) 108 (90 Base) MCG/ACT inhaler Inhale 2 puffs into the lungs every 6 (six) hours as needed for wheezing or shortness of breath. 1 Inhaler 0  . atorvastatin (LIPITOR) 80 MG tablet TAKE 1 TABLET BY MOUTH ONCE DAILY AT  6 PM. 90 tablet 1  . carvedilol (COREG) 12.5 MG tablet Take 1 tablet (12.5 mg total) by mouth 2 (two) times daily. 180 tablet 3  . cetirizine (ZYRTEC) 10 MG tablet Take 1 tablet (10 mg total) by mouth daily. 30 tablet 0  . clopidogrel (PLAVIX) 75 MG tablet TAKE 1 TABLET BY MOUTH DAILY 90 tablet 3  . diazepam (VALIUM) 10 MG tablet TAKE 1/2 TO 1 (ONE-HALF TO ONE) TABLET BY MOUTH EVERY 8 HOURS AS NEEDED FOR MUSCLE SPASM OR  ANXIETY 30 tablet 0  . fluticasone (FLONASE) 50 MCG/ACT nasal spray Place 2 sprays into both nostrils daily. 16 g 0  . mexiletine (MEXITIL) 200 MG capsule Take 1 capsule (200 mg total) by mouth 2 (two) times daily. 180 capsule 3  . nilotinib (TASIGNA) 150 MG capsule Take 2 capsules (300 mg total) by mouth every 12 (twelve) hours. Take on an empty stomach, 1 hr before or 2 hrs after food. 120 capsule 10  . nitroGLYCERIN (NITROSTAT) 0.4 MG SL tablet DISSOLVE 1 TABLET UNDER THE TONGUE EVERY 5 MINUTES AS NEEDED FOR CHEST PAIN 25 tablet 4  . sildenafil (VIAGRA) 100 MG tablet Take 0.5-1 tablets (50-100 mg total) by mouth daily as needed for erectile dysfunction. 5 tablet 11  . valsartan (DIOVAN) 160 MG tablet TAKE 1 TABLET BY MOUTH ONCE DAILY ( DISCONTINUE ENTRESTO ) 90 tablet 0   No current facility-administered medications on file prior to visit.     Allergies  Allergen Reactions  . Norvasc [Amlodipine Besylate] Rash    rash  . Sulfa Antibiotics Hives    Hives    Social History   Socioeconomic History  . Marital status: Married    Spouse name: Not on file  . Number of children: 2  . Years of education: Not on file  . Highest education level: Not on file  Occupational History    Comment: retired Development worker, [employer]  Social Needs  . Financial resource strain: Not on file  . Food insecurity    Worry: Not on file    Inability: Not on file  . Transportation needs    Medical: Not on file    Non-medical: Not on file  Tobacco Use  . Smoking status: Former Smoker    Packs/day: 3.00    Years: 45.00    Pack years: 135.00    Types: Cigarettes    Quit date: 08/08/1997    Years since quitting: 21.8  . Smokeless tobacco: Former Network engineer and Sexual Activity  . Alcohol use: No    Alcohol/week: 0.0 standard drinks  . Drug use: No  . Sexual activity: Yes  Lifestyle  . Physical activity    Days per week: Not on file    Minutes per session: Not on file  . Stress: Not on file  Relationships  . Social Herbalist on phone: Not on file    Gets together: Not on file    Attends religious service: Not  on file    Active member of club or organization: Not on file    Attends meetings of clubs or organizations: Not on file    Relationship status: Not on file  . Intimate partner violence    Fear of current or ex partner: Not on file    Emotionally abused: Not on file    Physically abused: Not on file    Forced sexual activity: Not on file  Other Topics Concern  . Not on file  Social History Narrative  . Not on file      Review of Systems  Gastrointestinal: Positive for diarrhea.  All other systems reviewed and are negative.      Objective:   Physical Exam  Constitutional: He appears well-developed and well-nourished.  Cardiovascular: Normal rate, regular rhythm and normal heart sounds.  No murmur heard.  Pulmonary/Chest: Effort normal and breath sounds normal. No respiratory distress. He has no wheezes. He has no rales.  Abdominal: Soft. Bowel sounds are normal. He exhibits no distension and no mass. There is no abdominal tenderness. There is no rebound and no guarding.  Vitals reviewed.         Assessment & Plan:  Congestive heart failure with cardiomyopathy ([place]) - Plan: CBC with Differential/Platelet, COMPLETE METABOLIC PANEL WITH GFR, Lipid panel  Other emphysema (HCC)  Pure hypercholesterolemia - Plan: CBC with Differential/Platelet, COMPLETE METABOLIC PANEL WITH GFR, Lipid panel  History of ASCVD - Plan: CBC with Differential/Platelet, COMPLETE METABOLIC PANEL WITH GFR, Lipid panel  Physical exam today is normal aside from his right carotid bruit.  He is completely asymptomatic.  He is on both a beta-blocker as well as an angiotensin receptor blocker.  He could not tolerate Entresto.  I will check a CMP to monitor his electrolytes as well as a fasting lipid panel.  Given his history of cardiovascular disease including myocardial infarction and TIA, I would recommend an LDL cholesterol less than 70.  He is on antiplatelet therapy with Plavix and is compliant with this.  He also has emphysema however he is only using albuterol as needed.  He does not benefit from triple therapy as we have tried this in the past.  His immunizations are up-to-date.  He did request a refill on his albuterol that he uses as needed for wheezing.  He also requested a prescription for something to help him sleep that he can take on occasion.  Therefore I gave him Ambien 10 mg.  He can take 1/2 to 1 tablet as needed for insomnia.  Again I treated the lesion on the superior aspect of his left equal helix with liquid nitrogen cryotherapy for 30 seconds.  If persistent I would recommend a dermatology consultation.  He refuses a colonoscopy

## 2019-06-03 MED FILL — TASIGNA 150 MG CAPSULE: 150 | 28 days supply | Qty: 112 | Fill #9

## 2019-06-06 LAB — CBC WITH DIFFERENTIAL/PLATELET
Absolute Monocytes: 612 cells/uL (ref 200–950)
Basophils Absolute: 68 cells/uL (ref 0–200)
Basophils Relative: 1 %
Eosinophils Absolute: 129 cells/uL (ref 15–500)
Eosinophils Relative: 1.9 %
HCT: 36.1 % — ABNORMAL LOW (ref 38.5–50.0)
Hemoglobin: 11.7 g/dL — ABNORMAL LOW (ref 13.2–17.1)
Lymphs Abs: 1040 cells/uL (ref 850–3900)
MCH: 31 pg (ref 27.0–33.0)
MCHC: 32.4 g/dL (ref 32.0–36.0)
MCV: 95.8 fL (ref 80.0–100.0)
MPV: 10.9 fL (ref 7.5–12.5)
Monocytes Relative: 9 %
Neutro Abs: 4950 cells/uL (ref 1500–7800)
Neutrophils Relative %: 72.8 %
Platelets: 222 10*3/uL (ref 140–400)
RBC: 3.77 10*6/uL — ABNORMAL LOW (ref 4.20–5.80)
RDW: 13.4 % (ref 11.0–15.0)
Total Lymphocyte: 15.3 %
WBC: 6.8 10*3/uL (ref 3.8–10.8)

## 2019-06-06 LAB — COMPLETE METABOLIC PANEL WITH GFR
AG Ratio: 1.4 (calc) (ref 1.0–2.5)
ALT: 8 U/L — ABNORMAL LOW (ref 9–46)
AST: 11 U/L (ref 10–35)
Albumin: 3.7 g/dL (ref 3.6–5.1)
Alkaline phosphatase (APISO): 88 U/L (ref 35–144)
BUN: 13 mg/dL (ref 7–25)
CO2: 24 mmol/L (ref 20–32)
Calcium: 8.7 mg/dL (ref 8.6–10.3)
Chloride: 110 mmol/L (ref 98–110)
Creat: 0.9 mg/dL (ref 0.70–1.18)
GFR, Est African American: 96 mL/min/{1.73_m2} (ref >=60)
GFR, Est Non African American: 83 mL/min/{1.73_m2} (ref >=60)
Globulin: 2.6 g/dL (calc) (ref 1.9–3.7)
Glucose, Bld: 135 mg/dL — ABNORMAL HIGH (ref 65–99)
Potassium: 4.4 mmol/L (ref 3.5–5.3)
Sodium: 143 mmol/L (ref 135–146)
Total Bilirubin: 0.6 mg/dL (ref 0.2–1.2)
Total Protein: 6.3 g/dL (ref 6.1–8.1)

## 2019-06-06 LAB — LIPID PANEL
Cholesterol: 112 mg/dL (ref <200)
HDL: 44 mg/dL (ref >=40)
LDL Cholesterol (Calc): 54 mg/dL (calc)
Non-HDL Cholesterol (Calc): 68 mg/dL (calc) (ref <130)
Total CHOL/HDL Ratio: 2.5 (calc) (ref <5.0)
Triglycerides: 67 mg/dL (ref <150)

## 2019-06-06 LAB — TEST AUTHORIZATION 2

## 2019-06-06 LAB — HEMOGLOBIN A1C W/OUT EAG: Hgb A1c MFr Bld: 5.1 % of total Hgb (ref <5.7)

## 2019-06-17 ENCOUNTER — Other Ambulatory Visit: Payer: Self-pay

## 2019-06-17 ENCOUNTER — Encounter: Payer: Medicare HMO | Admitting: *Deleted

## 2019-06-17 ENCOUNTER — Inpatient Hospital Stay: Payer: Medicare HMO | Attending: Hematology and Oncology

## 2019-06-17 DIAGNOSIS — D6481 Anemia due to antineoplastic chemotherapy: Secondary | ICD-10-CM | POA: Insufficient documentation

## 2019-06-17 DIAGNOSIS — C9210 Chronic myeloid leukemia, BCR/ABL-positive, not having achieved remission: Secondary | ICD-10-CM | POA: Diagnosis not present

## 2019-06-17 DIAGNOSIS — I255 Ischemic cardiomyopathy: Secondary | ICD-10-CM | POA: Diagnosis not present

## 2019-06-17 DIAGNOSIS — Z79899 Other long term (current) drug therapy: Secondary | ICD-10-CM | POA: Insufficient documentation

## 2019-06-17 LAB — COMPREHENSIVE METABOLIC PANEL
ALT: 7 U/L (ref 0–44)
AST: 8 U/L — ABNORMAL LOW (ref 15–41)
Albumin: 3.3 g/dL — ABNORMAL LOW (ref 3.5–5.0)
Alkaline Phosphatase: 89 U/L (ref 38–126)
Anion gap: 7 (ref 5–15)
BUN: 12 mg/dL (ref 8–23)
CO2: 26 mmol/L (ref 22–32)
Calcium: 8.4 mg/dL — ABNORMAL LOW (ref 8.9–10.3)
Chloride: 109 mmol/L (ref 98–111)
Creatinine, Ser: 0.91 mg/dL (ref 0.61–1.24)
GFR calc Af Amer: >60 mL/min (ref >60)
GFR calc non Af Amer: >60 mL/min (ref >60)
Glucose, Bld: 122 mg/dL — ABNORMAL HIGH (ref 70–99)
Potassium: 4.2 mmol/L (ref 3.5–5.1)
Sodium: 142 mmol/L (ref 135–145)
Total Bilirubin: 0.6 mg/dL (ref 0.3–1.2)
Total Protein: 6.3 g/dL — ABNORMAL LOW (ref 6.5–8.1)

## 2019-06-17 LAB — CBC WITH DIFFERENTIAL/PLATELET
Abs Immature Granulocytes: 0.02 10*3/uL (ref 0.00–0.07)
Basophils Absolute: 0.1 10*3/uL (ref 0.0–0.1)
Basophils Relative: 1 %
Eosinophils Absolute: 0.1 10*3/uL (ref 0.0–0.5)
Eosinophils Relative: 2 %
HCT: 35.3 % — ABNORMAL LOW (ref 39.0–52.0)
Hemoglobin: 11.5 g/dL — ABNORMAL LOW (ref 13.0–17.0)
Immature Granulocytes: 0 %
Lymphocytes Relative: 15 %
Lymphs Abs: 1 10*3/uL (ref 0.7–4.0)
MCH: 31.3 pg (ref 26.0–34.0)
MCHC: 32.6 g/dL (ref 30.0–36.0)
MCV: 96.2 fL (ref 80.0–100.0)
Monocytes Absolute: 0.5 10*3/uL (ref 0.1–1.0)
Monocytes Relative: 8 %
Neutro Abs: 5.1 10*3/uL (ref 1.7–7.7)
Neutrophils Relative %: 74 %
Platelets: 200 10*3/uL (ref 150–400)
RBC: 3.67 MIL/uL — ABNORMAL LOW (ref 4.22–5.81)
RDW: 14 % (ref 11.5–15.5)
WBC: 6.9 10*3/uL (ref 4.0–10.5)
nRBC: 0 % (ref 0.0–0.2)

## 2019-06-17 LAB — MAGNESIUM: Magnesium: 1.8 mg/dL (ref 1.7–2.4)

## 2019-06-19 ENCOUNTER — Other Ambulatory Visit: Payer: Self-pay | Admitting: Family Medicine

## 2019-06-19 ENCOUNTER — Telehealth: Payer: Self-pay

## 2019-06-19 NOTE — Telephone Encounter (Signed)
Left message for patient to remind of missed remote transmission. LL 

## 2019-06-25 LAB — BCR/ABL

## 2019-06-26 MED FILL — TASIGNA 150 MG CAPSULE: 150 | 28 days supply | Qty: 112 | Fill #10

## 2019-06-27 ENCOUNTER — Other Ambulatory Visit: Payer: Self-pay

## 2019-06-27 ENCOUNTER — Inpatient Hospital Stay: Payer: Medicare HMO | Admitting: Hematology and Oncology

## 2019-06-27 ENCOUNTER — Encounter: Payer: Self-pay | Admitting: Hematology and Oncology

## 2019-06-27 DIAGNOSIS — D6481 Anemia due to antineoplastic chemotherapy: Secondary | ICD-10-CM | POA: Diagnosis not present

## 2019-06-27 DIAGNOSIS — I255 Ischemic cardiomyopathy: Secondary | ICD-10-CM | POA: Diagnosis not present

## 2019-06-27 DIAGNOSIS — C9210 Chronic myeloid leukemia, BCR/ABL-positive, not having achieved remission: Secondary | ICD-10-CM

## 2019-06-27 DIAGNOSIS — T451X5A Adverse effect of antineoplastic and immunosuppressive drugs, initial encounter: Secondary | ICD-10-CM

## 2019-06-27 DIAGNOSIS — Z79899 Other long term (current) drug therapy: Secondary | ICD-10-CM | POA: Diagnosis not present

## 2019-06-27 NOTE — Assessment & Plan Note (Signed)
He continues on aggressive medical management He has no recent exacerbation of congestive heart failure Continue medical management 

## 2019-06-27 NOTE — Assessment & Plan Note (Signed)
This is likely anemia of chronic disease. The patient denies recent history of bleeding such as epistaxis, hematuria or hematochezia. He is asymptomatic from the anemia. We will observe for now.  

## 2019-06-27 NOTE — Assessment & Plan Note (Signed)
So far, he tolerated chemotherapy very well without major side effects I reviewed recent blood work and gave him a copy of the test results The patient has achieved complete response on chemotherapy The goal of treatment would be palliative and indefinite treatment withTasigna due to relapse while on active surveillance He will continue treatment indefinitely I will see him back in 4 months for further follow-up

## 2019-06-27 NOTE — Progress Notes (Signed)
[place] OFFICE PROGRESS NOTE  Patient Care Team: [name], MD as PCP - General (Family Medicine) [name]., MD as Attending Physician (Cardiology) [name], MD (Family Medicine) [name], MD (Cardiothoracic Surgery) Early, Arvilla Meres, MD as Attending Physician (Vascular Surgery) [name], MD as Consulting Physician (Hematology and Oncology) Deboraha Sprang, MD as Consulting Physician (Cardiology)  ASSESSMENT & PLAN:  Chronic myelogenous leukemia So far, he tolerated chemotherapy very well without major side effects I reviewed recent blood work and gave him a copy of the test results The patient has achieved complete response on chemotherapy The goal of treatment would be palliative and indefinite treatment withTasigna due to relapse while on active surveillance He will continue treatment indefinitely I will see him back in 4 months for further follow-up  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 The patient denies recent history of bleeding such as epistaxis, hematuria or hematochezia. He is asymptomatic from the anemia. We will observe for now.  Cardiomyopathy, ischemic He continues on aggressive medical management He has no recent exacerbation of congestive heart failure Continue medical management   No orders of the defined types were placed in this encounter.   INTERVAL HISTORY: Please see below for problem oriented charting. He returns for further follow-up for CML He is doing well No recent infection, fever or chills No recent side effects from chemotherapy so far He is compliant taking all his medications as directed No recent exacerbation of congestive heart failure  SUMMARY OF ONCOLOGIC HISTORY: Oncology History  Chronic myelogenous leukemia ([place])  05/04/2012 Bone Marrow Biopsy   BM confirmed diagnosis of CML in Chronic phase. BCR/ABL by PCR detected abnormalitis with  b2a2 & b3a2 subtypes   05/09/2012 - 03/16/2015 Chemotherapy   He was started on treatment with Dasatinib 100 mg daily   06/06/2012 Adverse Reaction   Dose of medication was reduced to 50 mg daily due to pancytopenia   01/24/2013 Progression   Patient was noted to have elevated blood count which and was subsequently found to be noncompliant to treatment. The patient has not been on treatment for several months because his prescription ran out. He was restarted back on treatment   01/16/2014 Progression   Bcr/ABL by PCR is worse. Dose of Dasatinib was increased to 100 mg.   02/26/2014 Tumor Marker   Blood work for ABL kinase mutation was negative.   10/29/2014 Tumor Marker   BCR/ABL b2a2 & b3a2 0.29%, IS 0.1624%, not in MMR yet but improving   11/05/2014 Adverse Reaction   He had thoracentesis due to pleural effusion   11/27/2014 Surgery   He underwent right video-assisted thoracoscopy, Drainage of pleural effusion, Pleural biopsy, Diaphragm biopsy, Lung biopsy & [place] pleurodesis   01/30/2015 Tumor Marker   BCR/ABL b2a2 & b3a2 0.78%, IS 0.4368%, not in MMR    03/11/2015 Pathology Results   BCR/ABL b2a2 0.19%, IS 0.1064%, not in MMR    03/16/2015 Adverse Reaction   Dasatinib was stopped due to recurrent pleural effusion   03/28/2015 - 11/06/2017 Chemotherapy   He started on Bosutinib   03/30/2015 Procedure   He has therapeutic thoracentesis of the right lung with 1 liter of fluid removed   03/30/2015 Adverse Reaction   Bosutinib is placed on hold, to be restart on 8/25 at 250 mg due to severe diarrhea   04/28/2015 Tumor Marker   BCR/ABL b2a2 0.06%, IS 0.0336%, in MMR    06/10/2015  Tumor Marker   BCR/ABL b2a2 0.14%, IS 0.1162%, not in MMR    08/14/2015 Pathology Results   BCR/ABL b2a2 0.02%, IS 0.0166%, In MMR    11/09/2015 Tumor Marker   BCR/ABL b2a2 0.005%, IS 0.0042%, In MMR    02/08/2016 Tumor Marker   BCR/ABL undetectable. In MMR   06/13/2016 Tumor Marker   BCR/ABL undetectable. In  MMR   12/06/2016 Pathology Results   BCR/ABL undetectable. In MMR   06/08/2017 Pathology Results   BCR/ABL undetectable. In MMR   09/08/2017 Pathology Results   BCR/ABL undetectable. In MMR   12/11/2017 Pathology Results   BCR/ABL undetectable. In MMR   03/19/2018 Pathology Results   BCR/ABL undetectable. In MMR   06/12/2018 Pathology Results   BCR/ABL undetectable. In MMR   09/06/2018 Pathology Results   BCR ABL is detectable. e13a2 (b2a2) and [id number]) IS: 0.009%   09/26/2018 -  Chemotherapy   The patient had Tasigna    11/21/2018 Pathology Results   BCR/ABL undetectable. In MMR   03/07/2019 Pathology Results   BCR/ABL undetectable. In MMR   06/17/2019 Pathology Results   BCR/ABL undetectable. In MMR     REVIEW OF SYSTEMS:   Constitutional: Denies fevers, chills or abnormal weight loss Eyes: Denies blurriness of vision Ears, nose, mouth, throat, and face: Denies mucositis or sore throat Respiratory: Denies cough, dyspnea or wheezes Cardiovascular: Denies palpitation, chest discomfort or lower extremity swelling Gastrointestinal:  Denies nausea, heartburn or change in bowel habits Skin: Denies abnormal skin rashes Lymphatics: Denies new lymphadenopathy or easy bruising Neurological:Denies numbness, tingling or new weaknesses Behavioral/Psych: Mood is stable, no new changes  All other systems were reviewed with the patient and are negative.  I have reviewed the past medical history, past surgical history, social history and family history with the patient and they are unchanged from previous note.  ALLERGIES:  is allergic to norvasc [amlodipine besylate] and sulfa antibiotics.  MEDICATIONS:  Current Outpatient Medications  Medication Sig Dispense Refill  . albuterol (PROVENTIL HFA;VENTOLIN HFA) 108 (90 Base) MCG/ACT inhaler Inhale 2 puffs into the lungs every 6 (six) hours as needed for wheezing or shortness of breath. 1 Inhaler 0  . albuterol (VENTOLIN HFA) 108 (90  Base) MCG/ACT inhaler Inhale 2 puffs into the lungs every 6 (six) hours as needed for wheezing or shortness of breath. 18 g 2  . atorvastatin (LIPITOR) 80 MG tablet TAKE 1 TABLET BY MOUTH ONCE DAILY AT  6 PM. 90 tablet 1  . carvedilol (COREG) 12.5 MG tablet Take 1 tablet (12.5 mg total) by mouth 2 (two) times daily. 180 tablet 3  . cetirizine (ZYRTEC) 10 MG tablet Take 1 tablet (10 mg total) by mouth daily. 30 tablet 0  . clopidogrel (PLAVIX) 75 MG tablet TAKE 1 TABLET BY MOUTH DAILY 90 tablet 3  . mexiletine (MEXITIL) 200 MG capsule Take 1 capsule (200 mg total) by mouth 2 (two) times daily. 180 capsule 3  . nilotinib (TASIGNA) 150 MG capsule Take 2 capsules (300 mg total) by mouth every 12 (twelve) hours. Take on an empty stomach, 1 hr before or 2 hrs after food. 120 capsule 10  . nitroGLYCERIN (NITROSTAT) 0.4 MG SL tablet DISSOLVE 1 TABLET UNDER THE TONGUE EVERY 5 MINUTES AS NEEDED FOR CHEST PAIN 25 tablet 4  . sildenafil (VIAGRA) 100 MG tablet Take 0.5-1 tablets (50-100 mg total) by mouth daily as needed for erectile dysfunction. 5 tablet 11  . valsartan (DIOVAN) 160 MG tablet Take 1 tablet by mouth once  daily 90 tablet 0  . zolpidem (AMBIEN) 10 MG tablet Take 1 tablet (10 mg total) by mouth at bedtime as needed for sleep. 15 tablet 1   No current facility-administered medications for this visit.     PHYSICAL EXAMINATION: ECOG PERFORMANCE STATUS: 0 - Asymptomatic GENERAL:alert, no distress and comfortable SKIN: skin color, texture, turgor are normal, no rashes or significant lesions EYES: normal, Conjunctiva are pink and non-injected, sclera clear OROPHARYNX:no exudate, no erythema and lips, buccal mucosa, and tongue normal  NECK: supple, thyroid normal size, non-tender, without nodularity LYMPH:  no palpable lymphadenopathy in the cervical, axillary or inguinal LUNGS: clear to auscultation and percussion with normal breathing effort HEART: regular rate & rhythm and no murmurs and no  lower extremity edema ABDOMEN:abdomen soft, non-tender and normal bowel sounds Musculoskeletal:no cyanosis of digits and no clubbing  NEURO: alert & oriented x 3 with fluent speech, no focal motor/sensory deficits  LABORATORY DATA:  I have reviewed the data as listed    Component Value Date/Time   NA 142 06/17/2019 1125   NA 142 06/08/2017 0832   K 4.2 06/17/2019 1125   K 3.9 06/08/2017 0832   CL 109 06/17/2019 1125   CL 110 (H) 11/14/2012 0918   CO2 26 06/17/2019 1125   CO2 25 06/08/2017 0832   GLUCOSE 122 (H) 06/17/2019 1125   GLUCOSE 98 06/08/2017 0832   GLUCOSE 108 (H) 11/14/2012 0918   BUN 12 06/17/2019 1125   BUN 11.6 06/08/2017 0832   CREATININE 0.91 06/17/2019 1125   CREATININE 0.90 05/31/2019 0837   CREATININE 0.9 06/08/2017 0832   CALCIUM 8.4 (L) 06/17/2019 1125   CALCIUM 8.6 06/08/2017 0832   PROT 6.3 (L) 06/17/2019 1125   PROT 6.7 06/08/2017 0832   ALBUMIN 3.3 (L) 06/17/2019 1125   ALBUMIN 3.4 (L) 06/08/2017 0832   AST 8 (L) 06/17/2019 1125   AST 13 06/08/2017 0832   ALT 7 06/17/2019 1125   ALT 7 06/08/2017 0832   ALKPHOS 89 06/17/2019 1125   ALKPHOS 96 06/08/2017 0832   BILITOT 0.6 06/17/2019 1125   BILITOT 0.44 06/08/2017 0832   GFRNONAA >60 06/17/2019 1125   GFRNONAA 83 05/31/2019 0837   GFRAA >60 06/17/2019 1125   GFRAA 96 05/31/2019 0837    No results found for: SPEP, UPEP  Lab Results  Component Value Date   WBC 6.9 06/17/2019   NEUTROABS 5.1 06/17/2019   HGB 11.5 (L) 06/17/2019   HCT 35.3 (L) 06/17/2019   MCV 96.2 06/17/2019   PLT 200 06/17/2019      Chemistry      Component Value Date/Time   NA 142 06/17/2019 1125   NA 142 06/08/2017 0832   K 4.2 06/17/2019 1125   K 3.9 06/08/2017 0832   CL 109 06/17/2019 1125   CL 110 (H) 11/14/2012 0918   CO2 26 06/17/2019 1125   CO2 25 06/08/2017 0832   BUN 12 06/17/2019 1125   BUN 11.6 06/08/2017 0832   CREATININE 0.91 06/17/2019 1125   CREATININE 0.90 05/31/2019 0837   CREATININE 0.9  06/08/2017 0832      Component Value Date/Time   CALCIUM 8.4 (L) 06/17/2019 1125   CALCIUM 8.6 06/08/2017 0832   ALKPHOS 89 06/17/2019 1125   ALKPHOS 96 06/08/2017 0832   AST 8 (L) 06/17/2019 1125   AST 13 06/08/2017 0832   ALT 7 06/17/2019 1125   ALT 7 06/08/2017 0832   BILITOT 0.6 06/17/2019 1125   BILITOT 0.44 06/08/2017 1962  All questions were answered. The patient knows to call the clinic with any problems, questions or concerns. No barriers to learning was detected.  I spent 10 minutes counseling the patient face to face. The total time spent in the appointment was 15 minutes and more than 50% was on counseling and review of test results  [name], MD 06/27/2019 9:58 AM

## 2019-06-28 ENCOUNTER — Telehealth: Payer: Self-pay | Admitting: Hematology and Oncology

## 2019-06-28 NOTE — Telephone Encounter (Signed)
Scheduled appt per 11/19 sch message- spoke with pt wife and she is aware of appt date and time

## 2019-07-17 ENCOUNTER — Other Ambulatory Visit: Payer: Self-pay | Admitting: Family Medicine

## 2019-07-23 ENCOUNTER — Other Ambulatory Visit: Payer: Self-pay | Admitting: Hematology and Oncology

## 2019-07-23 DIAGNOSIS — C9210 Chronic myeloid leukemia, BCR/ABL-positive, not having achieved remission: Secondary | ICD-10-CM

## 2019-07-25 MED FILL — TASIGNA 150 MG CAPSULE: 150 | 28 days supply | Qty: 112 | Fill #0

## 2019-08-22 MED FILL — TASIGNA 150 MG CAPSULE: 150 | 28 days supply | Qty: 112 | Fill #1

## 2019-08-27 ENCOUNTER — Telehealth: Payer: Self-pay

## 2019-08-27 NOTE — Telephone Encounter (Signed)
I called the pt to help him send a manual transmission. It gave him the error code 3248. I called [employer] to help him trouble shoot the monitor. [employer] is replacing the pt monitor. He should receive the new monitor in 7-10 business days.

## 2019-08-30 ENCOUNTER — Telehealth: Payer: Self-pay

## 2019-08-30 NOTE — Telephone Encounter (Signed)
The pt wife wanted to talk with Dr. Caryl Comes nurse about a medication change. I told her I will send a phone note and have the nurse give her a call back.

## 2019-08-30 NOTE — Telephone Encounter (Signed)
Pts wife states her husbands insurance is not going to approve mexiletine after his last refill. I advised her I would send to our nurse who works with pre-authorizations for this medication. If not, we will forward to Dr. Caryl Comes for further recommendation.

## 2019-09-02 ENCOUNTER — Ambulatory Visit (INDEPENDENT_AMBULATORY_CARE_PROVIDER_SITE_OTHER): Payer: Medicare HMO | Admitting: *Deleted

## 2019-09-02 DIAGNOSIS — I255 Ischemic cardiomyopathy: Secondary | ICD-10-CM

## 2019-09-02 LAB — CUP PACEART REMOTE DEVICE CHECK
Battery Remaining Longevity: 76 mo
Battery Voltage: 2.99 V
Brady Statistic RV Percent Paced: 0.04 %
Date Time Interrogation Session: 20210125112557
HighPow Impedance: 53 Ohm
Implantable Lead Implant Date: 20151014
Implantable Lead Location: 753860
Implantable Pulse Generator Implant Date: 20151014
Lead Channel Impedance Value: 361 Ohm
Lead Channel Impedance Value: 418 Ohm
Lead Channel Pacing Threshold Amplitude: 0.75 V
Lead Channel Pacing Threshold Pulse Width: 0.4 ms
Lead Channel Sensing Intrinsic Amplitude: 11 mV
Lead Channel Sensing Intrinsic Amplitude: 11 mV
Lead Channel Setting Pacing Amplitude: 2 V
Lead Channel Setting Pacing Pulse Width: 0.4 ms
Lead Channel Setting Sensing Sensitivity: 0.3 mV

## 2019-09-02 MED ORDER — MEXILETINE HCL 200 MG PO CAPS: 200.0000 mg | ORAL_CAPSULE | Freq: Two times a day (BID) | ORAL | 1 refills | Status: DC

## 2019-09-02 NOTE — Telephone Encounter (Signed)
**Note De-Identified Seline Enzor Obfuscation** I called [facility] and was advised that a Mexiletine PA is not required by ins and that the pts last refill was $24.04/30 day supplyand that it looks like all he needs is for [place] to send in a refill as he is out.  I have sent a Mexiletine 200 mg # 180 with 1 refill.  I called the pt and his wife and advised of above. The pts wife states that someone from [facility] told her that they are no longer covering Mexiletine and that they are going to be notifying us of this as well.  I have advised her that as soon as I hear from [facility] I will address whatever is needed. She verbalized understanding and thanked me for calling her back.

## 2019-09-03 NOTE — Telephone Encounter (Signed)
Thanks SK   

## 2019-09-16 ENCOUNTER — Other Ambulatory Visit: Payer: Self-pay | Admitting: Cardiology

## 2019-09-16 ENCOUNTER — Other Ambulatory Visit: Payer: Self-pay | Admitting: Internal Medicine

## 2019-09-16 ENCOUNTER — Telehealth: Payer: Self-pay

## 2019-09-16 MED ORDER — NITROGLYCERIN 0.4 MG SL SUBL: 0.4000 mg | SUBLINGUAL_TABLET | SUBLINGUAL | 4 refills | Status: DC | PRN

## 2019-09-16 NOTE — Telephone Encounter (Signed)
Spoke with pt who states he lost his bottle of nitro pills yesterday.  States medication expired in 2019.  Pt denies and active CP or other cardiac Sx.  Uses [facility] on [address].  Will forward information to Cecilie Kicks, NP for review.  Pt verbalizes understanding and agrees with current plan.

## 2019-09-16 NOTE — Telephone Encounter (Signed)
The pt wife states the pt needs a new prescription of nitron glycerin. I told her I will have Dr. Caryl Comes nurse give her a call back.

## 2019-09-17 ENCOUNTER — Telehealth: Payer: Self-pay

## 2019-09-17 NOTE — Telephone Encounter (Signed)
Do not see where transmission was requested by our [facility]. Last transmission received on 09/02/19. Reviewed transmission received 09/17/19, no episodes or abnormalities noted. OptiVol stable.

## 2019-09-17 NOTE — Telephone Encounter (Signed)
The pt states someone called and asked for a manual transmission. I help him with that. Transmission received.

## 2019-09-19 ENCOUNTER — Telehealth: Payer: Self-pay

## 2019-09-19 NOTE — Telephone Encounter (Signed)
Patient is approved for assistance through [employer] PAF for Tasigna 09/19/19-08/07/20.  PAF phone [phone]  Anthony Wolfe Patient [place] Phone [phone] Fax [phone] 09/19/2019 3:27 PM

## 2019-09-19 NOTE — Telephone Encounter (Signed)
Oral Oncology Patient Advocate Encounter  Sent an online application for [employer] Patient [place] (NPA[employer]) in an effort to reduce the patient's out of pocket expense for Tasigna to $0.    MD portion of application completed and faxed to [phone].   [employer]PA[employer] phone number for follow up is [phone].   This encounter will be updated until final determination.   [place] Patient [place] Phone [phone] Fax [phone] 09/19/2019 11:27 AM

## 2019-09-19 NOTE — Telephone Encounter (Signed)
Pt wife state the pt Carvedilol was declined at the pharmacy and would like for dr. Caryl Comes nurse to give her a call back.

## 2019-09-20 ENCOUNTER — Other Ambulatory Visit: Payer: Self-pay | Admitting: Internal Medicine

## 2019-09-20 MED ORDER — CARVEDILOL 12.5 MG PO TABS: 12.5000 mg | ORAL_TABLET | Freq: Two times a day (BID) | ORAL | 1 refills | Status: DC

## 2019-09-20 NOTE — Telephone Encounter (Signed)
Spoke with pt and pt's wife who states pt's Carvedilol was denied this week.  Spoke with Simon Rhein in refills who states Cardvedilol should have been approved and she will request refill now.  Pt's wife advised medication has been approved and sent to [facility].  Pt's wife verbalizes understanding and agrees with plan.  Pt's wife thanked Therapist, sports.

## 2019-10-08 ENCOUNTER — Other Ambulatory Visit: Payer: Self-pay | Admitting: Family Medicine

## 2019-10-14 ENCOUNTER — Inpatient Hospital Stay: Payer: Medicare HMO | Attending: Hematology and Oncology

## 2019-10-14 ENCOUNTER — Other Ambulatory Visit: Payer: Self-pay

## 2019-10-14 DIAGNOSIS — I255 Ischemic cardiomyopathy: Secondary | ICD-10-CM | POA: Insufficient documentation

## 2019-10-14 DIAGNOSIS — C9210 Chronic myeloid leukemia, BCR/ABL-positive, not having achieved remission: Secondary | ICD-10-CM | POA: Diagnosis not present

## 2019-10-14 DIAGNOSIS — Z79899 Other long term (current) drug therapy: Secondary | ICD-10-CM | POA: Insufficient documentation

## 2019-10-14 DIAGNOSIS — D6481 Anemia due to antineoplastic chemotherapy: Secondary | ICD-10-CM | POA: Diagnosis not present

## 2019-10-14 DIAGNOSIS — I10 Essential (primary) hypertension: Secondary | ICD-10-CM | POA: Diagnosis not present

## 2019-10-14 LAB — CBC WITH DIFFERENTIAL/PLATELET
Abs Immature Granulocytes: 0.03 10*3/uL (ref 0.00–0.07)
Basophils Absolute: 0.1 10*3/uL (ref 0.0–0.1)
Basophils Relative: 1 %
Eosinophils Absolute: 0.1 10*3/uL (ref 0.0–0.5)
Eosinophils Relative: 1 %
HCT: 34.9 % — ABNORMAL LOW (ref 39.0–52.0)
Hemoglobin: 11.1 g/dL — ABNORMAL LOW (ref 13.0–17.0)
Immature Granulocytes: 1 %
Lymphocytes Relative: 15 %
Lymphs Abs: 0.9 10*3/uL (ref 0.7–4.0)
MCH: 29.5 pg (ref 26.0–34.0)
MCHC: 31.8 g/dL (ref 30.0–36.0)
MCV: 92.8 fL (ref 80.0–100.0)
Monocytes Absolute: 0.6 10*3/uL (ref 0.1–1.0)
Monocytes Relative: 9 %
Neutro Abs: 4.6 10*3/uL (ref 1.7–7.7)
Neutrophils Relative %: 73 %
Platelets: 204 10*3/uL (ref 150–400)
RBC: 3.76 MIL/uL — ABNORMAL LOW (ref 4.22–5.81)
RDW: 14.6 % (ref 11.5–15.5)
WBC: 6.3 10*3/uL (ref 4.0–10.5)
nRBC: 0 % (ref 0.0–0.2)

## 2019-10-14 LAB — COMPREHENSIVE METABOLIC PANEL
ALT: 7 U/L (ref 0–44)
AST: 8 U/L — ABNORMAL LOW (ref 15–41)
Albumin: 3.1 g/dL — ABNORMAL LOW (ref 3.5–5.0)
Alkaline Phosphatase: 95 U/L (ref 38–126)
Anion gap: 8 (ref 5–15)
BUN: 12 mg/dL (ref 8–23)
CO2: 27 mmol/L (ref 22–32)
Calcium: 8.5 mg/dL — ABNORMAL LOW (ref 8.9–10.3)
Chloride: 108 mmol/L (ref 98–111)
Creatinine, Ser: 0.94 mg/dL (ref 0.61–1.24)
GFR calc Af Amer: >60 mL/min (ref >60)
GFR calc non Af Amer: >60 mL/min (ref >60)
Glucose, Bld: 114 mg/dL — ABNORMAL HIGH (ref 70–99)
Potassium: 3.9 mmol/L (ref 3.5–5.1)
Sodium: 143 mmol/L (ref 135–145)
Total Bilirubin: 0.4 mg/dL (ref 0.3–1.2)
Total Protein: 6 g/dL — ABNORMAL LOW (ref 6.5–8.1)

## 2019-10-18 LAB — BCR/ABL

## 2019-10-24 ENCOUNTER — Inpatient Hospital Stay: Payer: Medicare HMO | Admitting: Hematology and Oncology

## 2019-10-24 ENCOUNTER — Telehealth: Payer: Self-pay | Admitting: Hematology and Oncology

## 2019-10-24 ENCOUNTER — Encounter: Payer: Self-pay | Admitting: Hematology and Oncology

## 2019-10-24 ENCOUNTER — Other Ambulatory Visit: Payer: Self-pay

## 2019-10-24 DIAGNOSIS — I255 Ischemic cardiomyopathy: Secondary | ICD-10-CM | POA: Diagnosis not present

## 2019-10-24 DIAGNOSIS — I10 Essential (primary) hypertension: Secondary | ICD-10-CM | POA: Diagnosis not present

## 2019-10-24 DIAGNOSIS — T451X5A Adverse effect of antineoplastic and immunosuppressive drugs, initial encounter: Secondary | ICD-10-CM | POA: Diagnosis not present

## 2019-10-24 DIAGNOSIS — C9210 Chronic myeloid leukemia, BCR/ABL-positive, not having achieved remission: Secondary | ICD-10-CM | POA: Diagnosis not present

## 2019-10-24 DIAGNOSIS — Z79899 Other long term (current) drug therapy: Secondary | ICD-10-CM | POA: Diagnosis not present

## 2019-10-24 DIAGNOSIS — D6481 Anemia due to antineoplastic chemotherapy: Secondary | ICD-10-CM

## 2019-10-24 NOTE — Telephone Encounter (Signed)
Scheduled per 3/18 sch msg. Called and spoke with pt, confirmed 6/8 and 6/18 appts

## 2019-10-24 NOTE — Assessment & Plan Note (Signed)
He continues on aggressive medical management He has no recent exacerbation of congestive heart failure Continue medical management I notice his blood pressure is elevated but could be due to anxiety He is noted to have gained a lot of weight I recommend him to be mindful about his diet and weight in the future especially with history of ischemic cardiomyopathy that could contribute to poor outcome

## 2019-10-24 NOTE — Progress Notes (Signed)
[place] OFFICE PROGRESS NOTE  Patient Care Team: [name], MD as PCP - General (Family Medicine) [name]., MD as Attending Physician (Cardiology) [name], MD (Family Medicine) [name], MD (Cardiothoracic Surgery) Early, Arvilla Meres, MD as Attending Physician (Vascular Surgery) [name], MD as Consulting Physician (Hematology and Oncology) Deboraha Sprang, MD as Consulting Physician (Cardiology)  ASSESSMENT & PLAN:  Chronic myelogenous leukemia So far, he tolerated chemotherapy very well without major side effects I reviewed recent blood work and gave him a copy of the test results The patient has achieved complete response on chemotherapy However, BCR able is mildly detected compared to previous results The goal of treatment would be palliative and indefinite treatment withTasigna due to relapse while on active surveillance He will continue treatment indefinitely I will see him back in 3 months for further follow-up  There is no contraindication for him to proceed with Covid vaccination  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 The patient denies recent history of bleeding such as epistaxis, hematuria or hematochezia. He is asymptomatic from the anemia. We will observe for now.  Cardiomyopathy, ischemic He continues on aggressive medical management He has no recent exacerbation of congestive heart failure Continue medical management I notice his blood pressure is elevated but could be due to anxiety He is noted to have gained a lot of weight I recommend him to be mindful about his diet and weight in the future especially with history of ischemic cardiomyopathy that could contribute to poor outcome   No orders of the defined types were placed in this encounter.   All questions were answered. The patient knows to call the clinic with any problems, questions or concerns. The total  time spent in the appointment was 20 minutes encounter with patients including review of chart and various tests results, discussions about plan of care and coordination of care plan   [name], MD 10/24/2019 2:44 PM  INTERVAL HISTORY: Please see below for problem oriented charting. He returns with his wife for further follow-up He is compliant taking all his medications as directed He denies missing doses I noticed that he has gained a lot of weight since last time I saw him He denies recent exacerbation of congestive heart failure He has shortness of breath on moderate exertion No recent cough  SUMMARY OF ONCOLOGIC HISTORY: Oncology History  Chronic myelogenous leukemia ([place])  05/04/2012 Bone Marrow Biopsy   BM confirmed diagnosis of CML in Chronic phase. BCR/ABL by PCR detected abnormalitis with b2a2 & b3a2 subtypes   05/09/2012 - 03/16/2015 Chemotherapy   He was started on treatment with Dasatinib 100 mg daily   06/06/2012 Adverse Reaction   Dose of medication was reduced to 50 mg daily due to pancytopenia   01/24/2013 Progression   Patient was noted to have elevated blood count which and was subsequently found to be noncompliant to treatment. The patient has not been on treatment for several months because his prescription ran out. He was restarted back on treatment   01/16/2014 Progression   Bcr/ABL by PCR is worse. Dose of Dasatinib was increased to 100 mg.   02/26/2014 Tumor Marker   Blood work for ABL kinase mutation was negative.   10/29/2014 Tumor Marker   BCR/ABL b2a2 & b3a2 0.29%, IS 0.1624%, not in MMR yet but improving   11/05/2014 Adverse Reaction   He had thoracentesis due to pleural effusion   11/27/2014 Surgery  He underwent right video-assisted thoracoscopy, Drainage of pleural effusion, Pleural biopsy, Diaphragm biopsy, Lung biopsy & [place] pleurodesis   01/30/2015 Tumor Marker   BCR/ABL b2a2 & b3a2 0.78%, IS 0.4368%, not in MMR    03/11/2015 Pathology Results    BCR/ABL b2a2 0.19%, IS 0.1064%, not in MMR    03/16/2015 Adverse Reaction   Dasatinib was stopped due to recurrent pleural effusion   03/28/2015 - 11/06/2017 Chemotherapy   He started on Bosutinib   03/30/2015 Procedure   He has therapeutic thoracentesis of the right lung with 1 liter of fluid removed   03/30/2015 Adverse Reaction   Bosutinib is placed on hold, to be restart on 8/25 at 250 mg due to severe diarrhea   04/28/2015 Tumor Marker   BCR/ABL b2a2 0.06%, IS 0.0336%, in MMR    06/10/2015 Tumor Marker   BCR/ABL b2a2 0.14%, IS 0.1162%, not in MMR    08/14/2015 Pathology Results   BCR/ABL b2a2 0.02%, IS 0.0166%, In MMR    11/09/2015 Tumor Marker   BCR/ABL b2a2 0.005%, IS 0.0042%, In MMR    02/08/2016 Tumor Marker   BCR/ABL undetectable. In MMR   06/13/2016 Tumor Marker   BCR/ABL undetectable. In MMR   12/06/2016 Pathology Results   BCR/ABL undetectable. In MMR   06/08/2017 Pathology Results   BCR/ABL undetectable. In MMR   09/08/2017 Pathology Results   BCR/ABL undetectable. In MMR   12/11/2017 Pathology Results   BCR/ABL undetectable. In MMR   03/19/2018 Pathology Results   BCR/ABL undetectable. In MMR   06/12/2018 Pathology Results   BCR/ABL undetectable. In MMR   09/06/2018 Pathology Results   BCR ABL is detectable. e13a2 (b2a2) and [id number]) IS: 0.009%   09/26/2018 -  Chemotherapy   The patient had Tasigna    11/21/2018 Pathology Results   BCR/ABL undetectable. In MMR   03/07/2019 Pathology Results   BCR/ABL undetectable. In MMR   06/17/2019 Pathology Results   BCR/ABL undetectable. In MMR   10/14/2019 Pathology Results   BCR ABL is detectable. e13a2 (b2a2) and C12X5(T7G0) IS: 0.0281%. In MMR     REVIEW OF SYSTEMS:   Constitutional: Denies fevers, chills or abnormal weight loss Eyes: Denies blurriness of vision Ears, nose, mouth, throat, and face: Denies mucositis or sore throat Respiratory: Denies cough, dyspnea or wheezes Cardiovascular: Denies palpitation,  chest discomfort or lower extremity swelling Gastrointestinal:  Denies nausea, heartburn or change in bowel habits Skin: Denies abnormal skin rashes Lymphatics: Denies new lymphadenopathy or easy bruising Neurological:Denies numbness, tingling or new weaknesses Behavioral/Psych: Mood is stable, no new changes  All other systems were reviewed with the patient and are negative.  I have reviewed the past medical history, past surgical history, social history and family history with the patient and they are unchanged from previous note.  ALLERGIES:  is allergic to norvasc [amlodipine besylate] and sulfa antibiotics.  MEDICATIONS:  Current Outpatient Medications  Medication Sig Dispense Refill  . albuterol (PROVENTIL HFA;VENTOLIN HFA) 108 (90 Base) MCG/ACT inhaler Inhale 2 puffs into the lungs every 6 (six) hours as needed for wheezing or shortness of breath. 1 Inhaler 0  . albuterol (VENTOLIN HFA) 108 (90 Base) MCG/ACT inhaler Inhale 2 puffs into the lungs every 6 (six) hours as needed for wheezing or shortness of breath. 18 g 2  . atorvastatin (LIPITOR) 80 MG tablet TAKE ONE TABLET BY MOUTH ONCE DAILY AT 6 PM. 90 tablet 3  . carvedilol (COREG) 12.5 MG tablet Take 1 tablet (12.5 mg total) by mouth 2 (two)  times daily. 180 tablet 1  . clopidogrel (PLAVIX) 75 MG tablet Take 1 tablet by mouth daily 90 tablet 3  . mexiletine (MEXITIL) 200 MG capsule Take 1 capsule (200 mg total) by mouth 2 (two) times daily. 180 capsule 1  . nitroGLYCERIN (NITROSTAT) 0.4 MG SL tablet Place 1 tablet (0.4 mg total) under the tongue every 5 (five) minutes as needed for chest pain. 25 tablet 4  . sildenafil (VIAGRA) 100 MG tablet Take 0.5-1 tablets (50-100 mg total) by mouth daily as needed for erectile dysfunction. 5 tablet 11  . TASIGNA 150 MG capsule TAKE 2 CAPSULES (300 MG TOTAL) BY MOUTH EVERY 12 (TWELVE) HOURS. TAKE ON AN EMPTY STOMACH, 1 HR BEFORE OR 2 HRS AFTER FOOD. 112 capsule 10  . valsartan (DIOVAN) 160 MG  tablet Take 1 tablet by mouth once daily 90 tablet 0   No current facility-administered medications for this visit.    PHYSICAL EXAMINATION: ECOG PERFORMANCE STATUS: 1 - Symptomatic but completely ambulatory  Vitals:   10/24/19 1027  BP: (!) 161/64  Pulse: 72  Resp: 18  Temp: 98.5 F (36.9 C)  SpO2: 99%   Filed Weights   10/24/19 1027  Weight: 175 lb 9.6 oz (79.7 kg)    GENERAL:alert, no distress and comfortable SKIN: skin color, texture, turgor are normal, no rashes or significant lesions EYES: normal, Conjunctiva are pink and non-injected, sclera clear OROPHARYNX:no exudate, no erythema and lips, buccal mucosa, and tongue normal  NECK: supple, thyroid normal size, non-tender, without nodularity LYMPH:  no palpable lymphadenopathy in the cervical, axillary or inguinal LUNGS: clear to auscultation and percussion with normal breathing effort HEART: regular rate & rhythm and no murmurs and no lower extremity edema ABDOMEN:abdomen soft, non-tender and normal bowel sounds Musculoskeletal:no cyanosis of digits and no clubbing  NEURO: alert & oriented x 3 with fluent speech, no focal motor/sensory deficits  LABORATORY DATA:  I have reviewed the data as listed    Component Value Date/Time   NA 143 10/14/2019 1025   NA 142 06/08/2017 0832   K 3.9 10/14/2019 1025   K 3.9 06/08/2017 0832   CL 108 10/14/2019 1025   CL 110 (H) 11/14/2012 0918   CO2 27 10/14/2019 1025   CO2 25 06/08/2017 0832   GLUCOSE 114 (H) 10/14/2019 1025   GLUCOSE 98 06/08/2017 0832   GLUCOSE 108 (H) 11/14/2012 0918   BUN 12 10/14/2019 1025   BUN 11.6 06/08/2017 0832   CREATININE 0.94 10/14/2019 1025   CREATININE 0.90 05/31/2019 0837   CREATININE 0.9 06/08/2017 0832   CALCIUM 8.5 (L) 10/14/2019 1025   CALCIUM 8.6 06/08/2017 0832   PROT 6.0 (L) 10/14/2019 1025   PROT 6.7 06/08/2017 0832   ALBUMIN 3.1 (L) 10/14/2019 1025   ALBUMIN 3.4 (L) 06/08/2017 0832   AST 8 (L) 10/14/2019 1025   AST 13  06/08/2017 0832   ALT 7 10/14/2019 1025   ALT 7 06/08/2017 0832   ALKPHOS 95 10/14/2019 1025   ALKPHOS 96 06/08/2017 0832   BILITOT 0.4 10/14/2019 1025   BILITOT 0.44 06/08/2017 0832   GFRNONAA >60 10/14/2019 1025   GFRNONAA 83 05/31/2019 0837   GFRAA >60 10/14/2019 1025   GFRAA 96 05/31/2019 0837    No results found for: SPEP, UPEP  Lab Results  Component Value Date   WBC 6.3 10/14/2019   NEUTROABS 4.6 10/14/2019   HGB 11.1 (L) 10/14/2019   HCT 34.9 (L) 10/14/2019   MCV 92.8 10/14/2019   PLT 204  10/14/2019      Chemistry      Component Value Date/Time   NA 143 10/14/2019 1025   NA 142 06/08/2017 0832   K 3.9 10/14/2019 1025   K 3.9 06/08/2017 0832   CL 108 10/14/2019 1025   CL 110 (H) 11/14/2012 0918   CO2 27 10/14/2019 1025   CO2 25 06/08/2017 0832   BUN 12 10/14/2019 1025   BUN 11.6 06/08/2017 0832   CREATININE 0.94 10/14/2019 1025   CREATININE 0.90 05/31/2019 0837   CREATININE 0.9 06/08/2017 0832      Component Value Date/Time   CALCIUM 8.5 (L) 10/14/2019 1025   CALCIUM 8.6 06/08/2017 0832   ALKPHOS 95 10/14/2019 1025   ALKPHOS 96 06/08/2017 0832   AST 8 (L) 10/14/2019 1025   AST 13 06/08/2017 0832   ALT 7 10/14/2019 1025   ALT 7 06/08/2017 0832   BILITOT 0.4 10/14/2019 1025   BILITOT 0.44 06/08/2017 2091

## 2019-10-24 NOTE — Assessment & Plan Note (Signed)
This is likely anemia of chronic disease. The patient denies recent history of bleeding such as epistaxis, hematuria or hematochezia. He is asymptomatic from the anemia. We will observe for now.  

## 2019-10-24 NOTE — Assessment & Plan Note (Addendum)
So far, he tolerated chemotherapy very well without major side effects I reviewed recent blood work and gave him a copy of the test results The patient has achieved complete response on chemotherapy However, BCR able is mildly detected compared to previous results The goal of treatment would be palliative and indefinite treatment withTasigna due to relapse while on active surveillance He will continue treatment indefinitely I will see him back in 3 months for further follow-up  There is no contraindication for him to proceed with Covid vaccination

## 2019-11-06 ENCOUNTER — Other Ambulatory Visit: Payer: Self-pay | Admitting: Family Medicine

## 2019-11-10 DIAGNOSIS — Z95810 Presence of automatic (implantable) cardiac defibrillator: Secondary | ICD-10-CM | POA: Insufficient documentation

## 2019-11-11 ENCOUNTER — Other Ambulatory Visit: Payer: Self-pay

## 2019-11-11 ENCOUNTER — Encounter: Payer: Self-pay | Admitting: Internal Medicine

## 2019-11-11 ENCOUNTER — Ambulatory Visit: Payer: Medicare HMO | Admitting: Internal Medicine

## 2019-11-11 VITALS — BP 136/80 | HR 79 | Ht 69.0 in | Wt 175.6 lb

## 2019-11-11 DIAGNOSIS — R0600 Dyspnea, unspecified: Secondary | ICD-10-CM | POA: Diagnosis not present

## 2019-11-11 DIAGNOSIS — Z95810 Presence of automatic (implantable) cardiac defibrillator: Secondary | ICD-10-CM

## 2019-11-11 DIAGNOSIS — I255 Ischemic cardiomyopathy: Secondary | ICD-10-CM | POA: Diagnosis not present

## 2019-11-11 DIAGNOSIS — R079 Chest pain, unspecified: Secondary | ICD-10-CM

## 2019-11-11 DIAGNOSIS — R0789 Other chest pain: Secondary | ICD-10-CM

## 2019-11-11 DIAGNOSIS — I472 Ventricular tachycardia, unspecified: Secondary | ICD-10-CM

## 2019-11-11 DIAGNOSIS — Z79899 Other long term (current) drug therapy: Secondary | ICD-10-CM

## 2019-11-11 MED ORDER — FUROSEMIDE 20 MG PO TABS: 20.0000 mg | ORAL_TABLET | ORAL | 3 refills | Status: DC

## 2019-11-11 NOTE — Progress Notes (Signed)
.      Patient Care Team: [name], MD as PCP - General (Family Medicine) [name]., MD as Attending Physician (Cardiology) [name], MD (Family Medicine) [name], MD (Cardiothoracic Surgery) Early, Arvilla Meres, MD as Attending Physician (Vascular Surgery) [name], MD as Consulting Physician (Hematology and Oncology) Deboraha Sprang, MD as Consulting Physician (Cardiology)   HPI  Anthony Wolfe is a 74 y.o. male Seen in follow-up for an ICD implanted for secondary prevention after he presented with ventricular tachycardia 10/15. Catheterization demonstrated occluded vein graft to his RCA ejection fraction was about 40%  He is status post bypass surgery 2010  He underwent EP testing and was found to be inducible.  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   He underwent a thoracentesis initially in 11/15 and redo 12/15.  He saw pulm 1/16 with question as to whether immunotherpay could be explanation of effusion;  He had pleurodesis.   DATE TEST    9/14    Myoview   EF ?? No ischemia  3/171 Carotids R ICA 40-59%//L ICA- T   10/17    Echo   EF 45-50 % LAE 46/2.26/35        He has had one asymptomatic episode of treated ventricular tachycardia November 2018.  Complaining of increasing dyspnea on exertion over the last 4-5 months.  Accompanied by chest pressure.  No peripheral edema.  No recumbent dyspnea.  Does notice that his heart skips beats frequently while at rest.  Antiarrhythmics Date Reason stopped  amio  intolerant  [place]  Insurance refusal        He is on aspirin and Plavix.  He has a history of remote TIA in addition to his coronary disease.    Date Cr K Mg Hgb  5/18  1.1 3.7     11/18 0.9 3.9 2.3 11.5  1/20 1.05 4.3  12.8  3/21 0.94 3.9 1.8 11.1     Past Medical History:  Diagnosis Date  . AICD (automatic  cardioverter/defibrillator) present   . Allergic rhinitis   . 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 Anxiety   . Blood dyscrasia    cmleukemia  . C. difficile colitis 06/16/2014  . CAD (coronary artery disease)    a. s/p 3v CABG 2010. b. NSTEMI 05/2014 in setting of VT. Cath impressions: "Recent IMI with occluded SVG to RCA. Has Right to Right collaterals and collaterals from septal and OM. EF 40%."  . Carotid disease, bilateral ([place])    a. Right CEA 2002; known occluded left carotid. b. Duplex 06/2014: known occluded LICA, stable [id number] RICA s/p CEA.  . Chronic systolic CHF (congestive heart failure) ([place])   . CML (chronic myelocytic leukemia) ([place]) 05/09/2012  . Depression   . Emphysema of lung ([place])   . HTN (hypertension)   . Hyperlipidemia   . Ischemic cardiomyopathy    a. Prior EF 36%. b. 2014: 50%. c. 05/2014: EF 35-40% by echo, 40% with inf HK by cath.  . NSTEMI (non-ST elevation myocardial infarction) ([place]) 05/2014  . Pleural effusion 05/2014  . Pneumonia 05/23/2014  . Presence of permanent cardiac pacemaker   . Prostate hypertrophy   . PVC's (premature ventricular contractions)    a. PVC's with prior Holter showing PVC load of 22%.  . Shortness of breath dyspnea   . Stroke ([place]) 01  . TIA (transient ischemic attack)    ASPVD,  S./P. right CEA, 2002, and known occluded left carotid  . Ventricular tachycardia ([place])    a. Admitted with VT 05/2013 - EPS with inducible sustained monomorphic VT; s/p Medtronic ICD 05/21/14.    Past Surgical History:  Procedure Laterality Date  . BACK SURGERY    . CAROTID ENDARTERECTOMY Right 01  . carotidectomy  2003   right side  . COLONOSCOPY  07/2010  . CORONARY ARTERY BYPASS GRAFT  02/2009   3 vessel  . ELECTROPHYSIOLOGIC STUDY  05/21/14  . ELECTROPHYSIOLOGY STUDY N/A 05/21/2014   Procedure: ELECTROPHYSIOLOGY STUDY;  Surgeon: Deboraha Sprang, MD;  Location: [facility] CATH LAB;  Service: Cardiovascular;  Laterality: N/A;  . EP IMPLANTABLE  DEVICE  05/21/14   single chamber Metronic ICD  . LEFT HEART CATHETERIZATION WITH CORONARY/GRAFT ANGIOGRAM N/A 05/19/2014   Procedure: LEFT HEART CATHETERIZATION WITH Beatrix Fetters;  Surgeon: Josue Hector, MD;  Location: [facility] CATH LAB;  Service: Cardiovascular;  Laterality: N/A;  . PLEURAL BIOPSY N/A 11/27/2014   Procedure: PLEURAL BIOPSY;  Surgeon: [name], MD;  Location: [place];  Service: Thoracic;  Laterality: N/A;  . PLEURAL EFFUSION DRAINAGE Right 11/27/2014   Procedure: DRAINAGE OF PLEURAL EFFUSION;  Surgeon: [name], MD;  Location: [place];  Service: Thoracic;  Laterality: Right;  . TALC PLEURODESIS Right 11/27/2014   Procedure: Pietro Cassis;  Surgeon: [name], MD;  Location: [place];  Service: Thoracic;  Laterality: Right;  Marland Kitchen VIDEO ASSISTED THORACOSCOPY Right 11/27/2014   Procedure: RIGHT VIDEO ASSISTED THORACOSCOPY;  Surgeon: [name], MD;  Location: [place];  Service: Thoracic;  Laterality: Right;    Current Outpatient Medications  Medication Sig Dispense Refill  . albuterol (PROVENTIL HFA;VENTOLIN HFA) 108 (90 Base) MCG/ACT inhaler Inhale 2 puffs into the lungs every 6 (six) hours as needed for wheezing or shortness of breath. 1 Inhaler 0  . albuterol (VENTOLIN HFA) 108 (90 Base) MCG/ACT inhaler Inhale 2 puffs into the lungs every 6 (six) hours as needed for wheezing or shortness of breath. 18 g 2  . atorvastatin (LIPITOR) 80 MG tablet TAKE ONE TABLET BY MOUTH ONCE DAILY AT 6 PM. 90 tablet 3  . carvedilol (COREG) 12.5 MG tablet Take 1 tablet (12.5 mg total) by mouth 2 (two) times daily. 180 tablet 1  . clopidogrel (PLAVIX) 75 MG tablet Take 1 tablet by mouth daily 90 tablet 3  . mexiletine (MEXITIL) 200 MG capsule Take 1 capsule (200 mg total) by mouth 2 (two) times daily. 180 capsule 1  . nitroGLYCERIN (NITROSTAT) 0.4 MG SL tablet Place 1 tablet (0.4 mg total) under the tongue every 5 (five) minutes as needed for chest pain. 25  tablet 4  . sildenafil (VIAGRA) 100 MG tablet Take 0.5-1 tablets (50-100 mg total) by mouth daily as needed for erectile dysfunction. 5 tablet 11  . TASIGNA 150 MG capsule TAKE 2 CAPSULES (300 MG TOTAL) BY MOUTH EVERY 12 (TWELVE) HOURS. TAKE ON AN EMPTY STOMACH, 1 HR BEFORE OR 2 HRS AFTER FOOD. 112 capsule 10  . valsartan (DIOVAN) 160 MG tablet Take 1 tablet by mouth once daily 90 tablet 3   No current facility-administered medications for this visit.    Allergies  Allergen Reactions  . Norvasc [Amlodipine Besylate] Rash    rash  . Sulfa Antibiotics Hives    Hives     Review of Systems negative except from HPI and PMH  Physical Exam BP 136/80   Pulse 79   Ht 5\' 9"  (  1.753 m)   Wt 175 lb 9.6 oz (79.7 kg)   SpO2 98%   BMI 25.93 kg/m  Well developed and well nourished in no acute distress HENT normal Neck supple with JVP-8-10 Clear Device pocket well healed; without hematoma or erythema.  There is no tethering  Regular rate and rhythm, no  gallop No murmur Abd-soft with active BS No Clubbing cyanosis tr edema Skin-warm and dry A & Oriented  Grossly normal sensory and motor function  ECG *sinus at 77 Intervals 20/14/42 Axis left -62  Repolarization abnormalities are unchanged from 2020.  QRS duration has widened from 122--140 ms  assessment and  Plan  Ventricular tachycardia-non sustained  Exertional chest discomfort  Dyspnea on exertion  Syncope - no recurrent   ischemic cardiomyopathy depressed LV function and prior bypass  Implantable defibrillator-Medtronic  Carotid Stenosis  Known L occlusion    CHF-chronic-systolic-exacerbation  CML  TIA  Skips consistent with PVCs   No intercurrent Ventricular tachycardia-treated, but nonsustained   One episode declared by his device as VT by electrogram morphology appears to be SVT  Has worsening dyspnea on exertion.  Possibly CHF  has had a complicated history including thoracenteses for recurrent effusions  requiring pleurodesis.  Has ischemic heart disease and accompanying chest discomfort.  We will undertake Myoview scanning to exclude coronary disease.  Chest CT scan to look at lungs and echocardiogram to assess left ventricular function.  He is volume overloaded and will start him on low-dose furosemide 20 mg.  He will need a metabolic profile in about 2 weeks time.  His PVC burden may be sufficient to contribute to cardiomyopathy.  Will reassess LV function, if it is change, will need to assess PVC burden; unfortunately, we do not have great medical options given his intolerance of amiodarone and his insurance companies unwillingness to support his need for mexiletine.

## 2019-11-11 NOTE — Patient Instructions (Addendum)
Medication Instructions:  Your physician has recommended you make the following change in your medication:   **  Begin taking Furosemide (Lasix) 20mg  by mouth every other day  Labwork: BMET in 2 weeks (See appointment scheduled)  Testing/Procedures: Your physician has requested that you have a lexiscan myoview. For further information please visit [email or web address]. Please follow instruction sheet, as given.  CT- CHEST - Non-Cardiac CT scanning, (CAT scanning), is a noninvasive, special x-ray that produces cross-sectional images of the body using x-rays and a computer. CT scans help physicians diagnose and treat medical conditions. For some CT exams, a contrast material is used to enhance visibility in the area of the body being studied. CT scans provide greater clarity and reveal more details than regular x-ray exams.  Your physician has requested that you have an echocardiogram. Echocardiography is a painless test that uses sound waves to create images of your heart. It provides your doctor with information about the size and shape of your heart and how well your heart's chambers and valves are working. This procedure takes approximately one hour. There are no restrictions for this procedure.   Follow-Up: 3 months with Dr Radford Pax  Your physician wants you to follow-up in: 12 months with Dr Caryl Comes. You will receive a reminder letter in the mail two months in advance. If you don't receive a letter, please call our office to schedule the follow-up appointment.  Remote monitoring is used to monitor your Pacemaker of ICD from home. This monitoring reduces the number of office visits required to check your device to one time per year. It allows [place] to keep an eye on the functioning of your device to ensure it is working properly.   Any Other Special Instructions Will Be Listed Below (If  Applicable).     If you need a refill on your cardiac medications before your next appointment, please  call your pharmacy.

## 2019-11-21 ENCOUNTER — Telehealth (HOSPITAL_COMMUNITY): Payer: Self-pay | Admitting: *Deleted

## 2019-11-21 NOTE — Telephone Encounter (Signed)
Left message on voicemail in reference to upcoming appointment scheduled for 11/27/19. Phone number given for a call back so details instructions can be given.  Kirstie Peri

## 2019-11-25 ENCOUNTER — Other Ambulatory Visit: Payer: Medicare HMO

## 2019-11-26 ENCOUNTER — Telehealth (HOSPITAL_COMMUNITY): Payer: Self-pay | Admitting: Internal Medicine

## 2019-11-26 NOTE — Telephone Encounter (Signed)
Patient cancelled Myoview and doesn't wish to have per cancellation.

## 2019-11-27 ENCOUNTER — Other Ambulatory Visit (HOSPITAL_COMMUNITY): Payer: Medicare HMO

## 2019-11-27 ENCOUNTER — Encounter (HOSPITAL_COMMUNITY): Payer: Medicare HMO

## 2019-12-02 ENCOUNTER — Ambulatory Visit (INDEPENDENT_AMBULATORY_CARE_PROVIDER_SITE_OTHER): Payer: Medicare HMO | Admitting: *Deleted

## 2019-12-02 DIAGNOSIS — I255 Ischemic cardiomyopathy: Secondary | ICD-10-CM | POA: Diagnosis not present

## 2019-12-02 LAB — CUP PACEART REMOTE DEVICE CHECK
Battery Remaining Longevity: 70 mo
Battery Voltage: 2.99 V
Brady Statistic RV Percent Paced: 0.01 %
Date Time Interrogation Session: 20210426012303
HighPow Impedance: 54 Ohm
Implantable Lead Implant Date: 20151014
Implantable Lead Location: 753860
Implantable Pulse Generator Implant Date: 20151014
Lead Channel Impedance Value: 361 Ohm
Lead Channel Impedance Value: 418 Ohm
Lead Channel Pacing Threshold Amplitude: 0.75 V
Lead Channel Pacing Threshold Pulse Width: 0.4 ms
Lead Channel Sensing Intrinsic Amplitude: 10.375 mV
Lead Channel Sensing Intrinsic Amplitude: 10.375 mV
Lead Channel Setting Pacing Amplitude: 2 V
Lead Channel Setting Pacing Pulse Width: 0.4 ms
Lead Channel Setting Sensing Sensitivity: 0.3 mV

## 2019-12-02 NOTE — Progress Notes (Signed)
ICD Remote  

## 2019-12-03 ENCOUNTER — Telehealth: Payer: Self-pay | Admitting: Family Medicine

## 2019-12-03 DIAGNOSIS — Z8673 Personal history of transient ischemic attack (TIA), and cerebral infarction without residual deficits: Secondary | ICD-10-CM

## 2019-12-03 DIAGNOSIS — G459 Transient cerebral ischemic attack, unspecified: Secondary | ICD-10-CM

## 2019-12-03 NOTE — Telephone Encounter (Signed)
Patient needs referral to his cardiologist to check the veins in his neck again  [phone]

## 2019-12-03 NOTE — Telephone Encounter (Signed)
He has a history of right CEA.  Carotid dopplers.

## 2019-12-03 NOTE — Telephone Encounter (Signed)
Do you know what he is referring to?

## 2019-12-04 NOTE — Telephone Encounter (Signed)
Order placed

## 2019-12-19 ENCOUNTER — Telehealth: Payer: Self-pay | Admitting: Family Medicine

## 2019-12-19 NOTE — Chronic Care Management (AMB) (Signed)
  Chronic Care Management   Note  12/19/2019 Name: Anthony Wolfe MRN: [id number] DOB: 20-Jul-1946  Anthony Wolfe is a 74 y.o. year old male who is a primary care patient of [name], MD. I reached out to [name] by phone today in response to a referral sent by Anthony Wolfe PCP, [name], MD.   Anthony Wolfe was given information about Chronic Care Management services today including:  1. CCM service includes personalized support from designated clinical staff supervised by his physician, including individualized plan of care and coordination with other care providers 2. 24/7 contact phone numbers for assistance for urgent and routine care needs. 3. Service will only be billed when office clinical staff spend 20 minutes or more in a month to coordinate care. 4. Only one practitioner may furnish and bill the service in a calendar month. 5. The patient may stop CCM services at any time (effective at the end of the month) by phone call to the office staff.   Patient agreed to services and verbal consent obtained.   Follow up plan:   [place]

## 2020-01-07 ENCOUNTER — Telehealth (HOSPITAL_COMMUNITY): Payer: Self-pay | Admitting: Internal Medicine

## 2020-01-07 NOTE — Telephone Encounter (Signed)
I called patient to schedule the echocardiogram that Dr. Caryl Comes order and patient cancelled in April.  Patient states that he doesn't need test and will not schedule any tet except the Carotid.  I will remove order from the Will and if patient decided to have in the future we can reinstate the order .

## 2020-01-14 ENCOUNTER — Inpatient Hospital Stay: Payer: Medicare HMO | Attending: Hematology and Oncology

## 2020-01-14 ENCOUNTER — Other Ambulatory Visit: Payer: Self-pay

## 2020-01-14 DIAGNOSIS — I255 Ischemic cardiomyopathy: Secondary | ICD-10-CM | POA: Diagnosis not present

## 2020-01-14 DIAGNOSIS — Z79899 Other long term (current) drug therapy: Secondary | ICD-10-CM | POA: Diagnosis not present

## 2020-01-14 DIAGNOSIS — J90 Pleural effusion, not elsewhere classified: Secondary | ICD-10-CM | POA: Insufficient documentation

## 2020-01-14 DIAGNOSIS — C9210 Chronic myeloid leukemia, BCR/ABL-positive, not having achieved remission: Secondary | ICD-10-CM | POA: Diagnosis not present

## 2020-01-14 DIAGNOSIS — R197 Diarrhea, unspecified: Secondary | ICD-10-CM | POA: Insufficient documentation

## 2020-01-14 LAB — CBC WITH DIFFERENTIAL/PLATELET
Abs Immature Granulocytes: 0.02 10*3/uL (ref 0.00–0.07)
Basophils Absolute: 0.1 10*3/uL (ref 0.0–0.1)
Basophils Relative: 1 %
Eosinophils Absolute: 0.1 10*3/uL (ref 0.0–0.5)
Eosinophils Relative: 2 %
HCT: 38.2 % — ABNORMAL LOW (ref 39.0–52.0)
Hemoglobin: 12.2 g/dL — ABNORMAL LOW (ref 13.0–17.0)
Immature Granulocytes: 0 %
Lymphocytes Relative: 17 %
Lymphs Abs: 1 10*3/uL (ref 0.7–4.0)
MCH: 30.2 pg (ref 26.0–34.0)
MCHC: 31.9 g/dL (ref 30.0–36.0)
MCV: 94.6 fL (ref 80.0–100.0)
Monocytes Absolute: 0.5 10*3/uL (ref 0.1–1.0)
Monocytes Relative: 9 %
Neutro Abs: 4.1 10*3/uL (ref 1.7–7.7)
Neutrophils Relative %: 71 %
Platelets: 202 10*3/uL (ref 150–400)
RBC: 4.04 MIL/uL — ABNORMAL LOW (ref 4.22–5.81)
RDW: 14.3 % (ref 11.5–15.5)
WBC: 5.7 10*3/uL (ref 4.0–10.5)
nRBC: 0 % (ref 0.0–0.2)

## 2020-01-14 LAB — COMPREHENSIVE METABOLIC PANEL
ALT: 10 U/L (ref 0–44)
AST: 12 U/L — ABNORMAL LOW (ref 15–41)
Albumin: 3.4 g/dL — ABNORMAL LOW (ref 3.5–5.0)
Alkaline Phosphatase: 85 U/L (ref 38–126)
Anion gap: 8 (ref 5–15)
BUN: 12 mg/dL (ref 8–23)
CO2: 26 mmol/L (ref 22–32)
Calcium: 8.7 mg/dL — ABNORMAL LOW (ref 8.9–10.3)
Chloride: 110 mmol/L (ref 98–111)
Creatinine, Ser: 0.89 mg/dL (ref 0.61–1.24)
GFR calc Af Amer: >60 mL/min (ref >60)
GFR calc non Af Amer: >60 mL/min (ref >60)
Glucose, Bld: 98 mg/dL (ref 70–99)
Potassium: 4 mmol/L (ref 3.5–5.1)
Sodium: 144 mmol/L (ref 135–145)
Total Bilirubin: 0.6 mg/dL (ref 0.3–1.2)
Total Protein: 6.7 g/dL (ref 6.5–8.1)

## 2020-01-20 ENCOUNTER — Telehealth: Payer: Self-pay

## 2020-01-20 NOTE — Telephone Encounter (Signed)
Called and given below message. He verbalized understanding  Rescheduled to 6/23 at 940 am. He is aware of time.

## 2020-01-20 NOTE — Telephone Encounter (Signed)
-----   Message from [name], MD sent at 01/20/2020 10:30 AM EDT ----- Regarding: friday is overbooked, can he moved to next week?

## 2020-01-23 LAB — BCR/ABL

## 2020-01-24 ENCOUNTER — Ambulatory Visit: Payer: Medicare HMO | Admitting: Hematology and Oncology

## 2020-01-29 ENCOUNTER — Encounter: Payer: Self-pay | Admitting: Hematology and Oncology

## 2020-01-29 ENCOUNTER — Inpatient Hospital Stay: Payer: Medicare HMO | Admitting: Hematology and Oncology

## 2020-01-29 ENCOUNTER — Other Ambulatory Visit: Payer: Self-pay

## 2020-01-29 VITALS — BP 173/77 | HR 70 | Temp 98.2°F | Resp 18 | Ht 69.0 in | Wt 180.5 lb

## 2020-01-29 DIAGNOSIS — I255 Ischemic cardiomyopathy: Secondary | ICD-10-CM

## 2020-01-29 DIAGNOSIS — C9210 Chronic myeloid leukemia, BCR/ABL-positive, not having achieved remission: Secondary | ICD-10-CM

## 2020-01-29 DIAGNOSIS — Z91199 Patient's noncompliance with other medical treatment and regimen due to unspecified reason: Secondary | ICD-10-CM | POA: Insufficient documentation

## 2020-01-29 DIAGNOSIS — J90 Pleural effusion, not elsewhere classified: Secondary | ICD-10-CM | POA: Diagnosis not present

## 2020-01-29 DIAGNOSIS — Z79899 Other long term (current) drug therapy: Secondary | ICD-10-CM | POA: Diagnosis not present

## 2020-01-29 DIAGNOSIS — R197 Diarrhea, unspecified: Secondary | ICD-10-CM | POA: Diagnosis not present

## 2020-01-29 DIAGNOSIS — Z9119 Patient's noncompliance with other medical treatment and regimen: Secondary | ICD-10-CM

## 2020-01-29 NOTE — Progress Notes (Signed)
[facility] OFFICE PROGRESS NOTE  Patient Care Team: [name], MD as PCP - General (Family Medicine) [name]., MD as Attending Physician (Cardiology) [name], MD (Family Medicine) [name], MD (Cardiothoracic Surgery) Early, Arvilla Meres, MD as Attending Physician (Vascular Surgery) [name], MD as Consulting Physician (Hematology and Oncology) Deboraha Sprang, MD as Consulting Physician (Cardiology) [name], [facility] as Pharmacist (Pharmacist)  ASSESSMENT & PLAN:  Chronic myelogenous leukemia Thankfully, he is still in remission I am shocked to learn that he ran out of his prescription 2 months ago The prescription order is still valid He would not tell me or say why he did not call us for refills His wife stated she is in charge of all his prescription I spent a lot of time educating the patient and his wife the importance of staying on his current medications The patient have history of intolerance of Sprycel due to pleural effusion.  He has severe, uncontrolled diarrhea with bosutinib He tolerated Nilotinib well.  I do not want him to develop resistance due to medical noncompliance I will get assistance from my nursing staff and pharmacy to get his medication refill ASAP I will get my nursing staff to call him once a month to make sure that he stay compliant with his medication I plan to see him back in 3 months for further follow-up   Cardiomyopathy, ischemic He continues on aggressive medical management He has no recent exacerbation of congestive heart failure Continue medical management I notice his blood pressure is elevated but could be due to anxiety   History of noncompliance with medical treatment I am concerned about his approach to medical compliance I will get my nursing staff to call him monthly to make sure he gets his medication refill appropriately   Orders Placed This Encounter  Procedures  . CBC  with Differential/Platelet    Standing Status:   Standing    Number of Occurrences:   22    Standing Expiration Date:   01/28/2021  . Comprehensive metabolic panel    Standing Status:   Standing    Number of Occurrences:   33    Standing Expiration Date:   01/28/2021    All questions were answered. The patient knows to call the clinic with any problems, questions or concerns. The total time spent in the appointment was 20 minutes encounter with patients including review of chart and various tests results, discussions about plan of care and coordination of care plan   [name], MD 01/29/2020 9:58 AM  INTERVAL HISTORY: Please see below for problem oriented charting. Returns with his wife for further follow-up The patient ran out of prescription medication on April 10 He has not taken any pills since then The patient thought that his pharmacy refused to refill his prescription He did not reach out to [place] His wife did not know about any of this He denies recent infection, fever or chills No recent exacerbation of congestive heart failure  SUMMARY OF ONCOLOGIC HISTORY: Oncology History  Chronic myelogenous leukemia ([name])  05/04/2012 Bone Marrow Biopsy   BM confirmed diagnosis of CML in Chronic phase. BCR/ABL by PCR detected abnormalitis with b2a2 & b3a2 subtypes   05/09/2012 - 03/16/2015 Chemotherapy   He was started on treatment with Dasatinib 100 mg daily   06/06/2012 Adverse Reaction   Dose of medication was reduced to 50 mg daily due to pancytopenia   01/24/2013 Progression   Patient  was noted to have elevated blood count which and was subsequently found to be noncompliant to treatment. The patient has not been on treatment for several months because his prescription ran out. He was restarted back on treatment   01/16/2014 Progression   Bcr/ABL by PCR is worse. Dose of Dasatinib was increased to 100 mg.   02/26/2014 Tumor Marker   Blood work for ABL kinase mutation was negative.    10/29/2014 Tumor Marker   BCR/ABL b2a2 & b3a2 0.29%, IS 0.1624%, not in MMR yet but improving   11/05/2014 Adverse Reaction   He had thoracentesis due to pleural effusion   11/27/2014 Surgery   He underwent right video-assisted thoracoscopy, Drainage of pleural effusion, Pleural biopsy, Diaphragm biopsy, Lung biopsy & [place]c pleurodesis   01/30/2015 Tumor Marker   BCR/ABL b2a2 & b3a2 0.78%, IS 0.4368%, not in MMR    03/11/2015 Pathology Results   BCR/ABL b2a2 0.19%, IS 0.1064%, not in MMR    03/16/2015 Adverse Reaction   Dasatinib was stopped due to recurrent pleural effusion   03/28/2015 - 11/06/2017 Chemotherapy   He started on Bosutinib   03/30/2015 Procedure   He has therapeutic thoracentesis of the right lung with 1 liter of fluid removed   03/30/2015 Adverse Reaction   Bosutinib is placed on hold, to be restart on 8/25 at 250 mg due to severe diarrhea   04/28/2015 Tumor Marker   BCR/ABL b2a2 0.06%, IS 0.0336%, in MMR    06/10/2015 Tumor Marker   BCR/ABL b2a2 0.14%, IS 0.1162%, not in MMR    08/14/2015 Pathology Results   BCR/ABL b2a2 0.02%, IS 0.0166%, In MMR    11/09/2015 Tumor Marker   BCR/ABL b2a2 0.005%, IS 0.0042%, In MMR    02/08/2016 Tumor Marker   BCR/ABL undetectable. In MMR   06/13/2016 Tumor Marker   BCR/ABL undetectable. In MMR   12/06/2016 Pathology Results   BCR/ABL undetectable. In MMR   06/08/2017 Pathology Results   BCR/ABL undetectable. In MMR   09/08/2017 Pathology Results   BCR/ABL undetectable. In MMR   12/11/2017 Pathology Results   BCR/ABL undetectable. In MMR   03/19/2018 Pathology Results   BCR/ABL undetectable. In MMR   06/12/2018 Pathology Results   BCR/ABL undetectable. In MMR   09/06/2018 Pathology Results   BCR ABL is detectable. e13a2 (b2a2) and [id number]) IS: 0.009%   09/26/2018 -  Chemotherapy   The patient had Tasigna    11/21/2018 Pathology Results   BCR/ABL undetectable. In MMR   03/07/2019 Pathology Results   BCR/ABL undetectable. In  MMR   06/17/2019 Pathology Results   BCR/ABL undetectable. In MMR   10/14/2019 Pathology Results   BCR ABL is detectable. e13a2 (b2a2) and W58K9(X8P3) IS: 0.0281%. In MMR   01/14/2020 Pathology Results   BCR ABL is detectable. e13a2 (b2a2) and A25K5(L9J6) IS: 0.023%. In MMR     REVIEW OF SYSTEMS:   Constitutional: Denies fevers, chills or abnormal weight loss Eyes: Denies blurriness of vision Ears, nose, mouth, throat, and face: Denies mucositis or sore throat Respiratory: Denies cough, dyspnea or wheezes Cardiovascular: Denies palpitation, chest discomfort or lower extremity swelling Gastrointestinal:  Denies nausea, heartburn or change in bowel habits Skin: Denies abnormal skin rashes Lymphatics: Denies new lymphadenopathy or easy bruising Neurological:Denies numbness, tingling or new weaknesses Behavioral/Psych: Mood is stable, no new changes  All other systems were reviewed with the patient and are negative.  I have reviewed the past medical history, past surgical history, social history and family history with the  patient and they are unchanged from previous note.  ALLERGIES:  is allergic to norvasc [amlodipine besylate] and sulfa antibiotics.  MEDICATIONS:  Current Outpatient Medications  Medication Sig Dispense Refill  . albuterol (VENTOLIN HFA) 108 (90 Base) MCG/ACT inhaler Inhale 2 puffs into the lungs every 6 (six) hours as needed for wheezing or shortness of breath. 18 g 2  . atorvastatin (LIPITOR) 80 MG tablet TAKE ONE TABLET BY MOUTH ONCE DAILY AT 6 PM. 90 tablet 3  . carvedilol (COREG) 12.5 MG tablet Take 1 tablet (12.5 mg total) by mouth 2 (two) times daily. 180 tablet 1  . clopidogrel (PLAVIX) 75 MG tablet Take 1 tablet by mouth daily 90 tablet 3  . furosemide (LASIX) 20 MG tablet Take 1 tablet (20 mg total) by mouth every other day. 45 tablet 3  . nitroGLYCERIN (NITROSTAT) 0.4 MG SL tablet Place 1 tablet (0.4 mg total) under the tongue every 5 (five) minutes as  needed for chest pain. 25 tablet 4  . sildenafil (VIAGRA) 100 MG tablet Take 0.5-1 tablets (50-100 mg total) by mouth daily as needed for erectile dysfunction. 5 tablet 11  . TASIGNA 150 MG capsule TAKE 2 CAPSULES (300 MG TOTAL) BY MOUTH EVERY 12 (TWELVE) HOURS. TAKE ON AN EMPTY STOMACH, 1 HR BEFORE OR 2 HRS AFTER FOOD. 112 capsule 10  . valsartan (DIOVAN) 160 MG tablet Take 1 tablet by mouth once daily 90 tablet 3   No current facility-administered medications for this visit.    PHYSICAL EXAMINATION: ECOG PERFORMANCE STATUS: 0 - Asymptomatic  Vitals:   01/29/20 0939  BP: (!) 173/77  Pulse: 70  Resp: 18  Temp: 98.2 F (36.8 C)  SpO2: 99%   Filed Weights   01/29/20 0939  Weight: 180 lb 8 oz (81.9 kg)    GENERAL:alert, no distress and comfortable NEURO: alert & oriented x 3 with fluent speech, no focal motor/sensory deficits  LABORATORY DATA:  I have reviewed the data as listed    Component Value Date/Time   NA 144 01/14/2020 0938   NA 142 06/08/2017 0832   K 4.0 01/14/2020 0938   K 3.9 06/08/2017 0832   CL 110 01/14/2020 0938   CL 110 (H) 11/14/2012 0918   CO2 26 01/14/2020 0938   CO2 25 06/08/2017 0832   GLUCOSE 98 01/14/2020 0938   GLUCOSE 98 06/08/2017 0832   GLUCOSE 108 (H) 11/14/2012 0918   BUN 12 01/14/2020 0938   BUN 11.6 06/08/2017 0832   CREATININE 0.89 01/14/2020 0938   CREATININE 0.90 05/31/2019 0837   CREATININE 0.9 06/08/2017 0832   CALCIUM 8.7 (L) 01/14/2020 0938   CALCIUM 8.6 06/08/2017 0832   PROT 6.7 01/14/2020 0938   PROT 6.7 06/08/2017 0832   ALBUMIN 3.4 (L) 01/14/2020 0938   ALBUMIN 3.4 (L) 06/08/2017 0832   AST 12 (L) 01/14/2020 0938   AST 13 06/08/2017 0832   ALT 10 01/14/2020 0938   ALT 7 06/08/2017 0832   ALKPHOS 85 01/14/2020 0938   ALKPHOS 96 06/08/2017 0832   BILITOT 0.6 01/14/2020 0938   BILITOT 0.44 06/08/2017 0832   GFRNONAA >60 01/14/2020 0938   GFRNONAA 83 05/31/2019 0837   GFRAA >60 01/14/2020 0938   GFRAA 96 05/31/2019  0837    No results found for: SPEP, UPEP  Lab Results  Component Value Date   WBC 5.7 01/14/2020   NEUTROABS 4.1 01/14/2020   HGB 12.2 (L) 01/14/2020   HCT 38.2 (L) 01/14/2020   MCV 94.6 01/14/2020  PLT 202 01/14/2020      Chemistry      Component Value Date/Time   NA 144 01/14/2020 0938   NA 142 06/08/2017 0832   K 4.0 01/14/2020 0938   K 3.9 06/08/2017 0832   CL 110 01/14/2020 0938   CL 110 (H) 11/14/2012 0918   CO2 26 01/14/2020 0938   CO2 25 06/08/2017 0832   BUN 12 01/14/2020 0938   BUN 11.6 06/08/2017 0832   CREATININE 0.89 01/14/2020 0938   CREATININE 0.90 05/31/2019 0837   CREATININE 0.9 06/08/2017 0832      Component Value Date/Time   CALCIUM 8.7 (L) 01/14/2020 0938   CALCIUM 8.6 06/08/2017 0832   ALKPHOS 85 01/14/2020 0938   ALKPHOS 96 06/08/2017 0832   AST 12 (L) 01/14/2020 0938   AST 13 06/08/2017 0832   ALT 10 01/14/2020 0938   ALT 7 06/08/2017 0832   BILITOT 0.6 01/14/2020 0938   BILITOT 0.44 06/08/2017 8185

## 2020-01-29 NOTE — Assessment & Plan Note (Signed)
He continues on aggressive medical management He has no recent exacerbation of congestive heart failure Continue medical management I notice his blood pressure is elevated but could be due to anxiety

## 2020-01-29 NOTE — Assessment & Plan Note (Signed)
I am concerned about his approach to medical compliance I will get my nursing staff to call him monthly to make sure he gets his medication refill appropriately

## 2020-01-29 NOTE — Assessment & Plan Note (Signed)
Thankfully, he is still in remission I am shocked to learn that he ran out of his prescription 2 months ago The prescription order is still valid He would not tell me or say why he did not call us for refills His wife stated she is in charge of all his prescription I spent a lot of time educating the patient and his wife the importance of staying on his current medications The patient have history of intolerance of Sprycel due to pleural effusion.  He has severe, uncontrolled diarrhea with bosutinib He tolerated Nilotinib well.  I do not want him to develop resistance due to medical noncompliance I will get assistance from my nursing staff and pharmacy to get his medication refill ASAP I will get my nursing staff to call him once a month to make sure that he stay compliant with his medication I plan to see him back in 3 months for further follow-up

## 2020-01-31 ENCOUNTER — Telehealth: Payer: Medicare HMO

## 2020-02-13 ENCOUNTER — Other Ambulatory Visit: Payer: Self-pay | Admitting: Internal Medicine

## 2020-02-20 DIAGNOSIS — H02831 Dermatochalasis of right upper eyelid: Secondary | ICD-10-CM | POA: Diagnosis not present

## 2020-02-20 DIAGNOSIS — H02834 Dermatochalasis of left upper eyelid: Secondary | ICD-10-CM | POA: Diagnosis not present

## 2020-02-20 DIAGNOSIS — H2513 Age-related nuclear cataract, bilateral: Secondary | ICD-10-CM | POA: Diagnosis not present

## 2020-02-20 DIAGNOSIS — H0102A Squamous blepharitis right eye, upper and lower eyelids: Secondary | ICD-10-CM | POA: Diagnosis not present

## 2020-02-20 DIAGNOSIS — H0102B Squamous blepharitis left eye, upper and lower eyelids: Secondary | ICD-10-CM | POA: Diagnosis not present

## 2020-02-20 DIAGNOSIS — H15113 Episcleritis periodica fugax, bilateral: Secondary | ICD-10-CM | POA: Diagnosis not present

## 2020-02-24 DIAGNOSIS — H0102B Squamous blepharitis left eye, upper and lower eyelids: Secondary | ICD-10-CM | POA: Diagnosis not present

## 2020-02-24 DIAGNOSIS — H2513 Age-related nuclear cataract, bilateral: Secondary | ICD-10-CM | POA: Diagnosis not present

## 2020-02-24 DIAGNOSIS — H0102A Squamous blepharitis right eye, upper and lower eyelids: Secondary | ICD-10-CM | POA: Diagnosis not present

## 2020-02-24 DIAGNOSIS — H02831 Dermatochalasis of right upper eyelid: Secondary | ICD-10-CM | POA: Diagnosis not present

## 2020-02-24 DIAGNOSIS — H15113 Episcleritis periodica fugax, bilateral: Secondary | ICD-10-CM | POA: Diagnosis not present

## 2020-02-24 DIAGNOSIS — H02834 Dermatochalasis of left upper eyelid: Secondary | ICD-10-CM | POA: Diagnosis not present

## 2020-02-26 ENCOUNTER — Other Ambulatory Visit: Payer: Self-pay | Admitting: Family Medicine

## 2020-03-02 ENCOUNTER — Telehealth: Payer: Self-pay

## 2020-03-02 ENCOUNTER — Ambulatory Visit (INDEPENDENT_AMBULATORY_CARE_PROVIDER_SITE_OTHER): Payer: Medicare HMO | Admitting: *Deleted

## 2020-03-02 DIAGNOSIS — I472 Ventricular tachycardia, unspecified: Secondary | ICD-10-CM

## 2020-03-02 LAB — CUP PACEART REMOTE DEVICE CHECK
Battery Remaining Longevity: 62 mo
Battery Voltage: 2.99 V
Brady Statistic RV Percent Paced: 0.01 %
Date Time Interrogation Session: 20210726033622
HighPow Impedance: 57 Ohm
Implantable Lead Implant Date: 20151014
Implantable Lead Location: 753860
Implantable Pulse Generator Implant Date: 20151014
Lead Channel Impedance Value: 361 Ohm
Lead Channel Impedance Value: 399 Ohm
Lead Channel Pacing Threshold Amplitude: 0.75 V
Lead Channel Pacing Threshold Pulse Width: 0.4 ms
Lead Channel Sensing Intrinsic Amplitude: 9.625 mV
Lead Channel Sensing Intrinsic Amplitude: 9.625 mV
Lead Channel Setting Pacing Amplitude: 2 V
Lead Channel Setting Pacing Pulse Width: 0.4 ms
Lead Channel Setting Sensing Sensitivity: 0.3 mV

## 2020-03-02 NOTE — Telephone Encounter (Signed)
Called and given below message to wife. They got the delivery of medication and he is taking it currently. Ask wife to call the office if help needed getting Rx. She verbalized understanding and has the number for the pharamacy.

## 2020-03-02 NOTE — Telephone Encounter (Signed)
-----   Message from [name], MD sent at 03/02/2020  7:50 AM EDT ----- Regarding: can you call and make sure he has his pills delivered? He missed several months of treatment recently

## 2020-03-04 NOTE — Progress Notes (Signed)
Remote ICD transmission.   

## 2020-03-09 ENCOUNTER — Other Ambulatory Visit: Payer: Self-pay | Admitting: Internal Medicine

## 2020-03-09 ENCOUNTER — Other Ambulatory Visit: Payer: Self-pay

## 2020-03-09 ENCOUNTER — Other Ambulatory Visit: Payer: Medicare HMO

## 2020-03-11 ENCOUNTER — Other Ambulatory Visit: Payer: Self-pay | Admitting: Internal Medicine

## 2020-03-12 ENCOUNTER — Other Ambulatory Visit: Payer: Self-pay | Admitting: Internal Medicine

## 2020-03-13 ENCOUNTER — Other Ambulatory Visit: Payer: Self-pay | Admitting: Internal Medicine

## 2020-03-16 ENCOUNTER — Telehealth: Payer: Self-pay

## 2020-03-16 NOTE — Telephone Encounter (Signed)
The pt states he told Dr. Caryl Comes he was not taking Mexiletine. He states he made a mistake and is still taking Mexiletine 200 mg. The pt states he needs a refill on the medication. I told him I will let Dr. Caryl Comes and his nurse know.

## 2020-03-18 ENCOUNTER — Other Ambulatory Visit: Payer: Self-pay | Admitting: Internal Medicine

## 2020-03-19 ENCOUNTER — Other Ambulatory Visit: Payer: Self-pay | Admitting: Internal Medicine

## 2020-03-26 ENCOUNTER — Other Ambulatory Visit: Payer: Self-pay

## 2020-03-26 ENCOUNTER — Ambulatory Visit (INDEPENDENT_AMBULATORY_CARE_PROVIDER_SITE_OTHER): Payer: Medicare HMO | Admitting: Family Medicine

## 2020-03-26 VITALS — BP 140/70 | HR 92 | Temp 97.3°F | Ht 69.0 in | Wt 174.0 lb

## 2020-03-26 DIAGNOSIS — I255 Ischemic cardiomyopathy: Secondary | ICD-10-CM

## 2020-03-26 DIAGNOSIS — C9210 Chronic myeloid leukemia, BCR/ABL-positive, not having achieved remission: Secondary | ICD-10-CM

## 2020-03-26 DIAGNOSIS — Z125 Encounter for screening for malignant neoplasm of prostate: Secondary | ICD-10-CM

## 2020-03-26 DIAGNOSIS — J438 Other emphysema: Secondary | ICD-10-CM | POA: Diagnosis not present

## 2020-03-26 DIAGNOSIS — I429 Cardiomyopathy, unspecified: Secondary | ICD-10-CM | POA: Diagnosis not present

## 2020-03-26 DIAGNOSIS — I509 Heart failure, unspecified: Secondary | ICD-10-CM

## 2020-03-26 DIAGNOSIS — Z8673 Personal history of transient ischemic attack (TIA), and cerebral infarction without residual deficits: Secondary | ICD-10-CM | POA: Diagnosis not present

## 2020-03-26 DIAGNOSIS — Z0001 Encounter for general adult medical examination with abnormal findings: Secondary | ICD-10-CM | POA: Diagnosis not present

## 2020-03-26 DIAGNOSIS — I6521 Occlusion and stenosis of right carotid artery: Secondary | ICD-10-CM

## 2020-03-26 DIAGNOSIS — Z8679 Personal history of other diseases of the circulatory system: Secondary | ICD-10-CM | POA: Diagnosis not present

## 2020-03-26 LAB — LIPID PANEL
Cholesterol: 91 mg/dL (ref <200)
HDL: 33 mg/dL — ABNORMAL LOW (ref >=40)
LDL Cholesterol (Calc): 42 mg/dL (calc)
Non-HDL Cholesterol (Calc): 58 mg/dL (calc) (ref <130)
Total CHOL/HDL Ratio: 2.8 (calc) (ref <5.0)
Triglycerides: 75 mg/dL (ref <150)

## 2020-03-26 LAB — PSA: PSA: 1.8 ng/mL (ref <=4.0)

## 2020-03-26 MED ORDER — MEXILETINE HCL 200 MG PO CAPS: 200.0000 mg | ORAL_CAPSULE | Freq: Two times a day (BID) | ORAL | 5 refills | Status: DC

## 2020-03-26 NOTE — Progress Notes (Signed)
Subjective:    Patient ID: Anthony Wolfe, male    DOB: July 16, 1946, 74 y.o.   MRN: [id number]  05/31/19 Patient is a very pleasant 74 year old Caucasian male here today for regular checkup.  He has a history of coronary artery disease status post three-vessel CABG in 2010.  He also had a non-ST elevation myocardial infarction in 2015.  He also has ischemic cardiomyopathy with an ejection fraction of 40%.  He was unable to tolerate Entresto due to hypotension.  However he is on carvedilol as well as valsartan.  His blood pressure is reasonably well controlled at 144/70.  He denies any chest pain shortness of breath or dyspnea on exertion.  In fact this week both he and his wife were splitting firewood and doing vigorous physical activity with no deleterious effects.  He also has a history of a right carotid endarterectomy.  On exam today he does have a known bruit however he had a vascular ultrasound in March that showed no significant carotid artery stenosis.  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  He denies any stroke or TIA symptoms.  He is still taking his Plavix for secondary prevention of stroke as well as heart disease.  He is overdue for colonoscopy but he refuses this.  His pneumonia vaccines and flu shot are up-to-date.  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  I treated this with liquid nitrogen cryotherapy.  The lesion is approximately 5 mm in diameter.  It is a scaly hard papule.  If this does not improve, I would recommend seeing dermatology for excision.  At that time, my plan was: Physical exam today is normal aside from his right carotid bruit.  He is completely asymptomatic.  He is on both a beta-blocker as well as an angiotensin receptor blocker.  He could not tolerate Entresto.  I will check a CMP to monitor his electrolytes as well as a fasting lipid panel.  Given his history of cardiovascular disease including myocardial  infarction and TIA, I would recommend an LDL cholesterol less than 70.  He is on antiplatelet therapy with Plavix and is compliant with this.  He also has emphysema however he is only using albuterol as needed.  He does not benefit from triple therapy as we have tried this in the past.  His immunizations are up-to-date.  He did request a refill on his albuterol that he uses as needed for wheezing.  He also requested a prescription for something to help him sleep that he can take on occasion.  Therefore I gave him Ambien 10 mg.  He can take 1/2 to 1 tablet as needed for insomnia.  Again I treated the lesion on the superior aspect of his left equal helix with liquid nitrogen cryotherapy for 30 seconds.  If persistent I would recommend a dermatology consultation.  He refuses a colonoscopy  03/26/20 Here for CPE. Patient also has a history of CML. He has not had his shingles vaccine. He has not had his Covid vaccine. I spent more than 10 minutes today with both the patient and his wife discussing the Covid vaccine. I would strongly, strongly recommend the Covid vaccine. I explained that if he were to catch Covid, he would be at much higher risk for morbidity and mortality. However patient is afraid of the vaccine and refuses to take it. He is due for colonoscopy. He is due for prostate cancer screening. He  is also due for the shingles vaccine. He denies any falls, depression, or memory loss. I reviewed his CBC and CMP from June from his oncologist office and these look excellent. He is still due to check a prostate cancer screening test/PSA as well as a fasting lipid panel Past Medical History:  Diagnosis Date  . AICD (automatic cardioverter/defibrillator) present   . Allergic rhinitis   . 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 Anxiety   . Blood dyscrasia    cmleukemia  . C. difficile colitis 06/16/2014  . CAD (coronary artery disease)    a. s/p 3v CABG 2010. b. NSTEMI 05/2014 in setting of VT. Cath  impressions: "Recent IMI with occluded SVG to RCA. Has Right to Right collaterals and collaterals from septal and OM. EF 40%."  . Carotid disease, bilateral ([place])    a. Right CEA 2002; known occluded left carotid. b. Duplex 06/2014: known occluded LICA, stable 8-52% RICA s/p CEA.  . Chronic systolic CHF (congestive heart failure) ([place])   . 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 Depression   . Emphysema of lung ([place])   . HTN (hypertension)   . Hyperlipidemia   . Ischemic cardiomyopathy    a. Prior EF 36%. b. 2014: 50%. c. 05/2014: EF 35-40% by echo, 40% with inf HK by cath.  . NSTEMI (non-ST elevation myocardial infarction) ([place]) 05/2014  . Pleural effusion 05/2014  . Pneumonia 05/23/2014  . Presence of permanent cardiac pacemaker   . Prostate hypertrophy   . PVC's (premature ventricular contractions)    a. PVC's with prior Holter showing PVC load of 22%.  . Shortness of breath dyspnea   . Stroke ([place]) 01  . TIA (transient ischemic attack)    ASPVD, S./P. right CEA, 2002, and known occluded left carotid  . Ventricular tachycardia ([place])    a. Admitted with VT 05/2013 - EPS with inducible sustained monomorphic VT; s/p Medtronic ICD 05/21/14.   Past Surgical History:  Procedure Laterality Date  . BACK SURGERY    . CAROTID ENDARTERECTOMY Right 01  . carotidectomy  2003   right side  . COLONOSCOPY  07/2010  . CORONARY ARTERY BYPASS GRAFT  02/2009   3 vessel  . ELECTROPHYSIOLOGIC STUDY  05/21/14  . ELECTROPHYSIOLOGY STUDY N/A 05/21/2014   Procedure: ELECTROPHYSIOLOGY STUDY;  Surgeon: Deboraha Sprang, MD;  Location: [facility] CATH LAB;  Service: Cardiovascular;  Laterality: N/A;  . EP IMPLANTABLE DEVICE  05/21/14   single chamber Metronic ICD  . LEFT HEART CATHETERIZATION WITH CORONARY/GRAFT ANGIOGRAM N/A 05/19/2014   Procedure: LEFT HEART CATHETERIZATION WITH Beatrix Fetters;  Surgeon: Josue Hector, MD;  Location: [facility] CATH LAB;  Service: Cardiovascular;  Laterality:  N/A;  . PLEURAL BIOPSY N/A 11/27/2014   Procedure: PLEURAL BIOPSY;  Surgeon: [name], MD;  Location: [place];  Service: Thoracic;  Laterality: N/A;  . PLEURAL EFFUSION DRAINAGE Right 11/27/2014   Procedure: DRAINAGE OF PLEURAL EFFUSION;  Surgeon: [name], MD;  Location: [place];  Service: Thoracic;  Laterality: Right;  . TALC PLEURODESIS Right 11/27/2014   Procedure: Pietro Cassis;  Surgeon: [name], MD;  Location: [place];  Service: Thoracic;  Laterality: Right;  Marland Kitchen VIDEO ASSISTED THORACOSCOPY Right 11/27/2014   Procedure: RIGHT VIDEO ASSISTED THORACOSCOPY;  Surgeon: [name], MD;  Location: [place];  Service: Thoracic;  Laterality: Right;   Current Outpatient Medications on File Prior to Visit  Medication Sig Dispense Refill  . albuterol (VENTOLIN HFA) 108 (90  Base) MCG/ACT inhaler INHALE 2 PUFFS BY MOUTH EVERY 6 HOURS AS NEEDED FOR WHEEZING FOR SHORTNESS OF BREATH 18 g 0  . atorvastatin (LIPITOR) 80 MG tablet TAKE ONE TABLET BY MOUTH ONCE DAILY AT 6 PM. 90 tablet 3  . carvedilol (COREG) 12.5 MG tablet Take 1 tablet by mouth twice daily 180 tablet 2  . clopidogrel (PLAVIX) 75 MG tablet Take 1 tablet by mouth daily 90 tablet 3  . furosemide (LASIX) 20 MG tablet Take 1 tablet (20 mg total) by mouth every other day. 45 tablet 3  . nitroGLYCERIN (NITROSTAT) 0.4 MG SL tablet Place 1 tablet (0.4 mg total) under the tongue every 5 (five) minutes as needed for chest pain. 25 tablet 4  . sildenafil (VIAGRA) 100 MG tablet Take 0.5-1 tablets (50-100 mg total) by mouth daily as needed for erectile dysfunction. 5 tablet 11  . TASIGNA 150 MG capsule TAKE 2 CAPSULES (300 MG TOTAL) BY MOUTH EVERY 12 (TWELVE) HOURS. TAKE ON AN EMPTY STOMACH, 1 HR BEFORE OR 2 HRS AFTER FOOD. 112 capsule 10  . valsartan (DIOVAN) 160 MG tablet Take 1 tablet by mouth once daily 90 tablet 3   No current facility-administered medications on file prior to visit.   Allergies  Allergen  Reactions  . Norvasc [Amlodipine Besylate] Rash    rash  . Sulfa Antibiotics Hives    Hives    Social History   Socioeconomic History  . Marital status: Married    Spouse name: Not on file  . Number of children: 2  . Years of education: Not on file  . Highest education level: Not on file  Occupational History    Comment: retired Development worker, international [employer]  Tobacco Use  . Smoking status: Former Smoker    Packs/day: 3.00    Years: 45.00    Pack years: 135.00    Types: Cigarettes    Quit date: 08/08/1997    Years since quitting: 22.6  . Smokeless tobacco: Former Network engineer  . Vaping Use: Never used  Substance and Sexual Activity  . Alcohol use: No    Alcohol/week: 0.0 standard drinks  . Drug use: No  . Sexual activity: Yes  Other Topics Concern  . Not on file  Social History Narrative  . Not on file   Social Determinants of Health   Financial Resource Strain:   . Difficulty of Paying Living Expenses: Not on file  Food Insecurity:   . Worried About Charity fundraiser in the Last Year: Not on file  . Ran Out of Food in the Last Year: Not on file  Transportation Needs:   . Lack of Transportation (Medical): Not on file  . Lack of Transportation (Non-Medical): Not on file  Physical Activity:   . Days of Exercise per Week: Not on file  . Minutes of Exercise per Session: Not on file  Stress:   . Feeling of Stress : Not on file  Social Connections:   . Frequency of Communication with Friends and Family: Not on file  . Frequency of Social Gatherings with Friends and Family: Not on file  . Attends Religious Services: Not on file  . Active Member of Clubs or Organizations: Not on file  . Attends Archivist Meetings: Not on file  . Marital Status: Not on file  Intimate Partner Violence:   . Fear of Current or Ex-Partner: Not on file  . Emotionally Abused: Not on file  . Physically Abused: Not on file  .  Sexually Abused: Not on file      Review of Systems    Gastrointestinal: Positive for diarrhea.  All other systems reviewed and are negative.      Objective:   Physical Exam Vitals reviewed.  Constitutional:      Appearance: He is well-developed.  Cardiovascular:     Rate and Rhythm: Normal rate and regular rhythm.     Heart sounds: Normal heart sounds. No murmur heard.   Pulmonary:     Effort: Pulmonary effort is normal. No respiratory distress.     Breath sounds: Normal breath sounds. No wheezing or rales.  Abdominal:     General: Bowel sounds are normal. There is no distension.     Palpations: Abdomen is soft. There is no mass.     Tenderness: There is no abdominal tenderness. There is no guarding or rebound.           Assessment & Plan:  Congestive heart failure with cardiomyopathy ([place]) - Plan: CBC with Differential/Platelet, COMPLETE METABOLIC PANEL WITH GFR, Lipid panel  History of CVA (cerebrovascular accident) - Plan: CBC with Differential/Platelet, COMPLETE METABOLIC PANEL WITH GFR, Lipid panel  Other emphysema (HCC)  History of ASCVD - Plan: CBC with Differential/Platelet, COMPLETE METABOLIC PANEL WITH GFR, Lipid panel  Carotid stenosis, asymptomatic, right - Plan: CBC with Differential/Platelet, COMPLETE METABOLIC PANEL WITH GFR, Lipid panel  Cardiomyopathy, ischemic  Prostate cancer screening - Plan: PSA  CML (chronic myelocytic leukemia) ([place])  I will screen the patient for prostate cancer with a PSA. We discussed a colonoscopy but he declines at the present time. His CML is currently being managed by his oncologist. I will check fasting lipid panel and a PSA. His goal LDL cholesterol is less than 70. I spent the majority of our visit trying to convince the patient to get the Covid vaccine. He declines at the present time. We also discussed the shingles vaccine. He denies any falls, depression, or memory loss. Blood pressure today is adequately controlled at 140/70. Patient also refuses a flu shot. Patient is  on mexiletine twice daily. This is prescribed by his cardiologist. He states that he has been unable to get a refill. His last cardiology appointment was in April. I will provide the patient with a temporary refill until he can touch base with his cardiologist given his history of PVCs and ventricular tachycardia

## 2020-03-27 ENCOUNTER — Encounter: Payer: Self-pay | Admitting: *Deleted

## 2020-04-03 ENCOUNTER — Ambulatory Visit
Admission: RE | Admit: 2020-04-03 | Discharge: 2020-04-03 | Disposition: A | Discharge status: Home / Self Care | Payer: Medicare HMO | Source: Ambulatory Visit | Attending: Family Medicine | Admitting: Family Medicine

## 2020-04-03 DIAGNOSIS — I6521 Occlusion and stenosis of right carotid artery: Secondary | ICD-10-CM

## 2020-04-03 DIAGNOSIS — I6523 Occlusion and stenosis of bilateral carotid arteries: Secondary | ICD-10-CM | POA: Diagnosis not present

## 2020-04-03 DIAGNOSIS — I7789 Other specified disorders of arteries and arterioles: Secondary | ICD-10-CM | POA: Diagnosis not present

## 2020-04-03 DIAGNOSIS — I720 Aneurysm of carotid artery: Secondary | ICD-10-CM | POA: Diagnosis not present

## 2020-04-07 DIAGNOSIS — H15113 Episcleritis periodica fugax, bilateral: Secondary | ICD-10-CM | POA: Diagnosis not present

## 2020-04-07 DIAGNOSIS — H1013 Acute atopic conjunctivitis, bilateral: Secondary | ICD-10-CM | POA: Diagnosis not present

## 2020-04-21 ENCOUNTER — Other Ambulatory Visit: Payer: Self-pay

## 2020-04-21 ENCOUNTER — Encounter: Payer: Self-pay | Admitting: Family Medicine

## 2020-04-21 ENCOUNTER — Ambulatory Visit (INDEPENDENT_AMBULATORY_CARE_PROVIDER_SITE_OTHER): Payer: Medicare HMO | Admitting: Family Medicine

## 2020-04-21 VITALS — BP 160/90 | HR 90 | Resp 16 | Ht 69.0 in | Wt 175.0 lb

## 2020-04-21 DIAGNOSIS — H8110 Benign paroxysmal vertigo, unspecified ear: Secondary | ICD-10-CM | POA: Diagnosis not present

## 2020-04-21 DIAGNOSIS — M79642 Pain in left hand: Secondary | ICD-10-CM

## 2020-04-21 MED ORDER — MECLIZINE HCL 25 MG PO TABS: 25.0000 mg | ORAL_TABLET | Freq: Three times a day (TID) | ORAL | 0 refills | Status: DC | PRN

## 2020-04-21 NOTE — Progress Notes (Signed)
Subjective:    Patient ID: Anthony Wolfe, male    DOB: 1945/08/31, 74 y.o.   MRN: [id number]  Patient presents today with 2 concerns.  First he request a referral to a hand specialist due to the pain in his hands.  He has burning pain in his left and his right hand.  The pain is primarily in the palm.  He also has stiffness in his hands and difficulty flexing and extending his MCP joints primarily in his left hand and primarily the third and fourth MCP joints.  There is palpable abnormalities in the palm consistent with Dupuytren's contracture.  Second concern is severe nausea and dizziness.  This occurred this morning.  He sat up in bed and the room began to spin.  He felt extremely nauseated like he could throw up.  His wife gave use of meclizine and the nausea and spinning subsided.  Movement triggers the vertigo.  He denies any tinnitus or hearing loss.  He denies any head trauma.  There is no neurologic deficit on exam  Past Medical History:  Diagnosis Date  . AICD (automatic cardioverter/defibrillator) present   . Allergic rhinitis   . 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 Anxiety   . Blood dyscrasia    cmleukemia  . C. difficile colitis 06/16/2014  . CAD (coronary artery disease)    a. s/p 3v CABG 2010. b. NSTEMI 05/2014 in setting of VT. Cath impressions: "Recent IMI with occluded SVG to RCA. Has Right to Right collaterals and collaterals from septal and OM. EF 40%."  . Carotid disease, bilateral ([place])    a. Right CEA 2002; known occluded left carotid. b. Duplex 06/2014: known occluded LICA, stable 7-34% RICA s/p CEA.  . Chronic systolic CHF (congestive heart failure) ([place])   . 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 Depression   . Emphysema of lung ([place])   . HTN (hypertension)   . Hyperlipidemia   . Ischemic cardiomyopathy    a. Prior EF 36%. b. 2014: 50%. c. 05/2014: EF 35-40% by echo, 40% with inf HK by cath.  . NSTEMI (non-ST elevation myocardial infarction)  ([place]) 05/2014  . Pleural effusion 05/2014  . Pneumonia 05/23/2014  . Presence of permanent cardiac pacemaker   . Prostate hypertrophy   . PVC's (premature ventricular contractions)    a. PVC's with prior Holter showing PVC load of 22%.  . Shortness of breath dyspnea   . Stroke ([place]) 01  . TIA (transient ischemic attack)    ASPVD, S./P. right CEA, 2002, and known occluded left carotid  . Ventricular tachycardia ([place])    a. Admitted with VT 05/2013 - EPS with inducible sustained monomorphic VT; s/p Medtronic ICD 05/21/14.   Past Surgical History:  Procedure Laterality Date  . BACK SURGERY    . CAROTID ENDARTERECTOMY Right 01  . carotidectomy  2003   right side  . COLONOSCOPY  07/2010  . CORONARY ARTERY BYPASS GRAFT  02/2009   3 vessel  . ELECTROPHYSIOLOGIC STUDY  05/21/14  . ELECTROPHYSIOLOGY STUDY N/A 05/21/2014   Procedure: ELECTROPHYSIOLOGY STUDY;  Surgeon: Deboraha Sprang, MD;  Location: [facility] CATH LAB;  Service: Cardiovascular;  Laterality: N/A;  . EP IMPLANTABLE DEVICE  05/21/14   single chamber Metronic ICD  . LEFT HEART CATHETERIZATION WITH CORONARY/GRAFT ANGIOGRAM N/A 05/19/2014   Procedure: LEFT HEART CATHETERIZATION WITH Beatrix Fetters;  Surgeon: Josue Hector, MD;  Location[facility] CATH LAB;  Service: Cardiovascular;  Laterality: N/A;  . PLEURAL  BIOPSY N/A 11/27/2014   Procedure: PLEURAL BIOPSY;  Surgeon: [name], MD;  Location: [place];  Service: Thoracic;  Laterality: N/A;  . PLEURAL EFFUSION DRAINAGE Right 11/27/2014   Procedure: DRAINAGE OF PLEURAL EFFUSION;  Surgeon: [name], MD;  Location: [place];  Service: Thoracic;  Laterality: Right;  . TALC PLEURODESIS Right 11/27/2014   Procedure: Pietro Cassis;  Surgeon: [name], MD;  Location: [place];  Service: Thoracic;  Laterality: Right;  Marland Kitchen VIDEO ASSISTED THORACOSCOPY Right 11/27/2014   Procedure: RIGHT VIDEO ASSISTED THORACOSCOPY;  Surgeon: [name], MD;  Location: [place];   Service: Thoracic;  Laterality: Right;   Current Outpatient Medications on File Prior to Visit  Medication Sig Dispense Refill  . albuterol (VENTOLIN HFA) 108 (90 Base) MCG/ACT inhaler INHALE 2 PUFFS BY MOUTH EVERY 6 HOURS AS NEEDED FOR WHEEZING FOR SHORTNESS OF BREATH 18 g 0  . atorvastatin (LIPITOR) 80 MG tablet TAKE ONE TABLET BY MOUTH ONCE DAILY AT 6 PM. 90 tablet 3  . carvedilol (COREG) 12.5 MG tablet Take 1 tablet by mouth twice daily 180 tablet 2  . clopidogrel (PLAVIX) 75 MG tablet Take 1 tablet by mouth daily 90 tablet 3  . furosemide (LASIX) 20 MG tablet Take 1 tablet (20 mg total) by mouth every other day. 45 tablet 3  . mexiletine (MEXITIL) 200 MG capsule Take 1 capsule (200 mg total) by mouth 2 (two) times daily. 60 capsule 5  . nitroGLYCERIN (NITROSTAT) 0.4 MG SL tablet Place 1 tablet (0.4 mg total) under the tongue every 5 (five) minutes as needed for chest pain. 25 tablet 4  . sildenafil (VIAGRA) 100 MG tablet Take 0.5-1 tablets (50-100 mg total) by mouth daily as needed for erectile dysfunction. 5 tablet 11  . TASIGNA 150 MG capsule TAKE 2 CAPSULES (300 MG TOTAL) BY MOUTH EVERY 12 (TWELVE) HOURS. TAKE ON AN EMPTY STOMACH, 1 HR BEFORE OR 2 HRS AFTER FOOD. 112 capsule 10  . valsartan (DIOVAN) 160 MG tablet Take 1 tablet by mouth once daily 90 tablet 3   No current facility-administered medications on file prior to visit.   Allergies  Allergen Reactions  . Norvasc [Amlodipine Besylate] Rash    rash  . Sulfa Antibiotics Hives    Hives    Social History   Socioeconomic History  . Marital status: Married    Spouse name: Not on file  . Number of children: 2  . Years of education: Not on file  . Highest education level: Not on file  Occupational History    Comment: retired Development worker, international [employer]  Tobacco Use  . Smoking status: Former Smoker    Packs/day: 3.00    Years: 45.00    Pack years: 135.00    Types: Cigarettes    Quit date: 08/08/1997    Years since quitting: 22.7  .  Smokeless tobacco: Former Network engineer  . Vaping Use: Never used  Substance and Sexual Activity  . Alcohol use: No    Alcohol/week: 0.0 standard drinks  . Drug use: No  . Sexual activity: Yes  Other Topics Concern  . Not on file  Social History Narrative  . Not on file   Social Determinants of Health   Financial Resource Strain:   . Difficulty of Paying Living Expenses: Not on file  Food Insecurity:   . Worried About Charity fundraiser in the Last Year: Not on file  . Ran Out of Food in the Last Year:  Not on file  Transportation Needs:   . Lack of Transportation (Medical): Not on file  . Lack of Transportation (Non-Medical): Not on file  Physical Activity:   . Days of Exercise per Week: Not on file  . Minutes of Exercise per Session: Not on file  Stress:   . Feeling of Stress : Not on file  Social Connections:   . Frequency of Communication with Friends and Family: Not on file  . Frequency of Social Gatherings with Friends and Family: Not on file  . Attends Religious Services: Not on file  . Active Member of Clubs or Organizations: Not on file  . Attends Archivist Meetings: Not on file  . Marital Status: Not on file  Intimate Partner Violence:   . Fear of Current or Ex-Partner: Not on file  . Emotionally Abused: Not on file  . Physically Abused: Not on file  . Sexually Abused: Not on file      Review of Systems  Gastrointestinal: Positive for diarrhea.  All other systems reviewed and are negative.      Objective:   Physical Exam Vitals reviewed.  Constitutional:      Appearance: He is well-developed.  Cardiovascular:     Rate and Rhythm: Normal rate and regular rhythm.     Heart sounds: Normal heart sounds. No murmur heard.   Pulmonary:     Effort: Pulmonary effort is normal. No respiratory distress.     Breath sounds: Normal breath sounds. No wheezing or rales.  Abdominal:     General: Bowel sounds are normal. There is no distension.      Palpations: Abdomen is soft. There is no mass.     Tenderness: There is no abdominal tenderness. There is no guarding or rebound.      Dix-Hallpike maneuver is negative bilaterally     Assessment & Plan:  Benign paroxysmal positional vertigo, unspecified laterality  Hand pain, left - Plan: Ambulatory referral to Hand Surgery  Patient's history and physical exam suggest benign paroxysmal positional vertigo.  Symptoms are better.  He can use meclizine every 8 hours as needed otherwise anticipate gradual improvement over the next 2 weeks.  I believe the pain in his hand is likely due to Dupuytren's contracture.  He would like to see his hand specialist again to discuss surgical options and I will arrange follow-up with him.

## 2020-04-24 ENCOUNTER — Inpatient Hospital Stay: Payer: Medicare HMO | Attending: Hematology and Oncology

## 2020-04-24 ENCOUNTER — Other Ambulatory Visit: Payer: Self-pay

## 2020-04-24 DIAGNOSIS — I255 Ischemic cardiomyopathy: Secondary | ICD-10-CM | POA: Insufficient documentation

## 2020-04-24 DIAGNOSIS — Z79899 Other long term (current) drug therapy: Secondary | ICD-10-CM | POA: Insufficient documentation

## 2020-04-24 DIAGNOSIS — C9210 Chronic myeloid leukemia, BCR/ABL-positive, not having achieved remission: Secondary | ICD-10-CM | POA: Diagnosis not present

## 2020-04-24 DIAGNOSIS — I10 Essential (primary) hypertension: Secondary | ICD-10-CM | POA: Diagnosis not present

## 2020-04-24 DIAGNOSIS — D6481 Anemia due to antineoplastic chemotherapy: Secondary | ICD-10-CM | POA: Insufficient documentation

## 2020-04-24 LAB — CBC WITH DIFFERENTIAL/PLATELET
Abs Immature Granulocytes: 0.03 10*3/uL (ref 0.00–0.07)
Basophils Absolute: 0.1 10*3/uL (ref 0.0–0.1)
Basophils Relative: 1 %
Eosinophils Absolute: 0.2 10*3/uL (ref 0.0–0.5)
Eosinophils Relative: 2 %
HCT: 35.7 % — ABNORMAL LOW (ref 39.0–52.0)
Hemoglobin: 11.5 g/dL — ABNORMAL LOW (ref 13.0–17.0)
Immature Granulocytes: 0 %
Lymphocytes Relative: 13 %
Lymphs Abs: 1 10*3/uL (ref 0.7–4.0)
MCH: 30.1 pg (ref 26.0–34.0)
MCHC: 32.2 g/dL (ref 30.0–36.0)
MCV: 93.5 fL (ref 80.0–100.0)
Monocytes Absolute: 0.7 10*3/uL (ref 0.1–1.0)
Monocytes Relative: 9 %
Neutro Abs: 5.3 10*3/uL (ref 1.7–7.7)
Neutrophils Relative %: 75 %
Platelets: 204 10*3/uL (ref 150–400)
RBC: 3.82 MIL/uL — ABNORMAL LOW (ref 4.22–5.81)
RDW: 15.1 % (ref 11.5–15.5)
WBC: 7.2 10*3/uL (ref 4.0–10.5)
nRBC: 0 % (ref 0.0–0.2)

## 2020-04-24 LAB — COMPREHENSIVE METABOLIC PANEL
ALT: 10 U/L (ref 0–44)
AST: 11 U/L — ABNORMAL LOW (ref 15–41)
Albumin: 3.4 g/dL — ABNORMAL LOW (ref 3.5–5.0)
Alkaline Phosphatase: 103 U/L (ref 38–126)
Anion gap: 10 (ref 5–15)
BUN: 11 mg/dL (ref 8–23)
CO2: 26 mmol/L (ref 22–32)
Calcium: 8.8 mg/dL — ABNORMAL LOW (ref 8.9–10.3)
Chloride: 107 mmol/L (ref 98–111)
Creatinine, Ser: 0.9 mg/dL (ref 0.61–1.24)
GFR calc Af Amer: >60 mL/min (ref >60)
GFR calc non Af Amer: >60 mL/min (ref >60)
Glucose, Bld: 104 mg/dL — ABNORMAL HIGH (ref 70–99)
Potassium: 3.7 mmol/L (ref 3.5–5.1)
Sodium: 143 mmol/L (ref 135–145)
Total Bilirubin: 0.9 mg/dL (ref 0.3–1.2)
Total Protein: 6.9 g/dL (ref 6.5–8.1)

## 2020-04-30 DIAGNOSIS — M25521 Pain in right elbow: Secondary | ICD-10-CM | POA: Diagnosis not present

## 2020-04-30 DIAGNOSIS — M79641 Pain in right hand: Secondary | ICD-10-CM | POA: Diagnosis not present

## 2020-04-30 DIAGNOSIS — R200 Anesthesia of skin: Secondary | ICD-10-CM | POA: Diagnosis not present

## 2020-04-30 DIAGNOSIS — R202 Paresthesia of skin: Secondary | ICD-10-CM | POA: Diagnosis not present

## 2020-04-30 LAB — BCR/ABL

## 2020-05-05 ENCOUNTER — Other Ambulatory Visit: Payer: Self-pay

## 2020-05-05 ENCOUNTER — Inpatient Hospital Stay: Payer: Medicare HMO | Admitting: Hematology and Oncology

## 2020-05-05 ENCOUNTER — Encounter: Payer: Self-pay | Admitting: Hematology and Oncology

## 2020-05-05 DIAGNOSIS — C9210 Chronic myeloid leukemia, BCR/ABL-positive, not having achieved remission: Secondary | ICD-10-CM | POA: Diagnosis not present

## 2020-05-05 DIAGNOSIS — I255 Ischemic cardiomyopathy: Secondary | ICD-10-CM

## 2020-05-05 DIAGNOSIS — D6481 Anemia due to antineoplastic chemotherapy: Secondary | ICD-10-CM

## 2020-05-05 DIAGNOSIS — I10 Essential (primary) hypertension: Secondary | ICD-10-CM

## 2020-05-05 DIAGNOSIS — T451X5A Adverse effect of antineoplastic and immunosuppressive drugs, initial encounter: Secondary | ICD-10-CM

## 2020-05-05 DIAGNOSIS — Z79899 Other long term (current) drug therapy: Secondary | ICD-10-CM | POA: Diagnosis not present

## 2020-05-05 NOTE — Progress Notes (Signed)
[place] OFFICE PROGRESS NOTE  Patient Care Team: [name], MD as PCP - General (Family Medicine) [name]., MD as Attending Physician (Cardiology) [name], MD (Family Medicine) [name], MD (Cardiothoracic Surgery) Early, Arvilla Meres, MD as Attending Physician (Vascular Surgery) [name], MD as Consulting Physician (Hematology and Oncology) Deboraha Sprang, MD as Consulting Physician (Cardiology) [name], [facility] as Pharmacist (Pharmacist)  ASSESSMENT & PLAN:  Chronic myelogenous leukemia Thankfully, he is still in remission Since our last discussion, he is compliant taking his medication as prescribed I plan to see him back in 3 months for further follow-up   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 The patient denies recent history of bleeding such as epistaxis, hematuria or hematochezia. He is asymptomatic from the anemia. We will observe for now.  HTN (hypertension) He has poorly controlled hypertension despite being on multiple different medications I recommend he consult with his primary care doctor or cardiologist for further management Today, he is not symptomatic  Cardiomyopathy, ischemic He continues on aggressive medical management He has no recent exacerbation of congestive heart failure Continue medical management   No orders of the defined types were placed in this encounter.   All questions were answered. The patient knows to call the clinic with any problems, questions or concerns. The total time spent in the appointment was 20 minutes encounter with patients including review of chart and various tests results, discussions about plan of care and coordination of care plan   [name], MD 05/05/2020 3:34 PM  INTERVAL HISTORY: Please see below for problem oriented charting. He returns for further follow-up He is compliant taking all his medication as  prescribed He has no problems getting his medication refill He is noticed to have high blood pressure today According to the patient, the blood pressure control at home has not been within normal range He denies recent cough, chest pain or shortness of breath No recent infection, fever or chills  SUMMARY OF ONCOLOGIC HISTORY: Oncology History  Chronic myelogenous leukemia ([place])  05/04/2012 Bone Marrow Biopsy   BM confirmed diagnosis of CML in Chronic phase. BCR/ABL by PCR detected abnormalitis with b2a2 & b3a2 subtypes   05/09/2012 - 03/16/2015 Chemotherapy   He was started on treatment with Dasatinib 100 mg daily   06/06/2012 Adverse Reaction   Dose of medication was reduced to 50 mg daily due to pancytopenia   01/24/2013 Progression   Patient was noted to have elevated blood count which and was subsequently found to be noncompliant to treatment. The patient has not been on treatment for several months because his prescription ran out. He was restarted back on treatment   01/16/2014 Progression   Bcr/ABL by PCR is worse. Dose of Dasatinib was increased to 100 mg.   02/26/2014 Tumor Marker   Blood work for ABL kinase mutation was negative.   10/29/2014 Tumor Marker   BCR/ABL b2a2 & b3a2 0.29%, IS 0.1624%, not in MMR yet but improving   11/05/2014 Adverse Reaction   He had thoracentesis due to pleural effusion   11/27/2014 Surgery   He underwent right video-assisted thoracoscopy, Drainage of pleural effusion, Pleural biopsy, Diaphragm biopsy, Lung biopsy & [place] pleurodesis   01/30/2015 Tumor Marker   BCR/ABL b2a2 & b3a2 0.78%, IS 0.4368%, not in MMR    03/11/2015 Pathology Results   BCR/ABL b2a2 0.19%, IS 0.1064%, not in MMR    03/16/2015 Adverse Reaction  Dasatinib was stopped due to recurrent pleural effusion   03/28/2015 - 11/06/2017 Chemotherapy   He started on Bosutinib   03/30/2015 Procedure   He has therapeutic thoracentesis of the right lung with 1 liter of fluid removed    03/30/2015 Adverse Reaction   Bosutinib is placed on hold, to be restart on 8/25 at 250 mg due to severe diarrhea   04/28/2015 Tumor Marker   BCR/ABL b2a2 0.06%, IS 0.0336%, in MMR    06/10/2015 Tumor Marker   BCR/ABL b2a2 0.14%, IS 0.1162%, not in MMR    08/14/2015 Pathology Results   BCR/ABL b2a2 0.02%, IS 0.0166%, In MMR    11/09/2015 Tumor Marker   BCR/ABL b2a2 0.005%, IS 0.0042%, In MMR    02/08/2016 Tumor Marker   BCR/ABL undetectable. In MMR   06/13/2016 Tumor Marker   BCR/ABL undetectable. In MMR   12/06/2016 Pathology Results   BCR/ABL undetectable. In MMR   06/08/2017 Pathology Results   BCR/ABL undetectable. In MMR   09/08/2017 Pathology Results   BCR/ABL undetectable. In MMR   12/11/2017 Pathology Results   BCR/ABL undetectable. In MMR   03/19/2018 Pathology Results   BCR/ABL undetectable. In MMR   06/12/2018 Pathology Results   BCR/ABL undetectable. In MMR   09/06/2018 Pathology Results   BCR ABL is detectable. e13a2 (b2a2) and [id number]) IS: 0.009%   09/26/2018 -  Chemotherapy   The patient had Tasigna    11/21/2018 Pathology Results   BCR/ABL undetectable. In MMR   03/07/2019 Pathology Results   BCR/ABL undetectable. In MMR   06/17/2019 Pathology Results   BCR/ABL undetectable. In MMR   10/14/2019 Pathology Results   BCR ABL is detectable. e13a2 (b2a2) and T01S0(F0X3) IS: 0.0281%. In MMR   01/14/2020 Pathology Results   BCR ABL is detectable. e13a2 (b2a2) and A35T7(D2K0) IS: 0.023%. In MMR     REVIEW OF SYSTEMS:   Constitutional: Denies fevers, chills or abnormal weight loss Eyes: Denies blurriness of vision Ears, nose, mouth, throat, and face: Denies mucositis or sore throat Respiratory: Denies cough, dyspnea or wheezes Cardiovascular: Denies palpitation, chest discomfort or lower extremity swelling Gastrointestinal:  Denies nausea, heartburn or change in bowel habits Skin: Denies abnormal skin rashes Lymphatics: Denies new lymphadenopathy or easy  bruising Neurological:Denies numbness, tingling or new weaknesses Behavioral/Psych: Mood is stable, no new changes  All other systems were reviewed with the patient and are negative.  I have reviewed the past medical history, past surgical history, social history and family history with the patient and they are unchanged from previous note.  ALLERGIES:  is allergic to norvasc [amlodipine besylate] and sulfa antibiotics.  MEDICATIONS:  Current Outpatient Medications  Medication Sig Dispense Refill  . albuterol (VENTOLIN HFA) 108 (90 Base) MCG/ACT inhaler INHALE 2 PUFFS BY MOUTH EVERY 6 HOURS AS NEEDED FOR WHEEZING FOR SHORTNESS OF BREATH 18 g 0  . atorvastatin (LIPITOR) 80 MG tablet TAKE ONE TABLET BY MOUTH ONCE DAILY AT 6 PM. 90 tablet 3  . carvedilol (COREG) 12.5 MG tablet Take 1 tablet by mouth twice daily 180 tablet 2  . clopidogrel (PLAVIX) 75 MG tablet Take 1 tablet by mouth daily 90 tablet 3  . furosemide (LASIX) 20 MG tablet Take 1 tablet (20 mg total) by mouth every other day. 45 tablet 3  . meclizine (ANTIVERT) 25 MG tablet Take 1 tablet (25 mg total) by mouth 3 (three) times daily as needed for dizziness. 30 tablet 0  . mexiletine (MEXITIL) 200 MG capsule Take 1 capsule (200  mg total) by mouth 2 (two) times daily. 60 capsule 5  . nitroGLYCERIN (NITROSTAT) 0.4 MG SL tablet Place 1 tablet (0.4 mg total) under the tongue every 5 (five) minutes as needed for chest pain. 25 tablet 4  . sildenafil (VIAGRA) 100 MG tablet Take 0.5-1 tablets (50-100 mg total) by mouth daily as needed for erectile dysfunction. 5 tablet 11  . TASIGNA 150 MG capsule TAKE 2 CAPSULES (300 MG TOTAL) BY MOUTH EVERY 12 (TWELVE) HOURS. TAKE ON AN EMPTY STOMACH, 1 HR BEFORE OR 2 HRS AFTER FOOD. 112 capsule 10  . valsartan (DIOVAN) 160 MG tablet Take 1 tablet by mouth once daily 90 tablet 3   No current facility-administered medications for this visit.    PHYSICAL EXAMINATION: ECOG PERFORMANCE STATUS: 1 -  Symptomatic but completely ambulatory  Vitals:   05/05/20 0923  BP: (!) 173/62  Pulse: 69  Resp: 18  Temp: (!) 97.4 F (36.3 C)  SpO2: 100%   Filed Weights   05/05/20 0923  Weight: 177 lb 9.6 oz (80.6 kg)    GENERAL:alert, no distress and comfortable SKIN: skin color, texture, turgor are normal, no rashes or significant lesions EYES: normal, Conjunctiva are pink and non-injected, sclera clear OROPHARYNX:no exudate, no erythema and lips, buccal mucosa, and tongue normal  NECK: supple, thyroid normal size, non-tender, without nodularity LYMPH:  no palpable lymphadenopathy in the cervical, axillary or inguinal LUNGS: clear to auscultation and percussion with normal breathing effort HEART: regular rate & rhythm and no murmurs and no lower extremity edema ABDOMEN:abdomen soft, non-tender and normal bowel sounds Musculoskeletal:no cyanosis of digits and no clubbing  NEURO: alert & oriented x 3 with fluent speech, no focal motor/sensory deficits  LABORATORY DATA:  I have reviewed the data as listed    Component Value Date/Time   NA 143 04/24/2020 0842   NA 142 06/08/2017 0832   K 3.7 04/24/2020 0842   K 3.9 06/08/2017 0832   CL 107 04/24/2020 0842   CL 110 (H) 11/14/2012 0918   CO2 26 04/24/2020 0842   CO2 25 06/08/2017 0832   GLUCOSE 104 (H) 04/24/2020 0842   GLUCOSE 98 06/08/2017 0832   GLUCOSE 108 (H) 11/14/2012 0918   BUN 11 04/24/2020 0842   BUN 11.6 06/08/2017 0832   CREATININE 0.90 04/24/2020 0842   CREATININE 0.90 05/31/2019 0837   CREATININE 0.9 06/08/2017 0832   CALCIUM 8.8 (L) 04/24/2020 0842   CALCIUM 8.6 06/08/2017 0832   PROT 6.9 04/24/2020 0842   PROT 6.7 06/08/2017 0832   ALBUMIN 3.4 (L) 04/24/2020 0842   ALBUMIN 3.4 (L) 06/08/2017 0832   AST 11 (L) 04/24/2020 0842   AST 13 06/08/2017 0832   ALT 10 04/24/2020 0842   ALT 7 06/08/2017 0832   ALKPHOS 103 04/24/2020 0842   ALKPHOS 96 06/08/2017 0832   BILITOT 0.9 04/24/2020 0842   BILITOT 0.44  06/08/2017 0832   GFRNONAA >60 04/24/2020 0842   GFRNONAA 83 05/31/2019 0837   GFRAA >60 04/24/2020 0842   GFRAA 96 05/31/2019 0837    No results found for: SPEP, UPEP  Lab Results  Component Value Date   WBC 7.2 04/24/2020   NEUTROABS 5.3 04/24/2020   HGB 11.5 (L) 04/24/2020   HCT 35.7 (L) 04/24/2020   MCV 93.5 04/24/2020   PLT 204 04/24/2020      Chemistry      Component Value Date/Time   NA 143 04/24/2020 0842   NA 142 06/08/2017 0832   K 3.7 04/24/2020  0842   K 3.9 06/08/2017 0832   CL 107 04/24/2020 0842   CL 110 (H) 11/14/2012 0918   CO2 26 04/24/2020 0842   CO2 25 06/08/2017 0832   BUN 11 04/24/2020 0842   BUN 11.6 06/08/2017 0832   CREATININE 0.90 04/24/2020 0842   CREATININE 0.90 05/31/2019 0837   CREATININE 0.9 06/08/2017 0832      Component Value Date/Time   CALCIUM 8.8 (L) 04/24/2020 0842   CALCIUM 8.6 06/08/2017 0832   ALKPHOS 103 04/24/2020 0842   ALKPHOS 96 06/08/2017 0832   AST 11 (L) 04/24/2020 0842   AST 13 06/08/2017 0832   ALT 10 04/24/2020 0842   ALT 7 06/08/2017 0832   BILITOT 0.9 04/24/2020 0842   BILITOT 0.44 06/08/2017 5726

## 2020-05-05 NOTE — Assessment & Plan Note (Signed)
Thankfully, he is still in remission Since our last discussion, he is compliant taking his medication as prescribed I plan to see him back in 3 months for further follow-up

## 2020-05-05 NOTE — Assessment & Plan Note (Signed)
He has poorly controlled hypertension despite being on multiple different medications I recommend he consult with his primary care doctor or cardiologist for further management Today, he is not symptomatic

## 2020-05-05 NOTE — Assessment & Plan Note (Signed)
He continues on aggressive medical management He has no recent exacerbation of congestive heart failure Continue medical management

## 2020-05-05 NOTE — Assessment & Plan Note (Signed)
This is likely anemia of chronic disease. The patient denies recent history of bleeding such as epistaxis, hematuria or hematochezia. He is asymptomatic from the anemia. We will observe for now.  

## 2020-05-18 ENCOUNTER — Ambulatory Visit
Admission: RE | Admit: 2020-05-18 | Discharge: 2020-05-18 | Disposition: A | Discharge status: Home / Self Care | Payer: Medicare HMO | Source: Ambulatory Visit | Attending: Family Medicine | Admitting: Family Medicine

## 2020-05-18 ENCOUNTER — Other Ambulatory Visit: Payer: Self-pay

## 2020-05-18 ENCOUNTER — Ambulatory Visit (INDEPENDENT_AMBULATORY_CARE_PROVIDER_SITE_OTHER): Payer: Medicare HMO | Admitting: Family Medicine

## 2020-05-18 VITALS — BP 140/80 | HR 74 | Temp 97.7°F | Ht 69.0 in | Wt 197.0 lb

## 2020-05-18 DIAGNOSIS — I255 Ischemic cardiomyopathy: Secondary | ICD-10-CM

## 2020-05-18 DIAGNOSIS — R0609 Other forms of dyspnea: Secondary | ICD-10-CM

## 2020-05-18 DIAGNOSIS — Z8679 Personal history of other diseases of the circulatory system: Secondary | ICD-10-CM

## 2020-05-18 DIAGNOSIS — R0600 Dyspnea, unspecified: Secondary | ICD-10-CM | POA: Diagnosis not present

## 2020-05-18 DIAGNOSIS — Z95810 Presence of automatic (implantable) cardiac defibrillator: Secondary | ICD-10-CM | POA: Diagnosis not present

## 2020-05-18 DIAGNOSIS — R0602 Shortness of breath: Secondary | ICD-10-CM | POA: Diagnosis not present

## 2020-05-18 NOTE — Progress Notes (Signed)
Subjective:    Patient ID: Anthony Wolfe, male    DOB: 1945-10-02, 74 y.o.   MRN: [id number]   Patient is a very pleasant 73 year old Caucasian male here today for regular checkup.  He has a history of coronary artery disease status post three-vessel CABG in 2010.  He also had a non-ST elevation myocardial infarction in 2015.  He also has ischemic cardiomyopathy with an ejection fraction of 40%.  He was unable to tolerate Entresto due to hypotension.  However he is on carvedilol as well as valsartan.  He states that 1 week ago, he had some atypical left-sided chest pain that was near his left axilla.  It occurred while sitting in a chair.  It resolved and he has not had any pain since.  He denies any chest pain with activity or chest pressure with activity however he does report increasing dyspnea on exertion.  He states that he becomes extremely winded with minimal activity although he denies any chest pain.  He denies any cough or fever or pleurisy or hemoptysis.  He has been trying albuterol with minimal relief.  Lungs are completely clear to auscultation today.  There are no wheezes crackles or rails.  There is no pitting edema in his extremities.  Pulse oximetry is 98% on room air.  Therefore I am concerned that his dyspnea on exertion is related to his underlying cardiomyopathy.  He has not had a stress test since 2018 and he has not seen his cardiologist in quite some time Past Medical History:  Diagnosis Date  . AICD (automatic cardioverter/defibrillator) present   . Allergic rhinitis   . 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 Anxiety   . Blood dyscrasia    cmleukemia  . C. difficile colitis 06/16/2014  . CAD (coronary artery disease)    a. s/p 3v CABG 2010. b. NSTEMI 05/2014 in setting of VT. Cath impressions: "Recent IMI with occluded SVG to RCA. Has Right to Right collaterals and collaterals from septal and OM. EF 40%."  . Carotid disease, bilateral ([place])    a. Right CEA 2002; known  occluded left carotid. b. Duplex 06/2014: known occluded LICA, stable 7-26% RICA s/p CEA.  . Chronic systolic CHF (congestive heart failure) ([place])   . CML (chronic myelocytic leukemia) ([place]) 05/09/2012  . Depression   . Emphysema of lung ([place])   . HTN (hypertension)   . Hyperlipidemia   . Ischemic cardiomyopathy    a. Prior EF 36%. b. 2014: 50%. c. 05/2014: EF 35-40% by echo, 40% with inf HK by cath.  . NSTEMI (non-ST elevation myocardial infarction) ([place]) 05/2014  . Pleural effusion 05/2014  . Pneumonia 05/23/2014  . Presence of permanent cardiac pacemaker   . Prostate hypertrophy   . PVC's (premature ventricular contractions)    a. PVC's with prior Holter showing PVC load of 22%.  . Shortness of breath dyspnea   . Stroke ([place]) 01  . TIA (transient ischemic attack)    ASPVD, S./P. right CEA, 2002, and known occluded left carotid  . Ventricular tachycardia ([place])    a. Admitted with VT 05/2013 - EPS with inducible sustained monomorphic VT; s/p Medtronic ICD 05/21/14.   Past Surgical History:  Procedure Laterality Date  . BACK SURGERY    . CAROTID ENDARTERECTOMY Right 01  . carotidectomy  2003   right side  . COLONOSCOPY  07/2010  . CORONARY ARTERY BYPASS GRAFT  02/2009   3 vessel  . ELECTROPHYSIOLOGIC STUDY  05/21/14  .  ELECTROPHYSIOLOGY STUDY N/A 05/21/2014   Procedure: ELECTROPHYSIOLOGY STUDY;  Surgeon: Deboraha Sprang, MD;  Location[facility] CATH LAB;  Service: Cardiovascular;  Laterality: N/A;  . EP IMPLANTABLE DEVICE  05/21/14   single chamber Metronic ICD  . LEFT HEART CATHETERIZATION WITH CORONARY/GRAFT ANGIOGRAM N/A 05/19/2014   Procedure: LEFT HEART CATHETERIZATION WITH Beatrix Fetters;  Surgeon: Josue Hector, MD;  Location: [facility] CATH LAB;  Service: Cardiovascular;  Laterality: N/A;  . PLEURAL BIOPSY N/A 11/27/2014   Procedure: PLEURAL BIOPSY;  Surgeon: [name], MD;  Location: [place];  Service: Thoracic;  Laterality: N/A;  . PLEURAL EFFUSION DRAINAGE Right  11/27/2014   Procedure: DRAINAGE OF PLEURAL EFFUSION;  Surgeon: [name], MD;  Location: [place];  Service: Thoracic;  Laterality: Right;  . TALC PLEURODESIS Right 11/27/2014   Procedure: Pietro Cassis;  Surgeon: [name], MD;  Location: [place];  Service: Thoracic;  Laterality: Right;  Marland Kitchen VIDEO ASSISTED THORACOSCOPY Right 11/27/2014   Procedure: RIGHT VIDEO ASSISTED THORACOSCOPY;  Surgeon: [name], MD;  Location: [place];  Service: Thoracic;  Laterality: Right;   Current Outpatient Medications on File Prior to Visit  Medication Sig Dispense Refill  . albuterol (VENTOLIN HFA) 108 (90 Base) MCG/ACT inhaler INHALE 2 PUFFS BY MOUTH EVERY 6 HOURS AS NEEDED FOR WHEEZING FOR SHORTNESS OF BREATH 18 g 0  . atorvastatin (LIPITOR) 80 MG tablet TAKE ONE TABLET BY MOUTH ONCE DAILY AT 6 PM. 90 tablet 3  . carvedilol (COREG) 12.5 MG tablet Take 1 tablet by mouth twice daily 180 tablet 2  . clopidogrel (PLAVIX) 75 MG tablet Take 1 tablet by mouth daily 90 tablet 3  . furosemide (LASIX) 20 MG tablet Take 1 tablet (20 mg total) by mouth every other day. 45 tablet 3  . meclizine (ANTIVERT) 25 MG tablet Take 1 tablet (25 mg total) by mouth 3 (three) times daily as needed for dizziness. 30 tablet 0  . mexiletine (MEXITIL) 200 MG capsule Take 1 capsule (200 mg total) by mouth 2 (two) times daily. 60 capsule 5  . nitroGLYCERIN (NITROSTAT) 0.4 MG SL tablet Place 1 tablet (0.4 mg total) under the tongue every 5 (five) minutes as needed for chest pain. 25 tablet 4  . sildenafil (VIAGRA) 100 MG tablet Take 0.5-1 tablets (50-100 mg total) by mouth daily as needed for erectile dysfunction. 5 tablet 11  . TASIGNA 150 MG capsule TAKE 2 CAPSULES (300 MG TOTAL) BY MOUTH EVERY 12 (TWELVE) HOURS. TAKE ON AN EMPTY STOMACH, 1 HR BEFORE OR 2 HRS AFTER FOOD. 112 capsule 10  . valsartan (DIOVAN) 160 MG tablet Take 1 tablet by mouth once daily 90 tablet 3   No current facility-administered medications on  file prior to visit.   Allergies  Allergen Reactions  . Norvasc [Amlodipine Besylate] Rash    rash  . Sulfa Antibiotics Hives    Hives    Social History   Socioeconomic History  . Marital status: Married    Spouse name: Not on file  . Number of children: 2  . Years of education: Not on file  . Highest education level: Not on file  Occupational History    Comment: retired Development worker, international [employer]  Tobacco Use  . Smoking status: Former Smoker    Packs/day: 3.00    Years: 45.00    Pack years: 135.00    Types: Cigarettes    Quit date: 08/08/1997    Years since quitting: 22.7  . Smokeless tobacco: Former Systems developer  Vaping Use  . Vaping Use: Never used  Substance and Sexual Activity  . Alcohol use: No    Alcohol/week: 0.0 standard drinks  . Drug use: No  . Sexual activity: Yes  Other Topics Concern  . Not on file  Social History Narrative  . Not on file   Social Determinants of Health   Financial Resource Strain:   . Difficulty of Paying Living Expenses: Not on file  Food Insecurity:   . Worried About Charity fundraiser in the Last Year: Not on file  . Ran Out of Food in the Last Year: Not on file  Transportation Needs:   . Lack of Transportation (Medical): Not on file  . Lack of Transportation (Non-Medical): Not on file  Physical Activity:   . Days of Exercise per Week: Not on file  . Minutes of Exercise per Session: Not on file  Stress:   . Feeling of Stress : Not on file  Social Connections:   . Frequency of Communication with Friends and Family: Not on file  . Frequency of Social Gatherings with Friends and Family: Not on file  . Attends Religious Services: Not on file  . Active Member of Clubs or Organizations: Not on file  . Attends Archivist Meetings: Not on file  . Marital Status: Not on file  Intimate Partner Violence:   . Fear of Current or Ex-Partner: Not on file  . Emotionally Abused: Not on file  . Physically Abused: Not on file  . Sexually Abused: Not  on file      Review of Systems  Gastrointestinal: Positive for diarrhea.  All other systems reviewed and are negative.      Objective:   Physical Exam Vitals reviewed.  Constitutional:      Appearance: He is well-developed.  Cardiovascular:     Rate and Rhythm: Normal rate and regular rhythm.     Heart sounds: Normal heart sounds. No murmur heard.   Pulmonary:     Effort: Pulmonary effort is normal. No respiratory distress.     Breath sounds: Normal breath sounds. No wheezing or rales.  Abdominal:     General: Bowel sounds are normal. There is no distension.     Palpations: Abdomen is soft. There is no mass.     Tenderness: There is no abdominal tenderness. There is no guarding or rebound.           Assessment & Plan:  Dyspnea on exertion - Plan: EKG 12-Lead, Brain natriuretic peptide, CBC with Differential/Platelet, D-dimer, quantitative (not at [facility]), DG Chest 2 View  Cardiomyopathy, ischemic  History of ASCVD  ICD (implantable cardioverter-defibrillator) in place   Patient had normal lab work September 17 with a hemoglobin of 11.5 and normal renal function.  I will send the patient today for a chest x-ray.  Given his history of cancer and his atypical left-sided chest pain I will also check a D-dimer and if positive will check a CT scan.  I will check a BNP.  EKG today shows a left bundle branch block with a left axis deviation.  However there is no significant change compared to his EKG from April of this year.  I believe most likely that the patient's dyspnea on exertion is due to worsening congestive heart failure.  There is also the possibility of underlying ischemia.  If his CBC shows no anemia, if his chest x-ray is clear, if his D-dimer is normal, I will expedite cardiology consultation as I  believe the patient requires an echocardiogram along with a stress test for further evaluation.

## 2020-05-19 ENCOUNTER — Other Ambulatory Visit: Payer: Self-pay

## 2020-05-19 ENCOUNTER — Other Ambulatory Visit: Payer: Self-pay | Admitting: Family Medicine

## 2020-05-19 DIAGNOSIS — I255 Ischemic cardiomyopathy: Secondary | ICD-10-CM

## 2020-05-19 DIAGNOSIS — R0789 Other chest pain: Secondary | ICD-10-CM

## 2020-05-19 DIAGNOSIS — Z8679 Personal history of other diseases of the circulatory system: Secondary | ICD-10-CM

## 2020-05-19 DIAGNOSIS — R7989 Other specified abnormal findings of blood chemistry: Secondary | ICD-10-CM

## 2020-05-19 DIAGNOSIS — R0609 Other forms of dyspnea: Secondary | ICD-10-CM

## 2020-05-19 LAB — CBC WITH DIFFERENTIAL/PLATELET
Absolute Monocytes: 696 cells/uL (ref 200–950)
Basophils Absolute: 74 cells/uL (ref 0–200)
Basophils Relative: 1 %
Eosinophils Absolute: 133 cells/uL (ref 15–500)
Eosinophils Relative: 1.8 %
HCT: 33.2 % — ABNORMAL LOW (ref 38.5–50.0)
Hemoglobin: 11.1 g/dL — ABNORMAL LOW (ref 13.2–17.1)
Lymphs Abs: 888 cells/uL (ref 850–3900)
MCH: 30.4 pg (ref 27.0–33.0)
MCHC: 33.4 g/dL (ref 32.0–36.0)
MCV: 91 fL (ref 80.0–100.0)
MPV: 10.2 fL (ref 7.5–12.5)
Monocytes Relative: 9.4 %
Neutro Abs: 5609 cells/uL (ref 1500–7800)
Neutrophils Relative %: 75.8 %
Platelets: 228 10*3/uL (ref 140–400)
RBC: 3.65 10*6/uL — ABNORMAL LOW (ref 4.20–5.80)
RDW: 14.3 % (ref 11.0–15.0)
Total Lymphocyte: 12 %
WBC: 7.4 10*3/uL (ref 3.8–10.8)

## 2020-05-19 LAB — D-DIMER, QUANTITATIVE: D-Dimer, Quant: 0.86 mcg/mL FEU — ABNORMAL HIGH (ref <0.50)

## 2020-05-19 LAB — BRAIN NATRIURETIC PEPTIDE: Brain Natriuretic Peptide: 274 pg/mL — ABNORMAL HIGH (ref <100)

## 2020-05-25 NOTE — Progress Notes (Signed)
Cardiology Clinic Note   Patient Name: Anthony Wolfe Date of Encounter: 05/27/2020  Primary Care Provider:  [name], MD Primary Cardiologist:  Anthony Axe, MD  Patient Profile    Anthony Wolfe 74 year old male presents the clinic today for follow-up of his DOE and chest discomfort.  Past Medical History    Past Medical History:  Diagnosis Date  . AICD (automatic cardioverter/defibrillator) present   . Allergic rhinitis   . 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 Anxiety   . Blood dyscrasia    cmleukemia  . C. difficile colitis 06/16/2014  . CAD (coronary artery disease)    a. s/p 3v CABG 2010. b. NSTEMI 05/2014 in setting of VT. Cath impressions: "Recent IMI with occluded SVG to RCA. Has Right to Right collaterals and collaterals from septal and OM. EF 40%."  . Carotid disease, bilateral ([place])    a. Right CEA 2002; known occluded left carotid. b. Duplex 06/2014: known occluded LICA, stable 9-50% RICA s/p CEA.  . Chronic systolic CHF (congestive heart failure) ([place])   . 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 Depression   . Emphysema of lung ([place])   . HTN (hypertension)   . Hyperlipidemia   . Ischemic cardiomyopathy    a. Prior EF 36%. b. 2014: 50%. c. 05/2014: EF 35-40% by echo, 40% with inf HK by cath.  . NSTEMI (non-ST elevation myocardial infarction) ([place]) 05/2014  . Pleural effusion 05/2014  . Pneumonia 05/23/2014  . Presence of permanent cardiac pacemaker   . Prostate hypertrophy   . PVC's (premature ventricular contractions)    a. PVC's with prior Holter showing PVC load of 22%.  . Shortness of breath dyspnea   . Stroke ([place]) 01  . TIA (transient ischemic attack)    ASPVD, S./P. right CEA, 2002, and known occluded left carotid  . Ventricular tachycardia ([place])    a. Admitted with VT 05/2013 - EPS with inducible sustained monomorphic VT; s/p Med[employer]onic ICD 05/21/14.   Past Surgical History:  Procedure Laterality Date  . BACK SURGERY     . CAROTID ENDARTERECTOMY Right 01  . carotidectomy  2003   right side  . COLONOSCOPY  07/2010  . CORONARY ARTERY BYPASS GRAFT  02/2009   3 vessel  . ELECTROPHYSIOLOGIC STUDY  05/21/14  . ELECTROPHYSIOLOGY STUDY N/A 05/21/2014   Procedure: ELECTROPHYSIOLOGY STUDY;  Surgeon: Anthony Sprang, MD;  Location: [facility] C[facility] LAB;  Service: Cardiovascular;  Laterality: N/A;  . EP IMPLANTABLE DEVICE  05/21/14   single chamber Metronic ICD  . LEFT HEART CATHETERIZATION WITH CORONARY/GRAFT ANGIOGRAM N/A 05/19/2014   Procedure: LEFT HEART CATHETERIZATION WITH Anthony Wolfe;  Surgeon: Anthony Hector, MD;  Location: [facility] CATH LAB;  Service: Cardiovascular;  Laterality: N/A;  . PLEURAL BIOPSY N/A 11/27/2014   Procedure: PLEURAL BIOPSY;  Surgeon: Anthony Nakayama, MD;  Location: [place];  Service: Thoracic;  Laterality: N/A;  . PLEURAL EFFUSION DRAINAGE Right 11/27/2014   Procedure: DRAINAGE OF PLEURAL EFFUSION;  Surgeon: Anthony Nakayama, MD;  Location: [place];  Service: Thoracic;  Laterality: Right;  . TALC PLEURODESIS Right 11/27/2014   Procedure: Anthony Wolfe;  Surgeon: Anthony Nakayama, MD;  Location: [place];  Service: Thoracic;  Laterality: Right;  Marland Kitchen VIDEO ASSISTED THORACOSCOPY Right 11/27/2014   Procedure: RIGHT VIDEO ASSISTED THORACOSCOPY;  Surgeon: Anthony Nakayama, MD;  Location: [place];  Service: Thoracic;  Laterality: Right;    Allergies  Allergies  Allergen Reactions  . Norvasc [  Amlodipine Besylate] Rash    rash  . Sulfa Antibiotics Hives    Hives     History of Present Illness    Anthony Wolfe has past medical history of carotid stenosis status post carotid endarterectomy, coronary disease status post CABG times 10/2008, NSTEMI 2015 in the setting of VT, ventricular tachycardia status post ICD implantation 10/15, anxiety, depression, emphysema, HTN, HLD, ischemic cardiomyopathy, PVCs, CVA, and TIA.  He was last seen by Dr. Caryl Wolfe 4/21.  At that time he complained of  exertional chest discomfort and DOE.  He was recommended that he undergo nuclear stress testing to rule out progressing CAD, chest CT to evaluate lungs, and echocardiogram to assess LV function.  He was started on low-dose furosemide due to being volume overloaded.  Patient tolerance to amiodarone and insurance initial denied use of mexiletine.  Started on mexiletine  8/21.  Recent CMP on 04/24/2020 stable.  Lipid panel 03/26/2020 shows LDL 42.  He presents the clinic today for follow-up evaluation states he remains physically active and planted a big garden this year.  He has noticed that he becomes short of breath quicker than usual.  He stays away from salt with his diet and does not add salt to his food.  He uses his albuterol inhaler as needed and states that it helps with the shortness of breath but causes him to bring up phlegm.  His blood pressure is elevated in clinic this morning at 160/82 however, he did not take his blood pressure medications this morning.  I will give him a salty six diet sheet, have him keep a blood pressure log, order an echocardiogram, and have him follow-up in 1 month.  Today he denies chest pain, lower extremity edema, fatigue, palpitations, melena, hematuria, hemoptysis, diaphoresis, weakness, presyncope, syncope, orthopnea, and PND.   Home Medications    Prior to Admission medications   Medication Sig Start Date End Date Taking? Authorizing Provider  albuterol (VENTOLIN HFA) 108 (90 Base) MCG/ACT inhaler INHALE 2 PUFFS BY MOUTH EVERY 6 HOURS AS NEEDED FOR WHEEZING FOR SHORTNESS OF BREATH 02/26/20   Anthony Frizzle, MD  atorvastatin (LIPITOR) 80 MG tablet TAKE ONE TABLET BY MOUTH ONCE DAILY AT 6 PM. 10/08/19   Anthony Frizzle, MD  carvedilol (COREG) 12.5 MG tablet Take 1 tablet by mouth twice daily 02/14/20   Anthony Sprang, MD  clopidogrel (PLAVIX) 75 MG tablet Take 1 tablet by mouth daily 07/17/19   Anthony Frizzle, MD  furosemide (LASIX) 20 MG tablet Take 1 tablet  (20 mg total) by mouth every other day. 11/11/19   Anthony Sprang, MD  meclizine (ANTIVERT) 25 MG tablet Take 1 tablet (25 mg total) by mouth 3 (three) times daily as needed for dizziness. 04/21/20   Anthony Frizzle, MD  mexiletine (MEXITIL) 200 MG capsule Take 1 capsule (200 mg total) by mouth 2 (two) times daily. 03/26/20   Anthony Frizzle, MD  nitroGLYCERIN (NITROSTAT) 0.4 MG SL tablet Place 1 tablet (0.4 mg total) under the tongue every 5 (five) minutes as needed for chest pain. 09/16/19   Isaiah Serge, NP  sildenafil (VIAGRA) 100 MG tablet Take 0.5-1 tablets (50-100 mg total) by mouth daily as needed for erectile dysfunction. 09/06/16   Anthony Frizzle, MD  TASIGNA 150 MG capsule TAKE 2 CAPSULES (300 MG TOTAL) BY MOUTH EVERY 12 (TWELVE) HOURS. TAKE ON AN EMPTY STOMACH, 1 HR BEFORE OR 2 HRS AFTER FOOD. 07/24/19   [name], MD  valsartan (DIOVAN) 160 MG tablet Take 1 tablet by mouth once daily 11/06/19   Anthony Frizzle, MD    Family History    Family History  Problem Relation Age of Onset  . Heart disease Mother   . Alcohol abuse Father    He indicated that his mother is deceased. He indicated that his father is deceased. He indicated that his sister is alive. He indicated that his maternal grandmother is deceased. He indicated that his maternal grandfather is deceased. He indicated that his paternal grandmother is deceased. He indicated that his paternal grandfather is deceased.  Social History    Social History   Socioeconomic History  . Marital status: Married    Spouse name: Not on file  . Number of children: 2  . Years of education: Not on file  . Highest education level: Not on file  Occupational History    Comment: retired Development worker, international aid  Tobacco Use  . Smoking status: Former Smoker    Packs/day: 3.00    Years: 45.00    Pack years: 135.00    Types: Cigarettes    Quit date: 08/08/1997    Years since quitting: 22.8  . Smokeless tobacco: Former Network engineer  .  Vaping Use: Never used  Substance and Sexual Activity  . Alcohol use: No    Alcohol/week: 0.0 standard drinks  . Drug use: No  . Sexual activity: Yes  Other Topics Concern  . Not on file  Social History Narrative  . Not on file   Social Determinants of Health   Financial Resource Strain:   . Difficulty of Paying Living Expenses: Not on file  Food Insecurity:   . Worried About Charity fundraiser in the Last Year: Not on file  . Ran Out of Food in the Last Year: Not on file  Transportation Needs:   . Lack of Transportation (Medical): Not on file  . Lack of Transportation (Non-Medical): Not on file  Physical Activity:   . Days of Exercise per Week: Not on file  . Minutes of Exercise per Session: Not on file  Stress:   . Feeling of Stress : Not on file  Social Connections:   . Frequency of Communication with Friends and Family: Not on file  . Frequency of Social Gatherings with Friends and Family: Not on file  . Attends Religious Services: Not on file  . Active Member of Clubs or Organizations: Not on file  . Attends Archivist Meetings: Not on file  . Marital Status: Not on file  Intimate Partner Violence:   . Fear of Current or Ex-Partner: Not on file  . Emotionally Abused: Not on file  . Physically Abused: Not on file  . Sexually Abused: Not on file     Review of Systems    General:  No chills, fever, night sweats or weight changes.  Cardiovascular:  No chest pain, dyspnea on exertion, edema, orthopnea, palpitations, paroxysmal nocturnal dyspnea. Dermatological: No rash, lesions/masses Respiratory: No cough, dyspnea Urologic: No hematuria, dysuria Abdominal:   No nausea, vomiting, diarrhea, bright red blood per rectum, melena, or hematemesis Neurologic:  No visual changes, wkns, changes in mental status. All other systems reviewed and are otherwise negative except as noted above.  Physical Exam    VS:  BP (!) 160/82   Pulse 76   Ht 5\' 9"  (1.753 m)    Wt 176 lb (79.8 kg)   SpO2 97%   BMI 25.99 kg/m  ,  BMI Body mass index is 25.99 kg/m. GEN: Well nourished, well developed, in no acute distress. HEENT: normal. Neck: Supple, no JVD, carotid bruits, or masses. Cardiac: RRR, no murmurs, rubs, or gallops. No clubbing, cyanosis, edema.  Radials/DP/PT 2+ and equal bilaterally.  Respiratory:  Respirations regular and unlabored, clear to auscultation bilaterally. GI: Soft, nontender, nondistended, BS + x 4. MS: no deformity or atrophy. Skin: warm and dry, no rash. Neuro:  Strength and sensation are intact. Psych: Normal affect.  Accessory Clinical Findings    Recent Labs: 06/17/2019: Magnesium 1.8 04/24/2020: ALT 10; BUN 11; Creatinine, Ser 0.90; Potassium 3.7; Sodium 143 05/18/2020: Brain Natriuretic Peptide 274; Hemoglobin 11.1; Platelets 228   Recent Lipid Panel    Component Value Date/Time   CHOL 91 03/26/2020 1021   CHOL 102 03/15/2017 1129   TRIG 75 03/26/2020 1021   HDL 33 (L) 03/26/2020 1021   HDL 31 (L) 03/15/2017 1129   CHOLHDL 2.8 03/26/2020 1021   VLDL 20 04/05/2016 0830   LDLCALC 42 03/26/2020 1021    ECG personally reviewed by me today-none today.  Echocardiogram 06/07/2016 Study Conclusions   - Left ventricle: Global longitudinal LV strain is abnormal at -12%  The cavity size was normal. There was moderate focal basal and  mild concentric hypertrophy. Systolic function was mildly  reduced. The estimated ejection fraction was in the range of 45%  to 50%. There is akinesis of the basal-midinferior myocardium.  - Aortic valve: Mildly to moderately calcified annulus. Trileaflet;  mildly thickened, mildly calcified leaflets.  - Aorta: Aortic root dimension: 38 mm (ED).  - Aortic root: The aortic root was mildly dilated.  - Mitral valve: There was trivial regurgitation.  - Left atrium: The atrium was mildly to moderately dilated.  - Right ventricle: The cavity size was mildly dilated. Wall  thickness  was normal.  - Tricuspid valve: There was trivial regurgitation.  Assessment & Plan   1.  Chest pain-no recurrent episodes of chest discomfort.  Denies exertional chest discomfort. Continue carvedilol atorvastatin, furosemide, nitroglycerin, Plavix Heart healthy low-sodium diet-salty 6 given Increase physical activity as tolerated  DOE- worse over the last year.  Continues to be fairly physically active gardening and doing housework/yard work. Heart healthy low-sodium diet-salty 6 given Increase physical activity as tolerated Repeat echo  Essential hypertension-BP today 160/82.  Better control at home 140s over 80s.  Did not take his blood pressure medication prior to his visit. Continue valsartan, carvedilol, furosemide Heart healthy low-sodium diet-salty 6 given Increase physical activity as tolerated Blood pressure log-instructed to take his blood pressure an hour after he takes his blood pressure medication  Ventricular tachycardia-heart rate today 76.  Noted to have episode of VT 11/18 he was asymptomatic.  ICD implanted for VT 10/15.  Device remote transmission 03/15/2020, normal, good battery status, leads unchanged, histogram appropriate. Continue mexiletine Follows with EP  Disposition: Follow-up in 1 month.   Jossie Ng. Jersie Beel NP-C    05/27/2020, 8:55 AM [address] Office [phone] Fax [phone]  Notice: This dictation was prepared with Dragon dictation along with smaller phrase technology. Any transcriptional errors that result from this process are unintentional and may not be corrected upon review.

## 2020-05-27 ENCOUNTER — Other Ambulatory Visit: Payer: Self-pay

## 2020-05-27 ENCOUNTER — Ambulatory Visit: Payer: Medicare HMO | Admitting: General Practice

## 2020-05-27 ENCOUNTER — Encounter: Payer: Self-pay | Admitting: General Practice

## 2020-05-27 VITALS — BP 160/82 | HR 76 | Ht 69.0 in | Wt 176.0 lb

## 2020-05-27 DIAGNOSIS — I10 Essential (primary) hypertension: Secondary | ICD-10-CM

## 2020-05-27 DIAGNOSIS — R079 Chest pain, unspecified: Secondary | ICD-10-CM

## 2020-05-27 DIAGNOSIS — I472 Ventricular tachycardia, unspecified: Secondary | ICD-10-CM

## 2020-05-27 DIAGNOSIS — R0600 Dyspnea, unspecified: Secondary | ICD-10-CM | POA: Diagnosis not present

## 2020-05-27 NOTE — Patient Instructions (Signed)
Medication Instructions:  Your physician recommends that you continue on your current medications as directed. Please refer to the Current Medication list given to you today.  *If you need a refill on your cardiac medications before your next appointment, please call your pharmacy*   Lab Work: None today If you have labs (blood work) drawn today and your tests are completely normal, you will receive your results only by: Marland Kitchen MyChart Message (if you have MyChart) OR . A paper copy in the mail If you have any lab test that is abnormal or we need to change your treatment, we will call you to review the results.   Testing/Procedures: Your physician recommends that you continue on your current medications as directed. Please refer to the Current Medication list given to you today.    Follow-Up: At [facility], you and your health needs are our priority.  As part of our continuing mission to provide you with exceptional heart care, we have created designated Provider Care Teams.  These Care Teams include your primary Cardiologist (physician) and Advanced Practice Providers (APPs -  Physician Assistants and Nurse Practitioners) who all work together to provide you with the care you need, when you need it.  We recommend signing up for the patient portal called "MyChart".  Sign up information is provided on this After Visit Summary.  MyChart is used to connect with patients for Virtual Visits (Telemedicine).  Patients are able to view lab/test results, encounter notes, upcoming appointments, etc.  Non-urgent messages can be sent to your provider as well.   To learn more about what you can do with MyChart, go to [email or web address].    Your next appointment:   1 month(s)  The format for your next appointment:   In Person  Provider:   Bernerd Pho, PA-C   Other Instructions Keep daily BP log and bring at the next visit.Check BP 1 hour after taking medications.   The Salty Six  brochure given to you today to read.       Thank you for choosing [facility] !

## 2020-05-28 ENCOUNTER — Other Ambulatory Visit: Payer: Self-pay

## 2020-05-28 ENCOUNTER — Ambulatory Visit (HOSPITAL_COMMUNITY)
Admission: RE | Admit: 2020-05-28 | Discharge: 2020-05-28 | Disposition: A | Discharge status: Home / Self Care | Payer: Medicare HMO | Source: Ambulatory Visit | Attending: General Practice | Admitting: General Practice

## 2020-05-28 DIAGNOSIS — I340 Nonrheumatic mitral (valve) insufficiency: Secondary | ICD-10-CM

## 2020-05-28 DIAGNOSIS — R0600 Dyspnea, unspecified: Secondary | ICD-10-CM | POA: Diagnosis not present

## 2020-05-28 DIAGNOSIS — I351 Nonrheumatic aortic (valve) insufficiency: Secondary | ICD-10-CM | POA: Diagnosis not present

## 2020-05-28 LAB — ECHOCARDIOGRAM COMPLETE
AR max vel: 1.81 cm2
AV Area VTI: 1.88 cm2
AV Area mean vel: 1.85 cm2
AV Mean grad: 3.8 mmHg
AV Peak grad: 8.9 mmHg
Ao pk vel: 1.49 m/s
Area-P 1/2: 5.38 cm2
Calc EF: 48.1 %
S' Lateral: 4.07 cm
Single Plane A2C EF: 48.1 %
Single Plane A4C EF: 46.5 %

## 2020-05-28 NOTE — Progress Notes (Signed)
*  PRELIMINARY RESULTS* Echocardiogram 2D Echocardiogram has been performed.  Anthony Wolfe 05/28/2020, 9:28 AM

## 2020-06-01 ENCOUNTER — Other Ambulatory Visit: Payer: Self-pay

## 2020-06-01 ENCOUNTER — Ambulatory Visit (INDEPENDENT_AMBULATORY_CARE_PROVIDER_SITE_OTHER): Payer: Medicare HMO

## 2020-06-01 DIAGNOSIS — Z79899 Other long term (current) drug therapy: Secondary | ICD-10-CM

## 2020-06-01 DIAGNOSIS — I2510 Atherosclerotic heart disease of native coronary artery without angina pectoris: Secondary | ICD-10-CM

## 2020-06-01 DIAGNOSIS — I255 Ischemic cardiomyopathy: Secondary | ICD-10-CM

## 2020-06-01 DIAGNOSIS — I10 Essential (primary) hypertension: Secondary | ICD-10-CM

## 2020-06-01 MED ORDER — VALSARTAN 160 MG PO TABS
240.0000 mg | ORAL_TABLET | Freq: Every day | ORAL | 6 refills | Status: DC
Start: 2020-06-01 — End: 2020-11-02

## 2020-06-02 LAB — CUP PACEART REMOTE DEVICE CHECK
Battery Remaining Longevity: 58 mo
Battery Voltage: 2.99 V
Brady Statistic RV Percent Paced: 0.02 %
Date Time Interrogation Session: 20211025012204
HighPow Impedance: 57 Ohm
Implantable Lead Implant Date: 20151014
Implantable Lead Location: 753860
Implantable Pulse Generator Implant Date: 20151014
Lead Channel Impedance Value: 361 Ohm
Lead Channel Impedance Value: 399 Ohm
Lead Channel Pacing Threshold Amplitude: 0.75 V
Lead Channel Pacing Threshold Pulse Width: 0.4 ms
Lead Channel Sensing Intrinsic Amplitude: 9.25 mV
Lead Channel Sensing Intrinsic Amplitude: 9.25 mV
Lead Channel Setting Pacing Amplitude: 2 V
Lead Channel Setting Pacing Pulse Width: 0.4 ms
Lead Channel Setting Sensing Sensitivity: 0.3 mV

## 2020-06-03 ENCOUNTER — Other Ambulatory Visit: Payer: Medicare HMO

## 2020-06-04 NOTE — Progress Notes (Signed)
Remote ICD transmission.   

## 2020-06-10 DIAGNOSIS — M79641 Pain in right hand: Secondary | ICD-10-CM | POA: Diagnosis not present

## 2020-06-15 DIAGNOSIS — M79641 Pain in right hand: Secondary | ICD-10-CM | POA: Diagnosis not present

## 2020-06-18 ENCOUNTER — Telehealth: Payer: Self-pay

## 2020-06-18 NOTE — Telephone Encounter (Signed)
-----   Message from Laurita Quint sent at 06/17/2020  3:13 PM EST ----- Regarding: e-sign order Hello, this patient is scheduled to come in on 06/22/2020 for CT Scanogram and this order needs to be e-signed by the physician.  Thank you so much for your help with this.

## 2020-06-19 ENCOUNTER — Other Ambulatory Visit: Payer: Self-pay | Admitting: Family Medicine

## 2020-06-19 DIAGNOSIS — I255 Ischemic cardiomyopathy: Secondary | ICD-10-CM

## 2020-06-19 DIAGNOSIS — R0609 Other forms of dyspnea: Secondary | ICD-10-CM

## 2020-06-19 DIAGNOSIS — R7989 Other specified abnormal findings of blood chemistry: Secondary | ICD-10-CM

## 2020-06-19 DIAGNOSIS — R0789 Other chest pain: Secondary | ICD-10-CM

## 2020-06-19 DIAGNOSIS — Z8679 Personal history of other diseases of the circulatory system: Secondary | ICD-10-CM

## 2020-06-19 NOTE — Telephone Encounter (Signed)
I have not received any order. I will be glad to sign but nothing has been sent to me to sign.

## 2020-06-22 ENCOUNTER — Ambulatory Visit (HOSPITAL_COMMUNITY): Payer: Medicare HMO

## 2020-06-22 NOTE — Telephone Encounter (Signed)
Order was not sent to Provider for CT Scanogram.

## 2020-06-25 ENCOUNTER — Ambulatory Visit: Payer: Medicare HMO | Admitting: Student

## 2020-07-16 ENCOUNTER — Other Ambulatory Visit: Payer: Self-pay | Admitting: Family Medicine

## 2020-07-24 ENCOUNTER — Inpatient Hospital Stay: Payer: Medicare HMO | Attending: Hematology and Oncology

## 2020-07-24 ENCOUNTER — Other Ambulatory Visit: Payer: Self-pay

## 2020-07-24 DIAGNOSIS — Z79899 Other long term (current) drug therapy: Secondary | ICD-10-CM | POA: Insufficient documentation

## 2020-07-24 DIAGNOSIS — I10 Essential (primary) hypertension: Secondary | ICD-10-CM | POA: Insufficient documentation

## 2020-07-24 DIAGNOSIS — D6481 Anemia due to antineoplastic chemotherapy: Secondary | ICD-10-CM | POA: Insufficient documentation

## 2020-07-24 DIAGNOSIS — C9210 Chronic myeloid leukemia, BCR/ABL-positive, not having achieved remission: Secondary | ICD-10-CM | POA: Insufficient documentation

## 2020-07-24 LAB — COMPREHENSIVE METABOLIC PANEL
ALT: 10 U/L (ref 0–44)
AST: 11 U/L — ABNORMAL LOW (ref 15–41)
Albumin: 3.2 g/dL — ABNORMAL LOW (ref 3.5–5.0)
Alkaline Phosphatase: 100 U/L (ref 38–126)
Anion gap: 5 (ref 5–15)
BUN: 15 mg/dL (ref 8–23)
CO2: 27 mmol/L (ref 22–32)
Calcium: 8.8 mg/dL — ABNORMAL LOW (ref 8.9–10.3)
Chloride: 109 mmol/L (ref 98–111)
Creatinine, Ser: 0.9 mg/dL (ref 0.61–1.24)
GFR, Estimated: >60 mL/min (ref >60)
Glucose, Bld: 118 mg/dL — ABNORMAL HIGH (ref 70–99)
Potassium: 3.7 mmol/L (ref 3.5–5.1)
Sodium: 141 mmol/L (ref 135–145)
Total Bilirubin: 0.7 mg/dL (ref 0.3–1.2)
Total Protein: 6.8 g/dL (ref 6.5–8.1)

## 2020-07-24 LAB — CBC WITH DIFFERENTIAL/PLATELET
Abs Immature Granulocytes: 0.03 10*3/uL (ref 0.00–0.07)
Basophils Absolute: 0.1 10*3/uL (ref 0.0–0.1)
Basophils Relative: 1 %
Eosinophils Absolute: 0.1 10*3/uL (ref 0.0–0.5)
Eosinophils Relative: 2 %
HCT: 35.9 % — ABNORMAL LOW (ref 39.0–52.0)
Hemoglobin: 11.6 g/dL — ABNORMAL LOW (ref 13.0–17.0)
Immature Granulocytes: 1 %
Lymphocytes Relative: 14 %
Lymphs Abs: 0.9 10*3/uL (ref 0.7–4.0)
MCH: 30.1 pg (ref 26.0–34.0)
MCHC: 32.3 g/dL (ref 30.0–36.0)
MCV: 93 fL (ref 80.0–100.0)
Monocytes Absolute: 0.5 10*3/uL (ref 0.1–1.0)
Monocytes Relative: 8 %
Neutro Abs: 4.8 10*3/uL (ref 1.7–7.7)
Neutrophils Relative %: 74 %
Platelets: 218 10*3/uL (ref 150–400)
RBC: 3.86 MIL/uL — ABNORMAL LOW (ref 4.22–5.81)
RDW: 14.3 % (ref 11.5–15.5)
WBC: 6.5 10*3/uL (ref 4.0–10.5)
nRBC: 0 % (ref 0.0–0.2)

## 2020-07-30 LAB — BCR/ABL

## 2020-08-03 ENCOUNTER — Encounter: Payer: Self-pay | Admitting: Hematology and Oncology

## 2020-08-03 ENCOUNTER — Inpatient Hospital Stay: Payer: Medicare HMO | Admitting: Hematology and Oncology

## 2020-08-03 ENCOUNTER — Other Ambulatory Visit: Payer: Self-pay

## 2020-08-03 ENCOUNTER — Telehealth: Payer: Self-pay

## 2020-08-03 DIAGNOSIS — T451X5A Adverse effect of antineoplastic and immunosuppressive drugs, initial encounter: Secondary | ICD-10-CM | POA: Diagnosis not present

## 2020-08-03 DIAGNOSIS — C9210 Chronic myeloid leukemia, BCR/ABL-positive, not having achieved remission: Secondary | ICD-10-CM

## 2020-08-03 DIAGNOSIS — I10 Essential (primary) hypertension: Secondary | ICD-10-CM | POA: Diagnosis not present

## 2020-08-03 DIAGNOSIS — Z79899 Other long term (current) drug therapy: Secondary | ICD-10-CM | POA: Diagnosis not present

## 2020-08-03 DIAGNOSIS — D6481 Anemia due to antineoplastic chemotherapy: Secondary | ICD-10-CM

## 2020-08-03 DIAGNOSIS — Z299 Encounter for prophylactic measures, unspecified: Secondary | ICD-10-CM | POA: Diagnosis not present

## 2020-08-03 NOTE — Assessment & Plan Note (Signed)
He is overdue for influenza vaccination He is waiting for further work-up by his primary care doctor before he proceed with influenza vaccination I reminded him to proceed as soon as possible

## 2020-08-03 NOTE — Progress Notes (Signed)
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 The patient denies recent history of bleeding such as epistaxis, hematuria or hematochezia. He is asymptomatic from the anemia. We will observe for now.  HTN (hypertension) He is noted to have elevated blood pressure today but he blames it on poor choices of food after the holidays According to the patient, his blood pressure monitoring at home is better We discussed the importance of dietary modification and lifestyle changes due to his cardiac issues  Preventive measure He is overdue for influenza vaccination He is waiting for further work-up by his primary care doctor before he proceed with influenza vaccination I reminded him to proceed as soon as possible   No orders of the defined types were placed in this encounter.   All questions were answered. The patient knows to call the clinic with any problems, questions or concerns. The total time spent in the appointment was 20 minutes encounter with patients including review of chart and various tests results, discussions about plan of care  and coordination of care plan   [name], MD 08/03/2020 9:05 AM  INTERVAL HISTORY: Please see below for problem oriented charting. He returns with his wife for further follow-up He is compliant taking his medication as prescribed He is undergoing extensive evaluation for possible valvular issues He denies recent chest pain or shortness of breath His blood pressure is noted to be high but according to the patient, his blood pressure monitoring at home is better He did admit he ate a lot of ham over the weekend He has not received influenza vaccination yet  SUMMARY OF ONCOLOGIC HISTORY: Oncology History  Chronic myelogenous leukemia ([place])  05/04/2012 Bone Marrow Biopsy   BM confirmed diagnosis of CML in Chronic phase. BCR/ABL by PCR detected abnormalitis with b2a2 & b3a2 subtypes   05/09/2012 - 03/16/2015 Chemotherapy   He was started on treatment with Dasatinib 100 mg daily   06/06/2012 Adverse Reaction   Dose of medication was reduced to 50 mg daily due to pancytopenia   01/24/2013 Progression   Patient was noted to have elevated blood count which and was subsequently found to be noncompliant to treatment. The patient has not been on treatment for several months because his prescription ran out. He was restarted back on treatment   01/16/2014 Progression   Bcr/ABL by PCR is worse. Dose of Dasatinib was increased to 100 mg.   02/26/2014 Tumor Marker   Blood work for ABL kinase mutation was negative.   10/29/2014 Tumor Marker   BCR/ABL b2a2 & b3a2 0.29%, IS 0.1624%, not in MMR yet but improving   11/05/2014 Adverse Reaction   He had thoracentesis due to pleural effusion   11/27/2014 Surgery   He  underwent right video-assisted thoracoscopy, Drainage of pleural effusion, Pleural biopsy, Diaphragm biopsy, Lung biopsy & [place]c pleurodesis   01/30/2015 Tumor Marker   BCR/ABL b2a2 & b3a2 0.78%, IS 0.4368%, not in MMR    03/11/2015 Pathology Results   BCR/ABL b2a2 0.19%, IS 0.1064%, not in  MMR    03/16/2015 Adverse Reaction   Dasatinib was stopped due to recurrent pleural effusion   03/28/2015 - 11/06/2017 Chemotherapy   He started on Bosutinib   03/30/2015 Procedure   He has therapeutic thoracentesis of the right lung with 1 liter of fluid removed   03/30/2015 Adverse Reaction   Bosutinib is placed on hold, to be restart on 8/25 at 250 mg due to severe diarrhea   04/28/2015 Tumor Marker   BCR/ABL b2a2 0.06%, IS 0.0336%, in MMR    06/10/2015 Tumor Marker   BCR/ABL b2a2 0.14%, IS 0.1162%, not in MMR    08/14/2015 Pathology Results   BCR/ABL b2a2 0.02%, IS 0.0166%, In MMR    11/09/2015 Tumor Marker   BCR/ABL b2a2 0.005%, IS 0.0042%, In MMR    02/08/2016 Tumor Marker   BCR/ABL undetectable. In MMR   06/13/2016 Tumor Marker   BCR/ABL undetectable. In MMR   12/06/2016 Pathology Results   BCR/ABL undetectable. In MMR   06/08/2017 Pathology Results   BCR/ABL undetectable. In MMR   09/08/2017 Pathology Results   BCR/ABL undetectable. In MMR   12/11/2017 Pathology Results   BCR/ABL undetectable. In MMR   03/19/2018 Pathology Results   BCR/ABL undetectable. In MMR   06/12/2018 Pathology Results   BCR/ABL undetectable. In MMR   09/06/2018 Pathology Results   BCR ABL is detectable. e13a2 (b2a2) and [id number]) IS: 0.009%   09/26/2018 -  Chemotherapy   The patient had Tasigna    11/21/2018 Pathology Results   BCR/ABL undetectable. In MMR   03/07/2019 Pathology Results   BCR/ABL undetectable. In MMR   06/17/2019 Pathology Results   BCR/ABL undetectable. In MMR   10/14/2019 Pathology Results   BCR ABL is detectable. e13a2 (b2a2) and Y60Y3(K1S0) IS: 0.0281%. In MMR   01/14/2020 Pathology Results   BCR ABL is detectable. e13a2 (b2a2) and F09N2(T5T7) IS: 0.023%. In MMR     REVIEW OF SYSTEMS:   Constitutional: Denies fevers, chills or abnormal weight loss Eyes: Denies blurriness of vision Ears, nose, mouth, throat, and face: Denies mucositis or sore throat Respiratory: Denies  cough, dyspnea or wheezes Cardiovascular: Denies palpitation, chest discomfort or lower extremity swelling Gastrointestinal:  Denies nausea, heartburn or change in bowel habits Skin: Denies abnormal skin rashes Lymphatics: Denies new lymphadenopathy or easy bruising Neurological:Denies numbness, tingling or new weaknesses Behavioral/Psych: Mood is stable, no new changes  All other systems were reviewed with the patient and are negative.  I have reviewed the past medical history, past surgical history, social history and family history with the patient and they are unchanged from previous note.  ALLERGIES:  is allergic to norvasc [amlodipine besylate] and sulfa antibiotics.  MEDICATIONS:  Current Outpatient Medications  Medication Sig Dispense Refill  . albuterol (VENTOLIN HFA) 108 (90 Base) MCG/ACT inhaler INHALE 2 PUFFS BY MOUTH EVERY 6 HOURS AS NEEDED FOR WHEEZING FOR SHORTNESS OF BREATH 18 g 0  . atorvastatin (LIPITOR) 80 MG tablet TAKE ONE TABLET BY MOUTH ONCE DAILY AT 6 PM. 90 tablet 3  . carvedilol (COREG) 12.5 MG tablet Take 1 tablet by mouth twice daily 180 tablet 2  . clopidogrel (PLAVIX) 75 MG tablet Take 1 tablet by mouth once daily 90 tablet  0  . furosemide (LASIX) 20 MG tablet Take 1 tablet (20 mg total) by mouth every other day. 45 tablet 3  . meclizine (ANTIVERT) 25 MG tablet Take 1 tablet (25 mg total) by mouth 3 (three) times daily as needed for dizziness. 30 tablet 0  . mexiletine (MEXITIL) 200 MG capsule Take 1 capsule (200 mg total) by mouth 2 (two) times daily. 60 capsule 5  . nitroGLYCERIN (NITROSTAT) 0.4 MG SL tablet Place 1 tablet (0.4 mg total) under the tongue every 5 (five) minutes as needed for chest pain. 25 tablet 4  . sildenafil (VIAGRA) 100 MG tablet Take 0.5-1 tablets (50-100 mg total) by mouth daily as needed for erectile dysfunction. 5 tablet 11  . TASIGNA 150 MG capsule TAKE 2 CAPSULES (300 MG TOTAL) BY MOUTH EVERY 12 (TWELVE) HOURS. TAKE ON AN EMPTY  STOMACH, 1 HR BEFORE OR 2 HRS AFTER FOOD. 112 capsule 10  . valsartan (DIOVAN) 160 MG tablet Take 1.5 tablets (240 mg total) by mouth daily. 45 tablet 6   No current facility-administered medications for this visit.    PHYSICAL EXAMINATION: ECOG PERFORMANCE STATUS: 1 - Symptomatic but completely ambulatory  Vitals:   08/03/20 0849  BP: (!) 189/84  Pulse: 72  Resp: 18  Temp: 97.6 F (36.4 C)  SpO2: 100%   Filed Weights   08/03/20 0849  Weight: 180 lb 3.2 oz (81.7 kg)    GENERAL:alert, no distress and comfortable SKIN: skin color, texture, turgor are normal, no rashes or significant lesions EYES: normal, Conjunctiva are pink and non-injected, sclera clear OROPHARYNX:no exudate, no erythema and lips, buccal mucosa, and tongue normal  NECK: supple, thyroid normal size, non-tender, without nodularity LYMPH:  no palpable lymphadenopathy in the cervical, axillary or inguinal LUNGS: clear to auscultation and percussion with normal breathing effort HEART: regular rate & rhythm and no murmurs and no lower extremity edema ABDOMEN:abdomen soft, non-tender and normal bowel sounds Musculoskeletal:no cyanosis of digits and no clubbing  NEURO: alert & oriented x 3 with fluent speech, no focal motor/sensory deficits  LABORATORY DATA:  I have reviewed the data as listed    Component Value Date/Time   NA 141 07/24/2020 0904   NA 142 06/08/2017 0832   K 3.7 07/24/2020 0904   K 3.9 06/08/2017 0832   CL 109 07/24/2020 0904   CL 110 (H) 11/14/2012 0918   CO2 27 07/24/2020 0904   CO2 25 06/08/2017 0832   GLUCOSE 118 (H) 07/24/2020 0904   GLUCOSE 98 06/08/2017 0832   GLUCOSE 108 (H) 11/14/2012 0918   BUN 15 07/24/2020 0904   BUN 11.6 06/08/2017 0832   CREATININE 0.90 07/24/2020 0904   CREATININE 0.90 05/31/2019 0837   CREATININE 0.9 06/08/2017 0832   CALCIUM 8.8 (L) 07/24/2020 0904   CALCIUM 8.6 06/08/2017 0832   PROT 6.8 07/24/2020 0904   PROT 6.7 06/08/2017 0832   ALBUMIN 3.2 (L)  07/24/2020 0904   ALBUMIN 3.4 (L) 06/08/2017 0832   AST 11 (L) 07/24/2020 0904   AST 13 06/08/2017 0832   ALT 10 07/24/2020 0904   ALT 7 06/08/2017 0832   ALKPHOS 100 07/24/2020 0904   ALKPHOS 96 06/08/2017 0832   BILITOT 0.7 07/24/2020 0904   BILITOT 0.44 06/08/2017 0832   GFRNONAA >60 07/24/2020 0904   GFRNONAA 83 05/31/2019 0837   GFRAA >60 04/24/2020 0842   GFRAA 96 05/31/2019 0837    No results found for: SPEP, UPEP  Lab Results  Component Value Date   WBC  6.5 07/24/2020   NEUTROABS 4.8 07/24/2020   HGB 11.6 (L) 07/24/2020   HCT 35.9 (L) 07/24/2020   MCV 93.0 07/24/2020   PLT 218 07/24/2020      Chemistry      Component Value Date/Time   NA 141 07/24/2020 0904   NA 142 06/08/2017 0832   K 3.7 07/24/2020 0904   K 3.9 06/08/2017 0832   CL 109 07/24/2020 0904   CL 110 (H) 11/14/2012 0918   CO2 27 07/24/2020 0904   CO2 25 06/08/2017 0832   BUN 15 07/24/2020 0904   BUN 11.6 06/08/2017 0832   CREATININE 0.90 07/24/2020 0904   CREATININE 0.90 05/31/2019 0837   CREATININE 0.9 06/08/2017 0832      Component Value Date/Time   CALCIUM 8.8 (L) 07/24/2020 0904   CALCIUM 8.6 06/08/2017 0832   ALKPHOS 100 07/24/2020 0904   ALKPHOS 96 06/08/2017 0832   AST 11 (L) 07/24/2020 0904   AST 13 06/08/2017 0832   ALT 10 07/24/2020 0904   ALT 7 06/08/2017 0832   BILITOT 0.7 07/24/2020 0904   BILITOT 0.44 06/08/2017 1950

## 2020-08-03 NOTE — Assessment & Plan Note (Signed)
BCR/ABL is minimally detected He is still in major molecular response Since our last discussion, he is compliant taking his medication as prescribed I plan to see him back in 3months for further follow-up  

## 2020-08-03 NOTE — Assessment & Plan Note (Signed)
He is noted to have elevated blood pressure today but he blames it on poor choices of food after the holidays According to the patient, his blood pressure monitoring at home is better We discussed the importance of dietary modification and lifestyle changes due to his cardiac issues

## 2020-08-03 NOTE — Assessment & Plan Note (Signed)
This is likely anemia of chronic disease. The patient denies recent history of bleeding such as epistaxis, hematuria or hematochezia. He is asymptomatic from the anemia. We will observe for now.  

## 2020-08-17 ENCOUNTER — Other Ambulatory Visit: Payer: Self-pay | Admitting: Family Medicine

## 2020-08-31 ENCOUNTER — Ambulatory Visit (INDEPENDENT_AMBULATORY_CARE_PROVIDER_SITE_OTHER): Payer: Medicare HMO

## 2020-08-31 DIAGNOSIS — I472 Ventricular tachycardia, unspecified: Secondary | ICD-10-CM

## 2020-09-02 LAB — CUP PACEART REMOTE DEVICE CHECK
Battery Remaining Longevity: 53 mo
Battery Voltage: 2.98 V
Brady Statistic RV Percent Paced: 0.01 %
Date Time Interrogation Session: 20220124022603
HighPow Impedance: 54 Ohm
Implantable Lead Implant Date: 20151014
Implantable Lead Location: 753860
Implantable Pulse Generator Implant Date: 20151014
Lead Channel Impedance Value: 361 Ohm
Lead Channel Impedance Value: 418 Ohm
Lead Channel Pacing Threshold Amplitude: 0.75 V
Lead Channel Pacing Threshold Pulse Width: 0.4 ms
Lead Channel Sensing Intrinsic Amplitude: 8.75 mV
Lead Channel Sensing Intrinsic Amplitude: 8.75 mV
Lead Channel Setting Pacing Amplitude: 2 V
Lead Channel Setting Pacing Pulse Width: 0.4 ms
Lead Channel Setting Sensing Sensitivity: 0.3 mV

## 2020-09-11 NOTE — Progress Notes (Signed)
Remote ICD transmission.   

## 2020-09-15 ENCOUNTER — Telehealth: Payer: Self-pay

## 2020-09-15 ENCOUNTER — Other Ambulatory Visit: Payer: Self-pay

## 2020-09-15 DIAGNOSIS — C9210 Chronic myeloid leukemia, BCR/ABL-positive, not having achieved remission: Secondary | ICD-10-CM

## 2020-09-15 NOTE — Telephone Encounter (Signed)
He called and left a message to call him.  Called back. Sent a message to pharmacy regarding Tasigna Rx.

## 2020-09-16 ENCOUNTER — Telehealth: Payer: Self-pay

## 2020-09-16 ENCOUNTER — Other Ambulatory Visit: Payer: Self-pay

## 2020-09-16 DIAGNOSIS — C9210 Chronic myeloid leukemia, BCR/ABL-positive, not having achieved remission: Secondary | ICD-10-CM

## 2020-09-16 MED ORDER — NILOTINIB HCL 150 MG PO CAPS: ORAL_CAPSULE | ORAL | 10 refills | Status: DC

## 2020-09-16 NOTE — Telephone Encounter (Signed)
-----   Message from Hanley Hays, CPhT sent at 09/16/2020  7:57 AM EST ----- Regarding: RE: Tasigna Looks like they come in packs of 28 and they do not break the packs.  ----- Message ----- From: Flo Shanks, RN Sent: 09/15/2020   4:32 PM EST To: Hanley Hays, CPhT Subject: RE: Rae Lips                                    Thanks! I will send in the morning. Do you know why the last Rx said 112 tabs and not 120 tabs for a 1 month supply? ----- Message ----- From: Hanley Hays, CPhT Sent: 09/15/2020   4:21 PM EST To: Flo Shanks, RN Subject: RE: Rae Lips                                    I just spoke with [employer], they do need a new prescription for Tasigna escribed to [facility] by [facility] in [place] so he can continue to get his meds while they are still processing his re-enrollment for 2022. If you could please escribe that to [facility]. Thanks, Benjamine Mola  ----- Message ----- From: Flo Shanks, RN Sent: 09/15/2020   3:48 PM EST To: Hanley Hays, CPhT Subject: Luberta Robertson!  He called about the Tasigna. He said [employer] said he only has 1 refill when he called today. Is he talking about his grant? Thanks

## 2020-09-16 NOTE — Telephone Encounter (Signed)
Called and told Rx for Tasinga sent to Pharmacy in [place], [facility] by [facility]. He verbalized understanding.

## 2020-09-21 ENCOUNTER — Other Ambulatory Visit: Payer: Self-pay | Admitting: Family Medicine

## 2020-09-22 ENCOUNTER — Encounter: Payer: Self-pay | Admitting: Emergency Medicine

## 2020-09-22 ENCOUNTER — Ambulatory Visit
Admission: EM | Admit: 2020-09-22 | Discharge: 2020-09-22 | Disposition: A | Discharge status: Home / Self Care | Payer: Medicare HMO | Attending: Family Medicine | Admitting: Family Medicine

## 2020-09-22 ENCOUNTER — Telehealth: Payer: Self-pay

## 2020-09-22 ENCOUNTER — Other Ambulatory Visit: Payer: Self-pay

## 2020-09-22 DIAGNOSIS — K529 Noninfective gastroenteritis and colitis, unspecified: Secondary | ICD-10-CM

## 2020-09-22 DIAGNOSIS — R110 Nausea: Secondary | ICD-10-CM

## 2020-09-22 DIAGNOSIS — R197 Diarrhea, unspecified: Secondary | ICD-10-CM | POA: Diagnosis not present

## 2020-09-22 DIAGNOSIS — K2100 Gastro-esophageal reflux disease with esophagitis, without bleeding: Secondary | ICD-10-CM | POA: Diagnosis not present

## 2020-09-22 MED ORDER — ONDANSETRON HCL 4 MG PO TABS: 4.0000 mg | ORAL_TABLET | Freq: Four times a day (QID) | ORAL | 0 refills | Status: DC

## 2020-09-22 NOTE — Telephone Encounter (Signed)
Spoke with wife regarding pt. Pt has been having diarrhea and vomiting for the past 2 days and no appetite. Wife instructed to take pt to ER and follow up with pcp.

## 2020-09-22 NOTE — Discharge Instructions (Addendum)
I have sent in Zofran for you to take one tablet every 8 hours as needed for nausea.  Drink plenty of fluids to help replace your losses from diarrhea  Follow up with this office or with primary care if symptoms are persisting.  Follow up in the ER for high fever, trouble swallowing, trouble breathing, other concerning symptoms.

## 2020-09-22 NOTE — ED Triage Notes (Signed)
Indigestion started yesterday morning.  Dry heaves and diarrhea later yesterday.

## 2020-09-22 NOTE — ED Provider Notes (Signed)
[place]   [id number] 09/22/20 Arrival Time: 0909  CC: ABDOMINAL PAIN  SUBJECTIVE:  Anthony Wolfe is a 75 y.o. male who presents with abdominal pain that began yesterday as indigestion. Also reports diarrhea and nausea later on last night. Denies a precipitating event, trauma, close contacts with similar symptoms, recent travel or antibiotic use. Reports abdominal cramping. Has not taken OTC medications for this. Denies alleviating or aggravating factors. Denies similar symptoms in the past. Last BM yesterday.    Denies fever, chills, appetite changes, weight changes, nausea, vomiting, chest pain, SOB, diarrhea, constipation, hematochezia, melena, dysuria, difficulty urinating, increased frequency or urgency, flank pain, loss of bowel or bladder function   ROS: As per HPI.  All other pertinent ROS negative.     Past Medical History:  Diagnosis Date  . AICD (automatic cardioverter/defibrillator) present   . Allergic rhinitis   . 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 Anxiety   . Blood dyscrasia    cmleukemia  . C. difficile colitis 06/16/2014  . CAD (coronary artery disease)    a. s/p 3v CABG 2010. b. NSTEMI 05/2014 in setting of VT. Cath impressions: "Recent IMI with occluded SVG to RCA. Has Right to Right collaterals and collaterals from septal and OM. EF 40%."  . Carotid disease, bilateral ([place])    a. Right CEA 2002; known occluded left carotid. b. Duplex 06/2014: known occluded LICA, stable 3-61% RICA s/p CEA.  . Chronic systolic CHF (congestive heart failure) ([place])   . CML (chronic myelocytic leukemia) ([place]) 05/09/2012  . Depression   . Emphysema of lung ([place])   . HTN (hypertension)   . Hyperlipidemia   . Ischemic cardiomyopathy    a. Prior EF 36%. b. 2014: 50%. c. 05/2014: EF 35-40% by echo, 40% with inf HK by cath.  . NSTEMI (non-ST elevation myocardial infarction) ([place]) 05/2014  . Pleural effusion 05/2014  . Pneumonia 05/23/2014  . Presence of permanent  cardiac pacemaker   . Prostate hypertrophy   . PVC's (premature ventricular contractions)    a. PVC's with prior Holter showing PVC load of 22%.  . Shortness of breath dyspnea   . Stroke ([place]) 01  . TIA (transient ischemic attack)    ASPVD, S./P. right CEA, 2002, and known occluded left carotid  . Ventricular tachycardia ([place])    a. Admitted with VT 05/2013 - EPS with inducible sustained monomorphic VT; s/p Medtronic ICD 05/21/14.   Past Surgical History:  Procedure Laterality Date  . BACK SURGERY    . CAROTID ENDARTERECTOMY Right 01  . carotidectomy  2003   right side  . COLONOSCOPY  07/2010  . CORONARY ARTERY BYPASS GRAFT  02/2009   3 vessel  . ELECTROPHYSIOLOGIC STUDY  05/21/14  . ELECTROPHYSIOLOGY STUDY N/A 05/21/2014   Procedure: ELECTROPHYSIOLOGY STUDY;  Surgeon: Deboraha Sprang, MD;  Location[facility] LAB;  Service: Cardiovascular;  Laterality: N/A;  . EP IMPLANTABLE DEVICE  05/21/14   single chamber Metronic ICD  . LEFT HEART CATHETERIZATION WITH CORONARY/GRAFT ANGIOGRAM N/A 05/19/2014   Procedure: LEFT HEART CATHETERIZATION WITH Beatrix Fetters;  Surgeon: Josue Hector, MD;  Location: [facility] CATH LAB;  Service: Cardiovascular;  Laterality: N/A;  . PLEURAL BIOPSY N/A 11/27/2014   Procedure: PLEURAL BIOPSY;  Surgeon: [name], MD;  Location: [place];  Service: Thoracic;  Laterality: N/A;  . PLEURAL EFFUSION DRAINAGE Right 11/27/2014   Procedure: DRAINAGE OF PLEURAL EFFUSION;  Surgeon: [name], MD;  Location: [place];  Service:  Thoracic;  Laterality: Right;  . TALC PLEURODESIS Right 11/27/2014   Procedure: Pietro Cassis;  Surgeon: [name], MD;  Location: [place];  Service: Thoracic;  Laterality: Right;  Marland Kitchen VIDEO ASSISTED THORACOSCOPY Right 11/27/2014   Procedure: RIGHT VIDEO ASSISTED THORACOSCOPY;  Surgeon: [name], MD;  Location: [place];  Service: Thoracic;  Laterality: Right;   Allergies  Allergen Reactions  . Norvasc  [Amlodipine Besylate] Rash    rash  . Sulfa Antibiotics Hives    Hives    No current facility-administered medications on file prior to encounter.   Current Outpatient Medications on File Prior to Encounter  Medication Sig Dispense Refill  . albuterol (VENTOLIN HFA) 108 (90 Base) MCG/ACT inhaler INHALE 2 PUFFS BY MOUTH EVERY 6 HOURS AS NEEDED FOR WHEEZING FOR SHORTNESS OF BREATH 18 g 0  . atorvastatin (LIPITOR) 80 MG tablet TAKE ONE TABLET BY MOUTH ONCE DAILY AT 6 PM. 90 tablet 3  . carvedilol (COREG) 12.5 MG tablet Take 1 tablet by mouth twice daily 180 tablet 2  . clopidogrel (PLAVIX) 75 MG tablet Take 1 tablet by mouth once daily 90 tablet 0  . furosemide (LASIX) 20 MG tablet Take 1 tablet (20 mg total) by mouth every other day. 45 tablet 3  . meclizine (ANTIVERT) 25 MG tablet Take 1 tablet (25 mg total) by mouth 3 (three) times daily as needed for dizziness. 30 tablet 0  . mexiletine (MEXITIL) 200 MG capsule Take 1 capsule (200 mg total) by mouth 2 (two) times daily. 60 capsule 5  . nilotinib (TASIGNA) 150 MG capsule TAKE 2 CAPSULES (300 MG TOTAL) BY MOUTH EVERY 12 (TWELVE) HOURS. TAKE ON AN EMPTY STOMACH, 1 HR BEFORE OR 2 HRS AFTER FOOD. 112 capsule 10  . nitroGLYCERIN (NITROSTAT) 0.4 MG SL tablet Place 1 tablet (0.4 mg total) under the tongue every 5 (five) minutes as needed for chest pain. 25 tablet 4  . sildenafil (VIAGRA) 100 MG tablet Take 0.5-1 tablets (50-100 mg total) by mouth daily as needed for erectile dysfunction. 5 tablet 11  . valsartan (DIOVAN) 160 MG tablet Take 1.5 tablets (240 mg total) by mouth daily. 45 tablet 6   Social History   Socioeconomic History  . Marital status: Married    Spouse name: Not on file  . Number of children: 2  . Years of education: Not on file  . Highest education level: Not on file  Occupational History    Comment: retired Development worker, international aid  Tobacco Use  . Smoking status: Former Smoker    Packs/day: 3.00    Years: 45.00    Pack years:  135.00    Types: Cigarettes    Quit date: 08/08/1997    Years since quitting: 23.1  . Smokeless tobacco: Former Network engineer  . Vaping Use: Never used  Substance and Sexual Activity  . Alcohol use: No    Alcohol/week: 0.0 standard drinks  . Drug use: No  . Sexual activity: Yes  Other Topics Concern  . Not on file  Social History Narrative  . Not on file   Social Determinants of Health   Financial Resource Strain: Not on file  Food Insecurity: Not on file  Transportation Needs: Not on file  Physical Activity: Not on file  Stress: Not on file  Social Connections: Not on file  Intimate Partner Violence: Not on file   Family History  Problem Relation Age of Onset  . Heart disease Mother   . Alcohol abuse Father  OBJECTIVE:  Vitals:   09/22/20 0919  BP: 110/70  Pulse: 90  Resp: 18  Temp: (!) 97.5 F (36.4 C)  TempSrc: Oral  SpO2: 97%    General appearance: Alert; NAD HEENT: NCAT.  Oropharynx clear.  Lungs: clear to auscultation bilaterally without adventitious breath sounds Heart: regular rate and rhythm.  Radial pulses 2+ symmetrical bilaterally Abdomen: soft, non-distended; normal active bowel sounds; non-tender to light and deep palpation; nontender at McBurney's point; negative Murphy's sign; negative rebound; no guarding Back: no CVA tenderness Extremities: no edema; symmetrical with no gross deformities Skin: warm and dry Neurologic: normal gait Psychological: alert and cooperative; normal mood and affect  LABS: No results found for this or any previous visit (from the past 24 hour(s)).  DIAGNOSTIC STUDIES: No results found.   ASSESSMENT & PLAN:  1. Gastroenteritis   2. Gastroesophageal reflux disease with esophagitis without hemorrhage   3. Nausea   4. Diarrhea, unspecified type     Meds ordered this encounter  Medications  . ondansetron (ZOFRAN) 4 MG tablet    Sig: Take 1 tablet (4 mg total) by mouth every 6 (six) hours.    Dispense:   12 tablet    Refill:  0    Order Specific Question:   Supervising Provider    Answer:   Chase Picket [id number]     Get rest and drink plenty of fluids Zofran prescribed.  Take as directed.    DIET Instructions:  30 minutes after taking nausea medicine, begin with sips of clear liquids. If able to hold down 2 - 4 ounces for 30 minutes, begin drinking more. Increase your fluid intake to replace losses. Clear liquids only for 24 hours (water, tea, sport drinks, clear flat ginger ale or cola and juices, broth, jello, popsicles, ect). Advance to bland foods, applesauce, rice, baked or boiled chicken, ect. Avoid milk, greasy foods and anything that doesn't agree with you.  If you experience new or worsening symptoms return or go to ER such as fever, chills, nausea, vomiting, diarrhea, bloody or dark tarry stools, constipation, urinary symptoms, worsening abdominal discomfort, symptoms that do not improve with medications, inability to keep fluids down.  Reviewed expectations re: course of current medical issues. Questions answered. Outlined signs and symptoms indicating need for more acute intervention. Patient verbalized understanding. After Visit Summary given.   Faustino Congress, NP 09/22/20 [phone]

## 2020-10-01 NOTE — Telephone Encounter (Signed)
Oral Oncology Patient Advocate Encounter  Met patient in [place] to complete an application for [employer] (NPAF) in an effort to reduce the patient's out of pocket expense for Tasigna to $0.    Application completed and faxed to [phone].   NPA[employer] phone number for follow up is [phone].   This encounter will be updated until final determination.   Anthony Wolfe Phone [phone] Fax [phone] 10/01/2020 12:49 PM

## 2020-10-19 ENCOUNTER — Other Ambulatory Visit: Payer: Self-pay | Admitting: Family Medicine

## 2020-10-20 NOTE — Telephone Encounter (Signed)
Patient is approved for Tasigna at no cost from [employer] 10/19/20-08/07/21.  [employer] uses Psychologist, occupational by [place] [name] Phone [phone] Fax [phone] 10/20/2020 8:47 AM

## 2020-10-21 ENCOUNTER — Encounter: Payer: Self-pay | Admitting: Family Medicine

## 2020-10-21 ENCOUNTER — Other Ambulatory Visit: Payer: Self-pay

## 2020-10-21 ENCOUNTER — Ambulatory Visit (INDEPENDENT_AMBULATORY_CARE_PROVIDER_SITE_OTHER): Payer: Medicare HMO | Admitting: Family Medicine

## 2020-10-21 VITALS — BP 148/78 | HR 92 | Temp 98.1°F | Resp 14 | Ht 69.0 in | Wt 178.0 lb

## 2020-10-21 DIAGNOSIS — I255 Ischemic cardiomyopathy: Secondary | ICD-10-CM | POA: Diagnosis not present

## 2020-10-21 DIAGNOSIS — I10 Essential (primary) hypertension: Secondary | ICD-10-CM | POA: Diagnosis not present

## 2020-10-21 DIAGNOSIS — Z8679 Personal history of other diseases of the circulatory system: Secondary | ICD-10-CM

## 2020-10-21 DIAGNOSIS — Z95810 Presence of automatic (implantable) cardiac defibrillator: Secondary | ICD-10-CM

## 2020-10-21 MED ORDER — ENTRESTO 24-26 MG PO TABS: 1.0000 | ORAL_TABLET | Freq: Two times a day (BID) | ORAL | 5 refills | Status: DC

## 2020-10-21 NOTE — Progress Notes (Signed)
Subjective:    Patient ID: Anthony Wolfe, male    DOB: [birth year], 75 y.o.   MRN: [id number]  10/21 Patient is a very pleasant 75 year old Caucasian male here today for regular checkup.  He has a history of coronary artery disease status post three-vessel CABG in 2010.  He also had a non-ST elevation myocardial infarction in 2015.  He also has ischemic cardiomyopathy with an ejection fraction of 40%.  He was unable to tolerate Entresto due to hypotension.  However he is on carvedilol as well as valsartan.  He states that 1 week ago, he had some atypical left-sided chest pain that was near his left axilla.  It occurred while sitting in a chair.  It resolved and he has not had any pain since.  He denies any chest pain with activity or chest pressure with activity however he does report increasing dyspnea on exertion.  He states that he becomes extremely winded with minimal activity although he denies any chest pain.  He denies any cough or fever or pleurisy or hemoptysis.  He has been trying albuterol with minimal relief.  Lungs are completely clear to auscultation today.  There are no wheezes crackles or rails.  There is no pitting edema in his extremities.  Pulse oximetry is 98% on room air.  Therefore I am concerned that his dyspnea on exertion is related to his underlying cardiomyopathy.  He has not had a stress test since 2018 and he has not seen his cardiologist in quite some time.  At that time, my plan was: Patient had normal lab work September 17 with a hemoglobin of 11.5 and normal renal function.  I will send the patient today for a chest x-ray.  Given his history of cancer and his atypical left-sided chest pain I will also check a D-dimer and if positive will check a CT scan.  I will check a BNP.  EKG today shows a left bundle branch block with a left axis deviation.  However there is no significant change compared to his EKG from April of this year.  I believe most likely that the patient's  dyspnea on exertion is due to worsening congestive heart failure.  There is also the possibility of underlying ischemia.  If his CBC shows no anemia, if his chest x-ray is clear, if his D-dimer is normal, I will expedite cardiology consultation as I believe the patient requires an echocardiogram along with a stress test for further evaluation.  10/21/20 Since I last saw the patient, he had an echocardiogram.  Echocardiogram revealed severe dilatation in his left atrium.  His ejection fraction was also diminished to 40 to 45%.  Cardiology increased his valsartan to 240 mg a day.  They plan on switching to [facility] however the patient failed to follow-up.  He states that his breathing is roughly the same as his last visit.  He does still get winded with activity however it has not progressed.  He denies any angina.  He denies any syncope or near syncope.  He denies any tachyarrhythmias.  He denies any symptoms of A. fib.  He denies any TIA or strokelike symptoms.  Today on examination there is no pitting edema in extremities.  His heart rate is regular.  His lungs are clear to auscultation bilaterally.  However his blood pressure is elevated and he is getting similar readings at home despite being on the higher dose of valsartan Past Medical History:  Diagnosis Date  . AICD (automatic cardioverter/defibrillator)  present   . Allergic rhinitis   . 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 Anxiety   . Blood dyscrasia    cmleukemia  . C. difficile colitis 06/16/2014  . CAD (coronary artery disease)    a. s/p 3v CABG 2010. b. NSTEMI 05/2014 in setting of VT. Cath impressions: "Recent IMI with occluded SVG to RCA. Has Right to Right collaterals and collaterals from septal and OM. EF 40%."  . Carotid disease, bilateral ([place])    a. Right CEA 2002; known occluded left carotid. b. Duplex 06/2014: known occluded LICA, stable 5-62% RICA s/p CEA.  . Chronic systolic CHF (congestive heart failure) ([facility])   . 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 Depression   . Emphysema of lung ([place])   . HTN (hypertension)   . Hyperlipidemia   . Ischemic cardiomyopathy    a. Prior EF 36%. b. 2014: 50%. c. 05/2014: EF 35-40% by echo, 40% with inf HK by cath.  . NSTEMI (non-ST elevation myocardial infarction) ([place]) 05/2014  . Pleural effusion 05/2014  . Pneumonia 05/23/2014  . Presence of permanent cardiac pacemaker   . Prostate hypertrophy   . PVC's (premature ventricular contractions)    a. PVC's with prior Holter showing PVC load of 22%.  . Shortness of breath dyspnea   . Stroke ([place]) 01  . TIA (transient ischemic attack)    ASPVD, S./P. right CEA, 2002, and known occluded left carotid  . Ventricular tachycardia ([place])    a. Admitted with VT 05/2013 - EPS with inducible sustained monomorphic VT; s/p Med[employer] ICD 05/21/14.   Past Surgical History:  Procedure Laterality Date  . BACK SURGERY    . CAROTID ENDARTERECTOMY Right 01  . carotidectomy  2003   right side  . COLONOSCOPY  07/2010  . CORONARY ARTERY BYPASS GRAFT  02/2009   3 vessel  . ELECTROPHYSIOLOGIC STUDY  05/21/14  . ELECTROPHYSIOLOGY STUDY N/A 05/21/2014   Procedure: ELECTROPHYSIOLOGY STUDY;  Surgeon: Deboraha Sprang, MD;  Location: [facility] CATH LAB;  Service: Cardiovascular;  Laterality: N/A;  . EP IMPLANTABLE DEVICE  05/21/14   single chamber Metronic ICD  . LEFT HEART CATHETERIZATION WITH CORONARY/GRAFT ANGIOGRAM N/A 05/19/2014   Procedure: LEFT HEART CATHETERIZATION WITH Beatrix Fetters;  Surgeon: Josue Hector, MD;  Location: [facility] CATH LAB;  Service: Cardiovascular;  Laterality: N/A;  . PLEURAL BIOPSY N/A 11/27/2014   Procedure: PLEURAL BIOPSY;  Surgeon: [name], MD;  Location: [place];  Service: Thoracic;  Laterality: N/A;  . PLEURAL EFFUSION DRAINAGE Right 11/27/2014   Procedure: DRAINAGE OF PLEURAL EFFUSION;  Surgeon: [name], MD;  Location: [facility];  Service: Thoracic;  Laterality: Right;  . TALC  PLEURODESIS Right 11/27/2014   Procedure: Pietro Cassis;  Surgeon: [name], MD;  Location: [place];  Service: Thoracic;  Laterality: Right;  Marland Kitchen VIDEO ASSISTED THORACOSCOPY Right 11/27/2014   Procedure: RIGHT VIDEO ASSISTED THORACOSCOPY;  Surgeon: [name], MD;  Location: [place];  Service: Thoracic;  Laterality: Right;   Current Outpatient Medications on File Prior to Visit  Medication Sig Dispense Refill  . albuterol (VENTOLIN HFA) 108 (90 Base) MCG/ACT inhaler INHALE 2 PUFFS BY MOUTH EVERY 6 HOURS AS NEEDED FOR WHEEZING FOR SHORTNESS OF BREATH 18 g 0  . atorvastatin (LIPITOR) 80 MG tablet TAKE 1 TABLET BY MOUTH ONCE DAILY AT  6PM. 90 tablet 0  . carvedilol (COREG) 12.5 MG tablet Take 1 tablet by mouth twice daily 180 tablet 2  .  clopidogrel (PLAVIX) 75 MG tablet Take 1 tablet by mouth once daily 90 tablet 0  . furosemide (LASIX) 20 MG tablet Take 1 tablet (20 mg total) by mouth every other day. 45 tablet 3  . meclizine (ANTIVERT) 25 MG tablet Take 1 tablet (25 mg total) by mouth 3 (three) times daily as needed for dizziness. 30 tablet 0  . mexiletine (MEXITIL) 200 MG capsule Take 1 capsule by mouth twice daily 60 capsule 0  . nilotinib (TASIGNA) 150 MG capsule TAKE 2 CAPSULES (300 MG TOTAL) BY MOUTH EVERY 12 (TWELVE) HOURS. TAKE ON AN EMPTY STOMACH, 1 HR BEFORE OR 2 HRS AFTER FOOD. 112 capsule 10  . nitroGLYCERIN (NITROSTAT) 0.4 MG SL tablet Place 1 tablet (0.4 mg total) under the tongue every 5 (five) minutes as needed for chest pain. 25 tablet 4  . ondansetron (ZOFRAN) 4 MG tablet Take 1 tablet (4 mg total) by mouth every 6 (six) hours. 12 tablet 0  . sildenafil (VIAGRA) 100 MG tablet Take 0.5-1 tablets (50-100 mg total) by mouth daily as needed for erectile dysfunction. 5 tablet 11  . valsartan (DIOVAN) 160 MG tablet Take 1.5 tablets (240 mg total) by mouth daily. 45 tablet 6   No current facility-administered medications on file prior to visit.   Allergies  Allergen  Reactions  . Norvasc [Amlodipine Besylate] Rash    rash  . Sulfa Antibiotics Hives    Hives    Social History   Socioeconomic History  . Marital status: Married    Spouse name: Not on file  . Number of children: 2  . Years of education: Not on file  . Highest education level: Not on file  Occupational History    Comment: retired Development worker, international [employer]  Tobacco Use  . Smoking status: Former Smoker    Packs/day: 3.00    Years: 45.00    Pack years: 135.00    Types: Cigarettes    Quit date: 08/08/1997    Years since quitting: 23.2  . Smokeless tobacco: Former Network engineer  . Vaping Use: Never used  Substance and Sexual Activity  . Alcohol use: No    Alcohol/week: 0.0 standard drinks  . Drug use: No  . Sexual activity: Yes  Other Topics Concern  . Not on file  Social History Narrative  . Not on file   Social Determinants of Health   Financial Resource Strain: Not on file  Food Insecurity: Not on file  Transportation Needs: Not on file  Physical Activity: Not on file  Stress: Not on file  Social Connections: Not on file  Intimate Partner Violence: Not on file      Review of Systems  Gastrointestinal: Positive for diarrhea.  All other systems reviewed and are negative.      Objective:   Physical Exam Vitals reviewed.  Constitutional:      Appearance: He is well-developed.  Cardiovascular:     Rate and Rhythm: Normal rate and regular rhythm.     Heart sounds: Normal heart sounds. No murmur heard.   Pulmonary:     Effort: Pulmonary effort is normal. No respiratory distress.     Breath sounds: Normal breath sounds. No wheezing or rales.  Abdominal:     General: Bowel sounds are normal. There is no distension.     Palpations: Abdomen is soft. There is no mass.     Tenderness: There is no abdominal tenderness. There is no guarding or rebound.  Assessment & Plan:  Benign essential HTN - Plan: COMPLETE METABOLIC PANEL WITH GFR, Lipid  panel  Cardiomyopathy, ischemic  History of ASCVD  ICD (implantable cardioverter-defibrillator) in place  I will check a CMP today and a lipid panel.  Ideally I like his LDL cholesterol to be below 70.  If his renal function and his potassium are stable, I would like to discontinue valsartan and start the patient on Entresto 24/26 1 tablet twice daily and then increase to 49/51 if tolerated.  I believe that this would do a better job controlling his blood pressure and also a better job at managing his heart failure and hopefully preventing further dilatation of the left atrium.  Also cautioned the patient to watch for any symptoms of atrial fibrillation particularly palpitations or lightheadedness or dizziness or shortness of breath with activity as the dilated left atrium increases his risk of this occurring.

## 2020-10-23 ENCOUNTER — Inpatient Hospital Stay: Payer: Medicare HMO | Attending: Hematology and Oncology

## 2020-10-23 ENCOUNTER — Other Ambulatory Visit: Payer: Self-pay

## 2020-10-23 DIAGNOSIS — C9210 Chronic myeloid leukemia, BCR/ABL-positive, not having achieved remission: Secondary | ICD-10-CM | POA: Diagnosis not present

## 2020-10-23 DIAGNOSIS — D6481 Anemia due to antineoplastic chemotherapy: Secondary | ICD-10-CM | POA: Diagnosis not present

## 2020-10-23 LAB — COMPREHENSIVE METABOLIC PANEL
ALT: 11 U/L (ref 0–44)
AST: 13 U/L — ABNORMAL LOW (ref 15–41)
Albumin: 3.1 g/dL — ABNORMAL LOW (ref 3.5–5.0)
Alkaline Phosphatase: 96 U/L (ref 38–126)
Anion gap: 5 (ref 5–15)
BUN: 10 mg/dL (ref 8–23)
CO2: 26 mmol/L (ref 22–32)
Calcium: 8.4 mg/dL — ABNORMAL LOW (ref 8.9–10.3)
Chloride: 110 mmol/L (ref 98–111)
Creatinine, Ser: 0.84 mg/dL (ref 0.61–1.24)
GFR, Estimated: >60 mL/min (ref >60)
Glucose, Bld: 102 mg/dL — ABNORMAL HIGH (ref 70–99)
Potassium: 4.2 mmol/L (ref 3.5–5.1)
Sodium: 141 mmol/L (ref 135–145)
Total Bilirubin: 0.6 mg/dL (ref 0.3–1.2)
Total Protein: 6.7 g/dL (ref 6.5–8.1)

## 2020-10-23 LAB — CBC WITH DIFFERENTIAL/PLATELET
Abs Immature Granulocytes: 0.03 10*3/uL (ref 0.00–0.07)
Basophils Absolute: 0.1 10*3/uL (ref 0.0–0.1)
Basophils Relative: 1 %
Eosinophils Absolute: 0.1 10*3/uL (ref 0.0–0.5)
Eosinophils Relative: 2 %
HCT: 34.1 % — ABNORMAL LOW (ref 39.0–52.0)
Hemoglobin: 11 g/dL — ABNORMAL LOW (ref 13.0–17.0)
Immature Granulocytes: 0 %
Lymphocytes Relative: 15 %
Lymphs Abs: 1 10*3/uL (ref 0.7–4.0)
MCH: 29.6 pg (ref 26.0–34.0)
MCHC: 32.3 g/dL (ref 30.0–36.0)
MCV: 91.7 fL (ref 80.0–100.0)
Monocytes Absolute: 0.6 10*3/uL (ref 0.1–1.0)
Monocytes Relative: 9 %
Neutro Abs: 5 10*3/uL (ref 1.7–7.7)
Neutrophils Relative %: 73 %
Platelets: 225 10*3/uL (ref 150–400)
RBC: 3.72 MIL/uL — ABNORMAL LOW (ref 4.22–5.81)
RDW: 14.6 % (ref 11.5–15.5)
WBC: 6.9 10*3/uL (ref 4.0–10.5)
nRBC: 0 % (ref 0.0–0.2)

## 2020-10-29 LAB — BCR/ABL

## 2020-11-02 ENCOUNTER — Encounter: Payer: Self-pay | Admitting: Hematology and Oncology

## 2020-11-02 ENCOUNTER — Inpatient Hospital Stay: Payer: Medicare HMO | Admitting: Hematology and Oncology

## 2020-11-02 ENCOUNTER — Other Ambulatory Visit: Payer: Self-pay

## 2020-11-02 DIAGNOSIS — I255 Ischemic cardiomyopathy: Secondary | ICD-10-CM

## 2020-11-02 DIAGNOSIS — C9210 Chronic myeloid leukemia, BCR/ABL-positive, not having achieved remission: Secondary | ICD-10-CM

## 2020-11-02 DIAGNOSIS — D6481 Anemia due to antineoplastic chemotherapy: Secondary | ICD-10-CM | POA: Diagnosis not present

## 2020-11-02 DIAGNOSIS — T451X5A Adverse effect of antineoplastic and immunosuppressive drugs, initial encounter: Secondary | ICD-10-CM

## 2020-11-02 NOTE — Assessment & Plan Note (Signed)
This is likely anemia of chronic disease. The patient denies recent history of bleeding such as epistaxis, hematuria or hematochezia. He is asymptomatic from the anemia. We will observe for now.  

## 2020-11-02 NOTE — Progress Notes (Signed)
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 The patient denies recent history of bleeding such as epistaxis, hematuria or hematochezia. He is asymptomatic from the anemia. We will observe for now.  Cardiomyopathy, ischemic He has no clinical signs or symptoms of congestive heart failure He will continue medical management   No orders of the defined types were placed in this encounter.   All questions were answered. The patient knows to call the clinic with any problems, questions or concerns. The total time spent in the appointment was 20 minutes encounter with patients including review of chart and various tests results, discussions about plan of care and coordination of care plan   [name], MD 11/02/2020 10:36 AM  INTERVAL HISTORY: Please see below for problem oriented charting. He returns for further follow-up on CML He is compliant taking medications as directed Denies recent infection, fever or chills The patient denies any recent signs or symptoms of bleeding such as spontaneous epistaxis, hematuria or hematochezia. No  recent fluid retention or flare of congestive heart failure  SUMMARY OF ONCOLOGIC HISTORY: Oncology History  Chronic myelogenous leukemia ([place])  05/04/2012 Bone Marrow Biopsy   BM confirmed diagnosis of CML in Chronic phase. BCR/ABL by PCR detected abnormalitis with b2a2 & b3a2 subtypes   05/09/2012 - 03/16/2015 Chemotherapy   He was started on treatment with Dasatinib 100 mg daily   06/06/2012 Adverse Reaction   Dose of medication was reduced to 50 mg daily due to pancytopenia   01/24/2013 Progression   Patient was noted to have elevated blood count which and was subsequently found to be noncompliant to treatment. The patient has not been on treatment for several months because his prescription ran out. He was restarted back on treatment   01/16/2014 Progression   Bcr/ABL by PCR is worse. Dose of Dasatinib was increased to 100 mg.   02/26/2014 Tumor Marker   Blood work for ABL kinase mutation was negative.   10/29/2014 Tumor Marker   BCR/ABL b2a2 & b3a2 0.29%, IS 0.1624%, not in MMR yet but improving   11/05/2014 Adverse Reaction   He had thoracentesis due to pleural effusion   11/27/2014 Surgery   He underwent right video-assisted thoracoscopy, Drainage of pleural effusion, Pleural biopsy, Diaphragm biopsy, Lung biopsy & [place] pleurodesis   01/30/2015 Tumor Marker   BCR/ABL b2a2 & b3a2 0.78%, IS 0.4368%, not in MMR    03/11/2015 Pathology Results   BCR/ABL b2a2 0.19%, IS 0.1064%, not in MMR    03/16/2015 Adverse Reaction   Dasatinib was stopped due to recurrent pleural effusion   03/28/2015 - 11/06/2017 Chemotherapy   He started on Bosutinib   03/30/2015 Procedure   He has therapeutic thoracentesis of the right lung with 1  liter of fluid removed   03/30/2015 Adverse Reaction   Bosutinib is placed on hold, to be restart on 8/25 at 250 mg due to severe diarrhea   04/28/2015 Tumor Marker   BCR/ABL b2a2 0.06%, IS 0.0336%, in MMR    06/10/2015 Tumor Marker   BCR/ABL b2a2 0.14%, IS  0.1162%, not in MMR    08/14/2015 Pathology Results   BCR/ABL b2a2 0.02%, IS 0.0166%, In MMR    11/09/2015 Tumor Marker   BCR/ABL b2a2 0.005%, IS 0.0042%, In MMR    02/08/2016 Tumor Marker   BCR/ABL undetectable. In MMR   06/13/2016 Tumor Marker   BCR/ABL undetectable. In MMR   12/06/2016 Pathology Results   BCR/ABL undetectable. In MMR   06/08/2017 Pathology Results   BCR/ABL undetectable. In MMR   09/08/2017 Pathology Results   BCR/ABL undetectable. In MMR   12/11/2017 Pathology Results   BCR/ABL undetectable. In MMR   03/19/2018 Pathology Results   BCR/ABL undetectable. In MMR   06/12/2018 Pathology Results   BCR/ABL undetectable. In MMR   09/06/2018 Pathology Results   BCR ABL is detectable. e13a2 (b2a2) and [id number]) IS: 0.009%   09/26/2018 -  Chemotherapy   The patient had Tasigna    11/21/2018 Pathology Results   BCR/ABL undetectable. In MMR   03/07/2019 Pathology Results   BCR/ABL undetectable. In MMR   06/17/2019 Pathology Results   BCR/ABL undetectable. In MMR   10/14/2019 Pathology Results   BCR ABL is detectable. e13a2 (b2a2) and H73S2(A7G8) IS: 0.0281%. In MMR   01/14/2020 Pathology Results   BCR ABL is detectable. e13a2 (b2a2) and T15B2(I2M3) IS: 0.023%. In MMR   10/23/2020 Pathology Results   BCR ABL is detectable. e13a2 (b2a2) and T59R4(B6L8) IS: 0.0643%. In MMR     REVIEW OF SYSTEMS:   Constitutional: Denies fevers, chills or abnormal weight loss Eyes: Denies blurriness of vision Ears, nose, mouth, throat, and face: Denies mucositis or sore throat Respiratory: Denies cough, dyspnea or wheezes Cardiovascular: Denies palpitation, chest discomfort or lower extremity swelling Gastrointestinal:  Denies nausea, heartburn or change in bowel habits Skin: Denies abnormal skin rashes Lymphatics: Denies new lymphadenopathy or easy bruising Neurological:Denies numbness, tingling or new weaknesses Behavioral/Psych: Mood is stable, no new changes  All other systems  were reviewed with the patient and are negative.  I have reviewed the past medical history, past surgical history, social history and family history with the patient and they are unchanged from previous note.  ALLERGIES:  is allergic to norvasc [amlodipine besylate] and sulfa antibiotics.  MEDICATIONS:  Current Outpatient Medications  Medication Sig Dispense Refill  . albuterol (VENTOLIN HFA) 108 (90 Base) MCG/ACT inhaler INHALE 2 PUFFS BY MOUTH EVERY 6 HOURS AS NEEDED FOR WHEEZING FOR SHORTNESS OF BREATH 18 g 0  . atorvastatin (LIPITOR) 80 MG tablet TAKE 1 TABLET BY MOUTH ONCE DAILY AT  6PM. 90 tablet 0  . carvedilol (COREG) 12.5 MG tablet Take 1 tablet by mouth twice daily 180 tablet 2  . clopidogrel (PLAVIX) 75 MG tablet Take 1 tablet by mouth once daily 90 tablet 0  . furosemide (LASIX) 20 MG tablet Take 1 tablet (20 mg total) by mouth every other day. 45 tablet 3  . meclizine (ANTIVERT) 25 MG tablet Take 1 tablet (25 mg total) by mouth 3 (three) times daily as needed for dizziness. 30 tablet 0  . mexiletine (MEXITIL) 200 MG capsule Take 1 capsule by mouth twice daily 60 capsule 0  . nilotinib (TASIGNA) 150 MG capsule TAKE 2 CAPSULES (  300 MG TOTAL) BY MOUTH EVERY 12 (TWELVE) HOURS. TAKE ON AN EMPTY STOMACH, 1 HR BEFORE OR 2 HRS AFTER FOOD. 112 capsule 10  . nitroGLYCERIN (NITROSTAT) 0.4 MG SL tablet Place 1 tablet (0.4 mg total) under the tongue every 5 (five) minutes as needed for chest pain. 25 tablet 4  . ondansetron (ZOFRAN) 4 MG tablet Take 1 tablet (4 mg total) by mouth every 6 (six) hours. 12 tablet 0  . sacubitril-valsartan (ENTRESTO) 24-26 MG Take 1 tablet by mouth 2 (two) times daily. 60 tablet 5  . sildenafil (VIAGRA) 100 MG tablet Take 0.5-1 tablets (50-100 mg total) by mouth daily as needed for erectile dysfunction. 5 tablet 11   No current facility-administered medications for this visit.    PHYSICAL EXAMINATION: ECOG PERFORMANCE STATUS: 1 - Symptomatic but completely  ambulatory  Vitals:   11/02/20 1025  BP: (!) 145/73  Pulse: 93  Temp: (!) 97 F (36.1 C)  SpO2: 99%   Filed Weights   11/02/20 1025  Weight: 179 lb 3.2 oz (81.3 kg)    GENERAL:alert, no distress and comfortable SKIN: skin color, texture, turgor are normal, no rashes or significant lesions EYES: normal, Conjunctiva are pink and non-injected, sclera clear OROPHARYNX:no exudate, no erythema and lips, buccal mucosa, and tongue normal  NECK: supple, thyroid normal size, non-tender, without nodularity LYMPH:  no palpable lymphadenopathy in the cervical, axillary or inguinal LUNGS: clear to auscultation and percussion with normal breathing effort HEART: regular rate & rhythm and no murmurs and no lower extremity edema ABDOMEN:abdomen soft, non-tender and normal bowel sounds Musculoskeletal:no cyanosis of digits and no clubbing  NEURO: alert & oriented x 3 with fluent speech, no focal motor/sensory deficits  LABORATORY DATA:  I have reviewed the data as listed    Component Value Date/Time   NA 141 10/23/2020 0905   NA 142 06/08/2017 0832   K 4.2 10/23/2020 0905   K 3.9 06/08/2017 0832   CL 110 10/23/2020 0905   CL 110 (H) 11/14/2012 0918   CO2 26 10/23/2020 0905   CO2 25 06/08/2017 0832   GLUCOSE 102 (H) 10/23/2020 0905   GLUCOSE 98 06/08/2017 0832   GLUCOSE 108 (H) 11/14/2012 0918   BUN 10 10/23/2020 0905   BUN 11.6 06/08/2017 0832   CREATININE 0.84 10/23/2020 0905   CREATININE 0.90 05/31/2019 0837   CREATININE 0.9 06/08/2017 0832   CALCIUM 8.4 (L) 10/23/2020 0905   CALCIUM 8.6 06/08/2017 0832   PROT 6.7 10/23/2020 0905   PROT 6.7 06/08/2017 0832   ALBUMIN 3.1 (L) 10/23/2020 0905   ALBUMIN 3.4 (L) 06/08/2017 0832   AST 13 (L) 10/23/2020 0905   AST 13 06/08/2017 0832   ALT 11 10/23/2020 0905   ALT 7 06/08/2017 0832   ALKPHOS 96 10/23/2020 0905   ALKPHOS 96 06/08/2017 0832   BILITOT 0.6 10/23/2020 0905   BILITOT 0.44 06/08/2017 0832   GFRNONAA >60 10/23/2020 0905    GFRNONAA 83 05/31/2019 0837   GFRAA >60 04/24/2020 0842   GFRAA 96 05/31/2019 0837    No results found for: SPEP, UPEP  Lab Results  Component Value Date   WBC 6.9 10/23/2020   NEUTROABS 5.0 10/23/2020   HGB 11.0 (L) 10/23/2020   HCT 34.1 (L) 10/23/2020   MCV 91.7 10/23/2020   PLT 225 10/23/2020      Chemistry      Component Value Date/Time   NA 141 10/23/2020 0905   NA 142 06/08/2017 0832   K 4.2 10/23/2020  0905   K 3.9 06/08/2017 0832   CL 110 10/23/2020 0905   CL 110 (H) 11/14/2012 0918   CO2 26 10/23/2020 0905   CO2 25 06/08/2017 0832   BUN 10 10/23/2020 0905   BUN 11.6 06/08/2017 0832   CREATININE 0.84 10/23/2020 0905   CREATININE 0.90 05/31/2019 0837   CREATININE 0.9 06/08/2017 0832      Component Value Date/Time   CALCIUM 8.4 (L) 10/23/2020 0905   CALCIUM 8.6 06/08/2017 0832   ALKPHOS 96 10/23/2020 0905   ALKPHOS 96 06/08/2017 0832   AST 13 (L) 10/23/2020 0905   AST 13 06/08/2017 0832   ALT 11 10/23/2020 0905   ALT 7 06/08/2017 0832   BILITOT 0.6 10/23/2020 0905   BILITOT 0.44 06/08/2017 3235

## 2020-11-02 NOTE — Assessment & Plan Note (Signed)
BCR/ABL is minimally detected He is still in major molecular response Since our last discussion, he is compliant taking his medication as prescribed I plan to see him back in 3 months for further follow-up

## 2020-11-02 NOTE — Assessment & Plan Note (Signed)
He has no clinical signs or symptoms of congestive heart failure He will continue medical management 

## 2020-11-04 ENCOUNTER — Telehealth: Payer: Self-pay | Admitting: *Deleted

## 2020-11-04 NOTE — Telephone Encounter (Signed)
Received request from pharmacy for PA on Mexitil.  PA submitted.   Dx: I47.2- tachycardia   Your information has been submitted to [facility]. [employer] will review the request and will issue a decision, typically within 3-7 days from your submission. You can check the updated outcome later by reopening this request.  If [employer] has not responded in 3-7 days or if you have any questions about your ePA request, please contact [employer] at [phone].

## 2020-11-05 NOTE — Telephone Encounter (Signed)
Received PA determination.   PA approved until 08/07/2021.

## 2020-11-25 ENCOUNTER — Other Ambulatory Visit: Payer: Self-pay | Admitting: Family Medicine

## 2020-11-27 ENCOUNTER — Other Ambulatory Visit: Payer: Self-pay | Admitting: Family Medicine

## 2020-11-27 ENCOUNTER — Ambulatory Visit
Admission: RE | Admit: 2020-11-27 | Discharge: 2020-11-27 | Disposition: A | Discharge status: Home / Self Care | Payer: Medicare HMO | Source: Ambulatory Visit | Attending: Family Medicine | Admitting: Family Medicine

## 2020-11-27 ENCOUNTER — Other Ambulatory Visit: Payer: Self-pay | Admitting: *Deleted

## 2020-11-27 ENCOUNTER — Other Ambulatory Visit: Payer: Self-pay

## 2020-11-27 ENCOUNTER — Telehealth: Payer: Self-pay | Admitting: *Deleted

## 2020-11-27 ENCOUNTER — Encounter: Payer: Self-pay | Admitting: Family Medicine

## 2020-11-27 ENCOUNTER — Ambulatory Visit (INDEPENDENT_AMBULATORY_CARE_PROVIDER_SITE_OTHER): Payer: Medicare HMO | Admitting: Family Medicine

## 2020-11-27 VITALS — BP 138/68 | HR 68 | Temp 98.4°F | Resp 14 | Ht 69.0 in | Wt 178.0 lb

## 2020-11-27 DIAGNOSIS — J449 Chronic obstructive pulmonary disease, unspecified: Secondary | ICD-10-CM | POA: Diagnosis not present

## 2020-11-27 DIAGNOSIS — R059 Cough, unspecified: Secondary | ICD-10-CM | POA: Diagnosis not present

## 2020-11-27 NOTE — Progress Notes (Signed)
Subjective:    Patient ID: Anthony Wolfe, male    DOB: Dec 20, 1945, 75 y.o.   MRN: [id number]   Patient has a history of COPD and emphysema due to remote smoking.  At present he is only treating it with as needed albuterol.  His wife states that he is constantly coughing.  He has mucus.  He states that he has to cough hard frequently to "bring up the mucus that he feels is hanging in his airways".  He denies any hemoptysis.  He denies any fever or chills.  On exam today he is not wheezing.  There are no crackles or rails other than in the right base with chronic crackles due to his previous pleural effusions.  Earlier this week, he was coughing and he developed a pain in his left chest.  The pain is in a vertical pattern from his axilla down to his ribs.  He states that it hurts when he takes a deep breath or cough.  We obtained a chest x-ray that showed no acute disease today.  He is here today for evaluation. Past Medical History:  Diagnosis Date  . AICD (automatic cardioverter/defibrillator) present   . Allergic rhinitis   . 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 Anxiety   . Blood dyscrasia    cmleukemia  . C. difficile colitis 06/16/2014  . CAD (coronary artery disease)    a. s/p 3v CABG 2010. b. NSTEMI 05/2014 in setting of VT. Cath impressions: "Recent IMI with occluded SVG to RCA. Has Right to Right collaterals and collaterals from septal and OM. EF 40%."  . Carotid disease, bilateral ([place])    a. Right CEA 2002; known occluded left carotid. b. Duplex 06/2014: known occluded LICA, stable 3-78% RICA s/p CEA.  . Chronic systolic CHF (congestive heart failure) ([place])   . 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 Depression   . Emphysema of lung ([place])   . HTN (hypertension)   . Hyperlipidemia   . Ischemic cardiomyopathy    a. Prior EF 36%. b. 2014: 50%. c. 05/2014: EF 35-40% by echo, 40% with inf HK by cath.  . NSTEMI (non-ST elevation myocardial infarction) ([place]) 05/2014   . Pleural effusion 05/2014  . Pneumonia 05/23/2014  . Presence of permanent cardiac pacemaker   . Prostate hypertrophy   . PVC's (premature ventricular contractions)    a. PVC's with prior Holter showing PVC load of 22%.  . Shortness of breath dyspnea   . Stroke ([place]) 01  . TIA (transient ischemic attack)    ASPVD, S./P. right CEA, 2002, and known occluded left carotid  . Ventricular tachycardia ([place])    a. Admitted with VT 05/2013 - EPS with inducible sustained monomorphic VT; s/p Medtronic ICD 05/21/14.   Past Surgical History:  Procedure Laterality Date  . BACK SURGERY    . CAROTID ENDARTERECTOMY Right 01  . carotidectomy  2003   right side  . COLONOSCOPY  07/2010  . CORONARY ARTERY BYPASS GRAFT  02/2009   3 vessel  . ELECTROPHYSIOLOGIC STUDY  05/21/14  . ELECTROPHYSIOLOGY STUDY N/A 05/21/2014   Procedure: ELECTROPHYSIOLOGY STUDY;  Surgeon: Deboraha Sprang, MD;  Location: [facility] CATH LAB;  Service: Cardiovascular;  Laterality: N/A;  . EP IMPLANTABLE DEVICE  05/21/14   single chamber Metronic ICD  . LEFT HEART CATHETERIZATION WITH CORONARY/GRAFT ANGIOGRAM N/A 05/19/2014   Procedure: LEFT HEART CATHETERIZATION WITH Beatrix Fetters;  Surgeon: Josue Hector, MD;  Location: [facility] CATH LAB;  Service:  Cardiovascular;  Laterality: N/A;  . PLEURAL BIOPSY N/A 11/27/2014   Procedure: PLEURAL BIOPSY;  Surgeon: [name], MD;  Location: [place];  Service: Thoracic;  Laterality: N/A;  . PLEURAL EFFUSION DRAINAGE Right 11/27/2014   Procedure: DRAINAGE OF PLEURAL EFFUSION;  Surgeon: [name], MD;  Location: [place];  Service: Thoracic;  Laterality: Right;  . TALC PLEURODESIS Right 11/27/2014   Procedure: Pietro Cassis;  Surgeon: [name], MD;  Location: [place];  Service: Thoracic;  Laterality: Right;  Marland Kitchen VIDEO ASSISTED THORACOSCOPY Right 11/27/2014   Procedure: RIGHT VIDEO ASSISTED THORACOSCOPY;  Surgeon: [name], MD;  Location: [employer];  Service:  Thoracic;  Laterality: Right;   Current Outpatient Medications on File Prior to Visit  Medication Sig Dispense Refill  . albuterol (VENTOLIN HFA) 108 (90 Base) MCG/ACT inhaler INHALE 2 PUFFS BY MOUTH EVERY 6 HOURS AS NEEDED FOR WHEEZING FOR SHORTNESS OF BREATH 18 g 0  . atorvastatin (LIPITOR) 80 MG tablet TAKE 1 TABLET BY MOUTH ONCE DAILY AT  6PM. 90 tablet 0  . carvedilol (COREG) 12.5 MG tablet Take 1 tablet by mouth twice daily 180 tablet 2  . clopidogrel (PLAVIX) 75 MG tablet Take 1 tablet by mouth once daily 90 tablet 0  . furosemide (LASIX) 20 MG tablet Take 1 tablet (20 mg total) by mouth every other day. 45 tablet 3  . meclizine (ANTIVERT) 25 MG tablet Take 1 tablet (25 mg total) by mouth 3 (three) times daily as needed for dizziness. 30 tablet 0  . mexiletine (MEXITIL) 200 MG capsule Take 1 capsule by mouth twice daily 60 capsule 0  . nilotinib (TASIGNA) 150 MG capsule TAKE 2 CAPSULES (300 MG TOTAL) BY MOUTH EVERY 12 (TWELVE) HOURS. TAKE ON AN EMPTY STOMACH, 1 HR BEFORE OR 2 HRS AFTER FOOD. 112 capsule 10  . nitroGLYCERIN (NITROSTAT) 0.4 MG SL tablet Place 1 tablet (0.4 mg total) under the tongue every 5 (five) minutes as needed for chest pain. 25 tablet 4  . ondansetron (ZOFRAN) 4 MG tablet Take 1 tablet (4 mg total) by mouth every 6 (six) hours. 12 tablet 0  . sacubitril-valsartan (ENTRESTO) 24-26 MG Take 1 tablet by mouth 2 (two) times daily. 60 tablet 5  . sildenafil (VIAGRA) 100 MG tablet Take 0.5-1 tablets (50-100 mg total) by mouth daily as needed for erectile dysfunction. 5 tablet 11   No current facility-administered medications on file prior to visit.   Allergies  Allergen Reactions  . Norvasc [Amlodipine Besylate] Rash    rash  . Sulfa Antibiotics Hives    Hives    Social History   Socioeconomic History  . Marital status: Married    Spouse name: Not on file  . Number of children: 2  . Years of education: Not on file  . Highest education level: Not on file   Occupational History    Comment: retired Development worker, [employer]  Tobacco Use  . Smoking status: Former Smoker    Packs/day: 3.00    Years: 45.00    Pack years: 135.00    Types: Cigarettes    Quit date: 08/08/1997    Years since quitting: 23.3  . Smokeless tobacco: Former Network engineer  . Vaping Use: Never used  Substance and Sexual Activity  . Alcohol use: No    Alcohol/week: 0.0 standard drinks  . Drug use: No  . Sexual activity: Yes  Other Topics Concern  . Not on file  Social History Narrative  . Not  on file   Social Determinants of Health   Financial Resource Strain: Not on file  Food Insecurity: Not on file  Transportation Needs: Not on file  Physical Activity: Not on file  Stress: Not on file  Social Connections: Not on file  Intimate Partner Violence: Not on file      Review of Systems  Gastrointestinal: Positive for diarrhea.  All other systems reviewed and are negative.      Objective:   Physical Exam Vitals reviewed.  Constitutional:      Appearance: He is well-developed.  Cardiovascular:     Rate and Rhythm: Normal rate and regular rhythm.     Heart sounds: Normal heart sounds. No murmur heard.   Pulmonary:     Effort: Pulmonary effort is normal. No respiratory distress.     Breath sounds: Normal breath sounds. No wheezing or rales.  Chest:    Abdominal:     General: Bowel sounds are normal. There is no distension.     Palpations: Abdomen is soft. There is no mass.     Tenderness: There is no abdominal tenderness. There is no guarding or rebound.   Red cervical indicates area of pain with deep inspiration and coughing.  Based on a clear chest x-ray, his lack of fevers, his normal breath sounds, I believe this is musculoskeletal pain due to coughing.  He has no tenderness to palpation in that area so I do not feel that he has fractured rib        Assessment & Plan:    Chronic obstructive pulmonary disease, unspecified COPD type ([place])  I  believe his chronic coughing and chronic "mucus" are signs of COPD.  In addition to taking Mucinex daily to help decrease mucus production and improve his ability to bring up the mucus, I recommended trying adding brextri 2 inhalations twice daily.  Patient will try samples for 2 weeks and then let me know if this is helping with his coughing and chest congestion.

## 2020-11-27 NOTE — Telephone Encounter (Signed)
Received call from patient.   Reports that he has had productive cough since Sunday, 11/22/2020. Reports that he has pain in L lung base with cough.   Ordered CXR and appointment scheduled.

## 2020-11-30 ENCOUNTER — Ambulatory Visit (INDEPENDENT_AMBULATORY_CARE_PROVIDER_SITE_OTHER): Payer: Medicare HMO

## 2020-11-30 DIAGNOSIS — I255 Ischemic cardiomyopathy: Secondary | ICD-10-CM | POA: Diagnosis not present

## 2020-12-01 LAB — CUP PACEART REMOTE DEVICE CHECK
Battery Remaining Longevity: 56 mo
Battery Voltage: 2.98 V
Brady Statistic RV Percent Paced: 0.01 %
Date Time Interrogation Session: 20220425001704
HighPow Impedance: 53 Ohm
Implantable Lead Implant Date: 20151014
Implantable Lead Location: 753860
Implantable Pulse Generator Implant Date: 20151014
Lead Channel Impedance Value: 342 Ohm
Lead Channel Impedance Value: 399 Ohm
Lead Channel Pacing Threshold Amplitude: 0.625 V
Lead Channel Pacing Threshold Pulse Width: 0.4 ms
Lead Channel Sensing Intrinsic Amplitude: 7.375 mV
Lead Channel Sensing Intrinsic Amplitude: 7.375 mV
Lead Channel Setting Pacing Amplitude: 2 V
Lead Channel Setting Pacing Pulse Width: 0.4 ms
Lead Channel Setting Sensing Sensitivity: 0.3 mV

## 2020-12-18 NOTE — Progress Notes (Signed)
Remote ICD transmission.   

## 2020-12-22 ENCOUNTER — Encounter: Payer: Self-pay | Admitting: Family Medicine

## 2020-12-22 ENCOUNTER — Ambulatory Visit (INDEPENDENT_AMBULATORY_CARE_PROVIDER_SITE_OTHER): Payer: Medicare HMO | Admitting: Family Medicine

## 2020-12-22 ENCOUNTER — Other Ambulatory Visit: Payer: Self-pay

## 2020-12-22 VITALS — BP 148/82 | HR 80 | Temp 98.1°F | Resp 16 | Ht 69.0 in | Wt 184.0 lb

## 2020-12-22 DIAGNOSIS — N419 Inflammatory disease of prostate, unspecified: Secondary | ICD-10-CM | POA: Diagnosis not present

## 2020-12-22 DIAGNOSIS — N5314 Retrograde ejaculation: Secondary | ICD-10-CM

## 2020-12-22 LAB — MICROSCOPIC MESSAGE

## 2020-12-22 LAB — URINALYSIS, ROUTINE W REFLEX MICROSCOPIC
Bilirubin Urine: NEGATIVE
Glucose, UA: NEGATIVE
Ketones, ur: NEGATIVE
Leukocytes,Ua: NEGATIVE
Nitrite: NEGATIVE
Protein, ur: NEGATIVE
Specific Gravity, Urine: 1.029 (ref 1.001–1.035)
pH: 5.5 (ref 5.0–8.0)

## 2020-12-22 NOTE — Progress Notes (Signed)
Subjective:    Patient ID: Anthony Wolfe, male    DOB: 1945/11/12, 75 y.o.   MRN: [id number]   Patient is concerned that he has a prostate infection.  When I asked him to explain his symptoms, he states that he had an erection about a week ago.  He and his wife had intercourse.  However when he ejaculated no semen was produced.  He states that nothing came out.  Sounds like he experienced retrograde ejaculation.  Since that time he has tried the Viagra on another occasion and was unable to achieve an erection.  He felt that this may indicate that there is a prostate infection.  He denies any dysuria.  He denies any urgency or frequency or hesitancy.  Urinalysis today shows no nitrates and no leukocyte esterase and trace blood but otherwise normal.  He denies any pelvic pain, fevers, chills, nausea, vomiting Past Medical History:  Diagnosis Date  . AICD (automatic cardioverter/defibrillator) present   . Allergic rhinitis   . 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 Anxiety   . Blood dyscrasia    cmleukemia  . C. difficile colitis 06/16/2014  . CAD (coronary artery disease)    a. s/p 3v CABG 2010. b. NSTEMI 05/2014 in setting of VT. Cath impressions: "Recent IMI with occluded SVG to RCA. Has Right to Right collaterals and collaterals from septal and OM. EF 40%."  . Carotid disease, bilateral ([place])    a. Right CEA 2002; known occluded left carotid. b. Duplex 06/2014: known occluded LICA, stable 8-92% RICA s/p CEA.  . Chronic systolic CHF (congestive heart failure) ([place])   . CML (chronic myelocytic leukemia) ([place]) 05/09/2012  . Depression   . Emphysema of lung ([place])   . HTN (hypertension)   . Hyperlipidemia   . Ischemic cardiomyopathy    a. Prior EF 36%. b. 2014: 50%. c. 05/2014: EF 35-40% by echo, 40% with inf HK by cath.  . NSTEMI (non-ST elevation myocardial infarction) ([place]) 05/2014  . Pleural effusion 05/2014  . Pneumonia 05/23/2014  . Presence of permanent cardiac pacemaker   .  Prostate hypertrophy   . PVC's (premature ventricular contractions)    a. PVC's with prior Holter showing PVC load of 22%.  . Shortness of breath dyspnea   . Stroke ([place]) 01  . TIA (transient ischemic attack)    ASPVD, S./P. right CEA, 2002, and known occluded left carotid  . Ventricular tachycardia ([place])    a. Admitted with VT 05/2013 - EPS with inducible sustained monomorphic VT; s/p Medtronic ICD 05/21/14.   Past Surgical History:  Procedure Laterality Date  . BACK SURGERY    . CAROTID ENDARTERECTOMY Right 01  . carotidectomy  2003   right side  . COLONOSCOPY  07/2010  . CORONARY ARTERY BYPASS GRAFT  02/2009   3 vessel  . ELECTROPHYSIOLOGIC STUDY  05/21/14  . ELECTROPHYSIOLOGY STUDY N/A 05/21/2014   Procedure: ELECTROPHYSIOLOGY STUDY;  Surgeon: Deboraha Sprang, MD;  Location: [facility] CATH LAB;  Service: Cardiovascular;  Laterality: N/A;  . EP IMPLANTABLE DEVICE  05/21/14   single chamber Metronic ICD  . LEFT HEART CATHETERIZATION WITH CORONARY/GRAFT ANGIOGRAM N/A 05/19/2014   Procedure: LEFT HEART CATHETERIZATION WITH Beatrix Fetters;  Surgeon: Josue Hector, MD;  Location: [facility] CATH LAB;  Service: Cardiovascular;  Laterality: N/A;  . PLEURAL BIOPSY N/A 11/27/2014   Procedure: PLEURAL BIOPSY;  Surgeon: [name], MD;  Location: [place];  Service: Thoracic;  Laterality: N/A;  . PLEURAL EFFUSION  DRAINAGE Right 11/27/2014   Procedure: DRAINAGE OF PLEURAL EFFUSION;  Surgeon: [name], MD;  Location: [place];  Service: Thoracic;  Laterality: Right;  . TALC PLEURODESIS Right 11/27/2014   Procedure: Pietro Cassis;  Surgeon: [name], MD;  Location: [place];  Service: Thoracic;  Laterality: Right;  Marland Kitchen VIDEO ASSISTED THORACOSCOPY Right 11/27/2014   Procedure: RIGHT VIDEO ASSISTED THORACOSCOPY;  Surgeon: [name], MD;  Location: [place];  Service: Thoracic;  Laterality: Right;   Current Outpatient Medications on File Prior to Visit  Medication Sig  Dispense Refill  . albuterol (VENTOLIN HFA) 108 (90 Base) MCG/ACT inhaler INHALE 2 PUFFS BY MOUTH EVERY 6 HOURS AS NEEDED FOR WHEEZING FOR SHORTNESS OF BREATH 18 g 0  . atorvastatin (LIPITOR) 80 MG tablet TAKE 1 TABLET BY MOUTH ONCE DAILY AT  6PM. 90 tablet 0  . Budeson-Glycopyrrol-Formoterol (BREZTRI AEROSPHERE) 160-9-4.8 MCG/ACT AERO Inhale into the lungs.    . carvedilol (COREG) 12.5 MG tablet Take 1 tablet by mouth twice daily 180 tablet 2  . clopidogrel (PLAVIX) 75 MG tablet Take 1 tablet by mouth once daily 90 tablet 0  . furosemide (LASIX) 20 MG tablet Take 1 tablet (20 mg total) by mouth every other day. 45 tablet 3  . meclizine (ANTIVERT) 25 MG tablet Take 1 tablet (25 mg total) by mouth 3 (three) times daily as needed for dizziness. 30 tablet 0  . mexiletine (MEXITIL) 200 MG capsule Take 1 capsule by mouth twice daily 60 capsule 0  . nilotinib (TASIGNA) 150 MG capsule TAKE 2 CAPSULES (300 MG TOTAL) BY MOUTH EVERY 12 (TWELVE) HOURS. TAKE ON AN EMPTY STOMACH, 1 HR BEFORE OR 2 HRS AFTER FOOD. 112 capsule 10  . nitroGLYCERIN (NITROSTAT) 0.4 MG SL tablet Place 1 tablet (0.4 mg total) under the tongue every 5 (five) minutes as needed for chest pain. 25 tablet 4  . ondansetron (ZOFRAN) 4 MG tablet Take 1 tablet (4 mg total) by mouth every 6 (six) hours. 12 tablet 0  . sacubitril-valsartan (ENTRESTO) 24-26 MG Take 1 tablet by mouth 2 (two) times daily. 60 tablet 5  . sildenafil (VIAGRA) 100 MG tablet Take 0.5-1 tablets (50-100 mg total) by mouth daily as needed for erectile dysfunction. 5 tablet 11   No current facility-administered medications on file prior to visit.   Allergies  Allergen Reactions  . Norvasc [Amlodipine Besylate] Rash    rash  . Sulfa Antibiotics Hives    Hives    Social History   Socioeconomic History  . Marital status: Married    Spouse name: Not on file  . Number of children: 2  . Years of education: Not on file  . Highest education level: Not on file   Occupational History    Comment: retired Development worker, international aid  Tobacco Use  . Smoking status: Former Smoker    Packs/day: 3.00    Years: 45.00    Pack years: 135.00    Types: Cigarettes    Quit date: 08/08/1997    Years since quitting: 23.3  . Smokeless tobacco: Former Network engineer  . Vaping Use: Never used  Substance and Sexual Activity  . Alcohol use: No    Alcohol/week: 0.0 standard drinks  . Drug use: No  . Sexual activity: Yes  Other Topics Concern  . Not on file  Social History Narrative  . Not on file   Social Determinants of Health   Financial Resource Strain: Not on file  Food Insecurity: Not on  file  Transportation Needs: Not on file  Physical Activity: Not on file  Stress: Not on file  Social Connections: Not on file  Intimate Partner Violence: Not on file      Review of Systems  Gastrointestinal: Positive for diarrhea.  All other systems reviewed and are negative.      Objective:   Physical Exam Vitals reviewed.  Constitutional:      Appearance: He is well-developed.  Cardiovascular:     Rate and Rhythm: Normal rate and regular rhythm.     Heart sounds: Normal heart sounds. No murmur heard.   Pulmonary:     Effort: Pulmonary effort is normal. No respiratory distress.     Breath sounds: Normal breath sounds. No wheezing or rales.  Abdominal:     General: Bowel sounds are normal. There is no distension.     Palpations: Abdomen is soft. There is no mass.     Tenderness: There is no abdominal tenderness. There is no guarding or rebound.          Assessment & Plan:    Retrograde ejaculation - Plan: Urinalysis, Routine w reflex microscopic  Urinalysis today is unremarkable.  Based on his symptoms, it sounds like the patient experienced retrograde ejaculation and has erectile dysfunction.  I explained the mechanism of action of Viagra.  I explained that it does not always work.  Sometimes he may achieve erections and sometimes he may not.  I  encouraged him to continue to try the Viagra on an as-needed basis.  At the present time I do not see any indication or evidence of a urinary tract infection and he denies any lower urinary tract symptoms that would suggest prostatitis

## 2021-01-05 ENCOUNTER — Other Ambulatory Visit: Payer: Self-pay | Admitting: Family Medicine

## 2021-01-05 ENCOUNTER — Other Ambulatory Visit: Payer: Self-pay | Admitting: Internal Medicine

## 2021-01-12 ENCOUNTER — Other Ambulatory Visit: Payer: Self-pay | Admitting: Family Medicine

## 2021-01-22 ENCOUNTER — Inpatient Hospital Stay: Payer: Medicare HMO | Attending: Hematology and Oncology

## 2021-01-22 ENCOUNTER — Other Ambulatory Visit: Payer: Self-pay

## 2021-01-22 DIAGNOSIS — Z79899 Other long term (current) drug therapy: Secondary | ICD-10-CM | POA: Insufficient documentation

## 2021-01-22 DIAGNOSIS — T451X5A Adverse effect of antineoplastic and immunosuppressive drugs, initial encounter: Secondary | ICD-10-CM | POA: Insufficient documentation

## 2021-01-22 DIAGNOSIS — D6481 Anemia due to antineoplastic chemotherapy: Secondary | ICD-10-CM | POA: Insufficient documentation

## 2021-01-22 DIAGNOSIS — Z882 Allergy status to sulfonamides status: Secondary | ICD-10-CM | POA: Insufficient documentation

## 2021-01-22 DIAGNOSIS — I255 Ischemic cardiomyopathy: Secondary | ICD-10-CM | POA: Insufficient documentation

## 2021-01-22 DIAGNOSIS — C9210 Chronic myeloid leukemia, BCR/ABL-positive, not having achieved remission: Secondary | ICD-10-CM | POA: Insufficient documentation

## 2021-01-22 LAB — CMP (CANCER CENTER ONLY)
ALT: 14 U/L (ref 0–44)
AST: 13 U/L — ABNORMAL LOW (ref 15–41)
Albumin: 3.5 g/dL (ref 3.5–5.0)
Alkaline Phosphatase: 98 U/L (ref 38–126)
Anion gap: 6 (ref 5–15)
BUN: 17 mg/dL (ref 8–23)
CO2: 27 mmol/L (ref 22–32)
Calcium: 8.9 mg/dL (ref 8.9–10.3)
Chloride: 108 mmol/L (ref 98–111)
Creatinine: 1.13 mg/dL (ref 0.61–1.24)
GFR, Estimated: >60 mL/min (ref >60)
Glucose, Bld: 102 mg/dL — ABNORMAL HIGH (ref 70–99)
Potassium: 4.5 mmol/L (ref 3.5–5.1)
Sodium: 141 mmol/L (ref 135–145)
Total Bilirubin: 0.3 mg/dL (ref 0.3–1.2)
Total Protein: 6.6 g/dL (ref 6.5–8.1)

## 2021-01-22 LAB — CBC WITH DIFFERENTIAL/PLATELET
Abs Immature Granulocytes: 0.03 10*3/uL (ref 0.00–0.07)
Basophils Absolute: 0.1 10*3/uL (ref 0.0–0.1)
Basophils Relative: 1 %
Eosinophils Absolute: 0.1 10*3/uL (ref 0.0–0.5)
Eosinophils Relative: 2 %
HCT: 36 % — ABNORMAL LOW (ref 39.0–52.0)
Hemoglobin: 11.6 g/dL — ABNORMAL LOW (ref 13.0–17.0)
Immature Granulocytes: 0 %
Lymphocytes Relative: 14 %
Lymphs Abs: 1.2 10*3/uL (ref 0.7–4.0)
MCH: 30.6 pg (ref 26.0–34.0)
MCHC: 32.2 g/dL (ref 30.0–36.0)
MCV: 95 fL (ref 80.0–100.0)
Monocytes Absolute: 0.7 10*3/uL (ref 0.1–1.0)
Monocytes Relative: 9 %
Neutro Abs: 5.9 10*3/uL (ref 1.7–7.7)
Neutrophils Relative %: 74 %
Platelets: 201 10*3/uL (ref 150–400)
RBC: 3.79 MIL/uL — ABNORMAL LOW (ref 4.22–5.81)
RDW: 15.6 % — ABNORMAL HIGH (ref 11.5–15.5)
WBC: 8 10*3/uL (ref 4.0–10.5)
nRBC: 0 % (ref 0.0–0.2)

## 2021-01-29 LAB — BCR/ABL

## 2021-02-01 ENCOUNTER — Encounter: Payer: Self-pay | Admitting: Hematology and Oncology

## 2021-02-01 ENCOUNTER — Telehealth: Payer: Self-pay | Admitting: Hematology and Oncology

## 2021-02-01 ENCOUNTER — Other Ambulatory Visit: Payer: Self-pay

## 2021-02-01 ENCOUNTER — Inpatient Hospital Stay: Payer: Medicare HMO | Admitting: Hematology and Oncology

## 2021-02-01 DIAGNOSIS — I255 Ischemic cardiomyopathy: Secondary | ICD-10-CM | POA: Diagnosis not present

## 2021-02-01 DIAGNOSIS — Z882 Allergy status to sulfonamides status: Secondary | ICD-10-CM | POA: Diagnosis not present

## 2021-02-01 DIAGNOSIS — C9210 Chronic myeloid leukemia, BCR/ABL-positive, not having achieved remission: Secondary | ICD-10-CM | POA: Diagnosis not present

## 2021-02-01 DIAGNOSIS — D6481 Anemia due to antineoplastic chemotherapy: Secondary | ICD-10-CM | POA: Diagnosis not present

## 2021-02-01 DIAGNOSIS — T451X5A Adverse effect of antineoplastic and immunosuppressive drugs, initial encounter: Secondary | ICD-10-CM | POA: Diagnosis not present

## 2021-02-01 DIAGNOSIS — Z79899 Other long term (current) drug therapy: Secondary | ICD-10-CM | POA: Diagnosis not present

## 2021-02-01 NOTE — Telephone Encounter (Signed)
Scheduled appointment per 06/27 sch msg. Patient is aware.

## 2021-02-01 NOTE — Assessment & Plan Note (Signed)
This is likely anemia of chronic disease. The patient denies recent history of bleeding such as epistaxis, hematuria or hematochezia. He is asymptomatic from the anemia. We will observe for now.  

## 2021-02-01 NOTE — Assessment & Plan Note (Signed)
He has no clinical signs or symptoms of congestive heart failure He will continue medical management 

## 2021-02-01 NOTE — Assessment & Plan Note (Signed)
BCR/ABL is minimally detected He is still in major molecular response Since our last discussion, he is compliant taking his medication as prescribed I plan to see him back in 4 months for further follow-up

## 2021-02-01 NOTE — Progress Notes (Signed)
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 The patient denies recent history of bleeding such as epistaxis, hematuria or hematochezia. He is asymptomatic from the anemia. We will observe for now.  Cardiomyopathy, ischemic He has no clinical signs or symptoms of congestive heart failure He will continue medical management  No orders of the defined types were placed in this encounter.   All questions were answered. The patient knows to call the clinic with any problems, questions or concerns. The total time spent in the appointment was 20 minutes encounter with patients including review of chart and various tests results, discussions about plan of care and coordination of care plan   [name], MD 02/01/2021 9:05 AM  INTERVAL HISTORY: Please see below for problem oriented charting. Returns with his wife for further follow-up He is doing very well No new side effects from treatment No recent fluid retention Denies diarrhea He denies missing doses  SUMMARY OF ONCOLOGIC HISTORY: Oncology History  Chronic myelogenous leukemia ([place])   05/04/2012 Bone Marrow Biopsy   BM confirmed diagnosis of CML in Chronic phase. BCR/ABL by PCR detected abnormalitis with b2a2 & b3a2 subtypes    05/09/2012 - 03/16/2015 Chemotherapy   He was started on treatment with Dasatinib 100 mg daily    06/06/2012 Adverse Reaction   Dose of medication was reduced to 50 mg daily due to pancytopenia    01/24/2013 Progression   Patient was noted to have elevated blood count which and was subsequently found to be noncompliant to treatment. The patient has not been on treatment for several months because his prescription ran out. He was restarted back on treatment    01/16/2014 Progression   Bcr/ABL by PCR is worse. Dose of Dasatinib was increased to 100 mg.    02/26/2014 Tumor Marker   Blood work for ABL kinase mutation was negative.    10/29/2014 Tumor Marker   BCR/ABL b2a2 & b3a2 0.29%, IS 0.1624%, not in MMR yet but improving    11/05/2014 Adverse Reaction   He had thoracentesis due to pleural effusion    11/27/2014 Surgery   He underwent right video-assisted thoracoscopy, Drainage of pleural effusion, Pleural biopsy, Diaphragm biopsy, Lung biopsy & [place] pleurodesis    01/30/2015 Tumor Marker   BCR/ABL b2a2 & b3a2 0.78%, IS 0.4368%, not in MMR     03/11/2015 Pathology Results   BCR/ABL b2a2 0.19%, IS 0.1064%, not in MMR     03/16/2015 Adverse Reaction   Dasatinib was stopped due to recurrent pleural effusion    03/28/2015 - 11/06/2017 Chemotherapy   He started on Bosutinib    03/30/2015 Procedure   He has therapeutic thoracentesis of the right lung with 1 liter of fluid removed  03/30/2015 Adverse Reaction   Bosutinib is placed on hold, to be restart on 8/25 at 250 mg due to severe diarrhea    04/28/2015 Tumor Marker   BCR/ABL b2a2 0.06%, IS 0.0336%, in MMR     06/10/2015 Tumor Marker   BCR/ABL b2a2 0.14%, IS 0.1162%, not in MMR     08/14/2015 Pathology Results   BCR/ABL b2a2 0.02%, IS 0.0166%, In MMR     11/09/2015 Tumor  Marker   BCR/ABL b2a2 0.005%, IS 0.0042%, In MMR     02/08/2016 Tumor Marker   BCR/ABL undetectable. In MMR   06/13/2016 Tumor Marker   BCR/ABL undetectable. In MMR   12/06/2016 Pathology Results   BCR/ABL undetectable. In MMR   06/08/2017 Pathology Results   BCR/ABL undetectable. In MMR   09/08/2017 Pathology Results   BCR/ABL undetectable. In MMR   12/11/2017 Pathology Results   BCR/ABL undetectable. In MMR   03/19/2018 Pathology Results   BCR/ABL undetectable. In MMR   06/12/2018 Pathology Results   BCR/ABL undetectable. In MMR   09/06/2018 Pathology Results   BCR ABL is detectable. e13a2 (b2a2) and [id number]) IS: 0.009%    09/26/2018 -  Chemotherapy   The patient had Tasigna     11/21/2018 Pathology Results   BCR/ABL undetectable. In MMR   03/07/2019 Pathology Results   BCR/ABL undetectable. In MMR   06/17/2019 Pathology Results   BCR/ABL undetectable. In MMR   10/14/2019 Pathology Results   BCR ABL is detectable. e13a2 (b2a2) and F79K2(I0X7) IS: 0.0281%. In MMR   01/14/2020 Pathology Results   BCR ABL is detectable. e13a2 (b2a2) and D53G9(J2E2) IS: 0.023%. In MMR   10/23/2020 Pathology Results   BCR ABL is detectable. e13a2 (b2a2) and A83M1(D6Q2) IS: 0.0643%. In MMR   01/22/2021 Pathology Results   BCR ABL is detectable. e13a2 (b2a2) and W97L8(X2J1) IS: 0.027%. In MMR     REVIEW OF SYSTEMS:   Constitutional: Denies fevers, chills or abnormal weight loss Eyes: Denies blurriness of vision Ears, nose, mouth, throat, and face: Denies mucositis or sore throat Respiratory: Denies cough, dyspnea or wheezes Cardiovascular: Denies palpitation, chest discomfort or lower extremity swelling Gastrointestinal:  Denies nausea, heartburn or change in bowel habits Skin: Denies abnormal skin rashes Lymphatics: Denies new lymphadenopathy or easy bruising Neurological:Denies numbness, tingling or new weaknesses Behavioral/Psych: Mood is stable, no new changes  All other systems were  reviewed with the patient and are negative.  I have reviewed the past medical history, past surgical history, social history and family history with the patient and they are unchanged from previous note.  ALLERGIES:  is allergic to norvasc [amlodipine besylate] and sulfa antibiotics.  MEDICATIONS:  Current Outpatient Medications  Medication Sig Dispense Refill   albuterol (VENTOLIN HFA) 108 (90 Base) MCG/ACT inhaler INHALE 2 PUFFS BY MOUTH EVERY 6 HOURS AS NEEDED FOR WHEEZING FOR SHORTNESS OF BREATH 18 g 0   atorvastatin (LIPITOR) 80 MG tablet TAKE ONE TABLET BY MOUTH ONCE DAILY AT 6 PM. 90 tablet 0   Budeson-Glycopyrrol-Formoterol (BREZTRI AEROSPHERE) 160-9-4.8 MCG/ACT AERO Inhale into the lungs.     carvedilol (COREG) 12.5 MG tablet Take 1 tablet by mouth twice daily 180 tablet 0   clopidogrel (PLAVIX) 75 MG tablet Take 1 tablet by mouth once daily 90 tablet 0   furosemide (LASIX) 20 MG tablet Take 1 tablet (20 mg total) by mouth every other day. 45 tablet 3   meclizine (ANTIVERT) 25 MG tablet Take 1 tablet (25 mg total) by mouth 3 (three) times daily  as needed for dizziness. 30 tablet 0   mexiletine (MEXITIL) 200 MG capsule Take 1 capsule by mouth twice daily 60 capsule 0   nilotinib (TASIGNA) 150 MG capsule TAKE 2 CAPSULES (300 MG TOTAL) BY MOUTH EVERY 12 (TWELVE) HOURS. TAKE ON AN EMPTY STOMACH, 1 HR BEFORE OR 2 HRS AFTER FOOD. 112 capsule 10   nitroGLYCERIN (NITROSTAT) 0.4 MG SL tablet Place 1 tablet (0.4 mg total) under the tongue every 5 (five) minutes as needed for chest pain. 25 tablet 4   ondansetron (ZOFRAN) 4 MG tablet Take 1 tablet (4 mg total) by mouth every 6 (six) hours. 12 tablet 0   sacubitril-valsartan (ENTRESTO) 24-26 MG Take 1 tablet by mouth 2 (two) times daily. 60 tablet 5   sildenafil (VIAGRA) 100 MG tablet Take 0.5-1 tablets (50-100 mg total) by mouth daily as needed for erectile dysfunction. 5 tablet 11   No current facility-administered medications for this visit.     PHYSICAL EXAMINATION: ECOG PERFORMANCE STATUS: 1 - Symptomatic but completely ambulatory  Vitals:   02/01/21 0852  BP: 129/63  Pulse: 63  Resp: 18  Temp: 97.6 F (36.4 C)  SpO2: 100%   Filed Weights   02/01/21 0852  Weight: 182 lb 9.6 oz (82.8 kg)    GENERAL:alert, no distress and comfortable SKIN: skin color, texture, turgor are normal, no rashes or significant lesions EYES: normal, Conjunctiva are pink and non-injected, sclera clear OROPHARYNX:no exudate, no erythema and lips, buccal mucosa, and tongue normal  NECK: supple, thyroid normal size, non-tender, without nodularity LYMPH:  no palpable lymphadenopathy in the cervical, axillary or inguinal LUNGS: clear to auscultation and percussion with normal breathing effort HEART: regular rate & rhythm and no murmurs and no lower extremity edema ABDOMEN:abdomen soft, non-tender and normal bowel sounds Musculoskeletal:no cyanosis of digits and no clubbing  NEURO: alert & oriented x 3 with fluent speech, no focal motor/sensory deficits  LABORATORY DATA:  I have reviewed the data as listed    Component Value Date/Time   NA 141 01/22/2021 0840   NA 142 06/08/2017 0832   K 4.5 01/22/2021 0840   K 3.9 06/08/2017 0832   CL 108 01/22/2021 0840   CL 110 (H) 11/14/2012 0918   CO2 27 01/22/2021 0840   CO2 25 06/08/2017 0832   GLUCOSE 102 (H) 01/22/2021 0840   GLUCOSE 98 06/08/2017 0832   GLUCOSE 108 (H) 11/14/2012 0918   BUN 17 01/22/2021 0840   BUN 11.6 06/08/2017 0832   CREATININE 1.13 01/22/2021 0840   CREATININE 0.90 05/31/2019 0837   CREATININE 0.9 06/08/2017 0832   CALCIUM 8.9 01/22/2021 0840   CALCIUM 8.6 06/08/2017 0832   PROT 6.6 01/22/2021 0840   PROT 6.7 06/08/2017 0832   ALBUMIN 3.5 01/22/2021 0840   ALBUMIN 3.4 (L) 06/08/2017 0832   AST 13 (L) 01/22/2021 0840   AST 13 06/08/2017 0832   ALT 14 01/22/2021 0840   ALT 7 06/08/2017 0832   ALKPHOS 98 01/22/2021 0840   ALKPHOS 96 06/08/2017 0832   BILITOT  0.3 01/22/2021 0840   BILITOT 0.44 06/08/2017 0832   GFRNONAA >60 01/22/2021 0840   GFRNONAA 83 05/31/2019 0837   GFRAA >60 04/24/2020 0842   GFRAA 96 05/31/2019 0837    No results found for: SPEP, UPEP  Lab Results  Component Value Date   WBC 8.0 01/22/2021   NEUTROABS 5.9 01/22/2021   HGB 11.6 (L) 01/22/2021   HCT 36.0 (L) 01/22/2021   MCV 95.0 01/22/2021   PLT  201 01/22/2021      Chemistry      Component Value Date/Time   NA 141 01/22/2021 0840   NA 142 06/08/2017 0832   K 4.5 01/22/2021 0840   K 3.9 06/08/2017 0832   CL 108 01/22/2021 0840   CL 110 (H) 11/14/2012 0918   CO2 27 01/22/2021 0840   CO2 25 06/08/2017 0832   BUN 17 01/22/2021 0840   BUN 11.6 06/08/2017 0832   CREATININE 1.13 01/22/2021 0840   CREATININE 0.90 05/31/2019 0837   CREATININE 0.9 06/08/2017 0832      Component Value Date/Time   CALCIUM 8.9 01/22/2021 0840   CALCIUM 8.6 06/08/2017 0832   ALKPHOS 98 01/22/2021 0840   ALKPHOS 96 06/08/2017 0832   AST 13 (L) 01/22/2021 0840   AST 13 06/08/2017 0832   ALT 14 01/22/2021 0840   ALT 7 06/08/2017 0832   BILITOT 0.3 01/22/2021 0840   BILITOT 0.44 06/08/2017 7471

## 2021-02-02 ENCOUNTER — Other Ambulatory Visit: Payer: Self-pay | Admitting: Family Medicine

## 2021-02-18 ENCOUNTER — Other Ambulatory Visit: Payer: Self-pay

## 2021-02-18 ENCOUNTER — Encounter: Payer: Self-pay | Admitting: Family Medicine

## 2021-02-18 ENCOUNTER — Ambulatory Visit (INDEPENDENT_AMBULATORY_CARE_PROVIDER_SITE_OTHER): Payer: Medicare HMO | Admitting: Family Medicine

## 2021-02-18 VITALS — BP 142/88 | HR 66 | Temp 98.7°F | Resp 16 | Ht 69.0 in | Wt 182.0 lb

## 2021-02-18 DIAGNOSIS — I509 Heart failure, unspecified: Secondary | ICD-10-CM | POA: Diagnosis not present

## 2021-02-18 DIAGNOSIS — J449 Chronic obstructive pulmonary disease, unspecified: Secondary | ICD-10-CM | POA: Diagnosis not present

## 2021-02-18 DIAGNOSIS — Z8679 Personal history of other diseases of the circulatory system: Secondary | ICD-10-CM | POA: Diagnosis not present

## 2021-02-18 DIAGNOSIS — I429 Cardiomyopathy, unspecified: Secondary | ICD-10-CM

## 2021-02-18 DIAGNOSIS — I255 Ischemic cardiomyopathy: Secondary | ICD-10-CM

## 2021-02-18 DIAGNOSIS — I10 Essential (primary) hypertension: Secondary | ICD-10-CM | POA: Diagnosis not present

## 2021-02-18 DIAGNOSIS — Z13220 Encounter for screening for lipoid disorders: Secondary | ICD-10-CM | POA: Diagnosis not present

## 2021-02-18 DIAGNOSIS — Z136 Encounter for screening for cardiovascular disorders: Secondary | ICD-10-CM | POA: Diagnosis not present

## 2021-02-18 NOTE — Progress Notes (Signed)
Subjective:    Patient ID: Anthony Wolfe, male    DOB: February 08, 1946, 75 y.o.   MRN: [id number]   He has a history of coronary artery disease status post three-vessel CABG in 2010.  He also had a non-ST elevation myocardial infarction in 2015.  He also has ischemic cardiomyopathy with an ejection fraction of 40-45%.  Also has a h/o TIA, CML, and COPD.  Overall, he seems to be doing very well.  He denies any angina.  He denies any paroxysmal nocturnal dyspnea.  He denies any orthopnea.  He is having to take Lasix once a week usually if he eats too much salt.  He denies any shortness of breath with activity and is still very active.  His blood pressure today is borderline however he states at times it is too low.  He is currently on a combination of carvedilol, Lasix, and Entresto for his congestive heart failure.  He also has a history of COPD.  He is not taking the [employer].  He felt like it made his tongue swell.  He does have albuterol at home in case he needs it however he has not needed it recently.  He denies any wheezing or cough although he does have daily mucus production in the morning.  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 difficile colitis 06/16/2014   CAD (coronary artery disease)    a. s/p 3v CABG 2010. b. NSTEMI 05/2014 in setting of VT. Cath impressions: "Recent IMI with occluded SVG to RCA. Has Right to Right collaterals and collaterals from septal and OM. EF 40%."   Carotid disease, bilateral ([place])    a. Right CEA 2002; known occluded left carotid. b. Duplex 06/2014: known occluded LICA, stable 7-51% RICA s/p CEA.   Chronic systolic CHF (congestive heart failure) (H[facility])    CML (chronic myelocytic leukemia) ([place]) 05/09/2012   Depression    Emphysema of lung ([facility])    HTN (hypertension)    Hyperlipidemia    Ischemic  cardiomyopathy    a. Prior EF 36%. b. 2014: 50%. c. 05/2014: EF 35-40% by echo, 40% with inf HK by cath.   NSTEMI (non-ST elevation myocardial infarction) ([place]) 05/2014   Pleural effusion 05/2014   Pneumonia 05/23/2014   Presence of permanent cardiac pacemaker    Prostate hypertrophy    PVC's (premature ventricular contractions)    a. PVC's with prior Holter showing PVC load of 22%.   Shortness of breath dyspnea    Stroke ([place]) 01   TIA (transient ischemic attack)    ASPVD, S./P. right CEA, 2002, and known occluded left carotid   Ventricular tachycardia ([place])    a. Admitted with VT 05/2013 - EPS with inducible sustained monomorphic VT; s/p Medtronic ICD 05/21/14.   Past Surgical History:  Procedure Laterality Date   BACK SURGERY     CAROTID ENDARTERECTOMY Right 01   carotidectomy  2003   right side   COLONOSCOPY  07/2010   CORONARY ARTERY BYPASS GRAFT  02/2009   3 vessel   ELECTROPHYSIOLOGIC STUDY  05/21/14   ELECTROPHYSIOLOGY STUDY N/A 05/21/2014   Procedure: ELECTROPHYSIOLOGY STUDY;  Surgeon: Deboraha Sprang, MD;  Location: [facility] C[facility];  Service: Cardiovascular;  Laterality: N/A;   EP IMPLANTABLE DEVICE  05/21/14   single chamber Metronic ICD  LEFT HEART CATHETERIZATION WITH CORONARY/GRAFT ANGIOGRAM N/A 05/19/2014   Procedure: LEFT HEART CATHETERIZATION WITH Beatrix Fetters;  Surgeon: Josue Hector, MD;  Location: [facility] CATH LAB;  Service: Cardiovascular;  Laterality: N/A;   PLEURAL BIOPSY N/A 11/27/2014   Procedure: PLEURAL BIOPSY;  Surgeon: [name], MD;  Location: [place];  Service: Thoracic;  Laterality: N/A;   PLEURAL EFFUSION DRAINAGE Right 11/27/2014   Procedure: DRAINAGE OF PLEURAL EFFUSION;  Surgeon: [name], MD;  Location: [place];  Service: Thoracic;  Laterality: Right;   TALC PLEURODESIS Right 11/27/2014   Procedure: Pietro Cassis;  Surgeon: [name], MD;  Location: [place];  Service: Thoracic;  Laterality: Right;   VIDEO  ASSISTED THORACOSCOPY Right 11/27/2014   Procedure: RIGHT VIDEO ASSISTED THORACOSCOPY;  Surgeon: [name], MD;  Location: [place];  Service: Thoracic;  Laterality: Right;   Current Outpatient Medications on File Prior to Visit  Medication Sig Dispense Refill   albuterol (VENTOLIN HFA) 108 (90 Base) MCG/ACT inhaler INHALE 2 PUFFS BY MOUTH EVERY 6 HOURS AS NEEDED FOR WHEEZING FOR SHORTNESS OF BREATH 18 g 0   atorvastatin (LIPITOR) 80 MG tablet TAKE ONE TABLET BY MOUTH ONCE DAILY AT 6 PM. 90 tablet 0   Budeson-Glycopyrrol-Formoterol (BREZTRI AEROSPHERE) 160-9-4.8 MCG/ACT AERO Inhale into the lungs.     carvedilol (COREG) 12.5 MG tablet Take 1 tablet by mouth twice daily 180 tablet 0   clopidogrel (PLAVIX) 75 MG tablet Take 1 tablet by mouth once daily 90 tablet 0   furosemide (LASIX) 20 MG tablet Take 1 tablet (20 mg total) by mouth every other day. 45 tablet 3   meclizine (ANTIVERT) 25 MG tablet Take 1 tablet (25 mg total) by mouth 3 (three) times daily as needed for dizziness. 30 tablet 0   mexiletine (MEXITIL) 200 MG capsule Take 1 capsule by mouth twice daily 60 capsule 3   nilotinib (TASIGNA) 150 MG capsule TAKE 2 CAPSULES (300 MG TOTAL) BY MOUTH EVERY 12 (TWELVE) HOURS. TAKE ON AN EMPTY STOMACH, 1 HR BEFORE OR 2 HRS AFTER FOOD. 112 capsule 10   nitroGLYCERIN (NITROSTAT) 0.4 MG SL tablet Place 1 tablet (0.4 mg total) under the tongue every 5 (five) minutes as needed for chest pain. 25 tablet 4   ondansetron (ZOFRAN) 4 MG tablet Take 1 tablet (4 mg total) by mouth every 6 (six) hours. 12 tablet 0   sacubitril-valsartan (ENTRESTO) 24-26 MG Take 1 tablet by mouth 2 (two) times daily. 60 tablet 5   sildenafil (VIAGRA) 100 MG tablet Take 0.5-1 tablets (50-100 mg total) by mouth daily as needed for erectile dysfunction. 5 tablet 11   No current facility-administered medications on file prior to visit.   Allergies  Allergen Reactions   Norvasc [Amlodipine Besylate] Rash    rash   Sulfa  Antibiotics Hives    Hives    Social History   Socioeconomic History   Marital status: Married    Spouse name: Not on file   Number of children: 2   Years of education: Not on file   Highest education level: Not on file  Occupational History    Comment: retired Development worker, international [employer]  Tobacco Use   Smoking status: Former    Packs/day: 3.00    Years: 45.00    Pack years: 135.00    Types: Cigarettes    Quit date: 08/08/1997    Years since quitting: 23.5   Smokeless tobacco: Former  Scientific laboratory technician Use: Never used  Substance and Sexual Activity   Alcohol use: No    Alcohol/week: 0.0 standard drinks   Drug use: No   Sexual activity: Yes  Other Topics Concern   Not on file  Social History Narrative   Not on file   Social Determinants of Health   Financial Resource Strain: Not on file  Food Insecurity: Not on file  Transportation Needs: Not on file  Physical Activity: Not on file  Stress: Not on file  Social Connections: Not on file  Intimate Partner Violence: Not on file      Review of Systems  Gastrointestinal:  Positive for diarrhea.  All other systems reviewed and are negative.     Objective:   Physical Exam Vitals reviewed.  Constitutional:      Appearance: He is well-developed.  Cardiovascular:     Rate and Rhythm: Normal rate and regular rhythm.     Heart sounds: Normal heart sounds. No murmur heard. Pulmonary:     Effort: Pulmonary effort is normal. No respiratory distress.     Breath sounds: Normal breath sounds. No wheezing or rales.  Abdominal:     General: Bowel sounds are normal. There is no distension.     Palpations: Abdomen is soft. There is no mass.     Tenderness: There is no abdominal tenderness. There is no guarding or rebound.          Assessment & Plan:  History of ASCVD - Plan: Lipid panel  Cardiomyopathy, ischemic  Benign essential HTN  Congestive heart failure with cardiomyopathy ([place])  Chronic obstructive pulmonary disease,  unspecified COPD type ([place]) Patient seems to be doing very well.  He seems euvolemic today.  I have asked the patient to check his blood pressure at home and if his systolic blood pressures consistently in the 140s I would like to increase the strength of the Entresto.  Continue carvedilol as prescribed and use Lasix as needed.  I reviewed his CBC and CMP from June which was normal.  He is due to check a fasting lipid panel and I would like his LDL cholesterol to be below 70 given his history of coronary artery disease and myocardial infarction

## 2021-02-19 LAB — LIPID PANEL
Cholesterol: 116 mg/dL (ref <200)
HDL: 36 mg/dL — ABNORMAL LOW (ref >=40)
LDL Cholesterol (Calc): 63 mg/dL (calc)
Non-HDL Cholesterol (Calc): 80 mg/dL (calc) (ref <130)
Total CHOL/HDL Ratio: 3.2 (calc) (ref <5.0)
Triglycerides: 90 mg/dL (ref <150)

## 2021-03-01 ENCOUNTER — Ambulatory Visit (INDEPENDENT_AMBULATORY_CARE_PROVIDER_SITE_OTHER): Payer: Medicare HMO

## 2021-03-01 ENCOUNTER — Telehealth: Payer: Self-pay

## 2021-03-01 DIAGNOSIS — I255 Ischemic cardiomyopathy: Secondary | ICD-10-CM

## 2021-03-01 NOTE — Telephone Encounter (Signed)
Pt's wife called in stating that pt will also need a referral to a cardiologist to check the blockages in pt's neck. Please advise  Cb#: [phone]

## 2021-03-01 NOTE — Telephone Encounter (Signed)
Please advise 

## 2021-03-03 LAB — CUP PACEART REMOTE DEVICE CHECK
Battery Remaining Longevity: 50 mo
Battery Voltage: 2.98 V
Brady Statistic RV Percent Paced: 0.04 %
Date Time Interrogation Session: 20220725012303
HighPow Impedance: 55 Ohm
Implantable Lead Implant Date: 20151014
Implantable Lead Location: 753860
Implantable Pulse Generator Implant Date: 20151014
Lead Channel Impedance Value: 361 Ohm
Lead Channel Impedance Value: 418 Ohm
Lead Channel Pacing Threshold Amplitude: 0.625 V
Lead Channel Pacing Threshold Pulse Width: 0.4 ms
Lead Channel Sensing Intrinsic Amplitude: 8.125 mV
Lead Channel Sensing Intrinsic Amplitude: 8.125 mV
Lead Channel Setting Pacing Amplitude: 2 V
Lead Channel Setting Pacing Pulse Width: 0.4 ms
Lead Channel Setting Sensing Sensitivity: 0.3 mV

## 2021-03-03 NOTE — Telephone Encounter (Signed)
Call placed to patient and patient wife made aware.

## 2021-03-25 NOTE — Progress Notes (Signed)
Remote ICD transmission.   

## 2021-03-30 ENCOUNTER — Telehealth: Payer: Self-pay | Admitting: Family Medicine

## 2021-03-30 NOTE — Telephone Encounter (Signed)
Received voicemail from Lauree Chandler; stated patient needs to see a cardiologist. Please advise at [phone].

## 2021-03-31 NOTE — Telephone Encounter (Signed)
Call placed to patient to inquire.   Reports that he has had cough and some slight SOB with cough. States that he began taking Robitussin and Sx are improved.   Offered appointment, but family and patient declined at this time.

## 2021-04-02 ENCOUNTER — Other Ambulatory Visit: Payer: Self-pay | Admitting: Family Medicine

## 2021-04-13 ENCOUNTER — Other Ambulatory Visit: Payer: Self-pay | Admitting: Internal Medicine

## 2021-04-13 ENCOUNTER — Other Ambulatory Visit: Payer: Self-pay | Admitting: Family Medicine

## 2021-04-17 ENCOUNTER — Other Ambulatory Visit: Payer: Self-pay | Admitting: Family Medicine

## 2021-05-04 ENCOUNTER — Other Ambulatory Visit: Payer: Self-pay | Admitting: Family Medicine

## 2021-05-07 ENCOUNTER — Ambulatory Visit: Payer: Medicare HMO

## 2021-05-07 ENCOUNTER — Other Ambulatory Visit: Payer: Self-pay

## 2021-05-07 DIAGNOSIS — Z0000 Encounter for general adult medical examination without abnormal findings: Secondary | ICD-10-CM

## 2021-05-07 NOTE — Progress Notes (Signed)
Subjective:   Anthony Wolfe is a 75 y.o. male who presents for Medicare Annual/Subsequent preventive examination. I connected with  Anthony Wolfe on 05/07/21 by a telephonic enabled telemedicine application and verified that I am speaking with the correct person using two identifiers.   I discussed the limitations of evaluation and management by telemedicine. The patient expressed understanding and agreed to proceed.  Review of Systems      Deferred to PCP.     Objective:    There were no vitals filed for this visit. There is no height or weight on file to calculate BMI.  Advanced Directives 03/26/2020 06/21/2016 02/16/2016 11/17/2015 08/25/2015 04/06/2015 03/25/2015  Does Patient Have a Medical Advance Directive? Yes Yes Yes Yes Yes Yes Yes  Type of Advance Directive Living will;Healthcare Power of [place];Living will [place];Living will [place];Living will [place];Living will [place];Living will [place];Living will  Does patient want to make changes to medical advance directive? No - Patient declined No - Patient declined No - Patient declined No - Patient declined No - Patient declined No - Patient declined -  Copy of Anthony Wolfe in Chart? - No - copy requested No - copy requested No - copy requested No - copy requested No - copy requested -  Would patient like information on creating a medical advance directive? - - - - - - -    Current Medications (verified) Outpatient Encounter Medications as of 05/07/2021  Medication Sig   albuterol (VENTOLIN HFA) 108 (90 Base) MCG/ACT inhaler INHALE 2 PUFFS BY MOUTH EVERY 6 HOURS AS NEEDED FOR WHEEZING FOR SHORTNESS OF BREATH   atorvastatin (LIPITOR) 80 MG tablet TAKE 1 TABLET BY MOUTH ONCE DAILY AT  6  PM   Budeson-Glycopyrrol-Formoterol (BREZTRI AEROSPHERE) 160-9-4.8 MCG/ACT AERO Inhale into the  lungs.   carvedilol (COREG) 12.5 MG tablet Take 1 tablet by mouth twice daily   clopidogrel (PLAVIX) 75 MG tablet Take 1 tablet by mouth once daily   ENTRESTO 24-26 MG Take 1 tablet by mouth twice daily   furosemide (LASIX) 20 MG tablet Take 1 tablet (20 mg total) by mouth every other day.   meclizine (ANTIVERT) 25 MG tablet Take 1 tablet (25 mg total) by mouth 3 (three) times daily as needed for dizziness.   mexiletine (MEXITIL) 200 MG capsule Take 1 capsule by mouth twice daily   nilotinib (TASIGNA) 150 MG capsule TAKE 2 CAPSULES (300 MG TOTAL) BY MOUTH EVERY 12 (TWELVE) HOURS. TAKE ON AN EMPTY STOMACH, 1 HR BEFORE OR 2 HRS AFTER FOOD.   nitroGLYCERIN (NITROSTAT) 0.4 MG SL tablet Place 1 tablet (0.4 mg total) under the tongue every 5 (five) minutes as needed for chest pain.   ondansetron (ZOFRAN) 4 MG tablet Take 1 tablet (4 mg total) by mouth every 6 (six) hours.   sildenafil (VIAGRA) 100 MG tablet Take 0.5-1 tablets (50-100 mg total) by mouth daily as needed for erectile dysfunction.   No facility-administered encounter medications on file as of 05/07/2021.    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 difficile colitis 06/16/2014   CAD (coronary artery disease)    a.  s/p 3v CABG 2010. b. NSTEMI 05/2014 in setting of VT. Cath impressions: "Recent IMI with occluded SVG to RCA. Has Right to Right collaterals and collaterals from septal and OM. EF 40%."   Carotid disease, bilateral ([place])    a. Right CEA 2002; known occluded left carotid. b. Duplex 06/2014: known occluded LICA, stable 5-64% RICA s/p CEA.   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 Prior EF 36%. b. 2014: 50%. c. 05/2014: EF 35-40% by echo, 40% with inf HK by cath.   NSTEMI (non-ST elevation myocardial infarction) ([place]) 05/2014   Pleural effusion 05/2014   Pneumonia 05/23/2014   Presence of permanent cardiac pacemaker    Prostate hypertrophy    PVC's (premature ventricular contractions)    a. PVC's with prior Holter showing PVC load of 22%.   Shortness of breath dyspnea    Stroke ([place]) 01   TIA (transient ischemic attack)    ASPVD, S./P. right CEA, 2002, and known occluded left carotid   Ventricular tachycardia ([place])    a. Admitted with VT 05/2013 - EPS with inducible sustained monomorphic VT; s/p Medtronic ICD 05/21/14.   Past Surgical History:  Procedure Laterality Date   BACK SURGERY     CAROTID ENDARTERECTOMY Right 01   carotidectomy  2003   right side   COLONOSCOPY  07/2010   CORONARY ARTERY BYPASS GRAFT  02/2009   3 vessel   ELECTROPHYSIOLOGIC STUDY  05/21/14   ELECTROPHYSIOLOGY STUDY N/A 05/21/2014   Procedure: ELECTROPHYSIOLOGY STUDY;  Surgeon: Deboraha Sprang, MD;  Location[facility] CATH LAB;  Service: Cardiovascular;  Laterality: N/A;   EP IMPLANTABLE DEVICE  05/21/14   single chamber Metronic ICD   LEFT HEART CATHETERIZATION WITH CORONARY/GRAFT ANGIOGRAM N/A 05/19/2014   Procedure: LEFT HEART CATHETERIZATION WITH Beatrix Fetters;  Surgeon: Josue Hector, MD;  Location: [facility] CATH LAB;  Service: Cardiovascular;  Laterality: N/A;   PLEURAL BIOPSY N/A 11/27/2014   Procedure: PLEURAL BIOPSY;  Surgeon: [name], MD;  Location: [place];  Service: Thoracic;  Laterality: N/A;   PLEURAL EFFUSION DRAINAGE Right 11/27/2014   Procedure: DRAINAGE OF PLEURAL EFFUSION;  Surgeon: [name], MD;  Location: [place];  Service: Thoracic;  Laterality: Right;   TALC PLEURODESIS Right 11/27/2014   Procedure: Pietro Cassis;  Surgeon: [name], MD;  Location:  [place];  Service: Thoracic;  Laterality: Right;   VIDEO ASSISTED THORACOSCOPY Right 11/27/2014   Procedure: RIGHT VIDEO ASSISTED THORACOSCOPY;  Surgeon: [name], MD;  Location: [place];  Service: Thoracic;  Laterality: Right;   Family History  Problem Relation Age of Onset   Heart disease Mother    Alcohol abuse Father    Social History   Socioeconomic History   Marital status: Married    Spouse name: Not on file   Number of children: 2   Years of education: Not on file   Highest education level: Not on file  Occupational History    Comment: retired Development worker, international aid  Tobacco Use   Smoking status: Former    Packs/day: 3.00    Years: 45.00    Pack years: 135.00    Types: Cigarettes    Quit date: 08/08/1997    Years since quitting: 23.7   Smokeless tobacco: Former  Media planner  Vaping Use: Never used  Substance and Sexual Activity   Alcohol use: No    Alcohol/week: 0.0 standard drinks   Drug use: No   Sexual activity: Yes  Other Topics Concern   Not on file  Social History Narrative   Not on file   Social Determinants of Health   Financial Resource Strain: Low Risk    Difficulty of Paying Living Expenses: Not very hard  Food Insecurity: No Food Insecurity   Worried About Running Out of Food in the Last Year: Never true   Ran Out of Food in the Last Year: Never true  Transportation Needs: No Transportation Needs   Lack of Transportation (Medical): No   Lack of Transportation (Non-Medical): No  Physical Activity: Sufficiently Active   Days of Exercise per Week: 5 days   Minutes of Exercise per Session: 30 min  Stress: No Stress Concern Present   Feeling of Stress : Only a little  Social Connections: Moderately Integrated   Frequency of Communication with Friends and Family: More than three times a week   Frequency of Social Gatherings with Friends and Family: Twice a week   Attends Religious Services: More than 4 times per year   Active Member of [employer] or  Organizations: No   Attends Archivist Meetings: Not on file   Marital Status: Married    Tobacco Counseling Quite 01-23-98   Clinical Intake:   Diabetic?no   Activities of Daily Living No flowsheet data found.  Patient Care Team: [name], MD as PCP - General (Family Medicine) Deboraha Sprang, MD as PCP - Cardiology (Cardiology) [name]., MD as Attending Physician (Cardiology) [name], MD (Family Medicine) [name], MD (Cardiothoracic Surgery) Early, Arvilla Meres, MD as Attending Physician (Vascular Surgery) [name], MD as Consulting Physician (Hematology and Oncology) Deboraha Sprang, MD as Consulting Physician (Cardiology) [name], [facility] as Pharmacist (Pharmacist)  Indicate any recent Medical Services you may have received from other than [facility] providers in the past year (date may be approximate).     Assessment:   This is a routine wellness examination for [place].  Hearing/Vision screen No results found.  Dietary issues and exercise activities discussed:     Goals Addressed   None    Depression Screen PHQ 2/9 Scores 02/18/2021 10/21/2020 03/26/2020 05/31/2018 04/20/2017 11/15/2016 11/03/2016  PHQ - 2 Score 0 0 0 0 0 0 0  PHQ- 9 Score - - - - - 0 0    Fall Risk Fall Risk  02/18/2021 10/21/2020 03/26/2020 05/31/2019 05/31/2018  Falls in the past year? 0 0 0 1 No  Number falls in past yr: 0 - 0 0 -  Injury with Fall? 0 - 0 1 -  Risk for fall due to : No Fall Risks No Fall Risks - - -  Follow up Falls evaluation completed Falls evaluation completed - Falls evaluation completed -    FALL RISK PREVENTION PERTAINING TO THE HOME:  Any stairs in or around the home? No  If so, are there any without handrails? Yes  Home free of loose throw rugs in walkways, pet beds, electrical cords, etc? No  Adequate lighting in your home to reduce risk of falls? Yes   ASSISTIVE DEVICES UTILIZED TO PREVENT FALLS:  Life alert?  No  Use of a cane, walker or w/c? No  Grab bars in the bathroom? Yes Shower chair or bench in shower? No  Elevated toilet seat or  a handicapped toilet? No   TIMED UP AND GO:  Was the test performed? No .  Not performed due to telephonic visit.   Cognitive Function:    Patient able to repeat  Immunizations Immunization History  Administered Date(s) Administered   Fluad Quad(high Dose 65+) 05/14/2019   Influenza, High Dose Seasonal PF 06/22/2018   Influenza,inj,Quad PF,6+ Mos 05/21/2013, 05/07/2014, 04/28/2015, 04/28/2016, 05/09/2017   Pneumococcal Conjugate-13 01/29/2015   Pneumococcal Polysaccharide-23 05/07/2014    TDAP status: Due, Education has been provided regarding the importance of this vaccine. Advised may receive this vaccine at local pharmacy or Health Dept. Aware to provide a copy of the vaccination record if obtained from local pharmacy or Health Dept. Verbalized acceptance and understanding.  Flu Vaccine status: Due, Education has been provided regarding the importance of this vaccine. Advised may receive this vaccine at local pharmacy or Health Dept. Aware to provide a copy of the vaccination record if obtained from local pharmacy or Health Dept. Verbalized acceptance and understanding.  Pneumococcal vaccine status: Up to date  Covid-19 vaccine status: Declined, Education has been provided regarding the importance of this vaccine but patient still declined. Advised may receive this vaccine at local pharmacy or Health Dept.or vaccine clinic. Aware to provide a copy of the vaccination record if obtained from local pharmacy or Health Dept. Verbalized acceptance and understanding.  Qualifies for Shingles Vaccine? No   Zostavax completed No   Shingrix Completed?: No.    Education has been provided regarding the importance of this vaccine. Patient has been advised to call insurance company to determine out of pocket expense if they have not yet received this vaccine. Advised  may also receive vaccine at local pharmacy or Health Dept. Verbalized acceptance and understanding.  Screening Tests Health Maintenance  Topic Date Due   Hepatitis C Screening  Never done   TETANUS/TDAP  Never done   Zoster Vaccines- Shingrix (1 of 2) Never done   INFLUENZA VACCINE  03/08/2021   HPV VACCINES  Aged Out   COVID-19 Vaccine  Discontinued    Health Maintenance  Health Maintenance Due  Topic Date Due   Hepatitis C Screening  Never done   TETANUS/TDAP  Never done   Zoster Vaccines- Shingrix (1 of 2) Never done   INFLUENZA VACCINE  03/08/2021    Colorectal cancer screening: No longer required.   Lung Cancer Screening: (Low Dose CT Chest recommended if Age 34-80 years, 30 pack-year currently smoking OR have quit w/in 15years.) does not qualify.   Lung Cancer Screening Referral: N/a  Additional Screening:  Hepatitis C Screening: does not qualify; Completed   Vision Screening: Recommended annual ophthalmology exams for early detection of glaucoma and other disorders of the eye. Is the patient up to date with their annual eye exam?  Yes   Who is the provider or what is the name of the office in which the patient attends annual eye exams? Dr. Tish Frederickson ([address] in [facility]) If pt is not established with a provider, would they like to be referred to a provider to establish care? No .   Dental Screening: Recommended annual dental exams for proper oral hygiene. Patient wears dentures.   Community Resource Referral / Chronic Care Management: CRR required this visit?  No   CCM required this visit?  No      Plan:     I have personally reviewed and noted the following in the patient's chart:   Medical and social history Use of alcohol,  tobacco or illicit drugs  Current medications and supplements including opioid prescriptions. Patient is not currently taking opioid prescriptions. Functional ability and status Nutritional status Physical activity Advanced  directives List of other physicians Hospitalizations, surgeries, and ER visits in previous 12 months Vitals Screenings to include cognitive, depression, and falls Referrals and appointments  In addition, I have reviewed and discussed with patient certain preventive protocols, quality metrics, and best practice recommendations. A written personalized care plan for preventive services as well as general preventive health recommendations were provided to patient.     Kathrene Alu, RN   05/07/2021   Nurse Notes:  Non Face to Face 22 minutes patient was at home for the length of visit.  Nurse was in office for length of visit. Kathrene Alu RN  Mr. Pitter , Thank you for taking time to come for your Medicare Wellness Visit. I appreciate your ongoing commitment to your health goals. Please review the following plan we discussed and let me know if I can assist you in the future.   These are the goals we discussed: Continue exercise program.  Look into getting a fall device like Life alert Speak with Dr.Pickard about your medication costs   This is a list of the screening recommended for you and due dates:  Health Maintenance  Topic Date Due   Hepatitis C Screening: USPSTF Recommendation to screen - Ages 63-79 yo.  Never done   Tetanus Vaccine  Never done   Zoster (Shingles) Vaccine (1 of 2) Never done   Flu Shot  03/08/2021   HPV Vaccine  Aged Out   COVID-19 Vaccine  Discontinued

## 2021-05-25 ENCOUNTER — Inpatient Hospital Stay: Payer: Medicare HMO | Attending: Hematology and Oncology

## 2021-05-25 ENCOUNTER — Other Ambulatory Visit: Payer: Self-pay

## 2021-05-25 DIAGNOSIS — D6481 Anemia due to antineoplastic chemotherapy: Secondary | ICD-10-CM | POA: Diagnosis not present

## 2021-05-25 DIAGNOSIS — R197 Diarrhea, unspecified: Secondary | ICD-10-CM | POA: Insufficient documentation

## 2021-05-25 DIAGNOSIS — C9210 Chronic myeloid leukemia, BCR/ABL-positive, not having achieved remission: Secondary | ICD-10-CM | POA: Diagnosis not present

## 2021-05-25 LAB — CBC WITH DIFFERENTIAL/PLATELET
Abs Immature Granulocytes: 0.03 10*3/uL (ref 0.00–0.07)
Basophils Absolute: 0.1 10*3/uL (ref 0.0–0.1)
Basophils Relative: 1 %
Eosinophils Absolute: 0.1 10*3/uL (ref 0.0–0.5)
Eosinophils Relative: 2 %
HCT: 34.4 % — ABNORMAL LOW (ref 39.0–52.0)
Hemoglobin: 11.5 g/dL — ABNORMAL LOW (ref 13.0–17.0)
Immature Granulocytes: 0 %
Lymphocytes Relative: 14 %
Lymphs Abs: 1 10*3/uL (ref 0.7–4.0)
MCH: 31.2 pg (ref 26.0–34.0)
MCHC: 33.4 g/dL (ref 30.0–36.0)
MCV: 93.2 fL (ref 80.0–100.0)
Monocytes Absolute: 0.6 10*3/uL (ref 0.1–1.0)
Monocytes Relative: 9 %
Neutro Abs: 5.3 10*3/uL (ref 1.7–7.7)
Neutrophils Relative %: 74 %
Platelets: 249 10*3/uL (ref 150–400)
RBC: 3.69 MIL/uL — ABNORMAL LOW (ref 4.22–5.81)
RDW: 14.7 % (ref 11.5–15.5)
WBC: 7.1 10*3/uL (ref 4.0–10.5)
nRBC: 0 % (ref 0.0–0.2)

## 2021-05-25 LAB — COMPREHENSIVE METABOLIC PANEL
ALT: 11 U/L (ref 0–44)
AST: 12 U/L — ABNORMAL LOW (ref 15–41)
Albumin: 3.5 g/dL (ref 3.5–5.0)
Alkaline Phosphatase: 107 U/L (ref 38–126)
Anion gap: 10 (ref 5–15)
BUN: 13 mg/dL (ref 8–23)
CO2: 24 mmol/L (ref 22–32)
Calcium: 9 mg/dL (ref 8.9–10.3)
Chloride: 109 mmol/L (ref 98–111)
Creatinine, Ser: 0.88 mg/dL (ref 0.61–1.24)
GFR, Estimated: >60 mL/min (ref >60)
Glucose, Bld: 98 mg/dL (ref 70–99)
Potassium: 4 mmol/L (ref 3.5–5.1)
Sodium: 143 mmol/L (ref 135–145)
Total Bilirubin: 0.5 mg/dL (ref 0.3–1.2)
Total Protein: 7 g/dL (ref 6.5–8.1)

## 2021-05-31 ENCOUNTER — Ambulatory Visit (INDEPENDENT_AMBULATORY_CARE_PROVIDER_SITE_OTHER): Payer: Medicare HMO

## 2021-05-31 DIAGNOSIS — I255 Ischemic cardiomyopathy: Secondary | ICD-10-CM | POA: Diagnosis not present

## 2021-06-01 LAB — CUP PACEART REMOTE DEVICE CHECK
Battery Remaining Longevity: 48 mo
Battery Voltage: 2.97 V
Brady Statistic RV Percent Paced: 0.07 %
Date Time Interrogation Session: 20221024001802
HighPow Impedance: 55 Ohm
Implantable Lead Implant Date: 20151014
Implantable Lead Location: 753860
Implantable Pulse Generator Implant Date: 20151014
Lead Channel Impedance Value: 361 Ohm
Lead Channel Impedance Value: 418 Ohm
Lead Channel Pacing Threshold Amplitude: 0.75 V
Lead Channel Pacing Threshold Pulse Width: 0.4 ms
Lead Channel Sensing Intrinsic Amplitude: 7.875 mV
Lead Channel Sensing Intrinsic Amplitude: 7.875 mV
Lead Channel Setting Pacing Amplitude: 2 V
Lead Channel Setting Pacing Pulse Width: 0.4 ms
Lead Channel Setting Sensing Sensitivity: 0.3 mV

## 2021-06-01 LAB — BCR/ABL

## 2021-06-03 ENCOUNTER — Other Ambulatory Visit: Payer: Self-pay

## 2021-06-03 ENCOUNTER — Ambulatory Visit (INDEPENDENT_AMBULATORY_CARE_PROVIDER_SITE_OTHER): Payer: Medicare HMO | Admitting: Family Medicine

## 2021-06-03 ENCOUNTER — Other Ambulatory Visit: Payer: Self-pay | Admitting: Family Medicine

## 2021-06-03 ENCOUNTER — Encounter: Payer: Self-pay | Admitting: Family Medicine

## 2021-06-03 VITALS — BP 128/66 | HR 66 | Temp 98.3°F | Resp 18 | Ht 69.0 in | Wt 181.0 lb

## 2021-06-03 DIAGNOSIS — J449 Chronic obstructive pulmonary disease, unspecified: Secondary | ICD-10-CM | POA: Diagnosis not present

## 2021-06-03 DIAGNOSIS — Z8679 Personal history of other diseases of the circulatory system: Secondary | ICD-10-CM

## 2021-06-03 DIAGNOSIS — I509 Heart failure, unspecified: Secondary | ICD-10-CM | POA: Diagnosis not present

## 2021-06-03 DIAGNOSIS — I429 Cardiomyopathy, unspecified: Secondary | ICD-10-CM

## 2021-06-03 DIAGNOSIS — I10 Essential (primary) hypertension: Secondary | ICD-10-CM

## 2021-06-03 MED ORDER — CITALOPRAM HYDROBROMIDE 20 MG PO TABS
20.0000 mg | ORAL_TABLET | Freq: Every day | ORAL | 11 refills | Status: DC
Start: 2021-06-03 — End: 2021-07-06

## 2021-06-03 NOTE — Progress Notes (Signed)
Subjective:    Patient ID: Anthony Wolfe, male    DOB: 10-Nov-1945, 75 y.o.   MRN: [id number]  He has a history of coronary artery disease status post three-vessel CABG in 2010.  He also had a non-ST elevation myocardial infarction in 2015.  He also has ischemic cardiomyopathy with an ejection fraction of 40-45%.  Also has a h/o TIA, CML, and COPD.  He is currently on a combination of carvedilol, Lasix, and Entresto for his congestive heart failure.  He also has a history of COPD.  Patient is having a difficult time affording Entresto.  It is costing him almost $200 a month.  On his fixed income, this is extremely difficult for him.  Therefore he is asking to switch back to valsartan alone.  He also is reporting anxiety.  He states that he is having a difficult time sleeping.  He paces the floor at night.  He finds himself anxious and worried constantly sometimes for no reason.  He also reports depression.  In the past he took Celexa with good success Past Medical History:  Diagnosis Date   AICD (automatic cardioverter/defibrillator) present    Allergic rhinitis    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 difficile colitis 06/16/2014   CAD (coronary artery disease)    a. s/p 3v CABG 2010. b. NSTEMI 05/2014 in setting of VT. Cath impressions: "Recent IMI with occluded SVG to RCA. Has Right to Right collaterals and collaterals from septal and OM. EF 40%."   Carotid disease, bilateral ([place])    a. Right CEA 2002; known occluded left carotid. b. Duplex 06/2014: known occluded LICA, stable 8-12% RICA s/p CEA.   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 Prior EF 36%. b. 2014: 50%. c. 05/2014: EF 35-40% by echo, 40% with inf HK by cath.   NSTEMI (non-ST elevation myocardial  infarction) ([place]) 05/2014   Pleural effusion 05/2014   Pneumonia 05/23/2014   Presence of permanent cardiac pacemaker    Prostate hypertrophy    PVC's (premature ventricular contractions)    a. PVC's with prior Holter showing PVC load of 22%.   Shortness of breath dyspnea    Stroke ([place]) 01   TIA (transient ischemic attack)    ASPVD, S./P. right CEA, 2002, and known occluded left carotid   Ventricular tachycardia ([place])    a. Admitted with VT 05/2013 - EPS with inducible sustained monomorphic VT; s/p Medtronic ICD 05/21/14.   Past Surgical History:  Procedure Laterality Date   BACK SURGERY     CAROTID ENDARTERECTOMY Right 01   carotidectomy  2003   right side   COLONOSCOPY  07/2010   CORONARY ARTERY BYPASS GRAFT  02/2009   3 vessel   ELECTROPHYSIOLOGIC STUDY  05/21/14   ELECTROPHYSIOLOGY STUDY N/A 05/21/2014   Procedure: ELECTROPHYSIOLOGY STUDY;  Surgeon: Deboraha Sprang, MD;  Location[facility] CATH LAB;  Service: Cardiovascular;  Laterality: N/A;   EP IMPLANTABLE DEVICE  05/21/14   single chamber Metronic ICD   LEFT HEART CATHETERIZATION WITH CORONARY/GRAFT ANGIOGRAM N/A 05/19/2014   Procedure: LEFT HEART CATHETERIZATION WITH Beatrix Fetters;  Surgeon: Josue Hector, MD;  Location: [facility] CATH LAB;  Service: Cardiovascular;  Laterality: N/A;  PLEURAL BIOPSY N/A 11/27/2014   Procedure: PLEURAL BIOPSY;  Surgeon: [name], MD;  Location: [place];  Service: Thoracic;  Laterality: N/A;   PLEURAL EFFUSION DRAINAGE Right 11/27/2014   Procedure: DRAINAGE OF PLEURAL EFFUSION;  Surgeon: [name], MD;  Location: [place];  Service: Thoracic;  Laterality: Right;   TALC PLEURODESIS Right 11/27/2014   Procedure: Pietro Cassis;  Surgeon: [name], MD;  Location: [place];  Service: Thoracic;  Laterality: Right;   VIDEO ASSISTED THORACOSCOPY Right 11/27/2014   Procedure: RIGHT VIDEO ASSISTED THORACOSCOPY;  Surgeon: [name], MD;  Location: [place];  Service:  Thoracic;  Laterality: Right;   Current Outpatient Medications on File Prior to Visit  Medication Sig Dispense Refill   albuterol (VENTOLIN HFA) 108 (90 Base) MCG/ACT inhaler INHALE 2 PUFFS BY MOUTH EVERY 6 HOURS AS NEEDED FOR WHEEZING FOR SHORTNESS OF BREATH 18 g 0   atorvastatin (LIPITOR) 80 MG tablet TAKE 1 TABLET BY MOUTH ONCE DAILY AT  6  PM 90 tablet 0   Budeson-Glycopyrrol-Formoterol (BREZTRI AEROSPHERE) 160-9-4.8 MCG/ACT AERO Inhale into the lungs.     carvedilol (COREG) 12.5 MG tablet Take 1 tablet by mouth twice daily 180 tablet 2   clopidogrel (PLAVIX) 75 MG tablet Take 1 tablet by mouth once daily 90 tablet 0   ENTRESTO 24-26 MG Take 1 tablet by mouth twice daily 60 tablet 0   furosemide (LASIX) 20 MG tablet Take 1 tablet (20 mg total) by mouth every other day. 45 tablet 3   meclizine (ANTIVERT) 25 MG tablet Take 1 tablet (25 mg total) by mouth 3 (three) times daily as needed for dizziness. 30 tablet 0   mexiletine (MEXITIL) 200 MG capsule Take 1 capsule by mouth twice daily 60 capsule 3   nilotinib (TASIGNA) 150 MG capsule TAKE 2 CAPSULES (300 MG TOTAL) BY MOUTH EVERY 12 (TWELVE) HOURS. TAKE ON AN EMPTY STOMACH, 1 HR BEFORE OR 2 HRS AFTER FOOD. 112 capsule 10   nitroGLYCERIN (NITROSTAT) 0.4 MG SL tablet Place 1 tablet (0.4 mg total) under the tongue every 5 (five) minutes as needed for chest pain. 25 tablet 4   ondansetron (ZOFRAN) 4 MG tablet Take 1 tablet (4 mg total) by mouth every 6 (six) hours. 12 tablet 0   sildenafil (VIAGRA) 100 MG tablet Take 0.5-1 tablets (50-100 mg total) by mouth daily as needed for erectile dysfunction. 5 tablet 11   No current facility-administered medications on file prior to visit.   Allergies  Allergen Reactions   Norvasc [Amlodipine Besylate] Rash    rash   Sulfa Antibiotics Hives    Hives    Social History   Socioeconomic History   Marital status: Married    Spouse name: Not on file   Number of children: 2   Years of education: Not on  file   Highest education level: Not on file  Occupational History    Comment: retired Development worker, international aid  Tobacco Use   Smoking status: Former    Packs/day: 3.00    Years: 45.00    Pack years: 135.00    Types: Cigarettes    Quit date: 08/08/1997    Years since quitting: 23.8   Smokeless tobacco: Former  Scientific laboratory technician Use: Never used  Substance and Sexual Activity   Alcohol use: No    Alcohol/week: 0.0 standard drinks   Drug use: No   Sexual activity: Yes  Other Topics Concern   Not on file  Social  History Narrative   Not on file   Social Determinants of Health   Financial Resource Strain: Low Risk    Difficulty of Paying Living Expenses: Not very hard  Food Insecurity: No Food Insecurity   Worried About Running Out of Food in the Last Year: Never true   Ran Out of Food in the Last Year: Never true  Transportation Needs: No Transportation Needs   Lack of Transportation (Medical): No   Lack of Transportation (Non-Medical): No  Physical Activity: Sufficiently Active   Days of Exercise per Week: 5 days   Minutes of Exercise per Session: 30 min  Stress: No Stress Concern Present   Feeling of Stress : Only a little  Social Connections: Moderately Integrated   Frequency of Communication with Friends and Family: More than three times a week   Frequency of Social Gatherings with Friends and Family: Twice a week   Attends Religious Services: More than 4 times per year   Active Member of [employer] or Organizations: No   Attends Music therapist: Not on file   Marital Status: Married  Human resources officer Violence: Not At Risk   Fear of Current or Ex-Partner: No   Emotionally Abused: No   Physically Abused: No   Sexually Abused: No      Review of Systems  Gastrointestinal:  Positive for diarrhea.  All other systems reviewed and are negative.     Objective:   Physical Exam Vitals reviewed.  Constitutional:      Appearance: He is well-developed.  Cardiovascular:      Rate and Rhythm: Normal rate and regular rhythm.     Heart sounds: Normal heart sounds. No murmur heard. Pulmonary:     Effort: Pulmonary effort is normal. No respiratory distress.     Breath sounds: Normal breath sounds. No wheezing or rales.  Abdominal:     General: Bowel sounds are normal. There is no distension.     Palpations: Abdomen is soft. There is no mass.     Tenderness: There is no abdominal tenderness. There is no guarding or rebound.          Assessment & Plan:  History of ASCVD  Congestive heart failure with cardiomyopathy ([place])  Benign essential HTN  Chronic obstructive pulmonary disease, unspecified COPD type [facility]) Patient had a CBC and a CMP checked October 18 with another physician.  I reviewed these with him today and aside from some mild anemia of chronic disease, his lab work looks excellent.  His blood pressure today is outstanding.  My biggest concern is keeping him on the [place].  Therefore I gave him samples to last a month and I will set him up to meet with our clinical pharmacist to see if he can qualify for any prescription assistance programs.  If not we will transition him back to valsartan.  Start the patient on Celexa 20 mg a day

## 2021-06-04 ENCOUNTER — Telehealth: Payer: Self-pay | Admitting: Family Medicine

## 2021-06-04 ENCOUNTER — Encounter: Payer: Self-pay | Admitting: Hematology and Oncology

## 2021-06-04 ENCOUNTER — Other Ambulatory Visit: Payer: Self-pay | Admitting: *Deleted

## 2021-06-04 ENCOUNTER — Inpatient Hospital Stay: Payer: Medicare HMO | Admitting: Hematology and Oncology

## 2021-06-04 VITALS — BP 152/66 | HR 67 | Temp 98.0°F | Resp 18 | Ht 69.0 in | Wt 182.0 lb

## 2021-06-04 DIAGNOSIS — D6481 Anemia due to antineoplastic chemotherapy: Secondary | ICD-10-CM | POA: Diagnosis not present

## 2021-06-04 DIAGNOSIS — R197 Diarrhea, unspecified: Secondary | ICD-10-CM | POA: Diagnosis not present

## 2021-06-04 DIAGNOSIS — C9210 Chronic myeloid leukemia, BCR/ABL-positive, not having achieved remission: Secondary | ICD-10-CM

## 2021-06-04 DIAGNOSIS — T451X5A Adverse effect of antineoplastic and immunosuppressive drugs, initial encounter: Secondary | ICD-10-CM

## 2021-06-04 DIAGNOSIS — R110 Nausea: Secondary | ICD-10-CM

## 2021-06-04 DIAGNOSIS — K521 Toxic gastroenteritis and colitis: Secondary | ICD-10-CM | POA: Diagnosis not present

## 2021-06-04 MED ORDER — DIPHENOXYLATE-ATROPINE 2.5-0.025 MG PO TABS: 1.0000 | ORAL_TABLET | Freq: Four times a day (QID) | ORAL | 0 refills | Status: DC | PRN

## 2021-06-04 MED ORDER — ONDANSETRON HCL 4 MG PO TABS: 4.0000 mg | ORAL_TABLET | Freq: Three times a day (TID) | ORAL | 3 refills | Status: DC | PRN

## 2021-06-04 MED ORDER — ALBUTEROL SULFATE HFA 108 (90 BASE) MCG/ACT IN AERS: INHALATION_SPRAY | RESPIRATORY_TRACT | 11 refills | Status: DC

## 2021-06-04 NOTE — Assessment & Plan Note (Signed)
This is likely anemia of chronic disease. The patient denies recent history of bleeding such as epistaxis, hematuria or hematochezia. He is asymptomatic from the anemia. We will observe for now.  

## 2021-06-04 NOTE — Assessment & Plan Note (Signed)
BCR/ABL is minimally detected He is still in major molecular response Since our last discussion, he is compliant taking his medication as prescribed I plan to see him back in 35months for further follow-up

## 2021-06-04 NOTE — Chronic Care Management (AMB) (Signed)
  Chronic Care Management   Note  06/04/2021 Name: SHARON STAPEL MRN: [id number] DOB: [birth year]  Latricia Heft Maya is a 75 y.o. year old male who is a primary care patient of Dennard Schaumann, Cammie Mcgee, MD. I reached out to [name] by phone today in response to a referral sent by Mr. Orvin Netter Spampinato's PCP, [name], MD.   Mr. Mort was given information about Chronic Care Management services today including:  CCM service includes personalized support from designated clinical staff supervised by his physician, including individualized plan of care and coordination with other care providers 24/7 contact phone numbers for assistance for urgent and routine care needs. Service will only be billed when office clinical staff spend 20 minutes or more in a month to coordinate care. Only one practitioner may furnish and bill the service in a calendar month. The patient may stop CCM services at any time (effective at the end of the month) by phone call to the office staff.   Patient agreed to services and verbal consent obtained.   Follow up plan:   Tatjana Secretary/administrator

## 2021-06-04 NOTE — Assessment & Plan Note (Signed)
He has mild intermittent severe diarrhea I refilled his prescription of Lomotil to take as needed

## 2021-06-04 NOTE — Progress Notes (Signed)
[place] OFFICE PROGRESS NOTE  Patient Care Team: [name], MD as PCP - General (Family Medicine) Deboraha Sprang, MD as PCP - Cardiology (Cardiology) [name]., MD as Attending Physician (Cardiology) [name], MD (Family Medicine) [name], MD (Cardiothoracic Surgery) Early, Arvilla Meres, MD as Attending Physician (Vascular Surgery) [name], MD as Consulting Physician (Hematology and Oncology) Deboraha Sprang, MD as Consulting Physician (Cardiology) [name], [facility] as Pharmacist (Pharmacist) [name], [facility] as Pharmacist (Pharmacist)  ASSESSMENT & PLAN:  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 The patient denies recent history of bleeding such as epistaxis, hematuria or hematochezia. He is asymptomatic from the anemia. We will observe for now.  Orders Placed This Encounter  Procedures   CBC with Differential/Platelet    Standing Status:   Standing    Number of Occurrences:   22    Standing Expiration Date:   06/04/2022   Comprehensive metabolic panel    Standing Status:   Standing    Number of Occurrences:   33    Standing Expiration Date:   06/04/2022   BCR-ABL    With RT-PCR technique    Standing Status:   Standing    Number of Occurrences:   22    Standing Expiration Date:   06/04/2022    All questions were answered. The patient knows to call the clinic with any problems, questions or concerns. The total time spent in the appointment was 20 minutes encounter with patients including review of chart and  various tests results, discussions about plan of care and coordination of care plan   NHeath Lark MD 06/04/2021 10:45 AM  INTERVAL HISTORY: Please see below for problem oriented charting. he returns for treatment follow-up on Tasigna for CML He returns with his wife for further follow-up He is compliant taking medications as directed He has intermittent diarrhea that comes and goes No recent shortness of breath or exacerbation of congestive heart failure No recent infection  REVIEW OF SYSTEMS:   Constitutional: Denies fevers, chills or abnormal weight loss Eyes: Denies blurriness of vision Ears, nose, mouth, throat, and face: Denies mucositis or sore throat Respiratory: Denies cough, dyspnea or wheezes Cardiovascular: Denies palpitation, chest discomfort or lower extremity swelling Skin: Denies abnormal skin rashes Lymphatics: Denies new lymphadenopathy or easy bruising Neurological:Denies numbness, tingling or new weaknesses Behavioral/Psych: Mood is stable, no new changes  All other systems were reviewed with the patient and are negative.  I have reviewed the past medical history, past surgical history, social history and family history with the patient and they are unchanged from previous note.  ALLERGIES:  is allergic to breztri aerosphere [budeson-glycopyrrol-formoterol], norvasc [amlodipine besylate], and sulfa antibiotics.  MEDICATIONS:  Current Outpatient Medications  Medication Sig Dispense Refill   diphenoxylate-atropine (LOMOTIL) 2.5-0.025 MG tablet Take 1 tablet by mouth 4 (four) times daily as needed for diarrhea or loose stools. 30 tablet 0   albuterol (VENTOLIN HFA) 108 (90 Base) MCG/ACT inhaler INHALE 2 PUFFS BY MOUTH EVERY 6 HOURS AS NEEDED FOR WHEEZING FOR SHORTNESS OF BREATH 18 g 0  atorvastatin (LIPITOR) 80 MG tablet TAKE 1 TABLET BY MOUTH ONCE DAILY AT  6  PM 90 tablet 0   carvedilol (COREG) 12.5 MG tablet Take 1 tablet by mouth twice daily 180 tablet 2    citalopram (CELEXA) 20 MG tablet Take 1 tablet (20 mg total) by mouth daily. 30 tablet 11   clopidogrel (PLAVIX) 75 MG tablet Take 1 tablet by mouth once daily 90 tablet 0   ENTRESTO 24-26 MG Take 1 tablet by mouth twice daily 60 tablet 0   furosemide (LASIX) 20 MG tablet Take 1 tablet (20 mg total) by mouth every other day. 45 tablet 3   meclizine (ANTIVERT) 25 MG tablet Take 1 tablet (25 mg total) by mouth 3 (three) times daily as needed for dizziness. 30 tablet 0   mexiletine (MEXITIL) 200 MG capsule Take 1 capsule by mouth twice daily 60 capsule 0   nilotinib (TASIGNA) 150 MG capsule TAKE 2 CAPSULES (300 MG TOTAL) BY MOUTH EVERY 12 (TWELVE) HOURS. TAKE ON AN EMPTY STOMACH, 1 HR BEFORE OR 2 HRS AFTER FOOD. 112 capsule 10   nitroGLYCERIN (NITROSTAT) 0.4 MG SL tablet Place 1 tablet (0.4 mg total) under the tongue every 5 (five) minutes as needed for chest pain. 25 tablet 4   ondansetron (ZOFRAN) 4 MG tablet Take 1 tablet (4 mg total) by mouth every 8 (eight) hours as needed for nausea or vomiting. 30 tablet 3   sildenafil (VIAGRA) 100 MG tablet Take 0.5-1 tablets (50-100 mg total) by mouth daily as needed for erectile dysfunction. 5 tablet 11   No current facility-administered medications for this visit.    SUMMARY OF ONCOLOGIC HISTORY: Oncology History  Chronic myelogenous leukemia ([place])  05/04/2012 Bone Marrow Biopsy   BM confirmed diagnosis of CML in Chronic phase. BCR/ABL by PCR detected abnormalitis with b2a2 & b3a2 subtypes   05/09/2012 - 03/16/2015 Chemotherapy   He was started on treatment with Dasatinib 100 mg daily   06/06/2012 Adverse Reaction   Dose of medication was reduced to 50 mg daily due to pancytopenia   01/24/2013 Progression   Patient was noted to have elevated blood count which and was subsequently found to be noncompliant to treatment. The patient has not been on treatment for several months because his prescription ran out. He was restarted back on treatment    01/16/2014 Progression   Bcr/ABL by PCR is worse. Dose of Dasatinib was increased to 100 mg.   02/26/2014 Tumor Marker   Blood work for ABL kinase mutation was negative.   10/29/2014 Tumor Marker   BCR/ABL b2a2 & b3a2 0.29%, IS 0.1624%, not in MMR yet but improving   11/05/2014 Adverse Reaction   He had thoracentesis due to pleural effusion   11/27/2014 Surgery   He underwent right video-assisted thoracoscopy, Drainage of pleural effusion, Pleural biopsy, Diaphragm biopsy, Lung biopsy & [place] pleurodesis   01/30/2015 Tumor Marker   BCR/ABL b2a2 & b3a2 0.78%, IS 0.4368%, not in MMR    03/11/2015 Pathology Results   BCR/ABL b2a2 0.19%, IS 0.1064%, not in MMR    03/16/2015 Adverse Reaction   Dasatinib was stopped due to recurrent pleural effusion   03/28/2015 - 11/06/2017 Chemotherapy   He started on Bosutinib   03/30/2015 Procedure   He has therapeutic thoracentesis of the right lung with 1 liter of fluid removed   03/30/2015 Adverse Reaction   Bosutinib is placed on hold, to be restart on 8/25 at 250 mg due to severe diarrhea   04/28/2015  Tumor Marker   BCR/ABL b2a2 0.06%, IS 0.0336%, in MMR    06/10/2015 Tumor Marker   BCR/ABL b2a2 0.14%, IS 0.1162%, not in MMR    08/14/2015 Pathology Results   BCR/ABL b2a2 0.02%, IS 0.0166%, In MMR    11/09/2015 Tumor Marker   BCR/ABL b2a2 0.005%, IS 0.0042%, In MMR    02/08/2016 Tumor Marker   BCR/ABL undetectable. In MMR   06/13/2016 Tumor Marker   BCR/ABL undetectable. In MMR   12/06/2016 Pathology Results   BCR/ABL undetectable. In MMR   06/08/2017 Pathology Results   BCR/ABL undetectable. In MMR   09/08/2017 Pathology Results   BCR/ABL undetectable. In MMR   12/11/2017 Pathology Results   BCR/ABL undetectable. In MMR   03/19/2018 Pathology Results   BCR/ABL undetectable. In MMR   06/12/2018 Pathology Results   BCR/ABL undetectable. In MMR   09/06/2018 Pathology Results   BCR ABL is detectable. e13a2 (b2a2) and [id number]) IS: 0.009%    09/26/2018 -  Chemotherapy   The patient had Tasigna    11/21/2018 Pathology Results   BCR/ABL undetectable. In MMR   03/07/2019 Pathology Results   BCR/ABL undetectable. In MMR   06/17/2019 Pathology Results   BCR/ABL undetectable. In MMR   10/14/2019 Pathology Results   BCR ABL is detectable. e13a2 (b2a2) and F35K5(G2B6) IS: 0.0281%. In MMR   01/14/2020 Pathology Results   BCR ABL is detectable. e13a2 (b2a2) and L89H7(D4K8) IS: 0.023%. In MMR   10/23/2020 Pathology Results   BCR ABL is detectable. e13a2 (b2a2) and J68T1(X7W6) IS: 0.0643%. In MMR   01/22/2021 Pathology Results   BCR ABL is detectable. e13a2 (b2a2) and O03T5(H7C1) IS: 0.027%. In MMR   05/25/2021 Pathology Results   BCR ABL is detectable. e13a2 (b2a2) and U38G5(X6I6) IS: 0.0091%. In MMR     PHYSICAL EXAMINATION: ECOG PERFORMANCE STATUS: 1 - Symptomatic but completely ambulatory  Vitals:   06/04/21 0844  BP: (!) 152/66  Pulse: 67  Resp: 18  Temp: 98 F (36.7 C)  SpO2: 100%   Filed Weights   06/04/21 0844  Weight: 182 lb (82.6 kg)    GENERAL:alert, no distress and comfortable SKIN: skin color, texture, turgor are normal, no rashes or significant lesions EYES: normal, Conjunctiva are pink and non-injected, sclera clear OROPHARYNX:no exudate, no erythema and lips, buccal mucosa, and tongue normal  NECK: supple, thyroid normal size, non-tender, without nodularity LYMPH:  no palpable lymphadenopathy in the cervical, axillary or inguinal LUNGS: clear to auscultation and percussion with normal breathing effort HEART: regular rate & rhythm and no murmurs and no lower extremity edema ABDOMEN:abdomen soft, non-tender and normal bowel sounds Musculoskeletal:no cyanosis of digits and no clubbing  NEURO: alert & oriented x 3 with fluent speech, no focal motor/sensory deficits  LABORATORY DATA:  I have reviewed the data as listed    Component Value Date/Time   NA 143 05/25/2021 0841   NA 142 06/08/2017 0832    K 4.0 05/25/2021 0841   K 3.9 06/08/2017 0832   CL 109 05/25/2021 0841   CL 110 (H) 11/14/2012 0918   CO2 24 05/25/2021 0841   CO2 25 06/08/2017 0832   GLUCOSE 98 05/25/2021 0841   GLUCOSE 98 06/08/2017 0832   GLUCOSE 108 (H) 11/14/2012 0918   BUN 13 05/25/2021 0841   BUN 11.6 06/08/2017 0832   CREATININE 0.88 05/25/2021 0841   CREATININE 1.13 01/22/2021 0840   CREATININE 0.90 05/31/2019 0837   CREATININE 0.9 06/08/2017 0832   CALCIUM 9.0 05/25/2021 0841  CALCIUM 8.6 06/08/2017 0832   PROT 7.0 05/25/2021 0841   PROT 6.7 06/08/2017 0832   ALBUMIN 3.5 05/25/2021 0841   ALBUMIN 3.4 (L) 06/08/2017 0832   AST 12 (L) 05/25/2021 0841   AST 13 (L) 01/22/2021 0840   AST 13 06/08/2017 0832   ALT 11 05/25/2021 0841   ALT 14 01/22/2021 0840   ALT 7 06/08/2017 0832   ALKPHOS 107 05/25/2021 0841   ALKPHOS 96 06/08/2017 0832   BILITOT 0.5 05/25/2021 0841   BILITOT 0.3 01/22/2021 0840   BILITOT 0.44 06/08/2017 0832   GFRNONAA >60 05/25/2021 0841   GFRNONAA >60 01/22/2021 0840   GFRNONAA 83 05/31/2019 0837   GFRAA >60 04/24/2020 0842   GFRAA 96 05/31/2019 0837    No results found for: SPEP, UPEP  Lab Results  Component Value Date   WBC 7.1 05/25/2021   NEUTROABS 5.3 05/25/2021   HGB 11.5 (L) 05/25/2021   HCT 34.4 (L) 05/25/2021   MCV 93.2 05/25/2021   PLT 249 05/25/2021      Chemistry      Component Value Date/Time   NA 143 05/25/2021 0841   NA 142 06/08/2017 0832   K 4.0 05/25/2021 0841   K 3.9 06/08/2017 0832   CL 109 05/25/2021 0841   CL 110 (H) 11/14/2012 0918   CO2 24 05/25/2021 0841   CO2 25 06/08/2017 0832   BUN 13 05/25/2021 0841   BUN 11.6 06/08/2017 0832   CREATININE 0.88 05/25/2021 0841   CREATININE 1.13 01/22/2021 0840   CREATININE 0.90 05/31/2019 0837   CREATININE 0.9 06/08/2017 0832      Component Value Date/Time   CALCIUM 9.0 05/25/2021 0841   CALCIUM 8.6 06/08/2017 0832   ALKPHOS 107 05/25/2021 0841   ALKPHOS 96 06/08/2017 0832   AST 12 (L)  05/25/2021 0841   AST 13 (L) 01/22/2021 0840   AST 13 06/08/2017 0832   ALT 11 05/25/2021 0841   ALT 14 01/22/2021 0840   ALT 7 06/08/2017 0832   BILITOT 0.5 05/25/2021 0841   BILITOT 0.3 01/22/2021 0840   BILITOT 0.44 06/08/2017 3491

## 2021-06-08 NOTE — Progress Notes (Signed)
Remote ICD transmission.   

## 2021-06-29 ENCOUNTER — Other Ambulatory Visit: Payer: Self-pay | Admitting: Family Medicine

## 2021-06-29 ENCOUNTER — Other Ambulatory Visit: Payer: Self-pay

## 2021-06-29 DIAGNOSIS — C9210 Chronic myeloid leukemia, BCR/ABL-positive, not having achieved remission: Secondary | ICD-10-CM

## 2021-06-29 MED ORDER — NILOTINIB HCL 150 MG PO CAPS: ORAL_CAPSULE | ORAL | 10 refills | Status: DC

## 2021-06-29 MED ORDER — NILOTINIB HCL 150 MG PO CAPS: ORAL_CAPSULE | ORAL | 12 refills | Status: DC

## 2021-07-01 ENCOUNTER — Other Ambulatory Visit: Payer: Self-pay | Admitting: Family Medicine

## 2021-07-06 ENCOUNTER — Telehealth: Payer: Self-pay

## 2021-07-06 ENCOUNTER — Other Ambulatory Visit: Payer: Self-pay

## 2021-07-06 NOTE — Telephone Encounter (Signed)
Pt's wife called to advise pt is refusing to take Citalopram. She states pt does not think he needs it and is refusing to take it at all. She just wanted to make [place] aware and make sure this is removed from his medication list. Meds updated.  FYI, Thanks!

## 2021-07-07 ENCOUNTER — Other Ambulatory Visit: Payer: Self-pay | Admitting: *Deleted

## 2021-07-07 ENCOUNTER — Other Ambulatory Visit: Payer: Self-pay | Admitting: Family Medicine

## 2021-07-09 ENCOUNTER — Telehealth: Payer: Self-pay | Admitting: Pharmacist

## 2021-07-09 NOTE — Chronic Care Management (AMB) (Signed)
Chronic Care Management Pharmacy Assistant   Name: Anthony Wolfe  MRN: [id number] DOB: 09-20-1945   Reason for Encounter: Chart Review For Initial Visit With Clinical Pharmacist   Conditions to be addressed/monitored: CAD, CHF, HTN,  COPD, HLD, ICD, Anemia, ED  Primary concerns for visit include: HTN, HLD  Recent office visits:  06/03/2021 Adele Dan, MD;  My biggest concern is keeping him on the [facility]  Therefore I gave him samples to last a month and I will set him up to meet with our clinical pharmacist to see if he can qualify for any prescription assistance programs.  If not we will transition him back to valsartan.  Start the patient on Celexa 20 mg a day  02/18/2021 OV (PCP) [name], MD;  I have asked the patient to check his blood pressure at home and if his systolic blood pressures consistently in the 140s I would like to increase the strength of the Entresto.  Recent consult visits:  02/01/2021 OV (Oncology) [name], MD; no medication changes indicated.  Hospital visits:  None in previous 6 months  Medications: Outpatient Encounter Medications as of 07/09/2021  Medication Sig   albuterol (VENTOLIN HFA) 108 (90 Base) MCG/ACT inhaler INHALE 2 PUFFS BY MOUTH EVERY 6 HOURS AS NEEDED FOR WHEEZING FOR SHORTNESS OF BREATH   atorvastatin (LIPITOR) 80 MG tablet TAKE 1 TABLET BY MOUTH ONCE DAILY AT 6PM   carvedilol (COREG) 12.5 MG tablet Take 1 tablet by mouth twice daily   clopidogrel (PLAVIX) 75 MG tablet Take 1 tablet by mouth once daily   diphenoxylate-atropine (LOMOTIL) 2.5-0.025 MG tablet Take 1 tablet by mouth 4 (four) times daily as needed for diarrhea or loose stools.   ENTRESTO 24-26 MG Take 1 tablet by mouth twice daily   furosemide (LASIX) 20 MG tablet Take 1 tablet (20 mg total) by mouth every other day.   meclizine (ANTIVERT) 25 MG tablet Take 1 tablet (25 mg total) by mouth 3 (three) times daily as needed for dizziness.   mexiletine  (MEXITIL) 200 MG capsule Take 1 capsule by mouth twice daily   nilotinib (TASIGNA) 150 MG capsule TAKE 2 CAPSULES (300 MG TOTAL) BY MOUTH EVERY 12 (TWELVE) HOURS. TAKE ON AN EMPTY STOMACH, 1 HR BEFORE OR 2 HRS AFTER FOOD.   nitroGLYCERIN (NITROSTAT) 0.4 MG SL tablet Place 1 tablet (0.4 mg total) under the tongue every 5 (five) minutes as needed for chest pain.   ondansetron (ZOFRAN) 4 MG tablet Take 1 tablet (4 mg total) by mouth every 8 (eight) hours as needed for nausea or vomiting.   sildenafil (VIAGRA) 100 MG tablet Take 0.5-1 tablets (50-100 mg total) by mouth daily as needed for erectile dysfunction.   No facility-administered encounter medications on file as of 07/09/2021.   Current Medications: Mexiletine 200 mg last filled 05/04/2021 30 DS Clopidogrel 75 mg last filled 04/16/2021 90 DS Atorvastatin 80 mg last filled 04/16/2021 90 DS Entresto 24-26 mg last filled 05/04/2021 30 DS Nilotinib 150 mg last filled 08/22/2019 28 DS Albuterol 108 mcg last filled 02/26/2020 25 DS Ondansetron 4 mg Lomotil 2.5-0.025 mg Carvedilol 12.5 mg last filled 04/13/2021 90 DS Meclizine 25 mg last filled 04/21/2020 Furosemide 20 mg Nitroglycerin 0.4 mg last filled 07/20/2020 20 DS Sildenafil 100 mg  Patient Questions: Any changes in your medications or health? Patient states he hasn't had any changes in his medications or health.  Any side effects from any medications?  Patient states he does  not have any side effects from any of his medications.  Do you have any symptoms or problems not managed by your medications? Patient denies having any symptoms or problems that are not currently managed by his medications.  Any concerns about your health right now? Patient denies having any concerns about his health at this time.  Has your provider asked that you check blood pressure, blood sugar, or follow special diet at home? He checks his blood pressure every morning. He does not check blood sugars  and he does not follow any special diet.  Do you get any type of exercise on a regular basis? Patient states he likes to walk around [employer].  Can you think of a goal you would like to reach for your health? He denies having a goal for his health.  Do you have any problems getting your medications? Patient states Delene Loll is too expensive.  Is there anything that you would like to discuss during the appointment?  Patient states he would like to discuss his medications.  Please bring medications and supplements to appointment  Care Gaps: Medicare Annual Wellness: Completed 05/07/2021 Hemoglobin A1C: 5.1% 05/31/2019 Colonoscopy: Aged out  Future Appointments  Date Time Provider [place]  07/15/2021 11:30 AM [facility] PHARMACIST [facility] None  08/25/2021  9:00 AM [facility] LAB [facility] None  08/30/2021 10:25 AM [facility] DEVICE REMOTES [facility]  09/03/2021  9:00 AM [name], MD [facility] None  11/29/2021 10:25 AM [facility] DEVICE REMOTES [facility]  02/28/2022 10:25 AM [facility] DEVICE REMOTES [facility]  05/30/2022 10:25 AM [facility] DEVICE REMOTES [facility]  08/29/2022 10:25 AM [facility] DEVICE REMOTES [facility]  11/28/2022 10:25 AM [facility] DEVICE REMOTES [facility]     Star Rating Drugs: Atorvastatin 80 mg last filled 04/16/2021 90 DS Entresto 24-26 mg last filled 05/04/2021 30 DS  April D Calhoun, [place] Pharmacist Assistant [phone]

## 2021-07-14 NOTE — Progress Notes (Signed)
Chronic Care Management Pharmacy Note  07/15/2021 Name:  Anthony Wolfe MRN:  [id number] DOB:  [birth year]  Summary: Initial visit with PharmD.  Patient with COPD on just Albuterol prn.  Previously tried Librarian, academic but has throat/mouth swelling.  He is having SOB when he exerts himself doing something simple as going to the fridge.  He is also having issues with copay on Entresto.  BP seems to be fluctuating and he reports high salt in diet.  Recommendations/Changes made from today's visit: Monitor BP x 2 weeeks - CMA to follow up Consider Stiolto for COPD maintenance PAP started for Entresto  Plan: FU 3 months CMA 2 weks   Subjective: Anthony Wolfe is an 75 y.o. year old male who is a primary patient of Pickard, Cammie Mcgee, MD.  The CCM team was consulted for assistance with disease management and care coordination needs.    Engaged with patient face to face for initial visit in response to provider referral for pharmacy case management and/or care coordination services.   Consent to Services:  The patient was given the following information about Chronic Care Management services today, agreed to services, and gave verbal consent: 1. CCM service includes personalized support from designated clinical staff supervised by the primary care provider, including individualized plan of care and coordination with other care providers 2. 24/7 contact phone numbers for assistance for urgent and routine care needs. 3. Service will only be billed when office clinical staff spend 20 minutes or more in a month to coordinate care. 4. Only one practitioner may furnish and bill the service in a calendar month. 5.The patient may stop CCM services at any time (effective at the end of the month) by phone call to the office staff. 6. The patient will be responsible for cost sharing (co-pay) of up to 20% of the service fee (after annual deductible is met). Patient agreed to services and consent obtained.  Patient  Care Team: [name], MD as PCP - General (Family Medicine) Deboraha Sprang, MD as PCP - Cardiology (Cardiology) [name]., MD as Attending Physician (Cardiology) [name], MD (Family Medicine) [name], MD (Cardiothoracic Surgery) Early, Arvilla Meres, MD as Attending Physician (Vascular Surgery) [name], MD as Consulting Physician (Hematology and Oncology) Deboraha Sprang, MD as Consulting Physician (Cardiology) [name], [facility] as Pharmacist (Pharmacist) [name], [facility] as Pharmacist (Pharmacist)  Recent office visits:  06/03/2021 Adele Dan, MD;  My biggest concern is keeping him on the [facility].  Therefore I gave him samples to last a month and I will set him up to meet with our clinical pharmacist to see if he can qualify for any prescription assistance programs.  If not we will transition him back to valsartan.  Start the patient on Celexa 20 mg a day   02/18/2021 OV (PCP) [name], MD;  I have asked the patient to check his blood pressure at home and if his systolic blood pressures consistently in the 140s I would like to increase the strength of the Entresto.   Recent consult visits:  02/01/2021 OV (Oncology) [name], MD; no medication changes indicated.   Hospital visits:  None in previous 6 months  Objective:  Lab Results  Component Value Date   CREATININE 0.88 05/25/2021   BUN 13 05/25/2021   GFR 60.62 06/23/2014   GFRNONAA >60 05/25/2021   GFRAA >60 04/24/2020   NA 143 05/25/2021   K 4.0  05/25/2021   CALCIUM 9.0 05/25/2021   CO2 24 05/25/2021   GLUCOSE 98 05/25/2021    Lab Results  Component Value Date/Time   HGBA1C 5.1 05/31/2019 08:37 AM   HGBA1C 5.5 08/12/2013 03:33 PM   GFR 60.62 06/23/2014 03:18 PM   GFR 55.47 (L) 06/16/2014 03:23 PM    Last diabetic Eye exam: No results found for: HMDIABEYEEXA  Last diabetic Foot exam: No results found for: HMDIABFOOTEX   Lab Results  Component  Value Date   CHOL 116 02/18/2021   HDL 36 (L) 02/18/2021   LDLCALC 63 02/18/2021   TRIG 90 02/18/2021   CHOLHDL 3.2 02/18/2021    Hepatic Function Latest Ref Rng & Units 05/25/2021 01/22/2021 10/23/2020  Total Protein 6.5 - 8.1 g/dL 7.0 6.6 6.7  Albumin 3.5 - 5.0 g/dL 3.5 3.5 3.1(L)  AST 15 - 41 U/L 12(L) 13(L) 13(L)  ALT 0 - 44 U/L _0 Alk Phosphatase 38 - 126 U/L 107 98 96  Total Bilirubin 0.3 - 1.2 mg/dL 0.5 0.3 0.6  Bilirubin, Direct 0.00 - 0.40 mg/dL - - -    Lab Results  Component Value Date/Time   TSH 2.510 03/15/2017 11:29 AM   TSH 2.03 06/02/2014 03:13 PM    CBC Latest Ref Rng & Units 05/25/2021 01/22/2021 10/23/2020  WBC 4.0 - 10.5 K/uL 7.1 8.0 6.9  Hemoglobin 13.0 - 17.0 g/dL 11.5(L) 11.6(L) 11.0(L)  Hematocrit 39.0 - 52.0 % 34.4(L) 36.0(L) 34.1(L)  Platelets 150 - 400 K/uL 249 201 225    No results found for: VD25OH  Clinical ASCVD: Yes  The ASCVD Risk score (Arnett DK, et al., 2019) failed to calculate for the following reasons:   The patient has a prior MI or stroke diagnosis   Financial Resource Strain: Low Risk    Difficulty of Paying Living Expenses: Not very hard    Depression screen [facility] 2/9 06/03/2021 02/18/2021 10/21/2020  Decreased Interest 1 0 0  Down, Depressed, Hopeless 2 0 0  PHQ - 2 Score 3 0 0  Altered sleeping 2 - -  Tired, decreased energy 2 - -  Change in appetite 0 - -  Feeling bad or failure about yourself  2 - -  Trouble concentrating 2 - -  Moving slowly or fidgety/restless 2 - -  Suicidal thoughts 0 - -  PHQ-9 Score 13 - -  Difficult doing work/chores Very difficult - -  Some recent data might be hidden     Social History   Tobacco Use  Smoking Status Former   Packs/day: 3.00   Years: 45.00   Pack years: 135.00   Types: Cigarettes   Quit date: 08/08/1997   Years since quitting: 23.9  Smokeless Tobacco Former   BP Readings from Last 3 Encounters:  06/04/21 (!) 152/66  06/03/21 128/66  02/18/21 (!) 142/88   Pulse  Readings from Last 3 Encounters:  06/04/21 67  06/03/21 66  02/18/21 66   Wt Readings from Last 3 Encounters:  06/04/21 182 lb (82.6 kg)  06/03/21 181 lb (82.1 kg)  02/18/21 182 lb (82.6 kg)   BMI Readings from Last 3 Encounters:  06/04/21 26.88 kg/m  06/03/21 26.73 kg/m  02/18/21 26.88 kg/m    Assessment/Interventions: Review of patient past medical history, allergies, medications, health status, including review of consultants reports, laboratory and other test data, was performed as part of comprehensive evaluation and provision of chronic care management services.   SDOH:  (Social Determinants of Health) assessments and  interventions performed: Yes  Financial Resource Strain: Low Risk    Difficulty of Paying Living Expenses: Not very hard    SDOH Screenings   Alcohol Screen: Low Risk    Last Alcohol Screening Score (AUDIT): 0  Depression (PHQ2-9): Medium Risk   PHQ-2 Score: 13  Financial Resource Strain: Low Risk    Difficulty of Paying Living Expenses: Not very hard  Food Insecurity: No Food Insecurity   Worried About Charity fundraiser in the Last Year: Never true   Ran Out of Food in the Last Year: Never true  Housing: Low Risk    Last Housing Risk Score: 0  Physical Activity: Sufficiently Active   Days of Exercise per Week: 5 days   Minutes of Exercise per Session: 30 min  Social Connections: Moderately Integrated   Frequency of Communication with Friends and Family: More than three times a week   Frequency of Social Gatherings with Friends and Family: Twice a week   Attends Religious Services: More than 4 times per year   Active Member of [employer] or Organizations: No   Attends Music therapist: Not on file   Marital Status: Married  Stress: No Stress Concern Present   Feeling of Stress : Only a little  Tobacco Use: Medium Risk   Smoking Tobacco Use: Former   Smokeless Tobacco Use: Former   Passive Exposure: Not on Pensions consultant  Needs: No Data processing manager (Medical): No   Lack of Transportation (Non-Medical): No    CCM Care Plan  Allergies  Allergen Reactions   Breztri Aerosphere [Budeson-Glycopyrrol-Formoterol] Swelling    Swelling of mouth tongue    Norvasc [Amlodipine Besylate] Rash    rash   Sulfa Antibiotics Hives    Hives     Medications Reviewed Today     Reviewed by [name], [facility] (Pharmacist) on 07/15/21 at 1245  Med List Status: <None>   Medication Order Taking? Sig Documenting Provider Last Dose Status Informant  albuterol (VENTOLIN HFA) 108 (90 Base) MCG/ACT inhaler [id number] Yes INHALE 2 PUFFS BY MOUTH EVERY 6 HOURS AS NEEDED FOR WHEEZING FOR SHORTNESS OF Montel Clock, MD Taking Active   atorvastatin (LIPITOR) 80 MG tablet [id number] Yes TAKE 1 TABLET BY MOUTH ONCE DAILY AT Adele Schilder, MD Taking Active   carvedilol (COREG) 12.5 MG tablet [id number] Yes Take 1 tablet by mouth twice daily Deboraha Sprang, MD Taking Active   clopidogrel (PLAVIX) 75 MG tablet [id number] Yes Take 1 tablet by mouth once daily [name], MD Taking Active   diphenoxylate-atropine (LOMOTIL) 2.5-0.025 MG tablet [id number] Yes Take 1 tablet by mouth 4 (four) times daily as needed for diarrhea or loose stools. [name], MD Taking Active   Sharon 24-26 [place] [id number] Yes Take 1 tablet by mouth twice daily [name], MD Taking Active   furosemide (LASIX) 20 MG tablet [id number] Yes Take 1 tablet (20 mg total) by mouth every other day. Deboraha Sprang, MD Taking Active   meclizine (ANTIVERT) 25 MG tablet [id number] No Take 1 tablet (25 mg total) by mouth 3 (three) times daily as needed for dizziness.  Patient not taking: Reported on 07/15/2021   [name], MD Not Taking Active   mexiletine (MEXITIL) 200 MG capsule [id number] Yes Take 1 capsule by mouth twice daily [name], MD Taking Active   nilotinib (TASIGNA) 150 MG capsule [id number]  Yes TAKE 2 CAPSULES (300 MG  TOTAL) BY MOUTH EVERY 12 (TWELVE) HOURS. TAKE ON AN EMPTY STOMACH, 1 HR BEFORE OR 2 HRS AFTER FOOD. [name], MD Taking Active   nitroGLYCERIN (NITROSTAT) 0.4 MG SL tablet [id number] Yes Place 1 tablet (0.4 mg total) under the tongue every 5 (five) minutes as needed for chest pain. Isaiah Serge, NP Taking Active   ondansetron (ZOFRAN) 4 MG tablet [id number] No Take 1 tablet (4 mg total) by mouth every 8 (eight) hours as needed for nausea or vomiting.  Patient not taking: Reported on 07/15/2021   [name], MD Not Taking Active   sildenafil (VIAGRA) 100 MG tablet [id number] Yes Take 0.5-1 tablets (50-100 mg total) by mouth daily as needed for erectile dysfunction. 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   Chronic anemia 09/21/2018   Anemia due to antineoplastic chemotherapy 11/17/2015   Protein-calorie malnutrition, mild ([place]) 11/17/2015   Congestive heart failure with cardiomyopathy ([place]) 06/25/2015   Chest skin lesion 04/06/2015   Pleural effusion on left 03/25/2015   Diarrhea due to drug 03/17/2015   Bradycardia 10/23/2014   Orthostatic hypotension 06/23/2014   Syncope 06/23/2014   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   CAD (coronary artery disease) of artery bypass graft, occluded VG-RCA 05/23/2014   NSTEMI (non-ST elevated myocardial infarction) ([place]) 05/21/2014   Cardiomyopathy, ischemic 05/21/2014   Ventricular tachycardia 05/16/2014   Muscle spasm 05/28/2013   PVCs (premature ventricular contractions) 05/23/2013   Carotid disease, bilateral ([place])    Hyperlipidemia    HTN (hypertension)    Allergic rhinitis    Prostate hypertrophy    Anxiety    CAD (coronary artery disease), hx of CABG    TIA (transient ischemic attack)     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    Immunization History  Administered Date(s) Administered   Fluad Quad(high Dose 65+) 05/14/2019   Influenza, High Dose Seasonal PF 06/22/2018   Influenza,inj,Quad PF,6+ Mos 05/21/2013, 05/07/2014, 04/28/2015, 04/28/2016, 05/09/2017   Pneumococcal Conjugate-13 01/29/2015   Pneumococcal Polysaccharide-23 05/07/2014    Conditions to be addressed/monitored:  CAD, HTN, HX of TIA, CHF, COPD, Allergic Rhinitis, HLD, Anxiety  Care Plan : General Pharmacy (Adult)  Updates made by [name], RPH since 07/15/2021 12:00 AM     Problem: CAD, HTN, HX of TIA, CHF, COPD, Allergic Rhinitis, HLD, Anxiety   Priority: High  Onset Date: 07/15/2021     Long-Range Goal: Patient-Specific Goal   Start Date: 07/15/2021  Expected End Date: 01/13/2022  This Visit's Progress: On track  Priority: High  Note:   Current Barriers:  Unable to independently afford treatment regimen Unable to achieve control of BP  Suboptimal therapeutic regimen for COPD  Pharmacist Clinical Goal(s):  Patient will verbalize ability to afford treatment regimen achieve control of BP as evidenced by home monitoring adhere to plan to optimize therapeutic regimen for COPD as evidenced by report of adherence to recommended medication management changes through collaboration with PharmD and provider.   Interventions: 1:1 collaboration with [name], MD regarding development and update of comprehensive plan of care as evidenced by provider attestation and co-signature Inter-disciplinary care team collaboration (see longitudinal plan of care) Comprehensive medication review performed; medication list updated in electronic medical record  Hypertension (BP goal <130/80) -Uncontrolled -Current  treatment: Carvedilol 12.65m BID Entresto 24-276mBID -Medications previously tried: lisinopril, losartan, Toprol XL, propranolol, valsartan, verapamil  -Current home readings:  148/71 today, no other home readings provided - does have access to cuff whenever he needs -Current dietary habits: HIGH salt intake, eats [employer] for breakfast every morning, other meals are home cooked by his wife.  She cooks a lot of vegetables from garden and will add lean meats in sometimes.  Not much hamburger.  Does add salt to foods at home. -Current exercise habits: loves to walk around [employer], hunts regularly, active in garden during the summer months -Denies hypotensive/hypertensive symptoms - did have some dizziness a few weeks ago which has resolved. -Educated on BP goals and benefits of medications for prevention of heart attack, stroke and kidney damage; Daily salt intake goal < 2300 mg; Importance of home blood pressure monitoring; Symptoms of hypotension and importance of maintaining adequate hydration; -Counseled to monitor BP at home daily, document, and provide log at future appointments -Counseled on diet and exercise extensively Recommended to continue current medication Recommended he monitor BP at home twice daily for 2 weeks.  Will have CMA call for BP check at that point and report to me. Adjust meds possibly at that time.  Hyperlipidemia: (LDL goal < 70) -Controlled -Current treatment: Atorvastatin 8074maily -Medications previously tried: none noted  -Current dietary patterns: see HTN -Current exercise habits: see HTN -Educated on Cholesterol goals;  Benefits of statin for ASCVD risk reduction; Importance of limiting foods high in cholesterol; -Recommended to continue current medication Most recent LDL is at goal, reports adherence with meds.  Heart Failure (Goal: manage symptoms and prevent exacerbations) -Controlled -Last ejection fraction: 40-45% -NYHA Class: II (slight limitation of activity) -AHA HF Stage: C (Heart disease and symptoms present) -Current treatment: Entresto 24-63m82mD Furosemide 20mg56mry other day - he is only taking  PRN Carvedilol 12.5mg B51m-Medications previously tried: losartan  -Current home BP/HR readings: 148/71, pulse unknown -Current dietary habits: see HTN - high salt content -Current exercise habits: see HTN -Educated on Importance of weighing daily; if you gain more than 3 pounds in one day or 5 pounds in one week, contact providers Importance of blood pressure control He does report symptoms of SOB, denies any visible swelling.  Take his lasix prn based on how much he is urinating. -Recommended to continue current medication Assessed patient finances. They are having issues with copay on Entresto, brought in taxes today and we will complete application and fax in.  Would prefer he stays on this med if at all possible. Recommended he weigh himself daily to determine fluid status and to see if Lasix is needed. Will FU once application for EntresDelene Lollbmitted.  COPD (Goal: control symptoms and prevent exacerbations) -Uncontrolled -Current treatment  Albuterol HFA 90mcg 64m-Medications previously tried: Breztri - swelling of mouth and tongue  -Current COPD Classification:  B (high sx, <2 exacerbations/yr) -Pulmonary function testing:Pulmonary Functions Testing Results:  No results found for: FEV1, FVC, FEV1FVC, TLC, DLCO  -Exacerbations requiring treatment in last 6 months: none -Patient denies consistent use of maintenance inhaler -Frequency of rescue inhaler use: 3-4 times per day -Counseled on Benefits of consistent maintenance inhaler use When to use rescue inhaler Differences between maintenance and rescue inhalers Possibility that mouth swelling was ICS induced thrush. -Recommended to continue current medication Based on symptom level I suspect patient would benefit from maintenance therapy for his COPD. As cost is an issue we will look at  options with easier PAP programs.   Recommend Stiolto Respimat due to ease of delivery and application process.  Will discuss with  PCP.   Patient Goals/Self-Care Activities Patient will:  - take medications as prescribed as evidenced by patient report and record review focus on medication adherence by pill box check blood pressure daily, document, and provide at future appointments weigh daily, and contact provider if weight gain of 3 lbs in one day or 5 lbs in one week  Follow Up Plan: The care management team will reach out to the patient again over the next 90 days.        Medication Assistance: Application for Stiolto/Entresto  medication assistance program. in process.  Anticipated assistance start date unknown.  See plan of care for additional detail.  Compliance/Adherence/Medication fill history: Care Gaps: Zoster Vaccine  Star-Rating Drugs: Atorvastatin 40m daily 04/16/21 90ds  [place]  [place] - [address] [place] [address]Phone: [phone]: [phone] Uses pill box? Yes Pt endorses 100% compliance  We discussed: Benefits of medication synchronization, packaging and delivery as well as enhanced pharmacist oversight with [employer]. Patient decided to: Continue current medication management strategy  Care Plan and Follow Up Patient Decision:  Patient agrees to Care Plan and Follow-up.  Plan: The care management team will reach out to the patient again over the next 90 days.  CBeverly Milch PharmD, CPP Clinical Pharmacist Practitioner [place]([phone]

## 2021-07-15 ENCOUNTER — Ambulatory Visit: Payer: Medicare HMO | Admitting: Pharmacist

## 2021-07-15 ENCOUNTER — Other Ambulatory Visit: Payer: Self-pay

## 2021-07-15 DIAGNOSIS — I10 Essential (primary) hypertension: Secondary | ICD-10-CM

## 2021-07-15 DIAGNOSIS — E785 Hyperlipidemia, unspecified: Secondary | ICD-10-CM

## 2021-07-15 DIAGNOSIS — I429 Cardiomyopathy, unspecified: Secondary | ICD-10-CM

## 2021-07-15 DIAGNOSIS — J449 Chronic obstructive pulmonary disease, unspecified: Secondary | ICD-10-CM

## 2021-07-15 DIAGNOSIS — I509 Heart failure, unspecified: Secondary | ICD-10-CM

## 2021-07-15 NOTE — Patient Instructions (Addendum)
Visit Information   Goals Addressed             This Visit's Progress    Track and Manage My Blood Pressure-Hypertension       Timeframe:  Long-Range Goal Priority:  High Start Date:   07/15/21                          Expected End Date:  01/13/22                     Follow Up Date 10/13/21    - check blood pressure daily - choose a place to take my blood pressure (home, clinic or office, retail store) - write blood pressure results in a log or diary    Why is this important?   You won't feel high blood pressure, but it can still hurt your blood vessels.  High blood pressure can cause heart or kidney problems. It can also cause a stroke.  Making lifestyle changes like losing a little weight or eating less salt will help.  Checking your blood pressure at home and at different times of the day can help to control blood pressure.  If the doctor prescribes medicine remember to take it the way the doctor ordered.  Call the office if you cannot afford the medicine or if there are questions about it.     Notes: CMA to call in 2 weeks     Track and Manage My Symptoms-COPD       Timeframe:  Long-Range Goal Priority:  High Start Date: 07/15/21                            Expected End Date: 01/13/22                       Follow Up Date 10/13/21    - begin a symptom diary - eliminate symptom triggers at home    Why is this important?   Tracking your symptoms and other information about your health helps your doctor plan your care.  Write down the symptoms, the time of day, what you were doing and what medicine you are taking.  You will soon learn how to manage your symptoms.     Notes:        Patient Care Plan: General Pharmacy (Adult)     Problem Identified: CAD, HTN, HX of TIA, CHF, COPD, Allergic Rhinitis, HLD, Anxiety   Priority: High  Onset Date: 07/15/2021     Long-Range Goal: Patient-Specific Goal   Start Date: 07/15/2021  Expected End Date: 01/13/2022  This  Visit's Progress: On track  Priority: High  Note:   Current Barriers:  Unable to independently afford treatment regimen Unable to achieve control of BP  Suboptimal therapeutic regimen for COPD  Pharmacist Clinical Goal(s):  Patient will verbalize ability to afford treatment regimen achieve control of BP as evidenced by home monitoring adhere to plan to optimize therapeutic regimen for COPD as evidenced by report of adherence to recommended medication management changes through collaboration with PharmD and provider.   Interventions: 1:1 collaboration with [name], MD regarding development and update of comprehensive plan of care as evidenced by provider attestation and co-signature Inter-disciplinary care team collaboration (see longitudinal plan of care) Comprehensive medication review performed; medication list updated in electronic medical record  Hypertension (BP goal <130/80) -Uncontrolled -Current treatment: Carvedilol  12.5mg  BID Entresto 24-26mg  BID -Medications previously tried: lisinopril, losartan, Toprol XL, propranolol, valsartan, verapamil  -Current home readings: 148/71 today, no other home readings provided - does have access to cuff whenever he needs -Current dietary habits: HIGH salt intake, eats [employer] for breakfast every morning, other meals are home cooked by his wife.  She cooks a lot of vegetables from garden and will add lean meats in sometimes.  Not much hamburger.  Does add salt to foods at home. -Current exercise habits: loves to walk around [employer], hunts regularly, active in garden during the summer months -Denies hypotensive/hypertensive symptoms - did have some dizziness a few weeks ago which has resolved. -Educated on BP goals and benefits of medications for prevention of heart attack, stroke and kidney damage; Daily salt intake goal < 2300 mg; Importance of home blood pressure monitoring; Symptoms of hypotension and importance of maintaining  adequate hydration; -Counseled to monitor BP at home daily, document, and provide log at future appointments -Counseled on diet and exercise extensively Recommended to continue current medication Recommended he monitor BP at home twice daily for 2 weeks.  Will have CMA call for BP check at that point and report to me. Adjust meds possibly at that time.  Hyperlipidemia: (LDL goal < 70) -Controlled -Current treatment: Atorvastatin 80mg  daily -Medications previously tried: none noted  -Current dietary patterns: see HTN -Current exercise habits: see HTN -Educated on Cholesterol goals;  Benefits of statin for ASCVD risk reduction; Importance of limiting foods high in cholesterol; -Recommended to continue current medication Most recent LDL is at goal, reports adherence with meds.  Heart Failure (Goal: manage symptoms and prevent exacerbations) -Controlled -Last ejection fraction: 40-45% -NYHA Class: II (slight limitation of activity) -AHA HF Stage: C (Heart disease and symptoms present) -Current treatment: Entresto 24-26mg  BID Furosemide 20mg  every other day - he is only taking PRN Carvedilol 12.5mg  BID -Medications previously tried: losartan  -Current home BP/HR readings: 148/71, pulse unknown -Current dietary habits: see HTN - high salt content -Current exercise habits: see HTN -Educated on Importance of weighing daily; if you gain more than 3 pounds in one day or 5 pounds in one week, contact providers Importance of blood pressure control He does report symptoms of SOB, denies any visible swelling.  Take his lasix prn based on how much he is urinating. -Recommended to continue current medication Assessed patient finances. They are having issues with copay on Entresto, brought in taxes today and we will complete application and fax in.  Would prefer he stays on this med if at all possible. Recommended he weigh himself daily to determine fluid status and to see if Lasix is  needed. Will FU once application for Delene Loll is submitted.  COPD (Goal: control symptoms and prevent exacerbations) -Uncontrolled -Current treatment  Albuterol HFA 72mcg prn -Medications previously tried: Breztri - swelling of mouth and tongue  -Current COPD Classification:  B (high sx, <2 exacerbations/yr) -Pulmonary function testing:Pulmonary Functions Testing Results:  No results found for: FEV1, FVC, FEV1FVC, TLC, DLCO  -Exacerbations requiring treatment in last 6 months: none -Patient denies consistent use of maintenance inhaler -Frequency of rescue inhaler use: 3-4 times per day -Counseled on Benefits of consistent maintenance inhaler use When to use rescue inhaler Differences between maintenance and rescue inhalers Possibility that mouth swelling was ICS induced thrush. -Recommended to continue current medication Based on symptom level I suspect patient would benefit from maintenance therapy for his COPD. As cost is an issue we will look at options with  easier PAP programs.   Recommend Stiolto Respimat due to ease of delivery and application process.  Will discuss with PCP.   Patient Goals/Self-Care Activities Patient will:  - take medications as prescribed as evidenced by patient report and record review focus on medication adherence by pill box check blood pressure daily, document, and provide at future appointments weigh daily, and contact provider if weight gain of 3 lbs in one day or 5 lbs in one week  Follow Up Plan: The care management team will reach out to the patient again over the next 90 days.       Mr. Sliger was given information about Chronic Care Management services today including:  CCM service includes personalized support from designated clinical staff supervised by his physician, including individualized plan of care and coordination with other care providers 24/7 contact phone numbers for assistance for urgent and routine care needs. Standard  insurance, coinsurance, copays and deductibles apply for chronic care management only during months in which we provide at least 20 minutes of these services. Most insurances cover these services at 100%, however patients may be responsible for any copay, coinsurance and/or deductible if applicable. This service may help you avoid the need for more expensive face-to-face services. Only one practitioner may furnish and bill the service in a calendar month. The patient may stop CCM services at any time (effective at the end of the month) by phone call to the office staff.  Patient agreed to services and verbal consent obtained.   The patient verbalized understanding of instructions, educational materials, and care plan provided today and agreed to receive a mailed copy of patient instructions, educational materials, and care plan.  Telephone follow up appointment with pharmacy team member scheduled for: 3 months  [name], [place], PharmD, [place] Clinical Pharmacist Practitioner [place] [phone]

## 2021-07-27 ENCOUNTER — Telehealth: Payer: Self-pay | Admitting: Pharmacist

## 2021-07-27 NOTE — Progress Notes (Signed)
° ° °  Chronic Care Management Pharmacy Assistant   Name: AYREN ZUMBRO  MRN: [id number] DOB: 1945/11/16   Reason for Encounter: PAP for [facility]   PAP form initiated for [place]. Will ne  mailed to patient to complete patient portion and return to prescribing MD office for final portion to be completed by MD and faxed in for patient for processing. Patient will update on the outcome of her application once received.   Jobe Gibbon, [facility] Clinical Pharmacist Assistant  [phone]

## 2021-08-06 ENCOUNTER — Encounter (HOSPITAL_COMMUNITY): Payer: Self-pay | Admitting: *Deleted

## 2021-08-06 ENCOUNTER — Other Ambulatory Visit: Payer: Self-pay

## 2021-08-06 ENCOUNTER — Encounter: Payer: Self-pay | Admitting: Family Medicine

## 2021-08-06 ENCOUNTER — Ambulatory Visit (INDEPENDENT_AMBULATORY_CARE_PROVIDER_SITE_OTHER): Payer: Medicare HMO | Admitting: Family Medicine

## 2021-08-06 ENCOUNTER — Emergency Department (HOSPITAL_COMMUNITY): Payer: Medicare HMO

## 2021-08-06 ENCOUNTER — Emergency Department (HOSPITAL_COMMUNITY)
Admission: EM | Admit: 2021-08-06 | Discharge: 2021-08-06 | Disposition: A | Discharge status: Home / Self Care | Payer: Medicare HMO | Attending: Emergency Medicine | Admitting: Emergency Medicine

## 2021-08-06 VITALS — BP 102/60 | HR 83 | Temp 98.7°F | Resp 16 | Ht 69.0 in | Wt 174.4 lb

## 2021-08-06 DIAGNOSIS — Z79899 Other long term (current) drug therapy: Secondary | ICD-10-CM | POA: Diagnosis not present

## 2021-08-06 DIAGNOSIS — Z856 Personal history of leukemia: Secondary | ICD-10-CM | POA: Insufficient documentation

## 2021-08-06 DIAGNOSIS — Z951 Presence of aortocoronary bypass graft: Secondary | ICD-10-CM | POA: Diagnosis not present

## 2021-08-06 DIAGNOSIS — I110 Hypertensive heart disease with heart failure: Secondary | ICD-10-CM | POA: Insufficient documentation

## 2021-08-06 DIAGNOSIS — I5022 Chronic systolic (congestive) heart failure: Secondary | ICD-10-CM | POA: Diagnosis not present

## 2021-08-06 DIAGNOSIS — R0602 Shortness of breath: Secondary | ICD-10-CM | POA: Diagnosis not present

## 2021-08-06 DIAGNOSIS — R509 Fever, unspecified: Secondary | ICD-10-CM | POA: Diagnosis present

## 2021-08-06 DIAGNOSIS — R55 Syncope and collapse: Secondary | ICD-10-CM | POA: Diagnosis not present

## 2021-08-06 DIAGNOSIS — Z7902 Long term (current) use of antithrombotics/antiplatelets: Secondary | ICD-10-CM | POA: Insufficient documentation

## 2021-08-06 DIAGNOSIS — J449 Chronic obstructive pulmonary disease, unspecified: Secondary | ICD-10-CM | POA: Diagnosis not present

## 2021-08-06 DIAGNOSIS — I2510 Atherosclerotic heart disease of native coronary artery without angina pectoris: Secondary | ICD-10-CM | POA: Diagnosis not present

## 2021-08-06 DIAGNOSIS — U071 COVID-19: Secondary | ICD-10-CM | POA: Insufficient documentation

## 2021-08-06 DIAGNOSIS — Z20822 Contact with and (suspected) exposure to covid-19: Secondary | ICD-10-CM | POA: Insufficient documentation

## 2021-08-06 DIAGNOSIS — Z87891 Personal history of nicotine dependence: Secondary | ICD-10-CM | POA: Insufficient documentation

## 2021-08-06 DIAGNOSIS — J1282 Pneumonia due to coronavirus disease 2019: Secondary | ICD-10-CM | POA: Diagnosis not present

## 2021-08-06 DIAGNOSIS — R059 Cough, unspecified: Secondary | ICD-10-CM | POA: Diagnosis not present

## 2021-08-06 DIAGNOSIS — J189 Pneumonia, unspecified organism: Secondary | ICD-10-CM

## 2021-08-06 DIAGNOSIS — I959 Hypotension, unspecified: Secondary | ICD-10-CM | POA: Diagnosis not present

## 2021-08-06 DIAGNOSIS — I517 Cardiomegaly: Secondary | ICD-10-CM | POA: Diagnosis not present

## 2021-08-06 LAB — CBC WITH DIFFERENTIAL/PLATELET
Abs Immature Granulocytes: 0.01 10*3/uL (ref 0.00–0.07)
Basophils Absolute: 0 10*3/uL (ref 0.0–0.1)
Basophils Relative: 0 %
Eosinophils Absolute: 0 10*3/uL (ref 0.0–0.5)
Eosinophils Relative: 0 %
HCT: 34 % — ABNORMAL LOW (ref 39.0–52.0)
Hemoglobin: 10.8 g/dL — ABNORMAL LOW (ref 13.0–17.0)
Immature Granulocytes: 0 %
Lymphocytes Relative: 10 %
Lymphs Abs: 0.5 10*3/uL — ABNORMAL LOW (ref 0.7–4.0)
MCH: 31 pg (ref 26.0–34.0)
MCHC: 31.8 g/dL (ref 30.0–36.0)
MCV: 97.7 fL (ref 80.0–100.0)
Monocytes Absolute: 0.4 10*3/uL (ref 0.1–1.0)
Monocytes Relative: 9 %
Neutro Abs: 3.9 10*3/uL (ref 1.7–7.7)
Neutrophils Relative %: 81 %
Platelets: 154 10*3/uL (ref 150–400)
RBC: 3.48 MIL/uL — ABNORMAL LOW (ref 4.22–5.81)
RDW: 15.4 % (ref 11.5–15.5)
WBC: 4.9 10*3/uL (ref 4.0–10.5)
nRBC: 0 % (ref 0.0–0.2)

## 2021-08-06 LAB — BASIC METABOLIC PANEL
Anion gap: 10 (ref 5–15)
BUN: 18 mg/dL (ref 8–23)
CO2: 24 mmol/L (ref 22–32)
Calcium: 7.8 mg/dL — ABNORMAL LOW (ref 8.9–10.3)
Chloride: 106 mmol/L (ref 98–111)
Creatinine, Ser: 0.8 mg/dL (ref 0.61–1.24)
GFR, Estimated: >60 mL/min (ref >60)
Glucose, Bld: 113 mg/dL — ABNORMAL HIGH (ref 70–99)
Potassium: 3.9 mmol/L (ref 3.5–5.1)
Sodium: 140 mmol/L (ref 135–145)

## 2021-08-06 LAB — RESP PANEL BY RT-PCR (FLU A&B, COVID) ARPGX2
Influenza A by PCR: NEGATIVE
Influenza B by PCR: NEGATIVE
SARS Coronavirus 2 by RT PCR: POSITIVE — AB

## 2021-08-06 MED ORDER — AMOXICILLIN-POT CLAVULANATE 875-125 MG PO TABS: 1.0000 | ORAL_TABLET | Freq: Two times a day (BID) | ORAL | 0 refills | Status: DC

## 2021-08-06 MED ORDER — SODIUM CHLORIDE 0.9 % IV BOLUS
1000.0000 mL | Freq: Once | INTRAVENOUS | Status: AC
  Administered 2021-08-06: 16:00:00 1000 mL via INTRAVENOUS

## 2021-08-06 MED ORDER — AZITHROMYCIN 250 MG PO TABS: 250.0000 mg | ORAL_TABLET | Freq: Every day | ORAL | 0 refills | Status: DC

## 2021-08-06 MED ORDER — AZITHROMYCIN 250 MG PO TABS: ORAL_TABLET | ORAL | 0 refills | Status: DC

## 2021-08-06 NOTE — Progress Notes (Signed)
Subjective:    Patient ID: Anthony Wolfe, male    DOB: [birth year], 75 y.o.   MRN: [id number]  He has a history of coronary artery disease status post three-vessel CABG in 2010.  He also had a non-ST elevation myocardial infarction in 2015.  He also has ischemic cardiomyopathy with an ejection fraction of 40-45%.  Also has a h/o TIA, CML, and COPD.  He is currently on a combination of carvedilol, Lasix, and Entresto for his congestive heart failure.  He also has a history of COPD.    Patient has had flulike symptoms for over a week.  Symptoms include head congestion, rhinorrhea, cough, chest congestion.  He reports shortness of breath and dyspnea on exertion.  He has had decreasing appetite and poor oral intake.  He was hypertensive upon arrival today.  On physical exam, he has right basilar crackles which are new concerning for right lower lobe pneumonia.  Patient has to go to the restroom and had a syncopal episode.  He had orthostatic drop in blood pressure and fell to the floor.  He is unable to stand.  Blood pressure was 90/50 at that point.  An IV was started and the patient was given a bolus of normal saline and was placed in Trendelenburg while EMS was called.  After receiving a bolus of saline, patient's blood pressure rebounded to 150/90.  His color improved and his mentation improved.  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 difficile colitis 06/16/2014   CAD (coronary artery disease)    a. s/p 3v CABG 2010. b. NSTEMI 05/2014 in setting of VT. Cath impressions: "Recent IMI with occluded SVG to RCA. Has Right to Right collaterals and collaterals from septal and OM. EF 40%."   Carotid disease, bilateral ([place])    a. Right  CEA 2002; known occluded left carotid. b. Duplex 06/2014: known occluded LICA, stable 7-56% RICA s/p CEA.   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 Prior EF 36%. b. 2014: 50%. c. 05/2014: EF 35-40% by echo, 40% with inf HK by cath.   NSTEMI (non-ST elevation myocardial infarction) ([place]) 05/2014   Pleural effusion 05/2014   Pneumonia 05/23/2014   Presence of permanent cardiac pacemaker    Prostate hypertrophy    PVC's (premature ventricular contractions)    a. PVC's with prior Holter showing PVC load of 22%.   Shortness of breath dyspnea    Stroke ([place]) 01   TIA (transient ischemic attack)    ASPVD, S./P. right CEA, 2002, and known occluded left carotid   Ventricular tachycardia    a. Admitted with VT 05/2013 - EPS with inducible sustained monomorphic VT; s/p Medtronic ICD 05/21/14.   Past Surgical History:  Procedure Laterality Date   BACK SURGERY     CAROTID ENDARTERECTOMY Right 01   carotidectomy  2003   right side   COLONOSCOPY  07/2010   CORONARY ARTERY BYPASS GRAFT  02/2009   3 vessel   ELECTROPHYSIOLOGIC STUDY  05/21/14   ELECTROPHYSIOLOGY STUDY N/A  05/21/2014   Procedure: ELECTROPHYSIOLOGY STUDY;  Surgeon: Deboraha Sprang, MD;  Location: [facility] CATH LAB;  Service: Cardiovascular;  Laterality: N/A;   EP IMPLANTABLE DEVICE  05/21/14   single chamber Metronic ICD   LEFT HEART CATHETERIZATION WITH CORONARY/GRAFT ANGIOGRAM N/A 05/19/2014   Procedure: LEFT HEART CATHETERIZATION WITH Beatrix Fetters;  Surgeon: Josue Hector, MD;  Location: [facility] CATH LAB;  Service: Cardiovascular;  Laterality: N/A;   PLEURAL BIOPSY N/A 11/27/2014   Procedure: PLEURAL BIOPSY;  Surgeon: [name], MD;  Location: [place];  Service: Thoracic;  Laterality: N/A;   PLEURAL EFFUSION DRAINAGE Right 11/27/2014    Procedure: DRAINAGE OF PLEURAL EFFUSION;  Surgeon: [name], MD;  Location: [place];  Service: Thoracic;  Laterality: Right;   TALC PLEURODESIS Right 11/27/2014   Procedure: Pietro Cassis;  Surgeon: [name], MD;  Location: [place];  Service: Thoracic;  Laterality: Right;   VIDEO ASSISTED THORACOSCOPY Right 11/27/2014   Procedure: RIGHT VIDEO ASSISTED THORACOSCOPY;  Surgeon: [name], MD;  Location: [place];  Service: Thoracic;  Laterality: Right;   Current Outpatient Medications on File Prior to Visit  Medication Sig Dispense Refill   albuterol (VENTOLIN HFA) 108 (90 Base) MCG/ACT inhaler INHALE 2 PUFFS BY MOUTH EVERY 6 HOURS AS NEEDED FOR WHEEZING FOR SHORTNESS OF BREATH 18 g 11   atorvastatin (LIPITOR) 80 MG tablet TAKE 1 TABLET BY MOUTH ONCE DAILY AT 6PM 90 tablet 0   carvedilol (COREG) 12.5 MG tablet Take 1 tablet by mouth twice daily 180 tablet 2   clopidogrel (PLAVIX) 75 MG tablet Take 1 tablet by mouth once daily 90 tablet 0   diphenoxylate-atropine (LOMOTIL) 2.5-0.025 MG tablet Take 1 tablet by mouth 4 (four) times daily as needed for diarrhea or loose stools. 30 tablet 0   ENTRESTO 24-26 MG Take 1 tablet by mouth twice daily 60 tablet 0   furosemide (LASIX) 20 MG tablet Take 1 tablet (20 mg total) by mouth every other day. 45 tablet 3   meclizine (ANTIVERT) 25 MG tablet Take 1 tablet (25 mg total) by mouth 3 (three) times daily as needed for dizziness. 30 tablet 0   mexiletine (MEXITIL) 200 MG capsule Take 1 capsule by mouth twice daily 60 capsule 0   nilotinib (TASIGNA) 150 MG capsule TAKE 2 CAPSULES (300 MG TOTAL) BY MOUTH EVERY 12 (TWELVE) HOURS. TAKE ON AN EMPTY STOMACH, 1 HR BEFORE OR 2 HRS AFTER FOOD. 112 capsule 12   nitroGLYCERIN (NITROSTAT) 0.4 MG SL tablet Place 1 tablet (0.4 mg total) under the tongue every 5 (five) minutes as needed for chest pain. 25 tablet 4   ondansetron (ZOFRAN) 4 MG tablet Take 1 tablet (4 mg total) by mouth every 8 (eight)  hours as needed for nausea or vomiting. 30 tablet 3   sildenafil (VIAGRA) 100 MG tablet Take 0.5-1 tablets (50-100 mg total) by mouth daily as needed for erectile dysfunction. 5 tablet 11   No current facility-administered medications on file prior to visit.   Allergies  Allergen Reactions   Breztri Aerosphere [Budeson-Glycopyrrol-Formoterol] Swelling    Swelling of mouth tongue    Norvasc [Amlodipine Besylate] Rash    rash   Sulfa Antibiotics Hives    Hives    Social History   Socioeconomic History   Marital status: Married    Spouse name: Not on file   Number of children: 2   Years of education: Not on file   Highest education level: Not on file  Occupational History    Comment: retired Development worker, international aid  Tobacco Use   Smoking status: Former    Packs/day: 3.00    Years: 45.00    Pack years: 135.00    Types: Cigarettes    Quit date: 08/08/1997    Years since quitting: 24.0   Smokeless tobacco: Former  Scientific laboratory technician Use: Never used  Substance and Sexual Activity   Alcohol use: No    Alcohol/week: 0.0 standard drinks   Drug use: No   Sexual activity: Yes  Other Topics Concern   Not on file  Social History Narrative   Not on file   Social Determinants of Health   Financial Resource Strain: Low Risk    Difficulty of Paying Living Expenses: Not very hard  Food Insecurity: No Food Insecurity   Worried About Charity fundraiser in the Last Year: Never true   Ran Out of Food in the Last Year: Never true  Transportation Needs: No Transportation Needs   Lack of Transportation (Medical): No   Lack of Transportation (Non-Medical): No  Physical Activity: Sufficiently Active   Days of Exercise per Week: 5 days   Minutes of Exercise per Session: 30 min  Stress: No Stress Concern Present   Feeling of Stress : Only a little  Social Connections: Moderately Integrated   Frequency of Communication with Friends and Family: More than three times a week   Frequency of Social  Gatherings with Friends and Family: Twice a week   Attends Religious Services: More than 4 times per year   Active Member of [employer] or Organizations: No   Attends Music therapist: Not on file   Marital Status: Married  Human resources officer Violence: Not At Risk   Fear of Current or Ex-Partner: No   Emotionally Abused: No   Physically Abused: No   Sexually Abused: No      Review of Systems  Gastrointestinal:  Positive for diarrhea.  All other systems reviewed and are negative.     Objective:   Physical Exam Vitals reviewed.  Constitutional:      Appearance: He is well-developed. He is ill-appearing and diaphoretic. He is not toxic-appearing.  Cardiovascular:     Rate and Rhythm: Normal rate and regular rhythm.     Heart sounds: Normal heart sounds. No murmur heard. Pulmonary:     Effort: Pulmonary effort is normal. Prolonged expiration present. No tachypnea, accessory muscle usage or respiratory distress.     Breath sounds: Rales present. No wheezing.    Abdominal:     General: Bowel sounds are normal. There is no distension.     Palpations: Abdomen is soft. There is no mass.     Tenderness: There is no abdominal tenderness. There is no guarding or rebound.  Neurological:     Mental Status: He is alert.          Assessment & Plan:  SOB (shortness of breath) - Plan: DG Chest 2 View  Syncope and collapse I believe the patient likely had a viral upper respiratory infection and possible COVID.  He also has physical exam findings concerning for right-sided pneumonia.  My plan was to treat the patient as an outpatient with antibiotics and obtain an x-ray.  However the patient had a syncopal episode and collapse of the floor while going to the restroom.  I believe this is an orthostatic drop in his blood pressure secondary to his dehydration and to his illness.  He received a bolus  of normal saline IV and EMS was called.  He will be taken to the emergency room for  further evaluation given his complicated medical history.

## 2021-08-06 NOTE — ED Provider Notes (Signed)
[facility] EMERGENCY DEPARTMENT Provider Note   CSN: [id number] Arrival date & time: 08/06/21  1227     History Chief Complaint  Patient presents with   Rectal Bleeding    Anthony Wolfe is a 75 y.o. male.  HPI  Patient with medical history including AICD CAD CHF TIA presents the emergency department with complaints of syncopal episode and URI-like symptoms.  Patient dates he has been having URI-like symptoms for the last couple days, he endorses fevers, chills, nasal congestion, productive cough, decrease in appetite, and general body aches.  He denies  chest pain, shortness of breath, denies stomach pains, nausea, vomit, diarrhea, states he still tolerating p.o., he is not immunocompromise, states he has not gotten his COVID or his influenza vaccine.  He went to his primary care office today and was told he has likely a pneumonia as he went to use restroom and as he was walking and passed out.  He states prior to passing out he denies having  chest pain or shortness of breath, states that his vision just went out causing him to fall.  He currently has no headaches, change in vision, paresthesias or  weakness of lower extremities, he denies actually hitting his head.  He denies  history of seizure disorders.  He states that the doctor noted that he had a low BP and thinks that he had orthostatic hypotension.  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 difficile colitis 06/16/2014   CAD (coronary artery disease)    a. s/p 3v CABG 2010. b. NSTEMI 05/2014 in setting of VT. Cath impressions: "Recent IMI with occluded SVG to RCA. Has Right to Right collaterals and collaterals from septal and OM. EF 40%."   Carotid disease, bilateral ([place])    a. Right CEA 2002; known occluded left carotid. b. Duplex 06/2014: known occluded LICA,  stable 5-27% RICA s/p CEA.   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 Prior EF 36%. b. 2014: 50%. c. 05/2014: EF 35-40% by echo, 40% with inf HK by cath.   NSTEMI (non-ST elevation myocardial infarction) ([place]) 05/2014   Pleural effusion 05/2014   Pneumonia 05/23/2014   Presence of permanent cardiac pacemaker    Prostate hypertrophy    PVC's (premature ventricular contractions)    a. PVC's with prior Holter showing PVC load of 22%.   Shortness of breath dyspnea    Stroke ([place]) 01   TIA (transient ischemic attack)    ASPVD, S./P. right CEA, 2002, and known occluded left carotid   Ventricular tachycardia    a. Admitted with VT 05/2013 - EPS with inducible sustained monomorphic VT; s/p [employer] ICD 05/21/14.    Patient Active Problem List   Diagnosis Date Noted   Preventive measure 08/03/2020   History of noncompliance with medical treatment 01/29/2020   ICD (implantable cardioverter-defibrillator) in place 11/10/2019   Chronic anemia 09/21/2018   Anemia due to antineoplastic chemotherapy 11/17/2015   Protein-calorie malnutrition, mild ([place]) 11/17/2015   Congestive heart failure with cardiomyopathy ([place]) 06/25/2015   Chest skin lesion 04/06/2015   Pleural effusion on left 03/25/2015  Diarrhea due to drug 03/17/2015   Bradycardia 10/23/2014   Orthostatic hypotension 06/23/2014   Syncope 06/23/2014   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   CAD (coronary artery disease) of artery bypass graft, occluded VG-RCA 05/23/2014   NSTEMI (non-ST elevated myocardial infarction) ([place]) 05/21/2014   Cardiomyopathy, ischemic 05/21/2014   Ventricular tachycardia 05/16/2014   Muscle spasm 05/28/2013   PVCs (premature ventricular contractions) 05/23/2013   Carotid disease, bilateral ([place])    Hyperlipidemia    HTN  (hypertension)    Allergic rhinitis    Prostate hypertrophy    Anxiety    CAD (coronary artery disease), hx of CABG    TIA (transient ischemic attack)    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    Past Surgical History:  Procedure Laterality Date   BACK SURGERY     CAROTID ENDARTERECTOMY Right 01   carotidectomy  2003   right side   COLONOSCOPY  07/2010   CORONARY ARTERY BYPASS GRAFT  02/2009   3 vessel   ELECTROPHYSIOLOGIC STUDY  05/21/14   ELECTROPHYSIOLOGY STUDY N/A 05/21/2014   Procedure: ELECTROPHYSIOLOGY STUDY;  Surgeon: Deboraha Sprang, MD;  Location: [facility] CATH LAB;  Service: Cardiovascular;  Laterality: N/A;   EP IMPLANTABLE DEVICE  05/21/14   single chamber Metronic ICD   LEFT HEART CATHETERIZATION WITH CORONARY/GRAFT ANGIOGRAM N/A 05/19/2014   Procedure: LEFT HEART CATHETERIZATION WITH Beatrix Fetters;  Surgeon: Josue Hector, MD;  Location: [facility] CATH LAB;  Service: Cardiovascular;  Laterality: N/A;   PLEURAL BIOPSY N/A 11/27/2014   Procedure: PLEURAL BIOPSY;  Surgeon: [name], MD;  Location: [place];  Service: Thoracic;  Laterality: N/A;   PLEURAL EFFUSION DRAINAGE Right 11/27/2014   Procedure: DRAINAGE OF PLEURAL EFFUSION;  Surgeon: [name], MD;  Location: [place];  Service: Thoracic;  Laterality: Right;   TALC PLEURODESIS Right 11/27/2014   Procedure: Pietro Cassis;  Surgeon: [name], MD;  Location: [place];  Service: Thoracic;  Laterality: Right;   VIDEO ASSISTED THORACOSCOPY Right 11/27/2014   Procedure: RIGHT VIDEO ASSISTED THORACOSCOPY;  Surgeon: [name], MD;  Location: [place];  Service: Thoracic;  Laterality: Right;       Family History  Problem Relation Age of Onset   Heart disease Mother    Alcohol abuse Father     Social History   Tobacco Use   Smoking status: Former    Packs/day: 3.00    Years: 45.00    Pack years: 135.00    Types: Cigarettes    Quit date: 08/08/1997    Years since quitting:  24.0   Smokeless tobacco: Former  Scientific laboratory technician Use: Never used  Substance Use Topics   Alcohol use: No    Alcohol/week: 0.0 standard drinks   Drug use: No    Home Medications Prior to Admission medications   Medication Sig Start Date End Date Taking? Authorizing Provider  albuterol (VENTOLIN HFA) 108 (90 Base) MCG/ACT inhaler INHALE 2 PUFFS BY MOUTH EVERY 6 HOURS AS NEEDED FOR WHEEZING FOR SHORTNESS OF BREATH 06/04/21   [name], MD  amoxicillin-clavulanate (AUGMENTIN) 875-125 MG tablet Take 1 tablet by mouth every 12 (twelve) hours. 08/06/21  Yes Marcello Fennel, PA-C  atorvastatin (LIPITOR) 80 MG tablet TAKE 1 TABLET BY MOUTH ONCE DAILY AT [facility] 07/05/21   [name], MD  azithromycin (ZITHROMAX) 250 MG tablet Take 1 tablet (250 mg total) by mouth daily. Take first 2 tablets together, then 1 every day until  finished. 08/06/21  Yes Marcello Fennel, PA-C  carvedilol (COREG) 12.5 MG tablet Take 1 tablet by mouth twice daily 04/13/21   Deboraha Sprang, MD  clopidogrel (PLAVIX) 75 MG tablet Take 1 tablet by mouth once daily 07/05/21   [name], MD  diphenoxylate-atropine (LOMOTIL) 2.5-0.025 MG tablet Take 1 tablet by mouth 4 (four) times daily as needed for diarrhea or loose stools. 06/04/21   [name], MD  ENTRESTO 24-26 MG Take 1 tablet by mouth twice daily 07/05/21   [name], MD  furosemide (LASIX) 20 MG tablet Take 1 tablet (20 mg total) by mouth every other day. 11/11/19   Deboraha Sprang, MD  meclizine (ANTIVERT) 25 MG tablet Take 1 tablet (25 mg total) by mouth 3 (three) times daily as needed for dizziness. 04/21/20   [name], MD  mexiletine (MEXITIL) 200 MG capsule Take 1 capsule by mouth twice daily 07/07/21   [name], MD  nilotinib (TASIGNA) 150 MG capsule TAKE 2 CAPSULES (300 MG TOTAL) BY MOUTH EVERY 12 (TWELVE) HOURS. TAKE ON AN EMPTY STOMACH, 1 HR BEFORE OR 2 HRS AFTER FOOD. 06/29/21   [name], MD  nitroGLYCERIN  (NITROSTAT) 0.4 MG SL tablet Place 1 tablet (0.4 mg total) under the tongue every 5 (five) minutes as needed for chest pain. 09/16/19   Isaiah Serge, NP  ondansetron (ZOFRAN) 4 MG tablet Take 1 tablet (4 mg total) by mouth every 8 (eight) hours as needed for nausea or vomiting. 06/04/21   [name], MD  sildenafil (VIAGRA) 100 MG tablet Take 0.5-1 tablets (50-100 mg total) by mouth daily as needed for erectile dysfunction. 09/06/16   [name], MD    Allergies    Judithann Sauger aerosphere [budeson-glycopyrrol-formoterol], Norvasc [amlodipine besylate], and Sulfa antibiotics  Review of Systems   Review of Systems  Constitutional:  Negative for chills and fever.  HENT:  Positive for congestion. Negative for sore throat.   Respiratory:  Positive for cough. Negative for chest tightness and shortness of breath.   Cardiovascular:  Negative for chest pain.  Gastrointestinal:  Negative for abdominal pain, diarrhea, nausea and vomiting.  Genitourinary:  Negative for enuresis.  Musculoskeletal:  Negative for back pain and myalgias.  Skin:  Negative for rash.  Neurological:  Negative for headaches.  Hematological:  Does not bruise/bleed easily.   Physical Exam Updated Vital Signs BP (!) 141/65    Pulse 75    Temp 98.2 F (36.8 C) (Oral)    Resp (!) 22    Ht 5\' 9"  (1.753 m)    Wt 79.4 kg    SpO2 93%    BMI 25.84 kg/m   Physical Exam Vitals and nursing note reviewed.  Constitutional:      General: He is not in acute distress.    Appearance: He is not ill-appearing.  HENT:     Head: Normocephalic and atraumatic.     Comments: No deformity to head present, no raccoon eyes battle sign noted.    Nose: Congestion present.     Mouth/Throat:     Mouth: Mucous membranes are moist.     Pharynx: Oropharynx is clear. No oropharyngeal exudate or posterior oropharyngeal erythema.  Eyes:     Conjunctiva/sclera: Conjunctivae normal.  Cardiovascular:     Rate and Rhythm: Normal rate and regular  rhythm.     Pulses: Normal pulses.     Heart sounds: No murmur heard.   No friction rub. No gallop.  Pulmonary:     Effort: No respiratory distress.     Breath sounds: Rales present. No wheezing or rhonchi.     Comments: Showed no signs respiratory distress, speaking full sentences, slightly tachypneic, nonhypoxic, patient slight rales heard in the right lower lobe, no wheezing or rhonchi present. Abdominal:     Palpations: Abdomen is soft.     Tenderness: There is no abdominal tenderness. There is no right CVA tenderness or left CVA tenderness.  Musculoskeletal:     Right lower leg: No edema.     Left lower leg: No edema.  Skin:    General: Skin is warm and dry.  Neurological:     Mental Status: He is alert.     Comments: No facial asymmetry, no difficulty with word finding, able follow two-step commands, no unilateral weakness present.  Gait fully intact.  Psychiatric:        Mood and Affect: Mood normal.    ED Results / Procedures / Treatments   Labs (all labs ordered are listed, but only abnormal results are displayed) Labs Reviewed  BASIC METABOLIC PANEL - Abnormal; Notable for the following components:      Result Value   Glucose, Bld 113 (*)    Calcium 7.8 (*)    All other components within normal limits  CBC WITH DIFFERENTIAL/PLATELET - Abnormal; Notable for the following components:   RBC 3.48 (*)    Hemoglobin 10.8 (*)    HCT 34.0 (*)    Lymphs Abs 0.5 (*)    All other components within normal limits  RESP PANEL BY RT-PCR (FLU A&B, COVID) [id number]    EKG EKG Interpretation  Date/Time:  Friday August 06 2021 15:36:10 EST Ventricular Rate:  78 PR Interval:  226 QRS Duration: 145 QT Interval:  405 QTC Calculation: 462 R Axis:   -83 Text Interpretation: Sinus rhythm Prolonged PR interval Nonspecific IVCD with LAD Anteroseptal infarct, old unchanged from a couple of years ago Confirmed by Noemi Chapel [phone]) on 08/06/2021 3:41:39 PM  Radiology DG Chest  Port 1 View  Result Date: 08/06/2021 CLINICAL DATA:  cough EXAM: PORTABLE CHEST 1 VIEW COMPARISON:  Chest x-ray 11/27/2020, CT chest 04/29/2017. FINDINGS: Cardiomegaly. The heart and mediastinal contours are within unchanged. Single lead left chest wall defibrillator in similar position. Cardiac surgical changes overlie the mediastinum. Aortic calcification. Interval development of a peripheral rounded airspace opacity within the right lower lobe. No pulmonary edema. Blunting of bilateral costophrenic angles with no definite significant pleural effusion. No pneumothorax. No acute osseous abnormality. IMPRESSION: Interval development of a peripheral rounded airspace opacity within the right lower lobe. Consider repeat PA and lateral view of the chest for further evaluation. Followup PA and lateral chest X-ray is recommended in 3-4 weeks following therapy to ensure resolution and exclude underlying malignancy. Electronically Signed   By: Iven Finn M.D.   On: 08/06/2021 15:10    Procedures Procedures   Medications Ordered in ED Medications  sodium chloride 0.9 % bolus 1,000 mL (1,000 mLs Intravenous New Bag/Given 08/06/21 1545)    ED Course  I have reviewed the triage vital signs and the nursing notes.  Pertinent labs & imaging results that were available during my care of the patient were reviewed by me and considered in my medical decision making (see chart for details).    MDM Rules/Calculators/A&P                         Initial impression-presents  with URI-like symptoms with syncopal episode.  Likely patient has possible pneumonia was orthostatic hypotension, will obtain basic lab work-up chest x-ray and reassess.  Also provide fluids.  Work-up-Cbc shows lites no significant hemoglobin 10.8 patient will be sent with patient, BMP shows glucose of 113 calcium 7.8, chest x-ray reveals interval development of a very peripheral round airspace opacity within the right lower lobe.   Orthostatics were obtained he was positive.   Reassessment-patient is reassessed, has no complaints this time, he is ambulate out difficulty, denies any lightheaded dizziness.  He is agreeable for discharge.  Rule out- low suspicion for CVA or intracranial head bleed as patient denies change in vision, paresthesias or weakness to upper lower extremities, no neuro deficits noted on exam, will defer CT imaging at this time as denies actually hitting his head he is on anticoagulant.  He is Ortho static positive this is likely the cause of his syncopal episode.  Low suspicion for ACS or arrhythmias as patient denies chest pain, shortness of breath, EKG sinus without signs of ischemia.  Low suspicion for systemic infection as patient is nontoxic-appearing, vital signs reassuring, no obvious source infection noted on exam.  Low suspicion for PE as there is no unilateral leg swelling, vital signs reassuring, not hypoxic, nontachycardic, presentation more consistent with URI.  Low suspicion patient would need  hospitalized due to viral infection or Covid as vital signs reassuring, patient is not in respiratory distress.     Plan-  URI-likely patient suffering from possible pneumonia, will start him on antibiotics, recommend symptom management, follow-up with PCP in 2 to 3 weeks for repeat chest x-ray. Syncopal episode-likely orthostatic will recommend hydration, follow-up with PCP as needed.  Given strict return precautions.  Vital signs have remained stable, no indication for hospital admission.  Patient discussed with attending and they agreed with assessment and plan.  Patient given at home care as well strict return precautions.  Patient verbalized that they understood agreed to said plan.     Final Clinical Impression(s) / ED Diagnoses Final diagnoses:  Community acquired pneumonia of right lower lobe of lung  Vasovagal syncope    Rx / DC Orders ED Discharge Orders          Ordered     azithromycin (ZITHROMAX) 250 MG tablet  Daily        08/06/21 1704    amoxicillin-clavulanate (AUGMENTIN) 875-125 MG tablet  Every 12 hours        08/06/21 1704             Aron Baba 08/06/21 1706    Noemi Chapel, MD 08/07/21 1115

## 2021-08-06 NOTE — ED Notes (Signed)
Pt ambulated in room without difficulty  

## 2021-08-06 NOTE — ED Triage Notes (Signed)
Pt brought in by [facility] from [facility] with c/o syncopal episode with loss of bowel and bladder. He was being seen today due to possible PNA. EMS reports BP 104/70 after 959ml NS bolus given by doctor's office. Initial SBP 88. EMS reports rhonchi in lower lung fields, O2 sat 95% on RA, HR 68.

## 2021-08-06 NOTE — Discharge Instructions (Addendum)
Imaging shows that you have possible right lower lobe pneumonia, start antibiotics Augmentin as well as azithromycin please take as prescribed.  I want you to follow-up with your primary doctor in 2 to 3 weeks for repeat chest x-ray to ensure that this is improving.  Syncopal episode-likely you are dehydrated please remember to stay hydrated and have 3 points of contact when going from a sitting to same position and wait a few seconds make sure that your body is ready to move.  Come back to the emergency department if you develop chest pain, shortness of breath, severe abdominal pain, uncontrolled nausea, vomiting, diarrhea.

## 2021-08-10 ENCOUNTER — Emergency Department (HOSPITAL_COMMUNITY)
Admission: EM | Admit: 2021-08-10 | Discharge: 2021-08-10 | Disposition: A | Discharge status: Home / Self Care | Payer: Medicare HMO | Attending: Emergency Medicine | Admitting: Emergency Medicine

## 2021-08-10 ENCOUNTER — Encounter (HOSPITAL_COMMUNITY): Payer: Self-pay | Admitting: Emergency Medicine

## 2021-08-10 ENCOUNTER — Other Ambulatory Visit: Payer: Self-pay

## 2021-08-10 DIAGNOSIS — I509 Heart failure, unspecified: Secondary | ICD-10-CM | POA: Diagnosis not present

## 2021-08-10 DIAGNOSIS — I2510 Atherosclerotic heart disease of native coronary artery without angina pectoris: Secondary | ICD-10-CM | POA: Insufficient documentation

## 2021-08-10 DIAGNOSIS — I110 Hypertensive heart disease with heart failure: Secondary | ICD-10-CM | POA: Insufficient documentation

## 2021-08-10 DIAGNOSIS — Z79899 Other long term (current) drug therapy: Secondary | ICD-10-CM | POA: Insufficient documentation

## 2021-08-10 DIAGNOSIS — E860 Dehydration: Secondary | ICD-10-CM | POA: Insufficient documentation

## 2021-08-10 DIAGNOSIS — U071 COVID-19: Secondary | ICD-10-CM | POA: Insufficient documentation

## 2021-08-10 DIAGNOSIS — R531 Weakness: Secondary | ICD-10-CM | POA: Diagnosis present

## 2021-08-10 LAB — COMPREHENSIVE METABOLIC PANEL
ALT: 13 U/L (ref 0–44)
AST: 14 U/L — ABNORMAL LOW (ref 15–41)
Albumin: 2.5 g/dL — ABNORMAL LOW (ref 3.5–5.0)
Alkaline Phosphatase: 60 U/L (ref 38–126)
Anion gap: 8 (ref 5–15)
BUN: 13 mg/dL (ref 8–23)
CO2: 26 mmol/L (ref 22–32)
Calcium: 7.9 mg/dL — ABNORMAL LOW (ref 8.9–10.3)
Chloride: 108 mmol/L (ref 98–111)
Creatinine, Ser: 0.9 mg/dL (ref 0.61–1.24)
GFR, Estimated: >60 mL/min (ref >60)
Glucose, Bld: 107 mg/dL — ABNORMAL HIGH (ref 70–99)
Potassium: 3.8 mmol/L (ref 3.5–5.1)
Sodium: 142 mmol/L (ref 135–145)
Total Bilirubin: 0.7 mg/dL (ref 0.3–1.2)
Total Protein: 5.8 g/dL — ABNORMAL LOW (ref 6.5–8.1)

## 2021-08-10 LAB — CBC WITH DIFFERENTIAL/PLATELET
Abs Immature Granulocytes: 0.04 10*3/uL (ref 0.00–0.07)
Basophils Absolute: 0 10*3/uL (ref 0.0–0.1)
Basophils Relative: 0 %
Eosinophils Absolute: 0 10*3/uL (ref 0.0–0.5)
Eosinophils Relative: 0 %
HCT: 30.3 % — ABNORMAL LOW (ref 39.0–52.0)
Hemoglobin: 9.5 g/dL — ABNORMAL LOW (ref 13.0–17.0)
Immature Granulocytes: 1 %
Lymphocytes Relative: 7 %
Lymphs Abs: 0.5 10*3/uL — ABNORMAL LOW (ref 0.7–4.0)
MCH: 30 pg (ref 26.0–34.0)
MCHC: 31.4 g/dL (ref 30.0–36.0)
MCV: 95.6 fL (ref 80.0–100.0)
Monocytes Absolute: 0.6 10*3/uL (ref 0.1–1.0)
Monocytes Relative: 8 %
Neutro Abs: 5.9 10*3/uL (ref 1.7–7.7)
Neutrophils Relative %: 84 %
Platelets: 166 10*3/uL (ref 150–400)
RBC: 3.17 MIL/uL — ABNORMAL LOW (ref 4.22–5.81)
RDW: 15.4 % (ref 11.5–15.5)
WBC: 7 10*3/uL (ref 4.0–10.5)
nRBC: 0 % (ref 0.0–0.2)

## 2021-08-10 MED ORDER — SODIUM CHLORIDE 0.9 % IV BOLUS
500.0000 mL | Freq: Once | INTRAVENOUS | Status: AC
  Administered 2021-08-10: 500 mL via INTRAVENOUS

## 2021-08-10 NOTE — ED Notes (Signed)
Sats 91-92% on RA during ambulation.  Tolerated well.

## 2021-08-10 NOTE — ED Provider Notes (Signed)
[facility] EMERGENCY DEPARTMENT Provider Note   CSN: [id number] Arrival date & time: 08/10/21  1000     History  Chief Complaint  Patient presents with   Covid Positive    Anthony Wolfe is a 76 y.o. male.  HPI      Anthony Wolfe is a 76 y.o. male with past medical history significant for hypertension, CML, emphysema, CHF, TIA, coronary artery disease, with AICD who presents to the Emergency Department complaining of generalized weakness, and decreased appetite.  Patient was seen here and evaluated on 08/06/2021.  He was found to have COVID.  He was previously evaluated by PCP and thought to have pneumonia.  He was started on Augmentin and azithromycin.  He has completed azithromycin course, but but reports decreased appetite after taking Augmentin.  Some mild nausea without vomiting.  No reported fever.  He denies any diarrhea, chest pain, dizziness or shortness of breath.    Home Medications Prior to Admission medications   Medication Sig Start Date End Date Taking? Authorizing Provider  albuterol (VENTOLIN HFA) 108 (90 Base) MCG/ACT inhaler INHALE 2 PUFFS BY MOUTH EVERY 6 HOURS AS NEEDED FOR WHEEZING FOR SHORTNESS OF BREATH 06/04/21   [name], MD  amoxicillin-clavulanate (AUGMENTIN) 875-125 MG tablet Take 1 tablet by mouth every 12 (twelve) hours. 08/06/21   Marcello Fennel, PA-C  atorvastatin (LIPITOR) 80 MG tablet TAKE 1 TABLET BY MOUTH ONCE DAILY AT [facility] 07/05/21   [name], MD  azithromycin (ZITHROMAX) 250 MG tablet Take 1 tablet (250 mg total) by mouth daily. Take first 2 tablets together, then 1 every day until finished. 08/06/21   Marcello Fennel, PA-C  carvedilol (COREG) 12.5 MG tablet Take 1 tablet by mouth twice daily 04/13/21   Deboraha Sprang, MD  clopidogrel (PLAVIX) 75 MG tablet Take 1 tablet by mouth once daily 07/05/21   [name], MD  diphenoxylate-atropine (LOMOTIL) 2.5-0.025 MG tablet Take 1 tablet by mouth 4 (four) times daily  as needed for diarrhea or loose stools. 06/04/21   [name], MD  ENTRESTO 24-26 MG Take 1 tablet by mouth twice daily 07/05/21   [name], MD  furosemide (LASIX) 20 MG tablet Take 1 tablet (20 mg total) by mouth every other day. 11/11/19   Deboraha Sprang, MD  meclizine (ANTIVERT) 25 MG tablet Take 1 tablet (25 mg total) by mouth 3 (three) times daily as needed for dizziness. 04/21/20   [name], MD  mexiletine (MEXITIL) 200 MG capsule Take 1 capsule by mouth twice daily 07/07/21   [name], MD  nilotinib (TASIGNA) 150 MG capsule TAKE 2 CAPSULES (300 MG TOTAL) BY MOUTH EVERY 12 (TWELVE) HOURS. TAKE ON AN EMPTY STOMACH, 1 HR BEFORE OR 2 HRS AFTER FOOD. 06/29/21   [name], MD  nitroGLYCERIN (NITROSTAT) 0.4 MG SL tablet Place 1 tablet (0.4 mg total) under the tongue every 5 (five) minutes as needed for chest pain. 09/16/19   Isaiah Serge, NP  ondansetron (ZOFRAN) 4 MG tablet Take 1 tablet (4 mg total) by mouth every 8 (eight) hours as needed for nausea or vomiting. 06/04/21   [name], MD  sildenafil (VIAGRA) 100 MG tablet Take 0.5-1 tablets (50-100 mg total) by mouth daily as needed for erectile dysfunction. 09/06/16   [name], MD      Allergies    Judithann Sauger aerosphere [budeson-glycopyrrol-formoterol], Norvasc [amlodipine besylate], and Sulfa antibiotics    Review of Systems  Review of Systems  Constitutional:  Positive for activity change and appetite change. Negative for chills, diaphoresis and fever.  HENT:  Negative for trouble swallowing.   Respiratory:  Positive for cough. Negative for shortness of breath and wheezing.   Cardiovascular:  Negative for chest pain.  Gastrointestinal:  Positive for nausea. Negative for abdominal pain, diarrhea and vomiting.  Genitourinary:  Negative for dysuria.  Musculoskeletal:  Negative for arthralgias and myalgias.  Neurological:  Positive for weakness. Negative for dizziness, syncope, facial asymmetry, speech  difficulty and headaches.  All other systems reviewed and are negative.  Physical Exam Updated Vital Signs BP (!) 165/64 (BP Location: Right Arm)    Pulse 65    Temp 98.3 F (36.8 C) (Oral)    Resp 16    Ht 5\' 9"  (1.753 m)    Wt 79.4 kg    SpO2 94%    BMI 25.84 kg/m  Physical Exam Vitals and nursing note reviewed.  Constitutional:      General: He is not in acute distress.    Appearance: Normal appearance. He is not toxic-appearing.  HENT:     Right Ear: Tympanic membrane and ear canal normal.     Left Ear: Tympanic membrane and ear canal normal.     Mouth/Throat:     Mouth: Mucous membranes are dry.     Pharynx: No oropharyngeal exudate or posterior oropharyngeal erythema.     Comments: Mucous membranes are extremely dry Eyes:     Conjunctiva/sclera: Conjunctivae normal.  Cardiovascular:     Rate and Rhythm: Normal rate and regular rhythm.     Pulses: Normal pulses.  Pulmonary:     Effort: Pulmonary effort is normal. No respiratory distress.     Breath sounds: No wheezing or rales.     Comments: Rhonchorous lung sounds that improved after coughing.  No increased work of breathing wheezing or rales Abdominal:     Palpations: Abdomen is soft.     Tenderness: There is no abdominal tenderness.  Musculoskeletal:     Cervical back: Normal range of motion.     Right lower leg: No edema.     Left lower leg: No edema.  Lymphadenopathy:     Cervical: No cervical adenopathy.  Skin:    Capillary Refill: Capillary refill takes less than 2 seconds.     Findings: No rash.  Neurological:     Mental Status: He is alert.     Sensory: No sensory deficit.     Motor: No weakness.    ED Results / Procedures / Treatments   Labs (all labs ordered are listed, but only abnormal results are displayed) Labs Reviewed  COMPREHENSIVE METABOLIC PANEL - Abnormal; Notable for the following components:      Result Value   Glucose, Bld 107 (*)    Calcium 7.9 (*)    Total Protein 5.8 (*)     Albumin 2.5 (*)    AST 14 (*)    All other components within normal limits  CBC WITH DIFFERENTIAL/PLATELET - Abnormal; Notable for the following components:   RBC 3.17 (*)    Hemoglobin 9.5 (*)    HCT 30.3 (*)    Lymphs Abs 0.5 (*)    All other components within normal limits    EKG None  Radiology No results found.  Procedures Procedures    Medications Ordered in ED Medications  sodium chloride 0.9 % bolus 500 mL (0 mLs Intravenous Stopped 08/10/21 1312)  sodium chloride 0.9 % bolus  500 mL (500 mLs Intravenous New Bag/Given 08/10/21 1323)    ED Course/ Medical Decision Making/ A&P                           Medical Decision Making  Patient returns here today for decreased appetite and generalized weakness.  States he has been unable to eat and drink since diagnosed with COVID on 08/06/2021.  At time of his prior visit,  He had a chest x-ray that showed a possible right-sided pneumonia.  He was started on Augmentin and azithromycin.  Patient reports decreased appetite since taking the Augmentin.  He has completed the course of azithromycin.  On my exam, patient appears dehydrated.  Mucous membranes are very dry.  Abdominal exam is reassuring.  There is no increased work of breathing.  Vital signs have been reviewed.  No tachycardia tachypnea or hypoxia.  Patient does not appear critically ill.  Work-up today will include repeat of CBC and bmet.  Will administer IV fluids and reassess.  Patient does have documented history of CHF not currently on any diuretics.  Will administer IV fluids slowly.  Labs interpreted by me, no leukocytosis.  Hemoglobin 9.5, similar to baseline.  No significant electrolyte abnormalities.  Chest x-ray from previous ER visit from 08/06/2021, x-ray imaging and interpretation reviewed by me.  Agreeable with radiology interpretation.  Shows interval development of a peripheral rounded airspace opacity within the right lower lobe. Consider repeat PA and  lateral view of the chest for further evaluation. Followup PA and lateral chest X-ray is recommended in 3-4 weeks following therapy to ensure resolution and exclude underlying malignancy.  On recheck of patient, he has received IV fluids and tolerated p.o. fluids without difficulty.  He reports feeling much better and states that he is ready for discharge home.  Patient ambulated in the department maintained O2 sat of 91 to 92% on room air.  I have offered hospital admission for dehydration and COVID related illness, patient declines.  States that he prefers states that he prefers to go home and will return if his symptoms worsen.  He continues to deny any shortness of breath or chest pain.  Has albuterol MDI at home.  We will have patient discontinue his Augmentin as this is felt to be the source of his decreased appetite.  He is without fever and leukocytosis, do not feel that additional antibiotics is warranted at this time.  I discussed chest x-ray results and he is agreeable to close outpatient follow-up with PCP to have chest x-ray repeated in 2 to 3 weeks.          Final Clinical Impression(s) / ED Diagnoses Final diagnoses:  Dehydration  COVID    Rx / DC Orders ED Discharge Orders     None         Kem Parkinson, PA-C 08/12/21 1556    Hayden Rasmussen, MD 08/12/21 1942

## 2021-08-10 NOTE — ED Triage Notes (Signed)
Pt to the ED after being diagnosed with covid on 12/30.  Pt returns today because he is unable to eat and drink.

## 2021-08-10 NOTE — Discharge Instructions (Signed)
It is important to continue to drink fluids.  Use your albuterol inhaler, 1 to 2 puffs every 4-6 hours.  You may take Tylenol every 4 hours if needed for fever or body aches.  Follow-up with your primary care provider later this week or beginning next week for recheck.  As discussed, you will need to have a repeat chest x-ray in 2 weeks.  Your primary care provider can order this for you.  Also, stop the Augmentin as this may be the cause of your decreased appetite.  Return to the emergency department for any new or worsening symptoms.

## 2021-08-10 NOTE — ED Notes (Signed)
Given water, tolerated well.  Wife at bedside.

## 2021-08-17 ENCOUNTER — Telehealth: Payer: Self-pay

## 2021-08-17 NOTE — Telephone Encounter (Signed)
Oral Oncology Patient Advocate Encounter  Met patient in [place] to complete a re-enrollment application for [employer] [facility] [place] (NPAF) in an effort to reduce the patient's out of pocket expense for Tasigna to $0.    Application completed and faxed to [phone].   [employer] phone number for follow up is [phone].   This encounter will be updated until final determination.   [place] [place] Phone [phone] Fax [phone] 08/17/2021 6:24 PM

## 2021-08-17 NOTE — Telephone Encounter (Signed)
Patient is approved for Tasigna at no cost from [employer] 08/14/21-08/07/22  [employer] uses Rx [place] [facility] [place] Phone [phone] Fax [phone] 08/17/2021 6:25 PM

## 2021-08-20 ENCOUNTER — Other Ambulatory Visit: Payer: Self-pay

## 2021-08-20 ENCOUNTER — Encounter: Payer: Self-pay | Admitting: Family Medicine

## 2021-08-20 ENCOUNTER — Ambulatory Visit (INDEPENDENT_AMBULATORY_CARE_PROVIDER_SITE_OTHER): Payer: Medicare HMO | Admitting: Family Medicine

## 2021-08-20 VITALS — BP 136/70 | HR 78 | Temp 98.0°F | Resp 16 | Ht 69.0 in | Wt 168.0 lb

## 2021-08-20 DIAGNOSIS — U071 COVID-19: Secondary | ICD-10-CM

## 2021-08-20 NOTE — Progress Notes (Signed)
Subjective:    Patient ID: Anthony Wolfe, male    DOB: Feb 22, 1946, 76 y.o.   MRN: [id number] I saw the patient before New Year's.  He was hypotensive and had pneumonia.  Patient had vasovagal syncope and referred him to the emergency room due to his complicated past medical history.  Lab work in the emergency room confirmed [place].  He has since stopped the antibiotics prescribed initially.  He was outside the 5-day window for paxlovid.  He is now eating better.  He is drinking well.  His blood pressure is a over 111 systolic.  He has resumed his Entresto.  He denies any chest pain pleurisy hemoptysis.  He denies any abdominal pain nausea vomiting. 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 difficile colitis 06/16/2014   CAD (coronary artery disease)    a. s/p 3v CABG 2010. b. NSTEMI 05/2014 in setting of VT. Cath impressions: "Recent IMI with occluded SVG to RCA. Has Right to Right collaterals and collaterals from septal and OM. EF 40%."   Carotid disease, bilateral ([place])    a. Right CEA 2002; known occluded left carotid. b. Duplex 06/2014: known occluded LICA, stable 5-52% RICA s/p CEA.   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 Prior EF 36%. b. 2014: 50%. c. 05/2014: EF 35-40% by echo, 40% with inf HK by cath.   NSTEMI (non-ST elevation myocardial infarction) ([place]) 05/2014   Pleural effusion 05/2014   Pneumonia 05/23/2014   Presence of permanent cardiac pacemaker    Prostate hypertrophy    PVC's (premature ventricular contractions)    a. PVC's with prior Holter showing PVC load of 22%.   Shortness of breath dyspnea    Stroke ([place]) 01   TIA  (transient ischemic attack)    ASPVD, S./P. right CEA, 2002, and known occluded left carotid   Ventricular tachycardia    a. Admitted with VT 05/2013 - EPS with inducible sustained monomorphic VT; s/p Medtronic ICD 05/21/14.   Past Surgical History:  Procedure Laterality Date   BACK SURGERY     CAROTID ENDARTERECTOMY Right 01   carotidectomy  2003   right side   COLONOSCOPY  07/2010   CORONARY ARTERY BYPASS GRAFT  02/2009   3 vessel   ELECTROPHYSIOLOGIC STUDY  05/21/14   ELECTROPHYSIOLOGY STUDY N/A 05/21/2014   Procedure: ELECTROPHYSIOLOGY STUDY;  Surgeon: Deboraha Sprang, MD;  Location: [facility] CATH LAB;  Service: Cardiovascular;  Laterality: N/A;   EP IMPLANTABLE DEVICE  05/21/14   single chamber Metronic ICD   LEFT HEART CATHETERIZATION WITH CORONARY/GRAFT ANGIOGRAM N/A 05/19/2014   Procedure: LEFT HEART CATHETERIZATION WITH Beatrix Fetters;  Surgeon: Josue Hector, MD;  Location: [facility] CATH LAB;  Service: Cardiovascular;  Laterality: N/A;   PLEURAL BIOPSY N/A 11/27/2014   Procedure: PLEURAL BIOPSY;  Surgeon: [name], MD;  Location: [place];  Service: Thoracic;  Laterality: N/A;   PLEURAL EFFUSION DRAINAGE Right 11/27/2014   Procedure: DRAINAGE OF PLEURAL EFFUSION;  Surgeon: [name], MD;  Location: [place];  Service: Thoracic;  Laterality: Right;   TALC PLEURODESIS Right 11/27/2014  Procedure: TALC PLEURADESIS;  Surgeon: [name], MD;  Location: [place];  Service: Thoracic;  Laterality: Right;   VIDEO ASSISTED THORACOSCOPY Right 11/27/2014   Procedure: RIGHT VIDEO ASSISTED THORACOSCOPY;  Surgeon: [name], MD;  Location: [place];  Service: Thoracic;  Laterality: Right;   Current Outpatient Medications on File Prior to Visit  Medication Sig Dispense Refill   albuterol (VENTOLIN HFA) 108 (90 Base) MCG/ACT inhaler INHALE 2 PUFFS BY MOUTH EVERY 6 HOURS AS NEEDED FOR WHEEZING FOR SHORTNESS OF BREATH 18 g 11   atorvastatin (LIPITOR) 80 MG tablet  TAKE 1 TABLET BY MOUTH ONCE DAILY AT 6PM 90 tablet 0   carvedilol (COREG) 12.5 MG tablet Take 1 tablet by mouth twice daily 180 tablet 2   citalopram (CELEXA) 20 MG tablet Take 20 mg by mouth daily.     clopidogrel (PLAVIX) 75 MG tablet Take 1 tablet by mouth once daily 90 tablet 0   diphenoxylate-atropine (LOMOTIL) 2.5-0.025 MG tablet Take 1 tablet by mouth 4 (four) times daily as needed for diarrhea or loose stools. 30 tablet 0   ENTRESTO 24-26 MG Take 1 tablet by mouth twice daily 60 tablet 0   furosemide (LASIX) 20 MG tablet Take 1 tablet (20 mg total) by mouth every other day. 45 tablet 3   meclizine (ANTIVERT) 25 MG tablet Take 1 tablet (25 mg total) by mouth 3 (three) times daily as needed for dizziness. 30 tablet 0   mexiletine (MEXITIL) 200 MG capsule Take 1 capsule by mouth twice daily 60 capsule 0   nilotinib (TASIGNA) 150 MG capsule TAKE 2 CAPSULES (300 MG TOTAL) BY MOUTH EVERY 12 (TWELVE) HOURS. TAKE ON AN EMPTY STOMACH, 1 HR BEFORE OR 2 HRS AFTER FOOD. 112 capsule 12   nitroGLYCERIN (NITROSTAT) 0.4 MG SL tablet Place 1 tablet (0.4 mg total) under the tongue every 5 (five) minutes as needed for chest pain. 25 tablet 4   ondansetron (ZOFRAN) 4 MG tablet Take 1 tablet (4 mg total) by mouth every 8 (eight) hours as needed for nausea or vomiting. 30 tablet 3   sildenafil (VIAGRA) 100 MG tablet Take 0.5-1 tablets (50-100 mg total) by mouth daily as needed for erectile dysfunction. 5 tablet 11   No current facility-administered medications on file prior to visit.   Allergies  Allergen Reactions   Breztri Aerosphere [Budeson-Glycopyrrol-Formoterol] Swelling    Swelling of mouth tongue    Norvasc [Amlodipine Besylate] Rash    rash   Sulfa Antibiotics Hives    Hives    Social History   Socioeconomic History   Marital status: Married    Spouse name: Not on file   Number of children: 2   Years of education: Not on file   Highest education level: Not on file  Occupational History     Comment: retired Development worker, international aid  Tobacco Use   Smoking status: Former    Packs/day: 3.00    Years: 45.00    Pack years: 135.00    Types: Cigarettes    Quit date: 08/08/1997    Years since quitting: 24.0   Smokeless tobacco: Former  Scientific laboratory technician Use: Never used  Substance and Sexual Activity   Alcohol use: No    Alcohol/week: 0.0 standard drinks   Drug use: No   Sexual activity: Yes  Other Topics Concern   Not on file  Social History Narrative   Not on file   Social Determinants of Health   Financial Resource Strain: Low  Risk    Difficulty of Paying Living Expenses: Not very hard  Food Insecurity: No Food Insecurity   Worried About Running Out of Food in the Last Year: Never true   Ran Out of Food in the Last Year: Never true  Transportation Needs: No Transportation Needs   Lack of Transportation (Medical): No   Lack of Transportation (Non-Medical): No  Physical Activity: Sufficiently Active   Days of Exercise per Week: 5 days   Minutes of Exercise per Session: 30 min  Stress: No Stress Concern Present   Feeling of Stress : Only a little  Social Connections: Moderately Integrated   Frequency of Communication with Friends and Family: More than three times a week   Frequency of Social Gatherings with Friends and Family: Twice a week   Attends Religious Services: More than 4 times per year   Active Member of [employer] or Organizations: No   Attends Music therapist: Not on file   Marital Status: Married  Human resources officer Violence: Not At Risk   Fear of Current or Ex-Partner: No   Emotionally Abused: No   Physically Abused: No   Sexually Abused: No      Review of Systems  Gastrointestinal:  Positive for diarrhea.  All other systems reviewed and are negative.     Objective:   Physical Exam Vitals reviewed.  Constitutional:      General: He is not in acute distress.    Appearance: He is well-developed and normal weight. He is not ill-appearing,  toxic-appearing or diaphoretic.  Cardiovascular:     Rate and Rhythm: Normal rate and regular rhythm.     Heart sounds: Normal heart sounds. No murmur heard. Pulmonary:     Effort: Pulmonary effort is normal. No tachypnea, accessory muscle usage, prolonged expiration or respiratory distress.     Breath sounds: No wheezing, rhonchi or rales.  Abdominal:     General: Bowel sounds are normal. There is no distension.     Palpations: Abdomen is soft. There is no mass.     Tenderness: There is no abdominal tenderness. There is no guarding or rebound.  Neurological:     Mental Status: He is alert.          Assessment & Plan:  COVID Patient is clinically back to his baseline.  He is doing well.  He has resumed his Entresto.  He is not hypoxic.  He denies any shortness of breath or chest pain.  He is now eating and drinking well.  Resume regular medication and normal activities.  Follow-up normally as planned.

## 2021-08-23 ENCOUNTER — Telehealth: Payer: Self-pay

## 2021-08-23 NOTE — Telephone Encounter (Signed)
Returned his call. He tested positive for covid on 12/30 while in the hospital with pneumonia at [facility]. He is scheduled for labs 1/18 only at [facility]. He is just verifying that he come to appt. Denies any covid symptoms and he feels fine. Told him to come to appt 1/18, he verbalized understanding.   Just FYI

## 2021-08-25 ENCOUNTER — Inpatient Hospital Stay: Payer: Medicare HMO | Attending: Hematology and Oncology

## 2021-08-25 ENCOUNTER — Other Ambulatory Visit: Payer: Self-pay

## 2021-08-25 DIAGNOSIS — C9210 Chronic myeloid leukemia, BCR/ABL-positive, not having achieved remission: Secondary | ICD-10-CM | POA: Diagnosis not present

## 2021-08-25 LAB — CBC WITH DIFFERENTIAL/PLATELET
Abs Immature Granulocytes: 0.04 10*3/uL (ref 0.00–0.07)
Basophils Absolute: 0.1 10*3/uL (ref 0.0–0.1)
Basophils Relative: 1 %
Eosinophils Absolute: 0.1 10*3/uL (ref 0.0–0.5)
Eosinophils Relative: 1 %
HCT: 33.5 % — ABNORMAL LOW (ref 39.0–52.0)
Hemoglobin: 10.6 g/dL — ABNORMAL LOW (ref 13.0–17.0)
Immature Granulocytes: 0 %
Lymphocytes Relative: 13 %
Lymphs Abs: 1.1 10*3/uL (ref 0.7–4.0)
MCH: 29.9 pg (ref 26.0–34.0)
MCHC: 31.6 g/dL (ref 30.0–36.0)
MCV: 94.4 fL (ref 80.0–100.0)
Monocytes Absolute: 0.7 10*3/uL (ref 0.1–1.0)
Monocytes Relative: 8 %
Neutro Abs: 6.9 10*3/uL (ref 1.7–7.7)
Neutrophils Relative %: 77 %
Platelets: 310 10*3/uL (ref 150–400)
RBC: 3.55 MIL/uL — ABNORMAL LOW (ref 4.22–5.81)
RDW: 15.7 % — ABNORMAL HIGH (ref 11.5–15.5)
WBC: 9 10*3/uL (ref 4.0–10.5)
nRBC: 0 % (ref 0.0–0.2)

## 2021-08-25 LAB — COMPREHENSIVE METABOLIC PANEL
ALT: 8 U/L (ref 0–44)
AST: 11 U/L — ABNORMAL LOW (ref 15–41)
Albumin: 3.2 g/dL — ABNORMAL LOW (ref 3.5–5.0)
Alkaline Phosphatase: 73 U/L (ref 38–126)
Anion gap: 5 (ref 5–15)
BUN: 9 mg/dL (ref 8–23)
CO2: 29 mmol/L (ref 22–32)
Calcium: 8.2 mg/dL — ABNORMAL LOW (ref 8.9–10.3)
Chloride: 110 mmol/L (ref 98–111)
Creatinine, Ser: 0.81 mg/dL (ref 0.61–1.24)
GFR, Estimated: >60 mL/min (ref >60)
Glucose, Bld: 105 mg/dL — ABNORMAL HIGH (ref 70–99)
Potassium: 3.6 mmol/L (ref 3.5–5.1)
Sodium: 144 mmol/L (ref 135–145)
Total Bilirubin: 0.5 mg/dL (ref 0.3–1.2)
Total Protein: 6.4 g/dL — ABNORMAL LOW (ref 6.5–8.1)

## 2021-08-26 ENCOUNTER — Other Ambulatory Visit: Payer: Self-pay

## 2021-08-26 MED ORDER — ENTRESTO 24-26 MG PO TABS: 1.0000 | ORAL_TABLET | Freq: Two times a day (BID) | ORAL | 3 refills | Status: DC

## 2021-08-27 ENCOUNTER — Other Ambulatory Visit: Payer: Self-pay | Admitting: Family Medicine

## 2021-08-30 ENCOUNTER — Ambulatory Visit (INDEPENDENT_AMBULATORY_CARE_PROVIDER_SITE_OTHER): Payer: Medicare HMO

## 2021-08-30 DIAGNOSIS — I255 Ischemic cardiomyopathy: Secondary | ICD-10-CM

## 2021-08-30 LAB — CUP PACEART REMOTE DEVICE CHECK
Battery Remaining Longevity: 43 mo
Battery Voltage: 2.97 V
Brady Statistic RV Percent Paced: 0.04 %
Date Time Interrogation Session: 20230123001702
HighPow Impedance: 43 Ohm
Implantable Lead Implant Date: 20151014
Implantable Lead Location: 753860
Implantable Pulse Generator Implant Date: 20151014
Lead Channel Impedance Value: 361 Ohm
Lead Channel Impedance Value: 399 Ohm
Lead Channel Pacing Threshold Amplitude: 0.75 V
Lead Channel Pacing Threshold Pulse Width: 0.4 ms
Lead Channel Sensing Intrinsic Amplitude: 3.5 mV
Lead Channel Sensing Intrinsic Amplitude: 3.5 mV
Lead Channel Setting Pacing Amplitude: 2 V
Lead Channel Setting Pacing Pulse Width: 0.4 ms
Lead Channel Setting Sensing Sensitivity: 0.3 mV

## 2021-08-31 LAB — BCR/ABL

## 2021-09-02 ENCOUNTER — Telehealth: Payer: Self-pay | Admitting: Family Medicine

## 2021-09-02 NOTE — Telephone Encounter (Signed)
Prescription re-sent and faxed confirmed.

## 2021-09-02 NOTE — Telephone Encounter (Signed)
I faxed this in last week.  Let me resend!!

## 2021-09-02 NOTE — Telephone Encounter (Signed)
Patient spoke with pharmacist who arranged for patient to receive 1 year of free entresto through [employer]; Rx from provider needed. [employer] can be contacted at [phone].  Please advise at [phone].

## 2021-09-03 ENCOUNTER — Telehealth: Payer: Self-pay

## 2021-09-03 ENCOUNTER — Inpatient Hospital Stay: Payer: Medicare HMO | Admitting: Hematology and Oncology

## 2021-09-03 NOTE — Telephone Encounter (Signed)
-----   Message from Deboraha Sprang, MD sent at 09/03/2021  9:53 AM EST ----- Remote reviewed. This remote is abnormal treated VT  can we followup with him about driving  Thanks SK

## 2021-09-03 NOTE — Telephone Encounter (Signed)
Successful telephone encounter to discuss VT with [facility] as Dr. Caryl Comes request. Patient unaware of episode. Shock plan and [place] driving restrictions reviewed. Patient verbalized understanding. Of note it appears patient has not been seen in clinic since 11/11/19. Patient made aware he is overdue for appointment and will be contacted by scheduling. Patient appreciative of call.

## 2021-09-07 NOTE — Telephone Encounter (Signed)
Pt has 09/27/2021 appointment with Tommye Standard, PA-C.

## 2021-09-10 NOTE — Progress Notes (Signed)
Remote ICD transmission.   

## 2021-09-14 ENCOUNTER — Encounter: Payer: Self-pay | Admitting: Hematology and Oncology

## 2021-09-14 ENCOUNTER — Inpatient Hospital Stay: Payer: Medicare HMO | Attending: Hematology and Oncology | Admitting: Hematology and Oncology

## 2021-09-14 ENCOUNTER — Other Ambulatory Visit: Payer: Self-pay

## 2021-09-14 DIAGNOSIS — D6481 Anemia due to antineoplastic chemotherapy: Secondary | ICD-10-CM | POA: Insufficient documentation

## 2021-09-14 DIAGNOSIS — Z79899 Other long term (current) drug therapy: Secondary | ICD-10-CM | POA: Insufficient documentation

## 2021-09-14 DIAGNOSIS — T451X5A Adverse effect of antineoplastic and immunosuppressive drugs, initial encounter: Secondary | ICD-10-CM

## 2021-09-14 DIAGNOSIS — C9210 Chronic myeloid leukemia, BCR/ABL-positive, not having achieved remission: Secondary | ICD-10-CM | POA: Insufficient documentation

## 2021-09-14 DIAGNOSIS — I10 Essential (primary) hypertension: Secondary | ICD-10-CM | POA: Diagnosis not present

## 2021-09-14 NOTE — Assessment & Plan Note (Signed)
His blood pressure is intermittently elevated He will continue medical management

## 2021-09-14 NOTE — Assessment & Plan Note (Signed)
He tolerated treatment well without major side effects He is in major molecular response He will continue treatment indefinitely I plan to see him again in 3 months for further follow-up

## 2021-09-14 NOTE — Progress Notes (Signed)
[place] OFFICE PROGRESS NOTE  Patient Care Team: [name], MD as PCP - General (Family Medicine) Deboraha Sprang, MD as PCP - Cardiology (Cardiology) [name]., MD as Attending Physician (Cardiology) [name], MD (Family Medicine) [name], MD (Cardiothoracic Surgery) Early, Arvilla Meres, MD as Attending Physician (Vascular Surgery) [name], MD as Consulting Physician (Hematology and Oncology) Deboraha Sprang, MD as Consulting Physician (Cardiology) [name], [facility] as Pharmacist (Pharmacist) [name], [facility] as Pharmacist (Pharmacist)  ASSESSMENT & PLAN:  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 The patient denies recent history of bleeding such as epistaxis, hematuria or hematochezia. He is asymptomatic from the anemia. We will observe for now.  HTN (hypertension) His blood pressure is intermittently elevated He will continue medical management  No orders of the defined types were placed in this encounter.   All questions were answered. The patient knows to call the clinic with any problems, questions or concerns. The total time spent in the appointment was 20 minutes encounter with patients including review of chart and various tests results, discussions about plan of care and coordination of care plan   [name], MD 09/14/2021 9:59 AM  INTERVAL HISTORY: Please see below for problem oriented charting. he returns for treatment follow-up on naloxone it for CML He had recent pulmonary infection a month ago but is recovered He denies recent diarrhea or new side effects of treatment He is compliant taking medications as directed  REVIEW OF SYSTEMS:    Constitutional: Denies fevers, chills or abnormal weight loss Eyes: Denies blurriness of vision Ears, nose, mouth, throat, and face: Denies mucositis or sore throat Respiratory: Denies cough, dyspnea or wheezes Cardiovascular: Denies palpitation, chest discomfort or lower extremity swelling Gastrointestinal:  Denies nausea, heartburn or change in bowel habits Skin: Denies abnormal skin rashes Lymphatics: Denies new lymphadenopathy or easy bruising Neurological:Denies numbness, tingling or new weaknesses Behavioral/Psych: Mood is stable, no new changes  All other systems were reviewed with the patient and are negative.  I have reviewed the past medical history, past surgical history, social history and family history with the patient and they are unchanged from previous note.  ALLERGIES:  is allergic to breztri aerosphere [budeson-glycopyrrol-formoterol], norvasc [amlodipine besylate], and sulfa antibiotics.  MEDICATIONS:  Current Outpatient Medications  Medication Sig Dispense Refill   albuterol (VENTOLIN HFA) 108 (90 Base) MCG/ACT inhaler INHALE 2 PUFFS BY MOUTH EVERY 6 HOURS AS NEEDED FOR WHEEZING FOR SHORTNESS OF BREATH 18 g 11   atorvastatin (LIPITOR) 80 MG tablet TAKE 1 TABLET BY MOUTH ONCE DAILY AT 6PM 90 tablet 0   carvedilol (COREG) 12.5 MG tablet Take 1 tablet by mouth twice daily 180 tablet 2   citalopram (CELEXA) 20 MG tablet Take 20 mg by mouth daily.     clopidogrel (PLAVIX) 75 MG tablet Take 1 tablet by mouth once daily 90 tablet 0   diphenoxylate-atropine (LOMOTIL) 2.5-0.025 MG tablet Take 1 tablet by mouth 4 (four) times daily as needed for diarrhea or loose stools. 30 tablet 0   furosemide (LASIX) 20 MG tablet Take 1 tablet (20 mg total) by mouth every other day. 45 tablet 3   meclizine (ANTIVERT) 25 MG tablet Take 1 tablet (25 mg total)  by mouth 3 (three) times daily as needed for dizziness. 30 tablet 0   mexiletine (MEXITIL) 200 MG capsule Take 1 capsule by mouth twice  daily 60 capsule 3   nilotinib (TASIGNA) 150 MG capsule TAKE 2 CAPSULES (300 MG TOTAL) BY MOUTH EVERY 12 (TWELVE) HOURS. TAKE ON AN EMPTY STOMACH, 1 HR BEFORE OR 2 HRS AFTER FOOD. 112 capsule 12   nitroGLYCERIN (NITROSTAT) 0.4 MG SL tablet Place 1 tablet (0.4 mg total) under the tongue every 5 (five) minutes as needed for chest pain. 25 tablet 4   ondansetron (ZOFRAN) 4 MG tablet Take 1 tablet (4 mg total) by mouth every 8 (eight) hours as needed for nausea or vomiting. 30 tablet 3   sacubitril-valsartan (ENTRESTO) 24-26 MG Take 1 tablet by mouth 2 (two) times daily. 180 tablet 3   sildenafil (VIAGRA) 100 MG tablet Take 0.5-1 tablets (50-100 mg total) by mouth daily as needed for erectile dysfunction. 5 tablet 11   No current facility-administered medications for this visit.    SUMMARY OF ONCOLOGIC HISTORY: Oncology History  Chronic myelogenous leukemia ([place])  05/04/2012 Bone Marrow Biopsy   BM confirmed diagnosis of CML in Chronic phase. BCR/ABL by PCR detected abnormalitis with b2a2 & b3a2 subtypes   05/09/2012 - 03/16/2015 Chemotherapy   He was started on treatment with Dasatinib 100 mg daily   06/06/2012 Adverse Reaction   Dose of medication was reduced to 50 mg daily due to pancytopenia   01/24/2013 Progression   Patient was noted to have elevated blood count which and was subsequently found to be noncompliant to treatment. The patient has not been on treatment for several months because his prescription ran out. He was restarted back on treatment   01/16/2014 Progression   Bcr/ABL by PCR is worse. Dose of Dasatinib was increased to 100 mg.   02/26/2014 Tumor Marker   Blood work for ABL kinase mutation was negative.   10/29/2014 Tumor Marker   BCR/ABL b2a2 & b3a2 0.29%, IS 0.1624%, not in MMR yet but improving   11/05/2014 Adverse Reaction   He had thoracentesis due to pleural effusion   11/27/2014 Surgery   He underwent right video-assisted thoracoscopy, Drainage of pleural  effusion, Pleural biopsy, Diaphragm biopsy, Lung biopsy & [place] pleurodesis   01/30/2015 Tumor Marker   BCR/ABL b2a2 & b3a2 0.78%, IS 0.4368%, not in MMR    03/11/2015 Pathology Results   BCR/ABL b2a2 0.19%, IS 0.1064%, not in MMR    03/16/2015 Adverse Reaction   Dasatinib was stopped due to recurrent pleural effusion   03/28/2015 - 11/06/2017 Chemotherapy   He started on Bosutinib   03/30/2015 Procedure   He has therapeutic thoracentesis of the right lung with 1 liter of fluid removed   03/30/2015 Adverse Reaction   Bosutinib is placed on hold, to be restart on 8/25 at 250 mg due to severe diarrhea   04/28/2015 Tumor Marker   BCR/ABL b2a2 0.06%, IS 0.0336%, in MMR    06/10/2015 Tumor Marker   BCR/ABL b2a2 0.14%, IS 0.1162%, not in MMR    08/14/2015 Pathology Results   BCR/ABL b2a2 0.02%, IS 0.0166%, In MMR    11/09/2015 Tumor Marker   BCR/ABL b2a2 0.005%, IS 0.0042%, In MMR    02/08/2016 Tumor Marker   BCR/ABL undetectable. In MMR   06/13/2016 Tumor Marker   BCR/ABL undetectable. In MMR   12/06/2016 Pathology Results   BCR/ABL undetectable. In MMR   06/08/2017 Pathology Results   BCR/ABL undetectable. In MMR  09/08/2017 Pathology Results   BCR/ABL undetectable. In MMR   12/11/2017 Pathology Results   BCR/ABL undetectable. In MMR   03/19/2018 Pathology Results   BCR/ABL undetectable. In MMR   06/12/2018 Pathology Results   BCR/ABL undetectable. In MMR   09/06/2018 Pathology Results   BCR ABL is detectable. e13a2 (b2a2) and [id number]) IS: 0.009%   09/26/2018 -  Chemotherapy   The patient had Tasigna    11/21/2018 Pathology Results   BCR/ABL undetectable. In MMR   03/07/2019 Pathology Results   BCR/ABL undetectable. In MMR   06/17/2019 Pathology Results   BCR/ABL undetectable. In MMR   10/14/2019 Pathology Results   BCR ABL is detectable. e13a2 (b2a2) and L79G9(Q1J9) IS: 0.0281%. In MMR   01/14/2020 Pathology Results   BCR ABL is detectable. e13a2 (b2a2) and E17E0(C1K4) IS:  0.023%. In MMR   10/23/2020 Pathology Results   BCR ABL is detectable. e13a2 (b2a2) and Y18H6(D1S9) IS: 0.0643%. In MMR   01/22/2021 Pathology Results   BCR ABL is detectable. e13a2 (b2a2) and F02O3(Z8H8) IS: 0.027%. In MMR   05/25/2021 Pathology Results   BCR ABL is detectable. e13a2 (b2a2) and I50Y7(X4J2) IS: 0.0091%. In MMR   08/25/2021 Pathology Results   BCR ABL is detectable. e13a2 (b2a2) and I78M7(E7M0) IS: 0.0039%. In MMR     PHYSICAL EXAMINATION: ECOG PERFORMANCE STATUS: 1 - Symptomatic but completely ambulatory  Vitals:   09/14/21 0947  BP: (!) 152/76  Pulse: 76  Resp: 18  Temp: 97.6 F (36.4 C)  SpO2: 99%   Filed Weights   09/14/21 0947  Weight: 176 lb (79.8 kg)    GENERAL:alert, no distress and comfortable SKIN: skin color, texture, turgor are normal, no rashes or significant lesions EYES: normal, Conjunctiva are pink and non-injected, sclera clear OROPHARYNX:no exudate, no erythema and lips, buccal mucosa, and tongue normal  NECK: supple, thyroid normal size, non-tender, without nodularity LYMPH:  no palpable lymphadenopathy in the cervical, axillary or inguinal LUNGS: clear to auscultation and percussion with normal breathing effort HEART: regular rate & rhythm and no murmurs and no lower extremity edema ABDOMEN:abdomen soft, non-tender and normal bowel sounds Musculoskeletal:no cyanosis of digits and no clubbing  NEURO: alert & oriented x 3 with fluent speech, no focal motor/sensory deficits  LABORATORY DATA:  I have reviewed the data as listed    Component Value Date/Time   NA 144 08/25/2021 0838   NA 142 06/08/2017 0832   K 3.6 08/25/2021 0838   K 3.9 06/08/2017 0832   CL 110 08/25/2021 0838   CL 110 (H) 11/14/2012 0918   CO2 29 08/25/2021 0838   CO2 25 06/08/2017 0832   GLUCOSE 105 (H) 08/25/2021 0838   GLUCOSE 98 06/08/2017 0832   GLUCOSE 108 (H) 11/14/2012 0918   BUN 9 08/25/2021 0838   BUN 11.6 06/08/2017 0832   CREATININE 0.81  08/25/2021 0838   CREATININE 1.13 01/22/2021 0840   CREATININE 0.90 05/31/2019 0837   CREATININE 0.9 06/08/2017 0832   CALCIUM 8.2 (L) 08/25/2021 0838   CALCIUM 8.6 06/08/2017 0832   PROT 6.4 (L) 08/25/2021 0838   PROT 6.7 06/08/2017 0832   ALBUMIN 3.2 (L) 08/25/2021 0838   ALBUMIN 3.4 (L) 06/08/2017 0832   AST 11 (L) 08/25/2021 0838   AST 13 (L) 01/22/2021 0840   AST 13 06/08/2017 0832   ALT 8 08/25/2021 0838   ALT 14 01/22/2021 0840   ALT 7 06/08/2017 0832   ALKPHOS 73 08/25/2021 0838   ALKPHOS 96 06/08/2017 9470  BILITOT 0.5 08/25/2021 0838   BILITOT 0.3 01/22/2021 0840   BILITOT 0.44 06/08/2017 0832   GFRNONAA >60 08/25/2021 0838   GFRNONAA >60 01/22/2021 0840   GFRNONAA 83 05/31/2019 0837   GFRAA >60 04/24/2020 0842   GFRAA 96 05/31/2019 0837    No results found for: SPEP, UPEP  Lab Results  Component Value Date   WBC 9.0 08/25/2021   NEUTROABS 6.9 08/25/2021   HGB 10.6 (L) 08/25/2021   HCT 33.5 (L) 08/25/2021   MCV 94.4 08/25/2021   PLT 310 08/25/2021      Chemistry      Component Value Date/Time   NA 144 08/25/2021 0838   NA 142 06/08/2017 0832   K 3.6 08/25/2021 0838   K 3.9 06/08/2017 0832   CL 110 08/25/2021 0838   CL 110 (H) 11/14/2012 0918   CO2 29 08/25/2021 0838   CO2 25 06/08/2017 0832   BUN 9 08/25/2021 0838   BUN 11.6 06/08/2017 0832   CREATININE 0.81 08/25/2021 0838   CREATININE 1.13 01/22/2021 0840   CREATININE 0.90 05/31/2019 0837   CREATININE 0.9 06/08/2017 0832      Component Value Date/Time   CALCIUM 8.2 (L) 08/25/2021 0838   CALCIUM 8.6 06/08/2017 0832   ALKPHOS 73 08/25/2021 0838   ALKPHOS 96 06/08/2017 0832   AST 11 (L) 08/25/2021 0838   AST 13 (L) 01/22/2021 0840   AST 13 06/08/2017 0832   ALT 8 08/25/2021 0838   ALT 14 01/22/2021 0840   ALT 7 06/08/2017 0832   BILITOT 0.5 08/25/2021 0838   BILITOT 0.3 01/22/2021 0840   BILITOT 0.44 06/08/2017 6191

## 2021-09-14 NOTE — Assessment & Plan Note (Signed)
This is likely anemia of chronic disease. The patient denies recent history of bleeding such as epistaxis, hematuria or hematochezia. He is asymptomatic from the anemia. We will observe for now.  

## 2021-09-22 NOTE — Progress Notes (Signed)
Cardiology Office Note Date:  09/22/2021  Patient ID:  Anthony Wolfe, Anthony Wolfe [birth year], MRN [id number] PCP:  [name], MD  Electrophysiologist:  Dr. Caryl Comes Pulmonary: Dr. Halford Chessman     Chief Complaint:  VT on remote, over due visit  History of Present Illness: DEDDRICK Wolfe is a 76 y.o. male with history of VT (+EP inducible VT) w/ICD, CAD (CABG 2010), PVD (R CEA, known occluded L ICA), chronic CHF (systolic), ICM, CML (follows with oncology), emphysema,  HTN, HLD, CVA.  He comes today to be seen for Dr. Caryl Comes, 11/2019, noted some NSVT episodes, one labled VT episode thought to be an SVT, and PVCs as well. Reports of some DOE/Chest discomfort and planned to update his stress and echo, also started on lasix for volume Dr. Caryl Comes discussed him as off mexiletine, though remained on his med list  He had an APP follow up in Oct, 2021, did a good amount of gardening, some DOE, had not yet gotten stress or echo, had no ongoing CP, remained more SOB then in the past though remained active  TTE noted LVEF 40-45%, global hypokinesis, RV was OK, LA severely dilated, measured 37mm  TODAY He is accompanied by his wife.   He feels very well. Had COVID and a pneumonia Dec and feeling well again. No CP, palpitations or SOB NO DOE or exertional incapacities. No near syncope or syncope.  He says that in Nov when he had the VT he was very likely dragging a deer in from hunting.  Had no awareness of the arrhythmia  Lasix is written for daily though only uses PRN for swelling and has not used it it in quite a long time, otherwise taking his meds as written Left the house today without though   Device information: MDT single chamber ICD, implanted 05/21/14, Dr. Caryl Comes + Hx of VT AAD:  mexiletine started Sept 2018 ? Intolerant of amio  Past Medical History:  Diagnosis Date   AICD (automatic cardioverter/defibrillator) present    Allergic rhinitis    Anemia in neoplastic disease 09/25/2013    Anxiety    Blood dyscrasia    cmleukemia   C. difficile colitis 06/16/2014   CAD (coronary artery disease)    a. s/p 3v CABG 2010. b. NSTEMI 05/2014 in setting of VT. Cath impressions: "Recent IMI with occluded SVG to RCA. Has Right to Right collaterals and collaterals from septal and OM. EF 40%."   Carotid disease, bilateral ([place])    a. Right CEA 2002; known occluded left carotid. b. Duplex 06/2014: known occluded LICA, stable 6-75% RICA s/p CEA.   Chronic systolic CHF (congestive heart failure) (HCC)    CML (chronic myelocytic leukemia) ([place]) 05/09/2012   Depression    Emphysema of lung (HCC)    HTN (hypertension)    Hyperlipidemia    Ischemic cardiomyopathy    a. Prior EF 36%. b. 2014: 50%. c. 05/2014: EF 35-40% by echo, 40% with inf HK by cath.   NSTEMI (non-ST elevation myocardial infarction) ([place]) 05/2014   Pleural effusion 05/2014   Pneumonia 05/23/2014   Presence of permanent cardiac pacemaker    Prostate hypertrophy    PVC's (premature ventricular contractions)    a. PVC's with prior Holter showing PVC load of 22%.   Shortness of breath dyspnea    Stroke ([place]) 01   TIA (transient ischemic attack)    ASPVD, S./P. right CEA, 2002, and known occluded left carotid   Ventricular tachycardia    a.  Admitted with VT 05/2013 - EPS with inducible sustained monomorphic VT; s/p Medtronic ICD 05/21/14.    Past Surgical History:  Procedure Laterality Date   BACK SURGERY     CAROTID ENDARTERECTOMY Right 01   carotidectomy  2003   right side   COLONOSCOPY  07/2010   CORONARY ARTERY BYPASS GRAFT  02/2009   3 vessel   ELECTROPHYSIOLOGIC STUDY  05/21/14   ELECTROPHYSIOLOGY STUDY N/A 05/21/2014   Procedure: ELECTROPHYSIOLOGY STUDY;  Surgeon: Deboraha Sprang, MD;  Location: [facility] CATH LAB;  Service: Cardiovascular;  Laterality: N/A;   EP IMPLANTABLE DEVICE  05/21/14   single chamber Metronic ICD   LEFT HEART CATHETERIZATION WITH CORONARY/GRAFT ANGIOGRAM N/A 05/19/2014   Procedure: LEFT  HEART CATHETERIZATION WITH Beatrix Fetters;  Surgeon: Josue Hector, MD;  Location: [facility] CATH LAB;  Service: Cardiovascular;  Laterality: N/A;   PLEURAL BIOPSY N/A 11/27/2014   Procedure: PLEURAL BIOPSY;  Surgeon: [name], MD;  Location: [place];  Service: Thoracic;  Laterality: N/A;   PLEURAL EFFUSION DRAINAGE Right 11/27/2014   Procedure: DRAINAGE OF PLEURAL EFFUSION;  Surgeon: [name], MD;  Location: [place];  Service: Thoracic;  Laterality: Right;   TALC PLEURODESIS Right 11/27/2014   Procedure: Pietro Cassis;  Surgeon: [name], MD;  Location: [place];  Service: Thoracic;  Laterality: Right;   VIDEO ASSISTED THORACOSCOPY Right 11/27/2014   Procedure: RIGHT VIDEO ASSISTED THORACOSCOPY;  Surgeon: [name], MD;  Location: [place];  Service: Thoracic;  Laterality: Right;    Current Outpatient Medications  Medication Sig Dispense Refill   albuterol (VENTOLIN HFA) 108 (90 Base) MCG/ACT inhaler INHALE 2 PUFFS BY MOUTH EVERY 6 HOURS AS NEEDED FOR WHEEZING FOR SHORTNESS OF BREATH 18 g 11   atorvastatin (LIPITOR) 80 MG tablet TAKE 1 TABLET BY MOUTH ONCE DAILY AT 6PM 90 tablet 0   carvedilol (COREG) 12.5 MG tablet Take 1 tablet by mouth twice daily 180 tablet 2   citalopram (CELEXA) 20 MG tablet Take 20 mg by mouth daily.     clopidogrel (PLAVIX) 75 MG tablet Take 1 tablet by mouth once daily 90 tablet 0   diphenoxylate-atropine (LOMOTIL) 2.5-0.025 MG tablet Take 1 tablet by mouth 4 (four) times daily as needed for diarrhea or loose stools. 30 tablet 0   furosemide (LASIX) 20 MG tablet Take 1 tablet (20 mg total) by mouth every other day. 45 tablet 3   meclizine (ANTIVERT) 25 MG tablet Take 1 tablet (25 mg total) by mouth 3 (three) times daily as needed for dizziness. 30 tablet 0   mexiletine (MEXITIL) 200 MG capsule Take 1 capsule by mouth twice daily 60 capsule 3   nilotinib (TASIGNA) 150 MG capsule TAKE 2 CAPSULES (300 MG TOTAL) BY MOUTH EVERY 12  (TWELVE) HOURS. TAKE ON AN EMPTY STOMACH, 1 HR BEFORE OR 2 HRS AFTER FOOD. 112 capsule 12   nitroGLYCERIN (NITROSTAT) 0.4 MG SL tablet Place 1 tablet (0.4 mg total) under the tongue every 5 (five) minutes as needed for chest pain. 25 tablet 4   ondansetron (ZOFRAN) 4 MG tablet Take 1 tablet (4 mg total) by mouth every 8 (eight) hours as needed for nausea or vomiting. 30 tablet 3   sacubitril-valsartan (ENTRESTO) 24-26 MG Take 1 tablet by mouth 2 (two) times daily. 180 tablet 3   sildenafil (VIAGRA) 100 MG tablet Take 0.5-1 tablets (50-100 mg total) by mouth daily as needed for erectile dysfunction. 5 tablet 11   No current facility-administered medications  for this visit.    Allergies:   Breztri aerosphere [budeson-glycopyrrol-formoterol], Norvasc [amlodipine besylate], and Sulfa antibiotics   Social History:  The patient  reports that he quit smoking about 24 years ago. His smoking use included cigarettes. He has a 135.00 pack-year smoking history. He has quit using smokeless tobacco. He reports that he does not drink alcohol and does not use drugs.   Family History:  The patient's family history includes Alcohol abuse in his father; Heart disease in his mother.  ROS:  Please see the history of present illness.  All other systems are reviewed and otherwise negative.   PHYSICAL EXAM:  VS:  There were no vitals taken for this visit. BMI: There is no height or weight on file to calculate BMI. Well nourished, well developed, in no acute distress  HEENT: normocephalic, atraumatic  Neck: no JVD, carotid bruits or masses Cardiac:  RRR; no significant murmurs, no rubs, or gallops Lungs:  CTA b/l, no wheezing, rhonchi or rales  Abd: soft, nontender MS: no deformity, age appropriate atrophy Ext:   no edema  Skin: warm and dry, no rash Neuro:  No gross deficits appreciated Psych: euthymic mood, full affect  ICD site is stable, no tethering or discomfort   EKG:   done today and reviewed by  myself: SR 72pm, IVCD/RBBB   ICD interrogation done today and reviewed by myself:  Battery and lead measurements are good No VT since November  05/28/2020: TTE IMPRESSIONS   1. Left ventricular ejection fraction, by estimation, is 40 to 45%. The  left ventricle has mildly decreased function. The left ventricle  demonstrates global hypokinesis. There is moderate left ventricular  hypertrophy. Left ventricular diastolic  parameters are indeterminate.   2. Right ventricular systolic function is normal. The right ventricular  size is normal. There is normal pulmonary artery systolic pressure.   3. Left atrial size was severely dilated.   4. The mitral valve is normal in structure. Mild mitral valve  regurgitation. No evidence of mitral stenosis.   5. The aortic valve is tricuspid. Aortic valve regurgitation is mild. No  aortic stenosis is present.   6. The inferior vena cava is normal in size with greater than 50%  respiratory variability, suggesting right atrial pressure of 3 mmHg.    04/13/17: lexiscan stress test Nuclear stress EF: 35%. Inferior/inferoseptal defect consistent iwht scar No significant ischemia Intermediate risk study  06/07/16: TTE Study Conclusions - Left ventricle: Global longitudinal LV strain is abnormal at -12%   The cavity size was normal. There was moderate focal basal and   mild concentric hypertrophy. Systolic function was mildly   reduced. The estimated ejection fraction was in the range of 45%   to 50%. There is akinesis of the basal-midinferior myocardium. - Aortic valve: Mildly to moderately calcified annulus. Trileaflet;   mildly thickened, mildly calcified leaflets. - Aorta: Aortic root dimension: 38 mm (ED). - Aortic root: The aortic root was mildly dilated. - Mitral valve: There was trivial regurgitation. - Left atrium: The atrium was mildly to moderately dilated. - Right ventricle: The cavity size was mildly dilated. Wall   thickness was  normal. - Tricuspid valve: There was trivial regurgitation.   11/14/16: Carotid US IMPRESSION: 1. Surgical changes of prior right internal carotid endarterectomy with mild residual heterogeneous atherosclerotic plaque. By peak systolic velocity criteria, the estimated stenosis falls in the 1 - 49% range. 2. Chronic occlusion of the left internal carotid artery secondary to bulky heterogeneous and partially calcified  atherosclerotic plaque. 3. The vertebral arteries are patent with normal antegrade flow bilaterally.     Recent Labs: 08/25/2021: ALT 8; BUN 9; Creatinine, Ser 0.81; Hemoglobin 10.6; Platelets 310; Potassium 3.6; Sodium 144  02/18/2021: Cholesterol 116; HDL 36; LDL Cholesterol (Calc) 63; Total CHOL/HDL Ratio 3.2; Triglycerides 90   CrCl cannot be calculated (Patient's most recent lab result is older than the maximum 21 days allowed.).   Wt Readings from Last 3 Encounters:  09/14/21 176 lb (79.8 kg)  08/20/21 168 lb (76.2 kg)  08/10/21 175 lb (79.4 kg)     Other studies reviewed: Additional studies/records reviewed today include: summarized above  ASSESSMENT AND PLAN:  1. ICD     Intact function, no changes made   2. VT     On mexiletine/BB     None further, was associated with exceptionally high exertional work  3. ICM, chronic CHF (systolic)     No symptoms or exam findings of volume OL     OptiVol was up, most likely 2/2 his COVID pneumonia, trending back down     On BB, Entresto, PRN diuretic   4. CAD     No ischemia on 2018 stress testing     No anginal complaints     on Plavix with CAD/PVD, BB, statin tx     Discussed need for general/interventional cardiologist, he is agreeable     Will refer to Dr. Hillery Hunter  5. HTN     Hasn't taken his medicines today   6. Carotid disease They report he follows with Dr. Donnetta Hutching    Disposition: remotes    Current medicines are reviewed at length with the patient today.  The patient did not have any concerns  regarding medicines.  Venetia Night, PA-C 09/22/2021 9:43 AM     [address] [place] [id number] [phone] (office)  [phone] (fax)

## 2021-09-23 ENCOUNTER — Telehealth: Payer: Self-pay | Admitting: Pharmacist

## 2021-09-23 NOTE — Progress Notes (Signed)
-    Chronic Care Management Pharmacy Assistant   Name: Anthony Wolfe  MRN: [id number] DOB: [birth year]   Reason for Encounter: Disease State - General Adherence Call     Recent office visits:  08/20/21 Anthony Wolfe, [place] [facility] - No medication changes. Follow up as scheduled.   08/06/21 Anthony Wolfe, [place] - Chest xray ordered. amoxicillin-clavulanate (AUGMENTIN) 875-125 MG tablet and azithromycin (ZITHROMAX) 250 MG tablet prescribed. Patient was taken to ED after visit for further evaluation.   Recent consult visits:  09/14/21 Anthony Lark, MD - Oncology - No changes. Follow up in 3 months as scheduled.   Hospital visits: 08/10/21 Medication Reconciliation was completed by comparing discharge summary, patients EMR and Pharmacy list, and upon discussion with patient.  Admitted to the hospital on 08/10/21 due to Dehydration. Discharge date was 08/10/21. Discharged from [place]?Medications Started at [facility] Discharge:?? None noted.   Medication Changes at Hospital Discharge: None noted.   Medications Discontinued at Hospital Discharge: None noted.  Medications that remain the same after Hospital Discharge:??  All other medications will remain the same.     Hospital visits: 08/06/21 Medication Reconciliation was completed by comparing discharge summary, patients EMR and Pharmacy list, and upon discussion with patient.  Admitted to the hospital on 08/06/21 due to Community Acquired Pneumonia. Discharge date was 08/06/21. Discharged from [place]?Medications Started at [facility] Discharge:?? None noted  Medication Changes at Hospital Discharge: amoxicillin-clavulanate 875-125 MG tablet azithromycin 250 MG tablet   Medications Discontinued at Hospital Discharge: None noted.  Medications that remain the same after Hospital Discharge:??  All other medications will remain the same.     Medications: Outpatient Encounter Medications as of 09/23/2021  Medication Sig   albuterol (VENTOLIN HFA) 108 (90 Base) MCG/ACT inhaler INHALE 2 PUFFS BY MOUTH EVERY 6 HOURS AS NEEDED FOR WHEEZING FOR SHORTNESS OF BREATH   atorvastatin (LIPITOR) 80 MG tablet TAKE 1 TABLET BY MOUTH ONCE DAILY AT 6PM   carvedilol (COREG) 12.5 MG tablet Take 1 tablet by mouth twice daily   citalopram (CELEXA) 20 MG tablet Take 20 mg by mouth daily.   clopidogrel (PLAVIX) 75 MG tablet Take 1 tablet by mouth once daily   diphenoxylate-atropine (LOMOTIL) 2.5-0.025 MG tablet Take 1 tablet by mouth 4 (four) times daily as needed for diarrhea or loose stools.   furosemide (LASIX) 20 MG tablet Take 1 tablet (20 mg total) by mouth every other day.   meclizine (ANTIVERT) 25 MG tablet Take 1 tablet (25 mg total) by mouth 3 (three) times daily as needed for dizziness.   mexiletine (MEXITIL) 200 MG capsule Take 1 capsule by mouth twice daily   nilotinib (TASIGNA) 150 MG capsule TAKE 2 CAPSULES (300 MG TOTAL) BY MOUTH EVERY 12 (TWELVE) HOURS. TAKE ON AN EMPTY STOMACH, 1 HR BEFORE OR 2 HRS AFTER FOOD.   nitroGLYCERIN (NITROSTAT) 0.4 MG SL tablet Place 1 tablet (0.4 mg total) under the tongue every 5 (five) minutes as needed for chest pain.   ondansetron (ZOFRAN) 4 MG tablet Take 1 tablet (4 mg total) by mouth every 8 (eight) hours as needed for nausea or vomiting.   sacubitril-valsartan (ENTRESTO) 24-26 MG Take 1 tablet by mouth 2 (two) times daily.   sildenafil (VIAGRA) 100 MG tablet Take 0.5-1 tablets (50-100 mg total) by mouth daily as needed for erectile dysfunction.   No facility-administered encounter medications on  file as of 09/23/2021.    Have you had any problems recently with your health?   Have you had any problems with your pharmacy?   What issues or side effects are you having with your medications?   What would you like me to pass along to Leata Mouse, CPP for them to help you with?    What can we do  to take care of you better?   Care Gaps  AWV: overdue Colonoscopy: unknown  DM Eye Exam: N/A DM Foot Exam: N/A Microalbumin: N/A HbgAIC: N/A DEXA:  N/A Mammogram: N/A  Star Rating Drugs: atorvastatin (LIPITOR) 80 MG tablet - last filled 07/14/21 90 days  sacubitril-valsartan (ENTRESTO) 24-26 MG - last filled 08/02/21 30 days   Future Appointments  Date Time Provider [place]  09/27/2021  8:25 AM Baldwin Jamaica, PA-C [facility]  10/14/2021 12:30 PM [facility] PHARMACIST [facility] None  11/29/2021 10:25 AM [facility] DEVICE REMOTES [facility]  12/13/2021  9:00 AM [facility] LAB [facility] None  12/23/2021  9:00 AM Anthony Lark, MD [facility] None  02/28/2022 10:25 AM [facility] DEVICE REMOTES [facility]  05/30/2022 10:25 AM [facility] DEVICE REMOTES [facility]  08/29/2022 10:25 AM [facility] DEVICE REMOTES [facility]  11/28/2022 10:25 AM [facility] DEVICE REMOTES [facility]   Multiple attempts were made to contact patient. Attempts were unsuccessful. / ls,CMA  Jobe Gibbon, [place] Pharmacist Assistant  [phone]

## 2021-09-27 ENCOUNTER — Other Ambulatory Visit: Payer: Self-pay

## 2021-09-27 ENCOUNTER — Ambulatory Visit: Payer: Medicare HMO | Admitting: Physician Assistant

## 2021-09-27 ENCOUNTER — Encounter: Payer: Self-pay | Admitting: Physician Assistant

## 2021-09-27 VITALS — BP 150/78 | HR 72 | Ht 69.0 in | Wt 175.0 lb

## 2021-09-27 DIAGNOSIS — Z79899 Other long term (current) drug therapy: Secondary | ICD-10-CM

## 2021-09-27 DIAGNOSIS — I5022 Chronic systolic (congestive) heart failure: Secondary | ICD-10-CM

## 2021-09-27 DIAGNOSIS — I2510 Atherosclerotic heart disease of native coronary artery without angina pectoris: Secondary | ICD-10-CM

## 2021-09-27 DIAGNOSIS — Z95810 Presence of automatic (implantable) cardiac defibrillator: Secondary | ICD-10-CM | POA: Diagnosis not present

## 2021-09-27 DIAGNOSIS — I255 Ischemic cardiomyopathy: Secondary | ICD-10-CM | POA: Diagnosis not present

## 2021-09-27 DIAGNOSIS — I472 Ventricular tachycardia, unspecified: Secondary | ICD-10-CM

## 2021-09-27 LAB — CUP PACEART INCLINIC DEVICE CHECK
Battery Remaining Longevity: 45 mo
Battery Voltage: 2.96 V
Brady Statistic RV Percent Paced: 0.03 %
Date Time Interrogation Session: 20230220142203
HighPow Impedance: 44 Ohm
Implantable Lead Implant Date: 20151014
Implantable Lead Location: 753860
Implantable Pulse Generator Implant Date: 20151014
Lead Channel Impedance Value: 399 Ohm
Lead Channel Impedance Value: 418 Ohm
Lead Channel Pacing Threshold Amplitude: 0.75 V
Lead Channel Pacing Threshold Pulse Width: 0.4 ms
Lead Channel Sensing Intrinsic Amplitude: 3.125 mV
Lead Channel Sensing Intrinsic Amplitude: 3.875 mV
Lead Channel Setting Pacing Amplitude: 2 V
Lead Channel Setting Pacing Pulse Width: 0.4 ms
Lead Channel Setting Sensing Sensitivity: 0.3 mV

## 2021-09-27 LAB — BASIC METABOLIC PANEL
BUN/Creatinine Ratio: 13 (ref 10–24)
BUN: 10 mg/dL (ref 8–27)
CO2: 24 mmol/L (ref 20–29)
Calcium: 8.5 mg/dL — ABNORMAL LOW (ref 8.6–10.2)
Chloride: 108 mmol/L — ABNORMAL HIGH (ref 96–106)
Creatinine, Ser: 0.76 mg/dL (ref 0.76–1.27)
Glucose: 96 mg/dL (ref 70–99)
Potassium: 4.2 mmol/L (ref 3.5–5.2)
Sodium: 144 mmol/L (ref 134–144)
eGFR: 93 mL/min/{1.73_m2} (ref >59)

## 2021-09-27 LAB — MAGNESIUM: Magnesium: 1.9 mg/dL (ref 1.6–2.3)

## 2021-09-27 MED ORDER — FUROSEMIDE 20 MG PO TABS
20.0000 mg | ORAL_TABLET | ORAL | 1 refills | Status: DC | PRN
Start: 2021-09-27 — End: 2022-10-27

## 2021-09-27 NOTE — Patient Instructions (Signed)
°   Medication Instructions:   Your physician recommends that you continue on your current medications as directed. Please refer to the Current Medication list given to you today.   *If you need a refill on your cardiac medications before your next appointment, please call your pharmacy*   Lab Work: [facility] AND [place]   If you have labs (blood work) drawn today and your tests are completely normal, you will receive your results only by: [place] (if you have MyChart) OR A paper copy in the mail If you have any lab test that is abnormal or we need to change your treatment, we will call you to review the results.   Testing/Procedures: NONE ORDERED  TODAY     Follow-Up: At [facility], you and your health needs are our priority.  As part of our continuing mission to provide you with exceptional heart care, we have created designated Provider Care Teams.  These Care Teams include your primary Cardiologist (physician) and Advanced Practice Providers (APPs -  Physician Assistants and Nurse Practitioners) who all work together to provide you with the care you need, when you need it.  We recommend signing up for the patient portal called "MyChart".  Sign up information is provided on this After Visit Summary.  MyChart is used to connect with patients for Virtual Visits (Telemedicine).  Patients are able to view lab/test results, encounter notes, upcoming appointments, etc.  Non-urgent messages can be sent to your provider as well.   To learn more about what you can do with MyChart, go to [email or web address].    Your next appointment:  FIRST AVAILABLE WITH DR [facility] AS NEW PATIENT FOR CAD 6 month(s)  The format for your next appointment:   In Person  Provider:   Virl Axe, MD    Other Instructions

## 2021-09-29 ENCOUNTER — Other Ambulatory Visit: Payer: Self-pay

## 2021-09-29 ENCOUNTER — Other Ambulatory Visit: Payer: Self-pay | Admitting: Family Medicine

## 2021-09-30 ENCOUNTER — Telehealth: Payer: Self-pay | Admitting: *Deleted

## 2021-09-30 NOTE — Telephone Encounter (Signed)
-----   Message from Baldwin Jamaica, [place] sent at 09/28/2021 12:56 PM EST ----- Labs looked OK, no new recommendations

## 2021-09-30 NOTE — Telephone Encounter (Signed)
Spoke with patient and aware of results and verbalized understanding

## 2021-10-11 NOTE — Progress Notes (Signed)
Chronic Care Management Pharmacy Note  10/14/2021 Name:  Anthony Wolfe MRN:  [id number] DOB:  [birth year]  Summary: PharmD Fu visit.  Saw cardiology this AM and they added Jardiance and increased Carvedilol.  Also considering stopping Entresto pending upcoming echo.  Will help assist with copays where possible.  Denies any recent swelling or SOB.   Subjective: Anthony Wolfe is an 76 y.o. year old male who is a primary patient of Pickard, Cammie Mcgee, MD.  The CCM team was consulted for assistance with disease management and care coordination needs.    Engaged with patient face to face for follow up visit in response to provider referral for pharmacy case management and/or care coordination services.   Consent to Services:  The patient was given the following information about Chronic Care Management services today, agreed to services, and gave verbal consent: 1. CCM service includes personalized support from designated clinical staff supervised by the primary care provider, including individualized plan of care and coordination with other care providers 2. 24/7 contact phone numbers for assistance for urgent and routine care needs. 3. Service will only be billed when office clinical staff spend 20 minutes or more in a month to coordinate care. 4. Only one practitioner may furnish and bill the service in a calendar month. 5.The patient may stop CCM services at any time (effective at the end of the month) by phone call to the office staff. 6. The patient will be responsible for cost sharing (co-pay) of up to 20% of the service fee (after annual deductible is met). Patient agreed to services and consent obtained.  Patient Care Team: [name], MD as PCP - General (Family Medicine) Early Osmond, MD as PCP - Cardiology (Cardiology) [name]., MD as Attending Physician (Cardiology) [name], MD (Family Medicine) [name], MD (Cardiothoracic Surgery) Early,  Arvilla Meres, MD as Attending Physician (Vascular Surgery) [name], MD as Consulting Physician (Hematology and Oncology) Deboraha Sprang, MD as Consulting Physician (Cardiology) [name], [facility] as Pharmacist (Pharmacist) [name], [facility] as Pharmacist (Pharmacist)  Recent office visits:  06/03/2021 Adele Dan, MD;  My biggest concern is keeping him on the [facility].  Therefore I gave him samples to last a month and I will set him up to meet with our clinical pharmacist to see if he can qualify for any prescription assistance programs.  If not we will transition him back to valsartan.  Start the patient on Celexa 20 mg a day   02/18/2021 OV (PCP) [name], MD;  I have asked the patient to check his blood pressure at home and if his systolic blood pressures consistently in the 140s I would like to increase the strength of the Entresto.   Recent consult visits:  02/01/2021 OV (Oncology) [name], MD; no medication changes indicated.   Hospital visits:  None in previous 6 months  Objective:  Lab Results  Component Value Date   CREATININE 0.76 09/27/2021   BUN 10 09/27/2021   GFR 60.62 06/23/2014   GFRNONAA >60 08/25/2021   GFRAA >60 04/24/2020   NA 144 09/27/2021   K 4.2 09/27/2021   CALCIUM 8.5 (L) 09/27/2021   CO2 24 09/27/2021   GLUCOSE 96 09/27/2021    Lab Results  Component Value Date/Time   HGBA1C 5.1 05/31/2019 08:37 AM   HGBA1C 5.5 08/12/2013 03:33 PM   GFR 60.62 06/23/2014 03:18 PM   GFR 55.47 (L) 06/16/2014 03:23  PM    Last diabetic Eye exam: No results found for: HMDIABEYEEXA  Last diabetic Foot exam: No results found for: HMDIABFOOTEX   Lab Results  Component Value Date   CHOL 116 02/18/2021   HDL 36 (L) 02/18/2021   LDLCALC 63 02/18/2021   TRIG 90 02/18/2021   CHOLHDL 3.2 02/18/2021    Hepatic Function Latest Ref Rng & Units 08/25/2021 08/10/2021 05/25/2021  Total Protein 6.5 - 8.1 g/dL 6.4(L) 5.8(L) 7.0  Albumin 3.5 - 5.0  g/dL 3.2(L) 2.5(L) 3.5  AST 15 - 41 U/L 11(L) 14(L) 12(L)  ALT 0 - 44 U/L '8 13 11  ' Alk Phosphatase 38 - 126 U/L 73 60 107  Total Bilirubin 0.3 - 1.2 mg/dL 0.5 0.7 0.5  Bilirubin, Direct 0.00 - 0.40 mg/dL - - -    Lab Results  Component Value Date/Time   TSH 2.510 03/15/2017 11:29 AM   TSH 2.03 06/02/2014 03:13 PM    CBC Latest Ref Rng & Units 08/25/2021 08/10/2021 08/06/2021  WBC 4.0 - 10.5 K/uL 9.0 7.0 4.9  Hemoglobin 13.0 - 17.0 g/dL 10.6(L) 9.5(L) 10.8(L)  Hematocrit 39.0 - 52.0 % 33.5(L) 30.3(L) 34.0(L)  Platelets 150 - 400 K/uL 310 166 154    No results found for: VD25OH  Clinical ASCVD: Yes  The ASCVD Risk score (Arnett DK, et al., 2019) failed to calculate for the following reasons:   The patient has a prior MI or stroke diagnosis   Financial Resource Strain: Low Risk    Difficulty of Paying Living Expenses: Not very hard    Depression screen [facility] 2/9 06/03/2021 02/18/2021 10/21/2020  Decreased Interest 1 0 0  Down, Depressed, Hopeless 2 0 0  PHQ - 2 Score 3 0 0  Altered sleeping 2 - -  Tired, decreased energy 2 - -  Change in appetite 0 - -  Feeling bad or failure about yourself  2 - -  Trouble concentrating 2 - -  Moving slowly or fidgety/restless 2 - -  Suicidal thoughts 0 - -  PHQ-9 Score 13 - -  Difficult doing work/chores Very difficult - -  Some recent data might be hidden     Social History   Tobacco Use  Smoking Status Former   Packs/day: 3.00   Years: 45.00   Pack years: 135.00   Types: Cigarettes   Quit date: 08/08/1997   Years since quitting: 24.2   Passive exposure: Past  Smokeless Tobacco Former   BP Readings from Last 3 Encounters:  10/14/21 (!) 146/60  09/27/21 (!) 150/78  09/14/21 (!) 152/76   Pulse Readings from Last 3 Encounters:  10/14/21 83  09/27/21 72  09/14/21 76   Wt Readings from Last 3 Encounters:  10/14/21 180 lb 3.2 oz (81.7 kg)  09/27/21 175 lb (79.4 kg)  09/14/21 176 lb (79.8 kg)   BMI Readings from Last 3  Encounters:  10/14/21 26.61 kg/m  09/27/21 25.84 kg/m  09/14/21 25.99 kg/m    Assessment/Interventions: Review of patient past medical history, allergies, medications, health status, including review of consultants reports, laboratory and other test data, was performed as part of comprehensive evaluation and provision of chronic care management services.   SDOH:  (Social Determinants of Health) assessments and interventions performed: Yes  Financial Resource Strain: Low Risk    Difficulty of Paying Living Expenses: Not very hard    SDOH Screenings   Alcohol Screen: Low Risk    Last Alcohol Screening Score (AUDIT): 0  Depression (PHQ2-9): Medium Risk  PHQ-2 Score: 13  Financial Resource Strain: Low Risk    Difficulty of Paying Living Expenses: Not very hard  Food Insecurity: No Food Insecurity   Worried About Charity fundraiser in the Last Year: Never true   Ran Out of Food in the Last Year: Never true  Housing: Low Risk    Last Housing Risk Score: 0  Physical Activity: Sufficiently Active   Days of Exercise per Week: 5 days   Minutes of Exercise per Session: 30 min  Social Connections: Moderately Integrated   Frequency of Communication with Friends and Family: More than three times a week   Frequency of Social Gatherings with Friends and Family: Twice a week   Attends Religious Services: More than 4 times per year   Active Member of [employer] or Organizations: No   Attends Music therapist: Not on file   Marital Status: Married  Stress: No Stress Concern Present   Feeling of Stress : Only a little  Tobacco Use: Medium Risk   Smoking Tobacco Use: Former   Smokeless Tobacco Use: Former   Passive Exposure: Past  Transportation Needs: No Data processing manager (Medical): No   Lack of Transportation (Non-Medical): No    CCM Care Plan  Allergies  Allergen Reactions   Breztri Aerosphere [Budeson-Glycopyrrol-Formoterol] Swelling     Swelling of mouth tongue    Norvasc [Amlodipine Besylate] Rash    rash   Sulfa Antibiotics Hives    Hives     Medications Reviewed Today     Reviewed by [name], [facility] (Pharmacist) on 10/14/21 at 47  Med List Status: <None>   Medication Order Taking? Sig Documenting Provider Last Dose Status Informant  albuterol (VENTOLIN HFA) 108 (90 Base) MCG/ACT inhaler [id number] Yes INHALE 2 PUFFS BY MOUTH EVERY 6 HOURS AS NEEDED FOR WHEEZING FOR SHORTNESS OF Montel Clock, MD Taking Active   atorvastatin (LIPITOR) 80 MG tablet [id number] Yes TAKE 1 TABLET BY MOUTH ONCE DAILY AT  6  PM [name], MD Taking Active     Discontinued 10/14/21 1003 (Dose change)   carvedilol (COREG) 25 MG tablet [id number] Yes Take 1 tablet (25 mg total) by mouth 2 (two) times daily. Early Osmond, MD Taking Active   citalopram (CELEXA) 20 MG tablet [id number] Yes Take 20 mg by mouth daily. [provider] Taking Active   clopidogrel (PLAVIX) 75 MG tablet [id number] Yes Take 1 tablet by mouth once daily [name], MD Taking Active   diphenoxylate-atropine (LOMOTIL) 2.5-0.025 MG tablet [id number] Yes Take 1 tablet by mouth 4 (four) times daily as needed for diarrhea or loose stools. [name], MD Taking Active   empagliflozin (JARDIANCE) 10 MG TABS tablet [id number] Yes Take 1 tablet (10 mg total) by mouth daily before breakfast. Early Osmond, MD Taking Active   furosemide (LASIX) 20 MG tablet [id number] Yes Take 1 tablet (20 mg total) by mouth as needed for fluid or edema. Baldwin Jamaica, PA-C Taking Active   meclizine (ANTIVERT) 25 MG tablet [id number] Yes Take 1 tablet (25 mg total) by mouth 3 (three) times daily as needed for dizziness. [name], MD Taking Active   mexiletine (MEXITIL) 200 MG capsule [id number] Yes Take 1 capsule by mouth twice daily [name], MD Taking Active   nilotinib (TASIGNA) 150 MG capsule [id number] Yes TAKE 2 CAPSULES (300 MG TOTAL)  BY MOUTH EVERY 12 (TWELVE) HOURS. TAKE ON AN EMPTY STOMACH,  1 HR BEFORE OR 2 HRS AFTER FOOD. [name], MD Taking Active   nitroGLYCERIN (NITROSTAT) 0.4 MG SL tablet [id number] Yes Place 1 tablet (0.4 mg total) under the tongue every 5 (five) minutes as needed for chest pain. Isaiah Serge, NP Taking Active   ondansetron [facility]) 4 MG tablet [id number] Yes Take 1 tablet (4 mg total) by mouth every 8 (eight) hours as needed for nausea or vomiting. [name], MD Taking Active   sacubitril-valsartan (ENTRESTO) 24-26 MG [id number] Yes Take 1 tablet by mouth 2 (two) times daily. [name], MD Taking Active   sildenafil (VIAGRA) 100 MG tablet [id number] Yes Take 0.5-1 tablets (50-100 mg total) by mouth daily as needed for erectile dysfunction. 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   Chronic anemia 09/21/2018   Anemia due to antineoplastic chemotherapy 11/17/2015   Protein-calorie malnutrition, mild ([place]) 11/17/2015   Congestive heart failure with cardiomyopathy ([place]) 06/25/2015   Chest skin lesion 04/06/2015   Pleural effusion on left 03/25/2015   Diarrhea due to drug 03/17/2015   Bradycardia 10/23/2014   Orthostatic hypotension 06/23/2014   Syncope 06/23/2014   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   CAD (coronary artery disease) of artery bypass graft, occluded VG-RCA 05/23/2014   NSTEMI (non-ST elevated myocardial infarction) ([place]) 05/21/2014   Cardiomyopathy, ischemic 05/21/2014   Ventricular tachycardia 05/16/2014   Muscle spasm 05/28/2013   PVCs (premature ventricular contractions) 05/23/2013   Carotid disease, bilateral ([place])    Hyperlipidemia    HTN (hypertension)    Allergic rhinitis    Prostate hypertrophy    Anxiety    CAD (coronary artery  disease), hx of CABG    TIA (transient ischemic attack)    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    Immunization History  Administered Date(s) Administered   Fluad Quad(high Dose 65+) 05/14/2019   Influenza, High Dose Seasonal PF 06/22/2018   Influenza,inj,Quad PF,6+ Mos 05/21/2013, 05/07/2014, 04/28/2015, 04/28/2016, 05/09/2017   Pneumococcal Conjugate-13 01/29/2015   Pneumococcal Polysaccharide-23 05/07/2014    Conditions to be addressed/monitored:  CAD, HTN, HX of TIA, CHF, COPD, Allergic Rhinitis, HLD, Anxiety  Care Plan : General Pharmacy (Adult)  Updates made by [name], RPH since 10/14/2021 12:00 AM     Problem: CAD, HTN, HX of TIA, CHF, COPD, Allergic Rhinitis, HLD, Anxiety   Priority: High  Onset Date: 07/15/2021     Long-Range Goal: Patient-Specific Goal   Start Date: 07/15/2021  Expected End Date: 01/13/2022  Recent Progress: On track  Priority: High  Note:   Current Barriers:  Unable to independently afford treatment regimen Unable to achieve control of BP  Suboptimal therapeutic regimen for COPD  Pharmacist Clinical Goal(s):  Patient will verbalize ability to afford treatment regimen achieve control of BP as evidenced by home monitoring adhere to plan to optimize therapeutic regimen for COPD as evidenced by report of adherence to recommended medication management changes through collaboration with PharmD and provider.   Interventions: 1:1 collaboration with [name], MD regarding development and update of comprehensive plan of care as evidenced by provider attestation and co-signature Inter-disciplinary care team collaboration (see longitudinal plan of care) Comprehensive medication review performed; medication list updated in electronic medical record  Hypertension (BP goal <130/80) -Uncontrolled -Current  treatment: Carvedilol 66m BID -Medications previously tried: lisinopril, losartan, Toprol XL, propranolol, valsartan,  verapamil  -Current home readings: 148/71 today, no other home readings provided - does have access to cuff whenever he needs -Current dietary habits: HIGH salt intake, eats [employer] for breakfast every morning, other meals are home cooked by his wife.  She cooks a lot of vegetables from garden and will add lean meats in sometimes.  Not much hamburger.  Does add salt to foods at home. -Current exercise habits: loves to walk around [employer], hunts regularly, active in garden during the summer months -Denies hypotensive/hypertensive symptoms - did have some dizziness a few weeks ago which has resolved. -Educated on BP goals and benefits of medications for prevention of heart attack, stroke and kidney damage; Daily salt intake goal < 2300 mg; Importance of home blood pressure monitoring; Symptoms of hypotension and importance of maintaining adequate hydration; -Counseled to monitor BP at home daily, document, and provide log at future appointments -Counseled on diet and exercise extensively Recommended to continue current medication Recommended he monitor BP at home twice daily for 2 weeks.  Will have CMA call for BP check at that point and report to me. Adjust meds possibly at that time.  Hyperlipidemia: (LDL goal < 70) -Controlled -Current treatment: Atorvastatin 819mdaily Appropriate, Effective, Safe, Accessible -Medications previously tried: none noted  -Current dietary patterns: see HTN -Current exercise habits: see HTN -Educated on Cholesterol goals;  Benefits of statin for ASCVD risk reduction; Importance of limiting foods high in cholesterol; -Recommended to continue current medication Most recent LDL is at goal, reports adherence with meds.  Update 10/14/21 High risk - high intensity statin is appropriate LDL at goal < 70. Recommend recheck lipids at 1 year from previous No changes at this time reinforced adherence.  Heart Failure (Goal: manage symptoms and prevent  exacerbations) -Controlled -Last ejection fraction: 40-45% -NYHA Class: II (slight limitation of activity) -AHA HF Stage: C (Heart disease and symptoms present) -Current treatment: Jardiance 107maily Appropriate, Effective, Safe, Accessible Furosemide 16m70mery other day - he is only taking PRN Appropriate, Effective, Safe, Accessible Carvedilol 25mg19m Appropriate, Effective, Safe, Accessible Entresto 24-26 mg BID Appropriate, Effective, Safe, Accessible -Medications previously tried: losartan  -Current home BP/HR readings: 148/71, pulse unknown -Current dietary habits: see HTN - high salt content -Current exercise habits: see HTN -Educated on Importance of weighing daily; if you gain more than 3 pounds in one day or 5 pounds in one week, contact providers Importance of blood pressure control He does report symptoms of SOB, denies any visible swelling.  Take his lasix prn based on how much he is urinating. -Recommended to continue current medication Assessed patient finances. They are having issues with copay on Entresto, brought in taxes today and we will complete application and fax in.  Would prefer he stays on this med if at all possible. Recommended he weigh himself daily to determine fluid status and to see if Lasix is needed. Will FU once application for EntreDelene Lollubmitted.  Update 10/14/21 He has a visit with cardiologist this morning.  They scheduled an echocardiogram.  The plan was if EF was > 35% to stop Entresto and switch to Losartan.  They went ahead and started patient on Jardiance 10mg 35my.  He will begin taking tomorrow. Discussed the cost barriers that may come up with this, will leave application for BI cares at front desk he can pick up when he comes for PCP appointment. He denies any  swelling or SOB - besides when he over exerts himself. No changes at this time - will FU after Echo which is scheduled for the 22nd.   COPD (Goal: control symptoms and prevent  exacerbations) -Uncontrolled -Current treatment  Albuterol HFA 35mg prn -Medications previously tried: Breztri - swelling of mouth and tongue  -Current COPD Classification:  B (high sx, <2 exacerbations/yr) -Pulmonary function testing:Pulmonary Functions Testing Results: No results found for: FEV1, FVC, FEV1FVC, TLC, DLCO -Exacerbations requiring treatment in last 6 months: none -Patient denies consistent use of maintenance inhaler -Frequency of rescue inhaler use: 3-4 times per day -Counseled on Benefits of consistent maintenance inhaler use When to use rescue inhaler Differences between maintenance and rescue inhalers Possibility that mouth swelling was ICS induced thrush. -Recommended to continue current medication Based on symptom level I suspect patient would benefit from maintenance therapy for his COPD. As cost is an issue we will look at options with easier PAP programs.   Recommend Stiolto Respimat due to ease of delivery and application process.  Will discuss with PCP.   Patient Goals/Self-Care Activities Patient will:  - take medications as prescribed as evidenced by patient report and record review focus on medication adherence by pill box check blood pressure daily, document, and provide at future appointments weigh daily, and contact provider if weight gain of 3 lbs in one day or 5 lbs in one week  Follow Up Plan: The care management team will reach out to the patient again over the next 90 days.            Medication Assistance: Application for Stiolto/Entresto  medication assistance program. in process.  Anticipated assistance start date unknown.  See plan of care for additional detail.  Compliance/Adherence/Medication fill history: Care Gaps: Zoster Vaccine  Star-Rating Drugs: Atorvastatin 848mdaily 04/16/21 90ds  [facility]  [facility] [place] - [address] [place] [id number]: [phone]: [phone] Uses pill box? Yes Pt endorses 100% compliance  We discussed: Benefits of medication synchronization, packaging and delivery as well as enhanced pharmacist oversight with [employer]. Patient decided to: Continue current medication management strategy  Care Plan and Follow Up Patient Decision:  Patient agrees to Care Plan and Follow-up.  Plan: The care management team will reach out to the patient again over the next 90 days.  ChBeverly MilchPharmD, CPP Clinical Pharmacist Practitioner [phone]

## 2021-10-14 ENCOUNTER — Encounter: Payer: Self-pay | Admitting: Internal Medicine

## 2021-10-14 ENCOUNTER — Ambulatory Visit (INDEPENDENT_AMBULATORY_CARE_PROVIDER_SITE_OTHER): Payer: Medicare HMO | Admitting: Family Medicine

## 2021-10-14 ENCOUNTER — Ambulatory Visit (INDEPENDENT_AMBULATORY_CARE_PROVIDER_SITE_OTHER): Payer: Medicare HMO | Admitting: Pharmacist

## 2021-10-14 ENCOUNTER — Other Ambulatory Visit: Payer: Self-pay

## 2021-10-14 ENCOUNTER — Ambulatory Visit: Payer: Medicare HMO | Admitting: Internal Medicine

## 2021-10-14 ENCOUNTER — Encounter: Payer: Self-pay | Admitting: Family Medicine

## 2021-10-14 VITALS — BP 146/60 | HR 83 | Ht 69.0 in | Wt 180.2 lb

## 2021-10-14 VITALS — BP 162/72 | HR 76 | Temp 97.9°F | Resp 18 | Ht 69.0 in | Wt 180.0 lb

## 2021-10-14 DIAGNOSIS — I255 Ischemic cardiomyopathy: Secondary | ICD-10-CM | POA: Diagnosis not present

## 2021-10-14 DIAGNOSIS — I6523 Occlusion and stenosis of bilateral carotid arteries: Secondary | ICD-10-CM | POA: Diagnosis not present

## 2021-10-14 DIAGNOSIS — I509 Heart failure, unspecified: Secondary | ICD-10-CM | POA: Diagnosis not present

## 2021-10-14 DIAGNOSIS — I779 Disorder of arteries and arterioles, unspecified: Secondary | ICD-10-CM

## 2021-10-14 DIAGNOSIS — E785 Hyperlipidemia, unspecified: Secondary | ICD-10-CM

## 2021-10-14 DIAGNOSIS — I10 Essential (primary) hypertension: Secondary | ICD-10-CM | POA: Diagnosis not present

## 2021-10-14 DIAGNOSIS — I2510 Atherosclerotic heart disease of native coronary artery without angina pectoris: Secondary | ICD-10-CM | POA: Diagnosis not present

## 2021-10-14 DIAGNOSIS — I429 Cardiomyopathy, unspecified: Secondary | ICD-10-CM | POA: Diagnosis not present

## 2021-10-14 MED ORDER — EMPAGLIFLOZIN 10 MG PO TABS: 10.0000 mg | ORAL_TABLET | Freq: Every day | ORAL | 11 refills | Status: DC

## 2021-10-14 MED ORDER — CARVEDILOL 25 MG PO TABS: 25.0000 mg | ORAL_TABLET | Freq: Two times a day (BID) | ORAL | 3 refills | Status: DC

## 2021-10-14 NOTE — Patient Instructions (Signed)
Medication Instructions:  ?INCREASE coreg to 25 mg by mouth TWICE daily.  ?START jardiance 10 mg by mouth daily.  ?*If you need a refill on your cardiac medications before your next appointment, please call your pharmacy* ? ? ?Lab Work: ?None today ?If you have labs (blood work) drawn today and your tests are completely normal, you will receive your results only by: ?MyChart Message (if you have MyChart) OR ?A paper copy in the mail ?If you have any lab test that is abnormal or we need to change your treatment, we will call you to review the results. ? ? ?Testing/Procedures: ?Your physician has requested that you have an echocardiogram. Echocardiography is a painless test that uses sound waves to create images of your heart. It provides your doctor with information about the size and shape of your heart and how well your heart?s chambers and valves are working. This procedure takes approximately one hour. There are no restrictions for this procedure. ? ? ? ?Follow-Up: ?At [facility], you and your health needs are our priority.  As part of our continuing mission to provide you with exceptional heart care, we have created designated Provider Care Teams.  These Care Teams include your primary Cardiologist (physician) and Advanced Practice Providers (APPs -  Physician Assistants and Nurse Practitioners) who all work together to provide you with the care you need, when you need it. ? ?We recommend signing up for the patient portal called "MyChart".  Sign up information is provided on this After Visit Summary.  MyChart is used to connect with patients for Virtual Visits (Telemedicine).  Patients are able to view lab/test results, encounter notes, upcoming appointments, etc.  Non-urgent messages can be sent to your provider as well.   ?To learn more about what you can do with MyChart, go to [email or web address].   ? ?Your next appointment:   ?3 month(s) ? ?The format for your next appointment:   ?In  Person ? ?Provider:   ?Lenna Sciara ? ? ? ?

## 2021-10-14 NOTE — Patient Instructions (Addendum)
Visit Information   Goals Addressed             This Visit's Progress    Track and Manage My Blood Pressure-Hypertension   On track    Timeframe:  Long-Range Goal Priority:  High Start Date:   07/15/21                          Expected End Date:  01/13/22                     Follow Up Date 10/13/21    - check blood pressure daily - choose a place to take my blood pressure (home, clinic or office, retail store) - write blood pressure results in a log or diary    Why is this important?   You won't feel high blood pressure, but it can still hurt your blood vessels.  High blood pressure can cause heart or kidney problems. It can also cause a stroke.  Making lifestyle changes like losing a little weight or eating less salt will help.  Checking your blood pressure at home and at different times of the day can help to control blood pressure.  If the doctor prescribes medicine remember to take it the way the doctor ordered.  Call the office if you cannot afford the medicine or if there are questions about it.     Notes: CMA to call in 2 weeks       Patient Care Plan: General Pharmacy (Adult)     Problem Identified: CAD, HTN, HX of TIA, CHF, COPD, Allergic Rhinitis, HLD, Anxiety   Priority: High  Onset Date: 07/15/2021     Long-Range Goal: Patient-Specific Goal   Start Date: 07/15/2021  Expected End Date: 01/13/2022  Recent Progress: On track  Priority: High  Note:   Current Barriers:  Unable to independently afford treatment regimen Unable to achieve control of BP  Suboptimal therapeutic regimen for COPD  Pharmacist Clinical Goal(s):  Patient will verbalize ability to afford treatment regimen achieve control of BP as evidenced by home monitoring adhere to plan to optimize therapeutic regimen for COPD as evidenced by report of adherence to recommended medication management changes through collaboration with PharmD and provider.   Interventions: 1:1 collaboration with  [name], MD regarding development and update of comprehensive plan of care as evidenced by provider attestation and co-signature Inter-disciplinary care team collaboration (see longitudinal plan of care) Comprehensive medication review performed; medication list updated in electronic medical record  Hypertension (BP goal <130/80) -Uncontrolled -Current treatment: Carvedilol '25mg'$  BID -Medications previously tried: lisinopril, losartan, Toprol XL, propranolol, valsartan, verapamil  -Current home readings: 148/71 today, no other home readings provided - does have access to cuff whenever he needs -Current dietary habits: HIGH salt intake, eats [employer] for breakfast every morning, other meals are home cooked by his wife.  She cooks a lot of vegetables from garden and will add lean meats in sometimes.  Not much hamburger.  Does add salt to foods at home. -Current exercise habits: loves to walk around [employer], hunts regularly, active in garden during the summer months -Denies hypotensive/hypertensive symptoms - did have some dizziness a few weeks ago which has resolved. -Educated on BP goals and benefits of medications for prevention of heart attack, stroke and kidney damage; Daily salt intake goal < 2300 mg; Importance of home blood pressure monitoring; Symptoms of hypotension and importance of maintaining adequate hydration; -Counseled to monitor BP  at home daily, document, and provide log at future appointments -Counseled on diet and exercise extensively Recommended to continue current medication Recommended he monitor BP at home twice daily for 2 weeks.  Will have CMA call for BP check at that point and report to me. Adjust meds possibly at that time.  Hyperlipidemia: (LDL goal < 70) -Controlled -Current treatment: Atorvastatin '80mg'$  daily Appropriate, Effective, Safe, Accessible -Medications previously tried: none noted  -Current dietary patterns: see HTN -Current exercise habits:  see HTN -Educated on Cholesterol goals;  Benefits of statin for ASCVD risk reduction; Importance of limiting foods high in cholesterol; -Recommended to continue current medication Most recent LDL is at goal, reports adherence with meds.  Update 10/14/21 High risk - high intensity statin is appropriate LDL at goal < 70. Recommend recheck lipids at 1 year from previous No changes at this time reinforced adherence.  Heart Failure (Goal: manage symptoms and prevent exacerbations) -Controlled -Last ejection fraction: 40-45% -NYHA Class: II (slight limitation of activity) -AHA HF Stage: C (Heart disease and symptoms present) -Current treatment: Jardiance '10mg'$  daily Appropriate, Effective, Safe, Accessible Furosemide '20mg'$  every other day - he is only taking PRN Appropriate, Effective, Safe, Accessible Carvedilol '25mg'$  BID Appropriate, Effective, Safe, Accessible Entresto 24-26 mg BID Appropriate, Effective, Safe, Accessible -Medications previously tried: losartan  -Current home BP/HR readings: 148/71, pulse unknown -Current dietary habits: see HTN - high salt content -Current exercise habits: see HTN -Educated on Importance of weighing daily; if you gain more than 3 pounds in one day or 5 pounds in one week, contact providers Importance of blood pressure control He does report symptoms of SOB, denies any visible swelling.  Take his lasix prn based on how much he is urinating. -Recommended to continue current medication Assessed patient finances. They are having issues with copay on Entresto, brought in taxes today and we will complete application and fax in.  Would prefer he stays on this med if at all possible. Recommended he weigh himself daily to determine fluid status and to see if Lasix is needed. Will FU once application for Delene Loll is submitted.  Update 10/14/21 He has a visit with cardiologist this morning.  They scheduled an echocardiogram.  The plan was if EF was > 35% to stop  Entresto and switch to Losartan.  They went ahead and started patient on Jardiance '10mg'$  daily.  He will begin taking tomorrow. Discussed the cost barriers that may come up with this, will leave application for [facility] at front desk he can pick up when he comes for PCP appointment. He denies any swelling or SOB - besides when he over exerts himself. No changes at this time - will FU after Echo which is scheduled for the 22nd.   COPD (Goal: control symptoms and prevent exacerbations) -Uncontrolled -Current treatment  Albuterol HFA 29mg prn -Medications previously tried: Breztri - swelling of mouth and tongue  -Current COPD Classification:  B (high sx, <2 exacerbations/yr) -Pulmonary function testing:Pulmonary Functions Testing Results: No results found for: FEV1, FVC, FEV1FVC, TLC, DLCO -Exacerbations requiring treatment in last 6 months: none -Patient denies consistent use of maintenance inhaler -Frequency of rescue inhaler use: 3-4 times per day -Counseled on Benefits of consistent maintenance inhaler use When to use rescue inhaler Differences between maintenance and rescue inhalers Possibility that mouth swelling was ICS induced thrush. -Recommended to continue current medication Based on symptom level I suspect patient would benefit from maintenance therapy for his COPD. As cost is an issue we will look  at options with easier PAP programs.   Recommend Stiolto Respimat due to ease of delivery and application process.  Will discuss with PCP.   Patient Goals/Self-Care Activities Patient will:  - take medications as prescribed as evidenced by patient report and record review focus on medication adherence by pill box check blood pressure daily, document, and provide at future appointments weigh daily, and contact provider if weight gain of 3 lbs in one day or 5 lbs in one week  Follow Up Plan: The care management team will reach out to the patient again over the next 90 days.            The patient verbalized understanding of instructions, educational materials, and care plan provided today and declined offer to receive copy of patient instructions, educational materials, and care plan.  Telephone follow up appointment with pharmacy team member scheduled for: 6 months  [name], [place], PharmD, [place] Clinical Pharmacist Practitioner [place] [phone]

## 2021-10-14 NOTE — Progress Notes (Signed)
Cardiology Office Note:    Date:  10/14/2021   ID:  ALTA SHOBER, DOB April 09, 1946, MRN [id number]  PCP:  [name], MD   [facility] Providers Cardiologist:  Lenna Sciara, MD Referring MD: [name], MD   Chief Complaint/Reason for Referral: Establish general cardiovascular care  ASSESSMENT:    Cardiomyopathy, ischemic  Coronary artery disease involving native coronary artery of native heart without angina pectoris  Hyperlipidemia, unspecified hyperlipidemia type  Bilateral carotid artery disease, unspecified type ([place])  PLAN:    In order of problems listed above: 1.  We will obtain echocardiogram.  If ejection fraction is above 35% we will stop Entresto and start losartan.  We will start Jardiance now.  Increase Coreg to 25 mg twice a day continue Lasix and follow-up in 3 months or earlier if needed. 2.  Continue beta-blocker and Plavix monotherapy as well as as needed nitroglycerin. 3.  LDL recently was at goal.  Continue atorvastatin.   4.  Continue aggressive blood pressure control and cardiovascular risk factor modification.    Dispo:  Return in about 3 months (around 01/14/2022).     Medication Adjustments/Labs and Tests Ordered: Current medicines are reviewed at length with the patient today.  Concerns regarding medicines are outlined above.   Tests Ordered: No orders of the defined types were placed in this encounter.   Medication Changes: No orders of the defined types were placed in this encounter.   History of Present Illness:    FOCUSED PROBLEM LIST:   1.  Coronary artery disease status post CABG consisting of a LIMA to LAD, vein graft to obtuse marginal 1 and 2, and vein graft to RCA in 2010; the vein graft to RCA is known occluded on cardiac catheterization in 2015 2.  Ischemic cardiomyopathy 3.  VT status post ICD followed by EP 4.  Hypertension 5.  Hyperlipidemia 6.  Peripheral vascular disease status post right carotid  enterectomy with known occluded left internal carotid artery 7.  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  Emphysema   The patient is a 76 y.o. male with the indicated medical history here to establish general cardiovascular care.  He was seen by the EP division recently was doing fairly well with no complaints of angina or shortness of breath.  Today he confirms the same.  He is able to do most of what he needs to do in a day without any exertional angina.  He occasionally gets short of breath.  He denies any peripheral edema or paroxysmal nocturnal dyspnea.  His weight has been relatively stable.  He has not had no ICD shocks recently.  He denies any severe bleeding or bruising.  He is required no hospitalizations or emergency room visits for shortness of breath or chest pain.        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 difficile colitis 06/16/2014   CAD (coronary artery disease)    a. s/p 3v CABG 2010. b. NSTEMI 05/2014 in setting of VT. Cath impressions: "Recent IMI with occluded SVG to RCA. Has Right to Right collaterals and collaterals from septal and OM. EF 40%."   Carotid disease, bilateral ([place])    a. Right CEA 2002; known occluded left carotid. b. Duplex 06/2014: known occluded LICA, stable 1-15% RICA s/p  CEA.   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 Prior EF 36%. b. 2014: 50%. c. 05/2014: EF 35-40% by echo, 40% with inf HK by cath.   NSTEMI (non-ST elevation myocardial infarction) ([place]) 05/2014   Pleural effusion 05/2014   Pneumonia 05/23/2014   Presence of permanent cardiac pacemaker    Prostate hypertrophy    PVC's  (premature ventricular contractions)    a. PVC's with prior Holter showing PVC load of 22%.   Shortness of breath dyspnea    Stroke ([place]) 01   TIA (transient ischemic attack)    ASPVD, S./P. right CEA, 2002, and known occluded left carotid   Ventricular tachycardia    a. Admitted with VT 05/2013 - EPS with inducible sustained monomorphic VT; s/p Medtronic ICD 05/21/14.     Current Medications: Current Meds  Medication Sig   albuterol (VENTOLIN HFA) 108 (90 Base) MCG/ACT inhaler INHALE 2 PUFFS BY MOUTH EVERY 6 HOURS AS NEEDED FOR WHEEZING FOR SHORTNESS OF BREATH   atorvastatin (LIPITOR) 80 MG tablet TAKE 1 TABLET BY MOUTH ONCE DAILY AT  6  PM   carvedilol (COREG) 12.5 MG tablet Take 1 tablet by mouth twice daily   citalopram (CELEXA) 20 MG tablet Take 20 mg by mouth daily.   clopidogrel (PLAVIX) 75 MG tablet Take 1 tablet by mouth once daily   diphenoxylate-atropine (LOMOTIL) 2.5-0.025 MG tablet Take 1 tablet by mouth 4 (four) times daily as needed for diarrhea or loose stools.   furosemide (LASIX) 20 MG tablet Take 1 tablet (20 mg total) by mouth as needed for fluid or edema.   meclizine (ANTIVERT) 25 MG tablet Take 1 tablet (25 mg total) by mouth 3 (three) times daily as needed for dizziness.   mexiletine (MEXITIL) 200 MG capsule Take 1 capsule by mouth twice daily   nilotinib (TASIGNA) 150 MG capsule TAKE 2 CAPSULES (300 MG TOTAL) BY MOUTH EVERY 12 (TWELVE) HOURS. TAKE ON AN EMPTY STOMACH, 1 HR BEFORE OR 2 HRS AFTER FOOD.   nitroGLYCERIN (NITROSTAT) 0.4 MG SL tablet Place 1 tablet (0.4 mg total) under the tongue every 5 (five) minutes as needed for chest pain.   ondansetron (ZOFRAN) 4 MG tablet Take 1 tablet (4 mg total) by mouth every 8 (eight) hours as needed for nausea or vomiting.   sacubitril-valsartan (ENTRESTO) 24-26 MG Take 1 tablet by mouth 2 (two) times daily.   sildenafil (VIAGRA) 100 MG tablet Take 0.5-1 tablets (50-100 mg total) by mouth daily as needed for erectile  dysfunction.     Allergies:    Breztri aerosphere [budeson-glycopyrrol-formoterol], Norvasc [amlodipine besylate], and Sulfa antibiotics   Social History:   Social History   Tobacco Use   Smoking status: Former    Packs/day: 3.00    Years: 45.00    Pack years: 135.00    Types: Cigarettes    Quit date: 08/08/1997    Years since quitting: 24.2    Passive exposure: Past   Smokeless tobacco: Former  Scientific laboratory technician Use: Never used  Substance Use Topics   Alcohol use: No    Alcohol/week: 0.0 standard drinks   Drug use: No     Family Hx: Family History  Problem Relation Age of Onset   Heart disease Mother    Alcohol  abuse Father      Review of Systems:   Please see the history of present illness.    All other systems reviewed and are negative.     EKGs/Labs/Other Test Reviewed:    EKG: Sinus rhythm with IVCD  Prior CV studies: 05/28/2020: TTE IMPRESSIONS   1. Left ventricular ejection fraction, by estimation, is 40 to 45%. The  left ventricle has mildly decreased function. The left ventricle  demonstrates global hypokinesis. There is moderate left ventricular  hypertrophy. Left ventricular diastolic  parameters are indeterminate.   2. Right ventricular systolic function is normal. The right ventricular  size is normal. There is normal pulmonary artery systolic pressure.   3. Left atrial size was severely dilated.   4. The mitral valve is normal in structure. Mild mitral valve  regurgitation. No evidence of mitral stenosis.   5. The aortic valve is tricuspid. Aortic valve regurgitation is mild. No  aortic stenosis is present.   6. The inferior vena cava is normal in size with greater than 50%  respiratory variability, suggesting right atrial pressure of 3 mmHg.      04/13/17: lexiscan stress test Nuclear stress EF: 35%. Inferior/inferoseptal defect consistent with scar No significant ischemia Intermediate risk study   06/07/16: TTE Study Conclusions -  Left ventricle: Global longitudinal LV strain is abnormal at -12%   The cavity size was normal. There was moderate focal basal and   mild concentric hypertrophy. Systolic function was mildly   reduced. The estimated ejection fraction was in the range of 45%   to 50%. There is akinesis of the basal-midinferior myocardium. - Aortic valve: Mildly to moderately calcified annulus. Trileaflet;   mildly thickened, mildly calcified leaflets. - Aorta: Aortic root dimension: 38 mm (ED). - Aortic root: The aortic root was mildly dilated. - Mitral valve: There was trivial regurgitation. - Left atrium: The atrium was mildly to moderately dilated. - Right ventricle: The cavity size was mildly dilated. Wall   thickness was normal. - Tricuspid valve: There was trivial regurgitation.     11/14/16: Carotid US IMPRESSION: 1. Surgical changes of prior right internal carotid endarterectomy with mild residual heterogeneous atherosclerotic plaque. By peak systolic velocity criteria, the estimated stenosis falls in the 1 - 49% range. 2. 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 3. The vertebral arteries are patent with normal antegrade flow bilaterally.  Imaging studies that I have independently reviewed today: Echocardiogram  Recent Labs: 08/25/2021: ALT 8; Hemoglobin 10.6; Platelets 310 09/27/2021: BUN 10; Creatinine, Ser 0.76; Magnesium 1.9; Potassium 4.2; Sodium 144   Recent Lipid Panel Lab Results  Component Value Date/Time   CHOL 116 02/18/2021 08:35 AM   CHOL 102 03/15/2017 11:29 AM   TRIG 90 02/18/2021 08:35 AM   HDL 36 (L) 02/18/2021 08:35 AM   HDL 31 (L) 03/15/2017 11:29 AM   LDLCALC 63 02/18/2021 08:35 AM    Risk Assessment/Calculations:          Physical Exam:    VS:  BP (!) 146/60    Pulse 83    Ht '5\' 9"'$  (1.753 m)    Wt 180 lb 3.2 oz (81.7 kg)    SpO2 98%    BMI 26.61 kg/m    Wt  Readings from Last 3 Encounters:  10/14/21 180 lb 3.2 oz (81.7 kg)  09/27/21 175 lb (79.4 kg)  09/14/21 176 lb (79.8 kg)    GENERAL:  No apparent distress, AOx3  HEENT: Well-healed carotid endarterectomy scar on his right side CAR: RRR no murmurs, gallops, rubs, or thrills RES:  Clear to auscultation bilaterally ABD:  Soft, nontender, nondistended, positive bowel sounds x 4 VASC:  +2 radial pulses, +2 carotid pulses, palpable pedal pulses NEURO:  CN 2-12 grossly intact; motor and sensory grossly intact PSYCH:  No active depression or anxiety EXT:  No edema, ecchymosis, or cyanosis  Signed, Early Osmond, MD  10/14/2021 10:00 AM    [address], [place], [place]  [address] Phone: [phone]; Fax: [phone]   Note:  This document was prepared using Dragon voice recognition software and may include unintentional dictation errors.

## 2021-10-14 NOTE — Progress Notes (Signed)
Subjective:    Patient ID: Anthony Wolfe, male    DOB: 06-Jun-1946, 76 y.o.   MRN: [id number] Patient is a very pleasant 76 year old Caucasian gentleman with a history of coronary artery disease and ischemic cardiomyopathy.  He saw his cardiologist today and I have recommended increasing his carvedilol due to his elevated blood pressure.  Is elevated here today at 162/72.  I have also recommended doing an echocardiogram and if his ejection fraction is improved I plan to take him off Entresto and replace it with Jardiance.  They have yet to schedule him for the echocardiogram.  He also has a history of carotid artery stenosis.  He is due to repeat carotid Dopplers. 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 difficile colitis 06/16/2014   CAD (coronary artery disease)    a. s/p 3v CABG 2010. b. NSTEMI 05/2014 in setting of VT. Cath impressions: "Recent IMI with occluded SVG to RCA. Has Right to Right collaterals and collaterals from septal and OM. EF 40%."   Carotid disease, bilateral ([place])    a. Right CEA 2002; known occluded left carotid. b. Duplex 06/2014: known occluded LICA, stable 2-67% RICA s/p CEA.   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 Prior EF 36%. b. 2014: 50%. c. 05/2014: EF 35-40% by echo, 40% with inf HK by cath.   NSTEMI (non-ST elevation myocardial infarction) ([place]) 05/2014   Pleural effusion 05/2014   Pneumonia 05/23/2014   Presence of permanent cardiac pacemaker    Prostate hypertrophy    PVC's (premature ventricular contractions)    a. PVC's with prior Holter showing PVC load of 22%.   Shortness of breath dyspnea    Stroke ([place])  01   TIA (transient ischemic attack)    ASPVD, S./P. right CEA, 2002, and known occluded left carotid   Ventricular tachycardia    a. Admitted with VT 05/2013 - EPS with inducible sustained monomorphic VT; s/p Medtronic ICD 05/21/14.   Past Surgical History:  Procedure Laterality Date   BACK SURGERY     CAROTID ENDARTERECTOMY Right 01   carotidectomy  2003   right side   COLONOSCOPY  07/2010   CORONARY ARTERY BYPASS GRAFT  02/2009   3 vessel   ELECTROPHYSIOLOGIC STUDY  05/21/14   ELECTROPHYSIOLOGY STUDY N/A 05/21/2014   Procedure: ELECTROPHYSIOLOGY STUDY;  Surgeon: Deboraha Sprang, MD;  Location: [facility] CATH LAB;  Service: Cardiovascular;  Laterality: N/A;   EP IMPLANTABLE DEVICE  05/21/14   single chamber Metronic ICD   LEFT HEART CATHETERIZATION WITH CORONARY/GRAFT ANGIOGRAM N/A 05/19/2014   Procedure: LEFT HEART CATHETERIZATION WITH Beatrix Fetters;  Surgeon: Josue Hector, MD;  Location: [facility] CATH LAB;  Service: Cardiovascular;  Laterality: N/A;   PLEURAL BIOPSY N/A 11/27/2014   Procedure: PLEURAL BIOPSY;  Surgeon: [name], MD;  Location: [place];  Service: Thoracic;  Laterality: N/A;   PLEURAL EFFUSION DRAINAGE Right 11/27/2014   Procedure: DRAINAGE OF PLEURAL EFFUSION;  Surgeon: [name], MD;  Location: [place];  Service: Thoracic;  Laterality: Right;   TALC PLEURODESIS Right 11/27/2014   Procedure:  TALC PLEURADESIS;  Surgeon: [name], MD;  Location: [place];  Service: Thoracic;  Laterality: Right;   VIDEO ASSISTED THORACOSCOPY Right 11/27/2014   Procedure: RIGHT VIDEO ASSISTED THORACOSCOPY;  Surgeon: [name], MD;  Location: [place];  Service: Thoracic;  Laterality: Right;   Current Outpatient Medications on File Prior to Visit  Medication Sig Dispense Refill   albuterol (VENTOLIN HFA) 108 (90 Base) MCG/ACT inhaler INHALE 2 PUFFS BY MOUTH EVERY 6 HOURS AS NEEDED FOR WHEEZING FOR SHORTNESS OF BREATH 18 g 11   atorvastatin (LIPITOR) 80 MG  tablet TAKE 1 TABLET BY MOUTH ONCE DAILY AT  6  PM 90 tablet 3   carvedilol (COREG) 25 MG tablet Take 1 tablet (25 mg total) by mouth 2 (two) times daily. 180 tablet 3   citalopram (CELEXA) 20 MG tablet Take 20 mg by mouth daily.     clopidogrel (PLAVIX) 75 MG tablet Take 1 tablet by mouth once daily 90 tablet 3   diphenoxylate-atropine (LOMOTIL) 2.5-0.025 MG tablet Take 1 tablet by mouth 4 (four) times daily as needed for diarrhea or loose stools. 30 tablet 0   empagliflozin (JARDIANCE) 10 MG TABS tablet Take 1 tablet (10 mg total) by mouth daily before breakfast. 30 tablet 11   furosemide (LASIX) 20 MG tablet Take 1 tablet (20 mg total) by mouth as needed for fluid or edema. 90 tablet 1   meclizine (ANTIVERT) 25 MG tablet Take 1 tablet (25 mg total) by mouth 3 (three) times daily as needed for dizziness. 30 tablet 0   mexiletine (MEXITIL) 200 MG capsule Take 1 capsule by mouth twice daily 60 capsule 3   nilotinib (TASIGNA) 150 MG capsule TAKE 2 CAPSULES (300 MG TOTAL) BY MOUTH EVERY 12 (TWELVE) HOURS. TAKE ON AN EMPTY STOMACH, 1 HR BEFORE OR 2 HRS AFTER FOOD. 112 capsule 12   nitroGLYCERIN (NITROSTAT) 0.4 MG SL tablet Place 1 tablet (0.4 mg total) under the tongue every 5 (five) minutes as needed for chest pain. 25 tablet 4   ondansetron (ZOFRAN) 4 MG tablet Take 1 tablet (4 mg total) by mouth every 8 (eight) hours as needed for nausea or vomiting. 30 tablet 3   sacubitril-valsartan (ENTRESTO) 24-26 MG Take 1 tablet by mouth 2 (two) times daily. 180 tablet 3   sildenafil (VIAGRA) 100 MG tablet Take 0.5-1 tablets (50-100 mg total) by mouth daily as needed for erectile dysfunction. 5 tablet 11   No current facility-administered medications on file prior to visit.   Allergies  Allergen Reactions   Breztri Aerosphere [Budeson-Glycopyrrol-Formoterol] Swelling    Swelling of mouth tongue    Norvasc [Amlodipine Besylate] Rash    rash   Sulfa Antibiotics Hives    Hives    Social History    Socioeconomic History   Marital status: Married    Spouse name: Not on file   Number of children: 2   Years of education: Not on file   Highest education level: Not on file  Occupational History    Comment: retired Development worker, international aid  Tobacco Use   Smoking status: Former    Packs/day: 3.00    Years: 45.00    Pack years: 135.00    Types: Cigarettes    Quit date: 08/08/1997    Years since quitting: 24.2    Passive exposure: Past   Smokeless tobacco: Former  Scientific laboratory technician Use: Never used  Substance and Sexual Activity   Alcohol use: No    Alcohol/week: 0.0 standard  drinks   Drug use: No   Sexual activity: Yes  Other Topics Concern   Not on file  Social History Narrative   Not on file   Social Determinants of Health   Financial Resource Strain: Low Risk    Difficulty of Paying Living Expenses: Not very hard  Food Insecurity: No Food Insecurity   Worried About Running Out of Food in the Last Year: Never true   Ran Out of Food in the Last Year: Never true  Transportation Needs: No Transportation Needs   Lack of Transportation (Medical): No   Lack of Transportation (Non-Medical): No  Physical Activity: Sufficiently Active   Days of Exercise per Week: 5 days   Minutes of Exercise per Session: 30 min  Stress: No Stress Concern Present   Feeling of Stress : Only a little  Social Connections: Moderately Integrated   Frequency of Communication with Friends and Family: More than three times a week   Frequency of Social Gatherings with Friends and Family: Twice a week   Attends Religious Services: More than 4 times per year   Active Member of [employer] or Organizations: No   Attends Music therapist: Not on file   Marital Status: Married  Human resources officer Violence: Not At Risk   Fear of Current or Ex-Partner: No   Emotionally Abused: No   Physically Abused: No   Sexually Abused: No      Review of Systems  Gastrointestinal:  Positive for diarrhea.  All other  systems reviewed and are negative.     Objective:   Physical Exam Vitals reviewed.  Constitutional:      General: He is not in acute distress.    Appearance: Normal appearance. He is well-developed. He is not ill-appearing, toxic-appearing or diaphoretic.  Cardiovascular:     Rate and Rhythm: Normal rate and regular rhythm.     Heart sounds: Normal heart sounds. No murmur heard. Pulmonary:     Effort: Pulmonary effort is normal. No tachypnea, accessory muscle usage, prolonged expiration or respiratory distress.     Breath sounds: Rales present. No wheezing.  Abdominal:     General: Bowel sounds are normal. There is no distension.     Palpations: Abdomen is soft. There is no mass.     Tenderness: There is no abdominal tenderness. There is no guarding or rebound.  Musculoskeletal:     Right lower leg: No edema.     Left lower leg: No edema.  Neurological:     Mental Status: He is alert.          Assessment & Plan:  Bilateral carotid artery stenosis - Plan: US Carotid Duplex Bilateral  Congestive heart failure with cardiomyopathy ([place])  Benign essential HTN I will schedule the patient for carotid Dopplers.  Await the results of his echocardiogram.  The patient has done very well on Entresto.  I explained to the patient that he is to increase carvedilol due to his elevated blood pressure 25 mg twice a day.  We will get the echocardiogram await cardiology's recommendations.

## 2021-10-21 ENCOUNTER — Ambulatory Visit
Admission: RE | Admit: 2021-10-21 | Discharge: 2021-10-21 | Disposition: A | Discharge status: Home / Self Care | Payer: Medicare HMO | Source: Ambulatory Visit | Attending: Family Medicine | Admitting: Family Medicine

## 2021-10-21 DIAGNOSIS — I10 Essential (primary) hypertension: Secondary | ICD-10-CM | POA: Diagnosis not present

## 2021-10-21 DIAGNOSIS — E785 Hyperlipidemia, unspecified: Secondary | ICD-10-CM | POA: Diagnosis not present

## 2021-10-21 DIAGNOSIS — Z951 Presence of aortocoronary bypass graft: Secondary | ICD-10-CM | POA: Diagnosis not present

## 2021-10-21 DIAGNOSIS — I6523 Occlusion and stenosis of bilateral carotid arteries: Secondary | ICD-10-CM | POA: Diagnosis not present

## 2021-10-27 ENCOUNTER — Ambulatory Visit (HOSPITAL_COMMUNITY): Payer: Medicare HMO | Attending: Internal Medicine

## 2021-10-27 ENCOUNTER — Other Ambulatory Visit: Payer: Self-pay

## 2021-10-27 DIAGNOSIS — I2510 Atherosclerotic heart disease of native coronary artery without angina pectoris: Secondary | ICD-10-CM | POA: Diagnosis not present

## 2021-10-27 DIAGNOSIS — I255 Ischemic cardiomyopathy: Secondary | ICD-10-CM | POA: Insufficient documentation

## 2021-10-27 DIAGNOSIS — I779 Disorder of arteries and arterioles, unspecified: Secondary | ICD-10-CM | POA: Diagnosis not present

## 2021-10-27 DIAGNOSIS — E785 Hyperlipidemia, unspecified: Secondary | ICD-10-CM | POA: Insufficient documentation

## 2021-10-27 LAB — ECHOCARDIOGRAM COMPLETE
Area-P 1/2: 5.27 cm2
S' Lateral: 3.8 cm

## 2021-11-03 ENCOUNTER — Telehealth: Payer: Self-pay | Admitting: *Deleted

## 2021-11-03 MED ORDER — LOSARTAN POTASSIUM 25 MG PO TABS: 25.0000 mg | ORAL_TABLET | Freq: Every day | ORAL | 3 refills | Status: DC

## 2021-11-03 NOTE — Telephone Encounter (Signed)
Patient and his wife voice understanding to stop Entresto and start losartan 25 mg daily at bedtime.   ?

## 2021-11-03 NOTE — Telephone Encounter (Signed)
-----   Message from Early Osmond, MD sent at 10/27/2021 12:19 PM EDT ----- ?EF about 40-45%, so does not need Entresto--let's stop it and start losartan '25mg'$  qpm.  ?

## 2021-11-05 DIAGNOSIS — I509 Heart failure, unspecified: Secondary | ICD-10-CM | POA: Diagnosis not present

## 2021-11-05 DIAGNOSIS — E785 Hyperlipidemia, unspecified: Secondary | ICD-10-CM

## 2021-11-05 DIAGNOSIS — I429 Cardiomyopathy, unspecified: Secondary | ICD-10-CM | POA: Diagnosis not present

## 2021-11-11 ENCOUNTER — Ambulatory Visit (INDEPENDENT_AMBULATORY_CARE_PROVIDER_SITE_OTHER): Payer: Medicare HMO

## 2021-11-11 VITALS — Ht 69.0 in | Wt 180.0 lb

## 2021-11-11 DIAGNOSIS — Z0000 Encounter for general adult medical examination without abnormal findings: Secondary | ICD-10-CM | POA: Diagnosis not present

## 2021-11-11 NOTE — Progress Notes (Signed)
? ?Subjective:  ? Anthony Wolfe is a 76 y.o. male who presents for Medicare Annual/Subsequent preventive examination. ?Virtual Visit via Telephone Note ? ?I connected with  [name] on 11/11/21 at 10:45 AM EDT by telephone and verified that I am speaking with the correct person using two identifiers. ? ?Location: ?Patient: HOME ?Provider: [facility] ?Persons participating in the virtual visit: patient/Nurse Health Advisor ?  ?I discussed the limitations, risks, security and privacy concerns of performing an evaluation and management service by telephone and the availability of in person appointments. The patient expressed understanding and agreed to proceed. ? ?Interactive audio and video telecommunications were attempted between this nurse and patient, however failed, due to patient having technical difficulties OR patient did not have access to video capability.  We continued and completed visit with audio only. ? ?Some vital signs may be absent or patient reported.  ? ?Chriss Driver, LPN ? ?Review of Systems    ? ?Cardiac Risk Factors include: advanced age (>56mn, >>36women);dyslipidemia;hypertension;male gender;sedentary lifestyle ? ?   ?Objective:  ?  ?Today's Vitals  ? 11/11/21 1047  ?Weight: 180 lb (81.6 kg)  ?Height: '5\' 9"'$  (1.753 m)  ? ?Body mass index is 26.58 kg/m?. ? ? ?  11/11/2021  ? 10:53 AM 08/10/2021  ? 10:47 AM 08/06/2021  ? 12:43 PM 03/26/2020  ?  9:29 AM 06/21/2016  ?  8:35 AM 02/16/2016  ?  8:02 AM 11/17/2015  ?  8:35 AM  ?Advanced Directives  ?Does Patient Have a Medical Advance Directive? Yes Yes Yes Yes Yes Yes Yes  ?Type of AParamed[place]of [place] will Healthcare Power of [place]onLiving will Living will;Healthcare Power of ASkyline AcresLiving will H[place]tonLiving will HHallsLiving will  ?Does patient want to make changes to medical advance directive?    No - Patient  declined No - Patient declined No - Patient declined No - Patient declined  ?Copy of [place] Chart? Yes - validated most recent copy scanned in chart (See row information) No - copy requested No - copy requested  No - copy requested No - copy requested No - copy requested  ? ? ?Current Medications (verified) ?Outpatient Encounter Medications as of 11/11/2021  ?Medication Sig  ? albuterol (VENTOLIN HFA) 108 (90 Base) MCG/ACT inhaler INHALE 2 PUFFS BY MOUTH EVERY 6 HOURS AS NEEDED FOR WHEEZING FOR SHORTNESS OF BREATH  ? atorvastatin (LIPITOR) 80 MG tablet TAKE 1 TABLET BY MOUTH ONCE DAILY AT  6  PM  ? carvedilol (COREG) 25 MG tablet Take 1 tablet (25 mg total) by mouth 2 (two) times daily.  ? citalopram (CELEXA) 20 MG tablet Take 20 mg by mouth daily.  ? clopidogrel (PLAVIX) 75 MG tablet Take 1 tablet by mouth once daily  ? diphenoxylate-atropine (LOMOTIL) 2.5-0.025 MG tablet Take 1 tablet by mouth 4 (four) times daily as needed for diarrhea or loose stools.  ? empagliflozin (JARDIANCE) 10 MG TABS tablet Take 1 tablet (10 mg total) by mouth daily before breakfast.  ? furosemide (LASIX) 20 MG tablet Take 1 tablet (20 mg total) by mouth as needed for fluid or edema.  ? losartan (COZAAR) 25 MG tablet Take 1 tablet (25 mg total) by mouth daily.  ? meclizine (ANTIVERT) 25 MG tablet Take 1 tablet (25 mg total) by mouth 3 (three) times daily as needed for dizziness.  ? mexiletine (MEXITIL) 200 MG capsule  Take 1 capsule by mouth twice daily  ? nilotinib (TASIGNA) 150 MG capsule TAKE 2 CAPSULES (300 MG TOTAL) BY MOUTH EVERY 12 (TWELVE) HOURS. TAKE ON AN EMPTY STOMACH, 1 HR BEFORE OR 2 HRS AFTER FOOD.  ? nitroGLYCERIN (NITROSTAT) 0.4 MG SL tablet Place 1 tablet (0.4 mg total) under the tongue every 5 (five) minutes as needed for chest pain.  ? ondansetron (ZOFRAN) 4 MG tablet Take 1 tablet (4 mg total) by mouth every 8 (eight) hours as needed for nausea or vomiting.  ? sildenafil (VIAGRA) 100 MG tablet  Take 0.5-1 tablets (50-100 mg total) by mouth daily as needed for erectile dysfunction.  ? ?No facility-administered encounter medications on file as of 11/11/2021.  ? ? ?Allergies (verified) ?Breztri aerosphere [budeson-glycopyrrol-formoterol], Norvasc [amlodipine besylate], and Sulfa antibiotics  ? ?History: ?Past Medical History:  ?Diagnosis Date  ? AICD (automatic cardioverter/defibrillator) present   ? Allergic rhinitis   ? 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 Anxiety   ? Blood dyscrasia   ? cmleukemia  ? C. difficile colitis 06/16/2014  ? CAD (coronary artery disease)   ? a. s/p 3v CABG 2010. b. NSTEMI 05/2014 in setting of VT. Cath impressions: "Recent IMI with occluded SVG to RCA. Has Right to Right collaterals and collaterals from septal and OM. EF 40%."  ? Carotid disease, bilateral ([place])   ? a. Right CEA 2002; known occluded left carotid. b. Duplex 06/2014: known occluded LICA, stable 0-99% RICA s/p CEA.  ? Chronic systolic CHF (congestive heart failure) ([place])   ? 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 Depression   ? Emphysema of lung ([place])   ? HTN (hypertension)   ? Hyperlipidemia   ? Ischemic cardiomyopathy   ? a. Prior EF 36%. b. 2014: 50%. c. 05/2014: EF 35-40% by echo, 40% with inf HK by cath.  ? NSTEMI (non-ST elevation myocardial infarction) ([place]) 05/2014  ? Pleural effusion 05/2014  ? Pneumonia 05/23/2014  ? Presence of permanent cardiac pacemaker   ? Prostate hypertrophy   ? PVC's (premature ventricular contractions)   ? a. PVC's with prior Holter showing PVC load of 22%.  ? Shortness of breath dyspnea   ? Stroke [facility]) 01  ? TIA (transient ischemic attack)   ? ASPVD, S./P. right CEA, 2002, and known occluded left carotid  ? Ventricular tachycardia ([place])   ? a. Admitted with VT 05/2013 - EPS with inducible sustained monomorphic VT; s/p Medtronic ICD 05/21/14.  ? ?Past Surgical History:  ?Procedure Laterality Date  ? BACK SURGERY    ? CAROTID ENDARTERECTOMY Right 01  ? carotidectomy   2003  ? right side  ? COLONOSCOPY  07/2010  ? CORONARY ARTERY BYPASS GRAFT  02/2009  ? 3 vessel  ? ELECTROPHYSIOLOGIC STUDY  05/21/14  ? ELECTROPHYSIOLOGY STUDY N/A 05/21/2014  ? Procedure: ELECTROPHYSIOLOGY STUDY;  Surgeon: Deboraha Sprang, MD;  [facility] C[facility] LAB;  Service: Cardiovascular;  Laterality: N/A;  ? EP IMPLANTABLE DEVICE  05/21/14  ? single chamber Metronic ICD  ? LEFT HEART CATHETERIZATION WITH CORONARY/GRAFT ANGIOGRAM N/A 05/19/2014  ? Procedure: LEFT HEART CATHETERIZATION WITH Beatrix Fetters;  Surgeon: Josue Hector, MD;  Location: [facility] CATH LAB;  Service: Cardiovascular;  Laterality: N/A;  ? PLEURAL BIOPSY N/A 11/27/2014  ? Procedure: PLEURAL BIOPSY;  Surgeon: [name], MD;  Location: [place];  Service: Thoracic;  Laterality: N/A;  ? PLEURAL EFFUSION DRAINAGE Right 11/27/2014  ? Procedure: DRAINAGE OF PLEURAL EFFUSION;  Surgeon: [name],  MD;  Location: [facility] OR;  Service: Thoracic;  Laterality: Right;  ? TALC PLEURODESIS Right 11/27/2014  ? Procedure: Pietro Cassis;  Surgeon: [name], MD;  Location: [place];  Service: Thoracic;  Laterality: Right;  ? VIDEO ASSISTED THORACOSCOPY Right 11/27/2014  ? Procedure: RIGHT VIDEO ASSISTED THORACOSCOPY;  Surgeon: [name], MD;  Location: [place];  Service: Thoracic;  Laterality: Right;  ? ?Family History  ?Problem Relation Age of Onset  ? Heart disease Mother   ? Alcohol abuse Father   ? ?Social History  ? ?Socioeconomic History  ? Marital status: Married  ?  Spouse name: Not on file  ? Number of children: 2  ? Years of education: Not on file  ? Highest education level: Not on file  ?Occupational History  ?  Comment: retired Development worker, international aid  ?Tobacco Use  ? Smoking status: Former  ?  Packs/day: 3.00  ?  Years: 45.00  ?  Pack years: 135.00  ?  Types: Cigarettes  ?  Quit date: 08/08/1997  ?  Years since quitting: 24.2  ?  Passive exposure: Past  ? Smokeless tobacco: Former  ?Vaping Use  ? Vaping Use: Never used   ?Substance and Sexual Activity  ? Alcohol use: No  ?  Alcohol/week: 0.0 standard drinks  ? Drug use: No  ? Sexual activity: Yes  ?Other Topics Concern  ? Not on file  ?Social History Narrative  ? Not on file

## 2021-11-11 NOTE — Patient Instructions (Signed)
Mr. Bunch , ?Thank you for taking time to come for your Medicare Wellness Visit. I appreciate your ongoing commitment to your health goals. Please review the following plan we discussed and let me know if I can assist you in the future.  ? ?Screening recommendations/referrals: ?Colonoscopy: No longer required due to age.  ? ?Recommended yearly ophthalmology/optometry visit for glaucoma screening and checkup ?Recommended yearly dental visit for hygiene and checkup ? ?Vaccinations: ?Influenza vaccine: Discussed.  ?Pneumococcal vaccine: Done 01/29/2015 and 05/07/2014 ?Tdap vaccine:Due Repeat in 10 years ? ?Shingles vaccine: Discussed.    ?Covid-19: Declined.  ? ?Advanced directives: Copy in chart. ? ?Conditions/risks identified: Aim for 30 minutes of exercise or brisk walking, 6-8 glasses of water, and 5 servings of fruits and vegetables each day. ? ? ?Next appointment: Follow up in one year for your annual wellness visit. 2024. ? ?Preventive Care 28 Years and Older, Male ? ?Preventive care refers to lifestyle choices and visits with your health care provider that can promote health and wellness. ?What does preventive care include? ?A yearly physical exam. This is also called an annual well check. ?Dental exams once or twice a year. ?Routine eye exams. Ask your health care provider how often you should have your eyes checked. ?Personal lifestyle choices, including: ?Daily care of your teeth and gums. ?Regular physical activity. ?Eating a healthy diet. ?Avoiding tobacco and drug use. ?Limiting alcohol use. ?Practicing safe sex. ?Taking low doses of aspirin every day. ?Taking vitamin and mineral supplements as recommended by your health care provider. ?What happens during an annual well check? ?The services and screenings done by your health care provider during your annual well check will depend on your age, overall health, lifestyle risk factors, and family history of disease. ?Counseling  ?Your health care provider  may ask you questions about your: ?Alcohol use. ?Tobacco use. ?Drug use. ?Emotional well-being. ?Home and relationship well-being. ?Sexual activity. ?Eating habits. ?History of falls. ?Memory and ability to understand (cognition). ?Work and work Statistician. ?Screening  ?You may have the following tests or measurements: ?Height, weight, and BMI. ?Blood pressure. ?Lipid and cholesterol levels. These may be checked every 5 years, or more frequently if you are over 66 years old. ?Skin check. ?Lung cancer screening. You may have this screening every year starting at age 39 if you have a 30-pack-year history of smoking and currently smoke or have quit within the past 15 years. ?Fecal occult blood test (FOBT) of the stool. You may have this test every year starting at age 42. ?Flexible sigmoidoscopy or colonoscopy. You may have a sigmoidoscopy every 5 years or a colonoscopy every 10 years starting at age 23. ?Prostate cancer screening. Recommendations will vary depending on your family history and other risks. ?Hepatitis C blood test. ?Hepatitis B blood test. ?Sexually transmitted disease (STD) testing. ?Diabetes screening. This is done by checking your blood sugar (glucose) after you have not eaten for a while (fasting). You may have this done every 1-3 years. ?Abdominal aortic aneurysm (AAA) screening. You may need this if you are a current or former smoker. ?Osteoporosis. You may be screened starting at age 53 if you are at high risk. ?Talk with your health care provider about your test results, treatment options, and if necessary, the need for more tests. ?Vaccines  ?Your health care provider may recommend certain vaccines, such as: ?Influenza vaccine. This is recommended every year. ?Tetanus, diphtheria, and acellular pertussis (Tdap, Td) vaccine. You may need a Td booster every 10 years. ?Zoster vaccine.  You may need this after age 16. ?Pneumococcal 13-valent conjugate (PCV13) vaccine. One dose is recommended after  age 54. ?Pneumococcal polysaccharide (PPSV23) vaccine. One dose is recommended after age 28. ?Talk to your health care provider about which screenings and vaccines you need and how often you need them. ?This information is not intended to replace advice given to you by your health care provider. Make sure you discuss any questions you have with your health care provider. ?Document Released: 08/21/2015 Document Revised: 04/13/2016 Document Reviewed: 05/26/2015 ?Elsevier Interactive Patient Education ? 2017 [place]. ? ?Fall Prevention in the Home ?Falls can cause injuries. They can happen to people of all ages. There are many things you can do to make your home safe and to help prevent falls. ?What can I do on the outside of my home? ?Regularly fix the edges of walkways and driveways and fix any cracks. ?Remove anything that might make you trip as you walk through a door, such as a raised step or threshold. ?Trim any bushes or trees on the path to your home. ?Use bright outdoor lighting. ?Clear any walking paths of anything that might make someone trip, such as rocks or tools. ?Regularly check to see if handrails are loose or broken. Make sure that both sides of any steps have handrails. ?Any raised decks and porches should have guardrails on the edges. ?Have any leaves, snow, or ice cleared regularly. ?Use sand or salt on walking paths during winter. ?Clean up any spills in your garage right away. This includes oil or grease spills. ?What can I do in the bathroom? ?Use night lights. ?Install grab bars by the toilet and in the tub and shower. Do not use towel bars as grab bars. ?Use non-skid mats or decals in the tub or shower. ?If you need to sit down in the shower, use a plastic, non-slip stool. ?Keep the floor dry. Clean up any water that spills on the floor as soon as it happens. ?Remove soap buildup in the tub or shower regularly. ?Attach bath mats securely with double-sided non-slip rug tape. ?Do not have  throw rugs and other things on the floor that can make you trip. ?What can I do in the bedroom? ?Use night lights. ?Make sure that you have a light by your bed that is easy to reach. ?Do not use any sheets or blankets that are too big for your bed. They should not hang down onto the floor. ?Have a firm chair that has side arms. You can use this for support while you get dressed. ?Do not have throw rugs and other things on the floor that can make you trip. ?What can I do in the kitchen? ?Clean up any spills right away. ?Avoid walking on wet floors. ?Keep items that you use a lot in easy-to-reach places. ?If you need to reach something above you, use a strong step stool that has a grab bar. ?Keep electrical cords out of the way. ?Do not use floor polish or wax that makes floors slippery. If you must use wax, use non-skid floor wax. ?Do not have throw rugs and other things on the floor that can make you trip. ?What can I do with my stairs? ?Do not leave any items on the stairs. ?Make sure that there are handrails on both sides of the stairs and use them. Fix handrails that are broken or loose. Make sure that handrails are as long as the stairways. ?Check any carpeting to make sure that it is  firmly attached to the stairs. Fix any carpet that is loose or worn. ?Avoid having throw rugs at the top or bottom of the stairs. If you do have throw rugs, attach them to the floor with carpet tape. ?Make sure that you have a light switch at the top of the stairs and the bottom of the stairs. If you do not have them, ask someone to add them for you. ?What else can I do to help prevent falls? ?Wear shoes that: ?Do not have high heels. ?Have rubber bottoms. ?Are comfortable and fit you well. ?Are closed at the toe. Do not wear sandals. ?If you use a stepladder: ?Make sure that it is fully opened. Do not climb a closed stepladder. ?Make sure that both sides of the stepladder are locked into place. ?Ask someone to hold it for you, if  possible. ?Clearly mark and make sure that you can see: ?Any grab bars or handrails. ?First and last steps. ?Where the edge of each step is. ?Use tools that help you move around (mobility aids) if they a

## 2021-11-18 ENCOUNTER — Ambulatory Visit (INDEPENDENT_AMBULATORY_CARE_PROVIDER_SITE_OTHER): Payer: Medicare HMO | Admitting: Family Medicine

## 2021-11-18 VITALS — BP 150/90 | HR 98 | Temp 95.6°F | Ht 69.0 in | Wt 181.2 lb

## 2021-11-18 DIAGNOSIS — R0600 Dyspnea, unspecified: Secondary | ICD-10-CM | POA: Diagnosis not present

## 2021-11-18 DIAGNOSIS — Z95810 Presence of automatic (implantable) cardiac defibrillator: Secondary | ICD-10-CM

## 2021-11-18 DIAGNOSIS — I4891 Unspecified atrial fibrillation: Secondary | ICD-10-CM

## 2021-11-18 DIAGNOSIS — I509 Heart failure, unspecified: Secondary | ICD-10-CM | POA: Diagnosis not present

## 2021-11-18 DIAGNOSIS — I429 Cardiomyopathy, unspecified: Secondary | ICD-10-CM

## 2021-11-18 DIAGNOSIS — Z8679 Personal history of other diseases of the circulatory system: Secondary | ICD-10-CM | POA: Diagnosis not present

## 2021-11-18 NOTE — Progress Notes (Signed)
? ?Subjective:  ? ? Patient ID: Anthony Wolfe, male    DOB: March 15, 1946, 76 y.o.   MRN: [id number] ?Patient is a very pleasant 76 year old Caucasian gentleman with a history of coronary artery disease and ischemic cardiomyopathy and COPD.  Patient states that for the last 3 weeks he has been short of breath.  He attributes it to starting Jardiance.  He met with his cardiologist who discontinued Entresto and switched him to Jardiance, losartan, and increased his carvedilol.  Since that time, the patient states that he has been having shortness of breath and dyspnea on exertion.  He denies any chest pain.  He denies any pleurisy.  He denies any cough or wheezing. ?Wt Readings from Last 3 Encounters:  ?11/18/21 181 lb 3.2 oz (82.2 kg)  ?11/11/21 180 lb (81.6 kg)  ?10/14/21 180 lb (81.6 kg)  ? ?Weight is essentially the same.  He does not appear fluid overloaded today.  He has chronic right basilar crackles due to a chronic pleural effusion but otherwise his lungs sound clear.  There is no pitting edema in his extremities although his heart rate does seem somewhat irregular which is unusual given the fact he has a pacemaker.  He states that he feels like his heart races at times.  Patient discontinued losartan, because he states that it made him act crazy.  He is taking the higher dose of carvedilol that his cardiologist recommended. ? ?EKG today shows atrial fibrillation with an irregularly irregular heart rate. ?Past Medical History:  ?Diagnosis Date  ? AICD (automatic cardioverter/defibrillator) present   ? Allergic rhinitis   ? 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 Anxiety   ? Blood dyscrasia   ? cmleukemia  ? C. difficile colitis 06/16/2014  ? CAD (coronary artery disease)   ? a. s/p 3v CABG 2010. b. NSTEMI 05/2014 in setting of VT. Cath impressions: "Recent IMI with occluded SVG to RCA. Has Right to Right collaterals and collaterals from septal and OM. EF 40%."  ? Carotid disease, bilateral ([place])   ? a. Right  CEA 2002; known occluded left carotid. b. Duplex 06/2014: known occluded LICA, stable 5-18% RICA s/p CEA.  ? Chronic systolic CHF (congestive heart failure) ([place])   ? 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 Depression   ? Emphysema of lung ([place])   ? HTN (hypertension)   ? Hyperlipidemia   ? Ischemic cardiomyopathy   ? a. Prior EF 36%. b. 2014: 50%. c. 05/2014: EF 35-40% by echo, 40% with inf HK by cath.  ? NSTEMI (non-ST elevation myocardial infarction) ([place]) 05/2014  ? Pleural effusion 05/2014  ? Pneumonia 05/23/2014  ? Presence of permanent cardiac pacemaker   ? Prostate hypertrophy   ? PVC's (premature ventricular contractions)   ? a. PVC's with prior Holter showing PVC load of 22%.  ? Shortness of breath dyspnea   ? Stroke [facility]) 01  ? TIA (transient ischemic attack)   ? ASPVD, S./P. right CEA, 2002, and known occluded left carotid  ? Ventricular tachycardia ([place])   ? a. Admitted with VT 05/2013 - EPS with inducible sustained monomorphic VT; s/p [employer] ICD 05/21/14.  ? ?Past Surgical History:  ?Procedure Laterality Date  ? BACK SURGERY    ? CAROTID ENDARTERECTOMY Right 01  ? carotidectomy  2003  ? right side  ? COLONOSCOPY  07/2010  ? CORONARY ARTERY BYPASS GRAFT  02/2009  ? 3 vessel  ? ELECTROPHYSIOLOGIC STUDY  05/21/14  ? ELECTROPHYSIOLOGY STUDY N/A 05/21/2014  ?  Procedure: ELECTROPHYSIOLOGY STUDY;  Surgeon: Deboraha Sprang, MD;  Location[facility] C[facility] LAB;  Service: Cardiovascular;  Laterality: N/A;  ? EP IMPLANTABLE DEVICE  05/21/14  ? single chamber Metronic ICD  ? LEFT HEART CATHETERIZATION WITH CORONARY/GRAFT ANGIOGRAM N/A 05/19/2014  ? Procedure: LEFT HEART CATHETERIZATION WITH Beatrix Fetters;  Surgeon: Josue Hector, MD;  Location: [facility] CATH LAB;  Service: Cardiovascular;  Laterality: N/A;  ? PLEURAL BIOPSY N/A 11/27/2014  ? Procedure: PLEURAL BIOPSY;  Surgeon: [name], MD;  Location: [place];  Service: Thoracic;  Laterality: N/A;  ? PLEURAL EFFUSION DRAINAGE Right 11/27/2014   ? Procedure: DRAINAGE OF PLEURAL EFFUSION;  Surgeon: [name], MD;  Location: [place];  Service: Thoracic;  Laterality: Right;  ? TALC PLEURODESIS Right 11/27/2014  ? Procedure: Pietro Cassis;  Surgeon: [name], MD;  Location: [place];  Service: Thoracic;  Laterality: Right;  ? VIDEO ASSISTED THORACOSCOPY Right 11/27/2014  ? Procedure: RIGHT VIDEO ASSISTED THORACOSCOPY;  Surgeon: [name], MD;  Location: [place];  Service: Thoracic;  Laterality: Right;  ? ?Current Outpatient Medications on File Prior to Visit  ?Medication Sig Dispense Refill  ? albuterol (VENTOLIN HFA) 108 (90 Base) MCG/ACT inhaler INHALE 2 PUFFS BY MOUTH EVERY 6 HOURS AS NEEDED FOR WHEEZING FOR SHORTNESS OF BREATH 18 g 11  ? atorvastatin (LIPITOR) 80 MG tablet TAKE 1 TABLET BY MOUTH ONCE DAILY AT  6  PM 90 tablet 3  ? carvedilol (COREG) 25 MG tablet Take 1 tablet (25 mg total) by mouth 2 (two) times daily. 180 tablet 3  ? citalopram (CELEXA) 20 MG tablet Take 20 mg by mouth daily.    ? clopidogrel (PLAVIX) 75 MG tablet Take 1 tablet by mouth once daily 90 tablet 3  ? diphenoxylate-atropine (LOMOTIL) 2.5-0.025 MG tablet Take 1 tablet by mouth 4 (four) times daily as needed for diarrhea or loose stools. 30 tablet 0  ? empagliflozin (JARDIANCE) 10 MG TABS tablet Take 1 tablet (10 mg total) by mouth daily before breakfast. 30 tablet 11  ? furosemide (LASIX) 20 MG tablet Take 1 tablet (20 mg total) by mouth as needed for fluid or edema. 90 tablet 1  ? losartan (COZAAR) 25 MG tablet Take 1 tablet (25 mg total) by mouth daily. 90 tablet 3  ? meclizine (ANTIVERT) 25 MG tablet Take 1 tablet (25 mg total) by mouth 3 (three) times daily as needed for dizziness. 30 tablet 0  ? mexiletine (MEXITIL) 200 MG capsule Take 1 capsule by mouth twice daily 60 capsule 3  ? nilotinib (TASIGNA) 150 MG capsule TAKE 2 CAPSULES (300 MG TOTAL) BY MOUTH EVERY 12 (TWELVE) HOURS. TAKE ON AN EMPTY STOMACH, 1 HR BEFORE OR 2 HRS AFTER FOOD. 112  capsule 12  ? nitroGLYCERIN (NITROSTAT) 0.4 MG SL tablet Place 1 tablet (0.4 mg total) under the tongue every 5 (five) minutes as needed for chest pain. 25 tablet 4  ? ondansetron (ZOFRAN) 4 MG tablet Take 1 tablet (4 mg total) by mouth every 8 (eight) hours as needed for nausea or vomiting. 30 tablet 3  ? sildenafil (VIAGRA) 100 MG tablet Take 0.5-1 tablets (50-100 mg total) by mouth daily as needed for erectile dysfunction. 5 tablet 11  ? ?No current facility-administered medications on file prior to visit.  ? ?Allergies  ?Allergen Reactions  ? Breztri Aerosphere [Budeson-Glycopyrrol-Formoterol] Swelling  ?  Swelling of mouth tongue   ? Norvasc [Amlodipine Besylate] Rash  ?  rash  ? Sulfa Antibiotics  Hives  ?  Hives ?  ? ?Social History  ? ?Socioeconomic History  ? Marital status: Married  ?  Spouse name: Not on file  ? Number of children: 2  ? Years of education: Not on file  ? Highest education level: Not on file  ?Occupational History  ?  Comment: retired Development worker, international aid  ?Tobacco Use  ? Smoking status: Former  ?  Packs/day: 3.00  ?  Years: 45.00  ?  Pack years: 135.00  ?  Types: Cigarettes  ?  Quit date: 08/08/1997  ?  Years since quitting: 24.2  ?  Passive exposure: Past  ? Smokeless tobacco: Former  ?Vaping Use  ? Vaping Use: Never used  ?Substance and Sexual Activity  ? Alcohol use: No  ?  Alcohol/week: 0.0 standard drinks  ? Drug use: No  ? Sexual activity: Yes  ?Other Topics Concern  ? Not on file  ?Social History Narrative  ? Not on file  ? ?Social Determinants of Health  ? ?Financial Resource Strain: Low Risk   ? Difficulty of Paying Living Expenses: Not hard at all  ?Food Insecurity: No Food Insecurity  ? Worried About Charity fundraiser in the Last Year: Never true  ? Ran Out of Food in the Last Year: Never true  ?Transportation Needs: No Transportation Needs  ? Lack of Transportation (Medical): No  ? Lack of Transportation (Non-Medical): No  ?Physical Activity: Insufficiently Active  ? Days of Exercise per  Week: 3 days  ? Minutes of Exercise per Session: 30 min  ?Stress: No Stress Concern Present  ? Feeling of Stress : Not at all  ?Social Connections: Socially Integrated  ? Frequency of Communication with Fri

## 2021-11-18 NOTE — Progress Notes (Signed)
?Cardiology Office Note:   ? ?Date:  11/18/2021  ? ?ID:  Anthony Wolfe, DOB July 06, 1946, MRN [id number] ? ?PCP:  [name], MD  ? ?[facility] Providers ?Cardiologist:  Lenna Sciara, MD ?Referring MD: [name], MD  ? ?Chief Complaint/Reason for Referral: Dyspnea ? ?ASSESSMENT:   ? ?1. Atrial fibrillation, unspecified type ([place])   ?2. Cardiomyopathy, ischemic   ?3. Coronary artery disease involving native coronary artery of native heart without angina pectoris   ?4. Hyperlipidemia, unspecified hyperlipidemia type   ?5. Bilateral carotid artery disease, unspecified type ([place])   ?6. Aortic atherosclerosis ([place])   ? ? ?PLAN:   ? ?In order of problems listed above: ?1.  Atrial fibrillation:  Start Eliquis and Coreg.  Will stop mexiletine now.  Will refer for cardioversion in 4 weeks.  Left atrium is moderately dilated.  There is a chance we will be able to keep him in sinus rhythm.  Follow-up as previously scheduled. ?2.  Ischemic cardiomyopathy: Continue Jardiance and Coreg.  Restart losartan '25mg'$  QPM ?3.  Coronary artery disease: Continue beta-blocker and Plavix (due to carotid stenosis). ?4.  Hyperlipidemia: Continue statin lipid panel in July was at goal. ?5.  Bilateral carotid stenosis: Continue Plavix, PCP is monitoring carotid ultrasounds. ?6.  Aortic atherosclerosis: Continue Plavix, statin, and strict blood pressure control. ? ? ?     ? ?Shared Decision Making/Informed Consent ?The risks (stroke, cardiac arrhythmias rarely resulting in the need for a temporary or permanent pacemaker, skin irritation or burns and complications associated with conscious sedation including aspiration, arrhythmia, respiratory failure and death), benefits (restoration of normal sinus rhythm) and alternatives of a direct current cardioversion were explained in detail to Anthony Wolfe and he agrees to proceed.  ?  ? ?Dispo:  No follow-ups on file.  ? ?  ? ?Medication Adjustments/Labs and Tests Ordered: ?Current  medicines are reviewed at length with the patient today.  Concerns regarding medicines are outlined above. ? ?The following changes have been made:    ? ?Labs/tests ordered: ?No orders of the defined types were placed in this encounter. ? ? ?Medication Changes: ?No orders of the defined types were placed in this encounter. ? ? ? ?Current medicines are reviewed at length with the patient today.  The patient does not have concerns regarding medicines. ? ? ?History of Present Illness:   ? ?FOCUSED PROBLEM LIST:   ?1.  Coronary artery disease status post CABG consisting of a LIMA to LAD, vein graft to obtuse marginal 1 and 2, and vein graft to RCA in 2010; the vein graft to RCA is known occluded on cardiac catheterization in 2015 ?2.  Ischemic cardiomyopathy ?3.  VT status post ICD followed by EP ?4.  Hypertension ?5.  Hyperlipidemia ?6.  Peripheral vascular disease status post right carotid enterectomy with known occluded left internal carotid artery ?7.  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  Emphysema ?9.  Chronic occlusion of left internal carotid and 50 to 69% stenosis of right internal carotid followed by patient's PCP ?10.  Right bundle branch block ?11.  Atrial fibrillation on EKG February 2023 ?12.  Aortic atherosclerosis on chest CT 2018 ? ?The patient is a 76 y.o. male with the indicated medical history here for expedited follow-up due to new onset atrial fibrillation.  When I saw the patient last we obtained an echocardiogram which showed an ejection fraction of 40 to 45%.  His Entresto was stopped and losartan was started.  He was also started on  Jardiance and his Coreg was increased.  He was seen by his PCP with reports of shortness of breath for 3 weeks.  The patient stopped his losartan because it made him crazy.  An EKG done at his PCPs office demonstrated atrial fibrillation with a right bundle branch block.  He was started on Eliquis 5 mg twice daily. ? ?The patient tells me that he he stopped his losartan  because he could not sleep.  He still cannot sleep.  He has had shortness of breath which is worse over the last few months.  He does notice palpitations at times.  He has shortness of breath when he exerts himself but not at rest.  He denies any peripheral edema, paroxysmal nocturnal dyspnea, or exertional angina.  He fortunately has not required any emergency room visits or hospitalizations.  He has had no signs or symptoms of stroke. ? ? ?Current Medications: ?No outpatient medications have been marked as taking for the 11/19/21 encounter (Office Visit) with Early Osmond, MD.  ?  ? ?Allergies:    ?Breztri aerosphere [budeson-glycopyrrol-formoterol], Norvasc [amlodipine besylate], and Sulfa antibiotics  ? ?Social History:   ?Social History  ? ?Tobacco Use  ? Smoking status: Former  ?  Packs/day: 3.00  ?  Years: 45.00  ?  Pack years: 135.00  ?  Types: Cigarettes  ?  Quit date: 08/08/1997  ?  Years since quitting: 24.2  ?  Passive exposure: Past  ? Smokeless tobacco: Former  ?Vaping Use  ? Vaping Use: Never used  ?Substance Use Topics  ? Alcohol use: No  ?  Alcohol/week: 0.0 standard drinks  ? Drug use: No  ?  ? ?Family Hx: ?Family History  ?Problem Relation Age of Onset  ? Heart disease Mother   ? Alcohol abuse Father   ?  ? ?Review of Systems:   ?Please see the history of present illness.    ?All other systems reviewed and are negative. ?  ? ? ?EKGs/Labs/Other Test Reviewed:   ? ?EKG:  EKG performed April 2023 that I personally reviewed demonstrates atrial fibrillation with right bundle branch block ? ?Prior CV studies: ? ?Carotid ultrasound 2023 ?1. Mildly elevated peak systolic and end-diastolic velocities in the ?right internal carotid artery indicative of 50-69% stenosis. This ?has increased since prior examination. ?2. Chronic occlusion of proximal left internal carotid artery. ? ?TTE 2023 ?Ejection fraction of 40 to 29% grade 1 diastolic dysfunction, severe akinesis of the basal mid inferoseptal and  inferior wall, low normal right ventricular function and no significant valvular abnormalities ? ?TTE 2021 ?IMPRESSIONS  ? 1. Left ventricular ejection fraction, by estimation, is 40 to 45%. The  ?left ventricle has mildly decreased function. The left ventricle  ?demonstrates global hypokinesis. There is moderate left ventricular  ?hypertrophy. Left ventricular diastolic  ?parameters are indeterminate.  ? 2. Right ventricular systolic function is normal. The right ventricular  ?size is normal. There is normal pulmonary artery systolic pressure.  ? 3. Left atrial size was severely dilated.  ? 4. The mitral valve is normal in structure. Mild mitral valve  ?regurgitation. No evidence of mitral stenosis.  ? 5. The aortic valve is tricuspid. Aortic valve regurgitation is mild. No  ?aortic stenosis is present.  ? 6. The inferior vena cava is normal in size with greater than 50%  ?respiratory variability, suggesting right atrial pressure of 3 mmHg.  ?  ?  ?Lexiscan stress test 2018 ?Nuclear stress EF: 35%. ?Inferior/inferoseptal defect consistent with scar No  significant ischemia ?Intermediate risk study ?  ? ? ?Other studies Reviewed: ?Review of the additional studies/records demonstrates: Chest CT 2018 demonstrates aortic atherosclerosis ? ?Recent Labs: ?08/25/2021: ALT 8; Hemoglobin 10.6; Platelets 310 ?09/27/2021: BUN 10; Creatinine, Ser 0.76; Magnesium 1.9; Potassium 4.2; Sodium 144  ? ?Recent Lipid Panel ?Lab Results  ?Component Value Date/Time  ? CHOL 116 02/18/2021 08:35 AM  ? CHOL 102 03/15/2017 11:29 AM  ? TRIG 90 02/18/2021 08:35 AM  ? HDL 36 (L) 02/18/2021 08:35 AM  ? HDL 31 (L) 03/15/2017 11:29 AM  ? LDLCALC 63 02/18/2021 08:35 AM  ? ? ?Risk Assessment/Calculations:   ? ? ?CHA2DS2-VASc Score = 4  ? This indicates a 4.8% annual risk of stroke. ?The patient's score is based upon: ?CHF History: 0 ?HTN History: 1 ?Diabetes History: 0 ?Stroke History: 0 ?Vascular Disease History: 1 ?Age Score: 2 ?Gender Score: 0 ?   ? ?    ? ?Physical Exam:   ? ?VS:  There were no vitals taken for this visit.   ?Wt Readings from Last 3 Encounters:  ?11/18/21 181 lb 3.2 oz (82.2 kg)  ?11/11/21 180 lb (81.6 kg)  ?10/14/21 180 lb (81.6 kg)  ?  ?GEN

## 2021-11-19 ENCOUNTER — Encounter: Payer: Self-pay | Admitting: Internal Medicine

## 2021-11-19 ENCOUNTER — Ambulatory Visit: Payer: Medicare HMO | Admitting: Internal Medicine

## 2021-11-19 VITALS — BP 126/60 | HR 66 | Ht 69.0 in | Wt 181.0 lb

## 2021-11-19 DIAGNOSIS — I2510 Atherosclerotic heart disease of native coronary artery without angina pectoris: Secondary | ICD-10-CM

## 2021-11-19 DIAGNOSIS — E785 Hyperlipidemia, unspecified: Secondary | ICD-10-CM | POA: Diagnosis not present

## 2021-11-19 DIAGNOSIS — I4891 Unspecified atrial fibrillation: Secondary | ICD-10-CM | POA: Diagnosis not present

## 2021-11-19 DIAGNOSIS — I700 Atherosclerosis of aorta: Secondary | ICD-10-CM | POA: Diagnosis not present

## 2021-11-19 DIAGNOSIS — I255 Ischemic cardiomyopathy: Secondary | ICD-10-CM

## 2021-11-19 DIAGNOSIS — I779 Disorder of arteries and arterioles, unspecified: Secondary | ICD-10-CM

## 2021-11-19 MED ORDER — LOSARTAN POTASSIUM 25 MG PO TABS: 25.0000 mg | ORAL_TABLET | Freq: Every day | ORAL | 3 refills | Status: DC

## 2021-11-19 MED ORDER — APIXABAN 5 MG PO TABS: 5.0000 mg | ORAL_TABLET | Freq: Two times a day (BID) | ORAL | 6 refills | Status: DC

## 2021-11-19 NOTE — Patient Instructions (Signed)
Medication Instructions:  ?Your physician has recommended you make the following change in your medication:  ?1.) stop mexiletene ?2.) start losartan 25 mg - one tablet daily at bedtime ?3.) start Eliquis 5 mg  - take one tablet twice a day ? ? ?*If you need a refill on your cardiac medications before your next appointment, please call your pharmacy* ? ? ?Lab Work: ?None today ? ? ?Testing/Procedures: ?Your physician has recommended that you have a Cardioversion (DCCV). Electrical Cardioversion uses a jolt of electricity to your heart either through paddles or wired patches attached to your chest. This is a controlled, usually prescheduled, procedure. Defibrillation is done under light anesthesia in the hospital, and you usually go home the day of the procedure. This is done to get your heart back into a normal rhythm. You are not awake for the procedure. Please see the instruction sheet given to you today. ? ? ?Follow-Up: ?At [facility], you and your health needs are our priority.  As part of our continuing mission to provide you with exceptional heart care, we have created designated Provider Care Teams.  These Care Teams include your primary Cardiologist (physician) and Advanced Practice Providers (APPs -  Physician Assistants and Nurse Practitioners) who all work together to provide you with the care you need, when you need it. ? ? ?Your next appointment:   ?4 month(s) ? ?The format for your next appointment:   ?In Person ? ?Provider:   ?Early Osmond, MD { ? ? ?Other Instructions ?We will plan cardioversion after one month on Eliquis.  Will call you with details. ? ?Important Information About Sugar ? ? ? ? ?  ?

## 2021-11-24 ENCOUNTER — Telehealth: Payer: Self-pay | Admitting: Internal Medicine

## 2021-11-24 NOTE — Telephone Encounter (Signed)
?  Pt c/o medication issue: ? ?1. Name of Medication:  ? apixaban (ELIQUIS) 5 MG TABS tablet  ? ? ?2. How are you currently taking this medication (dosage and times per day)?  ?Take 1 tablet (5 mg total) by mouth 2 (two) times daily. ?3. Are you having a reaction (difficulty breathing--STAT)? No ? ?4. What is your medication issue? Pharmacist states that he needs clarification on medication.  ? ?

## 2021-11-24 NOTE — Telephone Encounter (Signed)
Spoke w [facility] Pharmacist who wanted to clarify okay to take Plavix and Eliquis.  Adv per note pt taking Plavix prescribed by PCP for carotid stenosis.  And now he has afib, thus the Eliquis.   He asked me to again clarify if he will continue both medications.  I adv he is to take both, and then told him I would send to doctor/pharmD to confirm.  He said the patient's wife is already of the understanding that the pt is to remain on Plavix and start Eliquis.   No need to call pharmacist back unless something is to be different. ? ?  ?

## 2021-11-29 ENCOUNTER — Telehealth: Payer: Self-pay

## 2021-11-29 ENCOUNTER — Other Ambulatory Visit: Payer: Medicare HMO | Admitting: *Deleted

## 2021-11-29 ENCOUNTER — Ambulatory Visit (INDEPENDENT_AMBULATORY_CARE_PROVIDER_SITE_OTHER): Payer: Medicare HMO

## 2021-11-29 ENCOUNTER — Telehealth: Payer: Self-pay | Admitting: Internal Medicine

## 2021-11-29 DIAGNOSIS — R0602 Shortness of breath: Secondary | ICD-10-CM

## 2021-11-29 DIAGNOSIS — I255 Ischemic cardiomyopathy: Secondary | ICD-10-CM | POA: Diagnosis not present

## 2021-11-29 NOTE — Telephone Encounter (Signed)
Pt called to stating that he can not gt an appt with cardiology until 5/4. Pt also stated that he is having a hard breathing. Advice pt that if he is having  hard breathing then he needs to go to the ER.  ? ?I will f/u with referrals to check on appt. ?

## 2021-11-29 NOTE — Telephone Encounter (Signed)
Anthony Wolfe states that pt was seen in their office and their provider states that pt needs an Urgent appt. Due to his heart may needing to be shocked. Please advise ?

## 2021-11-29 NOTE — Telephone Encounter (Signed)
Returned call to the patient.  He is SOB sounding on the phone.  He said he is okay at rest but with any exertion such as rolling over in bed he gets winded easily and takes a few minutes to recover.  He is taking Plavix and taking his Eliquis 5 mg BID.   ? ?Planning for DCCV after 1 month of blood thinner.   ? ?Reviewed with Dr. Ali Lowe.  Orders received for labs (CBC, CMET). Pt will come to office today for this and is aware he will be called after they are reviewed by MD.   ? ? ?

## 2021-11-30 LAB — CUP PACEART REMOTE DEVICE CHECK
Battery Remaining Longevity: 41 mo
Battery Voltage: 2.97 V
Brady Statistic RV Percent Paced: 0.04 %
Date Time Interrogation Session: 20230424012503
HighPow Impedance: 37 Ohm
Implantable Lead Implant Date: 20151014
Implantable Lead Location: 753860
Implantable Pulse Generator Implant Date: 20151014
Lead Channel Impedance Value: 342 Ohm
Lead Channel Impedance Value: 399 Ohm
Lead Channel Pacing Threshold Amplitude: 0.75 V
Lead Channel Pacing Threshold Pulse Width: 0.4 ms
Lead Channel Sensing Intrinsic Amplitude: 3.625 mV
Lead Channel Sensing Intrinsic Amplitude: 3.625 mV
Lead Channel Setting Pacing Amplitude: 2 V
Lead Channel Setting Pacing Pulse Width: 0.4 ms
Lead Channel Setting Sensing Sensitivity: 0.3 mV

## 2021-11-30 LAB — CBC
Hematocrit: 30.9 % — ABNORMAL LOW (ref 37.5–51.0)
Hemoglobin: 10.4 g/dL — ABNORMAL LOW (ref 13.0–17.7)
MCH: 30.7 pg (ref 26.6–33.0)
MCHC: 33.7 g/dL (ref 31.5–35.7)
MCV: 91 fL (ref 79–97)
Platelets: 228 10*3/uL (ref 150–450)
RBC: 3.39 x10E6/uL — ABNORMAL LOW (ref 4.14–5.80)
RDW: 14.2 % (ref 11.6–15.4)
WBC: 8.9 10*3/uL (ref 3.4–10.8)

## 2021-11-30 LAB — COMPREHENSIVE METABOLIC PANEL
ALT: 25 IU/L (ref 0–44)
AST: 18 IU/L (ref 0–40)
Albumin/Globulin Ratio: 2.4 — ABNORMAL HIGH (ref 1.2–2.2)
Albumin: 4.1 g/dL (ref 3.7–4.7)
Alkaline Phosphatase: 56 IU/L (ref 44–121)
BUN/Creatinine Ratio: 18 (ref 10–24)
BUN: 16 mg/dL (ref 8–27)
Bilirubin Total: 0.5 mg/dL (ref 0.0–1.2)
CO2: 24 mmol/L (ref 20–29)
Calcium: 8.9 mg/dL (ref 8.6–10.2)
Chloride: 106 mmol/L (ref 96–106)
Creatinine, Ser: 0.91 mg/dL (ref 0.76–1.27)
Globulin, Total: 1.7 g/dL (ref 1.5–4.5)
Glucose: 119 mg/dL — ABNORMAL HIGH (ref 70–99)
Potassium: 4.2 mmol/L (ref 3.5–5.2)
Sodium: 143 mmol/L (ref 134–144)
Total Protein: 5.8 g/dL — ABNORMAL LOW (ref 6.0–8.5)
eGFR: 87 mL/min/{1.73_m2} (ref >59)

## 2021-12-03 ENCOUNTER — Encounter: Payer: Self-pay | Admitting: *Deleted

## 2021-12-03 NOTE — Telephone Encounter (Signed)
Called patient and reviewed that per Dr. Ali Lowe: ? ? ?Anthony Osmond, MD  Rodman Key, RN ?Labs look fine, not anemic.  If he is still having a lot of dyspnea, let's have him see me or DOD depending on how he is doing.  Thanks.  ? ? ?Pt states he is feeling the same.  As long as he sits he is fine but getting up and walking to the kitchen causing him SOB and some fatigue.  I scheduled him with Dr. Ali Lowe on Monday 12/06/21 per the notes above.   I have arranged his DCCV for 12/20/21, which is 30 days after starting Eliquis. ? ?I reviewed instructions with him.  Will leave printed instruction letter for him to pick up Monday when he comes to see Dr. Ali Lowe. ? ?

## 2021-12-06 ENCOUNTER — Emergency Department (HOSPITAL_COMMUNITY): Payer: Medicare HMO

## 2021-12-06 ENCOUNTER — Other Ambulatory Visit: Payer: Self-pay

## 2021-12-06 ENCOUNTER — Encounter: Payer: Self-pay | Admitting: Internal Medicine

## 2021-12-06 ENCOUNTER — Observation Stay (HOSPITAL_COMMUNITY)
Admission: EM | Admit: 2021-12-06 | Discharge: 2021-12-07 | Disposition: A | Discharge status: Home / Self Care | Payer: Medicare HMO | Attending: Internal Medicine | Admitting: Internal Medicine

## 2021-12-06 ENCOUNTER — Ambulatory Visit: Payer: Medicare HMO | Admitting: Internal Medicine

## 2021-12-06 ENCOUNTER — Encounter (HOSPITAL_COMMUNITY): Payer: Self-pay

## 2021-12-06 VITALS — BP 132/74 | HR 3 | Ht 69.0 in | Wt 182.0 lb

## 2021-12-06 DIAGNOSIS — J449 Chronic obstructive pulmonary disease, unspecified: Secondary | ICD-10-CM | POA: Diagnosis not present

## 2021-12-06 DIAGNOSIS — I255 Ischemic cardiomyopathy: Secondary | ICD-10-CM

## 2021-12-06 DIAGNOSIS — Z955 Presence of coronary angioplasty implant and graft: Secondary | ICD-10-CM | POA: Diagnosis not present

## 2021-12-06 DIAGNOSIS — I700 Atherosclerosis of aorta: Secondary | ICD-10-CM

## 2021-12-06 DIAGNOSIS — J90 Pleural effusion, not elsewhere classified: Secondary | ICD-10-CM | POA: Diagnosis not present

## 2021-12-06 DIAGNOSIS — R0609 Other forms of dyspnea: Secondary | ICD-10-CM | POA: Diagnosis present

## 2021-12-06 DIAGNOSIS — Z7901 Long term (current) use of anticoagulants: Secondary | ICD-10-CM | POA: Insufficient documentation

## 2021-12-06 DIAGNOSIS — J439 Emphysema, unspecified: Secondary | ICD-10-CM | POA: Diagnosis not present

## 2021-12-06 DIAGNOSIS — Z8673 Personal history of transient ischemic attack (TIA), and cerebral infarction without residual deficits: Secondary | ICD-10-CM | POA: Insufficient documentation

## 2021-12-06 DIAGNOSIS — R0602 Shortness of breath: Secondary | ICD-10-CM | POA: Diagnosis not present

## 2021-12-06 DIAGNOSIS — Z79899 Other long term (current) drug therapy: Secondary | ICD-10-CM | POA: Diagnosis not present

## 2021-12-06 DIAGNOSIS — E785 Hyperlipidemia, unspecified: Secondary | ICD-10-CM | POA: Diagnosis not present

## 2021-12-06 DIAGNOSIS — R0600 Dyspnea, unspecified: Secondary | ICD-10-CM

## 2021-12-06 DIAGNOSIS — R918 Other nonspecific abnormal finding of lung field: Secondary | ICD-10-CM | POA: Diagnosis not present

## 2021-12-06 DIAGNOSIS — I110 Hypertensive heart disease with heart failure: Secondary | ICD-10-CM | POA: Insufficient documentation

## 2021-12-06 DIAGNOSIS — Z95810 Presence of automatic (implantable) cardiac defibrillator: Secondary | ICD-10-CM | POA: Insufficient documentation

## 2021-12-06 DIAGNOSIS — I2510 Atherosclerotic heart disease of native coronary artery without angina pectoris: Secondary | ICD-10-CM

## 2021-12-06 DIAGNOSIS — Z7902 Long term (current) use of antithrombotics/antiplatelets: Secondary | ICD-10-CM | POA: Insufficient documentation

## 2021-12-06 DIAGNOSIS — C9210 Chronic myeloid leukemia, BCR/ABL-positive, not having achieved remission: Secondary | ICD-10-CM | POA: Insufficient documentation

## 2021-12-06 DIAGNOSIS — I4891 Unspecified atrial fibrillation: Secondary | ICD-10-CM

## 2021-12-06 DIAGNOSIS — I5020 Unspecified systolic (congestive) heart failure: Secondary | ICD-10-CM | POA: Diagnosis present

## 2021-12-06 DIAGNOSIS — I779 Disorder of arteries and arterioles, unspecified: Secondary | ICD-10-CM | POA: Diagnosis not present

## 2021-12-06 DIAGNOSIS — Z951 Presence of aortocoronary bypass graft: Secondary | ICD-10-CM | POA: Diagnosis not present

## 2021-12-06 DIAGNOSIS — I5022 Chronic systolic (congestive) heart failure: Secondary | ICD-10-CM | POA: Diagnosis not present

## 2021-12-06 DIAGNOSIS — R911 Solitary pulmonary nodule: Secondary | ICD-10-CM | POA: Diagnosis present

## 2021-12-06 DIAGNOSIS — I252 Old myocardial infarction: Secondary | ICD-10-CM | POA: Insufficient documentation

## 2021-12-06 DIAGNOSIS — Z7984 Long term (current) use of oral hypoglycemic drugs: Secondary | ICD-10-CM | POA: Diagnosis not present

## 2021-12-06 LAB — CBC WITH DIFFERENTIAL/PLATELET
Abs Immature Granulocytes: 0.05 10*3/uL (ref 0.00–0.07)
Basophils Absolute: 0.1 10*3/uL (ref 0.0–0.1)
Basophils Relative: 1 %
Eosinophils Absolute: 0.1 10*3/uL (ref 0.0–0.5)
Eosinophils Relative: 1 %
HCT: 32.7 % — ABNORMAL LOW (ref 39.0–52.0)
Hemoglobin: 10.6 g/dL — ABNORMAL LOW (ref 13.0–17.0)
Immature Granulocytes: 1 %
Lymphocytes Relative: 10 %
Lymphs Abs: 0.9 10*3/uL (ref 0.7–4.0)
MCH: 30.5 pg (ref 26.0–34.0)
MCHC: 32.4 g/dL (ref 30.0–36.0)
MCV: 94 fL (ref 80.0–100.0)
Monocytes Absolute: 0.9 10*3/uL (ref 0.1–1.0)
Monocytes Relative: 10 %
Neutro Abs: 7.3 10*3/uL (ref 1.7–7.7)
Neutrophils Relative %: 77 %
Platelets: 243 10*3/uL (ref 150–400)
RBC: 3.48 MIL/uL — ABNORMAL LOW (ref 4.22–5.81)
RDW: 14.9 % (ref 11.5–15.5)
WBC: 9.4 10*3/uL (ref 4.0–10.5)
nRBC: 0 % (ref 0.0–0.2)

## 2021-12-06 LAB — TROPONIN I (HIGH SENSITIVITY)
Troponin I (High Sensitivity): 19 ng/L — ABNORMAL HIGH (ref <18)
Troponin I (High Sensitivity): 15 ng/L (ref <18)

## 2021-12-06 LAB — BASIC METABOLIC PANEL
Anion gap: 8 (ref 5–15)
BUN: 10 mg/dL (ref 8–23)
CO2: 24 mmol/L (ref 22–32)
Calcium: 8.5 mg/dL — ABNORMAL LOW (ref 8.9–10.3)
Chloride: 109 mmol/L (ref 98–111)
Creatinine, Ser: 1.06 mg/dL (ref 0.61–1.24)
GFR, Estimated: >60 mL/min (ref >60)
Glucose, Bld: 121 mg/dL — ABNORMAL HIGH (ref 70–99)
Potassium: 3.9 mmol/L (ref 3.5–5.1)
Sodium: 141 mmol/L (ref 135–145)

## 2021-12-06 LAB — BRAIN NATRIURETIC PEPTIDE: B Natriuretic Peptide: 719.7 pg/mL — ABNORMAL HIGH (ref 0.0–100.0)

## 2021-12-06 MED ORDER — IPRATROPIUM-ALBUTEROL 0.5-2.5 (3) MG/3ML IN SOLN: 3.0000 mL | Freq: Four times a day (QID) | RESPIRATORY_TRACT | Status: DC | PRN

## 2021-12-06 MED ORDER — CLOPIDOGREL BISULFATE 75 MG PO TABS
75.0000 mg | ORAL_TABLET | Freq: Every day | ORAL | Status: DC
  Administered 2021-12-07: 75 mg via ORAL
  Filled 2021-12-06: qty 1

## 2021-12-06 MED ORDER — ATORVASTATIN CALCIUM 80 MG PO TABS
80.0000 mg | ORAL_TABLET | Freq: Every day | ORAL | Status: DC
  Administered 2021-12-07: 80 mg via ORAL
  Filled 2021-12-06: qty 1

## 2021-12-06 MED ORDER — CARVEDILOL 3.125 MG PO TABS
25.0000 mg | ORAL_TABLET | Freq: Two times a day (BID) | ORAL | Status: DC
  Administered 2021-12-06 – 2021-12-07 (×2): 25 mg via ORAL
  Filled 2021-12-06 (×2): qty 8

## 2021-12-06 MED ORDER — IOHEXOL 350 MG/ML SOLN
100.0000 mL | Freq: Once | INTRAVENOUS | Status: AC | PRN
  Administered 2021-12-06: 100 mL via INTRAVENOUS

## 2021-12-06 MED ORDER — DEXTROSE 50 % IV SOLN
INTRAVENOUS | Status: AC
Start: 2021-12-06 — End: 2021-12-07
  Filled 2021-12-06: qty 50

## 2021-12-06 MED ORDER — ALBUTEROL SULFATE HFA 108 (90 BASE) MCG/ACT IN AERS
2.0000 | INHALATION_SPRAY | Freq: Once | RESPIRATORY_TRACT | Status: AC
  Administered 2021-12-06: 2 via RESPIRATORY_TRACT
  Filled 2021-12-06: qty 6.7

## 2021-12-06 MED ORDER — NILOTINIB HCL 150 MG PO CAPS: 300.0000 mg | ORAL_CAPSULE | Freq: Two times a day (BID) | ORAL | Status: DC

## 2021-12-06 MED ORDER — ONDANSETRON HCL 4 MG PO TABS: 4.0000 mg | ORAL_TABLET | Freq: Four times a day (QID) | ORAL | Status: DC | PRN

## 2021-12-06 MED ORDER — ONDANSETRON HCL 4 MG/2ML IJ SOLN
4.0000 mg | Freq: Four times a day (QID) | INTRAMUSCULAR | Status: DC | PRN
Start: 2021-12-06 — End: 2021-12-07

## 2021-12-06 MED ORDER — LOSARTAN POTASSIUM 50 MG PO TABS
25.0000 mg | ORAL_TABLET | Freq: Every day | ORAL | Status: DC
  Administered 2021-12-06: 25 mg via ORAL
  Filled 2021-12-06: qty 1

## 2021-12-06 MED ORDER — ACETAMINOPHEN 325 MG PO TABS: 650.0000 mg | ORAL_TABLET | Freq: Four times a day (QID) | ORAL | Status: DC | PRN

## 2021-12-06 MED ORDER — UMECLIDINIUM BROMIDE 62.5 MCG/ACT IN AEPB: 1.0000 | INHALATION_SPRAY | Freq: Every day | RESPIRATORY_TRACT | Status: DC

## 2021-12-06 MED ORDER — ACETAMINOPHEN 650 MG RE SUPP
650.0000 mg | Freq: Four times a day (QID) | RECTAL | Status: DC | PRN
Start: 2021-12-06 — End: 2021-12-07

## 2021-12-06 NOTE — ED Triage Notes (Signed)
Pt reports 2 weeks of SOB and significant DOE, sent from cardiologist office for further workup. Denies chest pain. Pt sob at rest during triage. ?

## 2021-12-06 NOTE — Patient Instructions (Signed)
Medication Instructions:  ?Your physician recommends that you continue on your current medications as directed. Please refer to the Current Medication list given to you today. ? ?*If you need a refill on your cardiac medications before your next appointment, please call your pharmacy* ? ? ?Lab Work: ?none ?If you have labs (blood work) drawn today and your tests are completely normal, you will receive your results only by: ?MyChart Message (if you have MyChart) OR ?A paper copy in the mail ?If you have any lab test that is abnormal or we need to change your treatment, we will call you to review the results. ? ? ?Testing/Procedures: ?none ? ? ?Follow-Up: ?At [facility], you and your health needs are our priority.  As part of our continuing mission to provide you with exceptional heart care, we have created designated Provider Care Teams.  These Care Teams include your primary Cardiologist (physician) and Advanced Practice Providers (APPs -  Physician Assistants and Nurse Practitioners) who all work together to provide you with the care you need, when you need it. ? ?We recommend signing up for the patient portal called "MyChart".  Sign up information is provided on this After Visit Summary.  MyChart is used to connect with patients for Virtual Visits (Telemedicine).  Patients are able to view lab/test results, encounter notes, upcoming appointments, etc.  Non-urgent messages can be sent to your provider as well.   ?To learn more about what you can do with MyChart, go to [email or web address].   ? ?Your next appointment:   ? ? ?The format for your next appointment:   ?In Person ? ?Provider:   ?Early Osmond, MD   ? ? ?Other Instructions ? Dr Ali Lowe recommends you go to ED at [facility] ? ?Important Information About Sugar ? ? ? ? ? ? ?

## 2021-12-06 NOTE — ED Provider Triage Note (Addendum)
Emergency Medicine Provider Triage Evaluation Note ? ?Anthony Wolfe , a 76 y.o. male  was evaluated in triage.  Pt complains of SHOB onset 2 weeks ago when heart got out of rhythm, started on Eliquis and is scheduled for cardioversion in 2 weeks. ?Wife states he is breathing like he did when he had fluid on his lungs from a cancer pill, med was changed and he has been ok until now.  ?Sent by cardiology who told him its not his heart making him [facility]. ?Review of Systems  ?Positive: SHOB ?Negative: CP, LE edema ? ?Physical Exam  ?BP 109/87 (BP Location: Right Arm)   Pulse 98   Temp 97.7 ?F (36.5 ?C) (Oral)   Resp 16   Ht '5\' 9"'$  (1.753 m)   Wt 82.6 kg   SpO2 97%   BMI 26.88 kg/m?  ?Gen:   Awake, no distress   ?Resp:  Increased resp effort ?MSK:   Moves extremities without difficulty  ?Other:   ? ?Medical Decision Making  ?Medically screening exam initiated at 9:40 AM.  Appropriate orders placed.  Anthony Wolfe was informed that the remainder of the evaluation will be completed by another provider, this initial triage assessment does not replace that evaluation, and the importance of remaining in the ED until their evaluation is complete. ? ? ?  ?Tacy Learn, PA-C ?12/06/21 0539 ? ?  ?Tacy Learn, PA-C ?12/06/21 [phone] ? ?

## 2021-12-06 NOTE — Hospital Course (Signed)
2 weeks. Trouble walking about 25 feet.  ? ?4 or 5 years ago. 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 Changed his medications and no evidence of pleural effusion.  ? ?On blood thinner recently 14 more days of eliquis.  ? ?Saw Dr. Ali Lowe.  ?Took lasix 2-3 weeks ago. Why did you take it then?  ? ?Once a twice a day uses albuterol inhaler over the last two weeks. Does not help.  ? ?Can't breathe lying flat. This has been ongoing for awhile.  ? ?No chest pain. No swelling in his legs.  ? ?Before 2 weeks ago could walk up a set of steps.  ?Started 2 weeks ago on eliquis.  ? ? ?175 and 182 lbs usually. Feels like he gained some wieght.  ?Drank gatorade. No fever or chills. Chronic orthopnea. Decreased  ?PMH:  ? ?Medication: Wife manages medicines  ? ?PSH:  ? ? ?SH:  ?Lives at home with spouse, been together 15 years  ?Retired for 15 years, worked for [employer] ?Quit smoking 25 years ago. Smoked 2-3 packs a day for about 20 to 25 years ?No alcohol, or other drugs  ? ?FH: Mom lived to 34, esophageal cancer  ?No cancers ?Dad alcoholism ?Son 4v CABG ? ? ?Full code  ?

## 2021-12-06 NOTE — ED Notes (Signed)
PT OFF UNIT TO CT.

## 2021-12-06 NOTE — ED Provider Notes (Signed)
?[facility] ?Provider Note ? ? ?CSN: [id number] ?Arrival date & time: 12/06/21  8502 ? ?  ? ?History ? ?Chief Complaint  ?Patient presents with  ? Shortness of Breath  ? ? ?Anthony Wolfe is a 76 y.o. male. ? ?76 year old male with prior medical history as detailed below presents for evaluation of shortness of breath. ? ?Patient reports recent diagnosis of atrial fibrillation approximately 2 weeks ago.  Patient is currently on Eliquis.  Patient is scheduled for cardioversion attempt in approximately 2 weeks on May 15. ? ?Patient reports persistent shortness of breath especially with exertion over the last 2 weeks. ? ?Patient was seen by his cardiologist Dr. Ali Lowe this morning.  He was referred to the ED for evaluation ?Per cardiology's notes, patient's symptoms are unlikely related to atrial fibrillation.  Patient is compliant with Eliquis. ? ?Patient with history of thoracentesis and bilateral pleural effusions. ? ? ?The history is provided by the patient and medical records.  ?Shortness of Breath ?Severity:  Moderate ?Onset quality:  Gradual ?Duration:  2 weeks ?Timing:  Constant ?Progression:  Worsening ?Chronicity:  New ?Context: activity   ?Relieved by:  Nothing ? ?  ? ?Home Medications ?Prior to Admission medications   ?Medication Sig Start Date End Date Taking? Authorizing Provider  ?albuterol (VENTOLIN HFA) 108 (90 Base) MCG/ACT inhaler INHALE 2 PUFFS BY MOUTH EVERY 6 HOURS AS NEEDED FOR WHEEZING FOR SHORTNESS OF BREATH 06/04/21   [name], MD  ?apixaban (ELIQUIS) 5 MG TABS tablet Take 1 tablet (5 mg total) by mouth 2 (two) times daily. 11/19/21   Early Osmond, MD  ?atorvastatin (LIPITOR) 80 MG tablet TAKE 1 TABLET BY MOUTH ONCE DAILY AT  6  PM 09/29/21   [name], MD  ?carvedilol (COREG) 25 MG tablet Take 1 tablet (25 mg total) by mouth 2 (two) times daily. 10/14/21   Early Osmond, MD  ?clopidogrel (PLAVIX) 75 MG tablet Take 1 tablet by mouth  once daily 09/29/21   [name], MD  ?diphenoxylate-atropine (LOMOTIL) 2.5-0.025 MG tablet Take 1 tablet by mouth 4 (four) times daily as needed for diarrhea or loose stools. 06/04/21   [name], MD  ?empagliflozin (JARDIANCE) 10 MG TABS tablet Take 1 tablet (10 mg total) by mouth daily before breakfast. 10/14/21   Early Osmond, MD  ?furosemide (LASIX) 20 MG tablet Take 1 tablet (20 mg total) by mouth as needed for fluid or edema. 09/27/21   Baldwin Jamaica, PA-C  ?losartan (COZAAR) 25 MG tablet Take 1 tablet (25 mg total) by mouth at bedtime. 11/19/21   Early Osmond, MD  ?meclizine (ANTIVERT) 25 MG tablet Take 1 tablet (25 mg total) by mouth 3 (three) times daily as needed for dizziness. 04/21/20   [name], MD  ?nilotinib (TASIGNA) 150 MG capsule TAKE 2 CAPSULES (300 MG TOTAL) BY MOUTH EVERY 12 (TWELVE) HOURS. TAKE ON AN EMPTY STOMACH, 1 HR BEFORE OR 2 HRS AFTER FOOD. 06/29/21   [name], MD  ?nitroGLYCERIN (NITROSTAT) 0.4 MG SL tablet Place 1 tablet (0.4 mg total) under the tongue every 5 (five) minutes as needed for chest pain. 09/16/19   Isaiah Serge, NP  ?ondansetron (ZOFRAN) 4 MG tablet Take 1 tablet (4 mg total) by mouth every 8 (eight) hours as needed for nausea or vomiting. 06/04/21   [name], MD  ?sildenafil (VIAGRA) 100 MG tablet Take 0.5-1 tablets (50-100 mg total) by mouth daily as needed  for erectile dysfunction. 09/06/16   [name], MD  ?   ? ?Allergies    ?Breztri aerosphere [budeson-glycopyrrol-formoterol], Norvasc [amlodipine besylate], and Sulfa antibiotics   ? ?Review of Systems   ?Review of Systems  ?Respiratory:  Positive for shortness of breath.   ?All other systems reviewed and are negative. ? ?Physical Exam ?Updated Vital Signs ?BP (!) 160/86   Pulse 94   Temp 97.7 ?F (36.5 ?C) (Oral)   Resp (!) 26   Ht '5\' 9"'$  (1.753 m)   Wt 82.6 kg   SpO2 96%   BMI 26.88 kg/m?  ?Physical Exam ?Vitals and nursing note reviewed.  ?Constitutional:   ?   General:  He is not in acute distress. ?   Appearance: Normal appearance. He is well-developed.  ?HENT:  ?   Head: Normocephalic and atraumatic.  ?Eyes:  ?   Conjunctiva/sclera: Conjunctivae normal.  ?   Pupils: Pupils are equal, round, and reactive to light.  ?Cardiovascular:  ?   Rate and Rhythm: Normal rate and regular rhythm.  ?   Heart sounds: Normal heart sounds.  ?Pulmonary:  ?   Effort: Pulmonary effort is normal. No respiratory distress.  ?   Comments: Mild scattered diffuse expiratory wheezes.  Mildly decreased breath sounds at the left base. ?Abdominal:  ?   General: There is no distension.  ?   Palpations: Abdomen is soft.  ?   Tenderness: There is no abdominal tenderness.  ?Musculoskeletal:     ?   General: No deformity. Normal range of motion.  ?   Cervical back: Normal range of motion and neck supple.  ?Skin: ?   General: Skin is warm and dry.  ?Neurological:  ?   General: No focal deficit present.  ?   Mental Status: He is alert and oriented to person, place, and time.  ? ? ?ED Results / Procedures / Treatments   ?Labs ?(all labs ordered are listed, but only abnormal results are displayed) ?Labs Reviewed  ?BASIC METABOLIC PANEL - Abnormal; Notable for the following components:  ?    Result Value  ? Glucose, Bld 121 (*)   ? Calcium 8.5 (*)   ? All other components within normal limits  ?CBC WITH DIFFERENTIAL/PLATELET - Abnormal; Notable for the following components:  ? RBC 3.48 (*)   ? Hemoglobin 10.6 (*)   ? HCT 32.7 (*)   ? All other components within normal limits  ?BRAIN NATRIURETIC PEPTIDE - Abnormal; Notable for the following components:  ? B Natriuretic Peptide 719.7 (*)   ? All other components within normal limits  ?TROPONIN I (HIGH SENSITIVITY) - Abnormal; Notable for the following components:  ? Troponin I (High Sensitivity) 19 (*)   ? All other components within normal limits  ?TROPONIN I (HIGH SENSITIVITY)  ? ? ?EKG ?EKG Interpretation ? ?Date/Time:  Monday Dec 06 2021 09:40:11 EDT ?Ventricular  Rate:  94 ?PR Interval:    ?QRS Duration: 154 ?QT Interval:  440 ?QTC Calculation: 550 ?R Axis:   -81 ?Text Interpretation: Atrial fibrillation with premature ventricular or aberrantly conducted complexes Left axis deviation Right bundle branch block Inferior infarct , age undetermined Abnormal ECG When compared with ECG of 06-Aug-2021 15:36, PREVIOUS ECG IS PRESENT Confirmed by Dene Gentry [phone]) on 12/06/2021 10:47:50 AM ? ?Radiology ?DG Chest 2 View ? ?Result Date: 12/06/2021 ?CLINICAL DATA:  Shortness of breath. EXAM: CHEST - 2 VIEW COMPARISON:  August 06, 2021. FINDINGS: Stable cardiomegaly. Left-sided defibrillator is unchanged. Status post  coronary bypass graft. Mild bilateral pleural effusions are noted which are increased compared to prior exam. Mild bibasilar subsegmental atelectasis or scarring is noted. Increased rounded density is seen laterally in the right lung base which may simply represent loculated pleural effusion, but mass cannot be excluded. Bony thorax is unremarkable. IMPRESSION: Mild bilateral pleural effusions are noted which are increased compared to prior exam. Mild bibasilar subsegmental atelectasis or scarring is noted. Increased rounded density is seen laterally in right lung base which may represent loculated pleural effusion, but mass or neoplasm cannot be excluded. CT scan of the chest is recommended for further evaluation. Electronically Signed   By: Marijo Conception M.D.   On: 12/06/2021 11:19  ? ?CT Angio Chest PE W and/or Wo Contrast ? ?Result Date: 12/06/2021 ?CLINICAL DATA:  Pulmonary embolism suspected. EXAM: CT ANGIOGRAPHY CHEST WITH CONTRAST TECHNIQUE: Multidetector CT imaging of the chest was performed using the standard protocol during bolus administration of intravenous contrast. Multiplanar CT image reconstructions and MIPs were obtained to evaluate the vascular anatomy. RADIATION DOSE REDUCTION: This exam was performed according to the departmental dose-optimization  program which includes automated exposure control, adjustment of the mA and/or kV according to patient size and/or use of iterative reconstruction technique. CONTRAST:  156m OMNIPAQUE IOHEXOL 350 MG/ML SOLN COMPARISO

## 2021-12-06 NOTE — Progress Notes (Signed)
?Cardiology Office Note:   ? ?Date:  12/06/2021  ? ?ID:  Anthony Wolfe, DOB 1945-09-24, MRN [id number] ? ?PCP:  [name], MD  ? ?[facility] Providers ?Cardiologist:  Lenna Sciara, MD ?Referring MD: [name], MD  ? ?Chief Complaint/Reason for Referral: Dyspnea ? ?ASSESSMENT:   ? ?1. Dyspnea, unspecified type   ?2. Atrial fibrillation, unspecified type ([place])   ?3. Cardiomyopathy, ischemic   ?4. Coronary artery disease involving native coronary artery of native heart without angina pectoris   ?5. Hyperlipidemia, unspecified hyperlipidemia type   ?6. Bilateral carotid artery disease, unspecified type ([place])   ?7. Aortic atherosclerosis ([place])   ? ? ? ?PLAN:   ? ?In order of problems listed above: ?1.  Dyspnea: The patient is quite dyspneic at rest.  He has had a history of pleural effusions requiring thoracentesis.  I think I will have him proceed to the emergency department for further evaluation including imaging.  I think a PE is less likely given the fact he is on anticoagulation.  I do not think his atrial fibrillation would be causing this degree of dyspnea.  He is visibly dyspneic but not wheezing so COPD exacerbation may be less likely.  Right now he is scheduled for an outpatient cardioversion after he has been on anticoagulation for about a month but this could certainly be changed to a TEE cardioversion if he gets admitted to the hospital.  I will have him go to the emergency department for further evaluation. ?2.  Atrial fibrillation: The patient is scheduled for cardioversion in the near future after he has been on anticoagulation for for 4 weeks. ?3.  Ischemic cardiomyopathy: Continue current medications for now.  He does not seem volume overloaded on exam today. ?4.  Coronary artery disease: Continue beta-blocker and Plavix (due to carotid stenosis). ?5.  Hyperlipidemia: Continue statin lipid panel in July was at goal. ?6.  Bilateral carotid stenosis: Continue Plavix, PCP is monitoring  carotid ultrasounds. ?7.  Aortic atherosclerosis: Continue Plavix, statin, and strict blood pressure control. ? ? ?Dispo: Follow-up as previously scheduled. ? ?  ? ?Medication Adjustments/Labs and Tests Ordered: ?Current medicines are reviewed at length with the patient today.  Concerns regarding medicines are outlined above. ? ?The following changes have been made:    ? ?Labs/tests ordered: ?Orders Placed This Encounter  ?Procedures  ? EKG 12-Lead  ? ? ?Medication Changes: ?No orders of the defined types were placed in this encounter. ? ? ? ?Current medicines are reviewed at length with the patient today.  The patient does not have concerns regarding medicines. ? ? ?History of Present Illness:   ? ?FOCUSED PROBLEM LIST:   ?1.  Coronary artery disease status post CABG consisting of a LIMA to LAD, vein graft to obtuse marginal 1 and 2, and vein graft to RCA in 2010; the vein graft to RCA is known occluded on cardiac catheterization in 2015 ?2.  Ischemic cardiomyopathy ?3.  VT status post ICD followed by EP ?4.  Hypertension ?5.  Hyperlipidemia ?6.  Peripheral vascular disease status post right carotid enterectomy with known occluded left internal carotid artery ?7.  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  Emphysema ?9.  Chronic occlusion of left internal carotid and 50 to 69% stenosis of right internal carotid followed by patient's PCP ?10.  Right bundle branch block ?11.  Atrial fibrillation on EKG February 2023 ?12.  Aortic atherosclerosis on chest CT 2018 ? ?March 2023: Patient seen for initial consultation to reestablish  cardiovascular care.  At that appointment losartan was started as well as Jardiance and his Coreg was increased to 25 mg. ? ?April 2023: Patient was seen for expedited follow-up due to the detection of atrial fibrillation at his PCPs office.  He was started on Eliquis 5 mg twice daily.  His mexiletine was discontinued.  He was referred for cardioversion.  Additionally his losartan was started at 25 mg  every afternoon. ? ?Today: Patient was seen for expedited follow-up due to ongoing dyspnea.  CBC and CMP were drawn which were within normal limits.  The patient is here today with his wife.  He tells me he has had increasing dyspnea since I saw him last.  It was of insidious onset.  He does report wheezing at times.  He has had some problems with orthopnea but no peripheral edema.  He has had no chest pain.  He has not no presyncope or syncope.  He has had no severe bleeding or bruising including dark stools.  He has been completely compliant with his medications. ? ? ?Current Medications: ?Current Meds  ?Medication Sig  ? albuterol (VENTOLIN HFA) 108 (90 Base) MCG/ACT inhaler INHALE 2 PUFFS BY MOUTH EVERY 6 HOURS AS NEEDED FOR WHEEZING FOR SHORTNESS OF BREATH  ? apixaban (ELIQUIS) 5 MG TABS tablet Take 1 tablet (5 mg total) by mouth 2 (two) times daily.  ? atorvastatin (LIPITOR) 80 MG tablet TAKE 1 TABLET BY MOUTH ONCE DAILY AT  6  PM  ? carvedilol (COREG) 25 MG tablet Take 1 tablet (25 mg total) by mouth 2 (two) times daily.  ? clopidogrel (PLAVIX) 75 MG tablet Take 1 tablet by mouth once daily  ? diphenoxylate-atropine (LOMOTIL) 2.5-0.025 MG tablet Take 1 tablet by mouth 4 (four) times daily as needed for diarrhea or loose stools.  ? empagliflozin (JARDIANCE) 10 MG TABS tablet Take 1 tablet (10 mg total) by mouth daily before breakfast.  ? furosemide (LASIX) 20 MG tablet Take 1 tablet (20 mg total) by mouth as needed for fluid or edema.  ? losartan (COZAAR) 25 MG tablet Take 1 tablet (25 mg total) by mouth at bedtime.  ? meclizine (ANTIVERT) 25 MG tablet Take 1 tablet (25 mg total) by mouth 3 (three) times daily as needed for dizziness.  ? nilotinib (TASIGNA) 150 MG capsule TAKE 2 CAPSULES (300 MG TOTAL) BY MOUTH EVERY 12 (TWELVE) HOURS. TAKE ON AN EMPTY STOMACH, 1 HR BEFORE OR 2 HRS AFTER FOOD.  ? nitroGLYCERIN (NITROSTAT) 0.4 MG SL tablet Place 1 tablet (0.4 mg total) under the tongue every 5 (five) minutes as  needed for chest pain.  ? ondansetron (ZOFRAN) 4 MG tablet Take 1 tablet (4 mg total) by mouth every 8 (eight) hours as needed for nausea or vomiting.  ? sildenafil (VIAGRA) 100 MG tablet Take 0.5-1 tablets (50-100 mg total) by mouth daily as needed for erectile dysfunction.  ?  ? ?Allergies:    ?Breztri aerosphere [budeson-glycopyrrol-formoterol], Norvasc [amlodipine besylate], and Sulfa antibiotics  ? ?Social History:   ?Social History  ? ?Tobacco Use  ? Smoking status: Former  ?  Packs/day: 3.00  ?  Years: 45.00  ?  Pack years: 135.00  ?  Types: Cigarettes  ?  Quit date: 08/08/1997  ?  Years since quitting: 24.3  ?  Passive exposure: Past  ? Smokeless tobacco: Former  ?Vaping Use  ? Vaping Use: Never used  ?Substance Use Topics  ? Alcohol use: No  ?  Alcohol/week: 0.0 standard drinks  ?  Drug use: No  ?  ? ?Family Hx: ?Family History  ?Problem Relation Age of Onset  ? Heart disease Mother   ? Alcohol abuse Father   ?  ? ?Review of Systems:   ?Please see the history of present illness.    ?All other systems reviewed and are negative. ?  ? ? ?EKGs/Labs/Other Test Reviewed:   ? ?EKG:  EKG performed April 2023 that I personally reviewed demonstrates atrial fibrillation with right bundle branch block; EKG today which I personally reviewed demonstrates atrial fibrillation with a right bundle branch block and left anterior fascicular block. ? ?Prior CV studies: ? ?Carotid ultrasound 2023 ?1. Mildly elevated peak systolic and end-diastolic velocities in the ?right internal carotid artery indicative of 50-69% stenosis. This ?has increased since prior examination. ?2. Chronic occlusion of proximal left internal carotid artery. ? ?TTE 2023 ?Ejection fraction of 40 to 38% grade 1 diastolic dysfunction, severe akinesis of the basal mid inferoseptal and inferior wall, low normal right ventricular function and no significant valvular abnormalities ?  ?Lexiscan stress test 2018 ?Nuclear stress EF: 35%. ?Inferior/inferoseptal defect  consistent with scar No significant ischemia ?Intermediate risk study ?  ? ? ?Other studies Reviewed: ?Review of the additional studies/records demonstrates: Chest CT 2018 demonstrates aortic atherosc

## 2021-12-06 NOTE — H&P (Addendum)
?Date: 12/06/2021     ?     ?     ?Patient Name:  Anthony Wolfe MRN: [id number]  ?DOB: 1946-04-21 Age / Sex: 76 y.o., male   ?PCP: Anthony Frizzle, MD    ?     ?     ?Medical Service: Internal Medicine Teaching Service    ?     ?     ?Attending Physician: Dr. Heber Falling Wolfe, Anthony Moulds, DO    ?First Contact: Anthony Wolfe, MS3 Pager: [phone]  ?Second Contact: Rogelia Mire, MD Pager: [phone]  ?Third Contact Rick Duff, MD Pager: [phone]  ?     ?After Hours (After 5p/  First Contact Pager: [phone]  ?weekends / holidays): Second Contact Pager: [phone]  ? ?Chief Complaint: Shortness of Breath ? ?History of Present Illness: Anthony Wolfe is a 76 y.o. male with past medical history of CAD, CHF (last echo  10/27/2021 with EF 40-45%), HTN, TIA, NSTEMI with ICD placement, a-fib on Eliquis, COPD, CML who presents to the ED from his cardiologist's office for evaluation of shortness of breath.  ? ?He states that the shortness of breath started approximately 2 weeks ago and has been progressively worsening since that time. The patient was diagnosed with a-fib approximately 3 weeks ago and has been taking Eliquis since that time. He states that at his baseline he is typically able to walk at least 100 yards with little difficulty, but for the past 2 weeks has been short of breath when walking short distances around his house. He states that he has shortness of breath when lying flat and chooses to sleep on his stomach, but states he has been doing this for a long time, and this has not changed recently. He states that he was at a follow-up appointment with Dr Anthony Wolfe this morning who decided he should come to the ED for further workup of his shortness of breath. The patient does have history of COPD and states he has been wheezing and using his Albuterol inhaler 1-2 times per day over the past 2 weeks, but states that his inhaler does not improve his symptoms. Additionally, he takes Lasix PRN when he feels fluid  overloaded, and he decided to try Lasix 2 weeks ago but states this did not help his shortness of breath either.  ? ?In addition to his shortness of breath, the patient states that he has had decreased appetite for the past 2 weeks, reporting that he seems to get full more quickly than he used to. Despite eating less, the patient states that he has gained a few pounds over the past 2 weeks. He states that his weight is usually between 175 and 182 lbs.  ? ?Per chart review, the patient had recurrent right sided pleural effusions requiring thoracenteses in 2015 and 2016. His initial pleural effusion came in the setting of heart failure and pneumonia, and he stopped having right sided effusions after talc pleurodesis was performed. He had one left sided pleural effusion at that time but has not had any since. CT surgery states that the fluid was exudative with cytology showing reactive mesothelial cells.  ? ?ED Course: The patient presented with shortness of breath and was given Albuterol that did not improve his shortness of breath. His CXR showed mild bilateral pleural effusions as well as a rounded density in the lateral right lung base. CTA chest was negative for pulmonary embolus but did show a moderate left pleural effusion with lower lobe airspace opacification  concerning for atelectasis. There was also a 4 mm right upper lobe nodule and 12 mm ground-glass nodular lesion in the anterior right upper lobe that are new compared to prior. The patient will be admitted for shortness of breath and likely thoracentesis of his pleural effusion.  ? ?Meds:  ?Current Outpatient Medications  ?Medication Instructions  ? albuterol (VENTOLIN HFA) 108 (90 Base) MCG/ACT inhaler INHALE 2 PUFFS BY MOUTH EVERY 6 HOURS AS NEEDED FOR WHEEZING FOR SHORTNESS OF BREATH  ? apixaban (ELIQUIS) 5 mg, Oral, 2 times daily  ? atorvastatin (LIPITOR) 80 MG tablet TAKE 1 TABLET BY MOUTH ONCE DAILY AT  6  PM  ? carvedilol (COREG) 25 mg, Oral, 2  times daily  ? clopidogrel (PLAVIX) 75 MG tablet Take 1 tablet by mouth once daily  ? diphenoxylate-atropine (LOMOTIL) 2.5-0.025 MG tablet 1 tablet, Oral, 4 times daily PRN  ? empagliflozin (JARDIANCE) 10 mg, Oral, Daily before breakfast  ? furosemide (LASIX) 20 mg, Oral, As needed  ? losartan (COZAAR) 25 mg, Oral, Daily at bedtime  ? meclizine (ANTIVERT) 25 mg, Oral, 3 times daily PRN  ? nilotinib (TASIGNA) 150 MG capsule TAKE 2 CAPSULES (300 MG TOTAL) BY MOUTH EVERY 12 (TWELVE) HOURS. TAKE ON AN EMPTY STOMACH, 1 HR BEFORE OR 2 HRS AFTER FOOD.  ? nitroGLYCERIN (NITROSTAT) 0.4 mg, Sublingual, Every 5 min PRN  ? ondansetron (ZOFRAN) 4 mg, Oral, Every 8 hours PRN  ? sildenafil (VIAGRA) 50-100 mg, Oral, Daily PRN  ? ? ? ?Allergies: ?Allergies as of 12/06/2021 - Review Complete 12/06/2021  ?Allergen Reaction Noted  ? Breztri aerosphere [budeson-glycopyrrol-formoterol] Swelling 06/03/2021  ? Norvasc [amlodipine besylate] Rash 04/04/2013  ? Sulfa antibiotics Hives 05/03/2012  ? ?Past Medical History:  ?Diagnosis Date  ? AICD (automatic cardioverter/defibrillator) present   ? Allergic rhinitis   ? 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 Anxiety   ? Blood dyscrasia   ? cmleukemia  ? C. difficile colitis 06/16/2014  ? CAD (coronary artery disease)   ? a. s/p 3v CABG 2010. b. NSTEMI 05/2014 in setting of VT. Cath impressions: "Recent IMI with occluded SVG to RCA. Has Right to Right collaterals and collaterals from septal and OM. EF 40%."  ? Carotid disease, bilateral ([place])   ? a. Right CEA 2002; known occluded left carotid. b. Duplex 06/2014: known occluded LICA, stable 4-27% RICA s/p CEA.  ? Chronic systolic CHF (congestive heart failure) ([place])   ? CML (chronic myelocytic leukemia) ([place]) 05/09/2012  ? Depression   ? Emphysema of lung ([place])   ? HTN (hypertension)   ? Hyperlipidemia   ? Ischemic cardiomyopathy   ? a. Prior EF 36%. b. 2014: 50%. c. 05/2014: EF 35-40% by echo, 40% with inf HK by cath.  ? NSTEMI (non-ST elevation  myocardial infarction) ([place]) 05/2014  ? Pleural effusion 05/2014  ? Pneumonia 05/23/2014  ? Presence of permanent cardiac pacemaker   ? Prostate hypertrophy   ? PVC's (premature ventricular contractions)   ? a. PVC's with prior Holter showing PVC load of 22%.  ? Shortness of breath dyspnea   ? Stroke [facility]) 01  ? TIA (transient ischemic attack)   ? ASPVD, S./P. right CEA, 2002, and known occluded left carotid  ? Ventricular tachycardia ([place])   ? a. Admitted with VT 05/2013 - EPS with inducible sustained monomorphic VT; s/p [employer] ICD 05/21/14.  ? ? ?Family History: The patient's father passed away due to complications of alcoholism. The patient's mother died of esophageal cancer. The  patient has 4 sisters who he states are healthy. The patient's son has CAD requiring stent placement.  ? ?Social History: The patient lives at home with his wife. He previously worked for [employer] and has been retired for 15 years. He has no difficulty getting or taking his medications, which are managed by his wife. He has a 50 pack year history but has not smoked cigarettes for 25 years. No alcohol use. No illicit drug use. The patient has no difficulty and requires no assistance for ADLs.  ? ?Review of Systems: ?A complete ROS was negative except as per HPI.  ? ?Physical Exam: ?Blood pressure (!) 149/68, pulse 86, temperature 97.7 ?F (36.5 ?C), temperature source Oral, resp. rate (!) 22, height '5\' 9"'$  (1.753 m), weight 82.6 kg, SpO2 91 %. ?Constitutional: Appears well-developed and well-nourished. No distress.  ?HENT: Normocephalic and atraumatic, EOMI, conjunctiva normal, moist mucous membranes ?Cardiovascular: Normal rate, regular rhythm, S1 and S2 present, no murmurs, rubs, gallops.  Distal pulses intact. ?Respiratory: Effort is normal on room air. Patient has worsened dyspnea with lying flat. Lungs with diminished breath sounds on the left, diffuse soft expiratory wheezes throughout. ?GI: Soft. Non-distended. Non-tender. No  appreciable organomegaly.  ?Musculoskeletal: Normal bulk and tone.  No peripheral edema noted. ?Neurological: Alert and oriented x4, no apparent focal deficits noted. ?Skin: Warm and dry.  No rash, erythema, lesi

## 2021-12-07 ENCOUNTER — Observation Stay (HOSPITAL_COMMUNITY): Payer: Medicare HMO

## 2021-12-07 ENCOUNTER — Encounter: Payer: Self-pay | Admitting: Student

## 2021-12-07 ENCOUNTER — Encounter (HOSPITAL_COMMUNITY): Payer: Self-pay | Admitting: Internal Medicine

## 2021-12-07 ENCOUNTER — Other Ambulatory Visit: Payer: Self-pay | Admitting: Family Medicine

## 2021-12-07 DIAGNOSIS — I4819 Other persistent atrial fibrillation: Secondary | ICD-10-CM

## 2021-12-07 DIAGNOSIS — I4891 Unspecified atrial fibrillation: Secondary | ICD-10-CM | POA: Diagnosis present

## 2021-12-07 DIAGNOSIS — I5020 Unspecified systolic (congestive) heart failure: Secondary | ICD-10-CM | POA: Diagnosis present

## 2021-12-07 DIAGNOSIS — Z48813 Encounter for surgical aftercare following surgery on the respiratory system: Secondary | ICD-10-CM | POA: Diagnosis not present

## 2021-12-07 DIAGNOSIS — J90 Pleural effusion, not elsewhere classified: Secondary | ICD-10-CM

## 2021-12-07 DIAGNOSIS — I214 Non-ST elevation (NSTEMI) myocardial infarction: Secondary | ICD-10-CM | POA: Diagnosis not present

## 2021-12-07 DIAGNOSIS — R911 Solitary pulmonary nodule: Secondary | ICD-10-CM | POA: Diagnosis present

## 2021-12-07 DIAGNOSIS — R846 Abnormal cytological findings in specimens from respiratory organs and thorax: Secondary | ICD-10-CM | POA: Diagnosis not present

## 2021-12-07 DIAGNOSIS — I509 Heart failure, unspecified: Secondary | ICD-10-CM | POA: Diagnosis not present

## 2021-12-07 DIAGNOSIS — J9811 Atelectasis: Secondary | ICD-10-CM | POA: Diagnosis not present

## 2021-12-07 HISTORY — PX: IR THORACENTESIS ASP PLEURAL SPACE W/IMG GUIDE: IMG5380

## 2021-12-07 LAB — COMPREHENSIVE METABOLIC PANEL
ALT: 14 U/L (ref 0–44)
AST: 13 U/L — ABNORMAL LOW (ref 15–41)
Albumin: 2.4 g/dL — ABNORMAL LOW (ref 3.5–5.0)
Alkaline Phosphatase: 83 U/L (ref 38–126)
Anion gap: 9 (ref 5–15)
BUN: 12 mg/dL (ref 8–23)
CO2: 21 mmol/L — ABNORMAL LOW (ref 22–32)
Calcium: 8.2 mg/dL — ABNORMAL LOW (ref 8.9–10.3)
Chloride: 111 mmol/L (ref 98–111)
Creatinine, Ser: 1.09 mg/dL (ref 0.61–1.24)
GFR, Estimated: >60 mL/min (ref >60)
Glucose, Bld: 83 mg/dL (ref 70–99)
Potassium: 4.1 mmol/L (ref 3.5–5.1)
Sodium: 141 mmol/L (ref 135–145)
Total Bilirubin: 0.5 mg/dL (ref 0.3–1.2)
Total Protein: 5.7 g/dL — ABNORMAL LOW (ref 6.5–8.1)

## 2021-12-07 LAB — BODY FLUID CELL COUNT WITH DIFFERENTIAL
Eos, Fluid: 1 %
Lymphs, Fluid: 71 %
Monocyte-Macrophage-Serous Fluid: 24 % — ABNORMAL LOW (ref 50–90)
Neutrophil Count, Fluid: 2 % (ref 0–25)
Other Cells, Fluid: 2 %
Total Nucleated Cell Count, Fluid: 890 cu mm (ref 0–1000)

## 2021-12-07 LAB — CBC
HCT: 28.9 % — ABNORMAL LOW (ref 39.0–52.0)
Hemoglobin: 9.2 g/dL — ABNORMAL LOW (ref 13.0–17.0)
MCH: 30.1 pg (ref 26.0–34.0)
MCHC: 31.8 g/dL (ref 30.0–36.0)
MCV: 94.4 fL (ref 80.0–100.0)
Platelets: 238 10*3/uL (ref 150–400)
RBC: 3.06 MIL/uL — ABNORMAL LOW (ref 4.22–5.81)
RDW: 15 % (ref 11.5–15.5)
WBC: 7.4 10*3/uL (ref 4.0–10.5)
nRBC: 0 % (ref 0.0–0.2)

## 2021-12-07 LAB — PROTEIN, PLEURAL OR PERITONEAL FLUID: Total protein, fluid: <3 g/dL

## 2021-12-07 LAB — ALBUMIN, PLEURAL OR PERITONEAL FLUID: Albumin, Fluid: <1.5 g/dL

## 2021-12-07 LAB — LACTATE DEHYDROGENASE, PLEURAL OR PERITONEAL FLUID: LD, Fluid: 153 U/L — ABNORMAL HIGH (ref 3–23)

## 2021-12-07 LAB — AMYLASE, PLEURAL OR PERITONEAL FLUID: Amylase, Fluid: 13 U/L

## 2021-12-07 MED ORDER — FUROSEMIDE 20 MG PO TABS
40.0000 mg | ORAL_TABLET | Freq: Every day | ORAL | Status: DC
  Administered 2021-12-07: 40 mg via ORAL
  Filled 2021-12-07: qty 2

## 2021-12-07 MED ORDER — LIDOCAINE HCL 1 % IJ SOLN
INTRAMUSCULAR | Status: AC
  Filled 2021-12-07: qty 20

## 2021-12-07 MED ORDER — APIXABAN 5 MG PO TABS: 5.0000 mg | ORAL_TABLET | Freq: Two times a day (BID) | ORAL | Status: DC

## 2021-12-07 NOTE — Discharge Summary (Addendum)
? ?Name: Anthony Wolfe ?MRN: [id number] ?DOB: [birth year] 76 y.o. ?PCP: [name], MD ? ?Date of Admission: 12/06/2021  9:27 AM ?Date of Discharge:   12/07/2021 ?Attending Physician: Lucious Groves, DO ? ?Discharge Diagnosis: ?1. Shortness of breath, left pleural effusion ?2. Atrial Fibrillation ?3. Chronic systolic CHF ?4. CAD, s/p NSTEMI with stent placement ?5. COPD ?6. CML ? ?Discharge Medications: ?Allergies as of 12/07/2021   ? ?   Reactions  ? Breztri Aerosphere [budeson-glycopyrrol-formoterol] Swelling  ? Swelling of mouth tongue   ? Norvasc [amlodipine Besylate] Rash  ? rash  ? Sulfa Antibiotics Hives  ? Hives  ? ?  ? ?  ?Medication List  ?  ? ?STOP taking these medications   ? ?nitroGLYCERIN 0.4 MG SL tablet ?Commonly known as: NITROSTAT ?  ?ondansetron 4 MG tablet ?Commonly known as: ZOFRAN ?  ? ?  ? ?TAKE these medications   ? ?albuterol 108 (90 Base) MCG/ACT inhaler ?Commonly known as: VENTOLIN HFA ?INHALE 2 PUFFS BY MOUTH EVERY 6 HOURS AS NEEDED FOR WHEEZING FOR SHORTNESS OF BREATH ?  ?apixaban 5 MG Tabs tablet ?Commonly known as: ELIQUIS ?Take 1 tablet (5 mg total) by mouth 2 (two) times daily. ?  ?atorvastatin 80 MG tablet ?Commonly known as: LIPITOR ?TAKE 1 TABLET BY MOUTH ONCE DAILY AT  6  PM ?  ?carvedilol 25 MG tablet ?Commonly known as: COREG ?Take 1 tablet (25 mg total) by mouth 2 (two) times daily. ?  ?clopidogrel 75 MG tablet ?Commonly known as: PLAVIX ?Take 1 tablet by mouth once daily ?  ?diphenoxylate-atropine 2.5-0.025 MG tablet ?Commonly known as: LOMOTIL ?Take 1 tablet by mouth 4 (four) times daily as needed for diarrhea or loose stools. ?  ?empagliflozin 10 MG Tabs tablet ?Commonly known as: Jardiance ?Take 1 tablet (10 mg total) by mouth daily before breakfast. ?  ?furosemide 20 MG tablet ?Commonly known as: LASIX ?Take 1 tablet (20 mg total) by mouth as needed for fluid or edema. ?  ?losartan 25 MG tablet ?Commonly known as: COZAAR ?Take 1 tablet (25 mg total) by mouth at  bedtime. ?  ?meclizine 25 MG tablet ?Commonly known as: ANTIVERT ?Take 1 tablet (25 mg total) by mouth 3 (three) times daily as needed for dizziness. ?  ?nilotinib 150 MG capsule ?Commonly known as: Tasigna ?TAKE 2 CAPSULES (300 MG TOTAL) BY MOUTH EVERY 12 (TWELVE) HOURS. TAKE ON AN EMPTY STOMACH, 1 HR BEFORE OR 2 HRS AFTER FOOD. ?  ?sildenafil 100 MG tablet ?Commonly known as: Viagra ?Take 0.5-1 tablets (50-100 mg total) by mouth daily as needed for erectile dysfunction. ?  ? ?  ? ? ?Disposition and follow-up:   ?Mr.[name] was discharged from [facility] in Stable condition.  At the hospital follow up visit please address: ? ?1.  Pleural effusion ? ?2.  Labs / imaging needed at time of follow-up: none ? ?3.  Pending labs/ test needing follow-up: pleural fluid cytology ? ?Follow-up Appointments: ? ? ?Hospital Course by problem list: ?1. Dyspnea, left pleural effusion ?Patient presented with shortness of breath for the past 2 weeks. He states that he previously was able to walk greater than 100 yards with little difficulty, but recently he has had shortness of breath walking short distances around his house. On exam, the patient has decreased breath sounds on the left, and he has increased dyspnea when lying flat. He has tried Lasix and Albuterol inhalers over the past 2 weeks with no relief.  CXR and CT chest showed no PE but did show left sided pleural effusion. 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 The patient underwent thoracentesis on the day of discharge. Per IR note, they performed successful US guided diagnostic and therapeutic left thoracentesis that yielded 850 cc of cloudy, dark yellow fluid. The fluid has been sent for labs. Patient will follow up with his cardiologist in 2 weeks to go over these results.. Unclear the exact etiology of the effusion this time. Patient also had an elevated BNP and could have  had heart failure exacerbation though this would likely be bilateral and the fluid more clear. Also considered his current immunotherapeutic nilotinib though the incidence of pleural effusions is minimal and he has been on this for sometime. Nevertheless, the patient states his shortness of breath is resolved, and he is ready to go home. The patient had a CXR done following thoracentesis that showed decreased left pleural effusion with no pneumothorax. He has follow up with oncology on 5/18 ? ?2. Atrial Fibrillation ?The patient was diagnosed with a-fib approximately 2 weeks ago by Dr Ali Lowe with cardiology. He was started on Eliquis and is scheduled for cardioversion on 12/20/2021. Per cardiology note this morning, they state the patient is not eligible for cardioversion given that he has missed 2 doses of his Eliquis during theis admission for his thoracentesis. Per their note, they will follow up with the patient in 2 weeks and plan for cardioversion 3 weeks after Eliquis has been restarted.  ? ?3. Chronic CHF 2/2 ICM  ?Patient with HFmrEF (last echo 10/27/2021 with EF 40-45%) on Carvedilol, Empagliflozin, PRN Lasix for this. He had no lower extremity edema. Though his pleural effusion could be related to his heart failure. He was discharged on his home dose of oral lasix.  ? ? ?4. CAD, s/p CABG, multiple PCIs w/ stent placement and redo CABG ?The patient has longstanding CAD he currently takes Atorvastatin, Clopidogrel, and coreg for this. He has had no chest pain during this admission, and his EKG, troponin showed no signs of acute ischemia. Continue outpatient medications.  ? ?5. COPD ?The patient has longstanding COPD with 50 pack year smoking history, and he uses Albuterol inhalers as needed for wheezing and shortness of breath. He reported wheezing over the past 2 weeks, but none was appreciated during this admission. Continue Albuterol prn for wheezing at home. ? ?6. CML ?Patient with CML currently taking  Nilotinib for this followed by Dr Alvy Bimler. Patient's CBC was unchanged from his baseline. Continue Nilotinib at home.  ? ?Discharge Exam:   ?BP 113/74   Pulse 85   Temp 97.7 ?F (36.5 ?C) (Oral)   Resp (!) 21   Ht '5\' 9"'$  (1.753 m)   Wt 82.6 kg   SpO2 94%   BMI 26.88 kg/m?  ? ?Constitutional: well appearing and sitting in bed, in no acute distress ?HENT: normocephalic atraumatic, mucous membranes moist ?Eyes: pupils equal and round, conjunctiva non-erythematous ?Cardiovascular: regular rate with normal rhythm, no m/r/g ?Pulmonary/Chest: normal work of breathing on room air, lungs clear to auscultation bilaterally ?Abdominal: soft, non-tender, non-distended, bowel sounds present ?MSK: normal bulk and tone, 5/5 strength in bilateral upper and lower extremities.  ?Skin: warm and dry. ?Neurological: alert and answering questions appropriately. ?Psych: appropriate mood and affect  ? ?Pertinent Labs, Studies, and Procedures:  ? ?DG Chest 1 View ? ?Result Date: 12/07/2021 ?CLINICAL DATA:  Status post left-sided thoracentesis. EXAM: CHEST  1  VIEW COMPARISON:  CT chest and chest x-ray from yesterday. FINDINGS: Unchanged left chest wall pacemaker. Stable cardiomegaly status post CABG. Decreased now trace left pleural effusion with improved blunting of the left costophrenic angle. Mild residual left basilar atelectasis. Unchanged small right pleural effusion with loculated fluid in the right major fissure. No pneumothorax. No acute osseous abnormality. IMPRESSION: 1. Decreased now trace left pleural effusion status post thoracentesis. No pneumothorax. Electronically Signed   By: Titus Dubin M.D.   On: 12/07/2021 09:01  ? ?DG Chest 2 View ? ?Result Date: 12/06/2021 ?CLINICAL DATA:  Shortness of breath. EXAM: CHEST - 2 VIEW COMPARISON:  August 06, 2021. FINDINGS: Stable cardiomegaly. Left-sided defibrillator is unchanged. Status post coronary bypass graft. Mild bilateral pleural effusions are noted which are increased  compared to prior exam. Mild bibasilar subsegmental atelectasis or scarring is noted. Increased rounded density is seen laterally in the right lung base which may simply represent loculated pleural effusion, but mass ca

## 2021-12-07 NOTE — Progress Notes (Addendum)
? ?Progress Note ? ?Patient Name: Anthony Wolfe ?Date of Encounter: 12/07/2021 ? ?[facility] Cardiologist: Early Osmond, MD  ? ?Subjective  ? ?Patient denies chest pain, palpitations, dizziness. Has SOB on exertion, orthopnea. Has thoracentesis scheduled for today  ? ?Inpatient Medications  ?  ?Scheduled Meds: ? atorvastatin  80 mg Oral Daily  ? carvedilol  25 mg Oral BID  ? clopidogrel  75 mg Oral Daily  ? losartan  25 mg Oral QHS  ? nilotinib  300 mg Oral Q12H  ? ?Continuous Infusions: ? ?PRN Meds: ?acetaminophen **OR** acetaminophen, ipratropium-albuterol, ondansetron **OR** ondansetron (ZOFRAN) IV  ? ?Vital Signs  ?  ?Vitals:  ? 12/06/21 2308 12/07/21 0241 12/07/21 0415 12/07/21 0644  ?BP: 137/80 111/87 132/67 125/77  ?Pulse: 92 87 88 86  ?Resp: (!) '27 15 16 '$ (!) 22  ?Temp:      ?TempSrc:      ?SpO2: 95% 90% 93% 92%  ?Weight:      ?Height:      ? ? ?Intake/Output Summary (Last 24 hours) at 12/07/2021 0736 ?Last data filed at 12/06/2021 1336 ?Gross per 24 hour  ?Intake --  ?Output 300 ml  ?Net -300 ml  ? ? ?  12/06/2021  ?  9:36 AM 12/06/2021  ?  8:50 AM 11/19/2021  ?  9:25 AM  ?Last 3 Weights  ?Weight (lbs) 182 lb 182 lb 181 lb  ?Weight (kg) 82.555 kg 82.555 kg 82.101 kg  ?   ? ?Telemetry  ?  ?Atrial fibrillation, HR in the 80s - Personally Reviewed ? ?ECG  ?  ?Atrial Fibrillation, LAD, RBBB, HR 94 - Personally Reviewed ? ?Physical Exam  ? ?GEN: No acute distress.  Sitting comfortably in the bed eating breakfast  ?Neck: No JVD ?Cardiac: Irregular rate and rhythm. Radial pulses 2+ bilaterally   ?Respiratory: Decreased breath sounds in bilateral lung bases, worse on left. Expiratory wheezes  ?GI: Soft, nontender, non-distended  ?MS: No edema; No deformity. ?Neuro:  Nonfocal  ?Psych: Normal affect  ? ?Labs  ?  ?High Sensitivity Troponin:   ?Recent Labs  ?Lab 12/06/21 ?9833 12/06/21 ?1134  ?TROPONINIHS 19* 15  ?   ?Chemistry ?Recent Labs  ?Lab 12/06/21 ?8250 12/07/21 ?0530  ?NA 141 141  ?K 3.9 4.1  ?CL 109 111  ?CO2 24  21*  ?GLUCOSE 121* 83  ?BUN 10 12  ?CREATININE 1.06 1.09  ?CALCIUM 8.5* 8.2*  ?PROT  --  5.7*  ?ALBUMIN  --  2.4*  ?AST  --  13*  ?ALT  --  14  ?ALKPHOS  --  83  ?BILITOT  --  0.5  ?GFRNONAA >60 >60  ?ANIONGAP 8 9  ?  ?Lipids No results for input(s): CHOL, TRIG, HDL, LABVLDL, LDLCALC, CHOLHDL in the last 168 hours.  ?Hematology ?Recent Labs  ?Lab 12/06/21 ?5397 12/07/21 ?0530  ?WBC 9.4 7.4  ?RBC 3.48* 3.06*  ?HGB 10.6* 9.2*  ?HCT 32.7* 28.9*  ?MCV 94.0 94.4  ?MCH 30.5 30.1  ?MCHC 32.4 31.8  ?RDW 14.9 15.0  ?PLT 243 238  ? ?Thyroid No results for input(s): TSH, FREET4 in the last 168 hours.  ?BNP ?Recent Labs  ?Lab 12/06/21 ?0947  ?BNP 719.7*  ?  ?DDimer No results for input(s): DDIMER in the last 168 hours.  ? ?Radiology  ?  ?DG Chest 2 View ? ?Result Date: 12/06/2021 ?CLINICAL DATA:  Shortness of breath. EXAM: CHEST - 2 VIEW COMPARISON:  August 06, 2021. FINDINGS: Stable cardiomegaly. Left-sided defibrillator is unchanged.  Status post coronary bypass graft. Mild bilateral pleural effusions are noted which are increased compared to prior exam. Mild bibasilar subsegmental atelectasis or scarring is noted. Increased rounded density is seen laterally in the right lung base which may simply represent loculated pleural effusion, but mass cannot be excluded. Bony thorax is unremarkable. IMPRESSION: Mild bilateral pleural effusions are noted which are increased compared to prior exam. Mild bibasilar subsegmental atelectasis or scarring is noted. Increased rounded density is seen laterally in right lung base which may represent loculated pleural effusion, but mass or neoplasm cannot be excluded. CT scan of the chest is recommended for further evaluation. Electronically Signed   By: Marijo Conception M.D.   On: 12/06/2021 11:19  ? ?CT Angio Chest PE W and/or Wo Contrast ? ?Result Date: 12/06/2021 ?CLINICAL DATA:  Pulmonary embolism suspected. EXAM: CT ANGIOGRAPHY CHEST WITH CONTRAST TECHNIQUE: Multidetector CT imaging of the  chest was performed using the standard protocol during bolus administration of intravenous contrast. Multiplanar CT image reconstructions and MIPs were obtained to evaluate the vascular anatomy. RADIATION DOSE REDUCTION: This exam was performed according to the departmental dose-optimization program which includes automated exposure control, adjustment of the mA and/or kV according to patient size and/or use of iterative reconstruction technique. CONTRAST:  167m OMNIPAQUE IOHEXOL 350 MG/ML SOLN COMPARISON:  04/29/2017. FINDINGS: Cardiovascular: Negative for pulmonary embolus. Atherosclerotic calcification of the aorta. Heart is enlarged. No pericardial effusion. Mediastinum/Nodes: Mediastinal lymph nodes measure up to 11 mm in the low right paratracheal station, similar to 04/29/2017. No hilar or axillary adenopathy. Esophagus is unremarkable. Lungs/Pleura: Centrilobular emphysema. 4 mm apical segment right upper lobe nodule (6/37) and 12 mm ground-glass nodular lesion in the anterior right upper lobe (6/47), new. 4 mm nodule in the anterior segment right upper lobe (6/73), unchanged and benign. Volume loss in the lingula. Dependent airspace opacification in the left lower lobe, adjacent to a moderate left pleural effusion. Highly loculated moderate right pleural effusion with evidence of talc pleurodesis. Upper Abdomen: Visualized portions of the liver, gallbladder and right adrenal gland are unremarkable. Nodular thickening of the left adrenal gland, unchanged. No follow-up necessary. 8.7 cm fluid density mass off the upper pole right kidney, similar and likely a cyst. No follow-up necessary. Visualized portions of the left kidney, spleen, pancreas, stomach and bowel are unremarkable. Calcified upper abdominal lymph nodes. Musculoskeletal: Degenerative changes in the spine. No worrisome lytic or sclerotic lesions. Review of the MIP images confirms the above findings. IMPRESSION: 1. Negative for pulmonary embolus.  2. Moderate left pleural effusion with left lower lobe airspace opacification, possibly due to atelectasis. Difficult to exclude developing pneumonia. 3. 4 mm right upper lobe nodule and 12 mm ground-glass nodular lesion in the anterior right upper lobe are new. Initial follow-up with CT at 6 months is recommended to confirm persistence. If persistent, repeat CT is recommended every 2 years until 5 years of stability has been established. This recommendation follows the consensus statement: Guidelines for Management of Incidental Pulmonary Nodules Detected on CT Images: From the Fleischner Society 2017; Radiology 2017; 284:228-243. 4. Highly loculated right pleural effusion with evidence of talc pleurodesis. 5.  Aortic atherosclerosis (ICD10-I70.0). 6.  Emphysema (ICD10-J43.9). Electronically Signed   By: MLorin PicketM.D.   On: 12/06/2021 12:59   ? ?Cardiac Studies  ? ?Echocardiogram 10/27/21  ?1. Left ventricular ejection fraction, by estimation, is 40 to 45%. Left  ?ventricular ejection fraction by 3D volume is 42 %. The left ventricle has  ?mildly decreased function.  The left ventricle demonstrates regional wall  ?motion abnormalities (see  ?scoring diagram/findings for description). There is moderate left  ?ventricular hypertrophy. Left ventricular diastolic parameters are  ?consistent with Grade I diastolic dysfunction (impaired relaxation). There  ?is severe akinesis of the left ventricular,  ?basal-mid inferoseptal wall and inferior wall.  ? 2. Right ventricular systolic function is low normal. The right  ?ventricular size is normal.  ? 3. Left atrial size was moderately dilated.  ? 4. The mitral valve is grossly normal. Mild mitral valve regurgitation.  ? 5. The aortic valve is tricuspid. Aortic valve regurgitation is not  ?visualized.  ? 6. The pulmonic valve was abnormal.  ? 7. Aortic dilatation noted. There is borderline dilatation of the aortic  ?root, measuring 38 mm.  ? 8. The inferior vena cava is  normal in size with greater than 50%  ?respiratory variability, suggesting right atrial pressure of 3 mmHg.  ? ?Comparison(s): No significant change from prior study. 05/28/2020: LVEF  ?40-45%, global

## 2021-12-07 NOTE — ED Notes (Signed)
Pt requesting to take [place] off for awhile, pt understands O2 is 88-92% RA. ?

## 2021-12-07 NOTE — ED Notes (Signed)
Transported to IR for thoracentesis.  ?

## 2021-12-07 NOTE — Procedures (Signed)
PROCEDURE SUMMARY: ? ?Successful US guided diagnostic and therapeutic left thoracentesis. ?Yielded 850 cc of cloudy, dark yellow fluid. ?Pt tolerated procedure well. ?No immediate complications. ? ?Specimen was sent for labs. ?CXR ordered. ? ?EBL < 1 mL ? ?Tyson Alias, AGNP ?12/07/2021 ?8:48 AM ? ?   ?

## 2021-12-07 NOTE — Progress Notes (Deleted)
? ?Subjective: ? ?Overnight Events: No acute events or concerns overnight. ? ?The patient states that after returning from thoracentesis he has no shortness of breath and he feels back to his baseline and ready to go home. He has no increased shortness of breath with laying flat and states that he was able to walk to the bathroom and back without shortness of breath. No chest pain or abdominal pain. ? ?Objective: ? ?Vital signs in last 24 hours: ?Vitals:  ? 12/06/21 2308 12/07/21 0241 12/07/21 0415 12/07/21 0644  ?BP: 137/80 111/87 132/67 125/77  ?Pulse: 92 87 88 86  ?Resp: (!) '27 15 16 '$ (!) 22  ?Temp:      ?TempSrc:      ?SpO2: 95% 90% 93% 92%  ?Weight:      ?Height:      ? ?Weight change:  ? ?Intake/Output Summary (Last 24 hours) at 12/07/2021 0734 ?Last data filed at 12/06/2021 1336 ?Gross per 24 hour  ?Intake --  ?Output 300 ml  ?Net -300 ml  ? ? ?Physical Exam ?  ?Constitutional: well appearing and sitting in bed, in no acute distress ?HENT: normocephalic atraumatic, mucous membranes moist ?Eyes: pupils equal and round, conjunctiva non-erythematous ?Cardiovascular: regular rate with normal rhythm, no m/r/g ?Pulmonary/Chest: normal work of breathing on room air, lungs clear to auscultation bilaterally ?Abdominal: soft, non-tender, non-distended, bowel sounds present ?MSK: normal bulk and tone, 5/5 strength in bilateral upper and lower extremities.  ?Skin: warm and dry. ?Neurological: alert and answering questions appropriately. ?Psych: appropriate mood and affect  ? ?Assessment/Plan: ? ?Principal Problem: ?  Dyspnea ? ?Anthony Wolfe is a 76 y.o. male with past medical history of CAD, CHF (last echo  10/27/2021 with EF 40-45%), HTN, TIA, NSTEMI with ICD placement, a-fib on Eliquis, COPD, CML who presented to the ED from his cardiologist's office for evaluation of shortness of breath.  ?  ?# Shortness of Breath ?# Left Pleural Effusion ?Patient presented with shortness of breath for the past 2 weeks. He states that he  previously was able to walk greater than 100 yards with little difficulty, but now he has shortness of breath walking short distances around his house. On exam, the patient has decreased breath sounds on the left, and he has increased dyspnea when lying flat. He has tried Lasix and Albuterol inhalers over the past 2 weeks with no relief. CXR and CT chest showed no PE but did show left sided pleural effusions. 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 The patient underwent thoracentesis this morning. Per IR note, they performed successful US guided diagnostic and therapeutic left thoracentesis that yielded 850 cc of cloudy, dark yellow fluid. The fluid has been sent for labs. The patient states his shortness of breath is resolved, and he is ready to go home. The patient had a CXR done following thoracentesis that shows decreased left pleural effusion with no pneumothorax. ?- Follow-up TSH ?- Follow-up magnesium ?  ?# Atrial Fibrillation ?The patient was diagnosed with a-fib approximately 2 weeks ago by Dr Ali Lowe with cardiology. He was started on Eliquis and is planning for cardioversion on 12/20/2021. Per cardiology note this morning, they state that they may attempt to complete cardioversion during this hospital admission.  ?- Eliquis was held for thoracentesis ?- Awaiting cardiology decision on cardioversion today versus later this month outpatient ?  ?# Congestive Heart Failure ?Patient with HFrEF (last echo 10/27/2021 with EF 40-45%) on Carvedilol, Empagliflozin, PRN Lasix for this.  He appears euvolemic at this time with no lower extremity edema. He took a few doses of Lasix 2 weeks ago when his shortness of breath began, but this did not give him any relief.  ?- Continue Carvedilol ?- Lasix prn for fluid overload ?- Monitor fluid intake/output ?  ?# Coronary Artery Disease ?# Previous NSTEMI s/p stent placement ?The patient has  longstanding CAD with history of NSTEMI requiring stent placement, and he currently takes Atorvastatin, Clopidogrel for this. He states that he has had no chest pain today, and his EKG, troponin show no signs of acute ischemia.  ?- Continue Atorvastatin, Clopidogrel ?  ?# Chronic Obstructive Pulmonary Disease ?The patient has longstanding COPD with 50 pack year smoking history, and he uses Albuterol inhalers as needed for wheezing and shortness of breath. He states that he has noticed some wheezing and has tried to use his Albuterol inhalers, but has not noticed any improvement of his shortness of breath. ?- Albuterol inhalers prn for wheezing and shortness of breath ?  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 Patient's CBC is unchanged from his baseline. ?- Continue Nilotinib ?  ?Diet: Regular ?VTE: Holding Eliquis given recent thoracentesis ?IVF: NS ?Code: Full ?  ?Prior to Admission Living Arrangement: Home ?Anticipated Discharge Location: Home ?Barriers to Discharge: Cardiology considering cardioversion this admission  ?Dispo: Anticipated discharge in approximately 1-2 day(s).  ? ? LOS: 0 days  ? ?Elane Fritz, Medical Student ?12/07/2021, 7:34 AM  ?

## 2021-12-07 NOTE — ED Notes (Signed)
Placed breakfast order ?

## 2021-12-08 LAB — CYTOLOGY - NON PAP

## 2021-12-10 ENCOUNTER — Ambulatory Visit
Admission: RE | Admit: 2021-12-10 | Discharge: 2021-12-10 | Disposition: A | Discharge status: Home / Self Care | Payer: Medicare HMO | Source: Ambulatory Visit | Attending: Family Medicine | Admitting: Family Medicine

## 2021-12-10 ENCOUNTER — Telehealth: Payer: Self-pay

## 2021-12-10 ENCOUNTER — Other Ambulatory Visit: Payer: Self-pay | Admitting: Family Medicine

## 2021-12-10 DIAGNOSIS — R0600 Dyspnea, unspecified: Secondary | ICD-10-CM

## 2021-12-10 DIAGNOSIS — J90 Pleural effusion, not elsewhere classified: Secondary | ICD-10-CM | POA: Diagnosis not present

## 2021-12-10 DIAGNOSIS — R0602 Shortness of breath: Secondary | ICD-10-CM | POA: Diagnosis not present

## 2021-12-10 LAB — BODY FLUID CULTURE W GRAM STAIN: Culture: NO GROWTH

## 2021-12-10 NOTE — Telephone Encounter (Signed)
Anthony Frizzle, MD sent to [name], [name]; [name], [place] ?I want patient to go get CXR, I have ordered this at [address]. Please call him and ask him to go get this.  ? ?12/10/21 spoke with pt re: CXR per Dr. Dennard Schaumann. Pt aware and voiced understanding.  ?

## 2021-12-10 NOTE — Telephone Encounter (Signed)
I want patient to go get CXR, I have ordered this at [address]. Please call him and ask him to go get this.  ?

## 2021-12-13 ENCOUNTER — Other Ambulatory Visit: Payer: Self-pay

## 2021-12-13 ENCOUNTER — Inpatient Hospital Stay: Payer: Medicare HMO | Attending: Hematology and Oncology

## 2021-12-13 DIAGNOSIS — I4891 Unspecified atrial fibrillation: Secondary | ICD-10-CM | POA: Insufficient documentation

## 2021-12-13 DIAGNOSIS — C9210 Chronic myeloid leukemia, BCR/ABL-positive, not having achieved remission: Secondary | ICD-10-CM | POA: Insufficient documentation

## 2021-12-13 DIAGNOSIS — Z79899 Other long term (current) drug therapy: Secondary | ICD-10-CM | POA: Insufficient documentation

## 2021-12-13 DIAGNOSIS — D6481 Anemia due to antineoplastic chemotherapy: Secondary | ICD-10-CM | POA: Insufficient documentation

## 2021-12-13 DIAGNOSIS — Z7901 Long term (current) use of anticoagulants: Secondary | ICD-10-CM | POA: Insufficient documentation

## 2021-12-13 DIAGNOSIS — I509 Heart failure, unspecified: Secondary | ICD-10-CM | POA: Diagnosis not present

## 2021-12-13 LAB — CBC WITH DIFFERENTIAL/PLATELET
Abs Immature Granulocytes: 0.03 10*3/uL (ref 0.00–0.07)
Basophils Absolute: 0.1 10*3/uL (ref 0.0–0.1)
Basophils Relative: 2 %
Eosinophils Absolute: 0.2 10*3/uL (ref 0.0–0.5)
Eosinophils Relative: 2 %
HCT: 31.7 % — ABNORMAL LOW (ref 39.0–52.0)
Hemoglobin: 10.1 g/dL — ABNORMAL LOW (ref 13.0–17.0)
Immature Granulocytes: 0 %
Lymphocytes Relative: 13 %
Lymphs Abs: 1.1 10*3/uL (ref 0.7–4.0)
MCH: 29.4 pg (ref 26.0–34.0)
MCHC: 31.9 g/dL (ref 30.0–36.0)
MCV: 92.4 fL (ref 80.0–100.0)
Monocytes Absolute: 0.7 10*3/uL (ref 0.1–1.0)
Monocytes Relative: 8 %
Neutro Abs: 6.1 10*3/uL (ref 1.7–7.7)
Neutrophils Relative %: 75 %
Platelets: 280 10*3/uL (ref 150–400)
RBC: 3.43 MIL/uL — ABNORMAL LOW (ref 4.22–5.81)
RDW: 15.1 % (ref 11.5–15.5)
WBC: 8.2 10*3/uL (ref 4.0–10.5)
nRBC: 0 % (ref 0.0–0.2)

## 2021-12-13 LAB — COMPREHENSIVE METABOLIC PANEL
ALT: 10 U/L (ref 0–44)
AST: 11 U/L — ABNORMAL LOW (ref 15–41)
Albumin: 3.3 g/dL — ABNORMAL LOW (ref 3.5–5.0)
Alkaline Phosphatase: 86 U/L (ref 38–126)
Anion gap: 6 (ref 5–15)
BUN: 12 mg/dL (ref 8–23)
CO2: 31 mmol/L (ref 22–32)
Calcium: 8.6 mg/dL — ABNORMAL LOW (ref 8.9–10.3)
Chloride: 108 mmol/L (ref 98–111)
Creatinine, Ser: 1.15 mg/dL (ref 0.61–1.24)
GFR, Estimated: >60 mL/min (ref >60)
Glucose, Bld: 113 mg/dL — ABNORMAL HIGH (ref 70–99)
Potassium: 3.6 mmol/L (ref 3.5–5.1)
Sodium: 145 mmol/L (ref 135–145)
Total Bilirubin: 0.7 mg/dL (ref 0.3–1.2)
Total Protein: 6.7 g/dL (ref 6.5–8.1)

## 2021-12-15 NOTE — Progress Notes (Signed)
Remote ICD transmission.   

## 2021-12-17 LAB — BCR/ABL

## 2021-12-18 LAB — MISC LABCORP TEST (SEND OUT): Labcorp test code: 9985

## 2021-12-23 ENCOUNTER — Other Ambulatory Visit: Payer: Self-pay

## 2021-12-23 ENCOUNTER — Inpatient Hospital Stay: Payer: Medicare HMO | Admitting: Hematology and Oncology

## 2021-12-23 DIAGNOSIS — T451X5A Adverse effect of antineoplastic and immunosuppressive drugs, initial encounter: Secondary | ICD-10-CM | POA: Diagnosis not present

## 2021-12-23 DIAGNOSIS — C9210 Chronic myeloid leukemia, BCR/ABL-positive, not having achieved remission: Secondary | ICD-10-CM | POA: Diagnosis not present

## 2021-12-23 DIAGNOSIS — D6481 Anemia due to antineoplastic chemotherapy: Secondary | ICD-10-CM

## 2021-12-23 DIAGNOSIS — Z79899 Other long term (current) drug therapy: Secondary | ICD-10-CM | POA: Diagnosis not present

## 2021-12-23 DIAGNOSIS — I4891 Unspecified atrial fibrillation: Secondary | ICD-10-CM | POA: Diagnosis not present

## 2021-12-23 DIAGNOSIS — Z7901 Long term (current) use of anticoagulants: Secondary | ICD-10-CM | POA: Diagnosis not present

## 2021-12-23 DIAGNOSIS — I509 Heart failure, unspecified: Secondary | ICD-10-CM | POA: Diagnosis not present

## 2021-12-24 ENCOUNTER — Encounter: Payer: Self-pay | Admitting: Hematology and Oncology

## 2021-12-24 ENCOUNTER — Telehealth: Payer: Self-pay | Admitting: *Deleted

## 2021-12-24 ENCOUNTER — Encounter (HOSPITAL_COMMUNITY): Payer: Self-pay | Admitting: Internal Medicine

## 2021-12-24 NOTE — Assessment & Plan Note (Signed)
The patient has peroxisomal atrial fibrillation, with occasional signs of congestive heart failure He is on medical management with plan for possible cardioversion He has close follow-up with his cardiologist Overall, I do not believe this is the direct correlation or side effects from nilotinib He has been on an alternate for very long time and he has pre-existing cardiac condition before that He will continue treatment as directed by his cardiologist

## 2021-12-24 NOTE — Telephone Encounter (Signed)
Received message from pre surgery testing. They called patient regarding his upcoming DCCV on 5/30. Patient states he was told and has on paper instructions his procedure is 5/24. Pre surgery testing is asking office to contact patient to clarify. Patient is scheduled for appointment in afib clinic on 5/24 and DCCV on 5/30.  I spoke with patient and gave him this information.  Parking information and phone number for afib clinic provided to patient.

## 2021-12-24 NOTE — Assessment & Plan Note (Signed)
He tolerated treatment well without major side effects He is in major molecular response He will continue treatment indefinitely I plan to see him again in 3 months for further follow-up

## 2021-12-24 NOTE — Progress Notes (Signed)
[facility] OFFICE PROGRESS NOTE  Patient Care Team: [name], MD as PCP - General (Family Medicine) Early Osmond, MD as PCP - Cardiology (Cardiology) [name]., MD as Attending Physician (Cardiology) [name], MD (Family Medicine) [name], MD (Cardiothoracic Surgery) Early, Arvilla Meres, MD as Attending Physician (Vascular Surgery) [name], MD as Consulting Physician (Hematology and Oncology) Deboraha Sprang, MD as Consulting Physician (Cardiology) [name], [facility] as Pharmacist (Pharmacist) [name], [facility] as Pharmacist (Pharmacist)  ASSESSMENT & PLAN:  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 He is in major molecular response He will continue treatment indefinitely I plan to see him again in 3 months for further follow-up  Atrial fibrillation [facility]) The patient has peroxisomal atrial fibrillation, with occasional signs of congestive heart failure He is on medical management with plan for possible cardioversion He has close follow-up with his cardiologist Overall, I do not believe this is the direct correlation or side effects from nilotinib He has been on an alternate for very long time and he has pre-existing cardiac condition before that He will continue treatment as directed by his cardiologist  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 The patient denies recent history of bleeding such as epistaxis, hematuria or hematochezia. He is asymptomatic from the anemia. We will observe for now.  No orders of the defined types were placed in this encounter.   All questions were answered. The patient knows to call the clinic with any problems, questions or concerns. The total time spent in the appointment was 20 minutes encounter with patients including review of chart and various tests results, discussions about plan of care and  coordination of care plan   [name], MD 12/24/2021 9:25 AM  INTERVAL HISTORY: Please see below for problem oriented charting. he returns for treatment follow-up on nilotinib for CML He is here accompanied by his wife He is compliant taking medications as directed He had recent hospitalization due to shortness of breath He also had  atrial fibrillation on medical management and anticoagulation therapy He has shortness of breath on exertion No recent cough Denies recent infection  REVIEW OF SYSTEMS:   Constitutional: Denies fevers, chills or abnormal weight loss Eyes: Denies blurriness of vision Ears, nose, mouth, throat, and face: Denies mucositis or sore throat Respiratory: Denies cough, dyspnea or wheezes Cardiovascular: Denies palpitation, chest discomfort or lower extremity swelling Gastrointestinal:  Denies nausea, heartburn or change in bowel habits Skin: Denies abnormal skin rashes Lymphatics: Denies new lymphadenopathy or easy bruising Neurological:Denies numbness, tingling or new weaknesses Behavioral/Psych: Mood is stable, no new changes  All other systems were reviewed with the patient and are negative.  I have reviewed the past medical history, past surgical history, social history and family history with the patient and they are unchanged from previous note.  ALLERGIES:  is allergic to breztri aerosphere [budeson-glycopyrrol-formoterol], norvasc [amlodipine besylate], and sulfa antibiotics.  MEDICATIONS:  Current Outpatient Medications  Medication Sig Dispense Refill   albuterol (VENTOLIN HFA) 108 (90 Base) MCG/ACT inhaler INHALE 2 PUFFS BY MOUTH EVERY 6 HOURS AS NEEDED FOR WHEEZING FOR SHORTNESS OF BREATH 18 g 11   apixaban (ELIQUIS) 5 MG TABS tablet Take 1 tablet (5 mg total) by mouth 2 (two) times daily. 60 tablet 6   atorvastatin (LIPITOR) 80 MG tablet TAKE 1 TABLET BY MOUTH ONCE DAILY AT  6  PM 90 tablet 3  carvedilol (COREG) 25 MG tablet Take 1 tablet (25  mg total) by mouth 2 (two) times daily. 180 tablet 3   clopidogrel (PLAVIX) 75 MG tablet Take 1 tablet by mouth once daily 90 tablet 3   diphenoxylate-atropine (LOMOTIL) 2.5-0.025 MG tablet Take 1 tablet by mouth 4 (four) times daily as needed for diarrhea or loose stools. 30 tablet 0   empagliflozin (JARDIANCE) 10 MG TABS tablet Take 1 tablet (10 mg total) by mouth daily before breakfast. 30 tablet 11   furosemide (LASIX) 20 MG tablet Take 1 tablet (20 mg total) by mouth as needed for fluid or edema. 90 tablet 1   losartan (COZAAR) 25 MG tablet Take 1 tablet (25 mg total) by mouth at bedtime. 90 tablet 3   meclizine (ANTIVERT) 25 MG tablet Take 1 tablet (25 mg total) by mouth 3 (three) times daily as needed for dizziness. 30 tablet 0   nilotinib (TASIGNA) 150 MG capsule TAKE 2 CAPSULES (300 MG TOTAL) BY MOUTH EVERY 12 (TWELVE) HOURS. TAKE ON AN EMPTY STOMACH, 1 HR BEFORE OR 2 HRS AFTER FOOD. 112 capsule 12   sildenafil (VIAGRA) 100 MG tablet Take 0.5-1 tablets (50-100 mg total) by mouth daily as needed for erectile dysfunction. 5 tablet 11   No current facility-administered medications for this visit.    SUMMARY OF ONCOLOGIC HISTORY: Oncology History  Chronic myelogenous leukemia ([place])  05/04/2012 Bone Marrow Biopsy   BM confirmed diagnosis of CML in Chronic phase. BCR/ABL by PCR detected abnormalitis with b2a2 & b3a2 subtypes    05/09/2012 - 03/16/2015 Chemotherapy   He was started on treatment with Dasatinib 100 mg daily    06/06/2012 Adverse Reaction   Dose of medication was reduced to 50 mg daily due to pancytopenia    01/24/2013 Progression   Patient was noted to have elevated blood count which and was subsequently found to be noncompliant to treatment. The patient has not been on treatment for several months because his prescription ran out. He was restarted back on treatment    01/16/2014 Progression   Bcr/ABL by PCR is worse. Dose of Dasatinib was increased to 100 mg.     02/26/2014 Tumor Marker   Blood work for ABL kinase mutation was negative.    10/29/2014 Tumor Marker   BCR/ABL b2a2 & b3a2 0.29%, IS 0.1624%, not in MMR yet but improving    11/05/2014 Adverse Reaction   He had thoracentesis due to pleural effusion    11/27/2014 Surgery   He underwent right video-assisted thoracoscopy, Drainage of pleural effusion, Pleural biopsy, Diaphragm biopsy, Lung biopsy & Talc pleurodesis    01/30/2015 Tumor Marker   BCR/ABL b2a2 & b3a2 0.78%, IS 0.4368%, not in MMR     03/11/2015 Pathology Results   BCR/ABL b2a2 0.19%, IS 0.1064%, not in MMR     03/16/2015 Adverse Reaction   Dasatinib was stopped due to recurrent pleural effusion    03/28/2015 - 11/06/2017 Chemotherapy   He started on Bosutinib    03/30/2015 Procedure   He has therapeutic thoracentesis of the right lung with 1 liter of fluid removed    03/30/2015 Adverse Reaction   Bosutinib is placed on hold, to be restart on 8/25 at 250 mg due to severe diarrhea    04/28/2015 Tumor Marker   BCR/ABL b2a2 0.06%, IS 0.0336%, in MMR     06/10/2015 Tumor Marker   BCR/ABL b2a2 0.14%, IS 0.1162%, not in MMR     08/14/2015 Pathology Results   BCR/ABL b2a2  0.02%, IS 0.0166%, In MMR     11/09/2015 Tumor Marker   BCR/ABL b2a2 0.005%, IS 0.0042%, In MMR     02/08/2016 Tumor Marker   BCR/ABL undetectable. In MMR   06/13/2016 Tumor Marker   BCR/ABL undetectable. In MMR   12/06/2016 Pathology Results   BCR/ABL undetectable. In MMR   06/08/2017 Pathology Results   BCR/ABL undetectable. In MMR   09/08/2017 Pathology Results   BCR/ABL undetectable. In MMR   12/11/2017 Pathology Results   BCR/ABL undetectable. In MMR   03/19/2018 Pathology Results   BCR/ABL undetectable. In MMR   06/12/2018 Pathology Results   BCR/ABL undetectable. In MMR   09/06/2018 Pathology Results   BCR ABL is detectable. e13a2 (b2a2) and [id number]) IS: 0.009%    09/26/2018 -  Chemotherapy   The patient had Tasigna     11/21/2018  Pathology Results   BCR/ABL undetectable. In MMR   03/07/2019 Pathology Results   BCR/ABL undetectable. In MMR   06/17/2019 Pathology Results   BCR/ABL undetectable. In MMR   10/14/2019 Pathology Results   BCR ABL is detectable. e13a2 (b2a2) and I09B3(Z3G9) IS: 0.0281%. In MMR   01/14/2020 Pathology Results   BCR ABL is detectable. e13a2 (b2a2) and J24Q6(S3M1) IS: 0.023%. In MMR   10/23/2020 Pathology Results   BCR ABL is detectable. e13a2 (b2a2) and D62I2(L7L8) IS: 0.0643%. In MMR   01/22/2021 Pathology Results   BCR ABL is detectable. e13a2 (b2a2) and X21J9(E1D4) IS: 0.027%. In MMR   05/25/2021 Pathology Results   BCR ABL is detectable. e13a2 (b2a2) and Y81K4(Y1E5) IS: 0.0091%. In MMR   08/25/2021 Pathology Results   BCR ABL is detectable. e13a2 (b2a2) and U31S9(F0Y6) IS: 0.0039%. In MMR   12/13/2021 Pathology Results   BCR/ABL undetectable. In MMR     PHYSICAL EXAMINATION: ECOG PERFORMANCE STATUS: 1 - Symptomatic but completely ambulatory  Vitals:   12/23/21 0845  BP: 135/68  Pulse: 78  Resp: 18  Temp: (!) 97.4 F (36.3 C)  SpO2: 100%   Filed Weights   12/23/21 0845  Weight: 168 lb (76.2 kg)    GENERAL:alert, no distress and comfortable SKIN: skin color, texture, turgor are normal, no rashes or significant lesions EYES: normal, Conjunctiva are pink and non-injected, sclera clear OROPHARYNX:no exudate, no erythema and lips, buccal mucosa, and tongue normal  NECK: supple, thyroid normal size, non-tender, without nodularity LYMPH:  no palpable lymphadenopathy in the cervical, axillary or inguinal LUNGS: clear to auscultation and percussion with normal breathing effort HEART: regular rate & rhythm and no murmurs and no lower extremity edema ABDOMEN:abdomen soft, non-tender and normal bowel sounds Musculoskeletal:no cyanosis of digits and no clubbing  NEURO: alert & oriented x 3 with fluent speech, no focal motor/sensory deficits  LABORATORY DATA:  I have reviewed the  data as listed    Component Value Date/Time   NA 145 12/13/2021 0829   NA 143 11/29/2021 1510   NA 142 06/08/2017 0832   K 3.6 12/13/2021 0829   K 3.9 06/08/2017 0832   CL 108 12/13/2021 0829   CL 110 (H) 11/14/2012 0918   CO2 31 12/13/2021 0829   CO2 25 06/08/2017 0832   GLUCOSE 113 (H) 12/13/2021 0829   GLUCOSE 98 06/08/2017 0832   GLUCOSE 108 (H) 11/14/2012 0918   BUN 12 12/13/2021 0829   BUN 16 11/29/2021 1510   BUN 11.6 06/08/2017 0832   CREATININE 1.15 12/13/2021 0829   CREATININE 1.13 01/22/2021 0840   CREATININE 0.90 05/31/2019 0837   CREATININE 0.9  06/08/2017 0832   CALCIUM 8.6 (L) 12/13/2021 0829   CALCIUM 8.6 06/08/2017 0832   PROT 6.7 12/13/2021 0829   PROT 5.8 (L) 11/29/2021 1510   PROT 6.7 06/08/2017 0832   ALBUMIN 3.3 (L) 12/13/2021 0829   ALBUMIN 4.1 11/29/2021 1510   ALBUMIN 3.4 (L) 06/08/2017 0832   AST 11 (L) 12/13/2021 0829   AST 13 (L) 01/22/2021 0840   AST 13 06/08/2017 0832   ALT 10 12/13/2021 0829   ALT 14 01/22/2021 0840   ALT 7 06/08/2017 0832   ALKPHOS 86 12/13/2021 0829   ALKPHOS 96 06/08/2017 0832   BILITOT 0.7 12/13/2021 0829   BILITOT 0.5 11/29/2021 1510   BILITOT 0.3 01/22/2021 0840   BILITOT 0.44 06/08/2017 0832   GFRNONAA >60 12/13/2021 0829   GFRNONAA >60 01/22/2021 0840   GFRNONAA 83 05/31/2019 0837   GFRAA >60 04/24/2020 0842   GFRAA 96 05/31/2019 0837    No results found for: SPEP, UPEP  Lab Results  Component Value Date   WBC 8.2 12/13/2021   NEUTROABS 6.1 12/13/2021   HGB 10.1 (L) 12/13/2021   HCT 31.7 (L) 12/13/2021   MCV 92.4 12/13/2021   PLT 280 12/13/2021      Chemistry      Component Value Date/Time   NA 145 12/13/2021 0829   NA 143 11/29/2021 1510   NA 142 06/08/2017 0832   K 3.6 12/13/2021 0829   K 3.9 06/08/2017 0832   CL 108 12/13/2021 0829   CL 110 (H) 11/14/2012 0918   CO2 31 12/13/2021 0829   CO2 25 06/08/2017 0832   BUN 12 12/13/2021 0829   BUN 16 11/29/2021 1510   BUN 11.6 06/08/2017 0832    CREATININE 1.15 12/13/2021 0829   CREATININE 1.13 01/22/2021 0840   CREATININE 0.90 05/31/2019 0837   CREATININE 0.9 06/08/2017 0832      Component Value Date/Time   CALCIUM 8.6 (L) 12/13/2021 0829   CALCIUM 8.6 06/08/2017 0832   ALKPHOS 86 12/13/2021 0829   ALKPHOS 96 06/08/2017 0832   AST 11 (L) 12/13/2021 0829   AST 13 (L) 01/22/2021 0840   AST 13 06/08/2017 0832   ALT 10 12/13/2021 0829   ALT 14 01/22/2021 0840   ALT 7 06/08/2017 0832   BILITOT 0.7 12/13/2021 0829   BILITOT 0.5 11/29/2021 1510   BILITOT 0.3 01/22/2021 0840   BILITOT 0.44 06/08/2017 0832       RADIOGRAPHIC STUDIES: I have personally reviewed the radiological images as listed and agreed with the findings in the report. DG Chest 1 View  Result Date: 12/07/2021 CLINICAL DATA:  Status post left-sided thoracentesis. EXAM: CHEST  1 VIEW COMPARISON:  CT chest and chest x-ray from yesterday. FINDINGS: Unchanged left chest wall pacemaker. Stable cardiomegaly status post CABG. Decreased now trace left pleural effusion with improved blunting of the left costophrenic angle. Mild residual left basilar atelectasis. Unchanged small right pleural effusion with loculated fluid in the right major fissure. No pneumothorax. No acute osseous abnormality. IMPRESSION: 1. Decreased now trace left pleural effusion status post thoracentesis. No pneumothorax. Electronically Signed   By: Titus Dubin M.D.   On: 12/07/2021 09:01   DG Chest 2 View  Result Date: 12/11/2021 CLINICAL DATA:  Shortness of breath. EXAM: CHEST - 2 VIEW COMPARISON:  Chest x-ray 12/07/2021.  Chest CT 12/06/2021. FINDINGS: Rounded density in the right lower lung corresponds to loculated pleural effusion, unchanged. Small layering right pleural effusion is also unchanged. Right middle lobe atelectasis is  unchanged. No new focal lung infiltrate or pneumothorax. Cardiomediastinal silhouette is stable, the heart is mildly enlarged. Patient is status post cardiac surgery.  Left-sided pacemaker is present. No acute fractures are seen. IMPRESSION: 1. Stable appearance of small right pleural effusion, partially loculated. 2. Stable right middle lobe atelectasis. Electronically Signed   By: Ronney Asters M.D.   On: 12/11/2021 03:06   DG Chest 2 View  Result Date: 12/06/2021 CLINICAL DATA:  Shortness of breath. EXAM: CHEST - 2 VIEW COMPARISON:  August 06, 2021. FINDINGS: Stable cardiomegaly. Left-sided defibrillator is unchanged. Status post coronary bypass graft. Mild bilateral pleural effusions are noted which are increased compared to prior exam. Mild bibasilar subsegmental atelectasis or scarring is noted. Increased rounded density is seen laterally in the right lung base which may simply represent loculated pleural effusion, but mass cannot be excluded. Bony thorax is unremarkable. IMPRESSION: Mild bilateral pleural effusions are noted which are increased compared to prior exam. Mild bibasilar subsegmental atelectasis or scarring is noted. Increased rounded density is seen laterally in right lung base which may represent loculated pleural effusion, but mass or neoplasm cannot be excluded. CT scan of the chest is recommended for further evaluation. Electronically Signed   By: Marijo Conception M.D.   On: 12/06/2021 11:19   CT Angio Chest PE W and/or Wo Contrast  Result Date: 12/06/2021 CLINICAL DATA:  Pulmonary embolism suspected. EXAM: CT ANGIOGRAPHY CHEST WITH CONTRAST TECHNIQUE: Multidetector CT imaging of the chest was performed using the standard protocol during bolus administration of intravenous contrast. Multiplanar CT image reconstructions and MIPs were obtained to evaluate the vascular anatomy. RADIATION DOSE REDUCTION: This exam was performed according to the departmental dose-optimization program which includes automated exposure control, adjustment of the mA and/or kV according to patient size and/or use of iterative reconstruction technique. CONTRAST:  135m  OMNIPAQUE IOHEXOL 350 MG/ML SOLN COMPARISON:  04/29/2017. FINDINGS: Cardiovascular: Negative for pulmonary embolus. Atherosclerotic calcification of the aorta. Heart is enlarged. No pericardial effusion. Mediastinum/Nodes: Mediastinal lymph nodes measure up to 11 mm in the low right paratracheal station, similar to 04/29/2017. No hilar or axillary adenopathy. Esophagus is unremarkable. Lungs/Pleura: Centrilobular emphysema. 4 mm apical segment right upper lobe nodule (6/37) and 12 mm ground-glass nodular lesion in the anterior right upper lobe (6/47), new. 4 mm nodule in the anterior segment right upper lobe (6/73), unchanged and benign. Volume loss in the lingula. Dependent airspace opacification in the left lower lobe, adjacent to a moderate left pleural effusion. Highly loculated moderate right pleural effusion with evidence of talc pleurodesis. Upper Abdomen: Visualized portions of the liver, gallbladder and right adrenal gland are unremarkable. Nodular thickening of the left adrenal gland, unchanged. No follow-up necessary. 8.7 cm fluid density mass off the upper pole right kidney, similar and likely a cyst. No follow-up necessary. Visualized portions of the left kidney, spleen, pancreas, stomach and bowel are unremarkable. Calcified upper abdominal lymph nodes. Musculoskeletal: Degenerative changes in the spine. No worrisome lytic or sclerotic lesions. Review of the MIP images confirms the above findings. IMPRESSION: 1. Negative for pulmonary embolus. 2. Moderate left pleural effusion with left lower lobe airspace opacification, possibly due to atelectasis. Difficult to exclude developing pneumonia. 3. 4 mm right upper lobe nodule and 12 mm ground-glass nodular lesion in the anterior right upper lobe are new. Initial follow-up with CT at 6 months is recommended to confirm persistence. If persistent, repeat CT is recommended every 2 years until 5 years of stability has been established. This recommendation  follows the consensus statement: Guidelines for Management of Incidental Pulmonary Nodules Detected on CT Images: From the Fleischner Society 2017; Radiology 2017; 284:228-243. 4. Highly loculated right pleural effusion with evidence of talc pleurodesis. 5.  Aortic atherosclerosis (ICD10-I70.0). 6.  Emphysema (ICD10-J43.9). Electronically Signed   By: Lorin Picket M.D.   On: 12/06/2021 12:59   CUP PACEART REMOTE DEVICE CHECK  Result Date: 11/30/2021 Scheduled remote reviewed. Normal device function.  Next remote 91 days. LA  IR THORACENTESIS ASP PLEURAL SPACE W/IMG GUIDE  Result Date: 12/07/2021 INDICATION: History of CHF, CAD, NSTEMI, COPD, recurrent left pleural effusion. Request for diagnostic and therapeutic left thoracentesis. EXAM: ULTRASOUND GUIDED DIAGNOSTIC AND THERAPEUTIC LEFT THORACENTESIS MEDICATIONS: 10 mL 1 % lidocaine COMPLICATIONS: None immediate. PROCEDURE: An ultrasound guided thoracentesis was thoroughly discussed with the patient and questions answered. The benefits, risks, alternatives and complications were also discussed. The patient understands and wishes to proceed with the procedure. Written consent was obtained. Ultrasound was performed to localize and mark an adequate pocket of fluid in the left chest. The area was then prepped and draped in the normal sterile fashion. 1% Lidocaine was used for local anesthesia. Under ultrasound guidance a 6 Fr Safe-T-Centesis catheter was introduced. Thoracentesis was performed. The catheter was removed and a dressing applied. FINDINGS: A total of approximately 850 cc of cloudy, dark yellow fluid was removed. Samples were sent to the laboratory as requested by the clinical team. IMPRESSION: Successful ultrasound guided left thoracentesis yielding 850 cc of pleural fluid. Read by: Narda Rutherford, AGNP-BC Electronically Signed   By: Corrie Mckusick D.O.   On: 12/07/2021 10:13

## 2021-12-24 NOTE — Assessment & Plan Note (Signed)
This is likely anemia of chronic disease. The patient denies recent history of bleeding such as epistaxis, hematuria or hematochezia. He is asymptomatic from the anemia. We will observe for now.  

## 2021-12-27 ENCOUNTER — Other Ambulatory Visit: Payer: Self-pay | Admitting: Internal Medicine

## 2021-12-29 ENCOUNTER — Ambulatory Visit (HOSPITAL_COMMUNITY)
Admit: 2021-12-29 | Discharge: 2021-12-29 | Disposition: A | Discharge status: Home / Self Care | Payer: Medicare HMO | Attending: Nurse Practitioner | Admitting: Nurse Practitioner

## 2021-12-29 ENCOUNTER — Encounter (HOSPITAL_COMMUNITY): Payer: Self-pay | Admitting: Nurse Practitioner

## 2021-12-29 VITALS — BP 106/56 | HR 77 | Ht 69.0 in | Wt 173.2 lb

## 2021-12-29 DIAGNOSIS — I4892 Unspecified atrial flutter: Secondary | ICD-10-CM | POA: Diagnosis not present

## 2021-12-29 DIAGNOSIS — I4891 Unspecified atrial fibrillation: Secondary | ICD-10-CM | POA: Insufficient documentation

## 2021-12-29 DIAGNOSIS — I4819 Other persistent atrial fibrillation: Secondary | ICD-10-CM

## 2021-12-29 DIAGNOSIS — Z7901 Long term (current) use of anticoagulants: Secondary | ICD-10-CM | POA: Insufficient documentation

## 2021-12-29 DIAGNOSIS — Z79899 Other long term (current) drug therapy: Secondary | ICD-10-CM | POA: Insufficient documentation

## 2021-12-29 DIAGNOSIS — I509 Heart failure, unspecified: Secondary | ICD-10-CM | POA: Insufficient documentation

## 2021-12-29 DIAGNOSIS — D6869 Other thrombophilia: Secondary | ICD-10-CM

## 2021-12-29 DIAGNOSIS — Z955 Presence of coronary angioplasty implant and graft: Secondary | ICD-10-CM | POA: Diagnosis not present

## 2021-12-29 DIAGNOSIS — I2510 Atherosclerotic heart disease of native coronary artery without angina pectoris: Secondary | ICD-10-CM | POA: Diagnosis not present

## 2021-12-29 DIAGNOSIS — I110 Hypertensive heart disease with heart failure: Secondary | ICD-10-CM | POA: Insufficient documentation

## 2021-12-29 DIAGNOSIS — I493 Ventricular premature depolarization: Secondary | ICD-10-CM | POA: Diagnosis not present

## 2021-12-29 DIAGNOSIS — I252 Old myocardial infarction: Secondary | ICD-10-CM | POA: Diagnosis not present

## 2021-12-29 DIAGNOSIS — Z8673 Personal history of transient ischemic attack (TIA), and cerebral infarction without residual deficits: Secondary | ICD-10-CM | POA: Insufficient documentation

## 2021-12-29 LAB — CBC
HCT: 30.8 % — ABNORMAL LOW (ref 39.0–52.0)
Hemoglobin: 9.5 g/dL — ABNORMAL LOW (ref 13.0–17.0)
MCH: 29 pg (ref 26.0–34.0)
MCHC: 30.8 g/dL (ref 30.0–36.0)
MCV: 93.9 fL (ref 80.0–100.0)
Platelets: 214 10*3/uL (ref 150–400)
RBC: 3.28 MIL/uL — ABNORMAL LOW (ref 4.22–5.81)
RDW: 15.1 % (ref 11.5–15.5)
WBC: 6.8 10*3/uL (ref 4.0–10.5)
nRBC: 0 % (ref 0.0–0.2)

## 2021-12-29 LAB — BASIC METABOLIC PANEL
Anion gap: 4 — ABNORMAL LOW (ref 5–15)
BUN: 11 mg/dL (ref 8–23)
CO2: 27 mmol/L (ref 22–32)
Calcium: 8.6 mg/dL — ABNORMAL LOW (ref 8.9–10.3)
Chloride: 111 mmol/L (ref 98–111)
Creatinine, Ser: 1.15 mg/dL (ref 0.61–1.24)
GFR, Estimated: >60 mL/min (ref >60)
Glucose, Bld: 96 mg/dL (ref 70–99)
Potassium: 3.7 mmol/L (ref 3.5–5.1)
Sodium: 142 mmol/L (ref 135–145)

## 2021-12-29 NOTE — Patient Instructions (Addendum)
Cardioversion scheduled for Tuesday, May 30th  - Arrive at the [facility] and go to admitting at 930am  - Do not eat or drink anything after midnight the night prior to your procedure.  - Take all your morning medication (except diabetic medications) with a sip of water prior to arrival.  - You will not be able to drive home after your procedure.  - Do NOT miss any doses of your blood thinner - if you should miss a dose please notify our office immediately.  - If you feel as if you go back into normal rhythm prior to scheduled cardioversion, please notify our office immediately. If your procedure is canceled in the cardioversion suite you will be charged a cancellation fee.

## 2021-12-29 NOTE — H&P (View-Only) (Signed)
Primary Care Physician: [name], MD Referring Physician: Dr. Ernest Mallick Anthony Wolfe is a 76 y.o. male with a h/o CAD, s/p CABG, Carotid disease, HTN, TIA, NSTEMI, HF, that saw Dr. Ali Lowe 4/14  for  new onset afib and started on eliquis and coreg. He saw  pt back 5/1 with pt c/o of increasing shortness of breath. He was sent to the ER and had a large  L pleural effusion and  had thoracentesis that yielded 850 cc for cloudy dark yellow fluid. H/o of pleural effusions in 2016 requiring thoracentesis.  He had to miss 2 doses of eliquis for the lung tap  so cardioversion was delayed. He is now in the afib clinic to be evaluated prior to scheduled cardioversion 5/30.   He states that he has not missed any anticoagulation for the last month. He has been cramping in his legs since being discharged from the hospital on 40 mg lasix. His wife started him on OTC K+, but he is still cramping. His breathing is improved since the thoracentesis but he is not back to baseline with his breathing and has noted fatigue since dx with afib.     Today, he denies symptoms of palpitations, chest pain, shortness of breath, orthopnea, PND, lower extremity edema, dizziness, presyncope, syncope, or neurologic sequela. The patient is tolerating medications without difficulties and is otherwise without complaint today.   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 difficile colitis 06/16/2014   CAD (coronary artery disease)    a. s/p 3v CABG 2010. b. NSTEMI 05/2014 in setting of VT. Cath impressions: "Recent IMI with occluded SVG to RCA. Has Right to Right collaterals and collaterals from septal and OM. EF 40%."   Carotid disease, bilateral ([place])    a. Right CEA 2002; known occluded left carotid. b. Duplex 06/2014: known occluded LICA, stable 5-27%  RICA s/p CEA.   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 Prior EF 36%. b. 2014: 50%. c. 05/2014: EF 35-40% by echo, 40% with inf HK by cath.   NSTEMI (non-ST elevation myocardial infarction) ([place]) 05/2014   Pleural effusion 05/2014   Pneumonia 05/23/2014   Presence of permanent cardiac pacemaker    Prostate hypertrophy    PVC's (premature ventricular contractions)    a. PVC's with prior Holter showing PVC load of 22%.   Shortness of breath dyspnea    Stroke ([place]) 01   TIA (transient ischemic attack)    ASPVD, S./P. right CEA, 2002, and known occluded left carotid   Ventricular tachycardia ([place])    a. Admitted with VT 05/2013 - EPS with inducible sustained monomorphic VT; s/p Medtronic ICD 05/21/14.   Past Surgical History:  Procedure Laterality Date   BACK SURGERY     CAROTID ENDARTERECTOMY Right 01   carotidectomy  2003   right side   COLONOSCOPY  07/2010   CORONARY ARTERY BYPASS GRAFT  02/2009   3 vessel   ELECTROPHYSIOLOGIC STUDY  05/21/14   ELECTROPHYSIOLOGY STUDY N/A 05/21/2014   Procedure: ELECTROPHYSIOLOGY STUDY;  Surgeon: Deboraha Sprang, MD;  Location: [facility]  CATH LAB;  Service: Cardiovascular;  Laterality: N/A;   EP IMPLANTABLE DEVICE  05/21/14   single chamber Metronic ICD   IR THORACENTESIS ASP PLEURAL SPACE W/IMG GUIDE  12/07/2021   LEFT HEART CATHETERIZATION WITH CORONARY/GRAFT ANGIOGRAM N/A 05/19/2014   Procedure: LEFT HEART CATHETERIZATION WITH Beatrix Fetters;  Surgeon: Josue Hector, MD;  Location: [facility] CATH LAB;  Service: Cardiovascular;  Laterality: N/A;   PLEURAL BIOPSY N/A 11/27/2014   Procedure: PLEURAL BIOPSY;  Surgeon: [name], MD;  Location: [place];  Service: Thoracic;  Laterality: N/A;   PLEURAL EFFUSION DRAINAGE Right 11/27/2014   Procedure: DRAINAGE OF PLEURAL  EFFUSION;  Surgeon: [name], MD;  Location: [place];  Service: Thoracic;  Laterality: Right;   TALC PLEURODESIS Right 11/27/2014   Procedure: Pietro Cassis;  Surgeon: [name], MD;  Location: [place];  Service: Thoracic;  Laterality: Right;   VIDEO ASSISTED THORACOSCOPY Right 11/27/2014   Procedure: RIGHT VIDEO ASSISTED THORACOSCOPY;  Surgeon: [name], MD;  Location: [place];  Service: Thoracic;  Laterality: Right;    Current Outpatient Medications  Medication Sig Dispense Refill   albuterol (VENTOLIN HFA) 108 (90 Base) MCG/ACT inhaler INHALE 2 PUFFS BY MOUTH EVERY 6 HOURS AS NEEDED FOR WHEEZING FOR SHORTNESS OF BREATH 18 g 11   apixaban (ELIQUIS) 5 MG TABS tablet Take 1 tablet (5 mg total) by mouth 2 (two) times daily. 60 tablet 6   atorvastatin (LIPITOR) 80 MG tablet TAKE 1 TABLET BY MOUTH ONCE DAILY AT  6  PM 90 tablet 3   carvedilol (COREG) 25 MG tablet Take 1 tablet (25 mg total) by mouth 2 (two) times daily. 180 tablet 3   clopidogrel (PLAVIX) 75 MG tablet Take 1 tablet by mouth once daily 90 tablet 3   diphenoxylate-atropine (LOMOTIL) 2.5-0.025 MG tablet Take 1 tablet by mouth 4 (four) times daily as needed for diarrhea or loose stools. 30 tablet 0   empagliflozin (JARDIANCE) 10 MG TABS tablet Take 1 tablet (10 mg total) by mouth daily before breakfast. 30 tablet 11   furosemide (LASIX) 20 MG tablet Take 1 tablet (20 mg total) by mouth as needed for fluid or edema. (Patient taking differently: Take 40 mg by mouth daily.) 90 tablet 1   losartan (COZAAR) 25 MG tablet Take 1 tablet (25 mg total) by mouth at bedtime. 90 tablet 3   meclizine (ANTIVERT) 25 MG tablet Take 1 tablet (25 mg total) by mouth 3 (three) times daily as needed for dizziness. 30 tablet 0   Naphazoline HCl (CLEAR EYES OP) Place 1 drop into both eyes daily as needed (Runny eyes).     nilotinib (TASIGNA) 150 MG capsule TAKE 2 CAPSULES (300 MG TOTAL) BY MOUTH EVERY 12 (TWELVE) HOURS. TAKE ON AN  EMPTY STOMACH, 1 HR BEFORE OR 2 HRS AFTER FOOD. 112 capsule 12   sildenafil (VIAGRA) 100 MG tablet Take 0.5-1 tablets (50-100 mg total) by mouth daily as needed for erectile dysfunction. 5 tablet 11   Tiotropium Bromide-Olodaterol (STIOLTO RESPIMAT) 2.5-2.5 MCG/ACT AERS Inhale 2 puffs into the lungs every morning.     Xylometazoline HCl (4-WAY NASAL SPRAY NA) Place 1 spray into both nostrils daily as needed (Congestion).     No current facility-administered medications for this encounter.    Allergies  Allergen Reactions   Breztri Aerosphere [Budeson-Glycopyrrol-Formoterol] Swelling    Swelling of mouth tongue    Norvasc [Amlodipine Besylate] Rash    rash   Sulfa Antibiotics Hives  Hives     Social History   Socioeconomic History   Marital status: Married    Spouse name: Not on file   Number of children: 2   Years of education: Not on file   Highest education level: Not on file  Occupational History    Comment: retired Development worker, international [employer]  Tobacco Use   Smoking status: Former    Packs/day: 3.00    Years: 45.00    Pack years: 135.00    Types: Cigarettes    Quit date: 08/08/1997    Years since quitting: 24.4    Passive exposure: Past   Smokeless tobacco: Former  Scientific laboratory technician Use: Never used  Substance and Sexual Activity   Alcohol use: No    Alcohol/week: 0.0 standard drinks   Drug use: No   Sexual activity: Yes  Other Topics Concern   Not on file  Social History Narrative   Not on file   Social Determinants of Health   Financial Resource Strain: Low Risk    Difficulty of Paying Living Expenses: Not hard at all  Food Insecurity: No Food Insecurity   Worried About Charity fundraiser in the Last Year: Never true   [place] in the Last Year: Never true  Transportation Needs: No Transportation Needs   Lack of Transportation (Medical): No   Lack of Transportation (Non-Medical): No  Physical Activity: Insufficiently Active   Days of Exercise per Week: 3  days   Minutes of Exercise per Session: 30 min  Stress: No Stress Concern Present   Feeling of Stress : Not at all  Social Connections: Socially Integrated   Frequency of Communication with Friends and Family: More than three times a week   Frequency of Social Gatherings with Friends and Family: More than three times a week   Attends Religious Services: More than 4 times per year   Active Member of [employer] or Organizations: Yes   Attends Music therapist: More than 4 times per year   Marital Status: Married  Human resources officer Violence: Not At Risk   Fear of Current or Ex-Partner: No   Emotionally Abused: No   Physically Abused: No   Sexually Abused: No    Family History  Problem Relation Age of Onset   Heart disease Mother    Alcohol abuse Father     ROS- All systems are reviewed and negative except as per the HPI above  Physical Exam: There were no vitals filed for this visit. Wt Readings from Last 3 Encounters:  12/23/21 76.2 kg  12/06/21 82.6 kg  12/06/21 82.6 kg    Labs: Lab Results  Component Value Date   NA 145 12/13/2021   K 3.6 12/13/2021   CL 108 12/13/2021   CO2 31 12/13/2021   GLUCOSE 113 (H) 12/13/2021   BUN 12 12/13/2021   CREATININE 1.15 12/13/2021   CALCIUM 8.6 (L) 12/13/2021   MG 1.9 09/27/2021   Lab Results  Component Value Date   INR 1.25 11/26/2014   Lab Results  Component Value Date   CHOL 116 02/18/2021   HDL 36 (L) 02/18/2021   LDLCALC 63 02/18/2021   TRIG 90 02/18/2021     GEN- The patient is well appearing, alert and oriented x 3 today.   Head- normocephalic, atraumatic Eyes-  Sclera clear, conjunctiva pink Ears- hearing intact Oropharynx- clear Neck- supple, no JVP Lymph- no cervical lymphadenopathy Lungs- Clear to ausculation bilaterally, normal work of breathing Heart- Regular  rate and rhythm, no murmurs, rubs or gallops, PMI not laterally displaced GI- soft, NT, ND, + BS Extremities- no clubbing, cyanosis, or  edema MS- no significant deformity or atrophy Skin- no rash or lesion Psych- euthymic mood, full affect Neuro- strength and sensation are intact  EKG-EKG reads SR but on closer look he remains in atrial flutter at 77 bpm with RBBB, with flutter waves more visible in V1.    Assessment and Plan:  1. Afib/Aflutter New onset in April 2023 He has now been on anticoagulation without any missed doses for one  month per patient and is scheduled for cardioversion 5/30 Risk vrs benefit explained to pt and wife and they want to proceed  Continue  carvedilol 25 mg bid  Bmet/cbc today   2. CHA2DS2VASc score of at least 7 Continue eliquis 5 mg bid   3. CHF Appears normovolemic Breathing much improved since thoracentesis   4. HTN Stable   Return to afib clinic one week after cardioversion   Butch Penny C. Kamariya Blevens, [facility] [address] [place], [place] [address] [phone]

## 2021-12-29 NOTE — Progress Notes (Signed)
Primary Care Physician: [name], MD Referring Physician: Dr. Ernest Mallick Anthony Wolfe is a 76 y.o. male with a h/o CAD, s/p CABG, Carotid disease, HTN, TIA, NSTEMI, HF, that saw Dr. Ali Lowe 4/14  for  new onset afib and started on eliquis and coreg. He saw  pt back 5/1 with pt c/o of increasing shortness of breath. He was sent to the ER and had a large  L pleural effusion and  had thoracentesis that yielded 850 cc for cloudy dark yellow fluid. H/o of pleural effusions in 2016 requiring thoracentesis.  He had to miss 2 doses of eliquis for the lung tap  so cardioversion was delayed. He is now in the afib clinic to be evaluated prior to scheduled cardioversion 5/30.   He states that he has not missed any anticoagulation for the last month. He has been cramping in his legs since being discharged from the hospital on 40 mg lasix. His wife started him on OTC K+, but he is still cramping. His breathing is improved since the thoracentesis but he is not back to baseline with his breathing and has noted fatigue since dx with afib.     Today, he denies symptoms of palpitations, chest pain, shortness of breath, orthopnea, PND, lower extremity edema, dizziness, presyncope, syncope, or neurologic sequela. The patient is tolerating medications without difficulties and is otherwise without complaint today.   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 difficile colitis 06/16/2014   CAD (coronary artery disease)    a. s/p 3v CABG 2010. b. NSTEMI 05/2014 in setting of VT. Cath impressions: "Recent IMI with occluded SVG to RCA. Has Right to Right collaterals and collaterals from septal and OM. EF 40%."   Carotid disease, bilateral ([place])    a. Right CEA 2002; known occluded left carotid. b. Duplex 06/2014: known occluded LICA, stable 6-19%  RICA s/p CEA.   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 Prior EF 36%. b. 2014: 50%. c. 05/2014: EF 35-40% by echo, 40% with inf HK by cath.   NSTEMI (non-ST elevation myocardial infarction) ([place]) 05/2014   Pleural effusion 05/2014   Pneumonia 05/23/2014   Presence of permanent cardiac pacemaker    Prostate hypertrophy    PVC's (premature ventricular contractions)    a. PVC's with prior Holter showing PVC load of 22%.   Shortness of breath dyspnea    Stroke ([place]) 01   TIA (transient ischemic attack)    ASPVD, S./P. right CEA, 2002, and known occluded left carotid   Ventricular tachycardia ([place])    a. Admitted with VT 05/2013 - EPS with inducible sustained monomorphic VT; s/p Medtronic ICD 05/21/14.   Past Surgical History:  Procedure Laterality Date   BACK SURGERY     CAROTID ENDARTERECTOMY Right 01   carotidectomy  2003   right side   COLONOSCOPY  07/2010   CORONARY ARTERY BYPASS GRAFT  02/2009   3 vessel   ELECTROPHYSIOLOGIC STUDY  05/21/14   ELECTROPHYSIOLOGY STUDY N/A 05/21/2014   Procedure: ELECTROPHYSIOLOGY STUDY;  Surgeon: Deboraha Sprang, MD;  Location: [facility]  CATH LAB;  Service: Cardiovascular;  Laterality: N/A;   EP IMPLANTABLE DEVICE  05/21/14   single chamber Metronic ICD   IR THORACENTESIS ASP PLEURAL SPACE W/IMG GUIDE  12/07/2021   LEFT HEART CATHETERIZATION WITH CORONARY/GRAFT ANGIOGRAM N/A 05/19/2014   Procedure: LEFT HEART CATHETERIZATION WITH Beatrix Fetters;  Surgeon: Josue Hector, MD;  Location: [facility] CATH LAB;  Service: Cardiovascular;  Laterality: N/A;   PLEURAL BIOPSY N/A 11/27/2014   Procedure: PLEURAL BIOPSY;  Surgeon: [name], MD;  Location: [place];  Service: Thoracic;  Laterality: N/A;   PLEURAL EFFUSION DRAINAGE Right 11/27/2014   Procedure: DRAINAGE OF PLEURAL  EFFUSION;  Surgeon: [name], MD;  Location: [place];  Service: Thoracic;  Laterality: Right;   TALC PLEURODESIS Right 11/27/2014   Procedure: Pietro Cassis;  Surgeon: [name], MD;  Location: [place];  Service: Thoracic;  Laterality: Right;   VIDEO ASSISTED THORACOSCOPY Right 11/27/2014   Procedure: RIGHT VIDEO ASSISTED THORACOSCOPY;  Surgeon: [name], MD;  Location: [place];  Service: Thoracic;  Laterality: Right;    Current Outpatient Medications  Medication Sig Dispense Refill   albuterol (VENTOLIN HFA) 108 (90 Base) MCG/ACT inhaler INHALE 2 PUFFS BY MOUTH EVERY 6 HOURS AS NEEDED FOR WHEEZING FOR SHORTNESS OF BREATH 18 g 11   apixaban (ELIQUIS) 5 MG TABS tablet Take 1 tablet (5 mg total) by mouth 2 (two) times daily. 60 tablet 6   atorvastatin (LIPITOR) 80 MG tablet TAKE 1 TABLET BY MOUTH ONCE DAILY AT  6  PM 90 tablet 3   carvedilol (COREG) 25 MG tablet Take 1 tablet (25 mg total) by mouth 2 (two) times daily. 180 tablet 3   clopidogrel (PLAVIX) 75 MG tablet Take 1 tablet by mouth once daily 90 tablet 3   diphenoxylate-atropine (LOMOTIL) 2.5-0.025 MG tablet Take 1 tablet by mouth 4 (four) times daily as needed for diarrhea or loose stools. 30 tablet 0   empagliflozin (JARDIANCE) 10 MG TABS tablet Take 1 tablet (10 mg total) by mouth daily before breakfast. 30 tablet 11   furosemide (LASIX) 20 MG tablet Take 1 tablet (20 mg total) by mouth as needed for fluid or edema. (Patient taking differently: Take 40 mg by mouth daily.) 90 tablet 1   losartan (COZAAR) 25 MG tablet Take 1 tablet (25 mg total) by mouth at bedtime. 90 tablet 3   meclizine (ANTIVERT) 25 MG tablet Take 1 tablet (25 mg total) by mouth 3 (three) times daily as needed for dizziness. 30 tablet 0   Naphazoline HCl (CLEAR EYES OP) Place 1 drop into both eyes daily as needed (Runny eyes).     nilotinib (TASIGNA) 150 MG capsule TAKE 2 CAPSULES (300 MG TOTAL) BY MOUTH EVERY 12 (TWELVE) HOURS. TAKE ON AN  EMPTY STOMACH, 1 HR BEFORE OR 2 HRS AFTER FOOD. 112 capsule 12   sildenafil (VIAGRA) 100 MG tablet Take 0.5-1 tablets (50-100 mg total) by mouth daily as needed for erectile dysfunction. 5 tablet 11   Tiotropium Bromide-Olodaterol (STIOLTO RESPIMAT) 2.5-2.5 MCG/ACT AERS Inhale 2 puffs into the lungs every morning.     Xylometazoline HCl (4-WAY NASAL SPRAY NA) Place 1 spray into both nostrils daily as needed (Congestion).     No current facility-administered medications for this encounter.    Allergies  Allergen Reactions   Breztri Aerosphere [Budeson-Glycopyrrol-Formoterol] Swelling    Swelling of mouth tongue    Norvasc [Amlodipine Besylate] Rash    rash   Sulfa Antibiotics Hives  Hives     Social History   Socioeconomic History   Marital status: Married    Spouse name: Not on file   Number of children: 2   Years of education: Not on file   Highest education level: Not on file  Occupational History    Comment: retired Development worker, international aid  Tobacco Use   Smoking status: Former    Packs/day: 3.00    Years: 45.00    Pack years: 135.00    Types: Cigarettes    Quit date: 08/08/1997    Years since quitting: 24.4    Passive exposure: Past   Smokeless tobacco: Former  Scientific laboratory technician Use: Never used  Substance and Sexual Activity   Alcohol use: No    Alcohol/week: 0.0 standard drinks   Drug use: No   Sexual activity: Yes  Other Topics Concern   Not on file  Social History Narrative   Not on file   Social Determinants of Health   Financial Resource Strain: Low Risk    Difficulty of Paying Living Expenses: Not hard at all  Food Insecurity: No Food Insecurity   Worried About Charity fundraiser in the Last Year: Never true   [place] in the Last Year: Never true  Transportation Needs: No Transportation Needs   Lack of Transportation (Medical): No   Lack of Transportation (Non-Medical): No  Physical Activity: Insufficiently Active   Days of Exercise per Week: 3  days   Minutes of Exercise per Session: 30 min  Stress: No Stress Concern Present   Feeling of Stress : Not at all  Social Connections: Socially Integrated   Frequency of Communication with Friends and Family: More than three times a week   Frequency of Social Gatherings with Friends and Family: More than three times a week   Attends Religious Services: More than 4 times per year   Active Member of [employer] or Organizations: Yes   Attends Music therapist: More than 4 times per year   Marital Status: Married  Human resources officer Violence: Not At Risk   Fear of Current or Ex-Partner: No   Emotionally Abused: No   Physically Abused: No   Sexually Abused: No    Family History  Problem Relation Age of Onset   Heart disease Mother    Alcohol abuse Father     ROS- All systems are reviewed and negative except as per the HPI above  Physical Exam: There were no vitals filed for this visit. Wt Readings from Last 3 Encounters:  12/23/21 76.2 kg  12/06/21 82.6 kg  12/06/21 82.6 kg    Labs: Lab Results  Component Value Date   NA 145 12/13/2021   K 3.6 12/13/2021   CL 108 12/13/2021   CO2 31 12/13/2021   GLUCOSE 113 (H) 12/13/2021   BUN 12 12/13/2021   CREATININE 1.15 12/13/2021   CALCIUM 8.6 (L) 12/13/2021   MG 1.9 09/27/2021   Lab Results  Component Value Date   INR 1.25 11/26/2014   Lab Results  Component Value Date   CHOL 116 02/18/2021   HDL 36 (L) 02/18/2021   LDLCALC 63 02/18/2021   TRIG 90 02/18/2021     GEN- The patient is well appearing, alert and oriented x 3 today.   Head- normocephalic, atraumatic Eyes-  Sclera clear, conjunctiva pink Ears- hearing intact Oropharynx- clear Neck- supple, no JVP Lymph- no cervical lymphadenopathy Lungs- Clear to ausculation bilaterally, normal work of breathing Heart- Regular  rate and rhythm, no murmurs, rubs or gallops, PMI not laterally displaced GI- soft, NT, ND, + BS Extremities- no clubbing, cyanosis, or  edema MS- no significant deformity or atrophy Skin- no rash or lesion Psych- euthymic mood, full affect Neuro- strength and sensation are intact  EKG-EKG reads SR but on closer look he remains in atrial flutter at 77 bpm with RBBB, with flutter waves more visible in V1.    Assessment and Plan:  1. Afib/Aflutter New onset in April 2023 He has now been on anticoagulation without any missed doses for one  month per patient and is scheduled for cardioversion 5/30 Risk vrs benefit explained to pt and wife and they want to proceed  Continue  carvedilol 25 mg bid  Bmet/cbc today   2. CHA2DS2VASc score of at least 7 Continue eliquis 5 mg bid   3. CHF Appears normovolemic Breathing much improved since thoracentesis   4. HTN Stable   Return to afib clinic one week after cardioversion   Butch Penny C. Merion Caton, [facility] [address] [place], [place] [address] [phone]

## 2022-01-03 ENCOUNTER — Other Ambulatory Visit: Payer: Self-pay | Admitting: Internal Medicine

## 2022-01-04 ENCOUNTER — Ambulatory Visit (HOSPITAL_COMMUNITY)
Admission: RE | Admit: 2022-01-04 | Discharge: 2022-01-04 | Disposition: A | Discharge status: Home / Self Care | Payer: Medicare HMO | Attending: Internal Medicine | Admitting: Internal Medicine

## 2022-01-04 ENCOUNTER — Ambulatory Visit (HOSPITAL_COMMUNITY): Payer: Medicare HMO | Admitting: Anesthesiology

## 2022-01-04 ENCOUNTER — Ambulatory Visit (HOSPITAL_BASED_OUTPATIENT_CLINIC_OR_DEPARTMENT_OTHER): Payer: Medicare HMO | Admitting: Anesthesiology

## 2022-01-04 ENCOUNTER — Telehealth: Payer: Self-pay | Admitting: Family Medicine

## 2022-01-04 ENCOUNTER — Encounter (HOSPITAL_COMMUNITY)
Admission: RE | Disposition: A | Discharge status: Home / Self Care | Payer: Self-pay | Source: Home / Self Care | Attending: Internal Medicine

## 2022-01-04 ENCOUNTER — Other Ambulatory Visit: Payer: Self-pay | Admitting: Family Medicine

## 2022-01-04 ENCOUNTER — Other Ambulatory Visit: Payer: Self-pay

## 2022-01-04 DIAGNOSIS — I2510 Atherosclerotic heart disease of native coronary artery without angina pectoris: Secondary | ICD-10-CM | POA: Insufficient documentation

## 2022-01-04 DIAGNOSIS — I4892 Unspecified atrial flutter: Secondary | ICD-10-CM | POA: Insufficient documentation

## 2022-01-04 DIAGNOSIS — I252 Old myocardial infarction: Secondary | ICD-10-CM | POA: Diagnosis not present

## 2022-01-04 DIAGNOSIS — Z87891 Personal history of nicotine dependence: Secondary | ICD-10-CM | POA: Insufficient documentation

## 2022-01-04 DIAGNOSIS — Z8673 Personal history of transient ischemic attack (TIA), and cerebral infarction without residual deficits: Secondary | ICD-10-CM | POA: Insufficient documentation

## 2022-01-04 DIAGNOSIS — Z7901 Long term (current) use of anticoagulants: Secondary | ICD-10-CM | POA: Insufficient documentation

## 2022-01-04 DIAGNOSIS — Z951 Presence of aortocoronary bypass graft: Secondary | ICD-10-CM | POA: Insufficient documentation

## 2022-01-04 DIAGNOSIS — I4891 Unspecified atrial fibrillation: Secondary | ICD-10-CM | POA: Insufficient documentation

## 2022-01-04 DIAGNOSIS — Z79899 Other long term (current) drug therapy: Secondary | ICD-10-CM | POA: Diagnosis not present

## 2022-01-04 DIAGNOSIS — I509 Heart failure, unspecified: Secondary | ICD-10-CM

## 2022-01-04 DIAGNOSIS — I5022 Chronic systolic (congestive) heart failure: Secondary | ICD-10-CM | POA: Diagnosis not present

## 2022-01-04 DIAGNOSIS — I110 Hypertensive heart disease with heart failure: Secondary | ICD-10-CM | POA: Insufficient documentation

## 2022-01-04 DIAGNOSIS — I4819 Other persistent atrial fibrillation: Secondary | ICD-10-CM | POA: Diagnosis not present

## 2022-01-04 HISTORY — PX: CARDIOVERSION: SHX1299

## 2022-01-04 SURGERY — CARDIOVERSION
Anesthesia: General

## 2022-01-04 MED ORDER — SODIUM CHLORIDE 0.9 % IV SOLN: INTRAVENOUS | Status: DC

## 2022-01-04 MED ORDER — SODIUM CHLORIDE 0.9 % IV SOLN
INTRAVENOUS | Status: AC | PRN
  Administered 2022-01-04: 500 mL via INTRAVENOUS

## 2022-01-04 MED ORDER — PROPOFOL 10 MG/ML IV BOLUS
INTRAVENOUS | Status: DC | PRN
  Administered 2022-01-04: 60 mg via INTRAVENOUS

## 2022-01-04 MED ORDER — PHENYLEPHRINE 80 MCG/ML (10ML) SYRINGE FOR IV PUSH (FOR BLOOD PRESSURE SUPPORT)
PREFILLED_SYRINGE | INTRAVENOUS | Status: DC | PRN
Start: 2022-01-04 — End: 2022-01-04
  Administered 2022-01-04: 160 ug via INTRAVENOUS

## 2022-01-04 MED ORDER — LIDOCAINE 2% (20 MG/ML) 5 ML SYRINGE
INTRAMUSCULAR | Status: DC | PRN
  Administered 2022-01-04: 60 mg via INTRAVENOUS

## 2022-01-04 MED ORDER — TRAMADOL HCL 50 MG PO TABS: 50.0000 mg | ORAL_TABLET | Freq: Three times a day (TID) | ORAL | 0 refills | Status: AC | PRN

## 2022-01-04 NOTE — Anesthesia Postprocedure Evaluation (Signed)
Anesthesia Post Note  Patient: Anthony Wolfe  Procedure(s) Performed: CARDIOVERSION     Patient location during evaluation: Endoscopy Anesthesia Type: General Level of consciousness: awake Pain management: pain level controlled Vital Signs Assessment: post-procedure vital signs reviewed and stable Respiratory status: spontaneous breathing, nonlabored ventilation, respiratory function stable and patient connected to nasal cannula oxygen Cardiovascular status: blood pressure returned to baseline and stable Postop Assessment: no apparent nausea or vomiting Anesthetic complications: no   No notable events documented.  Last Vitals:  Vitals:   01/04/22 1015 01/04/22 1024  BP: (!) 101/52 129/61  Pulse: 64 65  Resp: 20 (!) 21  Temp:    SpO2: 97% 99%    Last Pain:  Vitals:   01/04/22 1015  TempSrc:   PainSc: 0-No pain                 Deng Kemler P Karanvir Balderston

## 2022-01-04 NOTE — Anesthesia Procedure Notes (Signed)
Procedure Name: General with mask airway Date/Time: 01/04/2022 9:55 AM Performed by: Lorie Phenix, CRNA Pre-anesthesia Checklist: Patient identified, Emergency Drugs available, Suction available, Patient being monitored and Timeout performed Oxygen Delivery Method: Ambu bag Preoxygenation: Pre-oxygenation with 100% oxygen Placement Confirmation: positive ETCO2

## 2022-01-04 NOTE — Transfer of Care (Signed)
Immediate Anesthesia Transfer of Care Note  Patient: Anthony Wolfe  Procedure(s) Performed: CARDIOVERSION  Patient Location: Endoscopy Unit  Anesthesia Type:General  Level of Consciousness: drowsy  Airway & Oxygen Therapy: Patient Spontanous Breathing  Post-op Assessment: Report given to RN and Post -op Vital signs reviewed and stable  Post vital signs: Reviewed and stable  Last Vitals:  Vitals Value Taken Time  BP 93/48   Temp    Pulse 65   Resp 27   SpO2 95     Last Pain:  Vitals:   01/04/22 0904  TempSrc: Temporal  PainSc: 0-No pain         Complications: No notable events documented.

## 2022-01-04 NOTE — Interval H&P Note (Signed)
History and Physical Interval Note:  01/04/2022 9:45 AM  Anthony Wolfe  has presented today for surgery, with the diagnosis of AFIB.  The various methods of treatment have been discussed with the patient and family. After consideration of risks, benefits and other options for treatment, the patient has consented to  Procedure(s): CARDIOVERSION (N/A) as a surgical intervention.  The patient's history has been reviewed, patient examined, no change in status, stable for surgery.  I have reviewed the patient's chart and labs.  Questions were answered to the patient's satisfaction.     Elouise Munroe

## 2022-01-04 NOTE — Anesthesia Preprocedure Evaluation (Addendum)
Anesthesia Evaluation  Patient identified by MRN, date of birth, ID band Patient awake    Reviewed: Allergy & Precautions, NPO status , Patient's Chart, lab work & pertinent test results  Airway Mallampati: II  TM Distance: >3 FB Neck ROM: Full    Dental no notable dental hx.    Pulmonary COPD,  COPD inhaler, former smoker,    Pulmonary exam normal        Cardiovascular hypertension, Pt. on home beta blockers and Pt. on medications + CAD, + Past MI, + CABG and +CHF  + dysrhythmias Atrial Fibrillation + pacemaker + Cardiac Defibrillator  Rhythm:Irregular Rate:Normal  ECHO: Left ventricular ejection fraction, by estimation, is 40 to 45%   Neuro/Psych PSYCHIATRIC DISORDERS Anxiety Depression TIACVA    GI/Hepatic negative GI ROS, Neg liver ROS,   Endo/Other  negative endocrine ROS  Renal/GU negative Renal ROS     Musculoskeletal negative musculoskeletal ROS (+)   Abdominal   Peds  Hematology  (+) Blood dyscrasia, anemia ,   Anesthesia Other Findings A-FIB  Reproductive/Obstetrics                            Anesthesia Physical Anesthesia Plan  ASA: 3  Anesthesia Plan: General   Post-op Pain Management:    Induction: Intravenous  PONV Risk Score and Plan: 2 and Propofol infusion and Treatment may vary due to age or medical condition  Airway Management Planned: Mask  Additional Equipment:   Intra-op Plan:   Post-operative Plan:   Informed Consent: I have reviewed the patients History and Physical, chart, labs and discussed the procedure including the risks, benefits and alternatives for the proposed anesthesia with the patient or authorized representative who has indicated his/her understanding and acceptance.     Dental advisory given  Plan Discussed with: CRNA  Anesthesia Plan Comments:        Anesthesia Quick Evaluation

## 2022-01-04 NOTE — CV Procedure (Signed)
Procedure: Electrical Cardioversion Indications:  Atrial Flutter  Procedure Details:  Consent: Risks of procedure as well as the alternatives and risks of each were explained to the (patient/caregiver).  Consent for procedure obtained.  Time Out: Verified patient identification, verified procedure, site/side was marked, verified correct patient position, special equipment/implants available, medications/allergies/relevent history reviewed, required imaging and test results available. PERFORMED.  Patient placed on cardiac monitor, pulse oximetry, supplemental oxygen as necessary.  Sedation given:  propofol per anesthesia Pacer pads placed anterior and posterior chest.  Cardioverted 2 time(s).  Cardioversion with synchronized biphasic 120J, 200J shock.  Evaluation: Findings: Post procedure EKG shows: NSR Complications: None Patient did tolerate procedure well.  Time Spent Directly with the Patient:  45 minutes   Elouise Munroe 01/04/2022, 10:08 AM

## 2022-01-04 NOTE — Telephone Encounter (Signed)
Patient called in asking for something for pain. He states the pain is in his left hip. He states it has been hurting for 3-4 days and that he was recently released from the hospital and he believes the pain is coming from that bed. He uses Paediatric nurse at [facility].  CB# [phone]

## 2022-01-05 ENCOUNTER — Ambulatory Visit (HOSPITAL_COMMUNITY): Payer: Medicare HMO

## 2022-01-05 ENCOUNTER — Encounter (HOSPITAL_COMMUNITY): Payer: Self-pay | Admitting: Internal Medicine

## 2022-01-10 ENCOUNTER — Ambulatory Visit (HOSPITAL_COMMUNITY): Payer: Medicare HMO | Admitting: Physician Assistant

## 2022-01-27 ENCOUNTER — Other Ambulatory Visit: Payer: Self-pay | Admitting: Internal Medicine

## 2022-01-31 NOTE — Progress Notes (Signed)
Cardiology Office Note:    Date:  02/03/2022   ID:  Anthony Wolfe, DOB 05/14/1946, MRN [id number]  PCP:  [name], MD   [facility] Providers Cardiologist:  Lenna Sciara, MD Referring MD: [name], MD   Chief Complaint/Reason for Referral: Dyspnea  ASSESSMENT:    1. Dyspnea, unspecified type   2. Atrial fibrillation, unspecified type ([place])   3. Cardiomyopathy, ischemic   4. Coronary artery disease involving native coronary artery of native heart without angina pectoris   5. Hyperlipidemia, unspecified hyperlipidemia type   6. Bilateral carotid artery disease, unspecified type ([place])   7. Aortic atherosclerosis ([place])   8. Other fatigue       PLAN:    In order of problems listed above: 1.  Dyspnea: Status post thoracentesis.  I have reached out to the patient's oncologist regarding whether this may be related to his chemotherapeutic agent.  He has a history of requiring multiple thoracenteses.  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 2.  Atrial fibrillation: Status post cardioversion May 2023.  I have asked him to restart his Eliquis and take saline nasal spray. 3.  Ischemic cardiomyopathy: Continue losartan, Jardiance, as needed Lasix, and Coreg.  He is euvolemic on exam today. 4.  Coronary artery disease: Continue beta-blocker and Plavix (due to carotid stenosis). 5.  Hyperlipidemia: Check lipid panel, CMP, and Lp(a). 6.  Bilateral carotid stenosis: Continue Plavix, PCP is monitoring carotid ultrasounds. 7.  Aortic atherosclerosis: Continue Plavix, statin, and strict blood pressure control. 8.  Fatigue.  We will check CBC CBC, CMP, and reflex TSH.  His recent labs demonstrated he was anemic.  If this persists he will need to speak to his PCP or oncologist.  Given daytime somnolence I will also refer him for sleep study.  Dispo: In approximately 1 year.      Medication Adjustments/Labs and Tests Ordered: Current medicines are reviewed at length with the patient today.  Concerns regarding medicines are outlined above.  The following changes have been made:     Labs/tests ordered: Orders Placed This Encounter  Procedures   CBC   Comp Met (CMET)   Lipid panel   Lipoprotein A (LPA)   TSH Rfx on Abnormal to Free T4   Split night study    Medication Changes: No orders of the defined types were placed in this encounter.    Current medicines are reviewed at length with the patient today.  The patient does not have concerns regarding medicines.   History of Present Illness:    FOCUSED PROBLEM LIST:   1.  Coronary artery disease status post CABG consisting of a LIMA to LAD, vein graft to obtuse marginal 1 and 2, and vein graft to RCA in 2010; the vein graft to RCA is known occluded on cardiac catheterization in 2015 2.  Ischemic cardiomyopathy EF 40-45% 3.  VT status post ICD followed by EP 4.  Hypertension 5.  Hyperlipidemia 6.  Peripheral vascular disease status post right carotid enterectomy with known occluded left internal carotid artery 7.  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  Emphysema 9.  Chronic occlusion of left internal carotid and 50 to 69% stenosis of right internal carotid followed by patient's PCP 10.  Right bundle branch block 11.  Atrial fibrillation CV2 score 4 on Eliquis;  status post cardioversion May 2023 12.  Aortic atherosclerosis on chest CT 2018 13.  History of  multiple thoracenteses for pleural effusions  March 2023: Patient seen for initial consultation to reestablish cardiovascular care.  At that appointment losartan was started as well as Jardiance and his Coreg was increased to 25 mg.  April 2023: Patient was seen for expedited follow-up due to the detection of atrial fibrillation at his PCPs office.  He was started on Eliquis 5 mg twice daily.  His mexiletine was discontinued.  He was referred for  cardioversion.  Additionally his losartan was started at 25 mg every afternoon.  May 2023: The patient was seen for expedited follow-up due to worsening shortness of breath.  He was sent to the emergency department due to suspected pleural effusion.  He was admitted and underwent thoracentesis.  Around 850 cc of straw-colored fluid was evacuated.  Cytology was negative.  Laboratories were consistent with an exudative effusion.  Today: In the interim he underwent cardioversion at the end of May to normal sinus rhythm.  He had a nuisance nosebleed earlier this week and stopped his Eliquis.  He has complaints of fatigue.  However he is able to do a lot of activities including working outside.  He denies any significant shortness of breath, paroxysmal nocturnal dyspnea, orthopnea.  He feels better after his cardioversion is well.  He denies any exertional angina.  He has not required emergency room visits or hospitalizations.  He is otherwise well and without complaints.   Current Medications: Current Meds  Medication Sig   albuterol (VENTOLIN HFA) 108 (90 Base) MCG/ACT inhaler INHALE 2 PUFFS BY MOUTH EVERY 6 HOURS AS NEEDED FOR WHEEZING FOR SHORTNESS OF BREATH   apixaban (ELIQUIS) 5 MG TABS tablet Take 1 tablet (5 mg total) by mouth 2 (two) times daily.   atorvastatin (LIPITOR) 80 MG tablet TAKE 1 TABLET BY MOUTH ONCE DAILY AT  6  PM   carvedilol (COREG) 25 MG tablet Take 1 tablet (25 mg total) by mouth 2 (two) times daily.   clopidogrel (PLAVIX) 75 MG tablet Take 1 tablet by mouth once daily   diphenoxylate-atropine (LOMOTIL) 2.5-0.025 MG tablet Take 1 tablet by mouth 4 (four) times daily as needed for diarrhea or loose stools.   empagliflozin (JARDIANCE) 10 MG TABS tablet Take 1 tablet (10 mg total) by mouth daily before breakfast.   furosemide (LASIX) 20 MG tablet Take 1 tablet (20 mg total) by mouth as needed for fluid or edema. (Patient taking differently: Take 40 mg by mouth daily.)   losartan  (COZAAR) 25 MG tablet Take 1 tablet (25 mg total) by mouth at bedtime.   meclizine (ANTIVERT) 25 MG tablet Take 1 tablet (25 mg total) by mouth 3 (three) times daily as needed for dizziness.   Naphazoline HCl (CLEAR EYES OP) Place 1 drop into both eyes daily as needed (Runny eyes).   sildenafil (VIAGRA) 100 MG tablet Take 0.5-1 tablets (50-100 mg total) by mouth daily as needed for erectile dysfunction.   Tiotropium Bromide-Olodaterol (STIOLTO RESPIMAT) 2.5-2.5 MCG/ACT AERS Inhale 2 puffs into the lungs every morning.   Xylometazoline HCl (4-WAY NASAL SPRAY NA) Place 1 spray into both nostrils daily as needed (Congestion).     Allergies:    Breztri aerosphere [budeson-glycopyrrol-formoterol], Norvasc [amlodipine besylate], and Sulfa antibiotics   Social History:   Social History   Tobacco Use   Smoking status: Former    Packs/day: 3.00    Years: 45.00    Total pack years: 135.00    Types: Cigarettes    Quit date: 08/08/1997  Years since quitting: 24.5    Passive exposure: Past   Smokeless tobacco: Former  Scientific laboratory technician Use: Never used  Substance Use Topics   Alcohol use: No    Alcohol/week: 0.0 standard drinks of alcohol   Drug use: No     Family Hx: Family History  Problem Relation Age of Onset   Heart disease Mother    Alcohol abuse Father      Review of Systems:   Please see the history of present illness.    All other systems reviewed and are negative.     EKGs/Labs/Other Test Reviewed:    EKG:  EKG performed April 2023 that I personally reviewed demonstrates atrial fibrillation with right bundle branch block; EKG today which I personally reviewed demonstrates atrial fibrillation with a right bundle branch block and left anterior fascicular block.  Prior CV studies:  Carotid ultrasound 2023 1. Mildly elevated peak systolic and end-diastolic velocities in the right internal carotid artery indicative of 50-69% stenosis. This has increased since prior  examination. 2. Chronic occlusion of proximal left internal carotid artery.  TTE 2023 Ejection fraction of 40 to 27% grade 1 diastolic dysfunction, severe akinesis of the basal mid inferoseptal and inferior wall, low normal right ventricular function and no significant valvular abnormalities   Lexiscan stress test 2018 Nuclear stress EF: 35%. Inferior/inferoseptal defect consistent with scar No significant ischemia Intermediate risk study    Other studies Reviewed: Review of the additional studies/records demonstrates: Chest CT 2018 demonstrates aortic atherosclerosis  Recent Labs: 09/27/2021: Magnesium 1.9 12/06/2021: B Natriuretic Peptide 719.7 12/13/2021: ALT 10 12/29/2021: BUN 11; Creatinine, Ser 1.15; Hemoglobin 9.5; Platelets 214; Potassium 3.7; Sodium 142   Recent Lipid Panel Lab Results  Component Value Date/Time   CHOL 116 02/18/2021 08:35 AM   CHOL 102 03/15/2017 11:29 AM   TRIG 90 02/18/2021 08:35 AM   HDL 36 (L) 02/18/2021 08:35 AM   HDL 31 (L) 03/15/2017 11:29 AM   LDLCALC 63 02/18/2021 08:35 AM    Risk Assessment/Calculations:     CHA2DS2-VASc Score = 4   This indicates a 4.8% annual risk of stroke. The patient's score is based upon: CHF History: 0 HTN History: 1 Diabetes History: 0 Stroke History: 0 Vascular Disease History: 1 Age Score: 2 Gender Score: 0         Physical Exam:    VS:  BP 124/80   Pulse 74   Ht $R'5\' 9"'cl$  (1.753 m)   Wt 168 lb (76.2 kg)   SpO2 98%   BMI 24.81 kg/m    Wt Readings from Last 3 Encounters:  02/03/22 168 lb (76.2 kg)  01/04/22 175 lb (79.4 kg)  12/29/21 173 lb 3.2 oz (78.6 kg)    GENERAL:  No apparent distress, AOx3 HEENT:  No carotid bruits, +2 carotid impulses, no scleral icterus, JVD not elevated CAR: Irregular no murmurs, gallops, rubs, or thrills RES:  Clear to auscultation bilaterally without wheezing ABD:  Soft, nontender, nondistended, positive bowel sounds x 4 VASC:  +2 radial pulses, +2 carotid pulses,  palpable pedal pulses NEURO:  CN 2-12 grossly intact; motor and sensory grossly intact PSYCH:  No active depression or anxiety EXT:  No edema, ecchymosis, or cyanosis  Signed, Early Osmond, MD  02/03/2022 9:29 AM    [address], [place], [place]  [address] Phone: [phone]; Fax: [phone]   Note:  This document was prepared using Dragon voice recognition software and  may include unintentional dictation errors.

## 2022-02-01 ENCOUNTER — Telehealth: Payer: Self-pay

## 2022-02-03 ENCOUNTER — Ambulatory Visit (INDEPENDENT_AMBULATORY_CARE_PROVIDER_SITE_OTHER): Payer: Medicare HMO | Admitting: Internal Medicine

## 2022-02-03 ENCOUNTER — Encounter: Payer: Self-pay | Admitting: Internal Medicine

## 2022-02-03 VITALS — BP 124/80 | HR 74 | Ht 69.0 in | Wt 168.0 lb

## 2022-02-03 DIAGNOSIS — I255 Ischemic cardiomyopathy: Secondary | ICD-10-CM

## 2022-02-03 DIAGNOSIS — I2510 Atherosclerotic heart disease of native coronary artery without angina pectoris: Secondary | ICD-10-CM | POA: Diagnosis not present

## 2022-02-03 DIAGNOSIS — I700 Atherosclerosis of aorta: Secondary | ICD-10-CM | POA: Diagnosis not present

## 2022-02-03 DIAGNOSIS — I4891 Unspecified atrial fibrillation: Secondary | ICD-10-CM

## 2022-02-03 DIAGNOSIS — R5383 Other fatigue: Secondary | ICD-10-CM

## 2022-02-03 DIAGNOSIS — R0600 Dyspnea, unspecified: Secondary | ICD-10-CM | POA: Diagnosis not present

## 2022-02-03 DIAGNOSIS — E785 Hyperlipidemia, unspecified: Secondary | ICD-10-CM | POA: Diagnosis not present

## 2022-02-03 DIAGNOSIS — I779 Disorder of arteries and arterioles, unspecified: Secondary | ICD-10-CM

## 2022-02-03 NOTE — Patient Instructions (Signed)
Medication Instructions:  Your physician recommends that you continue on your current medications as directed. Please refer to the Current Medication list given to you today. Restart Eliquis   *If you need a refill on your cardiac medications before your next appointment, please call your pharmacy*   Lab Work: Rfx TSH -to Free T4, CBC, BMET, Lipids, LPA If you have labs (blood work) drawn today and your tests are completely normal, you will receive your results only by: [place] (if you have MyChart) OR A paper copy in the mail If you have any lab test that is abnormal or we need to change your treatment, we will call you to review the results.   Testing/Procedures: Sleep Study Your physician has recommended that you have a sleep study. This test records several body functions during sleep, including: brain activity, eye movement, oxygen and carbon dioxide blood levels, heart rate and rhythm, breathing rate and rhythm, the flow of air through your mouth and nose, snoring, body muscle movements, and chest and belly movement.   Follow-Up: At [facility], you and your health needs are our priority.  As part of our continuing mission to provide you with exceptional heart care, we have created designated Provider Care Teams.  These Care Teams include your primary Cardiologist (physician) and Advanced Practice Providers (APPs -  Physician Assistants and Nurse Practitioners) who all work together to provide you with the care you need, when you need it.    Your next appointment:   12 month(s)  The format for your next appointment:   In Person  Provider:   Early Osmond, MD     Other Instructions   Important Information About Sugar

## 2022-02-04 ENCOUNTER — Telehealth: Payer: Self-pay

## 2022-02-04 LAB — COMPREHENSIVE METABOLIC PANEL
ALT: 12 IU/L (ref 0–44)
AST: 11 IU/L (ref 0–40)
Albumin/Globulin Ratio: 1.5 (ref 1.2–2.2)
Albumin: 4.1 g/dL (ref 3.7–4.7)
Alkaline Phosphatase: 74 IU/L (ref 44–121)
BUN/Creatinine Ratio: 12 (ref 10–24)
BUN: 10 mg/dL (ref 8–27)
Bilirubin Total: 0.7 mg/dL (ref 0.0–1.2)
CO2: 22 mmol/L (ref 20–29)
Calcium: 8.9 mg/dL (ref 8.6–10.2)
Chloride: 109 mmol/L — ABNORMAL HIGH (ref 96–106)
Creatinine, Ser: 0.82 mg/dL (ref 0.76–1.27)
Globulin, Total: 2.7 g/dL (ref 1.5–4.5)
Glucose: 90 mg/dL (ref 70–99)
Potassium: 4.1 mmol/L (ref 3.5–5.2)
Sodium: 143 mmol/L (ref 134–144)
Total Protein: 6.8 g/dL (ref 6.0–8.5)
eGFR: 90 mL/min/{1.73_m2} (ref >59)

## 2022-02-04 LAB — LIPID PANEL
Chol/HDL Ratio: 3.3 ratio (ref 0.0–5.0)
Cholesterol, Total: 137 mg/dL (ref 100–199)
HDL: 41 mg/dL (ref >39)
LDL Chol Calc (NIH): 80 mg/dL (ref 0–99)
Triglycerides: 83 mg/dL (ref 0–149)
VLDL Cholesterol Cal: 16 mg/dL (ref 5–40)

## 2022-02-04 LAB — TSH RFX ON ABNORMAL TO FREE T4: TSH: 3.25 u[IU]/mL (ref 0.450–4.500)

## 2022-02-04 LAB — CBC
Hematocrit: 30.5 % — ABNORMAL LOW (ref 37.5–51.0)
Hemoglobin: 10.1 g/dL — ABNORMAL LOW (ref 13.0–17.7)
MCH: 29.8 pg (ref 26.6–33.0)
MCHC: 33.1 g/dL (ref 31.5–35.7)
MCV: 90 fL (ref 79–97)
Platelets: 232 10*3/uL (ref 150–450)
RBC: 3.39 x10E6/uL — ABNORMAL LOW (ref 4.14–5.80)
RDW: 16.3 % — ABNORMAL HIGH (ref 11.6–15.4)
WBC: 6.4 10*3/uL (ref 3.4–10.8)

## 2022-02-04 LAB — LIPOPROTEIN A (LPA): Lipoprotein (a): 35.6 nmol/L (ref <75.0)

## 2022-02-04 MED ORDER — EZETIMIBE 10 MG PO TABS: 10.0000 mg | ORAL_TABLET | Freq: Every day | ORAL | 3 refills | Status: DC

## 2022-02-04 NOTE — Telephone Encounter (Signed)
-----   Message from Early Osmond, MD sent at 02/04/2022  6:39 AM EDT ----- Let him know his blood counts are low but around where they usually are.  Please add Zetia 10 for improved LDL control

## 2022-02-04 NOTE — Telephone Encounter (Signed)
The patient has been notified of the result and verbalized understanding.  All questions (if any) were answered. Antonieta Iba, RN 02/04/2022 1:05 PM  Rx for zetia 10 mg daily has been sent in.

## 2022-02-15 ENCOUNTER — Other Ambulatory Visit: Payer: Self-pay

## 2022-02-15 ENCOUNTER — Inpatient Hospital Stay: Payer: Medicare HMO | Attending: Hematology and Oncology | Admitting: Hematology and Oncology

## 2022-02-15 ENCOUNTER — Encounter: Payer: Self-pay | Admitting: Hematology and Oncology

## 2022-02-15 DIAGNOSIS — J90 Pleural effusion, not elsewhere classified: Secondary | ICD-10-CM | POA: Insufficient documentation

## 2022-02-15 DIAGNOSIS — I429 Cardiomyopathy, unspecified: Secondary | ICD-10-CM | POA: Diagnosis not present

## 2022-02-15 DIAGNOSIS — Z79899 Other long term (current) drug therapy: Secondary | ICD-10-CM | POA: Diagnosis not present

## 2022-02-15 DIAGNOSIS — I509 Heart failure, unspecified: Secondary | ICD-10-CM

## 2022-02-15 DIAGNOSIS — C9210 Chronic myeloid leukemia, BCR/ABL-positive, not having achieved remission: Secondary | ICD-10-CM | POA: Insufficient documentation

## 2022-02-15 MED ORDER — NILOTINIB HCL 50 MG PO CAPS: ORAL_CAPSULE | ORAL | 3 refills | Status: DC

## 2022-02-15 MED ORDER — TASIGNA 50 MG PO CAPS: ORAL_CAPSULE | ORAL | 3 refills | Status: DC

## 2022-02-15 NOTE — Assessment & Plan Note (Signed)
He had history of pleural effusion with Sprycel The cause of recurrent effusion now is unknown Review of CT imaging with the patient I will defer management to pulmonologist His examination today is benign As above, I plan to reduce the dose Tasigna

## 2022-02-15 NOTE — Assessment & Plan Note (Signed)
He has intermittent congestive heart failure Clinically, he is euvolemic today He will continue medical management

## 2022-02-15 NOTE — Assessment & Plan Note (Signed)
I have reviewed with the patient and his wife that the signal could be associated with risk of recurrent pleural effusion So far, he stated he has only been taking 1 capsule, meaning 150 mg twice daily instead of prescribed 300 mg twice daily Moving forward, I would like to reduce his dose to 100 mg twice daily I will send prescription with new instruction to his mail order pharmacy and I will see him next week as scheduled He expressed understanding

## 2022-02-15 NOTE — Progress Notes (Signed)
[place] OFFICE PROGRESS NOTE  Patient Care Team: [name], MD as PCP - General (Family Medicine) Early Osmond, MD as PCP - Cardiology (Cardiology) [name]., MD as Attending Physician (Cardiology) [name], MD (Family Medicine) [name], MD (Cardiothoracic Surgery) Early, Arvilla Meres, MD as Attending Physician (Vascular Surgery) [name], MD as Consulting Physician (Hematology and Oncology) Deboraha Sprang, MD as Consulting Physician (Cardiology) [name], [facility] as Pharmacist (Pharmacist) [name], [facility] as Pharmacist (Pharmacist)  ASSESSMENT & PLAN:  Chronic myelogenous leukemia I have reviewed with the patient and his wife that the signal could be associated with risk of recurrent pleural effusion So far, he stated he has only been taking 1 capsule, meaning 150 mg twice daily instead of prescribed 300 mg twice daily Moving forward, I would like to reduce his dose to 100 mg twice daily I will send prescription with new instruction to his mail order pharmacy and I will see him next week as scheduled He expressed understanding  Congestive heart failure with cardiomyopathy [facility]) He has intermittent congestive heart failure Clinically, he is euvolemic today He will continue medical management  Pleural effusion on left He had history of pleural effusion with Sprycel The cause of recurrent effusion now is unknown Review of CT imaging with the patient I will defer management to pulmonologist His examination today is benign As above, I plan to reduce the dose Tasigna  No orders of the defined types were placed in this encounter.   All questions were answered. The patient knows to call the clinic with any problems, questions or concerns. The total time spent in the appointment was 30 minutes encounter with patients including review of chart and various tests results, discussions about plan of care and  coordination of care plan   [name], MD 02/15/2022 3:23 PM  INTERVAL HISTORY: Please see below for problem oriented charting. he returns for treatment follow-up and review plan of care I was notified by his pulmonologist that he has developed loculated pleural effusion requiring thoracentesis recently The question is raised whether this is related to his treatment for CML The patient is compliant taking his medication but only at 150 mg twice daily over the past few months He felt great today Denies chest pain or shortness of breath or fluid retention no leg swelling  REVIEW OF SYSTEMS:   Constitutional: Denies fevers, chills or abnormal weight loss Eyes: Denies blurriness of vision Ears, nose, mouth, throat, and face: Denies mucositis or sore throat Respiratory: Denies cough, dyspnea or wheezes Cardiovascular: Denies palpitation, chest discomfort or lower extremity swelling Gastrointestinal:  Denies nausea, heartburn or change in bowel habits Skin: Denies abnormal skin rashes Lymphatics: Denies new lymphadenopathy or easy bruising Neurological:Denies numbness, tingling or new weaknesses Behavioral/Psych: Mood is stable, no new changes  All other systems were reviewed with the patient and are negative.  I have reviewed the past medical history, past surgical history, social history and family history with the patient and they are unchanged from previous note.  ALLERGIES:  is allergic to breztri aerosphere [budeson-glycopyrrol-formoterol], norvasc [amlodipine besylate], and sulfa antibiotics.  MEDICATIONS:  Current Outpatient Medications  Medication Sig Dispense Refill   albuterol (VENTOLIN HFA) 108 (90 Base) MCG/ACT inhaler INHALE 2 PUFFS BY MOUTH EVERY 6 HOURS AS NEEDED FOR WHEEZING FOR SHORTNESS OF BREATH 18 g 11   apixaban (ELIQUIS) 5 MG TABS tablet Take 1 tablet (5 mg total) by mouth 2 (two) times daily.  60 tablet 6   atorvastatin (LIPITOR) 80 MG tablet TAKE 1 TABLET BY  MOUTH ONCE DAILY AT  6  PM 90 tablet 3   carvedilol (COREG) 25 MG tablet Take 1 tablet (25 mg total) by mouth 2 (two) times daily. 180 tablet 3   clopidogrel (PLAVIX) 75 MG tablet Take 1 tablet by mouth once daily 90 tablet 3   diphenoxylate-atropine (LOMOTIL) 2.5-0.025 MG tablet Take 1 tablet by mouth 4 (four) times daily as needed for diarrhea or loose stools. 30 tablet 0   empagliflozin (JARDIANCE) 10 MG TABS tablet Take 1 tablet (10 mg total) by mouth daily before breakfast. 30 tablet 11   ezetimibe (ZETIA) 10 MG tablet Take 1 tablet (10 mg total) by mouth daily. 90 tablet 3   furosemide (LASIX) 20 MG tablet Take 1 tablet (20 mg total) by mouth as needed for fluid or edema. (Patient taking differently: Take 40 mg by mouth daily.) 90 tablet 1   losartan (COZAAR) 25 MG tablet Take 1 tablet (25 mg total) by mouth at bedtime. 90 tablet 3   meclizine (ANTIVERT) 25 MG tablet Take 1 tablet (25 mg total) by mouth 3 (three) times daily as needed for dizziness. 30 tablet 0   Naphazoline HCl (CLEAR EYES OP) Place 1 drop into both eyes daily as needed (Runny eyes).     nilotinib (TASIGNA) 50 MG capsule Take 2 capsules (200 mg total) by mouth every 12 hours 120 capsule 3   sildenafil (VIAGRA) 100 MG tablet Take 0.5-1 tablets (50-100 mg total) by mouth daily as needed for erectile dysfunction. 5 tablet 11   Tiotropium Bromide-Olodaterol (STIOLTO RESPIMAT) 2.5-2.5 MCG/ACT AERS Inhale 2 puffs into the lungs every morning.     Xylometazoline HCl (4-WAY NASAL SPRAY NA) Place 1 spray into both nostrils daily as needed (Congestion).     No current facility-administered medications for this visit.    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 BCR/ABL by PCR detected abnormalitis with b2a2 & b3a2 subtypes   05/09/2012 - 03/16/2015 Chemotherapy   He was started on treatment with Dasatinib 100 mg  daily   06/06/2012 Adverse Reaction   Dose of medication was reduced to 50 mg daily due to pancytopenia   01/24/2013 Progression   Patient was noted to have elevated blood count which and was subsequently found to be noncompliant to treatment. The patient has not been on treatment for several months because his prescription ran out. He was restarted back on treatment   01/16/2014 Progression   Bcr/ABL by PCR is worse. Dose of Dasatinib was increased to 100 mg.   02/26/2014 Tumor Marker   Blood work for ABL kinase mutation was negative.   10/29/2014 Tumor Marker   BCR/ABL b2a2 & b3a2 0.29%, IS 0.1624%, not in MMR yet but improving   11/05/2014 Adverse Reaction   He had thoracentesis due to pleural effusion   11/27/2014 Surgery   He underwent right video-assisted thoracoscopy, Drainage of pleural effusion, Pleural biopsy, Diaphragm biopsy, Lung biopsy & [place] pleurodesis   01/30/2015 Tumor Marker   BCR/ABL b2a2 & b3a2 0.78%, IS 0.4368%, not in MMR    03/11/2015 Pathology Results   BCR/ABL b2a2 0.19%, IS 0.1064%, not in MMR    03/16/2015 Adverse Reaction   Dasatinib was stopped due to recurrent pleural effusion   03/28/2015 - 11/06/2017 Chemotherapy   He started on Bosutinib  03/30/2015 Procedure   He has therapeutic thoracentesis of the right lung with 1 liter of fluid removed   03/30/2015 Adverse Reaction   Bosutinib is placed on hold, to be restart on 8/25 at 250 mg due to severe diarrhea   04/28/2015 Tumor Marker   BCR/ABL b2a2 0.06%, IS 0.0336%, in MMR    06/10/2015 Tumor Marker   BCR/ABL b2a2 0.14%, IS 0.1162%, not in MMR    08/14/2015 Pathology Results   BCR/ABL b2a2 0.02%, IS 0.0166%, In MMR    11/09/2015 Tumor Marker   BCR/ABL b2a2 0.005%, IS 0.0042%, In MMR    02/08/2016 Tumor Marker   BCR/ABL undetectable. In MMR   06/13/2016 Tumor Marker   BCR/ABL undetectable. In MMR   12/06/2016 Pathology Results   BCR/ABL undetectable. In MMR   06/08/2017 Pathology Results   BCR/ABL  undetectable. In MMR   09/08/2017 Pathology Results   BCR/ABL undetectable. In MMR   12/11/2017 Pathology Results   BCR/ABL undetectable. In MMR   03/19/2018 Pathology Results   BCR/ABL undetectable. In MMR   06/12/2018 Pathology Results   BCR/ABL undetectable. In MMR   09/06/2018 Pathology Results   BCR ABL is detectable. e13a2 (b2a2) and [id number]) IS: 0.009%   09/26/2018 -  Chemotherapy   The patient had Tasigna    11/21/2018 Pathology Results   BCR/ABL undetectable. In MMR   03/07/2019 Pathology Results   BCR/ABL undetectable. In MMR   06/17/2019 Pathology Results   BCR/ABL undetectable. In MMR   10/14/2019 Pathology Results   BCR ABL is detectable. e13a2 (b2a2) and K74Q5(Z5G3) IS: 0.0281%. In MMR   01/14/2020 Pathology Results   BCR ABL is detectable. e13a2 (b2a2) and O75I4(P3I9) IS: 0.023%. In MMR   10/23/2020 Pathology Results   BCR ABL is detectable. e13a2 (b2a2) and J18A4(Z6S0) IS: 0.0643%. In MMR   01/22/2021 Pathology Results   BCR ABL is detectable. e13a2 (b2a2) and Y30Z6(W1U9) IS: 0.027%. In MMR   05/25/2021 Pathology Results   BCR ABL is detectable. e13a2 (b2a2) and N23F5(D3U2) IS: 0.0091%. In MMR   08/25/2021 Pathology Results   BCR ABL is detectable. e13a2 (b2a2) and G25K2(H0W2) IS: 0.0039%. In MMR   12/13/2021 Pathology Results   BCR/ABL undetectable. In MMR     PHYSICAL EXAMINATION: ECOG PERFORMANCE STATUS: 1 - Symptomatic but completely ambulatory  Vitals:   02/15/22 1206  BP: 125/61  Pulse: 79  Resp: 18  SpO2: 98%   Filed Weights   02/15/22 1206  Weight: 172 lb (78 kg)    GENERAL:alert, no distress and comfortable SKIN: skin color, texture, turgor are normal, no rashes or significant lesions EYES: normal, Conjunctiva are pink and non-injected, sclera clear OROPHARYNX:no exudate, no erythema and lips, buccal mucosa, and tongue normal  NECK: supple, thyroid normal size, non-tender, without nodularity LYMPH:  no palpable lymphadenopathy in the  cervical, axillary or inguinal LUNGS: clear to auscultation and percussion with normal breathing effort HEART: regular rate & rhythm and no murmurs and no lower extremity edema ABDOMEN:abdomen soft, non-tender and normal bowel sounds Musculoskeletal:no cyanosis of digits and no clubbing  NEURO: alert & oriented x 3 with fluent speech, no focal motor/sensory deficits  LABORATORY DATA:  I have reviewed the data as listed    Component Value Date/Time   NA 143 02/03/2022 0903   NA 142 06/08/2017 0832   K 4.1 02/03/2022 0903   K 3.9 06/08/2017 0832   CL 109 (H) 02/03/2022 0903   CL 110 (H) 11/14/2012 0918   CO2 22 02/03/2022 0903  CO2 25 06/08/2017 0832   GLUCOSE 90 02/03/2022 0903   GLUCOSE 96 12/29/2021 1045   GLUCOSE 98 06/08/2017 0832   GLUCOSE 108 (H) 11/14/2012 0918   BUN 10 02/03/2022 0903   BUN 11.6 06/08/2017 0832   CREATININE 0.82 02/03/2022 0903   CREATININE 1.13 01/22/2021 0840   CREATININE 0.90 05/31/2019 0837   CREATININE 0.9 06/08/2017 0832   CALCIUM 8.9 02/03/2022 0903   CALCIUM 8.6 06/08/2017 0832   PROT 6.8 02/03/2022 0903   PROT 6.7 06/08/2017 0832   ALBUMIN 4.1 02/03/2022 0903   ALBUMIN 3.4 (L) 06/08/2017 0832   AST 11 02/03/2022 0903   AST 13 (L) 01/22/2021 0840   AST 13 06/08/2017 0832   ALT 12 02/03/2022 0903   ALT 14 01/22/2021 0840   ALT 7 06/08/2017 0832   ALKPHOS 74 02/03/2022 0903   ALKPHOS 96 06/08/2017 0832   BILITOT 0.7 02/03/2022 0903   BILITOT 0.3 01/22/2021 0840   BILITOT 0.44 06/08/2017 0832   GFRNONAA >60 12/29/2021 1045   GFRNONAA >60 01/22/2021 0840   GFRNONAA 83 05/31/2019 0837   GFRAA >60 04/24/2020 0842   GFRAA 96 05/31/2019 0837    No results found for: "SPEP", "UPEP"  Lab Results  Component Value Date   WBC 6.4 02/03/2022   NEUTROABS 6.1 12/13/2021   HGB 10.1 (L) 02/03/2022   HCT 30.5 (L) 02/03/2022   MCV 90 02/03/2022   PLT 232 02/03/2022      Chemistry      Component Value Date/Time   NA 143 02/03/2022 0903    NA 142 06/08/2017 0832   K 4.1 02/03/2022 0903   K 3.9 06/08/2017 0832   CL 109 (H) 02/03/2022 0903   CL 110 (H) 11/14/2012 0918   CO2 22 02/03/2022 0903   CO2 25 06/08/2017 0832   BUN 10 02/03/2022 0903   BUN 11.6 06/08/2017 0832   CREATININE 0.82 02/03/2022 0903   CREATININE 1.13 01/22/2021 0840   CREATININE 0.90 05/31/2019 0837   CREATININE 0.9 06/08/2017 0832      Component Value Date/Time   CALCIUM 8.9 02/03/2022 0903   CALCIUM 8.6 06/08/2017 0832   ALKPHOS 74 02/03/2022 0903   ALKPHOS 96 06/08/2017 0832   AST 11 02/03/2022 0903   AST 13 (L) 01/22/2021 0840   AST 13 06/08/2017 0832   ALT 12 02/03/2022 0903   ALT 14 01/22/2021 0840   ALT 7 06/08/2017 0832   BILITOT 0.7 02/03/2022 0903   BILITOT 0.3 01/22/2021 0840   BILITOT 0.44 06/08/2017 0832       RADIOGRAPHIC STUDIES: I have reviewed his recent CT imaging with the patient and his wife I have personally reviewed the radiological images as listed and agreed with the findings in the report.

## 2022-02-24 ENCOUNTER — Ambulatory Visit (INDEPENDENT_AMBULATORY_CARE_PROVIDER_SITE_OTHER): Payer: Medicare HMO | Admitting: Family Medicine

## 2022-02-24 VITALS — BP 130/68 | HR 82 | Temp 98.3°F | Ht 69.0 in | Wt 173.0 lb

## 2022-02-24 DIAGNOSIS — Z8679 Personal history of other diseases of the circulatory system: Secondary | ICD-10-CM | POA: Diagnosis not present

## 2022-02-24 DIAGNOSIS — I4891 Unspecified atrial fibrillation: Secondary | ICD-10-CM

## 2022-02-24 DIAGNOSIS — M791 Myalgia, unspecified site: Secondary | ICD-10-CM

## 2022-02-24 DIAGNOSIS — I509 Heart failure, unspecified: Secondary | ICD-10-CM | POA: Diagnosis not present

## 2022-02-24 DIAGNOSIS — I429 Cardiomyopathy, unspecified: Secondary | ICD-10-CM

## 2022-02-24 NOTE — Progress Notes (Signed)
Subjective:    Patient ID: Anthony Wolfe, male    DOB: 1945/09/30, 76 y.o.   MRN: [id number] Patient was admitted to the hospital in May due to a left-sided pleural effusion.  He had recurrent right-sided pleural effusions and eventually underwent pleurodesis.  This was attributed to his cancer medication.  He had done well for several years but recently had a left-sided pleural effusion that required thoracentesis.  This is also been attributed to his cancer medication so his oncologist recently reduced the dose in hopes of preventing recurrent pleural effusions.  He states that his breathing is doing better.  He also underwent cardioversion in May and is currently in normal sinus rhythm.  He denies any chest pain but he does report severe muscle aches all over his body.  He complains of pain in his hips and thighs and shoulders and arms.  He denies any fever or chills or headaches. 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 difficile colitis 06/16/2014   CAD (coronary artery disease)    a. s/p 3v CABG 2010. b. NSTEMI 05/2014 in setting of VT. Cath impressions: "Recent IMI with occluded SVG to RCA. Has Right to Right collaterals and collaterals from septal and OM. EF 40%."   Carotid disease, bilateral ([place])    a. Right CEA 2002; known occluded left carotid. b. Duplex 06/2014: known occluded LICA, stable 7-82% RICA s/p CEA.   Chronic systolic CHF (congestive heart failure) (HCC)    CML (chronic myelocytic leukemia) ([place]) 05/09/2012   Depression    Emphysema of lung (HCC)    HTN (hypertension)    Hyperlipidemia    Ischemic cardiomyopathy    a. Prior EF 36%. b. 2014: 50%. c. 05/2014: EF 35-40% by echo, 40% with inf HK by cath.   NSTEMI (non-ST elevation myocardial infarction) ([place]) 05/2014   Pleural effusion 05/2014   Pneumonia  05/23/2014   Presence of permanent cardiac pacemaker    Prostate hypertrophy    PVC's (premature ventricular contractions)    a. PVC's with prior Holter showing PVC load of 22%.   Shortness of breath dyspnea    Stroke ([place]) 01   TIA (transient ischemic attack)    ASPVD, S./P. right CEA, 2002, and known occluded left carotid   Ventricular tachycardia ([place])    a. Admitted with VT 05/2013 - EPS with inducible sustained monomorphic VT; s/p Medtronic ICD 05/21/14.   Past Surgical History:  Procedure Laterality Date   BACK SURGERY     CARDIOVERSION N/A 01/04/2022   Procedure: CARDIOVERSION;  Surgeon: Elouise Munroe, MD;  Location: [facility];  Service: Cardiovascular;  Laterality: N/A;   CAROTID ENDARTERECTOMY Right 01   carotidectomy  2003   right side   COLONOSCOPY  07/2010   CORONARY ARTERY BYPASS GRAFT  02/2009   3 vessel   ELECTROPHYSIOLOGIC STUDY  05/21/14   ELECTROPHYSIOLOGY STUDY N/A 05/21/2014   Procedure: ELECTROPHYSIOLOGY STUDY;  Surgeon: Deboraha Sprang, MD;  Location[facility] CATH LAB;  Service: Cardiovascular;  Laterality: N/A;   EP IMPLANTABLE DEVICE  05/21/14   single chamber Metronic ICD   IR THORACENTESIS ASP PLEURAL SPACE W/IMG GUIDE  12/07/2021   LEFT HEART CATHETERIZATION WITH CORONARY/GRAFT ANGIOGRAM N/A 05/19/2014   Procedure: LEFT HEART CATHETERIZATION WITH Beatrix Fetters;  Surgeon: Josue Hector, MD;  Location: [facility] CATH LAB;  Service: Cardiovascular;  Laterality: N/A;   PLEURAL BIOPSY N/A 11/27/2014   Procedure: PLEURAL BIOPSY;  Surgeon: [name], MD;  Location: [place];  Service: Thoracic;  Laterality: N/A;   PLEURAL EFFUSION DRAINAGE Right 11/27/2014   Procedure: DRAINAGE OF PLEURAL EFFUSION;  Surgeon: [name], MD;  Location: [place];  Service: Thoracic;  Laterality: Right;   TALC PLEURODESIS Right 11/27/2014   Procedure: Pietro Cassis;  Surgeon: [name], MD;  Location: [place];  Service: Thoracic;  Laterality: Right;    VIDEO ASSISTED THORACOSCOPY Right 11/27/2014   Procedure: RIGHT VIDEO ASSISTED THORACOSCOPY;  Surgeon: [name], MD;  Location: [place];  Service: Thoracic;  Laterality: Right;   Current Outpatient Medications on File Prior to Visit  Medication Sig Dispense Refill   albuterol (VENTOLIN HFA) 108 (90 Base) MCG/ACT inhaler INHALE 2 PUFFS BY MOUTH EVERY 6 HOURS AS NEEDED FOR WHEEZING FOR SHORTNESS OF BREATH 18 g 11   apixaban (ELIQUIS) 5 MG TABS tablet Take 1 tablet (5 mg total) by mouth 2 (two) times daily. 60 tablet 6   atorvastatin (LIPITOR) 80 MG tablet TAKE 1 TABLET BY MOUTH ONCE DAILY AT  6  PM 90 tablet 3   carvedilol (COREG) 25 MG tablet Take 1 tablet (25 mg total) by mouth 2 (two) times daily. 180 tablet 3   clopidogrel (PLAVIX) 75 MG tablet Take 1 tablet by mouth once daily 90 tablet 3   diphenoxylate-atropine (LOMOTIL) 2.5-0.025 MG tablet Take 1 tablet by mouth 4 (four) times daily as needed for diarrhea or loose stools. 30 tablet 0   empagliflozin (JARDIANCE) 10 MG TABS tablet Take 1 tablet (10 mg total) by mouth daily before breakfast. 30 tablet 11   ezetimibe (ZETIA) 10 MG tablet Take 1 tablet (10 mg total) by mouth daily. 90 tablet 3   furosemide (LASIX) 20 MG tablet Take 1 tablet (20 mg total) by mouth as needed for fluid or edema. (Patient taking differently: Take 40 mg by mouth daily.) 90 tablet 1   losartan (COZAAR) 25 MG tablet Take 1 tablet (25 mg total) by mouth at bedtime. 90 tablet 3   meclizine (ANTIVERT) 25 MG tablet Take 1 tablet (25 mg total) by mouth 3 (three) times daily as needed for dizziness. 30 tablet 0   Naphazoline HCl (CLEAR EYES OP) Place 1 drop into both eyes daily as needed (Runny eyes).     nilotinib (TASIGNA) 50 MG capsule Take 2 capsules (200 mg total) by mouth every 12 hours 120 capsule 3   sildenafil (VIAGRA) 100 MG tablet Take 0.5-1 tablets (50-100 mg total) by mouth daily as needed for erectile dysfunction. 5 tablet 11   Tiotropium  Bromide-Olodaterol (STIOLTO RESPIMAT) 2.5-2.5 MCG/ACT AERS Inhale 2 puffs into the lungs every morning.     Xylometazoline HCl (4-WAY NASAL SPRAY NA) Place 1 spray into both nostrils daily as needed (Congestion).     No current facility-administered medications on file prior to visit.   Allergies  Allergen Reactions   Breztri Aerosphere [Budeson-Glycopyrrol-Formoterol] Swelling    Swelling of mouth tongue    Norvasc [Amlodipine Besylate] Rash    rash   Sulfa Antibiotics Hives    Hives    Social History   Socioeconomic History   Marital status: Married    Spouse name: Not on file   Number of children: 2   Years of education: Not on file   Highest education level: Not on file  Occupational  History    Comment: retired Development worker, international aid  Tobacco Use   Smoking status: Former    Packs/day: 3.00    Years: 45.00    Total pack years: 135.00    Types: Cigarettes    Quit date: 08/08/1997    Years since quitting: 24.5    Passive exposure: Past   Smokeless tobacco: Former  Scientific laboratory technician Use: Never used  Substance and Sexual Activity   Alcohol use: No    Alcohol/week: 0.0 standard drinks of alcohol   Drug use: No   Sexual activity: Yes  Other Topics Concern   Not on file  Social History Narrative   Not on file   Social Determinants of Health   Financial Resource Strain: Low Risk  (11/11/2021)   Overall Financial Resource Strain (CARDIA)    Difficulty of Paying Living Expenses: Not hard at all  Food Insecurity: No Food Insecurity (11/11/2021)   Hunger Vital Sign    Worried About Running Out of Food in the Last Year: Never true    Ran Out of Food in the Last Year: Never true  Transportation Needs: No Transportation Needs (11/11/2021)   PRAPARE - Hydrologist (Medical): No    Lack of Transportation (Non-Medical): No  Physical Activity: Insufficiently Active (11/11/2021)   Exercise Vital Sign    Days of Exercise per Week: 3 days    Minutes of Exercise per  Session: 30 min  Stress: No Stress Concern Present (11/11/2021)   [place]    Feeling of Stress : Not at all  Social Connections: [place] (11/11/2021)   Social Connection and Isolation Panel [NHANES]    Frequency of Communication with Friends and Family: More than three times a week    Frequency of Social Gatherings with Friends and Family: More than three times a week    Attends Religious Services: More than 4 times per year    Active Member of [employer] or Organizations: Yes    Attends Archivist Meetings: More than 4 times per year    Marital Status: Married  Human resources officer Violence: Not At Risk (11/11/2021)   Humiliation, Afraid, Rape, and Kick questionnaire    Fear of Current or Ex-Partner: No    Emotionally Abused: No    Physically Abused: No    Sexually Abused: No      Review of Systems  Gastrointestinal:  Positive for diarrhea.  All other systems reviewed and are negative.      Objective:   Physical Exam Vitals reviewed.  Constitutional:      General: He is not in acute distress.    Appearance: Normal appearance. He is well-developed. He is not ill-appearing, toxic-appearing or diaphoretic.  Cardiovascular:     Rate and Rhythm: Normal rate and regular rhythm.     Heart sounds: Normal heart sounds. No murmur heard. Pulmonary:     Effort: Pulmonary effort is normal. No tachypnea, accessory muscle usage, prolonged expiration or respiratory distress.     Breath sounds: Rales present. No wheezing.  Abdominal:     General: Bowel sounds are normal. There is no distension.     Palpations: Abdomen is soft. There is no mass.     Tenderness: There is no abdominal tenderness. There is no guarding or rebound.  Musculoskeletal:     Right lower leg: No edema.     Left lower leg: No edema.  Neurological:  Mental Status: He is alert.           Assessment & Plan:  Myalgia - Plan: CBC  with Differential/Platelet, Sedimentation rate, CK  History of ASCVD  Congestive heart failure with cardiomyopathy (HCC)  Atrial fibrillation, unspecified type ([place]) Overall the patient seems to be doing well but he now has severe myalgias all over his body.  He attributes this to when he just added Zetia to his Lipitor.  I have asked him to hold his Lipitor for a week and see if the myalgias begin to improve.  Check a CK level, sedimentation rate, and a CBC.  I want to leave him on a statin if possible however I want to see if the recent addition to Zetia has exacerbated myalgias due to his statin.  His shortness of breath appear to be due to a left-sided pleural effusion caused by his cancer medication coupled with his atrial fibrillation.  After thoracentesis and cardioversion, his shortness of breath is back to his baseline.  His blood pressure today is well controlled.  Await see his response to temporarily holding his Lipitor

## 2022-02-25 LAB — CBC WITH DIFFERENTIAL/PLATELET
Absolute Monocytes: 795 cells/uL (ref 200–950)
Basophils Absolute: 50 cells/uL (ref 0–200)
Basophils Relative: 0.7 %
Eosinophils Absolute: 206 cells/uL (ref 15–500)
Eosinophils Relative: 2.9 %
HCT: 28.7 % — ABNORMAL LOW (ref 38.5–50.0)
Hemoglobin: 9.6 g/dL — ABNORMAL LOW (ref 13.2–17.1)
Lymphs Abs: 1072 cells/uL (ref 850–3900)
MCH: 30.1 pg (ref 27.0–33.0)
MCHC: 33.4 g/dL (ref 32.0–36.0)
MCV: 90 fL (ref 80.0–100.0)
MPV: 10.7 fL (ref 7.5–12.5)
Monocytes Relative: 11.2 %
Neutro Abs: 4977 cells/uL (ref 1500–7800)
Neutrophils Relative %: 70.1 %
Platelets: 232 10*3/uL (ref 140–400)
RBC: 3.19 10*6/uL — ABNORMAL LOW (ref 4.20–5.80)
RDW: 17.2 % — ABNORMAL HIGH (ref 11.0–15.0)
Total Lymphocyte: 15.1 %
WBC: 7.1 10*3/uL (ref 3.8–10.8)

## 2022-02-25 LAB — CK: Total CK: 27 U/L — ABNORMAL LOW (ref 44–196)

## 2022-02-25 LAB — SEDIMENTATION RATE: Sed Rate: 34 mm/h — ABNORMAL HIGH (ref 0–20)

## 2022-02-28 ENCOUNTER — Ambulatory Visit (INDEPENDENT_AMBULATORY_CARE_PROVIDER_SITE_OTHER): Payer: Medicare HMO

## 2022-02-28 DIAGNOSIS — I255 Ischemic cardiomyopathy: Secondary | ICD-10-CM

## 2022-02-28 LAB — CUP PACEART REMOTE DEVICE CHECK
Battery Remaining Longevity: 36 mo
Battery Voltage: 2.97 V
Brady Statistic RV Percent Paced: 0.04 %
Date Time Interrogation Session: 20230724043723
HighPow Impedance: 49 Ohm
Implantable Lead Implant Date: 20151014
Implantable Lead Location: 753860
Implantable Pulse Generator Implant Date: 20151014
Lead Channel Impedance Value: 342 Ohm
Lead Channel Impedance Value: 399 Ohm
Lead Channel Pacing Threshold Amplitude: 0.625 V
Lead Channel Pacing Threshold Pulse Width: 0.4 ms
Lead Channel Sensing Intrinsic Amplitude: 2.375 mV
Lead Channel Sensing Intrinsic Amplitude: 2.375 mV
Lead Channel Setting Pacing Amplitude: 2 V
Lead Channel Setting Pacing Pulse Width: 0.4 ms
Lead Channel Setting Sensing Sensitivity: 0.3 mV

## 2022-03-15 ENCOUNTER — Other Ambulatory Visit: Payer: Self-pay

## 2022-03-15 ENCOUNTER — Inpatient Hospital Stay: Payer: Medicare HMO | Attending: Hematology and Oncology

## 2022-03-15 DIAGNOSIS — Z79899 Other long term (current) drug therapy: Secondary | ICD-10-CM | POA: Insufficient documentation

## 2022-03-15 DIAGNOSIS — C9210 Chronic myeloid leukemia, BCR/ABL-positive, not having achieved remission: Secondary | ICD-10-CM | POA: Diagnosis not present

## 2022-03-15 DIAGNOSIS — D6481 Anemia due to antineoplastic chemotherapy: Secondary | ICD-10-CM | POA: Diagnosis not present

## 2022-03-15 DIAGNOSIS — C9211 Chronic myeloid leukemia, BCR/ABL-positive, in remission: Secondary | ICD-10-CM | POA: Diagnosis not present

## 2022-03-15 LAB — COMPREHENSIVE METABOLIC PANEL
ALT: 6 U/L (ref 0–44)
AST: 10 U/L — ABNORMAL LOW (ref 15–41)
Albumin: 3.8 g/dL (ref 3.5–5.0)
Alkaline Phosphatase: 59 U/L (ref 38–126)
Anion gap: 4 — ABNORMAL LOW (ref 5–15)
BUN: 15 mg/dL (ref 8–23)
CO2: 29 mmol/L (ref 22–32)
Calcium: 8.8 mg/dL — ABNORMAL LOW (ref 8.9–10.3)
Chloride: 110 mmol/L (ref 98–111)
Creatinine, Ser: 0.9 mg/dL (ref 0.61–1.24)
GFR, Estimated: >60 mL/min (ref >60)
Glucose, Bld: 105 mg/dL — ABNORMAL HIGH (ref 70–99)
Potassium: 4.1 mmol/L (ref 3.5–5.1)
Sodium: 143 mmol/L (ref 135–145)
Total Bilirubin: 0.6 mg/dL (ref 0.3–1.2)
Total Protein: 7 g/dL (ref 6.5–8.1)

## 2022-03-15 LAB — CBC WITH DIFFERENTIAL/PLATELET
Abs Immature Granulocytes: 0.03 10*3/uL (ref 0.00–0.07)
Basophils Absolute: 0.1 10*3/uL (ref 0.0–0.1)
Basophils Relative: 2 %
Eosinophils Absolute: 0.2 10*3/uL (ref 0.0–0.5)
Eosinophils Relative: 3 %
HCT: 33.3 % — ABNORMAL LOW (ref 39.0–52.0)
Hemoglobin: 10.8 g/dL — ABNORMAL LOW (ref 13.0–17.0)
Immature Granulocytes: 0 %
Lymphocytes Relative: 13 %
Lymphs Abs: 0.9 10*3/uL (ref 0.7–4.0)
MCH: 29.7 pg (ref 26.0–34.0)
MCHC: 32.4 g/dL (ref 30.0–36.0)
MCV: 91.5 fL (ref 80.0–100.0)
Monocytes Absolute: 0.7 10*3/uL (ref 0.1–1.0)
Monocytes Relative: 9 %
Neutro Abs: 5.3 10*3/uL (ref 1.7–7.7)
Neutrophils Relative %: 73 %
Platelets: 226 10*3/uL (ref 150–400)
RBC: 3.64 MIL/uL — ABNORMAL LOW (ref 4.22–5.81)
RDW: 17.6 % — ABNORMAL HIGH (ref 11.5–15.5)
WBC: 7.2 10*3/uL (ref 4.0–10.5)
nRBC: 0 % (ref 0.0–0.2)

## 2022-03-18 ENCOUNTER — Telehealth: Payer: Self-pay

## 2022-03-18 NOTE — Telephone Encounter (Signed)
Pt came into clinic to get samples for Eliquis '5mg'$  -give pt 2 boxes  Also drop Pt assistance form to be completed per Dr. Dennard Schaumann

## 2022-03-21 LAB — BCR/ABL

## 2022-03-25 ENCOUNTER — Encounter: Payer: Self-pay | Admitting: Hematology and Oncology

## 2022-03-25 ENCOUNTER — Inpatient Hospital Stay (HOSPITAL_BASED_OUTPATIENT_CLINIC_OR_DEPARTMENT_OTHER): Payer: Medicare HMO | Admitting: Hematology and Oncology

## 2022-03-25 ENCOUNTER — Other Ambulatory Visit: Payer: Self-pay

## 2022-03-25 DIAGNOSIS — C9210 Chronic myeloid leukemia, BCR/ABL-positive, not having achieved remission: Secondary | ICD-10-CM

## 2022-03-25 DIAGNOSIS — D6481 Anemia due to antineoplastic chemotherapy: Secondary | ICD-10-CM | POA: Diagnosis not present

## 2022-03-25 DIAGNOSIS — T451X5A Adverse effect of antineoplastic and immunosuppressive drugs, initial encounter: Secondary | ICD-10-CM

## 2022-03-25 DIAGNOSIS — Z79899 Other long term (current) drug therapy: Secondary | ICD-10-CM | POA: Diagnosis not present

## 2022-03-25 NOTE — Assessment & Plan Note (Signed)
This is likely anemia of chronic disease. The patient denies recent history of bleeding such as epistaxis, hematuria or hematochezia. He is asymptomatic from the anemia. We will observe for now.  

## 2022-03-25 NOTE — Assessment & Plan Note (Signed)
We have reviewed recent blood work He is still in complete remission with major molecular response For now, he will continue his dose at 100 mg twice daily 

## 2022-03-25 NOTE — Progress Notes (Signed)
Anthony Wolfe OFFICE PROGRESS NOTE  Patient Care Team: [name], MD as PCP - General (Family Medicine) Early Osmond, MD as PCP - Cardiology (Cardiology) [name]., MD as Attending Physician (Cardiology) [name], MD (Family Medicine) [name], MD (Cardiothoracic Surgery) Early, Arvilla Meres, MD as Attending Physician (Vascular Surgery) [name], MD as Consulting Physician (Hematology and Oncology) Deboraha Sprang, MD as Consulting Physician (Cardiology) [name], [facility] as Pharmacist (Pharmacist) [name], [facility] as Pharmacist (Pharmacist)  ASSESSMENT & PLAN:  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 The patient denies recent history of bleeding such as epistaxis, hematuria or hematochezia. He is asymptomatic from the anemia. We will observe for now.  No orders of the defined types were placed in this encounter.   All questions were answered. The patient knows to call the clinic with any problems, questions or concerns. The total time spent in the appointment was 20 minutes encounter with patients including review of chart and various tests results, discussions about plan of care and coordination of care plan   [name], MD 03/25/2022 5:08 PM  INTERVAL HISTORY: Please see below for problem oriented charting. he returns for treatment follow-up on Tasigna for CML He is compliant taking medications as directed Denies recent shortness of breath or cough  REVIEW OF SYSTEMS:   Constitutional: Denies fevers, chills or abnormal weight loss Eyes: Denies blurriness of vision Ears, nose, mouth, throat, and face: Denies mucositis or sore throat Respiratory: Denies cough, dyspnea or  wheezes Cardiovascular: Denies palpitation, chest discomfort or lower extremity swelling Gastrointestinal:  Denies nausea, heartburn or change in bowel habits Skin: Denies abnormal skin rashes Lymphatics: Denies new lymphadenopathy or easy bruising Neurological:Denies numbness, tingling or new weaknesses Behavioral/Psych: Mood is stable, no new changes  All other systems were reviewed with the patient and are negative.  I have reviewed the past medical history, past surgical history, social history and family history with the patient and they are unchanged from previous note.  ALLERGIES:  is allergic to breztri aerosphere [budeson-glycopyrrol-formoterol], norvasc [amlodipine besylate], and sulfa antibiotics.  MEDICATIONS:  Current Outpatient Medications  Medication Sig Dispense Refill   albuterol (VENTOLIN HFA) 108 (90 Base) MCG/ACT inhaler INHALE 2 PUFFS BY MOUTH EVERY 6 HOURS AS NEEDED FOR WHEEZING FOR SHORTNESS OF BREATH 18 g 11   apixaban (ELIQUIS) 5 MG TABS tablet Take 1 tablet (5 mg total) by mouth 2 (two) times daily. 60 tablet 6   atorvastatin (LIPITOR) 80 MG tablet TAKE 1 TABLET BY MOUTH ONCE DAILY AT  6  PM 90 tablet 3   carvedilol (COREG) 25 MG tablet Take 1 tablet (25 mg total) by mouth 2 (two) times daily. 180 tablet 3   clopidogrel (PLAVIX) 75 MG tablet Take 1 tablet by mouth once daily 90 tablet 3   diphenoxylate-atropine (LOMOTIL) 2.5-0.025 MG tablet Take 1 tablet by mouth 4 (four) times daily as needed for diarrhea or loose stools. 30 tablet 0   empagliflozin (JARDIANCE) 10 MG TABS tablet Take 1 tablet (10 mg total) by mouth daily before breakfast. 30 tablet 11   ezetimibe (ZETIA) 10 MG tablet Take 1 tablet (10 mg total) by mouth daily. 90 tablet 3   furosemide (LASIX) 20 MG tablet Take 1  tablet (20 mg total) by mouth as needed for fluid or edema. (Patient taking differently: Take 40 mg by mouth daily.) 90 tablet 1   losartan (COZAAR) 25 MG tablet Take 1 tablet (25 mg total)  by mouth at bedtime. 90 tablet 3   meclizine (ANTIVERT) 25 MG tablet Take 1 tablet (25 mg total) by mouth 3 (three) times daily as needed for dizziness. 30 tablet 0   Naphazoline HCl (CLEAR EYES OP) Place 1 drop into both eyes daily as needed (Runny eyes).     nilotinib (TASIGNA) 50 MG capsule Take 2 capsules (200 mg total) by mouth every 12 hours 120 capsule 3   sildenafil (VIAGRA) 100 MG tablet Take 0.5-1 tablets (50-100 mg total) by mouth daily as needed for erectile dysfunction. 5 tablet 11   Tiotropium Bromide-Olodaterol (STIOLTO RESPIMAT) 2.5-2.5 MCG/ACT AERS Inhale 2 puffs into the lungs every morning.     Xylometazoline HCl (4-WAY NASAL SPRAY NA) Place 1 spray into both nostrils daily as needed (Congestion).     No current facility-administered medications for this visit.    SUMMARY OF ONCOLOGIC HISTORY: Oncology History  Chronic myelogenous leukemia ([place])  05/04/2012 Bone Marrow Biopsy   BM confirmed diagnosis of CML in Chronic phase. BCR/ABL by PCR detected abnormalitis with b2a2 & b3a2 subtypes   05/09/2012 - 03/16/2015 Chemotherapy   He was started on treatment with Dasatinib 100 mg daily   06/06/2012 Adverse Reaction   Dose of medication was reduced to 50 mg daily due to pancytopenia   01/24/2013 Progression   Patient was noted to have elevated blood count which and was subsequently found to be noncompliant to treatment. The patient has not been on treatment for several months because his prescription ran out. He was restarted back on treatment   01/16/2014 Progression   Bcr/ABL by PCR is worse. Dose of Dasatinib was increased to 100 mg.   02/26/2014 Tumor Marker   Blood work for ABL kinase mutation was negative.   10/29/2014 Tumor Marker   BCR/ABL b2a2 & b3a2 0.29%, IS 0.1624%, not in MMR yet but improving   11/05/2014 Adverse Reaction   He had thoracentesis due to pleural effusion   11/27/2014 Surgery   He underwent right video-assisted thoracoscopy, Drainage of pleural  effusion, Pleural biopsy, Diaphragm biopsy, Lung biopsy & [place] pleurodesis   01/30/2015 Tumor Marker   BCR/ABL b2a2 & b3a2 0.78%, IS 0.4368%, not in MMR    03/11/2015 Pathology Results   BCR/ABL b2a2 0.19%, IS 0.1064%, not in MMR    03/16/2015 Adverse Reaction   Dasatinib was stopped due to recurrent pleural effusion   03/28/2015 - 11/06/2017 Chemotherapy   He started on Bosutinib   03/30/2015 Procedure   He has therapeutic thoracentesis of the right lung with 1 liter of fluid removed   03/30/2015 Adverse Reaction   Bosutinib is placed on hold, to be restart on 8/25 at 250 mg due to severe diarrhea   04/28/2015 Tumor Marker   BCR/ABL b2a2 0.06%, IS 0.0336%, in MMR    06/10/2015 Tumor Marker   BCR/ABL b2a2 0.14%, IS 0.1162%, not in MMR    08/14/2015 Pathology Results   BCR/ABL b2a2 0.02%, IS 0.0166%, In MMR    11/09/2015 Tumor Marker   BCR/ABL b2a2 0.005%, IS 0.0042%, In MMR    02/08/2016 Tumor Marker   BCR/ABL undetectable. In MMR   06/13/2016 Tumor Marker   BCR/ABL undetectable. In MMR   12/06/2016 Pathology Results   BCR/ABL undetectable. In MMR   06/08/2017  Pathology Results   BCR/ABL undetectable. In MMR   09/08/2017 Pathology Results   BCR/ABL undetectable. In MMR   12/11/2017 Pathology Results   BCR/ABL undetectable. In MMR   03/19/2018 Pathology Results   BCR/ABL undetectable. In MMR   06/12/2018 Pathology Results   BCR/ABL undetectable. In MMR   09/06/2018 Pathology Results   BCR ABL is detectable. e13a2 (b2a2) and [id number]) IS: 0.009%   09/26/2018 -  Chemotherapy   The patient had Tasigna    11/21/2018 Pathology Results   BCR/ABL undetectable. In MMR   03/07/2019 Pathology Results   BCR/ABL undetectable. In MMR   06/17/2019 Pathology Results   BCR/ABL undetectable. In MMR   10/14/2019 Pathology Results   BCR ABL is detectable. e13a2 (b2a2) and O37C5(Y8F0) IS: 0.0281%. In MMR   01/14/2020 Pathology Results   BCR ABL is detectable. e13a2 (b2a2) and Y77A1(O8N8) IS:  0.023%. In MMR   10/23/2020 Pathology Results   BCR ABL is detectable. e13a2 (b2a2) and M76H2(C9O7) IS: 0.0643%. In MMR   01/22/2021 Pathology Results   BCR ABL is detectable. e13a2 (b2a2) and S96G8(Z6O2) IS: 0.027%. In MMR   05/25/2021 Pathology Results   BCR ABL is detectable. e13a2 (b2a2) and H47M5(Y6T0) IS: 0.0091%. In MMR   08/25/2021 Pathology Results   BCR ABL is detectable. e13a2 (b2a2) and P54S5(K8L2) IS: 0.0039%. In MMR   12/13/2021 Pathology Results   BCR/ABL undetectable. In MMR   03/15/2022 Pathology Results   BCR/ABL undetectable. In MMR     PHYSICAL EXAMINATION: ECOG PERFORMANCE STATUS: 0 - Asymptomatic  Vitals:   03/25/22 0833  BP: (!) 157/70  Pulse: 80  Resp: 18  Temp: 98.4 F (36.9 C)  SpO2: 99%   Filed Weights   03/25/22 0833  Weight: 177 lb 3.2 oz (80.4 kg)    GENERAL:alert, no distress and comfortable SKIN: skin color, texture, turgor are normal, no rashes or significant lesions EYES: normal, Conjunctiva are pink and non-injected, sclera clear OROPHARYNX:no exudate, no erythema and lips, buccal mucosa, and tongue normal  NECK: supple, thyroid normal size, non-tender, without nodularity LYMPH:  no palpable lymphadenopathy in the cervical, axillary or inguinal LUNGS: clear to auscultation and percussion with normal breathing effort HEART: regular rate & rhythm and no murmurs and no lower extremity edema ABDOMEN:abdomen soft, non-tender and normal bowel sounds Musculoskeletal:no cyanosis of digits and no clubbing  NEURO: alert & oriented x 3 with fluent speech, no focal motor/sensory deficits  LABORATORY DATA:  I have reviewed the data as listed    Component Value Date/Time   NA 143 03/15/2022 0850   NA 143 02/03/2022 0903   NA 142 06/08/2017 0832   K 4.1 03/15/2022 0850   K 3.9 06/08/2017 0832   CL 110 03/15/2022 0850   CL 110 (H) 11/14/2012 0918   CO2 29 03/15/2022 0850   CO2 25 06/08/2017 0832   GLUCOSE 105 (H) 03/15/2022 0850   GLUCOSE  98 06/08/2017 0832   GLUCOSE 108 (H) 11/14/2012 0918   BUN 15 03/15/2022 0850   BUN 10 02/03/2022 0903   BUN 11.6 06/08/2017 0832   CREATININE 0.90 03/15/2022 0850   CREATININE 1.13 01/22/2021 0840   CREATININE 0.90 05/31/2019 0837   CREATININE 0.9 06/08/2017 0832   CALCIUM 8.8 (L) 03/15/2022 0850   CALCIUM 8.6 06/08/2017 0832   PROT 7.0 03/15/2022 0850   PROT 6.8 02/03/2022 0903   PROT 6.7 06/08/2017 0832   ALBUMIN 3.8 03/15/2022 0850   ALBUMIN 4.1 02/03/2022 0903   ALBUMIN 3.4 (L) 06/08/2017  0832   AST 10 (L) 03/15/2022 0850   AST 13 (L) 01/22/2021 0840   AST 13 06/08/2017 0832   ALT 6 03/15/2022 0850   ALT 14 01/22/2021 0840   ALT 7 06/08/2017 0832   ALKPHOS 59 03/15/2022 0850   ALKPHOS 96 06/08/2017 0832   BILITOT 0.6 03/15/2022 0850   BILITOT 0.7 02/03/2022 0903   BILITOT 0.3 01/22/2021 0840   BILITOT 0.44 06/08/2017 0832   GFRNONAA >60 03/15/2022 0850   GFRNONAA >60 01/22/2021 0840   GFRNONAA 83 05/31/2019 0837   GFRAA >60 04/24/2020 0842   GFRAA 96 05/31/2019 0837    No results found for: "SPEP", "UPEP"  Lab Results  Component Value Date   WBC 7.2 03/15/2022   NEUTROABS 5.3 03/15/2022   HGB 10.8 (L) 03/15/2022   HCT 33.3 (L) 03/15/2022   MCV 91.5 03/15/2022   PLT 226 03/15/2022      Chemistry      Component Value Date/Time   NA 143 03/15/2022 0850   NA 143 02/03/2022 0903   NA 142 06/08/2017 0832   K 4.1 03/15/2022 0850   K 3.9 06/08/2017 0832   CL 110 03/15/2022 0850   CL 110 (H) 11/14/2012 0918   CO2 29 03/15/2022 0850   CO2 25 06/08/2017 0832   BUN 15 03/15/2022 0850   BUN 10 02/03/2022 0903   BUN 11.6 06/08/2017 0832   CREATININE 0.90 03/15/2022 0850   CREATININE 1.13 01/22/2021 0840   CREATININE 0.90 05/31/2019 0837   CREATININE 0.9 06/08/2017 0832      Component Value Date/Time   CALCIUM 8.8 (L) 03/15/2022 0850   CALCIUM 8.6 06/08/2017 0832   ALKPHOS 59 03/15/2022 0850   ALKPHOS 96 06/08/2017 0832   AST 10 (L) 03/15/2022 0850    AST 13 (L) 01/22/2021 0840   AST 13 06/08/2017 0832   ALT 6 03/15/2022 0850   ALT 14 01/22/2021 0840   ALT 7 06/08/2017 0832   BILITOT 0.6 03/15/2022 0850   BILITOT 0.7 02/03/2022 0903   BILITOT 0.3 01/22/2021 0840   BILITOT 0.44 06/08/2017 0832       RADIOGRAPHIC STUDIES: I have personally reviewed the radiological images as listed and agreed with the findings in the report. CUP PACEART REMOTE DEVICE CHECK  Result Date: 02/28/2022 Scheduled remote reviewed. Normal device function.  Next remote 91 days. LA

## 2022-04-01 NOTE — Progress Notes (Signed)
Remote ICD transmission.   

## 2022-04-01 NOTE — Telephone Encounter (Signed)
Patient called to see if this paperwork is ready for pick up.  [phone]# [phone]

## 2022-04-04 NOTE — Telephone Encounter (Signed)
Eliquis Enrollment Form as been completed and put up front to be fax back to [facility]. Spoke w/pt and is aware

## 2022-04-05 ENCOUNTER — Ambulatory Visit (INDEPENDENT_AMBULATORY_CARE_PROVIDER_SITE_OTHER): Payer: Medicare HMO | Admitting: Family Medicine

## 2022-04-05 VITALS — BP 132/68 | HR 78 | Temp 97.8°F | Ht 69.0 in | Wt 178.0 lb

## 2022-04-05 DIAGNOSIS — F5105 Insomnia due to other mental disorder: Secondary | ICD-10-CM | POA: Diagnosis not present

## 2022-04-05 DIAGNOSIS — F418 Other specified anxiety disorders: Secondary | ICD-10-CM

## 2022-04-05 MED ORDER — ESCITALOPRAM OXALATE 10 MG PO TABS: 10.0000 mg | ORAL_TABLET | Freq: Every day | ORAL | 5 refills | Status: DC

## 2022-04-05 MED ORDER — ALPRAZOLAM 0.5 MG PO TABS: 0.5000 mg | ORAL_TABLET | Freq: Three times a day (TID) | ORAL | 0 refills | Status: DC | PRN

## 2022-04-05 NOTE — Progress Notes (Signed)
Subjective:    Patient ID: Anthony Wolfe, male    DOB: 1946/07/29, 76 y.o.   MRN: [id number] Patient presents today reporting dizziness.  He reports trouble sleeping.  He reports feeling anxious.  His wife states that he is extremely anxious.  She states that every time he feels the pain, he thinks it is coming from his cancer and that he is going to die.  He admits that his mind races at night and he is unable to sleep because "he is worrying."  He also reports.  He denies any suicidal ideation.  However he reports feeling no energy.  He reports poor appetite.  He reports lack of focus.  He feels that he needs something for depression and anxiety.  His wife states that sometimes he "acts stupid".  When I asked her to explain, she states that he will lay down in bed toss and turn for a few minutes then get up and watch TV for a few minutes then lay back down in bed for a few minutes and repeat this process over an hour for however for hours. 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 difficile colitis 06/16/2014   CAD (coronary artery disease)    a. s/p 3v CABG 2010. b. NSTEMI 05/2014 in setting of VT. Cath impressions: "Recent IMI with occluded SVG to RCA. Has Right to Right collaterals and collaterals from septal and OM. EF 40%."   Carotid disease, bilateral ([place])    a. Right CEA 2002; known occluded left carotid. b. Duplex 06/2014: known occluded LICA, stable 0-62% RICA s/p CEA.   Chronic systolic CHF (congestive heart failure) (HCC)    CML (chronic myelocytic leukemia) ([place]) 05/09/2012   Depression    Emphysema of lung (HCC)    HTN (hypertension)    Hyperlipidemia    Ischemic cardiomyopathy    a. Prior EF 36%. b. 2014: 50%. c. 05/2014: EF 35-40% by echo, 40% with inf HK by cath.   NSTEMI (non-ST elevation myocardial infarction)  ([place]) 05/2014   Pleural effusion 05/2014   Pneumonia 05/23/2014   Presence of permanent cardiac pacemaker    Prostate hypertrophy    PVC's (premature ventricular contractions)    a. PVC's with prior Holter showing PVC load of 22%.   Shortness of breath dyspnea    Stroke ([place]) 01   TIA (transient ischemic attack)    ASPVD, S./P. right CEA, 2002, and known occluded left carotid   Ventricular tachycardia ([place])    a. Admitted with VT 05/2013 - EPS with inducible sustained monomorphic VT; s/p Medtronic ICD 05/21/14.   Past Surgical History:  Procedure Laterality Date   BACK SURGERY     CARDIOVERSION N/A 01/04/2022   Procedure: CARDIOVERSION;  Surgeon: Elouise Munroe, MD;  Location: [facility];  Service: Cardiovascular;  Laterality: N/A;   CAROTID ENDARTERECTOMY Right 01   carotidectomy  2003   right side   COLONOSCOPY  07/2010   CORONARY ARTERY BYPASS GRAFT  02/2009   3 vessel   ELECTROPHYSIOLOGIC STUDY  05/21/14   ELECTROPHYSIOLOGY STUDY N/A 05/21/2014   Procedure: ELECTROPHYSIOLOGY STUDY;  Surgeon: Deboraha Sprang, MD;  Location[facility] C[facility] LAB;  Service: Cardiovascular;  Laterality: N/A;   EP IMPLANTABLE DEVICE  05/21/14   single chamber Metronic ICD   IR THORACENTESIS ASP  PLEURAL SPACE W/IMG GUIDE  12/07/2021   LEFT HEART CATHETERIZATION WITH CORONARY/GRAFT ANGIOGRAM N/A 05/19/2014   Procedure: LEFT HEART CATHETERIZATION WITH Beatrix Fetters;  Surgeon: Josue Hector, MD;  Location: [facility] CATH LAB;  Service: Cardiovascular;  Laterality: N/A;   PLEURAL BIOPSY N/A 11/27/2014   Procedure: PLEURAL BIOPSY;  Surgeon: [name], MD;  Location: [facility];  Service: Thoracic;  Laterality: N/A;   PLEURAL EFFUSION DRAINAGE Right 11/27/2014   Procedure: DRAINAGE OF PLEURAL EFFUSION;  Surgeon: [name], MD;  Location: [facility];  Service: Thoracic;  Laterality: Right;   TALC PLEURODESIS Right 11/27/2014   Procedure: Pietro Cassis;  Surgeon: [name], MD;   Location: [facility];  Service: Thoracic;  Laterality: Right;   VIDEO ASSISTED THORACOSCOPY Right 11/27/2014   Procedure: RIGHT VIDEO ASSISTED THORACOSCOPY;  Surgeon: [name], MD;  Location: [facility];  Service: Thoracic;  Laterality: Right;   Current Outpatient Medications on File Prior to Visit  Medication Sig Dispense Refill   albuterol (VENTOLIN HFA) 108 (90 Base) MCG/ACT inhaler INHALE 2 PUFFS BY MOUTH EVERY 6 HOURS AS NEEDED FOR WHEEZING FOR SHORTNESS OF BREATH 18 g 11   apixaban (ELIQUIS) 5 MG TABS tablet Take 1 tablet (5 mg total) by mouth 2 (two) times daily. 60 tablet 6   atorvastatin (LIPITOR) 80 MG tablet TAKE 1 TABLET BY MOUTH ONCE DAILY AT  6  PM 90 tablet 3   carvedilol (COREG) 25 MG tablet Take 1 tablet (25 mg total) by mouth 2 (two) times daily. 180 tablet 3   clopidogrel (PLAVIX) 75 MG tablet Take 1 tablet by mouth once daily 90 tablet 3   diphenoxylate-atropine (LOMOTIL) 2.5-0.025 MG tablet Take 1 tablet by mouth 4 (four) times daily as needed for diarrhea or loose stools. 30 tablet 0   empagliflozin (JARDIANCE) 10 MG TABS tablet Take 1 tablet (10 mg total) by mouth daily before breakfast. 30 tablet 11   ezetimibe (ZETIA) 10 MG tablet Take 1 tablet (10 mg total) by mouth daily. 90 tablet 3   furosemide (LASIX) 20 MG tablet Take 1 tablet (20 mg total) by mouth as needed for fluid or edema. (Patient taking differently: Take 40 mg by mouth daily.) 90 tablet 1   losartan (COZAAR) 25 MG tablet Take 1 tablet (25 mg total) by mouth at bedtime. 90 tablet 3   meclizine (ANTIVERT) 25 MG tablet Take 1 tablet (25 mg total) by mouth 3 (three) times daily as needed for dizziness. 30 tablet 0   Naphazoline HCl (CLEAR EYES OP) Place 1 drop into both eyes daily as needed (Runny eyes).     nilotinib (TASIGNA) 50 MG capsule Take 2 capsules (200 mg total) by mouth every 12 hours 120 capsule 3   sildenafil (VIAGRA) 100 MG tablet Take 0.5-1 tablets (50-100 mg total) by mouth daily as needed for  erectile dysfunction. 5 tablet 11   Tiotropium Bromide-Olodaterol (STIOLTO RESPIMAT) 2.5-2.5 MCG/ACT AERS Inhale 2 puffs into the lungs every morning. (Patient not taking: Reported on 04/05/2022)     Xylometazoline HCl (4-WAY NASAL SPRAY NA) Place 1 spray into both nostrils daily as needed (Congestion). (Patient not taking: Reported on 04/05/2022)     No current facility-administered medications on file prior to visit.   Allergies  Allergen Reactions   Breztri Aerosphere [Budeson-Glycopyrrol-Formoterol] Swelling    Swelling of mouth tongue    Norvasc [Amlodipine Besylate] Rash    rash   Sulfa Antibiotics Hives    Hives  Social History   Socioeconomic History   Marital status: Married    Spouse name: Not on file   Number of children: 2   Years of education: Not on file   Highest education level: Not on file  Occupational History    Comment: retired Development worker, international aid  Tobacco Use   Smoking status: Former    Packs/day: 3.00    Years: 45.00    Total pack years: 135.00    Types: Cigarettes    Quit date: 08/08/1997    Years since quitting: 24.6    Passive exposure: Past   Smokeless tobacco: Former  Scientific laboratory technician Use: Never used  Substance and Sexual Activity   Alcohol use: No    Alcohol/week: 0.0 standard drinks of alcohol   Drug use: No   Sexual activity: Yes  Other Topics Concern   Not on file  Social History Narrative   Not on file   Social Determinants of Health   Financial Resource Strain: Low Risk  (11/11/2021)   Overall Financial Resource Strain (CARDIA)    Difficulty of Paying Living Expenses: Not hard at all  Food Insecurity: No Food Insecurity (11/11/2021)   Hunger Vital Sign    Worried About Running Out of Food in the Last Year: Never true    [place] in the Last Year: Never true  Transportation Needs: No Transportation Needs (11/11/2021)   PRAPARE - Hydrologist (Medical): No    Lack of Transportation (Non-Medical): No   Physical Activity: Insufficiently Active (11/11/2021)   Exercise Vital Sign    Days of Exercise per Week: 3 days    Minutes of Exercise per Session: 30 min  Stress: No Stress Concern Present (11/11/2021)   [place]    Feeling of Stress : Not at all  Social Connections: [place] (11/11/2021)   Social Connection and Isolation Panel [NHANES]    Frequency of Communication with Friends and Family: More than three times a week    Frequency of Social Gatherings with Friends and Family: More than three times a week    Attends Religious Services: More than 4 times per year    Active Member of [employer] or Organizations: Yes    Attends Archivist Meetings: More than 4 times per year    Marital Status: Married  Human resources officer Violence: Not At Risk (11/11/2021)   Humiliation, Afraid, Rape, and Kick questionnaire    Fear of Current or Ex-Partner: No    Emotionally Abused: No    Physically Abused: No    Sexually Abused: No      Review of Systems  Gastrointestinal:  Positive for diarrhea.  All other systems reviewed and are negative.      Objective:   Physical Exam Vitals reviewed.  Constitutional:      General: He is not in acute distress.    Appearance: Normal appearance. He is well-developed. He is not ill-appearing, toxic-appearing or diaphoretic.  Cardiovascular:     Rate and Rhythm: Normal rate. Rhythm irregular.     Heart sounds: Normal heart sounds. No murmur heard. Pulmonary:     Effort: Pulmonary effort is normal. No tachypnea, accessory muscle usage, prolonged expiration or respiratory distress.     Breath sounds: Rales present. No wheezing.  Abdominal:     General: Bowel sounds are normal. There is no distension.     Palpations: Abdomen is soft. There is no mass.  Tenderness: There is no abdominal tenderness. There is no guarding or rebound.  Musculoskeletal:     Right lower leg: No edema.      Left lower leg: No edema.  Neurological:     Mental Status: He is alert.           Assessment & Plan:  Insomnia secondary to depression with anxiety Patient certainly has a complex past medical history.  However, I feel the majority of his symptoms are likely related to worry and anxiety at the present time.  I recommended trying Lexapro 10 mg daily and using Xanax 0.5 mg p.o. every 8 hours as needed anxiety or insomnia until the Lexapro takes effect.  I would like to recheck the patient in 4 to 6 weeks to see how he is doing.  I believe that if we can get him resting better and more relaxed, some of his somatic complaints may improve

## 2022-04-15 NOTE — Progress Notes (Signed)
Chronic Care Management Pharmacy Note  04/21/2022 Name:  Anthony Wolfe MRN:  [id number] DOB:  10-15-1945  Summary: PharmD Fu visit.  He was confused on meds for his cholesterol.  Appears he was no longer taking zetia or lipitor.  I know lipitor was held due to myalgia.  Looks like he stopped zetia as well.  Appears myalgia started after addition of zetia.    Consider resume statin due to cardiac history.    FU 90 days   Subjective: Anthony Wolfe is an 76 y.o. year old male who is a primary patient of Pickard, Cammie Mcgee, MD.  The CCM team was consulted for assistance with disease management and care coordination needs.    Engaged with patient face to face for follow up visit in response to provider referral for pharmacy case management and/or care coordination services.   Consent to Services:  The patient was given the following information about Chronic Care Management services today, agreed to services, and gave verbal consent: 1. CCM service includes personalized support from designated clinical staff supervised by the primary care provider, including individualized plan of care and coordination with other care providers 2. 24/7 contact phone numbers for assistance for urgent and routine care needs. 3. Service will only be billed when office clinical staff spend 20 minutes or more in a month to coordinate care. 4. Only one practitioner may furnish and bill the service in a calendar month. 5.The patient may stop CCM services at any time (effective at the end of the month) by phone call to the office staff. 6. The patient will be responsible for cost sharing (co-pay) of up to 20% of the service fee (after annual deductible is met). Patient agreed to services and consent obtained.  Patient Care Team: [name], MD as PCP - General (Family Medicine) Early Osmond, MD as PCP - Cardiology (Cardiology) [name]., MD as Attending Physician (Cardiology) [name], MD  (Family Medicine) [name], MD (Cardiothoracic Surgery) Early, Arvilla Meres, MD as Attending Physician (Vascular Surgery) [name], MD as Consulting Physician (Hematology and Oncology) Deboraha Sprang, MD as Consulting Physician (Cardiology) [name], [facility] as Pharmacist (Pharmacist) [name], [facility] as Pharmacist (Pharmacist)  Recent office visits:  06/03/2021 Adele Dan, MD;  My biggest concern is keeping him on the [facility].  Therefore I gave him samples to last a month and I will set him up to meet with our clinical pharmacist to see if he can qualify for any prescription assistance programs.  If not we will transition him back to valsartan.  Start the patient on Celexa 20 mg a day   02/18/2021 OV (PCP) [name], MD;  I have asked the patient to check his blood pressure at home and if his systolic blood pressures consistently in the 140s I would like to increase the strength of the Entresto.   Recent consult visits:  02/01/2021 OV (Oncology) [name], MD; no medication changes indicated.   Hospital visits:  None in previous 6 months  Objective:  Lab Results  Component Value Date   CREATININE 0.90 03/15/2022   BUN 15 03/15/2022   GFR 60.62 06/23/2014   GFRNONAA >60 03/15/2022   GFRAA >60 04/24/2020   NA 143 03/15/2022   K 4.1 03/15/2022   CALCIUM 8.8 (L) 03/15/2022   CO2 29 03/15/2022   GLUCOSE 105 (H) 03/15/2022    Lab Results  Component Value Date/Time  HGBA1C 5.1 05/31/2019 08:37 AM   HGBA1C 5.5 08/12/2013 03:33 PM   GFR 60.62 06/23/2014 03:18 PM   GFR 55.47 (L) 06/16/2014 03:23 PM    Last diabetic Eye exam: No results found for: "HMDIABEYEEXA"  Last diabetic Foot exam: No results found for: "HMDIABFOOTEX"   Lab Results  Component Value Date   CHOL 137 02/03/2022   HDL 41 02/03/2022   LDLCALC 80 02/03/2022   TRIG 83 02/03/2022   CHOLHDL 3.3 02/03/2022       Latest Ref Rng & Units 03/15/2022    8:50 AM  02/03/2022    9:03 AM 12/13/2021    8:29 AM  Hepatic Function  Total Protein 6.5 - 8.1 g/dL 7.0  6.8  6.7   Albumin 3.5 - 5.0 g/dL 3.8  4.1  3.3   AST 15 - 41 U/L _0 ALT 0 - 44 U/L _1 Alk Phosphatase 38 - 126 U/L 59  74  86   Total Bilirubin 0.3 - 1.2 mg/dL 0.6  0.7  0.7     Lab Results  Component Value Date/Time   TSH 3.250 02/03/2022 09:03 AM   TSH 2.510 03/15/2017 11:29 AM   TSH 2.03 06/02/2014 03:13 PM       Latest Ref Rng & Units 03/15/2022    8:50 AM 02/24/2022    2:46 PM 02/03/2022    9:03 AM  CBC  WBC 4.0 - 10.5 K/uL 7.2  7.1  6.4   Hemoglobin 13.0 - 17.0 g/dL 10.8  9.6  10.1   Hematocrit 39.0 - 52.0 % 33.3  28.7  30.5   Platelets 150 - 400 K/uL 226  232  232     No results found for: "VD25OH"  Clinical ASCVD: Yes  The ASCVD Risk score (Arnett DK, et al., 2019) failed to calculate for the following reasons:   The patient has a prior MI or stroke diagnosis   Financial Resource Strain: Low Risk  (11/11/2021)   Overall Financial Resource Strain (CARDIA)    Difficulty of Paying Living Expenses: Not hard at all       04/05/2022   11:51 AM 11/11/2021   10:50 AM 06/03/2021    8:05 AM  Depression screen PHQ 2/9  Decreased Interest 2 0 1  Down, Depressed, Hopeless 2 0 2  PHQ - 2 Score 4 0 3  Altered sleeping 3  2  Tired, decreased energy 3  2  Change in appetite 0  0  Feeling bad or failure about yourself  0  2  Trouble concentrating 0  2  Moving slowly or fidgety/restless 0  2  Suicidal thoughts 0  0  PHQ-9 Score 10  13  Difficult doing work/chores Somewhat difficult  Very difficult     Social History   Tobacco Use  Smoking Status Former   Packs/day: 3.00   Years: 45.00   Total pack years: 135.00   Types: Cigarettes   Quit date: 08/08/1997   Years since quitting: 24.7   Passive exposure: Past  Smokeless Tobacco Former   BP Readings from Last 3 Encounters:  04/05/22 132/68  03/25/22 (!) 157/70  02/24/22 130/68   Pulse Readings from  Last 3 Encounters:  04/05/22 78  03/25/22 80  02/24/22 82   Wt Readings from Last 3 Encounters:  04/05/22 178 lb (80.7 kg)  03/25/22 177 lb 3.2 oz (80.4 kg)  02/24/22 173 lb (78.5 kg)   BMI  Readings from Last 3 Encounters:  04/05/22 26.29 kg/m  03/25/22 26.17 kg/m  02/24/22 25.55 kg/m    Assessment/Interventions: Review of patient past medical history, allergies, medications, health status, including review of consultants reports, laboratory and other test data, was performed as part of comprehensive evaluation and provision of chronic care management services.   SDOH:  (Social Determinants of Health) assessments and interventions performed: No, done within year Financial Resource Strain: Low Risk  (11/11/2021)   Overall Financial Resource Strain (CARDIA)    Difficulty of Paying Living Expenses: Not hard at all    SDOH Interventions    [place] Office Visit from 04/05/2022 in [place] from 11/11/2021 in [place] from 05/07/2021 in [place] Interventions     Food Insecurity Interventions -- Intervention Not Indicated Intervention Not Indicated  Housing Interventions -- Intervention Not Indicated Intervention Not Indicated  Transportation Interventions -- Intervention Not Indicated Intervention Not Indicated  Depression Interventions/Treatment  Medication -- --  Financial Strain Interventions -- Intervention Not Indicated Other (Comment)  [patient to bring up to Dr. Antony Contras attention about change in cost of medication]  Physical Activity Interventions -- Other (Comments)  [Increase as tolerated.] Intervention Not Indicated  Stress Interventions -- Intervention Not Indicated Intervention Not Indicated  Social Connections Interventions -- Intervention Not Indicated Intervention Not Indicated      Financial Resource Strain: Low Risk  (11/11/2021)   Overall Financial Resource Strain  (CARDIA)    Difficulty of Paying Living Expenses: Not hard at all    [place]: No Food Insecurity (11/11/2021)  Housing: Low Risk  (11/11/2021)  Transportation Needs: No Transportation Needs (11/11/2021)  Alcohol Screen: Low Risk  (11/11/2021)  Depression (PHQ2-9): Medium Risk (04/05/2022)  Financial Resource Strain: Low Risk  (11/11/2021)  Physical Activity: Insufficiently Active (11/11/2021)  Social Connections: Socially Integrated (11/11/2021)  Stress: No Stress Concern Present (11/11/2021)  Tobacco Use: Medium Risk (03/25/2022)    [place]  Allergies  Allergen Reactions   Breztri Aerosphere [Budeson-Glycopyrrol-Formoterol] Swelling    Swelling of mouth tongue    Norvasc [Amlodipine Besylate] Rash    rash   Sulfa Antibiotics Hives    Hives     Medications Reviewed Today     Reviewed by [name], [facility] (Pharmacist) on 04/21/22 at 1526  Med List Status: <None>   Medication Order Taking? Sig Documenting Provider Last Dose Status Informant  albuterol (VENTOLIN HFA) 108 (90 Base) MCG/ACT inhaler [id number] Yes INHALE 2 PUFFS BY MOUTH EVERY 6 HOURS AS NEEDED FOR WHEEZING FOR SHORTNESS OF Montel Clock, MD Taking Active Self  ALPRAZolam Duanne Moron) 0.5 MG tablet [id number] Yes Take 1 tablet (0.5 mg total) by mouth 3 (three) times daily as needed for anxiety or sleep. [name], MD Taking Active   apixaban (ELIQUIS) 5 MG TABS tablet [id number] Yes Take 1 tablet (5 mg total) by mouth 2 (two) times daily. Early Osmond, MD Taking Active Self  atorvastatin (LIPITOR) 80 MG tablet [id number] Yes TAKE 1 TABLET BY MOUTH ONCE DAILY AT  6  PM [name], MD Taking Active Self  carvedilol (COREG) 25 MG tablet [id number] Yes Take 1 tablet (25 mg total) by mouth 2 (two) times daily. Early Osmond, MD Taking Active Self  clopidogrel (PLAVIX) 75 MG tablet [id number] Yes Take 1 tablet by mouth once daily [name], MD Taking Active Self   diphenoxylate-atropine (LOMOTIL)  2.5-0.025 MG tablet [id number] Yes Take 1 tablet by mouth 4 (four) times daily as needed for diarrhea or loose stools. [name], MD Taking Active Self  empagliflozin (JARDIANCE) 10 MG TABS tablet [id number] Yes Take 1 tablet (10 mg total) by mouth daily before breakfast. Early Osmond, MD Taking Active Self  escitalopram (LEXAPRO) 10 MG tablet [id number] Yes Take 1 tablet (10 mg total) by mouth daily. [name], MD Taking Active   ezetimibe (ZETIA) 10 MG tablet [id number] Yes Take 1 tablet (10 mg total) by mouth daily. Early Osmond, MD Taking Active   furosemide (LASIX) 20 MG tablet [id number] Yes Take 1 tablet (20 mg total) by mouth as needed for fluid or edema.  Patient taking differently: Take 40 mg by mouth daily.   Baldwin Jamaica, PA-C Taking Active Self  losartan (COZAAR) 25 MG tablet [id number] Yes Take 1 tablet (25 mg total) by mouth at bedtime. Early Osmond, MD Taking Active Self  meclizine (ANTIVERT) 25 MG tablet [id number] Yes Take 1 tablet (25 mg total) by mouth 3 (three) times daily as needed for dizziness. [name], MD Taking Active Self  Naphazoline HCl (CLEAR EYES OP) [id number] Yes Place 1 drop into both eyes daily as needed (Runny eyes). [provider] Taking Active Self  nilotinib (TASIGNA) 50 MG capsule [id number] Yes Take 2 capsules (200 mg total) by mouth every 12 hours [name], MD Taking Active   sildenafil (VIAGRA) 100 MG tablet [id number] Yes Take 0.5-1 tablets (50-100 mg total) by mouth daily as needed for erectile dysfunction. [name], MD Taking Active Self  Tiotropium Bromide-Olodaterol (STIOLTO RESPIMAT) 2.5-2.5 MCG/ACT AERS [id number] Yes Inhale 2 puffs into the lungs every morning. [provider] Taking Active Self  Xylometazoline HCl (4-WAY NASAL SPRAY NA) [id number] Yes Place 1 spray into both nostrils daily as needed (Congestion). 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   Chronic anemia 09/21/2018   Anemia due to antineoplastic chemotherapy 11/17/2015   Protein-calorie malnutrition, mild ([place]) 11/17/2015   Congestive heart failure with cardiomyopathy ([place]) 06/25/2015   Chest skin lesion 04/06/2015   Pleural effusion on left 03/25/2015   Diarrhea due to drug 03/17/2015   Bradycardia 10/23/2014   Orthostatic hypotension 06/23/2014   Syncope 06/23/2014   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   CAD (coronary artery disease) of artery bypass graft, occluded VG-RCA 05/23/2014   NSTEMI (non-ST elevated myocardial infarction) ([place]) 05/21/2014   Cardiomyopathy, ischemic 05/21/2014   Ventricular tachycardia ([place]) 05/16/2014   Muscle spasm 05/28/2013   PVCs (premature ventricular contractions) 05/23/2013   Carotid disease, bilateral ([place])    Hyperlipidemia    HTN (hypertension)    Allergic rhinitis    Prostate hypertrophy    Anxiety    CAD (coronary artery disease), hx of CABG    TIA (transient ischemic attack)    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    Immunization History  Administered Date(s) Administered   Fluad Quad(high Dose 65+) 05/14/2019   Influenza, High Dose Seasonal PF 06/22/2018   Influenza,inj,Quad PF,6+ Mos 05/21/2013, 05/07/2014, 04/28/2015, 04/28/2016, 05/09/2017  Pneumococcal Conjugate-13 01/29/2015   Pneumococcal Polysaccharide-23 05/07/2014    Conditions to be addressed/monitored:  CAD, HTN, HX of TIA, CHF, COPD, Allergic Rhinitis, HLD, Anxiety  Care Plan : General Pharmacy (Adult)  Updates made by [name], RPH  since 04/21/2022 12:00 AM     Problem: CAD, HTN, HX of TIA, CHF, COPD, Allergic Rhinitis, HLD, Anxiety   Priority: High  Onset Date: 07/15/2021     Long-Range Goal: Patient-Specific Goal   Start Date: 07/15/2021  Expected End Date: 01/13/2022  Recent Progress: On track  Priority: High  Note:   Current Barriers:  Unable to independently afford treatment regimen Unable to achieve control of BP  Suboptimal therapeutic regimen for COPD  Pharmacist Clinical Goal(s):  Patient will verbalize ability to afford treatment regimen achieve control of BP as evidenced by home monitoring adhere to plan to optimize therapeutic regimen for COPD as evidenced by report of adherence to recommended medication management changes through collaboration with PharmD and provider.   Interventions: 1:1 collaboration with [name], MD regarding development and update of comprehensive plan of care as evidenced by provider attestation and co-signature Inter-disciplinary care team collaboration (see longitudinal plan of care) Comprehensive medication review performed; medication list updated in electronic medical record  Hypertension (BP goal <130/80) -Uncontrolled -Current treatment: Carvedilol 55m BID -Medications previously tried: lisinopril, losartan, Toprol XL, propranolol, valsartan, verapamil  -Current home readings: 148/71 today, no other home readings provided - does have access to cuff whenever he needs -Current dietary habits: HIGH salt intake, eats [place] for breakfast every morning, other meals are home cooked by his wife.  She cooks a lot of vegetables from garden and will add lean meats in sometimes.  Not much hamburger.  Does add salt to foods at home. -Current exercise habits: loves to walk around [employer], hunts regularly, active in garden during the summer months -Denies hypotensive/hypertensive symptoms - did have some dizziness a few weeks ago which has resolved. -Educated on BP goals  and benefits of medications for prevention of heart attack, stroke and kidney damage; Daily salt intake goal < 2300 mg; Importance of home blood pressure monitoring; Symptoms of hypotension and importance of maintaining adequate hydration; -Counseled to monitor BP at home daily, document, and provide log at future appointments -Counseled on diet and exercise extensively Recommended to continue current medication Recommended he monitor BP at home twice daily for 2 weeks.  Will have CMA call for BP check at that point and report to me. Adjust meds possibly at that time.  Hyperlipidemia: (LDL goal < 70) 04/21/22 -Controlled -Current treatment: Atorvastatin 855mdaily Appropriate, Effective, Safe, Accessible -Medications previously tried: none noted  -Current dietary patterns: see HTN -Current exercise habits: see HTN -Educated on Cholesterol goals;  Benefits of statin for ASCVD risk reduction; -Patient had held his Zetia and his statin muscle aches have improved.  I feel that the addition of zetia caused the muscle aches.  I would prefer him to be on a statin rather than just zetia.  Will consult with PCP on resuming statin and remaining off zetia.  Update 10/14/21 High risk - high intensity statin is appropriate LDL at goal < 70. Recommend recheck lipids at 1 year from previous No changes at this time reinforced adherence.  Heart Failure (Goal: manage symptoms and prevent exacerbations) 04/21/22 -Controlled -Last ejection fraction: 40-45% -NYHA Class: II (slight limitation of activity) -AHA HF Stage: C (Heart disease and symptoms present) -Current treatment: Jardiance 1024maily Appropriate, Effective, Safe, Accessible  Furosemide 47m every other day - he is only taking PRN Appropriate, Effective, Safe, Accessible Carvedilol 240mBID Appropriate, Effective, Safe, Accessible Losartan 2569mppropriate, Effective, Safe, Accessible -Medications previously tried: losartan  -Current  home BP/HR readings: no logs discussed today -Current dietary habits: see HTN - high salt content -Current exercise habits: see HTN -Educated on Importance of weighing daily; if you gain more than 3 pounds in one day or 5 pounds in one week, contact providers Importance of blood pressure control He does report symptoms of SOB, denies any visible swelling.  Take his lasix prn based on how much he is urinating. -Recommended to continue current medication -Denies SOB, swelling.  No longer on entresto per cardiology.  He was confused on what is losartan was for.  Instructed him that he needs to continue on losartan as this was for his heart.  Patient understands, medications clarified.  Update 10/14/21 He has a visit with cardiologist this morning.  They scheduled an echocardiogram.  The plan was if EF was > 35% to stop Entresto and switch to Losartan.  They went ahead and started patient on Jardiance 53m46mily.  He will begin taking tomorrow. Discussed the cost barriers that may come up with this, will leave application for BI cares at front desk he can pick up when he comes for PCP appointment. He denies any swelling or SOB - besides when he over exerts himself. No changes at this time - will FU after Echo which is scheduled for the 22nd.   COPD (Goal: control symptoms and prevent exacerbations) -Uncontrolled -Current treatment  Albuterol HFA 90mc62mn -Medications previously tried: Breztri - swelling of mouth and tongue  -Current COPD Classification:  B (high sx, <2 exacerbations/yr) -Pulmonary function testing:Pulmonary Functions Testing Results: No results found for: FEV1, FVC, FEV1FVC, TLC, DLCO -Exacerbations requiring treatment in last 6 months: none -Patient denies consistent use of maintenance inhaler -Frequency of rescue inhaler use: 3-4 times per day -Counseled on Benefits of consistent maintenance inhaler use When to use rescue inhaler Differences between maintenance and rescue  inhalers Possibility that mouth swelling was ICS induced thrush. -Recommended to continue current medication Based on symptom level I suspect patient would benefit from maintenance therapy for his COPD. As cost is an issue we will look at options with easier PAP programs.   Recommend Stiolto Respimat due to ease of delivery and application process.  Will discuss with PCP.   Patient Goals/Self-Care Activities Patient will:  - take medications as prescribed as evidenced by patient report and record review focus on medication adherence by pill box check blood pressure daily, document, and provide at future appointments weigh daily, and contact provider if weight gain of 3 lbs in one day or 5 lbs in one week  Follow Up Plan: The care management team will reach out to the patient again over the next 90 days.              Medication Assistance: Application for Stiolto/Entresto  medication assistance program. in process.  Anticipated assistance start date unknown.  See plan of care for additional detail.  Compliance/Adherence/Medication fill history: Care Gaps: None  Star-Rating Drugs: Atorvastatin 80mg 60my 01/04/22 90ds Losartan 25mg 014m/23 90ds  [facility]  [facility] [address] [id number] [phone] [address]  [address] [place] [address] Phone: [phone] Fax: [phone]  [facility], [place] [address] [place] [address] Phone: [phone] Fax: [phone]   Uses pill box? Yes Pt endorses 100% compliance  We discussed: Benefits of medication synchronization, packaging and delivery as well as enhanced pharmacist oversight with [employer]. Patient decided to: Continue current  medication management strategy  Care Plan and Follow Up Patient Decision:  Patient agrees to Care Plan and Follow-up.  Plan: The care management team will reach out to the patient again over the next 90 days.  Beverly Milch, PharmD, CPP Clinical Pharmacist Practitioner [place] [phone]

## 2022-04-21 ENCOUNTER — Ambulatory Visit: Payer: Medicare HMO

## 2022-04-21 DIAGNOSIS — E785 Hyperlipidemia, unspecified: Secondary | ICD-10-CM

## 2022-04-21 DIAGNOSIS — I5020 Unspecified systolic (congestive) heart failure: Secondary | ICD-10-CM

## 2022-04-21 NOTE — Patient Instructions (Addendum)
Visit Information   Goals Addressed             This Visit's Progress    Track and Manage My Blood Pressure-Hypertension   On track    Timeframe:  Long-Range Goal Priority:  High Start Date:   07/15/21                          Expected End Date:  01/13/22                     Follow Up Date 10/13/21    - check blood pressure daily - choose a place to take my blood pressure (home, clinic or office, retail store) - write blood pressure results in a log or diary    Why is this important?   You won't feel high blood pressure, but it can still hurt your blood vessels.  High blood pressure can cause heart or kidney problems. It can also cause a stroke.  Making lifestyle changes like losing a little weight or eating less salt will help.  Checking your blood pressure at home and at different times of the day can help to control blood pressure.  If the doctor prescribes medicine remember to take it the way the doctor ordered.  Call the office if you cannot afford the medicine or if there are questions about it.     Notes: CMA to call in 2 weeks       Patient Care Plan: General Pharmacy (Adult)     Problem Identified: CAD, HTN, HX of TIA, CHF, COPD, Allergic Rhinitis, HLD, Anxiety   Priority: High  Onset Date: 07/15/2021     Long-Range Goal: Patient-Specific Goal   Start Date: 07/15/2021  Expected End Date: 01/13/2022  Recent Progress: On track  Priority: High  Note:   Current Barriers:  Unable to independently afford treatment regimen Unable to achieve control of BP  Suboptimal therapeutic regimen for COPD  Pharmacist Clinical Goal(s):  Patient will verbalize ability to afford treatment regimen achieve control of BP as evidenced by home monitoring adhere to plan to optimize therapeutic regimen for COPD as evidenced by report of adherence to recommended medication management changes through collaboration with PharmD and provider.   Interventions: 1:1 collaboration with  [name], MD regarding development and update of comprehensive plan of care as evidenced by provider attestation and co-signature Inter-disciplinary care team collaboration (see longitudinal plan of care) Comprehensive medication review performed; medication list updated in electronic medical record  Hypertension (BP goal <130/80) -Uncontrolled -Current treatment: Carvedilol '25mg'$  BID -Medications previously tried: lisinopril, losartan, Toprol XL, propranolol, valsartan, verapamil  -Current home readings: 148/71 today, no other home readings provided - does have access to cuff whenever he needs -Current dietary habits: HIGH salt intake, eats [employer] for breakfast every morning, other meals are home cooked by his wife.  She cooks a lot of vegetables from garden and will add lean meats in sometimes.  Not much hamburger.  Does add salt to foods at home. -Current exercise habits: loves to walk around [employer], hunts regularly, active in garden during the summer months -Denies hypotensive/hypertensive symptoms - did have some dizziness a few weeks ago which has resolved. -Educated on BP goals and benefits of medications for prevention of heart attack, stroke and kidney damage; Daily salt intake goal < 2300 mg; Importance of home blood pressure monitoring; Symptoms of hypotension and importance of maintaining adequate hydration; -Counseled to monitor BP  at home daily, document, and provide log at future appointments -Counseled on diet and exercise extensively Recommended to continue current medication Recommended he monitor BP at home twice daily for 2 weeks.  Will have CMA call for BP check at that point and report to me. Adjust meds possibly at that time.  Hyperlipidemia: (LDL goal < 70) 04/21/22 -Controlled -Current treatment: Atorvastatin '80mg'$  daily Appropriate, Effective, Safe, Accessible -Medications previously tried: none noted  -Current dietary patterns: see HTN -Current  exercise habits: see HTN -Educated on Cholesterol goals;  Benefits of statin for ASCVD risk reduction; -Patient had held his Zetia and his statin muscle aches have improved.  I feel that the addition of zetia caused the muscle aches.  I would prefer him to be on a statin rather than just zetia.  Will consult with PCP on resuming statin and remaining off zetia.  Update 10/14/21 High risk - high intensity statin is appropriate LDL at goal < 70. Recommend recheck lipids at 1 year from previous No changes at this time reinforced adherence.  Heart Failure (Goal: manage symptoms and prevent exacerbations) 04/21/22 -Controlled -Last ejection fraction: 40-45% -NYHA Class: II (slight limitation of activity) -AHA HF Stage: C (Heart disease and symptoms present) -Current treatment: Jardiance '10mg'$  daily Appropriate, Effective, Safe, Accessible Furosemide '20mg'$  every other day - he is only taking PRN Appropriate, Effective, Safe, Accessible Carvedilol '25mg'$  BID Appropriate, Effective, Safe, Accessible Losartan '25mg'$  Appropriate, Effective, Safe, Accessible -Medications previously tried: losartan  -Current home BP/HR readings: no logs discussed today -Current dietary habits: see HTN - high salt content -Current exercise habits: see HTN -Educated on Importance of weighing daily; if you gain more than 3 pounds in one day or 5 pounds in one week, contact providers Importance of blood pressure control He does report symptoms of SOB, denies any visible swelling.  Take his lasix prn based on how much he is urinating. -Recommended to continue current medication -Denies SOB, swelling.  No longer on entresto per cardiology.  He was confused on what is losartan was for.  Instructed him that he needs to continue on losartan as this was for his heart.  Patient understands, medications clarified.  Update 10/14/21 He has a visit with cardiologist this morning.  They scheduled an echocardiogram.  The plan was if EF  was > 35% to stop Entresto and switch to Losartan.  They went ahead and started patient on Jardiance '10mg'$  daily.  He will begin taking tomorrow. Discussed the cost barriers that may come up with this, will leave application for BI cares at front desk he can pick up when he comes for PCP appointment. He denies any swelling or SOB - besides when he over exerts himself. No changes at this time - will FU after Echo which is scheduled for the 22nd.   COPD (Goal: control symptoms and prevent exacerbations) -Uncontrolled -Current treatment  Albuterol HFA 40mg prn -Medications previously tried: Breztri - swelling of mouth and tongue  -Current COPD Classification:  B (high sx, <2 exacerbations/yr) -Pulmonary function testing:Pulmonary Functions Testing Results: No results found for: FEV1, FVC, FEV1FVC, TLC, DLCO -Exacerbations requiring treatment in last 6 months: none -Patient denies consistent use of maintenance inhaler -Frequency of rescue inhaler use: 3-4 times per day -Counseled on Benefits of consistent maintenance inhaler use When to use rescue inhaler Differences between maintenance and rescue inhalers Possibility that mouth swelling was ICS induced thrush. -Recommended to continue current medication Based on symptom level I suspect patient would benefit from maintenance therapy  for his COPD. As cost is an issue we will look at options with easier PAP programs.   Recommend Stiolto Respimat due to ease of delivery and application process.  Will discuss with PCP.   Patient Goals/Self-Care Activities Patient will:  - take medications as prescribed as evidenced by patient report and record review focus on medication adherence by pill box check blood pressure daily, document, and provide at future appointments weigh daily, and contact provider if weight gain of 3 lbs in one day or 5 lbs in one week  Follow Up Plan: The care management team will reach out to the patient again over the next  90 days.              The patient verbalized understanding of instructions, educational materials, and care plan provided today and DECLINED offer to receive copy of patient instructions, educational materials, and care plan.  Telephone follow up appointment with pharmacy team member scheduled for: 3 months  [name], [place], PharmD, [place] Clinical Pharmacist Practitioner [place] [phone]

## 2022-04-29 ENCOUNTER — Ambulatory Visit: Payer: Self-pay | Admitting: *Deleted

## 2022-04-29 NOTE — Telephone Encounter (Signed)
  Chief Complaint: light headed when first stands Symptoms: vision gets dark Frequency: when first stands and sometimes other times Pertinent Negatives: Patient denies fainting Disposition: '[]'$ ED /'[x]'$ Urgent Care (no appt availability in office) / '[x]'$ Appointment(In office/virtual)/ '[]'$  [facility]/ '[]'$ Home Care/ '[]'$ Refused Recommended Disposition /'[]'$ [facility]/ '[]'$  Follow-up with PCP Additional Notes: Pt has appt Monday, he has been advised to drink 8 glasses of fluids (non caffeine) today and if symptoms do not improve to go to UC. Will ask MD if should hold Cozaar or Coreg or both. BP sitting 139/70 HR 66 irreg (has hx afib) standing 129/63 HR 83. Pt states did not get light headed this time.  Reason for Disposition  [1] MILD dizziness (e.g., walking normally) AND [2] has NOT been evaluated by doctor (or NP/PA) for this  (Exception: Dizziness caused by heat exposure, sudden standing, or poor fluid intake.)  Answer Assessment - Initial Assessment Questions 1. DESCRIPTION: "Describe your dizziness."     When first stands 2. LIGHTHEADED: "Do you feel lightheaded?" (e.g., somewhat faint, woozy, weak upon standing)     Lightheaded more than dizzy 3. VERTIGO: "Do you feel like either you or the room is spinning or tilting?" (i.e. vertigo)     no 4. SEVERITY: "How bad is it?"  "Do you feel like you are going to faint?" "Can you stand and walk?"   - MILD: Feels slightly dizzy, but walking normally.   - MODERATE: Feels unsteady when walking, but not falling; interferes with normal activities (e.g., school, work).   - SEVERE: Unable to walk without falling, or requires assistance to walk without falling; feels like passing out now.      Sometimes vision goes black 5. ONSET:  "When did the dizziness begin?"     2 weeks ago 6. AGGRAVATING FACTORS: "Does anything make it worse?" (e.g., standing, change in head position)     standing 7. HEART RATE: "Can you tell me your heart rate?"  "How many beats in 15 seconds?"  (Note: not all patients can do this)       Has afib, 60-80 8. CAUSE: "What do you think is causing the dizziness?"     unsure 9. RECURRENT SYMPTOM: "Have you had dizziness before?" If Yes, ask: "When was the last time?" "What happened that time?"     Just for two weeks 10. OTHER SYMPTOMS: "Do you have any other symptoms?" (e.g., fever, chest pain, vomiting, diarrhea, bleeding)       no 11. PREGNANCY: "Is there any chance you are pregnant?" "When was your last menstrual period?"       no  Protocols used: Dizziness - Lightheadedness-A-AH

## 2022-04-29 NOTE — Telephone Encounter (Signed)
Called and spoke w/pt regarding earlier msg (light headed when first stands), per Dr. Dennard Schaumann, for pt to Hold cozaar to see if it improves.    Advice pt to keep [place] posted and go to UC/ED if needed. Pt voiced understanding and nothing further.

## 2022-05-02 ENCOUNTER — Ambulatory Visit (INDEPENDENT_AMBULATORY_CARE_PROVIDER_SITE_OTHER): Payer: Medicare HMO | Admitting: Family Medicine

## 2022-05-02 VITALS — BP 124/72 | HR 70 | Temp 98.6°F | Wt 179.0 lb

## 2022-05-02 DIAGNOSIS — I4891 Unspecified atrial fibrillation: Secondary | ICD-10-CM | POA: Diagnosis not present

## 2022-05-02 DIAGNOSIS — R42 Dizziness and giddiness: Secondary | ICD-10-CM | POA: Diagnosis not present

## 2022-05-02 DIAGNOSIS — I5020 Unspecified systolic (congestive) heart failure: Secondary | ICD-10-CM

## 2022-05-02 DIAGNOSIS — R55 Syncope and collapse: Secondary | ICD-10-CM

## 2022-05-02 NOTE — Progress Notes (Signed)
Subjective:    Patient ID: Anthony Wolfe, male    DOB: [birth year], 76 y.o.   MRN: [id number]  Patient presents today reporting dizziness.  He states that when he is walking outside, he feels lightheaded like he is going to pass out.  He states that if he stands up quickly he feels like he is going to blackout.  He states that his vision started to go dark.  However he is yet to experience syncope.  He also reports an irregular heartbeat at times.  Patient has a defibrillator present.  He denies any shocks.  He also has a history of atrial fibrillation for which she takes Eliquis.  Patient underwent urgent in May 2023.  However today he has an irregular heart rate on exam..  Last week I had the patient hold his losartan due to his orthostatic dizziness.  He states that since he held the losartan, the dizziness has improved and he has not experienced any further dizziness.  However he also has a history of congestive heart failure for which she was taking the losartan.  Patient had lab work just 1 month ago that showed his chronic stable anemia.  His hemoglobin typically runs around 10.   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 difficile colitis 06/16/2014   CAD (coronary artery disease)    a. s/p 3v CABG 2010. b. NSTEMI 05/2014 in setting of VT. Cath impressions: "Recent IMI with occluded SVG to RCA. Has Right to Right collaterals and collaterals from septal and OM. EF 40%."   Carotid disease, bilateral ([place])    a. Right CEA 2002; known occluded left carotid. b. Duplex 06/2014: known occluded LICA, stable 9-32% RICA s/p CEA.   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 Prior EF 36%. b. 2014: 50%. c. 05/2014: EF 35-40% by echo, 40% with inf HK by cath.   NSTEMI (non-ST elevation myocardial infarction) ([place]) 05/2014   Pleural effusion 05/2014   Pneumonia 05/23/2014   Presence of permanent cardiac pacemaker    Prostate hypertrophy    PVC's (premature ventricular contractions)    a. PVC's with prior Holter showing PVC load of 22%.   Shortness of breath dyspnea    Stroke ([place]) 01   TIA (transient ischemic attack)    ASPVD, S./P. right CEA, 2002, and known occluded left carotid   Ventricular tachycardia ([place])    a. Admitted with VT 05/2013 - EPS with inducible sustained monomorphic VT; s/p Medtr[employer] ICD 05/21/14.   Past Surgical History:  Procedure Laterality Date   BACK SURGERY     CARDIOVERSION N/A 01/04/2022   Procedure: CARDIOVERSION;  Surgeon: Elouise Munroe, MD;  Location: [facility];  Service: Cardiovascular;  Laterality: N/A;   CAROTID ENDARTERECTOMY Right 01   carotidectomy  2003   right side   COLONOSCOPY  07/2010   CORONARY ARTERY BYPASS GRAFT  02/2009   3 vessel   ELECTROPHYSIOLOGIC STUDY  05/21/14   ELECTROPHYSIOLOGY STUDY N/A 05/21/2014   Procedure: ELECTROPHYSIOLOGY STUDY;  Surgeon: Deboraha Sprang, MD;  [facility] C[facility] LAB;  Service: Cardiovascular;  Laterality: N/A;   EP IMPLANTABLE DEVICE  05/21/14   single chamber Metronic ICD   IR THORACENTESIS ASP PLEURAL SPACE W/IMG GUIDE  12/07/2021   LEFT HEART CATHETERIZATION WITH CORONARY/GRAFT ANGIOGRAM N/A 05/19/2014   Procedure: LEFT HEART CATHETERIZATION WITH Beatrix Fetters;  Surgeon: Josue Hector, MD;  Location[facility] C[facility];  Service: Cardiovascular;  Laterality: N/A;   PLEURAL BIOPSY N/A 11/27/2014   Procedure: PLEURAL BIOPSY;  Surgeon: [name], MD;  Location: [facility];  Service: Thoracic;  Laterality: N/A;   PLEURAL EFFUSION DRAINAGE Right 11/27/2014   Procedure: DRAINAGE OF PLEURAL EFFUSION;  Surgeon: [name], MD;  Location:  [facility];  Service: Thoracic;  Laterality: Right;   TALC PLEURODESIS Right 11/27/2014   Procedure: Pietro Cassis;  Surgeon: [name], MD;  Location: [facility];  Service: Thoracic;  Laterality: Right;   VIDEO ASSISTED THORACOSCOPY Right 11/27/2014   Procedure: RIGHT VIDEO ASSISTED THORACOSCOPY;  Surgeon: [name], MD;  Location: [facility];  Service: Thoracic;  Laterality: Right;   Current Outpatient Medications on File Prior to Visit  Medication Sig Dispense Refill   albuterol (VENTOLIN HFA) 108 (90 Base) MCG/ACT inhaler INHALE 2 PUFFS BY MOUTH EVERY 6 HOURS AS NEEDED FOR WHEEZING FOR SHORTNESS OF BREATH 18 g 11   ALPRAZolam (XANAX) 0.5 MG tablet Take 1 tablet (0.5 mg total) by mouth 3 (three) times daily as needed for anxiety or sleep. 30 tablet 0   apixaban (ELIQUIS) 5 MG TABS tablet Take 1 tablet (5 mg total) by mouth 2 (two) times daily. 60 tablet 6   atorvastatin (LIPITOR) 80 MG tablet TAKE 1 TABLET BY MOUTH ONCE DAILY AT  6  PM 90 tablet 3   carvedilol (COREG) 25 MG tablet Take 1 tablet (25 mg total) by mouth 2 (two) times daily. 180 tablet 3   clopidogrel (PLAVIX) 75 MG tablet Take 1 tablet by mouth once daily 90 tablet 3   diphenoxylate-atropine (LOMOTIL) 2.5-0.025 MG tablet Take 1 tablet by mouth 4 (four) times daily as needed for diarrhea or loose stools. 30 tablet 0   empagliflozin (JARDIANCE) 10 MG TABS tablet Take 1 tablet (10 mg total) by mouth daily before breakfast. 30 tablet 11   escitalopram (LEXAPRO) 10 MG tablet Take 1 tablet (10 mg total) by mouth daily. 30 tablet 5   ezetimibe (ZETIA) 10 MG tablet Take 1 tablet (10 mg total) by mouth daily. 90 tablet 3   furosemide (LASIX) 20 MG tablet Take 1 tablet (20 mg total) by mouth as needed for fluid or edema. (Patient taking differently: Take 40 mg by mouth daily.) 90 tablet 1   meclizine (ANTIVERT) 25 MG tablet Take 1 tablet (25 mg total) by mouth 3 (three) times daily as needed for dizziness. 30 tablet 0   Naphazoline  HCl (CLEAR EYES OP) Place 1 drop into both eyes daily as needed (Runny eyes).     nilotinib (TASIGNA) 50 MG capsule Take 2 capsules (200 mg total) by mouth every 12 hours 120 capsule 3   sildenafil (VIAGRA) 100 MG tablet Take 0.5-1 tablets (50-100 mg total) by mouth daily as needed for erectile dysfunction. 5 tablet 11   Tiotropium Bromide-Olodaterol (STIOLTO RESPIMAT) 2.5-2.5 MCG/ACT AERS Inhale 2 puffs into the lungs every morning.     Xylometazoline HCl (4-WAY NASAL SPRAY NA) Place 1 spray into both nostrils daily as needed (Congestion).     losartan (COZAAR) 25 MG tablet Take 1 tablet (25 mg total) by mouth at bedtime. (  Patient not taking: Reported on 05/02/2022) 90 tablet 3   No current facility-administered medications on file prior to visit.   Allergies  Allergen Reactions   Breztri Aerosphere [Budeson-Glycopyrrol-Formoterol] Swelling    Swelling of mouth tongue    Norvasc [Amlodipine Besylate] Rash    rash   Sulfa Antibiotics Hives    Hives    Social History   Socioeconomic History   Marital status: Married    Spouse name: Not on file   Number of children: 2   Years of education: Not on file   Highest education level: Not on file  Occupational History    Comment: retired Development worker, international aid  Tobacco Use   Smoking status: Former    Packs/day: 3.00    Years: 45.00    Total pack years: 135.00    Types: Cigarettes    Quit date: 08/08/1997    Years since quitting: 24.7    Passive exposure: Past   Smokeless tobacco: Former  Scientific laboratory technician Use: Never used  Substance and Sexual Activity   Alcohol use: No    Alcohol/week: 0.0 standard drinks of alcohol   Drug use: No   Sexual activity: Yes  Other Topics Concern   Not on file  Social History Narrative   Not on file   Social Determinants of Health   Financial Resource Strain: Low Risk  (11/11/2021)   Overall Financial Resource Strain (CARDIA)    Difficulty of Paying Living Expenses: Not hard at all  Food Insecurity: No  Food Insecurity (11/11/2021)   Hunger Vital Sign    Worried About Running Out of Food in the Last Year: Never true    [place] in the Last Year: Never true  Transportation Needs: No Transportation Needs (11/11/2021)   PRAPARE - Hydrologist (Medical): No    Lack of Transportation (Non-Medical): No  Physical Activity: Insufficiently Active (11/11/2021)   Exercise Vital Sign    Days of Exercise per Week: 3 days    Minutes of Exercise per Session: 30 min  Stress: No Stress Concern Present (11/11/2021)   [place]    Feeling of Stress : Not at all  Social Connections: [place] (11/11/2021)   Social Connection and Isolation Panel [NHANES]    Frequency of Communication with Friends and Family: More than three times a week    Frequency of Social Gatherings with Friends and Family: More than three times a week    Attends Religious Services: More than 4 times per year    Active Member of [employer] or Organizations: Yes    Attends Archivist Meetings: More than 4 times per year    Marital Status: Married  Human resources officer Violence: Not At Risk (11/11/2021)   Humiliation, Afraid, Rape, and Kick questionnaire    Fear of Current or Ex-Partner: No    Emotionally Abused: No    Physically Abused: No    Sexually Abused: No      Review of Systems  Gastrointestinal:  Positive for diarrhea.  All other systems reviewed and are negative.      Objective:   Physical Exam Vitals reviewed.  Constitutional:      General: He is not in acute distress.    Appearance: Normal appearance. He is well-developed. He is not ill-appearing, toxic-appearing or diaphoretic.  Cardiovascular:     Rate and Rhythm: Normal rate. Rhythm irregularly irregular.     Heart sounds:  Normal heart sounds. No murmur heard. Pulmonary:     Effort: Pulmonary effort is normal. No tachypnea, accessory muscle usage,  prolonged expiration or respiratory distress.     Breath sounds: Rales present. No wheezing.  Abdominal:     General: Bowel sounds are normal. There is no distension.     Palpations: Abdomen is soft. There is no mass.     Tenderness: There is no abdominal tenderness. There is no guarding or rebound.  Musculoskeletal:     Right lower leg: No edema.     Left lower leg: No edema.  Neurological:     Mental Status: He is alert.           Assessment & Plan:  Atrial fibrillation, unspecified type ([place]) - Plan: EKG 12-Lead  Heart failure with reduced ejection fraction (HCC)  Postural dizziness with presyncope EKG today shows normal sinus rhythm with a right bundle branch block but thankfully no evidence of atrial fibrillation.  I believe the irregular heart rate I was auscultating may have been PVCs.  Patient does report feeling occasional skipped heartbeat.  However at the present time I do not feel that that is the reason he is dizzy.  I believe he is dizzy due to orthostatic hypotension.  However I explained to him that he is on the losartan due to his congestive heart failure.  We will try taking one half of a 25 mg losartan pill daily and see if he can tolerate this dose.  If not we will discontinue the medication altogether

## 2022-05-16 ENCOUNTER — Inpatient Hospital Stay: Payer: Medicare HMO | Attending: Hematology and Oncology

## 2022-05-16 ENCOUNTER — Other Ambulatory Visit: Payer: Self-pay

## 2022-05-16 DIAGNOSIS — D6481 Anemia due to antineoplastic chemotherapy: Secondary | ICD-10-CM | POA: Diagnosis not present

## 2022-05-16 DIAGNOSIS — C9210 Chronic myeloid leukemia, BCR/ABL-positive, not having achieved remission: Secondary | ICD-10-CM | POA: Insufficient documentation

## 2022-05-16 LAB — CBC WITH DIFFERENTIAL/PLATELET
Abs Immature Granulocytes: 0.04 10*3/uL (ref 0.00–0.07)
Basophils Absolute: 0.1 10*3/uL (ref 0.0–0.1)
Basophils Relative: 1 %
Eosinophils Absolute: 0.1 10*3/uL (ref 0.0–0.5)
Eosinophils Relative: 2 %
HCT: 37.8 % — ABNORMAL LOW (ref 39.0–52.0)
Hemoglobin: 12.4 g/dL — ABNORMAL LOW (ref 13.0–17.0)
Immature Granulocytes: 1 %
Lymphocytes Relative: 14 %
Lymphs Abs: 1 10*3/uL (ref 0.7–4.0)
MCH: 30.2 pg (ref 26.0–34.0)
MCHC: 32.8 g/dL (ref 30.0–36.0)
MCV: 92 fL (ref 80.0–100.0)
Monocytes Absolute: 0.6 10*3/uL (ref 0.1–1.0)
Monocytes Relative: 8 %
Neutro Abs: 5.4 10*3/uL (ref 1.7–7.7)
Neutrophils Relative %: 74 %
Platelets: 201 10*3/uL (ref 150–400)
RBC: 4.11 MIL/uL — ABNORMAL LOW (ref 4.22–5.81)
RDW: 14.3 % (ref 11.5–15.5)
WBC: 7.1 10*3/uL (ref 4.0–10.5)
nRBC: 0 % (ref 0.0–0.2)

## 2022-05-16 LAB — COMPREHENSIVE METABOLIC PANEL
ALT: 6 U/L (ref 0–44)
AST: 9 U/L — ABNORMAL LOW (ref 15–41)
Albumin: 3.8 g/dL (ref 3.5–5.0)
Alkaline Phosphatase: 67 U/L (ref 38–126)
Anion gap: 5 (ref 5–15)
BUN: 17 mg/dL (ref 8–23)
CO2: 28 mmol/L (ref 22–32)
Calcium: 8.9 mg/dL (ref 8.9–10.3)
Chloride: 109 mmol/L (ref 98–111)
Creatinine, Ser: 1 mg/dL (ref 0.61–1.24)
GFR, Estimated: >60 mL/min (ref >60)
Glucose, Bld: 108 mg/dL — ABNORMAL HIGH (ref 70–99)
Potassium: 4.2 mmol/L (ref 3.5–5.1)
Sodium: 142 mmol/L (ref 135–145)
Total Bilirubin: 0.6 mg/dL (ref 0.3–1.2)
Total Protein: 7.1 g/dL (ref 6.5–8.1)

## 2022-05-23 LAB — BCR/ABL

## 2022-05-26 ENCOUNTER — Other Ambulatory Visit: Payer: Self-pay

## 2022-05-26 ENCOUNTER — Encounter: Payer: Self-pay | Admitting: Hematology and Oncology

## 2022-05-26 ENCOUNTER — Inpatient Hospital Stay: Payer: Medicare HMO | Admitting: Hematology and Oncology

## 2022-05-26 VITALS — BP 156/76 | HR 60 | Temp 97.8°F | Resp 18 | Ht 67.0 in | Wt 184.6 lb

## 2022-05-26 DIAGNOSIS — I255 Ischemic cardiomyopathy: Secondary | ICD-10-CM

## 2022-05-26 DIAGNOSIS — C9210 Chronic myeloid leukemia, BCR/ABL-positive, not having achieved remission: Secondary | ICD-10-CM | POA: Diagnosis not present

## 2022-05-26 DIAGNOSIS — T451X5A Adverse effect of antineoplastic and immunosuppressive drugs, initial encounter: Secondary | ICD-10-CM | POA: Diagnosis not present

## 2022-05-26 DIAGNOSIS — D6481 Anemia due to antineoplastic chemotherapy: Secondary | ICD-10-CM | POA: Diagnosis not present

## 2022-05-26 NOTE — Assessment & Plan Note (Signed)
We have reviewed recent blood work He is still in complete remission with major molecular response For now, he will continue his dose at 100 mg twice daily 

## 2022-05-26 NOTE — Assessment & Plan Note (Signed)
This is likely anemia of chronic disease. The patient denies recent history of bleeding such as epistaxis, hematuria or hematochezia. He is asymptomatic from the anemia. We will observe for now.  

## 2022-05-26 NOTE — Progress Notes (Signed)
[place] OFFICE PROGRESS NOTE  Patient Care Team: [name], MD as PCP - General (Family Medicine) Early Osmond, MD as PCP - Cardiology (Cardiology) [name]., MD as Attending Physician (Cardiology) [name], MD (Family Medicine) [name], MD (Cardiothoracic Surgery) Early, Arvilla Meres, MD as Attending Physician (Vascular Surgery) [name], MD as Consulting Physician (Hematology and Oncology) Deboraha Sprang, MD as Consulting Physician (Cardiology) [name], [facility] as Pharmacist (Pharmacist) [name], [facility] as Pharmacist (Pharmacist)  ASSESSMENT & PLAN:  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 The patient denies recent history of bleeding such as epistaxis, hematuria or hematochezia. He is asymptomatic from the anemia. We will observe for now.  Cardiomyopathy, ischemic He has no clinical signs or symptoms of congestive heart failure or fluid retention He will continue medical management  No orders of the defined types were placed in this encounter.   All questions were answered. The patient knows to call the clinic with any problems, questions or concerns. The total time spent in the appointment was 20 minutes encounter with patients including review of chart and various tests results, discussions about plan of care and coordination of care plan   [name], MD 05/26/2022 9:17 AM  INTERVAL HISTORY: Please see below for problem oriented charting. he returns for treatment follow-up on nilotinib for CML He is doing well He is active He has shortness of breath on moderate exertion only when he walks uphill No recent cough, chest pain or shortness of breath Denies leg swelling No  recent infection  REVIEW OF SYSTEMS:   Constitutional: Denies fevers, chills or abnormal weight loss Eyes: Denies blurriness of vision Ears, nose, mouth, throat, and face: Denies mucositis or sore throat Cardiovascular: Denies palpitation, chest discomfort or lower extremity swelling Gastrointestinal:  Denies nausea, heartburn or change in bowel habits Skin: Denies abnormal skin rashes Lymphatics: Denies new lymphadenopathy or easy bruising Neurological:Denies numbness, tingling or new weaknesses Behavioral/Psych: Mood is stable, no new changes  All other systems were reviewed with the patient and are negative.  I have reviewed the past medical history, past surgical history, social history and family history with the patient and they are unchanged from previous note.  ALLERGIES:  is allergic to breztri aerosphere [budeson-glycopyrrol-formoterol], norvasc [amlodipine besylate], and sulfa antibiotics.  MEDICATIONS:  Current Outpatient Medications  Medication Sig Dispense Refill   albuterol (VENTOLIN HFA) 108 (90 Base) MCG/ACT inhaler INHALE 2 PUFFS BY MOUTH EVERY 6 HOURS AS NEEDED FOR WHEEZING FOR SHORTNESS OF BREATH 18 g 11   ALPRAZolam (XANAX) 0.5 MG tablet Take 1 tablet (0.5 mg total) by mouth 3 (three) times daily as needed for anxiety or sleep. 30 tablet 0   apixaban (ELIQUIS) 5 MG TABS tablet Take 1 tablet (5 mg total) by mouth 2 (two) times daily. 60 tablet 6   atorvastatin (LIPITOR) 80 MG tablet TAKE 1 TABLET BY MOUTH ONCE DAILY AT  6  PM 90 tablet 3   carvedilol (COREG) 25 MG tablet Take 1 tablet (25 mg total) by mouth 2 (two) times daily. 180 tablet 3   clopidogrel (PLAVIX) 75 MG tablet Take 1 tablet by mouth once daily 90 tablet 3   diphenoxylate-atropine (LOMOTIL) 2.5-0.025 MG tablet Take 1 tablet by  mouth 4 (four) times daily as needed for diarrhea or loose stools. 30 tablet 0   empagliflozin (JARDIANCE) 10 MG TABS tablet Take 1 tablet (10 mg total) by mouth daily before  breakfast. 30 tablet 11   escitalopram (LEXAPRO) 10 MG tablet Take 1 tablet (10 mg total) by mouth daily. 30 tablet 5   ezetimibe (ZETIA) 10 MG tablet Take 1 tablet (10 mg total) by mouth daily. 90 tablet 3   furosemide (LASIX) 20 MG tablet Take 1 tablet (20 mg total) by mouth as needed for fluid or edema. (Patient taking differently: Take 40 mg by mouth daily.) 90 tablet 1   losartan (COZAAR) 25 MG tablet Take 1 tablet (25 mg total) by mouth at bedtime. (Patient not taking: Reported on 05/02/2022) 90 tablet 3   meclizine (ANTIVERT) 25 MG tablet Take 1 tablet (25 mg total) by mouth 3 (three) times daily as needed for dizziness. 30 tablet 0   Naphazoline HCl (CLEAR EYES OP) Place 1 drop into both eyes daily as needed (Runny eyes).     nilotinib (TASIGNA) 50 MG capsule Take 2 capsules (200 mg total) by mouth every 12 hours 120 capsule 3   sildenafil (VIAGRA) 100 MG tablet Take 0.5-1 tablets (50-100 mg total) by mouth daily as needed for erectile dysfunction. 5 tablet 11   Tiotropium Bromide-Olodaterol (STIOLTO RESPIMAT) 2.5-2.5 MCG/ACT AERS Inhale 2 puffs into the lungs every morning.     Xylometazoline HCl (4-WAY NASAL SPRAY NA) Place 1 spray into both nostrils daily as needed (Congestion).     No current facility-administered medications for this visit.    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 BCR/ABL by PCR detected abnormalitis with b2a2 & b3a2 subtypes   05/09/2012 - 03/16/2015 Chemotherapy   He was started on treatment with Dasatinib 100 mg daily   06/06/2012 Adverse Reaction   Dose of medication was reduced to 50 mg daily due to pancytopenia   01/24/2013 Progression   Patient was noted to have elevated blood count which and was subsequently found to be noncompliant to treatment. The patient has not been on treatment for several months because his prescription ran  out. He was restarted back on treatment   01/16/2014 Progression   Bcr/ABL by PCR is worse. Dose of Dasatinib was increased to 100 mg.   02/26/2014 Tumor Marker   Blood work for ABL kinase mutation was negative.   10/29/2014 Tumor Marker   BCR/ABL b2a2 & b3a2 0.29%, IS 0.1624%, not in MMR yet but improving   11/05/2014 Adverse Reaction   He had thoracentesis due to pleural effusion   11/27/2014 Surgery   He underwent right video-assisted thoracoscopy, Drainage of pleural effusion, Pleural biopsy, Diaphragm biopsy, Lung biopsy & [place] pleurodesis   01/30/2015 Tumor Marker   BCR/ABL b2a2 & b3a2 0.78%, IS 0.4368%, not in MMR    03/11/2015 Pathology Results   BCR/ABL b2a2 0.19%, IS 0.1064%, not in MMR    03/16/2015 Adverse Reaction   Dasatinib was stopped due to recurrent pleural effusion   03/28/2015 - 11/06/2017 Chemotherapy   He started on Bosutinib   03/30/2015 Procedure   He has therapeutic thoracentesis of the right lung with 1 liter of fluid removed   03/30/2015 Adverse Reaction   Bosutinib is placed on hold, to be restart on 8/25 at 250 mg due to severe diarrhea   04/28/2015 Tumor Marker  BCR/ABL b2a2 0.06%, IS 0.0336%, in MMR    06/10/2015 Tumor Marker   BCR/ABL b2a2 0.14%, IS 0.1162%, not in MMR    08/14/2015 Pathology Results   BCR/ABL b2a2 0.02%, IS 0.0166%, In MMR    11/09/2015 Tumor Marker   BCR/ABL b2a2 0.005%, IS 0.0042%, In MMR    02/08/2016 Tumor Marker   BCR/ABL undetectable. In MMR   06/13/2016 Tumor Marker   BCR/ABL undetectable. In MMR   12/06/2016 Pathology Results   BCR/ABL undetectable. In MMR   06/08/2017 Pathology Results   BCR/ABL undetectable. In MMR   09/08/2017 Pathology Results   BCR/ABL undetectable. In MMR   12/11/2017 Pathology Results   BCR/ABL undetectable. In MMR   03/19/2018 Pathology Results   BCR/ABL undetectable. In MMR   06/12/2018 Pathology Results   BCR/ABL undetectable. In MMR   09/06/2018 Pathology Results   BCR ABL is detectable. e13a2  (b2a2) and [id number]) IS: 0.009%   09/26/2018 -  Chemotherapy   The patient had Tasigna    11/21/2018 Pathology Results   BCR/ABL undetectable. In MMR   03/07/2019 Pathology Results   BCR/ABL undetectable. In MMR   06/17/2019 Pathology Results   BCR/ABL undetectable. In MMR   10/14/2019 Pathology Results   BCR ABL is detectable. e13a2 (b2a2) and P59F6(B8G6) IS: 0.0281%. In MMR   01/14/2020 Pathology Results   BCR ABL is detectable. e13a2 (b2a2) and K59D3(T7S1) IS: 0.023%. In MMR   10/23/2020 Pathology Results   BCR ABL is detectable. e13a2 (b2a2) and X79T9(Q3E0) IS: 0.0643%. In MMR   01/22/2021 Pathology Results   BCR ABL is detectable. e13a2 (b2a2) and P23R0(Q7M2) IS: 0.027%. In MMR   05/25/2021 Pathology Results   BCR ABL is detectable. e13a2 (b2a2) and U63F3(L4T6) IS: 0.0091%. In MMR   08/25/2021 Pathology Results   BCR ABL is detectable. e13a2 (b2a2) and Y56L8(L3T3) IS: 0.0039%. In MMR   12/13/2021 Pathology Results   BCR/ABL undetectable. In MMR   03/15/2022 Pathology Results   BCR/ABL undetectable. In MMR   05/16/2022 Pathology Results   BCR/ABL undetectable. In MMR       PHYSICAL EXAMINATION: ECOG PERFORMANCE STATUS: 1 - Symptomatic but completely ambulatory  Vitals:   05/26/22 0846  BP: (!) 156/76  Pulse: 60  Resp: 18  Temp: 97.8 F (36.6 C)  SpO2: 97%   Filed Weights   05/26/22 0846  Weight: 184 lb 9.6 oz (83.7 kg)    GENERAL:alert, no distress and comfortable SKIN: skin color, texture, turgor are normal, no rashes or significant lesions EYES: normal, Conjunctiva are pink and non-injected, sclera clear OROPHARYNX:no exudate, no erythema and lips, buccal mucosa, and tongue normal  NECK: supple, thyroid normal size, non-tender, without nodularity LYMPH:  no palpable lymphadenopathy in the cervical, axillary or inguinal LUNGS: clear to auscultation and percussion with normal breathing effort HEART: regular rate & rhythm and no murmurs and no lower extremity  edema ABDOMEN:abdomen soft, non-tender and normal bowel sounds Musculoskeletal:no cyanosis of digits and no clubbing  NEURO: alert & oriented x 3 with fluent speech, no focal motor/sensory deficits  LABORATORY DATA:  I have reviewed the data as listed    Component Value Date/Time   NA 142 05/16/2022 0856   NA 143 02/03/2022 0903   NA 142 06/08/2017 0832   K 4.2 05/16/2022 0856   K 3.9 06/08/2017 0832   CL 109 05/16/2022 0856   CL 110 (H) 11/14/2012 0918   CO2 28 05/16/2022 0856   CO2 25 06/08/2017 0832   GLUCOSE 108 (H)  05/16/2022 0856   GLUCOSE 98 06/08/2017 0832   GLUCOSE 108 (H) 11/14/2012 0918   BUN 17 05/16/2022 0856   BUN 10 02/03/2022 0903   BUN 11.6 06/08/2017 0832   CREATININE 1.00 05/16/2022 0856   CREATININE 1.13 01/22/2021 0840   CREATININE 0.90 05/31/2019 0837   CREATININE 0.9 06/08/2017 0832   CALCIUM 8.9 05/16/2022 0856   CALCIUM 8.6 06/08/2017 0832   PROT 7.1 05/16/2022 0856   PROT 6.8 02/03/2022 0903   PROT 6.7 06/08/2017 0832   ALBUMIN 3.8 05/16/2022 0856   ALBUMIN 4.1 02/03/2022 0903   ALBUMIN 3.4 (L) 06/08/2017 0832   AST 9 (L) 05/16/2022 0856   AST 13 (L) 01/22/2021 0840   AST 13 06/08/2017 0832   ALT 6 05/16/2022 0856   ALT 14 01/22/2021 0840   ALT 7 06/08/2017 0832   ALKPHOS 67 05/16/2022 0856   ALKPHOS 96 06/08/2017 0832   BILITOT 0.6 05/16/2022 0856   BILITOT 0.7 02/03/2022 0903   BILITOT 0.3 01/22/2021 0840   BILITOT 0.44 06/08/2017 0832   GFRNONAA >60 05/16/2022 0856   GFRNONAA >60 01/22/2021 0840   GFRNONAA 83 05/31/2019 0837   GFRAA >60 04/24/2020 0842   GFRAA 96 05/31/2019 0837    No results found for: "SPEP", "UPEP"  Lab Results  Component Value Date   WBC 7.1 05/16/2022   NEUTROABS 5.4 05/16/2022   HGB 12.4 (L) 05/16/2022   HCT 37.8 (L) 05/16/2022   MCV 92.0 05/16/2022   PLT 201 05/16/2022      Chemistry      Component Value Date/Time   NA 142 05/16/2022 0856   NA 143 02/03/2022 0903   NA 142 06/08/2017 0832   K  4.2 05/16/2022 0856   K 3.9 06/08/2017 0832   CL 109 05/16/2022 0856   CL 110 (H) 11/14/2012 0918   CO2 28 05/16/2022 0856   CO2 25 06/08/2017 0832   BUN 17 05/16/2022 0856   BUN 10 02/03/2022 0903   BUN 11.6 06/08/2017 0832   CREATININE 1.00 05/16/2022 0856   CREATININE 1.13 01/22/2021 0840   CREATININE 0.90 05/31/2019 0837   CREATININE 0.9 06/08/2017 0832      Component Value Date/Time   CALCIUM 8.9 05/16/2022 0856   CALCIUM 8.6 06/08/2017 0832   ALKPHOS 67 05/16/2022 0856   ALKPHOS 96 06/08/2017 0832   AST 9 (L) 05/16/2022 0856   AST 13 (L) 01/22/2021 0840   AST 13 06/08/2017 0832   ALT 6 05/16/2022 0856   ALT 14 01/22/2021 0840   ALT 7 06/08/2017 0832   BILITOT 0.6 05/16/2022 0856   BILITOT 0.7 02/03/2022 0903   BILITOT 0.3 01/22/2021 0840   BILITOT 0.44 06/08/2017 1194

## 2022-05-26 NOTE — Assessment & Plan Note (Signed)
He has no clinical signs or symptoms of congestive heart failure or fluid retention He will continue medical management

## 2022-05-30 ENCOUNTER — Ambulatory Visit (INDEPENDENT_AMBULATORY_CARE_PROVIDER_SITE_OTHER): Payer: Medicare HMO

## 2022-05-30 DIAGNOSIS — I472 Ventricular tachycardia, unspecified: Secondary | ICD-10-CM | POA: Diagnosis not present

## 2022-05-31 LAB — CUP PACEART REMOTE DEVICE CHECK
Battery Remaining Longevity: 35 mo
Battery Voltage: 2.97 V
Brady Statistic RV Percent Paced: 0.33 %
Date Time Interrogation Session: 20231023012504
HighPow Impedance: 50 Ohm
Implantable Lead Connection Status: 753985
Implantable Lead Implant Date: 20151014
Implantable Lead Location: 753860
Implantable Pulse Generator Implant Date: 20151014
Lead Channel Impedance Value: 361 Ohm
Lead Channel Impedance Value: 418 Ohm
Lead Channel Pacing Threshold Amplitude: 0.625 V
Lead Channel Pacing Threshold Pulse Width: 0.4 ms
Lead Channel Sensing Intrinsic Amplitude: 3.375 mV
Lead Channel Sensing Intrinsic Amplitude: 3.375 mV
Lead Channel Setting Pacing Amplitude: 2 V
Lead Channel Setting Pacing Pulse Width: 0.4 ms
Lead Channel Setting Sensing Sensitivity: 0.3 mV
Zone Setting Status: 755011

## 2022-06-08 ENCOUNTER — Telehealth: Payer: Self-pay | Admitting: Pharmacy Technician

## 2022-06-08 ENCOUNTER — Other Ambulatory Visit (HOSPITAL_COMMUNITY): Payer: Self-pay

## 2022-06-08 NOTE — Telephone Encounter (Signed)
Oral Oncology Patient Advocate Encounter   Received notification that patient is due for re-enrollment for assistance for Tasigna through [employer].   Re-enrollment process has been initiated and will be submitted upon completion of necessary documents.  Patient agreed to bring POI and sign documents on 06/14/22  [employer] phone number [phone].   I will continue to follow until final determination.  Anthony Wolfe, CPhT-Adv Oncology Pharmacy Patient [place] Direct Number: [phone]  Fax: [phone]

## 2022-06-09 ENCOUNTER — Telehealth: Payer: Self-pay | Admitting: Pharmacist

## 2022-06-09 NOTE — Chronic Care Management (AMB) (Signed)
Completed patient assistance forms for Jardiance and faxed in to program.  This should re-enroll patient through 2024.  Beverly Milch, PharmD, CPP Clinical Pharmacist Practitioner [place] [phone]

## 2022-06-14 ENCOUNTER — Other Ambulatory Visit (HOSPITAL_COMMUNITY): Payer: Self-pay

## 2022-06-14 NOTE — Telephone Encounter (Signed)
Oral Oncology Patient Advocate Encounter   Met with patient in lobby to collect paperwork requested by [employer].  [employer] submitted via fax to [phone] on 06/14/22.    [employer] phone number [phone].   I will continue to check the status until final determination.   Claire Medlin, CPhT-Adv Oncology Pharmacy Patient Advocate [facility] Direct Number: [phone]  Fax: [phone]   

## 2022-06-14 NOTE — Telephone Encounter (Signed)
Oral Oncology Patient Advocate Encounter  Prior Authorization for Anthony Wolfe has been approved.    PA# [id number] Effective dates: 06/14/2022 through 08/08/2023  Patients co-pay is $2,876.01.    [name], CPhT-Adv Oncology Pharmacy Patient [name] Direct Number: [phone]  Fax: [phone]

## 2022-06-15 ENCOUNTER — Telehealth: Payer: Self-pay | Admitting: Pharmacist

## 2022-06-15 NOTE — Progress Notes (Signed)
error 

## 2022-06-22 NOTE — Telephone Encounter (Signed)
Oral Oncology Patient Advocate Encounter   Received notification re-enrollment for assistance for Tasigna through [employer] has been approved. Patient may continue to receive their medication at $0 from this program.    [employer] phone number [phone].   Effective dates: 08/08/22 through 08/08/23  I have spoken to the patient.  [name], CPhT-Adv Oncology Pharmacy Patient [place] Direct Number: [phone]  Fax: [phone]

## 2022-06-24 NOTE — Progress Notes (Signed)
Remote ICD transmission.   

## 2022-06-27 ENCOUNTER — Telehealth: Payer: Self-pay

## 2022-06-27 NOTE — Telephone Encounter (Signed)
Received pt's Eliquis from [facility].  Eliquis 5 mg x 60 tablets each, 3 bottles in total. Called pt to advise and states he will be by today to pick up. Thank you!

## 2022-07-13 NOTE — Progress Notes (Signed)
Chronic Care Management Pharmacy Note  07/22/2022 Name:  Anthony Wolfe MRN:  [id number] DOB:  April 07, 1946  Summary: PharmD Fu visit.  He is back on atorvastatin and taking daily.  All of his meds are now covered through PAP.  At this time doing very well hunting a few times per week so he is active.  Continue to follow for help with PAP.  FU 6 months days   Subjective: Anthony Wolfe is an 76 y.o. year old male who is a primary patient of Pickard, Cammie Mcgee, MD.  The CCM team was consulted for assistance with disease management and care coordination needs.    Engaged with patient face to face for follow up visit in response to provider referral for pharmacy case management and/or care coordination services.   Consent to Services:  The patient was given the following information about Chronic Care Management services today, agreed to services, and gave verbal consent: 1. CCM service includes personalized support from designated clinical staff supervised by the primary care provider, including individualized plan of care and coordination with other care providers 2. 24/7 contact phone numbers for assistance for urgent and routine care needs. 3. Service will only be billed when office clinical staff spend 20 minutes or more in a month to coordinate care. 4. Only one practitioner may furnish and bill the service in a calendar month. 5.The patient may stop CCM services at any time (effective at the end of the month) by phone call to the office staff. 6. The patient will be responsible for cost sharing (co-pay) of up to 20% of the service fee (after annual deductible is met). Patient agreed to services and consent obtained.  Patient Care Team: [name], MD as PCP - General (Family Medicine) Early Osmond, MD as PCP - Cardiology (Cardiology) [name]., MD as Attending Physician (Cardiology) [name], MD (Family Medicine) [name], MD (Cardiothoracic  Surgery) Early, Arvilla Meres, MD as Attending Physician (Vascular Surgery) [name], MD as Consulting Physician (Hematology and Oncology) Deboraha Sprang, MD as Consulting Physician (Cardiology) [name], [facility] as Pharmacist (Pharmacist) [name], [facility] as Pharmacist (Pharmacist)  Recent office visits:  06/03/2021 Adele Dan, MD;  My biggest concern is keeping him on the [facility].  Therefore I gave him samples to last a month and I will set him up to meet with our clinical pharmacist to see if he can qualify for any prescription assistance programs.  If not we will transition him back to valsartan.  Start the patient on Celexa 20 mg a day   02/18/2021 OV (PCP) [name], MD;  I have asked the patient to check his blood pressure at home and if his systolic blood pressures consistently in the 140s I would like to increase the strength of the Entresto.   Recent consult visits:  02/01/2021 OV (Oncology) [name], MD; no medication changes indicated.   Hospital visits:  None in previous 6 months  Objective:  Lab Results  Component Value Date   CREATININE 1.00 05/16/2022   BUN 17 05/16/2022   GFR 60.62 06/23/2014   GFRNONAA >60 05/16/2022   GFRAA >60 04/24/2020   NA 142 05/16/2022   K 4.2 05/16/2022   CALCIUM 8.9 05/16/2022   CO2 28 05/16/2022   GLUCOSE 108 (H) 05/16/2022    Lab Results  Component Value Date/Time   HGBA1C 5.1 05/31/2019 08:37 AM   HGBA1C 5.5 08/12/2013 03:33 PM  GFR 60.62 06/23/2014 03:18 PM   GFR 55.47 (L) 06/16/2014 03:23 PM    Last diabetic Eye exam: No results found for: "HMDIABEYEEXA"  Last diabetic Foot exam: No results found for: "HMDIABFOOTEX"   Lab Results  Component Value Date   CHOL 137 02/03/2022   HDL 41 02/03/2022   LDLCALC 80 02/03/2022   TRIG 83 02/03/2022   CHOLHDL 3.3 02/03/2022       Latest Ref Rng & Units 05/16/2022    8:56 AM 03/15/2022    8:50 AM 02/03/2022    9:03 AM  Hepatic Function  Total  Protein 6.5 - 8.1 g/dL 7.1  7.0  6.8   Albumin 3.5 - 5.0 g/dL 3.8  3.8  4.1   AST 15 - 41 U/L _0 ALT 0 - 44 U/L _1 Alk Phosphatase 38 - 126 U/L 67  59  74   Total Bilirubin 0.3 - 1.2 mg/dL 0.6  0.6  0.7     Lab Results  Component Value Date/Time   TSH 3.250 02/03/2022 09:03 AM   TSH 2.510 03/15/2017 11:29 AM   TSH 2.03 06/02/2014 03:13 PM       Latest Ref Rng & Units 05/16/2022    8:56 AM 03/15/2022    8:50 AM 02/24/2022    2:46 PM  CBC  WBC 4.0 - 10.5 K/uL 7.1  7.2  7.1   Hemoglobin 13.0 - 17.0 g/dL 12.4  10.8  9.6   Hematocrit 39.0 - 52.0 % 37.8  33.3  28.7   Platelets 150 - 400 K/uL 201  226  232     No results found for: "VD25OH"  Clinical ASCVD: Yes  The ASCVD Risk score (Arnett DK, et al., 2019) failed to calculate for the following reasons:   The patient has a prior MI or stroke diagnosis   Financial Resource Strain: Low Risk  (11/11/2021)   Overall Financial Resource Strain (CARDIA)    Difficulty of Paying Living Expenses: Not hard at all       04/05/2022   11:51 AM 11/11/2021   10:50 AM 06/03/2021    8:05 AM  Depression screen PHQ 2/9  Decreased Interest 2 0 1  Down, Depressed, Hopeless 2 0 2  PHQ - 2 Score 4 0 3  Altered sleeping 3  2  Tired, decreased energy 3  2  Change in appetite 0  0  Feeling bad or failure about yourself  0  2  Trouble concentrating 0  2  Moving slowly or fidgety/restless 0  2  Suicidal thoughts 0  0  PHQ-9 Score 10  13  Difficult doing work/chores Somewhat difficult  Very difficult     Social History   Tobacco Use  Smoking Status Former   Packs/day: 3.00   Years: 45.00   Total pack years: 135.00   Types: Cigarettes   Quit date: 08/08/1997   Years since quitting: 24.9   Passive exposure: Past  Smokeless Tobacco Former   BP Readings from Last 3 Encounters:  05/26/22 (!) 156/76  05/02/22 124/72  04/05/22 132/68   Pulse Readings from Last 3 Encounters:  05/26/22 60  05/02/22 70  04/05/22 78   Wt  Readings from Last 3 Encounters:  05/26/22 184 lb 9.6 oz (83.7 kg)  05/02/22 179 lb (81.2 kg)  04/05/22 178 lb (80.7 kg)   BMI Readings from Last 3 Encounters:  05/26/22 28.91 kg/m  05/02/22 26.43 kg/m  04/05/22 26.29 kg/m    Assessment/Interventions: Review of patient past medical history, allergies, medications, health status, including review of consultants reports, laboratory and other test data, was performed as part of comprehensive evaluation and provision of chronic care management services.   SDOH:  (Social Determinants of Health) assessments and interventions performed: No, done within year Financial Resource Strain: Low Risk  (11/11/2021)   Overall Financial Resource Strain (CARDIA)    Difficulty of Paying Living Expenses: Not hard at all    SDOH Interventions    [place] Office Visit from 04/05/2022 in [place] from 11/11/2021 in [place] from 05/07/2021 in [place] Interventions     Food Insecurity Interventions -- Intervention Not Indicated Intervention Not Indicated  Housing Interventions -- Intervention Not Indicated Intervention Not Indicated  Transportation Interventions -- Intervention Not Indicated Intervention Not Indicated  Depression Interventions/Treatment  Medication -- --  Financial Strain Interventions -- Intervention Not Indicated Other (Comment)  [patient to bring up to Dr. Antony Contras attention about change in cost of medication]  Physical Activity Interventions -- Other (Comments)  [Increase as tolerated.] Intervention Not Indicated  Stress Interventions -- Intervention Not Indicated Intervention Not Indicated  Social Connections Interventions -- Intervention Not Indicated Intervention Not Indicated      Financial Resource Strain: Low Risk  (11/11/2021)   Overall Financial Resource Strain (CARDIA)    Difficulty of Paying Living Expenses: Not hard at all     [place]: No Food Insecurity (11/11/2021)  Housing: Low Risk  (11/11/2021)  Transportation Needs: No Transportation Needs (11/11/2021)  Alcohol Screen: Low Risk  (11/11/2021)  Depression (PHQ2-9): Medium Risk (04/05/2022)  Financial Resource Strain: Low Risk  (11/11/2021)  Physical Activity: Insufficiently Active (11/11/2021)  Social Connections: Socially Integrated (11/11/2021)  Stress: No Stress Concern Present (11/11/2021)  Tobacco Use: Medium Risk (05/26/2022)    CCM Care Plan  Allergies  Allergen Reactions   Breztri Aerosphere [Budeson-Glycopyrrol-Formoterol] Swelling    Swelling of mouth tongue    Norvasc [Amlodipine Besylate] Rash    rash   Sulfa Antibiotics Hives    Hives     Medications Reviewed Today     Reviewed by [name], MD (Physician) on 05/26/22 at [place] List Status: <None>   Medication Order Taking? Sig Documenting Provider Last Dose Status Informant  albuterol (VENTOLIN HFA) 108 (90 Base) MCG/ACT inhaler [id number] No INHALE 2 PUFFS BY MOUTH EVERY 6 HOURS AS NEEDED FOR WHEEZING FOR SHORTNESS OF BREATH [name], MD Taking Active Self  ALPRAZolam Duanne Moron) 0.5 MG tablet [id number] No Take 1 tablet (0.5 mg total) by mouth 3 (three) times daily as needed for anxiety or sleep. [name], MD Taking Active   apixaban (ELIQUIS) 5 MG TABS tablet [id number] No Take 1 tablet (5 mg total) by mouth 2 (two) times daily. Early Osmond, MD Taking Active Self  atorvastatin (LIPITOR) 80 MG tablet [id number] No TAKE 1 TABLET BY MOUTH ONCE DAILY AT  6  PM [name], MD Taking Active Self  carvedilol (COREG) 25 MG tablet [id number] No Take 1 tablet (25 mg total) by mouth 2 (two) times daily. Early Osmond, MD Taking Active Self  clopidogrel (PLAVIX) 75 MG tablet [id number] No Take 1 tablet by mouth once daily [name], MD Taking Active Self  diphenoxylate-atropine (LOMOTIL) 2.5-0.025 MG tablet [id number] No Take 1 tablet by mouth  4 (four) times daily as  needed for diarrhea or loose stools. [name], MD Taking Active Self  empagliflozin (JARDIANCE) 10 MG TABS tablet [id number] No Take 1 tablet (10 mg total) by mouth daily before breakfast. Early Osmond, MD Taking Active Self  escitalopram (LEXAPRO) 10 MG tablet [id number] No Take 1 tablet (10 mg total) by mouth daily. [name], MD Taking Active   ezetimibe (ZETIA) 10 MG tablet [id number] No Take 1 tablet (10 mg total) by mouth daily. Early Osmond, MD Taking Active   furosemide (LASIX) 20 MG tablet [id number] No Take 1 tablet (20 mg total) by mouth as needed for fluid or edema.  Patient taking differently: Take 40 mg by mouth daily.   Baldwin [place], PA-C Taking Active Self  losartan (COZAAR) 25 MG tablet [id number] No Take 1 tablet (25 mg total) by mouth at bedtime.  Patient not taking: Reported on 05/02/2022   Early Osmond, MD Not Taking Active Self  meclizine (ANTIVERT) 25 MG tablet [id number] No Take 1 tablet (25 mg total) by mouth 3 (three) times daily as needed for dizziness. [name], MD Taking Active Self  Naphazoline HCl (CLEAR EYES OP) [id number] No Place 1 drop into both eyes daily as needed (Runny eyes). [provider] Taking Active Self  nilotinib (TASIGNA) 50 MG capsule [id number] No Take 2 capsules (200 mg total) by mouth every 12 hours [name], MD Taking Active   sildenafil (VIAGRA) 100 MG tablet [id number] No Take 0.5-1 tablets (50-100 mg total) by mouth daily as needed for erectile dysfunction. [name], MD Taking Active Self  Tiotropium Bromide-Olodaterol (STIOLTO RESPIMAT) 2.5-2.5 MCG/ACT AERS [id number] No Inhale 2 puffs into the lungs every morning. [provider] Taking Active Self  Xylometazoline HCl (4-WAY NASAL SPRAY NA) [id number] No Place 1 spray into both nostrils daily as needed (Congestion). 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   Chronic anemia 09/21/2018   Anemia due to antineoplastic chemotherapy 11/17/2015   Protein-calorie malnutrition, mild ([place]) 11/17/2015   Congestive heart failure with cardiomyopathy ([place]) 06/25/2015   Chest skin lesion 04/06/2015   Pleural effusion on left 03/25/2015   Diarrhea due to drug 03/17/2015   Bradycardia 10/23/2014   Orthostatic hypotension 06/23/2014   Syncope 06/23/2014   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   CAD (coronary artery disease) of artery bypass graft, occluded VG-RCA 05/23/2014   NSTEMI (non-ST elevated myocardial infarction) ([place]) 05/21/2014   Cardiomyopathy, ischemic 05/21/2014   Ventricular tachycardia ([place]) 05/16/2014   Muscle spasm 05/28/2013   PVCs (premature ventricular contractions) 05/23/2013   Carotid disease, bilateral ([place])    Hyperlipidemia    HTN (hypertension)    Allergic rhinitis    Prostate hypertrophy    Anxiety    CAD (coronary artery disease), hx of CABG    TIA (transient ischemic attack)    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    Immunization History  Administered Date(s) Administered   Fluad Quad(high Dose 65+) 05/14/2019   Influenza, High Dose Seasonal PF 06/22/2018   Influenza,inj,Quad PF,6+ Mos 05/21/2013, 05/07/2014, 04/28/2015, 04/28/2016, 05/09/2017   Pneumococcal Conjugate-13 01/29/2015  Pneumococcal Polysaccharide-23 05/07/2014    Conditions to be addressed/monitored:  CAD, HTN, HX of TIA, CHF, COPD, Allergic Rhinitis, HLD, Anxiety  Care Plan : General Pharmacy (Adult)  Updates made by [name], RPH since 07/22/2022 12:00 AM     Problem: CAD,  HTN, HX of TIA, CHF, COPD, Allergic Rhinitis, HLD, Anxiety   Priority: High  Onset Date: 07/15/2021     Long-Range Goal: Patient-Specific Goal   Start Date: 07/15/2021  Expected End Date: 01/13/2022  Recent Progress: On track  Priority: High  Note:       Current Barriers:  None  Pharmacist Clinical Goal(s):  Patient will verbalize ability to afford treatment regimen achieve control of BP as evidenced by home monitoring adhere to plan to optimize therapeutic regimen for COPD as evidenced by report of adherence to recommended medication management changes through collaboration with PharmD and provider.   Interventions: 1:1 collaboration with [name], MD regarding development and update of comprehensive plan of care as evidenced by provider attestation and co-signature Inter-disciplinary care team collaboration (see longitudinal plan of care) Comprehensive medication review performed; medication list updated in electronic medical record  Hypertension (BP goal <130/80) -Uncontrolled -Current treatment: Carvedilol 63m BID -Medications previously tried: lisinopril, losartan, Toprol XL, propranolol, valsartan, verapamil  -Current home readings: 148/71 today, no other home readings provided - does have access to cuff whenever he needs -Current dietary habits: HIGH salt intake, eats [employer] for breakfast every morning, other meals are home cooked by his wife.  She cooks a lot of vegetables from garden and will add lean meats in sometimes.  Not much hamburger.  Does add salt to foods at home. -Current exercise habits: loves to walk around [employer], hunts regularly, active in garden during the summer months -Denies hypotensive/hypertensive symptoms - did have some dizziness a few weeks ago which has resolved. -Educated on BP goals and benefits of medications for prevention of heart attack, stroke and kidney damage; Daily salt intake goal < 2300 mg; Importance of home blood pressure  monitoring; Symptoms of hypotension and importance of maintaining adequate hydration; -Counseled to monitor BP at home daily, document, and provide log at future appointments -Counseled on diet and exercise extensively Recommended to continue current medication Recommended he monitor BP at home twice daily for 2 weeks.  Will have CMA call for BP check at that point and report to me. Adjust meds possibly at that time.  Hyperlipidemia: (LDL goal < 70) 07/21/22 -Not ideally controlled - most recent LDL is above goal at 80 -Current treatment: Atorvastatin 858mdaily Appropriate, Effective, Safe, Accessible -Medications previously tried: none noted  -Current dietary patterns: see HTN -Current exercise habits: see HTN -Educated on Cholesterol goals;  Benefits of statin for ASCVD risk reduction; -He had held statin for some time, confirms he is back on statin now - encouraged him to continue to be adherent to statin. No changes at this time - continue current meds  Update 10/14/21 High risk - high intensity statin is appropriate LDL at goal < 70. Recommend recheck lipids at 1 year from previous No changes at this time reinforced adherence.  Heart Failure (Goal: manage symptoms and prevent exacerbations) 07/22/22 -Controlled -Last ejection fraction: 40-45% -NYHA Class: II (slight limitation of activity) -AHA HF Stage: C (Heart disease and symptoms present) -Current treatment: Jardiance 1081maily Appropriate, Effective, Safe, Accessible Furosemide 81m74mery other day - he is only taking PRN Appropriate, Effective, Safe, Accessible Carvedilol 25mg18m Appropriate, Effective, Safe, Accessible Losartan 25mg63m  Appropriate, Effective, Safe, Accessible -Medications previously tried: losartan  -Current home BP/HR readings: no logs discussed today -Current dietary habits: see HTN - high salt content -He is not swelling, denies any SOB.   -Continues on same medications.  He reports that  Vania Rea is now covered he does not have to pay through 2024. Will continue to monitor.  Update 10/14/21 He has a visit with cardiologist this morning.  They scheduled an echocardiogram.  The plan was if EF was > 35% to stop Entresto and switch to Losartan.  They went ahead and started patient on Jardiance 78m daily.  He will begin taking tomorrow. Discussed the cost barriers that may come up with this, will leave application for BI cares at front desk he can pick up when he comes for PCP appointment. He denies any swelling or SOB - besides when he over exerts himself. No changes at this time - will FU after Echo which is scheduled for the 22nd.   COPD (Goal: control symptoms and prevent exacerbations) -Uncontrolled, not assessed -Current treatment  Albuterol HFA 940m prn -Medications previously tried: Breztri - swelling of mouth and tongue  -Current COPD Classification:  B (high sx, <2 exacerbations/yr) -Pulmonary function testing:Pulmonary Functions Testing Results: No results found for: FEV1, FVC, FEV1FVC, TLC, DLCO -Exacerbations requiring treatment in last 6 months: none -Patient denies consistent use of maintenance inhaler -Frequency of rescue inhaler use: 3-4 times per day -Counseled on Benefits of consistent maintenance inhaler use When to use rescue inhaler Differences between maintenance and rescue inhalers Possibility that mouth swelling was ICS induced thrush. -Recommended to continue current medication Based on symptom level I suspect patient would benefit from maintenance therapy for his COPD. As cost is an issue we will look at options with easier PAP programs.   Recommend Stiolto Respimat due to ease of delivery and application process.  Will discuss with PCP.   Patient Goals/Self-Care Activities Patient will:  - take medications as prescribed as evidenced by patient report and record review focus on medication adherence by pill box check blood pressure daily,  document, and provide at future appointments weigh daily, and contact provider if weight gain of 3 lbs in one day or 5 lbs in one week  Follow Up Plan: The care management team will reach out to the patient again over the next 90 days.            Medication Assistance: Application for Stiolto/Entresto  medication assistance program. in process.  Anticipated assistance start date unknown.  See plan of care for additional detail.  Compliance/Adherence/Medication fill history: Care Gaps: None  Star-Rating Drugs: Atorvastatin 8034maily 07/17/22 90ds Losartan 81m59m/29/23 90ds  Patient's preferred pharmacy is:  [facility] [place] - [address] [place] [id number]: [phone]: [phone] [place] [facility] [place]: [phone]: [phone] [address] [id number]: [phone]: [phone] pill box? Yes Pt endorses 100% compliance  We discussed: Benefits of medication synchronization, packaging and delivery as well as enhanced pharmacist oversight with [employer]. Patient decided to: Continue current medication management strategy  Care Plan and Follow Up Patient Decision:  Patient agrees to Care Plan and Follow-up.  Plan: The care management team will reach  out to the patient again over the next 90 days.  Beverly Milch, PharmD, CPP Clinical Pharmacist Practitioner [place] [phone]

## 2022-07-21 ENCOUNTER — Ambulatory Visit: Payer: Medicare HMO | Admitting: Pharmacist

## 2022-07-21 DIAGNOSIS — E785 Hyperlipidemia, unspecified: Secondary | ICD-10-CM

## 2022-07-21 DIAGNOSIS — I5020 Unspecified systolic (congestive) heart failure: Secondary | ICD-10-CM

## 2022-07-22 NOTE — Patient Instructions (Addendum)
Visit Information   Goals Addressed             This Visit's Progress    Track and Manage My Blood Pressure-Hypertension   On track    Timeframe:  Long-Range Goal Priority:  High Start Date:   07/15/21                          Expected End Date:  01/13/22                     Follow Up Date 10/13/21    - check blood pressure daily - choose a place to take my blood pressure (home, clinic or office, retail store) - write blood pressure results in a log or diary    Why is this important?   You won't feel high blood pressure, but it can still hurt your blood vessels.  High blood pressure can cause heart or kidney problems. It can also cause a stroke.  Making lifestyle changes like losing a little weight or eating less salt will help.  Checking your blood pressure at home and at different times of the day can help to control blood pressure.  If the doctor prescribes medicine remember to take it the way the doctor ordered.  Call the office if you cannot afford the medicine or if there are questions about it.     Notes: CMA to call in 2 weeks       Patient Care Plan: General Pharmacy (Adult)     Problem Identified: CAD, HTN, HX of TIA, CHF, COPD, Allergic Rhinitis, HLD, Anxiety   Priority: High  Onset Date: 07/15/2021     Long-Range Goal: Patient-Specific Goal   Start Date: 07/15/2021  Expected End Date: 01/13/2022  Recent Progress: On track  Priority: High  Note:       Current Barriers:  None  Pharmacist Clinical Goal(s):  Patient will verbalize ability to afford treatment regimen achieve control of BP as evidenced by home monitoring adhere to plan to optimize therapeutic regimen for COPD as evidenced by report of adherence to recommended medication management changes through collaboration with PharmD and provider.   Interventions: 1:1 collaboration with [name], MD regarding development and update of comprehensive plan of care as evidenced by provider  attestation and co-signature Inter-disciplinary care team collaboration (see longitudinal plan of care) Comprehensive medication review performed; medication list updated in electronic medical record  Hypertension (BP goal <130/80) -Uncontrolled -Current treatment: Carvedilol '25mg'$  BID -Medications previously tried: lisinopril, losartan, Toprol XL, propranolol, valsartan, verapamil  -Current home readings: 148/71 today, no other home readings provided - does have access to cuff whenever he needs -Current dietary habits: HIGH salt intake, eats [employer] for breakfast every morning, other meals are home cooked by his wife.  She cooks a lot of vegetables from garden and will add lean meats in sometimes.  Not much hamburger.  Does add salt to foods at home. -Current exercise habits: loves to walk around [employer], hunts regularly, active in garden during the summer months -Denies hypotensive/hypertensive symptoms - did have some dizziness a few weeks ago which has resolved. -Educated on BP goals and benefits of medications for prevention of heart attack, stroke and kidney damage; Daily salt intake goal < 2300 mg; Importance of home blood pressure monitoring; Symptoms of hypotension and importance of maintaining adequate hydration; -Counseled to monitor BP at home daily, document, and provide log at future appointments -Counseled on diet  and exercise extensively Recommended to continue current medication Recommended he monitor BP at home twice daily for 2 weeks.  Will have CMA call for BP check at that point and report to me. Adjust meds possibly at that time.  Hyperlipidemia: (LDL goal < 70) 07/21/22 -Not ideally controlled - most recent LDL is above goal at 80 -Current treatment: Atorvastatin '80mg'$  daily Appropriate, Effective, Safe, Accessible -Medications previously tried: none noted  -Current dietary patterns: see HTN -Current exercise habits: see HTN -Educated on Cholesterol goals;   Benefits of statin for ASCVD risk reduction; -He had held statin for some time, confirms he is back on statin now - encouraged him to continue to be adherent to statin. No changes at this time - continue current meds  Update 10/14/21 High risk - high intensity statin is appropriate LDL at goal < 70. Recommend recheck lipids at 1 year from previous No changes at this time reinforced adherence.  Heart Failure (Goal: manage symptoms and prevent exacerbations) 07/22/22 -Controlled -Last ejection fraction: 40-45% -NYHA Class: II (slight limitation of activity) -AHA HF Stage: C (Heart disease and symptoms present) -Current treatment: Jardiance '10mg'$  daily Appropriate, Effective, Safe, Accessible Furosemide '20mg'$  every other day - he is only taking PRN Appropriate, Effective, Safe, Accessible Carvedilol '25mg'$  BID Appropriate, Effective, Safe, Accessible Losartan '25mg'$  Appropriate, Effective, Safe, Accessible -Medications previously tried: losartan  -Current home BP/HR readings: no logs discussed today -Current dietary habits: see HTN - high salt content -He is not swelling, denies any SOB.   -Continues on same medications.  He reports that Vania Rea is now covered he does not have to pay through 2024. Will continue to monitor.  Update 10/14/21 He has a visit with cardiologist this morning.  They scheduled an echocardiogram.  The plan was if EF was > 35% to stop Entresto and switch to Losartan.  They went ahead and started patient on Jardiance '10mg'$  daily.  He will begin taking tomorrow. Discussed the cost barriers that may come up with this, will leave application for BI cares at front desk he can pick up when he comes for PCP appointment. He denies any swelling or SOB - besides when he over exerts himself. No changes at this time - will FU after Echo which is scheduled for the 22nd.   COPD (Goal: control symptoms and prevent exacerbations) -Uncontrolled, not assessed -Current treatment   Albuterol HFA 32mg prn -Medications previously tried: Breztri - swelling of mouth and tongue  -Current COPD Classification:  B (high sx, <2 exacerbations/yr) -Pulmonary function testing:Pulmonary Functions Testing Results: No results found for: FEV1, FVC, FEV1FVC, TLC, DLCO -Exacerbations requiring treatment in last 6 months: none -Patient denies consistent use of maintenance inhaler -Frequency of rescue inhaler use: 3-4 times per day -Counseled on Benefits of consistent maintenance inhaler use When to use rescue inhaler Differences between maintenance and rescue inhalers Possibility that mouth swelling was ICS induced thrush. -Recommended to continue current medication Based on symptom level I suspect patient would benefit from maintenance therapy for his COPD. As cost is an issue we will look at options with easier PAP programs.   Recommend Stiolto Respimat due to ease of delivery and application process.  Will discuss with PCP.   Patient Goals/Self-Care Activities Patient will:  - take medications as prescribed as evidenced by patient report and record review focus on medication adherence by pill box check blood pressure daily, document, and provide at future appointments weigh daily, and contact provider if weight gain of 3 lbs in one  day or 5 lbs in one week  Follow Up Plan: The care management team will reach out to the patient again over the next 90 days.           The patient verbalized understanding of instructions, educational materials, and care plan provided today and DECLINED offer to receive copy of patient instructions, educational materials, and care plan.  Telephone follow up appointment with pharmacy team member scheduled for: 6 months  [name], [place], PharmD, [place] Clinical Pharmacist Practitioner [place] [phone]

## 2022-08-16 ENCOUNTER — Other Ambulatory Visit: Payer: Medicare HMO

## 2022-08-16 ENCOUNTER — Other Ambulatory Visit (HOSPITAL_COMMUNITY): Payer: Self-pay

## 2022-08-17 ENCOUNTER — Inpatient Hospital Stay: Payer: Medicare HMO | Attending: Hematology and Oncology

## 2022-08-17 DIAGNOSIS — Z79899 Other long term (current) drug therapy: Secondary | ICD-10-CM | POA: Diagnosis not present

## 2022-08-17 DIAGNOSIS — I255 Ischemic cardiomyopathy: Secondary | ICD-10-CM | POA: Insufficient documentation

## 2022-08-17 DIAGNOSIS — D6481 Anemia due to antineoplastic chemotherapy: Secondary | ICD-10-CM | POA: Insufficient documentation

## 2022-08-17 DIAGNOSIS — C9210 Chronic myeloid leukemia, BCR/ABL-positive, not having achieved remission: Secondary | ICD-10-CM | POA: Insufficient documentation

## 2022-08-17 LAB — COMPREHENSIVE METABOLIC PANEL
ALT: 11 U/L (ref 0–44)
AST: 13 U/L — ABNORMAL LOW (ref 15–41)
Albumin: 4 g/dL (ref 3.5–5.0)
Alkaline Phosphatase: 71 U/L (ref 38–126)
Anion gap: 5 (ref 5–15)
BUN: 18 mg/dL (ref 8–23)
CO2: 28 mmol/L (ref 22–32)
Calcium: 9.1 mg/dL (ref 8.9–10.3)
Chloride: 110 mmol/L (ref 98–111)
Creatinine, Ser: 0.98 mg/dL (ref 0.61–1.24)
GFR, Estimated: >60 mL/min (ref >60)
Glucose, Bld: 92 mg/dL (ref 70–99)
Potassium: 4.3 mmol/L (ref 3.5–5.1)
Sodium: 143 mmol/L (ref 135–145)
Total Bilirubin: 0.5 mg/dL (ref 0.3–1.2)
Total Protein: 7 g/dL (ref 6.5–8.1)

## 2022-08-17 LAB — CBC WITH DIFFERENTIAL/PLATELET
Abs Immature Granulocytes: 0.03 10*3/uL (ref 0.00–0.07)
Basophils Absolute: 0.1 10*3/uL (ref 0.0–0.1)
Basophils Relative: 1 %
Eosinophils Absolute: 0.1 10*3/uL (ref 0.0–0.5)
Eosinophils Relative: 2 %
HCT: 37.4 % — ABNORMAL LOW (ref 39.0–52.0)
Hemoglobin: 12.3 g/dL — ABNORMAL LOW (ref 13.0–17.0)
Immature Granulocytes: 0 %
Lymphocytes Relative: 17 %
Lymphs Abs: 1.2 10*3/uL (ref 0.7–4.0)
MCH: 30.9 pg (ref 26.0–34.0)
MCHC: 32.9 g/dL (ref 30.0–36.0)
MCV: 94 fL (ref 80.0–100.0)
Monocytes Absolute: 0.6 10*3/uL (ref 0.1–1.0)
Monocytes Relative: 9 %
Neutro Abs: 5.1 10*3/uL (ref 1.7–7.7)
Neutrophils Relative %: 71 %
Platelets: 177 10*3/uL (ref 150–400)
RBC: 3.98 MIL/uL — ABNORMAL LOW (ref 4.22–5.81)
RDW: 14.7 % (ref 11.5–15.5)
WBC: 7.1 10*3/uL (ref 4.0–10.5)
nRBC: 0 % (ref 0.0–0.2)

## 2022-08-25 LAB — BCR/ABL

## 2022-08-26 ENCOUNTER — Inpatient Hospital Stay: Payer: Medicare HMO | Admitting: Hematology and Oncology

## 2022-08-26 ENCOUNTER — Other Ambulatory Visit: Payer: Self-pay

## 2022-08-26 ENCOUNTER — Encounter: Payer: Self-pay | Admitting: Hematology and Oncology

## 2022-08-26 VITALS — BP 129/71 | HR 104 | Temp 97.7°F | Resp 18 | Ht 67.0 in | Wt 187.6 lb

## 2022-08-26 DIAGNOSIS — I255 Ischemic cardiomyopathy: Secondary | ICD-10-CM

## 2022-08-26 DIAGNOSIS — C9210 Chronic myeloid leukemia, BCR/ABL-positive, not having achieved remission: Secondary | ICD-10-CM | POA: Diagnosis not present

## 2022-08-26 DIAGNOSIS — T451X5A Adverse effect of antineoplastic and immunosuppressive drugs, initial encounter: Secondary | ICD-10-CM

## 2022-08-26 DIAGNOSIS — D6481 Anemia due to antineoplastic chemotherapy: Secondary | ICD-10-CM | POA: Diagnosis not present

## 2022-08-26 DIAGNOSIS — Z79899 Other long term (current) drug therapy: Secondary | ICD-10-CM | POA: Diagnosis not present

## 2022-08-26 MED ORDER — DIPHENOXYLATE-ATROPINE 2.5-0.025 MG PO TABS: 1.0000 | ORAL_TABLET | Freq: Four times a day (QID) | ORAL | 0 refills | Status: DC | PRN

## 2022-08-26 NOTE — Progress Notes (Signed)
[place] OFFICE PROGRESS NOTE  Patient Care Team: [name], MD as PCP - General (Family Medicine) Early Osmond, MD as PCP - Cardiology (Cardiology) [name]., MD as Attending Physician (Cardiology) [name], MD (Family Medicine) [name], MD (Cardiothoracic Surgery) Early, Arvilla Meres, MD as Attending Physician (Vascular Surgery) [name], MD as Consulting Physician (Hematology and Oncology) Deboraha Sprang, MD as Consulting Physician (Cardiology) [name], [facility] as Pharmacist (Pharmacist) [name], [facility] as Pharmacist (Pharmacist)  ASSESSMENT & PLAN:  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 The patient denies recent history of bleeding such as epistaxis, hematuria or hematochezia. He is asymptomatic from the anemia. We will observe for now.  Cardiomyopathy, ischemic He has no clinical signs or symptoms of congestive heart failure or signs of fluid retention We will continue to monitor closely He will continue medical management as recommended by his cardiologist  No orders of the defined types were placed in this encounter.   All questions were answered. The patient knows to call the clinic with any problems, questions or concerns. The total time spent in the appointment was 20 minutes encounter with patients including review of chart and various tests results, discussions about plan of care and coordination of care plan   [name], MD 08/26/2022 8:59 AM  INTERVAL HISTORY: Please see below for problem oriented charting. he returns for treatment follow-up on the lab today for CML He is compliant taking his medications as directed No recent signs or symptoms of fluid  retention No recent infection He had recent unpleasant side effects from citalopram that was prescribed for anxiety.  After the patient stopped taking it, his symptoms has resolved.  He denies further anxiety He has occasional rare diarrhea due to poor food choices intermittently, unrelated to his treatment or medications  REVIEW OF SYSTEMS:   Constitutional: Denies fevers, chills or abnormal weight loss Eyes: Denies blurriness of vision Ears, nose, mouth, throat, and face: Denies mucositis or sore throat Respiratory: Denies cough, dyspnea or wheezes Cardiovascular: Denies palpitation, chest discomfort or lower extremity swelling Skin: Denies abnormal skin rashes Lymphatics: Denies new lymphadenopathy or easy bruising Neurological:Denies numbness, tingling or new weaknesses Behavioral/Psych: Mood is stable, no new changes  All other systems were reviewed with the patient and are negative.  I have reviewed the past medical history, past surgical history, social history and family history with the patient and they are unchanged from previous note.  ALLERGIES:  is allergic to breztri aerosphere [budeson-glycopyrrol-formoterol], norvasc [amlodipine besylate], and sulfa antibiotics.  MEDICATIONS:  Current Outpatient Medications  Medication Sig Dispense Refill   albuterol (VENTOLIN HFA) 108 (90 Base) MCG/ACT inhaler INHALE 2 PUFFS BY MOUTH EVERY 6 HOURS AS NEEDED FOR WHEEZING FOR SHORTNESS OF BREATH 18 g 11   ALPRAZolam (XANAX) 0.5 MG tablet Take 1 tablet (0.5 mg total) by mouth 3 (three) times daily as needed for anxiety or sleep. 30 tablet 0   apixaban (ELIQUIS) 5 MG TABS tablet Take 1 tablet (5 mg total) by mouth 2 (two) times daily. 60 tablet 6   atorvastatin (LIPITOR) 80 MG tablet TAKE 1 TABLET BY MOUTH ONCE DAILY AT  6  PM 90 tablet 3   carvedilol (COREG) 25 MG tablet Take 1  tablet (25 mg total) by mouth 2 (two) times daily. 180 tablet 3   clopidogrel (PLAVIX) 75 MG tablet Take 1 tablet  by mouth once daily 90 tablet 3   diphenoxylate-atropine (LOMOTIL) 2.5-0.025 MG tablet Take 1 tablet by mouth 4 (four) times daily as needed for diarrhea or loose stools. 30 tablet 0   empagliflozin (JARDIANCE) 10 MG TABS tablet Take 1 tablet (10 mg total) by mouth daily before breakfast. 30 tablet 11   ezetimibe (ZETIA) 10 MG tablet Take 1 tablet (10 mg total) by mouth daily. 90 tablet 3   furosemide (LASIX) 20 MG tablet Take 1 tablet (20 mg total) by mouth as needed for fluid or edema. (Patient taking differently: Take 40 mg by mouth daily.) 90 tablet 1   losartan (COZAAR) 25 MG tablet Take 1 tablet (25 mg total) by mouth at bedtime. 90 tablet 3   meclizine (ANTIVERT) 25 MG tablet Take 1 tablet (25 mg total) by mouth 3 (three) times daily as needed for dizziness. 30 tablet 0   Naphazoline HCl (CLEAR EYES OP) Place 1 drop into both eyes daily as needed (Runny eyes).     nilotinib (TASIGNA) 50 MG capsule Take 2 capsules (200 mg total) by mouth every 12 hours 120 capsule 3   sildenafil (VIAGRA) 100 MG tablet Take 0.5-1 tablets (50-100 mg total) by mouth daily as needed for erectile dysfunction. 5 tablet 11   Tiotropium Bromide-Olodaterol (STIOLTO RESPIMAT) 2.5-2.5 MCG/ACT AERS Inhale 2 puffs into the lungs every morning.     Xylometazoline HCl (4-WAY NASAL SPRAY NA) Place 1 spray into both nostrils daily as needed (Congestion).     No current facility-administered medications for this visit.    SUMMARY OF ONCOLOGIC HISTORY: Oncology History  Chronic myelogenous leukemia ([place])  05/04/2012 Bone Marrow Biopsy   BM confirmed diagnosis of CML in Chronic phase. BCR/ABL by PCR detected abnormalitis with b2a2 & b3a2 subtypes   05/09/2012 - 03/16/2015 Chemotherapy   He was started on treatment with Dasatinib 100 mg daily   06/06/2012 Adverse Reaction   Dose of medication was reduced to 50 mg daily due to pancytopenia   01/24/2013 Progression   Patient was noted to have elevated blood count which and  was subsequently found to be noncompliant to treatment. The patient has not been on treatment for several months because his prescription ran out. He was restarted back on treatment   01/16/2014 Progression   Bcr/ABL by PCR is worse. Dose of Dasatinib was increased to 100 mg.   02/26/2014 Tumor Marker   Blood work for ABL kinase mutation was negative.   10/29/2014 Tumor Marker   BCR/ABL b2a2 & b3a2 0.29%, IS 0.1624%, not in MMR yet but improving   11/05/2014 Adverse Reaction   He had thoracentesis due to pleural effusion   11/27/2014 Surgery   He underwent right video-assisted thoracoscopy, Drainage of pleural effusion, Pleural biopsy, Diaphragm biopsy, Lung biopsy & [place] pleurodesis   01/30/2015 Tumor Marker   BCR/ABL b2a2 & b3a2 0.78%, IS 0.4368%, not in MMR    03/11/2015 Pathology Results   BCR/ABL b2a2 0.19%, IS 0.1064%, not in MMR    03/16/2015 Adverse Reaction   Dasatinib was stopped due to recurrent pleural effusion   03/28/2015 - 11/06/2017 Chemotherapy   He started on Bosutinib   03/30/2015 Procedure   He has therapeutic thoracentesis of the right lung with 1 liter of fluid removed   03/30/2015 Adverse Reaction   Bosutinib is placed on hold, to be restart  on 8/25 at 250 mg due to severe diarrhea   04/28/2015 Tumor Marker   BCR/ABL b2a2 0.06%, IS 0.0336%, in MMR    06/10/2015 Tumor Marker   BCR/ABL b2a2 0.14%, IS 0.1162%, not in MMR    08/14/2015 Pathology Results   BCR/ABL b2a2 0.02%, IS 0.0166%, In MMR    11/09/2015 Tumor Marker   BCR/ABL b2a2 0.005%, IS 0.0042%, In MMR    02/08/2016 Tumor Marker   BCR/ABL undetectable. In MMR   06/13/2016 Tumor Marker   BCR/ABL undetectable. In MMR   12/06/2016 Pathology Results   BCR/ABL undetectable. In MMR   06/08/2017 Pathology Results   BCR/ABL undetectable. In MMR   09/08/2017 Pathology Results   BCR/ABL undetectable. In MMR   12/11/2017 Pathology Results   BCR/ABL undetectable. In MMR   03/19/2018 Pathology Results   BCR/ABL  undetectable. In MMR   06/12/2018 Pathology Results   BCR/ABL undetectable. In MMR   09/06/2018 Pathology Results   BCR ABL is detectable. e13a2 (b2a2) and [id number]) IS: 0.009%   09/26/2018 -  Chemotherapy   The patient had Tasigna    11/21/2018 Pathology Results   BCR/ABL undetectable. In MMR   03/07/2019 Pathology Results   BCR/ABL undetectable. In MMR   06/17/2019 Pathology Results   BCR/ABL undetectable. In MMR   10/14/2019 Pathology Results   BCR ABL is detectable. e13a2 (b2a2) and D63O7(F6E3) IS: 0.0281%. In MMR   01/14/2020 Pathology Results   BCR ABL is detectable. e13a2 (b2a2) and P29J1(O8C1) IS: 0.023%. In MMR   10/23/2020 Pathology Results   BCR ABL is detectable. e13a2 (b2a2) and Y60Y3(K1S0) IS: 0.0643%. In MMR   01/22/2021 Pathology Results   BCR ABL is detectable. e13a2 (b2a2) and F09N2(T5T7) IS: 0.027%. In MMR   05/25/2021 Pathology Results   BCR ABL is detectable. e13a2 (b2a2) and D22G2(R4Y7) IS: 0.0091%. In MMR   08/25/2021 Pathology Results   BCR ABL is detectable. e13a2 (b2a2) and C62B7(S2G3) IS: 0.0039%. In MMR   12/13/2021 Pathology Results   BCR/ABL undetectable. In MMR   03/15/2022 Pathology Results   BCR/ABL undetectable. In MMR   05/16/2022 Pathology Results   BCR/ABL undetectable. In MMR     08/17/2022 Pathology Results   BCR/ABL undetectable. In MMR      PHYSICAL EXAMINATION: ECOG PERFORMANCE STATUS: 0 - Asymptomatic  Vitals:   08/26/22 0854  BP: 129/71  Pulse: (!) 104  Resp: 18  Temp: 97.7 F (36.5 C)  SpO2: 100%   Filed Weights   08/26/22 0854  Weight: 187 lb 9.6 oz (85.1 kg)    GENERAL:alert, no distress and comfortable SKIN: skin color, texture, turgor are normal, no rashes or significant lesions EYES: normal, Conjunctiva are pink and non-injected, sclera clear OROPHARYNX:no exudate, no erythema and lips, buccal mucosa, and tongue normal  NECK: supple, thyroid normal size, non-tender, without nodularity LYMPH:  no palpable  lymphadenopathy in the cervical, axillary or inguinal LUNGS: clear to auscultation and percussion with normal breathing effort HEART: regular rate & rhythm and no murmurs and no lower extremity edema ABDOMEN:abdomen soft, non-tender and normal bowel sounds Musculoskeletal:no cyanosis of digits and no clubbing  NEURO: alert & oriented x 3 with fluent speech, no focal motor/sensory deficits  LABORATORY DATA:  I have reviewed the data as listed    Component Value Date/Time   NA 143 08/17/2022 0841   NA 143 02/03/2022 0903   NA 142 06/08/2017 0832   K 4.3 08/17/2022 0841   K 3.9 06/08/2017 0832   CL 110 08/17/2022  0841   CL 110 (H) 11/14/2012 0918   CO2 28 08/17/2022 0841   CO2 25 06/08/2017 0832   GLUCOSE 92 08/17/2022 0841   GLUCOSE 98 06/08/2017 0832   GLUCOSE 108 (H) 11/14/2012 0918   BUN 18 08/17/2022 0841   BUN 10 02/03/2022 0903   BUN 11.6 06/08/2017 0832   CREATININE 0.98 08/17/2022 0841   CREATININE 1.13 01/22/2021 0840   CREATININE 0.90 05/31/2019 0837   CREATININE 0.9 06/08/2017 0832   CALCIUM 9.1 08/17/2022 0841   CALCIUM 8.6 06/08/2017 0832   PROT 7.0 08/17/2022 0841   PROT 6.8 02/03/2022 0903   PROT 6.7 06/08/2017 0832   ALBUMIN 4.0 08/17/2022 0841   ALBUMIN 4.1 02/03/2022 0903   ALBUMIN 3.4 (L) 06/08/2017 0832   AST 13 (L) 08/17/2022 0841   AST 13 (L) 01/22/2021 0840   AST 13 06/08/2017 0832   ALT 11 08/17/2022 0841   ALT 14 01/22/2021 0840   ALT 7 06/08/2017 0832   ALKPHOS 71 08/17/2022 0841   ALKPHOS 96 06/08/2017 0832   BILITOT 0.5 08/17/2022 0841   BILITOT 0.7 02/03/2022 0903   BILITOT 0.3 01/22/2021 0840   BILITOT 0.44 06/08/2017 0832   GFRNONAA >60 08/17/2022 0841   GFRNONAA >60 01/22/2021 0840   GFRNONAA 83 05/31/2019 0837   GFRAA >60 04/24/2020 0842   GFRAA 96 05/31/2019 0837    No results found for: "SPEP", "UPEP"  Lab Results  Component Value Date   WBC 7.1 08/17/2022   NEUTROABS 5.1 08/17/2022   HGB 12.3 (L) 08/17/2022   HCT 37.4  (L) 08/17/2022   MCV 94.0 08/17/2022   PLT 177 08/17/2022      Chemistry      Component Value Date/Time   NA 143 08/17/2022 0841   NA 143 02/03/2022 0903   NA 142 06/08/2017 0832   K 4.3 08/17/2022 0841   K 3.9 06/08/2017 0832   CL 110 08/17/2022 0841   CL 110 (H) 11/14/2012 0918   CO2 28 08/17/2022 0841   CO2 25 06/08/2017 0832   BUN 18 08/17/2022 0841   BUN 10 02/03/2022 0903   BUN 11.6 06/08/2017 0832   CREATININE 0.98 08/17/2022 0841   CREATININE 1.13 01/22/2021 0840   CREATININE 0.90 05/31/2019 0837   CREATININE 0.9 06/08/2017 0832      Component Value Date/Time   CALCIUM 9.1 08/17/2022 0841   CALCIUM 8.6 06/08/2017 0832   ALKPHOS 71 08/17/2022 0841   ALKPHOS 96 06/08/2017 0832   AST 13 (L) 08/17/2022 0841   AST 13 (L) 01/22/2021 0840   AST 13 06/08/2017 0832   ALT 11 08/17/2022 0841   ALT 14 01/22/2021 0840   ALT 7 06/08/2017 0832   BILITOT 0.5 08/17/2022 0841   BILITOT 0.7 02/03/2022 0903   BILITOT 0.3 01/22/2021 0840   BILITOT 0.44 06/08/2017 7106

## 2022-08-26 NOTE — Assessment & Plan Note (Signed)
We have reviewed recent blood work He is still in complete remission with major molecular response For now, he will continue his dose at 100 mg twice daily

## 2022-08-26 NOTE — Assessment & Plan Note (Signed)
He has no clinical signs or symptoms of congestive heart failure or signs of fluid retention We will continue to monitor closely He will continue medical management as recommended by his cardiologist

## 2022-08-26 NOTE — Assessment & Plan Note (Signed)
This is likely anemia of chronic disease. The patient denies recent history of bleeding such as epistaxis, hematuria or hematochezia. He is asymptomatic from the anemia. We will observe for now.  

## 2022-08-29 ENCOUNTER — Ambulatory Visit: Payer: Medicare HMO | Attending: Internal Medicine

## 2022-08-29 DIAGNOSIS — I472 Ventricular tachycardia, unspecified: Secondary | ICD-10-CM | POA: Diagnosis not present

## 2022-08-29 DIAGNOSIS — I255 Ischemic cardiomyopathy: Secondary | ICD-10-CM

## 2022-08-30 LAB — CUP PACEART REMOTE DEVICE CHECK
Battery Remaining Longevity: 30 mo
Battery Voltage: 2.96 V
Brady Statistic RV Percent Paced: 0.12 %
Date Time Interrogation Session: 20240122001603
HighPow Impedance: 56 Ohm
Implantable Lead Connection Status: 753985
Implantable Lead Implant Date: 20151014
Implantable Lead Location: 753860
Implantable Pulse Generator Implant Date: 20151014
Lead Channel Impedance Value: 399 Ohm
Lead Channel Impedance Value: 418 Ohm
Lead Channel Pacing Threshold Amplitude: 0.625 V
Lead Channel Pacing Threshold Pulse Width: 0.4 ms
Lead Channel Sensing Intrinsic Amplitude: 4.375 mV
Lead Channel Sensing Intrinsic Amplitude: 4.375 mV
Lead Channel Setting Pacing Amplitude: 2 V
Lead Channel Setting Pacing Pulse Width: 0.4 ms
Lead Channel Setting Sensing Sensitivity: 0.3 mV
Zone Setting Status: 755011

## 2022-09-01 ENCOUNTER — Ambulatory Visit: Payer: Medicare HMO | Admitting: Family Medicine

## 2022-09-02 ENCOUNTER — Ambulatory Visit (INDEPENDENT_AMBULATORY_CARE_PROVIDER_SITE_OTHER): Payer: Medicare HMO | Admitting: Family Medicine

## 2022-09-02 ENCOUNTER — Encounter: Payer: Self-pay | Admitting: Family Medicine

## 2022-09-02 VITALS — BP 126/76 | Temp 95.0°F | Ht 67.0 in | Wt 183.0 lb

## 2022-09-02 DIAGNOSIS — J449 Chronic obstructive pulmonary disease, unspecified: Secondary | ICD-10-CM

## 2022-09-02 DIAGNOSIS — I4891 Unspecified atrial fibrillation: Secondary | ICD-10-CM | POA: Diagnosis not present

## 2022-09-02 DIAGNOSIS — I6523 Occlusion and stenosis of bilateral carotid arteries: Secondary | ICD-10-CM | POA: Diagnosis not present

## 2022-09-02 DIAGNOSIS — I5020 Unspecified systolic (congestive) heart failure: Secondary | ICD-10-CM

## 2022-09-02 DIAGNOSIS — Z8679 Personal history of other diseases of the circulatory system: Secondary | ICD-10-CM | POA: Diagnosis not present

## 2022-09-02 NOTE — Progress Notes (Signed)
Subjective:    Patient ID: Anthony Wolfe, male    DOB: 24-Nov-1945, 77 y.o.   MRN: [id number]  Patient is a very pleasant 77 year old Caucasian gentleman with a history of coronary artery disease status postmyocardial infarction, congestive heart failure with reduced ejection fraction, paroxysmal atrial fibrillation, history of bilateral carotid artery stenosis, and CML.  His last carotid Doppler was checked in March 2023 which showed 50 to 69% stenosis.  He is due to recheck that this year in March.  He is asking me to schedule this for him.  He has a history of paroxysmal atrial fibrillation and due to dizziness, he independently decided to stop his Eliquis.  He is not currently taking the blood thinner but every other day.  I explained the patient has a CHA2DS2-VASc 2 score of 7 which puts him at 15% annual stroke risk.  Strongly recommended taking the Eliquis as prescribed.  Explained that the dizziness is likely due to hypotension but he is currently on losartan and metoprolol for his coronary artery disease and congestive heart failure.  I begged him to be compliant with his medication.  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 difficile colitis 06/16/2014   CAD (coronary artery disease)    a. s/p 3v CABG 2010. b. NSTEMI 05/2014 in setting of VT. Cath impressions: "Recent IMI with occluded SVG to RCA. Has Right to Right collaterals and collaterals from septal and OM. EF 40%."   Carotid disease, bilateral ([place])    a. Right CEA 2002; known occluded left carotid. b. Duplex 06/2014: known occluded LICA, stable 6-28% RICA s/p CEA.   Chronic systolic CHF (congestive heart failure) (HCC)    CML (chronic myelocytic  leukemia) ([place]) 05/09/2012   Depression    Emphysema of lung (HCC)    HTN (hypertension)    Hyperlipidemia    Ischemic cardiomyopathy    a. Prior EF 36%. b. 2014: 50%. c. 05/2014: EF 35-40% by echo, 40% with inf HK by cath.   NSTEMI (non-ST elevation myocardial infarction) ([place]) 05/2014   Pleural effusion 05/2014   Pneumonia 05/23/2014   Presence of permanent cardiac pacemaker    Prostate hypertrophy    PVC's (premature ventricular contractions)    a. PVC's with prior Holter showing PVC load of 22%.   Shortness of breath dyspnea    Stroke ([place]) 01   TIA (transient ischemic attack)    ASPVD, S./P. right CEA, 2002, and known occluded left carotid   Ventricular tachycardia ([place])    a. Admitted with VT 05/2013 - EPS with inducible sustained monomorphic VT; s/p [employer] ICD 05/21/14.   Past Surgical History:  Procedure Laterality Date   BACK SURGERY     CARDIOVERSION N/A 01/04/2022   Procedure: CARDIOVERSION;  Surgeon: Elouise Munroe, MD;  Location: [place];  Service: Cardiovascular;  Laterality: N/A;   CAROTID ENDARTERECTOMY Right 01   carotidectomy  2003   right side   COLONOSCOPY  07/2010   CORONARY ARTERY BYPASS GRAFT  02/2009   3 vessel   ELECTROPHYSIOLOGIC STUDY  05/21/14   ELECTROPHYSIOLOGY STUDY N/A 05/21/2014   Procedure: ELECTROPHYSIOLOGY STUDY;  Surgeon: Deboraha Sprang, MD;  Location[facility] LAB;  Service:  Cardiovascular;  Laterality: N/A;   EP IMPLANTABLE DEVICE  05/21/14   single chamber Metronic ICD   IR THORACENTESIS ASP PLEURAL SPACE W/IMG GUIDE  12/07/2021   LEFT HEART CATHETERIZATION WITH CORONARY/GRAFT ANGIOGRAM N/A 05/19/2014   Procedure: LEFT HEART CATHETERIZATION WITH Beatrix Fetters;  Surgeon: Josue Hector, MD;  Location[facility];  Service: Cardiovascular;  Laterality: N/A;   PLEURAL BIOPSY N/A 11/27/2014   Procedure: PLEURAL BIOPSY;  Surgeon: [name], MD;  Location: [place];  Service: Thoracic;  Laterality: N/A;   PLEURAL  EFFUSION DRAINAGE Right 11/27/2014   Procedure: DRAINAGE OF PLEURAL EFFUSION;  Surgeon: [name], MD;  Location: [place];  Service: Thoracic;  Laterality: Right;   TALC PLEURODESIS Right 11/27/2014   Procedure: Pietro Cassis;  Surgeon: [name], MD;  Location: [place];  Service: Thoracic;  Laterality: Right;   VIDEO ASSISTED THORACOSCOPY Right 11/27/2014   Procedure: RIGHT VIDEO ASSISTED THORACOSCOPY;  Surgeon: [name], MD;  Location: [place];  Service: Thoracic;  Laterality: Right;   Current Outpatient Medications on File Prior to Visit  Medication Sig Dispense Refill   albuterol (VENTOLIN HFA) 108 (90 Base) MCG/ACT inhaler INHALE 2 PUFFS BY MOUTH EVERY 6 HOURS AS NEEDED FOR WHEEZING FOR SHORTNESS OF BREATH 18 g 11   ALPRAZolam (XANAX) 0.5 MG tablet Take 1 tablet (0.5 mg total) by mouth 3 (three) times daily as needed for anxiety or sleep. 30 tablet 0   apixaban (ELIQUIS) 5 MG TABS tablet Take 1 tablet (5 mg total) by mouth 2 (two) times daily. 60 tablet 6   atorvastatin (LIPITOR) 80 MG tablet TAKE 1 TABLET BY MOUTH ONCE DAILY AT  6  PM 90 tablet 3   carvedilol (COREG) 25 MG tablet Take 1 tablet (25 mg total) by mouth 2 (two) times daily. 180 tablet 3   clopidogrel (PLAVIX) 75 MG tablet Take 1 tablet by mouth once daily 90 tablet 3   diphenoxylate-atropine (LOMOTIL) 2.5-0.025 MG tablet Take 1 tablet by mouth 4 (four) times daily as needed for diarrhea or loose stools. 30 tablet 0   empagliflozin (JARDIANCE) 10 MG TABS tablet Take 1 tablet (10 mg total) by mouth daily before breakfast. 30 tablet 11   ezetimibe (ZETIA) 10 MG tablet Take 1 tablet (10 mg total) by mouth daily. 90 tablet 3   furosemide (LASIX) 20 MG tablet Take 1 tablet (20 mg total) by mouth as needed for fluid or edema. (Patient taking differently: Take 40 mg by mouth daily.) 90 tablet 1   losartan (COZAAR) 25 MG tablet Take 1 tablet (25 mg total) by mouth at bedtime. 90 tablet 3   meclizine  (ANTIVERT) 25 MG tablet Take 1 tablet (25 mg total) by mouth 3 (three) times daily as needed for dizziness. 30 tablet 0   Naphazoline HCl (CLEAR EYES OP) Place 1 drop into both eyes daily as needed (Runny eyes).     nilotinib (TASIGNA) 50 MG capsule Take 2 capsules (200 mg total) by mouth every 12 hours 120 capsule 3   sildenafil (VIAGRA) 100 MG tablet Take 0.5-1 tablets (50-100 mg total) by mouth daily as needed for erectile dysfunction. 5 tablet 11   Tiotropium Bromide-Olodaterol (STIOLTO RESPIMAT) 2.5-2.5 MCG/ACT AERS Inhale 2 puffs into the lungs every morning.     Xylometazoline HCl (4-WAY NASAL SPRAY NA) Place 1 spray into both nostrils daily as needed (Congestion).     No current facility-administered medications on file prior to visit.   Allergies  Allergen Reactions   Breztri Aerosphere [Budeson-Glycopyrrol-Formoterol] Swelling    Swelling of mouth tongue    Norvasc [Amlodipine Besylate] Rash    rash   Sulfa Antibiotics Hives    Hives    Social History   Socioeconomic History   Marital status: Married    Spouse name: Not on file   Number of children: 2   Years of education: Not on file   Highest education level: Not on file  Occupational History    Comment: retired Development worker, international aid  Tobacco Use   Smoking status: Former    Packs/day: 3.00    Years: 45.00    Total pack years: 135.00    Types: Cigarettes    Quit date: 08/08/1997    Years since quitting: 25.0    Passive exposure: Past   Smokeless tobacco: Former  Scientific laboratory technician Use: Never used  Substance and Sexual Activity   Alcohol use: No    Alcohol/week: 0.0 standard drinks of alcohol   Drug use: No   Sexual activity: Yes  Other Topics Concern   Not on file  Social History Narrative   Not on file   Social Determinants of Health   Financial Resource Strain: Low Risk  (11/11/2021)   Overall Financial Resource Strain (CARDIA)    Difficulty of Paying Living Expenses: Not hard at all  Food Insecurity: No Food  Insecurity (11/11/2021)   Hunger Vital Sign    Worried About Running Out of Food in the Last Year: Never true    [place] in the Last Year: Never true  Transportation Needs: No Transportation Needs (11/11/2021)   PRAPARE - Hydrologist (Medical): No    Lack of Transportation (Non-Medical): No  Physical Activity: Insufficiently Active (11/11/2021)   Exercise Vital Sign    Days of Exercise per Week: 3 days    Minutes of Exercise per Session: 30 min  Stress: No Stress Concern Present (11/11/2021)   [place]    Feeling of Stress : Not at all  Social Connections: [place] (11/11/2021)   Social Connection and Isolation Panel [NHANES]    Frequency of Communication with Friends and Family: More than three times a week    Frequency of Social Gatherings with Friends and Family: More than three times a week    Attends Religious Services: More than 4 times per year    Active Member of [employer] or Organizations: Yes    Attends Archivist Meetings: More than 4 times per year    Marital Status: Married  Human resources officer Violence: Not At Risk (11/11/2021)   Humiliation, Afraid, Rape, and Kick questionnaire    Fear of Current or Ex-Partner: No    Emotionally Abused: No    Physically Abused: No    Sexually Abused: No      Review of Systems  Gastrointestinal:  Positive for diarrhea.  All other systems reviewed and are negative.      Objective:   Physical Exam Vitals reviewed.  Constitutional:      General: He is not in acute distress.    Appearance: Normal appearance. He is well-developed. He is not ill-appearing, toxic-appearing or diaphoretic.  Cardiovascular:     Rate and Rhythm: Normal rate. Rhythm irregularly irregular.     Heart sounds: Normal heart sounds. No murmur heard. Pulmonary:     Effort: Pulmonary effort is normal. No tachypnea, accessory muscle usage, prolonged  expiration  or respiratory distress.     Breath sounds: No wheezing or rales.  Abdominal:     General: Bowel sounds are normal. There is no distension.     Palpations: Abdomen is soft. There is no mass.     Tenderness: There is no abdominal tenderness. There is no guarding or rebound.  Musculoskeletal:     Right lower leg: No edema.     Left lower leg: No edema.  Neurological:     Mental Status: He is alert.           Assessment & Plan:  Heart failure with reduced ejection fraction (HCC) - Plan: Lipid panel, CANCELED: COMPLETE METABOLIC PANEL WITH GFR, CANCELED: CBC with Differential/Platelet  Atrial fibrillation, unspecified type ([place]) - Plan: Lipid panel, CANCELED: COMPLETE METABOLIC PANEL WITH GFR, CANCELED: CBC with Differential/Platelet  History of ASCVD - Plan: Lipid panel, CANCELED: COMPLETE METABOLIC PANEL WITH GFR, CANCELED: CBC with Differential/Platelet  Chronic obstructive pulmonary disease, unspecified COPD type ([place]) - Plan: Lipid panel, CANCELED: COMPLETE METABOLIC PANEL WITH GFR, CANCELED: CBC with Differential/Platelet  Bilateral carotid artery stenosis - Plan: US Carotid Duplex Bilateral Patient has a history of atrial fibrillation.  Strongly encouraged the patient to be compliant with Eliquis due to his 15% annual stroke risk.  Recommend he take the 5 mg twice daily.  Blood pressure today is acceptable.  He does occasionally get dizziness secondary to dehydration and hypotension.  However I explained the rationale of taking the metoprolol and losartan to reduce his morbidity and mortality risk.  Patient states that he will resume the medication.  I gave him samples of Stiolto 2 inhalations daily to see if this will help with his shortness of breath with activity.  I will check a lipid panel.  Goal LDL cholesterol is less than 55.  Just had CBC and CMP drawn 2 weeks ago that was within normal limits.  Schedule the patient for carotid artery ultrasound to monitor his  carotid artery stenosis

## 2022-09-15 ENCOUNTER — Encounter (HOSPITAL_COMMUNITY): Payer: Self-pay | Admitting: *Deleted

## 2022-09-19 ENCOUNTER — Ambulatory Visit
Admission: RE | Admit: 2022-09-19 | Discharge: 2022-09-19 | Disposition: A | Discharge status: Home / Self Care | Payer: Medicare HMO | Source: Ambulatory Visit | Attending: Family Medicine | Admitting: Family Medicine

## 2022-09-19 DIAGNOSIS — I10 Essential (primary) hypertension: Secondary | ICD-10-CM | POA: Diagnosis not present

## 2022-09-19 DIAGNOSIS — E785 Hyperlipidemia, unspecified: Secondary | ICD-10-CM | POA: Diagnosis not present

## 2022-09-19 DIAGNOSIS — I6523 Occlusion and stenosis of bilateral carotid arteries: Secondary | ICD-10-CM

## 2022-09-19 DIAGNOSIS — Z9889 Other specified postprocedural states: Secondary | ICD-10-CM | POA: Diagnosis not present

## 2022-09-30 ENCOUNTER — Other Ambulatory Visit: Payer: Self-pay | Admitting: Family Medicine

## 2022-09-30 ENCOUNTER — Telehealth: Payer: Self-pay

## 2022-09-30 DIAGNOSIS — J449 Chronic obstructive pulmonary disease, unspecified: Secondary | ICD-10-CM

## 2022-09-30 NOTE — Telephone Encounter (Signed)
Pt called requesting a referral to Pulmonology. Pt states he is having issues with his breathing. Offered appointment to see you but pt states he feels like he just needs to go Pulmonology. Thanks.

## 2022-10-13 ENCOUNTER — Other Ambulatory Visit: Payer: Self-pay | Admitting: Family Medicine

## 2022-10-13 ENCOUNTER — Institutional Professional Consult (permissible substitution): Payer: Medicare HMO | Admitting: Internal Medicine

## 2022-10-15 NOTE — Progress Notes (Signed)
Remote ICD transmission.   

## 2022-10-18 ENCOUNTER — Other Ambulatory Visit: Payer: Self-pay | Admitting: Family Medicine

## 2022-10-18 ENCOUNTER — Other Ambulatory Visit: Payer: Self-pay | Admitting: Internal Medicine

## 2022-10-26 ENCOUNTER — Encounter: Payer: Self-pay | Admitting: Internal Medicine

## 2022-10-26 ENCOUNTER — Ambulatory Visit: Payer: Medicare HMO | Admitting: Internal Medicine

## 2022-10-26 ENCOUNTER — Telehealth: Payer: Self-pay

## 2022-10-26 ENCOUNTER — Telehealth: Payer: Self-pay | Admitting: Internal Medicine

## 2022-10-26 ENCOUNTER — Ambulatory Visit (INDEPENDENT_AMBULATORY_CARE_PROVIDER_SITE_OTHER): Payer: Medicare HMO

## 2022-10-26 VITALS — BP 136/82 | HR 91 | Temp 98.0°F | Ht 69.0 in | Wt 186.4 lb

## 2022-10-26 DIAGNOSIS — R0609 Other forms of dyspnea: Secondary | ICD-10-CM

## 2022-10-26 DIAGNOSIS — R918 Other nonspecific abnormal finding of lung field: Secondary | ICD-10-CM | POA: Diagnosis not present

## 2022-10-26 LAB — HEPATIC FUNCTION PANEL
ALT: 12 U/L (ref 0–53)
AST: 12 U/L (ref 0–37)
Albumin: 3.7 g/dL (ref 3.5–5.2)
Alkaline Phosphatase: 91 U/L (ref 39–117)
Bilirubin, Direct: 0.1 mg/dL (ref 0.0–0.3)
Total Bilirubin: 0.5 mg/dL (ref 0.2–1.2)
Total Protein: 6.9 g/dL (ref 6.0–8.3)

## 2022-10-26 LAB — CBC WITH DIFFERENTIAL/PLATELET
Basophils Absolute: 0.1 10*3/uL (ref 0.0–0.1)
Basophils Relative: 1.1 % (ref 0.0–3.0)
Eosinophils Absolute: 0.1 10*3/uL (ref 0.0–0.7)
Eosinophils Relative: 2.1 % (ref 0.0–5.0)
HCT: 37.8 % — ABNORMAL LOW (ref 39.0–52.0)
Hemoglobin: 12.4 g/dL — ABNORMAL LOW (ref 13.0–17.0)
Lymphocytes Relative: 17.5 % (ref 12.0–46.0)
Lymphs Abs: 1.1 10*3/uL (ref 0.7–4.0)
MCHC: 32.7 g/dL (ref 30.0–36.0)
MCV: 93.1 fl (ref 78.0–100.0)
Monocytes Absolute: 0.5 10*3/uL (ref 0.1–1.0)
Monocytes Relative: 7.9 % (ref 3.0–12.0)
Neutro Abs: 4.5 10*3/uL (ref 1.4–7.7)
Neutrophils Relative %: 71.4 % (ref 43.0–77.0)
Platelets: 265 10*3/uL (ref 150.0–400.0)
RBC: 4.06 Mil/uL — ABNORMAL LOW (ref 4.22–5.81)
RDW: 15.2 % (ref 11.5–15.5)
WBC: 6.3 10*3/uL (ref 4.0–10.5)

## 2022-10-26 LAB — SEDIMENTATION RATE: Sed Rate: 3 mm/hr (ref 0–20)

## 2022-10-26 LAB — BASIC METABOLIC PANEL
BUN: 15 mg/dL (ref 6–23)
CO2: 27 mEq/L (ref 19–32)
Calcium: 9.1 mg/dL (ref 8.4–10.5)
Chloride: 108 mEq/L (ref 96–112)
Creatinine, Ser: 1.02 mg/dL (ref 0.40–1.50)
GFR: 70.27 mL/min (ref >60.00)
Glucose, Bld: 92 mg/dL (ref 70–99)
Potassium: 4.2 mEq/L (ref 3.5–5.1)
Sodium: 142 mEq/L (ref 135–145)

## 2022-10-26 LAB — TSH: TSH: 4.13 u[IU]/mL (ref 0.35–5.50)

## 2022-10-26 LAB — BRAIN NATRIURETIC PEPTIDE: Pro B Natriuretic peptide (BNP): 616 pg/mL — ABNORMAL HIGH (ref 0.0–100.0)

## 2022-10-26 NOTE — Telephone Encounter (Signed)
Called patient.  Gave result information per Dr. Melvyn Novas and to make sure patient has follow up with cardiologist.  Patient verbalized understanding.  He does not know if he has a follow up with cardiology.  Patient requesting we call [place], and request Dr. Ali Lowe review lab results and contact patient regarding his recommendations and if he needs an appointment.  Called  [facility].  They will notify Dr. Ali Lowe regarding review of test results and contact patient -- will contact Dr. Melvyn Novas if any questions.

## 2022-10-26 NOTE — Assessment & Plan Note (Addendum)
Onset w/in 3-4 months of starting Tasigna d/c'd end of Feb 2024 - Echo 10/27/21  1. Left ventricular ejection fraction,  40 to 45%.   2. Right ventricular systolic function is low normal. The right  ventricular size is normal.   3. Left atrial size was moderately dilated.   4. The mitral valve is grossly normal. Mild mitral valve regurgitation.   5. The aortic valve is tricuspid. Aortic valve regurgitation is not  visualized.   6. The pulmonic valve was abnormal.   7. Aortic dilatation noted. There is borderline dilatation of the aortic  root, measuring 38 mm.   8. The inferior vena cava is normal in size with greater than 50%  respiratory variability, suggesting right atrial pressure of 3 mmHg.   -  10/26/2022   Walked on RA  x  3   lap(s) =  approx 750   ft  @ mod pace, stopped due to end of study with lowest 02 sats 98%  noted mild doe p last lap   Symptoms are markedly disproportionate to objective findings and not clear to what extent this is actually a pulmonary  problem but pt does appear to have difficult to sort out respiratory symptoms of unknown origin for which  DDX  = almost all start with A and  include Adherence, Ace Inhibitors, Acid Reflux, Active Sinus Disease, Alpha 1 Antitripsin deficiency, Anxiety masquerading as Airways dz,  ABPA,  Allergy(esp in young), Aspiration (esp in elderly), Adverse effects of meds,  Active smoking or Vaping, A bunch of PE's/clot burden (a few small clots can't cause this syndrome unless there is already severe underlying pulm or vascular dz with poor reserve),  Anemia or thyroid disorder, plus two Bs  = Bronchiectasis and Beta blocker use..and one C= CHF    Of the highlighted dx's, the most likely are CHF, Beta blocker effects advserse effects of meds or anxiety/ depression/ deconditioning with the latter being dx of exclusion.  As regards smoking hx When respiratory symptoms begin or become refractory well after a patient reports complete smoking  cessation,  Especially when this wasn't the case while they were smoking, a red flag is raised based on the work of Dr Kris Mouton which states:  if you quit smoking when your best day FEV1 is still well preserved it is highly unlikely you will progress to severe disease.  That is to say, once the smoking stops,  the symptoms should not suddenly erupt or markedly worsen.  If so, the differential diagnosis should include  obesity/deconditioning,  LPR/Reflux/Aspiration syndromes,  occult CHF, or  especially side effect of medications commonly used in this population.    I don't see evidence of a toxic drug reaction but tasigna can cause fluid retention and thereby exacerbate known chf so seems reasonable to hold for few doses then rechallenge later if baseline improves eg with rx for occult chf.   F/u in 6 weeks and in meantime advised Make sure you check your oxygen saturation at your highest level of activity(NOT after you stop)  to be sure it stays over 90% and keep track of it at least once a week, more often if breathing getting worse, and let me know if losing ground. (Collect the dots to connect the dots approach)     Each maintenance medication was reviewed in detail including emphasizing most importantly the difference between maintenance and prns and under what circumstances the prns are to be triggered using an action plan format where appropriate.  Total time for H and P, chart review, counseling,  directly observing portions of ambulatory 02 saturation study/ and generating customized AVS unique to this office visit / same day charting > 60 min for new pt eval with  refractory respiratory  symptoms of uncertain etiology

## 2022-10-26 NOTE — Telephone Encounter (Signed)
Pt c/o Shortness Of Breath: STAT if SOB developed within the last 24 hours or pt is noticeably SOB on the phone  1. Are you currently SOB (can you hear that pt is SOB on the phone)?   2. How long have you been experiencing SOB?  Approximately 2 years, unsure of how long as of recently  3. Are you SOB when sitting or when up moving around?  On exertion  4. Are you currently experiencing any other symptoms?   No longer with patient - Amy of [facility] called in on behalf on the patient. He saw Dr. Melvyn Novas today for DOE. Per Amy, SOB has been ongoing for the past 2 years. Dr. Melvyn Novas took labs today and based on results, assumes symptoms may be cardiac related. would like to have Dr. Ali Lowe review. Patient walked 3 laps today just fine. No SOB notes. 104 HR by the end of lap 3. 84 resting HR prior to walking. o2 remained at 100, 98 lowest.  Patient would like a call back with advisement. If questions, a call may be return to Amy this afternoon at [phone]. If tomorrow, please contact their main line, [phone].

## 2022-10-26 NOTE — Patient Instructions (Signed)
We will walk you today to check your 0xygen levels with exertion   Make sure you check your oxygen saturation at your highest level of activity(NOT after you stop)  to be sure it stays over 90% and keep track of it at least once a week, more often if breathing getting worse, and let me know if losing ground. (Collect the dots to connect the dots approach)    Please remember to go to the lab and x-ray department  for your tests - we will call you with the results when they are available.      Please schedule a follow up office visit in 6 weeks, call sooner if needed

## 2022-10-26 NOTE — Telephone Encounter (Signed)
I spoke with patient today and reviewed his labs.  I have advised him to start lasix 20mg  BID (up from PRN).  We will schedule him to be seen in our office for an evaluation with an EKG.  If recurrent AF is seen on EKG, will consider repeat cardioversion.  Patient understands and agrees with plan.

## 2022-10-26 NOTE — Progress Notes (Signed)
Anthony Wolfe, male    DOB: Aug 14, 1945   MRN: [id number]   Brief patient profile:  68  yowm quit smoking in 1999 s apparent sequelae   referred to pulmonary clinic 10/26/2022 by Dr [name]  for sob   Recurrent bilaeral effusions while Bosulif s/p Hendrickson pleurodesis then just Left effusions until stopped Bosulif and did fine x 6 month. Then started Tasigna  around 2020   and 3-4 months later noted doe or bend over   12/07/21  L side t centesis x 850 cc and stopped Tasigna around end of Feb 2024 and no change in doe since     History of Present Illness  10/26/2022  Pulmonary/ 1st office eval/[name]  Chief Complaint  Patient presents with   Consult    SOB  and dyspnea x 2 years.  Dx leukemia - Tasigna may be cause of fluid in lungs.  Dyspnea:  walk 10 min and stops, slow pace flat surface - does not check 02  Cough: none  Sleep: bed is level  SABA use: alb bid does not help   No obvious day to day or daytime pattern/variability or assoc excess/ purulent sputum or mucus plugs or hemoptysis or cp or chest tightness, subjective wheeze or overt sinus or hb symptoms.   Sleeping  without nocturnal  or early am exacerbation  of respiratory  c/o's or need for noct saba. Also denies any obvious fluctuation of symptoms with weather or environmental changes or other aggravating or alleviating factors except as outlined above   No unusual exposure hx or h/o childhood pna/ asthma or knowledge of premature birth.  Current Allergies, Complete Past Medical History, Past Surgical History, Family History, and Social History were reviewed in [employer] record.  ROS  The following are not active complaints unless bolded Hoarseness, sore throat, dysphagia, dental problems, itching, sneezing,  nasal congestion or discharge of excess mucus or purulent secretions, ear ache,   fever, chills, sweats, unintended wt loss or wt gain, classically pleuritic or exertional cp,   orthopnea pnd or arm/hand swelling  or leg swelling, presyncope, palpitations, abdominal pain, anorexia, nausea, vomiting, diarrhea  or change in bowel habits or change in bladder habits, change in stools or change in urine, dysuria, hematuria,  rash, arthralgias, visual complaints, headache, numbness, weakness or ataxia or problems with walking or coordination,  change in mood or  memory.           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 difficile colitis 06/16/2014   CAD (coronary artery disease)    a. s/p 3v CABG 2010. b. NSTEMI 05/2014 in setting of VT. Cath impressions: "Recent IMI with occluded SVG to RCA. Has Right to Right collaterals and collaterals from septal and OM. EF 40%."   Carotid disease, bilateral ([place])    a. Right CEA 2002; known occluded left carotid. b. Duplex 06/2014: known occluded LICA, stable [id number] RICA s/p CEA.   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 Prior EF 36%. b. 2014: 50%. c. 05/2014: EF 35-40% by echo, 40% with inf HK  by cath.   NSTEMI (non-ST elevation myocardial infarction) ([place]) 05/2014   Pleural effusion 05/2014   Pneumonia 05/23/2014   Presence of permanent cardiac pacemaker    Prostate hypertrophy    PVC's (premature ventricular contractions)    a. PVC's with prior Holter showing PVC load of 22%.   Shortness of breath dyspnea    Stroke ([place]) 01   TIA (transient ischemic attack)    ASPVD, S./P. right CEA, 2002, and known occluded left carotid   Ventricular tachycardia ([place])    a. Admitted with VT 05/2013 - EPS with inducible sustained monomorphic VT; s/p [employer] ICD 05/21/14.    Outpatient Medications  Prior to Visit  Medication Sig Dispense Refill   albuterol (VENTOLIN HFA) 108 (90 Base) MCG/ACT inhaler INHALE 2 PUFFS BY MOUTH EVERY 6 HOURS AS NEEDED FOR WHEEZING FOR SHORTNESS OF BREATH 18 g 11   apixaban (ELIQUIS) 5 MG TABS tablet Take 1 tablet (5 mg total) by mouth 2 (two) times daily. 60 tablet 6   atorvastatin (LIPITOR) 80 MG tablet TAKE 1 TABLET BY MOUTH ONCE DAILY AT  6PM 90 tablet 0   carvedilol (COREG) 25 MG tablet Take 1 tablet (25 mg total) by mouth 2 (two) times daily. Please call [phone] to schedule an appointment for June for future refills. Thank you. 180 tablet 0   clopidogrel (PLAVIX) 75 MG tablet Take 1 tablet by mouth once daily 90 tablet 0   diphenoxylate-atropine (LOMOTIL) 2.5-0.025 MG tablet Take 1 tablet by mouth 4 (four) times daily as needed for diarrhea or loose stools. 30 tablet 0   empagliflozin (JARDIANCE) 10 MG TABS tablet Take 1 tablet (10 mg total) by mouth daily before breakfast. 30 tablet 11   ezetimibe (ZETIA) 10 MG tablet Take 1 tablet (10 mg total) by mouth daily. 90 tablet 3   furosemide (LASIX) 20 MG tablet Take 1 tablet (20 mg total) by mouth as needed for fluid or edema. (Patient taking differently: Take 20 mg by mouth daily as needed for fluid or edema.) 90 tablet 1   losartan (COZAAR) 25 MG tablet Take 1 tablet (25 mg total) by mouth at bedtime. 90 tablet 3   meclizine (ANTIVERT) 25 MG tablet Take 1 tablet (25 mg total) by mouth 3 (three) times daily as needed for dizziness. 30 tablet 0   mexiletine (MEXITIL) 200 MG capsule Take 200 mg by mouth 3 (three) times daily.     Naphazoline HCl (CLEAR EYES OP) Place 1 drop into both eyes daily as needed (Runny eyes).     nilotinib (TASIGNA) 50 MG capsule Take 2 capsules (200 mg total) by mouth every 12 hours 120 capsule 3   nitroGLYCERIN (NITROSTAT) 0.4 MG SL tablet Place 0.4 mg under the tongue every 5 (five) minutes as needed for chest pain.            valsartan (DIOVAN) 160 MG tablet Take 160 mg by mouth  daily.     Xylometazoline HCl (4-WAY NASAL SPRAY NA) Place 1 spray into both nostrils daily as needed (Congestion).     ALPRAZolam (XANAX) 0.5 MG tablet Take 1 tablet (0.5 mg total) by mouth 3 (three) times daily as needed for anxiety or sleep. (Patient not taking: Reported on 09/02/2022) 30 tablet 0   sildenafil (VIAGRA) 100 MG tablet Take 0.5-1 tablets (50-100 mg total) by mouth daily as needed for erectile dysfunction. (Patient not taking: Reported on 10/26/2022) 5 tablet 11   No facility-administered medications prior to visit.  Objective:     BP 136/82 (BP Location: Left Arm, Patient Position: Sitting, Cuff Size: Normal)   Pulse 91   Temp 98 F (36.7 C) (Oral)   Ht 5\' 9"  (1.753 m)   Wt 186 lb 6.4 oz (84.6 kg)   SpO2 97%   BMI 27.53 kg/m   SpO2: 97 %  Amb pleasant wm nad    HEENT : Oropharynx  clear/ edentulous         NECK :  without  apparent JVD/ palpable Nodes/TM    LUNGS: no acc muscle use,  Nl contour chest which is clear to A and P bilaterally without cough on insp or exp maneuvers   CV:  IRIR   no s3 or murmur or increase in P2, and no edema   ABD:  soft and nontender with nl inspiratory excursion in the supine position. No bruits or organomegaly appreciated   MS:  Nl gait/ ext warm without deformities Or obvious joint restrictions  calf tenderness, cyanosis or clubbing    SKIN: warm and dry without lesions    NEURO:  alert, approp, nl sensorium with  no motor or cerebellar deficits apparent.    CXR PA and Lateral:   10/26/2022 :    I personally reviewed images and impression is as follows:     No effusions/ R pleural scar, no ILD    Labs ordered/ reviewed:      Chemistry      Component Value Date/Time   NA 142 10/26/2022 0955   NA 143 02/03/2022 0903   NA 142 06/08/2017 0832   K 4.2 10/26/2022 0955   K 3.9 06/08/2017 0832   CL 108 10/26/2022 0955   CL 110 (H) 11/14/2012 0918   CO2 27 10/26/2022 0955   CO2 25 06/08/2017 0832   BUN 15  10/26/2022 0955   BUN 10 02/03/2022 0903   BUN 11.6 06/08/2017 0832   CREATININE 1.02 10/26/2022 0955   CREATININE 1.13 01/22/2021 0840   CREATININE 0.90 05/31/2019 0837   CREATININE 0.9 06/08/2017 0832      Component Value Date/Time   CALCIUM 9.1 10/26/2022 0955   CALCIUM 8.6 06/08/2017 0832   ALKPHOS 91 10/26/2022 0955   ALKPHOS 96 06/08/2017 0832   AST 12 10/26/2022 0955   AST 13 (L) 01/22/2021 0840   AST 13 06/08/2017 0832   ALT 12 10/26/2022 0955   ALT 14 01/22/2021 0840   ALT 7 06/08/2017 0832   BILITOT 0.5 10/26/2022 0955   BILITOT 0.7 02/03/2022 0903   BILITOT 0.3 01/22/2021 0840   BILITOT 0.44 06/08/2017 0832        Lab Results  Component Value Date   WBC 6.3 10/26/2022   HGB 12.4 (L) 10/26/2022   HCT 37.8 (L) 10/26/2022   MCV 93.1 10/26/2022   PLT 265.0 10/26/2022       Lab Results  Component Value Date   TSH 4.13 10/26/2022     Lab Results  Component Value Date   PROBNP 616.0 (H) 10/26/2022       Lab Results  Component Value Date   ESRSEDRATE 3 10/26/2022   ESRSEDRATE 34 (H) 02/24/2022            Assessment   DOE (dyspnea on exertion) Onset w/in 3-4 months of starting Tasigna d/c'd end of Feb 2024 - Echo 10/27/21  1. Left ventricular ejection fraction,  40 to 45%.   2. Right ventricular systolic function is low normal. The right  ventricular size  is normal.   3. Left atrial size was moderately dilated.   4. The mitral valve is grossly normal. Mild mitral valve regurgitation.   5. The aortic valve is tricuspid. Aortic valve regurgitation is not  visualized.   6. The pulmonic valve was abnormal.   7. Aortic dilatation noted. There is borderline dilatation of the aortic  root, measuring 38 mm.   8. The inferior vena cava is normal in size with greater than 50%  respiratory variability, suggesting right atrial pressure of 3 mmHg.   -  10/26/2022   Walked on RA  x  3   lap(s) =  approx 750   ft  @ mod pace, stopped due to end of study  with lowest 02 sats 98%  noted mild doe p last lap   Symptoms are markedly disproportionate to objective findings and not clear to what extent this is actually a pulmonary  problem but pt does appear to have difficult to sort out respiratory symptoms of unknown origin for which  DDX  = almost all start with A and  include Adherence, Ace Inhibitors, Acid Reflux, Active Sinus Disease, Alpha 1 Antitripsin deficiency, Anxiety masquerading as Airways dz,  ABPA,  Allergy(esp in young), Aspiration (esp in elderly), Adverse effects of meds,  Active smoking or Vaping, A bunch of PE's/clot burden (a few small clots can't cause this syndrome unless there is already severe underlying pulm or vascular dz with poor reserve),  Anemia or thyroid disorder, plus two Bs  = Bronchiectasis and Beta blocker use..and one C= CHF    Of the highlighted dx's, the most likely are CHF, Beta blocker effects advserse effects of meds or anxiety/ depression/ deconditioning with the latter being dx of exclusion.  As regards smoking hx When respiratory symptoms begin or become refractory well after a patient reports complete smoking cessation,  Especially when this wasn't the case while they were smoking, a red flag is raised based on the work of Dr Kris Mouton which states:  if you quit smoking when your best day FEV1 is still well preserved it is highly unlikely you will progress to severe disease.  That is to say, once the smoking stops,  the symptoms should not suddenly erupt or markedly worsen.  If so, the differential diagnosis should include  obesity/deconditioning,  LPR/Reflux/Aspiration syndromes,  occult CHF, or  especially side effect of medications commonly used in this population.    I don't see evidence of a toxic drug reaction but tasigna can cause fluid retention and thereby exacerbate known chf so seems reasonable to hold for few doses then rechallenge later if baseline improves eg with rx for occult chf.   F/u in 6 weeks  and in meantime advised Make sure you check your oxygen saturation at your highest level of activity(NOT after you stop)  to be sure it stays over 90% and keep track of it at least once a week, more often if breathing getting worse, and let me know if losing ground. (Collect the dots to connect the dots approach)     Each maintenance medication was reviewed in detail including emphasizing most importantly the difference between maintenance and prns and under what circumstances the prns are to be triggered using an action plan format where appropriate.  Total time for H and P, chart review, counseling,  directly observing portions of ambulatory 02 saturation study/ and generating customized AVS unique to this office visit / same day charting > 60 min for new pt eval  with  refractory respiratory  symptoms of uncertain etiology                    Christinia Gully, MD 10/26/2022

## 2022-10-26 NOTE — Progress Notes (Signed)
Called patient.  Gave result information per Dr. Melvyn Novas.  Patient verbalized understanding.  He does not know if he has a follow up with cardiology.  Patient requesting we call [place], and request Dr. Ali Lowe review lab results and contact patient regarding his recommendations and if he needs an appointment.  Called  [facility].  They will notify Dr. Ali Lowe regarding review of test results and contact patient.

## 2022-10-27 ENCOUNTER — Telehealth: Payer: Self-pay

## 2022-10-27 MED ORDER — FUROSEMIDE 20 MG PO TABS: 20.0000 mg | ORAL_TABLET | Freq: Two times a day (BID) | ORAL | 1 refills | Status: DC

## 2022-10-27 NOTE — Addendum Note (Signed)
Addended by: Aris Georgia, Malayjah Otoole L on: 10/27/2022 08:22 AM   Modules accepted: Orders

## 2022-10-27 NOTE — Telephone Encounter (Signed)
Called patient and made him an appointment with Dr. Ali Lowe. Sent in Lasix 20 mg BID to patient's pharmacy. Patient will call back with any other concerns. Patient stated he was on his way to [place] to get something for breakfast. Informed patient that this is not a great breakfast choice, but he might need to have a discussion about his diet with the doctor. Patient verbalized understanding.

## 2022-10-27 NOTE — Telephone Encounter (Signed)
Per patient request, I called Anthony Wolfe information regarding lab work for Dr. Ali Lowe.  This is his response received yesterday:  Anthony Wolfe, MD15 hours ago (6:19 PM)    I spoke with patient today and reviewed his labs.  I have advised him to start lasix 20mg  BID (up from PRN).  We will schedule him to be seen in our office for an evaluation with an EKG.  If recurrent AF is seen on EKG, will consider repeat cardioversion.  Patient understands and agrees with plan.

## 2022-10-29 NOTE — Progress Notes (Unsigned)
Cardiology Office Note:    Date:  11/02/2022   ID:  Anthony Wolfe, DOB 1946-05-13, MRN [id number]  PCP:  [name], MD   [facility] Providers Cardiologist:  Lenna Sciara, MD Referring MD: [name], MD   Chief Complaint/Reason for Referral: Dyspnea  ASSESSMENT:    1. Dyspnea on exertion   2. Atrial fibrillation, unspecified type ([place])   3. Cardiomyopathy, ischemic   4. Coronary artery disease involving native coronary artery of native heart without angina pectoris   5. Hyperlipidemia LDL goal <70   6. Bilateral carotid artery disease, unspecified type ([place])   7. Aortic atherosclerosis ([place])   8. ICD (implantable cardioverter-defibrillator) in place        PLAN:    In order of problems listed above: 1.  Dyspnea: The patient is improved on Lasix 20 mg twice daily.  Will continue this for now.  Will check electrolytes today.  He is euvolemic on exam today.  I will have him see EP for recurrent atrial fibrillation.  I will plan on obtaining an echocardiogram once we achieve normal sinus rhythm if this is possible.  I will see him back in around 3 months. 2.  Atrial fibrillation: Status post cardioversion May 2023 and he is back in atrial fibrillation.  I considered starting amiodarone today however the patient's [place] is associated with QT prolongation which may be exacerbated with amiodarone.  I will have the patient see Dr. Caryl Comes for further recommendations and to discuss the need for ICD generator replacement. 3.  Ischemic cardiomyopathy: Continue valsartan, Jardiance, Lasix, and Coreg.  Looks euvolemic on exam today.  Patient was on losartan and valsartan.  Will discontinue losartan. 4.  Coronary artery disease: Continue beta-blocker and Eliquis.  Will DC Plavix as patient is on Eliquis.  He is having no exertional angina. 5.  Hyperlipidemia: Check lipid panel and LFTs today; goal LDL is less than 70. 6.  Bilateral carotid stenosis: Continue Eliquis, PCP  is monitoring carotid ultrasounds. 7.  Aortic atherosclerosis: Continue Eliquis, statin, and strict blood pressure control. 8.  Status post ICD: The patient tells me that he is nearing the need for a generator change.  Will refer to EP for further recommendations.   Dispo: Follow-up in 3 months     Medication Adjustments/Labs and Tests Ordered: Current medicines are reviewed at length with the patient today.  Concerns regarding medicines are outlined above.  The following changes have been made:     Labs/tests ordered: Orders Placed This Encounter  Procedures   Lipid panel   Comprehensive metabolic panel   EKG [id number]    Medication Changes: No orders of the defined types were placed in this encounter.    Current medicines are reviewed at length with the patient today.  The patient does not have concerns regarding medicines.   History of Present Illness:    FOCUSED PROBLEM LIST:   1.  Coronary artery disease status post CABG consisting of a LIMA to LAD, vein graft to obtuse marginal 1 and 2, and vein graft to RCA in 2010; the vein graft to RCA is known occluded on cardiac catheterization in 2015 2.  Ischemic cardiomyopathy EF 40-45% 3.  VT status post ICD followed by EP 4.  Hypertension 5.  Hyperlipidemia; low LP(a) 6.  Peripheral vascular disease status post right carotid enterectomy with known occluded left internal carotid artery 7.  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  Emphysema 9.  Chronic occlusion of left internal carotid and  50 to 69% stenosis of right internal carotid followed by patient's PCP 10.  Right bundle branch block 11.  Atrial fibrillation CV2 score 4 on Eliquis;  status post cardioversion May 2023 12.  Aortic atherosclerosis on chest CT 2018 13.  History of multiple thoracenteses for pleural effusions  March 2023: Patient seen for initial consultation to reestablish cardiovascular care.  At that appointment losartan was started as well as Jardiance and his  Coreg was increased to 25 mg.  April 2023: Patient was seen for expedited follow-up due to the detection of atrial fibrillation at his PCPs office.  He was started on Eliquis 5 mg twice daily.  His mexiletine was discontinued.  He was referred for cardioversion.  Additionally his losartan was started at 25 mg every afternoon.  May 2023: The patient was seen for expedited follow-up due to worsening shortness of breath.  He was sent to the emergency department due to suspected pleural effusion.  He was admitted and underwent thoracentesis.  Around 850 cc of straw-colored fluid was evacuated.  Cytology was negative.  Laboratories were consistent with an exudative effusion.  June 2023: In the interim he underwent cardioversion at the end of May to normal sinus rhythm.  He had a nuisance nosebleed earlier this week and stopped his Eliquis.  He has complaints of fatigue.  However he is able to do a lot of activities including working outside.  He denies any significant shortness of breath, paroxysmal nocturnal dyspnea, orthopnea.  He feels better after his cardioversion is well.  He denies any exertional angina.  He has not required emergency room visits or hospitalizations.  He is otherwise well and without complaints.  Plan: Check lipids, Lp(a), CMP, CBC, reflex TSH, and sleep study.  Restart Eliquis.  Today: All blood tests were within normal limits.  A sleep study was not performed.  He was recently seen by his pulmonologist.  He endorsed shortness of breath.  A BNP was checked and found to be elevated to 616.  His creatinine was stable around 1.  Because of these constellation of signs and symptoms he was referred for an expedited appointment.  His Lasix was increased from as needed to 20 mg twice daily.  He tells me he urinated quite a bit in response to the Lasix.  He feels much better however he continues to have some shortness of breath with more than moderate exertion.  He denies any palpitations or  severe bleeding.  He denies any paroxysmal nocturnal dyspnea, orthopnea, or exertional chest pain.  He does not wheeze very often.  He tells me after his cardioversion last year he felt much improved.  He cannot pinpoint a day when things became worse for him and describes more of an insidious course over the last few months.  He fortunately has not required any emergency room visits or hospitalizations.   Current Medications: Current Meds  Medication Sig   albuterol (VENTOLIN HFA) 108 (90 Base) MCG/ACT inhaler INHALE 2 PUFFS BY MOUTH EVERY 6 HOURS AS NEEDED FOR WHEEZING FOR SHORTNESS OF BREATH   apixaban (ELIQUIS) 5 MG TABS tablet Take 1 tablet (5 mg total) by mouth 2 (two) times daily.   atorvastatin (LIPITOR) 80 MG tablet TAKE 1 TABLET BY MOUTH ONCE DAILY AT  6PM   carvedilol (COREG) 25 MG tablet Take 1 tablet (25 mg total) by mouth 2 (two) times daily. Please call [phone] to schedule an appointment for June for future refills. Thank you.   empagliflozin (JARDIANCE) 10  MG TABS tablet Take 1 tablet (10 mg total) by mouth daily before breakfast.   ezetimibe (ZETIA) 10 MG tablet Take 1 tablet (10 mg total) by mouth daily.   furosemide (LASIX) 20 MG tablet Take 1 tablet (20 mg total) by mouth 2 (two) times daily.   meclizine (ANTIVERT) 25 MG tablet Take 1 tablet (25 mg total) by mouth 3 (three) times daily as needed for dizziness.   nilotinib (TASIGNA) 150 MG capsule Take 300 mg by mouth every 12 (twelve) hours.   nitroGLYCERIN (NITROSTAT) 0.4 MG SL tablet Place 0.4 mg under the tongue every 5 (five) minutes as needed for chest pain.   valsartan (DIOVAN) 160 MG tablet Take 160 mg by mouth daily.   [DISCONTINUED] clopidogrel (PLAVIX) 75 MG tablet Take 1 tablet by mouth once daily   [DISCONTINUED] losartan (COZAAR) 25 MG tablet Take 1 tablet (25 mg total) by mouth at bedtime.     Allergies:    Breztri aerosphere [budeson-glycopyrrol-formoterol], Norvasc [amlodipine besylate], and Sulfa  antibiotics   Social History:   Social History   Tobacco Use   Smoking status: Former    Packs/day: 3.00    Years: 45.00    Additional pack years: 0.00    Total pack years: 135.00    Types: Cigarettes    Quit date: 08/08/1997    Years since quitting: 25.2    Passive exposure: Past   Smokeless tobacco: Former  Scientific laboratory technician Use: Never used  Substance Use Topics   Alcohol use: No    Alcohol/week: 0.0 standard drinks of alcohol   Drug use: No     Family Hx: Family History  Problem Relation Age of Onset   Heart disease Mother    Alcohol abuse Father      Review of Systems:   Please see the history of present illness.    All other systems reviewed and are negative.     EKGs/Labs/Other Test Reviewed:    EKG: EKG performed in September 2023 demonstrates sinus rhythm with right bundle branch block; EKG today which I personally reviewed demonstrates atrial fibrillation with a right bundle branch block and left anterior fascicular block.  Prior CV studies:  Carotid ultrasound 2023 1. Mildly elevated peak systolic and end-diastolic velocities in the right internal carotid artery indicative of 50-69% stenosis. This has increased since prior examination. 2. Chronic occlusion of proximal left internal carotid artery.  TTE 2023 Ejection fraction of 40 to [id number] grade 1 diastolic dysfunction, severe akinesis of the basal mid inferoseptal and inferior wall, low normal right ventricular function and no significant valvular abnormalities   Lexiscan stress test 2018 Nuclear stress EF: 35%. Inferior/inferoseptal defect consistent with scar No significant ischemia Intermediate risk study    Other studies Reviewed: Review of the additional studies/records demonstrates: Chest CT 2018 demonstrates aortic atherosclerosis  Recent Labs: 12/06/2021: B Natriuretic Peptide 719.7 10/26/2022: ALT 12; BUN 15; Creatinine, Ser 1.02; Hemoglobin 12.4; Platelets 265.0; Potassium 4.2; Pro B  Natriuretic peptide (BNP) 616.0; Sodium 142; TSH 4.13   Recent Lipid Panel Lab Results  Component Value Date/Time   CHOL 137 02/03/2022 09:03 AM   TRIG 83 02/03/2022 09:03 AM   HDL 41 02/03/2022 09:03 AM   LDLCALC 80 02/03/2022 09:03 AM   LDLCALC 63 02/18/2021 08:35 AM    Risk Assessment/Calculations:     CHA2DS2-VASc Score = 4   This indicates a 4.8% annual risk of stroke. The patient's score is based upon: CHF History: 0 HTN History: 1  Diabetes History: 0 Stroke History: 0 Vascular Disease History: 1 Age Score: 2 Gender Score: 0         Physical Exam:    VS:  BP 100/60   Pulse 76   Ht 5\' 9"  (1.753 m)   Wt 185 lb (83.9 kg)   SpO2 97%   BMI 27.32 kg/m    Wt Readings from Last 3 Encounters:  11/02/22 185 lb (83.9 kg)  10/26/22 186 lb 6.4 oz (84.6 kg)  09/02/22 183 lb (83 kg)    GENERAL:  No apparent distress, AOx3 HEENT:  No carotid bruits, +2 carotid impulses, no scleral icterus, JVD not elevated CAR: Irregular no murmurs, gallops, rubs, or thrills RES:  Clear to auscultation bilaterally without wheezing ABD:  Soft, nontender, nondistended, positive bowel sounds x 4 VASC:  +2 radial pulses, +2 carotid pulses, palpable pedal pulses NEURO:  CN 2-12 grossly intact; motor and sensory grossly intact PSYCH:  No active depression or anxiety EXT:  No edema, ecchymosis, or cyanosis  Signed, Early Osmond, MD  11/02/2022 1:54 PM    [facility] [place], [place], [place]  [address] Phone: [phone]; Fax: [phone]   Note:  This document was prepared using Dragon voice recognition software and may include unintentional dictation errors.

## 2022-10-31 NOTE — Telephone Encounter (Signed)
This encounter was created in error - please disregard.

## 2022-10-31 NOTE — Telephone Encounter (Deleted)
Dr Melvyn Novas,  Anthony Wolfe patient and gave all lab and CXR results.  [place] per patient request an appointment and to notify Dr. Ali Lowe to review all labs. Patient has follow up with Dr. Ali Lowe at [facility] on 11/02/2022.   This  is Dr. Dara Hoyer response to Dr. Melvyn Novas:  Early Osmond, MD     10/26/22  6:19 PM Note I spoke with patient today and reviewed his labs.  I have advised him to start lasix 20mg  BID (up from PRN).  We will schedule him to be seen in our office for an evaluation with an EKG.  If recurrent AF is seen on EKG, will consider repeat cardioversion.  Patient understands and agrees with plan.

## 2022-11-02 ENCOUNTER — Ambulatory Visit: Payer: Medicare HMO | Attending: Internal Medicine | Admitting: Internal Medicine

## 2022-11-02 ENCOUNTER — Encounter: Payer: Self-pay | Admitting: Internal Medicine

## 2022-11-02 VITALS — BP 100/60 | HR 76 | Ht 69.0 in | Wt 185.0 lb

## 2022-11-02 DIAGNOSIS — I779 Disorder of arteries and arterioles, unspecified: Secondary | ICD-10-CM

## 2022-11-02 DIAGNOSIS — Z95810 Presence of automatic (implantable) cardiac defibrillator: Secondary | ICD-10-CM | POA: Diagnosis not present

## 2022-11-02 DIAGNOSIS — I255 Ischemic cardiomyopathy: Secondary | ICD-10-CM | POA: Diagnosis not present

## 2022-11-02 DIAGNOSIS — I4891 Unspecified atrial fibrillation: Secondary | ICD-10-CM

## 2022-11-02 DIAGNOSIS — E785 Hyperlipidemia, unspecified: Secondary | ICD-10-CM

## 2022-11-02 DIAGNOSIS — I2510 Atherosclerotic heart disease of native coronary artery without angina pectoris: Secondary | ICD-10-CM | POA: Diagnosis not present

## 2022-11-02 DIAGNOSIS — I700 Atherosclerosis of aorta: Secondary | ICD-10-CM

## 2022-11-02 DIAGNOSIS — R0609 Other forms of dyspnea: Secondary | ICD-10-CM

## 2022-11-02 DIAGNOSIS — R5383 Other fatigue: Secondary | ICD-10-CM

## 2022-11-02 NOTE — Patient Instructions (Addendum)
Medication Instructions:  Your physician has recommended you make the following change in your medication:  1.) stop losartan 2.) stop clopidogrel (Plavix)   *If you need a refill on your cardiac medications before your next appointment, please call your pharmacy*   Lab Work: Today: lipids, cmet  If you have labs (blood work) drawn today and your tests are completely normal, you will receive your results only by: [place] (if you have MyChart) OR A paper copy in the mail If you have any lab test that is abnormal or we need to change your treatment, we will call you to review the results.   Testing/Procedures: none   Follow-Up: With Dr. Caryl Comes for atrial fibrillation.    With Dr. Ali Lowe in 3 months (July 12th at 1:30 pm)

## 2022-11-03 LAB — LIPID PANEL
Chol/HDL Ratio: 2.5 ratio (ref 0.0–5.0)
Cholesterol, Total: 84 mg/dL — ABNORMAL LOW (ref 100–199)
HDL: 34 mg/dL — ABNORMAL LOW (ref >39)
LDL Chol Calc (NIH): 33 mg/dL (ref 0–99)
Triglycerides: 84 mg/dL (ref 0–149)
VLDL Cholesterol Cal: 17 mg/dL (ref 5–40)

## 2022-11-03 LAB — COMPREHENSIVE METABOLIC PANEL
ALT: 9 IU/L (ref 0–44)
AST: 11 IU/L (ref 0–40)
Albumin/Globulin Ratio: 1.3 (ref 1.2–2.2)
Albumin: 3.7 g/dL — ABNORMAL LOW (ref 3.8–4.8)
Alkaline Phosphatase: 96 IU/L (ref 44–121)
BUN/Creatinine Ratio: 18 (ref 10–24)
BUN: 21 mg/dL (ref 8–27)
Bilirubin Total: 0.6 mg/dL (ref 0.0–1.2)
CO2: 25 mmol/L (ref 20–29)
Calcium: 8.8 mg/dL (ref 8.6–10.2)
Chloride: 104 mmol/L (ref 96–106)
Creatinine, Ser: 1.18 mg/dL (ref 0.76–1.27)
Globulin, Total: 2.8 g/dL (ref 1.5–4.5)
Glucose: 87 mg/dL (ref 70–99)
Potassium: 4 mmol/L (ref 3.5–5.2)
Sodium: 142 mmol/L (ref 134–144)
Total Protein: 6.5 g/dL (ref 6.0–8.5)
eGFR: 63 mL/min/{1.73_m2} (ref >59)

## 2022-11-14 ENCOUNTER — Inpatient Hospital Stay: Payer: Medicare HMO | Attending: Hematology and Oncology

## 2022-11-14 ENCOUNTER — Other Ambulatory Visit: Payer: Self-pay

## 2022-11-14 DIAGNOSIS — I4891 Unspecified atrial fibrillation: Secondary | ICD-10-CM | POA: Insufficient documentation

## 2022-11-14 DIAGNOSIS — C9210 Chronic myeloid leukemia, BCR/ABL-positive, not having achieved remission: Secondary | ICD-10-CM | POA: Diagnosis not present

## 2022-11-14 LAB — CBC WITH DIFFERENTIAL/PLATELET
Abs Immature Granulocytes: 0.03 10*3/uL (ref 0.00–0.07)
Basophils Absolute: 0.1 10*3/uL (ref 0.0–0.1)
Basophils Relative: 1 %
Eosinophils Absolute: 0.2 10*3/uL (ref 0.0–0.5)
Eosinophils Relative: 2 %
HCT: 38.7 % — ABNORMAL LOW (ref 39.0–52.0)
Hemoglobin: 12.1 g/dL — ABNORMAL LOW (ref 13.0–17.0)
Immature Granulocytes: 0 %
Lymphocytes Relative: 14 %
Lymphs Abs: 1 10*3/uL (ref 0.7–4.0)
MCH: 30 pg (ref 26.0–34.0)
MCHC: 31.3 g/dL (ref 30.0–36.0)
MCV: 96 fL (ref 80.0–100.0)
Monocytes Absolute: 0.6 10*3/uL (ref 0.1–1.0)
Monocytes Relative: 9 %
Neutro Abs: 5.2 10*3/uL (ref 1.7–7.7)
Neutrophils Relative %: 74 %
Platelets: 194 10*3/uL (ref 150–400)
RBC: 4.03 MIL/uL — ABNORMAL LOW (ref 4.22–5.81)
RDW: 14.8 % (ref 11.5–15.5)
WBC: 7.1 10*3/uL (ref 4.0–10.5)
nRBC: 0 % (ref 0.0–0.2)

## 2022-11-14 LAB — COMPREHENSIVE METABOLIC PANEL
ALT: 12 U/L (ref 0–44)
AST: 11 U/L — ABNORMAL LOW (ref 15–41)
Albumin: 3.9 g/dL (ref 3.5–5.0)
Alkaline Phosphatase: 88 U/L (ref 38–126)
Anion gap: 5 (ref 5–15)
BUN: 16 mg/dL (ref 8–23)
CO2: 25 mmol/L (ref 22–32)
Calcium: 9.1 mg/dL (ref 8.9–10.3)
Chloride: 111 mmol/L (ref 98–111)
Creatinine, Ser: 1.17 mg/dL (ref 0.61–1.24)
GFR, Estimated: >60 mL/min (ref >60)
Glucose, Bld: 101 mg/dL — ABNORMAL HIGH (ref 70–99)
Potassium: 4.8 mmol/L (ref 3.5–5.1)
Sodium: 141 mmol/L (ref 135–145)
Total Bilirubin: 0.6 mg/dL (ref 0.3–1.2)
Total Protein: 7 g/dL (ref 6.5–8.1)

## 2022-11-17 ENCOUNTER — Ambulatory Visit: Payer: Medicare HMO

## 2022-11-17 VITALS — Ht 69.0 in | Wt 185.0 lb

## 2022-11-17 DIAGNOSIS — Z0000 Encounter for general adult medical examination without abnormal findings: Secondary | ICD-10-CM | POA: Diagnosis not present

## 2022-11-17 NOTE — Progress Notes (Signed)
Subjective:   Anthony Wolfe is a 77 y.o. male who presents for Medicare Annual/Subsequent preventive examination.  I connected with  Franz Dell on 11/17/22 by a audio enabled telemedicine application and verified that I am speaking with the correct person using two identifiers.  Patient Location: Home  Provider Location: Office/Clinic  I discussed the limitations of evaluation and management by telemedicine. The patient expressed understanding and agreed to proceed.  Review of Systems     Cardiac Risk Factors include: advanced age (>63men, >75 women);male gender;hypertension;dyslipidemia     Objective:    Today's Vitals   11/17/22 1008  Weight: 185 lb (83.9 kg)  Height: 5\' 9"  (1.753 m)   Body mass index is 27.32 kg/m.     11/17/2022   10:13 AM 01/04/2022    9:03 AM 12/07/2021    8:22 AM 11/11/2021   10:53 AM 08/10/2021   10:47 AM 08/06/2021   12:43 PM 03/26/2020    9:29 AM  Advanced Directives  Does Patient Have a Medical Advance Directive? Yes Yes No Yes Yes Yes Yes  Type of Advance Directive Living will;Healthcare Power of [employer] Power of [employer] Power of [place];Living will Healthcare Power of [employer] of [place];Living will Living will;Healthcare Power of Attorney  Does patient want to make changes to medical advance directive? No - Patient declined      No - Patient declined  Copy of Healthcare Power of Attorney in Chart? Yes - validated most recent copy scanned in chart (See row information)   Yes - validated most recent copy scanned in chart (See row information) No - copy requested No - copy requested   Would patient like information on creating a medical advance directive?   No - Patient declined        Current Medications (verified) Outpatient Encounter Medications as of 11/17/2022  Medication Sig   albuterol (VENTOLIN HFA) 108 (90 Base) MCG/ACT inhaler INHALE 2 PUFFS BY MOUTH EVERY 6 HOURS AS NEEDED FOR WHEEZING FOR  SHORTNESS OF BREATH   apixaban (ELIQUIS) 5 MG TABS tablet Take 1 tablet (5 mg total) by mouth 2 (two) times daily.   atorvastatin (LIPITOR) 80 MG tablet TAKE 1 TABLET BY MOUTH ONCE DAILY AT  6PM   carvedilol (COREG) 25 MG tablet Take 1 tablet (25 mg total) by mouth 2 (two) times daily. Please call [phone] to schedule an appointment for June for future refills. Thank you.   empagliflozin (JARDIANCE) 10 MG TABS tablet Take 1 tablet (10 mg total) by mouth daily before breakfast.   ezetimibe (ZETIA) 10 MG tablet Take 1 tablet (10 mg total) by mouth daily.   furosemide (LASIX) 20 MG tablet Take 1 tablet (20 mg total) by mouth 2 (two) times daily.   meclizine (ANTIVERT) 25 MG tablet Take 1 tablet (25 mg total) by mouth 3 (three) times daily as needed for dizziness.   nilotinib (TASIGNA) 150 MG capsule Take 300 mg by mouth every 12 (twelve) hours.   nitroGLYCERIN (NITROSTAT) 0.4 MG SL tablet Place 0.4 mg under the tongue every 5 (five) minutes as needed for chest pain.   valsartan (DIOVAN) 160 MG tablet Take 160 mg by mouth daily.   No facility-administered encounter medications on file as of 11/17/2022.    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 difficile colitis 06/16/2014   CAD (coronary artery disease)    a. s/p 3v CABG 2010. b. NSTEMI 05/2014 in setting of VT. Cath impressions: "Recent IMI with occluded SVG to RCA. Has Right to Right collaterals and collaterals from septal and OM. EF 40%."   Carotid disease, bilateral    a. Right CEA 2002; known occluded left carotid. b. Duplex 06/2014: known occluded LICA, stable 1-39% RICA s/p CEA.   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 Prior EF 36%. b. 2014: 50%. c. 05/2014: EF 35-40% by echo, 40% with inf HK by cath.   NSTEMI (non-ST elevation myocardial infarction) 05/2014   Pleural effusion 05/2014   Pneumonia 05/23/2014   Presence of permanent cardiac pacemaker    Prostate hypertrophy    PVC's (premature ventricular contractions)    a. PVC's with prior Holter showing PVC load of 22%.   Shortness of breath dyspnea    Stroke 01   TIA (transient ischemic attack)    ASPVD, S./P. right CEA, 2002, and known occluded left carotid   Ventricular tachycardia    a. Admitted with VT 05/2013 - EPS with inducible sustained monomorphic VT; s/p Medtronic ICD 05/21/14.   Past Surgical History:  Procedure Laterality Date   BACK SURGERY     CARDIOVERSION N/A 01/04/2022   Procedure: CARDIOVERSION;  Surgeon: Parke Poisson, MD;  Location: [facility] ENDOSCOPY;  Service: Cardiovascular;  Laterality: N/A;   CAROTID ENDARTERECTOMY Right 01   carotidectomy  2003   right side   COLONOSCOPY  07/2010   CORONARY ARTERY BYPASS GRAFT  02/2009   3 vessel   ELECTROPHYSIOLOGIC STUDY  05/21/14   ELECTROPHYSIOLOGY STUDY N/A 05/21/2014   Procedure: ELECTROPHYSIOLOGY STUDY;  Surgeon: Duke Salvia, MD;  Location: [facility] CATH LAB;  Service: Cardiovascular;  Laterality: N/A;   EP IMPLANTABLE DEVICE  05/21/14   single chamber Metronic ICD   IR THORACENTESIS ASP PLEURAL SPACE W/IMG GUIDE  12/07/2021   LEFT HEART CATHETERIZATION WITH CORONARY/GRAFT ANGIOGRAM N/A 05/19/2014   Procedure: LEFT HEART CATHETERIZATION WITH Isabel Caprice;  Surgeon: Wendall Stade, MD;  Location: [facility] CATH LAB;  Service: Cardiovascular;  Laterality: N/A;   PLEURAL BIOPSY N/A 11/27/2014   Procedure: PLEURAL BIOPSY;  Surgeon: [name], MD;  Location: [facility] OR;  Service: Thoracic;  Laterality: N/A;   PLEURAL EFFUSION  DRAINAGE Right 11/27/2014   Procedure: DRAINAGE OF PLEURAL EFFUSION;  Surgeon: [name], MD;  Location: [facility] OR;  Service: Thoracic;  Laterality: Right;   TALC PLEURODESIS Right 11/27/2014   Procedure: Lurlean Nanny;  Surgeon: [name], MD;  Location: [facility] OR;  Service: Thoracic;  Laterality: Right;   VIDEO ASSISTED THORACOSCOPY Right 11/27/2014   Procedure: RIGHT VIDEO ASSISTED THORACOSCOPY;  Surgeon: [name], MD;  Location: [facility] OR;  Service: Thoracic;  Laterality: Right;   Family History  Problem Relation Age of Onset   Heart disease Mother    Alcohol abuse Father    Social History   Socioeconomic History   Marital status: Married    Spouse name: Not on file   Number of children: 2   Years of education: Not on file   Highest education level: Not  on file  Occupational History    Comment: retired Administrator  Tobacco Use   Smoking status: Former    Packs/day: 3.00    Years: 45.00    Additional pack years: 0.00    Total pack years: 135.00    Types: Cigarettes    Quit date: 08/08/1997    Years since quitting: 25.2    Passive exposure: Past   Smokeless tobacco: Former  Building services engineer Use: Never used  Substance and Sexual Activity   Alcohol use: No    Alcohol/week: 0.0 standard drinks of alcohol   Drug use: No   Sexual activity: Yes  Other Topics Concern   Not on file  Social History Narrative   Not on file   Social Determinants of Health   Financial Resource Strain: Low Risk  (11/17/2022)   Overall Financial Resource Strain (CARDIA)    Difficulty of Paying Living Expenses: Not hard at all  Food Insecurity: No Food Insecurity (11/17/2022)   Hunger Vital Sign    Worried About Running Out of Food in the Last Year: Never true    Ran Out of Food in the Last Year: Never true  Transportation Needs: No Transportation Needs (11/17/2022)   PRAPARE - Administrator, Civil Service (Medical): No    Lack of Transportation  (Non-Medical): No  Physical Activity: Insufficiently Active (11/11/2021)   Exercise Vital Sign    Days of Exercise per Week: 3 days    Minutes of Exercise per Session: 30 min  Stress: No Stress Concern Present (11/17/2022)   [employer] of Occupational Health - Occupational Stress Questionnaire    Feeling of Stress : Not at all  Social Connections: Socially Integrated (11/17/2022)   Social Connection and Isolation Panel [NHANES]    Frequency of Communication with Friends and Family: More than three times a week    Frequency of Social Gatherings with Friends and Family: More than three times a week    Attends Religious Services: More than 4 times per year    Active Member of [employer] or Organizations: Yes    Attends Engineer, structural: More than 4 times per year    Marital Status: Married    Tobacco Counseling Counseling given: Not Answered   Clinical Intake:  Pre-visit preparation completed: Yes  Pain : No/denies pain  Diabetes: No  How often do you need to have someone help you when you read instructions, pamphlets, or other written materials from your doctor or pharmacy?: 1 - Never  Diabetic?No   Interpreter Needed?: No  Information entered by :: Kandis Fantasia LPN   Activities of Daily Living    11/17/2022   10:13 AM  In your present state of health, do you have any difficulty performing the following activities:  Hearing? 0  Vision? 0  Difficulty concentrating or making decisions? 0  Walking or climbing stairs? 1  Dressing or bathing? 0  Doing errands, shopping? 1  Comment due to resp. condition  Preparing Food and eating ? N  Using the Toilet? N  In the past six months, have you accidently leaked urine? N  Do you have problems with loss of bowel control? N  Managing your Medications? N  Managing your Finances? N  Housekeeping or managing your Housekeeping? N    Patient Care Team: [name], MD as PCP - General (Family  Medicine) [name], MD as PCP - Cardiology (Cardiology) [name], MD as Attending Physician (Cardiology) [place],  Priscille Heidelberg, MD (Family Medicine) [name], MD (Cardiothoracic Surgery) Early, [name], MD as Attending Physician (Vascular Surgery) [name], MD as Consulting Physician (Hematology and Oncology) Duke Salvia, MD as Consulting Physician (Cardiology) Erroll Luna, [facility] as Pharmacist (Pharmacist) Erroll Luna, [facility] as Pharmacist (Pharmacist) [facility]  Indicate any recent Medical Services you may have received from other than [facility] providers in the past year (date may be approximate).     Assessment:   This is a routine wellness examination for Anthony Wolfe.  Hearing/Vision screen Hearing Screening - Comments:: Denies hearing difficulties   Vision Screening - Comments:: up to date with routine eye exams with Dr. Dione Booze    Dietary issues and exercise activities discussed: Current Exercise Habits: The patient does not participate in regular exercise at present, Exercise limited by: respiratory conditions(s)   Goals Addressed             This Visit's Progress    COMPLETED: Track and Manage My Blood Pressure-Hypertension       Timeframe:  Long-Range Goal Priority:  High Start Date:   07/15/21                          Expected End Date:  01/13/22                     Follow Up Date 10/13/21    - check blood pressure daily - choose a place to take my blood pressure (home, clinic or office, retail store) - write blood pressure results in a log or diary    Why is this important?   You won't feel high blood pressure, but it can still hurt your blood vessels.  High blood pressure can cause heart or kidney problems. It can also cause a stroke.  Making lifestyle changes like losing a little weight or eating less salt will help.  Checking your blood pressure at home and at different times of the day can help to control  blood pressure.  If the doctor prescribes medicine remember to take it the way the doctor ordered.  Call the office if you cannot afford the medicine or if there are questions about it.     Notes: CMA to call in 2 weeks     COMPLETED: Track and Manage My Symptoms-COPD       Timeframe:  Long-Range Goal Priority:  High Start Date: 07/15/21                            Expected End Date: 01/13/22                       Follow Up Date 10/13/21    - begin a symptom diary - eliminate symptom triggers at home    Why is this important?   Tracking your symptoms and other information about your health helps your doctor plan your care.  Write down the symptoms, the time of day, what you were doing and what medicine you are taking.  You will soon learn how to manage your symptoms.     Notes:       Depression Screen    11/17/2022   10:17 AM 09/02/2022    8:11 AM 04/05/2022   11:51 AM 11/11/2021   10:50 AM 06/03/2021    8:05 AM 02/18/2021    8:20 AM 10/21/2020  8:26 AM  PHQ 2/9 Scores  PHQ - 2 Score 0 0 4 0 3 0 0  PHQ- 9 Score   10  13      Fall Risk    11/17/2022   10:08 AM 09/02/2022    8:11 AM 04/05/2022   11:56 AM 11/11/2021   10:54 AM 06/03/2021    8:05 AM  Fall Risk   Falls in the past year? 0 0 0 0 0  Number falls in past yr: 0 0 0 0 0  Injury with Fall? 0 0 0 0 0  Risk for fall due to : No Fall Risks No Fall Risks No Fall Risks Impaired balance/gait;Impaired mobility No Fall Risks  Follow up Falls prevention discussed;Education provided;Falls evaluation completed Falls prevention discussed Falls prevention discussed Falls prevention discussed Falls evaluation completed    FALL RISK PREVENTION PERTAINING TO THE HOME:  Any stairs in or around the home? No  If so, are there any without handrails? No  Home free of loose throw rugs in walkways, pet beds, electrical cords, etc? Yes  Adequate lighting in your home to reduce risk of falls? Yes   ASSISTIVE DEVICES UTILIZED TO  PREVENT FALLS:  Life alert? No  Use of a cane, walker or w/c? No  Grab bars in the bathroom? Yes  Shower chair or bench in shower? No  Elevated toilet seat or a handicapped toilet? Yes   TIMED UP AND GO:  Was the test performed? No . Telephonic visit   Cognitive Function:        11/17/2022   10:13 AM 11/11/2021   10:56 AM 03/26/2020    9:32 AM  6CIT Screen  What Year? 0 points 0 points 0 points  What month? 0 points 0 points 0 points  What time? 0 points 0 points 0 points  Count back from 20 0 points 0 points 0 points  Months in reverse 0 points 0 points 0 points  Repeat phrase 0 points 0 points 0 points  Total Score 0 points 0 points 0 points    Immunizations Immunization History  Administered Date(s) Administered   Fluad Quad(high Dose 65+) 05/14/2019   Influenza, High Dose Seasonal PF 06/22/2018   Influenza,inj,Quad PF,6+ Mos 05/21/2013, 05/07/2014, 04/28/2015, 04/28/2016, 05/09/2017   Pneumococcal Conjugate-13 01/29/2015   Pneumococcal Polysaccharide-23 05/07/2014    TDAP status: Due, Education has been provided regarding the importance of this vaccine. Advised may receive this vaccine at local pharmacy or Health Dept. Aware to provide a copy of the vaccination record if obtained from local pharmacy or Health Dept. Verbalized acceptance and understanding.  Flu Vaccine status: Up to date  Pneumococcal vaccine status: Up to date  Covid-19 vaccine status: Information provided on how to obtain vaccines.   Qualifies for Shingles Vaccine? Yes   Zostavax completed No   Shingrix Completed?: No.    Education has been provided regarding the importance of this vaccine. Patient has been advised to call insurance company to determine out of pocket expense if they have not yet received this vaccine. Advised may also receive vaccine at local pharmacy or Health Dept. Verbalized acceptance and understanding.  Screening Tests Health Maintenance  Topic Date Due   Hepatitis C  Screening  09/03/2023 (Originally 01/21/1964)   INFLUENZA VACCINE  03/09/2023   Medicare Annual Wellness (AWV)  11/17/2023   Pneumonia Vaccine 4565+ Years old  Completed   HPV VACCINES  Aged Out   DTaP/Tdap/Td  Discontinued   COVID-19 Vaccine  Discontinued  Zoster Vaccines- Shingrix  Discontinued    Health Maintenance  There are no preventive care reminders to display for this patient.   Colorectal cancer screening: No longer required.   Lung Cancer Screening: (Low Dose CT Chest recommended if Age 56-80 years, 30 pack-year currently smoking OR have quit w/in 15years.) does not qualify.   Lung Cancer Screening Referral: n/a  Additional Screening:  Hepatitis C Screening: does qualify;  Vision Screening: Recommended annual ophthalmology exams for early detection of glaucoma and other disorders of the eye. Is the patient up to date with their annual eye exam?  Yes  Who is the provider or what is the name of the office in which the patient attends annual eye exams? [facility]  If pt is not established with a provider, would they like to be referred to a provider to establish care? No .   Dental Screening: Recommended annual dental exams for proper oral hygiene  Community Resource Referral / Chronic Care Management: CRR required this visit?  No   CCM required this visit?  No      Plan:     I have personally reviewed and noted the following in the patient's chart:   Medical and social history Use of alcohol, tobacco or illicit drugs  Current medications and supplements including opioid prescriptions. Patient is not currently taking opioid prescriptions. Functional ability and status Nutritional status Physical activity Advanced directives List of other physicians Hospitalizations, surgeries, and ER visits in previous 12 months Vitals Screenings to include cognitive, depression, and falls Referrals and appointments  In addition, I have reviewed and discussed with patient  certain preventive protocols, quality metrics, and best practice recommendations. A written personalized care plan for preventive services as well as general preventive health recommendations were provided to patient.     Durwin Nora, [place]   8/33/8250   Due to this being a virtual visit, the after visit summary with patients personalized plan was offered to patient via mail or my-chart. per request, patient was mailed a copy of AVS.  Nurse Notes: No concerns

## 2022-11-17 NOTE — Patient Instructions (Addendum)
[name] , Thank you for taking time to come for your Medicare Wellness Visit. I appreciate your ongoing commitment to your health goals. Please review the following plan we discussed and let me know if I can assist you in the future.   These are the goals we discussed:  Goals      Exercise 150 min/wk Moderate Activity     Increase as tolerated.         This is a list of the screening recommended for you and due dates:  Health Maintenance  Topic Date Due   Hepatitis C Screening: USPSTF Recommendation to screen - Ages 18-79 yo.  09/03/2023*   Flu Shot  03/09/2023   Medicare Annual Wellness Visit  11/17/2023   Pneumonia Vaccine  Completed   HPV Vaccine  Aged Out   DTaP/Tdap/Td vaccine  Discontinued   COVID-19 Vaccine  Discontinued   Zoster (Shingles) Vaccine  Discontinued  *Topic was postponed. The date shown is not the original due date.    Advanced directives: We have a copy of your advanced directives available in your record should your provider ever need to access them.   Conditions/risks identified: Aim for 30 minutes of exercise or brisk walking, 6-8 glasses of water, and 5 servings of fruits and vegetables each day.   Next appointment: Follow up in one year for your annual wellness visit.   Preventive Care 29 Years and Older, Male  Preventive care refers to lifestyle choices and visits with your health care provider that can promote health and wellness. What does preventive care include? A yearly physical exam. This is also called an annual well check. Dental exams once or twice a year. Routine eye exams. Ask your health care provider how often you should have your eyes checked. Personal lifestyle choices, including: Daily care of your teeth and gums. Regular physical activity. Eating a healthy diet. Avoiding tobacco and drug use. Limiting alcohol use. Practicing safe sex. Taking low doses of aspirin every day. Taking vitamin and mineral supplements as  recommended by your health care provider. What happens during an annual well check? The services and screenings done by your health care provider during your annual well check will depend on your age, overall health, lifestyle risk factors, and family history of disease. Counseling  Your health care provider may ask you questions about your: Alcohol use. Tobacco use. Drug use. Emotional well-being. Home and relationship well-being. Sexual activity. Eating habits. History of falls. Memory and ability to understand (cognition). Work and work Astronomer. Screening  You may have the following tests or measurements: Height, weight, and BMI. Blood pressure. Lipid and cholesterol levels. These may be checked every 5 years, or more frequently if you are over 34 years old. Skin check. Lung cancer screening. You may have this screening every year starting at age 79 if you have a 30-pack-year history of smoking and currently smoke or have quit within the past 15 years. Fecal occult blood test (FOBT) of the stool. You may have this test every year starting at age 79. Flexible sigmoidoscopy or colonoscopy. You may have a sigmoidoscopy every 5 years or a colonoscopy every 10 years starting at age 79. Prostate cancer screening. Recommendations will vary depending on your family history and other risks. Hepatitis C blood test. Hepatitis B blood test. Sexually transmitted disease (STD) testing. Diabetes screening. This is done by checking your blood sugar (glucose) after you have not eaten for a while (fasting). You may have this done every 1-3  years. Abdominal aortic aneurysm (AAA) screening. You may need this if you are a current or former smoker. Osteoporosis. You may be screened starting at age 79 if you are at high risk. Talk with your health care provider about your test results, treatment options, and if necessary, the need for more tests. Vaccines  Your health care provider may recommend  certain vaccines, such as: Influenza vaccine. This is recommended every year. Tetanus, diphtheria, and acellular pertussis (Tdap, Td) vaccine. You may need a Td booster every 10 years. Zoster vaccine. You may need this after age 79. Pneumococcal 13-valent conjugate (PCV13) vaccine. One dose is recommended after age 79. Pneumococcal polysaccharide (PPSV23) vaccine. One dose is recommended after age 79. Talk to your health care provider about which screenings and vaccines you need and how often you need them. This information is not intended to replace advice given to you by your health care provider. Make sure you discuss any questions you have with your health care provider. Document Released: 08/21/2015 Document Revised: 04/13/2016 Document Reviewed: 05/26/2015 Elsevier Interactive Patient Education  2017 [place] Prevention in the Home Falls can cause injuries. They can happen to people of all ages. There are many things you can do to make your home safe and to help prevent falls. What can I do on the outside of my home? Regularly fix the edges of walkways and driveways and fix any cracks. Remove anything that might make you trip as you walk through a door, such as a raised step or threshold. Trim any bushes or trees on the path to your home. Use bright outdoor lighting. Clear any walking paths of anything that might make someone trip, such as rocks or tools. Regularly check to see if handrails are loose or broken. Make sure that both sides of any steps have handrails. Any raised decks and porches should have guardrails on the edges. Have any leaves, snow, or ice cleared regularly. Use sand or salt on walking paths during winter. Clean up any spills in your garage right away. This includes oil or grease spills. What can I do in the bathroom? Use night lights. Install grab bars by the toilet and in the tub and shower. Do not use towel bars as grab bars. Use non-skid mats or  decals in the tub or shower. If you need to sit down in the shower, use a plastic, non-slip stool. Keep the floor dry. Clean up any water that spills on the floor as soon as it happens. Remove soap buildup in the tub or shower regularly. Attach bath mats securely with double-sided non-slip rug tape. Do not have throw rugs and other things on the floor that can make you trip. What can I do in the bedroom? Use night lights. Make sure that you have a light by your bed that is easy to reach. Do not use any sheets or blankets that are too big for your bed. They should not hang down onto the floor. Have a firm chair that has side arms. You can use this for support while you get dressed. Do not have throw rugs and other things on the floor that can make you trip. What can I do in the kitchen? Clean up any spills right away. Avoid walking on wet floors. Keep items that you use a lot in easy-to-reach places. If you need to reach something above you, use a strong step stool that has a grab bar. Keep electrical cords out of the way. Do  not use floor polish or wax that makes floors slippery. If you must use wax, use non-skid floor wax. Do not have throw rugs and other things on the floor that can make you trip. What can I do with my stairs? Do not leave any items on the stairs. Make sure that there are handrails on both sides of the stairs and use them. Fix handrails that are broken or loose. Make sure that handrails are as long as the stairways. Check any carpeting to make sure that it is firmly attached to the stairs. Fix any carpet that is loose or worn. Avoid having throw rugs at the top or bottom of the stairs. If you do have throw rugs, attach them to the floor with carpet tape. Make sure that you have a light switch at the top of the stairs and the bottom of the stairs. If you do not have them, ask someone to add them for you. What else can I do to help prevent falls? Wear shoes that: Do not  have high heels. Have rubber bottoms. Are comfortable and fit you well. Are closed at the toe. Do not wear sandals. If you use a stepladder: Make sure that it is fully opened. Do not climb a closed stepladder. Make sure that both sides of the stepladder are locked into place. Ask someone to hold it for you, if possible. Clearly mark and make sure that you can see: Any grab bars or handrails. First and last steps. Where the edge of each step is. Use tools that help you move around (mobility aids) if they are needed. These include: Canes. Walkers. Scooters. Crutches. Turn on the lights when you go into a dark area. Replace any light bulbs as soon as they burn out. Set up your furniture so you have a clear path. Avoid moving your furniture around. If any of your floors are uneven, fix them. If there are any pets around you, be aware of where they are. Review your medicines with your doctor. Some medicines can make you feel dizzy. This can increase your chance of falling. Ask your doctor what other things that you can do to help prevent falls. This information is not intended to replace advice given to you by your health care provider. Make sure you discuss any questions you have with your health care provider. Document Released: 05/21/2009 Document Revised: 12/31/2015 Document Reviewed: 08/29/2014 Elsevier Interactive Patient Education  2017 [employer].

## 2022-11-21 ENCOUNTER — Ambulatory Visit: Payer: Medicare HMO | Attending: Internal Medicine | Admitting: Internal Medicine

## 2022-11-21 VITALS — BP 112/64 | HR 89 | Ht 69.0 in | Wt 182.2 lb

## 2022-11-21 DIAGNOSIS — Z95810 Presence of automatic (implantable) cardiac defibrillator: Secondary | ICD-10-CM | POA: Diagnosis not present

## 2022-11-21 DIAGNOSIS — I5020 Unspecified systolic (congestive) heart failure: Secondary | ICD-10-CM

## 2022-11-21 DIAGNOSIS — I493 Ventricular premature depolarization: Secondary | ICD-10-CM | POA: Diagnosis not present

## 2022-11-21 DIAGNOSIS — I483 Typical atrial flutter: Secondary | ICD-10-CM | POA: Diagnosis not present

## 2022-11-21 DIAGNOSIS — I472 Ventricular tachycardia, unspecified: Secondary | ICD-10-CM

## 2022-11-21 DIAGNOSIS — I255 Ischemic cardiomyopathy: Secondary | ICD-10-CM | POA: Diagnosis not present

## 2022-11-21 NOTE — Patient Instructions (Addendum)
Medication Instructions:  Your physician recommends that you continue on your current medications as directed. Please refer to the Current Medication list given to you today.  *If you need a refill on your cardiac medications before your next appointment, please call your pharmacy*   Lab Work: None ordered.  If you have labs (blood work) drawn today and your tests are completely normal, you will receive your results only by: MyChart Message (if you have MyChart) OR A paper copy in the mail If you have any lab test that is abnormal or we need to change your treatment, we will call you to review the results.   Testing/Procedures: Your physician has recommended that you have an ablation. Catheter ablation is a medical procedure used to treat some cardiac arrhythmias (irregular heartbeats). During catheter ablation, a long, thin, flexible tube is put into a blood vessel in your groin (upper thigh), or neck. This tube is called an ablation catheter. It is then guided to your heart through the blood vessel. Radio frequency waves destroy small areas of heart tissue where abnormal heartbeats may cause an arrhythmia to start. Please see the instruction sheet given to you today.    Follow-Up: At [facility], you and your health needs are our priority.  As part of our continuing mission to provide you with exceptional heart care, we have created designated Provider Care Teams.  These Care Teams include your primary Cardiologist (physician) and Advanced Practice Providers (APPs -  Physician Assistants and Nurse Practitioners) who all work together to provide you with the care you need, when you need it.  We recommend signing up for the patient portal called "MyChart".  Sign up information is provided on this After Visit Summary.  MyChart is used to connect with patients for Virtual Visits (Telemedicine).  Patients are able to view lab/test results, encounter notes, upcoming appointments, etc.   Non-urgent messages can be sent to your provider as well.   To learn more about what you can do with MyChart, go to [email or web address].    Your next appointment:   To be scheduled - You will be contacted by Dr Lubertha Basque nurse to set up ablation procedure     Dear Anthony Wolfe  You are scheduled for a Cardioversion on Thursday, April 18 with Dr. Servando Salina.  Please arrive at the [facility] (Main Entrance A) at [facility]: [address] [place], [place] [address] at 1030am.   DIET:  Nothing to eat or drink after midnight except a sip of water with medications (see medication instructions below)  MEDICATION INSTRUCTIONS:  Please do not take Furosemide the morning of your procedure. !!IF ANY NEW MEDICATIONS ARE STARTED AFTER TODAY, PLEASE NOTIFY YOUR PROVIDER AS SOON AS POSSIBLE!!  FYI: Medications such as Semaglutide (Ozempic, [place]), Tirzepatide (Mounjaro, Zepbound), Dulaglutide (Trulicity), etc ("GLP1 agonists") must be held around the time of a procedure. Talk to your provider if you take one of these.  }Continue taking your anticoagulant (blood thinner): Apixaban (Eliquis).  You will need to continue this after your procedure until you are told by your provider that it is safe to stop.    LABS:   Your labs will be done at the hospital prior to your procedure - you will need to arrive 1 and 1/2 hours prior to your procedure.  FYI:  For your safety, and to allow [place] to monitor your vital signs accurately during the surgery/procedure we request: If you have artificial nails, gel coating, SNS etc,  please have those removed prior to your surgery/procedure. Not having the nail coverings /polish removed may result in cancellation or delay of your surgery/procedure.  You must have a responsible person to drive you home and stay in the waiting area during your procedure. Failure to do so could result in cancellation.  Bring your insurance cards.  *Special Note: Every effort is  made to have your procedure done on time. Occasionally there are emergencies that occur at the hospital that may cause delays. Please be patient if a delay does occur.

## 2022-11-21 NOTE — H&P (View-Only) (Signed)
.      Patient Care Team: [name], MD as PCP - General (Family Medicine) [name], MD as PCP - Cardiology (Cardiology) [name], MD as Attending Physician (Cardiology) [name], MD (Family Medicine) [name], MD (Cardiothoracic Surgery) Early, [name], MD as Attending Physician (Vascular Surgery) [name], MD as Consulting Physician (Hematology and Oncology) Duke Salvia, MD as Consulting Physician (Cardiology) Erroll Luna, [facility] as Pharmacist (Pharmacist) Erroll Luna, [facility] as Pharmacist (Pharmacist) [facility]   HPI  Anthony Wolfe is a 77 y.o. male Seen in follow-up for an .Medtronic  ICD implanted for secondary prevention after he presented with ventricular tachycardia 10/15. Catheterization demonstrated occluded vein graft to his RCA ejection fraction was about 40%  He is status post bypass surgery 2010  He underwent EP testing and was found to be inducible.  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   He underwent a thoracentesis initially in 11/15 and redo 12/15.  He saw pulm 1/16 with question as to whether immunotherpay could be explanation of effusion;  He had pleurodesis.  4/23 also found to have recurrent pleural effusions.  Laboratories most consistent with transudate  4/23 referred back to cardiology with a diagnosis of "atrial fibrillation "ECG reviewed--typical atrial flutter underwent cardioversion with significant improvement in dyspnea. Now with recurrent flutter associated with very limited shortness of breath.  Bendopnea, cannot crank weedeater, short of breath rolling over in bed.  Surprisingly no orthopnea or recumbent dyspnea.  No peripheral edema.  DATE TEST EF   9/14 Myoview   EF ?? No ischemia  3/171 Carotids R ICA 40-59%//L ICA- T   10/17 Echo  45-50 % LAE 46/2.26/35  3/23 Echo  40-45%         Antiarrhythmics Date Reason stopped  amio  intolerant  [place]  Insurance refusal     Previously on aspirin and Plavix with a history of remote TIA, now on apixaban  Date Cr K Mg Hgb  5/18  1.1 3.7     11/18 0.9 3.9 2.3 11.5  1/20 1.05 4.3  12.8  3/21 0.94 3.9 1.8 11.1  4/24 1.17 4.8  12.1    Thromboembolic risk factors ( age - -2, HTN-1, TIA/CVA-2, Vasc disease -1, CHF-1) for a CHADSVASc Score of >=7 Past Medical History:  Diagnosis Date   AICD (automatic cardioverter/defibrillator) present    Allergic rhinitis    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 difficile colitis 06/16/2014   CAD (coronary artery disease)    a. s/p 3v CABG 2010. b. NSTEMI 05/2014 in setting of VT. Cath impressions: "Recent IMI with occluded SVG to RCA. Has Right to Right collaterals and collaterals from septal and OM. EF 40%."   Carotid disease, bilateral    a. Right CEA 2002; known occluded left carotid. b. Duplex 06/2014: known occluded LICA, stable 1-39% RICA s/p CEA.   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 Prior EF 36%. b. 2014: 50%. c. 05/2014: EF 35-40% by echo, 40% with inf HK by cath.   NSTEMI (non-ST elevation myocardial infarction) 05/2014   Pleural effusion 05/2014   Pneumonia  05/23/2014   Presence of permanent cardiac pacemaker    Prostate hypertrophy    PVC's (premature ventricular contractions)    a. PVC's with prior Holter showing PVC load of 22%.   Shortness of breath dyspnea    Stroke 01   TIA (transient ischemic attack)    ASPVD, S./P. right CEA, 2002, and known occluded left carotid   Ventricular tachycardia    a. Admitted with VT 05/2013 - EPS with inducible sustained monomorphic VT; s/p Medtronic ICD 05/21/14.    Past Surgical History:  Procedure  Laterality Date   BACK SURGERY     CARDIOVERSION N/A 01/04/2022   Procedure: CARDIOVERSION;  Surgeon: Parke Poisson, MD;  Location: [facility] ENDOSCOPY;  Service: Cardiovascular;  Laterality: N/A;   CAROTID ENDARTERECTOMY Right 01   carotidectomy  2003   right side   COLONOSCOPY  07/2010   CORONARY ARTERY BYPASS GRAFT  02/2009   3 vessel   ELECTROPHYSIOLOGIC STUDY  05/21/14   ELECTROPHYSIOLOGY STUDY N/A 05/21/2014   Procedure: ELECTROPHYSIOLOGY STUDY;  Surgeon: Duke Salvia, MD;  Location: [facility] CATH LAB;  Service: Cardiovascular;  Laterality: N/A;   EP IMPLANTABLE DEVICE  05/21/14   single chamber Metronic ICD   IR THORACENTESIS ASP PLEURAL SPACE W/IMG GUIDE  12/07/2021   LEFT HEART CATHETERIZATION WITH CORONARY/GRAFT ANGIOGRAM N/A 05/19/2014   Procedure: LEFT HEART CATHETERIZATION WITH Isabel Caprice;  Surgeon: Wendall Stade, MD;  Location: [facility] CATH LAB;  Service: Cardiovascular;  Laterality: N/A;   PLEURAL BIOPSY N/A 11/27/2014   Procedure: PLEURAL BIOPSY;  Surgeon: [name], MD;  Location: [facility] OR;  Service: Thoracic;  Laterality: N/A;   PLEURAL EFFUSION DRAINAGE Right 11/27/2014   Procedure: DRAINAGE OF PLEURAL EFFUSION;  Surgeon: [name], MD;  Location: [facility] OR;  Service: Thoracic;  Laterality: Right;   TALC PLEURODESIS Right 11/27/2014   Procedure: Lurlean Nanny;  Surgeon: [name], MD;  Location: [facility] OR;  Service: Thoracic;  Laterality: Right;   VIDEO ASSISTED THORACOSCOPY Right 11/27/2014   Procedure: RIGHT VIDEO ASSISTED THORACOSCOPY;  Surgeon: [name], MD;  Location: [facility] OR;  Service: Thoracic;  Laterality: Right;    Current Outpatient Medications  Medication Sig Dispense Refill   albuterol (VENTOLIN HFA) 108 (90 Base) MCG/ACT inhaler INHALE 2 PUFFS BY MOUTH EVERY 6 HOURS AS NEEDED FOR WHEEZING FOR SHORTNESS OF BREATH 18 g 11   apixaban (ELIQUIS) 5 MG TABS tablet Take 1 tablet (5 mg total) by mouth 2 (two) times daily. 60 tablet  6   atorvastatin (LIPITOR) 80 MG tablet TAKE 1 TABLET BY MOUTH ONCE DAILY AT  6PM 90 tablet 0   carvedilol (COREG) 25 MG tablet Take 1 tablet (25 mg total) by mouth 2 (two) times daily. Please call [phone] to schedule an appointment for June for future refills. Thank you. 180 tablet 0   empagliflozin (JARDIANCE) 10 MG TABS tablet Take 1 tablet (10 mg total) by mouth daily before breakfast. 30 tablet 11   ezetimibe (ZETIA) 10 MG tablet Take 1 tablet (10 mg total) by mouth daily. 90 tablet 3   furosemide (LASIX) 20 MG tablet Take 1 tablet (20 mg total) by mouth 2 (two) times daily. 180 tablet 1   meclizine (ANTIVERT) 25 MG tablet Take 1 tablet (25 mg total) by mouth 3 (three) times daily as needed for dizziness. 30 tablet 0   nilotinib (TASIGNA) 150 MG capsule Take 300 mg by mouth every 12 (twelve) hours.     nitroGLYCERIN (NITROSTAT) 0.4  MG SL tablet Place 0.4 mg under the tongue every 5 (five) minutes as needed for chest pain.     valsartan (DIOVAN) 160 MG tablet Take 160 mg by mouth daily. (Patient not taking: Reported on 11/21/2022)     No current facility-administered medications for this visit.    Allergies  Allergen Reactions   Breztri Aerosphere [Budeson-Glycopyrrol-Formoterol] Swelling    Swelling of mouth tongue    Norvasc [Amlodipine Besylate] Rash    rash   Sulfa Antibiotics Hives    Hives     Review of Systems negative except from HPI and PMH  Physical Exam BP 112/64   Pulse 89   Ht 5\' 9"  (1.753 m)   Wt 182 lb 3.2 oz (82.6 kg)   SpO2 97%   BMI 26.91 kg/m  Well developed and well nourished in no acute distress HENT normal Neck supple with JVP10 Clear Device pocket well healed; without hematoma or erythema.  There is no tethering  Irregular rate and rhythm, no murmur Abd-soft with active BS No Clubbing cyanosis  edema Skin-warm and dry A & Oriented  Grossly normal sensory and motor function  ECG atrial flutter @ 87 with variable block and can  Device  function is normal. Programming changes noen  See Paceart for details    ECG *sinus at 77 Intervals 20/14/42 Axis left -62  Repolarization abnormalities are unchanged from 2020.  QRS duration has widened from 122--140 ms  Assessment and  Plan  Ventricular tachycardia-non sustained  Exertional chest discomfort  Dyspnea on exertion  Syncope - no recurrent   ischemic cardiomyopathy depressed LV function and prior bypass  Implantable defibrillator-Medtronic  Carotid Stenosis  Known L occlusion    CHF-chronic-systolic-exacerbation  CML  TIA  Skips consistent with PVCs  Atrial flutter  No intercurrent ventricular tachycardia  Patient has recurrent symptomatic atrial flutter, previously diagnosed as atrial fibrillation.  Hence, I think the primary strategy should be catheter ablation.  I have discussed with Dr. Leonia Reeves  .  We have reviewed risks and benefits including but not limited to death stroke bleeding issues as well as heart block might require upgrade of his single-chamber defibrillator.  This will be about a month out given his symptoms, and the fact that he has missed no anticoagulation, we will undertake cardioversion, and there is a note needed for slot this week 4/18  There would be no benefit regarding anticoagulation given his prior TIA; however, his symptoms have been really very life limiting and the recurrence of atrial flutter is significantly decreased by catheter ablation  His congestive heart failure is at least in part related to this temporally and he is volume overloaded on examination.  Surprisingly he is able to lie flat so I think he should tolerate the procedure just fine.

## 2022-11-21 NOTE — Progress Notes (Signed)
.      Patient Care Team: Pickard, Warren T, MD as PCP - General (Family Medicine) Thukkani, Arun K, MD as PCP - Cardiology (Cardiology) Edmunds, Rigoberto H., MD as Attending Physician (Cardiology) Pickard, Warren T, MD (Family Medicine) Hendrickson, Lugene Beougher C, MD (Cardiothoracic Surgery) Early, Todd F, MD as Attending Physician (Vascular Surgery) Gorsuch, Ni, MD as Consulting Physician (Hematology and Oncology) Owais Pruett C, MD as Consulting Physician (Cardiology) Davis, Christian L, RPH as Pharmacist (Pharmacist) Davis, Christian L, RPH as Pharmacist (Pharmacist) [facility]   HPI  Anthony Wolfe is a 76 y.o. male Seen in follow-up for an .Medtronic  ICD implanted for secondary prevention after he presented with ventricular tachycardia 10/15. Catheterization demonstrated occluded vein graft to his RCA ejection fraction was about 40%  He is status post bypass surgery 2010  He underwent EP testing and was found to be inducible.  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   He underwent a thoracentesis initially in 11/15 and redo 12/15.  He saw pulm 1/16 with question as to whether immunotherpay could be explanation of effusion;  He had pleurodesis.  4/23 also found to have recurrent pleural effusions.  Laboratories most consistent with transudate  4/23 referred back to cardiology with a diagnosis of "atrial fibrillation "ECG reviewed--typical atrial flutter underwent cardioversion with significant improvement in dyspnea. Now with recurrent flutter associated with very limited shortness of breath.  Bendopnea, cannot crank weedeater, short of breath rolling over in bed.  Surprisingly no orthopnea or recumbent dyspnea.  No peripheral edema.  DATE TEST EF   9/14 Myoview   EF ?? No ischemia  3/171 Carotids R ICA 40-59%//L ICA- T   10/17 Echo  45-50 % LAE 46/2.26/35  3/23 Echo  40-45%         Antiarrhythmics Date Reason stopped  amio  intolerant  [place]  Insurance refusal     Previously on aspirin and Plavix with a history of remote TIA, now on apixaban  Date Cr K Mg Hgb  5/18  1.1 3.7     11/18 0.9 3.9 2.3 11.5  1/20 1.05 4.3  12.8  3/21 0.94 3.9 1.8 11.1  4/24 1.17 4.8  12.1    Thromboembolic risk factors ( age - -2, HTN-1, TIA/CVA-2, Vasc disease -1, CHF-1) for a CHADSVASc Score of >=7 Past Medical History:  Diagnosis Date   AICD (automatic cardioverter/defibrillator) present    Allergic rhinitis    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 difficile colitis 06/16/2014   CAD (coronary artery disease)    a. s/p 3v CABG 2010. b. NSTEMI 05/2014 in setting of VT. Cath impressions: "Recent IMI with occluded SVG to RCA. Has Right to Right collaterals and collaterals from septal and OM. EF 40%."   Carotid disease, bilateral    a. Right CEA 2002; known occluded left carotid. b. Duplex 06/2014: known occluded LICA, stable 1-39% RICA s/p CEA.   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 Prior EF 36%. b. 2014: 50%. c. 05/2014: EF 35-40% by echo, 40% with inf HK by cath.   NSTEMI (non-ST elevation myocardial infarction) 05/2014   Pleural effusion 05/2014   Pneumonia   05/23/2014   Presence of permanent cardiac pacemaker    Prostate hypertrophy    PVC's (premature ventricular contractions)    a. PVC's with prior Holter showing PVC load of 22%.   Shortness of breath dyspnea    Stroke 01   TIA (transient ischemic attack)    ASPVD, S./P. right CEA, 2002, and known occluded left carotid   Ventricular tachycardia    a. Admitted with VT 05/2013 - EPS with inducible sustained monomorphic VT; s/p Medtronic ICD 05/21/14.    Past Surgical History:  Procedure  Laterality Date   BACK SURGERY     CARDIOVERSION N/A 01/04/2022   Procedure: CARDIOVERSION;  Surgeon: Parke Poisson, MD;  Location: [facility] ENDOSCOPY;  Service: Cardiovascular;  Laterality: N/A;   CAROTID ENDARTERECTOMY Right 01   carotidectomy  2003   right side   COLONOSCOPY  07/2010   CORONARY ARTERY BYPASS GRAFT  02/2009   3 vessel   ELECTROPHYSIOLOGIC STUDY  05/21/14   ELECTROPHYSIOLOGY STUDY N/A 05/21/2014   Procedure: ELECTROPHYSIOLOGY STUDY;  Surgeon: Duke Salvia, MD;  Location: [facility] CATH LAB;  Service: Cardiovascular;  Laterality: N/A;   EP IMPLANTABLE DEVICE  05/21/14   single chamber Metronic ICD   IR THORACENTESIS ASP PLEURAL SPACE W/IMG GUIDE  12/07/2021   LEFT HEART CATHETERIZATION WITH CORONARY/GRAFT ANGIOGRAM N/A 05/19/2014   Procedure: LEFT HEART CATHETERIZATION WITH Isabel Caprice;  Surgeon: Wendall Stade, MD;  Location: [facility] CATH LAB;  Service: Cardiovascular;  Laterality: N/A;   PLEURAL BIOPSY N/A 11/27/2014   Procedure: PLEURAL BIOPSY;  Surgeon: [name], MD;  Location: [facility] OR;  Service: Thoracic;  Laterality: N/A;   PLEURAL EFFUSION DRAINAGE Right 11/27/2014   Procedure: DRAINAGE OF PLEURAL EFFUSION;  Surgeon: [name], MD;  Location: [facility] OR;  Service: Thoracic;  Laterality: Right;   TALC PLEURODESIS Right 11/27/2014   Procedure: Lurlean Nanny;  Surgeon: [name], MD;  Location: [facility] OR;  Service: Thoracic;  Laterality: Right;   VIDEO ASSISTED THORACOSCOPY Right 11/27/2014   Procedure: RIGHT VIDEO ASSISTED THORACOSCOPY;  Surgeon: [name], MD;  Location: [facility] OR;  Service: Thoracic;  Laterality: Right;    Current Outpatient Medications  Medication Sig Dispense Refill   albuterol (VENTOLIN HFA) 108 (90 Base) MCG/ACT inhaler INHALE 2 PUFFS BY MOUTH EVERY 6 HOURS AS NEEDED FOR WHEEZING FOR SHORTNESS OF BREATH 18 g 11   apixaban (ELIQUIS) 5 MG TABS tablet Take 1 tablet (5 mg total) by mouth 2 (two) times daily. 60 tablet  6   atorvastatin (LIPITOR) 80 MG tablet TAKE 1 TABLET BY MOUTH ONCE DAILY AT  6PM 90 tablet 0   carvedilol (COREG) 25 MG tablet Take 1 tablet (25 mg total) by mouth 2 (two) times daily. Please call [phone] to schedule an appointment for June for future refills. Thank you. 180 tablet 0   empagliflozin (JARDIANCE) 10 MG TABS tablet Take 1 tablet (10 mg total) by mouth daily before breakfast. 30 tablet 11   ezetimibe (ZETIA) 10 MG tablet Take 1 tablet (10 mg total) by mouth daily. 90 tablet 3   furosemide (LASIX) 20 MG tablet Take 1 tablet (20 mg total) by mouth 2 (two) times daily. 180 tablet 1   meclizine (ANTIVERT) 25 MG tablet Take 1 tablet (25 mg total) by mouth 3 (three) times daily as needed for dizziness. 30 tablet 0   nilotinib (TASIGNA) 150 MG capsule Take 300 mg by mouth every 12 (twelve) hours.     nitroGLYCERIN (NITROSTAT) 0.4  MG SL tablet Place 0.4 mg under the tongue every 5 (five) minutes as needed for chest pain.     valsartan (DIOVAN) 160 MG tablet Take 160 mg by mouth daily. (Patient not taking: Reported on 11/21/2022)     No current facility-administered medications for this visit.    Allergies  Allergen Reactions   Breztri Aerosphere [Budeson-Glycopyrrol-Formoterol] Swelling    Swelling of mouth tongue    Norvasc [Amlodipine Besylate] Rash    rash   Sulfa Antibiotics Hives    Hives     Review of Systems negative except from HPI and PMH  Physical Exam BP 112/64   Pulse 89   Ht 5\' 9"  (1.753 m)   Wt 182 lb 3.2 oz (82.6 kg)   SpO2 97%   BMI 26.91 kg/m  Well developed and well nourished in no acute distress HENT normal Neck supple with JVP10 Clear Device pocket well healed; without hematoma or erythema.  There is no tethering  Irregular rate and rhythm, no murmur Abd-soft with active BS No Clubbing cyanosis  edema Skin-warm and dry A & Oriented  Grossly normal sensory and motor function  ECG atrial flutter @ 87 with variable block and can  Device  function is normal. Programming changes noen  See Paceart for details    ECG *sinus at 77 Intervals 20/14/42 Axis left -62  Repolarization abnormalities are unchanged from 2020.  QRS duration has widened from 122--140 ms  Assessment and  Plan  Ventricular tachycardia-non sustained  Exertional chest discomfort  Dyspnea on exertion  Syncope - no recurrent   ischemic cardiomyopathy depressed LV function and prior bypass  Implantable defibrillator-Medtronic  Carotid Stenosis  Known L occlusion    CHF-chronic-systolic-exacerbation  CML  TIA  Skips consistent with PVCs  Atrial flutter  No intercurrent ventricular tachycardia  Patient has recurrent symptomatic atrial flutter, previously diagnosed as atrial fibrillation.  Hence, I think the primary strategy should be catheter ablation.  I have discussed with Dr. Leonia Reeves  .  We have reviewed risks and benefits including but not limited to death stroke bleeding issues as well as heart block might require upgrade of his single-chamber defibrillator.  This will be about a month out given his symptoms, and the fact that he has missed no anticoagulation, we will undertake cardioversion, and there is a note needed for slot this week 4/18  There would be no benefit regarding anticoagulation given his prior TIA; however, his symptoms have been really very life limiting and the recurrence of atrial flutter is significantly decreased by catheter ablation  His congestive heart failure is at least in part related to this temporally and he is volume overloaded on examination.  Surprisingly he is able to lie flat so I think he should tolerate the procedure just fine.

## 2022-11-23 LAB — BCR/ABL

## 2022-11-23 NOTE — Pre-Procedure Instructions (Signed)
Instructed patient on following items: Arrival time 1100 NPO after MN... Bp and anticoagulation meds ok with sips of water AM of procedure Responsible party to stay home after procedure and for 24hrs Have you missed any does of anticoagulant?Pt states that he has not missed any does of  eliquis, informed to take medication prior to procedure.

## 2022-11-24 ENCOUNTER — Ambulatory Visit (HOSPITAL_BASED_OUTPATIENT_CLINIC_OR_DEPARTMENT_OTHER): Payer: Medicare HMO | Admitting: Anesthesiology

## 2022-11-24 ENCOUNTER — Encounter (HOSPITAL_COMMUNITY): Payer: Self-pay | Admitting: Cardiology

## 2022-11-24 ENCOUNTER — Encounter (HOSPITAL_COMMUNITY)
Admission: RE | Disposition: A | Discharge status: Home / Self Care | Payer: Self-pay | Source: Home / Self Care | Attending: Cardiology

## 2022-11-24 ENCOUNTER — Inpatient Hospital Stay: Payer: Medicare HMO | Admitting: Hematology and Oncology

## 2022-11-24 ENCOUNTER — Telehealth: Payer: Self-pay

## 2022-11-24 ENCOUNTER — Ambulatory Visit (HOSPITAL_COMMUNITY)
Admission: RE | Admit: 2022-11-24 | Discharge: 2022-11-24 | Disposition: A | Discharge status: Home / Self Care | Payer: Medicare HMO | Attending: Cardiology | Admitting: Cardiology

## 2022-11-24 ENCOUNTER — Ambulatory Visit (HOSPITAL_COMMUNITY): Payer: Medicare HMO | Admitting: Anesthesiology

## 2022-11-24 DIAGNOSIS — Z8673 Personal history of transient ischemic attack (TIA), and cerebral infarction without residual deficits: Secondary | ICD-10-CM | POA: Diagnosis not present

## 2022-11-24 DIAGNOSIS — Z95810 Presence of automatic (implantable) cardiac defibrillator: Secondary | ICD-10-CM | POA: Insufficient documentation

## 2022-11-24 DIAGNOSIS — I110 Hypertensive heart disease with heart failure: Secondary | ICD-10-CM | POA: Diagnosis not present

## 2022-11-24 DIAGNOSIS — Z87891 Personal history of nicotine dependence: Secondary | ICD-10-CM | POA: Diagnosis not present

## 2022-11-24 DIAGNOSIS — I483 Typical atrial flutter: Secondary | ICD-10-CM

## 2022-11-24 DIAGNOSIS — I509 Heart failure, unspecified: Secondary | ICD-10-CM

## 2022-11-24 DIAGNOSIS — I5022 Chronic systolic (congestive) heart failure: Secondary | ICD-10-CM | POA: Diagnosis not present

## 2022-11-24 DIAGNOSIS — I6522 Occlusion and stenosis of left carotid artery: Secondary | ICD-10-CM | POA: Diagnosis not present

## 2022-11-24 DIAGNOSIS — Z7901 Long term (current) use of anticoagulants: Secondary | ICD-10-CM | POA: Insufficient documentation

## 2022-11-24 DIAGNOSIS — I252 Old myocardial infarction: Secondary | ICD-10-CM | POA: Diagnosis not present

## 2022-11-24 DIAGNOSIS — I472 Ventricular tachycardia, unspecified: Secondary | ICD-10-CM | POA: Insufficient documentation

## 2022-11-24 DIAGNOSIS — I255 Ischemic cardiomyopathy: Secondary | ICD-10-CM | POA: Insufficient documentation

## 2022-11-24 DIAGNOSIS — Z951 Presence of aortocoronary bypass graft: Secondary | ICD-10-CM | POA: Diagnosis not present

## 2022-11-24 DIAGNOSIS — I4891 Unspecified atrial fibrillation: Secondary | ICD-10-CM | POA: Insufficient documentation

## 2022-11-24 DIAGNOSIS — I2510 Atherosclerotic heart disease of native coronary artery without angina pectoris: Secondary | ICD-10-CM | POA: Diagnosis not present

## 2022-11-24 HISTORY — DX: Coagulation defect, unspecified: D68.9

## 2022-11-24 HISTORY — PX: CARDIOVERSION: SHX1299

## 2022-11-24 SURGERY — CARDIOVERSION
Anesthesia: General

## 2022-11-24 MED ORDER — SODIUM CHLORIDE 0.9 % IV SOLN: INTRAVENOUS | Status: DC | PRN

## 2022-11-24 MED ORDER — EPHEDRINE SULFATE (PRESSORS) 50 MG/ML IJ SOLN
INTRAMUSCULAR | Status: DC | PRN
  Administered 2022-11-24: 15 mg via INTRAVENOUS

## 2022-11-24 MED ORDER — PROPOFOL 10 MG/ML IV BOLUS
INTRAVENOUS | Status: DC | PRN
  Administered 2022-11-24: 80 mg via INTRAVENOUS

## 2022-11-24 MED ORDER — SODIUM CHLORIDE 0.9 % IV SOLN: INTRAVENOUS | Status: DC

## 2022-11-24 SURGICAL SUPPLY — 1 items: ELECT DEFIB PAD ADLT CADENCE (PAD) ×1 IMPLANT

## 2022-11-24 NOTE — Telephone Encounter (Signed)
Patient getting procedure today left appt as scheduled.

## 2022-11-24 NOTE — Discharge Instructions (Signed)

## 2022-11-24 NOTE — Anesthesia Preprocedure Evaluation (Addendum)
Anesthesia Evaluation  Patient identified by MRN, date of birth, ID band Patient awake    Reviewed: Allergy & Precautions, NPO status , Patient's Chart, lab work & pertinent test results, reviewed documented beta blocker date and time   History of Anesthesia Complications Negative for: history of anesthetic complications  Airway Mallampati: I  TM Distance: >3 FB Neck ROM: Full    Dental  (+) Edentulous Upper, Edentulous Lower, Upper Dentures, Lower Dentures   Pulmonary former smoker   breath sounds clear to auscultation       Cardiovascular hypertension, Pt. on medications and Pt. on home beta blockers (-) angina + CAD, + Past MI, + CABG, + Peripheral Vascular Disease and +CHF  + dysrhythmias Ventricular Tachycardia + pacemaker + Cardiac Defibrillator  Rhythm:Irregular Rate:Normal  '23 ECHO: EF 40-45%. LV EF 42 %. The LV has mildly decreased function. There is moderate LVH, Grade I diastolic dysfunction (impaired relaxation). There is severe akinesis of the left ventricular, basal-mid inferoseptal wall and inferior wall. RVF is low normal. Mild MR    Neuro/Psych   Anxiety Depression    TIACVA    GI/Hepatic negative GI ROS, Neg liver ROS,,,  Endo/Other  negative endocrine ROS    Renal/GU negative Renal ROS     Musculoskeletal   Abdominal   Peds  Hematology  (+) Blood dyscrasia (CML) eliquis   Anesthesia Other Findings   Reproductive/Obstetrics                              Anesthesia Physical Anesthesia Plan  ASA: 4  Anesthesia Plan: General   Post-op Pain Management: Minimal or no pain anticipated   Induction:   PONV Risk Score and Plan: 2 and Treatment may vary due to age or medical condition  Airway Management Planned: Natural Airway and Mask  Additional Equipment: None  Intra-op Plan:   Post-operative Plan:   Informed Consent: I have reviewed the patients History and  Physical, chart, labs and discussed the procedure including the risks, benefits and alternatives for the proposed anesthesia with the patient or authorized representative who has indicated his/her understanding and acceptance.     Dental advisory given  Plan Discussed with: CRNA and Surgeon  Anesthesia Plan Comments:          Anesthesia Quick Evaluation

## 2022-11-24 NOTE — Telephone Encounter (Signed)
-----   Message from [name], MD sent at 11/24/2022  8:44 AM EDT ----- His results are available. Ask if he wants appt sooner tomorrow

## 2022-11-24 NOTE — Interval H&P Note (Signed)
History and Physical Interval Note:  11/24/2022 11:00 AM  Anthony Wolfe  has presented today for surgery, with the diagnosis of AFLUTTER.  The various methods of treatment have been discussed with the patient and family. After consideration of risks, benefits and other options for treatment, the patient has consented to  Procedure(s): CARDIOVERSION (N/A) as a surgical intervention.  The patient's history has been reviewed, patient examined, no change in status, stable for surgery.  I have reviewed the patient's chart and labs.  Questions were answered to the patient's satisfaction.     Merril Isakson

## 2022-11-24 NOTE — CV Procedure (Signed)
   Electrical Cardioversion Procedure Note Anthony Wolfe [id number] 10-Dec-1945  Procedure: Electrical Cardioversion Indications:  Atrial Fibrillation  Time Out: Verified patient identification, verified procedure,medications/allergies/relevent history reviewed, required imaging and test results available.  Performed  Procedure Details  The patient signed informed consent.   The patient was NPO past midnight. Has had therapeutic anticoagulation with Eliquis greater than 3 weeks. The patient denies any interruption of anticoagulation.  Prior to his procedure his ICD was interrogated.  Anesthesia was administered by Dr. Jean Wolfe.  Adequate airway was maintained throughout and vital followed per protocol.  He was cardioverted x 1 with 200 J of biphasic synchronized energy.  He converted to NSR.  There were no apparent complications.  The patient tolerated the procedure well and had normal neuro status and respiratory status post procedure with vitals stable as recorded elsewhere.     IMPRESSION:  Successful cardioversion of atrial fibrillation   Follow up:  We will arrange follow up with primary cardiologist.  He will continue on current medical therapy.  The patient advised to continue anticoagulation.  Anthony Wolfe 11/24/2022, 11:57 AM

## 2022-11-24 NOTE — Anesthesia Postprocedure Evaluation (Signed)
Anesthesia Post Note  Patient: Anthony Wolfe  Procedure(s) Performed: CARDIOVERSION     Patient location during evaluation: Cath Lab Anesthesia Type: General Level of consciousness: awake and alert, patient cooperative and oriented Pain management: pain level controlled Vital Signs Assessment: post-procedure vital signs reviewed and stable Respiratory status: spontaneous breathing, nonlabored ventilation and respiratory function stable Cardiovascular status: blood pressure returned to baseline and stable Postop Assessment: no apparent nausea or vomiting and able to ambulate Anesthetic complications: no   No notable events documented.  Last Vitals:  Vitals:   11/24/22 1215 11/24/22 1225  BP: (!) 111/57 116/67  Pulse: 71 70  Resp: (!) 22 19  Temp:    SpO2: 96% 95%    Last Pain:  Vitals:   11/24/22 1225  TempSrc:   PainSc: 0-No pain                 Geriann Lafont,E. Laketa Sandoz

## 2022-11-24 NOTE — Transfer of Care (Signed)
Immediate Anesthesia Transfer of Care Note  Patient: Anthony Wolfe  Procedure(s) Performed: CARDIOVERSION  Patient Location: Cath Lab  Anesthesia Type:MAC  Level of Consciousness: awake, alert , and oriented  Airway & Oxygen Therapy: Patient Spontanous Breathing  Post-op Assessment: Report given to RN and Post -op Vital signs reviewed and stable  Post vital signs: Reviewed and stable  Last Vitals:  Vitals Value Taken Time  BP 120/64   Temp 98   Pulse 73   Resp 16   SpO2 95     Last Pain: There were no vitals filed for this visit.       Complications: No notable events documented.

## 2022-11-28 ENCOUNTER — Ambulatory Visit (INDEPENDENT_AMBULATORY_CARE_PROVIDER_SITE_OTHER): Payer: Medicare HMO

## 2022-11-28 ENCOUNTER — Other Ambulatory Visit: Payer: Self-pay

## 2022-11-28 DIAGNOSIS — I472 Ventricular tachycardia, unspecified: Secondary | ICD-10-CM | POA: Diagnosis not present

## 2022-11-28 MED ORDER — NILOTINIB HCL 150 MG PO CAPS: 150.0000 mg | ORAL_CAPSULE | Freq: Two times a day (BID) | ORAL | 11 refills | Status: DC

## 2022-11-29 LAB — CUP PACEART REMOTE DEVICE CHECK
Battery Remaining Longevity: 28 mo
Battery Voltage: 2.95 V
Brady Statistic RV Percent Paced: 0.03 %
Date Time Interrogation Session: 20240422043722
HighPow Impedance: 49 Ohm
Implantable Lead Connection Status: 753985
Implantable Lead Implant Date: 20151014
Implantable Lead Location: 753860
Implantable Pulse Generator Implant Date: 20151014
Lead Channel Impedance Value: 399 Ohm
Lead Channel Impedance Value: 418 Ohm
Lead Channel Pacing Threshold Amplitude: 0.75 V
Lead Channel Pacing Threshold Pulse Width: 0.4 ms
Lead Channel Sensing Intrinsic Amplitude: 4.375 mV
Lead Channel Sensing Intrinsic Amplitude: 4.375 mV
Lead Channel Setting Pacing Amplitude: 2 V
Lead Channel Setting Pacing Pulse Width: 0.4 ms
Lead Channel Setting Sensing Sensitivity: 0.3 mV
Zone Setting Status: 755011

## 2022-11-30 ENCOUNTER — Other Ambulatory Visit: Payer: Self-pay

## 2022-11-30 MED ORDER — NILOTINIB HCL 50 MG PO CAPS: 100.0000 mg | ORAL_CAPSULE | Freq: Two times a day (BID) | ORAL | 0 refills | Status: DC

## 2022-11-30 NOTE — Progress Notes (Signed)
Received fax request for Tasigna  cap refill-take two capsules ( ) by mouth every 12 hrs. Upon investigation, Tasigna  cap ordered on 11/28/22 but never sent to a pharmacy. MD confirmed that  BID is correct dosage. Pt confirmed [facility] pharmacy. Tasigna  discontinued and Tasigna  cap-take 2 capsules by mouth every 12 hrs ordered.

## 2022-12-06 ENCOUNTER — Other Ambulatory Visit: Payer: Self-pay

## 2022-12-06 ENCOUNTER — Inpatient Hospital Stay: Payer: Medicare HMO | Admitting: Hematology and Oncology

## 2022-12-06 ENCOUNTER — Encounter: Payer: Self-pay | Admitting: Hematology and Oncology

## 2022-12-06 ENCOUNTER — Telehealth: Payer: Self-pay

## 2022-12-06 VITALS — BP 144/60 | HR 77 | Temp 97.4°F | Resp 18 | Ht 67.0 in | Wt 187.8 lb

## 2022-12-06 DIAGNOSIS — I483 Typical atrial flutter: Secondary | ICD-10-CM

## 2022-12-06 DIAGNOSIS — C9210 Chronic myeloid leukemia, BCR/ABL-positive, not having achieved remission: Secondary | ICD-10-CM | POA: Diagnosis not present

## 2022-12-06 DIAGNOSIS — I4891 Unspecified atrial fibrillation: Secondary | ICD-10-CM | POA: Diagnosis not present

## 2022-12-06 NOTE — Progress Notes (Signed)
[facility] OFFICE PROGRESS NOTE  Patient Care Team: [name], MD as PCP - General (Family Medicine) [name], MD as PCP - Cardiology (Cardiology) [name], MD as Attending Physician (Cardiology) [name], MD (Family Medicine) [name], MD (Cardiothoracic Surgery) Early, [name], MD as Attending Physician (Vascular Surgery) [name], MD as Consulting Physician (Hematology and Oncology) Duke Salvia, MD as Consulting Physician (Cardiology) Erroll Luna, [facility] as Pharmacist (Pharmacist) Erroll Luna, [facility] as Pharmacist (Pharmacist) [facility]  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   All questions were answered. The patient knows to call the clinic with any problems, questions or concerns. The total time spent in the appointment was 20 minutes encounter with patients including review of chart and various tests results, discussions about plan of care and coordination of care plan   [name], MD 12/06/2022 10:49 AM  INTERVAL HISTORY: Please see below for problem oriented charting. he returns for treatment follow-up on Tasigna for CML He tolerated treatment well Denies missing doses No recent shortness of breath or fluid retention  REVIEW OF SYSTEMS:   Constitutional: Denies fevers, chills or abnormal weight loss Eyes: Denies blurriness of vision Ears, nose, mouth, throat, and face: Denies mucositis or sore throat Respiratory: Denies cough, dyspnea or  wheezes Cardiovascular: Denies palpitation, chest discomfort or lower extremity swelling Gastrointestinal:  Denies nausea, heartburn or change in bowel habits Skin: Denies abnormal skin rashes Lymphatics: Denies new lymphadenopathy or easy bruising Neurological:Denies numbness, tingling or new weaknesses Behavioral/Psych: Mood is stable, no new changes  All other systems were reviewed with the patient and are negative.  I have reviewed the past medical history, past surgical history, social history and family history with the patient and they are unchanged from previous note.  ALLERGIES:  is allergic to breztri aerosphere [budeson-glycopyrrol-formoterol], norvasc [amlodipine besylate], and sulfa antibiotics.  MEDICATIONS:  Current Outpatient Medications  Medication Sig Dispense Refill   albuterol (VENTOLIN HFA) 108 (90 Base) MCG/ACT inhaler INHALE 2 PUFFS BY MOUTH EVERY 6 HOURS AS NEEDED FOR WHEEZING FOR SHORTNESS OF BREATH 18 g 11   apixaban (ELIQUIS) 5 MG TABS tablet Take 1 tablet (5 mg total) by mouth 2 (two) times daily. 60 tablet 6   atorvastatin (LIPITOR) 80 MG tablet TAKE 1 TABLET BY MOUTH ONCE DAILY AT  6PM 90 tablet 0   carvedilol (COREG) 25 MG tablet Take 1 tablet (25 mg total) by mouth 2 (two) times daily. Please call [phone] to schedule an appointment for June for future refills. Thank you. 180 tablet 0   empagliflozin (JARDIANCE) 10 MG TABS tablet Take 1 tablet (10 mg total) by mouth daily before breakfast. 30 tablet 11   ezetimibe (ZETIA) 10 MG tablet Take 1 tablet (10 mg total) by mouth daily. 90 tablet 3   furosemide (LASIX) 20 MG tablet Take 1 tablet (20 mg total) by mouth 2 (two) times daily. 180 tablet 1   meclizine (ANTIVERT) 25 MG tablet Take 1 tablet (25 mg total) by mouth 3 (  three) times daily as needed for dizziness. 30 tablet 0   nilotinib (TASIGNA) 50 MG capsule Take 2 capsules (100 mg total) by mouth every 12 (twelve) hours. Take on empty stomach, at least one  hour before or two hours after food. 360 capsule 0   nitroGLYCERIN (NITROSTAT) 0.4 MG SL tablet Place 0.4 mg under the tongue every 5 (five) minutes as needed for chest pain.     No current facility-administered medications for this visit.    SUMMARY OF ONCOLOGIC HISTORY: Oncology History  Chronic myelogenous leukemia (HCC)  05/04/2012 Bone Marrow Biopsy   BM confirmed diagnosis of CML in Chronic phase. BCR/ABL by PCR detected abnormalitis with b2a2 & b3a2 subtypes   05/09/2012 - 03/16/2015 Chemotherapy   He was started on treatment with Dasatinib 100 mg daily   06/06/2012 Adverse Reaction   Dose of medication was reduced to 50 mg daily due to pancytopenia   01/24/2013 Progression   Patient was noted to have elevated blood count which and was subsequently found to be noncompliant to treatment. The patient has not been on treatment for several months because his prescription ran out. He was restarted back on treatment   01/16/2014 Progression   Bcr/ABL by PCR is worse. Dose of Dasatinib was increased to 100 mg.   02/26/2014 Tumor Marker   Blood work for ABL kinase mutation was negative.   10/29/2014 Tumor Marker   BCR/ABL b2a2 & b3a2 0.29%, IS 0.1624%, not in MMR yet but improving   11/05/2014 Adverse Reaction   He had thoracentesis due to pleural effusion   11/27/2014 Surgery   He underwent right video-assisted thoracoscopy, Drainage of pleural effusion, Pleural biopsy, Diaphragm biopsy, Lung biopsy & Talc pleurodesis   01/30/2015 Tumor Marker   BCR/ABL b2a2 & b3a2 0.78%, IS 0.4368%, not in MMR    03/11/2015 Pathology Results   BCR/ABL b2a2 0.19%, IS 0.1064%, not in MMR    03/16/2015 Adverse Reaction   Dasatinib was stopped due to recurrent pleural effusion   03/28/2015 - 11/06/2017 Chemotherapy   He started on Bosutinib   03/30/2015 Procedure   He has therapeutic thoracentesis of the right lung with 1 liter of fluid removed   03/30/2015 Adverse Reaction   Bosutinib is placed on hold,  to be restart on 8/25 at 250 mg due to severe diarrhea   04/28/2015 Tumor Marker   BCR/ABL b2a2 0.06%, IS 0.0336%, in MMR    06/10/2015 Tumor Marker   BCR/ABL b2a2 0.14%, IS 0.1162%, not in MMR    08/14/2015 Pathology Results   BCR/ABL b2a2 0.02%, IS 0.0166%, In MMR    11/09/2015 Tumor Marker   BCR/ABL b2a2 0.005%, IS 0.0042%, In MMR    02/08/2016 Tumor Marker   BCR/ABL undetectable. In MMR   06/13/2016 Tumor Marker   BCR/ABL undetectable. In MMR   12/06/2016 Pathology Results   BCR/ABL undetectable. In MMR   06/08/2017 Pathology Results   BCR/ABL undetectable. In MMR   09/08/2017 Pathology Results   BCR/ABL undetectable. In MMR   12/11/2017 Pathology Results   BCR/ABL undetectable. In MMR   03/19/2018 Pathology Results   BCR/ABL undetectable. In MMR   06/12/2018 Pathology Results   BCR/ABL undetectable. In MMR   09/06/2018 Pathology Results   BCR ABL is detectable. e13a2 (b2a2) and [id number]) IS: 0.009%   09/26/2018 -  Chemotherapy   The patient had Tasigna    11/21/2018 Pathology Results   BCR/ABL undetectable. In MMR   03/07/2019 Pathology Results   BCR/ABL undetectable. In  MMR   06/17/2019 Pathology Results   BCR/ABL undetectable. In MMR   10/14/2019 Pathology Results   BCR ABL is detectable. e13a2 (b2a2) and W09W1(X9J4) IS: 0.0281%. In MMR   01/14/2020 Pathology Results   BCR ABL is detectable. e13a2 (b2a2) and N82N5(A2Z3) IS: 0.023%. In MMR   10/23/2020 Pathology Results   BCR ABL is detectable. e13a2 (b2a2) and Y86V7(Q4O9) IS: 0.0643%. In MMR   01/22/2021 Pathology Results   BCR ABL is detectable. e13a2 (b2a2) and G29B2(W4X3) IS: 0.027%. In MMR   05/25/2021 Pathology Results   BCR ABL is detectable. e13a2 (b2a2) and K44W1(U2V2) IS: 0.0091%. In MMR   08/25/2021 Pathology Results   BCR ABL is detectable. e13a2 (b2a2) and Z36U4(Q0H4) IS: 0.0039%. In MMR   12/13/2021 Pathology Results   BCR/ABL undetectable. In MMR   03/15/2022 Pathology Results   BCR/ABL undetectable.  In MMR   05/16/2022 Pathology Results   BCR/ABL undetectable. In MMR     08/17/2022 Pathology Results   BCR/ABL undetectable. In MMR    11/14/2022 Pathology Results   BCR/ABL undetectable. In MMR        PHYSICAL EXAMINATION: ECOG PERFORMANCE STATUS: 0 - Asymptomatic  Vitals:   12/06/22 0912  BP: (!) 144/60  Pulse: 77  Resp: 18  Temp: (!) 97.4 F (36.3 C)  SpO2: 100%   Filed Weights   12/06/22 0912  Weight: 187 lb 12.8 oz (85.2 kg)    GENERAL:alert, no distress and comfortable  NEURO: alert & oriented x 3 with fluent speech, no focal motor/sensory deficits  LABORATORY DATA:  I have reviewed the data as listed    Component Value Date/Time   NA 141 11/14/2022 0834   NA 142 11/02/2022 1403   NA 142 06/08/2017 0832   K 4.8 11/14/2022 0834   K 3.9 06/08/2017 0832   CL 111 11/14/2022 0834   CL 110 (H) 11/14/2012 0918   CO2 25 11/14/2022 0834   CO2 25 06/08/2017 0832   GLUCOSE 101 (H) 11/14/2022 0834   GLUCOSE 98 06/08/2017 0832   GLUCOSE 108 (H) 11/14/2012 0918   BUN 16 11/14/2022 0834   BUN 21 11/02/2022 1403   BUN 11.6 06/08/2017 0832   CREATININE 1.17 11/14/2022 0834   CREATININE 1.13 01/22/2021 0840   CREATININE 0.90 05/31/2019 0837   CREATININE 0.9 06/08/2017 0832   CALCIUM 9.1 11/14/2022 0834   CALCIUM 8.6 06/08/2017 0832   PROT 7.0 11/14/2022 0834   PROT 6.5 11/02/2022 1403   PROT 6.7 06/08/2017 0832   ALBUMIN 3.9 11/14/2022 0834   ALBUMIN 3.7 (L) 11/02/2022 1403   ALBUMIN 3.4 (L) 06/08/2017 0832   AST 11 (L) 11/14/2022 0834   AST 13 (L) 01/22/2021 0840   AST 13 06/08/2017 0832   ALT 12 11/14/2022 0834   ALT 14 01/22/2021 0840   ALT 7 06/08/2017 0832   ALKPHOS 88 11/14/2022 0834   ALKPHOS 96 06/08/2017 0832   BILITOT 0.6 11/14/2022 0834   BILITOT 0.6 11/02/2022 1403   BILITOT 0.3 01/22/2021 0840   BILITOT 0.44 06/08/2017 0832   GFRNONAA >60 11/14/2022 0834   GFRNONAA >60 01/22/2021 0840   GFRNONAA 83 05/31/2019 0837   GFRAA >60 04/24/2020  0842   GFRAA 96 05/31/2019 0837    No results found for: "SPEP", "UPEP"  Lab Results  Component Value Date   WBC 7.1 11/14/2022   NEUTROABS 5.2 11/14/2022   HGB 12.1 (L) 11/14/2022   HCT 38.7 (L) 11/14/2022   MCV 96.0 11/14/2022   PLT 194  11/14/2022      Chemistry      Component Value Date/Time   NA 141 11/14/2022 0834   NA 142 11/02/2022 1403   NA 142 06/08/2017 0832   K 4.8 11/14/2022 0834   K 3.9 06/08/2017 0832   CL 111 11/14/2022 0834   CL 110 (H) 11/14/2012 0918   CO2 25 11/14/2022 0834   CO2 25 06/08/2017 0832   BUN 16 11/14/2022 0834   BUN 21 11/02/2022 1403   BUN 11.6 06/08/2017 0832   CREATININE 1.17 11/14/2022 0834   CREATININE 1.13 01/22/2021 0840   CREATININE 0.90 05/31/2019 0837   CREATININE 0.9 06/08/2017 0832      Component Value Date/Time   CALCIUM 9.1 11/14/2022 0834   CALCIUM 8.6 06/08/2017 0832   ALKPHOS 88 11/14/2022 0834   ALKPHOS 96 06/08/2017 0832   AST 11 (L) 11/14/2022 0834   AST 13 (L) 01/22/2021 0840   AST 13 06/08/2017 0832   ALT 12 11/14/2022 0834   ALT 14 01/22/2021 0840   ALT 7 06/08/2017 0832   BILITOT 0.6 11/14/2022 0834   BILITOT 0.6 11/02/2022 1403   BILITOT 0.3 01/22/2021 0840   BILITOT 0.44 06/08/2017 1610

## 2022-12-06 NOTE — Telephone Encounter (Signed)
Pt will have labs done at [facility] the first week of May. He is aware that I will mail his instruction letter to him.

## 2022-12-06 NOTE — Assessment & Plan Note (Signed)
The patient had history of atrial flutter He had multiple procedures done and is going back for another ablation treatment next month I will defer to his cardiologist for management

## 2022-12-06 NOTE — Assessment & Plan Note (Signed)
We have reviewed recent blood work He is still in complete remission with major molecular response For now, he will continue his dose at 100 mg twice daily 

## 2022-12-12 ENCOUNTER — Other Ambulatory Visit (HOSPITAL_COMMUNITY)
Admission: RE | Admit: 2022-12-12 | Discharge: 2022-12-12 | Disposition: A | Discharge status: Home / Self Care | Payer: Medicare HMO | Source: Ambulatory Visit | Attending: Internal Medicine | Admitting: Internal Medicine

## 2022-12-12 ENCOUNTER — Telehealth: Payer: Self-pay

## 2022-12-12 ENCOUNTER — Ambulatory Visit: Payer: Medicare HMO | Admitting: Internal Medicine

## 2022-12-12 DIAGNOSIS — I483 Typical atrial flutter: Secondary | ICD-10-CM | POA: Insufficient documentation

## 2022-12-12 LAB — CBC
HCT: 39 % (ref 39.0–52.0)
Hemoglobin: 12.3 g/dL — ABNORMAL LOW (ref 13.0–17.0)
MCH: 29.9 pg (ref 26.0–34.0)
MCHC: 31.5 g/dL (ref 30.0–36.0)
MCV: 94.9 fL (ref 80.0–100.0)
Platelets: 196 10*3/uL (ref 150–400)
RBC: 4.11 MIL/uL — ABNORMAL LOW (ref 4.22–5.81)
RDW: 15.2 % (ref 11.5–15.5)
WBC: 7 10*3/uL (ref 4.0–10.5)
nRBC: 0 % (ref 0.0–0.2)

## 2022-12-12 NOTE — Telephone Encounter (Signed)
Pt called with questions regarding his upcoming ablation. I went over his instructions and medications and he had no further questions.

## 2023-01-03 ENCOUNTER — Telehealth: Payer: Self-pay

## 2023-01-03 NOTE — Progress Notes (Signed)
Remote ICD transmission.   

## 2023-01-03 NOTE — Telephone Encounter (Signed)
Pt called in to ask pcp to send a refill for this med empagliflozin (JARDIANCE) 10 MG TABS tablet [[id number]] to the assistance program that pt is enrolled in please.  Please call [phone] to get fax number to fax prescription to.  Cb: Mendel Corning at [phone]

## 2023-01-05 NOTE — Pre-Procedure Instructions (Addendum)
Instructed patient on the following items: Arrival time 0730 Nothing to eat or drink after midnight No meds AM of procedure Responsible person to drive you home and stay with you for 24 hrs  Have you missed any doses of anti-coagulant Eliquis- takes twice a day, hasn't missed any doses.  Don't take dose in the morning.  Patient last dose of jardiance was today.  Don't take dose in am.  Will have to check with anesthesia to see if that's ok.

## 2023-01-06 ENCOUNTER — Ambulatory Visit (HOSPITAL_COMMUNITY): Payer: Medicare HMO | Admitting: Anesthesiology

## 2023-01-06 ENCOUNTER — Ambulatory Visit (HOSPITAL_COMMUNITY)
Admission: RE | Admit: 2023-01-06 | Discharge: 2023-01-06 | Disposition: A | Discharge status: Home / Self Care | Payer: Medicare HMO | Attending: Internal Medicine | Admitting: Internal Medicine

## 2023-01-06 ENCOUNTER — Encounter (HOSPITAL_COMMUNITY): Payer: Self-pay | Admitting: Internal Medicine

## 2023-01-06 ENCOUNTER — Ambulatory Visit (HOSPITAL_BASED_OUTPATIENT_CLINIC_OR_DEPARTMENT_OTHER): Payer: Medicare HMO | Admitting: Anesthesiology

## 2023-01-06 ENCOUNTER — Encounter (HOSPITAL_COMMUNITY)
Admission: RE | Disposition: A | Discharge status: Home / Self Care | Payer: Medicare HMO | Source: Home / Self Care | Attending: Internal Medicine

## 2023-01-06 DIAGNOSIS — Z95810 Presence of automatic (implantable) cardiac defibrillator: Secondary | ICD-10-CM | POA: Insufficient documentation

## 2023-01-06 DIAGNOSIS — I4892 Unspecified atrial flutter: Secondary | ICD-10-CM

## 2023-01-06 DIAGNOSIS — I2510 Atherosclerotic heart disease of native coronary artery without angina pectoris: Secondary | ICD-10-CM | POA: Diagnosis not present

## 2023-01-06 DIAGNOSIS — I255 Ischemic cardiomyopathy: Secondary | ICD-10-CM | POA: Diagnosis not present

## 2023-01-06 DIAGNOSIS — C9210 Chronic myeloid leukemia, BCR/ABL-positive, not having achieved remission: Secondary | ICD-10-CM | POA: Diagnosis not present

## 2023-01-06 DIAGNOSIS — I509 Heart failure, unspecified: Secondary | ICD-10-CM

## 2023-01-06 DIAGNOSIS — I483 Typical atrial flutter: Secondary | ICD-10-CM | POA: Diagnosis not present

## 2023-01-06 DIAGNOSIS — R0609 Other forms of dyspnea: Secondary | ICD-10-CM | POA: Insufficient documentation

## 2023-01-06 DIAGNOSIS — Z87891 Personal history of nicotine dependence: Secondary | ICD-10-CM | POA: Diagnosis not present

## 2023-01-06 DIAGNOSIS — Z951 Presence of aortocoronary bypass graft: Secondary | ICD-10-CM

## 2023-01-06 DIAGNOSIS — I6522 Occlusion and stenosis of left carotid artery: Secondary | ICD-10-CM | POA: Diagnosis not present

## 2023-01-06 DIAGNOSIS — I252 Old myocardial infarction: Secondary | ICD-10-CM | POA: Diagnosis not present

## 2023-01-06 DIAGNOSIS — I5022 Chronic systolic (congestive) heart failure: Secondary | ICD-10-CM | POA: Insufficient documentation

## 2023-01-06 DIAGNOSIS — I110 Hypertensive heart disease with heart failure: Secondary | ICD-10-CM | POA: Diagnosis not present

## 2023-01-06 DIAGNOSIS — Z8673 Personal history of transient ischemic attack (TIA), and cerebral infarction without residual deficits: Secondary | ICD-10-CM | POA: Insufficient documentation

## 2023-01-06 DIAGNOSIS — Z955 Presence of coronary angioplasty implant and graft: Secondary | ICD-10-CM | POA: Diagnosis not present

## 2023-01-06 HISTORY — PX: A-FLUTTER ABLATION: EP1230

## 2023-01-06 LAB — BASIC METABOLIC PANEL
Anion gap: 11 (ref 5–15)
BUN: 15 mg/dL (ref 8–23)
CO2: 25 mmol/L (ref 22–32)
Calcium: 9.1 mg/dL (ref 8.9–10.3)
Chloride: 104 mmol/L (ref 98–111)
Creatinine, Ser: 1.03 mg/dL (ref 0.61–1.24)
GFR, Estimated: >60 mL/min (ref >60)
Glucose, Bld: 108 mg/dL — ABNORMAL HIGH (ref 70–99)
Potassium: 4 mmol/L (ref 3.5–5.1)
Sodium: 140 mmol/L (ref 135–145)

## 2023-01-06 SURGERY — A-FLUTTER ABLATION
Anesthesia: General

## 2023-01-06 MED ORDER — SODIUM CHLORIDE 0.9% FLUSH: 3.0000 mL | INTRAVENOUS | Status: DC | PRN

## 2023-01-06 MED ORDER — FENTANYL CITRATE (PF) 100 MCG/2ML IJ SOLN
INTRAMUSCULAR | Status: DC | PRN
  Administered 2023-01-06 (×2): 50 ug via INTRAVENOUS

## 2023-01-06 MED ORDER — HEPARIN SODIUM (PORCINE) 1000 UNIT/ML IJ SOLN
INTRAMUSCULAR | Status: AC
  Filled 2023-01-06: qty 10

## 2023-01-06 MED ORDER — SUGAMMADEX SODIUM 200 MG/2ML IV SOLN
INTRAVENOUS | Status: DC | PRN
  Administered 2023-01-06: 350 mg via INTRAVENOUS

## 2023-01-06 MED ORDER — ONDANSETRON HCL 4 MG/2ML IJ SOLN
INTRAMUSCULAR | Status: DC | PRN
  Administered 2023-01-06: 4 mg via INTRAVENOUS

## 2023-01-06 MED ORDER — SODIUM CHLORIDE 0.9 % IV SOLN: 250.0000 mL | INTRAVENOUS | Status: DC | PRN

## 2023-01-06 MED ORDER — PROPOFOL 10 MG/ML IV BOLUS
INTRAVENOUS | Status: DC | PRN
  Administered 2023-01-06: 150 mg via INTRAVENOUS

## 2023-01-06 MED ORDER — HEPARIN (PORCINE) IN NACL 1000-0.9 UT/500ML-% IV SOLN
INTRAVENOUS | Status: DC | PRN
  Administered 2023-01-06: 500 mL

## 2023-01-06 MED ORDER — HEPARIN SODIUM (PORCINE) 1000 UNIT/ML IJ SOLN
INTRAMUSCULAR | Status: DC | PRN
  Administered 2023-01-06: 1000 [IU] via INTRAVENOUS

## 2023-01-06 MED ORDER — ROCURONIUM BROMIDE 10 MG/ML (PF) SYRINGE
PREFILLED_SYRINGE | INTRAVENOUS | Status: DC | PRN
  Administered 2023-01-06: 20 mg via INTRAVENOUS
  Administered 2023-01-06: 60 mg via INTRAVENOUS

## 2023-01-06 MED ORDER — DEXAMETHASONE SODIUM PHOSPHATE 4 MG/ML IJ SOLN
INTRAMUSCULAR | Status: DC | PRN
  Administered 2023-01-06: 4 mg via INTRAVENOUS

## 2023-01-06 MED ORDER — ACETAMINOPHEN 325 MG PO TABS: 650.0000 mg | ORAL_TABLET | ORAL | Status: DC | PRN

## 2023-01-06 MED ORDER — ONDANSETRON HCL 4 MG/2ML IJ SOLN: 4.0000 mg | Freq: Four times a day (QID) | INTRAMUSCULAR | Status: DC | PRN

## 2023-01-06 MED ORDER — LIDOCAINE 2% (20 MG/ML) 5 ML SYRINGE
INTRAMUSCULAR | Status: DC | PRN
  Administered 2023-01-06: 60 mg via INTRAVENOUS

## 2023-01-06 MED ORDER — SODIUM CHLORIDE 0.9 % IV SOLN: INTRAVENOUS | Status: DC

## 2023-01-06 MED ORDER — PHENYLEPHRINE HCL-NACL 20-0.9 MG/250ML-% IV SOLN
INTRAVENOUS | Status: DC | PRN
  Administered 2023-01-06: 70 ug/min via INTRAVENOUS

## 2023-01-06 MED ORDER — BUPIVACAINE HCL (PF) 0.25 % IJ SOLN
INTRAMUSCULAR | Status: AC
  Filled 2023-01-06: qty 30

## 2023-01-06 SURGICAL SUPPLY — 12 items
BAG SNAP BAND KOVER 36X36 (MISCELLANEOUS) IMPLANT
CATH JOSEPH QUAD ALLRED 6F REP (CATHETERS) IMPLANT
CATH SMTCH THERMOCOOL SF FJ (CATHETERS) IMPLANT
CATH WEBSTER BI DIR CS D-F CRV (CATHETERS) IMPLANT
PACK EP LATEX FREE (CUSTOM PROCEDURE TRAY) ×1
PACK EP LF (CUSTOM PROCEDURE TRAY) ×1 IMPLANT
PAD DEFIB RADIO PHYSIO CONN (PAD) ×1 IMPLANT
PATCH CARTO3 (PAD) IMPLANT
SHEATH PINNACLE 6F 10CM (SHEATH) IMPLANT
SHEATH PINNACLE 7F 10CM (SHEATH) IMPLANT
SHEATH PINNACLE 8F 10CM (SHEATH) IMPLANT
TUBING SMART ABLATE COOLFLOW (TUBING) IMPLANT

## 2023-01-06 NOTE — Transfer of Care (Signed)
Immediate Anesthesia Transfer of Care Note  Patient: Anthony Wolfe  Procedure(s) Performed: A-FLUTTER ABLATION  Patient Location: Cath Lab  Anesthesia Type:General  Level of Consciousness: awake, alert , and oriented  Airway & Oxygen Therapy: Patient Spontanous Breathing and Patient connected to nasal cannula oxygen  Post-op Assessment: Report given to RN and Post -op Vital signs reviewed and stable  Post vital signs: Reviewed and stable  Last Vitals:  Vitals Value Taken Time  BP    Temp    Pulse    Resp    SpO2      Last Pain:  Vitals:   01/06/23 0809  TempSrc:   PainSc: 0-No pain         Complications: No notable events documented.

## 2023-01-06 NOTE — Anesthesia Preprocedure Evaluation (Signed)
Anesthesia Evaluation  Patient identified by MRN, date of birth, ID band Patient awake    Reviewed: Allergy & Precautions, NPO status , Patient's Chart, lab work & pertinent test results, reviewed documented beta blocker date and time   History of Anesthesia Complications Negative for: history of anesthetic complications  Airway Mallampati: I  TM Distance: >3 FB Neck ROM: Full    Dental  (+) Edentulous Upper, Edentulous Lower, Upper Dentures, Lower Dentures   Pulmonary former smoker   breath sounds clear to auscultation       Cardiovascular hypertension, Pt. on medications and Pt. on home beta blockers (-) angina + CAD, + Past MI, + CABG, + Peripheral Vascular Disease and +CHF  + dysrhythmias Ventricular Tachycardia + pacemaker + Cardiac Defibrillator  Rhythm:Irregular Rate:Normal  '23 ECHO: EF 40-45%. LV EF 42 %. The LV has mildly decreased function. There is moderate LVH, Grade I diastolic dysfunction (impaired relaxation). There is severe akinesis of the left ventricular, basal-mid inferoseptal wall and inferior wall. RVF is low normal. Mild MR    Neuro/Psych   Anxiety Depression    TIACVA    GI/Hepatic negative GI ROS, Neg liver ROS,,,  Endo/Other  negative endocrine ROS    Renal/GU negative Renal ROS     Musculoskeletal   Abdominal   Peds  Hematology  (+) Blood dyscrasia (CML), anemia eliquis   Anesthesia Other Findings   Reproductive/Obstetrics                             Anesthesia Physical Anesthesia Plan  ASA: 4  Anesthesia Plan: General   Post-op Pain Management: Minimal or no pain anticipated   Induction: Intravenous  PONV Risk Score and Plan: 2 and Treatment may vary due to age or medical condition, Dexamethasone and Ondansetron  Airway Management Planned: Oral ETT  Additional Equipment: None  Intra-op Plan:   Post-operative Plan: Extubation in OR  Informed  Consent: I have reviewed the patients History and Physical, chart, labs and discussed the procedure including the risks, benefits and alternatives for the proposed anesthesia with the patient or authorized representative who has indicated his/her understanding and acceptance.     Dental advisory given  Plan Discussed with: CRNA and Anesthesiologist  Anesthesia Plan Comments: (  )        Anesthesia Quick Evaluation

## 2023-01-06 NOTE — Progress Notes (Signed)
Patient and wife was given discharge instructions. Both verbalized understanding. 

## 2023-01-06 NOTE — Discharge Instructions (Signed)

## 2023-01-06 NOTE — Anesthesia Procedure Notes (Signed)
Procedure Name: Intubation Date/Time: 01/06/2023 10:56 AM  Performed by: Caren Macadam, CRNAPre-anesthesia Checklist: Patient identified, Emergency Drugs available, Suction available and Patient being monitored Patient Re-evaluated:Patient Re-evaluated prior to induction Oxygen Delivery Method: Circle system utilized Preoxygenation: Pre-oxygenation with 100% oxygen Induction Type: IV induction Ventilation: Mask ventilation without difficulty Laryngoscope Size: Miller and 2 Grade View: Grade I Tube type: Oral Tube size: 7.5 mm Number of attempts: 1 Airway Equipment and Method: Stylet Placement Confirmation: ETT inserted through vocal cords under direct vision, positive ETCO2 and breath sounds checked- equal and bilateral Secured at: 22 cm Tube secured with: Tape Dental Injury: Teeth and Oropharynx as per pre-operative assessment

## 2023-01-06 NOTE — Anesthesia Postprocedure Evaluation (Signed)
Anesthesia Post Note  Patient: STEHEN DONATI  Procedure(s) Performed: A-FLUTTER ABLATION     Patient location during evaluation: PACU Anesthesia Type: General Level of consciousness: awake and alert Pain management: pain level controlled Vital Signs Assessment: post-procedure vital signs reviewed and stable Respiratory status: spontaneous breathing, nonlabored ventilation, respiratory function stable and patient connected to nasal cannula oxygen Cardiovascular status: blood pressure returned to baseline and stable Postop Assessment: no apparent nausea or vomiting Anesthetic complications: no   No notable events documented.  Last Vitals:  Vitals:   01/06/23 0735 01/06/23 1235  BP: (!) 180/82   Pulse: 71   Resp: 18   Temp: 36.7 C 36.8 C  SpO2: 97%     Last Pain:  Vitals:   01/06/23 1235  TempSrc: Temporal  PainSc: 0-No pain                 [place] Flett

## 2023-01-06 NOTE — H&P (Signed)
Patient Care Team: [name], MD as PCP - General (Family Medicine) [name], MD as PCP - Cardiology (Cardiology) [name], MD as Attending Physician (Cardiology) [name], MD (Family Medicine) [name], MD (Cardiothoracic Surgery) Early, [name], MD as Attending Physician (Vascular Surgery) [name], MD as Consulting Physician (Hematology and Oncology) Duke Salvia, MD as Consulting Physician (Cardiology) Erroll Luna, [facility] as Pharmacist (Pharmacist) Erroll Luna, [facility] as Pharmacist (Pharmacist) [facility]     HPI   Anthony Wolfe is a 77 y.o. male Seen in follow-up for an .Medtronic  ICD implanted for secondary prevention after he presented with ventricular tachycardia 10/15. Catheterization demonstrated occluded vein graft to his RCA ejection fraction was about 40%  He is status post bypass surgery 2010   He underwent EP testing and was found to be inducible.   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   He underwent a thoracentesis initially in 11/15 and redo 12/15.  He saw pulm 1/16 with question as to whether immunotherpay could be explanation of effusion;  He had pleurodesis.  4/23 also found to have recurrent pleural effusions.  Laboratories most consistent with transudate   4/23 referred back to cardiology with a diagnosis of "atrial fibrillation "ECG reviewed--typical atrial flutter underwent cardioversion with significant improvement in dyspnea. Now with recurrent flutter associated with very limited shortness of breath.  Bendopnea, cannot crank weedeater, short of breath rolling over in bed.   Surprisingly no orthopnea or recumbent dyspnea.  No peripheral edema.   DATE TEST EF    9/14 Myoview   EF ?? No ischemia  3/171 Carotids R ICA 40-59%//L ICA- T    10/17 Echo  45-50 % LAE 46/2.26/35  3/23  Echo  40-45%          Antiarrhythmics Date Reason stopped  amio   intolerant  Mex   Insurance refusal      Previously on aspirin and Plavix with a history of remote TIA, now on apixaban   Date Cr K Mg Hgb  5/18  1.1 3.7      11/18 0.9 3.9 2.3 11.5  1/20 1.05 4.3   12.8  3/21 0.94 3.9 1.8 11.1  4/24 1.17 4.8   12.1    Thromboembolic risk factors ( age - -2, HTN-1, TIA/CVA-2, Vasc disease -1, CHF-1) for a CHADSVASc Score of >=7     Past Medical History:  Diagnosis Date   AICD (automatic cardioverter/defibrillator) present     Allergic rhinitis     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 difficile colitis 06/16/2014   CAD (coronary artery disease)      a. s/p 3v CABG 2010. b. NSTEMI 05/2014 in setting of VT. Cath impressions: "Recent IMI with occluded SVG to RCA. Has Right to Right collaterals and collaterals from septal and OM. EF 40%."   Carotid disease, bilateral      a. Right CEA 2002; known occluded left carotid. b. Duplex 06/2014: known occluded LICA, stable 1-39% RICA s/p CEA.   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 Prior EF 36%. b. 2014: 50%. c. 05/2014: EF 35-40% by echo, 40% with inf HK by cath.   NSTEMI (non-ST elevation myocardial infarction) 05/2014   Pleural effusion 05/2014   Pneumonia 05/23/2014   Presence of permanent cardiac pacemaker     Prostate hypertrophy     PVC's (premature ventricular contractions)      a. PVC's with prior Holter showing PVC load of 22%.   Shortness of breath dyspnea     Stroke 01   TIA (transient ischemic attack)      ASPVD, S./P. right CEA, 2002, and known occluded left carotid   Ventricular tachycardia      a. Admitted with VT 05/2013 - EPS with inducible sustained monomorphic VT; s/p Medtronic ICD  05/21/14.           Past Surgical History:  Procedure Laterality Date   BACK SURGERY       CARDIOVERSION N/A 01/04/2022    Procedure: CARDIOVERSION;  Surgeon: Parke Poisson, MD;  Location[facility] ENDOSCOPY;  Service: Cardiovascular;  Laterality: N/A;   CAROTID ENDARTERECTOMY Right 01   carotidectomy   2003    right side   COLONOSCOPY   07/2010   CORONARY ARTERY BYPASS GRAFT   02/2009    3 vessel   ELECTROPHYSIOLOGIC STUDY   05/21/14   ELECTROPHYSIOLOGY STUDY N/A 05/21/2014    Procedure: ELECTROPHYSIOLOGY STUDY;  Surgeon: Duke Salvia, MD;  Location: [facility] CATH LAB;  Service: Cardiovascular;  Laterality: N/A;   EP IMPLANTABLE DEVICE   05/21/14    single chamber Metronic ICD   IR THORACENTESIS ASP PLEURAL SPACE W/IMG GUIDE   12/07/2021   LEFT HEART CATHETERIZATION WITH CORONARY/GRAFT ANGIOGRAM N/A 05/19/2014    Procedure: LEFT HEART CATHETERIZATION WITH Isabel Caprice;  Surgeon: Wendall Stade, MD;  Location[facility] C[facility] LAB;  Service: Cardiovascular;  Laterality: N/A;   PLEURAL BIOPSY N/A 11/27/2014    Procedure: PLEURAL BIOPSY;  Surgeon: [name], MD;  Location: [facility] OR;  Service: Thoracic;  Laterality: N/A;   PLEURAL EFFUSION DRAINAGE Right 11/27/2014    Procedure: DRAINAGE OF PLEURAL EFFUSION;  Surgeon: [name], MD;  Location: [facility] OR;  Service: Thoracic;  Laterality: Right;   TALC PLEURODESIS Right 11/27/2014    Procedure: Lurlean Nanny;  Surgeon: [name], MD;  Location[facility] OR;  Service: Thoracic;  Laterality: Right;   VIDEO ASSISTED THORACOSCOPY Right 11/27/2014    Procedure: RIGHT VIDEO ASSISTED THORACOSCOPY;  Surgeon: [name], MD;  Location: [facility] OR;  Service: Thoracic;  Laterality: Right;            Current Outpatient Medications  Medication Sig Dispense Refill   albuterol (VENTOLIN HFA) 108 (90 Base) MCG/ACT inhaler INHALE 2 PUFFS BY MOUTH EVERY 6 HOURS AS NEEDED FOR WHEEZING FOR SHORTNESS OF BREATH 18 g 11   apixaban  (ELIQUIS) 5 MG TABS tablet Take 1 tablet (5 mg total) by mouth 2 (two) times daily. 60 tablet 6   atorvastatin (LIPITOR) 80 MG tablet TAKE 1 TABLET BY MOUTH ONCE DAILY AT  6PM 90 tablet 0   carvedilol (COREG) 25 MG tablet Take 1 tablet (25 mg total) by mouth 2 (two) times daily. Please call [phone] to schedule an appointment for June for future refills. Thank you. 180 tablet 0   empagliflozin (JARDIANCE) 10 MG TABS tablet Take 1 tablet (10 mg total) by mouth daily before breakfast. 30 tablet 11   ezetimibe (ZETIA) 10 MG tablet  Take 1 tablet (10 mg total) by mouth daily. 90 tablet 3   furosemide (LASIX) 20 MG tablet Take 1 tablet (20 mg total) by mouth 2 (two) times daily. 180 tablet 1   meclizine (ANTIVERT) 25 MG tablet Take 1 tablet (25 mg total) by mouth 3 (three) times daily as needed for dizziness. 30 tablet 0   nilotinib (TASIGNA) 150 MG capsule Take 300 mg by mouth every 12 (twelve) hours.       nitroGLYCERIN (NITROSTAT) 0.4 MG SL tablet Place 0.4 mg under the tongue every 5 (five) minutes as needed for chest pain.       valsartan (DIOVAN) 160 MG tablet Take 160 mg by mouth daily. (Patient not taking: Reported on 11/21/2022)        No current facility-administered medications for this visit.           Allergies  Allergen Reactions   Breztri Aerosphere [Budeson-Glycopyrrol-Formoterol] Swelling      Swelling of mouth tongue    Norvasc [Amlodipine Besylate] Rash      rash   Sulfa Antibiotics Hives      Hives        Review of Systems negative except from HPI and PMH   Physical Exam BP 112/64   Pulse 89   Ht 5\' 9"  (1.753 m)   Wt 182 lb 3.2 oz (82.6 kg)   SpO2 97%   BMI 26.91 kg/m  Well developed and well nourished in no acute distress HENT normal Neck supple with JVP10 Clear Device pocket well healed; without hematoma or erythema.  There is no tethering  Irregular rate and rhythm, no murmur Abd-soft with active BS No Clubbing cyanosis  edema Skin-warm and dry A &  Oriented  Grossly normal sensory and motor function   ECG atrial flutter @ 87 with variable block and can   Device function is normal. Programming changes noen  See Paceart for details     ECG *sinus at 77 Intervals 20/14/42 Axis left -62  Repolarization abnormalities are unchanged from 2020.  QRS duration has widened from 122--140 ms   Assessment and  Plan   Ventricular tachycardia-non sustained   Exertional chest discomfort   Dyspnea on exertion   Syncope - no recurrent   ischemic cardiomyopathy depressed LV function and prior bypass   Implantable defibrillator-Medtronic   Carotid Stenosis  Known L occlusion     CHF-chronic-systolic-exacerbation   CML   TIA   Skips consistent with PVCs   Atrial flutter   No intercurrent ventricular tachycardia   Patient has recurrent symptomatic atrial flutter, previously diagnosed as atrial fibrillation.  Hence, I think the primary strategy should be catheter ablation.  I have discussed with Dr. Leonia Reeves  .  We have reviewed risks and benefits including but not limited to death stroke bleeding issues as well as heart block might require upgrade of his single-chamber defibrillator.   This will be about a month out given his symptoms, and the fact that he has missed no anticoagulation, we will undertake cardioversion, and there is a note needed for slot this week 4/18   There would be no benefit regarding anticoagulation given his prior TIA; however, his symptoms have been really very life limiting and the recurrence of atrial flutter is significantly decreased by catheter ablation   His congestive heart failure is at least in part related to this temporally and he is volume overloaded on examination.  Surprisingly he is able to lie flat so I think he should  tolerate the procedure just fine.    EP Attending  Patient seen and examined. Agree with the findings as noted above. The patient presents for EP study and catheter ablation of  atrial flutter. I have reviewed the indications/risks/benefits/goals/expectations of the procedure and he wishes to proceed.  Sharlot Gowda Valita Righter,MD

## 2023-01-06 NOTE — Progress Notes (Signed)
25F, 29F, 75F venous sheaths to right groin removed.  Manual pressure held for 15 minutes.  Right groin site level 0 and post pull.  Right DP pulse palpable post sheath pull.  Bedrest started at 1255.  Gauze and tegaderm applied to right groin.  Instructions given with patient.

## 2023-01-09 ENCOUNTER — Encounter (HOSPITAL_COMMUNITY): Payer: Self-pay | Admitting: Internal Medicine

## 2023-01-13 ENCOUNTER — Other Ambulatory Visit: Payer: Self-pay | Admitting: Family Medicine

## 2023-01-13 NOTE — Telephone Encounter (Signed)
Requested Prescriptions  Pending Prescriptions Disp Refills   atorvastatin (LIPITOR) 80 MG tablet [Pharmacy Med Name: Atorvastatin Calcium 80 MG Oral Tablet] 90 tablet 2    Sig: TAKE 1 TABLET BY MOUTH ONCE DAILY AT  6PM     Cardiovascular:  Antilipid - Statins Failed - 01/13/2023  6:50 AM      Failed - Valid encounter within last 12 months    Recent Outpatient Visits           1 year ago Dyspnea, unspecified type   [facility] [name], MD   1 year ago Bilateral carotid artery stenosis   [facility] Tanya Nones Priscille Heidelberg, MD   1 year ago COVID   [facility] Pickard, Priscille Heidelberg, MD   1 year ago SOB (shortness of breath)   [facility] Pickard, Priscille Heidelberg, MD   1 year ago History of ASCVD   [facility] Pickard, Priscille Heidelberg, MD       Future Appointments             In 3 weeks [name], MD [facility]   In 1 month Lynnette Caffey, Charlies Constable, MD [facility] at [facility]            Failed - Lipid Panel in normal range within the last 12 months    Cholesterol, Total  Date Value Ref Range Status  11/02/2022 84 (L) 100 - 199 mg/dL Final   LDL Cholesterol (Calc)  Date Value Ref Range Status  02/18/2021 63 mg/dL (calc) Final    Comment:    Reference range: <100 . Desirable range <100 mg/dL for primary prevention;   <70 mg/dL for patients with CHD or diabetic patients  with > or = 2 CHD risk factors. Marland Kitchen LDL-C is now calculated using the Martin-Hopkins  calculation, which is a validated novel method providing  better accuracy than the Friedewald equation in the  estimation of LDL-C.  Horald Pollen et al. Lenox Ahr. 1324;401(02): 2061-2068  (http://education.[employer].com/faq/FAQ164)    LDL Chol Calc (NIH)  Date Value Ref Range Status  11/02/2022 33 0 - 99 mg/dL Final   HDL  Date Value Ref Range Status  11/02/2022 34 (L) >39  mg/dL Final   Triglycerides  Date Value Ref Range Status  11/02/2022 84 0 - 149 mg/dL Final         Passed - Patient is not pregnant

## 2023-01-16 ENCOUNTER — Other Ambulatory Visit: Payer: Self-pay | Admitting: Internal Medicine

## 2023-01-16 ENCOUNTER — Other Ambulatory Visit: Payer: Self-pay | Admitting: Family Medicine

## 2023-01-17 NOTE — Telephone Encounter (Signed)
Requested Prescriptions  Refused Prescriptions Disp Refills   clopidogrel (PLAVIX) 75 MG tablet [Pharmacy Med Name: Clopidogrel Bisulfate 75 MG Oral Tablet] 90 tablet 0    Sig: Take 1 tablet by mouth once daily     Hematology: Antiplatelets - clopidogrel Failed - 01/16/2023  6:52 AM      Failed - HGB in normal range and within 180 days    Hemoglobin  Date Value Ref Range Status  12/12/2022 12.3 (L) 13.0 - 17.0 g/dL Final  40/98/1191 47.8 (L) 13.0 - 17.7 g/dL Final   HGB  Date Value Ref Range Status  06/08/2017 11.5 (L) 13.0 - 17.1 g/dL Final         Failed - Valid encounter within last 6 months    Recent Outpatient Visits           1 year ago Dyspnea, unspecified type   [facility] [name], MD   1 year ago Bilateral carotid artery stenosis   [facility] [name], MD   1 year ago COVID   [facility] Pickard, Priscille Heidelberg, MD   1 year ago SOB (shortness of breath)   [facility] Pickard, Priscille Heidelberg, MD   1 year ago History of ASCVD   [facility] [name], MD       Future Appointments             In 3 weeks [name], MD [facility]   In 1 month Lynnette Caffey, Charlies Constable, MD [facility]            Passed - HCT in normal range and within 180 days    HCT  Date Value Ref Range Status  12/12/2022 39.0 39.0 - 52.0 % Final  06/08/2017 35.9 (L) 38.4 - 49.9 % Final   Hematocrit  Date Value Ref Range Status  02/03/2022 30.5 (L) 37.5 - 51.0 % Final         Passed - PLT in normal range and within 180 days    Platelets  Date Value Ref Range Status  12/12/2022 196 150 - 400 K/uL Final  02/03/2022 232 150 - 450 x10E3/uL Final         Passed - Cr in normal range and within 360 days    Creatinine  Date Value Ref Range Status  01/22/2021 1.13 0.61 - 1.24 mg/dL Final  29/56/2130 0.9 0.7 -  1.3 mg/dL Final   Creat  Date Value Ref Range Status  05/31/2019 0.90 0.70 - 1.18 mg/dL Final    Comment:    For patients >75 years of age, the reference limit for Creatinine is approximately 13% higher for people identified as African-American. .    Creatinine, Ser  Date Value Ref Range Status  01/06/2023 1.03 0.61 - 1.24 mg/dL Final

## 2023-01-26 ENCOUNTER — Telehealth: Payer: Self-pay

## 2023-01-26 NOTE — Telephone Encounter (Signed)
Fax received from [employer] and pt has been enrolled in their patient assistance program for [place]. Mjp,lpn

## 2023-01-30 ENCOUNTER — Other Ambulatory Visit: Payer: Self-pay | Admitting: Family Medicine

## 2023-01-30 ENCOUNTER — Other Ambulatory Visit: Payer: Self-pay | Admitting: Internal Medicine

## 2023-02-07 ENCOUNTER — Ambulatory Visit: Payer: Medicare HMO | Attending: Internal Medicine | Admitting: Internal Medicine

## 2023-02-07 ENCOUNTER — Encounter: Payer: Self-pay | Admitting: Internal Medicine

## 2023-02-07 VITALS — BP 126/66 | HR 64 | Ht 69.0 in | Wt 186.0 lb

## 2023-02-07 DIAGNOSIS — I483 Typical atrial flutter: Secondary | ICD-10-CM

## 2023-02-07 NOTE — Progress Notes (Signed)
HPI Mr. Gelhar returns today for followup. He is a pleasant 77 yo man with CAD, VT, and chronic systolic heart failure, s/p ICD who developed atrial flutter and underwent DCCV. He has done well in the interim with no chest pain or sob or edema.  Allergies  Allergen Reactions   Breztri Aerosphere [Budeson-Glycopyrrol-Formoterol] Swelling    Swelling of mouth tongue    Norvasc [Amlodipine Besylate] Rash    rash   Sulfa Antibiotics Hives    Hives      Current Outpatient Medications  Medication Sig Dispense Refill   apixaban (ELIQUIS) 5 MG TABS tablet Take 1 tablet (5 mg total) by mouth 2 (two) times daily. 60 tablet 6   atorvastatin (LIPITOR) 80 MG tablet TAKE 1 TABLET BY MOUTH ONCE DAILY AT  6PM 90 tablet 2   carvedilol (COREG) 25 MG tablet TAKE 1 TABLET BY MOUTH TWICE DAILY. PLEASE CALL [phone] TO SCHEDULE AN APPOINTMENT FOR JUNE FOR FUTURE REFILLS. 60 tablet 0   diphenhydramine-acetaminophen (TYLENOL PM) 25-500 MG TABS tablet Take 1 tablet by mouth at bedtime as needed (sleep).     empagliflozin (JARDIANCE) 10 MG TABS tablet Take 1 tablet (10 mg total) by mouth daily before breakfast. 30 tablet 11   ezetimibe (ZETIA) 10 MG tablet Take 1 tablet by mouth once daily 90 tablet 2   furosemide (LASIX) 20 MG tablet Take 1 tablet (20 mg total) by mouth 2 (two) times daily. (Patient taking differently: Take 20 mg by mouth daily as needed for fluid or edema.) 180 tablet 1   meclizine (ANTIVERT) 25 MG tablet Take 25 mg by mouth 3 (three) times daily as needed for dizziness.     Multiple Vitamin (MULTIVITAMIN) tablet Take 1 tablet by mouth daily.     nilotinib (TASIGNA) 50 MG capsule Take 2 capsules (100 mg total) by mouth every 12 (twelve) hours. Take on empty stomach, at least one hour before or two hours after food. (Patient taking differently: Take 50 mg by mouth every 12 (twelve) hours. Take on empty stomach, at least one hour before or two hours after food.) 360 capsule 0    nitroGLYCERIN (NITROSTAT) 0.4 MG SL tablet Place 0.4 mg under the tongue every 5 (five) minutes as needed for chest pain.     albuterol (VENTOLIN HFA) 108 (90 Base) MCG/ACT inhaler INHALE 2 PUFFS BY MOUTH EVERY 6 HOURS AS NEEDED FOR WHEEZING FOR SHORTNESS OF BREATH (Patient not taking: Reported on 02/07/2023) 18 g 11   No current facility-administered medications for this visit.     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 difficile colitis 06/16/2014   CAD (coronary artery disease)    a. s/p 3v CABG 2010. b. NSTEMI 05/2014 in setting of VT. Cath impressions: "Recent IMI with occluded SVG to RCA. Has Right to Right collaterals and collaterals from septal and OM. EF 40%."   Carotid disease, bilateral (HCC)    a. Right CEA 2002; known occluded left carotid. b. Duplex 06/2014: known occluded LICA, stable 1-39% RICA s/p CEA.   Chronic systolic CHF (congestive heart failure) (HCC)    Clotting disorder (HCC)    CML (chronic myelocytic leukemia) (HCC) 05/09/2012   Depression    Emphysema of lung (HCC)    HTN (hypertension)    Hyperlipidemia  Ischemic cardiomyopathy    a. Prior EF 36%. b. 2014: 50%. c. 05/2014: EF 35-40% by echo, 40% with inf HK by cath.   NSTEMI (non-ST elevation myocardial infarction) (HCC) 05/08/2014   Pleural effusion 05/08/2014   Pneumonia 05/23/2014   Presence of permanent cardiac pacemaker    Prostate hypertrophy    PVC's (premature ventricular contractions)    a. PVC's with prior Holter showing PVC load of 22%.   Shortness of breath dyspnea    Stroke (HCC) 08/09/1999   TIA (transient ischemic attack)    ASPVD, S./P. right CEA, 2002, and known occluded left carotid   Ventricular tachycardia (HCC)    a. Admitted with VT 05/2013 - EPS with inducible sustained monomorphic VT; s/p [employer] ICD 05/21/14.     ROS:   All systems reviewed and negative except as noted in the HPI.   Past Surgical History:  Procedure Laterality Date   A-FLUTTER ABLATION N/A 01/06/2023   Procedure: A-FLUTTER ABLATION;  Surgeon: [name], MD;  Location: [facility] INVASIVE CV LAB;  Service: Cardiovascular;  Laterality: N/A;   BACK SURGERY     CARDIOVERSION N/A 01/04/2022   Procedure: CARDIOVERSION;  Surgeon: Parke Poisson, MD;  Location: [facility] ENDOSCOPY;  Service: Cardiovascular;  Laterality: N/A;   CARDIOVERSION N/A 11/24/2022   Procedure: CARDIOVERSION;  Surgeon: Thomasene Ripple, DO;  Location: [facility] INVASIVE CV LAB;  Service: Cardiovascular;  Laterality: N/A;   CAROTID ENDARTERECTOMY Right 01   carotidectomy  2003   right side   COLONOSCOPY  07/2010   CORONARY ARTERY BYPASS GRAFT  02/2009   3 vessel   ELECTROPHYSIOLOGIC STUDY  05/21/14   ELECTROPHYSIOLOGY STUDY N/A 05/21/2014   Procedure: ELECTROPHYSIOLOGY STUDY;  Surgeon: Duke Salvia, MD;  Location: [facility] CATH LAB;  Service: Cardiovascular;  Laterality: N/A;   EP IMPLANTABLE DEVICE  05/21/14   single chamber Metronic ICD   IR THORACENTESIS ASP PLEURAL SPACE W/IMG GUIDE  12/07/2021   LEFT HEART CATHETERIZATION WITH CORONARY/GRAFT ANGIOGRAM N/A 05/19/2014   Procedure: LEFT HEART CATHETERIZATION WITH Isabel Caprice;  Surgeon: Wendall Stade, MD;  Location: [facility] CATH LAB;  Service: Cardiovascular;  Laterality: N/A;   PLEURAL BIOPSY N/A 11/27/2014   Procedure: PLEURAL BIOPSY;  Surgeon: [name], MD;  Location: [facility] OR;  Service: Thoracic;  Laterality: N/A;   PLEURAL EFFUSION DRAINAGE Right 11/27/2014   Procedure: DRAINAGE OF PLEURAL EFFUSION;  Surgeon: [name], MD;  Location[facility] OR;  Service: Thoracic;  Laterality: Right;   TALC PLEURODESIS Right 11/27/2014   Procedure: Lurlean Nanny;  Surgeon: [name], MD;  Location: [facility] OR;  Service: Thoracic;  Laterality: Right;   VIDEO ASSISTED THORACOSCOPY Right 11/27/2014   Procedure:  RIGHT VIDEO ASSISTED THORACOSCOPY;  Surgeon: [name], MD;  Location: [facility] OR;  Service: Thoracic;  Laterality: Right;     Family History  Problem Relation Age of Onset   Heart disease Mother    Alcohol abuse Father      Social History   Socioeconomic History   Marital status: Married    Spouse name: Not on file   Number of children: 2   Years of education: Not on file   Highest education level: Not on file  Occupational History    Comment: retired Administrator  Tobacco Use   Smoking status: Former    Packs/day: 3.00    Years: 45.00    Additional pack years: 0.00    Total pack years: 135.00    Types: Cigarettes  Quit date: 08/08/1997    Years since quitting: 25.5    Passive exposure: Past   Smokeless tobacco: Former  Building services engineer Use: Never used  Substance and Sexual Activity   Alcohol use: No    Alcohol/week: 0.0 standard drinks of alcohol   Drug use: No   Sexual activity: Yes  Other Topics Concern   Not on file  Social History Narrative   Not on file   Social Determinants of Health   Financial Resource Strain: Low Risk  (11/17/2022)   Overall Financial Resource Strain (CARDIA)    Difficulty of Paying Living Expenses: Not hard at all  Food Insecurity: No Food Insecurity (11/17/2022)   Hunger Vital Sign    Worried About Running Out of Food in the Last Year: Never true    Ran Out of Food in the Last Year: Never true  Transportation Needs: No Transportation Needs (11/17/2022)   PRAPARE - Administrator, Civil Service (Medical): No    Lack of Transportation (Non-Medical): No  Physical Activity: Insufficiently Active (11/11/2021)   Exercise Vital Sign    Days of Exercise per Week: 3 days    Minutes of Exercise per Session: 30 min  Stress: No Stress Concern Present (11/17/2022)   [employer] of Occupational Health - Occupational Stress Questionnaire    Feeling of Stress : Not at all  Social Connections: Socially Integrated  (11/17/2022)   Social Connection and Isolation Panel [NHANES]    Frequency of Communication with Friends and Family: More than three times a week    Frequency of Social Gatherings with Friends and Family: More than three times a week    Attends Religious Services: More than 4 times per year    Active Member of [employer] or Organizations: Yes    Attends Engineer, structural: More than 4 times per year    Marital Status: Married  Catering manager Violence: Not At Risk (11/17/2022)   Humiliation, Afraid, Rape, and Kick questionnaire    Fear of Current or Ex-Partner: No    Emotionally Abused: No    Physically Abused: No    Sexually Abused: No     BP 126/66   Pulse 64   Ht 5\' 9"  (1.753 m)   Wt 186 lb (84.4 kg)   SpO2 97%   BMI 27.47 kg/m   Physical Exam:  Well appearing NAD HEENT: Unremarkable Neck:  No JVD, no thyromegally Lymphatics:  No adenopathy Back:  No CVA tenderness Lungs:  Clear HEART:  Regular rate rhythm, no murmurs, no rubs, no clicks Abd:  soft, positive bowel sounds, no organomegally, no rebound, no guarding Ext:  2 plus pulses, no edema, no cyanosis, no clubbing Skin:  No rashes no nodules Neuro:  CN II through XII intact, motor grossly intact  EKG - nsr   Assess/Plan: Atrial flutter - he is s/p ablation and is doing well.  Coags - I discussed the pro's and con's of stopping eliquis. I will reach out to Dr. Crissie Sickles to discuss. VT - he has not had more. We will follow.  Sharlot Gowda Aleksandr Pellow,MD

## 2023-02-13 DIAGNOSIS — E663 Overweight: Secondary | ICD-10-CM | POA: Diagnosis not present

## 2023-02-13 DIAGNOSIS — Z6827 Body mass index (BMI) 27.0-27.9, adult: Secondary | ICD-10-CM | POA: Diagnosis not present

## 2023-02-13 DIAGNOSIS — R079 Chest pain, unspecified: Secondary | ICD-10-CM | POA: Diagnosis not present

## 2023-02-14 NOTE — Progress Notes (Signed)
Cardiology Office Note:    Date:  02/17/2023   ID:  Anthony Wolfe, DOB 04/13/1946, MRN [id number]  PCP:  [name], MD   [facility] Providers Cardiologist:  Alverda Skeans, MD Referring MD: [name], MD   Chief Complaint/Reason for Referral: Cardiology follow-up  ASSESSMENT:    1. Typical atrial flutter (HCC)   2. Atrial fibrillation, unspecified type (HCC)   3. Cardiomyopathy, ischemic   4. Coronary artery disease involving native coronary artery of native heart without angina pectoris   5. Hyperlipidemia LDL goal <70   6. Bilateral carotid artery disease, unspecified type (HCC)   7. Aortic atherosclerosis (HCC)   8. ICD (implantable cardioverter-defibrillator) in place     PLAN:    In order of problems listed above: 1.  Atypical flutter: Now status post ablation.  There seems to be some question about whether the patient indeed carries a diagnosis of atrial fibrillation and whether anticoagulation is therefore required.  I will reach out to EP to discuss further.  If Eliquis can be stopped we will restart ASA monotherapy. 2.  Atrial fibrillation: See discussion above. 3.  Ischemic cardiomyopathy: Will obtain echocardiogram now that the patient is in normal sinus rhythm.  Will tailor medical therapy based on these results.  Increase Jardiance to 25 mg.  Continue Coreg. 4.  Coronary artery disease: Continue Coreg, Lipitor, and Eliquis and lieu of aspirin.  If Eliquis is stopped we will start aspirin. 5.  Hyperlipidemia: Lipid panel in March was at goal with an LDL of 33. 6.  Bilateral carotid disease: Continue aspirin, statin, and blood pressure control. 7.  Aortic atherosclerosis: Continue aspirin, statin, blood pressure control. 8.  Status post ICD: Followed by EP.             Dispo:  Return in about 6 months (around 08/20/2023).      Medication Adjustments/Labs and Tests Ordered: Current medicines are reviewed at length with the patient today.   Concerns regarding medicines are outlined above.  The following changes have been made:     Labs/tests ordered: Orders Placed This Encounter  Procedures   EKG 12-Lead   ECHOCARDIOGRAM COMPLETE    Medication Changes: Meds ordered this encounter  Medications   furosemide (LASIX) 20 MG tablet    Sig: Take 1 tablet (20 mg total) by mouth as needed.    Dispense:  90 tablet    Refill:  3   empagliflozin (JARDIANCE) 25 MG TABS tablet    Sig: Take 1 tablet (25 mg total) by mouth daily before breakfast.    Dispense:  90 tablet    Refill:  3    Current medicines are reviewed at length with the patient today.  The patient does not have concerns regarding medicines.  History of Present Illness:    FOCUSED PROBLEM LIST:   1.  Coronary artery disease status post CABG consisting of a LIMA to LAD, vein graft to obtuse marginal 1 and 2, and vein graft to RCA in 2010; the vein graft to RCA is known occluded on cardiac catheterization in 2015 2.  Ischemic cardiomyopathy EF 40-45% 3.  VT status post ICD followed by EP 4.  Hypertension 5.  Hyperlipidemia; low LP(a) 6.  Peripheral vascular disease status post right carotid enterectomy with known occluded left internal carotid artery 7.  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  Emphysema 9.  Chronic occlusion of left internal carotid and 50 to 69% stenosis of right internal carotid followed by patient's  PCP 10.  Right bundle branch block 11.  Atrial fibrillation CV2 score 4 on Eliquis;  status post cardioversion May 2023 12.  Aortic atherosclerosis on chest CT 2018 13.  History of multiple thoracenteses for pleural effusions 14.  Atrial flutter status post ablation May 2024   March 2023: Patient seen for initial consultation to reestablish cardiovascular care.  At that appointment losartan was started as well as Jardiance and his Coreg was increased to 25 mg.   April 2023: Patient was seen for expedited follow-up due to the detection of atrial  fibrillation at his PCPs office.  He was started on Eliquis 5 mg twice daily.  His mexiletine was discontinued.  He was referred for cardioversion.  Additionally his losartan was started at 25 mg every afternoon.   May 2023: The patient was seen for expedited follow-up due to worsening shortness of breath.  He was sent to the emergency department due to suspected pleural effusion.  He was admitted and underwent thoracentesis.  Around 850 cc of straw-colored fluid was evacuated.  Cytology was negative.  Laboratories were consistent with an exudative effusion.   June 2023: In the interim he underwent cardioversion at the end of May to normal sinus rhythm.  He had a nuisance nosebleed earlier this week and stopped his Eliquis.  He has complaints of fatigue.  However he is able to do a lot of activities including working outside.  He denies any significant shortness of breath, paroxysmal nocturnal dyspnea, orthopnea.  He feels better after his cardioversion is well.  He denies any exertional angina.  He has not required emergency room visits or hospitalizations.  He is otherwise well and without complaints.  Plan: Check lipids, Lp(a), CMP, CBC, reflex TSH, and sleep study.  Restart Eliquis.   March 2023: All blood tests were within normal limits.  A sleep study was not performed.  He was recently seen by his pulmonologist.  He endorsed shortness of breath.  A BNP was checked and found to be elevated to 616.  His creatinine was stable around 1.  Because of these constellation of signs and symptoms he was referred for an expedited appointment.  His Lasix was increased from as needed to 20 mg twice daily.  He tells me he urinated quite a bit in response to the Lasix.  He feels much better however he continues to have some shortness of breath with more than moderate exertion.  He denies any palpitations or severe bleeding.  He denies any paroxysmal nocturnal dyspnea, orthopnea, or exertional chest pain.  He does not  wheeze very often.  He tells me after his cardioversion last year he felt much improved.  He cannot pinpoint a day when things became worse for him and describes more of an insidious course over the last few months.  He fortunately has not required any emergency room visits or hospitalizations.  Plan: Refer to EP due to recurrent atrial fibrillation, discontinue losartan as patient is on valsartan as well, discontinue Plavix as patient is on Eliquis, check lipid panel/LFTs.  Today: In the interim the patient was seen by EP and was diagnosed with symptomatic atrial flutter.  He underwent cardioversion followed by atrial flutter ablation.  He was seen in EP follow-up earlier this month and was doing well.  He was taken off his losartan in the interim due to low blood pressures.  He was completely asymptomatic however.  He continues to do well.  He feels much better following the ablation.  He now only takes Lasix as needed.  He reports an atypical chest pain.  It happens at rest and it wraps around his lower chest and then moved to his back.  It does not occur with exertion.  He does have a history of multiple back surgeries.  He has required no emergency room visits or hospitalizations.  He has had no bleeding or bruising episodes while on Eliquis.       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 difficile colitis 06/16/2014   CAD (coronary artery disease)    a. s/p 3v CABG 2010. b. NSTEMI 05/2014 in setting of VT. Cath impressions: "Recent IMI with occluded SVG to RCA. Has Right to Right collaterals and collaterals from septal and OM. EF 40%."   Carotid disease, bilateral (HCC)    a. Right CEA 2002; known occluded left carotid. b. Duplex 06/2014: known occluded LICA, stable 1-39% RICA s/p CEA.   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 Prior EF 36%. b. 2014: 50%. c. 05/2014: EF 35-40% by echo, 40% with inf HK by cath.   NSTEMI (non-ST elevation myocardial infarction) (HCC) 05/08/2014   Pleural effusion 05/08/2014   Pneumonia 05/23/2014   Presence of permanent cardiac pacemaker    Prostate hypertrophy    PVC's (premature ventricular contractions)    a. PVC's with prior Holter showing PVC load of 22%.   Shortness of breath dyspnea    Stroke (HCC) 08/09/1999   TIA (transient ischemic attack)    ASPVD, S./P. right CEA, 2002, and known occluded left carotid   Ventricular tachycardia (HCC)    a. Admitted with VT 05/2013 - EPS with inducible sustained monomorphic VT; s/p [employer] ICD 05/21/14.     Current Medications: Current Meds  Medication Sig   apixaban (ELIQUIS) 5 MG TABS tablet Take 1 tablet (5 mg total) by mouth 2 (two) times daily.   atorvastatin (LIPITOR) 80 MG tablet TAKE 1 TABLET BY MOUTH ONCE DAILY AT  6PM   carvedilol (COREG) 25 MG tablet TAKE 1 TABLET BY MOUTH TWICE DAILY. PLEASE CALL [phone] TO SCHEDULE AN APPOINTMENT FOR JUNE FOR FUTURE REFILLS.   diphenhydramine-acetaminophen (TYLENOL PM) 25-500 MG TABS tablet Take 1 tablet by mouth at bedtime as needed (sleep).   empagliflozin (JARDIANCE) 25 MG TABS tablet Take 1 tablet (25 mg total) by mouth daily before breakfast.   ezetimibe (ZETIA) 10 MG tablet Take 1 tablet by mouth once daily   furosemide (LASIX) 20 MG tablet Take 1 tablet (20 mg total) by mouth as needed.   meclizine (ANTIVERT) 25 MG tablet Take 25 mg by mouth 3 (three) times daily as needed for dizziness.   nilotinib (TASIGNA) 50 MG capsule Take 2 capsules (100 mg total) by mouth every 12 (twelve) hours. Take on empty stomach, at least one hour before or two  hours after food. (Patient taking differently: Take 50 mg by mouth every 12 (twelve) hours. Take on empty stomach, at least one hour before or two hours after food.)   nitroGLYCERIN (NITROSTAT) 0.4 MG SL  tablet Place 0.4 mg under the tongue every 5 (five) minutes as needed for chest pain.   [DISCONTINUED] empagliflozin (JARDIANCE) 10 MG TABS tablet Take 1 tablet (10 mg total) by mouth daily before breakfast.   [DISCONTINUED] furosemide (LASIX) 20 MG tablet Take 1 tablet (20 mg total) by mouth 2 (two) times daily. (Patient taking differently: Take 20 mg by mouth daily as needed for fluid or edema.)     Allergies:    Breztri aerosphere [budeson-glycopyrrol-formoterol], Norvasc [amlodipine besylate], and Sulfa antibiotics   Social History:   Social History   Tobacco Use   Smoking status: Former    Current packs/day: 0.00    Average packs/day: 3.0 packs/day for 45.0 years (135.0 ttl pk-yrs)    Types: Cigarettes    Start date: 08/08/1952    Quit date: 08/08/1997    Years since quitting: 25.5    Passive exposure: Past   Smokeless tobacco: Former  Building services engineer status: Never Used  Substance Use Topics   Alcohol use: No    Alcohol/week: 0.0 standard drinks of alcohol   Drug use: No     Family Hx: Family History  Problem Relation Age of Onset   Heart disease Mother    Alcohol abuse Father      Review of Systems:   Please see the history of present illness.    All other systems reviewed and are negative.     EKGs/Labs/Other Test Reviewed:    EKG:    EKG Interpretation Date/Time:    Ventricular Rate:    PR Interval:    QRS Duration:    QT Interval:    QTC Calculation:   R Axis:      Text Interpretation:           Prior CV studies reviewed:  Cardiac Studies & Procedures     STRESS TESTS  MYOCARDIAL PERFUSION IMAGING 04/13/2017  Narrative  Nuclear stress EF: 35%.  Inferior/inferoseptal defect consistent iwht scar No significant ischemia  Intermediate  risk study   ECHOCARDIOGRAM  ECHOCARDIOGRAM COMPLETE 10/27/2021  Narrative ECHOCARDIOGRAM REPORT    Patient Name:   LION FERNANDEZ Date of Exam: 10/27/2021 Medical Rec #:  [id number]      Height:       69.0 in Accession #:    [id number]     Weight:       180.0 lb Date of Birth:  [birth year]      BSA:          1.976 m Patient Age:    75 years       BP:           177/80 mmHg Patient Gender: M              HR:           64 bpm. Exam Location:  [address]  Procedure: 2D Echo, 3D Echo, Cardiac Doppler and Color Doppler  Indications:    I25.10 CAD  History:        Patient has prior history of Echocardiogram examinations, most recent 05/28/2020. Ischemic cardiomyopathy, Pacemaker and Defibrillator; Carotid Disease.  Sonographer:    Clearence Ped RCS Referring Phys: [id number] [name]  IMPRESSIONS   1. Left ventricular ejection fraction, by estimation, is 40 to 45%. Left ventricular ejection fraction by 3D volume is 42 %. The left ventricle has mildly decreased function. The left ventricle demonstrates regional wall motion abnormalities (see scoring diagram/findings for description). There is moderate left ventricular  hypertrophy. Left ventricular diastolic parameters are consistent with Grade I diastolic dysfunction (impaired relaxation). There is severe akinesis of the left ventricular, basal-mid inferoseptal wall and inferior wall. 2. Right ventricular systolic function is low normal. The right ventricular size is normal. 3. Left atrial size was moderately dilated. 4. The mitral valve is grossly normal. Mild mitral valve regurgitation. 5. The aortic valve is tricuspid. Aortic valve regurgitation is not visualized. 6. The pulmonic valve was abnormal. 7. Aortic dilatation noted. There is borderline dilatation of the aortic root, measuring 38 mm. 8. The inferior vena cava is normal in size with greater than 50% respiratory variability, suggesting right atrial pressure of 3  mmHg.  Comparison(s): No significant change from prior study. 05/28/2020: LVEF 40-45%, global HK.  FINDINGS Left Ventricle: Left ventricular ejection fraction, by estimation, is 40 to 45%. Left ventricular ejection fraction by 3D volume is 42 %. The left ventricle has mildly decreased function. The left ventricle demonstrates regional wall motion abnormalities. Severe akinesis of the left ventricular, basal-mid inferoseptal wall and inferior wall. The left ventricular internal cavity size was normal in size. There is moderate left ventricular hypertrophy. Left ventricular diastolic parameters are consistent with Grade I diastolic dysfunction (impaired relaxation). Indeterminate filling pressures.  Right Ventricle: The right ventricular size is normal. No increase in right ventricular wall thickness. Right ventricular systolic function is low normal.  Left Atrium: Left atrial size was moderately dilated.  Right Atrium: Right atrial size was normal in size.  Pericardium: There is no evidence of pericardial effusion.  Mitral Valve: The mitral valve is grossly normal. Mild mitral valve regurgitation.  Tricuspid Valve: The tricuspid valve is grossly normal. Tricuspid valve regurgitation is trivial.  Aortic Valve: The aortic valve is tricuspid. Aortic valve regurgitation is not visualized.  Pulmonic Valve: The pulmonic valve was abnormal. Pulmonic valve regurgitation is mild to moderate.  Aorta: Aortic dilatation noted. There is borderline dilatation of the aortic root, measuring 38 mm.  Venous: The inferior vena cava is normal in size with greater than 50% respiratory variability, suggesting right atrial pressure of 3 mmHg.  IAS/Shunts: No atrial level shunt detected by color flow Doppler.  Additional Comments: A device lead is visualized.   LEFT VENTRICLE PLAX 2D LVIDd:         4.80 cm         Diastology LVIDs:         3.80 cm         LV e' medial:    7.83 cm/s LV PW:         1.30  cm         LV E/e' medial:  13.7 LV IVS:        1.30 cm         LV e' lateral:   12.80 cm/s LVOT diam:     2.20 cm         LV E/e' lateral: 8.4 LV SV:         91 LV SV Index:   46 LVOT Area:     3.80 cm        3D Volume EF LV 3D EF:    Left ventricul ar ejection fraction by 3D volume is 42 %.  3D Volume EF: 3D EF:        42 % LV EDV:       201 ml LV ESV:       116 ml LV SV:        85 ml  RIGHT VENTRICLE RV Basal diam:  4.30 cm RV Mid diam:    3.30 cm RV S prime:     10.80 cm/s TAPSE (M-mode): 1.9 cm  LEFT ATRIUM              Index        RIGHT ATRIUM           Index LA diam:        5.00 cm  2.53 cm/m   RA Area:     21.00 cm LA Vol (A2C):   97.5 ml  49.35 ml/m  RA Volume:   61.90 ml  31.33 ml/m LA Vol (A4C):   101.0 ml 51.13 ml/m LA Biplane Vol: 100.0 ml 50.62 ml/m AORTIC VALVE LVOT Vmax:   85.40 cm/s LVOT Vmean:  59.600 cm/s LVOT VTI:    0.239 m  AORTA Ao Root diam: 3.80 cm Ao Asc diam:  3.40 cm  MITRAL VALVE MV Area (PHT):              SHUNTS MV Decel Time:              Systemic VTI:  0.24 m MV E velocity: 107.00 cm/s  Systemic Diam: 2.20 cm MV A velocity: 101.00 cm/s MV E/A ratio:  1.06  Zoila Shutter MD Electronically signed by Zoila Shutter MD Signature Date/Time: 10/27/2021/11:47:19 AM    Final             Other studies Reviewed: Chest CT 2018 demonstrates aortic atherosclerosis   Recent Labs: 10/26/2022: Pro B Natriuretic peptide (BNP) 616.0; TSH 4.13 11/14/2022: ALT 12 12/12/2022: Hemoglobin 12.3; Platelets 196 01/06/2023: BUN 15; Creatinine, Ser 1.03; Potassium 4.0; Sodium 140   Lipid Panel    Component Value Date/Time   CHOL 84 (L) 11/02/2022 1403   TRIG 84 11/02/2022 1403   HDL 34 (L) 11/02/2022 1403   CHOLHDL 2.5 11/02/2022 1403   CHOLHDL 3.2 02/18/2021 0835   VLDL 20 04/05/2016 0830   LDLCALC 33 11/02/2022 1403   LDLCALC 63 02/18/2021 0835    Risk Assessment/Calculations:     CHA2DS2-VASc Score = 5   This indicates a 7.2%  annual risk of stroke. The patient's score is based upon: CHF History: 1 HTN History: 1 Diabetes History: 0 Stroke History: 0 Vascular Disease History: 1 Age Score: 2 Gender Score: 0              Physical Exam:    VS:  BP 126/62   Pulse 81   Ht 5\' 9"  (1.753 m)   Wt 184 lb (83.5 kg)   SpO2 96%   BMI 27.17 kg/m    Wt Readings from Last 3 Encounters:  02/17/23 184 lb (83.5 kg)  02/07/23 186 lb (84.4 kg)  01/06/23 185 lb (83.9 kg)         GENERAL:  No apparent distress, AOx3 HEENT:  No carotid bruits, +2 carotid impulses, no scleral icterus CAR: RRR no murmurs, gallops, rubs, or thrills RES:  Clear to auscultation bilaterally ABD:  Soft, nontender, nondistended, positive bowel sounds x 4 VASC:  +2 radial pulses, +2 carotid pulses, palpable pedal pulses NEURO:  CN 2-12 grossly intact; motor and sensory grossly intact PSYCH:  No active depression or anxiety EXT:  No edema, ecchymosis, or cyanosis  Signed, [name], MD  02/17/2023 11:49 AM    [facility] [address] [place], [place], [place]  [address] Phone: [phone]; Fax: [phone]   Note:  This  document was prepared using Conservation officer, historic buildings and may include unintentional dictation errors.

## 2023-02-17 ENCOUNTER — Encounter: Payer: Self-pay | Admitting: Internal Medicine

## 2023-02-17 ENCOUNTER — Ambulatory Visit: Payer: Medicare HMO | Attending: Internal Medicine | Admitting: Internal Medicine

## 2023-02-17 VITALS — BP 126/62 | HR 81 | Ht 69.0 in | Wt 184.0 lb

## 2023-02-17 DIAGNOSIS — I2510 Atherosclerotic heart disease of native coronary artery without angina pectoris: Secondary | ICD-10-CM

## 2023-02-17 DIAGNOSIS — I779 Disorder of arteries and arterioles, unspecified: Secondary | ICD-10-CM

## 2023-02-17 DIAGNOSIS — E785 Hyperlipidemia, unspecified: Secondary | ICD-10-CM | POA: Diagnosis not present

## 2023-02-17 DIAGNOSIS — I255 Ischemic cardiomyopathy: Secondary | ICD-10-CM

## 2023-02-17 DIAGNOSIS — I4891 Unspecified atrial fibrillation: Secondary | ICD-10-CM | POA: Diagnosis not present

## 2023-02-17 DIAGNOSIS — I700 Atherosclerosis of aorta: Secondary | ICD-10-CM | POA: Diagnosis not present

## 2023-02-17 DIAGNOSIS — Z95810 Presence of automatic (implantable) cardiac defibrillator: Secondary | ICD-10-CM | POA: Diagnosis not present

## 2023-02-17 DIAGNOSIS — I483 Typical atrial flutter: Secondary | ICD-10-CM | POA: Diagnosis not present

## 2023-02-17 MED ORDER — FUROSEMIDE 20 MG PO TABS: 20.0000 mg | ORAL_TABLET | ORAL | 3 refills | Status: DC | PRN

## 2023-02-17 MED ORDER — EMPAGLIFLOZIN 25 MG PO TABS: 25.0000 mg | ORAL_TABLET | Freq: Every day | ORAL | 3 refills | Status: DC

## 2023-02-17 NOTE — Patient Instructions (Signed)
Medication Instructions:  Your physician has recommended you make the following change in your medication:   Take lasix 20 mg as needed \ Start Jardiance 25 mg daily   *If you need a refill on your cardiac medications before your next appointment, please call your pharmacy*  Testing/Procedures: ECHO Your physician has requested that you have an echocardiogram. Echocardiography is a painless test that uses sound waves to create images of your heart. It provides your doctor with information about the size and shape of your heart and how well your heart's chambers and valves are working. This procedure takes approximately one hour. There are no restrictions for this procedure. Please do NOT wear cologne, perfume, aftershave, or lotions (deodorant is allowed). Please arrive 15 minutes prior to your appointment time.  Follow-Up: At [facility], you and your health needs are our priority.  As part of our continuing mission to provide you with exceptional heart care, we have created designated Provider Care Teams.  These Care Teams include your primary Cardiologist (physician) and Advanced Practice Providers (APPs -  Physician Assistants and Nurse Practitioners) who all work together to provide you with the care you need, when you need it.   Your next appointment:   6 month(s)  Provider:   [name], MD

## 2023-02-17 NOTE — Addendum Note (Signed)
Addended by: Alvin Critchley A on: 02/17/2023 01:45 PM   Modules accepted: Orders

## 2023-02-21 ENCOUNTER — Other Ambulatory Visit: Payer: Self-pay | Admitting: Internal Medicine

## 2023-02-21 ENCOUNTER — Telehealth: Payer: Self-pay | Admitting: *Deleted

## 2023-02-21 MED ORDER — ASPIRIN 81 MG PO TBEC: 81.0000 mg | DELAYED_RELEASE_TABLET | Freq: Every day | ORAL | Status: AC

## 2023-02-21 NOTE — Telephone Encounter (Addendum)
-----   Message from [name] sent at 02/20/2023  6:34 AM EDT ----- Thanks Sharlot Gowda.  Sumner Boesch, let's stop Eliquis and start ASA 81.  Thanks, AT ----- Message ----- From: [name], MD Sent: 02/19/2023   8:45 PM EDT To: [name], MD  Ok to stop the eliquis. Close followup.  G _____________________________________________________________   Called patient and advised to stop Eliqius, begin aspirin 81 mg EC.  He voices understanding and agreement.  Medication list updated.

## 2023-02-24 ENCOUNTER — Other Ambulatory Visit: Payer: Self-pay

## 2023-02-24 ENCOUNTER — Inpatient Hospital Stay: Payer: Medicare HMO | Attending: Hematology and Oncology

## 2023-02-24 DIAGNOSIS — D649 Anemia, unspecified: Secondary | ICD-10-CM | POA: Insufficient documentation

## 2023-02-24 DIAGNOSIS — C9210 Chronic myeloid leukemia, BCR/ABL-positive, not having achieved remission: Secondary | ICD-10-CM | POA: Diagnosis not present

## 2023-02-24 LAB — COMPREHENSIVE METABOLIC PANEL
ALT: 9 U/L (ref 0–44)
AST: 11 U/L — ABNORMAL LOW (ref 15–41)
Albumin: 3.5 g/dL (ref 3.5–5.0)
Alkaline Phosphatase: 85 U/L (ref 38–126)
Anion gap: 5 (ref 5–15)
BUN: 17 mg/dL (ref 8–23)
CO2: 28 mmol/L (ref 22–32)
Calcium: 9.1 mg/dL (ref 8.9–10.3)
Chloride: 109 mmol/L (ref 98–111)
Creatinine, Ser: 0.99 mg/dL (ref 0.61–1.24)
GFR, Estimated: >60 mL/min (ref >60)
Glucose, Bld: 115 mg/dL — ABNORMAL HIGH (ref 70–99)
Potassium: 4.3 mmol/L (ref 3.5–5.1)
Sodium: 142 mmol/L (ref 135–145)
Total Bilirubin: 0.5 mg/dL (ref 0.3–1.2)
Total Protein: 6.8 g/dL (ref 6.5–8.1)

## 2023-02-24 LAB — CBC WITH DIFFERENTIAL/PLATELET
Abs Immature Granulocytes: 0.07 10*3/uL (ref 0.00–0.07)
Basophils Absolute: 0.1 10*3/uL (ref 0.0–0.1)
Basophils Relative: 1 %
Eosinophils Absolute: 0.2 10*3/uL (ref 0.0–0.5)
Eosinophils Relative: 3 %
HCT: 37 % — ABNORMAL LOW (ref 39.0–52.0)
Hemoglobin: 12.1 g/dL — ABNORMAL LOW (ref 13.0–17.0)
Immature Granulocytes: 1 %
Lymphocytes Relative: 13 %
Lymphs Abs: 0.9 10*3/uL (ref 0.7–4.0)
MCH: 30.9 pg (ref 26.0–34.0)
MCHC: 32.7 g/dL (ref 30.0–36.0)
MCV: 94.4 fL (ref 80.0–100.0)
Monocytes Absolute: 0.6 10*3/uL (ref 0.1–1.0)
Monocytes Relative: 8 %
Neutro Abs: 5.3 10*3/uL (ref 1.7–7.7)
Neutrophils Relative %: 74 %
Platelets: 230 10*3/uL (ref 150–400)
RBC: 3.92 MIL/uL — ABNORMAL LOW (ref 4.22–5.81)
RDW: 15.1 % (ref 11.5–15.5)
WBC: 7.1 10*3/uL (ref 4.0–10.5)
nRBC: 0 % (ref 0.0–0.2)

## 2023-02-27 ENCOUNTER — Ambulatory Visit (INDEPENDENT_AMBULATORY_CARE_PROVIDER_SITE_OTHER): Payer: Medicare HMO

## 2023-02-27 DIAGNOSIS — I255 Ischemic cardiomyopathy: Secondary | ICD-10-CM

## 2023-02-28 LAB — CUP PACEART REMOTE DEVICE CHECK
Battery Remaining Longevity: 26 mo
Battery Voltage: 2.95 V
Brady Statistic RV Percent Paced: 0.11 %
Date Time Interrogation Session: 20240722073725
HighPow Impedance: 59 Ohm
Implantable Lead Connection Status: 753985
Implantable Lead Implant Date: 20151014
Implantable Lead Location: 753860
Implantable Pulse Generator Implant Date: 20151014
Lead Channel Impedance Value: 361 Ohm
Lead Channel Impedance Value: 418 Ohm
Lead Channel Pacing Threshold Amplitude: 0.625 V
Lead Channel Pacing Threshold Pulse Width: 0.4 ms
Lead Channel Sensing Intrinsic Amplitude: 3.875 mV
Lead Channel Sensing Intrinsic Amplitude: 3.875 mV
Lead Channel Setting Pacing Amplitude: 2 V
Lead Channel Setting Pacing Pulse Width: 0.4 ms
Lead Channel Setting Sensing Sensitivity: 0.3 mV
Zone Setting Status: 755011

## 2023-03-02 NOTE — Progress Notes (Signed)
Anthony Wolfe  that wojld be great-- the issue is the high ( 50+%) likelihood, esp in an old guys ( high CHADSVAS) that he will develop afib Still I think ( and guidelines agree) taht it is reasonable to stop anticoagulation-- I often use sometime of home monitoring, -- pulse ox check, Kardia, apple watch on a daily basis to afford long term cheap surveillance Thanks SK

## 2023-03-07 ENCOUNTER — Encounter: Payer: Self-pay | Admitting: Hematology and Oncology

## 2023-03-07 ENCOUNTER — Other Ambulatory Visit: Payer: Self-pay

## 2023-03-07 ENCOUNTER — Inpatient Hospital Stay: Payer: Medicare HMO | Admitting: Hematology and Oncology

## 2023-03-07 VITALS — BP 136/61 | HR 81 | Temp 97.7°F | Resp 18 | Ht 69.0 in | Wt 187.2 lb

## 2023-03-07 DIAGNOSIS — D649 Anemia, unspecified: Secondary | ICD-10-CM | POA: Diagnosis not present

## 2023-03-07 DIAGNOSIS — C9210 Chronic myeloid leukemia, BCR/ABL-positive, not having achieved remission: Secondary | ICD-10-CM

## 2023-03-07 NOTE — Assessment & Plan Note (Signed)
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

## 2023-03-07 NOTE — Assessment & Plan Note (Signed)
This is likely anemia of chronic disease. The patient denies recent history of bleeding such as epistaxis, hematuria or hematochezia. He is asymptomatic from the anemia. We will observe for now.  He does not require transfusion now. I do not recommend any further work-up at this time.   

## 2023-03-07 NOTE — Progress Notes (Signed)
[facility] OFFICE PROGRESS NOTE  Patient Care Team: [name], MD as PCP - General (Family Medicine) [name], MD as PCP - Cardiology (Cardiology) [name], MD as PCP - Electrophysiology (Cardiology) [name], MD as Attending Physician (Cardiology) [name], MD (Family Medicine) [name], MD (Cardiothoracic Surgery) Early, [name], MD (Inactive) as Attending Physician (Vascular Surgery) [name], MD as Consulting Physician (Hematology and Oncology) Duke Salvia, MD as Consulting Physician (Cardiology) Erroll Luna, [facility] (Inactive) as Pharmacist (Pharmacist) Earlene Plater, Rita Ohara, [facility] (Inactive) as Pharmacist (Pharmacist) [facility]  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 The patient denies recent history of bleeding such as epistaxis, hematuria or hematochezia. He is asymptomatic from the anemia. We will observe for now.  He does not require transfusion now. I do not recommend any further work-up at this time.    No orders of the defined types were placed in this encounter.   All questions were answered. The patient knows to call the clinic with any problems, questions or concerns. The total time spent in the appointment was 20 minutes encounter with patients including review of chart and various tests results, discussions about plan of care and coordination of care plan   [name], MD 03/07/2023 10:54 AM  INTERVAL HISTORY: Please see below for problem oriented charting. he returns for treatment follow-up on Nilotinib He is here with his wife He is compliant taking his medications as directed Denies recent shortness of breath or chest  pain  REVIEW OF SYSTEMS:   Constitutional: Denies fevers, chills or abnormal weight loss Eyes: Denies blurriness of vision Ears, nose, mouth, throat, and face: Denies mucositis or sore throat Respiratory: Denies cough, dyspnea or wheezes Cardiovascular: Denies palpitation, chest discomfort or lower extremity swelling Gastrointestinal:  Denies nausea, heartburn or change in bowel habits Skin: Denies abnormal skin rashes Lymphatics: Denies new lymphadenopathy or easy bruising Neurological:Denies numbness, tingling or new weaknesses Behavioral/Psych: Mood is stable, no new changes  All other systems were reviewed with the patient and are negative.  I have reviewed the past medical history, past surgical history, social history and family history with the patient and they are unchanged from previous note.  ALLERGIES:  is allergic to breztri aerosphere [budeson-glycopyrrol-formoterol], norvasc [amlodipine besylate], and sulfa antibiotics.  MEDICATIONS:  Current Outpatient Medications  Medication Sig Dispense Refill   aspirin EC 81 MG tablet Take 1 tablet (81 mg total) by mouth daily. Swallow whole.     atorvastatin (LIPITOR) 80 MG tablet TAKE 1 TABLET BY MOUTH ONCE DAILY AT  6PM 90 tablet 2   carvedilol (COREG) 25 MG tablet Take 1 tablet (25 mg total) by mouth 2 (two) times daily with a meal. 180 tablet 3   diphenhydramine-acetaminophen (TYLENOL PM) 25-500 MG TABS tablet Take 1 tablet by mouth at bedtime as needed (sleep).     empagliflozin (JARDIANCE) 25 MG TABS tablet Take 1 tablet (25 mg total) by mouth daily before breakfast. 90 tablet 3   ezetimibe (ZETIA) 10 MG tablet Take 1 tablet by mouth once daily 90 tablet 2   furosemide (LASIX) 20 MG tablet Take 1 tablet (20 mg total) by mouth as needed. Take 1  tablet daily as needed for swelling. 90 tablet 3   meclizine (ANTIVERT) 25 MG tablet Take 25 mg by mouth 3 (three) times daily as needed for dizziness.     nilotinib (TASIGNA) 50 MG  capsule Take 2 capsules (100 mg total) by mouth every 12 (twelve) hours. Take on empty stomach, at least one hour before or two hours after food. (Patient taking differently: Take 50 mg by mouth every 12 (twelve) hours. Take on empty stomach, at least one hour before or two hours after food.) 360 capsule 0   nitroGLYCERIN (NITROSTAT) 0.4 MG SL tablet Place 0.4 mg under the tongue every 5 (five) minutes as needed for chest pain.     No current facility-administered medications for this visit.    SUMMARY OF ONCOLOGIC HISTORY: Oncology History  Chronic myelogenous leukemia (HCC)  05/04/2012 Bone Marrow Biopsy   BM confirmed diagnosis of CML in Chronic phase. BCR/ABL by PCR detected abnormalitis with b2a2 & b3a2 subtypes   05/09/2012 - 03/16/2015 Chemotherapy   He was started on treatment with Dasatinib 100 mg daily   06/06/2012 Adverse Reaction   Dose of medication was reduced to 50 mg daily due to pancytopenia   01/24/2013 Progression   Patient was noted to have elevated blood count which and was subsequently found to be noncompliant to treatment. The patient has not been on treatment for several months because his prescription ran out. He was restarted back on treatment   01/16/2014 Progression   Bcr/ABL by PCR is worse. Dose of Dasatinib was increased to 100 mg.   02/26/2014 Tumor Marker   Blood work for ABL kinase mutation was negative.   10/29/2014 Tumor Marker   BCR/ABL b2a2 & b3a2 0.29%, IS 0.1624%, not in MMR yet but improving   11/05/2014 Adverse Reaction   He had thoracentesis due to pleural effusion   11/27/2014 Surgery   He underwent right video-assisted thoracoscopy, Drainage of pleural effusion, Pleural biopsy, Diaphragm biopsy, Lung biopsy & Talc pleurodesis   01/30/2015 Tumor Marker   BCR/ABL b2a2 & b3a2 0.78%, IS 0.4368%, not in MMR    03/11/2015 Pathology Results   BCR/ABL b2a2 0.19%, IS 0.1064%, not in MMR    03/16/2015 Adverse Reaction   Dasatinib was stopped due to  recurrent pleural effusion   03/28/2015 - 11/06/2017 Chemotherapy   He started on Bosutinib   03/30/2015 Procedure   He has therapeutic thoracentesis of the right lung with 1 liter of fluid removed   03/30/2015 Adverse Reaction   Bosutinib is placed on hold, to be restart on 8/25 at 250 mg due to severe diarrhea   04/28/2015 Tumor Marker   BCR/ABL b2a2 0.06%, IS 0.0336%, in MMR    06/10/2015 Tumor Marker   BCR/ABL b2a2 0.14%, IS 0.1162%, not in MMR    08/14/2015 Pathology Results   BCR/ABL b2a2 0.02%, IS 0.0166%, In MMR    11/09/2015 Tumor Marker   BCR/ABL b2a2 0.005%, IS 0.0042%, In MMR    02/08/2016 Tumor Marker   BCR/ABL undetectable. In MMR   06/13/2016 Tumor Marker   BCR/ABL undetectable. In MMR   12/06/2016 Pathology Results   BCR/ABL undetectable. In MMR   06/08/2017 Pathology Results   BCR/ABL undetectable. In MMR   09/08/2017 Pathology Results   BCR/ABL undetectable. In MMR   12/11/2017 Pathology Results   BCR/ABL undetectable. In MMR   03/19/2018 Pathology Results   BCR/ABL undetectable. In MMR   06/12/2018 Pathology Results   BCR/ABL undetectable. In MMR   09/06/2018 Pathology  Results   BCR ABL is detectable. e13a2 (b2a2) and [id number]) IS: 0.009%   09/26/2018 -  Chemotherapy   The patient had Tasigna    11/21/2018 Pathology Results   BCR/ABL undetectable. In MMR   03/07/2019 Pathology Results   BCR/ABL undetectable. In MMR   06/17/2019 Pathology Results   BCR/ABL undetectable. In MMR   10/14/2019 Pathology Results   BCR ABL is detectable. e13a2 (b2a2) and U13K4(M0N0) IS: 0.0281%. In MMR   01/14/2020 Pathology Results   BCR ABL is detectable. e13a2 (b2a2) and U72Z3(G6Y4) IS: 0.023%. In MMR   10/23/2020 Pathology Results   BCR ABL is detectable. e13a2 (b2a2) and I34V4(Q5Z5) IS: 0.0643%. In MMR   01/22/2021 Pathology Results   BCR ABL is detectable. e13a2 (b2a2) and G38V5(I4P3) IS: 0.027%. In MMR   05/25/2021 Pathology Results   BCR ABL is detectable. e13a2 (b2a2)  and I95J8(A4Z6) IS: 0.0091%. In MMR   08/25/2021 Pathology Results   BCR ABL is detectable. e13a2 (b2a2) and S06T0(Z6W1) IS: 0.0039%. In MMR   12/13/2021 Pathology Results   BCR/ABL undetectable. In MMR   03/15/2022 Pathology Results   BCR/ABL undetectable. In MMR   05/16/2022 Pathology Results   BCR/ABL undetectable. In MMR     08/17/2022 Pathology Results   BCR/ABL undetectable. In MMR    11/14/2022 Pathology Results   BCR/ABL undetectable. In MMR      02/24/2023 Pathology Results   BCR/ABL undetectable. In MMR         PHYSICAL EXAMINATION: ECOG PERFORMANCE STATUS: 0 - Asymptomatic  Vitals:   03/07/23 0845  BP: 136/61  Pulse: 81  Resp: 18  Temp: 97.7 F (36.5 C)  SpO2: 100%   Filed Weights   03/07/23 0845  Weight: 187 lb 3.2 oz (84.9 kg)    GENERAL:alert, no distress and comfortable SKIN: skin color, texture, turgor are normal, no rashes or significant lesions EYES: normal, Conjunctiva are pink and non-injected, sclera clear OROPHARYNX:no exudate, no erythema and lips, buccal mucosa, and tongue normal  NECK: supple, thyroid normal size, non-tender, without nodularity LYMPH:  no palpable lymphadenopathy in the cervical, axillary or inguinal LUNGS: clear to auscultation and percussion with normal breathing effort HEART: regular rate & rhythm and no murmurs and no lower extremity edema ABDOMEN:abdomen soft, non-tender and normal bowel sounds Musculoskeletal:no cyanosis of digits and no clubbing  NEURO: alert & oriented x 3 with fluent speech, no focal motor/sensory deficits  LABORATORY DATA:  I have reviewed the data as listed    Component Value Date/Time   NA 142 02/24/2023 0849   NA 142 11/02/2022 1403   NA 142 06/08/2017 0832   K 4.3 02/24/2023 0849   K 3.9 06/08/2017 0832   CL 109 02/24/2023 0849   CL 110 (H) 11/14/2012 0918   CO2 28 02/24/2023 0849   CO2 25 06/08/2017 0832   GLUCOSE 115 (H) 02/24/2023 0849   GLUCOSE 98 06/08/2017 0832   GLUCOSE 108  (H) 11/14/2012 0918   BUN 17 02/24/2023 0849   BUN 21 11/02/2022 1403   BUN 11.6 06/08/2017 0832   CREATININE 0.99 02/24/2023 0849   CREATININE 1.13 01/22/2021 0840   CREATININE 0.90 05/31/2019 0837   CREATININE 0.9 06/08/2017 0832   CALCIUM 9.1 02/24/2023 0849   CALCIUM 8.6 06/08/2017 0832   PROT 6.8 02/24/2023 0849   PROT 6.5 11/02/2022 1403   PROT 6.7 06/08/2017 0832   ALBUMIN 3.5 02/24/2023 0849   ALBUMIN 3.7 (L) 11/02/2022 1403   ALBUMIN 3.4 (L) 06/08/2017 0932  AST 11 (L) 02/24/2023 0849   AST 13 (L) 01/22/2021 0840   AST 13 06/08/2017 0832   ALT 9 02/24/2023 0849   ALT 14 01/22/2021 0840   ALT 7 06/08/2017 0832   ALKPHOS 85 02/24/2023 0849   ALKPHOS 96 06/08/2017 0832   BILITOT 0.5 02/24/2023 0849   BILITOT 0.6 11/02/2022 1403   BILITOT 0.3 01/22/2021 0840   BILITOT 0.44 06/08/2017 0832   GFRNONAA >60 02/24/2023 0849   GFRNONAA >60 01/22/2021 0840   GFRNONAA 83 05/31/2019 0837   GFRAA >60 04/24/2020 0842   GFRAA 96 05/31/2019 0837    No results found for: "SPEP", "UPEP"  Lab Results  Component Value Date   WBC 7.1 02/24/2023   NEUTROABS 5.3 02/24/2023   HGB 12.1 (L) 02/24/2023   HCT 37.0 (L) 02/24/2023   MCV 94.4 02/24/2023   PLT 230 02/24/2023      Chemistry      Component Value Date/Time   NA 142 02/24/2023 0849   NA 142 11/02/2022 1403   NA 142 06/08/2017 0832   K 4.3 02/24/2023 0849   K 3.9 06/08/2017 0832   CL 109 02/24/2023 0849   CL 110 (H) 11/14/2012 0918   CO2 28 02/24/2023 0849   CO2 25 06/08/2017 0832   BUN 17 02/24/2023 0849   BUN 21 11/02/2022 1403   BUN 11.6 06/08/2017 0832   CREATININE 0.99 02/24/2023 0849   CREATININE 1.13 01/22/2021 0840   CREATININE 0.90 05/31/2019 0837   CREATININE 0.9 06/08/2017 0832      Component Value Date/Time   CALCIUM 9.1 02/24/2023 0849   CALCIUM 8.6 06/08/2017 0832   ALKPHOS 85 02/24/2023 0849   ALKPHOS 96 06/08/2017 0832   AST 11 (L) 02/24/2023 0849   AST 13 (L) 01/22/2021 0840   AST 13  06/08/2017 0832   ALT 9 02/24/2023 0849   ALT 14 01/22/2021 0840   ALT 7 06/08/2017 0832   BILITOT 0.5 02/24/2023 0849   BILITOT 0.6 11/02/2022 1403   BILITOT 0.3 01/22/2021 0840   BILITOT 0.44 06/08/2017 1610

## 2023-03-09 NOTE — Progress Notes (Signed)
Remote ICD transmission.   

## 2023-03-10 ENCOUNTER — Ambulatory Visit (INDEPENDENT_AMBULATORY_CARE_PROVIDER_SITE_OTHER): Payer: Medicare HMO | Admitting: Family Medicine

## 2023-03-10 ENCOUNTER — Encounter: Payer: Self-pay | Admitting: Family Medicine

## 2023-03-10 VITALS — BP 118/70 | HR 64 | Temp 98.0°F | Ht 69.0 in | Wt 184.8 lb

## 2023-03-10 DIAGNOSIS — I6523 Occlusion and stenosis of bilateral carotid arteries: Secondary | ICD-10-CM | POA: Diagnosis not present

## 2023-03-10 DIAGNOSIS — I5020 Unspecified systolic (congestive) heart failure: Secondary | ICD-10-CM

## 2023-03-10 DIAGNOSIS — J449 Chronic obstructive pulmonary disease, unspecified: Secondary | ICD-10-CM

## 2023-03-10 DIAGNOSIS — Z8679 Personal history of other diseases of the circulatory system: Secondary | ICD-10-CM | POA: Diagnosis not present

## 2023-03-10 DIAGNOSIS — I4891 Unspecified atrial fibrillation: Secondary | ICD-10-CM | POA: Diagnosis not present

## 2023-03-10 NOTE — Progress Notes (Signed)
Subjective:    Patient ID: Anthony Wolfe, male    DOB: 11/05/1945, 77 y.o.   MRN: [id number]  Patient is a very pleasant 77 year old Caucasian gentleman with a history of coronary artery disease status post myocardial infarction, congestive heart failure with reduced ejection fraction, paroxysmal atrial fibrillation, history of bilateral carotid artery stenosis, COPD, and CML.  Had carotid doppler in 2/24: IMPRESSION: 1. Postoperative change of the right carotid bulb with residual/recurrent elevated peak systolic velocities within the mid aspect of the right internal carotid artery compatible with a 50-69% luminal narrowing range, though note, velocity measurements are unreliable in the setting of a contralateral occlusion. Further evaluation with CTA could performed as clinically indicated. 2. Chronic occlusion of the left internal carotid artery. 3. Preserved antegrade flow within the bilateral vertebral arteries.  Patient has some mild orthostatic dizziness when he stands up quickly but is otherwise doing well.  He denies any chest pain.  He denies any orthopnea.  He denies any paroxysmal nocturnal dyspnea.  He does have some shortness of breath when he is working outside.  History of PE but is currently not on any close walking bronchodilator.  He stopped them due to thrush.  Recently saw his oncologist.  He is currently still in remission.  I reviewed his CBC and his CMP.  He is due to recheck his cholesterol Past Medical History:  Diagnosis Date   AICD (automatic cardioverter/defibrillator) present    Allergic rhinitis    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 difficile colitis 06/16/2014   CAD (coronary artery disease)    a. s/p 3v CABG 2010. b. NSTEMI 05/2014 in setting of VT. Cath impressions: "Recent IMI with occluded SVG to RCA. Has Right to Right collaterals and collaterals from septal and OM. EF 40%."   Carotid disease,  bilateral (HCC)    a. Right CEA 2002; known occluded left carotid. b. Duplex 06/2014: known occluded LICA, stable 1-39% RICA s/p CEA.   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 Prior EF 36%. b. 2014: 50%. c. 05/2014: EF 35-40% by echo, 40% with inf HK by cath.   NSTEMI (non-ST elevation myocardial infarction) (HCC) 05/08/2014   Pleural effusion 05/08/2014   Pneumonia 05/23/2014   Presence of permanent cardiac pacemaker    Prostate hypertrophy    PVC's (premature ventricular contractions)    a. PVC's with prior Holter showing PVC load of 22%.   Shortness of breath dyspnea    Stroke (HCC) 08/09/1999   TIA (transient ischemic attack)    ASPVD, S./P. right CEA, 2002, and known occluded left carotid   Ventricular tachycardia (HCC)    a. Admitted with VT 05/2013 - EPS with inducible sustained monomorphic VT; s/p Medtronic ICD 05/21/14.   Past Surgical History:  Procedure Laterality Date   A-FLUTTER ABLATION N/A 01/06/2023   Procedure: A-FLUTTER ABLATION;  Surgeon: [name], MD;  Location: [facility] INVASIVE CV LAB;  Service: Cardiovascular;  Laterality: N/A;   BACK SURGERY     CARDIOVERSION N/A 01/04/2022   Procedure: CARDIOVERSION;  Surgeon: Parke Poisson, MD;  Location: [facility] ENDOSCOPY;  Service: Cardiovascular;  Laterality: N/A;   CARDIOVERSION N/A 11/24/2022  Procedure: CARDIOVERSION;  Surgeon: Thomasene Ripple, DO;  Location: [facility] INVASIVE CV LAB;  Service: Cardiovascular;  Laterality: N/A;   CAROTID ENDARTERECTOMY Right 01   carotidectomy  2003   right side   COLONOSCOPY  07/2010   CORONARY ARTERY BYPASS GRAFT  02/2009   3 vessel   ELECTROPHYSIOLOGIC STUDY  05/21/14   ELECTROPHYSIOLOGY STUDY N/A 05/21/2014   Procedure: ELECTROPHYSIOLOGY STUDY;  Surgeon: Duke Salvia, MD;   Location: [facility] CATH LAB;  Service: Cardiovascular;  Laterality: N/A;   EP IMPLANTABLE DEVICE  05/21/14   single chamber Metronic ICD   IR THORACENTESIS ASP PLEURAL SPACE W/IMG GUIDE  12/07/2021   LEFT HEART CATHETERIZATION WITH CORONARY/GRAFT ANGIOGRAM N/A 05/19/2014   Procedure: LEFT HEART CATHETERIZATION WITH Isabel Caprice;  Surgeon: Wendall Stade, MD;  Location: [facility] CATH LAB;  Service: Cardiovascular;  Laterality: N/A;   PLEURAL BIOPSY N/A 11/27/2014   Procedure: PLEURAL BIOPSY;  Surgeon: [name], MD;  Location[facility] OR;  Service: Thoracic;  Laterality: N/A;   PLEURAL EFFUSION DRAINAGE Right 11/27/2014   Procedure: DRAINAGE OF PLEURAL EFFUSION;  Surgeon: [name], MD;  Location: [facility] OR;  Service: Thoracic;  Laterality: Right;   TALC PLEURODESIS Right 11/27/2014   Procedure: Lurlean Nanny;  Surgeon: [name], MD;  Location: [facility] OR;  Service: Thoracic;  Laterality: Right;   VIDEO ASSISTED THORACOSCOPY Right 11/27/2014   Procedure: RIGHT VIDEO ASSISTED THORACOSCOPY;  Surgeon: [name], MD;  Location: [facility] OR;  Service: Thoracic;  Laterality: Right;   Current Outpatient Medications on File Prior to Visit  Medication Sig Dispense Refill   aspirin EC 81 MG tablet Take 1 tablet (81 mg total) by mouth daily. Swallow whole.     atorvastatin (LIPITOR) 80 MG tablet TAKE 1 TABLET BY MOUTH ONCE DAILY AT  6PM 90 tablet 2   carvedilol (COREG) 25 MG tablet Take 1 tablet (25 mg total) by mouth 2 (two) times daily with a meal. 180 tablet 3   diphenhydramine-acetaminophen (TYLENOL PM) 25-500 MG TABS tablet Take 1 tablet by mouth at bedtime as needed (sleep).     empagliflozin (JARDIANCE) 25 MG TABS tablet Take 1 tablet (25 mg total) by mouth daily before breakfast. 90 tablet 3   ezetimibe (ZETIA) 10 MG tablet Take 1 tablet by mouth once daily 90 tablet 2   furosemide (LASIX) 20 MG tablet Take 1 tablet (20 mg total) by mouth as needed. Take 1 tablet daily as  needed for swelling. 90 tablet 3   meclizine (ANTIVERT) 25 MG tablet Take 25 mg by mouth 3 (three) times daily as needed for dizziness.     nilotinib (TASIGNA) 50 MG capsule Take 2 capsules (100 mg total) by mouth every 12 (twelve) hours. Take on empty stomach, at least one hour before or two hours after food. (Patient taking differently: Take 50 mg by mouth every 12 (twelve) hours. Take on empty stomach, at least one hour before or two hours after food.) 360 capsule 0   nitroGLYCERIN (NITROSTAT) 0.4 MG SL tablet Place 0.4 mg under the tongue every 5 (five) minutes as needed for chest pain.     No current facility-administered medications on file prior to visit.   Allergies  Allergen Reactions   Breztri Aerosphere [Budeson-Glycopyrrol-Formoterol] Swelling    Swelling of mouth tongue    Norvasc [Amlodipine Besylate] Rash    rash   Sulfa Antibiotics Hives    Hives    Social History   Socioeconomic History   Marital  status: Married    Spouse name: Not on file   Number of children: 2   Years of education: Not on file   Highest education level: Not on file  Occupational History    Comment: retired Administrator  Tobacco Use   Smoking status: Former    Current packs/day: 0.00    Average packs/day: 3.0 packs/day for 45.0 years (135.0 ttl pk-yrs)    Types: Cigarettes    Start date: 08/08/1952    Quit date: 08/08/1997    Years since quitting: 25.6    Passive exposure: Past   Smokeless tobacco: Former  Building services engineer status: Never Used  Substance and Sexual Activity   Alcohol use: No    Alcohol/week: 0.0 standard drinks of alcohol   Drug use: No   Sexual activity: Yes  Other Topics Concern   Not on file  Social History Narrative   Not on file   Social Determinants of Health   Financial Resource Strain: Low Risk  (11/17/2022)   Overall Financial Resource Strain (CARDIA)    Difficulty of Paying Living Expenses: Not hard at all  Food Insecurity: No Food Insecurity (11/17/2022)    Hunger Vital Sign    Worried About Running Out of Food in the Last Year: Never true    Ran Out of Food in the Last Year: Never true  Transportation Needs: No Transportation Needs (11/17/2022)   PRAPARE - Administrator, Civil Service (Medical): No    Lack of Transportation (Non-Medical): No  Physical Activity: Insufficiently Active (11/11/2021)   Exercise Vital Sign    Days of Exercise per Week: 3 days    Minutes of Exercise per Session: 30 min  Stress: No Stress Concern Present (11/17/2022)   [employer] of Occupational Health - Occupational Stress Questionnaire    Feeling of Stress : Not at all  Social Connections: Socially Integrated (11/17/2022)   Social Connection and Isolation Panel [NHANES]    Frequency of Communication with Friends and Family: More than three times a week    Frequency of Social Gatherings with Friends and Family: More than three times a week    Attends Religious Services: More than 4 times per year    Active Member of [employer] or Organizations: Yes    Attends Banker Meetings: More than 4 times per year    Marital Status: Married  Catering manager Violence: Not At Risk (11/17/2022)   Humiliation, Afraid, Rape, and Kick questionnaire    Fear of Current or Ex-Partner: No    Emotionally Abused: No    Physically Abused: No    Sexually Abused: No      Review of Systems  Gastrointestinal:  Positive for diarrhea.  All other systems reviewed and are negative.      Objective:   Physical Exam Vitals reviewed.  Constitutional:      General: He is not in acute distress.    Appearance: Normal appearance. He is well-developed. He is not ill-appearing, toxic-appearing or diaphoretic.  Cardiovascular:     Rate and Rhythm: Normal rate. Rhythm irregularly irregular.     Heart sounds: Normal heart sounds. No murmur heard. Pulmonary:     Effort: Pulmonary effort is normal. No tachypnea, accessory muscle usage, prolonged expiration or  respiratory distress.     Breath sounds: No wheezing or rales.  Abdominal:     General: Bowel sounds are normal. There is no distension.     Palpations: Abdomen is soft. There is no  mass.     Tenderness: There is no abdominal tenderness. There is no guarding or rebound.  Musculoskeletal:     Right lower leg: No edema.     Left lower leg: No edema.  Neurological:     Mental Status: He is alert.           Assessment & Plan:  Chronic obstructive pulmonary disease, unspecified COPD type (HCC)  Heart failure with reduced ejection fraction (HCC) - Plan: Lipid panel  Bilateral carotid artery stenosis - Plan: Lipid panel  Atrial fibrillation, unspecified type (HCC)  History of ASCVD - Plan: Lipid panel Right-sided carotid artery stenosis is stable.  Due to repeat checking this in March of next year.  Recommended standing up slowly to avoid orthostatic dizziness.  No evidence of fluid overload on exam today and blood pressure is excellent.  I believe his shortness of breath with activity is multifactorial but I believe some is due to untreated COPD.  I gave the patient samples of Breztri 2 puffs twice daily to try and see if this helps.  Recommended that he wash his mouth out with water after every dose.  Check fasting lipid panel.  Goal LDL cholesterol is less than 55

## 2023-03-14 ENCOUNTER — Ambulatory Visit (HOSPITAL_COMMUNITY): Payer: Medicare HMO | Attending: Internal Medicine

## 2023-03-14 DIAGNOSIS — I4891 Unspecified atrial fibrillation: Secondary | ICD-10-CM | POA: Insufficient documentation

## 2023-03-14 DIAGNOSIS — I2510 Atherosclerotic heart disease of native coronary artery without angina pectoris: Secondary | ICD-10-CM | POA: Diagnosis not present

## 2023-03-14 DIAGNOSIS — I483 Typical atrial flutter: Secondary | ICD-10-CM | POA: Diagnosis not present

## 2023-03-14 DIAGNOSIS — I255 Ischemic cardiomyopathy: Secondary | ICD-10-CM | POA: Insufficient documentation

## 2023-03-14 LAB — ECHOCARDIOGRAM COMPLETE
Area-P 1/2: 3.7 cm2
S' Lateral: 4.2 cm

## 2023-05-25 ENCOUNTER — Telehealth: Payer: Self-pay | Admitting: Pharmacy Technician

## 2023-05-25 NOTE — Telephone Encounter (Signed)
Oral Oncology Patient Advocate Encounter   Received notification that patient is due for re-enrollment for assistance for Tasigna through [employer].   Re-enrollment process has been initiated and will be submitted upon completion of necessary documents.  Patient to sign on 05/29/23.  [employer] phone number [phone].   I will continue to follow until final determination.  Jinger Neighbors, CPhT-Adv Oncology Pharmacy Patient Advocate [facility] Direct Number: [phone]  Fax: [phone]

## 2023-05-29 ENCOUNTER — Ambulatory Visit (INDEPENDENT_AMBULATORY_CARE_PROVIDER_SITE_OTHER): Payer: Medicare HMO

## 2023-05-29 ENCOUNTER — Other Ambulatory Visit (HOSPITAL_COMMUNITY): Payer: Self-pay

## 2023-05-29 ENCOUNTER — Encounter: Payer: Self-pay | Admitting: Family Medicine

## 2023-05-29 ENCOUNTER — Inpatient Hospital Stay: Payer: Medicare HMO | Attending: Hematology and Oncology

## 2023-05-29 ENCOUNTER — Ambulatory Visit: Payer: Medicare HMO | Admitting: Family Medicine

## 2023-05-29 VITALS — BP 130/80 | HR 81 | Temp 98.5°F | Ht 69.0 in | Wt 189.0 lb

## 2023-05-29 DIAGNOSIS — M722 Plantar fascial fibromatosis: Secondary | ICD-10-CM

## 2023-05-29 DIAGNOSIS — I255 Ischemic cardiomyopathy: Secondary | ICD-10-CM

## 2023-05-29 DIAGNOSIS — D649 Anemia, unspecified: Secondary | ICD-10-CM | POA: Insufficient documentation

## 2023-05-29 DIAGNOSIS — C9210 Chronic myeloid leukemia, BCR/ABL-positive, not having achieved remission: Secondary | ICD-10-CM | POA: Insufficient documentation

## 2023-05-29 LAB — COMPREHENSIVE METABOLIC PANEL
ALT: 13 U/L (ref 0–44)
AST: 15 U/L (ref 15–41)
Albumin: 3.7 g/dL (ref 3.5–5.0)
Alkaline Phosphatase: 85 U/L (ref 38–126)
Anion gap: 7 (ref 5–15)
BUN: 15 mg/dL (ref 8–23)
CO2: 27 mmol/L (ref 22–32)
Calcium: 9 mg/dL (ref 8.9–10.3)
Chloride: 110 mmol/L (ref 98–111)
Creatinine, Ser: 0.97 mg/dL (ref 0.61–1.24)
GFR, Estimated: >60 mL/min (ref >60)
Glucose, Bld: 99 mg/dL (ref 70–99)
Potassium: 4.4 mmol/L (ref 3.5–5.1)
Sodium: 144 mmol/L (ref 135–145)
Total Bilirubin: 0.7 mg/dL (ref 0.3–1.2)
Total Protein: 6.4 g/dL — ABNORMAL LOW (ref 6.5–8.1)

## 2023-05-29 LAB — CBC WITH DIFFERENTIAL/PLATELET
Abs Immature Granulocytes: 0.03 10*3/uL (ref 0.00–0.07)
Basophils Absolute: 0.1 10*3/uL (ref 0.0–0.1)
Basophils Relative: 1 %
Eosinophils Absolute: 0.2 10*3/uL (ref 0.0–0.5)
Eosinophils Relative: 2 %
HCT: 36.5 % — ABNORMAL LOW (ref 39.0–52.0)
Hemoglobin: 12.1 g/dL — ABNORMAL LOW (ref 13.0–17.0)
Immature Granulocytes: 0 %
Lymphocytes Relative: 13 %
Lymphs Abs: 1 10*3/uL (ref 0.7–4.0)
MCH: 32 pg (ref 26.0–34.0)
MCHC: 33.2 g/dL (ref 30.0–36.0)
MCV: 96.6 fL (ref 80.0–100.0)
Monocytes Absolute: 0.6 10*3/uL (ref 0.1–1.0)
Monocytes Relative: 8 %
Neutro Abs: 5.5 10*3/uL (ref 1.7–7.7)
Neutrophils Relative %: 76 %
Platelets: 174 10*3/uL (ref 150–400)
RBC: 3.78 MIL/uL — ABNORMAL LOW (ref 4.22–5.81)
RDW: 14.1 % (ref 11.5–15.5)
WBC: 7.3 10*3/uL (ref 4.0–10.5)
nRBC: 0 % (ref 0.0–0.2)

## 2023-05-29 NOTE — Progress Notes (Signed)
Subjective:    Patient ID: Anthony Wolfe, male    DOB: 04-Jan-1946, 77 y.o.   MRN: [id number]  Patient complains of pain in his right foot for the last 3 weeks.  The pain is like insertion of the plantar fascia on the plantar surface of the calcaneus.  It hurts to walk.  Feels like someone stabbing him in the foot when he walks.  He states that the injury occurred when he was walking up a hill.  He felt a pop in his foot.  Today he has tenderness to palpation over the plantar surface of the calcaneus.  There is no erythema or swelling. 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 difficile colitis 06/16/2014   CAD (coronary artery disease)    a. s/p 3v CABG 2010. b. NSTEMI 05/2014 in setting of VT. Cath impressions: "Recent IMI with occluded SVG to RCA. Has Right to Right collaterals and collaterals from septal and OM. EF 40%."   Carotid disease, bilateral (HCC)    a. Right CEA 2002; known occluded left carotid. b. Duplex 06/2014: known occluded LICA, stable 1-39% RICA s/p CEA.   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 Prior EF 36%. b. 2014: 50%. c. 05/2014: EF 35-40% by echo, 40% with inf HK by cath.   NSTEMI (non-ST elevation myocardial infarction) (HCC) 05/08/2014   Pleural effusion 05/08/2014   Pneumonia 05/23/2014   Presence of permanent cardiac pacemaker    Prostate hypertrophy    PVC's (premature ventricular contractions)    a. PVC's with prior Holter showing PVC load of 22%.   Shortness of breath dyspnea    Stroke (HCC) 08/09/1999   TIA (transient ischemic attack)    ASPVD, S./P. right CEA, 2002, and known  occluded left carotid   Ventricular tachycardia (HCC)    a. Admitted with VT 05/2013 - EPS with inducible sustained monomorphic VT; s/p Medtronic ICD 05/21/14.   Past Surgical History:  Procedure Laterality Date   A-FLUTTER ABLATION N/A 01/06/2023   Procedure: A-FLUTTER ABLATION;  Surgeon: [name], MD;  Location: [facility] INVASIVE CV LAB;  Service: Cardiovascular;  Laterality: N/A;   BACK SURGERY     CARDIOVERSION N/A 01/04/2022   Procedure: CARDIOVERSION;  Surgeon: Parke Poisson, MD;  Location: [facility] ENDOSCOPY;  Service: Cardiovascular;  Laterality: N/A;   CARDIOVERSION N/A 11/24/2022   Procedure: CARDIOVERSION;  Surgeon: Thomasene Ripple, DO;  Location: [facility] INVASIVE CV LAB;  Service: Cardiovascular;  Laterality: N/A;   CAROTID ENDARTERECTOMY Right 01   carotidectomy  2003   right side   COLONOSCOPY  07/2010   CORONARY ARTERY BYPASS GRAFT  02/2009   3 vessel   ELECTROPHYSIOLOGIC STUDY  05/21/14   ELECTROPHYSIOLOGY STUDY N/A 05/21/2014   Procedure: ELECTROPHYSIOLOGY STUDY;  Surgeon: Duke Salvia, MD;  Location: [facility] CATH LAB;  Service: Cardiovascular;  Laterality: N/A;   EP IMPLANTABLE DEVICE  05/21/14   single chamber Metronic ICD   IR THORACENTESIS ASP PLEURAL SPACE W/IMG GUIDE  12/07/2021   LEFT HEART CATHETERIZATION WITH CORONARY/GRAFT ANGIOGRAM N/A 05/19/2014  Procedure: LEFT HEART CATHETERIZATION WITH Isabel Caprice;  Surgeon: Wendall Stade, MD;  Location: [facility] CATH LAB;  Service: Cardiovascular;  Laterality: N/A;   PLEURAL BIOPSY N/A 11/27/2014   Procedure: PLEURAL BIOPSY;  Surgeon: [name], MD;  Location: [facility] OR;  Service: Thoracic;  Laterality: N/A;   PLEURAL EFFUSION DRAINAGE Right 11/27/2014   Procedure: DRAINAGE OF PLEURAL EFFUSION;  Surgeon: [name], MD;  Location: [facility] OR;  Service: Thoracic;  Laterality: Right;   TALC PLEURODESIS Right 11/27/2014   Procedure: Lurlean Nanny;  Surgeon: [name], MD;  Location: [facility] OR;  Service:  Thoracic;  Laterality: Right;   VIDEO ASSISTED THORACOSCOPY Right 11/27/2014   Procedure: RIGHT VIDEO ASSISTED THORACOSCOPY;  Surgeon: [name], MD;  Location: [facility] OR;  Service: Thoracic;  Laterality: Right;   Current Outpatient Medications on File Prior to Visit  Medication Sig Dispense Refill   aspirin EC 81 MG tablet Take 1 tablet (81 mg total) by mouth daily. Swallow whole.     atorvastatin (LIPITOR) 80 MG tablet TAKE 1 TABLET BY MOUTH ONCE DAILY AT  6PM 90 tablet 2   carvedilol (COREG) 25 MG tablet Take 1 tablet (25 mg total) by mouth 2 (two) times daily with a meal. 180 tablet 3   diphenhydramine-acetaminophen (TYLENOL PM) 25-500 MG TABS tablet Take 1 tablet by mouth at bedtime as needed (sleep).     empagliflozin (JARDIANCE) 25 MG TABS tablet Take 1 tablet (25 mg total) by mouth daily before breakfast. 90 tablet 3   ezetimibe (ZETIA) 10 MG tablet Take 1 tablet by mouth once daily 90 tablet 2   meclizine (ANTIVERT) 25 MG tablet Take 25 mg by mouth 3 (three) times daily as needed for dizziness.     nilotinib (TASIGNA) 50 MG capsule Take 2 capsules (100 mg total) by mouth every 12 (twelve) hours. Take on empty stomach, at least one hour before or two hours after food. (Patient taking differently: Take 50 mg by mouth every 12 (twelve) hours. Take on empty stomach, at least one hour before or two hours after food.) 360 capsule 0   nitroGLYCERIN (NITROSTAT) 0.4 MG SL tablet Place 0.4 mg under the tongue every 5 (five) minutes as needed for chest pain.     furosemide (LASIX) 20 MG tablet Take 1 tablet (20 mg total) by mouth as needed. Take 1 tablet daily as needed for swelling. 90 tablet 3   No current facility-administered medications on file prior to visit.   Allergies  Allergen Reactions   Breztri Aerosphere [Budeson-Glycopyrrol-Formoterol] Swelling    Swelling of mouth tongue    Norvasc [Amlodipine Besylate] Rash    rash   Sulfa Antibiotics Hives    Hives    Social History    Socioeconomic History   Marital status: Married    Spouse name: Not on file   Number of children: 2   Years of education: Not on file   Highest education level: Not on file  Occupational History    Comment: retired Administrator  Tobacco Use   Smoking status: Former    Current packs/day: 0.00    Average packs/day: 3.0 packs/day for 45.0 years (135.0 ttl pk-yrs)    Types: Cigarettes    Start date: 08/08/1952    Quit date: 08/08/1997    Years since quitting: 25.8    Passive exposure: Past   Smokeless tobacco: Former  Building services engineer status: Never Used  Substance and Sexual Activity   Alcohol use:  No    Alcohol/week: 0.0 standard drinks of alcohol   Drug use: No   Sexual activity: Yes  Other Topics Concern   Not on file  Social History Narrative   Not on file   Social Determinants of Health   Financial Resource Strain: Low Risk  (11/17/2022)   Overall Financial Resource Strain (CARDIA)    Difficulty of Paying Living Expenses: Not hard at all  Food Insecurity: No Food Insecurity (11/17/2022)   Hunger Vital Sign    Worried About Running Out of Food in the Last Year: Never true    Ran Out of Food in the Last Year: Never true  Transportation Needs: No Transportation Needs (11/17/2022)   PRAPARE - Administrator, Civil Service (Medical): No    Lack of Transportation (Non-Medical): No  Physical Activity: Insufficiently Active (11/11/2021)   Exercise Vital Sign    Days of Exercise per Week: 3 days    Minutes of Exercise per Session: 30 min  Stress: No Stress Concern Present (11/17/2022)   [employer] of Occupational Health - Occupational Stress Questionnaire    Feeling of Stress : Not at all  Social Connections: Socially Integrated (11/17/2022)   Social Connection and Isolation Panel [NHANES]    Frequency of Communication with Friends and Family: More than three times a week    Frequency of Social Gatherings with Friends and Family: More than three times a week     Attends Religious Services: More than 4 times per year    Active Member of [employer] or Organizations: Yes    Attends Banker Meetings: More than 4 times per year    Marital Status: Married  Catering manager Violence: Not At Risk (11/17/2022)   Humiliation, Afraid, Rape, and Kick questionnaire    Fear of Current or Ex-Partner: No    Emotionally Abused: No    Physically Abused: No    Sexually Abused: No      Review of Systems  Gastrointestinal:  Positive for diarrhea.  All other systems reviewed and are negative.      Objective:   Physical Exam Vitals reviewed.  Constitutional:      General: He is not in acute distress.    Appearance: Normal appearance. He is well-developed. He is not ill-appearing, toxic-appearing or diaphoretic.  Cardiovascular:     Rate and Rhythm: Normal rate and regular rhythm.     Pulses:          Dorsalis pedis pulses are 2+ on the right side.     Heart sounds: Normal heart sounds. No murmur heard. Pulmonary:     Effort: Pulmonary effort is normal. No tachypnea, accessory muscle usage, prolonged expiration or respiratory distress.     Breath sounds: Rales present. No wheezing.  Abdominal:     General: Bowel sounds are normal. There is no distension.     Palpations: Abdomen is soft. There is no mass.     Tenderness: There is no abdominal tenderness. There is no guarding or rebound.  Musculoskeletal:     Right lower leg: No edema.     Left lower leg: No edema.     Right foot: Normal range of motion. No deformity.       Feet:  Feet:     Right foot:     Skin integrity: Skin integrity normal. No ulcer, blister, skin breakdown, erythema or warmth.  Neurological:     Mental Status: He is alert.  Assessment & Plan:  Plantar fasciitis, right Patient has plantar fasciitis in the right foot.  Using sterile technique, I injected the insertion of the plantar fascia on the right calcaneus with a mixture of 1 cc of lidocaine and 1  cc of 40 mg/mL Kenalog.  The patient tolerated the procedure well without complication.

## 2023-05-29 NOTE — Telephone Encounter (Signed)
Oral Oncology Patient Advocate Encounter   Submitted application for assistance for Tasigna to [employer].   Application submitted via e-fax to [phone]   [employer] phone number [phone].   I will continue to check the status until final determination.   [name], CPhT-Adv Oncology Pharmacy Patient [facility] Direct Number: [phone]  Fax: [phone]

## 2023-05-30 LAB — CUP PACEART REMOTE DEVICE CHECK
Battery Remaining Longevity: 22 mo
Battery Voltage: 2.95 V
Brady Statistic RV Percent Paced: 0.14 %
Date Time Interrogation Session: 20241021012203
HighPow Impedance: 56 Ohm
Implantable Lead Connection Status: 753985
Implantable Lead Implant Date: 20151014
Implantable Lead Location: 753860
Implantable Pulse Generator Implant Date: 20151014
Lead Channel Impedance Value: 399 Ohm
Lead Channel Impedance Value: 418 Ohm
Lead Channel Pacing Threshold Amplitude: 0.625 V
Lead Channel Pacing Threshold Pulse Width: 0.4 ms
Lead Channel Sensing Intrinsic Amplitude: 3.375 mV
Lead Channel Sensing Intrinsic Amplitude: 3.375 mV
Lead Channel Setting Pacing Amplitude: 2 V
Lead Channel Setting Pacing Pulse Width: 0.4 ms
Lead Channel Setting Sensing Sensitivity: 0.3 mV
Zone Setting Status: 755011

## 2023-05-30 MED ORDER — TRIAMCINOLONE ACETONIDE 40 MG/ML IJ SUSP
40.0000 mg | Freq: Once | INTRAMUSCULAR | Status: AC
  Administered 2023-05-29: 40 mg via INTRA_ARTICULAR

## 2023-05-30 NOTE — Addendum Note (Signed)
Addended by: Venia Carbon K on: 05/30/2023 08:52 AM   Modules accepted: Orders

## 2023-06-05 NOTE — Telephone Encounter (Signed)
Oral Oncology Patient Advocate Encounter  Received fax from [employer] regarding patient's re-enrollment application for Tasigna.   Patient must first apply for Medicare Extra Help (LIS) and be denied before they are eligible to continue receiving assistance.  Jinger Neighbors, CPhT-Adv Oncology Pharmacy Patient Advocate [facility] Direct Number: [phone]  Fax: [phone]

## 2023-06-07 LAB — BCR/ABL

## 2023-06-08 ENCOUNTER — Encounter: Payer: Self-pay | Admitting: Hematology and Oncology

## 2023-06-08 ENCOUNTER — Inpatient Hospital Stay: Payer: Medicare HMO | Admitting: Hematology and Oncology

## 2023-06-08 VITALS — BP 165/75 | HR 67 | Temp 98.0°F | Resp 18 | Ht 69.0 in | Wt 188.1 lb

## 2023-06-08 DIAGNOSIS — D6481 Anemia due to antineoplastic chemotherapy: Secondary | ICD-10-CM | POA: Diagnosis not present

## 2023-06-08 DIAGNOSIS — C9210 Chronic myeloid leukemia, BCR/ABL-positive, not having achieved remission: Secondary | ICD-10-CM

## 2023-06-08 DIAGNOSIS — T451X5A Adverse effect of antineoplastic and immunosuppressive drugs, initial encounter: Secondary | ICD-10-CM

## 2023-06-08 DIAGNOSIS — I10 Essential (primary) hypertension: Secondary | ICD-10-CM | POA: Diagnosis not present

## 2023-06-08 DIAGNOSIS — D649 Anemia, unspecified: Secondary | ICD-10-CM | POA: Diagnosis not present

## 2023-06-08 NOTE — Assessment & Plan Note (Signed)
His blood pressure is intermittently elevated He will continue medical management

## 2023-06-08 NOTE — Progress Notes (Signed)
[facility] OFFICE PROGRESS NOTE  Patient Care Team: [name], MD as PCP - General (Family Medicine) [name], MD as PCP - Cardiology (Cardiology) [name], MD as PCP - Electrophysiology (Cardiology) [name], MD as Attending Physician (Cardiology) [name], MD (Family Medicine) [name], MD (Cardiothoracic Surgery) Early, [name], MD (Inactive) as Attending Physician (Vascular Surgery) [name], MD as Consulting Physician (Hematology and Oncology) Duke Salvia, MD as Consulting Physician (Cardiology) Erroll Luna, [facility] (Inactive) as Pharmacist (Pharmacist) Earlene Plater, Rita Ohara, [facility] (Inactive) as Pharmacist (Pharmacist) [facility]  ASSESSMENT & PLAN:  Chronic myelogenous leukemia I have reviewed test results with the patient and his wife He is compliant taking his medication as directed His recent molecular test detected very low level of abnormalities but he is still in major molecular response For now, we will continue close monitoring and follow-up in 3 months  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 The patient denies recent history of bleeding such as epistaxis, hematuria or hematochezia. He is asymptomatic from the anemia. We will observe for now.  HTN (hypertension) His blood pressure is intermittently elevated He will continue medical management  No orders of the defined types were placed in this encounter.   All questions were answered. The patient knows to call the clinic with any problems, questions or concerns. The total time spent in the appointment was 20 minutes encounter with patients including review of chart and various tests results, discussions about plan of care and coordination of care plan   [name], MD 06/08/2023 8:48 AM  INTERVAL HISTORY: Please see below for problem oriented charting. he returns for  surveillance follow-up while on Tasigna for CML He denies recent infection or side effects from treatment No recurrent shortness of breath He denies missing doses We discussed test results and future follow-up  REVIEW OF SYSTEMS:   Constitutional: Denies fevers, chills or abnormal weight loss Eyes: Denies blurriness of vision Ears, nose, mouth, throat, and face: Denies mucositis or sore throat Respiratory: Denies cough, dyspnea or wheezes Cardiovascular: Denies palpitation, chest discomfort or lower extremity swelling Gastrointestinal:  Denies nausea, heartburn or change in bowel habits Skin: Denies abnormal skin rashes Lymphatics: Denies new lymphadenopathy or easy bruising Neurological:Denies numbness, tingling or new weaknesses Behavioral/Psych: Mood is stable, no new changes  All other systems were reviewed with the patient and are negative.  I have reviewed the past medical history, past surgical history, social history and family history with the patient and they are unchanged from previous note.  ALLERGIES:  is allergic to breztri aerosphere [budeson-glycopyrrol-formoterol], norvasc [amlodipine besylate], and sulfa antibiotics.  MEDICATIONS:  Current Outpatient Medications  Medication Sig Dispense Refill   aspirin EC 81 MG tablet Take 1 tablet (81 mg total) by mouth daily. Swallow whole.     atorvastatin (LIPITOR) 80 MG tablet TAKE 1 TABLET BY MOUTH ONCE DAILY AT  6PM 90 tablet 2   carvedilol (COREG) 25 MG tablet Take 1 tablet (25 mg total) by mouth 2 (two) times daily with a meal. 180 tablet 3   diphenhydramine-acetaminophen (TYLENOL PM) 25-500 MG TABS tablet Take 1 tablet by mouth at bedtime as needed (sleep).     empagliflozin (JARDIANCE) 25 MG TABS tablet Take 1 tablet (25 mg total) by mouth daily before breakfast. 90 tablet 3   ezetimibe (ZETIA) 10 MG tablet Take 1 tablet by mouth once daily 90 tablet 2  furosemide (LASIX) 20 MG tablet Take 1 tablet (20 mg total) by mouth  as needed. Take 1 tablet daily as needed for swelling. 90 tablet 3   meclizine (ANTIVERT) 25 MG tablet Take 25 mg by mouth 3 (three) times daily as needed for dizziness.     nilotinib (TASIGNA) 50 MG capsule Take 2 capsules (100 mg total) by mouth every 12 (twelve) hours. Take on empty stomach, at least one hour before or two hours after food. (Patient taking differently: Take 50 mg by mouth every 12 (twelve) hours. Take on empty stomach, at least one hour before or two hours after food.) 360 capsule 0   nitroGLYCERIN (NITROSTAT) 0.4 MG SL tablet Place 0.4 mg under the tongue every 5 (five) minutes as needed for chest pain.     No current facility-administered medications for this visit.    SUMMARY OF ONCOLOGIC HISTORY: Oncology History  Chronic myelogenous leukemia (HCC)  05/04/2012 Bone Marrow Biopsy   BM confirmed diagnosis of CML in Chronic phase. BCR/ABL by PCR detected abnormalitis with b2a2 & b3a2 subtypes   05/09/2012 - 03/16/2015 Chemotherapy   He was started on treatment with Dasatinib 100 mg daily   06/06/2012 Adverse Reaction   Dose of medication was reduced to 50 mg daily due to pancytopenia   01/24/2013 Progression   Patient was noted to have elevated blood count which and was subsequently found to be noncompliant to treatment. The patient has not been on treatment for several months because his prescription ran out. He was restarted back on treatment   01/16/2014 Progression   Bcr/ABL by PCR is worse. Dose of Dasatinib was increased to 100 mg.   02/26/2014 Tumor Marker   Blood work for ABL kinase mutation was negative.   10/29/2014 Tumor Marker   BCR/ABL b2a2 & b3a2 0.29%, IS 0.1624%, not in MMR yet but improving   11/05/2014 Adverse Reaction   He had thoracentesis due to pleural effusion   11/27/2014 Surgery   He underwent right video-assisted thoracoscopy, Drainage of pleural effusion, Pleural biopsy, Diaphragm biopsy, Lung biopsy & Talc pleurodesis   01/30/2015 Tumor  Marker   BCR/ABL b2a2 & b3a2 0.78%, IS 0.4368%, not in MMR    03/11/2015 Pathology Results   BCR/ABL b2a2 0.19%, IS 0.1064%, not in MMR    03/16/2015 Adverse Reaction   Dasatinib was stopped due to recurrent pleural effusion   03/28/2015 - 11/06/2017 Chemotherapy   He started on Bosutinib   03/30/2015 Procedure   He has therapeutic thoracentesis of the right lung with 1 liter of fluid removed   03/30/2015 Adverse Reaction   Bosutinib is placed on hold, to be restart on 8/25 at 250 mg due to severe diarrhea   04/28/2015 Tumor Marker   BCR/ABL b2a2 0.06%, IS 0.0336%, in MMR    06/10/2015 Tumor Marker   BCR/ABL b2a2 0.14%, IS 0.1162%, not in MMR    08/14/2015 Pathology Results   BCR/ABL b2a2 0.02%, IS 0.0166%, In MMR    11/09/2015 Tumor Marker   BCR/ABL b2a2 0.005%, IS 0.0042%, In MMR    02/08/2016 Tumor Marker   BCR/ABL undetectable. In MMR   06/13/2016 Tumor Marker   BCR/ABL undetectable. In MMR   12/06/2016 Pathology Results   BCR/ABL undetectable. In MMR   06/08/2017 Pathology Results   BCR/ABL undetectable. In MMR   09/08/2017 Pathology Results   BCR/ABL undetectable. In MMR   12/11/2017 Pathology Results   BCR/ABL undetectable. In MMR   03/19/2018 Pathology Results   BCR/ABL undetectable.  In MMR   06/12/2018 Pathology Results   BCR/ABL undetectable. In MMR   09/06/2018 Pathology Results   BCR ABL is detectable. e13a2 (b2a2) and [id number]) IS: 0.009%   09/26/2018 -  Chemotherapy   The patient had Tasigna    11/21/2018 Pathology Results   BCR/ABL undetectable. In MMR   03/07/2019 Pathology Results   BCR/ABL undetectable. In MMR   06/17/2019 Pathology Results   BCR/ABL undetectable. In MMR   10/14/2019 Pathology Results   BCR ABL is detectable. e13a2 (b2a2) and W09W1(X9J4) IS: 0.0281%. In MMR   01/14/2020 Pathology Results   BCR ABL is detectable. e13a2 (b2a2) and N82N5(A2Z3) IS: 0.023%. In MMR   10/23/2020 Pathology Results   BCR ABL is detectable. e13a2 (b2a2) and  Y86V7(Q4O9) IS: 0.0643%. In MMR   01/22/2021 Pathology Results   BCR ABL is detectable. e13a2 (b2a2) and G29B2(W4X3) IS: 0.027%. In MMR   05/25/2021 Pathology Results   BCR ABL is detectable. e13a2 (b2a2) and K44W1(U2V2) IS: 0.0091%. In MMR   08/25/2021 Pathology Results   BCR ABL is detectable. e13a2 (b2a2) and Z36U4(Q0H4) IS: 0.0039%. In MMR   12/13/2021 Pathology Results   BCR/ABL undetectable. In MMR   03/15/2022 Pathology Results   BCR/ABL undetectable. In MMR   05/16/2022 Pathology Results   BCR/ABL undetectable. In MMR     08/17/2022 Pathology Results   BCR/ABL undetectable. In MMR    11/14/2022 Pathology Results   BCR/ABL undetectable. In MMR      02/24/2023 Pathology Results   BCR/ABL undetectable. In MMR       05/29/2023 Pathology Results   BCR ABL is detectable. e13a2 (b2a2) IS: 0.0164%. In MMR     PHYSICAL EXAMINATION: ECOG PERFORMANCE STATUS: 0 - Asymptomatic  There were no vitals filed for this visit. There were no vitals filed for this visit.  GENERAL:alert, no distress and comfortable LABORATORY DATA:  I have reviewed the data as listed    Component Value Date/Time   NA 144 05/29/2023 0844   NA 142 11/02/2022 1403   NA 142 06/08/2017 0832   K 4.4 05/29/2023 0844   K 3.9 06/08/2017 0832   CL 110 05/29/2023 0844   CL 110 (H) 11/14/2012 0918   CO2 27 05/29/2023 0844   CO2 25 06/08/2017 0832   GLUCOSE 99 05/29/2023 0844   GLUCOSE 98 06/08/2017 0832   GLUCOSE 108 (H) 11/14/2012 0918   BUN 15 05/29/2023 0844   BUN 21 11/02/2022 1403   BUN 11.6 06/08/2017 0832   CREATININE 0.97 05/29/2023 0844   CREATININE 1.13 01/22/2021 0840   CREATININE 0.90 05/31/2019 0837   CREATININE 0.9 06/08/2017 0832   CALCIUM 9.0 05/29/2023 0844   CALCIUM 8.6 06/08/2017 0832   PROT 6.4 (L) 05/29/2023 0844   PROT 6.5 11/02/2022 1403   PROT 6.7 06/08/2017 0832   ALBUMIN 3.7 05/29/2023 0844   ALBUMIN 3.7 (L) 11/02/2022 1403   ALBUMIN 3.4 (L) 06/08/2017 0832   AST 15  05/29/2023 0844   AST 13 (L) 01/22/2021 0840   AST 13 06/08/2017 0832   ALT 13 05/29/2023 0844   ALT 14 01/22/2021 0840   ALT 7 06/08/2017 0832   ALKPHOS 85 05/29/2023 0844   ALKPHOS 96 06/08/2017 0832   BILITOT 0.7 05/29/2023 0844   BILITOT 0.6 11/02/2022 1403   BILITOT 0.3 01/22/2021 0840   BILITOT 0.44 06/08/2017 0832   GFRNONAA >60 05/29/2023 0844   GFRNONAA >60 01/22/2021 0840   GFRNONAA 83 05/31/2019 0837   GFRAA >60 04/24/2020 7425  GFRAA 96 05/31/2019 0837    No results found for: "SPEP", "UPEP"  Lab Results  Component Value Date   WBC 7.3 05/29/2023   NEUTROABS 5.5 05/29/2023   HGB 12.1 (L) 05/29/2023   HCT 36.5 (L) 05/29/2023   MCV 96.6 05/29/2023   PLT 174 05/29/2023      Chemistry      Component Value Date/Time   NA 144 05/29/2023 0844   NA 142 11/02/2022 1403   NA 142 06/08/2017 0832   K 4.4 05/29/2023 0844   K 3.9 06/08/2017 0832   CL 110 05/29/2023 0844   CL 110 (H) 11/14/2012 0918   CO2 27 05/29/2023 0844   CO2 25 06/08/2017 0832   BUN 15 05/29/2023 0844   BUN 21 11/02/2022 1403   BUN 11.6 06/08/2017 0832   CREATININE 0.97 05/29/2023 0844   CREATININE 1.13 01/22/2021 0840   CREATININE 0.90 05/31/2019 0837   CREATININE 0.9 06/08/2017 0832      Component Value Date/Time   CALCIUM 9.0 05/29/2023 0844   CALCIUM 8.6 06/08/2017 0832   ALKPHOS 85 05/29/2023 0844   ALKPHOS 96 06/08/2017 0832   AST 15 05/29/2023 0844   AST 13 (L) 01/22/2021 0840   AST 13 06/08/2017 0832   ALT 13 05/29/2023 0844   ALT 14 01/22/2021 0840   ALT 7 06/08/2017 0832   BILITOT 0.7 05/29/2023 0844   BILITOT 0.6 11/02/2022 1403   BILITOT 0.3 01/22/2021 0840   BILITOT 0.44 06/08/2017 4540

## 2023-06-08 NOTE — Assessment & Plan Note (Signed)
I have reviewed test results with the patient and his wife He is compliant taking his medication as directed His recent molecular test detected very low level of abnormalities but he is still in major molecular response For now, we will continue close monitoring and follow-up in 3 months

## 2023-06-08 NOTE — Assessment & Plan Note (Signed)
This is likely anemia of chronic disease. The patient denies recent history of bleeding such as epistaxis, hematuria or hematochezia. He is asymptomatic from the anemia. We will observe for now.  

## 2023-06-14 NOTE — Progress Notes (Signed)
Remote ICD transmission.   

## 2023-06-22 NOTE — Telephone Encounter (Signed)
Oral Oncology Patient Advocate Encounter  Patient states they have an appointment with Social Security next Thursday, 06/29/23, to complete the application for LIS Medicare Extra Help.  Jinger Neighbors, CPhT-Adv Oncology Pharmacy Patient Advocate [facility] Direct Number: [phone]  Fax: [phone]

## 2023-06-23 ENCOUNTER — Telehealth: Payer: Self-pay

## 2023-06-23 NOTE — Telephone Encounter (Signed)
Pt brought in financial patient assistance forms for Jardiance. Pt states his cardiologist increased his dosage but PCP has to complete the forms.  Form in green folder to be signed. Thank you.

## 2023-06-28 NOTE — Telephone Encounter (Signed)
Paperwork completed and signed at no charte.  Successfully faxed to [employer] Patient Assistance program using number on forms with a confirmation time stamp of 06-28-2023 8:26. Also successfully faxed to [facility] with a confirmation time stamp of 06-28-2023 10:00.  Patient advised via outbound call and came to pick up originals.

## 2023-08-07 ENCOUNTER — Ambulatory Visit (INDEPENDENT_AMBULATORY_CARE_PROVIDER_SITE_OTHER): Payer: Medicare HMO | Admitting: Family Medicine

## 2023-08-07 ENCOUNTER — Encounter: Payer: Self-pay | Admitting: Family Medicine

## 2023-08-07 ENCOUNTER — Ambulatory Visit: Payer: Medicare HMO | Admitting: Family Medicine

## 2023-08-07 VITALS — BP 120/58 | HR 72 | Temp 98.3°F | Ht 69.0 in | Wt 183.0 lb

## 2023-08-07 DIAGNOSIS — J069 Acute upper respiratory infection, unspecified: Secondary | ICD-10-CM | POA: Insufficient documentation

## 2023-08-07 MED ORDER — BENZONATATE 200 MG PO CAPS: 200.0000 mg | ORAL_CAPSULE | Freq: Two times a day (BID) | ORAL | 0 refills | Status: DC | PRN

## 2023-08-07 NOTE — Assessment & Plan Note (Signed)
Symptoms consistent with viral URI. Patient declined flu/covid/RSV testing today. Reassured patient that symptoms and exam findings are most consistent with a viral upper respiratory infection and explained lack of efficacy of antibiotics against viruses.  Discussed expected course and features suggestive of secondary bacterial infection.  Continue supportive care. Increase fluid intake with water or electrolyte solution like pedialyte. Encouraged acetaminophen as needed for fever/pain. Encouraged salt water gargling, chloraseptic spray and throat lozenges. Encouraged OTC guaifenesin. Encouraged saline sinus flushes and/or neti with humidified air. Tessalon PRN for cough.

## 2023-08-07 NOTE — Progress Notes (Signed)
Subjective:  HPI: Anthony Wolfe is a 77 y.o. male presenting on 08/07/2023 for Follow-up (ACUTE - Chest congestion/)   HPI Patient is in today for mucopurulent cough and congestion for 4 days with malaise and wheezing. He has been out hunting prior to getting ill. Denies fever, chills, body aches, shortness of breath, pleurisy Known exposures include his wife who has similar symptoms, no home Covid test. Has tried Alka Seltzer cold and flu and Coricidin   Review of Systems  All other systems reviewed and are negative.   Relevant past medical history reviewed and updated as indicated.   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 difficile colitis 06/16/2014   CAD (coronary artery disease)    a. s/p 3v CABG 2010. b. NSTEMI 05/2014 in setting of VT. Cath impressions: "Recent IMI with occluded SVG to RCA. Has Right to Right collaterals and collaterals from septal and OM. EF 40%."   Carotid disease, bilateral (HCC)    a. Right CEA 2002; known occluded left carotid. b. Duplex 06/2014: known occluded LICA, stable 1-39% RICA s/p CEA.   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 Prior EF 36%. b. 2014: 50%. c. 05/2014: EF 35-40% by echo, 40% with inf HK by cath.   NSTEMI (non-ST elevation myocardial infarction) (HCC) 05/08/2014   Pleural effusion 05/08/2014   Pneumonia 05/23/2014   Presence of permanent cardiac pacemaker    Prostate hypertrophy    PVC's (premature ventricular contractions)    a. PVC's with prior Holter showing PVC load of 22%.   Shortness of breath dyspnea    Stroke (HCC) 08/09/1999   TIA  (transient ischemic attack)    ASPVD, S./P. right CEA, 2002, and known occluded left carotid   Ventricular tachycardia (HCC)    a. Admitted with VT 05/2013 - EPS with inducible sustained monomorphic VT; s/p Medtronic ICD 05/21/14.     Past Surgical History:  Procedure Laterality Date   A-FLUTTER ABLATION N/A 01/06/2023   Procedure: A-FLUTTER ABLATION;  Surgeon: [name], MD;  Location: [facility] INVASIVE CV LAB;  Service: Cardiovascular;  Laterality: N/A;   BACK SURGERY     CARDIOVERSION N/A 01/04/2022   Procedure: CARDIOVERSION;  Surgeon: Parke Poisson, MD;  Location: [facility] ENDOSCOPY;  Service: Cardiovascular;  Laterality: N/A;   CARDIOVERSION N/A 11/24/2022   Procedure: CARDIOVERSION;  Surgeon: Thomasene Ripple, DO;  Location: [facility] INVASIVE CV LAB;  Service: Cardiovascular;  Laterality: N/A;   CAROTID ENDARTERECTOMY Right 01   carotidectomy  2003   right side   COLONOSCOPY  07/2010   CORONARY ARTERY BYPASS GRAFT  02/2009   3 vessel   ELECTROPHYSIOLOGIC STUDY  05/21/14   ELECTROPHYSIOLOGY STUDY N/A 05/21/2014   Procedure: ELECTROPHYSIOLOGY STUDY;  Surgeon: Duke Salvia, MD;  Location: [facility] CATH LAB;  Service: Cardiovascular;  Laterality: N/A;   EP IMPLANTABLE DEVICE  05/21/14   single chamber Metronic ICD   IR THORACENTESIS ASP PLEURAL SPACE W/IMG GUIDE  12/07/2021   LEFT HEART CATHETERIZATION WITH CORONARY/GRAFT ANGIOGRAM N/A 05/19/2014   Procedure: LEFT HEART CATHETERIZATION WITH  Isabel Caprice;  Surgeon: Wendall Stade, MD;  Location: [facility] CATH LAB;  Service: Cardiovascular;  Laterality: N/A;   PLEURAL BIOPSY N/A 11/27/2014   Procedure: PLEURAL BIOPSY;  Surgeon: [name], MD;  Location: [facility] OR;  Service: Thoracic;  Laterality: N/A;   PLEURAL EFFUSION DRAINAGE Right 11/27/2014   Procedure: DRAINAGE OF PLEURAL EFFUSION;  Surgeon: [name], MD;  Location: [facility] OR;  Service: Thoracic;  Laterality: Right;   TALC PLEURODESIS Right 11/27/2014   Procedure: Lurlean Nanny;  Surgeon: [name], MD;  Location: [facility] OR;  Service: Thoracic;  Laterality: Right;   VIDEO ASSISTED THORACOSCOPY Right 11/27/2014   Procedure: RIGHT VIDEO ASSISTED THORACOSCOPY;  Surgeon: [name], MD;  Location: [facility] OR;  Service: Thoracic;  Laterality: Right;    Allergies and medications reviewed and updated.   Current Outpatient Medications:    aspirin EC 81 MG tablet, Take 1 tablet (81 mg total) by mouth daily. Swallow whole., Disp: , Rfl:    atorvastatin (LIPITOR) 80 MG tablet, TAKE 1 TABLET BY MOUTH ONCE DAILY AT  6PM, Disp: 90 tablet, Rfl: 2   benzonatate (TESSALON) 200 MG capsule, Take 1 capsule (200 mg total) by mouth 2 (two) times daily as needed for cough., Disp: 20 capsule, Rfl: 0   carvedilol (COREG) 25 MG tablet, Take 1 tablet (25 mg total) by mouth 2 (two) times daily with a meal., Disp: 180 tablet, Rfl: 3   diphenhydramine-acetaminophen (TYLENOL PM) 25-500 MG TABS tablet, Take 1 tablet by mouth at bedtime as needed (sleep)., Disp: , Rfl:    empagliflozin (JARDIANCE) 25 MG TABS tablet, Take 1 tablet (25 mg total) by mouth daily before breakfast., Disp: 90 tablet, Rfl: 3   ezetimibe (ZETIA) 10 MG tablet, Take 1 tablet by mouth once daily, Disp: 90 tablet, Rfl: 2   meclizine (ANTIVERT) 25 MG tablet, Take 25 mg by mouth 3 (three) times daily as needed for dizziness., Disp: , Rfl:    nilotinib (TASIGNA) 50 MG capsule, Take 2 capsules (100 mg total) by mouth every 12 (twelve) hours. Take on empty stomach, at least one hour before or two hours after food. (Patient taking differently: Take 50 mg by mouth every 12 (twelve) hours. Take on empty stomach, at least one hour before or two hours after food.), Disp: 360 capsule, Rfl: 0   nitroGLYCERIN (NITROSTAT) 0.4 MG SL tablet, Place 0.4 mg under the tongue every 5 (five) minutes as needed for chest pain., Disp: , Rfl:    furosemide (LASIX) 20 MG tablet, Take 1 tablet (20 mg total) by mouth as needed. Take 1  tablet daily as needed for swelling., Disp: 90 tablet, Rfl: 3  Allergies  Allergen Reactions   Breztri Aerosphere [Budeson-Glycopyrrol-Formoterol] Swelling    Swelling of mouth tongue    Norvasc [Amlodipine Besylate] Rash    rash   Sulfa Antibiotics Hives    Hives     Objective:   BP (!) 120/58   Pulse 72   Temp 98.3 F (36.8 C) (Oral)   Ht 5\' 9"  (1.753 m)   Wt 183 lb (83 kg)   SpO2 92%   BMI 27.02 kg/m      08/07/2023    8:24 AM 06/08/2023    8:40 AM 05/29/2023    3:15 PM  Vitals with BMI  Height 5\' 9"  5\' 9"  5\' 9"   Weight 183 lbs 188 lbs 2 oz 189 lbs  BMI 27.01 27.77 27.9  Systolic 120 165 696  Diastolic  58 75 80  Pulse 72 67 81     Physical Exam Vitals and nursing note reviewed.  Constitutional:      Appearance: Normal appearance. He is normal weight.  HENT:     Head: Normocephalic and atraumatic.     Right Ear: Tympanic membrane, ear canal and external ear normal.     Left Ear: Tympanic membrane, ear canal and external ear normal.     Nose: Congestion present.     Mouth/Throat:     Mouth: Mucous membranes are moist.     Pharynx: Oropharynx is clear.  Eyes:     Conjunctiva/sclera:     Right eye: Right conjunctiva is injected.     Left eye: Left conjunctiva is injected.  Cardiovascular:     Rate and Rhythm: Normal rate and regular rhythm.     Pulses: Normal pulses.     Heart sounds: Normal heart sounds.  Pulmonary:     Effort: Pulmonary effort is normal.     Breath sounds: Normal breath sounds.  Musculoskeletal:     Cervical back: No tenderness.  Lymphadenopathy:     Cervical: No cervical adenopathy.  Skin:    General: Skin is warm and dry.     Capillary Refill: Capillary refill takes less than 2 seconds.  Neurological:     General: No focal deficit present.     Mental Status: He is alert and oriented to person, place, and time. Mental status is at baseline.  Psychiatric:        Mood and Affect: Mood normal.        Behavior: Behavior normal.         Thought Content: Thought content normal.        Judgment: Judgment normal.     Assessment & Plan:  Viral URI with cough Assessment & Plan: Symptoms consistent with viral URI. Patient declined flu/covid/RSV testing today. Reassured patient that symptoms and exam findings are most consistent with a viral upper respiratory infection and explained lack of efficacy of antibiotics against viruses.  Discussed expected course and features suggestive of secondary bacterial infection.  Continue supportive care. Increase fluid intake with water or electrolyte solution like pedialyte. Encouraged acetaminophen as needed for fever/pain. Encouraged salt water gargling, chloraseptic spray and throat lozenges. Encouraged OTC guaifenesin. Encouraged saline sinus flushes and/or neti with humidified air. Tessalon PRN for cough.   Other orders -     Benzonatate; Take 1 capsule (200 mg total) by mouth 2 (two) times daily as needed for cough.  Dispense: 20 capsule; Refill: 0     Follow up plan: Return if symptoms worsen or fail to improve.  Park Meo, FNP

## 2023-08-10 ENCOUNTER — Ambulatory Visit (INDEPENDENT_AMBULATORY_CARE_PROVIDER_SITE_OTHER): Payer: Medicare Other | Admitting: Family Medicine

## 2023-08-10 ENCOUNTER — Encounter: Payer: Self-pay | Admitting: Family Medicine

## 2023-08-10 ENCOUNTER — Ambulatory Visit: Payer: Self-pay

## 2023-08-10 VITALS — BP 130/80 | HR 80 | Temp 99.2°F | Ht 69.0 in | Wt 180.0 lb

## 2023-08-10 DIAGNOSIS — J441 Chronic obstructive pulmonary disease with (acute) exacerbation: Secondary | ICD-10-CM | POA: Diagnosis not present

## 2023-08-10 MED ORDER — AZITHROMYCIN 250 MG PO TABS: ORAL_TABLET | ORAL | 0 refills | Status: DC

## 2023-08-10 MED ORDER — PREDNISONE 20 MG PO TABS: 40.0000 mg | ORAL_TABLET | Freq: Every day | ORAL | 0 refills | Status: DC

## 2023-08-10 NOTE — Progress Notes (Signed)
 Subjective:    Patient ID: Anthony Wolfe, male    DOB: [birth year], 78 y.o.   MRN: [id number]  Patient is a very pleasant 78 year old Caucasian gentleman with a history of coronary artery disease status post myocardial infarction, congestive heart failure with reduced ejection fraction, paroxysmal atrial fibrillation, history of bilateral carotid artery stenosis, COPD, and CML.   Patient states that he has been sick for over a week.  He is now short of breath.  He has a cough productive of yellow and green sputum.  He is wheezing.  He has diminished breath sounds bilaterally with expiratory wheezing in all 4 lung fields.  He denies any fever or chills but he does report feeling sick and weak.  He states that he feels sicker today than he did when he had pneumonia. 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 difficile colitis 06/16/2014   CAD (coronary artery disease)    a. s/p 3v CABG 2010. b. NSTEMI 05/2014 in setting of VT. Cath impressions: Recent IMI with occluded SVG to RCA. Has Right to Right collaterals and collaterals from septal and OM. EF 40%.   Carotid disease, bilateral (HCC)    a. Right CEA 2002; known occluded left carotid. b. Duplex 06/2014: known occluded LICA, stable 1-39% RICA s/p CEA.   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 Prior EF 36%. b. 2014: 50%. c. 05/2014: EF 35-40% by echo, 40% with inf HK by cath.   NSTEMI (non-ST elevation myocardial infarction) (HCC) 05/08/2014   Pleural effusion 05/08/2014   Pneumonia 05/23/2014   Presence of permanent cardiac pacemaker    Prostate hypertrophy    PVC's  (premature ventricular contractions)    a. PVC's with prior Holter showing PVC load of 22%.   Shortness of breath dyspnea    Stroke (HCC) 08/09/1999   TIA (transient ischemic attack)    ASPVD, S./P. right CEA, 2002, and known occluded left carotid   Ventricular tachycardia (HCC)    a. Admitted with VT 05/2013 - EPS with inducible sustained monomorphic VT; s/p Medtronic ICD 05/21/14.   Past Surgical History:  Procedure Laterality Date   A-FLUTTER ABLATION N/A 01/06/2023   Procedure: A-FLUTTER ABLATION;  Surgeon: Waddell Danelle ORN, MD;  Location: [facility] INVASIVE CV LAB;  Service: Cardiovascular;  Laterality: N/A;   BACK SURGERY     CARDIOVERSION N/A 01/04/2022   Procedure: CARDIOVERSION;  Surgeon: Loni Soyla LABOR, MD;  Location: [facility] ENDOSCOPY;  Service: Cardiovascular;  Laterality: N/A;   CARDIOVERSION N/A 11/24/2022   Procedure: CARDIOVERSION;  Surgeon: Sheena Pugh, DO;  Location: [facility] INVASIVE CV LAB;  Service: Cardiovascular;  Laterality: N/A;   CAROTID ENDARTERECTOMY Right 01   carotidectomy  2003   right side   COLONOSCOPY  07/2010   CORONARY ARTERY BYPASS GRAFT  02/2009   3 vessel   ELECTROPHYSIOLOGIC STUDY  05/21/14   ELECTROPHYSIOLOGY STUDY N/A 05/21/2014   Procedure: ELECTROPHYSIOLOGY STUDY;  Surgeon: Elspeth JAYSON Sage, MD;  Location: [facility] CATH LAB;  Service: Cardiovascular;  Laterality: N/A;   EP IMPLANTABLE DEVICE  05/21/14  single chamber Metronic ICD   IR THORACENTESIS ASP PLEURAL SPACE W/IMG GUIDE  12/07/2021   LEFT HEART CATHETERIZATION WITH CORONARY/GRAFT ANGIOGRAM N/A 05/19/2014   Procedure: LEFT HEART CATHETERIZATION WITH EL BILE;  Surgeon: Maude JAYSON Emmer, MD;  Location: [facility] CATH LAB;  Service: Cardiovascular;  Laterality: N/A;   PLEURAL BIOPSY N/A 11/27/2014   Procedure: PLEURAL BIOPSY;  Surgeon: Elspeth JAYSON Millers, MD;  Location: [facility] OR;  Service: Thoracic;  Laterality: N/A;   PLEURAL EFFUSION DRAINAGE Right 11/27/2014   Procedure: DRAINAGE OF PLEURAL EFFUSION;   Surgeon: Elspeth JAYSON Millers, MD;  Location: [facility] OR;  Service: Thoracic;  Laterality: Right;   TALC  PLEURODESIS Right 11/27/2014   Procedure: TALC  PLEURADESIS;  Surgeon: Elspeth JAYSON Millers, MD;  Location: [facility] OR;  Service: Thoracic;  Laterality: Right;   VIDEO ASSISTED THORACOSCOPY Right 11/27/2014   Procedure: RIGHT VIDEO ASSISTED THORACOSCOPY;  Surgeon: Elspeth JAYSON Millers, MD;  Location: [facility] OR;  Service: Thoracic;  Laterality: Right;   Current Outpatient Medications on File Prior to Visit  Medication Sig Dispense Refill   aspirin  EC 81 MG tablet Take 1 tablet (81 mg total) by mouth daily. Swallow whole.     atorvastatin  (LIPITOR ) 80 MG tablet TAKE 1 TABLET BY MOUTH ONCE DAILY AT  6PM 90 tablet 2   benzonatate  (TESSALON ) 200 MG capsule Take 1 capsule (200 mg total) by mouth 2 (two) times daily as needed for cough. 20 capsule 0   carvedilol  (COREG ) 25 MG tablet Take 1 tablet (25 mg total) by mouth 2 (two) times daily with a meal. 180 tablet 3   diphenhydramine -acetaminophen  (TYLENOL  PM) 25-500 MG TABS tablet Take 1 tablet by mouth at bedtime as needed (sleep).     empagliflozin  (JARDIANCE ) 25 MG TABS tablet Take 1 tablet (25 mg total) by mouth daily before breakfast. 90 tablet 3   ezetimibe  (ZETIA ) 10 MG tablet Take 1 tablet by mouth once daily 90 tablet 2   meclizine  (ANTIVERT ) 25 MG tablet Take 25 mg by mouth 3 (three) times daily as needed for dizziness.     nilotinib  (TASIGNA ) 50 MG capsule Take 2 capsules (100 mg total) by mouth every 12 (twelve) hours. Take on empty stomach, at least one hour before or two hours after food. (Patient taking differently: Take 50 mg by mouth every 12 (twelve) hours. Take on empty stomach, at least one hour before or two hours after food.) 360 capsule 0   furosemide  (LASIX ) 20 MG tablet Take 1 tablet (20 mg total) by mouth as needed. Take 1 tablet daily as needed for swelling. 90 tablet 3   nitroGLYCERIN  (NITROSTAT ) 0.4 MG SL tablet Place 0.4 mg under the tongue  every 5 (five) minutes as needed for chest pain.     No current facility-administered medications on file prior to visit.   Allergies  Allergen Reactions   Breztri Aerosphere [Budeson-Glycopyrrol-Formoterol] Swelling    Swelling of mouth tongue    Norvasc [Amlodipine Besylate] Rash    rash   Sulfa Antibiotics Hives    Hives    Social History   Socioeconomic History   Marital status: Married    Spouse name: Not on file   Number of children: 2   Years of education: Not on file   Highest education level: Not on file  Occupational History    Comment: retired administrator  Tobacco Use   Smoking status: Former    Current packs/day: 0.00    Average packs/day: 3.0 packs/day for 45.0 years (135.0 ttl pk-yrs)  Types: Cigarettes    Start date: 08/08/1952    Quit date: 08/08/1997    Years since quitting: 26.0    Passive exposure: Past   Smokeless tobacco: Former  Building Services Engineer status: Never Used  Substance and Sexual Activity   Alcohol use: No    Alcohol/week: 0.0 standard drinks of alcohol   Drug use: No   Sexual activity: Yes  Other Topics Concern   Not on file  Social History Narrative   Not on file   Social Drivers of Health   Financial Resource Strain: Low Risk  (11/17/2022)   Overall Financial Resource Strain (CARDIA)    Difficulty of Paying Living Expenses: Not hard at all  Food Insecurity: No Food Insecurity (11/17/2022)   Hunger Vital Sign    Worried About Running Out of Food in the Last Year: Never true    Ran Out of Food in the Last Year: Never true  Transportation Needs: No Transportation Needs (11/17/2022)   PRAPARE - Administrator, Civil Service (Medical): No    Lack of Transportation (Non-Medical): No  Physical Activity: Insufficiently Active (11/11/2021)   Exercise Vital Sign    Days of Exercise per Week: 3 days    Minutes of Exercise per Session: 30 min  Stress: No Stress Concern Present (11/17/2022)   [employer] of Occupational  Health - Occupational Stress Questionnaire    Feeling of Stress : Not at all  Social Connections: Socially Integrated (11/17/2022)   Social Connection and Isolation Panel [NHANES]    Frequency of Communication with Friends and Family: More than three times a week    Frequency of Social Gatherings with Friends and Family: More than three times a week    Attends Religious Services: More than 4 times per year    Active Member of [employer] or Organizations: Yes    Attends Banker Meetings: More than 4 times per year    Marital Status: Married  Catering Manager Violence: Not At Risk (11/17/2022)   Humiliation, Afraid, Rape, and Kick questionnaire    Fear of Current or Ex-Partner: No    Emotionally Abused: No    Physically Abused: No    Sexually Abused: No      Review of Systems  Gastrointestinal:  Positive for diarrhea.  All other systems reviewed and are negative.      Objective:   Physical Exam Vitals reviewed.  Constitutional:      General: He is not in acute distress.    Appearance: Normal appearance. He is well-developed. He is not ill-appearing, toxic-appearing or diaphoretic.  Cardiovascular:     Rate and Rhythm: Normal rate. Rhythm irregularly irregular.     Heart sounds: Normal heart sounds. No murmur heard. Pulmonary:     Effort: Pulmonary effort is normal. No tachypnea, accessory muscle usage, prolonged expiration or respiratory distress.     Breath sounds: Decreased air movement present. Decreased breath sounds and wheezing present. No rhonchi.  Abdominal:     General: Bowel sounds are normal. There is no distension.     Palpations: Abdomen is soft. There is no mass.     Tenderness: There is no abdominal tenderness. There is no guarding or rebound.  Musculoskeletal:     Right lower leg: No edema.     Left lower leg: No edema.  Neurological:     Mental Status: He is alert.           Assessment & Plan:  COPD exacerbation (  HCC) Start the patient on a  Z-Pak coupled with prednisone  40 mg daily for 7 days coupled with albuterol  2 puffs every 4-6 hours as needed for wheezing.  Seek medical attention immediately if symptoms worsen.

## 2023-08-14 ENCOUNTER — Other Ambulatory Visit (HOSPITAL_COMMUNITY): Payer: Self-pay

## 2023-08-14 NOTE — Telephone Encounter (Signed)
 Oral Oncology Patient Advocate Encounter   Received notification re-enrollment for assistance for Tasigna  through [employer] has been approved. Patient may continue to receive their medication at $0 from this program.    [employer] phone number [phone].   Effective dates: 08/14/23 through 08/07/24  I have spoken to the patient.  Estefana Moellers, CPhT-Adv Oncology Pharmacy Patient Advocate [facility] Direct Number: [phone]  Fax: [phone]

## 2023-08-14 NOTE — Progress Notes (Signed)
 Cardiology Office Note:    Date:  08/17/2023   ID:  Anthony Wolfe, DOB 1946/06/28, MRN [id number]  PCP:  [name], MD   [facility] Providers Cardiologist:  Wendel Haws, MD Referring MD: [name], MD   Chief Complaint/Reason for Referral: Cardiology follow-up  ASSESSMENT:    1. Typical atrial flutter (HCC)   2. Atrial fibrillation, unspecified type (HCC)   3. Cardiomyopathy, ischemic   4. Coronary artery disease involving native coronary artery of native heart without angina pectoris   5. Hyperlipidemia LDL goal <70   6. Bilateral carotid artery disease, unspecified type (HCC)   7. Aortic atherosclerosis (HCC)   8. ICD (implantable cardioverter-defibrillator) in place      PLAN:    In order of problems listed above: 1.  Atrial flutter: Now status post ablation.  EKG today demonstrates sinus rhythm with a right bundle branch block.  Continue to monitor. 2.  Atrial fibrillation: Continue Coreg , check EKG and monitor. 3.  Ischemic cardiomyopathy: EF of 45 to 50%; increase Jardiance  to 25 mg.  Continue Coreg . 4.  Coronary artery disease: Continue Coreg , Lipitor , and aspirin .   5.  Hyperlipidemia: LDL in August was at goal.  Continue current therapy. 6.  Bilateral carotid disease: Continue aspirin , statin, and blood pressure control. 7.  Aortic atherosclerosis: Continue aspirin , statin, blood pressure control. 8.  Status post ICD: Followed by EP.             Dispo:  Return in about 6 months (around 02/14/2024).      Medication Adjustments/Labs and Tests Ordered: Current medicines are reviewed at length with the patient today.  Concerns regarding medicines are outlined above.  The following changes have been made:     Labs/tests ordered: Orders Placed This Encounter  Procedures   EKG 12-Lead    Medication Changes: No orders of the defined types were placed in this encounter.   Current medicines are reviewed at length with the patient  today.  The patient does not have concerns regarding medicines.   I spent 33 minutes reviewing all clinical data during and prior to this visit including all relevant imaging studies, laboratories, clinical information from other health systems and prior notes from both Cardiology and other specialties, interviewing the patient, conducting a complete physical examination, and coordinating care in order to formulate a comprehensive and personalized evaluation and treatment plan.  History of Present Illness:    FOCUSED PROBLEM LIST:   Coronary artery disease CABG LIMA to LAD, vein graft to OM1-OM2, and vein graft to RCA 2010 Vein graft to RCA occluded cath 2015 Ischemic cardiomyopathy EF 45 to 50% TTE 2024 VT Status post ICD Hypertension Peripheral vascular disease Right CEA with known occluded ICA Emphysema Atrial fibrillation/flutter Cardioversion for atrial fibrillation 2023 Atrial flutter ablation 2024 Hyperlipidemia LP(a) 35.6 Aortic atherosclerosis Chest CT 2018 CML Followed by oncology Pleural effusions Treated with multiple thoracenteses Right bundle branch block   March 2023: Patient seen for initial consultation to reestablish cardiovascular care.  At that appointment losartan  was started as well as Jardiance  and his Coreg  was increased to 25 mg.   April 2023: Patient was seen for expedited follow-up due to the detection of atrial fibrillation at his PCPs office.  He was started on Eliquis  5 mg twice daily.  His mexiletine was discontinued.  He was referred for cardioversion.  Additionally his losartan  was started at 25 mg every afternoon.   May 2023: The patient was seen for expedited follow-up  due to worsening shortness of breath.  He was sent to the emergency department due to suspected pleural effusion.  He was admitted and underwent thoracentesis.  Around 850 cc of straw-colored fluid was evacuated.  Cytology was negative.  Laboratories were consistent with an  exudative effusion.   June 2023: In the interim he underwent cardioversion at the end of May to normal sinus rhythm.  He had a nuisance nosebleed earlier this week and stopped his Eliquis .  He has complaints of fatigue.  However he is able to do a lot of activities including working outside.  He denies any significant shortness of breath, paroxysmal nocturnal dyspnea, orthopnea.  He feels better after his cardioversion is well.  He denies any exertional angina.  He has not required emergency room visits or hospitalizations.  He is otherwise well and without complaints.  Plan: Check lipids, Lp(a), CMP, CBC, reflex TSH, and sleep study.  Restart Eliquis .   March 2023: All blood tests were within normal limits.  A sleep study was not performed.  He was recently seen by his pulmonologist.  He endorsed shortness of breath.  A BNP was checked and found to be elevated to 616.  His creatinine was stable around 1.  Because of these constellation of signs and symptoms he was referred for an expedited appointment.  His Lasix  was increased from as needed to 20 mg twice daily.  He tells me he urinated quite a bit in response to the Lasix .  He feels much better however he continues to have some shortness of breath with more than moderate exertion.  He denies any palpitations or severe bleeding.  He denies any paroxysmal nocturnal dyspnea, orthopnea, or exertional chest pain.  He does not wheeze very often.  He tells me after his cardioversion last year he felt much improved.  He cannot pinpoint a day when things became worse for him and describes more of an insidious course over the last few months.  He fortunately has not required any emergency room visits or hospitalizations.  Plan: Refer to EP due to recurrent atrial fibrillation, discontinue losartan  as patient is on valsartan  as well, discontinue Plavix  as patient is on Eliquis , check lipid panel/LFTs.  7/24: In the interim the patient was seen by EP and was diagnosed  with symptomatic atrial flutter.  He underwent cardioversion followed by atrial flutter ablation.  He was seen in EP follow-up earlier this month and was doing well.  He was taken off his losartan  in the interim due to low blood pressures.  He was completely asymptomatic however.  He continues to do well.  He feels much better following the ablation.  He now only takes Lasix  as needed.  He reports an atypical chest pain.  It happens at rest and it wraps around his lower chest and then moved to his back.  It does not occur with exertion.  He does have a history of multiple back surgeries.  He has required no emergency room visits or hospitalizations.  He has had no bleeding or bruising episodes while on Eliquis .  Plan: Increase Jardiance  to 25 mg; discussed with EP regarding need for Eliquis  versus aspirin  monotherapy.  Obtain echocardiogram.  1/25: In the interim I had a discussion with Dr. Waddell regarding Eliquis  therapy and the patient was changed to aspirin  monotherapy only and Eliquis  was discontinued.  His echocardiogram demonstrated ejection fraction of 45 to 50%.  He was seen by his primary care physician last week due to URI like  symptoms and was treated with azithromycin  and prednisone .  Of note his rhythm was irregular.  The patient has been doing very well.  After getting over his URI which was perhaps a COPD exacerbation he has been feeling very well.  He denies any chest pain, ICD shocks, peripheral edema, or paroxysmal nocturnal dyspnea.  He is able to do everything he needs to do in a day.  He was able to enjoy hunting this season and did shoot to deer which he was proud of.  He fortunately has not required any emergency room visits or hospitalizations.  He is tolerating his medical therapy well without issues.  Occasionally gets some orthostatic symptoms when he goes from sitting to standing quickly.  He has had no syncope.  Overall he feels very well.       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 difficile colitis 06/16/2014   CAD (coronary artery disease)    a. s/p 3v CABG 2010. b. NSTEMI 05/2014 in setting of VT. Cath impressions: Recent IMI with occluded SVG to RCA. Has Right to Right collaterals and collaterals from septal and OM. EF 40%.   Carotid disease, bilateral (HCC)    a. Right CEA 2002; known occluded left carotid. b. Duplex 06/2014: known occluded LICA, stable 1-39% RICA s/p CEA.   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 Prior EF 36%. b. 2014: 50%. c. 05/2014: EF 35-40% by echo, 40% with inf HK by cath.   NSTEMI (non-ST elevation myocardial infarction) (HCC) 05/08/2014   Pleural effusion 05/08/2014   Pneumonia 05/23/2014   Presence of permanent cardiac pacemaker    Prostate hypertrophy    PVC's (premature ventricular contractions)    a. PVC's with prior Holter showing PVC load of 22%.   Shortness of breath dyspnea    Stroke (HCC) 08/09/1999   TIA (transient ischemic attack)    ASPVD, S./P. right CEA, 2002, and known occluded left carotid   Ventricular tachycardia (HCC)    a. Admitted with VT 05/2013 - EPS with inducible sustained monomorphic VT; s/p [employer] ICD 05/21/14.     Current Medications: Current Meds  Medication Sig   aspirin  EC 81 MG tablet Take 1 tablet (81 mg total) by mouth daily. Swallow whole.   atorvastatin  (LIPITOR ) 80 MG tablet TAKE 1 TABLET BY MOUTH ONCE DAILY AT  6PM   carvedilol  (COREG ) 25 MG tablet Take 1 tablet (25 mg total) by mouth 2 (two) times daily with a meal.   diphenhydramine -acetaminophen  (TYLENOL  PM) 25-500 MG TABS  tablet Take 1 tablet by mouth at bedtime as needed (sleep).   empagliflozin  (JARDIANCE ) 25 MG TABS tablet Take 1 tablet (25 mg total) by mouth daily before breakfast.   ezetimibe  (ZETIA ) 10 MG tablet Take 1 tablet by mouth once daily   meclizine  (ANTIVERT ) 25 MG tablet Take 25 mg by mouth 3 (three) times daily as needed for dizziness.   nilotinib  (TASIGNA ) 50 MG capsule Take 2 capsules (100 mg total) by mouth every 12 (twelve) hours. Take on empty stomach, at  least one hour before or two hours after food. (Patient taking differently: Take 50 mg by mouth every 12 (twelve) hours. Take on empty stomach, at least one hour before or two hours after food.)   nitroGLYCERIN  (NITROSTAT ) 0.4 MG SL tablet Place 0.4 mg under the tongue every 5 (five) minutes as needed for chest pain.     Allergies:    Breztri aerosphere [budeson-glycopyrrol-formoterol], Norvasc [amlodipine besylate], and Sulfa antibiotics   Social History:   Social History   Tobacco Use   Smoking status: Former    Current packs/day: 0.00    Average packs/day: 3.0 packs/day for 45.0 years (135.0 ttl pk-yrs)    Types: Cigarettes    Start date: 08/08/1952    Quit date: 08/08/1997    Years since quitting: 26.0    Passive exposure: Past   Smokeless tobacco: Former  Building Services Engineer status: Never Used  Substance Use Topics   Alcohol use: No    Alcohol/week: 0.0 standard drinks of alcohol   Drug use: No     Family Hx: Family History  Problem Relation Age of Onset   Heart disease Mother    Alcohol abuse Father      Review of Systems:   Please see the history of present illness.    All other systems reviewed and are negative.     EKGs/Labs/Other Test Reviewed:    EKG:    EKG Interpretation Date/Time:  Thursday August 17 2023 08:18:13 EST Ventricular Rate:  61 PR Interval:  176 QRS Duration:  158 QT Interval:  472 QTC Calculation: 475 R Axis:   -80  Text Interpretation: Normal sinus rhythm Left axis deviation Right  bundle branch block When compared with ECG of 17-Feb-2023 11:49, PR interval has decreased Confirmed by Wendel Haws ([id number]) on 08/17/2023 8:20:26 AM         Prior CV studies reviewed:  Cardiac Studies & Procedures     STRESS TESTS  MYOCARDIAL PERFUSION IMAGING 04/13/2017  Narrative  Nuclear stress EF: 35%.  Inferior/inferoseptal defect consistent iwht scar No significant ischemia  Intermediate risk study  ECHOCARDIOGRAM  ECHOCARDIOGRAM COMPLETE 03/14/2023  Narrative ECHOCARDIOGRAM REPORT    Patient Name:   Anthony Wolfe Date of Exam: 03/14/2023 Medical Rec #:  [id number]      Height:       69.0 in Accession #:    [id number]     Weight:       184.8 lb Date of Birth:  1945-11-24      BSA:          1.998 m Patient Age:    77 years       BP:           118/70 mmHg Patient Gender: M              HR:           64 bpm. Exam Location:  [address]  Procedure: 2D Echo, 3D Echo, Cardiac Doppler and Color Doppler  Indications:    I48.91 Atrial Flutter/Fibrillation (status post Ablation)  History:        Patient has prior history of Echocardiogram examinations, most recent 10/27/2021. CHF, CAD, Pacemaker, Defibrillator, Abnormal ECG and Prior CABG, COPD; Arrythmias:Atrial Fibrillation and Atrial Flutter. Ischemic Cardiomyopathy (prior EF 40-45%).  Sonographer:    Heather Hawks RDCS Referring Phys: Prisilla Kocsis K Keesha Pellum  IMPRESSIONS   1. Left ventricular ejection fraction, by estimation, is 45 to 50%. Left ventricular ejection fraction by 3D volume  is 50 %. The left ventricle has mildly decreased function. The left ventricle demonstrates regional wall motion abnormalities (see scoring diagram/findings for description). There is mild concentric left ventricular hypertrophy. Left ventricular diastolic parameters are consistent with Grade I diastolic dysfunction (impaired relaxation). There is moderate hypokinesis of the left ventricular, entire inferior wall. 2. Right ventricular  systolic function is normal. The right ventricular size is normal. Tricuspid regurgitation signal is inadequate for assessing PA pressure. 3. Left atrial size was moderately dilated. 4. Right atrial size was moderately dilated. 5. The mitral valve is normal in structure. Mild mitral valve regurgitation. 6. The aortic valve is tricuspid. Aortic valve regurgitation is not visualized. 7. There is borderline dilatation of the aortic root, measuring 38 mm. 8. The inferior vena cava is normal in size with greater than 50% respiratory variability, suggesting right atrial pressure of 3 mmHg.  Comparison(s): No significant change from prior study. Prior images reviewed side by side.  FINDINGS Left Ventricle: Left ventricular ejection fraction, by estimation, is 45 to 50%. Left ventricular ejection fraction by 3D volume is 50 %. The left ventricle has mildly decreased function. The left ventricle demonstrates regional wall motion abnormalities. Moderate hypokinesis of the left ventricular, entire inferior wall. The left ventricular internal cavity size was normal in size. There is mild concentric left ventricular hypertrophy. Abnormal (paradoxical) septal motion, consistent with RV pacemaker. Left ventricular diastolic parameters are consistent with Grade I diastolic dysfunction (impaired relaxation). Indeterminate filling pressures.   LV Wall Scoring: The inferior wall and mid inferolateral segment are hypokinetic.  Right Ventricle: The right ventricular size is normal. No increase in right ventricular wall thickness. Right ventricular systolic function is normal. Tricuspid regurgitation signal is inadequate for assessing PA pressure.  Left Atrium: Left atrial size was moderately dilated.  Right Atrium: Right atrial size was moderately dilated.  Pericardium: There is no evidence of pericardial effusion.  Mitral Valve: The mitral valve is normal in structure. Mild mitral valve regurgitation, with  centrally-directed jet.  Tricuspid Valve: The tricuspid valve is normal in structure. Tricuspid valve regurgitation is not demonstrated.  Aortic Valve: The aortic valve is tricuspid. Aortic valve regurgitation is not visualized.  Pulmonic Valve: The pulmonic valve was normal in structure. Pulmonic valve regurgitation is mild to moderate. No evidence of pulmonic stenosis.  Aorta: The aortic root is normal in size and structure. There is borderline dilatation of the aortic root, measuring 38 mm.  Venous: The inferior vena cava is normal in size with greater than 50% respiratory variability, suggesting right atrial pressure of 3 mmHg.  IAS/Shunts: No atrial level shunt detected by color flow Doppler.  Additional Comments: A device lead is visualized in the right ventricle.   LEFT VENTRICLE PLAX 2D LVIDd:         5.40 cm         Diastology LVIDs:         4.20 cm         LV e' medial:    6.64 cm/s LV PW:         1.30 cm         LV E/e' medial:  13.9 LV IVS:        1.00 cm         LV e' lateral:   8.00 cm/s LVOT diam:     2.70 cm         LV E/e' lateral: 11.6 LV SV:         87 LV SV Index:  44 LVOT Area:     5.73 cm        3D Volume EF LV 3D EF:    Left ventricul ar ejection fraction by 3D volume is 50 %.  3D Volume EF: 3D EF:        50 % LV EDV:       219 ml LV ESV:       110 ml LV SV:        109 ml  RIGHT VENTRICLE RV Basal diam:  4.10 cm RV Mid diam:    3.60 cm  LEFT ATRIUM             Index        RIGHT ATRIUM           Index LA diam:        4.60 cm 2.30 cm/m   RA Pressure: 3.00 mmHg LA Vol (A2C):   90.1 ml 45.10 ml/m  RA Area:     24.40 cm LA Vol (A4C):   99.6 ml 49.86 ml/m  RA Volume:   78.10 ml  39.09 ml/m LA Biplane Vol: 97.5 ml 48.80 ml/m AORTIC VALVE LVOT Vmax:   72.05 cm/s LVOT Vmean:  43.700 cm/s LVOT VTI:    0.152 m  AORTA Ao Root diam: 3.80 cm Ao Asc diam:  3.20 cm  MITRAL VALVE               TRICUSPID VALVE MV Area (PHT): cm          Estimated RAP:  3.00 mmHg MV Decel Time: 205 msec MV E velocity: 92.58 cm/s  SHUNTS MV A velocity: 99.15 cm/s  Systemic VTI:  0.15 m MV E/A ratio:  0.93        Systemic Diam: 2.70 cm  Jerel Croitoru MD Electronically signed by Jerel Balding MD Signature Date/Time: 03/14/2023/4:33:51 PM    Final             Other studies Reviewed: Chest CT 2018 demonstrates aortic atherosclerosis   Recent Labs: 10/26/2022: Pro B Natriuretic peptide (BNP) 616.0; TSH 4.13 05/29/2023: ALT 13; BUN 15; Creatinine, Ser 0.97; Hemoglobin 12.1; Platelets 174; Potassium 4.4; Sodium 144   Lipid Panel    Component Value Date/Time   CHOL 97 03/10/2023 0823   CHOL 84 (L) 11/02/2022 1403   TRIG 78 03/10/2023 0823   HDL 39 (L) 03/10/2023 0823   HDL 34 (L) 11/02/2022 1403   CHOLHDL 2.5 03/10/2023 0823   VLDL 20 04/05/2016 0830   LDLCALC 42 03/10/2023 0823    Risk Assessment/Calculations:     CHA2DS2-VASc Score = 5   This indicates a 7.2% annual risk of stroke. The patient's score is based upon: CHF History: 1 HTN History: 1 Diabetes History: 0 Stroke History: 0 Vascular Disease History: 1 Age Score: 2 Gender Score: 0              Physical Exam:    VS:  BP 135/80   Pulse 63   Ht 5' 9 (1.753 m)   Wt 182 lb 6.4 oz (82.7 kg)   SpO2 95%   BMI 26.94 kg/m    Wt Readings from Last 3 Encounters:  08/17/23 182 lb 6.4 oz (82.7 kg)  08/10/23 180 lb (81.6 kg)  08/07/23 183 lb (83 kg)         GENERAL:  No apparent distress, AOx3 HEENT:  No carotid bruits, +2 carotid impulses, no scleral icterus CAR: RRR no murmurs, gallops, rubs, or thrills  RES:  Clear to auscultation bilaterally ABD:  Soft, nontender, nondistended, positive bowel sounds x 4 VASC:  +2 radial pulses, +2 carotid pulses, palpable pedal pulses NEURO:  CN 2-12 grossly intact; motor and sensory grossly intact PSYCH:  No active depression or anxiety EXT:  No edema, ecchymosis, or cyanosis  Signed, Lurena MARLA Red, MD   08/17/2023 8:53 AM    [facility] [address] [place], [place], [place]  [address] Phone: [phone]; Fax: [phone]   Note:  This document was prepared using Dragon voice recognition software and may include unintentional dictation errors.

## 2023-08-14 NOTE — Telephone Encounter (Signed)
 Oral Oncology Patient Advocate Encounter  Patient dropped of LIS denial letter at office. Letter submitted to [employer] via e-fax [phone]  Estefana Moellers, CPhT-Adv Oncology Pharmacy Patient Advocate [facility] Direct Number: [phone]  Fax: [phone]

## 2023-08-17 ENCOUNTER — Ambulatory Visit: Payer: Medicare Other | Attending: Internal Medicine | Admitting: Internal Medicine

## 2023-08-17 ENCOUNTER — Encounter: Payer: Self-pay | Admitting: Internal Medicine

## 2023-08-17 VITALS — BP 135/80 | HR 63 | Ht 69.0 in | Wt 182.4 lb

## 2023-08-17 DIAGNOSIS — I255 Ischemic cardiomyopathy: Secondary | ICD-10-CM | POA: Diagnosis not present

## 2023-08-17 DIAGNOSIS — Z95810 Presence of automatic (implantable) cardiac defibrillator: Secondary | ICD-10-CM

## 2023-08-17 DIAGNOSIS — I4891 Unspecified atrial fibrillation: Secondary | ICD-10-CM

## 2023-08-17 DIAGNOSIS — E785 Hyperlipidemia, unspecified: Secondary | ICD-10-CM

## 2023-08-17 DIAGNOSIS — I483 Typical atrial flutter: Secondary | ICD-10-CM

## 2023-08-17 DIAGNOSIS — I2510 Atherosclerotic heart disease of native coronary artery without angina pectoris: Secondary | ICD-10-CM | POA: Diagnosis not present

## 2023-08-17 DIAGNOSIS — I700 Atherosclerosis of aorta: Secondary | ICD-10-CM | POA: Diagnosis not present

## 2023-08-17 DIAGNOSIS — I779 Disorder of arteries and arterioles, unspecified: Secondary | ICD-10-CM

## 2023-08-17 MED ORDER — EMPAGLIFLOZIN 25 MG PO TABS: 25.0000 mg | ORAL_TABLET | Freq: Every day | ORAL | 3 refills | Status: AC

## 2023-08-17 NOTE — Addendum Note (Signed)
 Addended by: Lendon Ka on: 08/17/2023 02:56 PM   Modules accepted: Orders

## 2023-08-17 NOTE — Patient Instructions (Addendum)
 Medication Instructions:  Your physician has recommended you make the following change in your medication:  1.) increase Jardiance  to 25 mg - one tablet daily   *If you need a refill on your cardiac medications before your next appointment, please call your pharmacy*   Lab Work: None If you have labs (blood work) drawn today and your tests are completely normal, you will receive your results only by: MyChart Message (if you have MyChart) OR A paper copy in the mail If you have any lab test that is abnormal or we need to change your treatment, we will call you to review the results.   Testing/Procedures: none   Follow-Up: At [facility], you and your health needs are our priority.  As part of our continuing mission to provide you with exceptional heart care, we have created designated Provider Care Teams.  These Care Teams include your primary Cardiologist (physician) and Advanced Practice Providers (APPs -  Physician Assistants and Nurse Practitioners) who all work together to provide you with the care you need, when you need it.  We recommend signing up for the patient portal called MyChart.  Sign up information is provided on this After Visit Summary.  MyChart is used to connect with patients for Virtual Visits (Telemedicine).  Patients are able to view lab/test results, encounter notes, upcoming appointments, etc.  Non-urgent messages can be sent to your provider as well.   To learn more about what you can do with MyChart, go to [email or web address].    Your next appointment:   6 month(s)  Provider:   Arun K Thukkani, MD   Other Instructions  Addendum:  reviewed with patient assistance pharmacy tech - was advised to send Jardiance  25 mg prescription to [facility] in [facility] since the patient has been getting the 10 mg dose from the drug company already.  Prescription sent.

## 2023-08-25 ENCOUNTER — Ambulatory Visit (INDEPENDENT_AMBULATORY_CARE_PROVIDER_SITE_OTHER): Payer: Medicare Other | Admitting: Family Medicine

## 2023-08-25 VITALS — BP 130/64 | HR 86 | Temp 98.3°F | Ht 69.0 in | Wt 183.0 lb

## 2023-08-25 DIAGNOSIS — J189 Pneumonia, unspecified organism: Secondary | ICD-10-CM | POA: Diagnosis not present

## 2023-08-25 MED ORDER — LEVOFLOXACIN 500 MG PO TABS: 500.0000 mg | ORAL_TABLET | Freq: Every day | ORAL | 0 refills | Status: AC

## 2023-08-25 NOTE — Progress Notes (Signed)
Subjective:    Patient ID: Anthony Wolfe, male    DOB: April 15, 1946, 78 y.o.   MRN: [id number] 08/10/23 Patient is a very pleasant 78 year old Caucasian gentleman with a history of coronary artery disease status post myocardial infarction, congestive heart failure with reduced ejection fraction, paroxysmal atrial fibrillation, history of bilateral carotid artery stenosis, COPD, and CML.   Patient states that he has been sick for over a week.  He is now short of breath.  He has a cough productive of yellow and green sputum.  He is wheezing.  He has diminished breath sounds bilaterally with expiratory wheezing in all 4 lung fields.  He denies any fever or chills but he does report feeling sick and weak.  He states that he feels sicker today than he did when he had pneumonia. At that time, my plan was; Start the patient on a Z-Pak coupled with prednisone 40 mg daily for 7 days coupled with albuterol 2 puffs every 4-6 hours as needed for wheezing.  Seek medical attention immediately if symptoms worsen.  08/25/23 Patient states that he started to feel better within a few days of starting the prednisone and albuterol.  His breathing improved dramatically.  However over the last 3 days his situation is worsening.  He reports cough productive of brown sputum.  He reports shortness of breath.  He reports headache with coughing.  He reports feeling weak and tired.  He denies any sinus pain.  He denies any sore throat.  He denies any head congestion.  On examination, he has prominent crackles in the left lower lobe.  He has diminished breath sounds throughout with faint expiratory wheezing. 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 difficile colitis 06/16/2014   CAD (coronary artery disease)    a. s/p 3v CABG 2010. b. NSTEMI 05/2014 in setting of VT.  Cath impressions: "Recent IMI with occluded SVG to RCA. Has Right to Right collaterals and collaterals from septal and OM. EF 40%."   Carotid disease, bilateral (HCC)    a. Right CEA 2002; known occluded left carotid. b. Duplex 06/2014: known occluded LICA, stable 1-39% RICA s/p CEA.   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 Prior EF 36%. b. 2014: 50%. c. 05/2014: EF 35-40% by echo, 40% with inf HK by cath.   NSTEMI (non-ST elevation myocardial infarction) (HCC) 05/08/2014   Pleural effusion 05/08/2014   Pneumonia 05/23/2014   Presence of permanent cardiac pacemaker    Prostate hypertrophy    PVC's (premature ventricular contractions)    a. PVC's with prior Holter showing PVC load of 22%.   Shortness of breath dyspnea    Stroke (HCC) 08/09/1999   TIA (transient ischemic attack)    ASPVD, S./P. right CEA, 2002, and known occluded left carotid   Ventricular tachycardia (HCC)    a. Admitted with VT 05/2013 - EPS with inducible sustained monomorphic VT; s/p Medtronic ICD 05/21/14.   Past Surgical History:  Procedure Laterality Date   A-FLUTTER ABLATION N/A 01/06/2023   Procedure: A-FLUTTER ABLATION;  Surgeon: Lewayne Bunting  W, MD;  Location: [facility] INVASIVE CV LAB;  Service: Cardiovascular;  Laterality: N/A;   BACK SURGERY     CARDIOVERSION N/A 01/04/2022   Procedure: CARDIOVERSION;  Surgeon: Parke Poisson, MD;  Location: [facility] ENDOSCOPY;  Service: Cardiovascular;  Laterality: N/A;   CARDIOVERSION N/A 11/24/2022   Procedure: CARDIOVERSION;  Surgeon: Thomasene Ripple, DO;  Location: [facility] INVASIVE CV LAB;  Service: Cardiovascular;  Laterality: N/A;   CAROTID ENDARTERECTOMY Right 01   carotidectomy  2003   right side   COLONOSCOPY  07/2010   CORONARY ARTERY BYPASS GRAFT  02/2009   3 vessel    ELECTROPHYSIOLOGIC STUDY  05/21/14   ELECTROPHYSIOLOGY STUDY N/A 05/21/2014   Procedure: ELECTROPHYSIOLOGY STUDY;  Surgeon: Duke Salvia, MD;  Location: [facility] CATH LAB;  Service: Cardiovascular;  Laterality: N/A;   EP IMPLANTABLE DEVICE  05/21/14   single chamber Metronic ICD   IR THORACENTESIS ASP PLEURAL SPACE W/IMG GUIDE  12/07/2021   LEFT HEART CATHETERIZATION WITH CORONARY/GRAFT ANGIOGRAM N/A 05/19/2014   Procedure: LEFT HEART CATHETERIZATION WITH Isabel Caprice;  Surgeon: Wendall Stade, MD;  Location: [facility] CATH LAB;  Service: Cardiovascular;  Laterality: N/A;   PLEURAL BIOPSY N/A 11/27/2014   Procedure: PLEURAL BIOPSY;  Surgeon: [name], MD;  Location: [facility] OR;  Service: Thoracic;  Laterality: N/A;   PLEURAL EFFUSION DRAINAGE Right 11/27/2014   Procedure: DRAINAGE OF PLEURAL EFFUSION;  Surgeon: [name], MD;  Location: [facility] OR;  Service: Thoracic;  Laterality: Right;   TALC PLEURODESIS Right 11/27/2014   Procedure: Lurlean Nanny;  Surgeon: [name], MD;  Location: [facility] OR;  Service: Thoracic;  Laterality: Right;   VIDEO ASSISTED THORACOSCOPY Right 11/27/2014   Procedure: RIGHT VIDEO ASSISTED THORACOSCOPY;  Surgeon: [name], MD;  Location: [facility] OR;  Service: Thoracic;  Laterality: Right;   Current Outpatient Medications on File Prior to Visit  Medication Sig Dispense Refill   aspirin EC 81 MG tablet Take 1 tablet (81 mg total) by mouth daily. Swallow whole.     atorvastatin (LIPITOR) 80 MG tablet TAKE 1 TABLET BY MOUTH ONCE DAILY AT  6PM 90 tablet 2   carvedilol (COREG) 25 MG tablet Take 1 tablet (25 mg total) by mouth 2 (two) times daily with a meal. 180 tablet 3   diphenhydramine-acetaminophen (TYLENOL PM) 25-500 MG TABS tablet Take 1 tablet by mouth at bedtime as needed (sleep).     empagliflozin (JARDIANCE) 25 MG TABS tablet Take 1 tablet (25 mg total) by mouth daily before breakfast. 90 tablet 3   ezetimibe (ZETIA) 10 MG tablet Take 1  tablet by mouth once daily 90 tablet 2   furosemide (LASIX) 20 MG tablet Take 1 tablet (20 mg total) by mouth as needed. Take 1 tablet daily as needed for swelling. 90 tablet 3   meclizine (ANTIVERT) 25 MG tablet Take 25 mg by mouth 3 (three) times daily as needed for dizziness.     nilotinib (TASIGNA) 50 MG capsule Take 2 capsules (100 mg total) by mouth every 12 (twelve) hours. Take on empty stomach, at least one hour before or two hours after food. (Patient taking differently: Take 50 mg by mouth every 12 (twelve) hours. Take on empty stomach, at least one hour before or two hours after food.) 360 capsule 0   nitroGLYCERIN (NITROSTAT) 0.4 MG SL tablet Place 0.4 mg under the tongue every 5 (five) minutes as needed for chest pain.     No current facility-administered medications on file prior to  visit.   Allergies  Allergen Reactions   Breztri Aerosphere [Budeson-Glycopyrrol-Formoterol] Swelling    Swelling of mouth tongue    Norvasc [Amlodipine Besylate] Rash    rash   Sulfa Antibiotics Hives    Hives    Social History   Socioeconomic History   Marital status: Married    Spouse name: Not on file   Number of children: 2   Years of education: Not on file   Highest education level: Not on file  Occupational History    Comment: retired Administrator  Tobacco Use   Smoking status: Former    Current packs/day: 0.00    Average packs/day: 3.0 packs/day for 45.0 years (135.0 ttl pk-yrs)    Types: Cigarettes    Start date: 08/08/1952    Quit date: 08/08/1997    Years since quitting: 26.0    Passive exposure: Past   Smokeless tobacco: Former  Building services engineer status: Never Used  Substance and Sexual Activity   Alcohol use: No    Alcohol/week: 0.0 standard drinks of alcohol   Drug use: No   Sexual activity: Yes  Other Topics Concern   Not on file  Social History Narrative   Not on file   Social Drivers of Health   Financial Resource Strain: Low Risk  (11/17/2022)   Overall  Financial Resource Strain (CARDIA)    Difficulty of Paying Living Expenses: Not hard at all  Food Insecurity: No Food Insecurity (11/17/2022)   Hunger Vital Sign    Worried About Running Out of Food in the Last Year: Never true    Ran Out of Food in the Last Year: Never true  Transportation Needs: No Transportation Needs (11/17/2022)   PRAPARE - Administrator, Civil Service (Medical): No    Lack of Transportation (Non-Medical): No  Physical Activity: Insufficiently Active (11/11/2021)   Exercise Vital Sign    Days of Exercise per Week: 3 days    Minutes of Exercise per Session: 30 min  Stress: No Stress Concern Present (11/17/2022)   [employer] of Occupational Health - Occupational Stress Questionnaire    Feeling of Stress : Not at all  Social Connections: Socially Integrated (11/17/2022)   Social Connection and Isolation Panel [NHANES]    Frequency of Communication with Friends and Family: More than three times a week    Frequency of Social Gatherings with Friends and Family: More than three times a week    Attends Religious Services: More than 4 times per year    Active Member of [employer] or Organizations: Yes    Attends Banker Meetings: More than 4 times per year    Marital Status: Married  Catering manager Violence: Not At Risk (11/17/2022)   Humiliation, Afraid, Rape, and Kick questionnaire    Fear of Current or Ex-Partner: No    Emotionally Abused: No    Physically Abused: No    Sexually Abused: No      Review of Systems  Gastrointestinal:  Positive for diarrhea.  All other systems reviewed and are negative.      Objective:   Physical Exam Vitals reviewed.  Constitutional:      General: He is not in acute distress.    Appearance: Normal appearance. He is well-developed. He is not ill-appearing, toxic-appearing or diaphoretic.  Cardiovascular:     Rate and Rhythm: Normal rate. Rhythm irregularly irregular.     Heart sounds: Normal heart  sounds. No murmur heard. Pulmonary:  Effort: Pulmonary effort is normal. No tachypnea, accessory muscle usage, prolonged expiration or respiratory distress.     Breath sounds: Decreased air movement present. Decreased breath sounds, wheezing and rales present. No rhonchi.    Abdominal:     General: Bowel sounds are normal. There is no distension.     Palpations: Abdomen is soft. There is no mass.     Tenderness: There is no abdominal tenderness. There is no guarding or rebound.  Musculoskeletal:     Right lower leg: No edema.     Left lower leg: No edema.  Neurological:     Mental Status: He is alert.           Assessment & Plan:  Pneumonia of left lower lobe due to infectious organism Patient appears to developed a secondary pneumonia in the left lower lobe.  Begin Levaquin 500 milligrams a day for 7 days.  Resume Breztri 2 puffs inhaled twice daily.  Continue to use albuterol 2 puffs every 6 hours as needed.  Reassess next week.  If not improving recommend imaging of the lungs to evaluate further.  I have already placed the order for the chest x-ray today.  He can go and get that Monday if his breathing is not improving.  Seek medical attention immediately over the weekend if worsening.

## 2023-08-28 ENCOUNTER — Ambulatory Visit (INDEPENDENT_AMBULATORY_CARE_PROVIDER_SITE_OTHER): Payer: Medicare Other

## 2023-08-28 DIAGNOSIS — I255 Ischemic cardiomyopathy: Secondary | ICD-10-CM | POA: Diagnosis not present

## 2023-08-28 DIAGNOSIS — I5022 Chronic systolic (congestive) heart failure: Secondary | ICD-10-CM

## 2023-08-28 LAB — CUP PACEART REMOTE DEVICE CHECK
Battery Remaining Longevity: 18 mo
Battery Voltage: 2.95 V
Brady Statistic RV Percent Paced: 0.12 %
Date Time Interrogation Session: 20250120044223
HighPow Impedance: 46 Ohm
Implantable Lead Connection Status: 753985
Implantable Lead Implant Date: 20151014
Implantable Lead Location: 753860
Implantable Pulse Generator Implant Date: 20151014
Lead Channel Impedance Value: 399 Ohm
Lead Channel Impedance Value: 456 Ohm
Lead Channel Pacing Threshold Amplitude: 0.625 V
Lead Channel Pacing Threshold Pulse Width: 0.4 ms
Lead Channel Sensing Intrinsic Amplitude: 2.375 mV
Lead Channel Sensing Intrinsic Amplitude: 2.375 mV
Lead Channel Setting Pacing Amplitude: 2 V
Lead Channel Setting Pacing Pulse Width: 0.4 ms
Lead Channel Setting Sensing Sensitivity: 0.3 mV
Zone Setting Status: 755011

## 2023-08-29 ENCOUNTER — Inpatient Hospital Stay: Payer: Medicare Other | Attending: Hematology and Oncology

## 2023-08-29 ENCOUNTER — Other Ambulatory Visit: Payer: Self-pay | Admitting: Hematology and Oncology

## 2023-08-29 ENCOUNTER — Ambulatory Visit (HOSPITAL_COMMUNITY)
Admission: RE | Admit: 2023-08-29 | Discharge: 2023-08-29 | Disposition: A | Discharge status: Home / Self Care | Payer: Medicare Other | Source: Ambulatory Visit | Attending: Family Medicine | Admitting: Family Medicine

## 2023-08-29 ENCOUNTER — Other Ambulatory Visit: Payer: Self-pay

## 2023-08-29 DIAGNOSIS — J189 Pneumonia, unspecified organism: Secondary | ICD-10-CM | POA: Insufficient documentation

## 2023-08-29 DIAGNOSIS — R059 Cough, unspecified: Secondary | ICD-10-CM | POA: Diagnosis not present

## 2023-08-29 DIAGNOSIS — R0602 Shortness of breath: Secondary | ICD-10-CM | POA: Diagnosis not present

## 2023-08-29 DIAGNOSIS — C9210 Chronic myeloid leukemia, BCR/ABL-positive, not having achieved remission: Secondary | ICD-10-CM

## 2023-08-29 DIAGNOSIS — D6481 Anemia due to antineoplastic chemotherapy: Secondary | ICD-10-CM | POA: Insufficient documentation

## 2023-08-29 DIAGNOSIS — Z95810 Presence of automatic (implantable) cardiac defibrillator: Secondary | ICD-10-CM | POA: Diagnosis not present

## 2023-08-29 LAB — CBC WITH DIFFERENTIAL/PLATELET
Abs Immature Granulocytes: 0.03 10*3/uL (ref 0.00–0.07)
Basophils Absolute: 0.1 10*3/uL (ref 0.0–0.1)
Basophils Relative: 1 %
Eosinophils Absolute: 0 10*3/uL (ref 0.0–0.5)
Eosinophils Relative: 0 %
HCT: 32.4 % — ABNORMAL LOW (ref 39.0–52.0)
Hemoglobin: 10.7 g/dL — ABNORMAL LOW (ref 13.0–17.0)
Immature Granulocytes: 0 %
Lymphocytes Relative: 7 %
Lymphs Abs: 0.7 10*3/uL (ref 0.7–4.0)
MCH: 30.9 pg (ref 26.0–34.0)
MCHC: 33 g/dL (ref 30.0–36.0)
MCV: 93.6 fL (ref 80.0–100.0)
Monocytes Absolute: 0.8 10*3/uL (ref 0.1–1.0)
Monocytes Relative: 8 %
Neutro Abs: 7.7 10*3/uL (ref 1.7–7.7)
Neutrophils Relative %: 84 %
Platelets: 242 10*3/uL (ref 150–400)
RBC: 3.46 MIL/uL — ABNORMAL LOW (ref 4.22–5.81)
RDW: 15.1 % (ref 11.5–15.5)
WBC: 9.2 10*3/uL (ref 4.0–10.5)
nRBC: 0 % (ref 0.0–0.2)

## 2023-08-29 LAB — COMPREHENSIVE METABOLIC PANEL
ALT: 15 U/L (ref 0–44)
AST: 16 U/L (ref 15–41)
Albumin: 2.8 g/dL — ABNORMAL LOW (ref 3.5–5.0)
Alkaline Phosphatase: 73 U/L (ref 38–126)
Anion gap: 7 (ref 5–15)
BUN: 15 mg/dL (ref 8–23)
CO2: 28 mmol/L (ref 22–32)
Calcium: 8.4 mg/dL — ABNORMAL LOW (ref 8.9–10.3)
Chloride: 108 mmol/L (ref 98–111)
Creatinine, Ser: 0.9 mg/dL (ref 0.61–1.24)
GFR, Estimated: >60 mL/min (ref >60)
Glucose, Bld: 105 mg/dL — ABNORMAL HIGH (ref 70–99)
Potassium: 3.6 mmol/L (ref 3.5–5.1)
Sodium: 143 mmol/L (ref 135–145)
Total Bilirubin: 0.7 mg/dL (ref 0.0–1.2)
Total Protein: 6.3 g/dL — ABNORMAL LOW (ref 6.5–8.1)

## 2023-09-06 LAB — BCR-ABL1, CML/ALL, PCR, QUANT
E1A2 Transcript: <0.0032 %
Interpretation (BCRAL):: NEGATIVE
b2a2 transcript: <0.0032 %
b3a2 transcript: <0.0032 %

## 2023-09-08 ENCOUNTER — Encounter: Payer: Self-pay | Admitting: Hematology and Oncology

## 2023-09-08 ENCOUNTER — Inpatient Hospital Stay: Payer: Medicare Other | Admitting: Hematology and Oncology

## 2023-09-08 VITALS — BP 147/82 | HR 85 | Temp 97.7°F | Resp 18 | Ht 69.0 in | Wt 181.0 lb

## 2023-09-08 DIAGNOSIS — D6481 Anemia due to antineoplastic chemotherapy: Secondary | ICD-10-CM | POA: Diagnosis not present

## 2023-09-08 DIAGNOSIS — T451X5A Adverse effect of antineoplastic and immunosuppressive drugs, initial encounter: Secondary | ICD-10-CM | POA: Diagnosis not present

## 2023-09-08 DIAGNOSIS — C9210 Chronic myeloid leukemia, BCR/ABL-positive, not having achieved remission: Secondary | ICD-10-CM | POA: Diagnosis not present

## 2023-09-08 NOTE — Assessment & Plan Note (Signed)
I have reviewed test results with the patient and his wife He is compliant taking his medication as directed His recent molecular test showed undetectable BCR/ABL; he is still in major molecular response For now, we will continue close monitoring and follow-up in 3 months

## 2023-09-08 NOTE — Progress Notes (Signed)
[facility] OFFICE PROGRESS NOTE  Patient Care Team: [name], MD as PCP - General (Family Medicine) [name], MD as PCP - Cardiology (Cardiology) [name], MD as PCP - Electrophysiology (Cardiology) [name], MD as Attending Physician (Cardiology) [name], MD (Family Medicine) [name], MD (Cardiothoracic Surgery) Early, [name], MD (Inactive) as Attending Physician (Vascular Surgery) [name], MD as Consulting Physician (Hematology and Oncology) Duke Salvia, MD as Consulting Physician (Cardiology) Erroll Luna, [facility] (Inactive) as Pharmacist (Pharmacist) Earlene Plater, Rita Ohara, [facility] (Inactive) as Pharmacist (Pharmacist) [facility]  ASSESSMENT & PLAN:  Chronic myelogenous leukemia I have reviewed test results with the patient and his wife He is compliant taking his medication as directed His recent molecular test showed undetectable BCR/ABL; he is still in major molecular response For now, we will continue close monitoring and follow-up in 3 months  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 The patient denies recent history of bleeding such as epistaxis, hematuria or hematochezia. He is asymptomatic from the anemia. We will observe for now.  No orders of the defined types were placed in this encounter.   All questions were answered. The patient knows to call the clinic with any problems, questions or concerns. The total time spent in the appointment was 25 minutes encounter with patients including review of chart and various tests results, discussions about plan of care and coordination of care plan   [name], MD 09/08/2023 9:55 AM  INTERVAL HISTORY: Please see below for problem oriented charting. he returns for chemo follow-up on the lab today for CML The patient had pneumonia 2 weeks ago but is fully recovered Recent chest x-ray showed no  evidence of pleural effusion Overall, he tolerated treatment well with no side effects  REVIEW OF SYSTEMS:   Constitutional: Denies fevers, chills or abnormal weight loss Eyes: Denies blurriness of vision Ears, nose, mouth, throat, and face: Denies mucositis or sore throat Respiratory: Denies cough, dyspnea or wheezes Cardiovascular: Denies palpitation, chest discomfort or lower extremity swelling Gastrointestinal:  Denies nausea, heartburn or change in bowel habits Skin: Denies abnormal skin rashes Lymphatics: Denies new lymphadenopathy or easy bruising Neurological:Denies numbness, tingling or new weaknesses Behavioral/Psych: Mood is stable, no new changes  All other systems were reviewed with the patient and are negative.  I have reviewed the past medical history, past surgical history, social history and family history with the patient and they are unchanged from previous note.  ALLERGIES:  is allergic to breztri aerosphere [budeson-glycopyrrol-formoterol], norvasc [amlodipine besylate], and sulfa antibiotics.  MEDICATIONS:  Current Outpatient Medications  Medication Sig Dispense Refill   aspirin EC 81 MG tablet Take 1 tablet (81 mg total) by mouth daily. Swallow whole.     atorvastatin (LIPITOR) 80 MG tablet TAKE 1 TABLET BY MOUTH ONCE DAILY AT  6PM 90 tablet 2   carvedilol (COREG) 25 MG tablet Take 1 tablet (25 mg total) by mouth 2 (two) times daily with a meal. 180 tablet 3   diphenhydramine-acetaminophen (TYLENOL PM) 25-500 MG TABS tablet Take 1 tablet by mouth at bedtime as needed (sleep).     empagliflozin (JARDIANCE) 25 MG TABS tablet Take 1 tablet (25 mg total) by mouth daily before breakfast. 90 tablet 3   ezetimibe (ZETIA) 10 MG tablet Take 1 tablet by mouth once daily 90 tablet 2   furosemide (LASIX) 20 MG tablet Take 1 tablet (20 mg total) by  mouth as needed. Take 1 tablet daily as needed for swelling. 90 tablet 3   meclizine (ANTIVERT) 25 MG tablet Take 25 mg by mouth 3  (three) times daily as needed for dizziness.     nilotinib (TASIGNA) 50 MG capsule Take 2 capsules (100 mg total) by mouth every 12 (twelve) hours. Take on empty stomach, at least one hour before or two hours after food. (Patient taking differently: Take 50 mg by mouth every 12 (twelve) hours. Take on empty stomach, at least one hour before or two hours after food.) 360 capsule 0   nitroGLYCERIN (NITROSTAT) 0.4 MG SL tablet Place 0.4 mg under the tongue every 5 (five) minutes as needed for chest pain.     No current facility-administered medications for this visit.    SUMMARY OF ONCOLOGIC HISTORY: Oncology History  Chronic myelogenous leukemia (HCC)  05/04/2012 Bone Marrow Biopsy   BM confirmed diagnosis of CML in Chronic phase. BCR/ABL by PCR detected abnormalitis with b2a2 & b3a2 subtypes   05/09/2012 - 03/16/2015 Chemotherapy   He was started on treatment with Dasatinib 100 mg daily   06/06/2012 Adverse Reaction   Dose of medication was reduced to 50 mg daily due to pancytopenia   01/24/2013 Progression   Patient was noted to have elevated blood count which and was subsequently found to be noncompliant to treatment. The patient has not been on treatment for several months because his prescription ran out. He was restarted back on treatment   01/16/2014 Progression   Bcr/ABL by PCR is worse. Dose of Dasatinib was increased to 100 mg.   02/26/2014 Tumor Marker   Blood work for ABL kinase mutation was negative.   10/29/2014 Tumor Marker   BCR/ABL b2a2 & b3a2 0.29%, IS 0.1624%, not in MMR yet but improving   11/05/2014 Adverse Reaction   He had thoracentesis due to pleural effusion   11/27/2014 Surgery   He underwent right video-assisted thoracoscopy, Drainage of pleural effusion, Pleural biopsy, Diaphragm biopsy, Lung biopsy & Talc pleurodesis   01/30/2015 Tumor Marker   BCR/ABL b2a2 & b3a2 0.78%, IS 0.4368%, not in MMR    03/11/2015 Pathology Results   BCR/ABL b2a2 0.19%, IS 0.1064%, not  in MMR    03/16/2015 Adverse Reaction   Dasatinib was stopped due to recurrent pleural effusion   03/28/2015 - 11/06/2017 Chemotherapy   He started on Bosutinib   03/30/2015 Procedure   He has therapeutic thoracentesis of the right lung with 1 liter of fluid removed   03/30/2015 Adverse Reaction   Bosutinib is placed on hold, to be restart on 8/25 at 250 mg due to severe diarrhea   04/28/2015 Tumor Marker   BCR/ABL b2a2 0.06%, IS 0.0336%, in MMR    06/10/2015 Tumor Marker   BCR/ABL b2a2 0.14%, IS 0.1162%, not in MMR    08/14/2015 Pathology Results   BCR/ABL b2a2 0.02%, IS 0.0166%, In MMR    11/09/2015 Tumor Marker   BCR/ABL b2a2 0.005%, IS 0.0042%, In MMR    02/08/2016 Tumor Marker   BCR/ABL undetectable. In MMR   06/13/2016 Tumor Marker   BCR/ABL undetectable. In MMR   12/06/2016 Pathology Results   BCR/ABL undetectable. In MMR   06/08/2017 Pathology Results   BCR/ABL undetectable. In MMR   09/08/2017 Pathology Results   BCR/ABL undetectable. In MMR   12/11/2017 Pathology Results   BCR/ABL undetectable. In MMR   03/19/2018 Pathology Results   BCR/ABL undetectable. In MMR   06/12/2018 Pathology Results   BCR/ABL undetectable. In  MMR   09/06/2018 Pathology Results   BCR ABL is detectable. e13a2 (b2a2) and [id number]) IS: 0.009%   09/26/2018 -  Chemotherapy   The patient had Tasigna    11/21/2018 Pathology Results   BCR/ABL undetectable. In MMR   03/07/2019 Pathology Results   BCR/ABL undetectable. In MMR   06/17/2019 Pathology Results   BCR/ABL undetectable. In MMR   10/14/2019 Pathology Results   BCR ABL is detectable. e13a2 (b2a2) and U98J1(B1Y7) IS: 0.0281%. In MMR   01/14/2020 Pathology Results   BCR ABL is detectable. e13a2 (b2a2) and W29F6(O1H0) IS: 0.023%. In MMR   10/23/2020 Pathology Results   BCR ABL is detectable. e13a2 (b2a2) and Q65H8(I6N6) IS: 0.0643%. In MMR   01/22/2021 Pathology Results   BCR ABL is detectable. e13a2 (b2a2) and E95M8(U1L2) IS: 0.027%. In MMR    05/25/2021 Pathology Results   BCR ABL is detectable. e13a2 (b2a2) and G40N0(U7O5) IS: 0.0091%. In MMR   08/25/2021 Pathology Results   BCR ABL is detectable. e13a2 (b2a2) and D66Y4(I3K7) IS: 0.0039%. In MMR   12/13/2021 Pathology Results   BCR/ABL undetectable. In MMR   03/15/2022 Pathology Results   BCR/ABL undetectable. In MMR   05/16/2022 Pathology Results   BCR/ABL undetectable. In MMR     08/17/2022 Pathology Results   BCR/ABL undetectable. In MMR    11/14/2022 Pathology Results   BCR/ABL undetectable. In MMR      02/24/2023 Pathology Results   BCR/ABL undetectable. In MMR       05/29/2023 Pathology Results   BCR ABL is detectable. e13a2 (b2a2) IS: 0.0164%. In MMR   08/29/2023 Pathology Results   BCR/ABL undetectable. In MMR         PHYSICAL EXAMINATION: ECOG PERFORMANCE STATUS: 0 - Asymptomatic  Vitals:   09/08/23 0900  BP: (!) 147/82  Pulse: 85  Resp: 18  Temp: 97.7 F (36.5 C)  SpO2: 96%   Filed Weights   09/08/23 0900  Weight: 181 lb (82.1 kg)    GENERAL:alert, no distress and comfortable LABORATORY DATA:  I have reviewed the data as listed    Component Value Date/Time   NA 143 08/29/2023 0848   NA 142 11/02/2022 1403   NA 142 06/08/2017 0832   K 3.6 08/29/2023 0848   K 3.9 06/08/2017 0832   CL 108 08/29/2023 0848   CL 110 (H) 11/14/2012 0918   CO2 28 08/29/2023 0848   CO2 25 06/08/2017 0832   GLUCOSE 105 (H) 08/29/2023 0848   GLUCOSE 98 06/08/2017 0832   GLUCOSE 108 (H) 11/14/2012 0918   BUN 15 08/29/2023 0848   BUN 21 11/02/2022 1403   BUN 11.6 06/08/2017 0832   CREATININE 0.90 08/29/2023 0848   CREATININE 1.13 01/22/2021 0840   CREATININE 0.90 05/31/2019 0837   CREATININE 0.9 06/08/2017 0832   CALCIUM 8.4 (L) 08/29/2023 0848   CALCIUM 8.6 06/08/2017 0832   PROT 6.3 (L) 08/29/2023 0848   PROT 6.5 11/02/2022 1403   PROT 6.7 06/08/2017 0832   ALBUMIN 2.8 (L) 08/29/2023 0848   ALBUMIN 3.7 (L) 11/02/2022 1403   ALBUMIN 3.4 (L)  06/08/2017 0832   AST 16 08/29/2023 0848   AST 13 (L) 01/22/2021 0840   AST 13 06/08/2017 0832   ALT 15 08/29/2023 0848   ALT 14 01/22/2021 0840   ALT 7 06/08/2017 0832   ALKPHOS 73 08/29/2023 0848   ALKPHOS 96 06/08/2017 0832   BILITOT 0.7 08/29/2023 0848   BILITOT 0.6 11/02/2022 1403   BILITOT 0.3 01/22/2021 0840  BILITOT 0.44 06/08/2017 0832   GFRNONAA >60 08/29/2023 0848   GFRNONAA >60 01/22/2021 0840   GFRNONAA 83 05/31/2019 0837   GFRAA >60 04/24/2020 0842   GFRAA 96 05/31/2019 0837    No results found for: "SPEP", "UPEP"  Lab Results  Component Value Date   WBC 9.2 08/29/2023   NEUTROABS 7.7 08/29/2023   HGB 10.7 (L) 08/29/2023   HCT 32.4 (L) 08/29/2023   MCV 93.6 08/29/2023   PLT 242 08/29/2023      Chemistry      Component Value Date/Time   NA 143 08/29/2023 0848   NA 142 11/02/2022 1403   NA 142 06/08/2017 0832   K 3.6 08/29/2023 0848   K 3.9 06/08/2017 0832   CL 108 08/29/2023 0848   CL 110 (H) 11/14/2012 0918   CO2 28 08/29/2023 0848   CO2 25 06/08/2017 0832   BUN 15 08/29/2023 0848   BUN 21 11/02/2022 1403   BUN 11.6 06/08/2017 0832   CREATININE 0.90 08/29/2023 0848   CREATININE 1.13 01/22/2021 0840   CREATININE 0.90 05/31/2019 0837   CREATININE 0.9 06/08/2017 0832      Component Value Date/Time   CALCIUM 8.4 (L) 08/29/2023 0848   CALCIUM 8.6 06/08/2017 0832   ALKPHOS 73 08/29/2023 0848   ALKPHOS 96 06/08/2017 0832   AST 16 08/29/2023 0848   AST 13 (L) 01/22/2021 0840   AST 13 06/08/2017 0832   ALT 15 08/29/2023 0848   ALT 14 01/22/2021 0840   ALT 7 06/08/2017 0832   BILITOT 0.7 08/29/2023 0848   BILITOT 0.6 11/02/2022 1403   BILITOT 0.3 01/22/2021 0840   BILITOT 0.44 06/08/2017 0832       RADIOGRAPHIC STUDIES: I have personally reviewed the radiological images as listed and agreed with the findings in the report. DG Chest 2 View Result Date: 08/31/2023 CLINICAL DATA:  Cough and shortness of breath for 5 days. EXAM: CHEST - 2  VIEW COMPARISON:  10/26/2022.  CT, 12/06/2021. FINDINGS: Stable changes from prior CABG surgery. Cardiac silhouette is normal in size. No mediastinal or hilar masses or evidence of adenopathy. Stable left anterior chest wall AICD. Lungs are hyperexpanded. Trace pleural effusions. No evidence of pneumonia or pulmonary edema. No pneumothorax. Skeletal structures are demineralized, but intact. IMPRESSION: 1. No acute cardiopulmonary disease. Electronically Signed   By: Amie [place] M.D.   On: 08/31/2023 15:33   CUP PACEART REMOTE DEVICE CHECK Result Date: 08/28/2023 Scheduled remote reviewed. Normal device function.  HF diagnostics currently abnormal. 2 NSVT, longest 14 beats with brief termination followed by period of irregular R-R with ectopy, morphology c/w presenting EGM, analysis limited due to RV only EGM. Next remote 91 days. [place], CVRS

## 2023-09-08 NOTE — Assessment & Plan Note (Signed)
This is likely anemia of chronic disease. The patient denies recent history of bleeding such as epistaxis, hematuria or hematochezia. He is asymptomatic from the anemia. We will observe for now.  

## 2023-09-18 ENCOUNTER — Ambulatory Visit (INDEPENDENT_AMBULATORY_CARE_PROVIDER_SITE_OTHER): Payer: Medicare Other | Admitting: Family Medicine

## 2023-09-18 ENCOUNTER — Encounter: Payer: Self-pay | Admitting: Family Medicine

## 2023-09-18 VITALS — BP 142/86 | HR 83 | Ht 69.0 in | Wt 180.4 lb

## 2023-09-18 DIAGNOSIS — Z125 Encounter for screening for malignant neoplasm of prostate: Secondary | ICD-10-CM | POA: Diagnosis not present

## 2023-09-18 DIAGNOSIS — I5020 Unspecified systolic (congestive) heart failure: Secondary | ICD-10-CM | POA: Diagnosis not present

## 2023-09-18 DIAGNOSIS — I10 Essential (primary) hypertension: Secondary | ICD-10-CM

## 2023-09-18 DIAGNOSIS — Z0000 Encounter for general adult medical examination without abnormal findings: Secondary | ICD-10-CM

## 2023-09-18 DIAGNOSIS — Z8679 Personal history of other diseases of the circulatory system: Secondary | ICD-10-CM

## 2023-09-18 DIAGNOSIS — Z0001 Encounter for general adult medical examination with abnormal findings: Secondary | ICD-10-CM | POA: Diagnosis not present

## 2023-09-18 DIAGNOSIS — I4891 Unspecified atrial fibrillation: Secondary | ICD-10-CM

## 2023-09-18 DIAGNOSIS — I6521 Occlusion and stenosis of right carotid artery: Secondary | ICD-10-CM

## 2023-09-18 NOTE — Progress Notes (Signed)
 Subjective:    Patient ID: Anthony Wolfe, male    DOB: [birth year], 78 y.o.   MRN: [id number]  Patient is a very pleasant 78 year old Caucasian gentleman with a history of coronary artery disease status postmyocardial infarction, congestive heart failure with reduced ejection fraction, paroxysmal atrial fibrillation, history of bilateral carotid artery stenosis, and CML.  Patient last had carotid Dopplers in February 2024.  He is due again for this.  His last carotid Doppler showed a 50 to 69% stenosis in his right internal carotid artery.  Showed a complete occlusion of his left carotid artery.  He denies any chest pain or shortness of breath.  He is due the pneumonia vaccine as well as an RSV vaccine and flu but.  Refuses all vaccinations today.  Due to his age and his medical comorbidities, patient is not due for a colonoscopy.  He is due for prostate cancer screening.  Recently had lab work at his oncologist office that showed a drop in his hemoglobin from 12-10.7.  He denies any melena or hematochezia.  I would like to repeat that today.  He is also due for a fasting lipid panel.  Given his history of cardiovascular disease, his goal LDL cholesterol would be less than 55. 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 difficile colitis 06/16/2014   CAD (coronary artery disease)    a. s/p 3v CABG 2010. b. NSTEMI 05/2014 in setting of VT. Cath impressions: "Recent IMI with occluded SVG to RCA. Has Right to Right collaterals and collaterals from septal and OM. EF 40%."   Carotid disease, bilateral (HCC)    a. Right CEA 2002; known occluded left carotid. b. Duplex 06/2014: known occluded LICA, stable 1-39% RICA s/p CEA.   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 Prior EF 36%. b. 2014: 50%. c. 05/2014: EF 35-40% by echo, 40% with inf HK by cath.   NSTEMI (non-ST elevation myocardial infarction) (HCC) 05/08/2014   Pleural effusion 05/08/2014   Pneumonia 05/23/2014   Presence of permanent cardiac pacemaker    Prostate hypertrophy    PVC's (premature ventricular contractions)    a. PVC's with prior Holter showing PVC load of 22%.   Shortness of breath dyspnea    Stroke (HCC) 08/09/1999   TIA (transient ischemic attack)    ASPVD, S./P. right CEA, 2002, and known occluded left carotid   Ventricular tachycardia (HCC)    a. Admitted with VT 05/2013 - EPS with inducible sustained monomorphic VT; s/p Medtronic ICD 05/21/14.   Past Surgical History:  Procedure Laterality Date   A-FLUTTER ABLATION N/A 01/06/2023   Procedure: A-FLUTTER ABLATION;  Surgeon: Tammie Fall, MD;  Location: [facility] INVASIVE CV LAB;  Service: Cardiovascular;  Laterality: N/A;   BACK SURGERY     CARDIOVERSION N/A 01/04/2022   Procedure: CARDIOVERSION;  Surgeon: Euell Herrlich, MD;  Location: [facility] ENDOSCOPY;  Service: Cardiovascular;  Laterality: N/A;   CARDIOVERSION N/A 11/24/2022   Procedure: CARDIOVERSION;  Surgeon: Jerryl Morin, DO;  Location: [facility] INVASIVE CV LAB;  Service: Cardiovascular;  Laterality:  N/A;   CAROTID ENDARTERECTOMY Right 01   carotidectomy  2003   right side   COLONOSCOPY  07/2010   CORONARY ARTERY BYPASS GRAFT  02/2009   3 vessel   ELECTROPHYSIOLOGIC STUDY  05/21/14   ELECTROPHYSIOLOGY STUDY N/A 05/21/2014   Procedure: ELECTROPHYSIOLOGY STUDY;  Surgeon: Verona Goodwill, MD;  Location: [facility] CATH LAB;  Service: Cardiovascular;  Laterality: N/A;   EP IMPLANTABLE DEVICE  05/21/14   single chamber Metronic ICD   IR THORACENTESIS ASP PLEURAL SPACE W/IMG GUIDE  12/07/2021   LEFT HEART CATHETERIZATION WITH CORONARY/GRAFT ANGIOGRAM N/A  05/19/2014   Procedure: LEFT HEART CATHETERIZATION WITH Estella Helling;  Surgeon: Loyde Rule, MD;  Location: [facility] CATH LAB;  Service: Cardiovascular;  Laterality: N/A;   PLEURAL BIOPSY N/A 11/27/2014   Procedure: PLEURAL BIOPSY;  Surgeon: Zelphia Higashi, MD;  Location: [facility] OR;  Service: Thoracic;  Laterality: N/A;   PLEURAL EFFUSION DRAINAGE Right 11/27/2014   Procedure: DRAINAGE OF PLEURAL EFFUSION;  Surgeon: Zelphia Higashi, MD;  Location: [facility] OR;  Service: Thoracic;  Laterality: Right;   TALC  PLEURODESIS Right 11/27/2014   Procedure: TALC  PLEURADESIS;  Surgeon: Zelphia Higashi, MD;  Location: [facility] OR;  Service: Thoracic;  Laterality: Right;   VIDEO ASSISTED THORACOSCOPY Right 11/27/2014   Procedure: RIGHT VIDEO ASSISTED THORACOSCOPY;  Surgeon: Zelphia Higashi, MD;  Location: [facility] OR;  Service: Thoracic;  Laterality: Right;   Current Outpatient Medications on File Prior to Visit  Medication Sig Dispense Refill   aspirin  EC 81 MG tablet Take 1 tablet (81 mg total) by mouth daily. Swallow whole.     atorvastatin  (LIPITOR ) 80 MG tablet TAKE 1 TABLET BY MOUTH ONCE DAILY AT  6PM 90 tablet 2   carvedilol  (COREG ) 25 MG tablet Take 1 tablet (25 mg total) by mouth 2 (two) times daily with a meal. 180 tablet 3   diphenhydramine -acetaminophen  (TYLENOL  PM) 25-500 MG TABS tablet Take 1 tablet by mouth at bedtime as needed (sleep).     empagliflozin  (JARDIANCE ) 25 MG TABS tablet Take 1 tablet (25 mg total) by mouth daily before breakfast. 90 tablet 3   ezetimibe  (ZETIA ) 10 MG tablet Take 1 tablet by mouth once daily 90 tablet 2   meclizine  (ANTIVERT ) 25 MG tablet Take 25 mg by mouth 3 (three) times daily as needed for dizziness.     nilotinib  (TASIGNA ) 50 MG capsule Take 2 capsules (100 mg total) by mouth every 12 (twelve) hours. Take on empty stomach, at least one hour before or two hours after food. (Patient taking differently: Take 50 mg by mouth every 12 (twelve) hours. Take on  empty stomach, at least one hour before or two hours after food.) 360 capsule 0   nitroGLYCERIN  (NITROSTAT ) 0.4 MG SL tablet Place 0.4 mg under the tongue every 5 (five) minutes as needed for chest pain.     furosemide  (LASIX ) 20 MG tablet Take 1 tablet (20 mg total) by mouth as needed. Take 1 tablet daily as needed for swelling. 90 tablet 3   No current facility-administered medications on file prior to visit.   Allergies  Allergen Reactions   Breztri Aerosphere [Budeson-Glycopyrrol-Formoterol] Swelling    Swelling of mouth tongue    Norvasc [Amlodipine Besylate] Rash    rash   Sulfa Antibiotics Hives    Hives    Social History   Socioeconomic History   Marital status: Married    Spouse name: Not on file   Number of children: 2  Years of education: Not on file   Highest education level: Not on file  Occupational History    Comment: retired Administrator  Tobacco Use   Smoking status: Former    Current packs/day: 0.00    Average packs/day: 3.0 packs/day for 45.0 years (135.0 ttl pk-yrs)    Types: Cigarettes    Start date: 08/08/1952    Quit date: 08/08/1997    Years since quitting: 26.1    Passive exposure: Past   Smokeless tobacco: Former  Building services engineer status: Never Used  Substance and Sexual Activity   Alcohol use: No    Alcohol/week: 0.0 standard drinks of alcohol   Drug use: No   Sexual activity: Yes  Other Topics Concern   Not on file  Social History Narrative   Not on file   Social Drivers of Health   Financial Resource Strain: Low Risk  (11/17/2022)   Overall Financial Resource Strain (CARDIA)    Difficulty of Paying Living Expenses: Not hard at all  Food Insecurity: No Food Insecurity (11/17/2022)   Hunger Vital Sign    Worried About Running Out of Food in the Last Year: Never true    Ran Out of Food in the Last Year: Never true  Transportation Needs: No Transportation Needs (11/17/2022)   PRAPARE - Administrator, Civil Service (Medical):  No    Lack of Transportation (Non-Medical): No  Physical Activity: Insufficiently Active (11/11/2021)   Exercise Vital Sign    Days of Exercise per Week: 3 days    Minutes of Exercise per Session: 30 min  Stress: No Stress Concern Present (11/17/2022)   [employer] of Occupational Health - Occupational Stress Questionnaire    Feeling of Stress : Not at all  Social Connections: Socially Integrated (11/17/2022)   Social Connection and Isolation Panel [NHANES]    Frequency of Communication with Friends and Family: More than three times a week    Frequency of Social Gatherings with Friends and Family: More than three times a week    Attends Religious Services: More than 4 times per year    Active Member of [employer] or Organizations: Yes    Attends Banker Meetings: More than 4 times per year    Marital Status: Married  Catering manager Violence: Not At Risk (11/17/2022)   Humiliation, Afraid, Rape, and Kick questionnaire    Fear of Current or Ex-Partner: No    Emotionally Abused: No    Physically Abused: No    Sexually Abused: No      Review of Systems  Gastrointestinal:  Positive for diarrhea.  All other systems reviewed and are negative.      Objective:   Physical Exam Vitals reviewed.  Constitutional:      General: He is not in acute distress.    Appearance: Normal appearance. He is well-developed. He is not ill-appearing, toxic-appearing or diaphoretic.  HENT:     Head: Normocephalic and atraumatic.     Right Ear: Tympanic membrane and ear canal normal.     Left Ear: Tympanic membrane and ear canal normal.     Nose: Nose normal. No congestion or rhinorrhea.     Mouth/Throat:     Pharynx: No oropharyngeal exudate or posterior oropharyngeal erythema.  Eyes:     Extraocular Movements: Extraocular movements intact.     Pupils: Pupils are equal, round, and reactive to light.  Cardiovascular:     Rate and Rhythm: Normal rate and regular rhythm.  Heart  sounds: Normal heart sounds. No murmur heard. Pulmonary:     Effort: Pulmonary effort is normal. No tachypnea, accessory muscle usage, prolonged expiration or respiratory distress.     Breath sounds: Normal breath sounds. No stridor. No wheezing, rhonchi or rales.  Chest:     Chest wall: No tenderness.  Abdominal:     General: Bowel sounds are normal. There is no distension.     Palpations: Abdomen is soft. There is no mass.     Tenderness: There is no abdominal tenderness. There is no guarding or rebound.  Musculoskeletal:     Right lower leg: No edema.     Left lower leg: No edema.  Skin:    Findings: No erythema or rash.  Neurological:     General: No focal deficit present.     Mental Status: He is alert and oriented to person, place, and time.     Motor: No weakness.     Coordination: Coordination normal.     Gait: Gait normal.  Psychiatric:        Mood and Affect: Mood normal.        Behavior: Behavior normal.        Thought Content: Thought content normal.        Judgment: Judgment normal.           Assessment & Plan:  History of ASCVD - Plan: Lipid panel, CBC with Differential/Platelet  Benign essential HTN - Plan: Lipid panel, CBC with Differential/Platelet  Prostate cancer screening - Plan: PSA  Stenosis of right carotid artery - Plan: US  Carotid Duplex Bilateral  Atrial fibrillation, unspecified type (HCC)  Heart failure with reduced ejection fraction (HCC)  General medical exam Patient is in normal sinus rhythm this morning.  He is not taking any anticoagulation other than aspirin  AGAINST MEDICAL ADVICE.  His blood pressure is acceptable.  I will check a CBC as he recently showed a drop in his hemoglobin from 12-10.7.  His recent CMP at his oncologist office was normal.  I will check a fasting lipid panel to ensure that his LDL cholesterol is less than 55.  I will check a PSA to screen for prostate cancer.  He declines the pneumonia vaccine, flu shot, and RSV  vaccine.  I will schedule the patient for carotid Doppler to monitor the stenosis of the right carotid artery.

## 2023-09-19 LAB — CBC WITH DIFFERENTIAL/PLATELET
Absolute Lymphocytes: 1133 {cells}/uL (ref 850–3900)
Absolute Monocytes: 630 {cells}/uL (ref 200–950)
Basophils Absolute: 113 {cells}/uL (ref 0–200)
Basophils Relative: 1.5 %
Eosinophils Absolute: 98 {cells}/uL (ref 15–500)
Eosinophils Relative: 1.3 %
HCT: 34.8 % — ABNORMAL LOW (ref 38.5–50.0)
Hemoglobin: 11.2 g/dL — ABNORMAL LOW (ref 13.2–17.1)
MCH: 30.6 pg (ref 27.0–33.0)
MCHC: 32.2 g/dL (ref 32.0–36.0)
MCV: 95.1 fL (ref 80.0–100.0)
MPV: 10.8 fL (ref 7.5–12.5)
Monocytes Relative: 8.4 %
Neutro Abs: 5528 {cells}/uL (ref 1500–7800)
Neutrophils Relative %: 73.7 %
Platelets: 244 10*3/uL (ref 140–400)
RBC: 3.66 10*6/uL — ABNORMAL LOW (ref 4.20–5.80)
RDW: 14.5 % (ref 11.0–15.0)
Total Lymphocyte: 15.1 %
WBC: 7.5 10*3/uL (ref 3.8–10.8)

## 2023-09-19 LAB — LIPID PANEL
Cholesterol: 104 mg/dL (ref <200)
HDL: 38 mg/dL — ABNORMAL LOW (ref >=40)
LDL Cholesterol (Calc): 48 mg/dL
Non-HDL Cholesterol (Calc): 66 mg/dL (ref <130)
Total CHOL/HDL Ratio: 2.7 (calc) (ref <5.0)
Triglycerides: 94 mg/dL (ref <150)

## 2023-09-19 LAB — PSA: PSA: 2.23 ng/mL (ref <=4.00)

## 2023-09-25 ENCOUNTER — Ambulatory Visit
Admission: RE | Admit: 2023-09-25 | Discharge: 2023-09-25 | Disposition: A | Discharge status: Home / Self Care | Payer: Medicare Other | Source: Ambulatory Visit | Attending: Family Medicine | Admitting: Family Medicine

## 2023-09-25 DIAGNOSIS — I6521 Occlusion and stenosis of right carotid artery: Secondary | ICD-10-CM

## 2023-09-25 DIAGNOSIS — I6522 Occlusion and stenosis of left carotid artery: Secondary | ICD-10-CM | POA: Diagnosis not present

## 2023-10-04 ENCOUNTER — Encounter: Payer: Self-pay | Admitting: Internal Medicine

## 2023-10-05 NOTE — Progress Notes (Signed)
 Remote ICD transmission.

## 2023-10-10 NOTE — Telephone Encounter (Unsigned)
 Copied from CRM [id number]. Topic: Clinical - Medical Advice >> Oct 10, 2023 12:18 PM Gildardo Pounds wrote: Reason for CRM: Patient states that carotid artery is at 50%. Patient just received a call that there is no blockage. Callback number is [phone]

## 2023-10-20 ENCOUNTER — Ambulatory Visit: Admitting: Family Medicine

## 2023-10-20 ENCOUNTER — Encounter: Payer: Self-pay | Admitting: Family Medicine

## 2023-10-20 VITALS — BP 136/82 | HR 85 | Temp 97.7°F | Ht 69.0 in | Wt 184.0 lb

## 2023-10-20 DIAGNOSIS — M5431 Sciatica, right side: Secondary | ICD-10-CM | POA: Diagnosis not present

## 2023-10-20 MED ORDER — PREDNISONE 20 MG PO TABS: ORAL_TABLET | ORAL | 0 refills | Status: DC

## 2023-10-20 NOTE — Progress Notes (Signed)
 Subjective:    Patient ID: ANSH FAUBLE, male    DOB: [birth year], 78 y.o.   MRN: [id number]  Patient reports a 2-week history of pain in his posterior hips bilaterally.  The pain radiates from his right posterior hip down his anterior right thigh.  His right leg aches and throbs.  He has palpable dorsalis pedis pulses in both feet, he has palpable popliteal pulses in his right leg.  He has palpable femoral pulses in his right leg.  He has no pain with flexion internal or external rotation of the right hip.  He has no tenderness to palpation in the right thigh.  There is no pain over the greater trochanteric bursa.  The pain seems to be more neuropathic in nature.  He is complaining of low back pain. 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 difficile colitis 06/16/2014   CAD (coronary artery disease)    a. s/p 3v CABG 2010. b. NSTEMI 05/2014 in setting of VT. Cath impressions: "Recent IMI with occluded SVG to RCA. Has Right to Right collaterals and collaterals from septal and OM. EF 40%."   Carotid disease, bilateral (HCC)    a. Right CEA 2002; known occluded left carotid. b. Duplex 06/2014: known occluded LICA, stable 1-39% RICA s/p CEA.   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 Prior EF 36%. b. 2014: 50%. c. 05/2014: EF 35-40% by echo, 40% with inf HK by cath.   NSTEMI (non-ST elevation myocardial infarction) (HCC) 05/08/2014   Pleural effusion 05/08/2014   Pneumonia 05/23/2014   Presence of permanent cardiac pacemaker    Prostate hypertrophy    PVC's (premature ventricular contractions)    a. PVC's with prior  Holter showing PVC load of 22%.   Shortness of breath dyspnea    Stroke (HCC) 08/09/1999   TIA (transient ischemic attack)    ASPVD, S./P. right CEA, 2002, and known occluded left carotid   Ventricular tachycardia (HCC)    a. Admitted with VT 05/2013 - EPS with inducible sustained monomorphic VT; s/p Medtronic ICD 05/21/14.   Past Surgical History:  Procedure Laterality Date   A-FLUTTER ABLATION N/A 01/06/2023   Procedure: A-FLUTTER ABLATION;  Surgeon: [name], MD;  Location: [facility] INVASIVE CV LAB;  Service: Cardiovascular;  Laterality: N/A;   BACK SURGERY     CARDIOVERSION N/A 01/04/2022   Procedure: CARDIOVERSION;  Surgeon: Parke Poisson, MD;  Location: [facility] ENDOSCOPY;  Service: Cardiovascular;  Laterality: N/A;   CARDIOVERSION N/A 11/24/2022   Procedure: CARDIOVERSION;  Surgeon: Thomasene Ripple, DO;  Location: [facility] INVASIVE CV LAB;  Service: Cardiovascular;  Laterality: N/A;   CAROTID ENDARTERECTOMY Right 01   carotidectomy  2003   right side   COLONOSCOPY  07/2010   CORONARY ARTERY BYPASS GRAFT  02/2009   3 vessel   ELECTROPHYSIOLOGIC STUDY  05/21/14   ELECTROPHYSIOLOGY STUDY N/A 05/21/2014   Procedure: ELECTROPHYSIOLOGY STUDY;  Surgeon: Duke Salvia, MD;  Location: [facility] CATH LAB;  Service: Cardiovascular;  Laterality: N/A;   EP IMPLANTABLE DEVICE  05/21/14   single  chamber Metronic ICD   IR THORACENTESIS ASP PLEURAL SPACE W/IMG GUIDE  12/07/2021   LEFT HEART CATHETERIZATION WITH CORONARY/GRAFT ANGIOGRAM N/A 05/19/2014   Procedure: LEFT HEART CATHETERIZATION WITH Isabel Caprice;  Surgeon: Wendall Stade, MD;  Location[facility] CATH LAB;  Service: Cardiovascular;  Laterality: N/A;   PLEURAL BIOPSY N/A 11/27/2014   Procedure: PLEURAL BIOPSY;  Surgeon: [name], MD;  Location: [facility] OR;  Service: Thoracic;  Laterality: N/A;   PLEURAL EFFUSION DRAINAGE Right 11/27/2014   Procedure: DRAINAGE OF PLEURAL EFFUSION;  Surgeon: [name], MD;  Location: [facility] OR;  Service:  Thoracic;  Laterality: Right;   TALC PLEURODESIS Right 11/27/2014   Procedure: Lurlean Nanny;  Surgeon: [name], MD;  Location: [facility] OR;  Service: Thoracic;  Laterality: Right;   VIDEO ASSISTED THORACOSCOPY Right 11/27/2014   Procedure: RIGHT VIDEO ASSISTED THORACOSCOPY;  Surgeon: [name], MD;  Location: [facility] OR;  Service: Thoracic;  Laterality: Right;   Current Outpatient Medications on File Prior to Visit  Medication Sig Dispense Refill   aspirin EC 81 MG tablet Take 1 tablet (81 mg total) by mouth daily. Swallow whole.     atorvastatin (LIPITOR) 80 MG tablet TAKE 1 TABLET BY MOUTH ONCE DAILY AT  6PM 90 tablet 2   carvedilol (COREG) 25 MG tablet Take 1 tablet (25 mg total) by mouth 2 (two) times daily with a meal. 180 tablet 3   diphenhydramine-acetaminophen (TYLENOL PM) 25-500 MG TABS tablet Take 1 tablet by mouth at bedtime as needed (sleep).     empagliflozin (JARDIANCE) 25 MG TABS tablet Take 1 tablet (25 mg total) by mouth daily before breakfast. 90 tablet 3   ezetimibe (ZETIA) 10 MG tablet Take 1 tablet by mouth once daily 90 tablet 2   meclizine (ANTIVERT) 25 MG tablet Take 25 mg by mouth 3 (three) times daily as needed for dizziness.     nilotinib (TASIGNA) 50 MG capsule Take 2 capsules (100 mg total) by mouth every 12 (twelve) hours. Take on empty stomach, at least one hour before or two hours after food. (Patient taking differently: Take 50 mg by mouth every 12 (twelve) hours. Take on empty stomach, at least one hour before or two hours after food.) 360 capsule 0   nitroGLYCERIN (NITROSTAT) 0.4 MG SL tablet Place 0.4 mg under the tongue every 5 (five) minutes as needed for chest pain.     furosemide (LASIX) 20 MG tablet Take 1 tablet (20 mg total) by mouth as needed. Take 1 tablet daily as needed for swelling. 90 tablet 3   No current facility-administered medications on file prior to visit.   Allergies  Allergen Reactions   Breztri Aerosphere  [Budeson-Glycopyrrol-Formoterol] Swelling    Swelling of mouth tongue    Norvasc [Amlodipine Besylate] Rash    rash   Sulfa Antibiotics Hives    Hives    Social History   Socioeconomic History   Marital status: Married    Spouse name: Not on file   Number of children: 2   Years of education: Not on file   Highest education level: Not on file  Occupational History    Comment: retired Administrator  Tobacco Use   Smoking status: Former    Current packs/day: 0.00    Average packs/day: 3.0 packs/day for 45.0 years (135.0 ttl pk-yrs)    Types: Cigarettes    Start date: 08/08/1952    Quit date: 08/08/1997    Years since quitting: 26.2  Passive exposure: Past   Smokeless tobacco: Former  Building services engineer status: Never Used  Substance and Sexual Activity   Alcohol use: No    Alcohol/week: 0.0 standard drinks of alcohol   Drug use: No   Sexual activity: Yes  Other Topics Concern   Not on file  Social History Narrative   Not on file   Social Drivers of Health   Financial Resource Strain: Low Risk  (11/17/2022)   Overall Financial Resource Strain (CARDIA)    Difficulty of Paying Living Expenses: Not hard at all  Food Insecurity: No Food Insecurity (11/17/2022)   Hunger Vital Sign    Worried About Running Out of Food in the Last Year: Never true    Ran Out of Food in the Last Year: Never true  Transportation Needs: No Transportation Needs (11/17/2022)   PRAPARE - Administrator, Civil Service (Medical): No    Lack of Transportation (Non-Medical): No  Physical Activity: Insufficiently Active (11/11/2021)   Exercise Vital Sign    Days of Exercise per Week: 3 days    Minutes of Exercise per Session: 30 min  Stress: No Stress Concern Present (11/17/2022)   [employer] of Occupational Health - Occupational Stress Questionnaire    Feeling of Stress : Not at all  Social Connections: Socially Integrated (11/17/2022)   Social Connection and Isolation Panel [NHANES]     Frequency of Communication with Friends and Family: More than three times a week    Frequency of Social Gatherings with Friends and Family: More than three times a week    Attends Religious Services: More than 4 times per year    Active Member of [employer] or Organizations: Yes    Attends Banker Meetings: More than 4 times per year    Marital Status: Married  Catering manager Violence: Not At Risk (11/17/2022)   Humiliation, Afraid, Rape, and Kick questionnaire    Fear of Current or Ex-Partner: No    Emotionally Abused: No    Physically Abused: No    Sexually Abused: No      Review of Systems  Gastrointestinal:  Positive for diarrhea.  All other systems reviewed and are negative.      Objective:   Physical Exam Vitals reviewed.  Constitutional:      General: He is not in acute distress.    Appearance: Normal appearance. He is well-developed. He is not ill-appearing, toxic-appearing or diaphoretic.  Cardiovascular:     Rate and Rhythm: Normal rate and regular rhythm.     Heart sounds: Normal heart sounds. No murmur heard. Pulmonary:     Effort: Pulmonary effort is normal. No tachypnea, accessory muscle usage, prolonged expiration or respiratory distress.     Breath sounds: No wheezing.  Abdominal:     General: Bowel sounds are normal. There is no distension.     Palpations: Abdomen is soft. There is no mass.     Tenderness: There is no abdominal tenderness. There is no guarding or rebound.  Musculoskeletal:     Lumbar back: Decreased range of motion.     Right hip: No deformity, tenderness, bony tenderness or crepitus. Normal range of motion. Normal strength.     Right lower leg: No edema.     Left lower leg: No edema.       Legs:  Feet:     Right foot:     Skin integrity: Skin integrity normal. No ulcer, blister, skin breakdown, erythema or warmth.  Neurological:     Mental Status: He is alert.           Assessment & Plan:  Right sided  sciatica Begin a prednisone taper pack for what I suspect may be right-sided lumbar radiculopathy.  Obtain x-rays of the lumbar spine and right hip if not improving.  Exam today however appears benign with regards to his right hip.  I believe the pain in his right thigh is referred pain

## 2023-10-27 ENCOUNTER — Other Ambulatory Visit: Payer: Self-pay | Admitting: Family Medicine

## 2023-10-27 NOTE — Telephone Encounter (Signed)
 Requested Prescriptions  Pending Prescriptions Disp Refills   atorvastatin (LIPITOR) 80 MG tablet [Pharmacy Med Name: Atorvastatin Calcium 80 MG Oral Tablet] 90 tablet 0    Sig: TAKE 1 TABLET BY MOUTH ONCE DAILY AT  6PM     Cardiovascular:  Antilipid - Statins Failed - 10/27/2023  5:25 PM      Failed - Valid encounter within last 12 months    Recent Outpatient Visits           1 year ago Dyspnea, unspecified type   [facility] [name], MD   2 years ago Bilateral carotid artery stenosis   [facility] Pickard, Priscille Heidelberg, MD   2 years ago COVID   [facility] Tanya Nones, Priscille Heidelberg, MD   2 years ago SOB (shortness of breath)   [facility] Pickard, Priscille Heidelberg, MD   2 years ago History of ASCVD   [facility] [name], MD              Failed - Lipid Panel in normal range within the last 12 months    Cholesterol, Total  Date Value Ref Range Status  11/02/2022 84 (L) 100 - 199 mg/dL Final   Cholesterol  Date Value Ref Range Status  09/18/2023 104 <200 mg/dL Final   LDL Cholesterol (Calc)  Date Value Ref Range Status  09/18/2023 48 mg/dL (calc) Final    Comment:    Reference range: <100 . Desirable range <100 mg/dL for primary prevention;   <70 mg/dL for patients with CHD or diabetic patients  with > or = 2 CHD risk factors. Marland Kitchen LDL-C is now calculated using the Martin-Hopkins  calculation, which is a validated novel method providing  better accuracy than the Friedewald equation in the  estimation of LDL-C.  Horald Pollen et al. Lenox Ahr. 4098;119(14): 2061-2068  (http://education.[facility].com/faq/FAQ164)    HDL  Date Value Ref Range Status  09/18/2023 38 (L) > OR = 40 mg/dL Final  78/29/5621 34 (L) >39 mg/dL Final   Triglycerides  Date Value Ref Range Status  09/18/2023 94 <150 mg/dL Final         Passed - Patient is not pregnant

## 2023-10-30 ENCOUNTER — Other Ambulatory Visit: Payer: Self-pay | Admitting: Internal Medicine

## 2023-11-03 ENCOUNTER — Ambulatory Visit (INDEPENDENT_AMBULATORY_CARE_PROVIDER_SITE_OTHER): Admitting: Family Medicine

## 2023-11-03 ENCOUNTER — Encounter: Payer: Self-pay | Admitting: Family Medicine

## 2023-11-03 VITALS — BP 118/56 | HR 87 | Temp 98.5°F | Ht 69.0 in | Wt 181.0 lb

## 2023-11-03 DIAGNOSIS — R0602 Shortness of breath: Secondary | ICD-10-CM

## 2023-11-03 DIAGNOSIS — J441 Chronic obstructive pulmonary disease with (acute) exacerbation: Secondary | ICD-10-CM

## 2023-11-03 LAB — INFLUENZA A AND B AG, IMMUNOASSAY
INFLUENZA A ANTIGEN: NOT DETECTED
INFLUENZA B ANTIGEN: NOT DETECTED

## 2023-11-03 MED ORDER — IPRATROPIUM-ALBUTEROL 0.5-2.5 (3) MG/3ML IN SOLN
3.0000 mL | Freq: Once | RESPIRATORY_TRACT | Status: AC
  Administered 2023-11-03: 3 mL via RESPIRATORY_TRACT

## 2023-11-03 MED ORDER — METHYLPREDNISOLONE ACETATE 80 MG/ML IJ SUSP
80.0000 mg | Freq: Once | INTRAMUSCULAR | Status: AC
  Administered 2023-11-03: 80 mg via INTRAMUSCULAR

## 2023-11-03 MED ORDER — PREDNISONE 20 MG PO TABS: 60.0000 mg | ORAL_TABLET | Freq: Every day | ORAL | 0 refills | Status: DC

## 2023-11-03 NOTE — Progress Notes (Signed)
 Subjective:    Patient ID: Anthony Wolfe, male    DOB: [birth year], 78 y.o.   MRN: [id number]  Patient is a very pleasant 78 year old Caucasian gentleman with a history of coronary artery disease status post myocardial infarction, congestive heart failure with reduced ejection fraction, paroxysmal atrial fibrillation, history of bilateral carotid artery stenosis, COPD, and CML.   Patient states that he has been sick for over a week.  He is now short of breath.  He has a cough productive of yellow and green sputum.  He has diminished breath sounds bilaterally with expiratory wheezing in all 4 lung fields.  He denies any fever or chills or chest pain.  It does hurt to take a deep breath.  However he denies any angina. 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 difficile colitis 06/16/2014   CAD (coronary artery disease)    a. s/p 3v CABG 2010. b. NSTEMI 05/2014 in setting of VT. Cath impressions: "Recent IMI with occluded SVG to RCA. Has Right to Right collaterals and collaterals from septal and OM. EF 40%."   Carotid disease, bilateral (HCC)    a. Right CEA 2002; known occluded left carotid. b. Duplex 06/2014: known occluded LICA, stable 1-39% RICA s/p CEA.   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 Prior EF 36%. b. 2014: 50%. c. 05/2014: EF 35-40% by echo, 40% with inf HK by cath.   NSTEMI (non-ST elevation myocardial infarction) (HCC) 05/08/2014   Pleural effusion 05/08/2014   Pneumonia 05/23/2014   Presence of permanent cardiac pacemaker    Prostate hypertrophy    PVC's (premature ventricular contractions)    a. PVC's  with prior Holter showing PVC load of 22%.   Shortness of breath dyspnea    Stroke (HCC) 08/09/1999   TIA (transient ischemic attack)    ASPVD, S./P. right CEA, 2002, and known occluded left carotid   Ventricular tachycardia (HCC)    a. Admitted with VT 05/2013 - EPS with inducible sustained monomorphic VT; s/p Medtronic ICD 05/21/14.   Past Surgical History:  Procedure Laterality Date   A-FLUTTER ABLATION N/A 01/06/2023   Procedure: A-FLUTTER ABLATION;  Surgeon: [name], MD;  Location: [facility] INVASIVE CV LAB;  Service: Cardiovascular;  Laterality: N/A;   BACK SURGERY     CARDIOVERSION N/A 01/04/2022   Procedure: CARDIOVERSION;  Surgeon: Parke Poisson, MD;  Location: [facility] ENDOSCOPY;  Service: Cardiovascular;  Laterality: N/A;   CARDIOVERSION N/A 11/24/2022   Procedure: CARDIOVERSION;  Surgeon: Thomasene Ripple, DO;  Location: [facility] INVASIVE CV LAB;  Service: Cardiovascular;  Laterality: N/A;   CAROTID ENDARTERECTOMY Right 01   carotidectomy  2003   right side   COLONOSCOPY  07/2010   CORONARY ARTERY BYPASS GRAFT  02/2009   3 vessel   ELECTROPHYSIOLOGIC STUDY  05/21/14   ELECTROPHYSIOLOGY STUDY N/A 05/21/2014   Procedure: ELECTROPHYSIOLOGY STUDY;  Surgeon: Duke Salvia, MD;  Location: [facility] CATH LAB;  Service: Cardiovascular;  Laterality: N/A;   EP IMPLANTABLE DEVICE  05/21/14   single chamber Metronic ICD   IR THORACENTESIS  ASP PLEURAL SPACE W/IMG GUIDE  12/07/2021   LEFT HEART CATHETERIZATION WITH CORONARY/GRAFT ANGIOGRAM N/A 05/19/2014   Procedure: LEFT HEART CATHETERIZATION WITH Isabel Caprice;  Surgeon: Wendall Stade, MD;  Location: [facility] CATH LAB;  Service: Cardiovascular;  Laterality: N/A;   PLEURAL BIOPSY N/A 11/27/2014   Procedure: PLEURAL BIOPSY;  Surgeon: [name], MD;  Location: [facility] OR;  Service: Thoracic;  Laterality: N/A;   PLEURAL EFFUSION DRAINAGE Right 11/27/2014   Procedure: DRAINAGE OF PLEURAL EFFUSION;  Surgeon: [name], MD;  Location: [facility]  OR;  Service: Thoracic;  Laterality: Right;   TALC PLEURODESIS Right 11/27/2014   Procedure: Lurlean Nanny;  Surgeon: [name], MD;  Location: [facility] OR;  Service: Thoracic;  Laterality: Right;   VIDEO ASSISTED THORACOSCOPY Right 11/27/2014   Procedure: RIGHT VIDEO ASSISTED THORACOSCOPY;  Surgeon: [name], MD;  Location: [facility] OR;  Service: Thoracic;  Laterality: Right;   Current Outpatient Medications on File Prior to Visit  Medication Sig Dispense Refill   aspirin EC 81 MG tablet Take 1 tablet (81 mg total) by mouth daily. Swallow whole.     atorvastatin (LIPITOR) 80 MG tablet TAKE 1 TABLET BY MOUTH ONCE DAILY AT  6PM 90 tablet 3   carvedilol (COREG) 25 MG tablet Take 1 tablet (25 mg total) by mouth 2 (two) times daily with a meal. 180 tablet 3   diphenhydramine-acetaminophen (TYLENOL PM) 25-500 MG TABS tablet Take 1 tablet by mouth at bedtime as needed (sleep).     empagliflozin (JARDIANCE) 25 MG TABS tablet Take 1 tablet (25 mg total) by mouth daily before breakfast. 90 tablet 3   ezetimibe (ZETIA) 10 MG tablet Take 1 tablet by mouth once daily 90 tablet 3   furosemide (LASIX) 20 MG tablet Take 1 tablet (20 mg total) by mouth as needed. Take 1 tablet daily as needed for swelling. 90 tablet 3   meclizine (ANTIVERT) 25 MG tablet Take 25 mg by mouth 3 (three) times daily as needed for dizziness.     nilotinib (TASIGNA) 50 MG capsule Take 2 capsules (100 mg total) by mouth every 12 (twelve) hours. Take on empty stomach, at least one hour before or two hours after food. (Patient taking differently: Take 50 mg by mouth every 12 (twelve) hours. Take on empty stomach, at least one hour before or two hours after food.) 360 capsule 0   nitroGLYCERIN (NITROSTAT) 0.4 MG SL tablet Place 0.4 mg under the tongue every 5 (five) minutes as needed for chest pain.     predniSONE (DELTASONE) 20 MG tablet 3 tabs poqday 1-2, 2 tabs poqday 3-4, 1 tab poqday 5-6 12 tablet 0   No current  facility-administered medications on file prior to visit.   Allergies  Allergen Reactions   Breztri Aerosphere [Budeson-Glycopyrrol-Formoterol] Swelling    Swelling of mouth tongue    Norvasc [Amlodipine Besylate] Rash    rash   Sulfa Antibiotics Hives    Hives    Social History   Socioeconomic History   Marital status: Married    Spouse name: Not on file   Number of children: 2   Years of education: Not on file   Highest education level: Not on file  Occupational History    Comment: retired Administrator  Tobacco Use   Smoking status: Former    Current packs/day: 0.00    Average packs/day: 3.0 packs/day for 45.0 years (135.0 ttl pk-yrs)    Types: Cigarettes    Start date: 08/08/1952  Quit date: 08/08/1997    Years since quitting: 26.2    Passive exposure: Past   Smokeless tobacco: Former  Building services engineer status: Never Used  Substance and Sexual Activity   Alcohol use: No    Alcohol/week: 0.0 standard drinks of alcohol   Drug use: No   Sexual activity: Yes  Other Topics Concern   Not on file  Social History Narrative   Not on file   Social Drivers of Health   Financial Resource Strain: Low Risk  (11/17/2022)   Overall Financial Resource Strain (CARDIA)    Difficulty of Paying Living Expenses: Not hard at all  Food Insecurity: No Food Insecurity (11/17/2022)   Hunger Vital Sign    Worried About Running Out of Food in the Last Year: Never true    Ran Out of Food in the Last Year: Never true  Transportation Needs: No Transportation Needs (11/17/2022)   PRAPARE - Administrator, Civil Service (Medical): No    Lack of Transportation (Non-Medical): No  Physical Activity: Insufficiently Active (11/11/2021)   Exercise Vital Sign    Days of Exercise per Week: 3 days    Minutes of Exercise per Session: 30 min  Stress: No Stress Concern Present (11/17/2022)   [employer] of Occupational Health - Occupational Stress Questionnaire    Feeling of Stress : Not  at all  Social Connections: Socially Integrated (11/17/2022)   Social Connection and Isolation Panel [NHANES]    Frequency of Communication with Friends and Family: More than three times a week    Frequency of Social Gatherings with Friends and Family: More than three times a week    Attends Religious Services: More than 4 times per year    Active Member of [employer] or Organizations: Yes    Attends Banker Meetings: More than 4 times per year    Marital Status: Married  Catering manager Violence: Not At Risk (11/17/2022)   Humiliation, Afraid, Rape, and Kick questionnaire    Fear of Current or Ex-Partner: No    Emotionally Abused: No    Physically Abused: No    Sexually Abused: No      Review of Systems  Gastrointestinal:  Positive for diarrhea.  All other systems reviewed and are negative.      Objective:   Physical Exam Vitals reviewed.  Constitutional:      General: He is not in acute distress.    Appearance: Normal appearance. He is well-developed. He is not ill-appearing, toxic-appearing or diaphoretic.  Cardiovascular:     Rate and Rhythm: Normal rate and regular rhythm.     Heart sounds: Normal heart sounds. No murmur heard. Pulmonary:     Effort: Pulmonary effort is normal. No tachypnea, accessory muscle usage, prolonged expiration or respiratory distress.     Breath sounds: Decreased air movement present. Decreased breath sounds and wheezing present. No rhonchi.  Abdominal:     General: Bowel sounds are normal. There is no distension.     Palpations: Abdomen is soft. There is no mass.     Tenderness: There is no abdominal tenderness. There is no guarding or rebound.  Musculoskeletal:     Right lower leg: No edema.     Left lower leg: No edema.  Neurological:     Mental Status: He is alert.     EKG shows normal sinus rhythm with a left axis deviation and a right bundle branch block.  Patient has Q waves in the  inferior leads.  However these findings  are chronic and were present on his EKG from July 2024.  There are no acute changes.      Assessment & Plan:  Shortness of breath - Plan: Influenza A and B Ag, Immunoassay, ipratropium-albuterol (DUONEB) 0.5-2.5 (3) MG/3ML nebulizer solution 3 mL, methylPREDNISolone acetate (DEPO-MEDROL) injection 80 mg  COPD exacerbation (HCC) I believe the patient is having a COPD exacerbation.  He is not taking [employer].  He states he only uses it as needed.  He questions why this keeps happening.  Patient was given 80 mg of Depo-Medrol x 1 now.  He was also given a DuoNeb.  This was administered at 4 10 in the afternoon. Patient states that his breathing is starting to improve at 440 in the afternoon.  EKG shows no acute changes compared to July 2024.  I believe the patient is having a COPD exacerbation.  Begin prednisone taper pack starting tomorrow.  Use albuterol 2 puffs every 4-6 hours as needed.  Emphasized the importance of him using his Markus Daft consistently 2 puffs twice a day.  If he develops any chest pain or worsening shortness of breath I recommended that he go to the emergency room for further evaluation

## 2023-11-27 ENCOUNTER — Ambulatory Visit (INDEPENDENT_AMBULATORY_CARE_PROVIDER_SITE_OTHER): Payer: Medicare HMO

## 2023-11-27 DIAGNOSIS — I255 Ischemic cardiomyopathy: Secondary | ICD-10-CM

## 2023-11-27 DIAGNOSIS — I472 Ventricular tachycardia, unspecified: Secondary | ICD-10-CM

## 2023-11-28 ENCOUNTER — Inpatient Hospital Stay: Payer: Medicare Other | Attending: Hematology and Oncology

## 2023-11-28 DIAGNOSIS — C9210 Chronic myeloid leukemia, BCR/ABL-positive, not having achieved remission: Secondary | ICD-10-CM | POA: Insufficient documentation

## 2023-11-28 DIAGNOSIS — D6481 Anemia due to antineoplastic chemotherapy: Secondary | ICD-10-CM | POA: Insufficient documentation

## 2023-11-28 LAB — COMPREHENSIVE METABOLIC PANEL WITH GFR
ALT: 10 U/L (ref 0–44)
AST: 9 U/L — ABNORMAL LOW (ref 15–41)
Albumin: 3.3 g/dL — ABNORMAL LOW (ref 3.5–5.0)
Alkaline Phosphatase: 62 U/L (ref 38–126)
Anion gap: 4 — ABNORMAL LOW (ref 5–15)
BUN: 13 mg/dL (ref 8–23)
CO2: 29 mmol/L (ref 22–32)
Calcium: 8.6 mg/dL — ABNORMAL LOW (ref 8.9–10.3)
Chloride: 110 mmol/L (ref 98–111)
Creatinine, Ser: 0.72 mg/dL (ref 0.61–1.24)
GFR, Estimated: >60 mL/min (ref >60)
Glucose, Bld: 105 mg/dL — ABNORMAL HIGH (ref 70–99)
Potassium: 4.4 mmol/L (ref 3.5–5.1)
Sodium: 143 mmol/L (ref 135–145)
Total Bilirubin: 0.6 mg/dL (ref 0.0–1.2)
Total Protein: 6.1 g/dL — ABNORMAL LOW (ref 6.5–8.1)

## 2023-11-28 LAB — CBC WITH DIFFERENTIAL/PLATELET
Abs Immature Granulocytes: 0.04 10*3/uL (ref 0.00–0.07)
Basophils Absolute: 0.1 10*3/uL (ref 0.0–0.1)
Basophils Relative: 1 %
Eosinophils Absolute: 0.1 10*3/uL (ref 0.0–0.5)
Eosinophils Relative: 2 %
HCT: 33.3 % — ABNORMAL LOW (ref 39.0–52.0)
Hemoglobin: 10.9 g/dL — ABNORMAL LOW (ref 13.0–17.0)
Immature Granulocytes: 1 %
Lymphocytes Relative: 12 %
Lymphs Abs: 0.9 10*3/uL (ref 0.7–4.0)
MCH: 30.8 pg (ref 26.0–34.0)
MCHC: 32.7 g/dL (ref 30.0–36.0)
MCV: 94.1 fL (ref 80.0–100.0)
Monocytes Absolute: 0.6 10*3/uL (ref 0.1–1.0)
Monocytes Relative: 9 %
Neutro Abs: 5.3 10*3/uL (ref 1.7–7.7)
Neutrophils Relative %: 75 %
Platelets: 184 10*3/uL (ref 150–400)
RBC: 3.54 MIL/uL — ABNORMAL LOW (ref 4.22–5.81)
RDW: 15.5 % (ref 11.5–15.5)
WBC: 7 10*3/uL (ref 4.0–10.5)
nRBC: 0 % (ref 0.0–0.2)

## 2023-11-28 LAB — CUP PACEART REMOTE DEVICE CHECK
Battery Remaining Longevity: 16 mo
Battery Voltage: 2.94 V
Brady Statistic RV Percent Paced: 0.06 %
Date Time Interrogation Session: 20250421033323
HighPow Impedance: 47 Ohm
Implantable Lead Connection Status: 753985
Implantable Lead Implant Date: 20151014
Implantable Lead Location: 753860
Implantable Pulse Generator Implant Date: 20151014
Lead Channel Impedance Value: 399 Ohm
Lead Channel Impedance Value: 418 Ohm
Lead Channel Pacing Threshold Amplitude: 0.625 V
Lead Channel Pacing Threshold Pulse Width: 0.4 ms
Lead Channel Sensing Intrinsic Amplitude: 3.375 mV
Lead Channel Sensing Intrinsic Amplitude: 3.375 mV
Lead Channel Setting Pacing Amplitude: 2 V
Lead Channel Setting Pacing Pulse Width: 0.4 ms
Lead Channel Setting Sensing Sensitivity: 0.3 mV
Zone Setting Status: 755011

## 2023-12-03 LAB — BCR-ABL1, CML/ALL, PCR, QUANT
E1A2 Transcript: <0.0032 %
Interpretation (BCRAL):: NEGATIVE
b2a2 transcript: <0.0032 %
b3a2 transcript: <0.0032 %

## 2023-12-05 ENCOUNTER — Telehealth: Payer: Self-pay

## 2023-12-05 NOTE — Telephone Encounter (Signed)
-----   Message from [facility] sent at 12/04/2023  4:39 PM EDT ----- Remote Defibrillator interrogation reviewed. Presenting Rhythm:V-sensed. Battery and lead parameters stable with stable capture and sensing. Device programming is appropriate. No arrhythmias noted. Continue remote monitoring. Optivol elevated. Take lasix  20 mg daily for 3 days. Low salt diet.

## 2023-12-05 NOTE — Telephone Encounter (Signed)
 Spoke with Anthony Wolfe and advised of Dr Lawana Pray recommendation to take Furosemide  20mg  - daily x 3 days then return to prn dosing.  Encouraged low Na+ diet.  Anthony Wolfe is scheduled to see Mertha Abrahams, PA-C on 12/11/2023.  Anthony Wolfe verbalizes understanding and agrees with current plan.

## 2023-12-07 ENCOUNTER — Inpatient Hospital Stay: Payer: Medicare Other | Attending: Hematology and Oncology | Admitting: Hematology and Oncology

## 2023-12-07 ENCOUNTER — Encounter: Payer: Self-pay | Admitting: Hematology and Oncology

## 2023-12-07 VITALS — BP 147/88 | HR 78 | Temp 97.6°F | Resp 18 | Ht 69.0 in | Wt 179.6 lb

## 2023-12-07 DIAGNOSIS — C9210 Chronic myeloid leukemia, BCR/ABL-positive, not having achieved remission: Secondary | ICD-10-CM | POA: Insufficient documentation

## 2023-12-07 DIAGNOSIS — T451X5A Adverse effect of antineoplastic and immunosuppressive drugs, initial encounter: Secondary | ICD-10-CM | POA: Diagnosis not present

## 2023-12-07 DIAGNOSIS — D6481 Anemia due to antineoplastic chemotherapy: Secondary | ICD-10-CM | POA: Insufficient documentation

## 2023-12-07 NOTE — Assessment & Plan Note (Addendum)
This is likely anemia of chronic disease. The patient denies recent history of bleeding such as epistaxis, hematuria or hematochezia. He is asymptomatic from the anemia. We will observe for now.  

## 2023-12-07 NOTE — Progress Notes (Signed)
 [facility] OFFICE PROGRESS NOTE  Patient Care Team: Austine Lefort, MD as PCP - General (Family Medicine) Kyra Phy, MD as PCP - Cardiology (Cardiology) Tammie Fall, MD as PCP - Electrophysiology (Cardiology) Tex Filbert., MD as Attending Physician (Cardiology) Austine Lefort, MD (Family Medicine) Zelphia Higashi, MD (Cardiothoracic Surgery) Early, Sherre Docker, MD (Inactive) as Attending Physician (Vascular Surgery) Almeda Jacobs, MD as Consulting Physician (Hematology and Oncology) Verona Goodwill, MD as Consulting Physician (Cardiology) Myrle Aspen, [facility] (Inactive) as Pharmacist (Pharmacist) Nolon Baxter, Fredricka Jenny, [facility] (Inactive) as Pharmacist (Pharmacist) [facility]  Assessment & Plan Chronic myelogenous leukemia (HCC) I have reviewed test results with the patient and his wife He is compliant taking his medication as directed His recent molecular test showed undetectable BCR/ABL; he is still in major molecular response For now, we will continue close monitoring and follow-up in 3 months 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 The patient denies recent history of bleeding such as epistaxis, hematuria or hematochezia. He is asymptomatic from the anemia. We will observe for now.  No orders of the defined types were placed in this encounter.    Almeda Jacobs, MD  INTERVAL HISTORY: he returns for surveillance follow-up for myeloproliferative disorder/neoplasm Patient denies recent bleeding such as epistaxis, hematuria or hematochezia No recent infection We reviewed medication list and discussed medication changes We discussed test results and future plan of care as outlined above  PHYSICAL EXAMINATION: ECOG PERFORMANCE STATUS: 0 - Asymptomatic  Vitals:   12/07/23 0817  BP: (!) 147/88  Pulse: 78  Resp: 18  Temp: 97.6 F (36.4 C)  SpO2: 98%   Lab Results  Component Value  Date   WBC 7.0 11/28/2023   HGB 10.9 (L) 11/28/2023   HCT 33.3 (L) 11/28/2023   MCV 94.1 11/28/2023   PLT 184 11/28/2023

## 2023-12-07 NOTE — Assessment & Plan Note (Addendum)
 I have reviewed test results with the patient and his wife He is compliant taking his medication as directed His recent molecular test showed undetectable BCR/ABL; he is still in major molecular response For now, we will continue close monitoring and follow-up in 3 months

## 2023-12-10 NOTE — Progress Notes (Unsigned)
 Cardiology Office Note Date:  12/10/2023  Patient ID:  Anthony Wolfe, Anthony Wolfe 1946-07-23, MRN [id number] PCP:  Austine Lefort, MD  Electrophysiologist:  Dr. Rodolfo Clan Cardiologist: Dr. Lorie Rook Pulmonary: Dr. Matilde Son     Chief Complaint:  *** annual device visit  History of Present Illness: Anthony Wolfe is a 78 y.o. male with history of  VT (+EP inducible VT) w/ICD,  CAD (CABG 2010),  PVD (R CEA, known occluded L ICA),  chronic CHF (systolic), ICM,  CML (follows with oncology), emphysema,  HTN, HLD, CVA. AFlutter > ablated  Most recently saw Dr. Lorie Rook 08/17/23, had recent URI (COPD exacerbation), saw his PMD reported an irregular heart rhythm At this visit, feeling better, good exertional capacity, had been hunting, did well. + for some orthostatic symptoms, SR on his EKG  *** symptoms *** monitoring options? Tech, ? AF ***   Device information: MDT single chamber ICD, implanted 05/21/14, Dr. Rodolfo Clan  Arrhythmia/AAD:  VT: + inducible VT on EP mexiletine started Sept 2018 >> stopped April 2023 (unclear > seems associated with the time AFib was discovered, note reports insurance declined) ? Intolerant of amio  AFib discussed 2023 Subsequently EP Dr. 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 difficile colitis 06/16/2014   CAD (coronary artery disease)    a. s/p 3v CABG 2010. b. NSTEMI 05/2014 in setting of VT. Cath impressions: "Recent IMI with occluded SVG to RCA. Has Right to Right collaterals and collaterals from septal and OM. EF 40%."   Carotid disease, bilateral (HCC)    a. Right CEA 2002; known occluded left carotid. b. Duplex 06/2014: known occluded LICA, stable 1-39% RICA s/p CEA.   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 Prior EF 36%. b. 2014: 50%. c. 05/2014: EF 35-40% by echo, 40% with inf HK by cath.   NSTEMI (non-ST elevation myocardial infarction) (HCC) 05/08/2014   Pleural effusion 05/08/2014   Pneumonia 05/23/2014   Presence of permanent cardiac pacemaker    Prostate hypertrophy    PVC's (premature ventricular contractions)    a. PVC's with prior Holter showing PVC load of 22%.   Shortness of breath dyspnea    Stroke (HCC) 08/09/1999   TIA (transient ischemic attack)    ASPVD, S./P. right CEA, 2002, and known occluded left carotid   Ventricular tachycardia (HCC)    a. Admitted with VT 05/2013 - EPS with inducible sustained monomorphic VT; s/p Medtronic ICD 05/21/14.    Past Surgical History:  Procedure Laterality Date   A-FLUTTER ABLATION N/A 01/06/2023   Procedure: A-FLUTTER ABLATION;  Surgeon: Tammie Fall, MD;  Location: [facility] INVASIVE CV LAB;  Service: Cardiovascular;  Laterality: N/A;   BACK SURGERY     CARDIOVERSION N/A 01/04/2022   Procedure: CARDIOVERSION;  Surgeon: Euell Herrlich, MD;  Location: [facility] ENDOSCOPY;  Service: Cardiovascular;  Laterality: N/A;   CARDIOVERSION N/A 11/24/2022   Procedure: CARDIOVERSION;  Surgeon: Jerryl Morin, DO;  Location: [facility] INVASIVE CV LAB;  Service: Cardiovascular;  Laterality: N/A;   CAROTID ENDARTERECTOMY Right 01   carotidectomy  2003   right side   COLONOSCOPY  07/2010   CORONARY ARTERY BYPASS GRAFT  02/2009   3 vessel   ELECTROPHYSIOLOGIC STUDY  05/21/14   ELECTROPHYSIOLOGY STUDY N/A 05/21/2014   Procedure: ELECTROPHYSIOLOGY STUDY;  Surgeon: Verona Goodwill, MD;  Location: [facility] CATH LAB;  Service: Cardiovascular;  Laterality: N/A;   EP IMPLANTABLE DEVICE  05/21/14   single chamber Metronic ICD   IR THORACENTESIS ASP PLEURAL SPACE W/IMG GUIDE  12/07/2021    LEFT HEART CATHETERIZATION WITH CORONARY/GRAFT ANGIOGRAM N/A 05/19/2014   Procedure: LEFT HEART CATHETERIZATION WITH Estella Helling;  Surgeon: Loyde Rule, MD;  Location: [facility] CATH LAB;  Service: Cardiovascular;  Laterality: N/A;   PLEURAL BIOPSY N/A 11/27/2014   Procedure: PLEURAL BIOPSY;  Surgeon: Zelphia Higashi, MD;  Location: [facility] OR;  Service: Thoracic;  Laterality: N/A;   PLEURAL EFFUSION DRAINAGE Right 11/27/2014   Procedure: DRAINAGE OF PLEURAL EFFUSION;  Surgeon: Zelphia Higashi, MD;  Location: [facility] OR;  Service: Thoracic;  Laterality: Right;   TALC  PLEURODESIS Right 11/27/2014   Procedure: TALC  PLEURADESIS;  Surgeon: Zelphia Higashi, MD;  Location: [facility] OR;  Service: Thoracic;  Laterality: Right;   VIDEO ASSISTED THORACOSCOPY Right 11/27/2014   Procedure: RIGHT VIDEO ASSISTED THORACOSCOPY;  Surgeon: Zelphia Higashi, MD;  Location: [facility] OR;  Service: Thoracic;  Laterality: Right;    Current Outpatient Medications  Medication Sig Dispense Refill   aspirin  EC 81 MG tablet Take 1 tablet (81 mg total) by mouth daily. Swallow whole.     atorvastatin  (LIPITOR ) 80 MG tablet TAKE 1 TABLET BY MOUTH ONCE DAILY AT  6PM 90 tablet 3   carvedilol  (COREG ) 25 MG tablet Take 1 tablet (25 mg total) by mouth 2 (two) times daily with a meal. 180 tablet 3   diphenhydramine -acetaminophen  (TYLENOL  PM) 25-500 MG TABS tablet Take 1 tablet by mouth at bedtime as needed (sleep).     empagliflozin  (JARDIANCE ) 25 MG TABS tablet Take 1 tablet (25 mg total) by mouth daily before breakfast. 90 tablet 3   ezetimibe  (ZETIA ) 10 MG tablet Take 1 tablet by mouth once daily 90 tablet 3   furosemide  (LASIX ) 20 MG tablet Take 1 tablet (20 mg total) by mouth as needed. Take 1 tablet daily as needed for swelling. 90 tablet 3   meclizine  (ANTIVERT ) 25 MG tablet Take 25 mg by mouth 3 (three) times daily as needed for dizziness.     nilotinib  (TASIGNA ) 50 MG capsule Take 2 capsules (100 mg total) by mouth  every 12 (twelve) hours. Take on empty stomach, at least one hour before or two hours after food. (Patient taking differently: Take 50 mg by mouth every 12 (twelve) hours. Take on empty stomach, at least one hour before or two hours after food.) 360 capsule 0   nitroGLYCERIN  (NITROSTAT ) 0.4 MG SL tablet Place 0.4 mg under the tongue every 5 (five) minutes as needed for chest pain.     No current facility-administered medications for this visit.    Allergies:   Breztri aerosphere [budeson-glycopyrrol-formoterol], Norvasc [amlodipine besylate], and Sulfa antibiotics   Social History:  The patient  reports that he quit smoking about 26 years ago. His smoking use included cigarettes. He started smoking about 71 years ago. He has a 135 pack-year smoking history. He has been exposed to tobacco smoke. He has quit using smokeless tobacco. He reports that he does not drink alcohol and does  not use drugs.   Family History:  The patient's family history includes Alcohol abuse in his father; Heart disease in his mother.  ROS:  Please see the history of present illness.  All other systems are reviewed and otherwise negative.   PHYSICAL EXAM:  VS:  There were no vitals taken for this visit. BMI: There is no height or weight on file to calculate BMI. Well nourished, well developed, in no acute distress  HEENT: normocephalic, atraumatic  Neck: no JVD, carotid bruits or masses Cardiac: *** RRR; no significant murmurs, no rubs, or gallops Lungs: *** CTA b/l, no wheezing, rhonchi or rales  Abd: soft, nontender MS: no deformity, age appropriate atrophy Ext: *** no edema  Skin: warm and dry, no rash Neuro:  No gross deficits appreciated Psych: euthymic mood, full affect  *** ICD site is stable, no tethering or discomfort   EKG:   not done today   ICD interrogation done today and reviewed by myself:  *** Battery and lead measurements are good ***   03/14/23: TTE 1. Left ventricular ejection fraction,  by estimation, is 45 to 50%. Left  ventricular ejection fraction by 3D volume is 50 %. The left ventricle has  mildly decreased function. The left ventricle demonstrates regional wall  motion abnormalities (see  scoring diagram/findings for description). There is mild concentric left  ventricular hypertrophy. Left ventricular diastolic parameters are  consistent with Grade I diastolic dysfunction (impaired relaxation). There  is moderate hypokinesis of the left  ventricular, entire inferior wall.   2. Right ventricular systolic function is normal. The right ventricular  size is normal. Tricuspid regurgitation signal is inadequate for assessing  PA pressure.   3. Left atrial size was moderately dilated.   4. Right atrial size was moderately dilated.   5. The mitral valve is normal in structure. Mild mitral valve  regurgitation.   6. The aortic valve is tricuspid. Aortic valve regurgitation is not  visualized.   7. There is borderline dilatation of the aortic root, measuring 38 mm.   8. The inferior vena cava is normal in size with greater than 50%  respiratory variability, suggesting right atrial pressure of 3 mmHg.   Comparison(s): No significant change from prior study. Prior images  reviewed side by side.   01/06/23: EPS/ablation (Dr. Carolynne Citron) Conclusion: successful EP study and catheter ablation of the atrial flutter isthmus.   05/28/2020: TTE IMPRESSIONS   1. Left ventricular ejection fraction, by estimation, is 40 to 45%. The  left ventricle has mildly decreased function. The left ventricle  demonstrates global hypokinesis. There is moderate left ventricular  hypertrophy. Left ventricular diastolic  parameters are indeterminate.   2. Right ventricular systolic function is normal. The right ventricular  size is normal. There is normal pulmonary artery systolic pressure.   3. Left atrial size was severely dilated.   4. The mitral valve is normal in structure. Mild mitral valve   regurgitation. No evidence of mitral stenosis.   5. The aortic valve is tricuspid. Aortic valve regurgitation is mild. No  aortic stenosis is present.   6. The inferior vena cava is normal in size with greater than 50%  respiratory variability, suggesting right atrial pressure of 3 mmHg.    04/13/17: lexiscan  stress test Nuclear stress EF: 35%. Inferior/inferoseptal defect consistent iwht scar No significant ischemia Intermediate risk study  06/07/16: TTE Study Conclusions - Left ventricle: Global longitudinal LV strain is abnormal at -12%   The cavity size was normal. There was  moderate focal basal and   mild concentric hypertrophy. Systolic function was mildly   reduced. The estimated ejection fraction was in the range of 45%   to 50%. There is akinesis of the basal-midinferior myocardium. - Aortic valve: Mildly to moderately calcified annulus. Trileaflet;   mildly thickened, mildly calcified leaflets. - Aorta: Aortic root dimension: 38 mm (ED). - Aortic root: The aortic root was mildly dilated. - Mitral valve: There was trivial regurgitation. - Left atrium: The atrium was mildly to moderately dilated. - Right ventricle: The cavity size was mildly dilated. Wall   thickness was normal. - Tricuspid valve: There was trivial regurgitation.   11/14/16: Carotid US  IMPRESSION: 1. Surgical changes of prior right internal carotid endarterectomy with mild residual heterogeneous atherosclerotic plaque. By peak systolic velocity criteria, the estimated stenosis falls in the 1 - 49% range. 2. 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 3. The vertebral arteries are patent with normal antegrade flow bilaterally.     Recent Labs: 11/28/2023: ALT 10; BUN 13; Creatinine, Ser 0.72; Hemoglobin 10.9; Platelets 184; Potassium 4.4; Sodium 143  09/18/2023: Cholesterol 104; HDL 38; LDL Cholesterol (Calc)  48; Total CHOL/HDL Ratio 2.7; Triglycerides 94   Estimated Creatinine Clearance: 74.9 mL/min (by C-G formula based on SCr of 0.72 mg/dL).   Wt Readings from Last 3 Encounters:  12/07/23 179 lb 9.6 oz (81.5 kg)  11/03/23 181 lb (82.1 kg)  10/20/23 184 lb (83.5 kg)     Other studies reviewed: Additional studies/records reviewed today include: summarized above  ASSESSMENT AND PLAN:  1. ICD     *** Intact function     *** no changes made   2. VT     *** On mexiletine/BB     ***  3. ICM, chronic CHF (systolic)     *** No symptoms or exam findings of volume OL     OptiVol ***     *** On BB, Entresto , PRN diuretic     C/w Dr. Nedra Ball  4. CAD     No ischemia on 2018 stress testing     *** No anginal complaints     *** on Plavix  with CAD/PVD, BB, statin tx     C/w Dr. Neale Bale  5. HTN     ***   6. Carotid disease *** They report he follows with Dr. Shirley Douglas    Disposition: ***     Current medicines are reviewed at length with the patient today.  The patient did not have any concerns regarding medicines.  [place], PA-C 12/10/2023 9:49 AM     [facility] [address] [place] [address] [phone] (office)  [phone] (fax)

## 2023-12-11 ENCOUNTER — Encounter: Payer: Self-pay | Admitting: Physician Assistant

## 2023-12-11 ENCOUNTER — Ambulatory Visit: Attending: Physician Assistant | Admitting: Physician Assistant

## 2023-12-11 VITALS — BP 148/66 | HR 81 | Ht 69.0 in | Wt 180.0 lb

## 2023-12-11 DIAGNOSIS — I2510 Atherosclerotic heart disease of native coronary artery without angina pectoris: Secondary | ICD-10-CM

## 2023-12-11 DIAGNOSIS — Z95810 Presence of automatic (implantable) cardiac defibrillator: Secondary | ICD-10-CM | POA: Diagnosis not present

## 2023-12-11 DIAGNOSIS — I10 Essential (primary) hypertension: Secondary | ICD-10-CM

## 2023-12-11 DIAGNOSIS — I472 Ventricular tachycardia, unspecified: Secondary | ICD-10-CM | POA: Diagnosis not present

## 2023-12-11 DIAGNOSIS — I5022 Chronic systolic (congestive) heart failure: Secondary | ICD-10-CM

## 2023-12-11 DIAGNOSIS — I255 Ischemic cardiomyopathy: Secondary | ICD-10-CM

## 2023-12-11 LAB — CUP PACEART INCLINIC DEVICE CHECK
Battery Remaining Longevity: 23 mo
Battery Voltage: 2.94 V
Brady Statistic RV Percent Paced: 0.1 %
Date Time Interrogation Session: 20250505120223
HighPow Impedance: 55 Ohm
Implantable Lead Connection Status: 753985
Implantable Lead Implant Date: 20151014
Implantable Lead Location: 753860
Implantable Pulse Generator Implant Date: 20151014
Lead Channel Impedance Value: 418 Ohm
Lead Channel Impedance Value: 456 Ohm
Lead Channel Pacing Threshold Amplitude: 0.625 V
Lead Channel Pacing Threshold Pulse Width: 0.4 ms
Lead Channel Sensing Intrinsic Amplitude: 3.5 mV
Lead Channel Sensing Intrinsic Amplitude: 4.125 mV
Lead Channel Setting Pacing Amplitude: 2 V
Lead Channel Setting Pacing Pulse Width: 0.4 ms
Lead Channel Setting Sensing Sensitivity: 0.3 mV
Zone Setting Status: 755011

## 2023-12-11 NOTE — Patient Instructions (Signed)
 Medication Instructions:   Your physician recommends that you continue on your current medications as directed. Please refer to the Current Medication list given to you today.   *If you need a refill on your cardiac medications before your next appointment, please call your pharmacy*   Lab Work: NONE ORDERED  TODAY    If you have labs (blood work) drawn today and your tests are completely normal, you will receive your results only by: MyChart Message (if you have MyChart) OR A paper copy in the mail If you have any lab test that is abnormal or we need to change your treatment, we will call you to review the results.    Testing/Procedures: NONE ORDERED  TODAY    Follow-Up: At [facility], you and your health needs are our priority.  As part of our continuing mission to provide you with exceptional heart care, our providers are all part of one team.  This team includes your primary Cardiologist (physician) and Advanced Practice Providers or APPs (Physician Assistants and Nurse Practitioners) who all work together to provide you with the care you need, when you need it.  Your next appointment:    1 year(s)  Provider:   You may see Manya Sells, MD or one of the following Advanced Practice Providers on your designated Care Team:   Mertha Abrahams, [place]  We recommend signing up for the patient portal called "MyChart".  Sign up information is provided on this After Visit Summary.  MyChart is used to connect with patients for Virtual Visits (Telemedicine).  Patients are able to view lab/test results, encounter notes, upcoming appointments, etc.  Non-urgent messages can be sent to your provider as well.   To learn more about what you can do with MyChart, go to [email or web address].   Other Instructions

## 2024-01-15 NOTE — Progress Notes (Signed)
 Remote ICD transmission.

## 2024-01-15 NOTE — Addendum Note (Signed)
 Addended by: Edra Govern D on: 01/15/2024 03:20 PM   Modules accepted: Orders

## 2024-01-17 ENCOUNTER — Ambulatory Visit (INDEPENDENT_AMBULATORY_CARE_PROVIDER_SITE_OTHER)

## 2024-01-17 VITALS — Ht 69.0 in | Wt 180.0 lb

## 2024-01-17 DIAGNOSIS — Z0000 Encounter for general adult medical examination without abnormal findings: Secondary | ICD-10-CM

## 2024-01-17 NOTE — Patient Instructions (Signed)
 [name] , Thank you for taking time out of your busy schedule to complete your Annual Wellness Visit with me. I enjoyed our conversation and look forward to speaking with you again next year. I, as well as your care team,  appreciate your ongoing commitment to your health goals. Please review the following plan we discussed and let me know if I can assist you in the future. Your Game plan/ To Do List    Follow up Visits: Next Medicare AWV with our clinical staff: In 1 year    Have you seen your provider in the last 6 months (3 months if uncontrolled diabetes)? Yes Next Office Visit with your provider: To be scheduled   Clinician Recommendations:  Aim for 30 minutes of exercise or brisk walking, 6-8 glasses of water, and 5 servings of fruits and vegetables each day.       This is a list of the screening recommended for you and due dates:  Health Maintenance  Topic Date Due   Hepatitis C Screening  09/17/2024*   Flu Shot  03/08/2024   Medicare Annual Wellness Visit  01/16/2025   Pneumonia Vaccine  Completed   HPV Vaccine  Aged Out   Meningitis B Vaccine  Aged Out   DTaP/Tdap/Td vaccine  Discontinued   COVID-19 Vaccine  Discontinued   Zoster (Shingles) Vaccine  Discontinued  *Topic was postponed. The date shown is not the original due date.    Advanced directives: (ACP Link)Information on Advanced Care Planning can be found at [employer]  Secretary of [facility] Advance Health Care Directives Advance Health Care Directives. [email or web address]   Advance Care Planning is important because it:  [x]  Makes sure you receive the medical care that is consistent with your values, goals, and preferences  [x]  It provides guidance to your family and loved ones and reduces their decisional burden about whether or not they are making the right decisions based on your wishes.  Follow the link provided in your after visit summary or read over the paperwork we have mailed to you to help you started getting  your Advance Directives in place. If you need assistance in completing these, please reach out to us  so that we can help you!  See attachments for Preventive Care and Fall Prevention Tips.

## 2024-01-17 NOTE — Progress Notes (Signed)
 Subjective:   Anthony Wolfe is a 78 y.o. who presents for a Medicare Wellness preventive visit.  As a reminder, Annual Wellness Visits don't include a physical exam, and some assessments may be limited, especially if this visit is performed virtually. We may recommend an in-person follow-up visit with your provider if needed.  Visit Complete: Virtual I connected with  Anthony Wolfe on 01/17/24 by a audio enabled telemedicine application and verified that I am speaking with the correct person using two identifiers.  Patient Location: Home  Provider Location: Home Office  I discussed the limitations of evaluation and management by telemedicine. The patient expressed understanding and agreed to proceed.  Vital Signs: Because this visit was a virtual/telehealth visit, some criteria may be missing or patient reported. Any vitals not documented were not able to be obtained and vitals that have been documented are patient reported.  VideoDeclined- This patient declined Librarian, academic. Therefore the visit was completed with audio only.  Persons Participating in Visit: Patient.  AWV Questionnaire: No: Patient Medicare AWV questionnaire was not completed prior to this visit.  Cardiac Risk Factors include: advanced age (>17men, >75 women);male gender;dyslipidemia;hypertension     Objective:     Today's Vitals   01/17/24 1442  Weight: 180 lb (81.6 kg)  Height: 5' 9 (1.753 m)   Body mass index is 26.58 kg/m.     01/17/2024    3:41 PM 01/06/2023    8:11 AM 11/17/2022   10:13 AM 01/04/2022    9:03 AM 12/07/2021    8:22 AM 11/11/2021   10:53 AM 08/10/2021   10:47 AM  Advanced Directives  Does Patient Have a Medical Advance Directive? No Yes Yes Yes No Yes Yes  Type of Special educational needs teacher of [place];Living will Living will;Healthcare Power of [employer] Power of [employer] Power of [place];Living will Healthcare Power of  Attorney  Does patient want to make changes to medical advance directive?  No - Patient declined No - Patient declined      Copy of Healthcare Power of Attorney in Chart?  No - copy requested Yes - validated most recent copy scanned in chart (See row information)   Yes - validated most recent copy scanned in chart (See row information) No - copy requested  Would patient like information on creating a medical advance directive? Yes (MAU/Ambulatory/Procedural Areas - Information given)    No - Patient declined      Current Medications (verified) Outpatient Encounter Medications as of 01/17/2024  Medication Sig   aspirin  EC 81 MG tablet Take 1 tablet (81 mg total) by mouth daily. Swallow whole.   atorvastatin  (LIPITOR ) 80 MG tablet TAKE 1 TABLET BY MOUTH ONCE DAILY AT  6PM   carvedilol  (COREG ) 25 MG tablet Take 1 tablet (25 mg total) by mouth 2 (two) times daily with a meal.   diphenhydramine -acetaminophen  (TYLENOL  PM) 25-500 MG TABS tablet Take 1 tablet by mouth at bedtime as needed (sleep).   empagliflozin  (JARDIANCE ) 25 MG TABS tablet Take 1 tablet (25 mg total) by mouth daily before breakfast.   ezetimibe  (ZETIA ) 10 MG tablet Take 1 tablet by mouth once daily   meclizine  (ANTIVERT ) 25 MG tablet Take 25 mg by mouth 3 (three) times daily as needed for dizziness.   nilotinib  (TASIGNA ) 50 MG capsule Take 2 capsules (100 mg total) by mouth every 12 (twelve) hours. Take on empty stomach, at least one hour before or two hours after  food. (Patient taking differently: Take 50 mg by mouth every 12 (twelve) hours. Take on empty stomach, at least one hour before or two hours after food.)   nitroGLYCERIN  (NITROSTAT ) 0.4 MG SL tablet Place 0.4 mg under the tongue every 5 (five) minutes as needed for chest pain.   furosemide  (LASIX ) 20 MG tablet Take 1 tablet (20 mg total) by mouth as needed. Take 1 tablet daily as needed for swelling.   No facility-administered encounter medications on file as of 01/17/2024.     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 difficile colitis 06/16/2014   CAD (coronary artery disease)    a. s/p 3v CABG 2010. b. NSTEMI 05/2014 in setting of VT. Cath impressions: Recent IMI with occluded SVG to RCA. Has Right to Right collaterals and collaterals from septal and OM. EF 40%.   Carotid disease, bilateral (HCC)    a. Right CEA 2002; known occluded left carotid. b. Duplex 06/2014: known occluded LICA, stable 1-39% RICA s/p CEA.   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 Prior EF 36%. b. 2014: 50%. c. 05/2014: EF 35-40% by echo, 40% with inf HK by cath.   NSTEMI (non-ST elevation myocardial infarction) (HCC) 05/08/2014   Pleural effusion 05/08/2014   Pneumonia 05/23/2014   Presence of permanent cardiac pacemaker    Prostate hypertrophy    PVC's (premature ventricular contractions)    a. PVC's with prior Holter showing PVC load of 22%.   Shortness of breath dyspnea    Stroke (HCC) 08/09/1999   TIA (transient ischemic attack)    ASPVD, S./P. right CEA, 2002, and known occluded left carotid   Ventricular tachycardia (HCC)    a. Admitted with VT 05/2013 - EPS with inducible sustained monomorphic VT; s/p Medtronic ICD 05/21/14.   Past Surgical History:  Procedure Laterality Date   A-FLUTTER ABLATION N/A 01/06/2023   Procedure: A-FLUTTER ABLATION;  Surgeon: Tammie Fall, MD;  Location: [facility] INVASIVE CV LAB;  Service: Cardiovascular;  Laterality: N/A;   BACK  SURGERY     CARDIOVERSION N/A 01/04/2022   Procedure: CARDIOVERSION;  Surgeon: Euell Herrlich, MD;  Location: [facility] ENDOSCOPY;  Service: Cardiovascular;  Laterality: N/A;   CARDIOVERSION N/A 11/24/2022   Procedure: CARDIOVERSION;  Surgeon: Jerryl Morin, DO;  Location: [facility] INVASIVE CV LAB;  Service: Cardiovascular;  Laterality: N/A;   CAROTID ENDARTERECTOMY Right 01   carotidectomy  2003   right side   COLONOSCOPY  07/2010   CORONARY ARTERY BYPASS GRAFT  02/2009   3 vessel   ELECTROPHYSIOLOGIC STUDY  05/21/14   ELECTROPHYSIOLOGY STUDY N/A 05/21/2014   Procedure: ELECTROPHYSIOLOGY STUDY;  Surgeon: Verona Goodwill, MD;  Location: [facility] CATH LAB;  Service: Cardiovascular;  Laterality: N/A;   EP IMPLANTABLE DEVICE  05/21/14   single chamber Metronic ICD   IR THORACENTESIS ASP PLEURAL SPACE W/IMG GUIDE  12/07/2021   LEFT HEART CATHETERIZATION WITH CORONARY/GRAFT ANGIOGRAM N/A 05/19/2014   Procedure: LEFT HEART CATHETERIZATION WITH CORONARY/GRAFT ANGIOGRAM;  Surgeon:  Loyde Rule, MD;  Location: [facility] CATH LAB;  Service: Cardiovascular;  Laterality: N/A;   PLEURAL BIOPSY N/A 11/27/2014   Procedure: PLEURAL BIOPSY;  Surgeon: Zelphia Higashi, MD;  Location: [facility] OR;  Service: Thoracic;  Laterality: N/A;   PLEURAL EFFUSION DRAINAGE Right 11/27/2014   Procedure: DRAINAGE OF PLEURAL EFFUSION;  Surgeon: Zelphia Higashi, MD;  Location: [facility] OR;  Service: Thoracic;  Laterality: Right;   TALC  PLEURODESIS Right 11/27/2014   Procedure: TALC  PLEURADESIS;  Surgeon: Zelphia Higashi, MD;  Location: [facility] OR;  Service: Thoracic;  Laterality: Right;   VIDEO ASSISTED THORACOSCOPY Right 11/27/2014   Procedure: RIGHT VIDEO ASSISTED THORACOSCOPY;  Surgeon: Zelphia Higashi, MD;  Location: [facility] OR;  Service: Thoracic;  Laterality: Right;   Family History  Problem Relation Age of Onset   Heart disease Mother    Alcohol abuse Father    Social History   Socioeconomic History   Marital status: Married    Spouse  name: Not on file   Number of children: 2   Years of education: Not on file   Highest education level: Not on file  Occupational History    Comment: retired Administrator  Tobacco Use   Smoking status: Former    Current packs/day: 0.00    Average packs/day: 3.0 packs/day for 45.0 years (135.0 ttl pk-yrs)    Types: Cigarettes    Start date: 08/08/1952    Quit date: 08/08/1997    Years since quitting: 26.4    Passive exposure: Past   Smokeless tobacco: Former  Building services engineer status: Never Used  Substance and Sexual Activity   Alcohol use: No    Alcohol/week: 0.0 standard drinks of alcohol   Drug use: No   Sexual activity: Yes  Other Topics Concern   Not on file  Social History Narrative   Not on file   Social Drivers of Health   Financial Resource Strain: Low Risk  (01/17/2024)   Overall Financial Resource Strain (CARDIA)    Difficulty of Paying Living Expenses: Not hard at all  Food Insecurity: No Food Insecurity (01/17/2024)   Hunger Vital Sign    Worried About Running Out of Food in the Last Year: Never true    Ran Out of Food in the Last Year: Never true  Transportation Needs: No Transportation Needs (01/17/2024)   PRAPARE - Administrator, Civil Service (Medical): No    Lack of Transportation (Non-Medical): No  Physical Activity: Insufficiently Active (01/17/2024)   Exercise Vital Sign    Days of Exercise per Week: 3 days    Minutes of Exercise per Session: 30 min  Stress: No Stress Concern Present (01/17/2024)   [employer] of Occupational Health - Occupational Stress Questionnaire    Feeling of Stress : Not at all  Social Connections: Socially Integrated (01/17/2024)   Social Connection and Isolation Panel [NHANES]    Frequency of Communication with Friends and Family: More than three times a week    Frequency of Social Gatherings with Friends and Family: More than three times a week    Attends Religious Services: More than 4 times per year    Active  Member of [employer] or Organizations: Yes    Attends Engineer, structural: More than 4 times per year    Marital Status: Married    Tobacco Counseling Counseling given: Not Answered    Clinical Intake:  Pre-visit preparation completed: Yes  Pain : No/denies pain  Diabetes: No  Lab Results  Component Value Date   HGBA1C 5.1 05/31/2019   HGBA1C 5.5 08/12/2013   HGBA1C  02/20/2009    5.5 (NOTE) The ADA recommends the following therapeutic goal for glycemic control related to Hgb A1c measurement: Goal of therapy: <6.5 Hgb A1c  Reference: American Diabetes Association: Clinical Practice Recommendations 2010, Diabetes Care, 2010, 33: (Suppl  1).     How often do you need to have someone help you when you read instructions, pamphlets, or other written materials from your doctor or pharmacy?: 1 - Never  Interpreter Needed?: No  Information entered by :: Anthony Cypress LPN   Activities of Daily Living     01/17/2024    2:42 PM  In your present state of health, do you have any difficulty performing the following activities:  Hearing? 0  Vision? 0  Difficulty concentrating or making decisions? 0  Walking or climbing stairs? 0  Dressing or bathing? 0  Doing errands, shopping? 0  Preparing Food and eating ? N  Using the Toilet? N  In the past six months, have you accidently leaked urine? N  Do you have problems with loss of bowel control? N  Managing your Medications? N  Managing your Finances? N  Housekeeping or managing your Housekeeping? N    Patient Care Team: Austine Lefort, MD as PCP - General (Family Medicine) Kyra Phy, MD as PCP - Cardiology (Cardiology) Tammie Fall, MD as PCP - Electrophysiology (Cardiology) Tex Filbert., MD as Attending Physician (Cardiology) Austine Lefort, MD (Family Medicine) Zelphia Higashi, MD (Cardiothoracic Surgery) Early, Sherre Docker, MD (Inactive) as Attending Physician (Vascular Surgery) Almeda Jacobs, MD as Consulting Physician (Hematology and Oncology) Verona Goodwill, MD as Consulting Physician (Cardiology) Myrle Aspen, [facility] (Inactive) as Pharmacist (Pharmacist) Nolon Baxter, Fredricka Jenny, [facility] (Inactive) as Pharmacist (Pharmacist) [facility]  I have updated your Care Teams any recent Medical Services you may have received from other providers in the past year.     Assessment:    This is a routine wellness examination for Anthony Wolfe.  Hearing/Vision screen Hearing Screening - Comments:: Denies hearing difficulties   Vision Screening - Comments:: Wears rx glasses - up to date with routine eye exams with [facility]    Goals Addressed   None    Depression Screen     01/17/2024    3:40 PM 09/18/2023    8:42 AM 08/25/2023    4:13 PM 08/07/2023    8:30 AM 03/10/2023    8:11 AM 11/17/2022   10:17 AM 09/02/2022    8:11 AM  PHQ 2/9 Scores  PHQ - 2 Score 0 0 0 2 0 0 0  PHQ- 9 Score  0 0 4       Fall Risk     01/17/2024    3:42 PM 09/18/2023    8:42 AM 08/25/2023    4:13 PM 03/10/2023    8:11 AM 11/17/2022   10:08 AM  Fall Risk   Falls in the past year? 0 0 0 0 0  Number falls in past yr: 0 0 0 0 0  Injury with Fall? 0 0 0 0 0  Risk for fall due to : No Fall Risks No Fall Risks No Fall Risks No Fall Risks No Fall Risks  Follow up Falls prevention discussed;Education provided;Falls evaluation completed Falls prevention discussed;Falls evaluation completed Falls prevention discussed Falls prevention discussed Falls prevention discussed;Education provided;Falls evaluation completed  MEDICARE RISK AT HOME:  Medicare Risk at Home Any stairs in or around the home?: No If so, are there any without handrails?: No Home free of loose throw rugs in walkways, pet beds, electrical cords, etc?: Yes Adequate lighting in your home to reduce risk of falls?: Yes Life alert?: No Use of a cane, walker or w/c?: No Grab bars in the bathroom?: Yes Shower chair or bench in  shower?: No Elevated toilet seat or a handicapped toilet?: Yes  TIMED UP AND GO:  Was the test performed?  No  Cognitive Function: 6CIT completed        01/17/2024    3:42 PM 11/17/2022   10:13 AM 11/11/2021   10:56 AM 03/26/2020    9:32 AM  6CIT Screen  What Year? 0 points 0 points 0 points 0 points  What month? 0 points 0 points 0 points 0 points  What time? 0 points 0 points 0 points 0 points  Count back from 20 0 points 0 points 0 points 0 points  Months in reverse 0 points 0 points 0 points 0 points  Repeat phrase 0 points 0 points 0 points 0 points  Total Score 0 points 0 points 0 points 0 points    Immunizations Immunization History  Administered Date(s) Administered   Fluad Quad(high Dose 65+) 05/14/2019   Influenza, High Dose Seasonal PF 06/22/2018   Influenza,inj,Quad PF,6+ Mos 05/21/2013, 05/07/2014, 04/28/2015, 04/28/2016, 05/09/2017   Pneumococcal Conjugate-13 01/29/2015   Pneumococcal Polysaccharide-23 05/07/2014    Screening Tests Health Maintenance  Topic Date Due   Hepatitis C Screening  09/17/2024 (Originally 01/21/1964)   INFLUENZA VACCINE  03/08/2024   Medicare Annual Wellness (AWV)  01/16/2025   Pneumonia Vaccine 47+ Years old  Completed   HPV VACCINES  Aged Out   Meningococcal B Vaccine  Aged Out   DTaP/Tdap/Td  Discontinued   COVID-19 Vaccine  Discontinued   Zoster Vaccines- Shingrix  Discontinued    Health Maintenance  There are no preventive care reminders to display for this patient.   Additional Screening:  Vision Screening: Recommended annual ophthalmology exams for early detection of glaucoma and other disorders of the eye. Would you like a referral to an eye doctor? No    Dental Screening: Recommended annual dental exams for proper oral hygiene  Community Resource Referral / Chronic Care Management: CRR required this visit?  No   CCM required this visit?  No   Plan:    I have personally reviewed and noted the following in  the patient's chart:   Medical and social history Use of alcohol, tobacco or illicit drugs  Current medications and supplements including opioid prescriptions. Patient is not currently taking opioid prescriptions. Functional ability and status Nutritional status Physical activity Advanced directives List of other physicians Hospitalizations, surgeries, and ER visits in previous 12 months Vitals Screenings to include cognitive, depression, and falls Referrals and appointments  In addition, I have reviewed and discussed with patient certain preventive protocols, quality metrics, and best practice recommendations. A written personalized care plan for preventive services as well as general preventive health recommendations were provided to patient.   Anthony Wolfe, [place]   [id number]   After Visit Summary: (MyChart) Due to this being a telephonic visit, the after visit summary with patients personalized plan was offered to patient via MyChart   Notes: Nothing significant to report at this time.

## 2024-01-26 ENCOUNTER — Other Ambulatory Visit: Payer: Self-pay | Admitting: Internal Medicine

## 2024-02-26 ENCOUNTER — Ambulatory Visit: Payer: Medicare HMO

## 2024-02-26 DIAGNOSIS — I255 Ischemic cardiomyopathy: Secondary | ICD-10-CM | POA: Diagnosis not present

## 2024-02-27 LAB — CUP PACEART REMOTE DEVICE CHECK
Battery Remaining Longevity: 14 mo
Battery Voltage: 2.93 V
Brady Statistic RV Percent Paced: 0.01 %
Date Time Interrogation Session: 20250721043723
HighPow Impedance: 54 Ohm
Implantable Lead Connection Status: 753985
Implantable Lead Implant Date: 20151014
Implantable Lead Location: 753860
Implantable Pulse Generator Implant Date: 20151014
Lead Channel Impedance Value: 361 Ohm
Lead Channel Impedance Value: 418 Ohm
Lead Channel Pacing Threshold Amplitude: 0.625 V
Lead Channel Pacing Threshold Pulse Width: 0.4 ms
Lead Channel Sensing Intrinsic Amplitude: 3.875 mV
Lead Channel Sensing Intrinsic Amplitude: 3.875 mV
Lead Channel Setting Pacing Amplitude: 2 V
Lead Channel Setting Pacing Pulse Width: 0.4 ms
Lead Channel Setting Sensing Sensitivity: 0.3 mV
Zone Setting Status: 755011

## 2024-02-28 ENCOUNTER — Ambulatory Visit: Payer: Self-pay

## 2024-02-28 DIAGNOSIS — I472 Ventricular tachycardia, unspecified: Secondary | ICD-10-CM

## 2024-02-28 DIAGNOSIS — Z79899 Other long term (current) drug therapy: Secondary | ICD-10-CM

## 2024-03-04 ENCOUNTER — Inpatient Hospital Stay: Attending: Hematology and Oncology

## 2024-03-04 DIAGNOSIS — C9210 Chronic myeloid leukemia, BCR/ABL-positive, not having achieved remission: Secondary | ICD-10-CM | POA: Diagnosis not present

## 2024-03-04 LAB — CBC WITH DIFFERENTIAL/PLATELET
Abs Immature Granulocytes: 0.03 K/uL (ref 0.00–0.07)
Basophils Absolute: 0.1 K/uL (ref 0.0–0.1)
Basophils Relative: 1 %
Eosinophils Absolute: 0.2 K/uL (ref 0.0–0.5)
Eosinophils Relative: 3 %
HCT: 37.9 % — ABNORMAL LOW (ref 39.0–52.0)
Hemoglobin: 12.2 g/dL — ABNORMAL LOW (ref 13.0–17.0)
Immature Granulocytes: 0 %
Lymphocytes Relative: 13 %
Lymphs Abs: 1.1 K/uL (ref 0.7–4.0)
MCH: 30.7 pg (ref 26.0–34.0)
MCHC: 32.2 g/dL (ref 30.0–36.0)
MCV: 95.2 fL (ref 80.0–100.0)
Monocytes Absolute: 0.8 K/uL (ref 0.1–1.0)
Monocytes Relative: 9 %
Neutro Abs: 6.7 K/uL (ref 1.7–7.7)
Neutrophils Relative %: 74 %
Platelets: 199 K/uL (ref 150–400)
RBC: 3.98 MIL/uL — ABNORMAL LOW (ref 4.22–5.81)
RDW: 14.7 % (ref 11.5–15.5)
WBC: 9 K/uL (ref 4.0–10.5)
nRBC: 0 % (ref 0.0–0.2)

## 2024-03-04 LAB — COMPREHENSIVE METABOLIC PANEL WITH GFR
ALT: 7 U/L (ref 0–44)
AST: 10 U/L — ABNORMAL LOW (ref 15–41)
Albumin: 3.8 g/dL (ref 3.5–5.0)
Alkaline Phosphatase: 89 U/L (ref 38–126)
Anion gap: 5 (ref 5–15)
BUN: 17 mg/dL (ref 8–23)
CO2: 27 mmol/L (ref 22–32)
Calcium: 8.9 mg/dL (ref 8.9–10.3)
Chloride: 110 mmol/L (ref 98–111)
Creatinine, Ser: 0.91 mg/dL (ref 0.61–1.24)
GFR, Estimated: >60 mL/min (ref >60)
Glucose, Bld: 102 mg/dL — ABNORMAL HIGH (ref 70–99)
Potassium: 4.2 mmol/L (ref 3.5–5.1)
Sodium: 142 mmol/L (ref 135–145)
Total Bilirubin: 0.8 mg/dL (ref 0.0–1.2)
Total Protein: 6.9 g/dL (ref 6.5–8.1)

## 2024-03-05 NOTE — Progress Notes (Unsigned)
 Cardiology Office Note:    Date:  03/06/2024   ID:  Anthony Wolfe, DOB [birth year], MRN [id number]  PCP:  Anthony Butler DASEN, MD   [facility] Providers Cardiologist:  Wendel Haws, MD Referring MD: Anthony Butler DASEN, MD   Chief Complaint/Reason for Referral: Cardiology follow-up  ASSESSMENT:    1. Typical atrial flutter (HCC)   2. Cardiomyopathy, ischemic   3. Coronary artery disease involving native coronary artery of native heart without angina pectoris   4. Hyperlipidemia LDL goal <70   5. Bilateral carotid artery disease, unspecified type (HCC)   6. Aortic atherosclerosis (HCC)   7. ICD (implantable cardioverter-defibrillator) in place       PLAN:    In order of problems listed above: Atrial flutter: Now status post ablation.  Continue aspirin  81 mg Ischemic cardiomyopathy: Continue Coreg  25 mg twice daily, Jardiance  25 mg daily, Lasix  20 mg as needed Coronary artery disease: Continue Coreg  25 mg twice daily, Lipitor  80 mg daily, aspirin  81 mg daily, as needed nitroglycerin  Hyperlipidemia: LDL was 48 in February; continue atorvastatin  80 mg and Zetia  10 mg. Bilateral carotid disease: Continue aspirin  81 mg, Lipitor  80 mg, blood pressure control. Aortic atherosclerosis: Continue aspirin  81 mg, Lipitor  80 mg, blood pressure control. Status post ICD: Followed by EP.   ADDENDUM: Due to lightheadedness, will change Coreg  25mg  BID to Toprol  50mg  QPM.           Dispo:  Return in about 9 months (around 12/05/2024) for Dr Wendel.      Medication Adjustments/Labs and Tests Ordered: Current medicines are reviewed at length with the patient today.  Concerns regarding medicines are outlined above.  The following changes have been made:     Labs/tests ordered: No orders of the defined types were placed in this encounter.   Medication Changes: No orders of the defined types were placed in this encounter.   Current medicines are reviewed at length with the  patient today.  The patient does not have concerns regarding medicines.   I spent 35 minutes reviewing all clinical data during and prior to this visit including all relevant imaging studies, laboratories, clinical information from other health systems and prior notes from both Cardiology and other specialties, interviewing the patient, conducting a complete physical examination, and coordinating care in order to formulate a comprehensive and personalized evaluation and treatment plan.  History of Present Illness:    FOCUSED PROBLEM LIST:   Coronary artery disease CABG LIMA to LAD, vein graft to OM1-OM2, and vein graft to RCA 2010 Vein graft to RCA occluded cath 2015 Ischemic cardiomyopathy EF 45 to 50% TTE 2024 VT Status post ICD Hypertension Peripheral vascular disease Right CEA with known occluded ICA Emphysema Atrial fibrillation/flutter Cardioversion for atrial fibrillation 2023 Atrial flutter ablation 2024 No longer requiring anticoagulation per EP Hyperlipidemia LP(a) 35.6 Aortic atherosclerosis Chest CT 2018 CML Followed by oncology Pleural effusions Treated with multiple thoracenteses Right bundle branch block   March 2023: Patient seen for initial consultation to reestablish cardiovascular care.  At that appointment losartan  was started as well as Jardiance  and his Coreg  was increased to 25 mg.   April 2023: Patient was seen for expedited follow-up due to the detection of atrial fibrillation at his PCPs office.  He was started on Eliquis  5 mg twice daily.  His mexiletine was discontinued.  He was referred for cardioversion.  Additionally his losartan  was started at 25 mg every afternoon.   May 2023: The patient was seen for  expedited follow-up due to worsening shortness of breath.  He was sent to the emergency department due to suspected pleural effusion.  He was admitted and underwent thoracentesis.  Around 850 cc of straw-colored fluid was evacuated.  Cytology was  negative.  Laboratories were consistent with an exudative effusion.   June 2023: In the interim he underwent cardioversion at the end of May to normal sinus rhythm.  He had a nuisance nosebleed earlier this week and stopped his Eliquis .  He has complaints of fatigue.  However he is able to do a lot of activities including working outside.  He denies any significant shortness of breath, paroxysmal nocturnal dyspnea, orthopnea.  He feels better after his cardioversion is well.  He denies any exertional angina.  He has not required emergency room visits or hospitalizations.  He is otherwise well and without complaints.  Plan: Check lipids, Lp(a), CMP, CBC, reflex TSH, and sleep study.  Restart Eliquis .   March 2023: All blood tests were within normal limits.  A sleep study was not performed.  He was recently seen by his pulmonologist.  He endorsed shortness of breath.  A BNP was checked and found to be elevated to 616.  His creatinine was stable around 1.  Because of these constellation of signs and symptoms he was referred for an expedited appointment.  His Lasix  was increased from as needed to 20 mg twice daily.  He tells me he urinated quite a bit in response to the Lasix .  He feels much better however he continues to have some shortness of breath with more than moderate exertion.  He denies any palpitations or severe bleeding.  He denies any paroxysmal nocturnal dyspnea, orthopnea, or exertional chest pain.  He does not wheeze very often.  He tells me after his cardioversion last year he felt much improved.  He cannot pinpoint a day when things became worse for him and describes more of an insidious course over the last few months.  He fortunately has not required any emergency room visits or hospitalizations.  Plan: Refer to EP due to recurrent atrial fibrillation, discontinue losartan  as patient is on valsartan  as well, discontinue Plavix  as patient is on Eliquis , check lipid panel/LFTs.  7/24: In the  interim the patient was seen by EP and was diagnosed with symptomatic atrial flutter.  He underwent cardioversion followed by atrial flutter ablation.  He was seen in EP follow-up earlier this month and was doing well.  He was taken off his losartan  in the interim due to low blood pressures.  He was completely asymptomatic however.  He continues to do well.  He feels much better following the ablation.  He now only takes Lasix  as needed.  He reports an atypical chest pain.  It happens at rest and it wraps around his lower chest and then moved to his back.  It does not occur with exertion.  He does have a history of multiple back surgeries.  He has required no emergency room visits or hospitalizations.  He has had no bleeding or bruising episodes while on Eliquis .  Plan: Increase Jardiance  to 25 mg; discussed with EP regarding need for Eliquis  versus aspirin  monotherapy.  Obtain echocardiogram.  1/25: In the interim I had a discussion with Dr. Waddell regarding Eliquis  therapy and the patient was changed to aspirin  monotherapy only and Eliquis  was discontinued.  His echocardiogram demonstrated ejection fraction of 45 to 50%.  He was seen by his primary care physician last week due to  URI like symptoms and was treated with azithromycin  and prednisone .  Of note his rhythm was irregular.  The patient has been doing very well.  After getting over his URI which was perhaps a COPD exacerbation he has been feeling very well.  He denies any chest pain, ICD shocks, peripheral edema, or paroxysmal nocturnal dyspnea.  He is able to do everything he needs to do in a day.  He was able to enjoy hunting this season and did shoot to deer which he was proud of.  He fortunately has not required any emergency room visits or hospitalizations.  He is tolerating his medical therapy well without issues.  Occasionally gets some orthostatic symptoms when he goes from sitting to standing quickly.  He has had no syncope.  Overall he feels  very well.  Plan: Increase Jardiance  to 25 mg  July 2025:  Patient consents to use of AI scribe.  The patient returns for routine follow-up.  He was seen by EP in May and was doing well.  He experiences lightheadedness upon standing and shortness of breath with exertion, particularly when getting up or engaging in physical activity. He has stopped drinking Powerade, which previously exacerbated his symptoms, and reports improvement in his breathing since discontinuing it.  He has a history of chronic myeloid leukemia, currently in remission, and continues to take Tasigna . He recalls a previous episode of difficulty breathing due to fluid accumulation in his lungs, which was managed with adjustments to his cancer medication. He has a follow-up appointment scheduled for next month.  He denies recent hospitalizations, bleeding, or palpitations. He occasionally experiences chest pain described as 'a belt or something' around his chest, occurring randomly and not specifically after eating or during physical activity. No swelling in his legs and no shocks from his defibrillator.  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 He manages this by taking breaks indoors and drinks Orange Crush and water to stay hydrated, ensuring he consumes more water than soda.  He feels very well, is able to do all of his activities of daily living.      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 difficile colitis 06/16/2014   CAD (coronary artery disease)    a. s/p 3v CABG 2010. b. NSTEMI 05/2014 in setting of VT. Cath impressions: Recent IMI with occluded SVG to RCA. Has Right to Right collaterals and collaterals from septal and OM. EF 40%.   Carotid disease,  bilateral (HCC)    a. Right CEA 2002; known occluded left carotid. b. Duplex 06/2014: known occluded LICA, stable 1-39% RICA s/p CEA.   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 Prior EF 36%. b. 2014: 50%. c. 05/2014: EF 35-40% by echo, 40% with inf HK by cath.   NSTEMI (non-ST elevation myocardial infarction) (HCC) 05/08/2014   Pleural effusion 05/08/2014   Pneumonia 05/23/2014   Presence of permanent cardiac pacemaker    Prostate hypertrophy    PVC's (premature ventricular contractions)  a. PVC's with prior Holter showing PVC load of 22%.   Shortness of breath dyspnea    Stroke (HCC) 08/09/1999   TIA (transient ischemic attack)    ASPVD, S./P. right CEA, 2002, and known occluded left carotid   Ventricular tachycardia (HCC)    a. Admitted with VT 05/2013 - EPS with inducible sustained monomorphic VT; s/p Med[employer] ICD 05/21/14.     Current Medications: Current Meds  Medication Sig   aspirin  EC 81 MG tablet Take 1 tablet (81 mg total) by mouth daily. Swallow whole.   atorvastatin  (LIPITOR ) 80 MG tablet TAKE 1 TABLET BY MOUTH ONCE DAILY AT  6PM   carvedilol  (COREG ) 25 MG tablet TAKE 1 TABLET BY MOUTH TWICE DAILY WITH A MEAL   diphenhydramine -acetaminophen  (TYLENOL  PM) 25-500 MG TABS tablet Take 1 tablet by mouth at bedtime as needed (sleep).   empagliflozin  (JARDIANCE ) 25 MG TABS tablet Take 1 tablet (25 mg total) by mouth daily before breakfast.   ezetimibe  (ZETIA ) 10 MG tablet Take 1 tablet by mouth once daily   furosemide  (LASIX ) 20 MG tablet TAKE 1 TABLET BY MOUTH ONCE DAILY AS NEEDED FOR SWELLING.   meclizine  (ANTIVERT ) 25 MG tablet Take 25 mg by mouth 3 (three) times daily as needed for dizziness.   nilotinib  (TASIGNA ) 50 MG capsule Take 2 capsules (100 mg total) by  mouth every 12 (twelve) hours. Take on empty stomach, at least one hour before or two hours after food. (Patient taking differently: Take 50 mg by mouth every 12 (twelve) hours. Take on empty stomach, at least one hour before or two hours after food.)   nitroGLYCERIN  (NITROSTAT ) 0.4 MG SL tablet Place 0.4 mg under the tongue every 5 (five) minutes as needed for chest pain.     Allergies:    Breztri aerosphere [budeson-glycopyrrol-formoterol], Norvasc [amlodipine besylate], and Sulfa antibiotics   Social History:   Social History   Tobacco Use   Smoking status: Former    Current packs/day: 0.00    Average packs/day: 3.0 packs/day for 45.0 years (135.0 ttl pk-yrs)    Types: Cigarettes    Start date: 08/08/1952    Quit date: 08/08/1997    Years since quitting: 26.5    Passive exposure: Past   Smokeless tobacco: Former  Building services engineer status: Never Used  Substance Use Topics   Alcohol use: No    Alcohol/week: 0.0 standard drinks of alcohol   Drug use: No     Family Hx: Family History  Problem Relation Age of Onset   Heart disease Mother    Alcohol abuse Father      Review of Systems:   Please see the history of present illness.    All other systems reviewed and are negative.     EKGs/Labs/Other Test Reviewed:    EKG: 2025 sinus rhythm right bundle branch block  EKG Interpretation Date/Time:    Ventricular Rate:    PR Interval:    QRS Duration:    QT Interval:    QTC Calculation:   R Axis:      Text Interpretation:           Prior CV studies reviewed:  Cardiac Studies & Procedures   ______________________________________________________________________________________________   STRESS TESTS  MYOCARDIAL PERFUSION IMAGING 04/13/2017  Interpretation Summary  Nuclear stress EF: 35%.  Inferior/inferoseptal defect consistent iwht scar No significant ischemia  Intermediate risk study   ECHOCARDIOGRAM  ECHOCARDIOGRAM COMPLETE  03/14/2023  Narrative ECHOCARDIOGRAM REPORT    Patient Name:  Anthony Wolfe Date of Exam: 03/14/2023 Medical Rec #:  [id number]      Height:       69.0 in Accession #:    [id number]     Weight:       184.8 lb Date of Birth:  09/17/1945      BSA:          1.998 m Patient Age:    77 years       BP:           118/70 mmHg Patient Gender: M              HR:           64 bpm. Exam Location:  [address]  Procedure: 2D Echo, 3D Echo, Cardiac Doppler and Color Doppler  Indications:    I48.91 Atrial Flutter/Fibrillation (status post Ablation)  History:        Patient has prior history of Echocardiogram examinations, most recent 10/27/2021. CHF, CAD, Pacemaker, Defibrillator, Abnormal ECG and Prior CABG, COPD; Arrythmias:Atrial Fibrillation and Atrial Flutter. Ischemic Cardiomyopathy (prior EF 40-45%).  Sonographer:    Heather Hawks RDCS Referring Phys: Shandrea Lusk K Meribeth Vitug  IMPRESSIONS   1. Left ventricular ejection fraction, by estimation, is 45 to 50%. Left ventricular ejection fraction by 3D volume is 50 %. The left ventricle has mildly decreased function. The left ventricle demonstrates regional wall motion abnormalities (see scoring diagram/findings for description). There is mild concentric left ventricular hypertrophy. Left ventricular diastolic parameters are consistent with Grade I diastolic dysfunction (impaired relaxation). There is moderate hypokinesis of the left ventricular, entire inferior wall. 2. Right ventricular systolic function is normal. The right ventricular size is normal. Tricuspid regurgitation signal is inadequate for assessing PA pressure. 3. Left atrial size was moderately dilated. 4. Right atrial size was moderately dilated. 5. The mitral valve is normal in structure. Mild mitral valve regurgitation. 6. The aortic valve is tricuspid. Aortic valve regurgitation is not visualized. 7. There is borderline dilatation of the aortic root, measuring 38 mm. 8. The  inferior vena cava is normal in size with greater than 50% respiratory variability, suggesting right atrial pressure of 3 mmHg.  Comparison(s): No significant change from prior study. Prior images reviewed side by side.  FINDINGS Left Ventricle: Left ventricular ejection fraction, by estimation, is 45 to 50%. Left ventricular ejection fraction by 3D volume is 50 %. The left ventricle has mildly decreased function. The left ventricle demonstrates regional wall motion abnormalities. Moderate hypokinesis of the left ventricular, entire inferior wall. The left ventricular internal cavity size was normal in size. There is mild concentric left ventricular hypertrophy. Abnormal (paradoxical) septal motion, consistent with RV pacemaker. Left ventricular diastolic parameters are consistent with Grade I diastolic dysfunction (impaired relaxation). Indeterminate filling pressures.   LV Wall Scoring: The inferior wall and mid inferolateral segment are hypokinetic.  Right Ventricle: The right ventricular size is normal. No increase in right ventricular wall thickness. Right ventricular systolic function is normal. Tricuspid regurgitation signal is inadequate for assessing PA pressure.  Left Atrium: Left atrial size was moderately dilated.  Right Atrium: Right atrial size was moderately dilated.  Pericardium: There is no evidence of pericardial effusion.  Mitral Valve: The mitral valve is normal in structure. Mild mitral valve regurgitation, with centrally-directed jet.  Tricuspid Valve: The tricuspid valve is normal in structure. Tricuspid valve regurgitation is not demonstrated.  Aortic Valve: The aortic valve is tricuspid. Aortic valve regurgitation is not visualized.  Pulmonic Valve: The pulmonic valve  was normal in structure. Pulmonic valve regurgitation is mild to moderate. No evidence of pulmonic stenosis.  Aorta: The aortic root is normal in size and structure. There is borderline dilatation  of the aortic root, measuring 38 mm.  Venous: The inferior vena cava is normal in size with greater than 50% respiratory variability, suggesting right atrial pressure of 3 mmHg.  IAS/Shunts: No atrial level shunt detected by color flow Doppler.  Additional Comments: A device lead is visualized in the right ventricle.   LEFT VENTRICLE PLAX 2D LVIDd:         5.40 cm         Diastology LVIDs:         4.20 cm         LV e' medial:    6.64 cm/s LV PW:         1.30 cm         LV E/e' medial:  13.9 LV IVS:        1.00 cm         LV e' lateral:   8.00 cm/s LVOT diam:     2.70 cm         LV E/e' lateral: 11.6 LV SV:         87 LV SV Index:   44 LVOT Area:     5.73 cm        3D Volume EF LV 3D EF:    Left ventricul ar ejection fraction by 3D volume is 50 %.  3D Volume EF: 3D EF:        50 % LV EDV:       219 ml LV ESV:       110 ml LV SV:        109 ml  RIGHT VENTRICLE RV Basal diam:  4.10 cm RV Mid diam:    3.60 cm  LEFT ATRIUM             Index        RIGHT ATRIUM           Index LA diam:        4.60 cm 2.30 cm/m   RA Pressure: 3.00 mmHg LA Vol (A2C):   90.1 ml 45.10 ml/m  RA Area:     24.40 cm LA Vol (A4C):   99.6 ml 49.86 ml/m  RA Volume:   78.10 ml  39.09 ml/m LA Biplane Vol: 97.5 ml 48.80 ml/m AORTIC VALVE LVOT Vmax:   72.05 cm/s LVOT Vmean:  43.700 cm/s LVOT VTI:    0.152 m  AORTA Ao Root diam: 3.80 cm Ao Asc diam:  3.20 cm  MITRAL VALVE               TRICUSPID VALVE MV Area (PHT): cm         Estimated RAP:  3.00 mmHg MV Decel Time: 205 msec MV E velocity: 92.58 cm/s  SHUNTS MV A velocity: 99.15 cm/s  Systemic VTI:  0.15 m MV E/A ratio:  0.93        Systemic Diam: 2.70 cm  Jerel Croitoru MD Electronically signed by Jerel Balding MD Signature Date/Time: 03/14/2023/4:33:51 PM    Final          ______________________________________________________________________________________________      Other studies Reviewed: Chest CT 2018  demonstrates aortic atherosclerosis   Recent Labs: 03/04/2024: ALT 7; BUN 17; Creatinine, Ser 0.91; Hemoglobin 12.2; Platelets 199; Potassium 4.2; Sodium 142   Lipid Panel  Component Value Date/Time   CHOL 104 09/18/2023 0922   CHOL 84 (L) 11/02/2022 1403   TRIG 94 09/18/2023 0922   HDL 38 (L) 09/18/2023 0922   HDL 34 (L) 11/02/2022 1403   CHOLHDL 2.7 09/18/2023 0922   VLDL 20 04/05/2016 0830   LDLCALC 48 09/18/2023 0922    Risk Assessment/Calculations:     CHA2DS2-VASc Score = 5   This indicates a 7.2% annual risk of stroke. The patient's score is based upon: CHF History: 1 HTN History: 1 Diabetes History: 0 Stroke History: 0 Vascular Disease History: 1 Age Score: 2 Gender Score: 0               Physical Exam:    VS:  BP 122/66   Pulse 81   Ht 5' 9 (1.753 m)   Wt 182 lb 6.4 oz (82.7 kg)   SpO2 99%   BMI 26.94 kg/m    Wt Readings from Last 3 Encounters:  03/06/24 182 lb 6.4 oz (82.7 kg)  01/17/24 180 lb (81.6 kg)  12/11/23 180 lb (81.6 kg)         GENERAL:  No apparent distress, AOx3 HEENT:  No carotid bruits, +2 carotid impulses, no scleral icterus CAR: RRR no murmurs, gallops, rubs, or thrills RES:  Clear to auscultation bilaterally ABD:  Soft, nontender, nondistended, positive bowel sounds x 4 VASC:  +2 radial pulses, +2 carotid pulses, palpable pedal pulses NEURO:  CN 2-12 grossly intact; motor and sensory grossly intact PSYCH:  No active depression or anxiety EXT:  No edema, ecchymosis, or cyanosis  Signed, Lurena MARLA Red, MD  03/06/2024 9:05 AM    [facility] [address] [place], [place], [place]  [address] Phone: [phone]; Fax: [phone]   Note:  This document was prepared using Dragon voice recognition software and may include unintentional dictation errors.

## 2024-03-06 ENCOUNTER — Encounter: Payer: Self-pay | Admitting: Internal Medicine

## 2024-03-06 ENCOUNTER — Ambulatory Visit: Attending: Cardiology | Admitting: Internal Medicine

## 2024-03-06 VITALS — BP 122/66 | HR 81 | Ht 69.0 in | Wt 182.4 lb

## 2024-03-06 DIAGNOSIS — E785 Hyperlipidemia, unspecified: Secondary | ICD-10-CM

## 2024-03-06 DIAGNOSIS — I483 Typical atrial flutter: Secondary | ICD-10-CM | POA: Diagnosis not present

## 2024-03-06 DIAGNOSIS — Z95810 Presence of automatic (implantable) cardiac defibrillator: Secondary | ICD-10-CM | POA: Diagnosis not present

## 2024-03-06 DIAGNOSIS — I2510 Atherosclerotic heart disease of native coronary artery without angina pectoris: Secondary | ICD-10-CM | POA: Diagnosis not present

## 2024-03-06 DIAGNOSIS — I255 Ischemic cardiomyopathy: Secondary | ICD-10-CM | POA: Diagnosis not present

## 2024-03-06 DIAGNOSIS — I700 Atherosclerosis of aorta: Secondary | ICD-10-CM | POA: Diagnosis not present

## 2024-03-06 DIAGNOSIS — I779 Disorder of arteries and arterioles, unspecified: Secondary | ICD-10-CM | POA: Diagnosis not present

## 2024-03-06 NOTE — Patient Instructions (Signed)
 Medication Instructions:  No changes *If you need a refill on your cardiac medications before your next appointment, please call your pharmacy*  Lab Work: None ordered If you have labs (blood work) drawn today and your tests are completely normal, you will receive your results only by: MyChart Message (if you have MyChart) OR A paper copy in the mail If you have any lab test that is abnormal or we need to change your treatment, we will call you to review the results.  Testing/Procedures: None ordered  Follow-Up: At [facility], you and your health needs are our priority.  As part of our continuing mission to provide you with exceptional heart care, our providers are all part of one team.  This team includes your primary Cardiologist (physician) and Advanced Practice Providers or APPs (Physician Assistants and Nurse Practitioners) who all work together to provide you with the care you need, when you need it.  Your next appointment:   9 month(s)  Provider:   Arun K Thukkani, MD    We recommend signing up for the patient portal called MyChart.  Sign up information is provided on this After Visit Summary.  MyChart is used to connect with patients for Virtual Visits (Telemedicine).  Patients are able to view lab/test results, encounter notes, upcoming appointments, etc.  Non-urgent messages can be sent to your provider as well.   To learn more about what you can do with MyChart, go to [email or web address].

## 2024-03-07 LAB — BCR-ABL1, CML/ALL, PCR, QUANT
E1A2 Transcript: <0.0032 %
Interpretation (BCRAL):: NEGATIVE
b2a2 transcript: <0.0032 %
b3a2 transcript: <0.0032 %

## 2024-03-08 ENCOUNTER — Telehealth: Payer: Self-pay | Admitting: Internal Medicine

## 2024-03-08 MED ORDER — METOPROLOL SUCCINATE ER 50 MG PO TB24: ORAL_TABLET | ORAL | 3 refills | Status: AC

## 2024-03-08 NOTE — Telephone Encounter (Signed)
 Pt c/o medication issue:  1. Name of Medication: carvedilol  (COREG ) 25 MG tablet   2. How are you currently taking this medication (dosage and times per day)? As directed  3. Are you having a reaction (difficulty breathing--STAT)? no  4. What is your medication issue? Patients wife states that Dr. Wendel was supposed to change this medication because he was having dizziness associated with it. I dont see that any medication changes have been made, please advise.

## 2024-03-08 NOTE — Telephone Encounter (Signed)
 Spoke to patient he stated when he saw Dr.Thukkani this week he was going to change Coreg  to a different medication that he would take at night.Stated Coreg  is causing dizziness.I will send message to Dr.Thukkani for advice.

## 2024-03-08 NOTE — Telephone Encounter (Signed)
 Spoke to patient Dr.Thukkani advised to stop Coreg  and start Toprol  XL 50 mg every night.Advised to call back if he continues to have dizziness.

## 2024-03-11 NOTE — Telephone Encounter (Signed)
 Call x1; lvmtcb on pts home phone (cell - vm not set up) to schedule patient with EP APP for f/u following 7/21 remote transmission results.

## 2024-03-12 ENCOUNTER — Encounter: Payer: Self-pay | Admitting: Hematology and Oncology

## 2024-03-12 ENCOUNTER — Inpatient Hospital Stay: Attending: Hematology and Oncology | Admitting: Hematology and Oncology

## 2024-03-12 VITALS — BP 130/75 | HR 77 | Temp 98.0°F | Resp 18 | Ht 69.0 in | Wt 179.4 lb

## 2024-03-12 DIAGNOSIS — I255 Ischemic cardiomyopathy: Secondary | ICD-10-CM | POA: Diagnosis not present

## 2024-03-12 DIAGNOSIS — C9210 Chronic myeloid leukemia, BCR/ABL-positive, not having achieved remission: Secondary | ICD-10-CM | POA: Insufficient documentation

## 2024-03-12 DIAGNOSIS — T451X5A Adverse effect of antineoplastic and immunosuppressive drugs, initial encounter: Secondary | ICD-10-CM | POA: Diagnosis not present

## 2024-03-12 DIAGNOSIS — I472 Ventricular tachycardia, unspecified: Secondary | ICD-10-CM | POA: Diagnosis not present

## 2024-03-12 DIAGNOSIS — D6481 Anemia due to antineoplastic chemotherapy: Secondary | ICD-10-CM | POA: Insufficient documentation

## 2024-03-12 DIAGNOSIS — Z79899 Other long term (current) drug therapy: Secondary | ICD-10-CM | POA: Diagnosis not present

## 2024-03-12 NOTE — Assessment & Plan Note (Addendum)
 He is physically very active He has no clinical signs or symptoms of congestive heart failure and we will continue medical management

## 2024-03-12 NOTE — Assessment & Plan Note (Addendum)
This is likely anemia of chronic disease. The patient denies recent history of bleeding such as epistaxis, hematuria or hematochezia. He is asymptomatic from the anemia. We will observe for now.  

## 2024-03-12 NOTE — Progress Notes (Signed)
 [facility] OFFICE PROGRESS NOTE  Patient Care Team: [name], MD as PCP - General (Family Medicine) Wendel Lurena POUR, MD as PCP - Cardiology (Cardiology) Waddell Danelle ORN, MD as PCP - Electrophysiology (Cardiology) Malva Norleen DEL., MD as Attending Physician (Cardiology) [name], MD (Family Medicine) Kerrin Elspeth BROCKS, MD (Cardiothoracic Surgery) Early, Krystal FALCON, MD (Inactive) as Attending Physician (Vascular Surgery) Lonn Hicks, MD as Consulting Physician (Hematology and Oncology) Fernande Elspeth BROCKS, MD as Consulting Physician (Cardiology) Nicholaus Sherlean CROME, [facility] (Inactive) as Pharmacist (Pharmacist) Nicholaus, Sherlean CROME, [facility] (Inactive) as Pharmacist (Pharmacist) [facility]  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  He was originally treated with Dasatinib  but by 2016, had multiple complications due to recurrent pleural effusion.  His treatment was switched to bosutinib from 2016-2019.  In 2020, he has detectable BCR/ABL and his treatment is subsequently switched to nilotinib  The patient has complete major molecular response since switched to nilotinib   I have reviewed test results with the patient and his wife He is compliant taking his medication as directed and tolerated treatment very well with no side effects His recent molecular test showed undetectable BCR/ABL; he is still in major molecular response For now, we will continue close monitoring and follow-up in 3 months 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 The patient denies recent history of bleeding such as epistaxis, hematuria or hematochezia. He is asymptomatic from the anemia. We will observe for now. Cardiomyopathy, ischemic He is physically very active He has no clinical signs or symptoms of congestive heart failure and we will continue medical  management  No orders of the defined types were placed in this encounter.    Hicks Lonn, MD  INTERVAL HISTORY: he returns for treatment follow-up Complications related to previous cycle of chemotherapy included anemia,  PHYSICAL EXAMINATION: ECOG PERFORMANCE STATUS: 0 - Asymptomatic  No results found for: [id number]    Latest Ref Rng & Units 03/04/2024    8:41 AM 11/28/2023    8:32 AM 09/18/2023    9:22 AM  CBC  WBC 4.0 - 10.5 K/uL 9.0  7.0  7.5   Hemoglobin 13.0 - 17.0 g/dL 87.7  89.0  88.7   Hematocrit 39.0 - 52.0 % 37.9  33.3  34.8   Platelets 150 - 400 K/uL 199  184  244       Chemistry      Component Value Date/Time   NA 142 03/04/2024 0841   NA 142 11/02/2022 1403   NA 142 06/08/2017 0832   K 4.2 03/04/2024 0841   K 3.9 06/08/2017 0832   CL 110 03/04/2024 0841   CL 110 (H) 11/14/2012 0918   CO2 27 03/04/2024 0841   CO2 25 06/08/2017 0832   BUN 17 03/04/2024 0841   BUN 21 11/02/2022 1403   BUN 11.6 06/08/2017 0832   CREATININE 0.91 03/04/2024 0841   CREATININE 1.13 01/22/2021 0840   CREATININE 0.90 05/31/2019 0837   CREATININE 0.9 06/08/2017 0832      Component Value Date/Time   CALCIUM  8.9 03/04/2024 0841   CALCIUM  8.6 06/08/2017 0832   ALKPHOS 89 03/04/2024 0841   ALKPHOS 96 06/08/2017 0832   AST 10 (L) 03/04/2024 0841   AST 13 (L) 01/22/2021 0840   AST 13 06/08/2017 0832   ALT 7 03/04/2024 0841   ALT 14 01/22/2021 0840   ALT 7 06/08/2017 9167  BILITOT 0.8 03/04/2024 0841   BILITOT 0.6 11/02/2022 1403   BILITOT 0.3 01/22/2021 0840   BILITOT 0.44 06/08/2017 0832       Vitals:   03/12/24 0854  BP: 130/75  Pulse: 77  Resp: 18  Temp: 98 F (36.7 C)  SpO2: 100%   Filed Weights   03/12/24 0854  Weight: 179 lb 6.4 oz (81.4 kg)   Other relevant data reviewed during this visit included CBC, CMP, BCR/ABL

## 2024-03-12 NOTE — Assessment & Plan Note (Addendum)
 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  He was originally treated with Dasatinib  but by 2016, had multiple complications due to recurrent pleural effusion.  His treatment was switched to bosutinib from 2016-2019.  In 2020, he has detectable BCR/ABL and his treatment is subsequently switched to nilotinib  The patient has complete major molecular response since switched to nilotinib   I have reviewed test results with the patient and his wife He is compliant taking his medication as directed and tolerated treatment very well with no side effects His recent molecular test showed undetectable BCR/ABL; he is still in major molecular response For now, we will continue close monitoring and follow-up in 3 months

## 2024-03-13 NOTE — Telephone Encounter (Signed)
 Spoke w/ patient yesterday - he is scheduled to see Charlies Arthur, PA on 8/27.

## 2024-03-15 LAB — MAGNESIUM: Magnesium: 2.2 mg/dL (ref 1.6–2.3)

## 2024-03-31 NOTE — Progress Notes (Unsigned)
 Cardiology Office Note Date:  03/31/2024  Patient ID:  Anthony Wolfe Wolfe 08-Apr-1946, MRN [id number] PCP:  [name], MD  Electrophysiologist:  Dr. Fernande Cardiologist: Dr. Wendel Pulmonary: Dr. Shellia     Chief Complaint:   *** abnormal remote  History of Present Illness: Anthony Wolfe Wolfe is a 78 y.o. male with history of  VT (+EP inducible VT) w/ICD,  CAD (CABG 2010),  PVD (R CEA, known occluded L ICA),  chronic CHF (systolic), ICM,  CML (follows with oncology), emphysema,  HTN, HLD, CVA. AFlutter > ablated  He saw Dr. Wendel 08/17/23, had recent URI (COPD exacerbation), saw his PMD reported an irregular heart rhythm At this visit, feeling better, good exertional capacity, had been hunting, did well. + for some orthostatic symptoms, SR on his EKG  I saw her 12/11/23 He is accompanied by his wife No recurrent URI/COPD exacerbations since he saw Dr. Wendel, thank goodness, this he says is his real limiter. Denies any CP, palpitations or cardiac awareness No unusual SOB No near syncope or syncope No shocks Rare NSVT No changes made  Remote noted ATP delivered in June and advised labs and an appt Pt denied any symptoms or missed meds EGM c/w VT > ATP > dirty break  7/28: K+ 4.2 8/5: Mag 2.2  He saw Dr. Wendel 03/06/24, reports orthostatic sounding symptoms, had stopped his powerade No discussion on ATP/VT Working as a Curator Drinks orange soda mostly C/w heme for his CML Coreg  was changed to Toprol  2/2 lightheadedness  TODAY  *** mexiletine?  Follow on toprol ? *** orthostatic? *** WC is reading remotes *** no driving *** more BB? If more VTs  Device information: MDT single chamber ICD, implanted 05/21/14, Dr. Fernande  Arrhythmia/AAD:  VT: + inducible VT on EP mexiletine started Sept 2018 >> stopped April 2023 (unclear > seems associated with the time AFib was discovered, note reports insurance declined) ? Intolerant of amio  AFib discussed  2023 Subsequently EP Dr. 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 difficile colitis 06/16/2014   CAD (coronary artery disease)    a. s/p 3v CABG 2010. b. NSTEMI 05/2014 in setting of VT. Cath impressions: Recent IMI with occluded SVG to RCA. Has Right to Right collaterals and collaterals from septal and OM. EF 40%.   Carotid disease, bilateral (HCC)    a. Right CEA 2002; known occluded left carotid. b. Duplex 06/2014: known occluded LICA, stable 1-39% RICA s/p CEA.   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 Prior EF 36%. b. 2014: 50%. c. 05/2014: EF 35-40% by echo, 40% with inf HK by cath.   NSTEMI (non-ST elevation myocardial infarction) (HCC) 05/08/2014   Pleural effusion 05/08/2014   Pneumonia 05/23/2014   Presence of permanent cardiac pacemaker    Prostate hypertrophy    PVC's (premature ventricular contractions)    a. PVC's with prior Holter showing PVC load of 22%.   Shortness of breath dyspnea    Stroke (HCC)  08/09/1999   TIA (transient ischemic attack)    ASPVD, S./P. right CEA, 2002, and known occluded left carotid   Ventricular tachycardia (HCC)    a. Admitted with VT 05/2013 - EPS with inducible sustained monomorphic VT; s/p Medtronic ICD 05/21/14.    Past Surgical History:  Procedure Laterality Date   A-FLUTTER ABLATION N/A 01/06/2023   Procedure: A-FLUTTER ABLATION;  Surgeon: Waddell Danelle ORN, MD;  Location: [facility] INVASIVE CV LAB;  Service: Cardiovascular;  Laterality: N/A;   BACK SURGERY     CARDIOVERSION N/A 01/04/2022    Procedure: CARDIOVERSION;  Surgeon: Loni Soyla LABOR, MD;  Location: [facility] ENDOSCOPY;  Service: Cardiovascular;  Laterality: N/A;   CARDIOVERSION N/A 11/24/2022   Procedure: CARDIOVERSION;  Surgeon: Sheena Pugh, DO;  Location: [facility] INVASIVE CV LAB;  Service: Cardiovascular;  Laterality: N/A;   CAROTID ENDARTERECTOMY Right 01   carotidectomy  2003   right side   COLONOSCOPY  07/2010   CORONARY ARTERY BYPASS GRAFT  02/2009   3 vessel   ELECTROPHYSIOLOGIC STUDY  05/21/14   ELECTROPHYSIOLOGY STUDY N/A 05/21/2014   Procedure: ELECTROPHYSIOLOGY STUDY;  Surgeon: Elspeth JAYSON Sage, MD;  Location: [facility] CATH LAB;  Service: Cardiovascular;  Laterality: N/A;   EP IMPLANTABLE DEVICE  05/21/14   single chamber Metronic ICD   IR THORACENTESIS ASP PLEURAL SPACE W/IMG GUIDE  12/07/2021   LEFT HEART CATHETERIZATION WITH CORONARY/GRAFT ANGIOGRAM N/A 05/19/2014   Procedure: LEFT HEART CATHETERIZATION WITH EL BILE;  Surgeon: Maude JAYSON Emmer, MD;  Location: [facility] CATH LAB;  Service: Cardiovascular;  Laterality: N/A;   PLEURAL BIOPSY N/A 11/27/2014   Procedure: PLEURAL BIOPSY;  Surgeon: Elspeth JAYSON Millers, MD;  Location: [facility] OR;  Service: Thoracic;  Laterality: N/A;   PLEURAL EFFUSION DRAINAGE Right 11/27/2014   Procedure: DRAINAGE OF PLEURAL EFFUSION;  Surgeon: Elspeth JAYSON Millers, MD;  Location: [facility] OR;  Service: Thoracic;  Laterality: Right;   TALC  PLEURODESIS Right 11/27/2014   Procedure: TALC  PLEURADESIS;  Surgeon: Elspeth JAYSON Millers, MD;  Location: [facility] OR;  Service: Thoracic;  Laterality: Right;   VIDEO ASSISTED THORACOSCOPY Right 11/27/2014   Procedure: RIGHT VIDEO ASSISTED THORACOSCOPY;  Surgeon: Elspeth JAYSON Millers, MD;  Location: [facility] OR;  Service: Thoracic;  Laterality: Right;    Current Outpatient Medications  Medication Sig Dispense Refill   aspirin  EC 81 MG tablet Take 1 tablet (81 mg total) by mouth daily. Swallow whole.     atorvastatin  (LIPITOR ) 80 MG tablet TAKE 1 TABLET BY MOUTH ONCE DAILY  AT  6PM 90 tablet 3   diphenhydramine -acetaminophen  (TYLENOL  PM) 25-500 MG TABS tablet Take 1 tablet by mouth at bedtime as needed (sleep).     empagliflozin  (JARDIANCE ) 25 MG TABS tablet Take 1 tablet (25 mg total) by mouth daily before breakfast. 90 tablet 3   ezetimibe  (ZETIA ) 10 MG tablet Take 1 tablet by mouth once daily 90 tablet 3   furosemide  (LASIX ) 20 MG tablet TAKE 1 TABLET BY MOUTH ONCE DAILY AS NEEDED FOR SWELLING. 90 tablet 2   meclizine  (ANTIVERT ) 25 MG tablet Take 25 mg by mouth 3 (three) times daily as needed for dizziness.     metoprolol  succinate (TOPROL  XL) 50 MG 24 hr tablet Take 50 mg every night 90 tablet 3   nilotinib  (TASIGNA ) 50 MG capsule Take 2 capsules (100 mg total) by mouth every 12 (twelve) hours. Take on empty stomach, at least one hour before or two hours after food. (Patient taking differently: Take 50 mg by mouth every 12 (twelve) hours.  Take on empty stomach, at least one hour before or two hours after food.) 360 capsule 0   nitroGLYCERIN  (NITROSTAT ) 0.4 MG SL tablet Place 0.4 mg under the tongue every 5 (five) minutes as needed for chest pain.     No current facility-administered medications for this visit.    Allergies:   Breztri aerosphere [budeson-glycopyrrol-formoterol], Norvasc [amlodipine besylate], and Sulfa antibiotics   Social History:  The patient  reports that he quit smoking about 26 years ago. His smoking use included cigarettes. He started smoking about 71 years ago. He has a 135 pack-year smoking history. He has been exposed to tobacco smoke. He has quit using smokeless tobacco. He reports that he does not drink alcohol and does not use drugs.   Family History:  The patient's family history includes Alcohol abuse in his father; Heart disease in his mother.  ROS:  Please see the history of present illness.  All other systems are reviewed and otherwise negative.   PHYSICAL EXAM:  VS:  There were no vitals taken for this visit. BMI: There is no  height or weight on file to calculate BMI. Well nourished, well developed, in no acute distress  HEENT: normocephalic, atraumatic  Neck: no JVD, carotid bruits or masses Cardiac: *** RRR; no significant murmurs, no rubs, or gallops Lungs: *** CTA b/l, no wheezing, rhonchi or rales  Abd: soft, nontender MS: no deformity, age appropriate atrophy Ext: *** no edema  Skin: warm and dry, no rash Neuro:  No gross deficits appreciated Psych: euthymic mood, full affect  *** ICD site is stable, no tethering or discomfort   EKG:   not done today   ICD interrogation done today and reviewed by myself:  *** Battery and lead measurements are good ***     03/14/23: TTE 1. Left ventricular ejection fraction, by estimation, is 45 to 50%. Left  ventricular ejection fraction by 3D volume is 50 %. The left ventricle has  mildly decreased function. The left ventricle demonstrates regional wall  motion abnormalities (see  scoring diagram/findings for description). There is mild concentric left  ventricular hypertrophy. Left ventricular diastolic parameters are  consistent with Grade I diastolic dysfunction (impaired relaxation). There  is moderate hypokinesis of the left  ventricular, entire inferior wall.   2. Right ventricular systolic function is normal. The right ventricular  size is normal. Tricuspid regurgitation signal is inadequate for assessing  PA pressure.   3. Left atrial size was moderately dilated.   4. Right atrial size was moderately dilated.   5. The mitral valve is normal in structure. Mild mitral valve  regurgitation.   6. The aortic valve is tricuspid. Aortic valve regurgitation is not  visualized.   7. There is borderline dilatation of the aortic root, measuring 38 mm.   8. The inferior vena cava is normal in size with greater than 50%  respiratory variability, suggesting right atrial pressure of 3 mmHg.   Comparison(s): No significant change from prior study. Prior images   reviewed side by side.   01/06/23: EPS/ablation (Dr. Waddell) Conclusion: successful EP study and catheter ablation of the atrial flutter isthmus.   05/28/2020: TTE IMPRESSIONS   1. Left ventricular ejection fraction, by estimation, is 40 to 45%. The  left ventricle has mildly decreased function. The left ventricle  demonstrates global hypokinesis. There is moderate left ventricular  hypertrophy. Left ventricular diastolic  parameters are indeterminate.   2. Right ventricular systolic function is normal. The right ventricular  size is normal.  There is normal pulmonary artery systolic pressure.   3. Left atrial size was severely dilated.   4. The mitral valve is normal in structure. Mild mitral valve  regurgitation. No evidence of mitral stenosis.   5. The aortic valve is tricuspid. Aortic valve regurgitation is mild. No  aortic stenosis is present.   6. The inferior vena cava is normal in size with greater than 50%  respiratory variability, suggesting right atrial pressure of 3 mmHg.    04/13/17: lexiscan  stress test Nuclear stress EF: 35%. Inferior/inferoseptal defect consistent iwht scar No significant ischemia Intermediate risk study  06/07/16: TTE Study Conclusions - Left ventricle: Global longitudinal LV strain is abnormal at -12%   The cavity size was normal. There was moderate focal basal and   mild concentric hypertrophy. Systolic function was mildly   reduced. The estimated ejection fraction was in the range of 45%   to 50%. There is akinesis of the basal-midinferior myocardium. - Aortic valve: Mildly to moderately calcified annulus. Trileaflet;   mildly thickened, mildly calcified leaflets. - Aorta: Aortic root dimension: 38 mm (ED). - Aortic root: The aortic root was mildly dilated. - Mitral valve: There was trivial regurgitation. - Left atrium: The atrium was mildly to moderately dilated. - Right ventricle: The cavity size was mildly dilated. Wall   thickness was  normal. - Tricuspid valve: There was trivial regurgitation.   11/14/16: Carotid US  IMPRESSION: 1. Surgical changes of prior right internal carotid endarterectomy with mild residual heterogeneous atherosclerotic plaque. By peak systolic velocity criteria, the estimated stenosis falls in the 1 - 49% range. 2. 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 3. The vertebral arteries are patent with normal antegrade flow bilaterally.     Recent Labs: 03/04/2024: ALT 7; BUN 17; Creatinine, Ser 0.91; Hemoglobin 12.2; Platelets 199; Potassium 4.2; Sodium 142 03/12/2024: Magnesium 2.2  09/18/2023: Cholesterol 104; HDL 38; LDL Cholesterol (Calc) 48; Total CHOL/HDL Ratio 2.7; Triglycerides 94   CrCl cannot be calculated (Patient's most recent lab result is older than the maximum 21 days allowed.).   Wt Readings from Last 3 Encounters:  03/12/24 179 lb 6.4 oz (81.4 kg)  03/06/24 182 lb 6.4 oz (82.7 kg)  01/17/24 180 lb (81.6 kg)     Other studies reviewed: Additional studies/records reviewed today include: summarized above  ASSESSMENT AND PLAN:  1. ICD     *** Intact function     *** no changes made   2. VT      Appropriate ATP      ***      3. ICM, chronic CHF (systolic)     *** No symptoms or exam findings of volume OL     *** OptiVol well below threshold     On BB, Entresto , Jardiance , PRN diuretic     C/w Dr. Audria  4. CAD     No ischemia on 2018 stress testing     *** No anginal complaints     on Plavix  with CAD/PVD, BB, statin tx     C/w Dr. Glenna  5. HTN     ***     Disposition: ***   Current medicines are reviewed at length with the patient today.  The patient did not have any concerns regarding medicines.  Bonney Charlies Arthur, PA-C 03/31/2024 3:43 PM     [facility] [address] [place] [address] [phone] (office)   [phone] (fax)

## 2024-04-03 ENCOUNTER — Encounter: Payer: Self-pay | Admitting: Physician Assistant

## 2024-04-03 ENCOUNTER — Ambulatory Visit: Attending: Cardiovascular Disease | Admitting: Physician Assistant

## 2024-04-03 ENCOUNTER — Ambulatory Visit: Payer: Self-pay | Admitting: Cardiology

## 2024-04-03 VITALS — BP 130/72 | HR 66 | Ht 69.0 in | Wt 182.2 lb

## 2024-04-03 DIAGNOSIS — I10 Essential (primary) hypertension: Secondary | ICD-10-CM

## 2024-04-03 DIAGNOSIS — I472 Ventricular tachycardia, unspecified: Secondary | ICD-10-CM | POA: Diagnosis not present

## 2024-04-03 DIAGNOSIS — I255 Ischemic cardiomyopathy: Secondary | ICD-10-CM

## 2024-04-03 DIAGNOSIS — I2510 Atherosclerotic heart disease of native coronary artery without angina pectoris: Secondary | ICD-10-CM | POA: Diagnosis not present

## 2024-04-03 DIAGNOSIS — Z95810 Presence of automatic (implantable) cardiac defibrillator: Secondary | ICD-10-CM | POA: Diagnosis not present

## 2024-04-03 LAB — CUP PACEART INCLINIC DEVICE CHECK
Battery Remaining Longevity: 21 mo
Battery Voltage: 2.93 V
Brady Statistic RV Percent Paced: 0.04 %
Date Time Interrogation Session: 20250827093831
HighPow Impedance: 66 Ohm
Implantable Lead Connection Status: 753985
Implantable Lead Implant Date: 20151014
Implantable Lead Location: 753860
Implantable Pulse Generator Implant Date: 20151014
Lead Channel Impedance Value: 399 Ohm
Lead Channel Impedance Value: 456 Ohm
Lead Channel Pacing Threshold Amplitude: 0.625 V
Lead Channel Pacing Threshold Pulse Width: 0.4 ms
Lead Channel Sensing Intrinsic Amplitude: 4 mV
Lead Channel Sensing Intrinsic Amplitude: 4.625 mV
Lead Channel Setting Pacing Amplitude: 2 V
Lead Channel Setting Pacing Pulse Width: 0.4 ms
Lead Channel Setting Sensing Sensitivity: 0.3 mV
Zone Setting Status: 755011

## 2024-04-03 NOTE — Patient Instructions (Signed)
 Medication Instructions:   Your physician recommends that you continue on your current medications as directed. Please refer to the Current Medication list given to you today.   *If you need a refill on your cardiac medications before your next appointment, please call your pharmacy*   Lab Work: NONE ORDERED  TODAY    If you have labs (blood work) drawn today and your tests are completely normal, you will receive your results only by: MyChart Message (if you have MyChart) OR A paper copy in the mail If you have any lab test that is abnormal or we need to change your treatment, we will call you to review the results.   Testing/Procedures:NONE ORDERED  TODAY     Follow-Up: At [facility], you and your health needs are our priority.  As part of our continuing mission to provide you with exceptional heart care, our providers are all part of one team.  This team includes your primary Cardiologist (physician) and Advanced Practice Providers or APPs (Physician Assistants and Nurse Practitioners) who all work together to provide you with the care you need, when you need it.  Your next appointment:   2 month(s)  Provider:   You may see  Dr Inocencio or one of the following Advanced Practice Providers on your designated Care Team:   Charlies Arthur, [place]  We recommend signing up for the patient portal called MyChart.  Sign up information is provided on this After Visit Summary.  MyChart is used to connect with patients for Virtual Visits (Telemedicine).  Patients are able to view lab/test results, encounter notes, upcoming appointments, etc.  Non-urgent messages can be sent to your provider as well.   To learn more about what you can do with MyChart, go to [email or web address].   Other Instructions

## 2024-04-22 ENCOUNTER — Ambulatory Visit: Admitting: Internal Medicine

## 2024-05-13 NOTE — Progress Notes (Signed)
 Remote ICD Transmission

## 2024-05-27 ENCOUNTER — Ambulatory Visit (INDEPENDENT_AMBULATORY_CARE_PROVIDER_SITE_OTHER): Payer: Medicare HMO

## 2024-05-27 DIAGNOSIS — I255 Ischemic cardiomyopathy: Secondary | ICD-10-CM

## 2024-05-28 LAB — CUP PACEART REMOTE DEVICE CHECK
Battery Remaining Longevity: 11 mo
Battery Voltage: 2.92 V
Brady Statistic RV Percent Paced: 0.23 %
Date Time Interrogation Session: 20251020001803
HighPow Impedance: 61 Ohm
Implantable Lead Connection Status: 753985
Implantable Lead Implant Date: 20151014
Implantable Lead Location: 753860
Implantable Pulse Generator Implant Date: 20151014
Lead Channel Impedance Value: 418 Ohm
Lead Channel Impedance Value: 418 Ohm
Lead Channel Pacing Threshold Amplitude: 0.625 V
Lead Channel Pacing Threshold Pulse Width: 0.4 ms
Lead Channel Sensing Intrinsic Amplitude: 3.875 mV
Lead Channel Sensing Intrinsic Amplitude: 3.875 mV
Lead Channel Setting Pacing Amplitude: 2 V
Lead Channel Setting Pacing Pulse Width: 0.4 ms
Lead Channel Setting Sensing Sensitivity: 0.3 mV
Zone Setting Status: 755011

## 2024-05-31 NOTE — Progress Notes (Signed)
 Remote ICD Transmission

## 2024-06-03 ENCOUNTER — Encounter: Admitting: Cardiology

## 2024-06-03 ENCOUNTER — Inpatient Hospital Stay: Attending: Hematology and Oncology

## 2024-06-03 ENCOUNTER — Ambulatory Visit: Payer: Self-pay | Admitting: Cardiology

## 2024-06-03 DIAGNOSIS — C9211 Chronic myeloid leukemia, BCR/ABL-positive, in remission: Secondary | ICD-10-CM | POA: Diagnosis present

## 2024-06-03 DIAGNOSIS — C9210 Chronic myeloid leukemia, BCR/ABL-positive, not having achieved remission: Secondary | ICD-10-CM

## 2024-06-03 LAB — CBC WITH DIFFERENTIAL/PLATELET
Abs Immature Granulocytes: 0.05 K/uL (ref 0.00–0.07)
Basophils Absolute: 0.1 K/uL (ref 0.0–0.1)
Basophils Relative: 2 %
Eosinophils Absolute: 0.5 K/uL (ref 0.0–0.5)
Eosinophils Relative: 6 %
HCT: 37.2 % — ABNORMAL LOW (ref 39.0–52.0)
Hemoglobin: 12.3 g/dL — ABNORMAL LOW (ref 13.0–17.0)
Immature Granulocytes: 1 %
Lymphocytes Relative: 18 %
Lymphs Abs: 1.6 K/uL (ref 0.7–4.0)
MCH: 30.4 pg (ref 26.0–34.0)
MCHC: 33.1 g/dL (ref 30.0–36.0)
MCV: 91.9 fL (ref 80.0–100.0)
Monocytes Absolute: 0.7 K/uL (ref 0.1–1.0)
Monocytes Relative: 8 %
Neutro Abs: 5.8 K/uL (ref 1.7–7.7)
Neutrophils Relative %: 65 %
Platelets: 241 K/uL (ref 150–400)
RBC: 4.05 MIL/uL — ABNORMAL LOW (ref 4.22–5.81)
RDW: 14.2 % (ref 11.5–15.5)
WBC: 8.7 K/uL (ref 4.0–10.5)
nRBC: 0 % (ref 0.0–0.2)

## 2024-06-03 LAB — COMPREHENSIVE METABOLIC PANEL WITH GFR
ALT: 9 U/L (ref 0–44)
AST: 13 U/L — ABNORMAL LOW (ref 15–41)
Albumin: 3.7 g/dL (ref 3.5–5.0)
Alkaline Phosphatase: 86 U/L (ref 38–126)
Anion gap: 5 (ref 5–15)
BUN: 18 mg/dL (ref 8–23)
CO2: 28 mmol/L (ref 22–32)
Calcium: 9.2 mg/dL (ref 8.9–10.3)
Chloride: 109 mmol/L (ref 98–111)
Creatinine, Ser: 1.02 mg/dL (ref 0.61–1.24)
GFR, Estimated: >60 mL/min (ref >60)
Glucose, Bld: 105 mg/dL — ABNORMAL HIGH (ref 70–99)
Potassium: 4.9 mmol/L (ref 3.5–5.1)
Sodium: 142 mmol/L (ref 135–145)
Total Bilirubin: 0.5 mg/dL (ref 0.0–1.2)
Total Protein: 6.8 g/dL (ref 6.5–8.1)

## 2024-06-07 ENCOUNTER — Other Ambulatory Visit: Payer: Self-pay | Admitting: Hematology and Oncology

## 2024-06-07 ENCOUNTER — Other Ambulatory Visit: Payer: Self-pay

## 2024-06-07 LAB — BCR-ABL1, CML/ALL, PCR, QUANT
E1A2 Transcript: <0.0032 %
Interpretation (BCRAL):: NEGATIVE
b2a2 transcript: <0.0032 %
b3a2 transcript: <0.0032 %

## 2024-06-07 MED ORDER — NILOTINIB HCL 50 MG PO CAPS: 100.0000 mg | ORAL_CAPSULE | Freq: Two times a day (BID) | ORAL | 1 refills | Status: AC

## 2024-06-10 ENCOUNTER — Encounter: Admitting: Physician Assistant

## 2024-06-13 ENCOUNTER — Encounter: Payer: Self-pay | Admitting: Hematology and Oncology

## 2024-06-13 ENCOUNTER — Inpatient Hospital Stay: Attending: Hematology and Oncology | Admitting: Hematology and Oncology

## 2024-06-13 VITALS — BP 154/64 | HR 72 | Temp 97.4°F | Resp 18 | Ht 69.0 in | Wt 182.8 lb

## 2024-06-13 DIAGNOSIS — C9210 Chronic myeloid leukemia, BCR/ABL-positive, not having achieved remission: Secondary | ICD-10-CM

## 2024-06-13 DIAGNOSIS — D6481 Anemia due to antineoplastic chemotherapy: Secondary | ICD-10-CM | POA: Diagnosis not present

## 2024-06-13 DIAGNOSIS — T451X5A Adverse effect of antineoplastic and immunosuppressive drugs, initial encounter: Secondary | ICD-10-CM | POA: Insufficient documentation

## 2024-06-13 DIAGNOSIS — C9211 Chronic myeloid leukemia, BCR/ABL-positive, in remission: Secondary | ICD-10-CM | POA: Diagnosis present

## 2024-06-13 NOTE — Assessment & Plan Note (Addendum)
This is likely anemia of chronic disease. The patient denies recent history of bleeding such as epistaxis, hematuria or hematochezia. He is asymptomatic from the anemia. We will observe for now.  

## 2024-06-13 NOTE — Progress Notes (Signed)
  [facility] OFFICE PROGRESS NOTE  Patient Care Team: [name], MD as PCP - General (Family Medicine) Wendel Lurena POUR, MD as PCP - Cardiology (Cardiology) Waddell Danelle ORN, MD as PCP - Electrophysiology (Cardiology) Malva Norleen DEL., MD as Attending Physician (Cardiology) [name], MD (Family Medicine) Kerrin Elspeth BROCKS, MD (Cardiothoracic Surgery) Early, Krystal FALCON, MD (Inactive) as Attending Physician (Vascular Surgery) Lonn Hicks, MD as Consulting Physician (Hematology and Oncology) Fernande Elspeth BROCKS, MD (Inactive) as Consulting Physician (Cardiology) Nicholaus Sherlean CROME, [facility] (Inactive) as Pharmacist (Pharmacist) Nicholaus, Sherlean CROME, [facility] (Inactive) as Pharmacist (Pharmacist) [facility]  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  He was originally treated with Dasatinib  but by 2016, had multiple complications due to recurrent pleural effusion.  His treatment was switched to bosutinib from 2016-2019.  In 2020, he has detectable BCR/ABL and his treatment is subsequently switched to nilotinib  The patient has complete major molecular response since switched to nilotinib   I have reviewed test results with the patient and his wife He is compliant taking his medication as directed and tolerated treatment very well with no side effects His recent molecular test showed undetectable BCR/ABL; he is still in major molecular response For now, we will continue close monitoring and follow-up in 3 months We will complete paperwork to apply for assistant to pay for his medications today 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 The patient denies recent history of bleeding such as epistaxis, hematuria or hematochezia. He is asymptomatic from the anemia. We will observe for now.  No orders of the defined types were placed in this encounter.     Hicks Lonn, MD  INTERVAL HISTORY: he returns for surveillance follow-up for myeloproliferative disorder/neoplasm Patient denies recent bleeding such as epistaxis, hematuria or hematochezia No recent infection We reviewed medication list and discussed medication changes We discussed test results and future plan of care as outlined above  He brought with him some paperwork to help apply for assistance to pay for his medications today  PHYSICAL EXAMINATION: ECOG PERFORMANCE STATUS: 0 - Asymptomatic  Vitals:   06/13/24 0851  BP: (!) 154/64  Pulse: 72  Resp: 18  Temp: (!) 97.4 F (36.3 C)  SpO2: 97%   Lab Results  Component Value Date   WBC 8.7 06/03/2024   HGB 12.3 (L) 06/03/2024   HCT 37.2 (L) 06/03/2024   MCV 91.9 06/03/2024   PLT 241 06/03/2024

## 2024-06-13 NOTE — Assessment & Plan Note (Addendum)
 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  He was originally treated with Dasatinib  but by 2016, had multiple complications due to recurrent pleural effusion.  His treatment was switched to bosutinib from 2016-2019.  In 2020, he has detectable BCR/ABL and his treatment is subsequently switched to nilotinib  The patient has complete major molecular response since switched to nilotinib   I have reviewed test results with the patient and his wife He is compliant taking his medication as directed and tolerated treatment very well with no side effects His recent molecular test showed undetectable BCR/ABL; he is still in major molecular response For now, we will continue close monitoring and follow-up in 3 months We will complete paperwork to apply for assistant to pay for his medications today

## 2024-06-14 NOTE — Progress Notes (Signed)
 Cardiology Office Note Date:  06/14/2024  Patient ID:  Anthony Wolfe, Anthony Wolfe [birth year], MRN [id number] PCP:  [name], MD  Electrophysiologist:  Dr. Fernande (inactive) >> Dr. Inocencio Cardiologist: Dr. Wendel Pulmonary: Dr. Shellia     Chief Complaint:     planned f/u  History of Present Illness: Anthony Wolfe is a 78 y.o. male with history of  VT (+EP inducible VT) w/ICD,  CAD (CABG 2010),  PVD (R CEA, known occluded L ICA),  chronic CHF (systolic), ICM,  CML (follows with oncology), emphysema,  HTN, HLD, CVA. AFlutter > ablated  He saw Dr. Wendel 08/17/23, had recent URI (COPD exacerbation), saw his PMD reported an irregular heart rhythm At this visit, feeling better, good exertional capacity, had been hunting, did well. + for some orthostatic symptoms, SR on his EKG  I saw him 12/11/23 He is accompanied by his wife No recurrent URI/COPD exacerbations since he saw Dr. Wendel, thank goodness, this he says is his real limiter. Denies any CP, palpitations or cardiac awareness No unusual SOB No near syncope or syncope No shocks Rare NSVT No changes made  Remote noted ATP delivered in June and advised labs and an appt Pt denied any symptoms or missed meds EGM c/w VT > ATP > dirty break  7/28: K+ 4.2 8/5: Mag 2.2  He saw Dr. Wendel 03/06/24, reports orthostatic sounding symptoms, had stopped his powerade No discussion on ATP/VT Working as a curator Drinks orange soda mostly C/w heme for his CML Coreg  was changed to Toprol  2/2 lightheadedness  I saw him 04/03/24 He is accompanied by his wife. He feels much better on metoprolol . Dizziness was not isolated to day of VT > but nearly every time he stood up > since the medication change this is resolved. They report that just prior to the VT episode, he had started drinking Powerade energy drinks (one daily) and thinks maybe that was the cause > has stopped them He was unaware of the event No CP, SOB Denies  DOE Reports excellent medication compliance No near syncope or syncope No further sustained/treated VT noted on Toprol  and off energy drink No AAD initiated  TODAY  He is accompanied by his wife. Doing well Always outside working on something, currently blowing and taking care of the leaves. He reports mild DOE with hills, otherwise excellent exertional capacity No CP Rare little flutter in his heart No rest SOB, or DOE with his usual activities. Orthostatic dizziness remains resolved No near syncope or syncope    Device information: MDT single chamber ICD, implanted 05/21/14, Dr. Fernande  Arrhythmia/AAD:  VT: + inducible VT on EP mexiletine started Sept 2018 >> stopped April 2023 (unclear > seems associated with the time AFib was discovered, note reports insurance declined) ? Intolerant of amio  AFib discussed 2023 Subsequently EP Dr. 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 difficile colitis 06/16/2014   CAD (coronary artery disease)    a. s/p 3v CABG 2010. b. NSTEMI 05/2014 in setting of VT. Cath impressions: Recent IMI with occluded SVG to RCA. Has Right to Right collaterals and collaterals from septal and OM. EF 40%.   Carotid disease, bilateral    a. Right CEA 2002; known  occluded left carotid. b. Duplex 06/2014: known occluded LICA, stable 1-39% RICA s/p CEA.   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 Prior EF 36%. b. 2014: 50%. c. 05/2014: EF 35-40% by echo, 40% with inf HK by cath.   NSTEMI (non-ST elevation myocardial  infarction) (HCC) 05/08/2014   Pleural effusion 05/08/2014   Pneumonia 05/23/2014   Presence of permanent cardiac pacemaker    Prostate hypertrophy    PVC's (premature ventricular contractions)    a. PVC's with prior Holter showing PVC load of 22%.   Shortness of breath dyspnea    Stroke (HCC) 08/09/1999   TIA (transient ischemic attack)    ASPVD, S./P. right CEA, 2002, and known occluded left carotid   Ventricular tachycardia (HCC)    a. Admitted with VT 05/2013 - EPS with inducible sustained monomorphic VT; s/p Medtronic ICD 05/21/14.    Past Surgical History:  Procedure Laterality Date   A-FLUTTER ABLATION N/A 01/06/2023   Procedure: A-FLUTTER ABLATION;  Surgeon: Waddell Danelle ORN, MD;  Location: [facility] INVASIVE CV LAB;  Service: Cardiovascular;  Laterality: N/A;   BACK SURGERY     CARDIOVERSION N/A 01/04/2022   Procedure: CARDIOVERSION;  Surgeon: Loni Soyla LABOR, MD;  Location: [facility] ENDOSCOPY;  Service: Cardiovascular;  Laterality: N/A;   CARDIOVERSION N/A 11/24/2022   Procedure: CARDIOVERSION;  Surgeon: Sheena Pugh, DO;  Location: [facility] INVASIVE CV LAB;  Service: Cardiovascular;  Laterality: N/A;   CAROTID ENDARTERECTOMY Right 01   carotidectomy  2003   right side   COLONOSCOPY  07/2010   CORONARY ARTERY BYPASS GRAFT  02/2009   3 vessel   ELECTROPHYSIOLOGIC STUDY  05/21/14   ELECTROPHYSIOLOGY STUDY N/A 05/21/2014   Procedure: ELECTROPHYSIOLOGY STUDY;  Surgeon: Elspeth JAYSON Sage, MD;  Location: [facility] CATH LAB;  Service: Cardiovascular;  Laterality: N/A;   EP IMPLANTABLE DEVICE  05/21/14   single chamber Metronic ICD   IR THORACENTESIS ASP PLEURAL SPACE W/IMG GUIDE  12/07/2021   LEFT HEART CATHETERIZATION WITH CORONARY/GRAFT ANGIOGRAM N/A 05/19/2014   Procedure: LEFT HEART CATHETERIZATION WITH EL BILE;  Surgeon: Maude JAYSON Emmer, MD;  Location: [facility] CATH LAB;  Service: Cardiovascular;  Laterality: N/A;   PLEURAL BIOPSY N/A 11/27/2014   Procedure: PLEURAL BIOPSY;  Surgeon: Elspeth JAYSON Millers, MD;  Location: [facility] OR;  Service: Thoracic;  Laterality: N/A;   PLEURAL EFFUSION DRAINAGE Right 11/27/2014   Procedure: DRAINAGE OF PLEURAL EFFUSION;  Surgeon: Elspeth JAYSON Millers, MD;  Location: [facility] OR;  Service: Thoracic;  Laterality: Right;   TALC  PLEURODESIS Right 11/27/2014   Procedure: TALC  PLEURADESIS;  Surgeon: Elspeth JAYSON Millers, MD;  Location: [facility] OR;  Service: Thoracic;  Laterality: Right;   VIDEO ASSISTED THORACOSCOPY Right 11/27/2014   Procedure: RIGHT VIDEO ASSISTED THORACOSCOPY;  Surgeon: Elspeth JAYSON Millers, MD;  Location: [facility] OR;  Service: Thoracic;  Laterality: Right;    Current Outpatient Medications  Medication Sig Dispense Refill   aspirin  EC 81 MG tablet Take 1 tablet (81 mg total) by mouth daily. Swallow whole.     atorvastatin  (LIPITOR ) 80 MG tablet TAKE 1 TABLET BY MOUTH ONCE DAILY AT  6PM 90 tablet 3   diphenhydramine -acetaminophen  (TYLENOL  PM) 25-500 MG TABS tablet Take 1 tablet by mouth at bedtime as needed (sleep).  empagliflozin  (JARDIANCE ) 25 MG TABS tablet Take 1 tablet (25 mg total) by mouth daily before breakfast. 90 tablet 3   ezetimibe  (ZETIA ) 10 MG tablet Take 1 tablet by mouth once daily 90 tablet 3   furosemide  (LASIX ) 20 MG tablet TAKE 1 TABLET BY MOUTH ONCE DAILY AS NEEDED FOR SWELLING. 90 tablet 2   meclizine  (ANTIVERT ) 25 MG tablet Take 25 mg by mouth 3 (three) times daily as needed for dizziness.     metoprolol  succinate (TOPROL  XL) 50 MG 24 hr tablet Take 50 mg every night 90 tablet 3   nilotinib  (TASIGNA ) 50 MG capsule Take 2 capsules (100 mg total) by mouth every 12 (twelve) hours. Take on empty stomach, at least one hour before or two hours after food. 360 capsule 1   nitroGLYCERIN  (NITROSTAT ) 0.4 MG SL tablet Place 0.4 mg under the tongue every 5 (five) minutes as needed for chest pain.     No current facility-administered medications for this visit.    Allergies:   Breztri aerosphere [budeson-glycopyrrol-formoterol], Norvasc  [amlodipine besylate], and Sulfa antibiotics   Social History:  The patient  reports that he quit smoking about 26 years ago. His smoking use included cigarettes. He started smoking about 71 years ago. He has a 135 pack-year smoking history. He has been exposed to tobacco smoke. He has quit using smokeless tobacco. He reports that he does not drink alcohol and does not use drugs.   Family History:  The patient's family history includes Alcohol abuse in his father; Heart disease in his mother.  ROS:  Please see the history of present illness.  All other systems are reviewed and otherwise negative.   PHYSICAL EXAM:  VS:  There were no vitals taken for this visit. BMI: There is no height or weight on file to calculate BMI. Well nourished, well developed, in no acute distress  HEENT: normocephalic, atraumatic  Neck: no JVD, carotid bruits or masses Cardiac: RRR; no significant murmurs, no rubs, or gallops Lungs: CTA b/l, no wheezing, rhonchi or rales  Abd: soft, nontender MS: no deformity, age appropriate atrophy Ext: no edema  Skin: warm and dry, no rash Neuro:  No gross deficits appreciated Psych: euthymic mood, full affect  ICD site is stable, no tethering or discomfort   EKG:   not done today   Limited ICD interrogation done today and reviewed by myself:  Battery and auto lead measurements are good 3 NSVTs. One yesterday by morphology appears more c/w a brief SVT Oct episodes true NSVT No treated or sustained episodes     03/14/23: TTE 1. Left ventricular ejection fraction, by estimation, is 45 to 50%. Left  ventricular ejection fraction by 3D volume is 50 %. The left ventricle has  mildly decreased function. The left ventricle demonstrates regional wall  motion abnormalities (see  scoring diagram/findings for description). There is mild concentric left  ventricular hypertrophy. Left ventricular diastolic parameters are  consistent with Grade I diastolic dysfunction  (impaired relaxation). There  is moderate hypokinesis of the left  ventricular, entire inferior wall.   2. Right ventricular systolic function is normal. The right ventricular  size is normal. Tricuspid regurgitation signal is inadequate for assessing  PA pressure.   3. Left atrial size was moderately dilated.   4. Right atrial size was moderately dilated.   5. The mitral valve is normal in structure. Mild mitral valve  regurgitation.   6. The aortic valve is tricuspid. Aortic valve regurgitation is not  visualized.  7. There is borderline dilatation of the aortic root, measuring 38 mm.   8. The inferior vena cava is normal in size with greater than 50%  respiratory variability, suggesting right atrial pressure of 3 mmHg.   Comparison(s): No significant change from prior study. Prior images  reviewed side by side.   01/06/23: EPS/ablation (Dr. Waddell) Conclusion: successful EP study and catheter ablation of the atrial flutter isthmus.   05/28/2020: TTE IMPRESSIONS   1. Left ventricular ejection fraction, by estimation, is 40 to 45%. The  left ventricle has mildly decreased function. The left ventricle  demonstrates global hypokinesis. There is moderate left ventricular  hypertrophy. Left ventricular diastolic  parameters are indeterminate.   2. Right ventricular systolic function is normal. The right ventricular  size is normal. There is normal pulmonary artery systolic pressure.   3. Left atrial size was severely dilated.   4. The mitral valve is normal in structure. Mild mitral valve  regurgitation. No evidence of mitral stenosis.   5. The aortic valve is tricuspid. Aortic valve regurgitation is mild. No  aortic stenosis is present.   6. The inferior vena cava is normal in size with greater than 50%  respiratory variability, suggesting right atrial pressure of 3 mmHg.    04/13/17: lexiscan  stress test Nuclear stress EF: 35%. Inferior/inferoseptal defect consistent iwht scar  No significant ischemia Intermediate risk study  06/07/16: TTE Study Conclusions - Left ventricle: Global longitudinal LV strain is abnormal at -12%   The cavity size was normal. There was moderate focal basal and   mild concentric hypertrophy. Systolic function was mildly   reduced. The estimated ejection fraction was in the range of 45%   to 50%. There is akinesis of the basal-midinferior myocardium. - Aortic valve: Mildly to moderately calcified annulus. Trileaflet;   mildly thickened, mildly calcified leaflets. - Aorta: Aortic root dimension: 38 mm (ED). - Aortic root: The aortic root was mildly dilated. - Mitral valve: There was trivial regurgitation. - Left atrium: The atrium was mildly to moderately dilated. - Right ventricle: The cavity size was mildly dilated. Wall   thickness was normal. - Tricuspid valve: There was trivial regurgitation.   11/14/16: Carotid US  IMPRESSION: 1. Surgical changes of prior right internal carotid endarterectomy with mild residual heterogeneous atherosclerotic plaque. By peak systolic velocity criteria, the estimated stenosis falls in the 1 - 49% range. 2. 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 3. The vertebral arteries are patent with normal antegrade flow bilaterally.     Recent Labs: 03/12/2024: Magnesium 2.2 06/03/2024: ALT 9; BUN 18; Creatinine, Ser 1.02; Hemoglobin 12.3; Platelets 241; Potassium 4.9; Sodium 142  09/18/2023: Cholesterol 104; HDL 38; LDL Cholesterol (Calc) 48; Total CHOL/HDL Ratio 2.7; Triglycerides 94   Estimated Creatinine Clearance: 57.8 mL/min (by C-G formula based on SCr of 1.02 mg/dL).   Wt Readings from Last 3 Encounters:  06/13/24 182 lb 12.8 oz (82.9 kg)  04/03/24 182 lb 3.2 oz (82.6 kg)  03/12/24 179 lb 6.4 oz (81.4 kg)     Other studies reviewed: Additional studies/records reviewed today include: summarized  above  ASSESSMENT AND PLAN:  1. ICD     Intact function     no changes made   2. VT      NSVT only (Oct, 2 episodes, timed the same time, same day)       Will not revisit AAD yet      Reminded no driving until  after Dec 18th (11mo post treated episode in June)      3. ICM, chronic CHF (systolic)     No symptoms or exam findings of volume OL      OptiVol is on the rise, below threshold     No symptoms or exam findings to support volume, stable weight     On BB, Entresto , Jardiance , PRN diuretic     C/w Dr. Audria  4. CAD     No ischemia on 2018 stress testing     No anginal complaints     on ASA with CAD/PVD, BB, statin tx     C/w Dr. Glenna  5. HTN     Looks good     Disposition: back in 6 mo, sooner if needed   Current medicines are reviewed at length with the patient today.  The patient did not have any concerns regarding medicines.  Bonney Charlies Arthur, PA-C 06/14/2024 9:05 AM     [facility] [address] [place] [address] [phone] (office)  [phone] (fax)

## 2024-06-19 ENCOUNTER — Ambulatory Visit: Attending: Physician Assistant | Admitting: Physician Assistant

## 2024-06-19 VITALS — BP 134/56 | HR 77 | Ht 69.0 in | Wt 182.0 lb

## 2024-06-19 DIAGNOSIS — I472 Ventricular tachycardia, unspecified: Secondary | ICD-10-CM

## 2024-06-19 DIAGNOSIS — I255 Ischemic cardiomyopathy: Secondary | ICD-10-CM | POA: Diagnosis not present

## 2024-06-19 DIAGNOSIS — I10 Essential (primary) hypertension: Secondary | ICD-10-CM

## 2024-06-19 DIAGNOSIS — I2510 Atherosclerotic heart disease of native coronary artery without angina pectoris: Secondary | ICD-10-CM | POA: Diagnosis not present

## 2024-06-19 DIAGNOSIS — Z95810 Presence of automatic (implantable) cardiac defibrillator: Secondary | ICD-10-CM

## 2024-06-19 LAB — CUP PACEART INCLINIC DEVICE CHECK
Battery Remaining Longevity: 19 mo
Battery Voltage: 2.92 V
Brady Statistic RV Percent Paced: 0.28 %
Date Time Interrogation Session: 20251112085913
HighPow Impedance: 57 Ohm
Implantable Lead Connection Status: 753985
Implantable Lead Implant Date: 20151014
Implantable Lead Location: 753860
Implantable Pulse Generator Implant Date: 20151014
Lead Channel Impedance Value: 418 Ohm
Lead Channel Impedance Value: 475 Ohm
Lead Channel Pacing Threshold Amplitude: 0.625 V
Lead Channel Pacing Threshold Pulse Width: 0.4 ms
Lead Channel Sensing Intrinsic Amplitude: 3.125 mV
Lead Channel Sensing Intrinsic Amplitude: 3.125 mV
Lead Channel Setting Pacing Amplitude: 2 V
Lead Channel Setting Pacing Pulse Width: 0.4 ms
Lead Channel Setting Sensing Sensitivity: 0.3 mV
Zone Setting Status: 755011

## 2024-06-19 NOTE — Patient Instructions (Signed)
 Medication Instructions:   Your physician recommends that you continue on your current medications as directed. Please refer to the Current Medication list given to you today.  *If you need a refill on your cardiac medications before your next appointment, please call your pharmacy*  Lab Work: NONE ORDERED  TODAY    If you have labs (blood work) drawn today and your tests are completely normal, you will receive your results only by: MyChart Message (if you have MyChart) OR A paper copy in the mail If you have any lab test that is abnormal or we need to change your treatment, we will call you to review the results.  Testing/Procedures: NONE ORDERED  TODAY    Follow-Up: At [facility], you and your health needs are our priority.  As part of our continuing mission to provide you with exceptional heart care, our providers are all part of one team.  This team includes your primary Cardiologist (physician) and Advanced Practice Providers or APPs (Physician Assistants and Nurse Practitioners) who all work together to provide you with the care you need, when you need it.  Your next appointment:  6 month(s)  Provider:  You may see Danelle Birmingham, MD  Charlies Arthur, PA-C  We recommend signing up for the patient portal called MyChart.  Sign up information is provided on this After Visit Summary.  MyChart is used to connect with patients for Virtual Visits (Telemedicine).  Patients are able to view lab/test results, encounter notes, upcoming appointments, etc.  Non-urgent messages can be sent to your provider as well.   To learn more about what you can do with MyChart, go to [email or web address].   Other Instructions

## 2024-06-25 ENCOUNTER — Ambulatory Visit: Payer: Self-pay | Admitting: Cardiology

## 2024-06-28 ENCOUNTER — Telehealth: Payer: Self-pay

## 2024-06-28 NOTE — Telephone Encounter (Signed)
 Called him back and given message to call in his next 90 day refill to [employer] # [phone] on November 30th. His has enough left of Tasigna  until mid December. He verbalized understanding and will call for refill around 11/30.

## 2024-07-05 ENCOUNTER — Telehealth: Payer: Self-pay

## 2024-07-05 NOTE — Telephone Encounter (Signed)
 Oral Oncology Patient Advocate Encounter   Began application for assistance for Tasigna  50mg  through [employer].   Application submitted upon completion of necessary supporting documentation.   [employer] phone number [phone].   I will continue to check the status until final determination.   Lucie Lamer, CPhT [place]  [facility] Oncology Pharmacy Patient Advocate Specialist [facility] [place] Phone: [phone]  Fax: [phone] [email or web address]

## 2024-07-10 ENCOUNTER — Telehealth: Payer: Self-pay

## 2024-07-10 NOTE — Telephone Encounter (Signed)
 Copied from CRM [id number]. Topic: Clinical - Medication Question >> Jul 10, 2024  2:44 PM Tysheama G wrote: Reason for CRM: Patient is trying to request his medication empagliflozin  (JARDIANCE ) 25 MG TABS tablet and he stated that Dr.Pickard nurse normally orders it for him. I was trying to put the request in but he wasn't sure of the pharmacy and then he mentioned [place]  and that wasn't on his list. Can Dr.Pickard nurse put the request in please.

## 2024-07-11 ENCOUNTER — Telehealth: Payer: Self-pay

## 2024-07-11 NOTE — Telephone Encounter (Signed)
 PAP: Patient assistance application for Jardiance through [employer]) has been mailed to pt's home address on file. Provider portion of application will be faxed to provider's office.

## 2024-07-22 NOTE — Telephone Encounter (Signed)
 Received Provider portion PAP application [employer] Jardiance .

## 2024-07-26 NOTE — Telephone Encounter (Signed)
 PAP: Application for Bernadine has been submitted to [employer]), via fax

## 2024-08-02 ENCOUNTER — Telehealth: Payer: Self-pay

## 2024-08-02 NOTE — Telephone Encounter (Signed)
 PAP reenrollment for year 2026    Application has been submitted for Tasinga through [employer] with both Patient and doctor signatures, along with requested documentation.  Status: Pending

## 2024-08-14 NOTE — Telephone Encounter (Signed)
 Patient received letter from [employer] informing him POI was needed to process application further. Patient took paperwork to office and it was faxed to [facility] to process.

## 2024-08-16 ENCOUNTER — Other Ambulatory Visit: Payer: Self-pay | Admitting: Internal Medicine

## 2024-08-26 ENCOUNTER — Ambulatory Visit: Payer: Medicare HMO

## 2024-08-26 DIAGNOSIS — I472 Ventricular tachycardia, unspecified: Secondary | ICD-10-CM

## 2024-08-28 LAB — CUP PACEART REMOTE DEVICE CHECK
Battery Remaining Longevity: 17 mo
Battery Voltage: 2.92 V
Brady Statistic RV Percent Paced: 0.69 %
Date Time Interrogation Session: 20260119044224
HighPow Impedance: 55 Ohm
Implantable Lead Connection Status: 753985
Implantable Lead Implant Date: 20151014
Implantable Lead Location: 753860
Implantable Pulse Generator Implant Date: 20151014
Lead Channel Impedance Value: 418 Ohm
Lead Channel Impedance Value: 456 Ohm
Lead Channel Pacing Threshold Amplitude: 0.625 V
Lead Channel Pacing Threshold Pulse Width: 0.4 ms
Lead Channel Sensing Intrinsic Amplitude: 3.125 mV
Lead Channel Sensing Intrinsic Amplitude: 3.125 mV
Lead Channel Setting Pacing Amplitude: 2 V
Lead Channel Setting Pacing Pulse Width: 0.4 ms
Lead Channel Setting Sensing Sensitivity: 0.3 mV
Zone Setting Status: 755011

## 2024-08-28 NOTE — Telephone Encounter (Signed)
 PAP: Patient assistance application for Jardiance  has been approved by PAP Companies: [employer] from 08/23/2024 to 08/07/2025. Medication should be delivered to PAP Delivery: Home. For further shipping updates, please contact [employer] ([employer]) at [phone]. Patient ID is: not provided

## 2024-08-29 ENCOUNTER — Ambulatory Visit: Payer: Self-pay | Admitting: Cardiology

## 2024-08-30 ENCOUNTER — Inpatient Hospital Stay: Attending: Hematology and Oncology

## 2024-08-30 DIAGNOSIS — C9211 Chronic myeloid leukemia, BCR/ABL-positive, in remission: Secondary | ICD-10-CM | POA: Insufficient documentation

## 2024-08-30 DIAGNOSIS — C9210 Chronic myeloid leukemia, BCR/ABL-positive, not having achieved remission: Secondary | ICD-10-CM

## 2024-08-30 LAB — CBC WITH DIFFERENTIAL/PLATELET
Abs Immature Granulocytes: 0.05 K/uL (ref 0.00–0.07)
Basophils Absolute: 0.1 K/uL (ref 0.0–0.1)
Basophils Relative: 1 %
Eosinophils Absolute: 0.3 K/uL (ref 0.0–0.5)
Eosinophils Relative: 3 %
HCT: 37.5 % — ABNORMAL LOW (ref 39.0–52.0)
Hemoglobin: 12.2 g/dL — ABNORMAL LOW (ref 13.0–17.0)
Immature Granulocytes: 1 %
Lymphocytes Relative: 16 %
Lymphs Abs: 1.4 K/uL (ref 0.7–4.0)
MCH: 29.8 pg (ref 26.0–34.0)
MCHC: 32.5 g/dL (ref 30.0–36.0)
MCV: 91.7 fL (ref 80.0–100.0)
Monocytes Absolute: 0.6 K/uL (ref 0.1–1.0)
Monocytes Relative: 7 %
Neutro Abs: 6.4 K/uL (ref 1.7–7.7)
Neutrophils Relative %: 72 %
Platelets: 236 K/uL (ref 150–400)
RBC: 4.09 MIL/uL — ABNORMAL LOW (ref 4.22–5.81)
RDW: 14.2 % (ref 11.5–15.5)
WBC: 8.8 K/uL (ref 4.0–10.5)
nRBC: 0 % (ref 0.0–0.2)

## 2024-08-30 LAB — COMPREHENSIVE METABOLIC PANEL WITH GFR
ALT: 11 U/L (ref 0–44)
AST: 17 U/L (ref 15–41)
Albumin: 3.8 g/dL (ref 3.5–5.0)
Alkaline Phosphatase: 96 U/L (ref 38–126)
Anion gap: 10 (ref 5–15)
BUN: 19 mg/dL (ref 8–23)
CO2: 25 mmol/L (ref 22–32)
Calcium: 8.9 mg/dL (ref 8.9–10.3)
Chloride: 107 mmol/L (ref 98–111)
Creatinine, Ser: 1.04 mg/dL (ref 0.61–1.24)
GFR, Estimated: >60 mL/min
Glucose, Bld: 117 mg/dL — ABNORMAL HIGH (ref 70–99)
Potassium: 4.1 mmol/L (ref 3.5–5.1)
Sodium: 142 mmol/L (ref 135–145)
Total Bilirubin: 0.4 mg/dL (ref 0.0–1.2)
Total Protein: 6.9 g/dL (ref 6.5–8.1)

## 2024-08-30 NOTE — Progress Notes (Signed)
 Remote ICD Transmission

## 2024-09-03 ENCOUNTER — Inpatient Hospital Stay

## 2024-09-06 LAB — BCR-ABL1, CML/ALL, PCR, QUANT
E1A2 Transcript: <0.0032 %
Interpretation (BCRAL):: NEGATIVE
b2a2 transcript: <0.0032 %
b3a2 transcript: <0.0032 %

## 2024-09-11 ENCOUNTER — Telehealth: Payer: Self-pay

## 2024-09-11 NOTE — Telephone Encounter (Signed)
 Appointment has been moved from Friday, 2/6, to Thursday, 2/5, due to overbooking.

## 2024-09-12 ENCOUNTER — Inpatient Hospital Stay: Attending: Hematology and Oncology | Admitting: Hematology and Oncology

## 2024-09-12 ENCOUNTER — Encounter: Payer: Self-pay | Admitting: Hematology and Oncology

## 2024-09-12 VITALS — BP 137/61 | HR 77 | Temp 98.2°F | Resp 18 | Ht 69.0 in | Wt 186.3 lb

## 2024-09-12 DIAGNOSIS — C9210 Chronic myeloid leukemia, BCR/ABL-positive, not having achieved remission: Secondary | ICD-10-CM

## 2024-09-12 DIAGNOSIS — D6481 Anemia due to antineoplastic chemotherapy: Secondary | ICD-10-CM

## 2024-09-12 NOTE — Assessment & Plan Note (Addendum)
 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  He was originally treated with Dasatinib  but by 2016, had multiple complications due to recurrent pleural effusion.  His treatment was switched to bosutinib from 2016-2019.  In 2020, he has detectable BCR/ABL and his treatment is subsequently switched to nilotinib  The patient has complete major molecular response since switched to nilotinib   I have reviewed test results with the patient and his wife He is compliant taking his medication as directed and tolerated treatment very well with no side effects His recent molecular test showed undetectable BCR/ABL; he is still in major molecular response For now, we will continue close monitoring and follow-up in 3 months

## 2024-09-12 NOTE — Progress Notes (Signed)
 [facility] OFFICE PROGRESS NOTE  Patient Care Team: [name], MD as PCP - General (Family Medicine) Wendel Lurena POUR, MD as PCP - Cardiology (Cardiology) Waddell Danelle ORN, MD as PCP - Electrophysiology (Cardiology) Malva Norleen DEL., MD as Attending Physician (Cardiology) [name], MD (Family Medicine) Kerrin Elspeth BROCKS, MD (Cardiothoracic Surgery) Early, Krystal FALCON, MD (Inactive) as Attending Physician (Vascular Surgery) Lonn Hicks, MD as Consulting Physician (Hematology and Oncology) Fernande Elspeth BROCKS, MD (Inactive) as Consulting Physician (Cardiology) Nicholaus Sherlean CROME, [facility] (Inactive) as Pharmacist (Pharmacist) Nicholaus, Sherlean CROME, [facility] (Inactive) as Pharmacist (Pharmacist) [facility]  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  He was originally treated with Dasatinib  but by 2016, had multiple complications due to recurrent pleural effusion.  His treatment was switched to bosutinib from 2016-2019.  In 2020, he has detectable BCR/ABL and his treatment is subsequently switched to nilotinib  The patient has complete major molecular response since switched to nilotinib   I have reviewed test results with the patient and his wife He is compliant taking his medication as directed and tolerated treatment very well with no side effects His recent molecular test showed undetectable BCR/ABL; he is still in major molecular response For now, we will continue close monitoring and follow-up in 3 months 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 The patient denies recent history of bleeding such as epistaxis, hematuria or hematochezia. He is asymptomatic from the anemia. We will observe for now.  No orders of the defined types were placed in this encounter.    Hicks Lonn, MD  INTERVAL HISTORY: he returns for surveillance follow-up for  myeloproliferative disorder/neoplasm Patient denies recent bleeding such as epistaxis, hematuria or hematochezia No recent infection We reviewed medication list and discussed medication changes We discussed test results and future plan of care as outlined above  PHYSICAL EXAMINATION: ECOG PERFORMANCE STATUS: 0 - Asymptomatic  Vitals:   09/12/24 1147  BP: 137/61  Pulse: 77  Resp: 18  Temp: 98.2 F (36.8 C)  SpO2: 99%   Lab Results  Component Value Date   WBC 8.8 08/30/2024   HGB 12.2 (L) 08/30/2024   HCT 37.5 (L) 08/30/2024   MCV 91.7 08/30/2024   PLT 236 08/30/2024

## 2024-09-12 NOTE — Assessment & Plan Note (Addendum)
This is likely anemia of chronic disease. The patient denies recent history of bleeding such as epistaxis, hematuria or hematochezia. He is asymptomatic from the anemia. We will observe for now.  

## 2024-09-13 ENCOUNTER — Inpatient Hospital Stay: Admitting: Hematology and Oncology

## 2024-11-25 ENCOUNTER — Ambulatory Visit: Payer: Medicare HMO

## 2024-12-06 ENCOUNTER — Inpatient Hospital Stay: Attending: Hematology and Oncology

## 2024-12-17 ENCOUNTER — Inpatient Hospital Stay: Admitting: Hematology and Oncology

## 2025-01-22 ENCOUNTER — Ambulatory Visit

## 2025-02-24 ENCOUNTER — Ambulatory Visit: Payer: Medicare HMO

## 2025-05-26 ENCOUNTER — Ambulatory Visit: Payer: Medicare HMO

## 2025-08-25 ENCOUNTER — Ambulatory Visit: Payer: Medicare HMO
